# Patient Record
Sex: Male | Born: 1965 | Race: Black or African American | Hispanic: No | Marital: Married | Smoking: Former smoker
Health system: Southern US, Community
[De-identification: ages and names within clinical notes are randomized; demographics above are authoritative.]

## PROBLEM LIST (undated history)

## (undated) DIAGNOSIS — E785 Hyperlipidemia, unspecified: Secondary | ICD-10-CM

## (undated) DIAGNOSIS — J189 Pneumonia, unspecified organism: Secondary | ICD-10-CM

## (undated) DIAGNOSIS — M1990 Unspecified osteoarthritis, unspecified site: Secondary | ICD-10-CM

## (undated) DIAGNOSIS — I509 Heart failure, unspecified: Secondary | ICD-10-CM

## (undated) DIAGNOSIS — Z9289 Personal history of other medical treatment: Secondary | ICD-10-CM

## (undated) DIAGNOSIS — M109 Gout, unspecified: Secondary | ICD-10-CM

## (undated) DIAGNOSIS — R569 Unspecified convulsions: Secondary | ICD-10-CM

## (undated) DIAGNOSIS — D649 Anemia, unspecified: Secondary | ICD-10-CM

## (undated) DIAGNOSIS — S2239XA Fracture of one rib, unspecified side, initial encounter for closed fracture: Secondary | ICD-10-CM

## (undated) DIAGNOSIS — J45909 Unspecified asthma, uncomplicated: Secondary | ICD-10-CM

## (undated) DIAGNOSIS — N186 End stage renal disease: Secondary | ICD-10-CM

## (undated) DIAGNOSIS — A539 Syphilis, unspecified: Secondary | ICD-10-CM

## (undated) DIAGNOSIS — I10 Essential (primary) hypertension: Secondary | ICD-10-CM

## (undated) DIAGNOSIS — A64 Unspecified sexually transmitted disease: Secondary | ICD-10-CM

## (undated) DIAGNOSIS — F419 Anxiety disorder, unspecified: Secondary | ICD-10-CM

## (undated) DIAGNOSIS — Z992 Dependence on renal dialysis: Secondary | ICD-10-CM

## (undated) DIAGNOSIS — J101 Influenza due to other identified influenza virus with other respiratory manifestations: Secondary | ICD-10-CM

## (undated) DIAGNOSIS — Z720 Tobacco use: Secondary | ICD-10-CM

## (undated) DIAGNOSIS — B20 Human immunodeficiency virus [HIV] disease: Secondary | ICD-10-CM

## (undated) HISTORY — DX: Fracture of one rib, unspecified side, initial encounter for closed fracture: S22.39XA

## (undated) HISTORY — DX: Human immunodeficiency virus (HIV) disease: B20

## (undated) HISTORY — DX: Syphilis, unspecified: A53.9

## (undated) HISTORY — DX: Unspecified sexually transmitted disease: A64

## (undated) HISTORY — DX: Unspecified convulsions: R56.9

## (undated) HISTORY — DX: Hyperlipidemia, unspecified: E78.5

## (undated) HISTORY — DX: Essential (primary) hypertension: I10

## (undated) HISTORY — PX: THROMBECTOMY: PRO61

## (undated) HISTORY — DX: Gout, unspecified: M10.9

---
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
## 1995-07-22 DIAGNOSIS — A539 Syphilis, unspecified: Secondary | ICD-10-CM

## 1995-07-22 HISTORY — DX: Syphilis, unspecified: A53.9

## 1996-09-29 ENCOUNTER — Encounter (INDEPENDENT_AMBULATORY_CARE_PROVIDER_SITE_OTHER): Payer: Self-pay | Admitting: *Deleted

## 1996-09-29 LAB — CONVERTED CEMR LAB
CD4 Count: 40 microliters
CD4 T Cell Abs: 40

## 1997-10-27 ENCOUNTER — Encounter: Admission: RE | Admit: 1997-10-27 | Discharge: 1997-10-27 | Payer: Self-pay | Admitting: Internal Medicine

## 1997-12-22 ENCOUNTER — Encounter: Admission: RE | Admit: 1997-12-22 | Discharge: 1997-12-22 | Payer: Self-pay | Admitting: Internal Medicine

## 1997-12-28 ENCOUNTER — Encounter: Admission: RE | Admit: 1997-12-28 | Discharge: 1997-12-28 | Payer: Self-pay | Admitting: Internal Medicine

## 1998-01-04 ENCOUNTER — Encounter: Admission: RE | Admit: 1998-01-04 | Discharge: 1998-01-04 | Payer: Self-pay | Admitting: Internal Medicine

## 1999-02-07 ENCOUNTER — Encounter: Admission: RE | Admit: 1999-02-07 | Discharge: 1999-02-07 | Payer: Self-pay | Admitting: Hematology and Oncology

## 1999-02-07 ENCOUNTER — Ambulatory Visit (HOSPITAL_COMMUNITY): Admission: RE | Admit: 1999-02-07 | Discharge: 1999-02-07 | Payer: Self-pay | Admitting: Hematology and Oncology

## 1999-03-15 ENCOUNTER — Encounter: Admission: RE | Admit: 1999-03-15 | Discharge: 1999-03-15 | Payer: Self-pay | Admitting: Internal Medicine

## 1999-04-10 ENCOUNTER — Ambulatory Visit (HOSPITAL_COMMUNITY): Admission: RE | Admit: 1999-04-10 | Discharge: 1999-04-10 | Payer: Self-pay | Admitting: Hematology and Oncology

## 1999-04-10 ENCOUNTER — Encounter: Admission: RE | Admit: 1999-04-10 | Discharge: 1999-04-10 | Payer: Self-pay | Admitting: *Deleted

## 1999-04-25 ENCOUNTER — Encounter: Admission: RE | Admit: 1999-04-25 | Discharge: 1999-04-25 | Payer: Self-pay | Admitting: Internal Medicine

## 1999-07-05 ENCOUNTER — Encounter: Admission: RE | Admit: 1999-07-05 | Discharge: 1999-07-05 | Payer: Self-pay | Admitting: Internal Medicine

## 1999-07-11 ENCOUNTER — Encounter: Admission: RE | Admit: 1999-07-11 | Discharge: 1999-07-11 | Payer: Self-pay | Admitting: Internal Medicine

## 1999-08-01 ENCOUNTER — Encounter: Admission: RE | Admit: 1999-08-01 | Discharge: 1999-08-01 | Payer: Self-pay | Admitting: Internal Medicine

## 1999-08-01 ENCOUNTER — Ambulatory Visit (HOSPITAL_COMMUNITY): Admission: RE | Admit: 1999-08-01 | Discharge: 1999-08-01 | Payer: Self-pay | Admitting: Internal Medicine

## 2000-01-10 ENCOUNTER — Encounter: Admission: RE | Admit: 2000-01-10 | Discharge: 2000-01-10 | Payer: Self-pay | Admitting: Internal Medicine

## 2000-01-13 ENCOUNTER — Encounter: Admission: RE | Admit: 2000-01-13 | Discharge: 2000-01-13 | Payer: Self-pay | Admitting: Internal Medicine

## 2000-01-15 ENCOUNTER — Ambulatory Visit (HOSPITAL_COMMUNITY): Admission: RE | Admit: 2000-01-15 | Discharge: 2000-01-15 | Payer: Self-pay | Admitting: Internal Medicine

## 2000-01-15 ENCOUNTER — Encounter: Admission: RE | Admit: 2000-01-15 | Discharge: 2000-01-15 | Payer: Self-pay | Admitting: Internal Medicine

## 2000-04-21 ENCOUNTER — Encounter: Admission: RE | Admit: 2000-04-21 | Discharge: 2000-04-21 | Payer: Self-pay | Admitting: Hematology and Oncology

## 2000-04-21 ENCOUNTER — Ambulatory Visit (HOSPITAL_COMMUNITY): Admission: RE | Admit: 2000-04-21 | Discharge: 2000-04-21 | Payer: Self-pay | Admitting: Hematology and Oncology

## 2000-06-29 ENCOUNTER — Encounter: Admission: RE | Admit: 2000-06-29 | Discharge: 2000-06-29 | Payer: Self-pay | Admitting: Internal Medicine

## 2000-09-07 ENCOUNTER — Encounter: Admission: RE | Admit: 2000-09-07 | Discharge: 2000-09-07 | Payer: Self-pay | Admitting: Internal Medicine

## 2000-09-29 ENCOUNTER — Emergency Department (HOSPITAL_COMMUNITY): Admission: EM | Admit: 2000-09-29 | Discharge: 2000-09-29 | Payer: Self-pay | Admitting: Emergency Medicine

## 2000-10-01 ENCOUNTER — Encounter: Admission: RE | Admit: 2000-10-01 | Discharge: 2000-10-01 | Payer: Self-pay | Admitting: Internal Medicine

## 2000-10-01 ENCOUNTER — Inpatient Hospital Stay (HOSPITAL_COMMUNITY): Admission: AD | Admit: 2000-10-01 | Discharge: 2000-10-03 | Payer: Self-pay | Admitting: Internal Medicine

## 2000-10-01 ENCOUNTER — Encounter: Payer: Self-pay | Admitting: Internal Medicine

## 2000-10-26 ENCOUNTER — Encounter: Admission: RE | Admit: 2000-10-26 | Discharge: 2000-10-26 | Payer: Self-pay | Admitting: Internal Medicine

## 2000-12-30 ENCOUNTER — Ambulatory Visit (HOSPITAL_COMMUNITY): Admission: RE | Admit: 2000-12-30 | Discharge: 2000-12-30 | Payer: Self-pay | Admitting: Internal Medicine

## 2000-12-30 ENCOUNTER — Encounter: Admission: RE | Admit: 2000-12-30 | Discharge: 2000-12-30 | Payer: Self-pay | Admitting: Internal Medicine

## 2001-01-29 ENCOUNTER — Encounter: Admission: RE | Admit: 2001-01-29 | Discharge: 2001-01-29 | Payer: Self-pay | Admitting: Internal Medicine

## 2001-04-07 ENCOUNTER — Encounter: Admission: RE | Admit: 2001-04-07 | Discharge: 2001-04-07 | Payer: Self-pay | Admitting: Internal Medicine

## 2001-06-10 ENCOUNTER — Encounter: Admission: RE | Admit: 2001-06-10 | Discharge: 2001-06-10 | Payer: Self-pay | Admitting: Internal Medicine

## 2001-07-02 ENCOUNTER — Encounter: Payer: Self-pay | Admitting: Emergency Medicine

## 2001-07-02 ENCOUNTER — Emergency Department (HOSPITAL_COMMUNITY): Admission: EM | Admit: 2001-07-02 | Discharge: 2001-07-02 | Payer: Self-pay | Admitting: Emergency Medicine

## 2001-09-30 ENCOUNTER — Encounter: Admission: RE | Admit: 2001-09-30 | Discharge: 2001-09-30 | Payer: Self-pay

## 2001-10-21 ENCOUNTER — Encounter: Admission: RE | Admit: 2001-10-21 | Discharge: 2001-10-21 | Payer: Self-pay | Admitting: Internal Medicine

## 2001-10-21 ENCOUNTER — Ambulatory Visit (HOSPITAL_COMMUNITY): Admission: RE | Admit: 2001-10-21 | Discharge: 2001-10-21 | Payer: Self-pay | Admitting: Internal Medicine

## 2001-12-02 ENCOUNTER — Encounter: Admission: RE | Admit: 2001-12-02 | Discharge: 2001-12-02 | Payer: Self-pay | Admitting: Internal Medicine

## 2002-04-15 ENCOUNTER — Encounter: Admission: RE | Admit: 2002-04-15 | Discharge: 2002-04-15 | Payer: Self-pay | Admitting: Internal Medicine

## 2002-04-20 ENCOUNTER — Encounter: Admission: RE | Admit: 2002-04-20 | Discharge: 2002-04-20 | Payer: Self-pay | Admitting: Internal Medicine

## 2002-04-20 ENCOUNTER — Ambulatory Visit (HOSPITAL_COMMUNITY): Admission: RE | Admit: 2002-04-20 | Discharge: 2002-04-20 | Payer: Self-pay | Admitting: Internal Medicine

## 2002-04-20 ENCOUNTER — Encounter: Payer: Self-pay | Admitting: Internal Medicine

## 2002-04-21 ENCOUNTER — Emergency Department (HOSPITAL_COMMUNITY): Admission: EM | Admit: 2002-04-21 | Discharge: 2002-04-21 | Payer: Self-pay | Admitting: Emergency Medicine

## 2002-04-21 ENCOUNTER — Encounter: Payer: Self-pay | Admitting: Emergency Medicine

## 2002-04-27 ENCOUNTER — Encounter: Admission: RE | Admit: 2002-04-27 | Discharge: 2002-04-27 | Payer: Self-pay | Admitting: Internal Medicine

## 2002-06-14 ENCOUNTER — Encounter: Admission: RE | Admit: 2002-06-14 | Discharge: 2002-06-14 | Payer: Self-pay | Admitting: Internal Medicine

## 2002-06-24 ENCOUNTER — Encounter: Admission: RE | Admit: 2002-06-24 | Discharge: 2002-06-24 | Payer: Self-pay | Admitting: Internal Medicine

## 2002-07-06 ENCOUNTER — Emergency Department (HOSPITAL_COMMUNITY): Admission: EM | Admit: 2002-07-06 | Discharge: 2002-07-06 | Payer: Self-pay | Admitting: Plastic Surgery

## 2002-07-28 ENCOUNTER — Encounter: Admission: RE | Admit: 2002-07-28 | Discharge: 2002-07-28 | Payer: Self-pay | Admitting: Internal Medicine

## 2002-08-05 ENCOUNTER — Inpatient Hospital Stay (HOSPITAL_COMMUNITY): Admission: EM | Admit: 2002-08-05 | Discharge: 2002-08-18 | Payer: Self-pay | Admitting: Emergency Medicine

## 2002-08-05 ENCOUNTER — Encounter: Payer: Self-pay | Admitting: Emergency Medicine

## 2002-08-09 ENCOUNTER — Encounter: Payer: Self-pay | Admitting: Infectious Diseases

## 2002-08-10 ENCOUNTER — Encounter: Payer: Self-pay | Admitting: Infectious Diseases

## 2002-08-17 DIAGNOSIS — B20 Human immunodeficiency virus [HIV] disease: Secondary | ICD-10-CM | POA: Insufficient documentation

## 2002-09-15 ENCOUNTER — Encounter: Admission: RE | Admit: 2002-09-15 | Discharge: 2002-09-15 | Payer: Self-pay | Admitting: Internal Medicine

## 2002-12-02 ENCOUNTER — Encounter (INDEPENDENT_AMBULATORY_CARE_PROVIDER_SITE_OTHER): Payer: Self-pay | Admitting: Internal Medicine

## 2002-12-02 ENCOUNTER — Encounter: Admission: RE | Admit: 2002-12-02 | Discharge: 2002-12-02 | Payer: Self-pay | Admitting: Internal Medicine

## 2002-12-22 ENCOUNTER — Encounter: Payer: Self-pay | Admitting: Internal Medicine

## 2002-12-22 ENCOUNTER — Encounter: Admission: RE | Admit: 2002-12-22 | Discharge: 2002-12-22 | Payer: Self-pay | Admitting: Internal Medicine

## 2002-12-22 ENCOUNTER — Ambulatory Visit (HOSPITAL_COMMUNITY): Admission: RE | Admit: 2002-12-22 | Discharge: 2002-12-22 | Payer: Self-pay | Admitting: Internal Medicine

## 2002-12-29 ENCOUNTER — Encounter: Admission: RE | Admit: 2002-12-29 | Discharge: 2002-12-29 | Payer: Self-pay | Admitting: Internal Medicine

## 2003-04-05 ENCOUNTER — Encounter: Admission: RE | Admit: 2003-04-05 | Discharge: 2003-04-05 | Payer: Self-pay | Admitting: Internal Medicine

## 2003-06-29 ENCOUNTER — Ambulatory Visit (HOSPITAL_COMMUNITY): Admission: RE | Admit: 2003-06-29 | Discharge: 2003-06-29 | Payer: Self-pay | Admitting: Internal Medicine

## 2003-06-29 ENCOUNTER — Encounter: Payer: Self-pay | Admitting: Internal Medicine

## 2003-06-29 ENCOUNTER — Encounter: Admission: RE | Admit: 2003-06-29 | Discharge: 2003-06-29 | Payer: Self-pay | Admitting: Internal Medicine

## 2003-11-16 ENCOUNTER — Ambulatory Visit (HOSPITAL_COMMUNITY): Admission: RE | Admit: 2003-11-16 | Discharge: 2003-11-16 | Payer: Self-pay | Admitting: Internal Medicine

## 2003-11-16 ENCOUNTER — Encounter: Admission: RE | Admit: 2003-11-16 | Discharge: 2003-11-16 | Payer: Self-pay | Admitting: Internal Medicine

## 2003-11-23 ENCOUNTER — Encounter: Admission: RE | Admit: 2003-11-23 | Discharge: 2003-11-23 | Payer: Self-pay | Admitting: Internal Medicine

## 2004-02-29 ENCOUNTER — Emergency Department (HOSPITAL_COMMUNITY): Admission: EM | Admit: 2004-02-29 | Discharge: 2004-02-29 | Payer: Self-pay | Admitting: Emergency Medicine

## 2004-06-12 ENCOUNTER — Ambulatory Visit: Payer: Self-pay | Admitting: Internal Medicine

## 2004-11-11 ENCOUNTER — Ambulatory Visit: Payer: Self-pay | Admitting: Internal Medicine

## 2004-11-11 ENCOUNTER — Ambulatory Visit (HOSPITAL_COMMUNITY): Admission: RE | Admit: 2004-11-11 | Discharge: 2004-11-11 | Payer: Self-pay | Admitting: Internal Medicine

## 2004-11-13 ENCOUNTER — Emergency Department (HOSPITAL_COMMUNITY): Admission: EM | Admit: 2004-11-13 | Discharge: 2004-11-13 | Payer: Self-pay | Admitting: Emergency Medicine

## 2005-01-30 ENCOUNTER — Ambulatory Visit: Payer: Self-pay | Admitting: Internal Medicine

## 2005-01-30 ENCOUNTER — Ambulatory Visit (HOSPITAL_COMMUNITY): Admission: RE | Admit: 2005-01-30 | Discharge: 2005-01-30 | Payer: Self-pay | Admitting: Internal Medicine

## 2005-02-13 ENCOUNTER — Ambulatory Visit: Payer: Self-pay | Admitting: Internal Medicine

## 2005-05-07 ENCOUNTER — Ambulatory Visit (HOSPITAL_COMMUNITY): Admission: RE | Admit: 2005-05-07 | Discharge: 2005-05-07 | Payer: Self-pay | Admitting: Internal Medicine

## 2005-05-07 ENCOUNTER — Ambulatory Visit: Payer: Self-pay | Admitting: Internal Medicine

## 2005-05-14 ENCOUNTER — Ambulatory Visit: Payer: Self-pay | Admitting: Internal Medicine

## 2005-08-06 ENCOUNTER — Ambulatory Visit: Payer: Self-pay | Admitting: Internal Medicine

## 2005-08-06 ENCOUNTER — Encounter (INDEPENDENT_AMBULATORY_CARE_PROVIDER_SITE_OTHER): Payer: Self-pay | Admitting: *Deleted

## 2005-08-06 ENCOUNTER — Ambulatory Visit (HOSPITAL_COMMUNITY): Admission: RE | Admit: 2005-08-06 | Discharge: 2005-08-06 | Payer: Self-pay | Admitting: Internal Medicine

## 2005-08-06 LAB — CONVERTED CEMR LAB: CD4 Count: 260 microliters

## 2005-09-03 ENCOUNTER — Ambulatory Visit: Payer: Self-pay | Admitting: Infectious Diseases

## 2005-09-03 ENCOUNTER — Encounter (INDEPENDENT_AMBULATORY_CARE_PROVIDER_SITE_OTHER): Payer: Self-pay | Admitting: *Deleted

## 2005-09-03 LAB — CONVERTED CEMR LAB: CD4 Count: 200 microliters

## 2005-09-09 ENCOUNTER — Emergency Department (HOSPITAL_COMMUNITY): Admission: EM | Admit: 2005-09-09 | Discharge: 2005-09-09 | Payer: Self-pay | Admitting: *Deleted

## 2005-10-13 ENCOUNTER — Emergency Department (HOSPITAL_COMMUNITY): Admission: EM | Admit: 2005-10-13 | Discharge: 2005-10-14 | Payer: Self-pay | Admitting: Emergency Medicine

## 2005-10-20 ENCOUNTER — Ambulatory Visit: Payer: Self-pay | Admitting: Infectious Diseases

## 2005-10-20 ENCOUNTER — Encounter: Admission: RE | Admit: 2005-10-20 | Discharge: 2005-10-20 | Payer: Self-pay | Admitting: Infectious Diseases

## 2005-12-02 ENCOUNTER — Encounter (INDEPENDENT_AMBULATORY_CARE_PROVIDER_SITE_OTHER): Payer: Self-pay | Admitting: *Deleted

## 2005-12-02 ENCOUNTER — Encounter: Admission: RE | Admit: 2005-12-02 | Discharge: 2005-12-02 | Payer: Self-pay | Admitting: Infectious Diseases

## 2005-12-02 ENCOUNTER — Ambulatory Visit: Payer: Self-pay | Admitting: Infectious Diseases

## 2006-01-22 ENCOUNTER — Ambulatory Visit (HOSPITAL_COMMUNITY): Admission: RE | Admit: 2006-01-22 | Discharge: 2006-01-22 | Payer: Self-pay | Admitting: Urology

## 2006-01-22 ENCOUNTER — Encounter (INDEPENDENT_AMBULATORY_CARE_PROVIDER_SITE_OTHER): Payer: Self-pay | Admitting: Specialist

## 2006-04-14 ENCOUNTER — Emergency Department (HOSPITAL_COMMUNITY): Admission: EM | Admit: 2006-04-14 | Discharge: 2006-04-14 | Payer: Self-pay | Admitting: *Deleted

## 2006-05-08 DIAGNOSIS — B20 Human immunodeficiency virus [HIV] disease: Secondary | ICD-10-CM | POA: Insufficient documentation

## 2006-05-08 DIAGNOSIS — I10 Essential (primary) hypertension: Secondary | ICD-10-CM

## 2006-05-08 DIAGNOSIS — G609 Hereditary and idiopathic neuropathy, unspecified: Secondary | ICD-10-CM | POA: Insufficient documentation

## 2006-05-08 DIAGNOSIS — A539 Syphilis, unspecified: Secondary | ICD-10-CM

## 2006-05-08 DIAGNOSIS — A150 Tuberculosis of lung: Secondary | ICD-10-CM | POA: Insufficient documentation

## 2006-05-08 DIAGNOSIS — M19019 Primary osteoarthritis, unspecified shoulder: Secondary | ICD-10-CM | POA: Insufficient documentation

## 2006-05-08 DIAGNOSIS — R569 Unspecified convulsions: Secondary | ICD-10-CM

## 2006-05-08 HISTORY — DX: Essential (primary) hypertension: I10

## 2006-05-26 ENCOUNTER — Encounter (INDEPENDENT_AMBULATORY_CARE_PROVIDER_SITE_OTHER): Payer: Self-pay | Admitting: *Deleted

## 2006-05-26 ENCOUNTER — Encounter: Admission: RE | Admit: 2006-05-26 | Discharge: 2006-05-26 | Payer: Self-pay | Admitting: Internal Medicine

## 2006-05-26 ENCOUNTER — Ambulatory Visit: Payer: Self-pay | Admitting: Internal Medicine

## 2006-05-26 ENCOUNTER — Encounter: Payer: Self-pay | Admitting: Infectious Diseases

## 2006-05-26 LAB — CONVERTED CEMR LAB
AST: 81 units/L — ABNORMAL HIGH (ref 0–37)
Alkaline Phosphatase: 198 units/L — ABNORMAL HIGH (ref 39–117)
CO2: 26 meq/L (ref 19–32)
Chloride: 102 meq/L (ref 96–112)
Creatinine, Ser: 1.4 mg/dL (ref 0.40–1.50)
Glucose, Bld: 100 mg/dL — ABNORMAL HIGH (ref 70–99)
HCT: 43.1 % (ref 41.0–49.0)
HIV-1 RNA Quant, Log: <1.7 (ref <1.70)
MCHC: 34.8 g/dL (ref 33.1–35.4)
Platelets: 281 10*3/uL (ref 152–374)
Potassium: 4.5 meq/L (ref 3.5–5.3)
RDW: 13.4 % (ref 11.5–15.3)
Sodium: 138 meq/L (ref 135–145)
Total Bilirubin: 0.7 mg/dL (ref 0.3–1.2)

## 2006-08-06 DIAGNOSIS — A630 Anogenital (venereal) warts: Secondary | ICD-10-CM | POA: Insufficient documentation

## 2006-08-17 ENCOUNTER — Encounter: Payer: Self-pay | Admitting: Infectious Diseases

## 2006-09-14 ENCOUNTER — Encounter (INDEPENDENT_AMBULATORY_CARE_PROVIDER_SITE_OTHER): Payer: Self-pay | Admitting: *Deleted

## 2006-09-14 LAB — CONVERTED CEMR LAB

## 2006-09-24 ENCOUNTER — Ambulatory Visit: Payer: Self-pay | Admitting: Internal Medicine

## 2006-09-24 ENCOUNTER — Encounter: Payer: Self-pay | Admitting: Infectious Diseases

## 2006-09-24 ENCOUNTER — Encounter: Admission: RE | Admit: 2006-09-24 | Discharge: 2006-09-24 | Payer: Self-pay | Admitting: Hospitalist

## 2006-09-24 ENCOUNTER — Encounter (INDEPENDENT_AMBULATORY_CARE_PROVIDER_SITE_OTHER): Payer: Self-pay | Admitting: Internal Medicine

## 2006-09-24 LAB — CONVERTED CEMR LAB
AST: 26 units/L (ref 0–37)
Albumin: 4.4 g/dL (ref 3.5–5.2)
Alkaline Phosphatase: 149 units/L — ABNORMAL HIGH (ref 39–117)
Basophils Relative: 1 % (ref 0–1)
Chloride: 104 meq/L (ref 96–112)
Glucose, Bld: 95 mg/dL (ref 70–99)
HIV 1 RNA Quant: 143 copies/mL — ABNORMAL HIGH (ref <50)
HIV-1 RNA Quant, Log: 2.16 — ABNORMAL HIGH (ref <1.70)
Hemoglobin: 14.3 g/dL (ref 13.0–17.0)
Lymphocytes Relative: 32 % (ref 12–46)
Monocytes Absolute: 0.6 10*3/uL (ref 0.2–0.7)
Monocytes Relative: 9 % (ref 3–11)
Neutro Abs: 3.5 10*3/uL (ref 1.7–7.7)
Neutrophils Relative %: 56 % (ref 43–77)
Platelets: 200 10*3/uL (ref 150–400)
Potassium: 4.4 meq/L (ref 3.5–5.3)
RBC: 4.06 M/uL — ABNORMAL LOW (ref 4.22–5.81)
RDW: 13.4 % (ref 11.5–14.0)
Sodium: 137 meq/L (ref 135–145)

## 2006-09-27 ENCOUNTER — Encounter (INDEPENDENT_AMBULATORY_CARE_PROVIDER_SITE_OTHER): Payer: Self-pay | Admitting: *Deleted

## 2006-09-27 DIAGNOSIS — N182 Chronic kidney disease, stage 2 (mild): Secondary | ICD-10-CM | POA: Insufficient documentation

## 2006-10-01 ENCOUNTER — Encounter (INDEPENDENT_AMBULATORY_CARE_PROVIDER_SITE_OTHER): Payer: Self-pay | Admitting: Internal Medicine

## 2006-10-01 ENCOUNTER — Ambulatory Visit: Payer: Self-pay | Admitting: *Deleted

## 2006-10-01 LAB — CONVERTED CEMR LAB
BUN: 25 mg/dL — ABNORMAL HIGH (ref 6–23)
Creatinine, Ser: 1.49 mg/dL (ref 0.40–1.50)
Glucose, Bld: 102 mg/dL — ABNORMAL HIGH (ref 70–99)
Potassium: 4.5 meq/L (ref 3.5–5.3)
Sodium: 137 meq/L (ref 135–145)

## 2006-10-28 ENCOUNTER — Ambulatory Visit: Payer: Self-pay | Admitting: Infectious Diseases

## 2007-01-27 ENCOUNTER — Telehealth: Payer: Self-pay | Admitting: *Deleted

## 2007-03-23 ENCOUNTER — Telehealth: Payer: Self-pay | Admitting: *Deleted

## 2007-03-23 ENCOUNTER — Ambulatory Visit (HOSPITAL_COMMUNITY): Admission: RE | Admit: 2007-03-23 | Discharge: 2007-03-23 | Payer: Self-pay | Admitting: Internal Medicine

## 2007-03-23 ENCOUNTER — Ambulatory Visit: Payer: Self-pay | Admitting: Internal Medicine

## 2007-03-23 ENCOUNTER — Encounter (INDEPENDENT_AMBULATORY_CARE_PROVIDER_SITE_OTHER): Payer: Self-pay | Admitting: Internal Medicine

## 2007-03-24 ENCOUNTER — Encounter (INDEPENDENT_AMBULATORY_CARE_PROVIDER_SITE_OTHER): Payer: Self-pay | Admitting: Internal Medicine

## 2007-03-24 LAB — CONVERTED CEMR LAB
ALT: 25 units/L (ref 0–53)
AST: 20 units/L (ref 0–37)
Alkaline Phosphatase: 140 units/L — ABNORMAL HIGH (ref 39–117)
Chloride: 102 meq/L (ref 96–112)
Creatinine, Ser: 1.49 mg/dL (ref 0.40–1.50)
Glucose, Bld: 86 mg/dL (ref 70–99)
MCHC: 34 g/dL (ref 30.0–36.0)
Platelets: 181 10*3/uL (ref 150–400)
Sodium: 138 meq/L (ref 135–145)
Total Bilirubin: 0.4 mg/dL (ref 0.3–1.2)
Total Protein: 7.1 g/dL (ref 6.0–8.3)
WBC: 6.8 10*3/uL (ref 4.0–10.5)

## 2007-04-20 ENCOUNTER — Telehealth: Payer: Self-pay | Admitting: *Deleted

## 2007-06-01 ENCOUNTER — Telehealth (INDEPENDENT_AMBULATORY_CARE_PROVIDER_SITE_OTHER): Payer: Self-pay | Admitting: *Deleted

## 2007-06-03 ENCOUNTER — Encounter (INDEPENDENT_AMBULATORY_CARE_PROVIDER_SITE_OTHER): Payer: Self-pay | Admitting: Internal Medicine

## 2007-06-10 ENCOUNTER — Ambulatory Visit: Payer: Self-pay | Admitting: Infectious Diseases

## 2007-06-10 ENCOUNTER — Encounter: Admission: RE | Admit: 2007-06-10 | Discharge: 2007-06-10 | Payer: Self-pay | Admitting: Infectious Diseases

## 2007-06-10 LAB — CONVERTED CEMR LAB
HIV 1 RNA Quant: 144 copies/mL — ABNORMAL HIGH (ref <50)
HIV-1 RNA Quant, Log: 2.16 — ABNORMAL HIGH (ref <1.70)

## 2007-06-11 ENCOUNTER — Encounter: Payer: Self-pay | Admitting: Infectious Diseases

## 2007-06-11 LAB — CONVERTED CEMR LAB: Hep A Total Ab: NEGATIVE

## 2007-06-14 LAB — CONVERTED CEMR LAB
ALT: 28 units/L (ref 0–53)
Albumin: 3.9 g/dL (ref 3.5–5.2)
Basophils Absolute: 0 10*3/uL (ref 0.0–0.1)
Calcium: 9.2 mg/dL (ref 8.4–10.5)
Chloride: 102 meq/L (ref 96–112)
Creatinine, Ser: 1.34 mg/dL (ref 0.40–1.50)
Eosinophils Relative: 3 % (ref 0–5)
Glucose, Bld: 94 mg/dL (ref 70–99)
Hemoglobin, Urine: NEGATIVE
Leukocytes, UA: NEGATIVE
MCV: 102.1 fL — ABNORMAL HIGH (ref 78.0–100.0)
Monocytes Relative: 9 % (ref 3–12)
Potassium: 4.1 meq/L (ref 3.5–5.3)
Protein, ur: >300 mg/dL — AB
RDW: 13.3 % (ref 11.5–15.5)
Specific Gravity, Urine: 1.015 (ref 1.005–1.03)
Triglycerides: 270 mg/dL — ABNORMAL HIGH (ref <150)
VLDL: 54 mg/dL — ABNORMAL HIGH (ref 0–40)
pH: 6.5 (ref 5.0–8.0)

## 2007-06-29 ENCOUNTER — Encounter (INDEPENDENT_AMBULATORY_CARE_PROVIDER_SITE_OTHER): Payer: Self-pay | Admitting: *Deleted

## 2007-06-30 ENCOUNTER — Ambulatory Visit: Payer: Self-pay | Admitting: Infectious Diseases

## 2007-07-21 ENCOUNTER — Ambulatory Visit: Payer: Self-pay | Admitting: Hospitalist

## 2007-07-21 ENCOUNTER — Encounter (INDEPENDENT_AMBULATORY_CARE_PROVIDER_SITE_OTHER): Payer: Self-pay | Admitting: *Deleted

## 2007-07-23 LAB — CONVERTED CEMR LAB: Phenytoin Lvl: 2.4 ug/mL — ABNORMAL LOW (ref 10.0–20.0)

## 2007-10-06 ENCOUNTER — Telehealth (INDEPENDENT_AMBULATORY_CARE_PROVIDER_SITE_OTHER): Payer: Self-pay | Admitting: *Deleted

## 2008-05-17 ENCOUNTER — Ambulatory Visit: Payer: Self-pay | Admitting: Internal Medicine

## 2008-06-08 ENCOUNTER — Ambulatory Visit: Payer: Self-pay | Admitting: Internal Medicine

## 2008-06-08 ENCOUNTER — Encounter: Payer: Self-pay | Admitting: Infectious Diseases

## 2008-06-08 ENCOUNTER — Encounter (INDEPENDENT_AMBULATORY_CARE_PROVIDER_SITE_OTHER): Payer: Self-pay | Admitting: *Deleted

## 2008-06-08 DIAGNOSIS — B351 Tinea unguium: Secondary | ICD-10-CM

## 2008-06-08 DIAGNOSIS — F528 Other sexual dysfunction not due to a substance or known physiological condition: Secondary | ICD-10-CM | POA: Insufficient documentation

## 2008-06-08 LAB — CONVERTED CEMR LAB
Alkaline Phosphatase: 126 units/L — ABNORMAL HIGH (ref 39–117)
BUN: 21 mg/dL (ref 6–23)
CO2: 22 meq/L (ref 19–32)
Calcium: 9.7 mg/dL (ref 8.4–10.5)
Cholesterol: 172 mg/dL (ref 0–200)
Eosinophils Absolute: 0.2 10*3/uL (ref 0.0–0.7)
Glucose, Bld: 88 mg/dL (ref 70–99)
HCT: 45.4 % (ref 39.0–52.0)
HIV 1 RNA Quant: 141 copies/mL — ABNORMAL HIGH (ref <48)
HIV-1 RNA Quant, Log: 2.15 — ABNORMAL HIGH (ref <1.68)
Hemoglobin: 15.9 g/dL (ref 13.0–17.0)
LDL Cholesterol: 81 mg/dL (ref 0–99)
Lymphocytes Relative: 30 % (ref 12–46)
Lymphs Abs: 2.3 10*3/uL (ref 0.7–4.0)
MCV: 101.1 fL — ABNORMAL HIGH (ref 78.0–100.0)
Monocytes Absolute: 0.7 10*3/uL (ref 0.1–1.0)
RBC: 4.49 M/uL (ref 4.22–5.81)
RDW: 14.5 % (ref 11.5–15.5)
Total Protein: 7.6 g/dL (ref 6.0–8.3)

## 2008-08-23 ENCOUNTER — Encounter (INDEPENDENT_AMBULATORY_CARE_PROVIDER_SITE_OTHER): Payer: Self-pay | Admitting: *Deleted

## 2008-08-23 ENCOUNTER — Ambulatory Visit: Payer: Self-pay | Admitting: Infectious Diseases

## 2008-08-23 LAB — CONVERTED CEMR LAB
Albumin: 4.5 g/dL (ref 3.5–5.2)
Basophils Absolute: 0 10*3/uL (ref 0.0–0.1)
Basophils Relative: 0 % (ref 0–1)
CO2: 22 meq/L (ref 19–32)
Creatinine, Ser: 1.61 mg/dL — ABNORMAL HIGH (ref 0.40–1.50)
Eosinophils Relative: 3 % (ref 0–5)
Glucose, Bld: 88 mg/dL (ref 70–99)
HCT: 45.4 % (ref 39.0–52.0)
HDL: 41 mg/dL (ref >39)
Ketones, ur: NEGATIVE mg/dL
LDL Cholesterol: 50 mg/dL (ref 0–99)
Leukocytes, UA: NEGATIVE
Lymphs Abs: 2.4 10*3/uL (ref 0.7–4.0)
MCHC: 33.9 g/dL (ref 30.0–36.0)
MCV: 103.2 fL — ABNORMAL HIGH (ref 78.0–100.0)
Monocytes Relative: 9 % (ref 3–12)
Neutro Abs: 5.2 10*3/uL (ref 1.7–7.7)
Neutrophils Relative %: 60 % (ref 43–77)
Nitrite: NEGATIVE
Potassium: 4.4 meq/L (ref 3.5–5.3)
RBC: 4.4 M/uL (ref 4.22–5.81)
RDW: 13.5 % (ref 11.5–15.5)
Total CHOL/HDL Ratio: 4
Total Protein: 7.7 g/dL (ref 6.0–8.3)
Triglycerides: 355 mg/dL — ABNORMAL HIGH (ref <150)
VLDL: 71 mg/dL — ABNORMAL HIGH (ref 0–40)
WBC: 8.7 10*3/uL (ref 4.0–10.5)

## 2008-10-06 ENCOUNTER — Ambulatory Visit: Payer: Self-pay | Admitting: Internal Medicine

## 2008-10-06 ENCOUNTER — Ambulatory Visit (HOSPITAL_COMMUNITY): Admission: RE | Admit: 2008-10-06 | Discharge: 2008-10-06 | Payer: Self-pay | Admitting: *Deleted

## 2008-10-06 DIAGNOSIS — R300 Dysuria: Secondary | ICD-10-CM

## 2008-10-06 DIAGNOSIS — M25569 Pain in unspecified knee: Secondary | ICD-10-CM

## 2008-10-07 ENCOUNTER — Encounter (INDEPENDENT_AMBULATORY_CARE_PROVIDER_SITE_OTHER): Payer: Self-pay | Admitting: *Deleted

## 2008-10-10 ENCOUNTER — Encounter (INDEPENDENT_AMBULATORY_CARE_PROVIDER_SITE_OTHER): Payer: Self-pay | Admitting: *Deleted

## 2008-10-10 LAB — CONVERTED CEMR LAB: Chlamydia, Swab/Urine, PCR: NEGATIVE

## 2008-10-24 ENCOUNTER — Telehealth (INDEPENDENT_AMBULATORY_CARE_PROVIDER_SITE_OTHER): Payer: Self-pay | Admitting: *Deleted

## 2008-11-02 ENCOUNTER — Ambulatory Visit: Payer: Self-pay | Admitting: Internal Medicine

## 2009-01-18 DIAGNOSIS — S2249XA Multiple fractures of ribs, unspecified side, initial encounter for closed fracture: Secondary | ICD-10-CM

## 2009-01-18 HISTORY — DX: Multiple fractures of ribs, unspecified side, initial encounter for closed fracture: S22.49XA

## 2009-01-25 ENCOUNTER — Ambulatory Visit (HOSPITAL_COMMUNITY): Admission: RE | Admit: 2009-01-25 | Discharge: 2009-01-25 | Payer: Self-pay | Admitting: Internal Medicine

## 2009-01-25 ENCOUNTER — Ambulatory Visit: Payer: Self-pay | Admitting: Internal Medicine

## 2009-01-25 DIAGNOSIS — S2249XA Multiple fractures of ribs, unspecified side, initial encounter for closed fracture: Secondary | ICD-10-CM | POA: Insufficient documentation

## 2009-02-14 ENCOUNTER — Ambulatory Visit: Payer: Self-pay | Admitting: Internal Medicine

## 2009-03-14 ENCOUNTER — Ambulatory Visit: Payer: Self-pay | Admitting: Internal Medicine

## 2009-03-18 LAB — CONVERTED CEMR LAB
Chloride: 102 meq/L (ref 96–112)
Creatinine, Ser: 1.66 mg/dL — ABNORMAL HIGH (ref 0.40–1.50)
Phenytoin Lvl: 4.4 ug/mL — ABNORMAL LOW (ref 10.0–20.0)
Potassium: 4.3 meq/L (ref 3.5–5.3)
Sodium: 137 meq/L (ref 135–145)

## 2009-04-02 ENCOUNTER — Emergency Department (HOSPITAL_COMMUNITY): Admission: EM | Admit: 2009-04-02 | Discharge: 2009-04-02 | Payer: Self-pay | Admitting: Emergency Medicine

## 2009-04-05 ENCOUNTER — Encounter (INDEPENDENT_AMBULATORY_CARE_PROVIDER_SITE_OTHER): Payer: Self-pay | Admitting: Internal Medicine

## 2009-05-04 ENCOUNTER — Ambulatory Visit (HOSPITAL_COMMUNITY): Admission: RE | Admit: 2009-05-04 | Discharge: 2009-05-04 | Payer: Self-pay | Admitting: Internal Medicine

## 2009-05-04 ENCOUNTER — Ambulatory Visit: Payer: Self-pay | Admitting: Internal Medicine

## 2009-05-16 ENCOUNTER — Ambulatory Visit: Payer: Self-pay | Admitting: Internal Medicine

## 2009-05-17 ENCOUNTER — Encounter (INDEPENDENT_AMBULATORY_CARE_PROVIDER_SITE_OTHER): Payer: Self-pay | Admitting: Internal Medicine

## 2009-05-29 ENCOUNTER — Encounter (INDEPENDENT_AMBULATORY_CARE_PROVIDER_SITE_OTHER): Payer: Self-pay | Admitting: Internal Medicine

## 2009-07-03 ENCOUNTER — Encounter: Payer: Self-pay | Admitting: Infectious Diseases

## 2009-07-03 ENCOUNTER — Ambulatory Visit: Payer: Self-pay | Admitting: Internal Medicine

## 2009-07-03 LAB — CONVERTED CEMR LAB
Albumin: 4.6 g/dL (ref 3.5–5.2)
Alkaline Phosphatase: 189 units/L — ABNORMAL HIGH (ref 39–117)
BUN: 23 mg/dL (ref 6–23)
Basophils Absolute: 0 10*3/uL (ref 0.0–0.1)
Basophils Relative: 0 % (ref 0–1)
CO2: 23 meq/L (ref 19–32)
Creatinine, Ser: 1.5 mg/dL (ref 0.40–1.50)
Eosinophils Relative: 3 % (ref 0–5)
Glucose, Bld: 100 mg/dL — ABNORMAL HIGH (ref 70–99)
Hemoglobin: 16.3 g/dL (ref 13.0–17.0)
Lymphocytes Relative: 18 % (ref 12–46)
Lymphs Abs: 1.6 10*3/uL (ref 0.7–4.0)
MCHC: 35.7 g/dL (ref 30.0–36.0)
Monocytes Relative: 10 % (ref 3–12)
Neutro Abs: 6 10*3/uL (ref 1.7–7.7)
Neutrophils Relative %: 69 % (ref 43–77)
Platelets: 223 10*3/uL (ref 150–400)
RBC: 4.5 M/uL (ref 4.22–5.81)
RDW: 14.6 % (ref 11.5–15.5)
Total Bilirubin: 0.4 mg/dL (ref 0.3–1.2)

## 2009-07-10 ENCOUNTER — Encounter (INDEPENDENT_AMBULATORY_CARE_PROVIDER_SITE_OTHER): Payer: Self-pay | Admitting: Internal Medicine

## 2009-07-25 ENCOUNTER — Ambulatory Visit: Payer: Self-pay | Admitting: Infectious Diseases

## 2009-07-31 ENCOUNTER — Encounter (INDEPENDENT_AMBULATORY_CARE_PROVIDER_SITE_OTHER): Payer: Self-pay | Admitting: Internal Medicine

## 2009-08-13 DIAGNOSIS — M109 Gout, unspecified: Secondary | ICD-10-CM

## 2009-08-13 HISTORY — DX: Gout, unspecified: M10.9

## 2009-09-15 ENCOUNTER — Emergency Department (HOSPITAL_COMMUNITY): Admission: EM | Admit: 2009-09-15 | Discharge: 2009-09-15 | Payer: Self-pay | Admitting: Emergency Medicine

## 2009-10-23 ENCOUNTER — Ambulatory Visit: Payer: Self-pay | Admitting: Internal Medicine

## 2009-10-23 ENCOUNTER — Encounter (INDEPENDENT_AMBULATORY_CARE_PROVIDER_SITE_OTHER): Payer: Self-pay | Admitting: Internal Medicine

## 2009-10-23 LAB — CONVERTED CEMR LAB: HIV 1 RNA Quant: <48 copies/mL (ref <48)

## 2009-10-30 LAB — CONVERTED CEMR LAB
ALT: 31 units/L (ref 0–53)
Basophils Absolute: 0 10*3/uL (ref 0.0–0.1)
CO2: 26 meq/L (ref 19–32)
Creatinine, Ser: 1.68 mg/dL — ABNORMAL HIGH (ref 0.40–1.50)
HCT: 44 % (ref 39.0–52.0)
HDL: 32 mg/dL — ABNORMAL LOW (ref >39)
Hemoglobin: 15.1 g/dL (ref 13.0–17.0)
LDL Cholesterol: 16 mg/dL (ref 0–99)
MCHC: 34.3 g/dL (ref 30.0–36.0)
Neutrophils Relative %: 53 % (ref 43–77)
RDW: 13.1 % (ref 11.5–15.5)
Total Bilirubin: 0.6 mg/dL (ref 0.3–1.2)
Total CHOL/HDL Ratio: 4
Total Protein: 6.7 g/dL (ref 6.0–8.3)
VLDL: 79 mg/dL — ABNORMAL HIGH (ref 0–40)
WBC: 6.6 10*3/uL (ref 4.0–10.5)

## 2009-11-01 ENCOUNTER — Ambulatory Visit: Payer: Self-pay | Admitting: Internal Medicine

## 2009-11-01 DIAGNOSIS — R748 Abnormal levels of other serum enzymes: Secondary | ICD-10-CM | POA: Insufficient documentation

## 2009-11-01 DIAGNOSIS — E781 Pure hyperglyceridemia: Secondary | ICD-10-CM

## 2009-11-06 LAB — CONVERTED CEMR LAB
Bilirubin Urine: NEGATIVE
Casts: NONE SEEN /lpf
Creatinine, Urine: 136.8 mg/dL
Crystals: NONE SEEN
GGT: 655 units/L — ABNORMAL HIGH (ref 7–51)
Hemoglobin, Urine: NEGATIVE
Nitrite: NEGATIVE
Phenytoin Lvl: 5.5 ug/mL — ABNORMAL LOW (ref 10.0–20.0)
RBC / HPF: NONE SEEN (ref <3)
Specific Gravity, Urine: 1.018 (ref 1.005–1.0)
Urine Glucose: NEGATIVE mg/dL

## 2009-11-07 ENCOUNTER — Ambulatory Visit: Payer: Self-pay | Admitting: Infectious Diseases

## 2009-11-08 ENCOUNTER — Emergency Department (HOSPITAL_COMMUNITY): Admission: EM | Admit: 2009-11-08 | Discharge: 2009-11-08 | Payer: Self-pay | Admitting: Emergency Medicine

## 2009-11-13 ENCOUNTER — Encounter (INDEPENDENT_AMBULATORY_CARE_PROVIDER_SITE_OTHER): Payer: Self-pay | Admitting: Internal Medicine

## 2009-11-22 ENCOUNTER — Telehealth (INDEPENDENT_AMBULATORY_CARE_PROVIDER_SITE_OTHER): Payer: Self-pay | Admitting: Internal Medicine

## 2009-11-27 ENCOUNTER — Encounter (INDEPENDENT_AMBULATORY_CARE_PROVIDER_SITE_OTHER): Payer: Self-pay | Admitting: Internal Medicine

## 2009-12-05 ENCOUNTER — Encounter (INDEPENDENT_AMBULATORY_CARE_PROVIDER_SITE_OTHER): Payer: Self-pay | Admitting: Internal Medicine

## 2010-01-29 ENCOUNTER — Ambulatory Visit: Payer: Self-pay | Admitting: Internal Medicine

## 2010-02-20 ENCOUNTER — Telehealth: Payer: Self-pay | Admitting: Internal Medicine

## 2010-03-19 ENCOUNTER — Emergency Department (HOSPITAL_COMMUNITY): Admission: EM | Admit: 2010-03-19 | Discharge: 2010-03-19 | Payer: Self-pay | Admitting: Family Medicine

## 2010-06-10 ENCOUNTER — Encounter (INDEPENDENT_AMBULATORY_CARE_PROVIDER_SITE_OTHER): Payer: Self-pay | Admitting: *Deleted

## 2010-06-24 ENCOUNTER — Ambulatory Visit: Payer: Self-pay

## 2010-08-02 ENCOUNTER — Telehealth: Payer: Self-pay | Admitting: Internal Medicine

## 2010-08-08 ENCOUNTER — Telehealth: Payer: Self-pay | Admitting: Internal Medicine

## 2010-08-12 ENCOUNTER — Encounter (INDEPENDENT_AMBULATORY_CARE_PROVIDER_SITE_OTHER): Payer: Self-pay | Admitting: *Deleted

## 2010-08-20 NOTE — Letter (Signed)
Summary: [facility]  [facility]   Imported By: Garlan Fillers 11/27/2009 14:33:04  _____________________________________________________________________  External Attachment:    Type:   Image     Comment:   External Document

## 2010-08-20 NOTE — Letter (Signed)
Summary: [facility]   Imported By: Garlan Fillers 12/18/2009 12:03:19  _____________________________________________________________________  External Attachment:    Type:   Image     Comment:   External Document  Appended Document: [facility]    Clinical Lists Changes  Medications: Added new medication of DICLOFENAC SODIUM 75 MG TBEC (DICLOFENAC SODIUM) Take 1 tablet by mouth two times a day

## 2010-08-20 NOTE — Consult Note (Signed)
Summary: [facility]   Imported By: Bonner Puna 08/10/2009 16:45:47  _____________________________________________________________________  External Attachment:    Type:   Image     Comment:   External Document  Appended Document: [facility]    Clinical Lists Changes  Problems: Added new problem of GOUT, UNSPECIFIED (ICD-274.9) Medications: Added new medication of ALLOPURINOL 100 MG TABS (ALLOPURINOL) Take 1 tablet by mouth once a day

## 2010-08-20 NOTE — Letter (Signed)
Summary: [facility]  [facility]   Imported By: Garlan Fillers 12/05/2009 11:07:33  _____________________________________________________________________  External Attachment:    Type:   Image     Comment:   External Document

## 2010-08-20 NOTE — Miscellaneous (Signed)
  Clinical Lists Changes  Observations: Added new observation of YEARAIDSPOS: 1998  (06/10/2010 13:27) Added new observation of HIV STATUS: CDC-defined AIDS  (06/10/2010 13:27)

## 2010-08-20 NOTE — Assessment & Plan Note (Signed)
Summary: ACUTE-TO HAVE REPEAT LABS AND DISCUSS ABNORMAL LABS PER DR [place].Marland KitchenMarland Kitchen   Vital Signs:  Patient profile:   45 year old male Height:      71 inches (180.34 cm) Weight:      228.0 pounds (103.64 kg) BMI:     31.91 Temp:     98.3 degrees F (36.83 degrees C) oral Pulse rate:   86 / minute BP sitting:   130 / 84  (left arm)  Vitals Entered By: Hilda Blades Ditzler RN (November 01, 2009 8:46 AM) Is Patient Diabetic? No Pain Assessment Patient in pain? no      Nutritional Status BMI of > 30 = obese Nutritional Status Detail appetite good  Have you ever been in a relationship where you felt threatened, hurt or afraid?denies   Does patient need assistance? Functional Status Self care Ambulation Normal Comments Discuss abnormal labs.   Primary Care Provider:  Junius Finner  MD   History of Present Illness: Darrell Johnson is pleasant 45 yo male with HIV, HTN, seizure disorder who presents today to [facility] for regular follow up, was told to come in to discuss his lab tests results. At this time he reports no concerns and tells me he is doing well. He is compliant with his meds and has no known side effects. He denies and abdominal or urinary concerns (no blood in urine or stool), no changes in weight or appetite, no episodes of chest pain, SOB, palpitations, no recent sicknesses or hospitalizations. Also senies any respiratory problems, congestions, no headaches, weakness, visual changes, no sick contacts or exposures. He does report drinking occasionaly different types of alcoholic beverages (several times per week), he did cut down.   Depression History:      The patient denies a depressed mood most of the day and a diminished interest in his usual daily activities.  The patient denies significant weight loss, significant weight gain, insomnia, hypersomnia, psychomotor agitation, psychomotor retardation, fatigue (loss of energy), feelings of worthlessness (guilt), impaired concentration  (indecisiveness), and recurrent thoughts of death or suicide.        The patient denies that he feels like life is not worth living, denies that he wishes that he were dead, and denies that he has thought about ending his life.         Preventive Screening-Counseling & Management  Alcohol-Tobacco     Alcohol drinks/day: <1     Alcohol type: liquor-occassionally on weekends     Smoking Status: current     Smoking Cessation Counseling: yes     Packs/Day: 6 cigs per day     Year Started: 1980's     Passive Smoke Exposure: no  Caffeine-Diet-Exercise     Caffeine use/day: 1     Does Patient Exercise: no     Type of exercise: walking     Times/week: <3  Problems Prior to Update: 1)  Gout, Unspecified  (ICD-274.9) 2)  Fracture, Ribs, Multiple  (ICD-807.09) 3)  Knee Pain, Bilateral  (ICD-719.46) 4)  Dysuria  (ICD-788.1) 5)  Need Prophylactic Vaccination&inoculation Flu  (ICD-V04.81) 6)  Onychomycosis, Toenails  (ICD-110.1) 7)  Erectile Dysfunction  (ICD-302.72) 8)  Macrocytic Anemia  (ICD-281.9) 9)  Kidney Disease, Chronic, Stage II  (ICD-585.2) 10)  Aftercare, Long-term Use, Medications Nec  (ICD-V58.69) 11)  Syphilis Nos  (ICD-097.9) 12)  Condyloma Acuminata, Penis  (ICD-078.11) 13)  Pulmonary Tuberculosis  (ICD-011.90) 14)  Seizure Disorder  (ICD-780.39) 15)  Tobacco Use  (ICD-305.1) 16)  Peripheral Neuropathy  (  ICD-356.9) 17)  Hypertension  (ICD-401.9) 18)  HIV Disease  (ICD-042) 19)  Allergic Rhinitis  (ICD-477.9) 20)  Arthritis, Right Shoulder  (ICD-716.91)  Medications Prior to Update: 1)  Dilantin 100 Mg Caps (Phenytoin Sodium Extended) .... Take Four Capsules By Mouth Daily 2)  Allegra 180 Mg Tabs (Fexofenadine Hcl) .... Take 1 Tablet By Mouth Once A Day As Needed 3)  Diovan 320 Mg Tabs (Valsartan) .... Take 1 Tablet By Mouth Once A Day 4)  Flonase 50 Mcg/act Susp (Fluticasone Propionate) .... Inhale 2 Puffs Two Times A Day 5)  Kaletra 200-50 Mg Tabs  (Lopinavir-Ritonavir) .... Take Two Tablets Twice Daily 6)  Combivir 150-300 Mg Tabs (Lamivudine-Zidovudine) .... Take One Tablet Twice Daily 7)  Viread 300 Mg Tabs (Tenofovir Disoproxil Fumarate) .... Take One Tablet Daily 8)  Hydrochlorothiazide 25 Mg  Tabs (Hydrochlorothiazide) .... Take 1 Tablet By Mouth Once A Day 9)  Percocet 7.5-325 Mg Tabs (Oxycodone-Acetaminophen) .... Take One Tablet Every Six Hours As Needed For Pain. 10)  Norvasc 5 Mg Tabs (Amlodipine Besylate) .... Take 1 Tablet By Mouth Once A Day 11)  Diclofenac Sodium 75 Mg Tbec (Diclofenac Sodium) .... Take 1 Tablet By Mouth Once A Day or Prn For Foot Pain,  With Food. 12)  Lortab 5 5-500 Mg Tabs (Hydrocodone-Acetaminophen) .... Take 1 Tablet By Mouth Three Times A Day As Needed Pain 13)  Allopurinol 100 Mg Tabs (Allopurinol) .... Take 1 Tablet By Mouth Once A Day  Current Medications (verified): 1)  Dilantin 100 Mg Caps (Phenytoin Sodium Extended) .... Take Four Capsules By Mouth Daily 2)  Allegra 180 Mg Tabs (Fexofenadine Hcl) .... Take 1 Tablet By Mouth Once A Day As Needed 3)  Diovan 320 Mg Tabs (Valsartan) .... Take 1 Tablet By Mouth Once A Day 4)  Flonase 50 Mcg/act Susp (Fluticasone Propionate) .... Inhale 2 Puffs Two Times A Day 5)  Kaletra 200-50 Mg Tabs (Lopinavir-Ritonavir) .... Take Two Tablets Twice Daily 6)  Combivir 150-300 Mg Tabs (Lamivudine-Zidovudine) .... Take One Tablet Twice Daily 7)  Viread 300 Mg Tabs (Tenofovir Disoproxil Fumarate) .... Take One Tablet Daily 8)  Hydrochlorothiazide 25 Mg  Tabs (Hydrochlorothiazide) .... Take 1 Tablet By Mouth Once A Day 9)  Percocet 7.5-325 Mg Tabs (Oxycodone-Acetaminophen) .... Take One Tablet Every Six Hours As Needed For Pain. 10)  Norvasc 5 Mg Tabs (Amlodipine Besylate) .... Take 1 Tablet By Mouth Once A Day 11)  Diclofenac Sodium 75 Mg Tbec (Diclofenac Sodium) .... Take 1 Tablet By Mouth Once A Day or Prn For Foot Pain,  With Food. 12)  Lortab 5 5-500 Mg Tabs  (Hydrocodone-Acetaminophen) .... Take 1 Tablet By Mouth Three Times A Day As Needed Pain 13)  Allopurinol 100 Mg Tabs (Allopurinol) .... Take 1 Tablet By Mouth Once A Day  Allergies (verified): No Known Drug Allergies  Past History:  Past Medical History: Last updated: 05/16/2009 HIV DISEASE    - dx'd in the 80's    - on tx ever since    a) complicated by PERIPHERAL NEUROPATHY HYPERTENSION SEIZURE DISORDER    - last sz more than 5 yrs ago    - started in his 26s - has family hx (father and brother) Hx of syphilis 11 years ago (1997) STAGE II CHRONIC KIDNEY DISEASE - ff Dr Moshe Cipro [facility]A Hx of ringworm Allergic rhinitis Hx of STD ( gonorrhea and trichomonas) PENILE CONDYLOMATA    - s/p circumcision & cauterization 01/22/06 Hx of epididymitis and phimosis RIB FRX 07/10  Past Surgical History: Last updated: 06/08/2008 s/p circumcision 11/2005  Family History: Last updated: 07/31/2007 Mother deceased from cancer, in 64s (ENT CA 2/2 tobacco dipping). Father deceased from lung disease, alcoholism. 2 sisters, 1 with diabetes. 2 brothers, 1 with seizures, HTN. 1 daughter, 6 sons all healthy, except 1 with asthma.  No hx of prostate or colon cancer.  Social History: Last updated: 06/08/2008 Married. works with lawn care. Sexually active with wife only. Current Smoker 1/2 pk per day x 22 years. Alcohol use-yes - stopped 12/08. No IVDU.  Risk Factors: Alcohol Use: <1 (11/01/2009) Caffeine Use: 1 (11/01/2009) Exercise: no (11/01/2009)  Risk Factors: Smoking Status: current (11/01/2009) Packs/Day: 6 cigs per day (11/01/2009) Passive Smoke Exposure: no (11/01/2009)  Family History: Reviewed history from 07/31/2007 and no changes required. Mother deceased from cancer, in 21s (ENT CA 2/2 tobacco dipping). Father deceased from lung disease, alcoholism. 2 sisters, 1 with diabetes. 2 brothers, 1 with seizures, HTN. 1 daughter, 6 sons all healthy, except 1 with  asthma.  No hx of prostate or colon cancer.  Social History: Reviewed history from 06/08/2008 and no changes required. Married. works with lawn care. Sexually active with wife only. Current Smoker 1/2 pk per day x 22 years. Alcohol use-yes - stopped 12/08. No IVDU. Packs/Day:  6 cigs per day  Review of Systems       per HPI  Physical Exam  General:  Well-developed,well-nourished,in no acute distress; alert,appropriate and cooperative throughout examination Mouth:  no dental plaque, pharynx pink and moist, no erythema, no exudates, no posterior lymphoid hypertrophy, no postnasal drip, no pharyngeal crowing, no lesions, no aphthous ulcers, no erosions, no tongue abnormalities, no leukoplakia, no petechiae, and fair dentition.   Neck:  No deformities, masses, or tenderness noted. Lungs:  Normal respiratory effort, chest expands symmetrically. Lungs are clear to auscultation, no crackles or wheezes. Heart:  Normal rate and regular rhythm. S1 and S2 normal without gallop, murmur, click, rub or other extra sounds. Abdomen:  Bowel sounds positive,abdomen soft and non-tender without masses, organomegaly or hernias noted. Extremities:  No clubbing, cyanosis, edema, or deformity noted with normal full range of motion of all joints.   Neurologic:  No cranial nerve deficits noted. Station and gait are normal. Plantar reflexes are down-going bilaterally. DTRs are symmetrical throughout. Sensory, motor and coordinative functions appear intact. Skin:  Intact without suspicious lesions or rashes Cervical Nodes:  No lymphadenopathy noted Psych:  Cognition and judgment appear intact. Alert and cooperative with normal attention span and concentration. No apparent delusions, illusions, hallucinations   Impression & Recommendations:  Problem # 1:  KIDNEY DISEASE, CHRONIC, STAGE II (ICD-585.2)  Baseline creatinine is 1.5-1.84 so this indicates that he is at his baseline and this might as well be  secondary to HTn but it could be side effect of his ARV therapy. Viread and Combivir are known to affect kidneys in such a way. I think it would be safe to continue the meds however but have regular bmets done to follow up on Cr levels. Will discuss with Dr. Johnnye Sima. I would like to check UA today and urine micro/creatinine just to make sure he is not spilling protein in urine.  Labs Reviewed: BUN: 32 (10/23/2009)   Cr: 1.68 (10/23/2009)    Hgb: 15.1 (10/23/2009)   Hct: 44.0 (10/23/2009)   Ca++: 9.1 (10/23/2009)    TP: 6.7 (10/23/2009)   Alb: 4.3 (10/23/2009) HBSAg: NO (09/14/2006)   HBSAb: NO (09/14/2006)  Orders: T-Urinalysis [id number]) T-Urine Microalbumin w/creat.  ratio [id number])  Problem # 2:  SEIZURE DISORDER (ICD-780.39)  Patient has been on Dilantin for many years and this could also explain some of the abnormalities on his labs including elevated alk phos so I would like to check dilantin levels today and readjust teh regimen as needed.  His updated medication list for this problem includes:    Dilantin 100 Mg Caps (Phenytoin sodium extended) .Marland Kitchen... Take four capsules by mouth daily  Orders: T-Dilantin (Phenytoin) [id number])  Problem # 3:  TOBACCO USE (ICD-305.1)  Encouraged smoking cessation and discussed different methods for smoking cessation.   Problem # 4:  HYPERTENSION (ICD-401.9) At goal, will continue the same regimen.  His updated medication list for this problem includes:    Diovan 320 Mg Tabs (Valsartan) .Marland Kitchen... Take 1 tablet by mouth once a day    Hydrochlorothiazide 25 Mg Tabs (Hydrochlorothiazide) .Marland Kitchen... Take 1 tablet by mouth once a day    Norvasc 5 Mg Tabs (Amlodipine besylate) .Marland Kitchen... Take 1 tablet by mouth once a day  BP today: 130/84 Prior BP: 157/98 (07/25/2009)  Prior 10 Yr Risk Heart Disease: 7 % (07/21/2007)  Labs Reviewed: K+: 4.3 (10/23/2009) Creat: : 1.68 (10/23/2009)   Chol: 127 (10/23/2009)   HDL: 32 (10/23/2009)   LDL: 16  (10/23/2009)   TG: 394 (10/23/2009)  Problem # 5:  HIV DISEASE (ICD-042) Patient is on Kaletra, Combivir, and Viread and again his lab abnormalities could be explained by the regimen but I think it would be safe to continue the same treatment and to have regular lab follow ups. Will discuss with Dr. Johnnye Sima.   Problem # 6:  ALKALINE PHOSPHATASE, ELEVATED (ICD-790.5)  Similar in value to the one 06/2009 and again I think this could be explained by Viread or dilantin's side effect. Since patient is asymptomatic it would be reasonable to have regular follow up and to continue the same regimen. Will check GGt today and will follow up on results.  Orders: T- * Misc. Laboratory test [phone])  Problem # 7:  HYPERTRIGLYCERIDEMIA (ICD-272.1) Kaletra is known to cause elevated TG's, I would not treat this with statin, maybe Gemfibrozil but I am not sure that would help given the fact it is med side effect. Recommend follow up on TG's.  Labs Reviewed: SGOT: 28 (10/23/2009)   SGPT: 31 (10/23/2009)  Prior 10 Yr Risk Heart Disease: 7 % (07/21/2007)   HDL:32 (10/23/2009), 41 (08/23/2008)  LDL:16 (10/23/2009), 50 (08/23/2008)  Chol:127 (10/23/2009), 162 (08/23/2008)  Trig:394 (10/23/2009), 355 (08/23/2008)  Problem # 8:  GOUT, UNSPECIFIED (ICD-274.9) Would continue the regimen, pt is asymptomatic. Kaletra has known side effect of hyperuricemia.  His updated medication list for this problem includes:    Allopurinol 100 Mg Tabs (Allopurinol) .Marland Kitchen... Take 1 tablet by mouth once a day  Problem # 9:  MACROCYTIC ANEMIA (ICD-281.9) Could be side effect of Combivir or his alcohol use. Will continue to monitor.  Complete Medication List: 1)  Dilantin 100 Mg Caps (Phenytoin sodium extended) .... Take four capsules by mouth daily 2)  Allegra 180 Mg Tabs (Fexofenadine hcl) .... Take 1 tablet by mouth once a day as needed 3)  Diovan 320 Mg Tabs (Valsartan) .... Take 1 tablet by mouth once a day 4)  Flonase 50  Mcg/act Susp (Fluticasone propionate) .... Inhale 2 puffs two times a day 5)  Kaletra 200-50 Mg Tabs (Lopinavir-ritonavir) .... Take two tablets twice daily 6)  Combivir 150-300 Mg Tabs (Lamivudine-zidovudine) .... Take one tablet twice daily 7)  Viread 300 Mg Tabs (Tenofovir disoproxil fumarate) .... Take one tablet daily 8)  Hydrochlorothiazide 25 Mg Tabs (Hydrochlorothiazide) .... Take 1 tablet by mouth once a day 9)  Percocet 7.5-325 Mg Tabs (Oxycodone-acetaminophen) .... Take one tablet every six hours as needed for pain. 10)  Norvasc 5 Mg Tabs (Amlodipine besylate) .... Take 1 tablet by mouth once a day 11)  Diclofenac Sodium 75 Mg Tbec (Diclofenac sodium) .... Take 1 tablet by mouth once a day or prn for foot pain,  with food. 12)  Lortab 5 5-500 Mg Tabs (Hydrocodone-acetaminophen) .... Take 1 tablet by mouth three times a day as needed pain 13)  Allopurinol 100 Mg Tabs (Allopurinol) .... Take 1 tablet by mouth once a day  Patient Instructions: 1)  Please schedule a follow-up appointment in 1 month. 2)  Check your Blood Pressure regularly. If it is above 170: you should make an appointment. Process Orders Check Orders Results:     [facility]: Order checked:     [id number] -- T- * Misc. Laboratory test -- No CPT codes found (CPT: ) Order queued for requisitioning for [facility]: November 01, 2009 9:20 AM  Tests Sent for requisitioning (November 01, 2009 9:20 AM):     11/01/2009: [facility] -- T-Urinalysis [id number] (signed)     11/01/2009: [facility] -- T-Urine Microalbumin w/creat. ratio [[id number]] (signed)     11/01/2009: [facility] -- T-Dilantin (Phenytoin) [id number] (signed)     11/01/2009: [facility] Laboratory test [id number] (signed)    Prevention & Chronic Care Immunizations   Influenza vaccine: Fluvax 3+  (05/04/2009)   Influenza vaccine deferral: Deferred  (03/14/2009)    Influenza vaccine due: 03/21/2010    Tetanus booster: Not documented   Td booster deferral: Not indicated  (11/01/2009)    Pneumococcal vaccine: Pneumovax  (10/28/2006)   Pneumococcal vaccine deferral: Deferred  (03/14/2009)  Other Screening   Smoking status: current  (11/01/2009)   Smoking cessation counseling: yes  (11/01/2009)  Lipids   Total Cholesterol: 127  (10/23/2009)   LDL: 16  (10/23/2009)   LDL Direct: Not documented   HDL: 32  (10/23/2009)   Triglycerides: 394  (10/23/2009)    SGOT (AST): 28  (10/23/2009)   SGPT (ALT): 31  (10/23/2009)   Alkaline phosphatase: 191  (10/23/2009)   Total bilirubin: 0.6  (10/23/2009)    Lipid flowsheet reviewed?: Yes   Progress toward LDL goal: At goal  Hypertension   Last Blood Pressure: 130 / 84  (11/01/2009)   Serum creatinine: 1.68  (10/23/2009)   Serum potassium 4.3  (10/23/2009)    Hypertension flowsheet reviewed?: Yes   Progress toward BP goal: At goal  Self-Management Support :   Personal Goals (by the next clinic visit) :      Personal blood pressure goal: 140/90  (03/14/2009)   Patient will work on the following items until the next clinic visit to reach self-care goals:     Medications and monitoring: take my medicines every day, bring all of my medications to every visit  (11/01/2009)     Eating: eat more vegetables, use fresh or frozen vegetables, eat fruit for snacks and desserts  (11/01/2009)     Activity: take a 30 minute walk every day  (11/01/2009)     Other: Drink water and try to avoid Sodas as much as possible.  (03/14/2009)    Hypertension self-management support: Written self-care plan, Education handout, Resources for patients handout  (11/01/2009)  Hypertension self-care plan printed.   Hypertension education handout printed    Lipid self-management support: Resources for patients handout  (11/01/2009)        Resource handout printed.

## 2010-08-20 NOTE — Progress Notes (Signed)
Summary: Refill/gh  Phone Note Refill Request Message from:  Fax from Pharmacy on Nov 22, 2009 3:31 PM  Refills Requested: Medication #1:  HYDROCHLOROTHIAZIDE 25 MG  TABS Take 1 tablet by mouth once a day   Last Refilled: 10/18/2009  Medication #2:  COMBIVIR 150-300 MG TABS take one tablet twice daily   Last Refilled: 10/18/2009  Medication #3:  KALETRA 200-50 MG TABS take two tablets twice daily   Last Refilled: 10/18/2009  Medication #4:  DILANTIN 100 MG CAPS take four capsules by mouth daily   Last Refilled: 10/18/2009  Method Requested: Electronic Initial call taken by: Sander Nephew RN,  Nov 22, 2009 3:33 PM    Prescriptions: DILANTIN 100 MG CAPS (PHENYTOIN SODIUM EXTENDED) take four capsules by mouth daily  #120 x 3   Entered and Authorized by:   Junius Finner  MD   Signed by:   Junius Finner  MD on 11/26/2009   Method used:   Faxed to ...       [facility] (retail)       [address]       [place], [place]  [address]       Ph: [phone]       Fax: [phone]   [id number] HYDROCHLOROTHIAZIDE 25 MG  TABS (HYDROCHLOROTHIAZIDE) Take 1 tablet by mouth once a day  #30 x 6   Entered and Authorized by:   Junius Finner  MD   Signed by:   Junius Finner  MD on 11/26/2009   Method used:   Faxed to ...       [facility] (retail)       [address]       [place], [place]  [address]       Ph: [phone]       Fax: [phone]   [id number] COMBIVIR 150-300 MG TABS (LAMIVUDINE-ZIDOVUDINE) take one tablet twice daily  #62 x 6   Entered and Authorized by:   Junius Finner  MD   Signed by:   Junius Finner  MD on 11/26/2009   Method used:   Faxed to ...       [facility] (retail)       [address]       [place], [place]  [address]       Ph: [phone]       Fax: [phone]   RxID:   [id number] Vevelyn Francois 200-50 MG TABS (LOPINAVIR-RITONAVIR)  take two tablets twice daily  #124 x 6   Entered and Authorized by:   Junius Finner  MD   Signed by:   Junius Finner  MD on 11/26/2009   Method used:   Faxed to ...       [facility] (retail)       [address]       [place], [place]  [address]       Ph: [phone]       Fax: [phone]   RxID:   [id number]

## 2010-08-20 NOTE — Progress Notes (Signed)
Summary: Refill/gh  Phone Note Refill Request Message from:  Pharmacy on Nov 22, 2009 3:34 PM  Refills Requested: Medication #1:  DIOVAN 320 MG TABS Take 1 tablet by mouth once a day   Last Refilled: 10/18/2009  Method Requested: Electronic Initial call taken by: Sander Nephew RN,  Nov 22, 2009 3:34 PM    Prescriptions: DIOVAN 320 MG TABS (VALSARTAN) Take 1 tablet by mouth once a day  #30 x 6   Entered and Authorized by:   Junius Finner  MD   Signed by:   Junius Finner  MD on 11/23/2009   Method used:   Faxed to ...       [facility] (retail)       [address]       [place], [place]  [address]       Ph: [phone]       Fax: [phone]   RxID:   [id number]

## 2010-08-20 NOTE — Assessment & Plan Note (Signed)
Summary: 3 MONTH F/U VS   Primary Provider:  Junius Finner  MD  CC:  3 month follow up.  History of Present Illness: 45 yo male with HIV, HTN, seizure disorder CD4 610 and VL <48. Had Cr 1.68 at recent f/u (10-23-09). 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 has prev recieved steroid injections into joints.    Preventive Screening-Counseling & Management  Alcohol-Tobacco     Alcohol drinks/day: <1     Alcohol type: liquor-occassionally on weekends     Smoking Status: current     Smoking Cessation Counseling: yes     Packs/Day: 6 cigs per day     Year Started: 1980's     Passive Smoke Exposure: no  Caffeine-Diet-Exercise     Caffeine use/day: tea and sodas     Does Patient Exercise: no     Type of exercise: walking     Times/week: <3  Safety-Violence-Falls     Seat Belt Use: yes   Updated Prior Medication List: DILANTIN 100 MG CAPS (PHENYTOIN SODIUM EXTENDED) take four capsules by mouth daily DIOVAN 320 MG TABS (VALSARTAN) Take 1 tablet by mouth once a day KALETRA 200-50 MG TABS (LOPINAVIR-RITONAVIR) take two tablets twice daily COMBIVIR 150-300 MG TABS (LAMIVUDINE-ZIDOVUDINE) take one tablet twice daily VIREAD 300 MG TABS (TENOFOVIR DISOPROXIL FUMARATE) take one tablet daily HYDROCHLOROTHIAZIDE 25 MG  TABS (HYDROCHLOROTHIAZIDE) Take 1 tablet by mouth once a day ALLOPURINOL 100 MG TABS (ALLOPURINOL) Take 1 tablet by mouth once a day  Current Allergies (reviewed today): No known allergies  Current Medications (verified): 1)  Dilantin 100 Mg Caps (Phenytoin Sodium Extended) .... Take Four Capsules By Mouth Daily 2)  Diovan 320 Mg Tabs (Valsartan) .... Take 1 Tablet By Mouth Once A Day 3)  Kaletra 200-50 Mg Tabs (Lopinavir-Ritonavir) .... Take Two Tablets Twice Daily 4)  Combivir 150-300 Mg Tabs (Lamivudine-Zidovudine) .... Take One Tablet Twice Daily 5)  Viread 300 Mg Tabs (Tenofovir Disoproxil Fumarate) .... Take One Tablet Daily 6)  Hydrochlorothiazide 25  Mg  Tabs (Hydrochlorothiazide) .... Take 1 Tablet By Mouth Once A Day 7)  Allopurinol 100 Mg Tabs (Allopurinol) .... Take 1 Tablet By Mouth Once A Day  Allergies (verified): No Known Drug Allergies   Family History: Mother deceased from cancer, in 49s (ENT CA 2/2 tobacco dipping). Father deceased from lung disease, alcoholism. 2 sisters, 1 with diabetes. 2 brothers, 1 with seizures, HTN. 1 daughter, 7 sons all healthy, except 1 with asthma. 11 grandkids.    No hx of prostate or colon cancer.  Review of Systems       wt down 6#,   Vital Signs:  Patient profile:   45 year old male Height:      71 inches (180.34 cm) Weight:      222.0 pounds (100.91 kg) BMI:     31.07 Temp:     98.8 degrees F (37.11 degrees C) oral Pulse rate:   91 / minute BP sitting:   151 / 91  (left arm)  Vitals Entered By: Rocky Morel) (November 07, 2009 10:05 AM) CC: 3 month follow up Pain Assessment Patient in pain? yes     Location: rt. knee Intensity: 4 Type: aching Onset of pain  pain comes and goes Nutritional Status BMI of > 30 = obese Nutritional Status Detail appetite is good per patient  Have you ever been in a relationship where you felt threatened, hurt or afraid?No   Does patient need assistance? Functional Status  Self care Ambulation Normal        Medication Adherence: 11/07/2009   Adherence to medications reviewed with patient. Counseling to provide adequate adherence provided   Prevention For Positives: 11/07/2009   Safe sex practices discussed with patient. Condoms offered.                             Physical Exam  General:  well-developed, well-nourished, and well-hydrated.   Eyes:  pupils equal, pupils round, and pupils reactive to light.   Mouth:  pharynx pink and moist, no exudates, and poor dentition.   Neck:  no masses.   Lungs:  normal respiratory effort and normal breath sounds.   Heart:  normal rate, regular rhythm, and no murmur.   Abdomen:   soft, non-tender, and normal bowel sounds.     Impression & Recommendations:  Problem # 1:  HIV DISEASE (ICD-042) will f/u with [place] with his daughters help. offered condoms. taking ART well but will change his TFV to ISN to try and halt worsening renal dysfunction. return to clinic 4 months  Problem # 2:  TOBACCO USE (ICD-305.1) encouraged to quit  Problem # 3:  HYPERTENSION (ICD-401.9) will change his TFV to ISN to try and hasten his renal dysfunciton. not clear if HIVAN or HTN or both. appreciate IM f/u The following medications were removed from the medication list:    Norvasc 5 Mg Tabs (Amlodipine besylate) .Marland Kitchen... Take 1 tablet by mouth once a day His updated medication list for this problem includes:    Diovan 320 Mg Tabs (Valsartan) .Marland Kitchen... Take 1 tablet by mouth once a day    Hydrochlorothiazide 25 Mg Tabs (Hydrochlorothiazide) .Marland Kitchen... Take 1 tablet by mouth once a day  Medications Added to Medication List This Visit: 1)  Isentress 400 Mg Tabs (Raltegravir potassium) .... Take 1 tablet by mouth two times a day  Other Orders: Est. Patient Level IV [id number]) Future Orders: T-CD4SP ([facility]) (CD4SP) ... 02/05/2010 T-HIV Viral Load [phone]) ... 02/05/2010 T-Comprehensive Metabolic Panel ([id number]) ... 02/05/2010 T-CBC w/Diff [id number]) ... 02/05/2010  Prescriptions: ISENTRESS 400 MG TABS (RALTEGRAVIR POTASSIUM) Take 1 tablet by mouth two times a day  #60 x 3   Entered and Authorized by:   Bobby Rumpf MD   Signed by:   Bobby Rumpf MD on 11/07/2009   Method used:   Faxed to ...       [facility] (retail)       [address]       [place], [place]  [address]       Ph: [phone]       Fax: [phone]   RxID:   [id number]  Process Orders Check Orders Results:     [facility]: Check successful Tests Sent for requisitioning (November 07, 2009 10:33 AM):     02/05/2010: [facility] --  T-HIV Viral Load [id number] (signed)     02/05/2010: [facility] -- T-Comprehensive Metabolic Panel [id number] (signed)     02/05/2010: [facility] -- [facility] w/Diff [id number] (signed)

## 2010-08-20 NOTE — Assessment & Plan Note (Signed)
Summary: resfrom 10:45   Primary Provider:  Junius Finner  MD  CC:  follow-up visit, took a vicodin before he came today, and right rib cage still hurting.  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 cont to have some rib pain from previous falll. CD4 400 and VL 118 (07-12-09). CD  Preventive Screening-Counseling & Management  Alcohol-Tobacco     Alcohol drinks/day: <1     Alcohol type: liquor-occassionally on weekends     Smoking Status: current     Smoking Cessation Counseling: yes     Packs/Day: 0.5     Year Started: 1980's     Passive Smoke Exposure: no  Caffeine-Diet-Exercise     Caffeine use/day: 1     Does Patient Exercise: no     Type of exercise: walking     Times/week: <3  Hep-HIV-STD-Contraception     HIV Risk: risk noted     HIV Risk Counseling: to avoid increased HIV risk  Safety-Violence-Falls     Seat Belt Use: 100  Comments: condoms declined      Sexual History:  currently monogamous, married, and but not having sex.        Drug Use:  former.     Updated Prior Medication List: DILANTIN 100 MG CAPS (PHENYTOIN SODIUM EXTENDED) take four capsules by mouth daily ALLEGRA 180 MG TABS (FEXOFENADINE HCL) Take 1 tablet by mouth once a day as needed DIOVAN 320 MG TABS (VALSARTAN) Take 1 tablet by mouth once a day FLONASE 50 MCG/ACT SUSP (FLUTICASONE PROPIONATE) Inhale 2 puffs two times a day KALETRA 200-50 MG TABS (LOPINAVIR-RITONAVIR) take two tablets twice daily COMBIVIR 150-300 MG TABS (LAMIVUDINE-ZIDOVUDINE) take one tablet twice daily VIREAD 300 MG TABS (TENOFOVIR DISOPROXIL FUMARATE) take one tablet daily HYDROCHLOROTHIAZIDE 25 MG  TABS (HYDROCHLOROTHIAZIDE) Take 1 tablet by mouth once a day PERCOCET 7.5-325 MG TABS (OXYCODONE-ACETAMINOPHEN) Take one tablet every six hours as needed for pain. NORVASC 5 MG  TABS (AMLODIPINE BESYLATE) Take 1 tablet by mouth once a day DICLOFENAC SODIUM 75 MG TBEC (DICLOFENAC SODIUM) Take 1 tablet by mouth once a day or prn for foot pain,  with food.  Current Allergies (reviewed today): No known allergies  Past History:  Past medical, surgical, family and social histories (including risk factors) reviewed, and no changes noted (except as noted below).  Past Medical History: Reviewed history from 05/16/2009 and no changes required. HIV DISEASE    - dx'd in the 80's    - on tx ever since    a) complicated by [place]    - last sz more than 5 yrs ago    - started in his 88s - has family hx (father and brother) Hx of syphilis 11 years ago (1997) STAGE II CHRONIC KIDNEY DISEASE - ff Dr Moshe Cipro [facility] Hx of ringworm Allergic rhinitis Hx of STD ( gonorrhea and trichomonas) PENILE CONDYLOMATA    - s/p circumcision & cauterization 01/22/06 Hx of epididymitis and phimosis RIB FRX 07/10  Past Surgical History: Reviewed history from 06/08/2008 and no changes required. s/p circumcision 11/2005  Current Medications (verified): 1)  Dilantin 100 Mg Caps (Phenytoin Sodium Extended) .... Take Four Capsules By Mouth Daily 2)  Allegra 180 Mg Tabs (Fexofenadine Hcl) .... Take 1 Tablet By Mouth Once A Day As Needed 3)  Diovan  320 Mg Tabs (Valsartan) .... Take 1 Tablet By Mouth Once A Day 4)  Flonase 50 Mcg/act Susp (Fluticasone Propionate) .... Inhale 2 Puffs Two Times A Day 5)  Kaletra 200-50 Mg Tabs (Lopinavir-Ritonavir) .... Take Two Tablets Twice Daily 6)  Combivir 150-300 Mg Tabs (Lamivudine-Zidovudine) .... Take One Tablet Twice Daily 7)  Viread 300 Mg Tabs (Tenofovir Disoproxil Fumarate) .... Take One Tablet Daily 8)  Hydrochlorothiazide 25 Mg  Tabs (Hydrochlorothiazide) .... Take 1 Tablet By Mouth Once A Day 9)  Percocet 7.5-325 Mg Tabs (Oxycodone-Acetaminophen) .... Take One Tablet Every Six Hours As Needed For  Pain. 10)  Norvasc 5 Mg Tabs (Amlodipine Besylate) .... Take 1 Tablet By Mouth Once A Day 11)  Diclofenac Sodium 75 Mg Tbec (Diclofenac Sodium) .... Take 1 Tablet By Mouth Once A Day or Prn For Foot Pain,  With Food.  Allergies: No Known Drug Allergies   Family History: Reviewed history from 07/21/2007 and no changes required. Mother deceased from cancer, in 21s (ENT CA 2/2 tobacco dipping). Father deceased from lung disease, alcoholism. 2 sisters, 1 with diabetes. 2 brothers, 1 with seizures, HTN. 1 daughter, 6 sons all healthy, except 1 with asthma.  No hx of prostate or colon cancer.  Social History: Reviewed history from 06/08/2008 and no changes required. Married. works with lawn care. Sexually active with wife only. Current Smoker 1/2 pk per day x 22 years. Alcohol use-yes - stopped 12/08. No IVDU. Sexual History:  currently monogamous, married, but not having sex Drug Use:  former  Vital Signs:  Patient profile:   45 year old male Height:      71 inches (180.34 cm) Weight:      229.7 pounds (104.41 kg) BMI:     32.15 Temp:     97.7 degrees F (36.50 degrees C) oral Pulse rate:   99 / minute BP sitting:   157 / 98  (left arm)  Vitals Entered By: Lorne Skeens RN (July 25, 2009 9:49 AM) CC: follow-up visit, took a vicodin before he came today, right rib cage still hurting Is Patient Diabetic? No Pain Assessment Patient in pain? yes     Location: right side of rib cage Intensity: 7 Type: sharp Onset of pain  chronic, fractured ribs Nutritional Status BMI of > 30 = obese Nutritional Status Detail appetite "fine"  Have you ever been in a relationship where you felt threatened, hurt or afraid?No   Does patient need assistance? Functional Status Self care Ambulation Normal Comments may have missed 2-3 doses of his HIV rxes        Medication Adherence: 07/25/2009   Adherence to medications reviewed with patient. Counseling to provide adequate adherence  provided   Prevention For Positives: 07/25/2009   Safe sex practices discussed with patient. Condoms offered.                             Physical Exam  General:  well-developed, well-nourished, well-hydrated, and overweight-appearing.   Eyes:  pupils equal, pupils round, and pupils reactive to light.   Mouth:  pharynx pink and moist, no exudates, and fair dentition.   Neck:  no masses.   Lungs:  normal respiratory effort and normal breath sounds.   Heart:  normal rate, regular rhythm, and no murmur.   Abdomen:  soft, non-tender, and normal bowel sounds.     Impression & Recommendations:  Problem # 1:  HIV DISEASE (ICD-042) he is doing  very well. gets next hep B, final hep A. offered condoms. return to clinic 4 months with labs prior. offered condoms but he states he has not had sex in years.   Problem # 2:  SEIZURE DISORDER (ICD-780.39) states he has not had a seizure in years. was rec to stop dilantin, but he was too scared to stop it.  His updated medication list for this problem includes:    Dilantin 100 Mg Caps (Phenytoin sodium extended) .Marland Kitchen... Take four capsules by mouth daily  Problem # 3:  FRACTURE, RIBS, MULTIPLE (ICD-807.09) would like refill of his pain med. will give him rx for lortab.  Problem # 4:  HYPERTENSION (ICD-401.9) will have him f/u with Dr Redmond Pulling in [place]. his BP is moderately elavated today.  His updated medication list for this problem includes:    Diovan 320 Mg Tabs (Valsartan) .Marland Kitchen... Take 1 tablet by mouth once a day    Hydrochlorothiazide 25 Mg Tabs (Hydrochlorothiazide) .Marland Kitchen... Take 1 tablet by mouth once a day    Norvasc 5 Mg Tabs (Amlodipine besylate) .Marland Kitchen... Take 1 tablet by mouth once a day  Medications Added to Medication List This Visit: 1)  Diclofenac Sodium 75 Mg Tbec (Diclofenac sodium) .... Take 1 tablet by mouth once a day or prn for foot pain,  with food.  Other Orders: Hepatitis B Vaccine >53yrs [phone]) Admin 1st Vaccine  [phone]) Admin 1st Vaccine (State) [phone]) Est. Patient Level IV [id number]) Future Orders: T-CD4SP ([facility]) (CD4SP) ... 10/23/2009 T-HIV Viral Load [phone]) ... 10/23/2009 T-Comprehensive Metabolic Panel ([id number]) ... 10/23/2009 T-CBC w/Diff [id number]) ... 10/23/2009 T-RPR (Syphilis) [phone]) ... 10/23/2009 T-Lipid Profile [id number]) ... 10/23/2009  Process Orders Check Orders Results:     [facility]: Order checked:     [id number] -- T-Lipid Profile -- ABN required due to diagnosis (CPT: [id number]) Tests Sent for requisitioning (July 25, 2009 10:25 AM):     10/23/2009: [facility] -- T-HIV Viral Load [phone] (signed)     10/23/2009: [facility] -- T-Comprehensive Metabolic Panel [id number] (signed)     10/23/2009: [facility] -- T-CBC w/Diff [id number] (signed)     10/23/2009: [facility] -- T-RPR (Syphilis) [phone] (signed)     10/23/2009: [facility] -- T-Lipid Profile [phone] (signed)    Hepatitis B Vaccine # 2    Vaccine Type: HepB Adult    Site: right deltoid    Mfr: Merck    Dose: 0.5 ml    Route: IM    Given by: Lorne Skeens RN    Exp. Date: 06/23/2011    Lot #: [id number]  Appended Document: resfrom 10:45    Prescriptions: LORTAB 5 5-500 MG TABS (HYDROCODONE-ACETAMINOPHEN) Take 1 tablet by mouth three times a day as needed pain  #20 x 0   Entered and Authorized by:   Bobby Rumpf MD   Signed by:   Bobby Rumpf MD on 07/25/2009   Method used:   Print then Give to Patient   RxID:   [id number]     Appended Document: resfrom 10:45   Hepatitis A Vaccine # 2    Vaccine Type: HepA    Site: left deltoid    Mfr: GlaxoSmithKline    Dose: 1.0 ml    Route: IM    Given by: Lorne Skeens RN    Exp. Date: 11/07/2011    Lot #: [id number]   Appended Document: resfrom 10:45 RN called refill  for Lortab 5-500 to [facility]  Drug

## 2010-08-20 NOTE — Assessment & Plan Note (Signed)
Summary: CHECKUP/SB.   Vital Signs:  Patient profile:   45 year old male Height:      71 inches (180.34 cm) Weight:      228.0 pounds (103.64 kg) BMI:     31.91 Temp:     98.7 degrees F (37.06 degrees C) oral Pulse rate:   84 / minute BP sitting:   132 / 84  (right arm)  Vitals Entered By: Hilda Blades Ditzler RN (January 29, 2010 4:09 PM) Is Patient Diabetic? No Pain Assessment Patient in pain? yes     Location: knees and ribs Intensity: 6-7 Type: aching Onset of pain  long time sees Dr Eddie Dibbles Nutritional Status BMI of > 30 = obese Nutritional Status Detail appetite good  Have you ever been in a relationship where you felt threatened, hurt or afraid?denies   Does patient need assistance? Functional Status Self care Ambulation Normal Comments Discuss gout med.   Primary Care Provider:  Junius Finner  MD   History of Present Illness: 45 yo male with PMH outlined below presents to [facility] for regular follow up appointment. He has no concerns at the time. No recent sicknesses or hospitalizaitons. No episodes of chest pain, SOB, palpitations, no fever or chills. No specific abdominal or urinary concerns. No recent changes in appetite, weight, sleep patterns, mood. He needs refill on his meds.    Depression History:      The patient denies a depressed mood most of the day and a diminished interest in his usual daily activities.  The patient denies significant weight loss, significant weight gain, insomnia, hypersomnia, psychomotor agitation, psychomotor retardation, fatigue (loss of energy), feelings of worthlessness (guilt), impaired concentration (indecisiveness), and recurrent thoughts of death or suicide.        The patient denies that he feels like life is not worth living, denies that he wishes that he were dead, and denies that he has thought about ending his life.         Preventive Screening-Counseling & Management  Alcohol-Tobacco     Alcohol drinks/day: <1     Alcohol type:  liquor-occassionally on weekends     Smoking Status: current     Smoking Cessation Counseling: yes     Packs/Day: 0.5     Year Started: 1980's     Passive Smoke Exposure: no  Caffeine-Diet-Exercise     Caffeine use/day: tea and sodas     Does Patient Exercise: no     Type of exercise: walking     Times/week: <3  Problems Prior to Update: 1)  Hypertriglyceridemia  (ICD-272.1) 2)  Alkaline Phosphatase, Elevated  (ICD-790.5) 3)  Gout, Unspecified  (ICD-274.9) 4)  Fracture, Ribs, Multiple  (ICD-807.09) 5)  Knee Pain, Bilateral  (ICD-719.46) 6)  Dysuria  (ICD-788.1) 7)  Need Prophylactic Vaccination&inoculation Flu  (ICD-V04.81) 8)  Onychomycosis, Toenails  (ICD-110.1) 9)  Erectile Dysfunction  (ICD-302.72) 10)  Macrocytic Anemia  (ICD-281.9) 11)  Kidney Disease, Chronic, Stage II  (ICD-585.2) 12)  Aftercare, Long-term Use, Medications Nec  (ICD-V58.69) 13)  Syphilis Nos  (ICD-097.9) 14)  Condyloma Acuminata, Penis  (ICD-078.11) 15)  Pulmonary Tuberculosis  (ICD-011.90) 16)  Seizure Disorder  (ICD-780.39) 17)  Tobacco Use  (ICD-305.1) 18)  Peripheral Neuropathy  (ICD-356.9) 19)  Hypertension  (ICD-401.9) 20)  HIV Disease  (ICD-042) 21)  Allergic Rhinitis  (ICD-477.9) 22)  Arthritis, Right Shoulder  (ICD-716.91)  Medications Prior to Update: 1)  Dilantin 100 Mg Caps (Phenytoin Sodium Extended) .... Take Four Capsules By Mouth  Daily 2)  Diovan 320 Mg Tabs (Valsartan) .... Take 1 Tablet By Mouth Once A Day 3)  Kaletra 200-50 Mg Tabs (Lopinavir-Ritonavir) .... Take Two Tablets Twice Daily 4)  Combivir 150-300 Mg Tabs (Lamivudine-Zidovudine) .... Take One Tablet Twice Daily 5)  Hydrochlorothiazide 25 Mg  Tabs (Hydrochlorothiazide) .... Take 1 Tablet By Mouth Once A Day 6)  Allopurinol 100 Mg Tabs (Allopurinol) .... Take 1 Tablet By Mouth Once A Day 7)  Isentress 400 Mg Tabs (Raltegravir Potassium) .... Take 1 Tablet By Mouth Two Times A Day 8)  Lortab 5-500 Mg Tabs  (Hydrocodone-Acetaminophen) .... Four Times A Day As Needed Pain 9)  Diclofenac Sodium 75 Mg Tbec (Diclofenac Sodium) .... Take 1 Tablet By Mouth Two Times A Day  Current Medications (verified): 1)  Dilantin 100 Mg Caps (Phenytoin Sodium Extended) .... Take Four Capsules By Mouth Daily 2)  Diovan 320 Mg Tabs (Valsartan) .... Take 1 Tablet By Mouth Once A Day 3)  Kaletra 200-50 Mg Tabs (Lopinavir-Ritonavir) .... Take Two Tablets Twice Daily 4)  Combivir 150-300 Mg Tabs (Lamivudine-Zidovudine) .... Take One Tablet Twice Daily 5)  Hydrochlorothiazide 25 Mg  Tabs (Hydrochlorothiazide) .... Take 1 Tablet By Mouth Once A Day 6)  Allopurinol 100 Mg Tabs (Allopurinol) .... Take 1 Tablet By Mouth Once A Day 7)  Isentress 400 Mg Tabs (Raltegravir Potassium) .... Take 1 Tablet By Mouth Two Times A Day 8)  Lortab 5-500 Mg Tabs (Hydrocodone-Acetaminophen) .... Four Times A Day As Needed Pain 9)  Diclofenac Sodium 75 Mg Tbec (Diclofenac Sodium) .... Take 1 Tablet By Mouth Two Times A Day  Allergies (verified): No Known Drug Allergies  Past History:  Past Medical History: Last updated: 05/16/2009 HIV DISEASE    - dx'd in the 80's    - on tx ever since    a) complicated by PERIPHERAL NEUROPATHY HYPERTENSION SEIZURE DISORDER    - last sz more than 5 yrs ago    - started in his 47s - has family hx (father and brother) Hx of syphilis 11 years ago (1997) STAGE II CHRONIC KIDNEY DISEASE - ff Dr Moshe Cipro [facility] Hx of ringworm Allergic rhinitis Hx of STD ( gonorrhea and trichomonas) PENILE CONDYLOMATA    - s/p circumcision & cauterization 01/22/06 Hx of epididymitis and phimosis RIB FRX 07/10  Past Surgical History: Last updated: 06/08/2008 s/p circumcision 11/2005  Family History: Last updated: 21-Nov-2009 Mother deceased from cancer, in 28s (ENT CA 2/2 tobacco dipping). Father deceased from lung disease, alcoholism. 2 sisters, 1 with diabetes. 2 brothers, 1 with seizures, HTN. 1 daughter, 7  sons all healthy, except 1 with asthma. 11 grandkids.    No hx of prostate or colon cancer.  Social History: Last updated: 06/08/2008 Married. works with lawn care. Sexually active with wife only. Current Smoker 1/2 pk per day x 22 years. Alcohol use-yes - stopped 12/08. No IVDU.  Risk Factors: Alcohol Use: <1 (01/29/2010) Caffeine Use: tea and sodas (01/29/2010) Exercise: no (01/29/2010)  Risk Factors: Smoking Status: current (01/29/2010) Packs/Day: 0.5 (01/29/2010) Passive Smoke Exposure: no (01/29/2010)  Family History: Reviewed history from Nov 21, 2009 and no changes required. Mother deceased from cancer, in 73s (ENT CA 2/2 tobacco dipping). Father deceased from lung disease, alcoholism. 2 sisters, 1 with diabetes. 2 brothers, 1 with seizures, HTN. 1 daughter, 7 sons all healthy, except 1 with asthma. 11 grandkids.    No hx of prostate or colon cancer.  Social History: Reviewed history from 06/08/2008 and no changes required. Married. works  with lawn care. Sexually active with wife only. Current Smoker 1/2 pk per day x 22 years. Alcohol use-yes - stopped 12/08. No IVDU. Packs/Day:  0.5  Review of Systems       per HPI  Physical Exam  General:  Well-developed,well-nourished,in no acute distress; alert,appropriate and cooperative throughout examination Lungs:  Normal respiratory effort, chest expands symmetrically. Lungs are clear to auscultation, no crackles or wheezes. Heart:  Normal rate and regular rhythm. S1 and S2 normal without gallop, murmur, click, rub or other extra sounds. Abdomen:  Bowel sounds positive,abdomen soft and non-tender without masses, organomegaly or hernias noted. Neurologic:  No cranial nerve deficits noted. Station and gait are normal. Plantar reflexes are down-going bilaterally. DTRs are symmetrical throughout. Sensory, motor and coordinative functions appear intact. Psych:  Cognition and judgment appear intact. Alert and cooperative with  normal attention span and concentration. No apparent delusions, illusions, hallucinations   Impression & Recommendations:  Problem # 1:  GOUT, UNSPECIFIED (ICD-274.9) Cont allopurinol, well controlled on current regimen.  His updated medication list for this problem includes:    Allopurinol 100 Mg Tabs (Allopurinol) .Marland Kitchen... Take 1 tablet by mouth once a day  Problem # 2:  TOBACCO USE (ICD-305.1)  Encouraged smoking cessation and discussed different methods for smoking cessation.   Problem # 3:  HYPERTENSION (ICD-401.9) At goal, will cont the same regimen.  His updated medication list for this problem includes:    Diovan 320 Mg Tabs (Valsartan) .Marland Kitchen... Take 1 tablet by mouth once a day    Hydrochlorothiazide 25 Mg Tabs (Hydrochlorothiazide) .Marland Kitchen... Take 1 tablet by mouth once a day  BP today: 132/84 Prior BP: 151/91 (11/07/2009)  Prior 10 Yr Risk Heart Disease: 7 % (07/21/2007)  Labs Reviewed: K+: 4.3 (10/23/2009) Creat: : 1.68 (10/23/2009)   Chol: 127 (10/23/2009)   HDL: 32 (10/23/2009)   LDL: 16 (10/23/2009)   TG: 394 (10/23/2009)  Problem # 4:  HIV DISEASE (ICD-042) Cont HAART, refill provided.   Complete Medication List: 1)  Dilantin 100 Mg Caps (Phenytoin sodium extended) .... Take four capsules by mouth daily 2)  Diovan 320 Mg Tabs (Valsartan) .... Take 1 tablet by mouth once a day 3)  Kaletra 200-50 Mg Tabs (Lopinavir-ritonavir) .... Take two tablets twice daily 4)  Combivir 150-300 Mg Tabs (Lamivudine-zidovudine) .... Take one tablet twice daily 5)  Hydrochlorothiazide 25 Mg Tabs (Hydrochlorothiazide) .... Take 1 tablet by mouth once a day 6)  Allopurinol 100 Mg Tabs (Allopurinol) .... Take 1 tablet by mouth once a day 7)  Isentress 400 Mg Tabs (Raltegravir potassium) .... Take 1 tablet by mouth two times a day 8)  Lortab 5-500 Mg Tabs (Hydrocodone-acetaminophen) .... Four times a day as needed pain 9)  Diclofenac Sodium 75 Mg Tbec (Diclofenac sodium) .... Take 1 tablet by  mouth two times a day  Patient Instructions: 1)  Please schedule a follow-up appointment in 3 months. 2)  Please check your blood pressure regularly, if it is >170 please call clinic at [phone] Prescriptions: DICLOFENAC SODIUM 75 MG TBEC (DICLOFENAC SODIUM) Take 1 tablet by mouth two times a day  #30 x 0   Entered and Authorized by:   Trinidad Curet MD   Signed by:   Trinidad Curet MD on 01/29/2010   Method used:   Print then Give to Patient   RxID:   [id number] LORTAB 5-500 MG TABS (HYDROCODONE-ACETAMINOPHEN) four times a day as needed pain  #60 x 5   Entered and Authorized by:  Trinidad Curet MD   Signed by:   Trinidad Curet MD on 01/29/2010   Method used:   Print then Give to Patient   RxID:   [id number] ISENTRESS 400 MG TABS (RALTEGRAVIR POTASSIUM) Take 1 tablet by mouth two times a day  #60 x 11   Entered and Authorized by:   Trinidad Curet MD   Signed by:   Trinidad Curet MD on 01/29/2010   Method used:   Print then Give to Patient   RxID:   [id number] ALLOPURINOL 100 MG TABS (ALLOPURINOL) Take 1 tablet by mouth once a day  #30 x 5   Entered and Authorized by:   Trinidad Curet MD   Signed by:   Trinidad Curet MD on 01/29/2010   Method used:   Print then Give to Patient   [id number] HYDROCHLOROTHIAZIDE 25 MG  TABS (HYDROCHLOROTHIAZIDE) Take 1 tablet by mouth once a day  #30 x 11   Entered and Authorized by:   Trinidad Curet MD   Signed by:   Trinidad Curet MD on 01/29/2010   Method used:   Print then Give to Patient   RxID:   [id number] COMBIVIR 150-300 MG TABS (LAMIVUDINE-ZIDOVUDINE) take one tablet twice daily  #62 x 11   Entered and Authorized by:   Trinidad Curet MD   Signed by:   Trinidad Curet MD on 01/29/2010   Method used:   Print then Give to Patient   RxID:   [id number] KALETRA 200-50 MG TABS (LOPINAVIR-RITONAVIR) take two tablets twice daily  #124 x 11   Entered and Authorized by:   Trinidad Curet MD   Signed by:   Trinidad Curet MD on  01/29/2010   Method used:   Print then Give to Patient   RxID:   [id number] DIOVAN 320 MG TABS (VALSARTAN) Take 1 tablet by mouth once a day  #30 x 11   Entered and Authorized by:   Trinidad Curet MD   Signed by:   Trinidad Curet MD on 01/29/2010   Method used:   Print then Give to Patient   [id number] DILANTIN 100 MG CAPS (PHENYTOIN SODIUM EXTENDED) take four capsules by mouth daily  #120 x 3   Entered and Authorized by:   Trinidad Curet MD   Signed by:   Trinidad Curet MD on 01/29/2010   Method used:   Print then Give to Patient   RxID:   [id number]    Prevention & Chronic Care Immunizations   Influenza vaccine: Fluvax 3+  (05/04/2009)   Influenza vaccine deferral: Not indicated  (01/29/2010)   Influenza vaccine due: 03/21/2010    Tetanus booster: Not documented   Td booster deferral: Not indicated  (11/01/2009)    Pneumococcal vaccine: Pneumovax  (10/28/2006)   Pneumococcal vaccine deferral: Deferred  (03/14/2009)  Other Screening   Smoking status: current  (01/29/2010)   Smoking cessation counseling: yes  (01/29/2010)  Lipids   Total Cholesterol: 127  (10/23/2009)   LDL: 16  (10/23/2009)   LDL Direct: Not documented   HDL: 32  (10/23/2009)   Triglycerides: 394  (10/23/2009)    SGOT (AST): 28  (10/23/2009)   SGPT (ALT): 31  (10/23/2009)   Alkaline phosphatase: 191  (10/23/2009)   Total bilirubin: 0.6  (10/23/2009)    Lipid flowsheet reviewed?: Yes   Progress toward LDL goal: At goal  Hypertension   Last Blood Pressure: 132 / 84  (01/29/2010)   Serum creatinine: 1.68  (10/23/2009)  Serum potassium 4.3  (10/23/2009)    Hypertension flowsheet reviewed?: Yes   Progress toward BP goal: At goal  Self-Management Support :   Personal Goals (by the next clinic visit) :      Personal blood pressure goal: 140/90  (03/14/2009)     Personal LDL goal: 100  (01/29/2010)    Patient will work on the following items until the next clinic visit to  reach self-care goals:     Medications and monitoring: take my medicines every day, bring all of my medications to every visit  (01/29/2010)     Eating: eat more vegetables, use fresh or frozen vegetables, eat fruit for snacks and desserts, limit or avoid alcohol  (01/29/2010)     Activity: take a 30 minute walk every day  (01/29/2010)     Other: Drink water and try to avoid Sodas as much as possible.  (03/14/2009)    Hypertension self-management support: Written self-care plan, Education handout, Resources for patients handout  (01/29/2010)   Hypertension self-care plan printed.   Hypertension education handout printed    Lipid self-management support: Written self-care plan, Education handout, Resources for patients handout  (01/29/2010)   Lipid self-care plan printed.   Lipid education handout printed      Resource handout printed.

## 2010-08-20 NOTE — Progress Notes (Signed)
Summary: med refill/gp  Phone Note Refill Request Message from:  Fax from Pharmacy on February 20, 2010 2:38 PM  Refills Requested: Medication #1:  DICLOFENAC SODIUM 75 MG TBEC Take 1 tablet by mouth two times a day.   Last Refilled: 01/29/2010  Method Requested: Telephone to Pharmacy Initial call taken by: Morrison Old RN,  February 20, 2010 2:38 PM  Follow-up for Phone Call        completed refill, thank you Iskra  Follow-up by: Trinidad Curet MD,  February 20, 2010 3:46 PM    Prescriptions: DICLOFENAC SODIUM 75 MG TBEC (DICLOFENAC SODIUM) Take 1 tablet by mouth two times a day  #30 x 5   Entered by:   Trinidad Curet MD   Authorized by:   Marland Kitchen [facility] ATTENDING DESKTOP   Signed by:   Trinidad Curet MD on 02/20/2010   Method used:   Faxed to ...       [facility] (retail)       [address]       [place], [place]  [address]       Ph: [phone]       Fax: [phone]   RxID:   [id number]

## 2010-08-22 NOTE — Progress Notes (Signed)
Summary: refill/ hla  Phone Note Refill Request Message from:  Fax from Pharmacy on August 02, 2010 11:36 AM  Refills Requested: Medication #1:  DIOVAN 320 MG TABS Take 1 tablet by mouth once a day   Dosage confirmed as above?Dosage Confirmed   Last Refilled: 12/13  Medication #2:  COMBIVIR 150-300 MG TABS take one tablet twice daily   Dosage confirmed as above?Dosage Confirmed   Last Refilled: 12/13  Medication #3:  HYDROCHLOROTHIAZIDE 25 MG  TABS Take 1 tablet by mouth once a day   Dosage confirmed as above?Dosage Confirmed   Last Refilled: 12/13  Medication #4:  KALETRA 200-50 MG TABS take two tablets twice daily   Dosage confirmed as above?Dosage Confirmed   Last Refilled: 12/13 Initial call taken by: Freddy Finner RN,  August 02, 2010 11:37 AM  Follow-up for Phone Call        Refill approved-nurse to complete Follow-up by: Rhea Pink  DO,  August 02, 2010 12:57 PM    Prescriptions: COMBIVIR 150-300 MG TABS (LAMIVUDINE-ZIDOVUDINE) take one tablet twice daily  #62 x 11   Entered and Authorized by:   Rhea Pink  DO   Signed by:   Rhea Pink  DO on 08/02/2010   Method used:   Electronically to        [facility] (retail)       [address]       [place], [place]  [address]       Ph: [phone]       Fax: [phone]   RxID:   [id number] HYDROCHLOROTHIAZIDE 25 MG  TABS (HYDROCHLOROTHIAZIDE) Take 1 tablet by mouth once a day  #30 x 11   Entered and Authorized by:   Rhea Pink  DO   Signed by:   Rhea Pink  DO on 08/02/2010   Method used:   Electronically to        [facility] (retail)       [address]       [place], [place]  [address]       Ph: [phone]       Fax: [phone]   RxID:   [id number] Vevelyn Francois 200-50 MG TABS (LOPINAVIR-RITONAVIR) take two tablets twice daily  #124 x 11   Entered and Authorized by:   Rhea Pink  DO   Signed by:   Rhea Pink  DO on  08/02/2010   Method used:   Electronically to        [facility] (retail)       [address]       [place], [place]  [address]       Ph: [phone]       Fax: [phone]   RxID:   [id number] DIOVAN 320 MG TABS (VALSARTAN) Take 1 tablet by mouth once a day  #30 x 11   Entered and Authorized by:   Rhea Pink  DO   Signed by:   Rhea Pink  DO on 08/02/2010   Method used:   Electronically to        [facility] (retail)       [address]       [place], [place]  [address]       Ph: [phone]  Fax: [phone]   RxIDST:[id number]

## 2010-08-22 NOTE — Miscellaneous (Signed)
Summary: RW Update  Clinical Lists Changes  Observations: Added new observation of INCOMESOURCE: unknown (08/12/2010 12:08) Added new observation of HOUSEINCOME: 0  (08/12/2010 12:08) Added new observation of HOUSING: Unknown  (08/12/2010 12:08) Added new observation of LATINO/HISP: No  (08/12/2010 12:08)

## 2010-09-05 NOTE — Progress Notes (Signed)
Summary: refill/ hla  Phone Note Refill Request Message from:  Fax from Pharmacy on August 08, 2010 5:36 PM  Refills Requested: Medication #1:  LORTAB 5-500 MG TABS four times a day as needed pain   Dosage confirmed as above?Dosage Confirmed   Last Refilled: 12/13 Initial call taken by: Freddy Finner RN,  August 08, 2010 5:37 PM  Follow-up for Phone Call        Needs evaluation and a UDS. will give one month.  Follow-up by: Rhea Pink  DO,  August 20, 2010 12:25 PM    Prescriptions: LORTAB 5-500 MG TABS (HYDROCODONE-ACETAMINOPHEN) four times a day as needed pain  #60 x 0   Entered and Authorized by:   Rhea Pink  DO   Signed by:   Rhea Pink  DO on 08/20/2010   Method used:   Telephoned to ...       [facility] (retail)       [address]       [place], [place]  [address]       Ph: [phone]       Fax: [phone]   RxID:   [id number]

## 2010-09-09 ENCOUNTER — Encounter: Payer: Self-pay | Admitting: Internal Medicine

## 2010-09-09 ENCOUNTER — Ambulatory Visit (INDEPENDENT_AMBULATORY_CARE_PROVIDER_SITE_OTHER): Payer: Medicaid Other | Admitting: Internal Medicine

## 2010-09-09 VITALS — BP 167/97 | HR 105 | Temp 97.1°F | Ht 71.0 in | Wt 227.2 lb

## 2010-09-09 DIAGNOSIS — R748 Abnormal levels of other serum enzymes: Secondary | ICD-10-CM

## 2010-09-09 DIAGNOSIS — I10 Essential (primary) hypertension: Secondary | ICD-10-CM

## 2010-09-09 DIAGNOSIS — E781 Pure hyperglyceridemia: Secondary | ICD-10-CM

## 2010-09-09 DIAGNOSIS — D539 Nutritional anemia, unspecified: Secondary | ICD-10-CM

## 2010-09-09 DIAGNOSIS — N182 Chronic kidney disease, stage 2 (mild): Secondary | ICD-10-CM

## 2010-09-09 DIAGNOSIS — B20 Human immunodeficiency virus [HIV] disease: Secondary | ICD-10-CM

## 2010-09-09 DIAGNOSIS — R569 Unspecified convulsions: Secondary | ICD-10-CM

## 2010-09-09 DIAGNOSIS — M109 Gout, unspecified: Secondary | ICD-10-CM

## 2010-09-09 DIAGNOSIS — Z23 Encounter for immunization: Secondary | ICD-10-CM

## 2010-09-09 LAB — LIPID PANEL
Cholesterol: 197 mg/dL (ref 0–200)
HDL: 40 mg/dL (ref >39)
Total CHOL/HDL Ratio: 4.9 Ratio
VLDL: 69 mg/dL — ABNORMAL HIGH (ref 0–40)

## 2010-09-09 LAB — COMPREHENSIVE METABOLIC PANEL
ALT: 50 U/L (ref 0–53)
Alkaline Phosphatase: 136 U/L — ABNORMAL HIGH (ref 39–117)
CO2: 23 mEq/L (ref 19–32)
Creat: 1.48 mg/dL (ref 0.40–1.50)
Sodium: 136 mEq/L (ref 135–145)
Total Bilirubin: 0.3 mg/dL (ref 0.3–1.2)
Total Protein: 6.7 g/dL (ref 6.0–8.3)

## 2010-09-09 LAB — CBC
MCH: 34.5 pg — ABNORMAL HIGH (ref 26.0–34.0)
MCV: 99.2 fL (ref 78.0–100.0)
Platelets: 207 10*3/uL (ref 150–400)
RDW: 13.6 % (ref 11.5–15.5)
WBC: 9.7 10*3/uL (ref 4.0–10.5)

## 2010-09-09 LAB — URIC ACID: Uric Acid, Serum: 8.7 mg/dL — ABNORMAL HIGH (ref 4.0–7.8)

## 2010-09-09 MED ORDER — HYDROCODONE-ACETAMINOPHEN 5-500 MG PO TABS: 1.0000 | ORAL_TABLET | Freq: Three times a day (TID) | ORAL | Status: DC | PRN

## 2010-09-09 NOTE — Patient Instructions (Signed)
Please schedule follow up appointment in 3 months or sooner if her symptoms do not improve or get worse. We will call you if any lab test results comes back abnormal and you do not hear from [place] you can assume that the test results are normal.

## 2010-09-09 NOTE — Assessment & Plan Note (Signed)
Patient has chronic kidney disease stage II with baseline creatinine 1.5-1.8. Last creatinine checked October 23, 2009 and was 1.68. Will reassess today to insure it is within patient's baseline.

## 2010-09-09 NOTE — Assessment & Plan Note (Signed)
Increase in alkaline phosphatase was determined to be secondary to HIV medications the patient is on. We will check liver function panel today to ensure that numbers remain within patien't baseline.

## 2010-09-09 NOTE — Assessment & Plan Note (Addendum)
Patient's symptoms off right arm pain lasting one week, certainly worrisome for gout flare especially given the fact that patient has not been taking allopurinol.  It is difficult to examine strength and range of motion due to pain plan certainly also for possibility of clot.  Today we'll check electrolyte panel, uric acid level, and we'll send patient to have a Doppler scan done of the right upper extremity. I will also start patient on prednisone taper for 10 days starting with 50 mg tablet once daily for 2 days, continuing to 40 mg tablet once daily and continuing to decrease the dose by 10 mg until taper completed

## 2010-09-09 NOTE — Assessment & Plan Note (Signed)
Well-controlled on current medication regimen we'll continue same medications.

## 2010-09-09 NOTE — Assessment & Plan Note (Addendum)
This was also determined to be secondary to combination of ART therapy and alcohol use, we will check CBC panel today.

## 2010-09-09 NOTE — Assessment & Plan Note (Signed)
Patient is following with Dr. Bobby Rumpf and infectious disease clinic. His last CD4 count was checked October 23, 2009 and the number was 610 with viral load of 48. Will recheck a CD4 count and viral load today and while forward the lab test results to patient's infectious disease doctor.

## 2010-09-09 NOTE — Progress Notes (Signed)
  Subjective:    Patient ID: Darrell Johnson, male    DOB: 02-Nov-1965, 45 y.o.   MRN: [id number]  HPI Patient is 45 year old male past medical history outlined below presents to clinic with concern of right hand swelling that started approximately one week ago and has not been getting better. He has had episodes like this one that he experienced in the past. His hand feels tight, warm to touch, and the range of motion is limited due to pain. Previous episodes of similar flares occured at the knees level bilaterally. To patient this episode seems similar to his previous gout flare. For his gout flare he used to get a steroid shot and the pain would resolve within about a week. He denies fever or chills, no recent illnesses or hospitalizations, no abdominal or urinary concerns, no blood in urine or stool. Patient also denies any systemic symptoms such as weight loss, night sweat. She says that he has neck taken blood pressure medication and got the medication in the past 3 days. He went to a trip and forgot the medications.  Review of Systems  Constitutional: Negative.   HENT: Negative.   Eyes: Negative.   Respiratory: Negative.   Cardiovascular: Negative.   Genitourinary: Negative.   Musculoskeletal: Positive for joint swelling. Negative for myalgias, back pain, arthralgias and gait problem.  Hematological: Negative.   Psychiatric/Behavioral: Negative.        Objective:   Physical Exam  Constitutional: He appears well-developed and well-nourished. No distress.  HENT:  Head: Normocephalic and atraumatic.  Right Ear: External ear normal.  Left Ear: External ear normal.  Nose: Nose normal.  Mouth/Throat: Oropharynx is clear and moist. No oropharyngeal exudate.  Eyes: Conjunctivae and EOM are normal. Pupils are equal, round, and reactive to light. Right eye exhibits no discharge. Left eye exhibits no discharge. No scleral icterus.  Neck: Normal range of motion. Neck supple. No JVD present.  No tracheal deviation present. No thyromegaly present.  Cardiovascular: Normal rate, regular rhythm and intact distal pulses.  Exam reveals no gallop and no friction rub.   No murmur heard. Pulmonary/Chest: Effort normal and breath sounds normal. No stridor. No respiratory distress. He has no wheezes. He has no rales. He exhibits no tenderness.  Abdominal: Soft. Bowel sounds are normal. He exhibits no distension and no mass. There is no tenderness. There is no rebound and no guarding.  Musculoskeletal:       Right elbow: He exhibits swelling. He exhibits no effusion, no deformity and no laceration. tenderness found. Radial head, medial epicondyle, lateral epicondyle and olecranon process tenderness noted.       Right forearm: He exhibits tenderness and swelling. He exhibits no bony tenderness, no edema, no deformity and no laceration.       Left forearm: He exhibits no tenderness, no bony tenderness, no swelling, no edema, no deformity and no laceration.       Arms:      Swelling in th above area, L >.R  Lymphadenopathy:    He has no cervical adenopathy.  Skin: He is not diaphoretic.  Psychiatric: He has a normal mood and affect. His behavior is normal. Judgment and thought content normal.

## 2010-09-09 NOTE — Assessment & Plan Note (Signed)
This was determined to be secondary to ALT therapy. We will check fasting lipid panel today.

## 2010-09-09 NOTE — Assessment & Plan Note (Signed)
Blood pressure today is slightly above the goal, we will check electrolyte panel today to ensure that potassium and sodium are within normal limits. We'll make no changes to medication regimen at the time. Continue HCTZ and Diovan.

## 2010-09-10 ENCOUNTER — Other Ambulatory Visit (HOSPITAL_COMMUNITY): Payer: Medicaid Other

## 2010-09-10 ENCOUNTER — Other Ambulatory Visit: Payer: Self-pay | Admitting: Internal Medicine

## 2010-09-10 ENCOUNTER — Encounter: Payer: Self-pay | Admitting: Internal Medicine

## 2010-09-10 ENCOUNTER — Ambulatory Visit (HOSPITAL_COMMUNITY)
Admission: RE | Admit: 2010-09-10 | Discharge: 2010-09-10 | Disposition: A | Discharge status: Home / Self Care | Payer: Medicare Other | Source: Ambulatory Visit | Attending: Internal Medicine | Admitting: Internal Medicine

## 2010-09-10 DIAGNOSIS — R609 Edema, unspecified: Secondary | ICD-10-CM

## 2010-09-10 DIAGNOSIS — M79609 Pain in unspecified limb: Secondary | ICD-10-CM

## 2010-09-10 DIAGNOSIS — M109 Gout, unspecified: Secondary | ICD-10-CM

## 2010-09-10 DIAGNOSIS — R52 Pain, unspecified: Secondary | ICD-10-CM

## 2010-09-10 LAB — CD4/CD8 (T-HELPER/T-SUPPRESSOR CELL)
CD4%: 24 % — ABNORMAL LOW (ref 30.0–60.0)
CD8 T Cell Abs: 880 /uL (ref 230–1000)
Total lymphocyte count: 1580 /uL (ref 1000–4000)

## 2010-09-11 ENCOUNTER — Encounter: Payer: Self-pay | Admitting: Infectious Diseases

## 2010-09-17 NOTE — Miscellaneous (Signed)
  Clinical Lists Changes  Observations: Added new observation of FLU VAX: Historical (09/04/2010 15:37)      Immunization History:  Influenza Immunization History:    Influenza:  historical (09/04/2010)

## 2010-09-19 ENCOUNTER — Other Ambulatory Visit: Payer: Self-pay | Admitting: *Deleted

## 2010-09-20 MED ORDER — DICLOFENAC SODIUM 75 MG PO TBEC: 75.0000 mg | DELAYED_RELEASE_TABLET | Freq: Two times a day (BID) | ORAL | Status: DC

## 2010-09-20 MED ORDER — PHENYTOIN SODIUM EXTENDED 100 MG PO CAPS: 400.0000 mg | ORAL_CAPSULE | Freq: Every day | ORAL | Status: DC

## 2010-09-20 NOTE — Telephone Encounter (Signed)
Last refill on dilantin 1/12 # 120

## 2010-10-09 LAB — T-HELPER CELL (CD4) - (RCID CLINIC ONLY)
CD4 % Helper T Cell: 28 % — ABNORMAL LOW (ref 33–55)
CD4 T Cell Abs: 610 uL (ref 400–2700)

## 2010-10-22 LAB — T-HELPER CELL (CD4) - (RCID CLINIC ONLY): CD4 % Helper T Cell: 26 % — ABNORMAL LOW (ref 33–55)

## 2010-11-05 LAB — T-HELPER CELL (CD4) - (RCID CLINIC ONLY)
CD4 % Helper T Cell: 25 % — ABNORMAL LOW (ref 33–55)
CD4 T Cell Abs: 530 uL (ref 400–2700)

## 2010-12-06 NOTE — Op Note (Signed)
Darrell Johnson, Darrell Johnson              ACCOUNT NO.:  [id number]   MEDICAL RECORD NO.:  [id number]          PATIENT TYPE:  AMB   LOCATION:  DAY                          FACILITY:  [facility]   PHYSICIAN:  Lillette Boxer. Dahlstedt, M.D.DATE OF BIRTH:  Sep 30, 1965   DATE OF PROCEDURE:  01/22/2006  DATE OF DISCHARGE:                                 OPERATIVE REPORT   PREOPERATIVE DIAGNOSIS:  1.  Phimosis.  2.  Penile condylomata.   PROCEDURE:  Circumcision, cauterization of penile condylomata.   SURGEON:  Lillette Boxer. Dahlstedt, M.D.   ANESTHESIA:  General, local infiltration of the penis.   COMPLICATIONS:  None.   SPECIMEN:  Foreskin.   ESTIMATED BLOOD LOSS:  Minimal.   BRIEF HISTORY:  A 45 year old male with HIV.  He has significant problems  with tearing of his foreskin during intercourse.  Additionally, he has some  small condylomata on his foreskin and actually distal to his foreskin.  He  has requested circumcision.  The circumcision will alleviate most of the  warts but some of these need to be cauterized.  We will through these at the  same time.  He is aware of risks and complications of the procedure and  desires to proceed.   DESCRIPTION OF PROCEDURE:  The patient was administered preoperative IV  antibiotics and taken to the operating room where general anesthetic was  administered.  Genitalia and perineum were prepped and draped.  Ten mL of 4%  plain Marcaine was used for dorsal penile block.  Circumcising incisions  were made in the proximal and distal foreskin.  The foreskin was excised.  Several small condyloma were left at the frenulum and also in the dorsum of  the penis distally just proximal to the glanular ridge.  These were  cauterized.  Quadrant sutures of 4-0  chromic were placed in simple fashion.  In between these, the foreskin edges  were reapproximated using a running 4-0 chromic in a simple fashion.  The  usual dressing of Vaseline gauze, Kling and Coban were  applied.   The patient tolerated the procedure well.  He was awakened and taken to the  PACU in stable condition.      Lillette Boxer. Dahlstedt, M.D.  Electronically Signed     SMD/MEDQ  D:  01/22/2006  T:  01/22/2006  Job:  [id number]   cc:   Doroteo Bradford. Johnnye Sima, M.D.  Fax: [phone]

## 2010-12-06 NOTE — Discharge Summary (Signed)
[place]  Patient:    Darrell Johnson, BOTSCH                     MRN: [id number] Adm. Date:  [id number] Disc. Date: [id number] Attending:  Axel Filler Dictator:   Basilio Cairo, M.D. CC:         Odis Hollingshead, M.D.   Discharge Summary  DISCHARGE DIAGNOSES: 1. Peroneal abscess. 2. Human immunodeficiency virus. 3. History of seizure disorder.  DISCHARGE MEDICATIONS: 1. Tylox for pain, take one p.o. q.6h. p.r.n. pain. 2. Dilantin, resume the plan worked out with primary care physician.    (Darrell Johnson informed me that he and Darrell Johnson had worked out a plan to     taper his Dilantin and to see how he did off of his Dilantin).  Therefore     he reported he was only taking it sporadically). 3. Septra DS one p.o. q.d. 4. Augmentin 875 mg p.o. b.i.d. for 12 additional days.  FOLLOW-UP APPOINTMENT:  Mr. Zibell is to follow-up with Dr. Zella Richer in oneo week and with Darrell Johnson on October 26, 2000, at 9:50 a.m.  PROCEDURES:  CT of the pelvis and abdomen on October 01, 2000, which revealed a negative CT of the abdomen and an ill-defined abscess in the peroneal region with no connection to the rectum.  There was no evidence for intrapelvic abscess.  CONSULTATIONS:  Dr. Odis Hollingshead, general surgery was consulted for I&D of the peroneal abscess.  CHIEF COMPLAINT:  One week history of swelling behind the testicles and pain while defecating.  HISTORY OF PRESENT ILLNESS:  This is a 45 year old male with HIV, last CD4 count 150 and viral load 27,000 in February 2001 with a one week history of swelling behind his testicles and pain on defecation.  He reported no pain while lifting the scrotum or testicles and no difficulty with urinating.  No melena or hematochezia.  A low grade fever was noted on presentation but no rigors.  PAST MEDICAL HISTORY: 1. Human immunodeficiency virus, currently off medications x 3 weeks because    of resistance  to medications.    a. History of esophageal candidiasis. 2. Seizure disorder.  MEDICATIONS: 1. Dilantin. 2. Septra.  SOCIAL HISTORY:  Darrell Johnson lives with his wife in [place].  FAMILY HISTORY:  Noncontributory.  REVIEW OF SYSTEMS:  Negative for double or blurry vision, hearing changes, cough, chest pain, shortness of breath, nausea, vomiting, diarrhea, constipation, bright red blood per rectum, melena, and dysuria.  PHYSICAL EXAMINATION:  VITAL SIGNS:  Temperature 100, pulse 111, blood pressure 157/93.  GENERAL:  African-American male, alert and oriented x 4 in no acute distress.  HEENT:  Pupils are equal, round and reactive to light.  Extraocular muscles intact.  ENT unremarkable.  RESPIRATORY:  Clear to auscultation bilaterally.  CARDIOVASCULAR:  Tachycardiac with no murmur, rub, or gallop.  ABDOMEN:  Soft, nontender, bowel sounds present.  EXTREMITIES:  No pedal edema.  SKIN:  No rash.  GENITOURINARY:  Per resident evaluated in clinic (the patient refused for admitting resident to repeat), no color changes, no swelling in scrotum.  The area behind the scrotum revealed a firm area that was tender, no fluctuations.  RECTAL:  Exam without prostate tenderness.  LABORATORY DATA:  CBC with differential on admission, white count 10.9, hemoglobin 13.0, MCV 94.6, platelet count 294,000.  Chemistry profile: Sodium 132, potassium 3.6, chloride 103, bicarbonate 22, glucose 91, BUN 13, creatinine 0.9, total bilirubin  0.4, alkaline phosphatase 47, SGOT 21, SGPT 21, total protein 7.2, albumin 2.8 and calcium 8.5.  PT 12.3, INR 0.9.  PTT 31.  Urinalysis greater than 300 protein, 6-10 white blood cells, rare bacteria and mucous with a few squamous epithelials.  Nitrate and leukocyte esterase negative.  CT as described above in the procedure section.  HOSPITAL COURSE: #1 - PERONEAL ABSCESS:  Darrell Johnson was noted to have an abscess in his peroneal area by CT.  Surgery  was consulted the same day of admission on October 01, 2000, and he was taken to the OR for an incision and drainage of that abscess.  He did well following the procedure and was discharged within two days of admission with a course of Augmentin 875 mg b.i.d. for 12 days.  He was also instructed to follow with Dr. Zella Richer in one week for examination of the surgical site.  Dr. Zella Richer instructed him to use warm water soaks twice a day.  #2 - HUMAN IMMUNODEFICIENCY VIRUS:  Darrell Johnson was discharged home on his previous regimen of Septra DS one tablet per day for PCP prophylaxis.  Per discussion with his primary care physician, Mr. Deblasio is not currently on any HIV therapy as he had poor compliance in the past and therefore has developed resistance noted on _____ typing.  He has one protocol left that he can use, and this is being held off so that he can be used in the event Mr. Butner develops complications.  #3 - HISTORY OF SEIZURE DISORDER:  Darrell Johnson reports that he and his primary care physician who worked out a plan to taper his Dilantin and to see how he did without using the Dilantin at all.  Therefore, he was instructed to resume that plan of taking his Dilantin on discharge.  He reported that he had only been taking it sporadically and that ultimately he was going to go off of it completely to see how he would do off of it.  DISCHARGE LABORATORY DATA: CBC 6.7, hemoglobin 11.7, MCV 94.1, platelet count 233,000.  Basic metabolic profile: Sodium Q000111Q, chloride 99, bicarbonate 25, glucose 96, BUN 11, creatinine 1.1., calcium 8.4.  Culture noted with moderate Escherichia coli and aerobic culture with moderate Prevotella biviua.   Beta lactonase positive.  Blood cultures negative x 3. days.  DISCHARGE CONDITION:  Darrell Johnson was discharged to home in good condition. DD:  10/08/00 TD:  10/09/00 Job: [id number]

## 2010-12-06 NOTE — Consult Note (Signed)
[place]. [facility]  Patient:    Darrell Johnson, Darrell Johnson                     MRN: [id number] Proc. Date: 10/01/00 Adm. Date:  [id number] Disc. Date: [id number] Attending:  Tyrone Apple CC:         Axel Filler, M.D.   Consultation Report  REFERRING PHYSICIAN:  Axel Filler, M.D.  REASON FOR CONSULTATION:  Perineal fullness/abscess.  HISTORY OF PRESENT ILLNESS:  This is a 45 year old male with HIV.  For approximately one week, he had noted some pain after defection and fullness in the perineal area.   He was seen in the emergency department on March 12, given oral antibiotic and pain medication, but has not improved.  He was seen in the internal medicine clinic this evening and was examined and workup CT scan, which demonstrated the perineal abscess.  I was asked to see him after that.  He does have a fever of 100 degrees.  PAST MEDICAL HISTORY: 1. HIV infection. 2. Candidiasis. 3. Seizure disorder.  PAST SURGICAL HISTORY:  ______ none reported.  MEDICATIONS: 1. Dilantin 200 mg b.i.d. 2. Septra.  SOCIAL HISTORY:  He smokes 1/2 pack of cigarettes per day.  He has an occasional alcoholic beverage.  He lives with his wife.  REVIEW OF SYSTEMS:  CARDIOVASCULAR: He denies any hypertension or heart disease.  PULMONARY: He denies any pulmonary sequelae of HIV including pneumonia.  GASTROINTESTINAL: He denies diarrhea, constipation or anorectal trauma.  PHYSICAL EXAMINATION:  GENERAL:  Uncomfortable male.  VITAL SIGNS:  Temperature 101.2 degrees, pulse 102, blood pressure 158/98.  RECTAL:  In the anorectal region in the perineal body, there is a firm, indurated, very tender area.  It does not appear to go into the endoderm or extend up to the scrotum.  IMPRESSION:  Perineal abscess.  He does have a low CD-4 count and a high viral load.  He has been started on IV antibiotics.  PLAN:  To the operating room for incision and drainage.  The  procedure and risks including but not limited to bleeding and the risk of anesthesia were explained to him.  I also discussed the risk of prolonged wound healing, given his low CD-4 count and his HIV status with immunosuppression.  Both he and his wife seem to understand and agree to proceed. DD:  10/01/00 TD:  10/02/00 Job: [id number]

## 2010-12-06 NOTE — Op Note (Signed)
[place]  Patient:    Darrell Johnson, Darrell Johnson                     MRN: [id number] Proc. Date: 10/01/00 Adm. Date:  [id number] Disc. Date: [id number] Attending:  Tyrone Apple                           Operative Report  PREOPERATIVE DIAGNOSIS:  Perineal abscess.  POSTOPERATIVE DIAGNOSIS:  Perineal abscess.  OPERATION:  Complex incision and drainage of perineal abscess.  SURGEON:  Odis Hollingshead, M.D.  ANESTHESIA:  General.  INDICATIONS:  The patient is a 44 year old male, who has been having increasing pain and swelling in the perineum.  He has an obvious abscess by examination and is confirmed by CT.  He now presents for incision and drainage.  TECHNIQUE:  The patient was placed supine on the operating table, and general anesthetic was administered.  He was then placed in a lithotomy position and the perianal area was sterilely prepped and draped.  I palpated the indurated area and using a 19-gauge needle found the pocket of purulent material.  I subsequently made a triangular incision, with full-thickness and went all the way into the cavity around the needle.  I drained a fair amount of purulent material from this cavity and sent some for culture.  I subsequently drained all the material and then stuck a 0.25 inch Penrose drain into the cavity and anchored it to some of the muscle.  I then covered this with a bulky dressing. I did have to stick my finger into the cavity and breakup loculations.  He tolerated this procedure well without any apparent complications and was taken to the recovery room in satisfactory condition.  Postoperatively, he will need to do warm water soaks and wear a pad in underwear. DD:  10/01/00 TD:  10/02/00 Job: [id number]

## 2010-12-06 NOTE — Discharge Summary (Signed)
NAME:  Darrell Johnson, Darrell Johnson                        ACCOUNT NO.:  [id number]   MEDICAL RECORD NO.:  [id number]                   PATIENT TYPE:  INP   LOCATION:  5702                                 FACILITY:  [facility]   PHYSICIAN:  Doroteo Bradford. Johnnye Sima, M.D.            DATE OF BIRTH:  04/11/1966   DATE OF ADMISSION:  08/05/2002  DATE OF DISCHARGE:  08/18/2002                                 DISCHARGE SUMMARY   DISCHARGE DIAGNOSES:  1. Tuberculosis.  2. Human immunodeficiency virus/acquired immunodeficiency syndrome.  3. Hypertension.  4. Tobacco abuse.  5. Distant history of seizure disorder secondary to substance abuse.   DISCHARGE MEDICATIONS:  1. Septra DS Monday,Wednesday, and Friday.  2. Isoniazid 300 mcg p.o. q.d.  3. Pyridoxine 50 mcg p.o. q.d.  4. Pyrazinamide 40 1500 mg p.o. q.d.  5. Rifabutin 150 mg Monday, Wednesday, and Friday.  6. Ethambutol 1500 mg p.o. q.d.  7. Tenofovir 300 mg p.o. q.d.  8. Zidovudine 300 mg p.o. q.d.  9. Kaletra 3 tablets p.o. q.d.  10.      Loratadine 1 tablet p.o. q.d. p.r.n.  11.      Oxycodone 1 to 2 tablets q.4h. p.r.n. pain.   FOLLOW UP:  The patient is followed by the [facility], Attn:  [name], [phone], with home visits for direct observe therapy and  medication.  The patient is also to follow up Dr. Barbette Merino, September 15, 2002 at 10 a.m. in [facility].   BRIEF ADMISSION NOTE:  The patient is a 45 year old male with AIDS,  hypertension and seizure disorder who presented to the ER complaining of  left chest pain that had worsened over the past week.  He reported that he  had a cold in November 2003 and had been treated with antibiotics but never  cleared.  He was given Augmentin and Allegra the week prior to admission in  [facility] for a sinus infection but that did not resolve either.  The patient  reports cough productive of white phlegm.  Left sided chest pain.  No  shortness of breath.   No sore throat.  No headaches.  No dizziness.  No  nausea, vomiting or diarrhea.  No night sweats.  The patient had recently  been restarted on HAART therapy in October 2003 after previously being off  the therapy since May 2003.  He had also recently been started on PCP  prophylaxis.   On admission pulse 131, blood pressure 153/96, temperature 99.3,  respirations 24, saturation 98% on room air.  A thin male in no acute  distress.  Physical exam was significant for egophony right greater than  left.  Tenderness to palpation on the left chest in the mid axillary region  to the back.   Chest x-ray showed bilateral pleural effusions, right greater than left.   The Dilantin level was less than 2.5.   The  patient was admitted and placed in respiratory isolation to rule out  tuberculosis.   HOSPITAL COURSE:  #1 - TUBERCULOSIS:  On August 07, 2002 the patient's  sputum came positive for AFB.  Past history, he had been exposed to TB in  the past while in jail.  The patient was started on four drug therapy.  The  diagnosis was confirmed when the second culture came back positive.  On  August 10, 2002 the patient underwent a left thoracentesis which he  tolerated well.  1.7 liters were taken off and the patient from that point  on experienced much less chest pain.  The patient was sent home after  coordination with the [facility] for the patient to receive his  direct observe therapy.   #2 - HUMAN IMMUNODEFICIENCY VIRUS:  The patient's CD4 count was 140 on  August 09, 2002.  After evaluation of the patient, including HIV genotyping  and discussion with Dr. Johnnye Sima, the patient was restarted on Kaletra,  Tenofovir, and AZT on August 15, 2002.  The patient tolerated these  medicines well and was discharged on them.   #3 - HYPERTENSION:  The patient was admitted on HCTZ; however, he  experienced some orthostatic hypotension on day two of admission and the  HCTZ was discontinued  throughout the rest of the hospitalization.  He  actually had no problems with his blood pressure and he was sent home  without the medication at this time.   #4 - HISTORY OF SEIZURE DISORDER:  The patient came in with a subtherapeutic  Dilantin level.  Discussion with the patient and evaluation of his old  records, lead to the determination that the patient did not experience any  seizure since he had seized his substance abuse.  The Dilantin was  discontinued and he was sent home without it.   DISPOSITION:  The patient was discharged home on August 18, 2002 in stable  condition.       Osie Cheeks, M.D.                      Doroteo Bradford. Johnnye Sima, M.D.    BT/MEDQ  D:  09/19/2002  T:  09/20/2002  Job:  [id number]

## 2011-01-14 ENCOUNTER — Encounter: Payer: Medicare Other | Admitting: Internal Medicine

## 2011-01-21 ENCOUNTER — Encounter: Payer: Medicare Other | Admitting: Internal Medicine

## 2011-02-17 ENCOUNTER — Other Ambulatory Visit: Payer: Self-pay | Admitting: *Deleted

## 2011-02-18 MED ORDER — ALLOPURINOL 100 MG PO TABS: 100.0000 mg | ORAL_TABLET | Freq: Every day | ORAL | Status: DC

## 2011-02-18 MED ORDER — RALTEGRAVIR POTASSIUM 400 MG PO TABS: 400.0000 mg | ORAL_TABLET | Freq: Two times a day (BID) | ORAL | Status: DC

## 2011-03-04 ENCOUNTER — Ambulatory Visit (INDEPENDENT_AMBULATORY_CARE_PROVIDER_SITE_OTHER): Payer: Medicare Other | Admitting: Internal Medicine

## 2011-03-04 ENCOUNTER — Encounter: Payer: Self-pay | Admitting: Internal Medicine

## 2011-03-04 VITALS — BP 153/88 | HR 94 | Temp 98.8°F | Resp 20 | Ht 70.0 in | Wt 235.7 lb

## 2011-03-04 DIAGNOSIS — J309 Allergic rhinitis, unspecified: Secondary | ICD-10-CM | POA: Insufficient documentation

## 2011-03-04 DIAGNOSIS — M109 Gout, unspecified: Secondary | ICD-10-CM

## 2011-03-04 DIAGNOSIS — I10 Essential (primary) hypertension: Secondary | ICD-10-CM

## 2011-03-04 DIAGNOSIS — B350 Tinea barbae and tinea capitis: Secondary | ICD-10-CM | POA: Insufficient documentation

## 2011-03-04 DIAGNOSIS — B20 Human immunodeficiency virus [HIV] disease: Secondary | ICD-10-CM

## 2011-03-04 DIAGNOSIS — J329 Chronic sinusitis, unspecified: Secondary | ICD-10-CM

## 2011-03-04 MED ORDER — AMOXICILLIN-POT CLAVULANATE 875-125 MG PO TABS: 1.0000 | ORAL_TABLET | Freq: Two times a day (BID) | ORAL | Status: AC

## 2011-03-04 MED ORDER — AZELASTINE HCL 0.15 % NA SOLN: NASAL | Status: DC

## 2011-03-04 MED ORDER — HYDROCODONE-ACETAMINOPHEN 5-500 MG PO TABS: 1.0000 | ORAL_TABLET | Freq: Three times a day (TID) | ORAL | Status: DC | PRN

## 2011-03-04 MED ORDER — TERBINAFINE HCL 250 MG PO TABS: 250.0000 mg | ORAL_TABLET | Freq: Every day | ORAL | Status: DC

## 2011-03-04 MED ORDER — LORATADINE 10 MG PO TABS: 10.0000 mg | ORAL_TABLET | Freq: Every day | ORAL | Status: DC

## 2011-03-04 NOTE — Assessment & Plan Note (Signed)
Again history is consistent with allergic rhinitis for his upper respiratory symptoms. Patient has tried Flonase in the past however his insurance would not cover for this medication therefore I will try Astepro 2 spray in each nostril twice a day. Patient was also instructed to rinse his nose with the [facility] prior to using Astepro sprays for better penetration.  In addition will prescribe Claritin 10 mg by mouth daily.

## 2011-03-04 NOTE — Assessment & Plan Note (Signed)
Patient has 2 lesions on left and right side of his head that is scaly/patchy to measure her 3 x 3 cm. Patient also admits to having pruritus which is slightly consistent with tenia capitis. Patient was treated in the past (about 1 year ago) with resolution of the lesion however after he stopped the medication the lesion came back. Before, he only had one lesion on his right side of the head; however, now there is another lesion appear on the left side of his head in the past several months. -Will try a trial of Lamisil 250 mg by mouth daily x 4 weeks given that his LFTs are within normal limits. -Will followup with patient in 2-3 weeks

## 2011-03-04 NOTE — Progress Notes (Signed)
History of present illness: [name] is a 45 year old man with past medical history HIV, hyper triglyceridemia, gout, hypertension, elevated alkaline phosphatase presents today for a head cold x3 weeks in duration.  Patient complains of sore throat, productive cough that was yellow in color but now is white, postnasal drip, and some tenderness to his maxillary sinus.  He states that he used Flonase in the past which helped however Medicare stopped paying for it.  He has also been using Zyrtec over-the-counter with some relief. He states that he normally get allergic symptoms including nasal drainage, itchy eyes, sneezing when spring comes.  However he denies any fever, chills, nausea, vomiting or any other systemic symptoms.   -He also has gout flare in his knees bilaterally however left greater than right in the past 2 weeks as well. He ran out of his allopurinol medication therefore has not been taking it consistently. He took half a tablet op OxyContin of his brother this morning however he states that this medication is too strong for him. He has not been taking any other NSAIDs secondary to his chronic renal failure. -He also complained of too silvery scale patchy areas on his head x one year in duration. He was treated in the past with resolution however the " ringworm" came back. He admits to pruritus and states that he has used some over-the-counter medicated shampoo without much relief. -As for his hypertension, patient also ran out of his medication recently; therefore, he has missed a few doses of his blood pressure medication. -For his HIV, patient has been taking his ART medication however he has not been seen by Dr. Johnnye Sima in over a year.    Review of system: As per history of present illness  Physical examination: General: alert, well-developed, and cooperative to examination.  Head: normocephalic and atraumatic. 2 silvery/patchy scale lesion 3x3 cm without any  erythema/drainage/tenderness.  Face: mild tenderness to maxillary & frontal sinuses   Lungs: normal respiratory effort, no accessory muscle use, normal breath sounds, no crackles, and no wheezes. Heart: normal rate, regular rhythm, no murmur, no gallop, and no rub.  Abdomen: soft, non-tender, normal bowel sounds, no distention, no guarding, no rebound tenderness, no hepatomegaly, and no splenomegaly.  Msk: no joint swelling, no joint warmth, and no redness over joints.  Pulses: 2+ DP/PT pulses bilaterally Extremities: No cyanosis, clubbing, edema.  Limited ROM 2/2 pain, no erythema/effusion noted on knees bilaterally.  No audible clicks appreciated.   Neurologic: alert & oriented X3, cranial nerves II-XII intact, strength normal in all extremities, sensation intact to light touch, and gait normal.   Psych: Oriented X3, memory intact for recent and remote, normally interactive, good eye contact, not anxious appearing, and not depressed appearing.

## 2011-03-04 NOTE — Assessment & Plan Note (Signed)
Patient complains of bilateral knee pain in which he thinks it is a gout flare. During his last office visit in February of 2012, his U. acid was slightly elevated at 8.6. He has been out of his allopurinol and recently had a refill. He states that he took one half tablet of OxyContin from his brother which relieved his pain however he thinks that the medication is too strong for him.  Given his chronic renal failure with a baseline creatinine of 1.5-1.8 therefore we will avoid NSAIDs -Will give patient Lortab 5/500 mg every 4-6 hours when necessary pain #90. -Will continue allopurinol -Will not check a uric acid level today

## 2011-03-04 NOTE — Assessment & Plan Note (Signed)
Physical examination as well as clinical history is consistent with sinusitis. I will treat him with Augmentin 875 mg by mouth twice a day x14 days. Will followup in 2-3 weeks

## 2011-03-04 NOTE — Patient Instructions (Signed)
-  Please buy netty pot and rinse your nose before using the Astepro spray so that the medication penetrates better, spray 2 sprays in each nostril two times daily -Take Claritin 10mg  one tablet daily -You can take Hydrocodone one tablet every 4-6 hours as needed for your joint pain -Take Augmentin 875mg  one tablet twice daily x 14 days -Follow up in 2-3 weeks if no improvement

## 2011-03-04 NOTE — Assessment & Plan Note (Signed)
Patient has not been seen by Dr. Johnnye Sima in over a year. His last CD4 count in February of 2012 was 380 with a viral load of 304. I instructed patient to make an appointment to see Dr. Johnnye Sima as soon as possible. -Will continue his current medication regimen

## 2011-03-04 NOTE — Assessment & Plan Note (Signed)
Not well controlled however patient has not been compliant with his medication. I would not change his regimen today and will continue hydrochlorothiazide 25 mg by mouth daily and Diovan 320 mg daily. -I will continue to monitor his blood pressure in 2 weeks

## 2011-03-11 ENCOUNTER — Other Ambulatory Visit: Payer: Self-pay | Admitting: Internal Medicine

## 2011-03-18 NOTE — Telephone Encounter (Signed)
Will avoid NSAIDs given baseline creatinine 1.5-1.8

## 2011-04-23 ENCOUNTER — Other Ambulatory Visit: Payer: Medicare Other

## 2011-04-23 LAB — T-HELPER CELL (CD4) - (RCID CLINIC ONLY)
CD4 % Helper T Cell: 25 — ABNORMAL LOW
CD4 T Cell Abs: 600

## 2011-04-29 LAB — T-HELPER CELL (CD4) - (RCID CLINIC ONLY): CD4 % Helper T Cell: 19 — ABNORMAL LOW

## 2011-05-07 ENCOUNTER — Ambulatory Visit (INDEPENDENT_AMBULATORY_CARE_PROVIDER_SITE_OTHER): Payer: Medicare Other | Admitting: Infectious Diseases

## 2011-05-07 ENCOUNTER — Encounter: Payer: Self-pay | Admitting: Infectious Diseases

## 2011-05-07 VITALS — BP 191/111 | HR 87 | Temp 98.1°F | Ht 71.0 in | Wt 239.0 lb

## 2011-05-07 DIAGNOSIS — E781 Pure hyperglyceridemia: Secondary | ICD-10-CM

## 2011-05-07 DIAGNOSIS — M109 Gout, unspecified: Secondary | ICD-10-CM

## 2011-05-07 DIAGNOSIS — Z113 Encounter for screening for infections with a predominantly sexual mode of transmission: Secondary | ICD-10-CM

## 2011-05-07 DIAGNOSIS — I10 Essential (primary) hypertension: Secondary | ICD-10-CM

## 2011-05-07 DIAGNOSIS — Z23 Encounter for immunization: Secondary | ICD-10-CM

## 2011-05-07 DIAGNOSIS — B20 Human immunodeficiency virus [HIV] disease: Secondary | ICD-10-CM

## 2011-05-07 LAB — COMPREHENSIVE METABOLIC PANEL
ALT: 21 U/L (ref 0–53)
AST: 24 U/L (ref 0–37)
Albumin: 3.8 g/dL (ref 3.5–5.2)
Alkaline Phosphatase: 122 U/L — ABNORMAL HIGH (ref 39–117)
Glucose, Bld: 97 mg/dL (ref 70–99)
Potassium: 3.8 mEq/L (ref 3.5–5.3)
Sodium: 138 mEq/L (ref 135–145)
Total Bilirubin: 0.4 mg/dL (ref 0.3–1.2)
Total Protein: 6.4 g/dL (ref 6.0–8.3)

## 2011-05-07 LAB — CBC WITH DIFFERENTIAL/PLATELET
Basophils Absolute: 0 10*3/uL (ref 0.0–0.1)
Basophils Relative: 0 % (ref 0–1)
HCT: 43.3 % (ref 39.0–52.0)
Hemoglobin: 15.5 g/dL (ref 13.0–17.0)
Lymphocytes Relative: 22 % (ref 12–46)
MCHC: 35.8 g/dL (ref 30.0–36.0)
Monocytes Relative: 9 % (ref 3–12)
Neutro Abs: 5.3 10*3/uL (ref 1.7–7.7)
Neutrophils Relative %: 66 % (ref 43–77)
WBC: 8 10*3/uL (ref 4.0–10.5)

## 2011-05-07 LAB — LIPID PANEL
LDL Cholesterol: 129 mg/dL — ABNORMAL HIGH (ref 0–99)
Triglycerides: 242 mg/dL — ABNORMAL HIGH (ref <150)
VLDL: 48 mg/dL — ABNORMAL HIGH (ref 0–40)

## 2011-05-07 LAB — RPR

## 2011-05-07 MED ORDER — HYDROCODONE-ACETAMINOPHEN 5-500 MG PO TABS: 1.0000 | ORAL_TABLET | Freq: Three times a day (TID) | ORAL | Status: DC | PRN

## 2011-05-07 NOTE — Progress Notes (Signed)
  Subjective:    Patient ID: Darrell Johnson, male    DOB: 05/11/1966, 45 y.o.   MRN: [id number]  HPI 45 year old man with past medical history HIV+, hyper triglyceridemia, gout, hypertension, elevated alkaline phosphatase -He also has gout flare in his knees bilaterally however left greater than right in the past 2 weeks as well. He ran out of his allopurinol medication therefore has not been taking it consistently. He took half a tablet op OxyContin of his brother this morning however he states that this medication is too strong for him. He has not been taking any other NSAIDs secondary to his chronic renal failure.  -He also complained of too silvery scale patchy areas on his head x one year in duration. He was treated in the past with resolution however the " ringworm" came back. He admits to pruritus and states that he has used some over-the-counter medicated shampoo without much relief.  -hypertension: has been taking intermittently. Has had sinus headaches. Has mild relief with claritin, nasal spray.  -HIV: last CD4 380, VL 304 (09-09-10). CBV/KLT/ISN. Has some dosing irregularity but has since corrected after reading the labels on his pills.      Review of Systems  Constitutional: Negative for fever, chills and unexpected weight change.  Gastrointestinal: Negative for diarrhea and constipation.  Genitourinary: Negative for dysuria.  Neurological: Positive for headaches.       Objective:   Physical Exam  Constitutional: He appears well-developed and well-nourished.  Eyes: EOM are normal. Pupils are equal, round, and reactive to light.  Neck: Neck supple.  Cardiovascular: Normal rate, regular rhythm and normal heart sounds.   Pulmonary/Chest: Effort normal and breath sounds normal.  Abdominal: Soft. Bowel sounds are normal. He exhibits no distension.  Lymphadenopathy:    He has no cervical adenopathy.          Assessment & Plan:

## 2011-05-07 NOTE — Assessment & Plan Note (Signed)
He has been taking his meds more regularly now. Will recheck his labs today. He is given condoms, flu shot. See him back in 4 months.

## 2011-05-07 NOTE — Assessment & Plan Note (Signed)
Will recheck his labs today.

## 2011-05-07 NOTE — Assessment & Plan Note (Signed)
His BP is elevated today but he is asx. He will take his medication when he gets home and resume taking it as scheduled.

## 2011-05-07 NOTE — Assessment & Plan Note (Signed)
Will refill his vicodin today. He asks for stronger rx but i suggested he f/u with dr Silverio Decamp so as not to violate his pain contract.

## 2011-05-08 LAB — T-HELPER CELL (CD4) - (RCID CLINIC ONLY)
CD4 % Helper T Cell: 29 % — ABNORMAL LOW (ref 33–55)
CD4 T Cell Abs: 580 uL (ref 400–2700)

## 2011-06-30 ENCOUNTER — Emergency Department (HOSPITAL_COMMUNITY)
Admission: EM | Admit: 2011-06-30 | Discharge: 2011-06-30 | Disposition: A | Discharge status: Home / Self Care | Payer: Medicare Other | Attending: Emergency Medicine | Admitting: Emergency Medicine

## 2011-06-30 ENCOUNTER — Encounter (HOSPITAL_COMMUNITY): Payer: Self-pay

## 2011-06-30 DIAGNOSIS — M25562 Pain in left knee: Secondary | ICD-10-CM

## 2011-06-30 DIAGNOSIS — I129 Hypertensive chronic kidney disease with stage 1 through stage 4 chronic kidney disease, or unspecified chronic kidney disease: Secondary | ICD-10-CM | POA: Insufficient documentation

## 2011-06-30 DIAGNOSIS — Z21 Asymptomatic human immunodeficiency virus [HIV] infection status: Secondary | ICD-10-CM | POA: Insufficient documentation

## 2011-06-30 DIAGNOSIS — Z79899 Other long term (current) drug therapy: Secondary | ICD-10-CM | POA: Insufficient documentation

## 2011-06-30 DIAGNOSIS — E785 Hyperlipidemia, unspecified: Secondary | ICD-10-CM | POA: Insufficient documentation

## 2011-06-30 DIAGNOSIS — R609 Edema, unspecified: Secondary | ICD-10-CM | POA: Insufficient documentation

## 2011-06-30 DIAGNOSIS — M109 Gout, unspecified: Secondary | ICD-10-CM

## 2011-06-30 DIAGNOSIS — G40909 Epilepsy, unspecified, not intractable, without status epilepticus: Secondary | ICD-10-CM | POA: Insufficient documentation

## 2011-06-30 DIAGNOSIS — M25469 Effusion, unspecified knee: Secondary | ICD-10-CM | POA: Insufficient documentation

## 2011-06-30 DIAGNOSIS — N182 Chronic kidney disease, stage 2 (mild): Secondary | ICD-10-CM | POA: Insufficient documentation

## 2011-06-30 DIAGNOSIS — M25569 Pain in unspecified knee: Secondary | ICD-10-CM | POA: Insufficient documentation

## 2011-06-30 MED ORDER — KETOROLAC TROMETHAMINE 60 MG/2ML IM SOLN
60.0000 mg | Freq: Once | INTRAMUSCULAR | Status: AC
  Administered 2011-06-30: 60 mg via INTRAMUSCULAR
  Filled 2011-06-30: qty 2

## 2011-06-30 MED ORDER — COLCHICINE 0.6 MG PO TABS: 0.6000 mg | ORAL_TABLET | Freq: Every day | ORAL | Status: DC

## 2011-06-30 MED ORDER — PREDNISONE 10 MG PO TABS: ORAL_TABLET | ORAL | Status: DC

## 2011-06-30 MED ORDER — OXYCODONE-ACETAMINOPHEN 5-325 MG PO TABS
1.0000 | ORAL_TABLET | Freq: Once | ORAL | Status: AC
  Administered 2011-06-30: 1 via ORAL
  Filled 2011-06-30: qty 1

## 2011-06-30 MED ORDER — OXYCODONE-ACETAMINOPHEN 5-325 MG PO TABS: 1.0000 | ORAL_TABLET | ORAL | Status: AC | PRN

## 2011-06-30 NOTE — ED Provider Notes (Signed)
History     CSN: [id number] Arrival date & time: 06/30/2011 12:24 PM   First MD Initiated Contact with Patient 06/30/11 1501      Chief Complaint  Patient presents with  . Leg Pain    (Consider location/radiation/quality/duration/timing/severity/associated sxs/prior treatment) HPI  Patient relates he's had gout for a long time. He's had pain in his left knee for a long time also. He states he's had Dr. Eddie Dibbles inject steroids in it at least twice before. He states last night he started getting more pain and swelling in the left knee and the pain is more centered medially. He denies any recent injury he denies any fever. He states normally of his gout he gets more swelling and indicates the suprapatellar region.  Primary care Dr. Silverio Decamp [facility]. Eddie Dibbles  Past Medical History  Diagnosis Date  . 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 Hypertension   . Seizures     last seizure was >5 years ago, pt has family history of seizures  . Syphilis 1997    history of syphilis 1997  . Chronic kidney disease     stage !! CKD, followed by Fr. Clover Mealy  . 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 Rib fractures 01/2009  . HIV infection 1980's    on ART therapy since, followed by ID clinic, complicated  by neuropathy  . 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 No pertinent past surgical history.  Family History  Problem Relation Age of Onset  . Cancer Mother   . COPD Father   . Diabetes Sister   . Hypertension Sister   . Diabetes Brother   . Hypertension Brother   . Stroke Neg Hx     History  Substance Use Topics  . Smoking status: Current Everyday Smoker -- 0.3 packs/day for 20 years    Types: Cigarettes  . Smokeless tobacco: Never Used  . Alcohol Use: No   drug  use denies    Review of Systems  All other systems reviewed and are negative.    Allergies  Review of patient's allergies indicates no known allergies.  Home Medications   Current Outpatient Rx  Name Route Sig Dispense Refill  . ALLOPURINOL 100 MG PO TABS Oral Take 1 tablet (100 mg total) by mouth daily. 30 tablet 6  . AZELASTINE HCL 0.15 % NA SOLN  2 sprays in each nostril twice daily 30 mL 6  . DICLOFENAC SODIUM 75 MG PO TBEC Oral Take 75 mg by mouth 2 (two) times daily.      Marland Kitchen HYDROCHLOROTHIAZIDE 25 MG PO TABS Oral Take 25 mg by mouth daily.      Marland Kitchen LAMIVUDINE-ZIDOVUDINE 150-300 MG PO TABS Oral Take 1 tablet by mouth 2 (two) times daily.      Marland Kitchen LOPINAVIR-RITONAVIR 200-50 MG PO TABS Oral Take 2 tablets by mouth 2 (two) times daily.      Marland Kitchen LORATADINE 10 MG PO TABS Oral Take 1 tablet (10 mg total) by mouth daily. 30 tablet 6  . PHENYTOIN SODIUM EXTENDED 100 MG PO CAPS  TAKE FOUR (4) CAPSULES ONCE A DAY 120 capsule 3  . RALTEGRAVIR POTASSIUM 400 MG PO TABS Oral Take 1 tablet (400 mg total) by mouth 2 (two) times daily. 60 tablet 3  .  VALSARTAN 320 MG PO TABS Oral Take 320 mg by mouth daily.      Patient states he has not taken the diflofenac for a month (his insurance quit paying for it)  BP 182/119  Pulse 85  Temp(Src) 98.1 F (36.7 C) (Oral)  Resp 20  SpO2 98% Vital signs show hypertension  Physical Exam  Nursing note and vitals reviewed. Constitutional: He is oriented to person, place, and time. He appears well-developed and well-nourished.  Non-toxic appearance. He does not appear ill. No distress.  HENT:  Head: Normocephalic and atraumatic.  Right Ear: External ear normal.  Left Ear: External ear normal.  Nose: Nose normal. No mucosal edema or rhinorrhea.  Mouth/Throat: Oropharynx is clear and moist and mucous membranes are normal. No dental abscesses or uvula swelling.  Eyes: Conjunctivae and EOM are normal. Pupils are equal, round, and reactive to light.  Neck:  Normal range of motion and full passive range of motion without pain. Neck supple.  Pulmonary/Chest: Effort normal and breath sounds normal. No respiratory distress. He has no rhonchi. He exhibits no crepitus.  Abdominal: Normal appearance.  Musculoskeletal: Normal range of motion. He exhibits edema and tenderness.       Patient's noted to have diffuse swelling of his left knee with small to moderate effusion. The skin is not red or warm to touch. He has mild swelling in the suprapatellar region. He indicates his medial joint space is to the source of most of his pain.  Neurological: He is alert and oriented to person, place, and time. He has normal strength. No cranial nerve deficit.  Skin: Skin is warm, dry and intact. No rash noted. No erythema. No pallor.  Psychiatric: He has a normal mood and affect. His speech is normal and behavior is normal. His mood appears not anxious.    ED Course  Procedures (including critical care time)   Medications  ketorolac (TORADOL) injection 60 mg (not administered)  oxyCODONE-acetaminophen (PERCOCET) 5-325 MG per tablet 1 tablet (not administered)    1. Gout flare   2. Left knee pain     New Prescriptions   COLCHICINE 0.6 MG TABLET    Take 1 tablet (0.6 mg total) by mouth daily.   OXYCODONE-ACETAMINOPHEN (PERCOCET) 5-325 MG PER TABLET    Take 1 tablet by mouth every 4 (four) hours as needed for pain.   PREDNISONE (DELTASONE) 10 MG TABLET    Take 3 po QD x 3d , then 2 po QD x 3d then 1 po QD x 3d   Plan discharge   [place], MD 06/30/11 1625

## 2011-06-30 NOTE — ED Notes (Signed)
Patient presents with left knee pain and swelling since today. Patient has a history of gout, denies recent injury, ambulatory in department.

## 2011-07-25 DIAGNOSIS — M23329 Other meniscus derangements, posterior horn of medial meniscus, unspecified knee: Secondary | ICD-10-CM | POA: Diagnosis not present

## 2011-07-25 DIAGNOSIS — M1710 Unilateral primary osteoarthritis, unspecified knee: Secondary | ICD-10-CM | POA: Diagnosis not present

## 2011-07-30 DIAGNOSIS — M1710 Unilateral primary osteoarthritis, unspecified knee: Secondary | ICD-10-CM | POA: Diagnosis not present

## 2011-08-04 ENCOUNTER — Other Ambulatory Visit: Payer: Self-pay | Admitting: Internal Medicine

## 2011-08-26 ENCOUNTER — Telehealth: Payer: Self-pay | Admitting: *Deleted

## 2011-08-26 ENCOUNTER — Other Ambulatory Visit: Payer: Self-pay | Admitting: Licensed Clinical Social Worker

## 2011-08-26 ENCOUNTER — Encounter: Payer: Medicare Other | Admitting: Internal Medicine

## 2011-08-26 DIAGNOSIS — M109 Gout, unspecified: Secondary | ICD-10-CM

## 2011-08-26 DIAGNOSIS — B20 Human immunodeficiency virus [HIV] disease: Secondary | ICD-10-CM

## 2011-08-26 MED ORDER — LAMIVUDINE-ZIDOVUDINE 150-300 MG PO TABS: 1.0000 | ORAL_TABLET | Freq: Two times a day (BID) | ORAL | Status: DC

## 2011-08-26 MED ORDER — LOPINAVIR-RITONAVIR 200-50 MG PO TABS: 2.0000 | ORAL_TABLET | Freq: Two times a day (BID) | ORAL | Status: DC

## 2011-08-26 MED ORDER — HYDROCODONE-ACETAMINOPHEN 5-500 MG PO TABS: 1.0000 | ORAL_TABLET | Freq: Three times a day (TID) | ORAL | Status: DC | PRN

## 2011-08-26 NOTE — Telephone Encounter (Signed)
I added an FYI bc I have a few concerns. Ran [place] database. Not on high quantities nor Q month but regularly. He never got Rx filled that Dr Silverio Decamp prescribed but did get from Dr Johnnye Sima and ER. Will fill but not #90. Has F/U Dr Silverio Decamp so the med necssity can be discussed then

## 2011-08-26 NOTE — Telephone Encounter (Signed)
Hydrocodone 5/500mg  rx called to [facility].

## 2011-08-26 NOTE — Telephone Encounter (Signed)
Requesting refill on : Hydrocodone-ACET 5/500mg   -  Take 1 tablet by mouth every 8 hrs as needed. Appt has been re-scheduled for 09/22/11 w/Dr. Silverio Decamp. Pt was instructed to call ID for refill on Combivir and Kaletra;he agreed.

## 2011-09-01 ENCOUNTER — Emergency Department (HOSPITAL_COMMUNITY)
Admission: EM | Admit: 2011-09-01 | Discharge: 2011-09-01 | Disposition: A | Discharge status: Home / Self Care | Payer: Medicare Other | Attending: Emergency Medicine | Admitting: Emergency Medicine

## 2011-09-01 ENCOUNTER — Encounter (HOSPITAL_COMMUNITY): Payer: Self-pay | Admitting: Emergency Medicine

## 2011-09-01 DIAGNOSIS — I129 Hypertensive chronic kidney disease with stage 1 through stage 4 chronic kidney disease, or unspecified chronic kidney disease: Secondary | ICD-10-CM | POA: Diagnosis not present

## 2011-09-01 DIAGNOSIS — R229 Localized swelling, mass and lump, unspecified: Secondary | ICD-10-CM | POA: Insufficient documentation

## 2011-09-01 DIAGNOSIS — B20 Human immunodeficiency virus [HIV] disease: Secondary | ICD-10-CM

## 2011-09-01 DIAGNOSIS — L02219 Cutaneous abscess of trunk, unspecified: Secondary | ICD-10-CM | POA: Diagnosis not present

## 2011-09-01 DIAGNOSIS — N182 Chronic kidney disease, stage 2 (mild): Secondary | ICD-10-CM | POA: Diagnosis not present

## 2011-09-01 DIAGNOSIS — A638 Other specified predominantly sexually transmitted diseases: Secondary | ICD-10-CM | POA: Diagnosis not present

## 2011-09-01 DIAGNOSIS — E785 Hyperlipidemia, unspecified: Secondary | ICD-10-CM | POA: Insufficient documentation

## 2011-09-01 DIAGNOSIS — R569 Unspecified convulsions: Secondary | ICD-10-CM | POA: Diagnosis not present

## 2011-09-01 DIAGNOSIS — L0291 Cutaneous abscess, unspecified: Secondary | ICD-10-CM

## 2011-09-01 DIAGNOSIS — Z21 Asymptomatic human immunodeficiency virus [HIV] infection status: Secondary | ICD-10-CM | POA: Diagnosis not present

## 2011-09-01 MED ORDER — CLINDAMYCIN PHOSPHATE 900 MG/50ML IV SOLN
900.0000 mg | INTRAVENOUS | Status: AC
  Administered 2011-09-01: 900 mg via INTRAVENOUS
  Filled 2011-09-01: qty 50

## 2011-09-01 MED ORDER — HYDROCODONE-ACETAMINOPHEN 5-325 MG PO TABS: 2.0000 | ORAL_TABLET | ORAL | Status: DC | PRN

## 2011-09-01 MED ORDER — IBUPROFEN 600 MG PO TABS: 600.0000 mg | ORAL_TABLET | Freq: Four times a day (QID) | ORAL | Status: DC | PRN

## 2011-09-01 MED ORDER — LIDOCAINE HCL 2 % IJ SOLN
10.0000 mL | Freq: Once | INTRAMUSCULAR | Status: AC
  Administered 2011-09-01: 200 mg

## 2011-09-01 MED ORDER — DOXYCYCLINE HYCLATE 100 MG PO CAPS: 100.0000 mg | ORAL_CAPSULE | Freq: Two times a day (BID) | ORAL | Status: DC

## 2011-09-01 MED ORDER — HYDROMORPHONE HCL PF 1 MG/ML IJ SOLN
1.0000 mg | Freq: Once | INTRAMUSCULAR | Status: AC
  Administered 2011-09-01: 1 mg via INTRAVENOUS
  Filled 2011-09-01 (×2): qty 1

## 2011-09-01 MED ORDER — HYDROMORPHONE HCL PF 1 MG/ML IJ SOLN
1.0000 mg | Freq: Once | INTRAMUSCULAR | Status: AC
  Administered 2011-09-01: 1 mg via INTRAVENOUS
  Filled 2011-09-01: qty 1

## 2011-09-01 NOTE — ED Provider Notes (Signed)
History     CSN: [id number]  Arrival date & time 09/01/11  1204   First MD Initiated Contact with Patient 09/01/11 1247      Chief Complaint  Patient presents with  . Wound Infection    (Consider location/radiation/quality/duration/timing/severity/associated sxs/prior treatment) The history is provided by the patient.   the patient is a 46 year old HIV-positive male, who complains of pain and swelling in his) for several days.  He denies nausea, vomiting, fevers, or chills.  He does not have diabetes.  Past Medical History  Diagnosis Date  . 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 Hypertension   . Seizures     last seizure was >5 years ago, pt has family history of seizures  . Syphilis 1997    history of syphilis 1997  . Chronic kidney disease     stage !! CKD, followed by Fr. Clover Mealy  . 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 Rib fractures 01/2009  . HIV infection 1980's    on ART therapy since, followed by ID clinic, complicated  by neuropathy  . 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 No pertinent past surgical history.  Family History  Problem Relation Age of Onset  . Cancer Mother   . COPD Father   . Diabetes Sister   . Hypertension Sister   . Diabetes Brother   . Hypertension Brother   . Stroke Neg Hx     History  Substance Use Topics  . Smoking status: Current Everyday Smoker -- 0.3 packs/day for 20 years    Types: Cigarettes  . Smokeless tobacco: Never Used  . Alcohol Use: No      Review of Systems  Constitutional: Negative for fever and chills.  Gastrointestinal: Negative for nausea, vomiting and abdominal pain.  Genitourinary: Negative for scrotal swelling, penile pain and testicular pain.  Skin: Positive for color change. Negative for rash.    Neurological: Negative for headaches.  All other systems reviewed and are negative.    Allergies  Review of patient's allergies indicates no known allergies.  Home Medications   Current Outpatient Rx  Name Route Sig Dispense Refill  . ALLOPURINOL 100 MG PO TABS Oral Take 1 tablet (100 mg total) by mouth daily. 30 tablet 6  . AZELASTINE HCL 0.15 % NA SOLN  2 sprays in each nostril twice daily 30 mL 6  . COLCHICINE 0.6 MG PO TABS Oral Take 1 tablet (0.6 mg total) by mouth daily. 30 tablet 0  . DICLOFENAC SODIUM 1 % TD GEL Topical Apply 1 application topically 2 (two) times daily. To knees    . HYDROCHLOROTHIAZIDE 25 MG PO TABS Oral Take 25 mg by mouth daily.    Marland Kitchen HYDROCODONE-ACETAMINOPHEN 5-500 MG PO TABS Oral Take 1 tablet by mouth every 8 (eight) hours as needed. For pain    . LAMIVUDINE-ZIDOVUDINE 150-300 MG PO TABS Oral Take 1 tablet by mouth 2 (two) times daily. 60 tablet 6  . LOPINAVIR-RITONAVIR 200-50 MG PO TABS Oral Take 2 tablets by mouth 2 (two) times daily. 120 tablet 6  . LORATADINE 10 MG PO TABS Oral Take 1 tablet (10 mg total) by mouth daily. 30 tablet 6  . PHENYTOIN SODIUM EXTENDED 100 MG PO CAPS Oral Take 400 mg by mouth daily.    Marland Kitchen  RALTEGRAVIR POTASSIUM 400 MG PO TABS Oral Take 1 tablet (400 mg total) by mouth 2 (two) times daily. 60 tablet 3  . VALSARTAN 320 MG PO TABS Oral Take 320 mg by mouth daily.      BP 176/100  Pulse 89  Temp(Src) 98.4 F (36.9 C) (Oral)  Resp 22  SpO2 97%  Physical Exam  Constitutional: He is oriented to person, place, and time. He appears well-developed and well-nourished.  HENT:  Head: Normocephalic and atraumatic.  Eyes: Pupils are equal, round, and reactive to light.  Neck: Normal range of motion.  Pulmonary/Chest: Effort normal.  Abdominal: He exhibits no distension.  Musculoskeletal: Normal range of motion.       Right inguinal area positive fluctuant abscess with approximately 6 cm diameter erythematous and tender area  surrounding the abscess  Neurological: He is alert and oriented to person, place, and time.  Skin: Skin is warm. No rash noted. There is erythema.  Psychiatric: He has a normal mood and affect. His behavior is normal. Thought content normal.    ED Course  Procedures (including critical care time) Right inguinal abscess in an HIV.  Patient.  We'll move to CDU for the PA to perform her 90.  Labs Reviewed - No data to display No results found.   No diagnosis found.    MDM   Abscess in the right inguinal area with surrounding cellulitis.  No signs of systemic toxicity to        Elmer Picker, MD 09/01/11 1325

## 2011-09-01 NOTE — ED Notes (Signed)
Pt rates pain at 6/10 at rest.  Dressing to the right groin is intact, clean and dry.  Supplies given to the patient per the PA for home care.

## 2011-09-01 NOTE — ED Provider Notes (Signed)
History     CSN: [id number]  Arrival date & time 09/01/11  1204   First MD Initiated Contact with Patient 09/01/11 1247      Chief Complaint  Patient presents with  . Wound Infection    (Consider location/radiation/quality/duration/timing/severity/associated sxs/prior treatment) Patient is a 46 y.o. male presenting with abscess. The history is provided by the patient. No language interpreter was used.  Abscess  This is a new problem. The current episode started less than one week ago. The onset was gradual. The problem occurs rarely. The problem has been gradually worsening. The abscess is present on the groin. The problem is moderate. The abscess is characterized by redness, painfulness and draining. Pertinent negatives include no fever and no vomiting. There were no sick contacts.   HIV-positive patient coming in today with a abscess in his right groin area x3 days. Abscess is draining clear. States that he has had abscesses before but never in this area and they have never had to be drained. Denies fever nausea vomiting. The area of cellulitis is 6 cm in size around the right groin. Will drain in the ER today give her IV clindamycin and send him home on doxycycline so he can afford the antibiotic.  Past Medical History  Diagnosis Date  . 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 Hypertension   . Seizures     last seizure was >5 years ago, pt has family history of seizures  . Syphilis 1997    history of syphilis 1997  . Chronic kidney disease     stage !! CKD, followed by Fr. Clover Mealy  . 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 Rib fractures 01/2009  . HIV infection 1980's    on ART therapy since, followed by ID clinic, complicated  by neuropathy  . Male circumcision 11/2005    History  reviewed. No pertinent past surgical history.  Family History  Problem Relation Age of Onset  . Cancer Mother   . COPD Father   . Diabetes Sister   . Hypertension Sister   . Diabetes Brother   . Hypertension Brother   . Stroke Neg Hx     History  Substance Use Topics  . Smoking status: Current Everyday Smoker -- 0.3 packs/day for 20 years    Types: Cigarettes  . Smokeless tobacco: Never Used  . Alcohol Use: No      Review of Systems  Constitutional: Negative for fever.  Gastrointestinal: Negative for vomiting.  All other systems reviewed and are negative.    Allergies  Review of patient's allergies indicates no known allergies.  Home Medications   Current Outpatient Rx  Name Route Sig Dispense Refill  . ALLOPURINOL 100 MG PO TABS Oral Take 1 tablet (100 mg total) by mouth daily. 30 tablet 6  . AZELASTINE HCL 0.15 % NA SOLN  2 sprays in each nostril twice daily 30 mL 6  . COLCHICINE 0.6 MG PO TABS Oral Take 1 tablet (0.6 mg total) by mouth daily. 30 tablet 0  . DICLOFENAC SODIUM 1 % TD GEL Topical Apply 1 application topically 2 (two) times daily. To knees    . HYDROCHLOROTHIAZIDE 25 MG PO TABS Oral Take 25 mg by mouth daily.    Marland Kitchen HYDROCODONE-ACETAMINOPHEN 5-500 MG PO TABS Oral Take 1  tablet by mouth every 8 (eight) hours as needed. For pain    . LAMIVUDINE-ZIDOVUDINE 150-300 MG PO TABS Oral Take 1 tablet by mouth 2 (two) times daily. 60 tablet 6  . LOPINAVIR-RITONAVIR 200-50 MG PO TABS Oral Take 2 tablets by mouth 2 (two) times daily. 120 tablet 6  . LORATADINE 10 MG PO TABS Oral Take 1 tablet (10 mg total) by mouth daily. 30 tablet 6  . PHENYTOIN SODIUM EXTENDED 100 MG PO CAPS Oral Take 400 mg by mouth daily.    Marland Kitchen RALTEGRAVIR POTASSIUM 400 MG PO TABS Oral Take 1 tablet (400 mg total) by mouth 2 (two) times daily. 60 tablet 3  . VALSARTAN 320 MG PO TABS Oral Take 320 mg by mouth daily.      BP 176/100  Pulse 89  Temp(Src) 98.4 F (36.9 C) (Oral)  Resp 22  SpO2  97%  Physical Exam  Nursing note and vitals reviewed. Constitutional: He is oriented to person, place, and time. He appears well-developed and well-nourished.  HENT:  Head: Normocephalic and atraumatic.  Eyes: Pupils are equal, round, and reactive to light.  Neck: Neck supple.  Cardiovascular: Normal rate and regular rhythm.  Exam reveals no gallop and no friction rub.   No murmur heard. Pulmonary/Chest: Breath sounds normal. No respiratory distress.  Abdominal: Soft. He exhibits no distension.  Musculoskeletal: Normal range of motion. He exhibits tenderness.       R groin  Neurological: He is alert and oriented to person, place, and time. No cranial nerve deficit.  Skin: Skin is warm and dry. Rash noted. Rash is pustular. There is erythema.     Psychiatric: He has a normal mood and affect.    ED Course  INCISION AND DRAINAGE Date/Time: 09/01/2011 12:45 PM Performed by: Sheryle Hail Authorized by: Sheryle Hail Consent: Verbal consent obtained. Written consent not obtained. Risks and benefits: risks, benefits and alternatives were discussed Consent given by: patient Patient understanding: patient states understanding of the procedure being performed Patient identity confirmed: verbally with patient, arm band, provided demographic data and hospital-assigned identification number Time out: Immediately prior to procedure a "time out" was called to verify the correct patient, procedure, equipment, support staff and site/side marked as required. Type: abscess Location: R groin. Anesthesia: local infiltration Local anesthetic: lidocaine 2% without epinephrine Anesthetic total: 8 ml Patient sedated: no Risk factor: underlying major vessel Scalpel size: 10 Needle gauge: 25. Complexity: simple Drainage: purulent and serosanguinous Drainage amount: moderate Wound treatment: wound left open Packing material: 1/4 in iodoform gauze Patient tolerance: Patient tolerated the  procedure well with no immediate complications.   (including critical care time)  Labs Reviewed - No data to display No results found.   No diagnosis found.    MDM  HIV-positive patient with the right groin abscess. Abscess was drained with purulent foul-smelling drainage copious amounts. There are 2 areas of packing the inferior greater than an inch of packing and the superior less than an inch of packing. Received 900 mg of clindamycin IV and 2 mg of Dilaudid IV while in the ER. Sent home on doxycycline and will followup in 2 days to have the packing removed here in the ER. His next appointment at the outpatient clinic is in one month. Patient was advised to return for fever nausea vomiting or increased bleeding to the site.        Sheryle Hail, NP 09/02/11 1250

## 2011-09-01 NOTE — ED Notes (Signed)
Boil on groin since Friday getting bigger and redder

## 2011-09-04 ENCOUNTER — Encounter (HOSPITAL_COMMUNITY): Payer: Self-pay | Admitting: Emergency Medicine

## 2011-09-04 ENCOUNTER — Emergency Department (HOSPITAL_COMMUNITY)
Admission: EM | Admit: 2011-09-04 | Discharge: 2011-09-04 | Discharge status: Against Medical Advice | Payer: Medicare Other | Attending: Emergency Medicine | Admitting: Emergency Medicine

## 2011-09-04 DIAGNOSIS — Z0389 Encounter for observation for other suspected diseases and conditions ruled out: Secondary | ICD-10-CM | POA: Insufficient documentation

## 2011-09-04 NOTE — ED Provider Notes (Signed)
Medical screening examination/treatment/procedure(s) were performed by non-physician practitioner and as supervising physician I was immediately available for consultation/collaboration.  Elmer Picker, MD 09/04/11 [phone]

## 2011-09-04 NOTE — ED Notes (Signed)
Pt here for wound recheck to abscess to right groin; pt sts packing is out

## 2011-09-22 ENCOUNTER — Ambulatory Visit (INDEPENDENT_AMBULATORY_CARE_PROVIDER_SITE_OTHER): Payer: Medicare Other | Admitting: Internal Medicine

## 2011-09-22 ENCOUNTER — Encounter: Payer: Self-pay | Admitting: Internal Medicine

## 2011-09-22 ENCOUNTER — Other Ambulatory Visit: Payer: Self-pay | Admitting: *Deleted

## 2011-09-22 VITALS — BP 180/101 | HR 103 | Temp 98.1°F | Ht 71.0 in | Wt 258.1 lb

## 2011-09-22 DIAGNOSIS — I10 Essential (primary) hypertension: Secondary | ICD-10-CM

## 2011-09-22 DIAGNOSIS — M109 Gout, unspecified: Secondary | ICD-10-CM

## 2011-09-22 DIAGNOSIS — N182 Chronic kidney disease, stage 2 (mild): Secondary | ICD-10-CM

## 2011-09-22 DIAGNOSIS — R000 Tachycardia, unspecified: Secondary | ICD-10-CM | POA: Diagnosis not present

## 2011-09-22 DIAGNOSIS — G40909 Epilepsy, unspecified, not intractable, without status epilepticus: Secondary | ICD-10-CM

## 2011-09-22 DIAGNOSIS — R569 Unspecified convulsions: Secondary | ICD-10-CM

## 2011-09-22 DIAGNOSIS — B20 Human immunodeficiency virus [HIV] disease: Secondary | ICD-10-CM | POA: Diagnosis not present

## 2011-09-22 DIAGNOSIS — I129 Hypertensive chronic kidney disease with stage 1 through stage 4 chronic kidney disease, or unspecified chronic kidney disease: Secondary | ICD-10-CM | POA: Diagnosis not present

## 2011-09-22 MED ORDER — ALLOPURINOL 100 MG PO TABS: 150.0000 mg | ORAL_TABLET | Freq: Every day | ORAL | Status: DC

## 2011-09-22 MED ORDER — HYDROCODONE-ACETAMINOPHEN 7.5-325 MG PO TABS: 1.0000 | ORAL_TABLET | Freq: Four times a day (QID) | ORAL | Status: AC | PRN

## 2011-09-22 MED ORDER — ATENOLOL-CHLORTHALIDONE 100-25 MG PO TABS: 1.0000 | ORAL_TABLET | Freq: Every day | ORAL | Status: DC

## 2011-09-22 NOTE — Assessment & Plan Note (Signed)
Not well-controlled. Urine acid on February 2013 showed level of 8.7. Ideally we would like his uric acid level to be below 6 for optimal control.  He is currently taking allopurinol 100 mg by mouth daily -Will titrate his allopurinol to 150 mg by mouth daily x 1 month, then recheck his uric acid level as well as his creatinine since he does have chronic kidney disease with baseline of creatinine 1.3-1.8. -Pain control with Vicodin 7.5/325 mg by mouth every 6 hour when necessary pain #90, patient also signed a pain contract today.

## 2011-09-22 NOTE — Patient Instructions (Signed)
Increase allopurinol to 150 mg one tablet daily Stop taking hydrochlorothiazide Start taking atenolol/chlorthalidone 50 mg/12.5 mg 1 tab by mouth daily As increase your pain medication: Hydrocodone 7.5/325 mg one tablet every 6 hour as needed for pain You will sign a pain contract today Get lab work today for Dilantin level Followup with Dr. Silverio Decamp in 3-4 weeks for blood pressure check as well as blood

## 2011-09-22 NOTE — Assessment & Plan Note (Addendum)
Poorly controlled in the past one year likely secondary to medication noncompliance. I will try to improve his blood pressure slowly since this has been a chronic problem. -Will add a beta blocker in addition to the diuretics to also control his heart rate: Atenolol/chlorthalidone 100/25 mg by mouth daily.  Eventually we may need to taper him off of the diuretic because it can also worsen his gout problem; but we will continue diuretic at this time given his elevated blood pressure -Continue Diovan 320 mg by mouth daily -Recheck his blood pressure in 3-4 weeks -If he is still not controlled, may need to add a calcium channel blocker

## 2011-09-22 NOTE — Assessment & Plan Note (Signed)
Stable. We'll continue current regimen

## 2011-09-22 NOTE — Assessment & Plan Note (Signed)
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 Patient has not had a seizure in years per patient report. He has been taking Dilantin 3 times weekly. We do not have a Dilantin level on file. -Will check Dilantin level today

## 2011-09-22 NOTE — Assessment & Plan Note (Signed)
Slightly tachycardic. Likely from his anxiety. He denies any chest pain, shortness of breath. -Will check a TSH

## 2011-09-22 NOTE — Assessment & Plan Note (Signed)
Last creatinine was 1.3 in October 2012 (baseline creatinine 1.3-1.8). I will repeat a BMP next office visit since we increased his allopurinol to 150 mg by mouth daily for better control of his gout

## 2011-09-22 NOTE — Progress Notes (Signed)
History of present illness:   Mr. 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   Left Knee pain: He has been having left knee pain in the past several years an x-ray of the left knee demonstrated osteoarthritis. Patient also has been followed by Dr. Eddie Dibbles with orthopedics and recently had an MRI of his left knee. However the result are not available in our electronic medical record. He states that he has been taking Vicodin 5 mg in the past 2 years and that now he has developed tolerance to that dosage, he would like to have a high dose of Vicodin. He states that without the pain medication, patient is unable to perform his daily activity and that the pain usually wake him up during the night. As for his gout, patient has been taking allopurinol 100 mg and his last uric acid level was 8.7 in 09/10/2011.  HTN: Patient has been out of his blood pressure medication in the past several days because he was out of town. His medication regimen include hydrochlorothiazide 25 mg, valsartan to 320 mg  HIV: He reports compliant with his medications without any side effects.  He is being followed by Dr. Johnnye Sima.  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 However he has not had any seizure activity in the past several years because he has cut down his alcohol. He was told to taper off his Dilantin but he continues to take it 3 times per week because he is afraid that if he stops, the seizure would come back.  Review of system: As per history of present illness  Physical examination: General: Slightly anxious, alert, well-developed, and cooperative to examination.  Lungs: normal respiratory effort, no accessory muscle use, normal breath sounds, no crackles, and no wheezes. Heart: normal  rate, regular rhythm, no murmur, no gallop, and no rub.  Abdomen: soft, non-tender, normal bowel sounds, no distention, no guarding, no rebound tenderness Extremities:  Left lower extremity:  knee joint tender to palpation along lateral aspect, no effusion or erythema or edema noted, mildly limited range of motion secondary to pain.  Right lower extremity: Within normal limit  Neurologic: Nonfocal, moving all 4 extremities  Skin: turgor normal and no rashes.  Psych: Oriented X3, memory intact for recent and remote, normally interactive, good eye contact, ++ anxious appearing, and not depressed appearing.

## 2011-10-28 ENCOUNTER — Other Ambulatory Visit: Payer: Self-pay | Admitting: Internal Medicine

## 2011-10-28 NOTE — Telephone Encounter (Signed)
Please send refill request of his HIV meds to Dr. Johnnye Sima

## 2011-10-29 ENCOUNTER — Other Ambulatory Visit: Payer: Self-pay | Admitting: *Deleted

## 2011-10-29 DIAGNOSIS — B20 Human immunodeficiency virus [HIV] disease: Secondary | ICD-10-CM

## 2011-10-29 MED ORDER — RALTEGRAVIR POTASSIUM 400 MG PO TABS: 400.0000 mg | ORAL_TABLET | Freq: Two times a day (BID) | ORAL | Status: DC

## 2012-02-03 ENCOUNTER — Ambulatory Visit (INDEPENDENT_AMBULATORY_CARE_PROVIDER_SITE_OTHER): Payer: Medicare Other | Admitting: Internal Medicine

## 2012-02-03 VITALS — BP 138/84 | HR 70 | Temp 98.0°F | Wt 247.6 lb

## 2012-02-03 DIAGNOSIS — M109 Gout, unspecified: Secondary | ICD-10-CM | POA: Diagnosis not present

## 2012-02-03 DIAGNOSIS — Z139 Encounter for screening, unspecified: Secondary | ICD-10-CM | POA: Diagnosis not present

## 2012-02-03 DIAGNOSIS — M1990 Unspecified osteoarthritis, unspecified site: Secondary | ICD-10-CM | POA: Diagnosis not present

## 2012-02-03 DIAGNOSIS — I10 Essential (primary) hypertension: Secondary | ICD-10-CM | POA: Diagnosis not present

## 2012-02-03 DIAGNOSIS — L02229 Furuncle of trunk, unspecified: Secondary | ICD-10-CM | POA: Diagnosis not present

## 2012-02-03 DIAGNOSIS — B20 Human immunodeficiency virus [HIV] disease: Secondary | ICD-10-CM | POA: Diagnosis not present

## 2012-02-03 DIAGNOSIS — L02224 Furuncle of groin: Secondary | ICD-10-CM | POA: Insufficient documentation

## 2012-02-03 DIAGNOSIS — N182 Chronic kidney disease, stage 2 (mild): Secondary | ICD-10-CM | POA: Diagnosis not present

## 2012-02-03 LAB — URIC ACID: Uric Acid, Serum: 11.5 mg/dL — ABNORMAL HIGH (ref 4.0–7.8)

## 2012-02-03 MED ORDER — DOXYCYCLINE HYCLATE 100 MG PO TABS: ORAL_TABLET | ORAL | Status: DC

## 2012-02-03 MED ORDER — HYDROCODONE-ACETAMINOPHEN 10-300 MG PO TABS: 1.0000 | ORAL_TABLET | Freq: Three times a day (TID) | ORAL | Status: DC | PRN

## 2012-02-03 MED ORDER — ATENOLOL-CHLORTHALIDONE 100-25 MG PO TABS: 1.0000 | ORAL_TABLET | Freq: Every day | ORAL | Status: DC

## 2012-02-03 NOTE — Assessment & Plan Note (Signed)
Well controlled, will continue current regimen.

## 2012-02-03 NOTE — Assessment & Plan Note (Signed)
Managed per Dr. Johnnye Sima. He reports compliance with his medications

## 2012-02-03 NOTE — Progress Notes (Signed)
HPI:  Mr. Oakley is a 46 yo man with PMH of HIV, gout, HTN, OA, seizure disorder presents today for boil and increase dosage of his pain medication. He reports taking  Vicodin 7.5mg  1.5 tabs q6hrs but not pain is not well controlled. He takes about 4 tablets daily.  He wants to increase his dosage to 10mg . He cannot take NSAIDs due to kidney failure.    Boil: right inguinal area, started since last Thursday or Friday, with purulent drainage.  Painful around the area.  He had similar boil in the past.  He denies any fever, chills, n/v.   ROS: as per HPI  PE: General: alert, well-developed, and cooperative to examination.  Lungs: normal respiratory effort, no accessory muscle use, normal breath sounds, no crackles, and no wheezes. Heart: normal rate, regular rhythm, no murmur, no gallop, and no rub.  Abdomen: soft, non-tender, normal bowel sounds, no distention, no guarding, no rebound tendernessMsk: no joint swelling, no joint warmth, and no redness over joints.  Pulses: 2+ DP/PT pulses bilaterally Extremities: No cyanosis, clubbing, edema Neurologic: nonfocal Skin: Right inguinal: 10x5cm indurated area with two 45mm lesions with pink granulation tissue.  I could not express any drainage from the site.  No fluctuant noted.  Tender to palpation.  No surrounding erythema or increased in warmth. No inguinal lymphadenopathy. Psych: flat affect

## 2012-02-03 NOTE — Assessment & Plan Note (Signed)
Right inguinal boil with induration.  NO purulent discharge noted and no fluctuant noted; therefore, I did not perform I&D nor sent culture. -Will treat with 2 wks of Doxycycline 100mg  po bid and then reassess in 2 weeks -Advise to use warm compress to affected area

## 2012-02-03 NOTE — Patient Instructions (Addendum)
Take doxycycline 100 mg 1 tablet twice daily with food for 2 weeks and then return to the office in 2 weeks for recheck You may also use warm compresses on the affected area 15-20 minutes 3 times daily Will get labs today and no call you with any abnormal results Followup in 2 weeks

## 2012-02-03 NOTE — Assessment & Plan Note (Addendum)
We increased his Allopurinol last office visit to 150mg  qd.  -Increase Vicodin to 10/300mg  q8hr prn pain #90, signed new pain contract today -Will recheck BMP to make sure his Cr is stable -Repeat Uric acid level, goal is less than 6 -Continue current regimen

## 2012-02-04 LAB — PRESCRIPTION ABUSE MONITORING 15P, URINE
Amphetamine/Meth: NEGATIVE ng/mL
Benzodiazepine Screen, Urine: NEGATIVE ng/mL
Buprenorphine, Urine: NEGATIVE ng/mL
Cannabinoid Scrn, Ur: NEGATIVE ng/mL
Carisoprodol, Urine: NEGATIVE ng/mL
Creatinine, Urine: 101.4 mg/dL (ref >20.0)
Fentanyl, Ur: NEGATIVE ng/mL
Meperidine, Ur: NEGATIVE ng/mL
Tramadol Scrn, Ur: NEGATIVE ng/mL
Zolpidem, Urine: NEGATIVE ng/mL

## 2012-02-04 LAB — BASIC METABOLIC PANEL WITH GFR
CO2: 29 mEq/L (ref 19–32)
Chloride: 100 mEq/L (ref 96–112)
Sodium: 137 mEq/L (ref 135–145)

## 2012-02-06 LAB — OPIATES/OPIOIDS (LC/MS-MS)
Hydrocodone: 824 NG/ML — ABNORMAL HIGH
Morphine Urine: NEGATIVE NG/ML
Oxycodone, ur: 128 NG/ML — ABNORMAL HIGH
Oxymorphone: 147 NG/ML — ABNORMAL HIGH

## 2012-02-09 ENCOUNTER — Telehealth: Payer: Self-pay | Admitting: *Deleted

## 2012-02-09 NOTE — Telephone Encounter (Signed)
Pt left message - unable to get pain med. ID phone recording was on - to talk with pharmacy - can they get Rx or try another pharmacy. Left instructions for pt to call clinic back - Dr Silverio Decamp is working 02/10/12 PM. Hilda Blades Bernice Mcauliffe RN 02/09/12 3PM

## 2012-02-25 ENCOUNTER — Encounter: Payer: Self-pay | Admitting: *Deleted

## 2012-03-23 ENCOUNTER — Other Ambulatory Visit: Payer: Self-pay | Admitting: Internal Medicine

## 2012-03-23 ENCOUNTER — Other Ambulatory Visit: Payer: Medicare Other

## 2012-04-05 ENCOUNTER — Ambulatory Visit (INDEPENDENT_AMBULATORY_CARE_PROVIDER_SITE_OTHER): Payer: Medicare Other | Admitting: Internal Medicine

## 2012-04-05 ENCOUNTER — Encounter: Payer: Self-pay | Admitting: Internal Medicine

## 2012-04-05 VITALS — BP 179/97 | HR 67 | Temp 98.3°F | Ht 71.0 in | Wt 267.0 lb

## 2012-04-05 DIAGNOSIS — I10 Essential (primary) hypertension: Secondary | ICD-10-CM

## 2012-04-05 DIAGNOSIS — E781 Pure hyperglyceridemia: Secondary | ICD-10-CM

## 2012-04-05 DIAGNOSIS — Z113 Encounter for screening for infections with a predominantly sexual mode of transmission: Secondary | ICD-10-CM

## 2012-04-05 DIAGNOSIS — N182 Chronic kidney disease, stage 2 (mild): Secondary | ICD-10-CM

## 2012-04-05 DIAGNOSIS — Z23 Encounter for immunization: Secondary | ICD-10-CM | POA: Diagnosis not present

## 2012-04-05 DIAGNOSIS — Z21 Asymptomatic human immunodeficiency virus [HIV] infection status: Secondary | ICD-10-CM | POA: Diagnosis not present

## 2012-04-05 DIAGNOSIS — B20 Human immunodeficiency virus [HIV] disease: Secondary | ICD-10-CM | POA: Diagnosis not present

## 2012-04-05 LAB — CBC WITH DIFFERENTIAL/PLATELET
Basophils Absolute: 0 10*3/uL (ref 0.0–0.1)
Eosinophils Absolute: 0.2 10*3/uL (ref 0.0–0.7)
Eosinophils Relative: 3 % (ref 0–5)
HCT: 41.9 % (ref 39.0–52.0)
Lymphocytes Relative: 37 % (ref 12–46)
MCH: 35 pg — ABNORMAL HIGH (ref 26.0–34.0)
MCV: 95.9 fL (ref 78.0–100.0)
Monocytes Absolute: 0.6 10*3/uL (ref 0.1–1.0)
RDW: 13.7 % (ref 11.5–15.5)
WBC: 5.9 10*3/uL (ref 4.0–10.5)

## 2012-04-05 NOTE — Progress Notes (Signed)
HIV CLINIC NOTE  RFV: re-establish care Subjective:    Patient ID: Darrell Johnson, male    DOB: 08/10/1965, 46 y.o.   MRN: [id number]  HPI 46 yo Male with HIV, currently raltegravir/kaletra/combivir. missing roughly 3 doses x per month. Has not been in care in greater than a year. He was sought out back bridge counselor to come back to clinic. He has not had labs drawn in quite some time. He denies chills, fever, nightsweats. No illness since not last being seen. He takes medications for hypertension but has not taken them this morning. He also suffers from gout. He not had any recent flares. He is not currently taking any lipid lowering agents.   Current Outpatient Prescriptions on File Prior to Visit  Medication Sig Dispense Refill  . allopurinol (ZYLOPRIM) 100 MG tablet Take 1.5 tablets (150 mg total) by mouth daily.  135 tablet  3  . atenolol-chlorthalidone (TENORETIC) 100-25 MG per tablet Take 1 tablet by mouth daily.  90 tablet  3  . Azelastine HCl 0.15 % SOLN Place 1 spray into the nose daily. 2 sprays in each nostril twice daily      . doxycycline (VIBRA-TABS) 100 MG tablet Take one tablet twice daily with food  60 tablet  0  . hydrochlorothiazide (HYDRODIURIL) 25 MG tablet Take 25 mg by mouth daily.      . Hydrocodone-Acetaminophen 10-300 MG TABS Take 1 tablet by mouth every 8 (eight) hours as needed.  90 each  0  . ibuprofen (ADVIL,MOTRIN) 600 MG tablet Take 600 mg by mouth every 6 (six) hours as needed.      . lamiVUDine-zidovudine (COMBIVIR) 150-300 MG per tablet Take 1 tablet by mouth 2 (two) times daily.      Marland Kitchen lopinavir-ritonavir (KALETRA) 200-50 MG per tablet Take 2 tablets by mouth 2 (two) times daily.      Marland Kitchen loratadine (CLARITIN) 10 MG tablet TAKE ONE (1) TABLET EACH DAY  30 tablet  6  . phenytoin (DILANTIN) 100 MG ER capsule Take 400 mg by mouth daily.      . raltegravir (ISENTRESS) 400 MG tablet Take 1 tablet (400 mg total) by mouth 2 (two) times daily.  60 tablet  6  .  valsartan (DIOVAN) 320 MG tablet Take 320 mg by mouth daily.       Active Ambulatory Problems    Diagnosis Date Noted  . HIV DISEASE 05/08/2006  . HYPERTRIGLYCERIDEMIA 11/01/2009  . Gout, unspecified 08/13/2009  . 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 ERECTILE DYSFUNCTION 06/08/2008  . PERIPHERAL NEUROPATHY 05/08/2006  . HYPERTENSION 05/08/2006  . KIDNEY DISEASE, CHRONIC, STAGE II 09/27/2006  . SEIZURE DISORDER 05/08/2006  . ALKALINE PHOSPHATASE, ELEVATED 11/01/2009  . Sinusitis 03/04/2011  . Allergic rhinitis 03/04/2011  . Tinea capitis 03/04/2011  . Tachycardia 09/22/2011  . Boil, groin 02/03/2012   Resolved Ambulatory Problems    Diagnosis Date Noted  . PULMONARY TUBERCULOSIS 05/08/2006  . CONDYLOMA ACUMINATA, PENIS 08/06/2006  . SYPHILIS NOS 05/08/2006  . ONYCHOMYCOSIS, TOENAILS 06/08/2008  . ARTHRITIS, RIGHT SHOULDER 05/08/2006  . KNEE PAIN, BILATERAL 10/06/2008  . Dysuria 10/06/2008  . FRACTURE, RIBS, MULTIPLE 01/25/2009   Past Medical History  Diagnosis Date  . Hyperlipidemia   . Hypertension   . Seizures   . Syphilis 1997  . Chronic kidney disease   . Sexually transmitted disease   . Rib fractures 01/2009  . HIV infection 1980's  . 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 Smoke only 1/2 cig at a time. A pack last 2.5 days. Drinks on weekend. Married. Not having sex with his wife. He does however, still have sex with one of his male sexual partners who he was with at the time of his HIV diagnosis. She is hiv + ; they have unprotected sex.   Review of Systems  Constitutional: Negative for fever, chills, diaphoresis, activity change, appetite change, fatigue and unexpected weight change.  HENT: Negative for congestion, sore throat, rhinorrhea, sneezing, trouble swallowing and sinus pressure.  Eyes: Negative for photophobia and visual disturbance.  Respiratory: Negative for cough, chest tightness, shortness of  breath, wheezing and stridor.  Cardiovascular: Negative for chest pain, palpitations and leg swelling.  Gastrointestinal: Negative for nausea, vomiting, abdominal pain, diarrhea, constipation, blood in stool, abdominal distention and anal bleeding.  Genitourinary: Negative for dysuria, hematuria, flank pain and difficulty urinating.  Musculoskeletal: Negative for myalgias, back pain, joint swelling, arthralgias and gait problem.  Skin: Negative for color change, pallor, rash and wound.  Neurological: Negative for dizziness, tremors, weakness and light-headedness.  Hematological: Negative for adenopathy. Does not bruise/bleed easily.  Psychiatric/Behavioral: Negative for behavioral problems, confusion, sleep disturbance, dysphoric mood, decreased concentration and agitation.       Objective:   Physical Exam BP 179/97  Pulse 67  Temp 98.3 F (36.8 C) (Oral)  Ht 5\' 11"  (1.803 m)  Wt 267 lb (121.11 kg)  BMI 37.24 kg/m2 Physical Exam  Constitutional: He is oriented to person, place, and time. He appears well-developed and well-nourished. No distress.  HENT:  Mouth/Throat: Oropharynx is clear and moist. No oropharyngeal exudate.  Cardiovascular: Normal rate, regular rhythm and normal heart sounds. Exam reveals no gallop and no friction rub.  No murmur heard.  Pulmonary/Chest: Effort normal and breath sounds normal. No respiratory distress. He has no wheezes.  Abdominal: Soft. Bowel sounds are normal. He exhibits no distension. There is no tenderness.  Lymphadenopathy:  He has no cervical adenopathy.  Neurological: He is alert and oriented to person, place, and time.  Skin: Skin is warm and dry. No rash noted. No erythema.  Psychiatric: He has a normal mood and affect. His behavior is normal.      Assessment & Plan:   HIV= will continue on his current regimen, for now Check labs first and see if need to get genotype.would like to aim to change to a different regimen other than  kaletra/combivir  BP = didn't take meds this am. Difficult to tell how poorly controlled his BP is if he is not taking his medications regularly. Will monitor at next visit to decide if need to change his regimen.  Hyperlipidemia = will check lipids in order to decide if needs addn medications.  Health maintenacne = gave flu shot  rtc in 6 wks.

## 2012-04-06 ENCOUNTER — Encounter: Payer: Self-pay | Admitting: Internal Medicine

## 2012-04-06 ENCOUNTER — Ambulatory Visit (INDEPENDENT_AMBULATORY_CARE_PROVIDER_SITE_OTHER): Payer: Medicare Other | Admitting: Internal Medicine

## 2012-04-06 VITALS — BP 148/95 | HR 69 | Temp 97.6°F | Ht 70.5 in | Wt 241.2 lb

## 2012-04-06 DIAGNOSIS — Z23 Encounter for immunization: Secondary | ICD-10-CM | POA: Diagnosis not present

## 2012-04-06 DIAGNOSIS — M1990 Unspecified osteoarthritis, unspecified site: Secondary | ICD-10-CM | POA: Diagnosis not present

## 2012-04-06 DIAGNOSIS — I10 Essential (primary) hypertension: Secondary | ICD-10-CM | POA: Diagnosis not present

## 2012-04-06 LAB — COMPREHENSIVE METABOLIC PANEL
AST: 22 U/L (ref 0–37)
BUN: 44 mg/dL — ABNORMAL HIGH (ref 6–23)
CO2: 30 mEq/L (ref 19–32)
Calcium: 9.7 mg/dL (ref 8.4–10.5)
Chloride: 100 mEq/L (ref 96–112)
Creat: 2.17 mg/dL — ABNORMAL HIGH (ref 0.50–1.35)

## 2012-04-06 LAB — LIPID PANEL
Cholesterol: 164 mg/dL (ref 0–200)
HDL: 34 mg/dL — ABNORMAL LOW (ref >39)
Total CHOL/HDL Ratio: 4.8 Ratio

## 2012-04-06 LAB — URINALYSIS, MICROSCOPIC ONLY
Crystals: NONE SEEN
Squamous Epithelial / LPF: NONE SEEN

## 2012-04-06 LAB — URINALYSIS, ROUTINE W REFLEX MICROSCOPIC
Bilirubin Urine: NEGATIVE
Glucose, UA: NEGATIVE mg/dL
Hgb urine dipstick: NEGATIVE
Ketones, ur: NEGATIVE mg/dL
pH: 7.5 (ref 5.0–8.0)

## 2012-04-06 MED ORDER — CARVEDILOL 12.5 MG PO TABS: 12.5000 mg | ORAL_TABLET | Freq: Two times a day (BID) | ORAL | Status: DC

## 2012-04-06 MED ORDER — AMLODIPINE BESYLATE 10 MG PO TABS: 10.0000 mg | ORAL_TABLET | Freq: Every day | ORAL | Status: DC

## 2012-04-06 MED ORDER — HYDROCODONE-ACETAMINOPHEN 10-300 MG PO TABS: 1.0000 | ORAL_TABLET | Freq: Three times a day (TID) | ORAL | Status: DC | PRN

## 2012-04-06 NOTE — Patient Instructions (Addendum)
Stop taking hydrochlorothiazide Stop Diovan and atenolol/chlorthalidone Start taking Norvasc 10mg  one tablet daily Start taking Coreg 12.5mg  one tablet twice daily Follow up in 2 weeks for BP check and blood work

## 2012-04-06 NOTE — Progress Notes (Signed)
HPI: Darrell Johnson is a 46 yo M with PMH of HIV, HTN, gout presents today for BP check. He was seen in ID clinic yesterday and his SBP was in 170's.  However, he wasn't taking his medication at that time and also he has been irritated because people are getting on his nerve.  He reports taking hctz and atenolol/chlorthalidone and diovan.  No other complaints today except for bumping into something that scratched his lower left leg.  ROS; as per HPI  PE: General: alert, well-developed, and cooperative to examination.   Lungs: normal respiratory effort, no accessory muscle use, normal breath sounds, no crackles, and no wheezes. Heart: normal rate, regular rhythm, no murmur, no gallop, and no rub.  Abdomen: soft, non-tender, normal bowel sounds, no distention, no guarding, no rebound tenderness Msk: no joint swelling, no joint warmth, and no redness over joints.  Pulses: 2+ DP/PT pulses bilaterally Extremities: No cyanosis, clubbing, edema Neurologic: alert & oriented X3, cranial nerves II-XII intact, strength normal in all extremities, sensation intact to light touch, and gait normal.  Skin: turgor normal and +1cm laceration with old blood on LLE on epidermis  Psych: Oriented X3, memory intact for recent and remote, normally interactive, good eye contact, + anxious appearing, and not depressed appearing.

## 2012-04-06 NOTE — Assessment & Plan Note (Addendum)
Still not at goal. Repeat BP was 148/95.  I reviewed his labs from yesterday and his Cr has been trending up since 2012 from 1.38 to 2.17 (with baseline Cr~1.4-1.5).  BUN/Cr ratio is 20.2 however, he has been on diuretics.  He is actually telling me that he takes hydrochlorothiazide as well as atenolol/chlorthalidone 100/25mg  and Diovan.  He maybe volume contracted in addition to ATN 2/2 to ARB. 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

## 2012-04-07 NOTE — Addendum Note (Signed)
Addended by: Sander Nephew F on: 04/07/2012 08:32 AM   Modules accepted: Orders

## 2012-05-13 ENCOUNTER — Encounter: Payer: Self-pay | Admitting: Internal Medicine

## 2012-05-13 ENCOUNTER — Ambulatory Visit (INDEPENDENT_AMBULATORY_CARE_PROVIDER_SITE_OTHER): Payer: Medicare Other | Admitting: Internal Medicine

## 2012-05-13 VITALS — BP 142/88 | HR 76 | Temp 98.0°F | Wt 240.8 lb

## 2012-05-13 DIAGNOSIS — M1990 Unspecified osteoarthritis, unspecified site: Secondary | ICD-10-CM | POA: Diagnosis not present

## 2012-05-13 DIAGNOSIS — J329 Chronic sinusitis, unspecified: Secondary | ICD-10-CM | POA: Diagnosis not present

## 2012-05-13 DIAGNOSIS — N182 Chronic kidney disease, stage 2 (mild): Secondary | ICD-10-CM

## 2012-05-13 DIAGNOSIS — I10 Essential (primary) hypertension: Secondary | ICD-10-CM | POA: Diagnosis not present

## 2012-05-13 MED ORDER — AMOXICILLIN-POT CLAVULANATE 875-125 MG PO TABS: 1.0000 | ORAL_TABLET | Freq: Two times a day (BID) | ORAL | Status: AC

## 2012-05-13 MED ORDER — FLUTICASONE PROPIONATE 50 MCG/ACT NA SUSP: 1.0000 | Freq: Every day | NASAL | Status: DC

## 2012-05-13 MED ORDER — CETIRIZINE HCL 10 MG PO TABS: 10.0000 mg | ORAL_TABLET | Freq: Every day | ORAL | Status: DC

## 2012-05-13 MED ORDER — PSEUDOEPHEDRINE HCL 30 MG PO TABS: 30.0000 mg | ORAL_TABLET | Freq: Four times a day (QID) | ORAL | Status: DC | PRN

## 2012-05-13 MED ORDER — HYDROCODONE-ACETAMINOPHEN 10-300 MG PO TABS: 1.0000 | ORAL_TABLET | Freq: Three times a day (TID) | ORAL | Status: DC | PRN

## 2012-05-13 NOTE — Assessment & Plan Note (Signed)
Acute sinusitis- likely viral but with Bacterial superimposed infection now with purulent nasal discharge x 3 weeks in duration. He also has retro-orbital pain.   -Will treat with  augmentin 875mg  po bid x 2 weeks -Flonase nasal spray -Zyrtec 10mg  qd -Sudafed PRN for congestion

## 2012-05-13 NOTE — Assessment & Plan Note (Signed)
Baseline Cr ~1.6, recent Cr in 03/2012 was 2.17, will repeat BMP today.

## 2012-05-13 NOTE — Progress Notes (Signed)
HPI: Mr. Mcclees is a 46 yo M with HIV, CKD stage II (baseline Cr 1.6 but recently Cr 2.17 in Sept 2013), HLP, HTN, gout presents today for follow up.   Nasal drainage x 3 weeks but does not know the color, no fever or chills, +headache, no cough or ear pain.  +sneezing, eye redness but no itching.  There is some pain behind his eyes along with congestion. Reports compliance with his meds. He has appt with [name] next week because she is going to change some of his meds.     ROS: as per HPI  PE: General: alert, well-developed, and cooperative to examination.  HEENT: purulent nasal discharge, edematous nasal mucosa and erythematous. TM clear bilaterally.  Oropharynx pink and moist. Tenderness to palpation near orbital area/nose, no frontal/maxillary tenderness.   Lungs: normal respiratory effort, no accessory muscle use, normal breath sounds, no crackles, and no wheezes. Heart: normal rate, regular rhythm, no murmur, no gallop, and no rub.  Abdomen: soft, non-tender, normal bowel sounds, no distention, no guarding, no rebound tenderness Neurologic: nonfocal

## 2012-05-13 NOTE — Assessment & Plan Note (Signed)
Slightly better controlled than last office visit. Repeat BP was 142/88.  He is currently on norvasc 10mg  qd and coreg 12.5mg  bid.  We have been holding his Diovan because of elevated in Cr. -Will repeat BMP today and may resume his ARB for kidney protection -Continue current regimen at this time

## 2012-05-13 NOTE — Patient Instructions (Addendum)
Get labs today and will call you with abnormal results Start taking Zyrtec 10mg  one tablet daily Start taking Flonase 2 sprays in each nostril at bedtime Start taking Augmentin one tablet twice daily x 2 weeks Follow up with [name] next week on 10/31 Follow up with Dr. Silverio Decamp in 3 months

## 2012-05-13 NOTE — Addendum Note (Signed)
Addended by: Julius Bowels T on: 05/13/2012 05:04 PM   Modules accepted: Orders, Medications

## 2012-05-14 LAB — BASIC METABOLIC PANEL WITH GFR
BUN: 26 mg/dL — ABNORMAL HIGH (ref 6–23)
Calcium: 9 mg/dL (ref 8.4–10.5)
Chloride: 104 mEq/L (ref 96–112)
Creat: 2 mg/dL — ABNORMAL HIGH (ref 0.50–1.35)
GFR, Est African American: 45 mL/min — ABNORMAL LOW
Glucose, Bld: 80 mg/dL (ref 70–99)

## 2012-05-20 ENCOUNTER — Encounter: Payer: Self-pay | Admitting: Internal Medicine

## 2012-05-20 ENCOUNTER — Ambulatory Visit (INDEPENDENT_AMBULATORY_CARE_PROVIDER_SITE_OTHER): Payer: Medicare Other | Admitting: Internal Medicine

## 2012-05-20 VITALS — BP 131/75 | HR 78 | Temp 98.1°F | Wt 247.0 lb

## 2012-05-20 DIAGNOSIS — E785 Hyperlipidemia, unspecified: Secondary | ICD-10-CM

## 2012-05-20 MED ORDER — PRAVASTATIN SODIUM 40 MG PO TABS: 40.0000 mg | ORAL_TABLET | Freq: Every day | ORAL | Status: DC

## 2012-05-20 NOTE — Progress Notes (Signed)
HIV CLINIC VISIT  RFV: routine follow up Subjective:    Patient ID: Darrell Johnson, male    DOB: October 03, 1965, 46 y.o.   MRN: [id number]  HPI  46yo Male with HIV, CD 4 count of 670(31%)/VL < 20, currently onRaltegravir/kaletra/combivir, once in a while misses evening dose of raltegravir. Otherwise, he is doing well. Denies any problems, other than occ. Tooth ache.  Current Outpatient Prescriptions on File Prior to Visit  Medication Sig Dispense Refill  . allopurinol (ZYLOPRIM) 100 MG tablet Take 1.5 tablets (150 mg total) by mouth daily.  135 tablet  3  . amLODipine (NORVASC) 10 MG tablet Take 1 tablet (10 mg total) by mouth daily.  30 tablet  11  . amoxicillin-clavulanate (AUGMENTIN) 875-125 MG per tablet Take 1 tablet by mouth every 12 (twelve) hours.  28 tablet  0  . carvedilol (COREG) 12.5 MG tablet Take 1 tablet (12.5 mg total) by mouth 2 (two) times daily.  60 tablet  11  . cetirizine (ZYRTEC) 10 MG tablet Take 1 tablet (10 mg total) by mouth daily.  30 tablet  3  . fluticasone (FLONASE) 50 MCG/ACT nasal spray Place 1 spray into the nose daily.  16 g  2  . Hydrocodone-Acetaminophen 10-300 MG TABS Take 1 tablet by mouth every 8 (eight) hours as needed.  90 each  5  . lamiVUDine-zidovudine (COMBIVIR) 150-300 MG per tablet Take 1 tablet by mouth 2 (two) times daily.      Marland Kitchen lopinavir-ritonavir (KALETRA) 200-50 MG per tablet Take 2 tablets by mouth 2 (two) times daily.      . pseudoephedrine (SUDAFED) 30 MG tablet Take 1 tablet (30 mg total) by mouth every 6 (six) hours as needed for congestion.  24 tablet  0  . raltegravir (ISENTRESS) 400 MG tablet Take 1 tablet (400 mg total) by mouth 2 (two) times daily.  60 tablet  6     Review of Systems 10 point ROS is negative    Objective:   Physical Exam BP 131/75  Pulse 78  Temp 98.1 F (36.7 C) (Oral)  Wt 247 lb (112.038 kg) Physical Exam  Constitutional: He is oriented to person, place, and time. He appears well-developed and  well-nourished. No distress.  HENT:  Mouth/Throat: Oropharynx is clear and moist. No oropharyngeal exudate. Poor dentition, fracture right upper molar Cardiovascular: Normal rate, regular rhythm and normal heart sounds. Exam reveals no gallop and no friction rub.  No murmur heard.  Pulmonary/Chest: Effort normal and breath sounds normal. No respiratory distress. He has no wheezes.  Abdominal: Soft. Bowel sounds are normal. He exhibits no distension. There is no tenderness.  Lymphadenopathy:  He has no cervical adenopathy.  Neurological: He is alert and oriented to person, place, and time.  Skin: Skin is warm and dry. No rash noted. No erythema.  Psychiatric: He has a normal mood and affect. His behavior is normal.          Assessment & Plan:  HIV = continue with his current regimen, will repeat his labs in 3 monts  hypercholesterol/hypertriglyceridemia = will start with pravachol; repeat lipids in 3 months and likely will need to add fenofibrates.  Tooth ache = dental clinic referral  Health maintenance= to get flu vaccine today  rtc in 3 months

## 2012-05-28 ENCOUNTER — Other Ambulatory Visit: Payer: Self-pay | Admitting: Internal Medicine

## 2012-05-28 DIAGNOSIS — M1990 Unspecified osteoarthritis, unspecified site: Secondary | ICD-10-CM

## 2012-05-28 MED ORDER — HYDROCODONE-ACETAMINOPHEN 10-325 MG PO TABS: 1.0000 | ORAL_TABLET | Freq: Four times a day (QID) | ORAL | Status: DC | PRN

## 2012-05-28 NOTE — Progress Notes (Signed)
Rx called in 

## 2012-08-11 ENCOUNTER — Encounter: Payer: Self-pay | Admitting: Internal Medicine

## 2012-08-18 ENCOUNTER — Other Ambulatory Visit: Payer: Medicare Other

## 2012-08-18 ENCOUNTER — Other Ambulatory Visit: Payer: Self-pay | Admitting: Orthopedic Surgery

## 2012-08-18 DIAGNOSIS — R609 Edema, unspecified: Secondary | ICD-10-CM

## 2012-08-18 DIAGNOSIS — M25569 Pain in unspecified knee: Secondary | ICD-10-CM | POA: Diagnosis not present

## 2012-08-19 ENCOUNTER — Ambulatory Visit
Admission: RE | Admit: 2012-08-19 | Discharge: 2012-08-19 | Disposition: A | Discharge status: Home / Self Care | Payer: Medicare Other | Source: Ambulatory Visit | Attending: Orthopedic Surgery | Admitting: Orthopedic Surgery

## 2012-08-19 DIAGNOSIS — M79609 Pain in unspecified limb: Secondary | ICD-10-CM | POA: Diagnosis not present

## 2012-08-19 DIAGNOSIS — R609 Edema, unspecified: Secondary | ICD-10-CM

## 2012-08-19 DIAGNOSIS — M7989 Other specified soft tissue disorders: Secondary | ICD-10-CM | POA: Diagnosis not present

## 2012-08-27 ENCOUNTER — Encounter: Payer: Self-pay | Admitting: Internal Medicine

## 2012-08-27 ENCOUNTER — Ambulatory Visit (INDEPENDENT_AMBULATORY_CARE_PROVIDER_SITE_OTHER): Payer: Medicare Other | Admitting: Internal Medicine

## 2012-08-27 VITALS — BP 138/81 | HR 80 | Temp 97.8°F | Ht 71.0 in | Wt 258.0 lb

## 2012-08-27 DIAGNOSIS — M109 Gout, unspecified: Secondary | ICD-10-CM

## 2012-08-27 DIAGNOSIS — M1990 Unspecified osteoarthritis, unspecified site: Secondary | ICD-10-CM | POA: Diagnosis not present

## 2012-08-27 DIAGNOSIS — I10 Essential (primary) hypertension: Secondary | ICD-10-CM

## 2012-08-27 DIAGNOSIS — E785 Hyperlipidemia, unspecified: Secondary | ICD-10-CM | POA: Diagnosis not present

## 2012-08-27 DIAGNOSIS — M7989 Other specified soft tissue disorders: Secondary | ICD-10-CM

## 2012-08-27 DIAGNOSIS — E781 Pure hyperglyceridemia: Secondary | ICD-10-CM | POA: Diagnosis not present

## 2012-08-27 DIAGNOSIS — N182 Chronic kidney disease, stage 2 (mild): Secondary | ICD-10-CM | POA: Diagnosis not present

## 2012-08-27 LAB — COMPLETE METABOLIC PANEL WITH GFR
Alkaline Phosphatase: 82 U/L (ref 39–117)
BUN: 36 mg/dL — ABNORMAL HIGH (ref 6–23)
CO2: 24 mEq/L (ref 19–32)
Creat: 2.3 mg/dL — ABNORMAL HIGH (ref 0.50–1.35)
GFR, Est African American: 38 mL/min — ABNORMAL LOW
GFR, Est Non African American: 33 mL/min — ABNORMAL LOW
Glucose, Bld: 157 mg/dL — ABNORMAL HIGH (ref 70–99)
Total Bilirubin: 0.4 mg/dL (ref 0.3–1.2)
Total Protein: 6.7 g/dL (ref 6.0–8.3)

## 2012-08-27 LAB — LIPID PANEL
Cholesterol: 229 mg/dL — ABNORMAL HIGH (ref 0–200)
LDL Cholesterol: 132 mg/dL — ABNORMAL HIGH (ref 0–99)
Triglycerides: 289 mg/dL — ABNORMAL HIGH (ref <150)
VLDL: 58 mg/dL — ABNORMAL HIGH (ref 0–40)

## 2012-08-27 LAB — URIC ACID: Uric Acid, Serum: 12.6 mg/dL — ABNORMAL HIGH (ref 4.0–7.8)

## 2012-08-27 MED ORDER — FLUTICASONE PROPIONATE 50 MCG/ACT NA SUSP: 1.0000 | Freq: Every day | NASAL | Status: DC

## 2012-08-27 MED ORDER — LISINOPRIL 10 MG PO TABS: 10.0000 mg | ORAL_TABLET | Freq: Every day | ORAL | Status: DC

## 2012-08-27 MED ORDER — PRAVASTATIN SODIUM 40 MG PO TABS: 40.0000 mg | ORAL_TABLET | Freq: Every day | ORAL | Status: DC

## 2012-08-27 MED ORDER — CETIRIZINE HCL 10 MG PO TABS: 10.0000 mg | ORAL_TABLET | Freq: Every day | ORAL | Status: DC

## 2012-08-27 MED ORDER — LISINOPRIL 5 MG PO TABS: 5.0000 mg | ORAL_TABLET | Freq: Every day | ORAL | Status: DC

## 2012-08-27 MED ORDER — HYDROCODONE-ACETAMINOPHEN 10-300 MG PO TABS: 1.0000 | ORAL_TABLET | Freq: Three times a day (TID) | ORAL | Status: DC | PRN

## 2012-08-27 NOTE — Assessment & Plan Note (Signed)
Cr is 2 in 04/2012. His baseline ~ Cr 1.6.   -Will repeat CMP today

## 2012-08-27 NOTE — Assessment & Plan Note (Signed)
Uric acid level is not less than 6 which is our goal. He is currently taking allopurinol 150 daily. -Will continue Vicodin for pain -Repeat urine acid level

## 2012-08-27 NOTE — Assessment & Plan Note (Addendum)
Legs edema is bilateral with +2 pitting edema up to knees.  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 He states that he had leg Dopplers performed which were negative for DVT. He denies any shortness of breath or chest pain. -Will get 2-D echocardiogram to evaluate heart function -Leg elevation above heart level -Stop Norvasc -Followup with me in 2-3 weeks -Will also check albumin level, CMP, proBNP -Consider Lasix if he does have CHF

## 2012-08-27 NOTE — Patient Instructions (Addendum)
Stop taking Norvasc Start taking Lisinopril 10mg  one tablet daily Will schedule for 2D echocardiogram (heart ultrasound) Elevate your legs above heart level 45 minutes 2-3 times daily Get labs today and I will call you with abnormal results Follow up with Dr. Silverio Decamp in 2-3 weeks

## 2012-08-27 NOTE — Progress Notes (Signed)
Patient ID: Darrell Johnson, male   DOB: 1966/04/09, 47 y.o.   MRN: [id number] HPI: [name] is a 47 yo M with HIV with CD4 670 and VL <20 in 03/2012 follows by [name], CKD stage II (baseline Cr 1.6 but Cr 2.0 in Oct 2013), HLP, HTN, gout presents today for follow up.  Saw Dr. Eddie Dibbles last week and had left knee injection for his swelling and was given some pills to help wit the swelling as well.  Doppler was done did not show blood clot per patient's report. Bilateral leg swelling in past 1 week as well.  He elevates his legs at night with some relief.  He denies any shortness of breath or chest pain. Never had this leg swelling in the past. He stopped taking Pravastatin about 1 month ago because he was told by pharmacist that it might counteract the effect of his HIV med. He also stopped taking Hydrocodone as well because the pharmacist told him that it might cause heart attack. He is compliant with HIV meds most of the time and missed about 1 dose.   ROS: as per HPI  PE: General: alert, well-developed, and cooperative to examination.  Lungs: normal respiratory effort, no accessory muscle use, normal breath sounds, no crackles, and no wheezes. Heart: normal rate, regular rhythm, no murmur, no gallop, and no rub.  Abdomen: soft, non-tender, normal bowel sounds, no distention, no guarding, no rebound tenderness Neurologic: nonfocal Skin: turgor normal . EXT: +2 pitting edema lower extremity bilaterally up to knee level. No erythema or increased in warmth noted. Left knee unremarkable, no effusion noted  Psych: anxious

## 2012-08-27 NOTE — Assessment & Plan Note (Signed)
Need to resume statin given his triglyceride >600's. -Repeat lipid panel today -Follow up in 2 weeks

## 2012-08-27 NOTE — Assessment & Plan Note (Addendum)
  BP Readings from Last 3 Encounters:  08/27/12 138/81  05/20/12 131/75  05/13/12 142/88    Lab Results  Component Value Date   NA 138 05/13/2012   K 3.9 05/13/2012   CREATININE 2.00* 05/13/2012    Assessment:   Blood pressure control:  well-controlled  Progress toward BP goal:   yes  Plan:  Medications:  Will stop Norvasc 10 mg daily given his leg edema which could be a side effect of norvasc.  -Continue Coreg 12.5 mg twice a day.   -Need to resume ACEi for renal protection: Lisinopril 10mg   -Repeat CMP    Educational resources provided:  yes  Self management tools provided:  Yes

## 2012-08-31 ENCOUNTER — Other Ambulatory Visit: Payer: Self-pay | Admitting: Internal Medicine

## 2012-08-31 DIAGNOSIS — N184 Chronic kidney disease, stage 4 (severe): Secondary | ICD-10-CM

## 2012-09-01 ENCOUNTER — Other Ambulatory Visit: Payer: Self-pay | Admitting: Infectious Diseases

## 2012-09-01 DIAGNOSIS — B20 Human immunodeficiency virus [HIV] disease: Secondary | ICD-10-CM

## 2012-09-06 ENCOUNTER — Other Ambulatory Visit: Payer: Medicare Other

## 2012-09-07 ENCOUNTER — Ambulatory Visit (HOSPITAL_COMMUNITY)
Admission: RE | Admit: 2012-09-07 | Discharge: 2012-09-07 | Disposition: A | Discharge status: Home / Self Care | Payer: Medicare Other | Source: Ambulatory Visit | Attending: Internal Medicine | Admitting: Internal Medicine

## 2012-09-07 DIAGNOSIS — M7989 Other specified soft tissue disorders: Secondary | ICD-10-CM | POA: Diagnosis not present

## 2012-09-07 DIAGNOSIS — I059 Rheumatic mitral valve disease, unspecified: Secondary | ICD-10-CM | POA: Insufficient documentation

## 2012-09-07 DIAGNOSIS — Z21 Asymptomatic human immunodeficiency virus [HIV] infection status: Secondary | ICD-10-CM | POA: Insufficient documentation

## 2012-09-07 DIAGNOSIS — I10 Essential (primary) hypertension: Secondary | ICD-10-CM | POA: Diagnosis not present

## 2012-09-07 NOTE — Progress Notes (Signed)
  Echocardiogram 2D Echocardiogram has been performed.  Ardelle Balls A 09/07/2012, 3:50 PM

## 2012-09-17 ENCOUNTER — Ambulatory Visit (INDEPENDENT_AMBULATORY_CARE_PROVIDER_SITE_OTHER): Payer: Medicare Other | Admitting: Internal Medicine

## 2012-09-17 VITALS — BP 147/86 | HR 70 | Temp 98.2°F | Ht 71.0 in | Wt 255.8 lb

## 2012-09-17 DIAGNOSIS — M7989 Other specified soft tissue disorders: Secondary | ICD-10-CM | POA: Diagnosis not present

## 2012-09-17 DIAGNOSIS — M109 Gout, unspecified: Secondary | ICD-10-CM

## 2012-09-17 DIAGNOSIS — N183 Chronic kidney disease, stage 3 unspecified: Secondary | ICD-10-CM | POA: Diagnosis not present

## 2012-09-17 DIAGNOSIS — I10 Essential (primary) hypertension: Secondary | ICD-10-CM

## 2012-09-17 LAB — BASIC METABOLIC PANEL WITH GFR
Calcium: 9 mg/dL (ref 8.4–10.5)
Creat: 2.08 mg/dL — ABNORMAL HIGH (ref 0.50–1.35)
GFR, Est African American: 43 mL/min — ABNORMAL LOW
Potassium: 4.3 mEq/L (ref 3.5–5.3)
Sodium: 140 mEq/L (ref 135–145)

## 2012-09-17 MED ORDER — FUROSEMIDE 20 MG PO TABS: 20.0000 mg | ORAL_TABLET | Freq: Every day | ORAL | Status: DC

## 2012-09-17 MED ORDER — PREDNISONE 10 MG PO TABS: ORAL_TABLET | ORAL | Status: DC

## 2012-09-17 NOTE — Patient Instructions (Addendum)
Start Lasix 20 mg daily Take prednisone as prescribed We'll get labs today and I will call you with any abnormal results You need to return in 2 weeks for lab work [facility] refer you to rheumatology to help  With your gout Followup with Dr. Silverio Decamp in 3 weeks

## 2012-09-17 NOTE — Assessment & Plan Note (Signed)
BP Readings from Last 3 Encounters:  09/17/12 147/86  08/27/12 138/81  05/20/12 131/75    Lab Results  Component Value Date   NA 141 08/27/2012   K 4.5 08/27/2012   CREATININE 2.30* 08/27/2012    Assessment:  Blood pressure control:   not well-controlled  Progress toward BP goal:    no    Plan:  Medications:  Continue lisinopril 10 mg daily, Coreg 12.5 mg twice a day. Will start Lasix 20 mg daily  Educational resources provided:   yes  Self management tools provided:   yes  Other plans: Check BMP today and repeat BMP in 2 weeks

## 2012-09-17 NOTE — Progress Notes (Addendum)
Patient ID: KASTEN UEHLING, male   DOB: Jan 20, 1966, 47 y.o.   MRN: [id number] HPI: Mr. Kammer is a 47 yo man with HIV CD 670 in 03/2012, HTN, hypertriglyceridemia, seizure disorder, gout, CKD stage III (GFR 38) presents today for follow up. He states that since we stopped the Norvasc, his swelling in both legs has decreased for about one week but then it started back again. He states that Dr. Eddie Dibbles gave him a steroid injection in his right knee about a month ago but now he's starting to feel redness along his medial knee but the pain is more posterior knee. This left knee pain is similar to that when he first saw Dr. Eddie Dibbles. He states that he is limping and that it hurts when he bear weight on that right leg. He denies any fevers or chills nausea vomiting, shortness of breath or chest pain. His weight has been steadily increased. As for his CKD, patient states he saw Dr. Moshe Cipro in the past but has not seen her recently. He tells me that he does not want dialysis.  ROS: as per HPI  PE: General: alert, well-developed, and cooperative to examination.  Lungs: normal respiratory effort, no accessory muscle use, normal breath sounds, no crackles, and no wheezes. Heart: normal rate, regular rhythm, no murmur, no gallop, and no rub.  Abdomen: soft, non-tender, normal bowel sounds, no distention, no guarding, no rebound tenderness Neurologic: nonfocal EXT: Left knee: erythema on the medial aspect measuring about 5-6 cm, tender to palpation, limited range of motion of the right knee due to pain. No effusion noted. He does have +2 pitting edema bilateral lower extremity  Psych: appropriate

## 2012-09-17 NOTE — Assessment & Plan Note (Signed)
Patient does have bilateral leg swelling. Differential diagnosis includes venous insufficiency versus CHF versus medication induced. His 2-D echocardiogram showed 50-60% EF but did not mention any diastolic heart failure. His albumin was low normal. He denies any shortness of breath. He states that after he stopped Norvasc, his leg swelling did improve however came back after one week. Given that he also has increase in weight will try diuretics -Will try Lasix 20 mg daily since he has CKD -Continue leg elevation -Recommend compression stockings -Repeat BMP today and also another BMP in 2 weeks

## 2012-09-17 NOTE — Assessment & Plan Note (Signed)
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 Patient has seen Dr. Moshe Cipro with nephrology in the past but has not seen her recently. -Highly recommend patient return and see Dr. Moshe Cipro to discuss future plans. Although he states that he does not want dialysis. It is unclear if he is a candidate for kidney transplant given his HIV status -Will continue to monitor his BMP. -Continue ACE inhibitor

## 2012-09-17 NOTE — Assessment & Plan Note (Signed)
Clinical finding is most likely consistent with gout flare. His uric acid level on 08/27/2012 was 12 which is elevated. He states he normally have gout flare in the lower extremity including his knees as well as ankle. He has received steroid injection in his right knee about a month ago. I did not think that this is a septic joint give pain is not in his joints and that it is more posterior and that he does not have fever or chills or any other systemic symptoms to indicate an acute infection. Was not able to appreciate any effusion therefore we chose not to perform arthrocentesis. Dr. Software engineer examined patient with me. -Will prescribe prednisone tapering course x10 days -Currently he is on allopurinol 150 mg daily given his CKD but the urinalysis is not well-controlled, should be less than 6. Will refer patient to rheumatology for help with management

## 2012-09-20 ENCOUNTER — Ambulatory Visit: Payer: Medicare Other | Admitting: Internal Medicine

## 2012-09-30 ENCOUNTER — Ambulatory Visit (INDEPENDENT_AMBULATORY_CARE_PROVIDER_SITE_OTHER): Payer: Medicare Other | Admitting: Internal Medicine

## 2012-09-30 ENCOUNTER — Encounter: Payer: Self-pay | Admitting: Internal Medicine

## 2012-09-30 VITALS — BP 166/98 | HR 73 | Temp 98.2°F | Wt 246.8 lb

## 2012-09-30 DIAGNOSIS — B20 Human immunodeficiency virus [HIV] disease: Secondary | ICD-10-CM

## 2012-09-30 LAB — CBC
Hemoglobin: 15.5 g/dL (ref 13.0–17.0)
MCHC: 35.8 g/dL (ref 30.0–36.0)
Platelets: 225 10*3/uL (ref 150–400)
RBC: 4.52 MIL/uL (ref 4.22–5.81)

## 2012-09-30 LAB — BASIC METABOLIC PANEL WITH GFR
BUN: 29 mg/dL — ABNORMAL HIGH (ref 6–23)
Calcium: 9.2 mg/dL (ref 8.4–10.5)
GFR, Est African American: 41 mL/min — ABNORMAL LOW
GFR, Est Non African American: 36 mL/min — ABNORMAL LOW
Potassium: 4.3 mEq/L (ref 3.5–5.3)
Sodium: 139 mEq/L (ref 135–145)

## 2012-09-30 NOTE — Progress Notes (Signed)
6 month follow up Subjective:    Patient ID: Darrell Johnson, male    DOB: [birth year], 47 y.o.   MRN: [id number]  HPI 47yo Male with HI, Cd 4 count of 670(31%)/VL < 20, on raltegravir,kaletra/combivir.   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 Treated with steroids. Doing better. Was referred to [place] [facility] for eval of HTN and kidney function  Had an episode of sinus congestion in early feb given rx for flonase!    Current Outpatient Prescriptions on File Prior to Visit  Medication Sig Dispense Refill  . allopurinol (ZYLOPRIM) 100 MG tablet Take 1.5 tablets (150 mg total) by mouth daily.  135 tablet  3  . carvedilol (COREG) 12.5 MG tablet Take 1 tablet (12.5 mg total) by mouth 2 (two) times daily.  60 tablet  11  . fluticasone (FLONASE) 50 MCG/ACT nasal spray Place 1 spray into the nose daily.  16 g  2  . furosemide (LASIX) 20 MG tablet Take 1 tablet (20 mg total) by mouth daily.  30 tablet  6  . Hydrocodone-Acetaminophen 10-300 MG TABS Take 1 tablet by mouth every 8 (eight) hours as needed.  90 each  5  . KALETRA 200-50 MG per tablet TAKE TWO TABLETS TWICE DAILY  120 tablet  3  . lamiVUDine-zidovudine (COMBIVIR) 150-300 MG per tablet Take 1 tablet by mouth 2 (two) times daily.      Marland Kitchen lisinopril (PRINIVIL,ZESTRIL) 10 MG tablet Take 1 tablet (10 mg total) by mouth daily.  30 tablet  5  . pravastatin (PRAVACHOL) 40 MG tablet Take 1 tablet (40 mg total) by mouth daily.  30 tablet  5  . raltegravir (ISENTRESS) 400 MG tablet Take 1 tablet (400 mg total) by mouth 2 (two) times daily.  60 tablet  6  . cetirizine (ZYRTEC) 10 MG tablet Take 1 tablet (10 mg total) by mouth daily.  30 tablet  6  . lamiVUDine-zidovudine (COMBIVIR) 150-300 MG per tablet TAKE ONE TABLET TWICE DAILY  60 tablet  3  . lopinavir-ritonavir (KALETRA) 200-50 MG per tablet Take 2 tablets by mouth 2 (two) times daily.      . predniSONE (DELTASONE) 10 MG tablet Take 5 tablets daily x2 days, then 4 tablets daily x2  days, then 3 tablets daily x2 days, then 2 tablet daily x2 days, then 1 tablet daily x2 days  30 tablet  0   No current facility-administered medications on file prior to visit.   Active Ambulatory Problems    Diagnosis Date Noted  . HIV DISEASE 05/08/2006  . HYPERTRIGLYCERIDEMIA 11/01/2009  . Gout, unspecified 08/13/2009  . 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 ERECTILE DYSFUNCTION 06/08/2008  . PERIPHERAL NEUROPATHY 05/08/2006  . HYPERTENSION 05/08/2006  . SEIZURE DISORDER 05/08/2006  . ALKALINE PHOSPHATASE, ELEVATED 11/01/2009  . Leg swelling 08/27/2012  . CKD (chronic kidney disease) stage 3, GFR 30-59 ml/min 09/17/2012   Resolved Ambulatory Problems    Diagnosis Date Noted  . PULMONARY TUBERCULOSIS 05/08/2006  . CONDYLOMA ACUMINATA, PENIS 08/06/2006  . SYPHILIS NOS 05/08/2006  . ONYCHOMYCOSIS, TOENAILS 06/08/2008  . KIDNEY DISEASE, CHRONIC, STAGE II 09/27/2006  . ARTHRITIS, RIGHT SHOULDER 05/08/2006  . KNEE PAIN, BILATERAL 10/06/2008  . Dysuria 10/06/2008  . FRACTURE, RIBS, MULTIPLE 01/25/2009  . Sinusitis 03/04/2011  . Allergic rhinitis 03/04/2011  . Tinea capitis 03/04/2011  . Tachycardia 09/22/2011  . Boil, groin 02/03/2012  . Sinusitis 05/13/2012   Past Medical History  Diagnosis Date  . Hyperlipidemia   . Hypertension   .  Seizures   . Syphilis 1997  . Chronic kidney disease   . Sexually transmitted disease   . Rib fractures 01/2009  . HIV infection 1980's  . Male circumcision 11/2005       Review of Systems     Objective:   Physical Exam BP 166/98  Pulse 73  Temp(Src) 98.2 F (36.8 C) (Oral)  Wt 246 lb 12.8 oz (111.948 kg)  BMI 34.44 kg/m2 Physical Exam  Constitutional: He is oriented to person, place, and time. He appears well-developed and well-nourished. No distress.  HENT:  Mouth/Throat: Oropharynx is clear and moist. No oropharyngeal exudate.  Cardiovascular: Normal rate, regular rhythm and normal heart sounds. Exam reveals no gallop and no  friction rub.  No murmur heard.  Pulmonary/Chest: Effort normal and breath sounds normal. No respiratory distress. He has no wheezes.  Abdominal: Soft. Bowel sounds are normal. He exhibits no distension. There is no tenderness.  Lymphadenopathy:  He has no cervical adenopathy.  Neurological: He is alert and oriented to person, place, and time.  Skin: Skin is warm and dry. No rash noted. No erythema.  Psychiatric: He has a normal mood and affect. His behavior is normal.          Assessment & Plan:   hiv = will get labs today, continue his HIV regimen. Will discuss if we need to adjust given Cr 2.0; will get  pill box to help with med adherence  Ckd= for the past year ckd at 2.0; he is referred to [place] [facility] for evaluation and possibly adjustment of BP meds  Hypertension = he reports only taking carvedilol daily instead of bid. Asked him to use pill box and review meds with kidney specialist  Health maintenance = will check hep a and hep B serology  Drug interaction = Need to discontinue flonase since it can interact with protease inhibitors  rtc in 3 wks

## 2012-10-01 LAB — T-HELPER CELL (CD4) - (RCID CLINIC ONLY): CD4 % Helper T Cell: 31 % — ABNORMAL LOW (ref 33–55)

## 2012-10-03 LAB — HIV-1 RNA QUANT-NO REFLEX-BLD
HIV 1 RNA Quant: 23 copies/mL — ABNORMAL HIGH (ref <20)
HIV-1 RNA Quant, Log: 1.36 {Log} — ABNORMAL HIGH (ref <1.30)

## 2012-10-11 ENCOUNTER — Other Ambulatory Visit: Payer: Self-pay | Admitting: *Deleted

## 2012-10-11 DIAGNOSIS — M1990 Unspecified osteoarthritis, unspecified site: Secondary | ICD-10-CM

## 2012-10-12 ENCOUNTER — Ambulatory Visit (INDEPENDENT_AMBULATORY_CARE_PROVIDER_SITE_OTHER): Payer: Medicare Other | Admitting: Internal Medicine

## 2012-10-12 ENCOUNTER — Encounter: Payer: Self-pay | Admitting: Internal Medicine

## 2012-10-12 VITALS — BP 150/86 | HR 72 | Temp 97.6°F | Ht 71.0 in | Wt 253.3 lb

## 2012-10-12 DIAGNOSIS — G894 Chronic pain syndrome: Secondary | ICD-10-CM | POA: Diagnosis not present

## 2012-10-12 DIAGNOSIS — M7989 Other specified soft tissue disorders: Secondary | ICD-10-CM | POA: Diagnosis not present

## 2012-10-12 NOTE — Patient Instructions (Signed)
Will get UDS today Follow up in 1 month

## 2012-10-12 NOTE — Progress Notes (Signed)
Patient ID: Darrell Johnson, male   DOB: 1966-06-14, 47 y.o.   MRN: [id number] HPI: Darrell Johnson is well-known to me who is here for follow up.  A few weeks ago, he had left leg and knee swelling which we prescribed a course of prednisone which significantly improve his pain and swelling. It was thought that he had a gouty flare which is now resolved. However he states that he ate some red meat therefore he starts to feel the pain and swelling in his right knee. He is here today for a medication refill/Vicodin even though it was given to him in February with 5 refills. Narcotic database showed that he has been getting his Vicodin from different providers as well as different pharmacies. He states that Dr. Eddie Dibbles sent in the Vicodin-prescription electronically and that patient did not know. He states that his wife pick up his medication for him. He also states that [facility] does not carry Vicodin therefore he has to fill it up from [facility]. The 3 providers that he received from include internal medicine clinic, Dr. Eddie Dibbles, Dr. Jiles Crocker. He also tells me that he ran out of his pain medication about 2 days ago and his sister-in-law has been given him some pain medication which he did take the night before and last night; however, he does not know the name of this medication. Patient is upset because he states that he is doing what we asked him to do but does not understand why he has to get a urine drug screen.  He believes that he did not do anything wrong.  I explained to him that UDS as part of his contract and that he is only supposed to get his narcotics from one provider only. Also informed him that if he violated pain contract again, he will no longer get narcotics from our clinic. As for his joint pain, patient has been referred to rheumatology but has not gone to that appointment yet to help with his gout as we cannot titrate up his allopurinol given his CKD.  Review of system: As per history of present  illness  Physical examination:  General: alert, well-developed, and cooperative to examination.  Lungs: normal respiratory effort, no accessory muscle use, normal breath sounds, no crackles, and no wheezes. Heart: normal rate, regular rhythm, no murmur, no gallop, and no rub.  Abdomen: soft, non-tender, normal bowel sounds, no distention, no guarding, no rebound tenderness Neurologic: nonfocal EXT: Left knee: erythema and edema now resolved. Right knee: no effusion or erythema noted. FROM.   No effusion noted.  Psych: anxious

## 2012-10-12 NOTE — Telephone Encounter (Signed)
I wrote vicodin 10/300mg   With 5RF on 08/27/12. He does have RF

## 2012-10-12 NOTE — Assessment & Plan Note (Addendum)
Resolved on left leg. He finished prednisone tapering course.  He now reports mild swelling of his right knee after eating red meat.  He wants refill of his pain medication; however, narcotic database showed he has been getting narcotics from 3 different providers as well as different pharmacies: Dr. Jiles Crocker, Dr. Eddie Dibbles and Dr. Silverio Decamp although he has pain contract with our clinic.  I had a long discussion with him and he states that Dr. Eddie Dibbles "sent the Rx electronically" and patient was not aware and that his wife usually picks up his meds for him.  He also states that [facility] drug store does not carry hydrocodone therefore, he had to get it from [facility]. He also states that he ran out of his medications a few days ago so his sister in law gave him a few "pain pill" but he does not know the name of it.  Patient is somewhat upset and said that "I don't want to fight about it because I do what I'm supposed to do".  His last urine drug screen in 01/2012 showed both oxycodone and hydrocodone when he is only supposed to be on hydrocodone.  I discussed with Dr. Lynnae January about this case.  I will give him a second chance because I truly believe he has pain given his uncontrolled gout and patient was informed that he can only get narcotic from one provider and if he violates our contract again, he will no longer be able to receive any narcotic from [facility].  Please see scanned narcotic database sheets. -Obtained UDS -He has RF of his vicodin from last office visit -Will need close follow up -Will need frequent urine drug

## 2012-10-13 LAB — PRESCRIPTION ABUSE MONITORING 15P, URINE
Amphetamine/Meth: NEGATIVE ng/mL
Benzodiazepine Screen, Urine: NEGATIVE ng/mL
Cannabinoid Scrn, Ur: NEGATIVE ng/mL
Carisoprodol, Urine: NEGATIVE ng/mL
Cocaine Metabolites: NEGATIVE ng/mL
Creatinine, Urine: 69.64 mg/dL (ref >20.0)
Methadone Screen, Urine: NEGATIVE ng/mL
Zolpidem, Urine: NEGATIVE ng/mL

## 2012-10-14 LAB — OPIATES/OPIOIDS (LC/MS-MS)
Codeine Urine: NEGATIVE ng/mL
Heroin (6-AM), UR: NEGATIVE ng/mL
Hydrocodone: 86 ng/mL
Norhydrocodone, Ur: NEGATIVE ng/mL
Oxymorphone: 273 ng/mL

## 2012-10-14 LAB — OXYCODONE, URINE (LC/MS-MS)
Noroxycodone, Ur: NEGATIVE ng/mL
Oxycodone, ur: 66 ng/mL
Oxymorphone: 273 ng/mL

## 2012-10-14 LAB — TRAMADOL, URINE
N-DESMETHYL-CIS-TRAMADOL: 186 ng/mL — ABNORMAL HIGH
Tramadol, Urine: 910 ng/mL — ABNORMAL HIGH

## 2012-10-26 ENCOUNTER — Encounter: Payer: Self-pay | Admitting: *Deleted

## 2012-10-26 ENCOUNTER — Ambulatory Visit
Admission: RE | Admit: 2012-10-26 | Discharge: 2012-10-26 | Disposition: A | Discharge status: Home / Self Care | Payer: Medicare Other | Source: Ambulatory Visit | Attending: Nephrology | Admitting: Nephrology

## 2012-10-26 ENCOUNTER — Other Ambulatory Visit: Payer: Self-pay | Admitting: Nephrology

## 2012-10-26 DIAGNOSIS — N189 Chronic kidney disease, unspecified: Secondary | ICD-10-CM | POA: Diagnosis not present

## 2012-10-26 DIAGNOSIS — N390 Urinary tract infection, site not specified: Secondary | ICD-10-CM | POA: Diagnosis not present

## 2012-10-26 DIAGNOSIS — B20 Human immunodeficiency virus [HIV] disease: Secondary | ICD-10-CM | POA: Diagnosis not present

## 2012-10-26 DIAGNOSIS — I129 Hypertensive chronic kidney disease with stage 1 through stage 4 chronic kidney disease, or unspecified chronic kidney disease: Secondary | ICD-10-CM | POA: Diagnosis not present

## 2012-11-02 ENCOUNTER — Other Ambulatory Visit: Payer: Self-pay | Admitting: *Deleted

## 2012-11-02 DIAGNOSIS — M109 Gout, unspecified: Secondary | ICD-10-CM

## 2012-11-03 MED ORDER — ALLOPURINOL 100 MG PO TABS: 150.0000 mg | ORAL_TABLET | Freq: Every day | ORAL | Status: DC

## 2012-11-04 ENCOUNTER — Telehealth: Payer: Self-pay | Admitting: *Deleted

## 2012-11-04 NOTE — Telephone Encounter (Signed)
Allopurinol 100mg  rx faxed to [place].

## 2012-11-12 ENCOUNTER — Encounter: Payer: Self-pay | Admitting: Radiation Oncology

## 2012-11-12 ENCOUNTER — Ambulatory Visit (INDEPENDENT_AMBULATORY_CARE_PROVIDER_SITE_OTHER): Payer: Medicare Other | Admitting: Radiation Oncology

## 2012-11-12 VITALS — BP 158/95 | HR 86 | Temp 98.2°F | Ht 71.0 in | Wt 250.8 lb

## 2012-11-12 DIAGNOSIS — B20 Human immunodeficiency virus [HIV] disease: Secondary | ICD-10-CM | POA: Diagnosis not present

## 2012-11-12 DIAGNOSIS — G894 Chronic pain syndrome: Secondary | ICD-10-CM

## 2012-11-12 DIAGNOSIS — M1990 Unspecified osteoarthritis, unspecified site: Secondary | ICD-10-CM | POA: Diagnosis not present

## 2012-11-12 DIAGNOSIS — I10 Essential (primary) hypertension: Secondary | ICD-10-CM

## 2012-11-12 DIAGNOSIS — N183 Chronic kidney disease, stage 3 unspecified: Secondary | ICD-10-CM | POA: Diagnosis not present

## 2012-11-12 DIAGNOSIS — M7989 Other specified soft tissue disorders: Secondary | ICD-10-CM

## 2012-11-12 DIAGNOSIS — E785 Hyperlipidemia, unspecified: Secondary | ICD-10-CM

## 2012-11-12 MED ORDER — LAMIVUDINE-ZIDOVUDINE 150-300 MG PO TABS: 1.0000 | ORAL_TABLET | Freq: Two times a day (BID) | ORAL | Status: DC

## 2012-11-12 MED ORDER — LOPINAVIR-RITONAVIR 200-50 MG PO TABS: 2.0000 | ORAL_TABLET | Freq: Two times a day (BID) | ORAL | Status: DC

## 2012-11-12 NOTE — Patient Instructions (Addendum)
Please continue taking your medications as prescribed.   Contact us if you have any new or worsening symptoms. It was nice to meet you. Have a great day.

## 2012-11-12 NOTE — Assessment & Plan Note (Signed)
BP Readings from Last 3 Encounters:  11/12/12 158/95  10/12/12 150/86  09/30/12 166/98    Lab Results  Component Value Date   NA 139 09/30/2012   K 4.3 09/30/2012   CREATININE 2.13* 09/30/2012    Assessment: Blood pressure control: mildly elevated Progress toward BP goal:  unchanged Comments: patient's BP is elevated in clinic today, however this may reflect his stated frustration regarding obtaining his pain medications. He states he checks his blood pressure regularly at home, with systolics in the [id number] range.  Plan: Medications:  continue current medications Educational resources provided: brochure Self management tools provided:   Other plans: as patient maintains BP is normal at home, no changes in medications will be made today.

## 2012-11-12 NOTE — Progress Notes (Signed)
Subjective:    Patient ID: Darrell Johnson, male    DOB: 11-Sep-1965, 47 y.o.   MRN: [id number]  HPI Patient is a 47 year old man who presents to clinic today for followup. He was last seen for issues involving pain management of gout. He denies any recent gout flares since this previous visit on 10/12/2012. At that visit, he had a long discussion with Dr. Silverio Decamp regarding his controlled medication contract and concerning behavior in the past that appeared to be violations of this. UDS results from that visit were positive for oxycodone and tramadol. Patient states he had been given Percocet from his sister-in-law, and he had taken tramadol that was given to him by someone else as well, although he thought that this was a sleep medication per his report today.  He states he has not been able to fill his Vicodin prescription since his last visit as his previous pharmacy at which he had refills has closed down. He is frustrated with the process involved with the controlled medication contract, but is willing to submit a urine sample for UDS today. He denies filling any opioid scripts from other providers since last seen.  Review of Systems  All other systems reviewed and are negative.   Current Outpatient Medications: Current Outpatient Prescriptions  Medication Sig Dispense Refill  . allopurinol (ZYLOPRIM) 100 MG tablet Take 1.5 tablets (150 mg total) by mouth daily.  135 tablet  3  . carvedilol (COREG) 12.5 MG tablet Take 1 tablet (12.5 mg total) by mouth 2 (two) times daily.  60 tablet  11  . cetirizine (ZYRTEC) 10 MG tablet Take 1 tablet (10 mg total) by mouth daily.  30 tablet  6  . furosemide (LASIX) 20 MG tablet Take 1 tablet (20 mg total) by mouth daily.  30 tablet  6  . Hydrocodone-Acetaminophen 10-300 MG TABS Take 1 tablet by mouth every 8 (eight) hours as needed.  90 each  5  . lamiVUDine-zidovudine (COMBIVIR) 150-300 MG per tablet Take 1 tablet by mouth 2 (two) times daily.  60 tablet  3   . lisinopril (PRINIVIL,ZESTRIL) 10 MG tablet Take 1 tablet (10 mg total) by mouth daily.  30 tablet  5  . lopinavir-ritonavir (KALETRA) 200-50 MG per tablet Take 2 tablets by mouth 2 (two) times daily.  120 tablet  3  . pravastatin (PRAVACHOL) 40 MG tablet Take 1 tablet (40 mg total) by mouth daily.  30 tablet  5  . raltegravir (ISENTRESS) 400 MG tablet Take 1 tablet (400 mg total) by mouth 2 (two) times daily.  60 tablet  6   No current facility-administered medications for this visit.    Allergies: No Known Allergies   Past Medical History: Past Medical History  Diagnosis Date  . 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 Hypertension   . Seizures     last seizure was >5 years ago, pt has family history of seizures  . Syphilis 1997    history of syphilis 1997  . Chronic kidney disease     stage !! CKD, followed by Fr. Clover Mealy  . 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 Rib fractures 01/2009  . HIV infection 1980's    on ART therapy since, followed by ID clinic, complicated  by neuropathy  . Male circumcision  11/2005    Past Surgical History: No past surgical history on file.  Family History: Family History  Problem Relation Age of Onset  . Cancer Mother   . COPD Father   . Diabetes Sister   . Hypertension Sister   . Diabetes Brother   . Hypertension Brother   . Stroke Neg Hx     Social History: History   Social History  . Marital Status: Married    Spouse Name: N/A    Number of Children: N/A  . Years of Education: N/A   Occupational History  . Not on file.   Social History Main Topics  . Smoking status: Current Every Day Smoker -- 0.30 packs/day for 20 years    Types: Cigarettes  . Smokeless tobacco: Never Used  . Alcohol Use: Yes     Comment: occasional  . Drug  Use: No  . Sexually Active: Yes     Comment: pt. given condoms   Other Topics Concern  . Not on file   Social History Narrative  . No narrative on file     Vital Signs: Blood pressure 158/95, pulse 86, temperature 98.2 F (36.8 C), temperature source Oral, height 5\' 11"  (1.803 m), weight 250 lb 12.8 oz (113.762 kg), SpO2 99.00%.       Objective:   Physical Exam  Constitutional: He is oriented to person, place, and time. He appears well-developed and well-nourished. No distress.  HENT:  Head: Normocephalic and atraumatic.  Eyes: Conjunctivae are normal. Pupils are equal, round, and reactive to light. No scleral icterus.  Neck: Normal range of motion. Neck supple. No tracheal deviation present.  Cardiovascular: Normal rate and regular rhythm.   No murmur heard. Pulmonary/Chest: Effort normal and breath sounds normal. He has no wheezes.  Abdominal: Soft. Bowel sounds are normal. There is no tenderness.  Musculoskeletal: Normal range of motion. He exhibits no edema.  Small (<1cm) non-tender L popliteal cyst.   Neurological: He is alert and oriented to person, place, and time. No cranial nerve deficit.  Skin: Skin is warm and dry. No erythema.  Psychiatric: He has a normal mood and affect. His behavior is normal.          Assessment & Plan:

## 2012-11-12 NOTE — Progress Notes (Signed)
Case discussed with Dr. Para Skeans  at the time of the visit, immediately after the resident saw the patient.  I reviewed the resident's history and exam and pertinent patient test results.  I agree with the assessment, diagnosis and plan of care documented in the resident's note.

## 2012-11-12 NOTE — Assessment & Plan Note (Addendum)
Pt follows with Dr. Moshe Cipro who he states he saw in the interim since his previous [place] visit on 10/12/2012. Records from visit not yet available in EMR.

## 2012-11-12 NOTE — Assessment & Plan Note (Signed)
Patient was willing to submit UDS sample, however he stated that it might be positive as he thinks he might have again taken a pain medication given to him by his sister-in-law within the last 1-2 weeks. Given he has not been able to fill his Vicodin prescription per his report, if his UDS is again positive today for opioids or other illicit/controlled substances this would likely represent a second violation of the contract and make him ineligible for future opioid treatment the [facility] (although ultimately this will be a decision for his PCP). Patient understands this would be the likely outcome, and states today that he would be all right with stopping the medications since it would avoid him the frustration of being kept accountable through regularly drug screens.  - Check UDS => will forward results to PCP for further management

## 2012-11-13 LAB — PRESCRIPTION ABUSE MONITORING 15P, URINE
Benzodiazepine Screen, Urine: NEGATIVE ng/mL
Buprenorphine, Urine: NEGATIVE ng/mL
Carisoprodol, Urine: NEGATIVE ng/mL
Methadone Screen, Urine: NEGATIVE ng/mL
Oxycodone Screen, Ur: NEGATIVE ng/mL
Tramadol Scrn, Ur: NEGATIVE ng/mL

## 2012-11-17 LAB — OPIATES/OPIOIDS (LC/MS-MS)
Codeine Urine: NEGATIVE ng/mL
Heroin (6-AM), UR: NEGATIVE ng/mL
Hydrocodone: 99 ng/mL
Morphine Urine: NEGATIVE ng/mL
Norhydrocodone, Ur: NEGATIVE ng/mL
Oxymorphone: NEGATIVE ng/mL

## 2012-11-18 DIAGNOSIS — Z79899 Other long term (current) drug therapy: Secondary | ICD-10-CM | POA: Diagnosis not present

## 2012-11-18 DIAGNOSIS — M2550 Pain in unspecified joint: Secondary | ICD-10-CM | POA: Diagnosis not present

## 2012-11-18 DIAGNOSIS — M109 Gout, unspecified: Secondary | ICD-10-CM | POA: Diagnosis not present

## 2012-11-20 ENCOUNTER — Encounter (HOSPITAL_COMMUNITY): Payer: Self-pay | Admitting: Physical Medicine and Rehabilitation

## 2012-11-20 ENCOUNTER — Emergency Department (HOSPITAL_COMMUNITY)
Admission: EM | Admit: 2012-11-20 | Discharge: 2012-11-20 | Disposition: A | Discharge status: Home / Self Care | Payer: Medicare Other | Attending: Emergency Medicine | Admitting: Emergency Medicine

## 2012-11-20 ENCOUNTER — Other Ambulatory Visit: Payer: Self-pay | Admitting: Infectious Diseases

## 2012-11-20 DIAGNOSIS — M546 Pain in thoracic spine: Secondary | ICD-10-CM | POA: Diagnosis not present

## 2012-11-20 DIAGNOSIS — M549 Dorsalgia, unspecified: Secondary | ICD-10-CM | POA: Diagnosis not present

## 2012-11-20 DIAGNOSIS — F17200 Nicotine dependence, unspecified, uncomplicated: Secondary | ICD-10-CM | POA: Diagnosis not present

## 2012-11-20 DIAGNOSIS — G40909 Epilepsy, unspecified, not intractable, without status epilepticus: Secondary | ICD-10-CM | POA: Insufficient documentation

## 2012-11-20 DIAGNOSIS — M25519 Pain in unspecified shoulder: Secondary | ICD-10-CM | POA: Insufficient documentation

## 2012-11-20 DIAGNOSIS — N189 Chronic kidney disease, unspecified: Secondary | ICD-10-CM | POA: Diagnosis not present

## 2012-11-20 DIAGNOSIS — E785 Hyperlipidemia, unspecified: Secondary | ICD-10-CM | POA: Insufficient documentation

## 2012-11-20 DIAGNOSIS — G8929 Other chronic pain: Secondary | ICD-10-CM | POA: Diagnosis not present

## 2012-11-20 DIAGNOSIS — I129 Hypertensive chronic kidney disease with stage 1 through stage 4 chronic kidney disease, or unspecified chronic kidney disease: Secondary | ICD-10-CM | POA: Insufficient documentation

## 2012-11-20 DIAGNOSIS — Z21 Asymptomatic human immunodeficiency virus [HIV] infection status: Secondary | ICD-10-CM | POA: Diagnosis not present

## 2012-11-20 DIAGNOSIS — Z79899 Other long term (current) drug therapy: Secondary | ICD-10-CM | POA: Insufficient documentation

## 2012-11-20 DIAGNOSIS — Z8781 Personal history of (healed) traumatic fracture: Secondary | ICD-10-CM | POA: Diagnosis not present

## 2012-11-20 MED ORDER — OXYCODONE-ACETAMINOPHEN 5-325 MG PO TABS: 1.0000 | ORAL_TABLET | Freq: Once | ORAL | Status: DC

## 2012-11-20 MED ORDER — OXYCODONE-ACETAMINOPHEN 5-325 MG PO TABS
2.0000 | ORAL_TABLET | Freq: Once | ORAL | Status: AC
  Administered 2012-11-20: 2 via ORAL
  Filled 2012-11-20: qty 2

## 2012-11-20 NOTE — ED Notes (Signed)
Pt reports his LT shoulder blade huts 10/10 . Pt reports he woke up with the pain and denies any Hx,  Pt"s PCP d/ced his B/P meds because his HR was to fast. Pt B/P 182/100 with a HR of 103 .  Pt does report his Father has a HX of MI.

## 2012-11-20 NOTE — ED Provider Notes (Signed)
History     CSN: [id number]  Arrival date & time 11/20/12  1611   First MD Initiated Contact with Patient 11/20/12 1616      Chief Complaint  Patient presents with  . Back Pain  . Shoulder Pain    The history is provided by the patient.   patient reports worsening pain in his left scapular region over the past 24-48 hours.  No recent fall or trauma.  The patient has a history of chronic pain for which she is normally on 10 mg hydrocodone's.  He is having issues with his primary care physician in his pain contract and he has had some dilations of his pain contract.  He states he is currently without any pain medications.  States he's hurting severely.  He denies weakness of his upper lower extremities.  No chest pain shortness of breath.  He has no other complaints.  His pain in his left scapular region is worse with range of motion of his left arm.  Past Medical History  Diagnosis Date  . 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 Hypertension   . Seizures     last seizure was >5 years ago, pt has family history of seizures  . Syphilis 1997    history of syphilis 1997  . Chronic kidney disease     stage !! CKD, followed by Fr. Clover Mealy  . 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 Rib fractures 01/2009  . HIV infection 1980's    on ART therapy since, followed by ID clinic, complicated  by neuropathy  . 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 No pertinent past surgical history.  Family History  Problem Relation Age of Onset  . Cancer Mother   . COPD Father   . Diabetes Sister   . Hypertension Sister   . Diabetes Brother   . Hypertension Brother   . Stroke Neg Hx     History  Substance Use Topics  . Smoking status: Current Every Day Smoker -- 0.30 packs/day for 20 years   Types: Cigarettes  . Smokeless tobacco: Never Used  . Alcohol Use: Yes     Comment: occasional      Review of Systems  Musculoskeletal: Positive for back pain.  All other systems reviewed and are negative.    Allergies  Review of patient's allergies indicates no known allergies.  Home Medications   Current Outpatient Rx  Name  Route  Sig  Dispense  Refill  . colchicine 0.6 MG tablet   Oral   Take 0.6 mg by mouth daily.         Marland Kitchen HYDROcodone-acetaminophen (NORCO/VICODIN) 5-325 MG per tablet   Oral   Take 1 tablet by mouth once.         . lamiVUDine-zidovudine (COMBIVIR) 150-300 MG per tablet   Oral   Take 1 tablet by mouth 2 (two) times daily.   60 tablet   3   . lisinopril (PRINIVIL,ZESTRIL) 10 MG tablet   Oral   Take 1 tablet (10 mg total) by mouth daily.   30 tablet   5     Please disregard the 5mg  Rx.   . lopinavir-ritonavir (KALETRA) 200-50 MG per tablet   Oral   Take 2 tablets by mouth 2 (two) times daily.  120 tablet   3   . pravastatin (PRAVACHOL) 40 MG tablet   Oral   Take 1 tablet (40 mg total) by mouth daily.   30 tablet   5   . raltegravir (ISENTRESS) 400 MG tablet   Oral   Take 1 tablet (400 mg total) by mouth 2 (two) times daily.   60 tablet   6   . EXPIRED: colchicine 0.6 MG tablet   Oral   Take 0.6 mg by mouth daily.           BP 174/97  Pulse 108  Temp(Src) 98.7 F (37.1 C) (Oral)  Resp 18  SpO2 95%  Physical Exam  Nursing note and vitals reviewed. Constitutional: He is oriented to person, place, and time. He appears well-developed and well-nourished.  HENT:  Head: Normocephalic and atraumatic.  Eyes: EOM are normal.  Neck: Normal range of motion.  Cardiovascular: Normal rate, regular rhythm, normal heart sounds and intact distal pulses.   Pulmonary/Chest: Effort normal and breath sounds normal. No respiratory distress.  Abdominal: Soft. He exhibits no distension. There is no tenderness.  Genitourinary: Rectum  normal.  Musculoskeletal: Normal range of motion.  Tenderness of his left thoracic paraspinal muscles.  No spasm noted.  No frank tenderness of the left scapula.  No herpetic lesions noted  Neurological: He is alert and oriented to person, place, and time.  Skin: Skin is warm and dry.  Psychiatric: He has a normal mood and affect. Judgment normal.    ED Course  Procedures (including critical care time)  Labs Reviewed - No data to display No results found.   1. Back pain   2. Chronic pain       MDM  Musculoskeletal left scapular pain.  Percocet given.  The patient be discharged home to followup with his primary care physician.  He has a pain contract with his pain physician.  I would not be a right patient a prescription for pain medicines.  He understands this        Hoy Morn, MD 11/20/12 1753

## 2012-11-20 NOTE — ED Notes (Signed)
Pt presents to department for evaluation of L shoulder and L sided back pain. 9/10 pain at the time, increases with movement. Pt is conscious alert and oriented x4. Denies recent injury.

## 2012-11-29 NOTE — Addendum Note (Signed)
Addended by: Truddie Crumble on: 11/29/2012 03:11 PM   Modules accepted: Orders

## 2012-12-03 DIAGNOSIS — M109 Gout, unspecified: Secondary | ICD-10-CM | POA: Diagnosis not present

## 2012-12-23 ENCOUNTER — Other Ambulatory Visit: Payer: Medicare Other

## 2012-12-23 DIAGNOSIS — B20 Human immunodeficiency virus [HIV] disease: Secondary | ICD-10-CM

## 2012-12-23 LAB — CBC WITH DIFFERENTIAL/PLATELET
Eosinophils Relative: 3 % (ref 0–5)
HCT: 36.8 % — ABNORMAL LOW (ref 39.0–52.0)
Hemoglobin: 13 g/dL (ref 13.0–17.0)
Lymphocytes Relative: 31 % (ref 12–46)
MCHC: 35.3 g/dL (ref 30.0–36.0)
MCV: 93.2 fL (ref 78.0–100.0)
Monocytes Absolute: 0.9 10*3/uL (ref 0.1–1.0)
Monocytes Relative: 10 % (ref 3–12)
Neutro Abs: 4.7 10*3/uL (ref 1.7–7.7)
WBC: 8.5 10*3/uL (ref 4.0–10.5)

## 2012-12-23 LAB — COMPREHENSIVE METABOLIC PANEL
ALT: 11 U/L (ref 0–53)
Albumin: 3.1 g/dL — ABNORMAL LOW (ref 3.5–5.2)
CO2: 29 mEq/L (ref 19–32)
Calcium: 8.4 mg/dL (ref 8.4–10.5)
Chloride: 104 mEq/L (ref 96–112)
Glucose, Bld: 68 mg/dL — ABNORMAL LOW (ref 70–99)
Sodium: 141 mEq/L (ref 135–145)
Total Protein: 5.8 g/dL — ABNORMAL LOW (ref 6.0–8.3)

## 2012-12-24 LAB — T-HELPER CELL (CD4) - (RCID CLINIC ONLY)
CD4 % Helper T Cell: 30 % — ABNORMAL LOW (ref 33–55)
CD4 T Cell Abs: 810 uL (ref 400–2700)

## 2012-12-24 LAB — HIV-1 RNA QUANT-NO REFLEX-BLD: HIV-1 RNA Quant, Log: <1.3 {Log} (ref <1.30)

## 2012-12-27 ENCOUNTER — Emergency Department (HOSPITAL_COMMUNITY)
Admission: EM | Admit: 2012-12-27 | Discharge: 2012-12-27 | Disposition: A | Discharge status: Home / Self Care | Payer: Medicare Other | Attending: Emergency Medicine | Admitting: Emergency Medicine

## 2012-12-27 ENCOUNTER — Encounter (HOSPITAL_COMMUNITY): Payer: Self-pay | Admitting: Emergency Medicine

## 2012-12-27 DIAGNOSIS — Z8619 Personal history of other infectious and parasitic diseases: Secondary | ICD-10-CM | POA: Diagnosis not present

## 2012-12-27 DIAGNOSIS — I129 Hypertensive chronic kidney disease with stage 1 through stage 4 chronic kidney disease, or unspecified chronic kidney disease: Secondary | ICD-10-CM | POA: Insufficient documentation

## 2012-12-27 DIAGNOSIS — Z21 Asymptomatic human immunodeficiency virus [HIV] infection status: Secondary | ICD-10-CM | POA: Diagnosis not present

## 2012-12-27 DIAGNOSIS — R269 Unspecified abnormalities of gait and mobility: Secondary | ICD-10-CM | POA: Diagnosis not present

## 2012-12-27 DIAGNOSIS — Z412 Encounter for routine and ritual male circumcision: Secondary | ICD-10-CM | POA: Diagnosis not present

## 2012-12-27 DIAGNOSIS — M109 Gout, unspecified: Secondary | ICD-10-CM | POA: Diagnosis not present

## 2012-12-27 DIAGNOSIS — E785 Hyperlipidemia, unspecified: Secondary | ICD-10-CM | POA: Insufficient documentation

## 2012-12-27 DIAGNOSIS — F17200 Nicotine dependence, unspecified, uncomplicated: Secondary | ICD-10-CM | POA: Diagnosis not present

## 2012-12-27 DIAGNOSIS — N189 Chronic kidney disease, unspecified: Secondary | ICD-10-CM | POA: Insufficient documentation

## 2012-12-27 DIAGNOSIS — Z79899 Other long term (current) drug therapy: Secondary | ICD-10-CM | POA: Diagnosis not present

## 2012-12-27 MED ORDER — OXYCODONE-ACETAMINOPHEN 5-325 MG PO TABS
2.0000 | ORAL_TABLET | Freq: Once | ORAL | Status: AC
  Administered 2012-12-27: 2 via ORAL
  Filled 2012-12-27: qty 2

## 2012-12-27 MED ORDER — PREDNISONE 10 MG PO TABS: ORAL_TABLET | ORAL | Status: DC

## 2012-12-27 MED ORDER — KETOROLAC TROMETHAMINE 30 MG/ML IJ SOLN
60.0000 mg | Freq: Once | INTRAMUSCULAR | Status: AC
  Administered 2012-12-27: 60 mg via INTRAMUSCULAR
  Filled 2012-12-27: qty 2

## 2012-12-27 NOTE — ED Notes (Signed)
Pt c/o right knee pain from gout x 3 days; pt sts hx of same

## 2012-12-27 NOTE — ED Provider Notes (Signed)
History     CSN: [id number]  Arrival date & time 12/27/12  0840   First MD Initiated Contact with Patient 12/27/12 0915      Chief Complaint  Patient presents with  . Knee Pain  . Gout    (Consider location/radiation/quality/duration/timing/severity/associated sxs/prior treatment) HPI Comments: 47 y.o. Male with PMHx of gout (bilateral knees), HIV, CKD (stage 4), HTN, syphilis presents today complaining of a flair up to chronic gout in his right knee that started three days ago and has not gotten better.  Described as sharp and throbbing. Localized. States pain is 10/10, worse with movement, better with rest. Review of records shows pain management contract which pt states has been called in to question by providing doctor and he is having a hard time getting pain medication. Pt states this pain is similar to his previous gout flare ups and is seeking relief from pain. Denies fevers, chills, recent injury.   Patient is a 47 y.o. male presenting with knee pain.  Knee Pain Associated symptoms: no fever and no neck pain     Past Medical History  Diagnosis Date  . 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 Hypertension   . Seizures     last seizure was >5 years ago, pt has family history of seizures  . Syphilis 1997    history of syphilis 1997  . Chronic kidney disease     stage !! CKD, followed by Fr. Clover Mealy  . 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 Rib fractures 01/2009  . HIV infection 1980's    on ART therapy since, followed by ID clinic, complicated  by neuropathy  . 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 No pertinent past surgical history.  Family History  Problem Relation Age of Onset  . Cancer Mother   . COPD Father   . Diabetes Sister   . Hypertension Sister   .  Diabetes Brother   . Hypertension Brother   . Stroke Neg Hx     History  Substance Use Topics  . Smoking status: Current Every Day Smoker -- 0.30 packs/day for 20 years    Types: Cigarettes  . Smokeless tobacco: Never Used  . Alcohol Use: Yes     Comment: occasional      Review of Systems  Constitutional: Negative for fever and diaphoresis.  HENT: Negative for neck pain and neck stiffness.   Eyes: Negative for visual disturbance.  Respiratory: Negative for apnea, chest tightness and shortness of breath.   Cardiovascular: Negative for chest pain and palpitations.  Gastrointestinal: Negative for nausea, vomiting, diarrhea and constipation.  Genitourinary: Negative for dysuria.  Musculoskeletal: Positive for arthralgias and gait problem.       Pain to right knee, worse with weight bearing  Skin: Negative for rash.  Neurological: Negative for dizziness, weakness, light-headedness, numbness and headaches.    Allergies  Review of patient's allergies indicates no known allergies.  Home Medications   Current Outpatient Rx  Name  Route  Sig  Dispense  Refill  . allopurinol (ZYLOPRIM) 100 MG tablet   Oral   Take 100 mg by mouth daily.          Marland Kitchen amLODipine (NORVASC) 10 MG tablet   Oral   Take 10 mg by mouth daily.          Marland Kitchen  colchicine 0.6 MG tablet   Oral   Take 0.6 mg by mouth daily.         . ISENTRESS 400 MG tablet      TAKE ONE TABLET TWICE DAILY   60 tablet   6   . lamiVUDine-zidovudine (COMBIVIR) 150-300 MG per tablet   Oral   Take 1 tablet by mouth 2 (two) times daily.   60 tablet   3   . lisinopril (PRINIVIL,ZESTRIL) 10 MG tablet   Oral   Take 1 tablet (10 mg total) by mouth daily.   30 tablet   5     Please disregard the 5mg  Rx.   . lopinavir-ritonavir (KALETRA) 200-50 MG per tablet   Oral   Take 2 tablets by mouth 2 (two) times daily.   120 tablet   3   . pravastatin (PRAVACHOL) 40 MG tablet   Oral   Take 1 tablet (40 mg total) by  mouth daily.   30 tablet   5   . EXPIRED: colchicine 0.6 MG tablet   Oral   Take 0.6 mg by mouth daily.         Marland Kitchen HYDROcodone-acetaminophen (NORCO/VICODIN) 5-325 MG per tablet   Oral   Take 1 tablet by mouth once.           BP 180/107  Pulse 103  Temp(Src) 98.3 F (36.8 C) (Oral)  Resp 20  SpO2 99%  Physical Exam  Nursing note and vitals reviewed. Constitutional: He is oriented to person, place, and time. He appears well-developed and well-nourished. No distress.  HENT:  Head: Normocephalic and atraumatic.  Eyes: EOM are normal. Pupils are equal, round, and reactive to light.  Neck: Normal range of motion. Neck supple.  No meningeal signs  Cardiovascular: Normal rate, regular rhythm and normal heart sounds.  Exam reveals no gallop and no friction rub.   No murmur heard. Pulmonary/Chest: Effort normal and breath sounds normal. No respiratory distress. He has no wheezes. He has no rales. He exhibits no tenderness.  Abdominal: Soft. Bowel sounds are normal. He exhibits no distension. There is no tenderness. There is no rebound and no guarding.  Musculoskeletal: Normal range of motion. He exhibits no edema and no tenderness.  5/5 strength throughout. Mild swelling. No erythema. No warmth. No effusion. Good quadricep strength on straight leg raise. No joint laxity. Tenderness to suprapatellar area on exam.   Neurological: He is alert and oriented to person, place, and time. No cranial nerve deficit.  Skin: Skin is warm and dry. He is not diaphoretic. No erythema.  Psychiatric: He has a normal mood and affect.    ED Course  Procedures (including critical care time) Medications  ketorolac (TORADOL) 30 MG/ML injection 60 mg (60 mg Intramuscular Given 12/27/12 0950)  oxyCODONE-acetaminophen (PERCOCET/ROXICET) 5-325 MG per tablet 2 tablet (2 tablets Oral Given 12/27/12 1033)    Labs Reviewed - No data to display No results found.  Discharge Medication List as of 12/27/2012 10:44  AM    START taking these medications   Details  predniSONE (DELTASONE) 10 MG tablet Take 3 tablets by mouth daily for 3 days; then 2 tablets daily by mouth for 3 days; then 1 tablet daily by mouth for 3 days., Print        1. Gout, unspecified       MDM  Considering HPI and PE, including lack of trauma, hx of chronic conditions, interventions that have worked in the past, no swelling, no erythema,  no warmth, no effusion, no deformity, no bony tenderness, no fever or impaired ROM, not concerned for acute bony injury or septic arthritis. Pt himself states his pain management contract is in question and asked for pain relief for 2 1/2 weeks until his next appointment with his doctor. Pt states he has colchicine at home and did take it today. Pt states he did not take his BP medicine today. Denies experiencing headaches, visual disturbances. Discussed the hospital policy on treatment for chronic pain management. Pt says he understands. Discussed reasons to seek immediate care. Patient expresses understanding and agrees with plan.      Coralee North, PA-C 12/27/12 2146

## 2012-12-27 NOTE — Progress Notes (Signed)
Orthopedic Tech Progress Note Patient Details:  Darrell Johnson 25-May-1966 [id number] Applied knee immob. to RLE.  Fit crutches, instructed pt. In use of crutches. Ortho Devices Type of Ortho Device: Knee Immobilizer Ortho Device/Splint Location: RLE Ortho Device/Splint Interventions: Application   [name] 12/27/2012, 1:14 PM

## 2012-12-29 ENCOUNTER — Telehealth: Payer: Self-pay | Admitting: *Deleted

## 2012-12-29 NOTE — Telephone Encounter (Signed)
Pt calls c/o about not getting pain med, rambles on and really cannot understand all he is saying. appt is made for 6/17 dr brown @ 1445.  narcotic database printed, in dr Roosvelt Harps box

## 2012-12-30 NOTE — ED Provider Notes (Signed)
History/physical exam/procedure(s) were performed by non-physician practitioner and as supervising physician I was immediately available for consultation/collaboration. I have reviewed all notes and am in agreement with care and plan.   Shaune Pollack, MD 12/30/12 [phone]

## 2012-12-30 NOTE — Telephone Encounter (Signed)
Please see FYI about pt getting narcotics from different providers

## 2013-01-04 ENCOUNTER — Ambulatory Visit: Payer: Medicare Other | Admitting: Internal Medicine

## 2013-01-04 ENCOUNTER — Encounter: Payer: Self-pay | Admitting: Internal Medicine

## 2013-01-04 ENCOUNTER — Ambulatory Visit (INDEPENDENT_AMBULATORY_CARE_PROVIDER_SITE_OTHER): Payer: Medicare Other | Admitting: Internal Medicine

## 2013-01-04 VITALS — BP 180/101 | HR 96 | Temp 98.3°F | Ht 70.5 in | Wt 253.2 lb

## 2013-01-04 DIAGNOSIS — M109 Gout, unspecified: Secondary | ICD-10-CM | POA: Diagnosis not present

## 2013-01-04 MED ORDER — HYDROCODONE-ACETAMINOPHEN 5-325 MG PO TABS: 1.0000 | ORAL_TABLET | Freq: Four times a day (QID) | ORAL | Status: DC | PRN

## 2013-01-04 NOTE — Patient Instructions (Addendum)
1. Will call you tomorrow for the fluid results 2. Please go to the urgent care for steroids injections if you can not come to the clinic during the office hour.  3. We will contact Dr. Lenna Gilford office. 4. Please schedule an appt to discuss your pain contract once your acute problem is resolved.

## 2013-01-04 NOTE — Progress Notes (Addendum)
Subjective:   Patient ID: Darrell Johnson male   DOB: 06/14/1966 47 y.o.   MRN: [id number]  HPI: [name] is a 47 y.o. man with PMH of Gout ( bilateral knees), HTN, HLD,  CKD followed by Dr. Clover Mealy, history of syphilis in 1997, STD with gonorrhea, trichomona, and penile condylomata, HIV on ART tx ( last CD 4 850 and VL < 20 on 12/22/12), who presents to the clinic for bilateral knee pain.   Patient reports that he started to have gout flareup 10 days ago after he ate some steak. It started with his right knee. He described as sharp, burning and throbbing pain, 10/10, worse with activity and better with rest. Pt states this pain is similar to his previous gout flare ups.  He went to ED on 12/27/12 and was given Ketorolac 60 mg IM x 1 dose, Percocet 5/325 x 2 tablets and prednisone tapering dose.  He reports that his right knee pain and swelling gradually becomes better, however, he noticed left knee gradual onset similar pain x 1 day while still on prednisone tapering. Admits drinking 2 cups of liquor 2 days ago.  Denies fever, chill, headache, chest pain/chest pressure, SOB, N/V, abdominal pain or weakness. He is here for evaluation.   Of note, he reports that he has confirmed gout from arthrocentesis by orthopedic surgeon Dr. Maia Breslow at [facility] in 2010 and 2012.  He received steroids injections for his gout in the past. Last flare up was in Feb. 2014 when his symptoms were resolved with prednisone tapering dose.  Subsequently, he was referred to Rheumatology Dr. Trudie Reed for gout management.  He reports that Dr. Trudie Reed removed his allopurinol and started an new medication. He is unsure the name and dosage.   Additionally, per chart review, he is documented to get narcotics from various providers.   I called his pharmacy and clarified his medication. 1. Colchicine 0.6 mg po QOD--new by Dr. Trudie Reed 2. Uloric 40 mg po daily --new by Dr. Trudie Reed 3. Coreg 12.5 mg po bid--new by Dr.  Clover Mealy 4. Lisinopril 20 mg QD --increased by Dr. Clover Mealy 5. Pravastatin--same 6. Amlodipine--D/C'd by Dr. Clover Mealy 7. Allopurinol---D/C'd by Dr. Trudie Reed.   Past Medical History  Diagnosis Date  . 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 Hypertension   . Seizures     last seizure was >5 years ago, pt has family history of seizures  . Syphilis 1997    history of syphilis 1997  . Chronic kidney disease     stage !! CKD, followed by Fr. Clover Mealy  . 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 Rib fractures 01/2009  . HIV infection 1980's    on ART therapy since, followed by ID clinic, complicated  by neuropathy  . 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 carvedilol (COREG) 12.5 MG tablet Take 12.5 mg by mouth 2 (two) times daily with a meal.      . colchicine 0.6 MG tablet Take 0.6 mg by mouth every other day.       . febuxostat (ULORIC) 40 MG tablet Take 40 mg by mouth daily.      . ISENTRESS 400 MG tablet TAKE ONE TABLET TWICE DAILY  60 tablet  6  .  lamiVUDine-zidovudine (COMBIVIR) 150-300 MG per tablet Take 1 tablet by mouth 2 (two) times daily.  60 tablet  3  . lisinopril (PRINIVIL,ZESTRIL) 10 MG tablet Take 20 mg by mouth daily.      Marland Kitchen lopinavir-ritonavir (KALETRA) 200-50 MG per tablet Take 2 tablets by mouth 2 (two) times daily.  120 tablet  3  . pravastatin (PRAVACHOL) 40 MG tablet Take 1 tablet (40 mg total) by mouth daily.  30 tablet  5  . HYDROcodone-acetaminophen (NORCO/VICODIN) 5-325 MG per tablet Take 1 tablet by mouth every 6 (six) hours as needed for pain.  10 tablet  0   No current facility-administered medications for this visit.   Family History  Problem Relation Age of Onset  . Cancer Mother   . COPD Father   .  Diabetes Sister   . Hypertension Sister   . Diabetes Brother   . Hypertension Brother   . Stroke Neg Hx    History   Social History  . Marital Status: Married    Spouse Name: N/A    Number of Children: N/A  . Years of Education: N/A   Social History Main Topics  . Smoking status: Current Every Day Smoker -- 0.30 packs/day for 20 years    Types: Cigarettes  . Smokeless tobacco: Never Used  . Alcohol Use: Yes     Comment: occasional  . Drug Use: No  . Sexually Active: Yes     Comment: pt. given condoms   Other Topics Concern  . None   Social History Narrative  . None   Review of Systems: See HPI  Objective:  Physical Exam: Filed Vitals:   01/04/13 1458 01/04/13 1625  BP: 181/99 180/101  Pulse: 103 96  Temp: 98.3 F (36.8 C)   TempSrc: Oral   Height: 5' 10.5" (1.791 m)   Weight: 253 lb 3.2 oz (114.851 kg)   SpO2: 98%        Assessment & Plan:   General: mild distress due to pain  Head: normocephalic and atraumatic.  Eyes: vision grossly intact, pupils equal, pupils round, pupils reactive to light, no injection and anicteric.  Mouth: pharynx pink and moist, no erythema, and no exudates.  Neck: supple, full ROM, no thyromegaly, no JVD, and no carotid bruits.  Lungs: normal respiratory effort, no accessory muscle use, normal breath sounds, no crackles, and no wheezes. Heart: normal rate, regular rhythm, no murmur, no gallop, and no rub.  Abdomen: soft, non-tender, normal bowel sounds, no distention, no guarding, no rebound tenderness, no hepatomegaly, and no splenomegaly.  Msk: B/L joint swelling left worse than right, mild tenderness with palpation. No redness over joints noted likely due to his dark skin color. No warmth to touch.  Pulses: 2+ DP/PT pulses bilaterally Extremities: No cyanosis, clubbing, edema Neurologic: alert & oriented X3, cranial nerves II-XII intact, strength normal in all extremities, sensation intact to light touch, and gait normal.   Skin: turgor normal and no rashes.  Psych: Oriented X3, memory intact for recent and remote, normally interactive, good eye contact, not anxious appearing, and not depressed appearing.

## 2013-01-04 NOTE — Assessment & Plan Note (Addendum)
Patient has a chronic history of Gout and was just started on Prophylaxis treatment of Uloric and Colchicine by Dr. Trudie Reed a few weeks ago.  He presents with right knee pain and swelling x 10 days, which became better with prednisone tapering tx; and left knee pain/swellin x 1 day(while he is on prednisone treatment).  Denies trauma or injury.  Denies fever, chills or other systemic symptoms.  Physical examination reveals biteral knees swelling and tenderness to palpation with left side worse than right side.  The differential diagnosis includes gout, pseudogout, osteoarthritis and septic arthritis.  Osteoarthritis is unlikely since it doesn't usually present with acute onset pain and swelling without trauma or injury.  And he is at fairly young age for severe osteoarthritis causing this much pain and swelling. Septic arthritis is less likely since he doesn't have fever, chills or any other systemic symptoms in the setting of bilateral knees swelling and severe pain.  The clinical manifestations is more consistent with gout.  His symptoms that started after consumption of high purine food including steak and alcohol certainly increase the pretest probability of the diagnosis.  However, the fact that he still developed left knee pain while on prednisone therapy warrants a necessary arthrocentesis for clarification of diagnosis.  Of note, He is on fairly good medical therapy for gout prophylaxis and acute gout treatment.   - Left knee arthrocentesis  PROCEDURE OPERATOR: Dr. Charlann Lange and Dr. Gilles Chiquito  CONSENT: Consent was obtained from the patient prior to the procedure. Indications, risks,  and benefits were explained at length.   PROCEDURE SUMMARY:  A time out was performed. The patient was prepped in a sterile manner using Betadine  after the appropriate site was palpated and confirmed. 1% lidocaine was used to numb the region.  A 22-gauge, 2-inch needle was required to actually locate fluid.  Fluid was aspirated on the first attempt A total of 10 mL clear yellow fluid removed. No immediate complications were noted during the procedure.  Dr. Gilles Chiquito was present during the entire procedure. The fluid will be sent for several studies.  ESTIMATED BLOOD LOSS: 0 ml.   - Synovial fluid sent for cell count, crystal and culture - If he indeed has gout flareup,  His treatment options are limited.  No NSAIDs recommended due to hx of CKD stage III; no colchicine recommended since he is on colchicine prophylaxis treatment; no oral prednisone recommended since he had symptoms while on it;  - The treatment options will be bilateral knee steroids injections.  - Patient states that he is unable to come back to the clinic during office hours for steroids injections for the rest of week due to transportation problems ( his wife works and can only drive him in the evening time).  - Patient is recommended to go to urgent care or ED for steroids injections if Gout is confirmed. - I will call patient with results tomorrow. - will also update Dr. Trudie Reed' office. Patient has an appt with her in 2 weeks.     With regards to pain control, he was not consistent with his pain contract per his PCP. I will only give him hydrocodone 5/325 1 Q6 prn  #10 for now. No refills. Patient is encouraged to se tup another appt for pain contract discussion once his acute problem is resolved.

## 2013-01-05 ENCOUNTER — Telehealth: Payer: Self-pay | Admitting: Internal Medicine

## 2013-01-05 LAB — BODY FLUID CELL COUNT WITH DIFFERENTIAL
Eos, Fluid: 0 %
Lymphs, Fluid: 43 %
Neutrophil Count, Fluid: 11 % (ref 0–25)
Total Nucleated Cell Count, Fluid: 590 cu mm (ref 0–1000)

## 2013-01-05 LAB — SYNOVIAL FLUID, CRYSTAL

## 2013-01-05 NOTE — Progress Notes (Signed)
Office notes and labs from 01/04/13 faxed to Dr Trudie Reed per Dr Nicoletta Dress. Hilda Blades Judieth Mckown RN 01/05/13 4PM

## 2013-01-05 NOTE — Telephone Encounter (Signed)
  INTERNAL MEDICINE RESIDENCY PROGRAM After-Hours Telephone Call    Reason for call:   I placed an outgoing call to [name] at 1500 pm. Patient was not available. I left a phone message for him to call back to the clinic at [phone] before 5 pm today. I also instructed him to go to the urgent care for gout treatment if he could not call me back before 5 pm today. The urgent care physician can access the medical records and lab results.      Last encounter / Pertinent Data:  His synovial fluid crystal study showed Monosodium Urate crystals on 01/04/13.     Charlann Lange, MD   01/05/2013, 3:09 PM

## 2013-01-05 NOTE — Progress Notes (Signed)
Case discussed with Dr. Nicoletta Dress immediately after the resident saw the patient. We reviewed the resident's history and exam and pertinent patient test results. I agree with the assessment, diagnosis and plan of care documented in the resident's note. Arthrocentesis was observed by Dr Daryll Drown.

## 2013-01-06 ENCOUNTER — Encounter: Payer: Self-pay | Admitting: Internal Medicine

## 2013-01-06 ENCOUNTER — Ambulatory Visit (INDEPENDENT_AMBULATORY_CARE_PROVIDER_SITE_OTHER): Payer: Medicare Other | Admitting: Internal Medicine

## 2013-01-06 VITALS — BP 159/87 | HR 99 | Temp 98.6°F | Wt 257.0 lb

## 2013-01-06 DIAGNOSIS — N179 Acute kidney failure, unspecified: Secondary | ICD-10-CM | POA: Diagnosis not present

## 2013-01-06 DIAGNOSIS — M109 Gout, unspecified: Secondary | ICD-10-CM

## 2013-01-06 DIAGNOSIS — B20 Human immunodeficiency virus [HIV] disease: Secondary | ICD-10-CM | POA: Diagnosis not present

## 2013-01-06 LAB — BASIC METABOLIC PANEL WITH GFR
BUN: 26 mg/dL — ABNORMAL HIGH (ref 6–23)
Chloride: 102 mEq/L (ref 96–112)
Creat: 2.96 mg/dL — ABNORMAL HIGH (ref 0.50–1.35)
GFR, Est Non African American: 24 mL/min — ABNORMAL LOW

## 2013-01-06 MED ORDER — HYDROCODONE-ACETAMINOPHEN 10-325 MG PO TABS: 1.0000 | ORAL_TABLET | Freq: Four times a day (QID) | ORAL | Status: DC | PRN

## 2013-01-06 MED ORDER — RITONAVIR 100 MG PO TABS: 100.0000 mg | ORAL_TABLET | Freq: Every day | ORAL | Status: DC

## 2013-01-06 MED ORDER — ZIDOVUDINE 300 MG PO TABS: 300.0000 mg | ORAL_TABLET | Freq: Two times a day (BID) | ORAL | Status: DC

## 2013-01-06 MED ORDER — LAMIVUDINE 100 MG PO TABS: 100.0000 mg | ORAL_TABLET | Freq: Every day | ORAL | Status: DC

## 2013-01-06 MED ORDER — DARUNAVIR ETHANOLATE 800 MG PO TABS: 800.0000 mg | ORAL_TABLET | Freq: Every day | ORAL | Status: DC

## 2013-01-06 NOTE — Progress Notes (Signed)
[facility] NOTE  RFV: routine Subjective:    Patient ID: Darrell Johnson, male    DOB: 11-05-1965, 47 y.o.   MRN: [id number]  HPI 47yo Male with HIV, CD 4 count of 640/VL<20,on ral/kaletra/combivir. He is noted to have  Worsening aki, receiving colchicine for gouty flare. He is recently recovering from gouty flare. Not established with nephrology for kidney disease.  No fever, chills, nightsweats.  Current Outpatient Prescriptions on File Prior to Visit  Medication Sig Dispense Refill  . colchicine 0.6 MG tablet Take 0.6 mg by mouth every other day.       . febuxostat (ULORIC) 40 MG tablet Take 40 mg by mouth daily.      Marland Kitchen HYDROcodone-acetaminophen (NORCO/VICODIN) 5-325 MG per tablet Take 1 tablet by mouth every 6 (six) hours as needed for pain.  10 tablet  0  . ISENTRESS 400 MG tablet TAKE ONE TABLET TWICE DAILY  60 tablet  6  . lisinopril (PRINIVIL,ZESTRIL) 10 MG tablet Take 20 mg by mouth daily.      . pravastatin (PRAVACHOL) 40 MG tablet Take 1 tablet (40 mg total) by mouth daily.  30 tablet  5  . carvedilol (COREG) 12.5 MG tablet Take 12.5 mg by mouth 2 (two) times daily with a meal.       No current facility-administered medications on file prior to visit.   Active Ambulatory Problems    Diagnosis Date Noted  . HIV DISEASE 05/08/2006  . HYPERTRIGLYCERIDEMIA 11/01/2009  . Gout, unspecified 08/13/2009  . 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 ERECTILE DYSFUNCTION 06/08/2008  . PERIPHERAL NEUROPATHY 05/08/2006  . HYPERTENSION 05/08/2006  . SEIZURE DISORDER 05/08/2006  . ALKALINE PHOSPHATASE, ELEVATED 11/01/2009  . Leg swelling 08/27/2012  . CKD (chronic kidney disease) stage 3, GFR 30-59 ml/min 09/17/2012   Resolved Ambulatory Problems    Diagnosis Date Noted  . PULMONARY TUBERCULOSIS 05/08/2006  . CONDYLOMA ACUMINATA, PENIS 08/06/2006  . SYPHILIS NOS 05/08/2006  . ONYCHOMYCOSIS, TOENAILS 06/08/2008  . KIDNEY DISEASE, CHRONIC, STAGE II 09/27/2006  . ARTHRITIS, RIGHT  SHOULDER 05/08/2006  . KNEE PAIN, BILATERAL 10/06/2008  . Dysuria 10/06/2008  . FRACTURE, RIBS, MULTIPLE 01/25/2009  . Sinusitis 03/04/2011  . Allergic rhinitis 03/04/2011  . Tinea capitis 03/04/2011  . Tachycardia 09/22/2011  . Boil, groin 02/03/2012  . Sinusitis 05/13/2012   Past Medical History  Diagnosis Date  . Hyperlipidemia   . Hypertension   . Seizures   . Syphilis 1997  . Chronic kidney disease   . Sexually transmitted disease   . Rib fractures 01/2009  . HIV infection 1980's  . Male circumcision 11/2005      Review of Systems 12 point ROS is negative, other than what is mentioned in HPI    Objective:   Physical Exam BP 159/87  Pulse 99  Temp(Src) 98.6 F (37 C) (Oral)  Wt 257 lb (116.574 kg)  BMI 36.34 kg/m2 Physical Exam  Constitutional: He is oriented to person, place, and time. He appears well-developed and well-nourished. No distress.  HENT:  Mouth/Throat: Oropharynx is clear and moist. No oropharyngeal exudate.  Cardiovascular: Normal rate, regular rhythm and normal heart sounds. Exam reveals no gallop and no friction rub.  No murmur heard.  Pulmonary/Chest: Effort normal and breath sounds normal. No respiratory distress. He has no wheezes.  Abdominal: Soft. Bowel sounds are normal. He exhibits no distension. There is no tenderness.  Lymphadenopathy:  He has no cervical adenopathy.  Neurological: He is alert and oriented to person, place,  and time.  Skin: Skin is warm and dry. No rash noted. No erythema.  Psychiatric: He has a normal mood and affect. His behavior is normal.   Labs BMET    Component Value Date/Time   NA 138 01/06/2013 1129   K 3.6 01/06/2013 1129   CL 102 01/06/2013 1129   CO2 29 01/06/2013 1129   GLUCOSE 84 01/06/2013 1129   BUN 26* 01/06/2013 1129   CREATININE 2.96* 01/06/2013 1129   CREATININE 1.68* 10/23/2009 2039   CALCIUM 8.6 01/06/2013 1129         Assessment & Plan:   hiv =will change his hiv meds to adjust for CrCl to  ral BID, DRVr daily, zidovudine 300 BID, lamivudine 100mg  daily  Gout = gave rx pain med  Acute on chronic kidney injury = will refer to nephrology

## 2013-01-07 ENCOUNTER — Other Ambulatory Visit: Payer: Self-pay | Admitting: *Deleted

## 2013-01-07 DIAGNOSIS — M25569 Pain in unspecified knee: Secondary | ICD-10-CM | POA: Diagnosis not present

## 2013-01-07 DIAGNOSIS — M109 Gout, unspecified: Secondary | ICD-10-CM | POA: Diagnosis not present

## 2013-01-07 DIAGNOSIS — B20 Human immunodeficiency virus [HIV] disease: Secondary | ICD-10-CM

## 2013-01-07 MED ORDER — LAMIVUDINE 100 MG PO TABS: 100.0000 mg | ORAL_TABLET | Freq: Every day | ORAL | Status: DC

## 2013-01-07 MED ORDER — DARUNAVIR ETHANOLATE 800 MG PO TABS: 800.0000 mg | ORAL_TABLET | Freq: Every day | ORAL | Status: DC

## 2013-01-07 MED ORDER — ZIDOVUDINE 300 MG PO TABS: 300.0000 mg | ORAL_TABLET | Freq: Two times a day (BID) | ORAL | Status: DC

## 2013-01-08 LAB — BODY FLUID CULTURE
Gram Stain: NONE SEEN
Organism ID, Bacteria: NO GROWTH

## 2013-02-17 DIAGNOSIS — I10 Essential (primary) hypertension: Secondary | ICD-10-CM | POA: Diagnosis not present

## 2013-02-17 DIAGNOSIS — N2581 Secondary hyperparathyroidism of renal origin: Secondary | ICD-10-CM | POA: Diagnosis not present

## 2013-02-17 DIAGNOSIS — B20 Human immunodeficiency virus [HIV] disease: Secondary | ICD-10-CM | POA: Diagnosis not present

## 2013-02-17 DIAGNOSIS — D649 Anemia, unspecified: Secondary | ICD-10-CM | POA: Diagnosis not present

## 2013-02-17 DIAGNOSIS — I129 Hypertensive chronic kidney disease with stage 1 through stage 4 chronic kidney disease, or unspecified chronic kidney disease: Secondary | ICD-10-CM | POA: Diagnosis not present

## 2013-03-16 DIAGNOSIS — Z79899 Other long term (current) drug therapy: Secondary | ICD-10-CM | POA: Diagnosis not present

## 2013-03-16 DIAGNOSIS — M2550 Pain in unspecified joint: Secondary | ICD-10-CM | POA: Diagnosis not present

## 2013-03-16 DIAGNOSIS — M109 Gout, unspecified: Secondary | ICD-10-CM | POA: Diagnosis not present

## 2013-04-04 ENCOUNTER — Other Ambulatory Visit: Payer: Medicare Other

## 2013-04-04 DIAGNOSIS — B20 Human immunodeficiency virus [HIV] disease: Secondary | ICD-10-CM

## 2013-04-04 LAB — CBC WITH DIFFERENTIAL/PLATELET
Eosinophils Absolute: 0.2 10*3/uL (ref 0.0–0.7)
Eosinophils Relative: 3 % (ref 0–5)
Hemoglobin: 13.3 g/dL (ref 13.0–17.0)
Lymphs Abs: 2 10*3/uL (ref 0.7–4.0)
MCH: 34.4 pg — ABNORMAL HIGH (ref 26.0–34.0)
MCV: 97.2 fL (ref 78.0–100.0)
Monocytes Absolute: 0.5 10*3/uL (ref 0.1–1.0)
Monocytes Relative: 7 % (ref 3–12)
Platelets: 228 10*3/uL (ref 150–400)
RBC: 3.87 MIL/uL — ABNORMAL LOW (ref 4.22–5.81)

## 2013-04-05 LAB — COMPLETE METABOLIC PANEL WITH GFR
CO2: 26 mEq/L (ref 19–32)
Creat: 3.13 mg/dL — ABNORMAL HIGH (ref 0.50–1.35)
GFR, Est African American: 26 mL/min — ABNORMAL LOW
GFR, Est Non African American: 22 mL/min — ABNORMAL LOW
Glucose, Bld: 104 mg/dL — ABNORMAL HIGH (ref 70–99)
Total Bilirubin: 0.3 mg/dL (ref 0.3–1.2)

## 2013-04-05 LAB — T-HELPER CELL (CD4) - (RCID CLINIC ONLY): CD4 % Helper T Cell: 32 % — ABNORMAL LOW (ref 33–55)

## 2013-04-13 ENCOUNTER — Other Ambulatory Visit: Payer: Self-pay | Admitting: *Deleted

## 2013-04-13 MED ORDER — CARVEDILOL 12.5 MG PO TABS: 12.5000 mg | ORAL_TABLET | Freq: Two times a day (BID) | ORAL | Status: DC

## 2013-04-18 ENCOUNTER — Encounter: Payer: Self-pay | Admitting: Internal Medicine

## 2013-04-18 ENCOUNTER — Ambulatory Visit (INDEPENDENT_AMBULATORY_CARE_PROVIDER_SITE_OTHER): Payer: Medicaid Other | Admitting: Internal Medicine

## 2013-04-18 VITALS — BP 149/90 | HR 84 | Temp 98.8°F | Wt 250.0 lb

## 2013-04-18 DIAGNOSIS — M109 Gout, unspecified: Secondary | ICD-10-CM

## 2013-04-18 DIAGNOSIS — I10 Essential (primary) hypertension: Secondary | ICD-10-CM | POA: Diagnosis not present

## 2013-04-18 DIAGNOSIS — Z23 Encounter for immunization: Secondary | ICD-10-CM

## 2013-04-18 MED ORDER — HYDROCODONE-ACETAMINOPHEN 10-325 MG PO TABS: 1.0000 | ORAL_TABLET | Freq: Three times a day (TID) | ORAL | Status: DC | PRN

## 2013-04-18 MED ORDER — HYDROCODONE-ACETAMINOPHEN 10-325 MG PO TABS: 1.0000 | ORAL_TABLET | Freq: Four times a day (QID) | ORAL | Status: DC | PRN

## 2013-04-18 NOTE — Progress Notes (Signed)
[facility] NOTE  RFV: routine  Subjective:    Patient ID: Darrell Johnson, male    DOB: 07-Oct-1965, 47 y.o.   MRN: [id number]  HPI 47yo Male with HIV and CKD, CD 4 count 600/VL<20 (sep 2014)ral BID, DRVr daily, zidovudine 300 BID, lamivudine 100mg  daily. Ran out of norvir 2 days ago. Complaining that pharmacy is not synchronizing all of his hiv meds. And getting 2 isentress a month, and still getting [place]  Increasing stressor today. Headache. No other health complaints  Current Outpatient Prescriptions on File Prior to Visit  Medication Sig Dispense Refill  . carvedilol (COREG) 12.5 MG tablet Take 1 tablet (12.5 mg total) by mouth 2 (two) times daily with a meal.  60 tablet  11  . colchicine 0.6 MG tablet Take 0.6 mg by mouth every other day.       . Darunavir Ethanolate (PREZISTA) 800 MG tablet Take 1 tablet (800 mg total) by mouth daily with breakfast.  30 tablet  11  . febuxostat (ULORIC) 40 MG tablet Take 40 mg by mouth daily.      Marland Kitchen HYDROcodone-acetaminophen (NORCO) 10-325 MG per tablet Take 1 tablet by mouth every 6 (six) hours as needed for pain.  30 tablet  0  . HYDROcodone-acetaminophen (NORCO/VICODIN) 5-325 MG per tablet Take 1 tablet by mouth every 6 (six) hours as needed for pain.  10 tablet  0  . ISENTRESS 400 MG tablet TAKE ONE TABLET TWICE DAILY  60 tablet  6  . lamivudine (EPIVIR) 100 MG tablet Take 1 tablet (100 mg total) by mouth daily.  30 tablet  11  . lisinopril (PRINIVIL,ZESTRIL) 10 MG tablet Take 20 mg by mouth daily.      . pravastatin (PRAVACHOL) 40 MG tablet Take 1 tablet (40 mg total) by mouth daily.  30 tablet  5  . ritonavir (NORVIR) 100 MG TABS Take 1 tablet (100 mg total) by mouth daily with breakfast.  30 tablet  11  . zidovudine (RETROVIR) 300 MG tablet Take 1 tablet (300 mg total) by mouth 2 (two) times daily.  60 tablet  11   No current facility-administered medications on file prior to visit.   Active Ambulatory Problems    Diagnosis Date Noted   . HIV DISEASE 05/08/2006  . HYPERTRIGLYCERIDEMIA 11/01/2009  . Gout, unspecified 08/13/2009  . 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 ERECTILE DYSFUNCTION 06/08/2008  . PERIPHERAL NEUROPATHY 05/08/2006  . HYPERTENSION 05/08/2006  . SEIZURE DISORDER 05/08/2006  . ALKALINE PHOSPHATASE, ELEVATED 11/01/2009  . Leg swelling 08/27/2012  . CKD (chronic kidney disease) stage 3, GFR 30-59 ml/min 09/17/2012   Resolved Ambulatory Problems    Diagnosis Date Noted  . PULMONARY TUBERCULOSIS 05/08/2006  . CONDYLOMA ACUMINATA, PENIS 08/06/2006  . SYPHILIS NOS 05/08/2006  . ONYCHOMYCOSIS, TOENAILS 06/08/2008  . KIDNEY DISEASE, CHRONIC, STAGE II 09/27/2006  . ARTHRITIS, RIGHT SHOULDER 05/08/2006  . KNEE PAIN, BILATERAL 10/06/2008  . Dysuria 10/06/2008  . FRACTURE, RIBS, MULTIPLE 01/25/2009  . Sinusitis 03/04/2011  . Allergic rhinitis 03/04/2011  . Tinea capitis 03/04/2011  . Tachycardia 09/22/2011  . Boil, groin 02/03/2012  . Sinusitis 05/13/2012   Past Medical History  Diagnosis Date  . Hyperlipidemia   . Hypertension   . Seizures   . Syphilis 1997  . Chronic kidney disease   . Sexually transmitted disease   . Rib fractures 01/2009  . HIV infection 1980's  . Male circumcision 11/2005        Review of Systems  Objective:   Physical Exam BP 201/114  Pulse 84  Temp(Src) 98.8 F (37.1 C) (Oral)  Wt 250 lb (113.399 kg)  BMI 35.35 kg/m2 Physical Exam  Constitutional: He is oriented to person, place, and time. He appears well-developed and well-nourished. No distress.  HENT:  Mouth/Throat: Oropharynx is clear and moist. No oropharyngeal exudate.  Cardiovascular: Normal rate, regular rhythm and normal heart sounds. Exam reveals no gallop and no friction rub.  No murmur heard.  Pulmonary/Chest: Effort normal and breath sounds normal. No respiratory distress. He has no wheezes.  Abdominal: Soft. Bowel sounds are normal. He exhibits no distension. There is no tenderness.   Lymphadenopathy:  He has no cervical adenopathy.  Neurological: He is alert and oriented to person, place, and time.  Skin: Skin is warm and dry. No rash noted. No erythema.  Psychiatric: He has a normal mood and affect. His behavior is normal.          Assessment & Plan:  HIV = doing well with his medications; will need to call pharmacy to ensure he is no longer receiving kaletra  Uncontrolled high blood pressure  = concern that he has hypertensive urgency since he is complaining of headache. Will give clonidine 0.2mg  at this visit. Asked him to take his BP meds at home as well as  need to PCP next week. If still has headache, will need to go to [facility] or ED for evaluation  ckd = sees dr. Moshe Cipro in November  oa = seeing his PCP for arthritis in a few weeks, give refill on pain meds  Health maintenance = will give flu shot today

## 2013-04-19 MED ORDER — CLONIDINE HCL 0.1 MG PO TABS
0.2000 mg | ORAL_TABLET | Freq: Once | ORAL | Status: AC
  Administered 2013-04-18: 0.2 mg via ORAL

## 2013-04-27 ENCOUNTER — Encounter: Payer: Self-pay | Admitting: Internal Medicine

## 2013-04-27 ENCOUNTER — Other Ambulatory Visit: Payer: Self-pay | Admitting: Internal Medicine

## 2013-04-27 ENCOUNTER — Ambulatory Visit (INDEPENDENT_AMBULATORY_CARE_PROVIDER_SITE_OTHER): Payer: Medicare Other | Admitting: Internal Medicine

## 2013-04-27 VITALS — BP 160/100 | HR 84 | Temp 97.2°F | Ht 70.5 in | Wt 253.8 lb

## 2013-04-27 DIAGNOSIS — I10 Essential (primary) hypertension: Secondary | ICD-10-CM

## 2013-04-27 DIAGNOSIS — B20 Human immunodeficiency virus [HIV] disease: Secondary | ICD-10-CM

## 2013-04-27 DIAGNOSIS — G894 Chronic pain syndrome: Secondary | ICD-10-CM

## 2013-04-27 DIAGNOSIS — N183 Chronic kidney disease, stage 3 unspecified: Secondary | ICD-10-CM

## 2013-04-27 DIAGNOSIS — M109 Gout, unspecified: Secondary | ICD-10-CM

## 2013-04-27 LAB — URIC ACID: Uric Acid, Serum: 5.5 mg/dL (ref 4.0–7.8)

## 2013-04-27 MED ORDER — LISINOPRIL 40 MG PO TABS: 40.0000 mg | ORAL_TABLET | Freq: Every day | ORAL | Status: DC

## 2013-04-27 MED ORDER — HYDROCODONE-ACETAMINOPHEN 10-325 MG PO TABS: 1.0000 | ORAL_TABLET | Freq: Three times a day (TID) | ORAL | Status: DC | PRN

## 2013-04-27 NOTE — Progress Notes (Signed)
Subjective:    Patient ID: Darrell Johnson, male    DOB: [birth year], 47 y.o.   MRN: [id number]  HPI  [name] is a 47 y.o. man with PMH of gout (bilateral knees), HTN, HLD, CKD, history of syphilis in 1997, HIV on HAART (CD4 600, VL<20 in 03/2013), who presents to the clinic for routine follow up.   Chronic pain - Patient is on a pain contract with [place] for Norco 10-325 q8hr prn osteoarthritis and gout pain in bilateral knees #90. He violated our contract in June when he filled prescriptions for narcotics from other providers and multiple pharmacies. I will have him resign the contract with me today since this is the first time I've met him.   Gout - On uloric and colchicine. Most recent flare was in June, knee was tapped in our clinic and synovial fluid study showed monosodium urate crystals. 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 He was seen in August by Dr. Trudie Reed (rheumatology) who recommended checking a uric acid level, and if >6, increasing Uloric to 80mg  daily. We will check this today.  HIV - Follows with [name], who saw him on 9/29. He is doing well with his HAART medications. He got a flu shot in her office.   HTN - [name] was concerned about possible hypertensive encephalopthy at his last ID clinic visit, since he was complaining of headache and SBP was elevated. He was given clonidine 0.2mg  at that time. In terms of home medications, he takes Coreg 12.5 bid, Lisinopril 20mg  daily, amlodipine 10mg  daily, Lasix 40 mg daily. He reports good compliance with these. He said he normally gets a headache after work. 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 He denies a headache currently, however. Denies visual changes, dizziness, lightheadedness.  CKD - He will seed Dr. Moshe Cipro in November.     Home  Medication List prior to encounter              amLODipine 10 MG tablet  Commonly known as:  NORVASC  Take 10 mg by mouth daily.     carvedilol 12.5 MG tablet  Commonly known as:  COREG  Take 1 tablet (12.5 mg total) by mouth 2 (two) times daily with a meal.     colchicine 0.6 MG tablet  Take 0.6 mg by mouth every other day.     Darunavir Ethanolate 800 MG tablet  Commonly known as:  PREZISTA  Take 1 tablet (800 mg total) by mouth daily with breakfast.     febuxostat 40 MG tablet  Commonly known as:  ULORIC  Take 40 mg by mouth daily.     furosemide 40 MG tablet  Commonly known as:  LASIX  Take 40 mg by mouth daily.     HYDROcodone-acetaminophen 10-325 MG per tablet  Commonly known as:  NORCO  Take 1 tablet by mouth every 8 (eight) hours as needed for pain.     ISENTRESS 400 MG tablet  Generic drug:  raltegravir  TAKE ONE TABLET TWICE DAILY     lamivudine 100 MG tablet  Commonly known as:  EPIVIR  Take 1 tablet (100 mg total) by mouth daily.     lisinopril 20 MG tablet  Commonly known as:  PRINIVIL,ZESTRIL  Take 1 tablet (20 mg total) by mouth  daily.     pravastatin 40 MG tablet  Commonly known as:  PRAVACHOL  Take 1 tablet (40 mg total) by mouth daily.     ritonavir 100 MG Tabs tablet  Commonly known as:  NORVIR  Take 1 tablet (100 mg total) by mouth daily with breakfast.     zidovudine 300 MG tablet  Commonly known as:  RETROVIR  Take 1 tablet (300 mg total) by mouth 2 (two) times daily.        Review of Systems  Constitutional: Negative for fever and chills.  HENT: Negative for postnasal drip and rhinorrhea.   Eyes: Negative for visual disturbance.  Respiratory: Negative for shortness of breath.   Cardiovascular: Negative for chest pain.  Gastrointestinal: Negative for nausea, vomiting, abdominal pain, diarrhea and constipation.  Genitourinary: Negative for dysuria and difficulty urinating.  Musculoskeletal: Negative for joint swelling.  Skin:  Negative for rash.  Neurological: Negative for dizziness, weakness, numbness and headaches.       Objective:   Physical Exam  Constitutional: He is oriented to person, place, and time. He appears well-developed and well-nourished.  HENT:  Head: Normocephalic and atraumatic.  Eyes: Conjunctivae and EOM are normal. Pupils are equal, round, and reactive to light.  Neck: Normal range of motion. Neck supple.  Cardiovascular: Normal rate and regular rhythm.  Exam reveals no gallop and no friction rub.   No murmur heard. Pulmonary/Chest: Effort normal and breath sounds normal.  Abdominal: Soft. There is no tenderness.  Musculoskeletal: Normal range of motion. He exhibits no edema and no tenderness.  Bilateral knees without warmth, swelling, erythema, or pain.  Neurological: He is alert and oriented to person, place, and time. No cranial nerve deficit.  Skin: Skin is warm and dry.  Psychiatric: He has a normal mood and affect.          Assessment & Plan:

## 2013-04-27 NOTE — Patient Instructions (Signed)
Thank you for your visit. - Your blood pressure was elevated today. I have increased the dose of your lisinopril to 40 mg daily. A prescription has been sent to your pharmacy, [facility] on [address]. - I will call them to see if we can coordinate your prescriptions to be refilled on the same day each month. - Regarding your Norco prescriptions, I have given you one month's supply today. You will have to stop by the clinic to pick your refill prescriptions on November 8, and December 8. - I have given you a copy of your pain contract for you to review. Please read over these terms again, and call the clinic if you have any questions. - We are checking your uric acid level today to see if we need to increase the dose of your gout medication. I will call you if we need to increase the dose. - Please return to see me in 3 months.

## 2013-04-28 ENCOUNTER — Telehealth: Payer: Self-pay | Admitting: Internal Medicine

## 2013-04-28 DIAGNOSIS — G894 Chronic pain syndrome: Secondary | ICD-10-CM | POA: Insufficient documentation

## 2013-04-28 NOTE — Assessment & Plan Note (Signed)
Pain contract resigned today. I reviewed all of the terms with him in detail, including getting narcotic prescriptions only from our clinic and promising to use only one pharmacy. Three month's worth of prescriptions were printed, he was given October and I gave Nov/Dec to the refill nurse for him to pick up later. Patient was given a copy of his contract. FYI updated.

## 2013-04-28 NOTE — Assessment & Plan Note (Addendum)
Stable. NO acute exacerbation. Knees are not warm, swollen, painful, or erythematous today, - Uric acid level <6 so we will continue Uloric at current dose (40mg  daily).  Uric Acid, Serum  Date Value Range Status  04/27/2013 5.5  4.0 - 7.8 mg/dL Final

## 2013-04-28 NOTE — Telephone Encounter (Signed)
  Reason for call:   I placed an outgoing call to [name] at 10:14  AM regarding coordinating the refill times for his multiple medications.   Assessment/ Plan:   Unfortunately insurance will not allow him to fill a prescription weeks in advance in order to sync his refill dates.  He can request refills a few days early, and over time move his refill dates a little closer together.  He will need to talk to his pharmacist about doing this.  As always, pt is advised that if symptoms worsen or new symptoms arise, they should go to an urgent care facility or to to ER for further evaluation.   Lesly Dukes, MD   04/28/2013, 10:14 AM

## 2013-04-28 NOTE — Assessment & Plan Note (Addendum)
BP Readings from Last 3 Encounters:  04/27/13 160/100  04/18/13 149/90  01/06/13 159/87    Lab Results  Component Value Date   NA 140 04/04/2013   K 3.7 04/04/2013   CREATININE 3.13* 04/04/2013    Assessment: Blood pressure control: mildly elevated Progress toward BP goal:  unchanged  Plan: Medications:  Increasing Lisinopril to 40mg  daily Educational resources provided: brochure Other plans: At home he takes Coreg 12.5 bid, Lisinopril 20mg  daily, amlodipine 10mg  daily, Lasix 40 mg daily. We have room to move on the ACE-I and he has CKD, so I will increase his Lisinopril to 40 daily. K was wnl several weeks ago. If not well controlled at his next follow up visit, my next move will be to increase his Coreg.

## 2013-04-28 NOTE — Assessment & Plan Note (Addendum)
Increasing ACE-I as above. He will see Dr. Moshe Cipro in November.

## 2013-04-28 NOTE — Assessment & Plan Note (Signed)
Sees [name]. - Continue HAART medications - Patient expressed some frustration over his medications not being "sync'ed" at the pharmacy, necessitating him to have to go there almost every week. I will call over and see if we can't change the refill dates to the same time of the month.

## 2013-05-02 NOTE — Progress Notes (Signed)
I saw and evaluated the patient.  I personally confirmed the key portions of Dr. Leroy Kennedy history and exam and reviewed pertinent patient test results.  The assessment, diagnosis, and plan were formulated together and I agree with the documentation in the resident's note.

## 2013-05-16 DIAGNOSIS — M2550 Pain in unspecified joint: Secondary | ICD-10-CM | POA: Diagnosis not present

## 2013-05-16 DIAGNOSIS — M109 Gout, unspecified: Secondary | ICD-10-CM | POA: Diagnosis not present

## 2013-05-16 DIAGNOSIS — Z79899 Other long term (current) drug therapy: Secondary | ICD-10-CM | POA: Diagnosis not present

## 2013-05-23 DIAGNOSIS — M109 Gout, unspecified: Secondary | ICD-10-CM | POA: Diagnosis not present

## 2013-06-10 ENCOUNTER — Inpatient Hospital Stay (HOSPITAL_COMMUNITY)
Admission: EM | Admit: 2013-06-10 | Discharge: 2013-06-12 | DRG: 682 | Disposition: A | Discharge status: Home / Self Care | Payer: Medicare Other | Attending: Internal Medicine | Admitting: Internal Medicine

## 2013-06-10 ENCOUNTER — Emergency Department (HOSPITAL_COMMUNITY): Payer: Medicare Other

## 2013-06-10 DIAGNOSIS — E872 Acidosis, unspecified: Secondary | ICD-10-CM | POA: Diagnosis present

## 2013-06-10 DIAGNOSIS — I169 Hypertensive crisis, unspecified: Secondary | ICD-10-CM

## 2013-06-10 DIAGNOSIS — N179 Acute kidney failure, unspecified: Secondary | ICD-10-CM | POA: Diagnosis present

## 2013-06-10 DIAGNOSIS — D539 Nutritional anemia, unspecified: Secondary | ICD-10-CM | POA: Diagnosis present

## 2013-06-10 DIAGNOSIS — I498 Other specified cardiac arrhythmias: Secondary | ICD-10-CM | POA: Diagnosis present

## 2013-06-10 DIAGNOSIS — E785 Hyperlipidemia, unspecified: Secondary | ICD-10-CM | POA: Diagnosis present

## 2013-06-10 DIAGNOSIS — I10 Essential (primary) hypertension: Secondary | ICD-10-CM | POA: Diagnosis not present

## 2013-06-10 DIAGNOSIS — B20 Human immunodeficiency virus [HIV] disease: Secondary | ICD-10-CM | POA: Diagnosis not present

## 2013-06-10 DIAGNOSIS — J810 Acute pulmonary edema: Secondary | ICD-10-CM | POA: Diagnosis not present

## 2013-06-10 DIAGNOSIS — N183 Chronic kidney disease, stage 3 unspecified: Secondary | ICD-10-CM | POA: Diagnosis not present

## 2013-06-10 DIAGNOSIS — I161 Hypertensive emergency: Secondary | ICD-10-CM | POA: Diagnosis present

## 2013-06-10 DIAGNOSIS — F17200 Nicotine dependence, unspecified, uncomplicated: Secondary | ICD-10-CM | POA: Diagnosis present

## 2013-06-10 DIAGNOSIS — I059 Rheumatic mitral valve disease, unspecified: Secondary | ICD-10-CM | POA: Diagnosis present

## 2013-06-10 DIAGNOSIS — Z79899 Other long term (current) drug therapy: Secondary | ICD-10-CM | POA: Diagnosis not present

## 2013-06-10 DIAGNOSIS — I129 Hypertensive chronic kidney disease with stage 1 through stage 4 chronic kidney disease, or unspecified chronic kidney disease: Secondary | ICD-10-CM | POA: Diagnosis not present

## 2013-06-10 DIAGNOSIS — J811 Chronic pulmonary edema: Secondary | ICD-10-CM | POA: Diagnosis not present

## 2013-06-10 DIAGNOSIS — E876 Hypokalemia: Secondary | ICD-10-CM | POA: Diagnosis not present

## 2013-06-10 DIAGNOSIS — G40909 Epilepsy, unspecified, not intractable, without status epilepticus: Secondary | ICD-10-CM | POA: Diagnosis present

## 2013-06-10 DIAGNOSIS — J9600 Acute respiratory failure, unspecified whether with hypoxia or hypercapnia: Secondary | ICD-10-CM | POA: Diagnosis not present

## 2013-06-10 DIAGNOSIS — R0602 Shortness of breath: Secondary | ICD-10-CM | POA: Diagnosis not present

## 2013-06-10 LAB — COMPREHENSIVE METABOLIC PANEL
ALT: 10 U/L (ref 0–53)
AST: 20 U/L (ref 0–37)
Alkaline Phosphatase: 125 U/L — ABNORMAL HIGH (ref 39–117)
CO2: 26 mEq/L (ref 19–32)
Calcium: 8 mg/dL — ABNORMAL LOW (ref 8.4–10.5)
Chloride: 99 mEq/L (ref 96–112)
GFR calc Af Amer: 16 mL/min — ABNORMAL LOW (ref >90)
GFR calc non Af Amer: 14 mL/min — ABNORMAL LOW (ref >90)
Glucose, Bld: 112 mg/dL — ABNORMAL HIGH (ref 70–99)
Potassium: 2.9 mEq/L — ABNORMAL LOW (ref 3.5–5.1)
Sodium: 141 mEq/L (ref 135–145)
Total Bilirubin: 0.3 mg/dL (ref 0.3–1.2)

## 2013-06-10 LAB — CBC WITH DIFFERENTIAL/PLATELET
Basophils Relative: 0 % (ref 0–1)
Eosinophils Absolute: 0.3 10*3/uL (ref 0.0–0.7)
Hemoglobin: 10.5 g/dL — ABNORMAL LOW (ref 13.0–17.0)
Lymphocytes Relative: 26 % (ref 12–46)
Lymphs Abs: 3 10*3/uL (ref 0.7–4.0)
MCH: 33.3 pg (ref 26.0–34.0)
MCV: 97.5 fL (ref 78.0–100.0)
Monocytes Relative: 7 % (ref 3–12)
Neutro Abs: 7.3 10*3/uL (ref 1.7–7.7)
Neutrophils Relative %: 64 % (ref 43–77)
RBC: 3.15 MIL/uL — ABNORMAL LOW (ref 4.22–5.81)
RDW: 14.6 % (ref 11.5–15.5)
WBC: 11.3 10*3/uL — ABNORMAL HIGH (ref 4.0–10.5)

## 2013-06-10 LAB — PRO B NATRIURETIC PEPTIDE: Pro B Natriuretic peptide (BNP): 5895 pg/mL — ABNORMAL HIGH (ref 0–125)

## 2013-06-10 MED ORDER — ZIDOVUDINE 100 MG PO CAPS
300.0000 mg | ORAL_CAPSULE | Freq: Two times a day (BID) | ORAL | Status: DC
  Administered 2013-06-11 – 2013-06-12 (×4): 300 mg via ORAL
  Filled 2013-06-10 (×5): qty 3

## 2013-06-10 MED ORDER — IPRATROPIUM BROMIDE 0.02 % IN SOLN: 0.5000 mg | Freq: Four times a day (QID) | RESPIRATORY_TRACT | Status: DC | PRN

## 2013-06-10 MED ORDER — SODIUM CHLORIDE 0.9 % IJ SOLN
3.0000 mL | Freq: Two times a day (BID) | INTRAMUSCULAR | Status: DC
  Administered 2013-06-11 – 2013-06-12 (×2): 3 mL via INTRAVENOUS

## 2013-06-10 MED ORDER — NITROGLYCERIN IN D5W 200-5 MCG/ML-% IV SOLN
5.0000 ug/min | INTRAVENOUS | Status: DC
  Administered 2013-06-10: 5 ug/min via INTRAVENOUS
  Administered 2013-06-10: 15 ug/min via INTRAVENOUS
  Administered 2013-06-12: 5 ug/min via INTRAVENOUS
  Filled 2013-06-10: qty 250

## 2013-06-10 MED ORDER — POTASSIUM CHLORIDE CRYS ER 20 MEQ PO TBCR
40.0000 meq | EXTENDED_RELEASE_TABLET | Freq: Once | ORAL | Status: AC
  Administered 2013-06-10: 40 meq via ORAL
  Filled 2013-06-10: qty 2

## 2013-06-10 MED ORDER — ACETAMINOPHEN 325 MG PO TABS
650.0000 mg | ORAL_TABLET | Freq: Four times a day (QID) | ORAL | Status: DC | PRN
  Administered 2013-06-10 – 2013-06-11 (×2): 650 mg via ORAL
  Filled 2013-06-10 (×3): qty 2

## 2013-06-10 MED ORDER — NITROGLYCERIN 0.4 MG SL SUBL
0.4000 mg | SUBLINGUAL_TABLET | Freq: Once | SUBLINGUAL | Status: AC
  Administered 2013-06-10: 0.4 mg via SUBLINGUAL

## 2013-06-10 MED ORDER — HEPARIN SODIUM (PORCINE) 5000 UNIT/ML IJ SOLN
5000.0000 [IU] | Freq: Three times a day (TID) | INTRAMUSCULAR | Status: DC
  Administered 2013-06-10 – 2013-06-12 (×5): 5000 [IU] via SUBCUTANEOUS
  Filled 2013-06-10 (×8): qty 1

## 2013-06-10 MED ORDER — POTASSIUM CHLORIDE 10 MEQ/100ML IV SOLN
10.0000 meq | INTRAVENOUS | Status: AC
  Administered 2013-06-10 – 2013-06-11 (×3): 10 meq via INTRAVENOUS
  Filled 2013-06-10: qty 100

## 2013-06-10 MED ORDER — FUROSEMIDE 10 MG/ML IJ SOLN
80.0000 mg | Freq: Once | INTRAMUSCULAR | Status: AC
  Administered 2013-06-10: 80 mg via INTRAVENOUS

## 2013-06-10 MED ORDER — SODIUM CHLORIDE 0.9 % IJ SOLN
3.0000 mL | Freq: Two times a day (BID) | INTRAMUSCULAR | Status: DC
  Administered 2013-06-11: 3 mL via INTRAVENOUS

## 2013-06-10 MED ORDER — NITROGLYCERIN 0.4 MG SL SUBL
SUBLINGUAL_TABLET | SUBLINGUAL | Status: AC
  Administered 2013-06-10: 20:00:00
  Filled 2013-06-10: qty 25

## 2013-06-10 MED ORDER — FUROSEMIDE 10 MG/ML IJ SOLN
INTRAMUSCULAR | Status: AC
  Administered 2013-06-10
  Filled 2013-06-10: qty 4

## 2013-06-10 MED ORDER — ONDANSETRON HCL 4 MG/2ML IJ SOLN: 4.0000 mg | Freq: Four times a day (QID) | INTRAMUSCULAR | Status: DC | PRN

## 2013-06-10 MED ORDER — LAMIVUDINE 100 MG PO TABS
100.0000 mg | ORAL_TABLET | Freq: Every day | ORAL | Status: DC
  Filled 2013-06-10: qty 1

## 2013-06-10 MED ORDER — SIMVASTATIN 20 MG PO TABS
20.0000 mg | ORAL_TABLET | Freq: Every day | ORAL | Status: DC
  Filled 2013-06-10: qty 1

## 2013-06-10 MED ORDER — ASPIRIN EC 325 MG PO TBEC
325.0000 mg | DELAYED_RELEASE_TABLET | Freq: Once | ORAL | Status: AC
  Administered 2013-06-11: 325 mg via ORAL
  Filled 2013-06-10: qty 1

## 2013-06-10 MED ORDER — DARUNAVIR ETHANOLATE 800 MG PO TABS
800.0000 mg | ORAL_TABLET | Freq: Every day | ORAL | Status: DC
  Administered 2013-06-11 – 2013-06-12 (×2): 800 mg via ORAL
  Filled 2013-06-10 (×3): qty 1

## 2013-06-10 MED ORDER — LEVALBUTEROL HCL 0.63 MG/3ML IN NEBU: 0.6300 mg | INHALATION_SOLUTION | Freq: Four times a day (QID) | RESPIRATORY_TRACT | Status: DC | PRN

## 2013-06-10 MED ORDER — ACETAMINOPHEN 650 MG RE SUPP: 650.0000 mg | Freq: Four times a day (QID) | RECTAL | Status: DC | PRN

## 2013-06-10 MED ORDER — HYDROCODONE-ACETAMINOPHEN 10-325 MG PO TABS
1.0000 | ORAL_TABLET | Freq: Three times a day (TID) | ORAL | Status: DC | PRN
  Administered 2013-06-12: 1 via ORAL
  Filled 2013-06-10: qty 1

## 2013-06-10 MED ORDER — ZIDOVUDINE 300 MG PO TABS
300.0000 mg | ORAL_TABLET | Freq: Two times a day (BID) | ORAL | Status: DC
  Filled 2013-06-10: qty 1

## 2013-06-10 MED ORDER — FUROSEMIDE 10 MG/ML IJ SOLN
120.0000 mg | INTRAVENOUS | Status: DC
  Filled 2013-06-10: qty 12

## 2013-06-10 MED ORDER — RITONAVIR 100 MG PO TABS
100.0000 mg | ORAL_TABLET | Freq: Every day | ORAL | Status: DC
Start: 2013-06-11 — End: 2013-06-12
  Administered 2013-06-11 – 2013-06-12 (×2): 100 mg via ORAL
  Filled 2013-06-10 (×3): qty 1

## 2013-06-10 MED ORDER — RALTEGRAVIR POTASSIUM 400 MG PO TABS
400.0000 mg | ORAL_TABLET | Freq: Two times a day (BID) | ORAL | Status: DC
  Administered 2013-06-10 – 2013-06-12 (×4): 400 mg via ORAL
  Filled 2013-06-10 (×6): qty 1

## 2013-06-10 MED ORDER — ONDANSETRON HCL 4 MG PO TABS: 4.0000 mg | ORAL_TABLET | Freq: Four times a day (QID) | ORAL | Status: DC | PRN

## 2013-06-10 NOTE — ED Provider Notes (Signed)
CSN: [id number]     Arrival date & time 06/10/13  1916 History   First MD Initiated Contact with Patient 06/10/13 1930     Chief Complaint  Patient presents with  . Shortness of Breath   (Consider location/radiation/quality/duration/timing/severity/associated sxs/prior Treatment) Patient is a 47 y.o. male presenting with shortness of breath.  Shortness of Breath Level 5 caveat due to respiratory distress Pt with history as below reports 2-3 days of worsening SOB, occasional cough significant orthopnea, but no CP or fever. Denies any worsening with walking.  Past Medical History  Diagnosis Date  . 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 Hypertension   . Seizures     last seizure was >5 years ago, pt has family history of seizures  . Syphilis 1997    history of syphilis 1997  . Chronic kidney disease     stage !! CKD, followed by Fr. Clover Mealy  . 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 Rib fractures 01/2009  . HIV infection 1980's    on ART therapy since, followed by ID clinic, complicated  by neuropathy  . 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 Family History  Problem Relation Age of Onset  . Cancer Mother   . COPD Father   . Diabetes Sister   . Hypertension Sister   . Diabetes Brother   . Hypertension Brother   . Stroke Neg Hx    History  Substance Use Topics  . Smoking status: Current Every Day Smoker -- 0.30 packs/day for 20 years    Types: Cigarettes  . Smokeless tobacco: Never Used     Comment: Trying to cut back.  . Alcohol Use: Yes     Comment: occasional    Review of Systems  Respiratory: Positive for shortness of breath.    Unable to fully assess due to medical condition    Allergies  Review of patient's allergies indicates no known  allergies.  Home Medications   Current Outpatient Rx  Name  Route  Sig  Dispense  Refill  . amLODipine (NORVASC) 10 MG tablet      TAKE ONE (1) TABLET EACH DAY   30 tablet   7   . carvedilol (COREG) 12.5 MG tablet      TAKE ONE TABLET TWICE DAILY   60 tablet   6   . colchicine 0.6 MG tablet   Oral   Take 0.6 mg by mouth every other day.          . Darunavir Ethanolate (PREZISTA) 800 MG tablet   Oral   Take 1 tablet (800 mg total) by mouth daily with breakfast.   30 tablet   11   . febuxostat (ULORIC) 40 MG tablet   Oral   Take 40 mg by mouth daily.         . furosemide (LASIX) 40 MG tablet   Oral   Take 40 mg by mouth daily.          Marland Kitchen HYDROcodone-acetaminophen (NORCO) 10-325 MG per tablet   Oral   Take 1 tablet by mouth every 8 (eight) hours as needed for pain.   90 tablet   0     DO NOT FILL UNTIL June 27, 2013   . ISENTRESS 400 MG tablet  TAKE ONE TABLET TWICE DAILY   60 tablet   6   . lamivudine (EPIVIR) 100 MG tablet   Oral   Take 1 tablet (100 mg total) by mouth daily.   30 tablet   11   . lisinopril (PRINIVIL,ZESTRIL) 40 MG tablet   Oral   Take 1 tablet (40 mg total) by mouth daily.   30 tablet   11   . pravastatin (PRAVACHOL) 40 MG tablet   Oral   Take 1 tablet (40 mg total) by mouth daily.   30 tablet   5   . ritonavir (NORVIR) 100 MG TABS   Oral   Take 1 tablet (100 mg total) by mouth daily with breakfast.   30 tablet   11   . zidovudine (RETROVIR) 300 MG tablet   Oral   Take 1 tablet (300 mg total) by mouth 2 (two) times daily.   60 tablet   11    Initial vitals not yet documented at the time of this note, but severely hypoxic in 70s on arrival. HTN with BP 217/109 and HR 115 Physical Exam  Nursing note and vitals reviewed. Constitutional: He is oriented to person, place, and time. He appears well-developed and well-nourished.  HENT:  Head: Normocephalic and atraumatic.  Eyes: EOM are normal. Pupils are  equal, round, and reactive to light.  Neck: Normal range of motion. Neck supple.  Cardiovascular: Normal rate, normal heart sounds and intact distal pulses.   Pulmonary/Chest: He is in respiratory distress. He has no wheezes. He has rales.  Abdominal: Bowel sounds are normal. He exhibits no distension. There is no tenderness.  Musculoskeletal: Normal range of motion. He exhibits edema (3+ edema to knees). He exhibits no tenderness.  Neurological: He is alert and oriented to person, place, and time. He has normal strength. No cranial nerve deficit or sensory deficit.  Skin: Skin is warm and dry. No rash noted.  Psychiatric: He has a normal mood and affect.    ED Course  Procedures (including critical care time)  CRITICAL CARE Performed by: Truddie Hidden. Total critical care time: 30 Critical care time was exclusive of separately billable procedures and treating other patients. Critical care was necessary to treat or prevent imminent or life-threatening deterioration. Critical care was time spent personally by me on the following activities: development of treatment plan with patient and/or surrogate as well as nursing, discussions with consultants, evaluation of patient's response to treatment, examination of patient, obtaining history from patient or surrogate, ordering and performing treatments and interventions, ordering and review of laboratory studies, ordering and review of radiographic studies, pulse oximetry and re-evaluation of patient's condition.   Labs Review Labs Reviewed  CBC WITH DIFFERENTIAL - Abnormal; Notable for the following:    WBC 11.3 (*)    RBC 3.15 (*)    Hemoglobin 10.5 (*)    HCT 30.7 (*)    All other components within normal limits  PRO B NATRIURETIC PEPTIDE - Abnormal; Notable for the following:    Pro B Natriuretic peptide (BNP) 5895.0 (*)    All other components within normal limits  COMPREHENSIVE METABOLIC PANEL - Abnormal; Notable for the  following:    Potassium 2.9 (*)    Glucose, Bld 112 (*)    BUN 44 (*)    Creatinine, Ser 4.61 (*)    Calcium 8.0 (*)    Albumin 2.9 (*)    Alkaline Phosphatase 125 (*)    GFR calc non Af Amer 14 (*)  GFR calc Af Amer 16 (*)    All other components within normal limits  LACTATE DEHYDROGENASE - Abnormal; Notable for the following:    LDH 367 (*)    All other components within normal limits  TROPONIN I   Imaging Review Dg Chest Portable 1 View  06/10/2013   CLINICAL DATA:  Shortness of breath  EXAM: PORTABLE CHEST - 1 VIEW  COMPARISON:  05/04/2009  FINDINGS: Heart size upper normal to mildly enlarged. Perihilar vascular congestion. Bilateral perihilar hazy consolidation with air bronchograms on the left. Small right pleural effusion.  IMPRESSION: Findings most suggestive of acute pulmonary edema.   Electronically Signed   By: Skipper Cliche M.D.   On: 06/10/2013 20:07    EKG Interpretation    Date/Time:  Friday June 10 2013 19:29:26 EST Ventricular Rate:  121 PR Interval:  141 QRS Duration: 102 QT Interval:  345 QTC Calculation: 489 R Axis:   91 Text Interpretation:  Sinus tachycardia Probable left atrial enlargement Consider right ventricular hypertrophy Borderline prolonged QT interval No significant change since last tracing Confirmed by Emersynn Deatley  MD, Darneshia Demary ([id number]) on 06/10/2013 7:44:29 PM            MDM   1. Pulmonary edema   2. Hypertensive crisis   3. Acute renal failure   4. Hypokalemia     Pt with respiratory distress, likely pulm edema from hypertensive crisis. Placed on NRB with improvement in SpO2, transitioned to BiPAP, NTG sublingual given initially. Pt has history of CKD with chronically elevated creatinine, will check labs before giving Lasix.   9:04 PM Pt improving BP and SpO2. NTG drip increased to effect. Spoke briefly with Dr. Haroldine Laws who recommends lasix 120mg  and admit to medicine service. Spoke with [facility] resident, pt to be admitted to  step down.     Camiya Vinal B. Karle Starch, MD 06/10/13 2105

## 2013-06-10 NOTE — Progress Notes (Signed)
Patient was transferred to 2C06 without any complications.

## 2013-06-10 NOTE — ED Notes (Signed)
80 of PO potassium given and 80mg  of Lasix given IVP.

## 2013-06-10 NOTE — ED Notes (Signed)
Internal med at bedside.

## 2013-06-10 NOTE — H&P (Signed)
Date: 06/10/2013               Patient Name:  Darrell Johnson MRN: [id number]  DOB: [birth year] Age / Sex: 47 y.o., male   PCP: Lesly Dukes, MD         Medical Service: Internal Medicine Teaching Service         Attending Physician: Dr. Karren Cobble, MD    First Contact: Dr. Naaman Plummer Pager: [phone]  Second Contact: Dr. Algis Liming Pager: [phone]       After Hours (After 5p/  First Contact Pager: [phone]  weekends / holidays): Second Contact Pager: [phone]   Chief Complaint: SOB  History of Present Illness: Darrell Johnson is a 47 year old African American male with a PMH of poorly controlled hypertension, HLD, CKD (baseline ~2.5), HIV, HLD, Seizures.  He was brought to the [facility] today by his nephew after his nephew visited him today and found him acutely SOB.  Patient reports that his has had worsening SOB over the past 2-3 days.  In addition he reports worsening orthopnea, a mild nonproductive cough, and increased swelling of his B/L lower extremities.  He believes he may have had a recent sinus infection but reports that has mostly resolved. He endorsed some sternal chest pain which occurred earlier today that lasted for 30 minutes, this pain was non-radiating and not exertional or relieved by rest.  He reports that he has difficultly with his mail order pharmacy in getting his medications on time. He was out of many of his medications (although he is not exactly sure which medications).  He reports that he did receive his medications 2 days ago and resumed taking his medications.  He attributes his lower extremity swelling to his resumption of amlodipine but does admit that this time the swelling is worse than previous.  He does admit some mild nausea and a HA after nitroglycerine drip was started in the ED, he has not vomited.  He denies any palpitations, fever, chills, abdominal pain.  He denies any recent sick contacts.   Meds: Current Facility-Administered Medications  Medication  Dose Route Frequency Provider Last Rate Last Dose  . furosemide (LASIX) 10 MG/ML injection           . furosemide (LASIX) 10 MG/ML injection           . nitroGLYCERIN 0.2 mg/mL in dextrose 5 % infusion  5 mcg/min Intravenous Titrated Charles B. Karle Starch, MD 4.5 mL/hr at 06/10/13 2124 15 mcg/min at 06/10/13 2124  . potassium chloride 10 mEq in 100 mL IVPB  10 mEq Intravenous Q1 Hr x 3 Charles B. Karle Starch, MD       Current Outpatient Prescriptions  Medication Sig Dispense Refill  . amLODipine (NORVASC) 10 MG tablet TAKE ONE (1) TABLET EACH DAY  30 tablet  7  . carvedilol (COREG) 12.5 MG tablet TAKE ONE TABLET TWICE DAILY  60 tablet  6  . colchicine 0.6 MG tablet Take 0.6 mg by mouth every other day.       . Darunavir Ethanolate (PREZISTA) 800 MG tablet Take 1 tablet (800 mg total) by mouth daily with breakfast.  30 tablet  11  . febuxostat (ULORIC) 40 MG tablet Take 40 mg by mouth daily.      . furosemide (LASIX) 40 MG tablet Take 40 mg by mouth daily.       Marland Kitchen HYDROcodone-acetaminophen (NORCO) 10-325 MG per tablet Take 1 tablet by mouth every 8 (eight) hours as needed  for pain.  90 tablet  0  . ISENTRESS 400 MG tablet TAKE ONE TABLET TWICE DAILY  60 tablet  6  . lamivudine (EPIVIR) 100 MG tablet Take 1 tablet (100 mg total) by mouth daily.  30 tablet  11  . lisinopril (PRINIVIL,ZESTRIL) 40 MG tablet Take 1 tablet (40 mg total) by mouth daily.  30 tablet  11  . pravastatin (PRAVACHOL) 40 MG tablet Take 1 tablet (40 mg total) by mouth daily.  30 tablet  5  . ritonavir (NORVIR) 100 MG TABS Take 1 tablet (100 mg total) by mouth daily with breakfast.  30 tablet  11  . zidovudine (RETROVIR) 300 MG tablet Take 1 tablet (300 mg total) by mouth 2 (two) times daily.  60 tablet  11    Allergies: Allergies as of 06/10/2013  . (No Known Allergies)   Past Medical History  Diagnosis Date  . 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 Hypertension   . Seizures      last seizure was >5 years ago, pt has family history of seizures  . Syphilis 1997    history of syphilis 1997  . Chronic kidney disease     stage !! CKD, followed by Fr. Clover Mealy  . 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 Rib fractures 01/2009  . HIV infection 1980's    on ART therapy since, followed by ID clinic, complicated  by neuropathy  . 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 Family History  Problem Relation Age of Onset  . Cancer Mother   . COPD Father   . Diabetes Sister   . Hypertension Sister   . Diabetes Brother   . Hypertension Brother   . Stroke Neg Hx    History   Social History  . Marital Status: Married    Spouse Name: N/A    Number of Children: N/A  . Years of Education: N/A   Occupational History  . Not on file.   Social History Main Topics  . Smoking status: Current Every Day Smoker -- 0.30 packs/day for 20 years    Types: Cigarettes  . Smokeless tobacco: Never Used     Comment: Trying to cut back.  . Alcohol Use: Yes     Comment: occasional  . Drug Use: No  . Sexual Activity: Not on file   Other Topics Concern  . Not on file   Social History Narrative  . No narrative on file    Review of Systems: Review of Systems  Constitutional: Negative for fever, chills and malaise/fatigue.  HENT: Negative for congestion and sore throat.   Eyes: Negative for blurred vision, double vision and photophobia.  Respiratory: Positive for cough and shortness of breath. Negative for sputum production.   Cardiovascular: Positive for chest pain.  Gastrointestinal: Positive for nausea. Negative for heartburn, vomiting, abdominal pain, diarrhea, constipation, blood in stool and melena.  Genitourinary: Negative for dysuria and urgency.  Musculoskeletal: Negative for back pain.  Neurological:  Positive for headaches (after nitro drip started). Negative for dizziness, sensory change, speech change, focal weakness and weakness.  Psychiatric/Behavioral: The patient is not nervous/anxious.      Physical Exam: Blood pressure 174/95, pulse 102, temperature 98.7 F (37.1 C), temperature source Oral, resp. rate 13, SpO2 100.00%.on  BiPAP Physical Exam  Nursing note and vitals reviewed. Constitutional: He is oriented to person, place, and time.  On BiPAP  HENT:  Head: Normocephalic and atraumatic.  Eyes: EOM are normal. Pupils are equal, round, and reactive to light.  Fundoscopic exam:      The right eye shows no AV nicking, no hemorrhage and no papilledema. The right eye shows red reflex.       The left eye shows no AV nicking, no hemorrhage and no papilledema. The left eye shows red reflex.  Cardiovascular: Normal rate, regular rhythm and intact distal pulses.   No murmur heard. Tachycardic  Pulmonary/Chest: Effort normal. He has wheezes. He exhibits no tenderness.  Crackles over lower and middle posterior lung fields b/l.  Abdominal: Soft. Bowel sounds are normal. He exhibits distension. There is no tenderness. There is no rebound and no guarding.  Musculoskeletal: He exhibits edema (3+ edema to knee b/l).  Neurological: He is alert and oriented to person, place, and time. No cranial nerve deficit.  Skin: Skin is warm and dry.  Psychiatric: Affect normal.     Lab results: Basic Metabolic Panel:  Recent Labs  06/10/13 1933  NA 141  K 2.9*  CL 99  CO2 26  GLUCOSE 112*  BUN 44*  CREATININE 4.61*  CALCIUM 8.0*  AG: 16 Liver Function Tests:  Recent Labs  06/10/13 1933  AST 20  ALT 10  ALKPHOS 125*  BILITOT 0.3  PROT 7.1  ALBUMIN 2.9*   CBC:  Recent Labs  06/10/13 1924  WBC 11.3*  NEUTROABS 7.3  HGB 10.5*  HCT 30.7*  MCV 97.5  PLT 230   Cardiac Enzymes:  Recent Labs  06/10/13 1924  TROPONINI <0.30   BNP:  Recent Labs  06/10/13 1924   PROBNP 5895.0*   Urine Drug Screen: Drugs of Abuse     Component Value Date/Time   LABOPIA POSITIVE* 06/10/2013 2357   LABOPIA PPS 11/12/2012 1155   COCAINSCRNUR NONE DETECTED 06/10/2013 2357   COCAINSCRNUR NEG 11/12/2012 1155   LABBENZ NONE DETECTED 06/10/2013 2357   LABBENZ NEG 11/12/2012 1155   AMPHETMU NONE DETECTED 06/10/2013 2357   THCU NONE DETECTED 06/10/2013 2357   LABBARB NONE DETECTED 06/10/2013 2357   LABBARB NEG 11/12/2012 1155     Recent Labs Lab 06/11/13 06/11/13 0221  PHART 7.348* 7.374  PCO2ART 53.5* 49.2*  PO2ART 175.0* 80.9  HCO3 28.6* 28.0*  TCO2 30.3 29.5  O2SAT 99.0 95.8    Imaging results:  Dg Chest Portable 1 View  06/10/2013   CLINICAL DATA:  Shortness of breath  EXAM: PORTABLE CHEST - 1 VIEW  COMPARISON:  05/04/2009  FINDINGS: Heart size upper normal to mildly enlarged. Perihilar vascular congestion. Bilateral perihilar hazy consolidation with air bronchograms on the left. Small right pleural effusion.  IMPRESSION: Findings most suggestive of acute pulmonary edema.   Electronically Signed   By: Skipper Cliche M.D.   On: 06/10/2013 20:07    Other results: EKG: Sinus tachycardia, Normal Axis, no ST or T wave changes.  T wave inversions seen on previous EKG are no longer present.  Assessment & Plan by Problem: Mr. Schlaud is a 47 year old male with PMH of uncontrolled HTN, CKD, HIV who is admitted for acute respiratory failure secondary to pulmonary edema in the setting of hypertensive emergency.  Acute respiratory failure secondary to Acute Heart Failure in the setting of Hypertensive emergency - Patient presented with acute SOB, leg swelling, found to have acute pulmonary edema  on CXR without history of CHF.  His pro BNP was elevated at 5895. On presentation patient was found to have BP of 216/113, hypertensive emergency is thought to be likely cause of patient's acute heart failure and pulmonary edema.  However other causes of acute heart failure will  need to be ruled out. Patient did have an echo in Feb 2014 that showed normal EF mild to moderate MR. Drug induced less likely as UDS today was negative for cocaine, patient reports only occasional alcohol use.  Arrhythmia less likely as EKG shows sinus tachycardia without other arrhythmia.  Patient has no history of thyroid dysfunction.  PE felt to be less likely as Wells Score is 1.5 (due to tachycardia).  Anemia is a potential cause but less likely given patient has only a mild anemia.  Infectious causes considered however patient has no fever or leukocytosis.  Patient does have history of Acute on CKD which could be a contributing factor for his acute respiratory failure. - Admit to step-down (inpatient) - Nitro drip started in ED to reduce BP by 10-20%.  Will continue drip to continue reduction in BP of 5-15% over next 23 hours.  Will eventually  -  Patient placed on BIPAP in ED, with FiO2 of 50% this was initially continued.  ABG completed while on this setting showed a likely chronic respiratory acidosis with PaO2 175, BiPAP was then discontinued and patient placed on 2L [place], repeat ABG showed PaO2 of 80.9 and likely chronic respiratory acidosis. Will continue with supplemental O2 via nasal canula. - Patient received 80mg  lasix IV in ED with good urine output.  Will continue to monitor I&O, daily weights.  Will likely need additional 80mg  IV lasix in AM. - Troponins x3 to r/o ACS as cause of HF - Check TSH - Will not check D- Dimer as will likely be elevated in the setting of Acute on CKD. CTA contraindicated due to Acute on CKD.  Will consider V/Q Scan if respiratory status fails to improve with diuresis. -Patient does have a HA after Nitroglycerine drip started.  Patient had no altered mental status or confusion.  On my fundoscopic exam without dilation I did not appreciate any retinal hemorrhages, papilledema, or other signs that would be concerning for hypertensive encephalopathy   - Patient may  benefit from PFTs as an outpatient. - Likely will need repeat Echo this admission (last echo Feb 2014)  Acute on Chronic Kidney Disease Stage 3 - Est GFR for the last year has been 38-45. Creatine has steadily trended up over the past 2 years.   On admission creatinine was found to be 4.61.  Patient is currently volume overloaded and his acute renal failure may be contributory vs cause of his acute respiratory failure. - Will monitor creatine given diuresis and acute heart failure. - Patient was supposed to see Dr. Moshe Cipro in November unclear if he kept this appointment at this time.  Hypokalemia -K+ 2.9 on admission, given 40Kdur x2. With 3 runs of 10MEq IV potassium. Will monitor potassium closely given diuresis. - Check Mg++- 1.3 - Will replace   HIV disease - Well controlled CD 4 count 600/VL<20 (sep 2014) sees [name] at [place], Raltegravir, lamivudine, ritonavir, zidovudine  Anemia - Patient has history of macrocytic anemia, however recent blood work shows Hgb wnl. Patient Hgb on admission today was 10.5 with MCV of 97.5. -No signs of active bleeding. - Continue to monitor Hgb, consider outpatient workup.  Dispo: Disposition is  deferred at this time, awaiting improvement of current medical problems. Anticipated discharge in approximately 2-3 day(s).   The patient does have a current PCP (Lesly Dukes, MD) and does need an [facility] hospital follow-up appointment after discharge.  The patient does not have transportation limitations that hinder transportation to clinic appointments.  Signed: Joni Reining, DO 06/10/2013, 10:05 PM

## 2013-06-10 NOTE — ED Notes (Signed)
X-ray at bedside

## 2013-06-10 NOTE — ED Notes (Signed)
Respiratory at bedside.

## 2013-06-10 NOTE — ED Notes (Signed)
Pt using belly and accessory muscles; 76% RA sats. MD at bedside.

## 2013-06-11 ENCOUNTER — Encounter (HOSPITAL_COMMUNITY): Payer: Self-pay | Admitting: General Practice

## 2013-06-11 DIAGNOSIS — I161 Hypertensive emergency: Secondary | ICD-10-CM | POA: Diagnosis present

## 2013-06-11 DIAGNOSIS — J9600 Acute respiratory failure, unspecified whether with hypoxia or hypercapnia: Secondary | ICD-10-CM | POA: Diagnosis present

## 2013-06-11 DIAGNOSIS — J810 Acute pulmonary edema: Secondary | ICD-10-CM | POA: Diagnosis present

## 2013-06-11 DIAGNOSIS — N179 Acute kidney failure, unspecified: Secondary | ICD-10-CM | POA: Diagnosis not present

## 2013-06-11 DIAGNOSIS — B20 Human immunodeficiency virus [HIV] disease: Secondary | ICD-10-CM | POA: Diagnosis not present

## 2013-06-11 DIAGNOSIS — I129 Hypertensive chronic kidney disease with stage 1 through stage 4 chronic kidney disease, or unspecified chronic kidney disease: Secondary | ICD-10-CM | POA: Diagnosis not present

## 2013-06-11 LAB — URINALYSIS, ROUTINE W REFLEX MICROSCOPIC
Bilirubin Urine: NEGATIVE
Ketones, ur: NEGATIVE mg/dL
Leukocytes, UA: NEGATIVE
Nitrite: NEGATIVE
Protein, ur: 100 mg/dL — AB
Specific Gravity, Urine: 1.007 (ref 1.005–1.030)
Urobilinogen, UA: 0.2 mg/dL (ref 0.0–1.0)
pH: 7.5 (ref 5.0–8.0)

## 2013-06-11 LAB — BASIC METABOLIC PANEL
BUN: 44 mg/dL — ABNORMAL HIGH (ref 6–23)
BUN: 44 mg/dL — ABNORMAL HIGH (ref 6–23)
Calcium: 8.1 mg/dL — ABNORMAL LOW (ref 8.4–10.5)
Calcium: 8.1 mg/dL — ABNORMAL LOW (ref 8.4–10.5)
Creatinine, Ser: 4.76 mg/dL — ABNORMAL HIGH (ref 0.50–1.35)
GFR calc Af Amer: 15 mL/min — ABNORMAL LOW (ref >90)
GFR calc Af Amer: 15 mL/min — ABNORMAL LOW (ref >90)
GFR calc non Af Amer: 13 mL/min — ABNORMAL LOW (ref >90)
GFR calc non Af Amer: 13 mL/min — ABNORMAL LOW (ref >90)
Glucose, Bld: 84 mg/dL (ref 70–99)
Glucose, Bld: 70 mg/dL (ref 70–99)
Potassium: 3.8 mEq/L (ref 3.5–5.1)
Sodium: 141 mEq/L (ref 135–145)

## 2013-06-11 LAB — CBC
MCH: 33.3 pg (ref 26.0–34.0)
MCHC: 33.9 g/dL (ref 30.0–36.0)
Platelets: 226 10*3/uL (ref 150–400)
RBC: 3 MIL/uL — ABNORMAL LOW (ref 4.22–5.81)
RDW: 14.8 % (ref 11.5–15.5)

## 2013-06-11 LAB — BLOOD GAS, ARTERIAL
Acid-Base Excess: 3.2 mmol/L — ABNORMAL HIGH (ref 0.0–2.0)
Bicarbonate: 28 mEq/L — ABNORMAL HIGH (ref 20.0–24.0)
Bicarbonate: 28.6 mEq/L — ABNORMAL HIGH (ref 20.0–24.0)
Drawn by: 13898
Expiratory PAP: 6
Inspiratory PAP: 12
O2 Saturation: 95.8 %
O2 Saturation: 99 %
Patient temperature: 98.6
TCO2: 29.5 mmol/L (ref 0–100)
pCO2 arterial: 53.5 mmHg — ABNORMAL HIGH (ref 35.0–45.0)
pH, Arterial: 7.348 — ABNORMAL LOW (ref 7.350–7.450)
pO2, Arterial: 175 mmHg — ABNORMAL HIGH (ref 80.0–100.0)

## 2013-06-11 LAB — RAPID URINE DRUG SCREEN, HOSP PERFORMED
Barbiturates: NOT DETECTED
Benzodiazepines: NOT DETECTED
Opiates: POSITIVE — AB
Tetrahydrocannabinol: NOT DETECTED

## 2013-06-11 LAB — TSH: TSH: 1.241 u[IU]/mL (ref 0.350–4.500)

## 2013-06-11 LAB — URINE MICROSCOPIC-ADD ON

## 2013-06-11 LAB — TROPONIN I
Troponin I: <0.3 ng/mL (ref <0.30)
Troponin I: <0.3 ng/mL (ref <0.30)

## 2013-06-11 LAB — MRSA PCR SCREENING: MRSA by PCR: NEGATIVE

## 2013-06-11 MED ORDER — PRAVASTATIN SODIUM 40 MG PO TABS
40.0000 mg | ORAL_TABLET | Freq: Every day | ORAL | Status: DC
  Administered 2013-06-11: 40 mg via ORAL
  Filled 2013-06-11 (×2): qty 1

## 2013-06-11 MED ORDER — CARVEDILOL 12.5 MG PO TABS
12.5000 mg | ORAL_TABLET | Freq: Two times a day (BID) | ORAL | Status: DC
  Administered 2013-06-11 – 2013-06-12 (×3): 12.5 mg via ORAL
  Filled 2013-06-11 (×5): qty 1

## 2013-06-11 MED ORDER — LISINOPRIL 40 MG PO TABS
40.0000 mg | ORAL_TABLET | Freq: Every day | ORAL | Status: DC
  Administered 2013-06-11 – 2013-06-12 (×2): 40 mg via ORAL
  Filled 2013-06-11 (×2): qty 1

## 2013-06-11 MED ORDER — FUROSEMIDE 40 MG PO TABS
40.0000 mg | ORAL_TABLET | Freq: Every day | ORAL | Status: DC
  Administered 2013-06-11 – 2013-06-12 (×2): 40 mg via ORAL
  Filled 2013-06-11 (×3): qty 1

## 2013-06-11 MED ORDER — AMLODIPINE BESYLATE 10 MG PO TABS
10.0000 mg | ORAL_TABLET | Freq: Every day | ORAL | Status: DC
  Administered 2013-06-11 – 2013-06-12 (×2): 10 mg via ORAL
  Filled 2013-06-11 (×2): qty 1

## 2013-06-11 MED ORDER — LAMIVUDINE 10 MG/ML PO SOLN
100.0000 mg | Freq: Every day | ORAL | Status: DC
  Administered 2013-06-11 – 2013-06-12 (×2): 100 mg via ORAL
  Filled 2013-06-11 (×2): qty 10

## 2013-06-11 MED ORDER — MAGNESIUM SULFATE 40 MG/ML IJ SOLN
2.0000 g | Freq: Once | INTRAMUSCULAR | Status: AC
  Administered 2013-06-11: 2 g via INTRAVENOUS
  Filled 2013-06-11: qty 50

## 2013-06-11 NOTE — H&P (Signed)
Internal Medicine Attending Admission Note Date: 06/11/2013  Patient name: Darrell Johnson Medical record number: [id number] Date of birth: [birth year] Age: 47 y.o. Gender: male  I saw and evaluated the patient. I reviewed the resident's note and I agree with the resident's findings and plan as documented in the resident's note.  Briefly, Darrell Johnson is a 47 y.o. man with a history of hypertension, chronic kidney disease, and well controlled HIV disease who presents with 2-3 days of increasing SOB w/o fevers or sick contacts.  He recently ran out of his medications and when he presented to the ED was found to have a blood pressure of 216/113, O2 sat of about 70%, a CXR consistent with bilateral pulmonary edema with a right pleural effusion, and ECG with pseudonormalization of inferolateral T waves compared to a tracing done in May 2014.  He was initially treated with IV lasix resulting in a brisk diuresis and IV nitroglycerin.  He was also rested on BiPap until the diuresis took place and has since been weaned to room air.  He was admitted to the Internal Medicine Teaching Service with a hypertensive emergency complicated by acute pulmonary edema and acute on chronic renal failure.  His blood pressure has been treated with an IV nitroglycerin drip but he is now ready for conversion to oral medications.  BP this AM is 195/110, RA O2 sat 95%, examination is remarkable for end inspiratory squeaks and scattered crackles. Cardiac examination is w/o murmurs or rubs.  I agree with the housestaff's plan to start his usual oral antihypertensive therapy, wean the nitroglycerin drip to keep the BP in the 170/100 range, and monitor renal function with control of the afterload..  If he remains stable on his oral regimen he will be discharged home with quick follow-up in the [place] where further escalation of his antihypertensive regimen will occur to a goal of at least < 140/90.  Prior to discharge,  we will assess his cardiac function with an Echocardiogram in case he suffered a silent event since February when he had a normal Echo but had T wave inversions on a May ECG which have since normalized.

## 2013-06-11 NOTE — Progress Notes (Signed)
Subjective:   Pt seen and examined in AM. No acute events overnight. Pt reports his dyspnea and swelling has improved.  No fever, chills, cough, nausea, vomiting,  abdominal pain, or change in BM.     Objective: Vital signs in last 24 hours: Filed Vitals:   06/11/13 0443 06/11/13 0500 06/11/13 0800 06/11/13 1400  BP: 170/107  175/114 167/90  Pulse: 91  101 103  Temp: 97.9 F (36.6 C)  97.9 F (36.6 C) 97.7 F (36.5 C)  TempSrc: Oral   Oral  Resp: 15  17 25   Height:      Weight:  254 lb 13.6 oz (115.6 kg)    SpO2: 97%  97% 93%   Weight change:   Intake/Output Summary (Last 24 hours) at 06/11/13 1457 Last data filed at 06/11/13 1300  Gross per 24 hour  Intake 102.18 ml  Output   3075 ml  Net -2972.82 ml   Constitutional: He is oriented to person, place, and time.  Head: Normocephalic and atraumatic.  Eyes: EOM are normal. Pupils are equal, round, and reactive to light.  Cardiovascular: Normal rate, regular rhythm and intact distal pulses.  No murmur heard. Tachycardic  Pulmonary/Chest: Effort normal. He exhibits no tenderness. Crackles at bases and scattered end-expiratory wheezing.  Abdominal: Soft. Bowel sounds are normal. He exhibits . There is no tenderness. There is no rebound and no guarding.  Musculoskeletal: He exhibits  +2/3 pitting edema to knees.  Neurological: He is alert and oriented to person, place, and time. No cranial nerve deficit.  Skin: Skin is warm and dry.  Psychiatric: Affect normal.    Lab Results: Basic Metabolic Panel:  Recent Labs Lab 06/10/13 1933 06/11/13 0140 06/11/13 0500  NA 141 141 142  K 2.9* 3.8 4.0  CL 99 101 103  CO2 26 28 25   GLUCOSE 112* 84 70  BUN 44* 44* 44*  CREATININE 4.61* 4.85* 4.76*  CALCIUM 8.0* 8.1* 8.1*  MG 1.3*  --  1.4*   Liver Function Tests:  Recent Labs Lab 06/10/13 1933  AST 20  ALT 10  ALKPHOS 125*  BILITOT 0.3  PROT 7.1  ALBUMIN 2.9*   No results found for this basename: LIPASE,  AMYLASE,  in the last 168 hours No results found for this basename: AMMONIA,  in the last 168 hours CBC:  Recent Labs Lab 06/10/13 1924 06/11/13 0500  WBC 11.3* 10.1  NEUTROABS 7.3  --   HGB 10.5* 10.0*  HCT 30.7* 29.5*  MCV 97.5 98.3  PLT 230 226   Cardiac Enzymes:  Recent Labs Lab 06/10/13 1924 06/11/13 0140 06/11/13 0955  TROPONINI <0.30 <0.30 <0.30   BNP:  Recent Labs Lab 06/10/13 1924  PROBNP 5895.0*   D-Dimer: No results found for this basename: DDIMER,  in the last 168 hours CBG:  Recent Labs Lab 06/11/13 0804  GLUCAP 76   Hemoglobin A1C: No results found for this basename: HGBA1C,  in the last 168 hours Fasting Lipid Panel: No results found for this basename: CHOL, HDL, LDLCALC, TRIG, CHOLHDL, LDLDIRECT,  in the last 168 hours Thyroid Function Tests: No results found for this basename: TSH, T4TOTAL, FREET4, T3FREE, THYROIDAB,  in the last 168 hours Coagulation: No results found for this basename: LABPROT, INR,  in the last 168 hours Anemia Panel: No results found for this basename: VITAMINB12, FOLATE, FERRITIN, TIBC, IRON, RETICCTPCT,  in the last 168 hours Urine Drug Screen: Drugs of Abuse     Component Value Date/Time  LABOPIA POSITIVE* 06/10/2013 2357   LABOPIA PPS 11/12/2012 1155   COCAINSCRNUR NONE DETECTED 06/10/2013 2357   COCAINSCRNUR NEG 11/12/2012 1155   LABBENZ NONE DETECTED 06/10/2013 2357   LABBENZ NEG 11/12/2012 1155   AMPHETMU NONE DETECTED 06/10/2013 2357   THCU NONE DETECTED 06/10/2013 2357   LABBARB NONE DETECTED 06/10/2013 2357   LABBARB NEG 11/12/2012 1155    Alcohol Level: No results found for this basename: ETH,  in the last 168 hours Urinalysis:  Recent Labs Lab 06/10/13 2357  COLORURINE YELLOW  LABSPEC 1.007  PHURINE 7.5  GLUCOSEU NEGATIVE  HGBUR SMALL*  BILIRUBINUR NEGATIVE  KETONESUR NEGATIVE  PROTEINUR 100*  UROBILINOGEN 0.2  NITRITE NEGATIVE  LEUKOCYTESUR NEGATIVE    Micro Results: Recent Results  (from the past 240 hour(s))  MRSA PCR SCREENING     Status: None   Collection Time    06/10/13 11:00 PM      Result Value Range Status   MRSA by PCR NEGATIVE  NEGATIVE Final   Comment:            The GeneXpert MRSA Assay (FDA     approved for NASAL specimens     only), is one component of a     comprehensive MRSA colonization     surveillance program. It is not     intended to diagnose MRSA     infection nor to guide or     monitor treatment for     MRSA infections.   Studies/Results: Dg Chest Portable 1 View  06/10/2013   CLINICAL DATA:  Shortness of breath  EXAM: PORTABLE CHEST - 1 VIEW  COMPARISON:  05/04/2009  FINDINGS: Heart size upper normal to mildly enlarged. Perihilar vascular congestion. Bilateral perihilar hazy consolidation with air bronchograms on the left. Small right pleural effusion.  IMPRESSION: Findings most suggestive of acute pulmonary edema.   Electronically Signed   By: Skipper Cliche M.D.   On: 06/10/2013 20:07   Medications: I have reviewed the patient's current medications. Scheduled Meds: . amLODipine  10 mg Oral Daily  . carvedilol  12.5 mg Oral BID WC  . Darunavir Ethanolate  800 mg Oral Q breakfast  . furosemide  40 mg Oral Q breakfast  . heparin  5,000 Units Subcutaneous Q8H  . lamiVUDine  100 mg Oral Daily  . lisinopril  40 mg Oral Daily  . pravastatin  40 mg Oral q1800  . raltegravir  400 mg Oral BID  . ritonavir  100 mg Oral Q breakfast  . sodium chloride  3 mL Intravenous Q12H  . sodium chloride  3 mL Intravenous Q12H  . zidovudine  300 mg Oral BID   Continuous Infusions: . nitroGLYCERIN 30 mcg/min (06/11/13 0346)   PRN Meds:.acetaminophen, acetaminophen, HYDROcodone-acetaminophen, ipratropium, levalbuterol, ondansetron (ZOFRAN) IV, ondansetron Assessment/Plan: Principal Problem:   Hypertensive emergency Active Problems:   HIV DISEASE   MACROCYTIC ANEMIA   HYPERTENSION   CKD (chronic kidney disease) stage 3, GFR 30-59 ml/min   Acute  respiratory failure   Acute pulmonary edema  Assessment Darrell Johnson is a 47 year old male with PMH of uncontrolled HTN, CKD, HIV who is admitted for acute respiratory failure secondary to pulmonary edema in the setting of hypertensive emergency.    Plan:  Hypertensive emergency complicated by acute pulmonary edema with acute respiratory failure-  Patient presented with acute SOB, leg swelling, found to have acute pulmonary edema on CXR without history of CHF. His  BNP was elevated at 5895. On presentation  patient was found to have BP of 216/113, hypertensive emergency is thought to be likely cause of patient's acute heart failure and pulmonary edema. However other causes of acute heart failure will need to be ruled out. Patient did have an echo in Feb 2014 that showed normal EF mild to moderate MR. Drug induced less likely as UDS was negative for cocaine, patient reports only occasional alcohol use. Arrhythmia less likely as EKG shows sinus tachycardia without other arrhythmia. Patient has no history of thyroid dysfunction. PE felt to be less likely as Wells Score is 1.5 (due to tachycardia). Anemia is a potential cause but less likely given patient has only a mild anemia. Infectious causes considered however patient has no fever or leukocytosis. Patient does have history of Acute on CKD which could be a contributing factor for his acute respiratory failure. Pt was started on Nitro drip started in ED to reduce BP. Patient placed on BIPAP in ED, with FiO2 of 50% this was initially continued. ABG completed while on this setting showed a likely chronic respiratory acidosis with PaO2 175, BiPAP was then discontinued and patient placed on 2L [place], repeat ABG showed PaO2 of 80.9 and likely chronic respiratory acidosis. Patient received 80mg  lasix IV in ED with good urine output. -Monitor BP closely -Monitor I&O --> 3.075L since admission -Monitor weight-->  No change -Start home  amlodipine 10 mg , carvedilol  12.5 mg BID, lisinopril 40 mg--> wean nitro drop to keep BP in 170>100  -IV Lasix to home PO 40 mg Lasix -Troponins x3 --> negative   -TSH pending  - Obtain 2D-Echo (Last Echo Feb 2014)  - Patient may benefit from PFTs as an outpatient - Continue home ASA daily  Acute on Chronic Kidney Disease Stage 3 - Possibly due to cardiorenal syndrome  vs uncontrolled BP. Pt with est GFR for the last year 38-45. Creatine has steadily trended up over the past 2 years. On admission creatinine was found to be 4.61.  Patient was supposed to see Dr. Moshe Cipro in November unclear if he kept this appointment at this time.  -Continue diuretic therapy  Headache -  Most likely due to nitroglycerin drip.   -Wean nitro drip -Tylenol, norco PRN pain   Hypokalemia -resolved.  Pt with K of 2.9 on admission. Pt received 40Kdur x2 and 3 runs of 10MEq IV potassium. Magnesium also low and supplemented -Continue to monitor BMP  HIV disease - stable. Last CD 4 count 600/VL<20 (Sept 2014), follows with [name] at [place] [facility] Darunavir, Raltegravir, lamivudine, ritonavir, zidovudine   Normocytic Anemia - stable, asymptomatic. Pt with history of macrocytic anemia. Pt with Hg of 10.5 on admission with MCV of 97.5. No reports of bleeding. -Continue to monitor CBC -Monitor for bleeding -Obtain anemia panel   Diet: Heart  DVT Ppx: LMW heparin  Code: Full   Dispo: Disposition is deferred at this time, awaiting improvement of current medical problems.  Anticipated discharge in approximately 1 day.   The patient does have a current PCP (Lesly Dukes, MD) and does need an [facility] hospital follow-up appointment after discharge.  The patient does not have transportation limitations that hinder transportation to clinic appointments.  .Services Needed at time of discharge: Y = Yes, Blank = No PT:   OT:   RN:   Equipment:   Other:     LOS: 1 day   Juluis Mire, MD 06/11/2013, 2:57 PM

## 2013-06-12 DIAGNOSIS — I059 Rheumatic mitral valve disease, unspecified: Secondary | ICD-10-CM | POA: Diagnosis not present

## 2013-06-12 LAB — CBC
HCT: 29.9 % — ABNORMAL LOW (ref 39.0–52.0)
Hemoglobin: 10.3 g/dL — ABNORMAL LOW (ref 13.0–17.0)
MCH: 33.3 pg (ref 26.0–34.0)
MCV: 96.8 fL (ref 78.0–100.0)
RBC: 3.09 MIL/uL — ABNORMAL LOW (ref 4.22–5.81)
RDW: 14.4 % (ref 11.5–15.5)

## 2013-06-12 LAB — BASIC METABOLIC PANEL
BUN: 45 mg/dL — ABNORMAL HIGH (ref 6–23)
CO2: 25 mEq/L (ref 19–32)
Calcium: 8.8 mg/dL (ref 8.4–10.5)
Chloride: 101 mEq/L (ref 96–112)
Creatinine, Ser: 4.43 mg/dL — ABNORMAL HIGH (ref 0.50–1.35)
GFR calc Af Amer: 17 mL/min — ABNORMAL LOW (ref >90)
GFR calc non Af Amer: 14 mL/min — ABNORMAL LOW (ref >90)

## 2013-06-12 LAB — RETICULOCYTES: Retic Count, Absolute: 58.7 10*3/uL (ref 19.0–186.0)

## 2013-06-12 LAB — FOLATE: Folate: 6.3 ng/mL

## 2013-06-12 LAB — IRON AND TIBC
Saturation Ratios: 15 % — ABNORMAL LOW (ref 20–55)
TIBC: 231 ug/dL (ref 215–435)

## 2013-06-12 LAB — VITAMIN B12: Vitamin B-12: 503 pg/mL (ref 211–911)

## 2013-06-12 MED ORDER — CARVEDILOL 25 MG PO TABS
25.0000 mg | ORAL_TABLET | Freq: Two times a day (BID) | ORAL | Status: DC
  Filled 2013-06-12 (×2): qty 1

## 2013-06-12 MED ORDER — POTASSIUM CHLORIDE CRYS ER 20 MEQ PO TBCR
40.0000 meq | EXTENDED_RELEASE_TABLET | Freq: Once | ORAL | Status: AC
  Administered 2013-06-12: 40 meq via ORAL
  Filled 2013-06-12: qty 2

## 2013-06-12 MED ORDER — PRAVASTATIN SODIUM 40 MG PO TABS: 40.0000 mg | ORAL_TABLET | Freq: Every day | ORAL | Status: DC

## 2013-06-12 MED ORDER — FUROSEMIDE 40 MG PO TABS: 40.0000 mg | ORAL_TABLET | Freq: Every day | ORAL | Status: DC

## 2013-06-12 MED ORDER — FLUTICASONE PROPIONATE 50 MCG/ACT NA SUSP
2.0000 | Freq: Every day | NASAL | Status: DC
  Administered 2013-06-12: 2 via NASAL
  Filled 2013-06-12: qty 16

## 2013-06-12 MED ORDER — LORATADINE 10 MG PO TABS
10.0000 mg | ORAL_TABLET | Freq: Every day | ORAL | Status: DC
  Filled 2013-06-12: qty 1

## 2013-06-12 MED ORDER — LISINOPRIL 40 MG PO TABS: 40.0000 mg | ORAL_TABLET | Freq: Every day | ORAL | Status: DC

## 2013-06-12 MED ORDER — HYDRALAZINE HCL 20 MG/ML IJ SOLN
10.0000 mg | INTRAMUSCULAR | Status: DC | PRN
  Administered 2013-06-12: 10 mg via INTRAVENOUS
  Filled 2013-06-12: qty 1

## 2013-06-12 MED ORDER — FUROSEMIDE 10 MG/ML IJ SOLN: 40.0000 mg | Freq: Once | INTRAMUSCULAR | Status: DC

## 2013-06-12 MED ORDER — CARVEDILOL 25 MG PO TABS: 25.0000 mg | ORAL_TABLET | Freq: Two times a day (BID) | ORAL | Status: DC

## 2013-06-12 MED ORDER — AMLODIPINE BESYLATE 10 MG PO TABS: ORAL_TABLET | ORAL | Status: DC

## 2013-06-12 NOTE — Progress Notes (Signed)
Patient discharge teaching given, including activity, diet, follow-up appoints, and medications. Patient verbalized understanding of all discharge instructions. IV access was d/c'd. Vitals are stable. Skin is intact except as charted in most recent assessments. Pt to be escorted out by family, to be driven home by family.  Jillyn Ledger, MBA, BS, RN

## 2013-06-12 NOTE — Progress Notes (Signed)
Subjective: Pt had no acute events overnight. Able to wean off NTG gtt and on RA with good O2 sats. Wanting to go home.   Objective: Vital signs in last 24 hours: Filed Vitals:   06/12/13 0013 06/12/13 0100 06/12/13 0119 06/12/13 0530  BP: 211/104 180/102 178/96 195/105  Pulse: 102 93 95 93  Temp: 98.3 F (36.8 C)   98.8 F (37.1 C)  TempSrc: Oral   Oral  Resp: 23 22 21 23   Height:      Weight:      SpO2: 90% 93% 95% 94%   Weight change:   Intake/Output Summary (Last 24 hours) at 06/12/13 0746 Last data filed at 06/12/13 0700  Gross per 24 hour  Intake     42 ml  Output   4400 ml  Net  -4358 ml   Constitutional: He is oriented to person, place, and time.  Head: Normocephalic and atraumatic.  Eyes: EOM are normal. Pupils are equal, round, and reactive to light.  Cardiovascular: Normal rate, regular rhythm and intact distal pulses.  No murmur heard. Tachycardic  Pulmonary/Chest: Effort normal. He exhibits no tenderness. Crackles at bases and scattered end-expiratory wheezing. No rales or rhonchi Abdominal: Soft. Bowel sounds are normal. He exhibits . There is no tenderness. There is no rebound and no guarding.  Musculoskeletal: He exhibits  +2/3 pitting edema to knees.  Neurological: He is alert and oriented to person, place, and time. No cranial nerve deficit.  Skin: Skin is warm and dry.  Psychiatric: Affect normal.    Lab Results: Basic Metabolic Panel:  Recent Labs Lab 06/11/13 0500 06/12/13 0430  NA 142 138  K 4.0 3.3*  CL 103 101  CO2 25 25  GLUCOSE 70 101*  BUN 44* 45*  CREATININE 4.76* 4.43*  CALCIUM 8.1* 8.8  MG 1.4* 1.7   CBC:  Recent Labs Lab 06/10/13 1924 06/11/13 0500 06/12/13 0430  WBC 11.3* 10.1 9.2  NEUTROABS 7.3  --   --   HGB 10.5* 10.0* 10.3*  HCT 30.7* 29.5* 29.9*  MCV 97.5 98.3 96.8  PLT 230 226 247   Cardiac Enzymes:  Recent Labs Lab 06/10/13 1924 06/11/13 0140 06/11/13 0955  TROPONINI <0.30 <0.30 <0.30    Micro  Results: Recent Results (from the past 240 hour(s))  MRSA PCR SCREENING     Status: None   Collection Time    06/10/13 11:00 PM      Result Value Range Status   MRSA by PCR NEGATIVE  NEGATIVE Final   Comment:            The GeneXpert MRSA Assay (FDA     approved for NASAL specimens     only), is one component of a     comprehensive MRSA colonization     surveillance program. It is not     intended to diagnose MRSA     infection nor to guide or     monitor treatment for     MRSA infections.   Studies/Results: Dg Chest Portable 1 View  06/10/2013   CLINICAL DATA:  Shortness of breath  EXAM: PORTABLE CHEST - 1 VIEW  COMPARISON:  05/04/2009  FINDINGS: Heart size upper normal to mildly enlarged. Perihilar vascular congestion. Bilateral perihilar hazy consolidation with air bronchograms on the left. Small right pleural effusion.  IMPRESSION: Findings most suggestive of acute pulmonary edema.   Electronically Signed   By: Skipper Cliche M.D.   On: 06/10/2013 20:07   Medications: I have  reviewed the patient's current medications. Scheduled Meds: . amLODipine  10 mg Oral Daily  . carvedilol  12.5 mg Oral BID WC  . Darunavir Ethanolate  800 mg Oral Q breakfast  . fluticasone  2 spray Each Nare Daily  . furosemide  40 mg Oral Q breakfast  . heparin  5,000 Units Subcutaneous Q8H  . lamiVUDine  100 mg Oral Daily  . lisinopril  40 mg Oral Daily  . pravastatin  40 mg Oral q1800  . raltegravir  400 mg Oral BID  . ritonavir  100 mg Oral Q breakfast  . sodium chloride  3 mL Intravenous Q12H  . sodium chloride  3 mL Intravenous Q12H  . zidovudine  300 mg Oral BID   Continuous Infusions: . nitroGLYCERIN 5 mcg/min (06/12/13 0119)   PRN Meds:.acetaminophen, acetaminophen, HYDROcodone-acetaminophen, ipratropium, levalbuterol, ondansetron (ZOFRAN) IV, ondansetron Assessment/Plan: Principal Problem:   Hypertensive emergency Active Problems:   HIV DISEASE   MACROCYTIC ANEMIA   HYPERTENSION    CKD (chronic kidney disease) stage 3, GFR 30-59 ml/min   Acute respiratory failure   Acute pulmonary edema  Assessment Mr. Stille is a 47 year old male with PMH of uncontrolled HTN, CKD, HIV who is admitted for acute respiratory failure secondary to pulmonary edema in the setting of hypertensive emergency.    Plan:  Hypertensive emergency complicated by acute pulmonary edema and AKI: Pt initial pressures 200s/110s and in respiratory distress. Required bipap and NTG gtt. Both successfully weaned this AM and pt O2 in 96s and BP in 180s/90s. ACS ruled out with negative trops and normal ECHO.  -Monitor BP closely -Start home  amlodipine 10 mg , carvedilol 12.5 mg BID, lisinopril 40 mg - Continue home ASA daily  Acute on Chronic Kidney Disease Stage 3 Pt with est GFR for the last year 38-45. Creatine has steadily trended up over the past 2 years. On admission creatinine was found to be baseline 3.0>> 4.61.  Patient was supposed to see Dr. Moshe Cipro in November unclear if he kept this appointment at this time.  -Continue diuretic therapy  HIV disease - stable. Last CD 4 count 600/VL<20 (Sept 2014), follows with [name] at [place] [facility] Darunavir, Raltegravir, lamivudine, ritonavir, zidovudine   Diet: Heart  DVT Ppx: LMW heparin  Code: Full   Dispo: Disposition is deferred at this time, awaiting improvement of current medical problems.  Anticipated discharge in approximately 1 day.   The patient does have a current PCP (Lesly Dukes, MD) and does need an [facility] hospital follow-up appointment after discharge.  The patient does not have transportation limitations that hinder transportation to clinic appointments.  .Services Needed at time of discharge: Y = Yes, Blank = No PT:   OT:   RN:   Equipment:   Other:     LOS: 2 days   Clinton Gallant, MD 06/12/2013, 7:46 AM

## 2013-06-12 NOTE — Progress Notes (Signed)
Internal Medicine Attending  Date: 06/12/2013  Patient name: Darrell Johnson Medical record number: [id number] Date of birth: 1965-09-29 Age: 47 y.o. Gender: male  I saw and evaluated the patient. I reviewed the resident's note by Dr. Algis Liming and I agree with the resident's findings and plans as documented in her progress note.  Mr. Ponting is resting comfortably in bed on room air.  His BP is approximately 99991111 systolic which is near our goal of 99991111 systolic.  Examination is remarkable for continued end-inspiratory squeaks, but this is improved from yesterday.  His renal failure is slightly improved today with better control of his hypertension and cardiac function.  Echo was obtained this AM and is pending at the time of this note.  I agree with increasing the carvedilol dose while keeping his other medications unchanged.  He can be discharged home today with follow-up in 2 days in the [facility] to re-evaluate his BP and his renal function.  Further titration of his antihypertensives can be done as an outpatient with the goal of eventually lowering the BP < 140/90.  The importance of compliance with his antihypertensives was stressed to Mr. Mcclaran and he assured me he would pick up all of his antihypertensives today upon discharge.

## 2013-06-12 NOTE — Discharge Summary (Signed)
Name: Darrell Johnson MRN: [id number] DOB: Jan 23, 1966 47 y.o. PCP: Lesly Dukes, MD  Date of Admission: 06/10/2013  7:20 PM Date of Discharge: 06/12/2013 Attending Physician: Karren Cobble, MD  Discharge Diagnosis: Principal Problem:   Hypertensive emergency Active Problems:   HIV DISEASE   MACROCYTIC ANEMIA   HYPERTENSION   CKD (chronic kidney disease) stage 3, GFR 30-59 ml/min   Acute respiratory failure   Acute pulmonary edema  Discharge Medications:   Medication List    STOP taking these medications       ibuprofen 200 MG tablet  Commonly known as:  ADVIL,MOTRIN     THERAFLU FLU/COLD PO      TAKE these medications       amLODipine 10 MG tablet  Commonly known as:  NORVASC  TAKE ONE (1) TABLET EACH DAY     amLODipine 10 MG tablet  Commonly known as:  NORVASC  Take 10 mg by mouth daily.     carvedilol 25 MG tablet  Commonly known as:  COREG  Take 1 tablet (25 mg total) by mouth 2 (two) times daily with a meal.     cetirizine 10 MG tablet  Commonly known as:  ZYRTEC  Take 10 mg by mouth daily as needed for allergies or rhinitis.     colchicine 0.6 MG tablet  Take 0.6 mg by mouth every other day.     Darunavir Ethanolate 800 MG tablet  Commonly known as:  PREZISTA  Take 1 tablet (800 mg total) by mouth daily with breakfast.     febuxostat 40 MG tablet  Commonly known as:  ULORIC  Take 40 mg by mouth daily.     furosemide 40 MG tablet  Commonly known as:  LASIX  Take 40 mg by mouth daily.     furosemide 40 MG tablet  Commonly known as:  LASIX  Take 1 tablet (40 mg total) by mouth daily with breakfast.     HYDROcodone-acetaminophen 10-325 MG per tablet  Commonly known as:  NORCO  Take 1 tablet by mouth every 8 (eight) hours as needed for pain.     ISENTRESS 400 MG tablet  Generic drug:  raltegravir  TAKE ONE TABLET TWICE DAILY     lamivudine 100 MG tablet  Commonly known as:  EPIVIR  Take 1 tablet (100 mg total) by mouth daily.     lisinopril 40 MG tablet  Commonly known as:  PRINIVIL,ZESTRIL  Take 1 tablet (40 mg total) by mouth daily.     lisinopril 40 MG tablet  Commonly known as:  PRINIVIL,ZESTRIL  Take 1 tablet (40 mg total) by mouth daily.     MUCINEX DM PO  Take 1 tablet by mouth 2 (two) times daily as needed (chest congestion).     pravastatin 40 MG tablet  Commonly known as:  PRAVACHOL  Take 1 tablet (40 mg total) by mouth daily at 6 PM.     ritonavir 100 MG Tabs tablet  Commonly known as:  NORVIR  Take 1 tablet (100 mg total) by mouth daily with breakfast.     zidovudine 300 MG tablet  Commonly known as:  RETROVIR  Take 1 tablet (300 mg total) by mouth 2 (two) times daily.        Disposition and follow-up:   Darrell Johnson was discharged from [facility] in Stable condition.  At the hospital follow up visit please address:  1.  HTN management and titration along with medication  compliance  2.  Labs / imaging needed at time of follow-up: BMet for Cr   3.  Pending labs/ test needing follow-up: anemia panel  Follow-up Appointments:   Discharge Instructions:     Discharge Orders   Future Appointments Provider Department Dept Phone   07/18/2013 2:00 PM [facility] [phone]   08/03/2013 2:15 PM Lesly Dukes, MD [place] [phone]   08/04/2013 11:15 AM Carlyle Basques, MD [facility] [phone]   Future Orders Complete By Expires   Diet - low sodium heart healthy  As directed    Increase activity slowly  As directed       Consultations:    Procedures Performed:  Dg Chest Portable 1 View  06/10/2013   CLINICAL DATA:  Shortness of breath  EXAM: PORTABLE CHEST - 1 VIEW  COMPARISON:  05/04/2009  FINDINGS: Heart size upper normal to mildly enlarged. Perihilar vascular congestion. Bilateral perihilar hazy consolidation with air bronchograms on the  left. Small right pleural effusion.  IMPRESSION: Findings most suggestive of acute pulmonary edema.   Electronically Signed   By: Skipper Cliche M.D.   On: 06/10/2013 20:07    2D Echo: Study Conclusions  - Left ventricle: The cavity size was normal. Systolic function was normal. The estimated ejection fraction was in the range of 55% to 60%. Wall motion was normal; there were no regional wall motion abnormalities. Left ventricular diastolic function parameters were normal. - Mitral valve: Mild regurgitation. - Pulmonary arteries: Systolic pressure was moderately increased. PA peak pressure: 53mm Hg (S).   Admission HPI: Darrell Johnson is a 47 year old African American male with a PMH of poorly controlled hypertension, HLD, CKD (baseline ~2.5), HIV, HLD, Seizures. He was brought to the [facility] today by his nephew after his nephew visited him today and found him acutely SOB. Patient reports that his has had worsening SOB over the past 2-3 days. In addition he reports worsening orthopnea, a mild nonproductive cough, and increased swelling of his B/L lower extremities. He believes he may have had a recent sinus infection but reports that has mostly resolved. He endorsed some sternal chest pain which occurred earlier today that lasted for 30 minutes, this pain was non-radiating and not exertional or relieved by rest. He reports that he has difficultly with his mail order pharmacy in getting his medications on time. He was out of many of his medications (although he is not exactly sure which medications). He reports that he did receive his medications 2 days ago and resumed taking his medications. He attributes his lower extremity swelling to his resumption of amlodipine but does admit that this time the swelling is worse than previous. He does admit some mild nausea and a HA after nitroglycerine drip was started in the ED, he has not vomited. He denies any palpitations, fever, chills, abdominal pain. He  denies any recent sick contacts.   Hospital Course by problem list:  Hypertensive emergency complicated by acute respiratory failure 2/2 acute pulmonary edema and AKI- Patient presented with acute SOB, leg swelling, found to have acute pulmonary edema on CXR without history of CHF. His BNP was elevated at 5895. On presentation patient was found to have BP of 216/113, hypertensive emergency is thought to be likely cause of patient's acute heart failure and pulmonary edema. Patient did have an echo in Feb 2014 that showed normal EF mild to moderate MR and repeat  ECHO with similar findings this admission.  UDS was negative for cocaine, patient reports only occasional alcohol use. Arrhythmia/ACS less likely as serial EKGs shows sinus tachycardia without new changes when compared to previous and troponin were negative. Patient has no history of thyroid dysfunction. PE felt to be less likely as Wells Score is 1.5 (due to tachycardia). Anemia is a potential cause but less likely given patient has only a mild anemia. Infectious causes considered however patient has no fever or leukocytosis.Pt was started on Nitro drip started in ED to reduce BP. Patient placed on BIPAP in ED, with FiO2 of 50% pt was able to successfully wean to RA with good O2 sats in 96s. He was restarted on all his home meds with only increase in carvedilol to 25mg  BID.   Acute on Chronic Kidney Disease Stage 3 - Pt with est GFR for the last year 38-45. Creatine has steadily trended up over the past 2 years. On admission creatinine was found to be 4.61 baseline of 3.0. Patient was supposed to see Dr. Moshe Cipro in November unclear if he kept this appointment at this time.   HIV disease - stable. Last CD 4 count 600/VL<20 (Sept 2014), follows with [name] [facility] He was continued on home Darunavir, Raltegravir, lamivudine, ritonavir, zidovudine   Normocytic Anemia - stable, asymptomatic. Pt with history of macrocytic anemia. Pt with Hg of  10.5 on admission with MCV of 97.5. No reports of bleeding. Anemia panel was pending and to be followed up outpt.   Discharge Vitals:   BP 199/116  Pulse 97  Temp(Src) 99.2 F (37.3 C) (Oral)  Resp 22  Ht 5\' 10"  (1.778 m)  Wt 254 lb 13.6 oz (115.6 kg)  BMI 36.57 kg/m2  SpO2 96% Constitutional: He is oriented to person, place, and time.  Head: Normocephalic and atraumatic.  Eyes: EOM are normal. Pupils are equal, round, and reactive to light.  Cardiovascular: Normal rate, regular rhythm and intact distal pulses.  No murmur heard. Tachycardic  Pulmonary/Chest: Effort normal. He exhibits no tenderness. Crackles at bases and scattered end-expiratory wheezing. No rales or rhonchi  Abdominal: Soft. Bowel sounds are normal. He exhibits . There is no tenderness. There is no rebound and no guarding.  Musculoskeletal: He exhibits +2/3 pitting edema to knees.  Neurological: He is alert and oriented to person, place, and time. No cranial nerve deficit.  Skin: Skin is warm and dry.  Psychiatric: Affect normal.   Discharge Labs:  Results for orders placed during the hospital encounter of 06/10/13 (from the past 24 hour(s))  RETICULOCYTES     Status: Abnormal   Collection Time    06/12/13  4:30 AM      Result Value Range   Retic Ct Pct 1.9  0.4 - 3.1 %   RBC. 3.09 (*) 4.22 - 5.81 MIL/uL   Retic Count, Manual 58.7  19.0 - 186.0 K/uL  CBC     Status: Abnormal   Collection Time    06/12/13  4:30 AM      Result Value Range   WBC 9.2  4.0 - 10.5 K/uL   RBC 3.09 (*) 4.22 - 5.81 MIL/uL   Hemoglobin 10.3 (*) 13.0 - 17.0 g/dL   HCT 29.9 (*) 39.0 - 52.0 %   MCV 96.8  78.0 - 100.0 fL   MCH 33.3  26.0 - 34.0 pg   MCHC 34.4  30.0 - 36.0 g/dL   RDW 14.4  11.5 - 15.5 %  Platelets 247  150 - 400 K/uL  MAGNESIUM     Status: None   Collection Time    06/12/13  4:30 AM      Result Value Range   Magnesium 1.7  1.5 - 2.5 mg/dL  BASIC METABOLIC PANEL     Status: Abnormal   Collection Time     06/12/13  4:30 AM      Result Value Range   Sodium 138  135 - 145 mEq/L   Potassium 3.3 (*) 3.5 - 5.1 mEq/L   Chloride 101  96 - 112 mEq/L   CO2 25  19 - 32 mEq/L   Glucose, Bld 101 (*) 70 - 99 mg/dL   BUN 45 (*) 6 - 23 mg/dL   Creatinine, Ser 4.43 (*) 0.50 - 1.35 mg/dL   Calcium 8.8  8.4 - 10.5 mg/dL   GFR calc non Af Amer 14 (*) >90 mL/min   GFR calc Af Amer 17 (*) >90 mL/min    Signed: Clinton Gallant, MD 06/12/2013, 12:12 PM   Time Spent on Discharge: 35 minutes Services Ordered on Discharge: none Equipment Ordered on Discharge: none

## 2013-06-12 NOTE — Progress Notes (Signed)
  Echocardiogram 2D Echocardiogram has been performed.  Darrell Johnson 06/12/2013, 8:54 AM

## 2013-06-12 NOTE — Discharge Summary (Signed)
Please note that the discharge medication list is meant include only one of each drug.  Also note that the patient will [facility] Tuesday November 25th for a BP and BMP check.  Further titration of his antihypertensive regimen can be done at that time.

## 2013-06-13 NOTE — Progress Notes (Signed)
UR completed. Ruchama Kubicek RN CCM Case Mgmt 

## 2013-06-14 ENCOUNTER — Other Ambulatory Visit: Payer: Self-pay | Admitting: Internal Medicine

## 2013-06-15 ENCOUNTER — Ambulatory Visit (INDEPENDENT_AMBULATORY_CARE_PROVIDER_SITE_OTHER): Payer: Medicare Other | Admitting: Internal Medicine

## 2013-06-15 ENCOUNTER — Encounter: Payer: Self-pay | Admitting: Internal Medicine

## 2013-06-15 VITALS — BP 179/108 | HR 93 | Temp 98.3°F | Resp 20 | Ht 69.0 in | Wt 253.2 lb

## 2013-06-15 DIAGNOSIS — N183 Chronic kidney disease, stage 3 unspecified: Secondary | ICD-10-CM

## 2013-06-15 DIAGNOSIS — M109 Gout, unspecified: Secondary | ICD-10-CM | POA: Diagnosis not present

## 2013-06-15 DIAGNOSIS — I10 Essential (primary) hypertension: Secondary | ICD-10-CM

## 2013-06-15 DIAGNOSIS — I129 Hypertensive chronic kidney disease with stage 1 through stage 4 chronic kidney disease, or unspecified chronic kidney disease: Secondary | ICD-10-CM

## 2013-06-15 DIAGNOSIS — B20 Human immunodeficiency virus [HIV] disease: Secondary | ICD-10-CM | POA: Diagnosis not present

## 2013-06-15 DIAGNOSIS — D631 Anemia in chronic kidney disease: Secondary | ICD-10-CM | POA: Diagnosis not present

## 2013-06-15 DIAGNOSIS — N184 Chronic kidney disease, stage 4 (severe): Secondary | ICD-10-CM | POA: Diagnosis not present

## 2013-06-15 DIAGNOSIS — Z21 Asymptomatic human immunodeficiency virus [HIV] infection status: Secondary | ICD-10-CM | POA: Diagnosis not present

## 2013-06-15 NOTE — Assessment & Plan Note (Signed)
BP Readings from Last 3 Encounters:  06/15/13 179/108  06/12/13 191/113  04/27/13 160/100    Lab Results  Component Value Date   NA 138 06/12/2013   K 3.3* 06/12/2013   CREATININE 4.43* 06/12/2013    Assessment: Blood pressure control: moderately elevated Progress toward BP goal:  unchanged Comments: down from when he was in the hospital but far from goal  Plan: Medications:  continue current medications, coreg 25 mg bid, lisinopril 40 mg daily, lasix 40 mg daily, amlodipine 10 mg daily Educational resources provided: brochure Self management tools provided: home blood pressure logbook Other plans: suspect he is not taking coreg as his HR is still 93 at today's visit, if he is taking then it has room for titration upwards

## 2013-06-15 NOTE — Progress Notes (Signed)
Case discussed with Dr. Kollar at the time of the visit.  We reviewed the resident's history and exam and pertinent patient test results.  I agree with the assessment, diagnosis, and plan of care documented in the resident's note.     

## 2013-06-15 NOTE — Progress Notes (Signed)
Subjective:     Patient ID: Darrell Johnson, male   DOB: [birth year], 47 y.o.   MRN: [id number]  HPI The patient is a 47 YO man who is coming in for a hospital follow up today. He was hospitalized for hypertension and non-compliance with medications. He also has PMH of HIV, CKD stage III. He states that since leaving the hospital he has discontinued the theraflu and other things that he was taking that were thought to have raised his BP. He was unable to get his medicines since leaving the hospital though admits to taking 2 blood pressure medicines. He states that his insurance will pay for them on the 1st although his pharmacy has called and stated that they are ready to pick up and he may try to get them today. He is not having headaches, fevers, chills, abdominal pain, chest pain. No SOB.   Review of Systems  Constitutional: Negative for fever, chills, diaphoresis, activity change, appetite change, fatigue and unexpected weight change.  HENT: Positive for congestion.   Respiratory: Negative for cough, chest tightness, shortness of breath and wheezing.   Cardiovascular: Negative for chest pain, palpitations and leg swelling.  Gastrointestinal: Negative for nausea, vomiting, abdominal pain, diarrhea and constipation.  Neurological: Negative for dizziness, tremors, seizures, syncope, facial asymmetry, speech difficulty, weakness, light-headedness, numbness and headaches.       Objective:   Physical Exam  Vitals reviewed. Constitutional: He is oriented to person, place, and time. He appears well-developed and well-nourished. No distress.  Obese  HENT:  Head: Normocephalic and atraumatic.  Eyes: EOM are normal. Pupils are equal, round, and reactive to light.  Neck: Normal range of motion. Neck supple. No JVD present. No tracheal deviation present. No thyromegaly present.  Cardiovascular: Normal rate and regular rhythm.   No murmur heard. Pulmonary/Chest: Effort normal and breath sounds  normal. No respiratory distress. He has no wheezes. He has no rales.  Abdominal: Soft. Bowel sounds are normal. He exhibits no distension. There is no tenderness. There is no rebound.  Musculoskeletal: Normal range of motion. He exhibits no edema and no tenderness.  Neurological: He is alert and oriented to person, place, and time.  Skin: Skin is warm and dry. He is not diaphoretic.       Assessment/Plan:   1. Please see problem oriented charting.  2. Disposition - The patient will be seen back in 2-4 weeks for BP recheck and titration. Doubt he is taking coreg as his HR is still 93. If he is taking it then it can be increased at next visit. BMP checked today per discharge recommendations. Patient advised he can safely take zyrtec and recommended that he restart that for the allergen of perfume at his workplace.

## 2013-06-15 NOTE — Assessment & Plan Note (Signed)
Patient was unsure what medications he was taking. He knows he is taking prezista and isentress. He is unclear whether he is taking the combination combivir or he is taking the separate epivir and retrovir.

## 2013-06-15 NOTE — Patient Instructions (Addendum)
We would like you to keep taking your blood pressure medicines and pick them up from the pharmacy today.   We are going to see you back in 2-4 weeks to check on your pressures. Work on decreasing the amount of salt you are eating.   Call us if you have any problems getting your medicines at [phone].  2 Gram Low Sodium Diet A 2 gram sodium diet restricts the amount of sodium in the diet to no more than 2 g or 2000 mg daily. Limiting the amount of sodium is often used to help lower blood pressure. It is important if you have heart, liver, or kidney problems. Many foods contain sodium for flavor and sometimes as a preservative. When the amount of sodium in a diet needs to be low, it is important to know what to look for when choosing foods and drinks. The following includes some information and guidelines to help make it easier for you to adapt to a low sodium diet. QUICK TIPS  Do not add salt to food.  Avoid convenience items and fast food.  Choose unsalted snack foods.  Buy lower sodium products, often labeled as "lower sodium" or "no salt added."  Check food labels to learn how much sodium is in 1 serving.  When eating at a restaurant, ask that your food be prepared with less salt or none, if possible. READING FOOD LABELS FOR SODIUM INFORMATION The nutrition facts label is a good place to find how much sodium is in foods. Look for products with no more than 500 to 600 mg of sodium per meal and no more than 150 mg per serving. Remember that 2 g = 2000 mg. The food label may also list foods as:  Sodium-free: Less than 5 mg in a serving.  Very low sodium: 35 mg or less in a serving.  Low-sodium: 140 mg or less in a serving.  Light in sodium: 50% less sodium in a serving. For example, if a food that usually has 300 mg of sodium is changed to become light in sodium, it will have 150 mg of sodium.  Reduced sodium: 25% less sodium in a serving. For example, if a food that usually has  400 mg of sodium is changed to reduced sodium, it will have 300 mg of sodium. CHOOSING FOODS Grains  Avoid: Salted crackers and snack items. Some cereals, including instant hot cereals. Bread stuffing and biscuit mixes. Seasoned rice or pasta mixes.  Choose: Unsalted snack items. Low-sodium cereals, oats, puffed wheat and rice, shredded wheat. English muffins and bread. Pasta. Meats  Avoid: Salted, canned, smoked, spiced, pickled meats, including fish and poultry. Bacon, ham, sausage, cold cuts, hot dogs, anchovies.  Choose: Low-sodium canned tuna and salmon. Fresh or frozen meat, poultry, and fish. Dairy  Avoid: Processed cheese and spreads. Cottage cheese. Buttermilk and condensed milk. Regular cheese.  Choose: Milk. Low-sodium cottage cheese. Yogurt. Sour cream. Low-sodium cheese. Fruits and Vegetables  Avoid: Regular canned vegetables. Regular canned tomato sauce and paste. Frozen vegetables in sauces. Olives. Angie Fava. Relishes. Sauerkraut.  Choose: Low-sodium canned vegetables. Low-sodium tomato sauce and paste. Frozen or fresh vegetables. Fresh and frozen fruit. Condiments  Avoid: Canned and packaged gravies. Worcestershire sauce. Tartar sauce. Barbecue sauce. Soy sauce. Steak sauce. Ketchup. Onion, garlic, and table salt. Meat flavorings and tenderizers.  Choose: Fresh and dried herbs and spices. Low-sodium varieties of mustard and ketchup. Lemon juice. Tabasco sauce. Horseradish. SAMPLE 2 GRAM SODIUM MEAL PLAN Breakfast / Sodium (mg)  1 cup low-fat milk / [id number] mg  2 slices whole-wheat toast / 270 mg  1 tbs heart-healthy margarine / 153 mg  1 hard-boiled egg / 139 mg  1 small orange / 0 mg Lunch / Sodium (mg)  1 cup raw carrots / 76 mg   cup hummus / 298 mg  1 cup low-fat milk / 143 mg   cup red grapes / 2 mg  1 whole-wheat pita bread / 356 mg Dinner / Sodium (mg)  1 cup whole-wheat pasta / 2 mg  1 cup low-sodium tomato sauce / 73 mg  3 oz lean ground  beef / 57 mg  1 small side salad (1 cup raw spinach leaves,  cup cucumber,  cup yellow bell pepper) with 1 tsp olive oil and 1 tsp red wine vinegar / 25 mg Snack / Sodium (mg)  1 container low-fat vanilla yogurt / 107 mg  3 graham cracker squares / 127 mg Nutrient Analysis  Calories: 2033  Protein: 77 g  Carbohydrate: 282 g  Fat: 72 g  Sodium: 1971 mg Document Released: 07/07/2005 Document Revised: 09/29/2011 Document Reviewed: 10/08/2009 [employer] Patient Information 2014 [place], [place].

## 2013-06-15 NOTE — Assessment & Plan Note (Signed)
Checking BMP today but informed patient that if his blood pressure is not controlled he will be hurting his kidneys.

## 2013-06-16 LAB — BASIC METABOLIC PANEL WITH GFR
BUN: 49 mg/dL — ABNORMAL HIGH (ref 6–23)
Chloride: 105 mEq/L (ref 96–112)
GFR, Est African American: 17 mL/min — ABNORMAL LOW
GFR, Est Non African American: 14 mL/min — ABNORMAL LOW
Potassium: 4.4 mEq/L (ref 3.5–5.3)

## 2013-06-22 ENCOUNTER — Other Ambulatory Visit: Payer: Self-pay | Admitting: Infectious Diseases

## 2013-07-06 ENCOUNTER — Ambulatory Visit (INDEPENDENT_AMBULATORY_CARE_PROVIDER_SITE_OTHER): Payer: Medicare Other | Admitting: Internal Medicine

## 2013-07-06 ENCOUNTER — Inpatient Hospital Stay (HOSPITAL_COMMUNITY): Payer: Medicare Other

## 2013-07-06 ENCOUNTER — Encounter (HOSPITAL_COMMUNITY): Payer: Self-pay | Admitting: General Practice

## 2013-07-06 ENCOUNTER — Encounter: Payer: Self-pay | Admitting: Internal Medicine

## 2013-07-06 ENCOUNTER — Inpatient Hospital Stay (HOSPITAL_COMMUNITY)
Admission: AD | Admit: 2013-07-06 | Discharge: 2013-07-10 | DRG: 682 | Disposition: A | Discharge status: Home / Self Care | Payer: Medicare Other | Source: Ambulatory Visit | Attending: Internal Medicine | Admitting: Internal Medicine

## 2013-07-06 VITALS — BP 197/114 | HR 108 | Temp 98.5°F | Ht 69.0 in | Wt 263.1 lb

## 2013-07-06 DIAGNOSIS — E785 Hyperlipidemia, unspecified: Secondary | ICD-10-CM | POA: Diagnosis present

## 2013-07-06 DIAGNOSIS — N184 Chronic kidney disease, stage 4 (severe): Secondary | ICD-10-CM | POA: Diagnosis not present

## 2013-07-06 DIAGNOSIS — J90 Pleural effusion, not elsewhere classified: Secondary | ICD-10-CM | POA: Diagnosis not present

## 2013-07-06 DIAGNOSIS — E46 Unspecified protein-calorie malnutrition: Secondary | ICD-10-CM | POA: Diagnosis present

## 2013-07-06 DIAGNOSIS — D638 Anemia in other chronic diseases classified elsewhere: Secondary | ICD-10-CM | POA: Diagnosis present

## 2013-07-06 DIAGNOSIS — I161 Hypertensive emergency: Secondary | ICD-10-CM

## 2013-07-06 DIAGNOSIS — R569 Unspecified convulsions: Secondary | ICD-10-CM | POA: Diagnosis present

## 2013-07-06 DIAGNOSIS — I129 Hypertensive chronic kidney disease with stage 1 through stage 4 chronic kidney disease, or unspecified chronic kidney disease: Secondary | ICD-10-CM | POA: Diagnosis present

## 2013-07-06 DIAGNOSIS — J069 Acute upper respiratory infection, unspecified: Secondary | ICD-10-CM | POA: Diagnosis present

## 2013-07-06 DIAGNOSIS — N179 Acute kidney failure, unspecified: Principal | ICD-10-CM | POA: Diagnosis present

## 2013-07-06 DIAGNOSIS — J811 Chronic pulmonary edema: Secondary | ICD-10-CM | POA: Diagnosis not present

## 2013-07-06 DIAGNOSIS — N183 Chronic kidney disease, stage 3 unspecified: Secondary | ICD-10-CM | POA: Diagnosis present

## 2013-07-06 DIAGNOSIS — I509 Heart failure, unspecified: Secondary | ICD-10-CM | POA: Diagnosis present

## 2013-07-06 DIAGNOSIS — I10 Essential (primary) hypertension: Secondary | ICD-10-CM | POA: Diagnosis present

## 2013-07-06 DIAGNOSIS — Z01810 Encounter for preprocedural cardiovascular examination: Secondary | ICD-10-CM | POA: Diagnosis not present

## 2013-07-06 DIAGNOSIS — M109 Gout, unspecified: Secondary | ICD-10-CM | POA: Diagnosis present

## 2013-07-06 DIAGNOSIS — R05 Cough: Secondary | ICD-10-CM | POA: Diagnosis not present

## 2013-07-06 DIAGNOSIS — B20 Human immunodeficiency virus [HIV] disease: Secondary | ICD-10-CM | POA: Diagnosis not present

## 2013-07-06 DIAGNOSIS — Z9119 Patient's noncompliance with other medical treatment and regimen: Secondary | ICD-10-CM

## 2013-07-06 DIAGNOSIS — F17200 Nicotine dependence, unspecified, uncomplicated: Secondary | ICD-10-CM | POA: Diagnosis present

## 2013-07-06 DIAGNOSIS — Z91199 Patient's noncompliance with other medical treatment and regimen due to unspecified reason: Secondary | ICD-10-CM

## 2013-07-06 DIAGNOSIS — D631 Anemia in chronic kidney disease: Secondary | ICD-10-CM | POA: Diagnosis not present

## 2013-07-06 LAB — CBC
HCT: 27 % — ABNORMAL LOW (ref 39.0–52.0)
MCH: 33.2 pg (ref 26.0–34.0)
MCV: 97.5 fL (ref 78.0–100.0)
RBC: 2.77 MIL/uL — ABNORMAL LOW (ref 4.22–5.81)
WBC: 10.2 10*3/uL (ref 4.0–10.5)

## 2013-07-06 LAB — COMPREHENSIVE METABOLIC PANEL
AST: 21 U/L (ref 0–37)
Alkaline Phosphatase: 118 U/L — ABNORMAL HIGH (ref 39–117)
BUN: 65 mg/dL — ABNORMAL HIGH (ref 6–23)
CO2: 30 mEq/L (ref 19–32)
Calcium: 7.9 mg/dL — ABNORMAL LOW (ref 8.4–10.5)
Chloride: 98 mEq/L (ref 96–112)
Creatinine, Ser: 5.28 mg/dL — ABNORMAL HIGH (ref 0.50–1.35)
GFR calc Af Amer: 14 mL/min — ABNORMAL LOW (ref >90)
GFR calc non Af Amer: 12 mL/min — ABNORMAL LOW (ref >90)
Total Bilirubin: 0.1 mg/dL — ABNORMAL LOW (ref 0.3–1.2)

## 2013-07-06 LAB — BASIC METABOLIC PANEL
BUN: 64 mg/dL — ABNORMAL HIGH (ref 6–23)
Calcium: 8.2 mg/dL — ABNORMAL LOW (ref 8.4–10.5)
Creatinine, Ser: 5.28 mg/dL — ABNORMAL HIGH (ref 0.50–1.35)
GFR calc non Af Amer: 12 mL/min — ABNORMAL LOW (ref >90)
Glucose, Bld: 78 mg/dL (ref 70–99)
Potassium: 3.3 mEq/L — ABNORMAL LOW (ref 3.5–5.1)

## 2013-07-06 LAB — RAPID URINE DRUG SCREEN, HOSP PERFORMED
Barbiturates: NOT DETECTED
Benzodiazepines: NOT DETECTED
Cocaine: NOT DETECTED

## 2013-07-06 LAB — TROPONIN I: Troponin I: <0.3 ng/mL (ref <0.30)

## 2013-07-06 LAB — URINALYSIS, ROUTINE W REFLEX MICROSCOPIC
Bilirubin Urine: NEGATIVE
Leukocytes, UA: NEGATIVE
Nitrite: NEGATIVE
Protein, ur: >300 mg/dL — AB
Specific Gravity, Urine: 1.013 (ref 1.005–1.030)
Urobilinogen, UA: 0.2 mg/dL (ref 0.0–1.0)
pH: 7.5 (ref 5.0–8.0)

## 2013-07-06 LAB — URINE MICROSCOPIC-ADD ON

## 2013-07-06 LAB — MRSA PCR SCREENING: MRSA by PCR: NEGATIVE

## 2013-07-06 LAB — PRO B NATRIURETIC PEPTIDE: Pro B Natriuretic peptide (BNP): 5924 pg/mL — ABNORMAL HIGH (ref 0–125)

## 2013-07-06 LAB — TSH: TSH: 1.214 u[IU]/mL (ref 0.350–4.500)

## 2013-07-06 MED ORDER — FUROSEMIDE 10 MG/ML IJ SOLN
80.0000 mg | Freq: Once | INTRAMUSCULAR | Status: AC
  Administered 2013-07-06: 80 mg via INTRAVENOUS

## 2013-07-06 MED ORDER — AMLODIPINE BESYLATE 10 MG PO TABS
10.0000 mg | ORAL_TABLET | Freq: Every day | ORAL | Status: DC
  Filled 2013-07-06: qty 1

## 2013-07-06 MED ORDER — RALTEGRAVIR POTASSIUM 400 MG PO TABS
400.0000 mg | ORAL_TABLET | Freq: Two times a day (BID) | ORAL | Status: DC
  Administered 2013-07-06 – 2013-07-10 (×8): 400 mg via ORAL
  Filled 2013-07-06 (×9): qty 1

## 2013-07-06 MED ORDER — ZIDOVUDINE 300 MG PO TABS
300.0000 mg | ORAL_TABLET | Freq: Two times a day (BID) | ORAL | Status: DC
  Administered 2013-07-06 – 2013-07-07 (×3): 300 mg via ORAL
  Filled 2013-07-06 (×7): qty 1

## 2013-07-06 MED ORDER — ACETAMINOPHEN 325 MG PO TABS
650.0000 mg | ORAL_TABLET | Freq: Four times a day (QID) | ORAL | Status: DC | PRN
  Administered 2013-07-06 – 2013-07-09 (×4): 650 mg via ORAL
  Filled 2013-07-06 (×4): qty 2

## 2013-07-06 MED ORDER — LISINOPRIL 40 MG PO TABS
40.0000 mg | ORAL_TABLET | Freq: Every day | ORAL | Status: DC
  Filled 2013-07-06: qty 1

## 2013-07-06 MED ORDER — RITONAVIR 100 MG PO TABS
100.0000 mg | ORAL_TABLET | Freq: Every day | ORAL | Status: DC
  Administered 2013-07-07 – 2013-07-10 (×4): 100 mg via ORAL
  Filled 2013-07-06 (×5): qty 1

## 2013-07-06 MED ORDER — SIMVASTATIN 20 MG PO TABS
20.0000 mg | ORAL_TABLET | Freq: Every day | ORAL | Status: DC
  Administered 2013-07-06: 20 mg via ORAL
  Filled 2013-07-06: qty 1

## 2013-07-06 MED ORDER — DARUNAVIR ETHANOLATE 800 MG PO TABS
800.0000 mg | ORAL_TABLET | Freq: Every day | ORAL | Status: DC
  Filled 2013-07-06: qty 1

## 2013-07-06 MED ORDER — FEBUXOSTAT 40 MG PO TABS
40.0000 mg | ORAL_TABLET | Freq: Every day | ORAL | Status: DC
  Administered 2013-07-06 – 2013-07-10 (×5): 40 mg via ORAL
  Filled 2013-07-06 (×5): qty 1

## 2013-07-06 MED ORDER — PRAVASTATIN SODIUM 40 MG PO TABS
40.0000 mg | ORAL_TABLET | Freq: Every day | ORAL | Status: DC
  Administered 2013-07-07 – 2013-07-09 (×3): 40 mg via ORAL
  Filled 2013-07-06 (×4): qty 1

## 2013-07-06 MED ORDER — ASPIRIN EC 81 MG PO TBEC
81.0000 mg | DELAYED_RELEASE_TABLET | Freq: Every day | ORAL | Status: DC
  Administered 2013-07-06 – 2013-07-10 (×5): 81 mg via ORAL
  Filled 2013-07-06 (×5): qty 1

## 2013-07-06 MED ORDER — SODIUM CHLORIDE 0.9 % IJ SOLN
3.0000 mL | Freq: Two times a day (BID) | INTRAMUSCULAR | Status: DC
  Administered 2013-07-06 – 2013-07-10 (×8): 3 mL via INTRAVENOUS

## 2013-07-06 MED ORDER — DARUNAVIR ETHANOLATE 800 MG PO TABS
800.0000 mg | ORAL_TABLET | Freq: Every day | ORAL | Status: DC
  Administered 2013-07-07 – 2013-07-10 (×4): 800 mg via ORAL
  Filled 2013-07-06 (×5): qty 1

## 2013-07-06 MED ORDER — LAMIVUDINE 10 MG/ML PO SOLN
100.0000 mg | Freq: Every day | ORAL | Status: DC
  Administered 2013-07-06 – 2013-07-10 (×5): 100 mg via ORAL
  Filled 2013-07-06 (×5): qty 10

## 2013-07-06 MED ORDER — SODIUM CHLORIDE 0.9 % IJ SOLN: 3.0000 mL | INTRAMUSCULAR | Status: DC | PRN

## 2013-07-06 MED ORDER — POTASSIUM CHLORIDE CRYS ER 20 MEQ PO TBCR: 20.0000 meq | EXTENDED_RELEASE_TABLET | Freq: Once | ORAL | Status: DC

## 2013-07-06 MED ORDER — LAMIVUDINE 100 MG PO TABS
100.0000 mg | ORAL_TABLET | Freq: Every day | ORAL | Status: DC
  Filled 2013-07-06: qty 1

## 2013-07-06 MED ORDER — NITROGLYCERIN IN D5W 200-5 MCG/ML-% IV SOLN
2.0000 ug/min | INTRAVENOUS | Status: DC
  Administered 2013-07-06: 10 ug/min via INTRAVENOUS

## 2013-07-06 MED ORDER — ONDANSETRON HCL 4 MG/2ML IJ SOLN
4.0000 mg | Freq: Four times a day (QID) | INTRAMUSCULAR | Status: DC | PRN
  Filled 2013-07-06: qty 2

## 2013-07-06 MED ORDER — LAMIVUDINE-ZIDOVUDINE 150-300 MG PO TABS
1.0000 | ORAL_TABLET | Freq: Two times a day (BID) | ORAL | Status: DC
  Filled 2013-07-06: qty 1

## 2013-07-06 MED ORDER — SODIUM CHLORIDE 0.9 % IV SOLN: 250.0000 mL | INTRAVENOUS | Status: DC | PRN

## 2013-07-06 MED ORDER — FUROSEMIDE 10 MG/ML IJ SOLN
40.0000 mg | Freq: Once | INTRAMUSCULAR | Status: AC
  Administered 2013-07-06: 40 mg via INTRAVENOUS
  Filled 2013-07-06: qty 4

## 2013-07-06 MED ORDER — POTASSIUM CHLORIDE CRYS ER 20 MEQ PO TBCR
40.0000 meq | EXTENDED_RELEASE_TABLET | Freq: Once | ORAL | Status: AC
  Administered 2013-07-06: 40 meq via ORAL

## 2013-07-06 MED ORDER — LEVALBUTEROL HCL 0.63 MG/3ML IN NEBU: 0.6300 mg | INHALATION_SOLUTION | Freq: Four times a day (QID) | RESPIRATORY_TRACT | Status: DC | PRN

## 2013-07-06 MED ORDER — HEPARIN SODIUM (PORCINE) 5000 UNIT/ML IJ SOLN
5000.0000 [IU] | Freq: Three times a day (TID) | INTRAMUSCULAR | Status: DC
  Administered 2013-07-06 – 2013-07-10 (×12): 5000 [IU] via SUBCUTANEOUS
  Filled 2013-07-06 (×15): qty 1

## 2013-07-06 MED ORDER — NITROGLYCERIN IN D5W 200-5 MCG/ML-% IV SOLN
INTRAVENOUS | Status: AC
  Filled 2013-07-06: qty 250

## 2013-07-06 MED ORDER — CARVEDILOL 25 MG PO TABS
25.0000 mg | ORAL_TABLET | Freq: Two times a day (BID) | ORAL | Status: DC
  Filled 2013-07-06 (×2): qty 1

## 2013-07-06 MED ORDER — ONDANSETRON HCL 4 MG PO TABS: 4.0000 mg | ORAL_TABLET | Freq: Four times a day (QID) | ORAL | Status: DC | PRN

## 2013-07-06 MED ORDER — NICARDIPINE HCL IN NACL 20-0.86 MG/200ML-% IV SOLN
5.0000 mg/h | INTRAVENOUS | Status: DC
  Administered 2013-07-06 – 2013-07-07 (×2): 5 mg/h via INTRAVENOUS
  Filled 2013-07-06 (×3): qty 200

## 2013-07-06 NOTE — Assessment & Plan Note (Addendum)
Filed Vitals:   07/06/13 1349  BP: 197/114  Pulse: 108  Temp: 98.5 F (36.9 C)   Patient presents today with shortness of breath and hypoxia with oxygen saturation 89% in the setting of hypertension to 197/114. His physical exam is notable for wheezing and crackles at the lung bases, as well as accessory muscle use. He also has some lower extremity edema, and reports symptoms similar to orthopnea at home. This likely represent hypertension emergency with acute pulmonary edema. He will need to be admitted to the hospital for blood pressure control, diuresis, heart failure rule out, and lab work to rule out acute kidney injury which has been an issue for him in the past. - Admit to IMTS, step down - Senior resident has been paged and will see the patient in clinic - We'll give 2 L oxygen via nasal cannula while he is waiting

## 2013-07-06 NOTE — Addendum Note (Signed)
Addended by: Carlisia Geno, Wynelle Bourgeois on: 07/06/2013 04:50 PM   Modules accepted: Level of Service

## 2013-07-06 NOTE — H&P (Signed)
Date: 07/06/2013               Patient Name:  Darrell Johnson MRN: [id number]  DOB: 1966/02/09 Age / Sex: 47 y.o., male   PCP: Lesly Dukes, MD         Medical Service: Internal Medicine Teaching Service         Attending Physician: Dr. Sid Falcon, MD    First Contact: Dr. Lum Babe, MD Pager: [phone]  Second Contact: Dr. Randell Loop, mD Pager: [phone]       After Hours (After 5p/  First Contact Pager: [phone]  weekends / holidays): Second Contact Pager: [phone]   Chief Complaint: Cough  History of Present Illness:  [name] is a 47 y.o. man with PMH of gout (bilateral knees), HTN, HLD, CKD, history of syphilis in 1997, HIV on HAART (CD4 600, VL<20 in 03/2013), who presents to the clinic for routine follow up.  Patient reports cough and shortness of breath for about one month, which he attributes to a cold. He tried taking Robitussin, NyQuil, Mucinex without relief but has not seen a doctor until today. His "cold" gets worse when he is lying flat on his back, so he has been sleeping propped up in bed for the past month. He's gained about 5 kg since his last visit. Denies worsening swelling in his legs. Denies chest pain, headache, weakness, numbness, abdominal pain, nausea, vomiting.   In 11/14 he was admitted to [facility] for hypertensive emergency complicated by acute respiratory failure 2/2 acute pulmonary edema and AKI. He says he feels worse today than he did at that time.  In terms of medications he should be taking Coreg 25 mg bid, lisinopril 40 mg daily, lasix 40 mg daily, amlodipine 10 mg daily. Compliance has been in question in the past as his heart rate is rarely beta blocked when he comes in for clinic visits. Today he says he is taking all his medications except for Lasix.   For his HIV he follows with [name], who will see him again in January. Viral load was undetectable at his last visit in September. He got a flu shot in her office.    Patient denies drug use and alcohol use.  Meds: No current facility-administered medications for this encounter.    Allergies: Allergies as of 07/06/2013  . (No Known Allergies)   Past Medical History  Diagnosis Date  . 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 Hypertension   . Seizures     last seizure was >5 years ago, pt has family history of seizures  . Syphilis 1997    history of syphilis 1997  . Chronic kidney disease     stage !! CKD, followed by Fr. Clover Mealy  . 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 Rib fractures 01/2009  . HIV infection 1980's    on ART therapy since, followed by ID clinic, complicated  by neuropathy  . 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 Family History  Problem Relation Age of Onset  . Cancer Mother   . COPD Father   . Diabetes Sister   . Hypertension Sister   . Diabetes Brother   . Hypertension Brother   . Stroke Neg Hx  History   Social History  . Marital Status: Married    Spouse Name: N/A    Number of Children: N/A  . Years of Education: N/A   Occupational History  . Not on file.   Social History Main Topics  . Smoking status: Current Every Day Smoker -- 0.30 packs/day for 20 years    Types: Cigarettes  . Smokeless tobacco: Never Used     Comment: Trying to cut back.  . Alcohol Use: No  . Drug Use: No  . Sexual Activity: Not on file   Other Topics Concern  . Not on file   Social History Narrative  . No narrative on file    Review of Systems: Constitutional: Negative for fever and chills.  HENT: Positive for postnasal drip.  Eyes: Negative for visual disturbance.  Respiratory: Positive for cough, shortness of breath and wheezing. Negative for chest tightness.  Cardiovascular: Negative for chest  pain, palpitations and leg swelling.  Gastrointestinal: Negative for nausea, abdominal pain and diarrhea.  Genitourinary: Negative for dysuria.  Skin: Negative for rash.  Neurological: Negative for dizziness, weakness, numbness and headaches.    Physical Exam: Temperature 98.5 F (36.9 C), temperature source Oral, height 5\' 10"  (1.778 m), weight 252 lb 3.3 oz (114.4 kg).  Physical Exam  Constitutional: He is oriented to person, place, and time. He appears well-developed and well-nourished. No distress.  HENT:  Head: Normocephalic and atraumatic.  Eyes: Conjunctivae and EOM are normal. Pupils are equal, round, and reactive to light.  Neck: Normal range of motion. Neck supple.  Cardiovascular: Regular rhythm. Tachycardia present.  No murmur heard.  Pulmonary/Chest: Accessory muscle usage present. He has wheezes (Worst in the anterior lung fields). He has rales (At the bases, right greater than left).  Abdominal: Soft. He exhibits distension. There is no tenderness.  Musculoskeletal: He exhibits edema (1+ pitting edema at the ankles).  Neurological: He is alert and oriented to person, place, and time.  Skin: Skin is warm and dry.   Lab results: Basic Metabolic Panel: No results found for this basename: NA, K, CL, CO2, GLUCOSE, BUN, CREATININE, CALCIUM, MG, PHOS,  in the last 72 hours Liver Function Tests: No results found for this basename: AST, ALT, ALKPHOS, BILITOT, PROT, ALBUMIN,  in the last 72 hours No results found for this basename: LIPASE, AMYLASE,  in the last 72 hours No results found for this basename: AMMONIA,  in the last 72 hours CBC: No results found for this basename: WBC, NEUTROABS, HGB, HCT, MCV, PLT,  in the last 72 hours Cardiac Enzymes: No results found for this basename: CKTOTAL, CKMB, CKMBINDEX, TROPONINI,  in the last 72 hours BNP: No results found for this basename: PROBNP,  in the last 72 hours D-Dimer: No results found for this basename: DDIMER,  in the  last 72 hours CBG: No results found for this basename: GLUCAP,  in the last 72 hours Hemoglobin A1C: No results found for this basename: HGBA1C,  in the last 72 hours Fasting Lipid Panel: No results found for this basename: CHOL, HDL, LDLCALC, TRIG, CHOLHDL, LDLDIRECT,  in the last 72 hours Thyroid Function Tests: No results found for this basename: TSH, T4TOTAL, FREET4, T3FREE, THYROIDAB,  in the last 72 hours Anemia Panel: No results found for this basename: VITAMINB12, FOLATE, FERRITIN, TIBC, IRON, RETICCTPCT,  in the last 72 hours Coagulation: No results found for this basename: LABPROT, INR,  in the last 72 hours Urine Drug Screen: Drugs of Abuse  Component Value Date/Time   LABOPIA POSITIVE* 06/10/2013 2357   LABOPIA PPS 11/12/2012 1155   COCAINSCRNUR NONE DETECTED 06/10/2013 2357   COCAINSCRNUR NEG 11/12/2012 1155   LABBENZ NONE DETECTED 06/10/2013 2357   LABBENZ NEG 11/12/2012 1155   AMPHETMU NONE DETECTED 06/10/2013 2357   THCU NONE DETECTED 06/10/2013 2357   LABBARB NONE DETECTED 06/10/2013 2357   LABBARB NEG 11/12/2012 1155    Alcohol Level: No results found for this basename: ETH,  in the last 72 hours Urinalysis: No results found for this basename: COLORURINE, APPERANCEUR, LABSPEC, PHURINE, GLUCOSEU, HGBUR, BILIRUBINUR, KETONESUR, PROTEINUR, UROBILINOGEN, NITRITE, LEUKOCYTESUR,  in the last 72 hours    Imaging results:  No results found.  Other results: EKG: N/A  Assessment & Plan by Problem: Active Problems:   * No active hospital problems. *  Accelerated Hypertensive  Patient presents with chest tightness in the setting of BP 220/130 with edema in bil legs and abdomen. This likely represents hypertensive emergency. This is likely due to medical noncompliance. However, it could represent worsening renal dysfunction that may necessitate emergent dialysis if unresponsive. -  NTG gtt - Restart home meds (amlodipine 10 mg, coreg 25 mg) in AM  - Hold  lisinopril 40 mg due to cough and unknown GFR at moment - Lasix 80 mg IV TID - Troponin x 3 - EKG - CBC, CMP - ASA - CXR - Consult Renal if unresponsive to NTG drip - UDS  Cough Patient likely has an URI vs PNA vs COPD vs CHF vs multifactorial vs CKD as he is having cough and has wheezing and crackles. Patient is afebrile and symptoms have been progressive for one week. He reports compliance with HIV meds. CD4 600 on 9/15 and viral load was undetectable on 9/15. PCP PNA less likely. - CXR - Resp Viral Panel - Pro BNP - Duonebs with xopenex q 6 hr - May require emergent HD  CKD The patients volume overload is likely due to worsening CKD. He may be approaching the need for HD. (Dr. Clover Mealy) - Consult Renal if worsens or HTN is unresponsive to NTG gtt.  HIV Appears stable, will continue home meds as follows. Will call ID for med reconciliation in AM. raltagravir 400 mg BID darunavir 800 mg daily with breakfast  zidovudine 300 BID lamivudine 100 mg daily.  ritonovir 100 mg daily with breakfast  Gout Continue home meds Colchicine and fuboxostat  HLD Continue home statin.  Dispo: Disposition is deferred at this time, awaiting improvement of current medical problems. Anticipated discharge in approximately 2-3 day(s).   The patient does have a current PCP (Lesly Dukes, MD) and does need an [facility] hospital follow-up appointment after discharge.  The patient does not have transportation limitations that hinder transportation to clinic appointments.  Signed: Marrion Coy, MD 07/06/2013, 4:09 PM

## 2013-07-06 NOTE — Progress Notes (Signed)
Subjective:    Patient ID: Darrell Johnson, male    DOB: [birth year], 47 y.o.   MRN: [id number]  HPI [name] is a 47 y.o. man with PMH of gout (bilateral knees), HTN, HLD, CKD, history of syphilis in 1997, HIV on HAART (CD4 600, VL<20 in 03/2013), who presents to the clinic for routine follow up.   Patient reports cough and shortness of breath for about one month, which he attributes to a cold. He tried taking Robitussin, NyQuil, Mucinex without relief but has not seen a doctor until today. His "cold" gets worse when he is lying flat on his back, so he has been sleeping propped up in bed for the past month. He's gained about 5 kg since his last visit. Denies worsening swelling in his legs. Denies chest pain, headache, weakness, numbness, abdominal pain, nausea, vomiting.  In 11/14 he was admitted to [facility] for hypertensive emergency complicated by acute respiratory failure 2/2 acute pulmonary edema and AKI. He says he feels worse today than he did at that time.  In terms of medications he should be taking Coreg 25 mg bid, lisinopril 40 mg daily, lasix 40 mg daily, amlodipine 10 mg daily. Compliance has been in question in the past as his heart rate is rarely beta blocked when he comes in for clinic visits. Today he says he is taking all his medications except for Lasix.  For his HIV he follows with [name], who will see him again in January. Viral load was undetectable at his last visit in September. He got a flu shot in her office.    Current Outpatient Prescriptions on File Prior to Visit  Medication Sig Dispense Refill  . amLODipine (NORVASC) 10 MG tablet TAKE ONE (1) TABLET EACH DAY  30 tablet  7  . carvedilol (COREG) 25 MG tablet Take 1 tablet (25 mg total) by mouth 2 (two) times daily with a meal.  60 tablet  3  . cetirizine (ZYRTEC) 10 MG tablet Take 10 mg by mouth daily as needed for allergies or rhinitis.      Marland Kitchen colchicine 0.6 MG tablet Take 0.6 mg by mouth every  other day.       . Darunavir Ethanolate (PREZISTA) 800 MG tablet Take 1 tablet (800 mg total) by mouth daily with breakfast.  30 tablet  11  . febuxostat (ULORIC) 40 MG tablet Take 40 mg by mouth daily.      . furosemide (LASIX) 40 MG tablet Take 1 tablet (40 mg total) by mouth daily with breakfast.  30 tablet  3  . HYDROcodone-acetaminophen (NORCO) 10-325 MG per tablet Take 1 tablet by mouth every 8 (eight) hours as needed for pain.  90 tablet  0  . ISENTRESS 400 MG tablet TAKE 1 TABLET BY MOUTH TWICE A DAY  60 tablet  6  . lamivudine (EPIVIR) 100 MG tablet Take 1 tablet (100 mg total) by mouth daily.  30 tablet  11  . lamiVUDine-zidovudine (COMBIVIR) 150-300 MG per tablet TAKE 1 TABLET BY MOUTH 2 (TWO) TIMES DAILY.  60 tablet  3  . lisinopril (PRINIVIL,ZESTRIL) 40 MG tablet Take 1 tablet (40 mg total) by mouth daily.  30 tablet  11  . pravastatin (PRAVACHOL) 40 MG tablet Take 1 tablet (40 mg total) by mouth daily at 6 PM.  30 tablet  3  . ritonavir (NORVIR) 100 MG TABS Take 1 tablet (100 mg total) by mouth daily with breakfast.  30 tablet  11  . zidovudine (RETROVIR) 300 MG tablet Take 1 tablet (300 mg total) by mouth 2 (two) times daily.  60 tablet  11     Review of Systems  Constitutional: Negative for fever and chills.  HENT: Positive for postnasal drip.   Eyes: Negative for visual disturbance.  Respiratory: Positive for cough, shortness of breath and wheezing. Negative for chest tightness.   Cardiovascular: Negative for chest pain, palpitations and leg swelling.  Gastrointestinal: Negative for nausea, abdominal pain and diarrhea.  Genitourinary: Negative for dysuria.  Skin: Negative for rash.  Neurological: Negative for dizziness, weakness, numbness and headaches.       Objective:   Physical Exam  Constitutional: He is oriented to person, place, and time. He appears well-developed and well-nourished. No distress.  HENT:  Head: Normocephalic and atraumatic.  Eyes: Conjunctivae  and EOM are normal. Pupils are equal, round, and reactive to light.  Neck: Normal range of motion. Neck supple.  Cardiovascular: Regular rhythm.  Tachycardia present.   No murmur heard. Pulmonary/Chest: Accessory muscle usage present. He has wheezes (Worst in the anterior lung fields). He has rales (At the bases, right greater than left).  Abdominal: Soft. He exhibits no distension. There is no tenderness.  Musculoskeletal: He exhibits edema (1+ pitting edema at the ankles).  Neurological: He is alert and oriented to person, place, and time.  Skin: Skin is warm and dry.          Assessment & Plan:

## 2013-07-07 ENCOUNTER — Encounter (HOSPITAL_COMMUNITY): Payer: Self-pay | Admitting: Nephrology

## 2013-07-07 DIAGNOSIS — I129 Hypertensive chronic kidney disease with stage 1 through stage 4 chronic kidney disease, or unspecified chronic kidney disease: Secondary | ICD-10-CM

## 2013-07-07 DIAGNOSIS — N179 Acute kidney failure, unspecified: Principal | ICD-10-CM

## 2013-07-07 LAB — RESPIRATORY VIRUS PANEL
Adenovirus: NOT DETECTED
Influenza A H1: NOT DETECTED
Influenza A H3: NOT DETECTED
Influenza A: NOT DETECTED
Influenza B: NOT DETECTED
Metapneumovirus: NOT DETECTED
Parainfluenza 1: NOT DETECTED
Respiratory Syncytial Virus A: NOT DETECTED
Respiratory Syncytial Virus B: NOT DETECTED
Rhinovirus: NOT DETECTED

## 2013-07-07 LAB — BASIC METABOLIC PANEL
CO2: 28 mEq/L (ref 19–32)
Calcium: 8.3 mg/dL — ABNORMAL LOW (ref 8.4–10.5)
Calcium: 8.4 mg/dL (ref 8.4–10.5)
Chloride: 99 mEq/L (ref 96–112)
Creatinine, Ser: 5.22 mg/dL — ABNORMAL HIGH (ref 0.50–1.35)
Creatinine, Ser: 5.3 mg/dL — ABNORMAL HIGH (ref 0.50–1.35)
GFR calc Af Amer: 13 mL/min — ABNORMAL LOW (ref >90)
GFR calc non Af Amer: 12 mL/min — ABNORMAL LOW (ref >90)
Glucose, Bld: 79 mg/dL (ref 70–99)
Potassium: 3.8 mEq/L (ref 3.5–5.1)
Potassium: 3.3 mEq/L — ABNORMAL LOW (ref 3.5–5.1)
Sodium: 141 mEq/L (ref 135–145)
Sodium: 137 mEq/L (ref 135–145)

## 2013-07-07 LAB — RETICULOCYTES
RBC.: 2.73 MIL/uL — ABNORMAL LOW (ref 4.22–5.81)
Retic Count, Absolute: 46.4 10*3/uL (ref 19.0–186.0)
Retic Ct Pct: 1.7 % (ref 0.4–3.1)

## 2013-07-07 LAB — TROPONIN I
Troponin I: <0.3 ng/mL (ref <0.30)
Troponin I: <0.3 ng/mL (ref <0.30)

## 2013-07-07 MED ORDER — NICOTINE 21 MG/24HR TD PT24
21.0000 mg | MEDICATED_PATCH | Freq: Every day | TRANSDERMAL | Status: DC
  Administered 2013-07-08: 21 mg via TRANSDERMAL
  Filled 2013-07-07: qty 1

## 2013-07-07 MED ORDER — FUROSEMIDE 10 MG/ML IJ SOLN
80.0000 mg | Freq: Two times a day (BID) | INTRAMUSCULAR | Status: DC
  Administered 2013-07-07 – 2013-07-08 (×3): 80 mg via INTRAVENOUS
  Filled 2013-07-07 (×7): qty 8

## 2013-07-07 MED ORDER — AMLODIPINE BESYLATE 10 MG PO TABS
10.0000 mg | ORAL_TABLET | Freq: Every day | ORAL | Status: DC
  Administered 2013-07-07 – 2013-07-10 (×4): 10 mg via ORAL
  Filled 2013-07-07 (×4): qty 1

## 2013-07-07 MED ORDER — CARVEDILOL 25 MG PO TABS
25.0000 mg | ORAL_TABLET | Freq: Two times a day (BID) | ORAL | Status: DC
  Administered 2013-07-07: 25 mg via ORAL
  Filled 2013-07-07 (×4): qty 1

## 2013-07-07 MED ORDER — FUROSEMIDE 10 MG/ML IJ SOLN
80.0000 mg | Freq: Three times a day (TID) | INTRAMUSCULAR | Status: DC
  Administered 2013-07-07: 80 mg via INTRAVENOUS
  Filled 2013-07-07: qty 8

## 2013-07-07 MED ORDER — MAGNESIUM SULFATE 40 MG/ML IJ SOLN
2.0000 g | Freq: Once | INTRAMUSCULAR | Status: AC
  Administered 2013-07-07: 2 g via INTRAVENOUS
  Filled 2013-07-07: qty 50

## 2013-07-07 MED ORDER — LEVALBUTEROL HCL 1.25 MG/0.5ML IN NEBU
1.2500 mg | INHALATION_SOLUTION | Freq: Four times a day (QID) | RESPIRATORY_TRACT | Status: DC
  Administered 2013-07-07 – 2013-07-09 (×9): 1.25 mg via RESPIRATORY_TRACT
  Filled 2013-07-07 (×13): qty 0.5

## 2013-07-07 MED ORDER — IPRATROPIUM BROMIDE 0.02 % IN SOLN: 0.5000 mg | Freq: Four times a day (QID) | RESPIRATORY_TRACT | Status: DC | PRN

## 2013-07-07 MED ORDER — IPRATROPIUM BROMIDE 0.02 % IN SOLN
0.5000 mg | Freq: Four times a day (QID) | RESPIRATORY_TRACT | Status: DC
  Administered 2013-07-07 – 2013-07-09 (×9): 0.5 mg via RESPIRATORY_TRACT
  Filled 2013-07-07 (×8): qty 2.5

## 2013-07-07 MED ORDER — NICARDIPINE HCL IN NACL 20-0.86 MG/200ML-% IV SOLN
5.0000 mg/h | INTRAVENOUS | Status: DC
  Administered 2013-07-07: 2.5 mg/h via INTRAVENOUS
  Administered 2013-07-07 – 2013-07-08 (×5): 5 mg/h via INTRAVENOUS
  Filled 2013-07-07 (×9): qty 200

## 2013-07-07 MED ORDER — DM-GUAIFENESIN ER 30-600 MG PO TB12
1.0000 | ORAL_TABLET | Freq: Two times a day (BID) | ORAL | Status: DC
  Administered 2013-07-07 – 2013-07-09 (×4): 1 via ORAL
  Filled 2013-07-07 (×5): qty 1

## 2013-07-07 MED ORDER — POTASSIUM CHLORIDE CRYS ER 20 MEQ PO TBCR
40.0000 meq | EXTENDED_RELEASE_TABLET | Freq: Once | ORAL | Status: AC
  Administered 2013-07-07: 40 meq via ORAL
  Filled 2013-07-07: qty 2

## 2013-07-07 MED ORDER — LEVALBUTEROL HCL 1.25 MG/0.5ML IN NEBU
1.2500 mg | INHALATION_SOLUTION | Freq: Four times a day (QID) | RESPIRATORY_TRACT | Status: DC | PRN
  Filled 2013-07-07: qty 0.5

## 2013-07-07 MED ORDER — MAGNESIUM SULFATE 40 MG/ML IJ SOLN: 2.0000 g | Freq: Once | INTRAMUSCULAR | Status: DC

## 2013-07-07 NOTE — Progress Notes (Signed)
I saw and evaluated the patient.  I personally confirmed the key portions of the history and exam documented by Dr. Cater and I reviewed pertinent patient test results.  The assessment, diagnosis, and plan were formulated together and I agree with the documentation in the resident's note. 

## 2013-07-07 NOTE — Care Management Note (Signed)
    Page 1 of 1   07/07/2013     7:47:16 AM   CARE MANAGEMENT NOTE 07/07/2013  Patient:  DEMARCO, LODUCA   Account Number:  [id number]  Date Initiated:  07/07/2013  Documentation initiated by:  Elissa Hefty  Subjective/Objective Assessment:   adm w htn     Action/Plan:   lives w wife, pcp dr Judson Roch cater   Anticipated DC Date:     Anticipated DC Plan:  HOME/SELF CARE         Choice offered to / List presented to:             Status of service:   Medicare Important Message given?   (If response is "NO", the following Medicare IM given date fields will be blank) Date Medicare IM given:   Date Additional Medicare IM given:    Discharge Disposition:    Per UR Regulation:  Reviewed for med. necessity/level of care/duration of stay  If discussed at [place] of Stay Meetings, dates discussed:    Comments:

## 2013-07-07 NOTE — Consult Note (Signed)
Reason for Consult:AKI/CKD, hypertensive emergency with CHF Referring Physician: Daryll Drown, MD  Darrell Johnson is an 47 y.o. male.  HPI: Pt is a 47yo AAM with PMH sig for HIV (since 1998 on HAART), h/o syphillis, HTN, seizure d/o, medical noncompliance, as well as progressive CKD who presented to [facility] c/o worsening cough.  He was noted to have markedly elevated BP of 220/112 as well as evidence of pulm edema.  He was subsequently admitted for hypertensive emergency.  He had a similar presentation on 05/31/13 due to nonadherence with outpt BP meds.  He was seen in clinic 2 weeks ago and had not picked up all of his BP meds following d/c.  Initial labs revealed an increase in Scr to 5.28 and we were asked to help evaluate and manage his AKI/CKD and volume overload related to hypertensive urgency/emergency.    He was seen in consultation at our practice on 02/03/03 to evaluate proteinuria which was non-nephrotic but has had progressive CKD with Scr climbing over the last 2 years.  The trend in Scr is seen below.  Of note, he had been prescribed lisinopril but has been held since admission.   Pt denies any N/V/D/CP or use of NSAIDs/COX-II I's.  Trend in Creatinine:  Creatinine, Ser  Date/Time Value Range Status  07/07/2013  5:00 AM 5.22* 0.50 - 1.35 mg/dL Final  07/06/2013 11:20 PM 5.28* 0.50 - 1.35 mg/dL Final  07/06/2013  5:45 PM 5.28* 0.50 - 1.35 mg/dL Final  06/15/2013 11:30 AM 4.47* 0.50 - 1.35 mg/dL Final  06/12/2013  4:30 AM 4.43* 0.50 - 1.35 mg/dL Final  06/11/2013  5:00 AM 4.76* 0.50 - 1.35 mg/dL Final  06/11/2013  1:40 AM 4.85* 0.50 - 1.35 mg/dL Final  06/10/2013  7:33 PM 4.61* 0.50 - 1.35 mg/dL Final  04/04/2013  2:22 PM 3.13* 0.50 - 1.35 mg/dL Final  01/06/2013 11:29 AM 2.96* 0.50 - 1.35 mg/dL Final  12/23/2012  3:51 PM 2.76* 0.50 - 1.35 mg/dL Final  09/30/2012 12:09 PM 2.13* 0.50 - 1.35 mg/dL Final  09/17/2012  4:39 PM 2.08* 0.50 - 1.35 mg/dL Final  08/27/2012  3:30 PM 2.30* 0.50 -  1.35 mg/dL Final  05/13/2012  4:41 PM 2.00* 0.50 - 1.35 mg/dL Final  04/05/2012  4:22 PM 2.17* 0.50 - 1.35 mg/dL Final  02/03/2012  4:58 PM 2.30* 0.50 - 1.35 mg/dL Final  05/07/2011 10:40 AM 1.38* 0.50 - 1.35 mg/dL Final  10/23/2009  8:39 PM 1.68* 0.40-1.50 mg/dL Final  07/03/2009  6:31 PM 1.50  (0.40-1.50 mg/dL Final  03/14/2009  8:25 PM 1.66* (0.40-1.50 mg/dL Final  08/23/2008  8:55 PM 1.61* 0.40-1.50 mg/dL Final  06/08/2008  8:52 PM 1.84* 0.40-1.50 mg/dL Final  06/10/2007  8:36 PM 1.34  0.40-1.50 mg/dL Final  03/23/2007 10:34 PM 1.49  0.40-1.50 mg/dL Final  10/01/2006  7:38 PM 1.49  0.40-1.50 mg/dL Final  09/24/2006  8:37 PM 1.72* 0.40-1.50 mg/dL Final  05/26/2006 12:29 PM 1.40  0.40-1.50 mg/dL Final    PMH:   Past Medical History  Diagnosis Date  . 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 Hypertension   . Seizures     last seizure was >5 years ago, pt has family history of seizures  . Syphilis 1997    history of syphilis 1997  . Chronic kidney disease     stage 3-4 CKD, followed by Dr. Moshe Cipro  . 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 Rib fractures 01/2009  . HIV infection 1980's    on ART therapy since, followed by ID clinic, complicated  by neuropathy  . 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 No pertinent past surgical history.  Allergies: No Known Allergies  Medications:   Prior to Admission medications   Medication Sig Start Date End Date Taking? Authorizing Provider  amLODipine (NORVASC) 10 MG tablet Take 10 mg by mouth daily.   Yes Historical Provider, MD  colchicine 0.6 MG tablet Take 0.6 mg by mouth every other day.    Yes Historical Provider, MD  Darunavir Ethanolate (PREZISTA) 800 MG tablet Take 1 tablet (800 mg total) by mouth daily with breakfast. 01/07/13  Yes  Carlyle Basques, MD  febuxostat (ULORIC) 40 MG tablet Take 40 mg by mouth daily.   Yes Historical Provider, MD  furosemide (LASIX) 40 MG tablet Take 1 tablet (40 mg total) by mouth daily with breakfast. 06/12/13  Yes Clinton Gallant, MD  HYDROcodone-acetaminophen (NORCO) 10-325 MG per tablet Take 1 tablet by mouth every 8 (eight) hours as needed for pain. 04/27/13  Yes Lesly Dukes, MD  lamivudine (EPIVIR) 100 MG tablet Take 1 tablet (100 mg total) by mouth daily. 01/07/13  Yes Carlyle Basques, MD  lamiVUDine-zidovudine (COMBIVIR) 150-300 MG per tablet Take 1 tablet by mouth 2 (two) times daily.   Yes Historical Provider, MD  lisinopril (PRINIVIL,ZESTRIL) 40 MG tablet Take 1 tablet (40 mg total) by mouth daily. 04/27/13  Yes Lesly Dukes, MD  lopinavir-ritonavir Vevelyn Francois) 200-50 MG per tablet  06/07/13  Yes Historical Provider, MD  pravastatin (PRAVACHOL) 40 MG tablet Take 1 tablet (40 mg total) by mouth daily at 6 PM. 06/12/13  Yes Clinton Gallant, MD  raltegravir (ISENTRESS) 400 MG tablet Take 400 mg by mouth 2 (two) times daily.   Yes Historical Provider, MD  ritonavir (NORVIR) 100 MG TABS Take 1 tablet (100 mg total) by mouth daily with breakfast. 01/06/13  Yes Carlyle Basques, MD  zidovudine (RETROVIR) 300 MG tablet Take 1 tablet (300 mg total) by mouth 2 (two) times daily. 01/07/13  Yes Carlyle Basques, MD    Inpatient medications: . amLODipine  10 mg Oral Daily  . aspirin EC  81 mg Oral Daily  . carvedilol  25 mg Oral BID WC  . darunavir  800 mg Oral Q breakfast  . febuxostat  40 mg Oral Daily  . furosemide  80 mg Intravenous TID  . heparin  5,000 Units Subcutaneous Q8H  . ipratropium  0.5 mg Nebulization Q6H  . lamiVUDine  100 mg Oral Daily  . levalbuterol  1.25 mg Nebulization Q6H  . pravastatin  40 mg Oral q1800  . raltegravir  400 mg Oral BID  . ritonavir  100 mg Oral Q breakfast  . sodium chloride  3 mL Intravenous Q12H  . zidovudine  300 mg Oral BID    Discontinued Meds:   Medications  Discontinued During This Encounter  Medication Reason  . lisinopril (PRINIVIL,ZESTRIL) tablet 40 mg   . amLODipine (NORVASC) tablet 10 mg   . carvedilol (COREG) tablet 25 mg   . Darunavir Ethanolate (PREZISTA) tablet 800 mg   . lamiVUDine-zidovudine (COMBIVIR) 150-300 MG per tablet 1 tablet   . lamivudine (EPIVIR) tablet 100 mg Inpatient Standard  . carvedilol (COREG) 25 MG tablet Patient has not taken in last 30  days  . cetirizine (ZYRTEC) 10 MG tablet Patient has not taken in last 30 days  . lamiVUDine-zidovudine (COMBIVIR) 150-300 MG per tablet Inpatient Standard  . amLODipine (NORVASC) 10 MG tablet Inpatient Standard  . ISENTRESS 400 MG tablet Inpatient Standard  . KALETRA 200-50 MG per tablet   . KALETRA 200-50 MG per tablet   . potassium chloride SA (K-DUR,KLOR-CON) CR tablet 20 mEq   . nitroGLYCERIN 0.2 mg/mL in dextrose 5 % infusion   . simvastatin (ZOCOR) tablet 20 mg   . niCARdipine (CARDENE-IV) infusion (0.1 mg/ml)   . magnesium sulfate IVPB 2 g 50 mL   . levalbuterol (XOPENEX) nebulizer solution 0.63 mg   . ipratropium (ATROVENT) nebulizer solution 0.5 mg   . levalbuterol (XOPENEX) nebulizer solution 1.25 mg     Social History:  reports that he has been smoking Cigarettes.  He has a 6 pack-year smoking history. He has never used smokeless tobacco. He reports that he does not drink alcohol or use illicit drugs.  Family History:   Family History  Problem Relation Age of Onset  . Cancer Mother   . COPD Father   . Diabetes Sister   . Hypertension Sister   . Diabetes Brother   . Hypertension Brother   . Stroke Neg Hx     A comprehensive review of systems was negative except for: Constitutional: positive for fatigue and malaise Respiratory: positive for cough and productive of grey/brown sputum Cardiovascular: positive for lower extremity edema Weight change:   Intake/Output Summary (Last 24 hours) at 07/07/13 1229 Last data filed at 07/07/13 1100  Gross per 24  hour  Intake 1062.33 ml  Output   4250 ml  Net -3187.67 ml   BP 169/74  Pulse 104  Temp(Src) 98.1 F (36.7 C) (Oral)  Resp 22  Ht 5\' 10"  (1.778 m)  Wt 111.7 kg (246 lb 4.1 oz)  BMI 35.33 kg/m2  SpO2 100% Filed Vitals:   07/07/13 1059 07/07/13 1100 07/07/13 1130 07/07/13 1200  BP: 171/82 180/76 173/75 169/74  Pulse:  107 107 104  Temp:  98.1 F (36.7 C)    TempSrc:  Oral    Resp:      Height:      Weight:      SpO2:  100% 99% 100%     General appearance: alert, cooperative, no distress and mildly obese Head: Normocephalic, without obvious abnormality, atraumatic Neck: no adenopathy, no carotid bruit, no JVD, supple, symmetrical, trachea midline and thyroid not enlarged, symmetric, no tenderness/mass/nodules Resp: decreased BS at right base, no crackles Cardio: tachycardic without rub GI: soft, non-tender; bowel sounds normal; no masses,  no organomegaly Extremities: edema 1+ pretib   Labs: Basic Metabolic Panel:  Recent Labs Lab 07/06/13 1745 07/06/13 2320 07/07/13 0500  NA 142 138 141  K 3.4* 3.3* 3.8  CL 98 95* 99  CO2 30 27 28   GLUCOSE 80 78 79  BUN 65* 64* 63*  CREATININE 5.28* 5.28* 5.22*  ALBUMIN 2.8*  --   --   CALCIUM 7.9* 8.2* 8.3*   Liver Function Tests:  Recent Labs Lab 07/06/13 1745  AST 21  ALT 13  ALKPHOS 118*  BILITOT 0.1*  PROT 7.0  ALBUMIN 2.8*   No results found for this basename: LIPASE, AMYLASE,  in the last 168 hours No results found for this basename: AMMONIA,  in the last 168 hours CBC:  Recent Labs Lab 07/06/13 1745  WBC 10.2  HGB 9.2*  HCT 27.0*  MCV  97.5  PLT 263   PT/INR: @LABRCNTIP (inr:5) Cardiac Enzymes: ) Recent Labs Lab 07/06/13 1745 07/06/13 2320 07/07/13 0500  TROPONINI <0.30 <0.30 <0.30   CBG: No results found for this basename: GLUCAP,  in the last 168 hours  Iron Studies: No results found for this basename: IRON, TIBC, TRANSFERRIN, FERRITIN,  in the last 168 hours  Xrays/Other  Studies: Portable Chest 1 View  07/06/2013   CLINICAL DATA:  Difficulty breathing.  EXAM: PORTABLE CHEST - 1 VIEW  COMPARISON:  06/10/2013.  FINDINGS: Interim partial clearing of bilateral dense pulmonary alveolar infiltrates noted. There is mild to moderate residua. Persistent right-sided pleural effusion. Persistent cardiomegaly. These findings consistent with clearing congestive heart failure and pulmonary edema with residual pulmonary edema present. No pneumothorax. No acute bony abnormality.  IMPRESSION: Interim partial clearing of congestive heart failure and pulmonary edema. Persistent pulmonary edema remains. Small right pleural effusion remains.   Electronically Signed   By: Marcello Moores  Register   On: 07/06/2013 18:20     Assessment/Plan: 1. 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  Agree with holding ACE given AKI.  No indication for HD at this time. 1. Discussed the ways to delay the progression of CKD with  1. Tight BP control with goal <130/80 2. Use of an ACE/ARB 3. Avoidance of nephrotoxic agents such as NSAIDs/COX-II I's/IV contrast 4. Continued use of HAART 2. Will need to educate about RRT and its options 3. Vein mapping and placement of vascular access (which can be done as an outpt) 4. Stress the importance of compliance with meds and medical follow up 2. Hypertensive emergency- c/b AKI/CKD, pulm edema/CHF.  Improving with meds.  Would try to use long acting agents that are generic to help improve compliance and avoid meds such as clonidine which can worsen BP with nonadherence 3. Pulm edema/volume excess- diuresing well with IV Lasix but would decrease to BID given large diuresis over the last 24 hours to help prevent ischemic ATN and further worsening of AKI 4. HIV- cont with HAART 5. Anemia of chronic disease- might benefit from Aranesp one BP and pulm edema have improved.  Would also check iron studies 6. Protein malnutrition- dietician  eval   Tamra Koos A 07/07/2013, 12:29 PM

## 2013-07-07 NOTE — H&P (Signed)
  Date: 07/07/2013  Patient name: MURICE FINIGAN  Medical record number: [id number]  Date of birth: 27-Nov-1965   I have seen and evaluated Randalyn Rhea and discussed their care with the Residency Team.  Mr. Pokorski is a 47yo man who presents with SOB for about a month with associated orthopnea and weight gain.  He also has LE swelling, however, he does not think this is worse.  He has CKD stage 4, was supposed to be on lasix but was not taking it.  He further has a history of HIV, well controlled.   Assessment and Plan: I have seen and evaluated the patient as outlined above. I agree with the formulated Assessment and Plan as detailed in the residents' admission note, with the following changes:   1. Accelerated HTN/HTN emergency: Plan to start him on a drip to control his blood pressure with transition to PO medications once symptoms improve.  Hold lisinopril given renal dysfunction.  Agree with lasix dosing.    2. Acute on chronic renal failure with volume overload: Will likely need to consult renal for further involvement given his worsening kidney function.  He may be nearing dialysis.  If he does not diuresis well, we will get them involved.    3. Cough: Likely due to pulmonary edema, will check resp viral panel and give nebulizer treatments   Other issues are stable and addressed in resident note.     Sid Falcon, MD 12/18/20142:26 PM

## 2013-07-07 NOTE — Progress Notes (Signed)
Subjective:  Did well ON. SOB and cough improving. Diuresed well with 40 mg IV lasix followed by 80 mg IV lasix.  Objective: Vital signs in last 24 hours: Filed Vitals:   07/07/13 0830 07/07/13 0845 07/07/13 0900 07/07/13 0930  BP: 188/90 181/92 181/88 170/83  Pulse: 105 104 103 104  Temp:      TempSrc:      Resp:      Height:      Weight:      SpO2: 96% 96% 96% 98%   Weight change:   Intake/Output Summary (Last 24 hours) at 07/07/13 1024 Last data filed at 07/07/13 0900  Gross per 24 hour  Intake 722.33 ml  Output   4250 ml  Net -3527.67 ml    Physical Exam  Constitutional: He is oriented to person, place, and time. He appears well-developed and well-nourished. No distress.  HENT:  Head: Normocephalic and atraumatic.  Eyes: Conjunctivae and EOM are normal. Pupils are equal, round, and reactive to light.  Neck: Normal range of motion. Neck supple.  Cardiovascular: Regular rhythm. Tachycardia present.  No murmur heard.  Pulmonary/Chest: He has wheezes (Worst in the anterior lung fields). He has rales (At the bases bilatrerally that are much imrpoved from baseline).  Abdominal: Soft. He exhibits distension. There is no tenderness.  Musculoskeletal: He exhibits edema (2+ pitting edema on bil legs).  Neurological: He is alert and oriented to person, place, and time.  Skin: Skin is warm and dry.    Lab Results: Basic Metabolic Panel:  Recent Labs Lab 07/06/13 2320 07/07/13 0500  NA 138 141  K 3.3* 3.8  CL 95* 99  CO2 27 28  GLUCOSE 78 79  BUN 64* 63*  CREATININE 5.28* 5.22*  CALCIUM 8.2* 8.3*  MG 1.5  --    Liver Function Tests:  Recent Labs Lab 07/06/13 1745  AST 21  ALT 13  ALKPHOS 118*  BILITOT 0.1*  PROT 7.0  ALBUMIN 2.8*   CBC:  Recent Labs Lab 07/06/13 1745  WBC 10.2  HGB 9.2*  HCT 27.0*  MCV 97.5  PLT 263   Cardiac Enzymes:  Recent Labs Lab 07/06/13 1745 07/06/13 2320 07/07/13 0500  TROPONINI <0.30 <0.30 <0.30    BNP:  Recent Labs Lab 07/06/13 1745  PROBNP 5924.0*   Thyroid Function Tests:  Recent Labs Lab 07/06/13 1745  TSH 1.214   Urine Drug Screen: Drugs of Abuse     Component Value Date/Time   LABOPIA POSITIVE* 07/06/2013 1808   LABOPIA PPS 11/12/2012 1155   COCAINSCRNUR NONE DETECTED 07/06/2013 1808   COCAINSCRNUR NEG 11/12/2012 1155   LABBENZ NONE DETECTED 07/06/2013 1808   LABBENZ NEG 11/12/2012 1155   AMPHETMU NONE DETECTED 07/06/2013 1808   THCU NONE DETECTED 07/06/2013 1808   LABBARB NONE DETECTED 07/06/2013 1808   LABBARB NEG 11/12/2012 1155    Alcohol Level: No results found for this basename: ETH,  in the last 168 hours Urinalysis:  Recent Labs Lab 07/06/13 1808  COLORURINE YELLOW  LABSPEC 1.013  PHURINE 7.5  [place] >300*  UROBILINOGEN 0.2  NITRITE NEGATIVE  LEUKOCYTESUR NEGATIVE    Micro Results: Recent Results (from the past 240 hour(s))  MRSA PCR SCREENING     Status: None   Collection Time    07/06/13  3:23 PM      Result Value Range Status   MRSA by PCR NEGATIVE  NEGATIVE Final  Comment:            The GeneXpert MRSA Assay (FDA     approved for NASAL specimens     only), is one component of a     comprehensive MRSA colonization     surveillance program. It is not     intended to diagnose MRSA     infection nor to guide or     monitor treatment for     MRSA infections.   Studies/Results: Portable Chest 1 View  07/06/2013   CLINICAL DATA:  Difficulty breathing.  EXAM: PORTABLE CHEST - 1 VIEW  COMPARISON:  06/10/2013.  FINDINGS: Interim partial clearing of bilateral dense pulmonary alveolar infiltrates noted. There is mild to moderate residua. Persistent right-sided pleural effusion. Persistent cardiomegaly. These findings consistent with clearing congestive heart failure and pulmonary edema with residual pulmonary edema present. No pneumothorax. No acute bony  abnormality.  IMPRESSION: Interim partial clearing of congestive heart failure and pulmonary edema. Persistent pulmonary edema remains. Small right pleural effusion remains.   Electronically Signed   By: Marcello Moores  Register   On: 07/06/2013 18:20   Medications: I have reviewed the patient's current medications. Scheduled Meds: . amLODipine  10 mg Oral Daily  . aspirin EC  81 mg Oral Daily  . carvedilol  25 mg Oral BID WC  . darunavir  800 mg Oral Q breakfast  . febuxostat  40 mg Oral Daily  . furosemide  80 mg Intravenous TID  . heparin  5,000 Units Subcutaneous Q8H  . ipratropium  0.5 mg Nebulization Q6H  . lamiVUDine  100 mg Oral Daily  . levalbuterol  1.25 mg Nebulization Q6H  . pravastatin  40 mg Oral q1800  . raltegravir  400 mg Oral BID  . ritonavir  100 mg Oral Q breakfast  . sodium chloride  3 mL Intravenous Q12H  . zidovudine  300 mg Oral BID   Continuous Infusions: . niCARDipine 5 mg/hr (07/07/13 0900)   PRN Meds:.sodium chloride, acetaminophen, ondansetron (ZOFRAN) IV, ondansetron, sodium chloride Assessment/Plan: Active Problems:   Accelerated hypertension  Accelerated Hypertensive  Patient presents with chest tightness in the setting of BP 220/130 with edema in bil legs and abdomen. This likely represents hypertensive emergency. This is likely due to medical noncompliance. However, it could represent worsening renal dysfunction that may necessitate emergent dialysis if unresponsive.  - NTG gtt failed to control, switched to nicardipine gtt with good control of BP (25% reduction) - Restart home meds (amlodipine 10 mg, coreg 25 mg BID) in AM and wean gtt - Hold lisinopril 40 mg due to AKI - Lasix 80 mg IV TID  - Troponin x 3 negative - EKG = sinus tachycardia - CXR = Pulmonary edema - Consult Renal will appreciate rec's - UDS = opiates  Cough  Likely due to volume overload, but may be multifactorial as he is having cough and has wheezing and crackles. Patient is  afebrile and symptoms have been progressive for one week. He reports compliance with HIV meds. CD4 600 on 9/15 and viral load was undetectable on 9/15. PCP PNA unlikely. This has improved with good diuresis overnight Net neg 3.3 L. - CXR = Pulm Edema - Resp Viral Panel Pending - Pro BNP at baseline of 5000's - Duonebs with xopenex q 6 hr   CKD  The patients volume overload is likely due to worsening CKD. He may be approaching the need for HD. (Dr. Moshe Cipro)  - Consult Renal and appreciate rec's  HIV  Appears stable, will continue home meds as follows. Will call ID for med reconciliation. Currently on: raltagravir 400 mg BID  darunavir 800 mg daily with breakfast  zidovudine 300 BID  lamivudine 100 mg daily.  ritonovir 100 mg daily with breakfast   Gout  Continue home meds  Colchicine and fuboxostat   HLD  Continue home statin.   Dispo: Disposition is deferred at this time, awaiting improvement of current medical problems.  Anticipated discharge in approximately 2-3 day(s).   The patient does have a current PCP (Lesly Dukes, MD) and does need an [facility] hospital follow-up appointment after discharge.  The patient does not have transportation limitations that hinder transportation to clinic appointments.  .Services Needed at time of discharge: Y = Yes, Blank = No PT:   OT:   RN:   Equipment:   Other:     LOS: 1 day   Marrion Coy, MD 07/07/2013, 10:24 AM

## 2013-07-08 LAB — IRON AND TIBC: UIBC: 187 ug/dL (ref 125–400)

## 2013-07-08 LAB — BASIC METABOLIC PANEL
CO2: 25 mEq/L (ref 19–32)
Calcium: 8.6 mg/dL (ref 8.4–10.5)
Calcium: 8.5 mg/dL (ref 8.4–10.5)
Chloride: 104 mEq/L (ref 96–112)
Creatinine, Ser: 5.46 mg/dL — ABNORMAL HIGH (ref 0.50–1.35)
GFR calc Af Amer: 13 mL/min — ABNORMAL LOW (ref >90)
GFR calc non Af Amer: 11 mL/min — ABNORMAL LOW (ref >90)
Potassium: 3.8 mEq/L (ref 3.5–5.1)
Potassium: 3.5 mEq/L (ref 3.5–5.1)
Sodium: 142 mEq/L (ref 135–145)
Sodium: 137 mEq/L (ref 135–145)

## 2013-07-08 LAB — VITAMIN B12: Vitamin B-12: 614 pg/mL (ref 211–911)

## 2013-07-08 LAB — FOLATE: Folate: 8.4 ng/mL

## 2013-07-08 MED ORDER — CARVEDILOL 25 MG PO TABS
50.0000 mg | ORAL_TABLET | Freq: Two times a day (BID) | ORAL | Status: DC
  Administered 2013-07-08 – 2013-07-10 (×4): 50 mg via ORAL
  Filled 2013-07-08 (×6): qty 2

## 2013-07-08 MED ORDER — DOXAZOSIN MESYLATE 4 MG PO TABS
4.0000 mg | ORAL_TABLET | Freq: Every day | ORAL | Status: DC
  Administered 2013-07-08 – 2013-07-09 (×2): 4 mg via ORAL
  Filled 2013-07-08 (×4): qty 1

## 2013-07-08 MED ORDER — GUAIFENESIN ER 600 MG PO TB12
600.0000 mg | ORAL_TABLET | Freq: Two times a day (BID) | ORAL | Status: DC | PRN
  Administered 2013-07-08: 600 mg via ORAL
  Filled 2013-07-08: qty 1

## 2013-07-08 MED ORDER — ZIDOVUDINE 100 MG PO CAPS
300.0000 mg | ORAL_CAPSULE | Freq: Two times a day (BID) | ORAL | Status: DC
  Administered 2013-07-08 – 2013-07-10 (×5): 300 mg via ORAL
  Filled 2013-07-08 (×6): qty 3

## 2013-07-08 MED ORDER — DOXAZOSIN MESYLATE 4 MG PO TABS: 4.0000 mg | ORAL_TABLET | Freq: Every day | ORAL | Status: DC

## 2013-07-08 MED ORDER — NICOTINE 21 MG/24HR TD PT24
21.0000 mg | MEDICATED_PATCH | Freq: Every day | TRANSDERMAL | Status: DC
  Administered 2013-07-09 – 2013-07-10 (×2): 21 mg via TRANSDERMAL
  Filled 2013-07-08 (×2): qty 1

## 2013-07-08 MED ORDER — FLUTICASONE PROPIONATE 50 MCG/ACT NA SUSP
1.0000 | Freq: Every day | NASAL | Status: DC
  Administered 2013-07-08: 2 via NASAL
  Administered 2013-07-09 – 2013-07-10 (×2): 1 via NASAL
  Filled 2013-07-08: qty 16

## 2013-07-08 MED ORDER — LORATADINE 10 MG PO TABS
10.0000 mg | ORAL_TABLET | Freq: Every day | ORAL | Status: DC
  Administered 2013-07-08 – 2013-07-09 (×2): 10 mg via ORAL
  Filled 2013-07-08 (×2): qty 1

## 2013-07-08 NOTE — Progress Notes (Signed)
Subjective:  NAE ON. Continues to diurese well. Complaining of productive cough and sinus pressure. Ptn got mucinex last night.  Objective: Vital signs in last 24 hours: Filed Vitals:   07/08/13 0300 07/08/13 0400 07/08/13 0410 07/08/13 0500  BP: 166/85 172/82    Pulse: 98 110    Temp:   99.4 F (37.4 C)   TempSrc:   Oral   Resp:      Height:      Weight:    244 lb 4.3 oz (110.8 kg)  SpO2: 94% 96%     Weight change: -7 lb 15 oz (-3.6 kg)  Intake/Output Summary (Last 24 hours) at 07/08/13 0747 Last data filed at 07/08/13 0411  Gross per 24 hour  Intake 1993.75 ml  Output   3200 ml  Net -1206.25 ml    Physical Exam  Constitutional: He is oriented to person, place, and time. He appears well-developed and well-nourished. No distress.  HENT:  Head: Normocephalic and atraumatic.  Eyes: Conjunctivae and EOM are normal. Pupils are equal, round, and reactive to light.  Neck: Normal range of motion. Neck supple.  Cardiovascular: Regular rhythm. Tachycardia present.  No murmur heard.  Pulmonary/Chest: He has mild wheezes (Worst in the anterior lung fields). He has rales (At the bases bilatrerally that are much imrpoved from admission).  Abdominal: Soft. He exhibits mild distension. There is no tenderness.  Musculoskeletal: He exhibits edema (2+ pitting edema on bil legs).  Neurological: He is alert and oriented to person, place, and time.  Skin: Skin is warm and dry.    Lab Results: Basic Metabolic Panel:  Recent Labs Lab 07/06/13 2320  07/07/13 1900 07/08/13 0540  NA 138  < > 137 142  K 3.3*  < > 3.3* 3.8  CL 95*  < > 99 104  CO2 27  < > 26 24  GLUCOSE 78  < > 165* 86  BUN 64*  < > 58* 59*  CREATININE 5.28*  < > 5.30* 5.46*  CALCIUM 8.2*  < > 8.4 8.6  MG 1.5  --   --   --   < > = values in this interval not displayed. Liver Function Tests:  Recent Labs Lab 07/06/13 1745  AST 21  ALT 13  ALKPHOS 118*  BILITOT 0.1*  PROT 7.0  ALBUMIN 2.8*    CBC:  Recent Labs Lab 07/06/13 1745  WBC 10.2  HGB 9.2*  HCT 27.0*  MCV 97.5  PLT 263   Cardiac Enzymes:  Recent Labs Lab 07/06/13 1745 07/06/13 2320 07/07/13 0500  TROPONINI <0.30 <0.30 <0.30   BNP:  Recent Labs Lab 07/06/13 1745  PROBNP 5924.0*   Thyroid Function Tests:  Recent Labs Lab 07/06/13 1745  TSH 1.214   Urine Drug Screen: Drugs of Abuse     Component Value Date/Time   LABOPIA POSITIVE* 07/06/2013 1808   LABOPIA PPS 11/12/2012 1155   COCAINSCRNUR NONE DETECTED 07/06/2013 1808   COCAINSCRNUR NEG 11/12/2012 1155   LABBENZ NONE DETECTED 07/06/2013 1808   LABBENZ NEG 11/12/2012 1155   AMPHETMU NONE DETECTED 07/06/2013 1808   THCU NONE DETECTED 07/06/2013 1808   LABBARB NONE DETECTED 07/06/2013 1808   LABBARB NEG 11/12/2012 1155    Alcohol Level: No results found for this basename: ETH,  in the last 168 hours Urinalysis:  Recent Labs Lab 07/06/13 1808  COLORURINE YELLOW  LABSPEC 1.013  PHURINE 7.5  [place]uart >300*  UROBILINOGEN 0.2  NITRITE NEGATIVE  LEUKOCYTESUR NEGATIVE    Micro Results: Recent Results (from the past 240 hour(s))  MRSA PCR SCREENING     Status: None   Collection Time    07/06/13  3:23 PM      Result Value Range Status   MRSA by PCR NEGATIVE  NEGATIVE Final   Comment:            The GeneXpert MRSA Assay (FDA     approved for NASAL specimens     only), is one component of a     comprehensive MRSA colonization     surveillance program. It is not     intended to diagnose MRSA     infection nor to guide or     monitor treatment for     MRSA infections.  RESPIRATORY VIRUS PANEL     Status: None   Collection Time    07/06/13  6:38 PM      Result Value Range Status   Source - RVPAN NASAL SWAB   Corrected   Comment: CORRECTED ON 12/18 AT 1836: PREVIOUSLY REPORTED AS NASAL SWAB   Respiratory Syncytial Virus A NOT DETECTED   Final    Respiratory Syncytial Virus B NOT DETECTED   Final   Influenza A NOT DETECTED   Final   Influenza B NOT DETECTED   Final   Parainfluenza 1 NOT DETECTED   Final   Parainfluenza 2 NOT DETECTED   Final   Parainfluenza 3 NOT DETECTED   Final   Metapneumovirus NOT DETECTED   Final   Rhinovirus NOT DETECTED   Final   Adenovirus NOT DETECTED   Final   Influenza A H1 NOT DETECTED   Final   Influenza A H3 NOT DETECTED   Final   Comment: (NOTE)           Normal Reference Range for each Analyte: NOT DETECTED     Testing performed using the Luminex xTAG Respiratory Viral Panel test     kit.     This test was developed and its performance characteristics determined     by [facility]. It has not been cleared or approved by the [place]     Food and Drug Administration. This test is used for clinical purposes.     It should not be regarded as investigational or for research. This     laboratory is certified under the [place] (CLIA) as qualified to perform high complexity     clinical laboratory testing.     Performed at [facility]   Studies/Results: Portable Chest 1 View  07/06/2013   CLINICAL DATA:  Difficulty breathing.  EXAM: PORTABLE CHEST - 1 VIEW  COMPARISON:  06/10/2013.  FINDINGS: Interim partial clearing of bilateral dense pulmonary alveolar infiltrates noted. There is mild to moderate residua. Persistent right-sided pleural effusion. Persistent cardiomegaly. These findings consistent with clearing congestive heart failure and pulmonary edema with residual pulmonary edema present. No pneumothorax. No acute bony abnormality.  IMPRESSION: Interim partial clearing of congestive heart failure and pulmonary edema. Persistent pulmonary edema remains. Small right pleural effusion remains.   Electronically Signed   By: Marcello Moores  Register   On: 07/06/2013 18:20   Medications: I have reviewed the patient's current medications. Scheduled  Meds: . amLODipine  10 mg Oral Daily  . aspirin EC  81 mg Oral Daily  . carvedilol  25 mg Oral BID WC  .  darunavir  800 mg Oral Q breakfast  . dextromethorphan-guaiFENesin  1 tablet Oral BID  . febuxostat  40 mg Oral Daily  . furosemide  80 mg Intravenous BID  . heparin  5,000 Units Subcutaneous Q8H  . ipratropium  0.5 mg Nebulization Q6H  . lamiVUDine  100 mg Oral Daily  . levalbuterol  1.25 mg Nebulization Q6H  . nicotine  21 mg Transdermal Daily  . pravastatin  40 mg Oral q1800  . raltegravir  400 mg Oral BID  . ritonavir  100 mg Oral Q breakfast  . sodium chloride  3 mL Intravenous Q12H  . zidovudine  300 mg Oral BID   Continuous Infusions: . niCARDipine 5 mg/hr (07/08/13 0554)   PRN Meds:.sodium chloride, acetaminophen, ondansetron (ZOFRAN) IV, ondansetron, sodium chloride Assessment/Plan: Active Problems:   Accelerated hypertension  Accelerated Hypertensive  Patient presented with chest tightness in the setting of BP 220/130 with edema in bil legs and abdomen. This likely represents hypertensive emergency. This is likely due to medical noncompliance. However, it could represent worsening renal dysfunction that may necessitate emergent dialysis if unresponsive.  - NTG gtt failed to control, switched to nicardipine gtt with good control of BP (25% reduction) - Restart home meds (amlodipine 10 mg, coreg 25 mg BID) in AM and wean gtt. Unable to wean drip yesterday will increased coreg to 50 mg BID - Hold lisinopril 40 mg due to AKI - Lasix 80 mg IV BID per renal rec's, plan to switch to PO this PM - Troponin x 3 negative - EKG = sinus tachycardia - CXR = Pulmonary edema - Consult Renal will appreciate rec's - UDS = opiates  Cough  Likely due to volume overload, but may be multifactorial as he is having cough and has wheezing and crackles. Patient is afebrile and symptoms have been progressive for one week. He reports compliance with HIV meds. CD4 600 on 9/15 and viral load  was undetectable on 9/15. PCP PNA unlikely. This has improved with good diuresis overnight Net neg 3.3 L. - CXR = Pulm Edema - Resp Viral Panel Pending - Pro BNP at baseline of 5000's - Duonebs with xopenex q 6 hr  - Mucinex  CKD  The patients volume overload is likely due to worsening CKD. He may be approaching the need for HD. (Dr. Moshe Cipro)  - Consult Renal and appreciate rec's  Congestion - Will give claritin   HIV  Appears stable, will continue home meds as follows. Will call pharm for med reconciliation. Currently on: raltagravir 400 mg BID  darunavir 800 mg daily with breakfast  zidovudine 300 BID  lamivudine 100 mg daily.  ritonovir 100 mg daily with breakfast   Gout  Continue home meds  Colchicine and fuboxostat   HLD  Continue home statin.   Dispo: Disposition is deferred at this time, awaiting improvement of current medical problems.  Anticipated discharge in approximately 2-3 day(s).   The patient does have a current PCP (Lesly Dukes, MD) and does need an [facility] hospital follow-up appointment after discharge.  The patient does not have transportation limitations that hinder transportation to clinic appointments.  .Services Needed at time of discharge: Y = Yes, Blank = No PT:   OT:   RN:   Equipment:   Other:     LOS: 2 days   Marrion Coy, MD 07/08/2013, 7:46 AM

## 2013-07-08 NOTE — Progress Notes (Signed)
Patient ID: Darrell Johnson, male   DOB: 22-Oct-1965, 47 y.o.   MRN: [id number] S:no new complaints O:BP 162/74  Pulse 110  Temp(Src) 99.4 F (37.4 C) (Oral)  Resp 20  Ht 5\' 10"  (1.778 m)  Wt 110.8 kg (244 lb 4.3 oz)  BMI 35.05 kg/m2  SpO2 100%  Intake/Output Summary (Last 24 hours) at 07/08/13 1103 Last data filed at 07/08/13 0939  Gross per 24 hour  Intake 1416.75 ml  Output   2175 ml  Net -758.25 ml   Intake/Output: I/O last 3 completed shifts: In: 2481.6 [P.O.:1020; I.V.:1411.6; IV Piggyback:50] Out: [id number] [Urine:6425]  Intake/Output this shift:  Total I/O In: 3 [I.V.:3] Out: -  Weight change: -3.6 kg (-7 lb 15 oz) Gen:WD WN AAM in NAD CVS:no rub Resp:bibasilar crackles [id number] Ext:1+ edema bilaterally (improved)   Recent Labs Lab 07/06/13 1745 07/06/13 2320 07/07/13 0500 07/07/13 1900 07/08/13 0540  NA 142 138 141 137 142  K 3.4* 3.3* 3.8 3.3* 3.8  CL 98 95* 99 99 104  CO2 30 27 28 26 24   GLUCOSE 80 78 79 165* 86  BUN 65* 64* 63* 58* 59*  CREATININE 5.28* 5.28* 5.22* 5.30* 5.46*  ALBUMIN 2.8*  --   --   --   --   CALCIUM 7.9* 8.2* 8.3* 8.4 8.6  AST 21  --   --   --   --   ALT 13  --   --   --   --    Liver Function Tests:  Recent Labs Lab 07/06/13 1745  AST 21  ALT 13  ALKPHOS 118*  BILITOT 0.1*  PROT 7.0  ALBUMIN 2.8*   No results found for this basename: LIPASE, AMYLASE,  in the last 168 hours No results found for this basename: AMMONIA,  in the last 168 hours CBC:  Recent Labs Lab 07/06/13 1745  WBC 10.2  HGB 9.2*  HCT 27.0*  MCV 97.5  PLT 263   Cardiac Enzymes:  Recent Labs Lab 07/06/13 1745 07/06/13 2320 07/07/13 0500  TROPONINI <0.30 <0.30 <0.30   CBG: No results found for this basename: GLUCAP,  in the last 168 hours  Iron Studies:  Recent Labs  07/07/13 1900  IRON 47  TIBC 234  FERRITIN 275   Studies/Results: Portable Chest 1 View  07/06/2013   CLINICAL DATA:  Difficulty breathing.  EXAM: PORTABLE  CHEST - 1 VIEW  COMPARISON:  06/10/2013.  FINDINGS: Interim partial clearing of bilateral dense pulmonary alveolar infiltrates noted. There is mild to moderate residua. Persistent right-sided pleural effusion. Persistent cardiomegaly. These findings consistent with clearing congestive heart failure and pulmonary edema with residual pulmonary edema present. No pneumothorax. No acute bony abnormality.  IMPRESSION: Interim partial clearing of congestive heart failure and pulmonary edema. Persistent pulmonary edema remains. Small right pleural effusion remains.   Electronically Signed   By: Marcello Moores  Register   On: 07/06/2013 18:20   . amLODipine  10 mg Oral Daily  . aspirin EC  81 mg Oral Daily  . carvedilol  50 mg Oral BID WC  . darunavir  800 mg Oral Q breakfast  . dextromethorphan-guaiFENesin  1 tablet Oral BID  . febuxostat  40 mg Oral Daily  . furosemide  80 mg Intravenous BID  . heparin  5,000 Units Subcutaneous Q8H  . ipratropium  0.5 mg Nebulization Q6H  . lamiVUDine  100 mg Oral Daily  . levalbuterol  1.25 mg Nebulization Q6H  . loratadine  10 mg Oral Daily  .  nicotine  21 mg Transdermal Daily  . pravastatin  40 mg Oral q1800  . raltegravir  400 mg Oral BID  . ritonavir  100 mg Oral Q breakfast  . sodium chloride  3 mL Intravenous Q12H  . zidovudine  300 mg Oral Q12H    BMET    Component Value Date/Time   NA 142 07/08/2013 0540   K 3.8 07/08/2013 0540   CL 104 07/08/2013 0540   CO2 24 07/08/2013 0540   GLUCOSE 86 07/08/2013 0540   BUN 59* 07/08/2013 0540   CREATININE 5.46* 07/08/2013 0540   CREATININE 4.47* 06/15/2013 1130   CALCIUM 8.6 07/08/2013 0540   GFRNONAA 11* 07/08/2013 0540   GFRAA 13* 07/08/2013 0540   CBC    Component Value Date/Time   WBC 10.2 07/06/2013 1745   RBC 2.73* 07/07/2013 1900   RBC 2.77* 07/06/2013 1745   HGB 9.2* 07/06/2013 1745   HCT 27.0* 07/06/2013 1745   PLT 263 07/06/2013 1745   MCV 97.5 07/06/2013 1745   MCH 33.2 07/06/2013 1745   MCHC  34.1 07/06/2013 1745   RDW 15.5 07/06/2013 1745   LYMPHSABS 3.0 06/10/2013 1924   MONOABS 0.8 06/10/2013 1924   EOSABS 0.3 06/10/2013 1924   EOSABS 0.2 K/UL 05/26/2006 1229   BASOSABS 0.0 06/10/2013 1924     Assessment/Plan:  1. 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 Agree with holding ACE given AKI. No indication for HD at this time. Sl increase in Scr following sig diuresis 1. Discussed the ways to delay the progression of CKD with  1. Tight BP control with goal <130/80 2. Use of an ACE/ARB 3. Avoidance of nephrotoxic agents such as NSAIDs/COX-II I's/IV contrast 4. Continued use of HAART 2. Will need to educate about RRT and its options 3. Vein mapping and placement of vascular access.  Would ask [facility] to evaluate while he is an inpt (Scr has risen significantly over the last 6 months) 4. Stress the importance of compliance with meds and medical follow up 2. Hypertensive emergency- c/b AKI/CKD, pulm edema/CHF. Improving with meds but not at goal. Would consider alpha blocker such as doxazosyn 4mg  qhs and see if this will help get to goal.  Long-acting agents that are generic to help improve compliance and avoid meds such as clonidine which can worsen BP with nonadherence 3. Pulm edema/volume excess- diuresing well even with IV Lasix BID.  Continue with this dose for now and follow I's/O's and daily Scr.   4. HIV- cont with HAART 5. Anemia of chronic disease- might benefit from Aranesp one BP and pulm edema have improved.  1. Low iron and TSAT.  Consider: Feraheme 1020mg   IV x 1 dose and cont to follow. 6. Protein malnutrition- dietician eval 7. Vascular access:  Would recommend vein mapping and consultation with [facility] for access placement.  (his brother has h/o hypercoagulability and multiple clotted accesses).  [place] A

## 2013-07-08 NOTE — Progress Notes (Signed)
  Date: 07/08/2013  Patient name: Darrell Johnson  Medical record number: [id number]  Date of birth: 1966-06-03   This patient has been seen and the plan of care was discussed with the house staff. Please see their note for complete details. I concur with their findings with the following additions/corrections:  Nephrology is following along with [place].  Patient continues to diurese well with lasix, can consider transition to PO today or tomorrow.  He was off nicard drip when I saw him and his BP has improved with oral therapy, his coreg was increased.  Per Renal note, would recommend that we go ahead and contact vascular for vein mapping, which we will do.   Sid Falcon, MD 07/08/2013, 2:50 PM

## 2013-07-09 DIAGNOSIS — N184 Chronic kidney disease, stage 4 (severe): Secondary | ICD-10-CM

## 2013-07-09 DIAGNOSIS — I10 Essential (primary) hypertension: Secondary | ICD-10-CM

## 2013-07-09 DIAGNOSIS — Z01810 Encounter for preprocedural cardiovascular examination: Secondary | ICD-10-CM

## 2013-07-09 LAB — BASIC METABOLIC PANEL
BUN: 55 mg/dL — ABNORMAL HIGH (ref 6–23)
CO2: 24 mEq/L (ref 19–32)
CO2: 25 mEq/L (ref 19–32)
Chloride: 102 mEq/L (ref 96–112)
Chloride: 101 mEq/L (ref 96–112)
Creatinine, Ser: 5.36 mg/dL — ABNORMAL HIGH (ref 0.50–1.35)
GFR calc Af Amer: 13 mL/min — ABNORMAL LOW (ref >90)
GFR calc Af Amer: 13 mL/min — ABNORMAL LOW (ref >90)
GFR calc non Af Amer: 11 mL/min — ABNORMAL LOW (ref >90)
Glucose, Bld: 87 mg/dL (ref 70–99)
Glucose, Bld: 92 mg/dL (ref 70–99)
Potassium: 4 mEq/L (ref 3.5–5.1)
Sodium: 138 mEq/L (ref 135–145)

## 2013-07-09 MED ORDER — LORATADINE 10 MG PO TABS
10.0000 mg | ORAL_TABLET | ORAL | Status: DC
  Filled 2013-07-09: qty 1

## 2013-07-09 MED ORDER — GUAIFENESIN 100 MG/5ML PO SOLN
5.0000 mL | ORAL | Status: DC | PRN
  Administered 2013-07-09: 100 mg via ORAL
  Filled 2013-07-09 (×2): qty 5

## 2013-07-09 MED ORDER — FUROSEMIDE 80 MG PO TABS
80.0000 mg | ORAL_TABLET | Freq: Two times a day (BID) | ORAL | Status: DC
  Administered 2013-07-09 – 2013-07-10 (×3): 80 mg via ORAL
  Filled 2013-07-09 (×2): qty 1
  Filled 2013-07-09: qty 2
  Filled 2013-07-09 (×3): qty 1

## 2013-07-09 MED ORDER — LEVALBUTEROL HCL 1.25 MG/0.5ML IN NEBU
1.2500 mg | INHALATION_SOLUTION | Freq: Four times a day (QID) | RESPIRATORY_TRACT | Status: DC | PRN
  Filled 2013-07-09: qty 0.5

## 2013-07-09 MED ORDER — COLCHICINE 0.6 MG PO TABS: 0.3000 mg | ORAL_TABLET | Freq: Every day | ORAL | Status: DC

## 2013-07-09 MED ORDER — GUAIFENESIN-CODEINE 100-10 MG/5ML PO SOLN
5.0000 mL | Freq: Every evening | ORAL | Status: DC | PRN
  Administered 2013-07-10: 5 mL via ORAL
  Filled 2013-07-09: qty 5

## 2013-07-09 NOTE — Progress Notes (Signed)
  Date: 07/09/2013  Patient name: Darrell Johnson  Medical record number: [id number]  Date of birth: Jan 18, 1966   This patient has been seen and the plan of care was discussed with the house staff. Please see their note for complete details. I concur with their findings with the following additions/corrections:  Will transition to oral medications today, particularly lasix.  He is improved with oral antihypertensives and has had good UOP.  Will plan for transition out of the SDU.  Vascular has been consulted for vein mapping.  Possibly d/c tomorrow if continues to do well.   Sid Falcon, MD 07/09/2013, 10:43 AM

## 2013-07-09 NOTE — Progress Notes (Addendum)
Subjective: He has persistent cough that is scantly productive of clear sputum. Mucinex has not worked well for him. His nasal congestion is improving with Flonase which had been used at home (dispensed per hospital pharmacy). The swelling in his legs has significantly improved. Denies shortness of breath, chest pain, headache, or abdominal pain.   Objective: Vital signs in last 24 hours: Filed Vitals:   07/09/13 0000 07/09/13 0121 07/09/13 0500 07/09/13 0800  BP: 159/85   182/77  Pulse: 94 99  100  Temp:    98.4 F (36.9 C)  TempSrc:    Oral  Resp:  16    Height:      Weight:   242 lb 1 oz (109.8 kg)   SpO2: 98% 99%  96%   Weight change: -2 lb 3.3 oz (-1 kg)  Intake/Output Summary (Last 24 hours) at 07/09/13 0937 Last data filed at 07/09/13 0800  Gross per 24 hour  Intake 1820.25 ml  Output   3275 ml  Net -1454.75 ml   Vitals reviewed. General: Sitting up in chair, in NAD HEENT: no scleral icterus Cardiac: RRR, no rubs, murmurs or gallops Pulm: clear to auscultation bilaterally, no wheezes, rales, or rhonchi Abd: soft, nontender, non distended (improved since admission), BS present Ext: warm and well perfused, 1+ pedal edema bilaterally up to his knees Neuro: alert and oriented X3, moves all extremities voluntairly   Lab Results: Basic Metabolic Panel:  Recent Labs Lab 07/06/13 2320  07/08/13 1845 07/09/13 0515  NA 138  < > 137 139  K 3.3*  < > 3.5 4.0  CL 95*  < > 100 102  CO2 27  < > 25 24  GLUCOSE 78  < > 150* 87  BUN 64*  < > 56* 55*  CREATININE 5.28*  < > 5.43* 5.55*  CALCIUM 8.2*  < > 8.5 8.9  MG 1.5  --   --   --   < > = values in this interval not displayed. Liver Function Tests:  Recent Labs Lab 07/06/13 1745  AST 21  ALT 13  ALKPHOS 118*  BILITOT 0.1*  PROT 7.0  ALBUMIN 2.8*   CBC:  Recent Labs Lab 07/06/13 1745  WBC 10.2  HGB 9.2*  HCT 27.0*  MCV 97.5  PLT 263   Cardiac Enzymes:  Recent Labs Lab 07/06/13 1745  07/06/13 2320 07/07/13 0500  TROPONINI <0.30 <0.30 <0.30   BNP:  Recent Labs Lab 07/06/13 1745  PROBNP 5924.0*   Thyroid Function Tests:  Recent Labs Lab 07/06/13 1745  TSH 1.214   Anemia Panel:  Recent Labs Lab 07/07/13 1900  VITAMINB12 614  FOLATE 8.4  FERRITIN 275  TIBC 234  IRON 47  RETICCTPCT 1.7   Urine Drug Screen: Drugs of Abuse     Component Value Date/Time   LABOPIA POSITIVE* 07/06/2013 1808   LABOPIA PPS 11/12/2012 1155   COCAINSCRNUR NONE DETECTED 07/06/2013 1808   COCAINSCRNUR NEG 11/12/2012 1155   LABBENZ NONE DETECTED 07/06/2013 1808   LABBENZ NEG 11/12/2012 1155   AMPHETMU NONE DETECTED 07/06/2013 1808   THCU NONE DETECTED 07/06/2013 1808   LABBARB NONE DETECTED 07/06/2013 1808   LABBARB NEG 11/12/2012 1155    Urinalysis:  Recent Labs Lab 07/06/13 1808  COLORURINE YELLOW  LABSPEC 1.013  PHURINE 7.5  GLUCOSEU NEGATIVE  HGBUR SMALL*  BILIRUBINUR NEGATIVE  KETONESUR NEGATIVE  PROTEINUR >300*  UROBILINOGEN 0.2  NITRITE NEGATIVE  LEUKOCYTESUR NEGATIVE    Micro Results: Recent Results (from  the past 240 hour(s))  MRSA PCR SCREENING     Status: None   Collection Time    07/06/13  3:23 PM      Result Value Range Status   MRSA by PCR NEGATIVE  NEGATIVE Final   Comment:            The GeneXpert MRSA Assay (FDA     approved for NASAL specimens     only), is one component of a     comprehensive MRSA colonization     surveillance program. It is not     intended to diagnose MRSA     infection nor to guide or     monitor treatment for     MRSA infections.  RESPIRATORY VIRUS PANEL     Status: None   Collection Time    07/06/13  6:38 PM      Result Value Range Status   Source - RVPAN NASAL SWAB   Corrected   Comment: CORRECTED ON 12/18 AT 1836: PREVIOUSLY REPORTED AS NASAL SWAB   Respiratory Syncytial Virus A NOT DETECTED   Final   Respiratory Syncytial Virus B NOT DETECTED   Final   Influenza A NOT DETECTED   Final   Influenza B  NOT DETECTED   Final   Parainfluenza 1 NOT DETECTED   Final   Parainfluenza 2 NOT DETECTED   Final   Parainfluenza 3 NOT DETECTED   Final   Metapneumovirus NOT DETECTED   Final   Rhinovirus NOT DETECTED   Final   Adenovirus NOT DETECTED   Final   Influenza A H1 NOT DETECTED   Final   Influenza A H3 NOT DETECTED   Final   Comment: (NOTE)           Normal Reference Range for each Analyte: NOT DETECTED     Testing performed using the Luminex xTAG Respiratory Viral Panel test     kit.     This test was developed and its performance characteristics determined     by [facility]. It has not been cleared or approved by the [place]     Food and Drug Administration. This test is used for clinical purposes.     It should not be regarded as investigational or for research. This     laboratory is certified under the [place] (CLIA) as qualified to perform high complexity     clinical laboratory testing.     Performed at [facility]    Medications: I have reviewed the patient's current medications. Scheduled Meds: . amLODipine  10 mg Oral Daily  . aspirin EC  81 mg Oral Daily  . carvedilol  50 mg Oral BID WC  . darunavir  800 mg Oral Q breakfast  . doxazosin  4 mg Oral QHS  . febuxostat  40 mg Oral Daily  . fluticasone  1-2 spray Each Nare Daily  . furosemide  80 mg Oral BID  . heparin  5,000 Units Subcutaneous Q8H  . ipratropium  0.5 mg Nebulization Q6H  . lamiVUDine  100 mg Oral Daily  . levalbuterol  1.25 mg Nebulization Q6H  . loratadine  10 mg Oral Daily  . nicotine  21 mg Transdermal Daily  . pravastatin  40 mg Oral q1800  . raltegravir  400 mg Oral BID  . ritonavir  100 mg Oral Q breakfast  . sodium chloride  3 mL Intravenous Q12H  . zidovudine  300 mg Oral Q12H   Continuous Infusions:  PRN Meds:.sodium chloride, acetaminophen, guaiFENesin, guaiFENesin-codeine, ondansetron (ZOFRAN) IV, ondansetron, sodium  chloride Assessment/Plan: 47 year old man with PMH of CKD stage 4, HTN, presenting with accelerated hypertension with BP of 220/130.   Accelerated Hypertension - BP 220/130 with LE and abdominal distention and pulmonary dema with chest tightness, essentially hypertensive emergency. This was in the setting of medication noncompliance with 2-3 days of not diuretic or antihypertensive use in the context of CKD4 with subsequent volume overload. He has been diuresing with Lasix 80mg  BID IV, ~5.8L net negative since admission with weight down to 242lbs from 252lbs on admission. His dry weight is unknown but is significantly less volume overloaded today, per physical exam. His BP was initially controlled with nicardipine drip and oral antihypertensives but this drip was discontinued yesterday afternoon. He started doxazosin 4mg  qHS last night with good response. This morning his BP is elevated but he has not received his oral anti-hypertensive as of yet. Nonetheless, renal artery stenosis could explain his resistant hypertension.  - Continue home amlodipine 10 mg.  - Continue Coreg 50mg  BID.  - Continue Doxazosin 4mg  qHS - Continue Lasix 80mg  BID - Hold lisinopril 40 mg due to AKI  - Renal artery duplex  - Nephrology following, appreciate recommendations.    Cough/Nasal congestion -Multifactorial with URI symptoms and volume overload. He remains afebrile. Respiratory virus panel negative. His cough is scantly productive of white sputum with PNA unlikely though he could have atypical PNA. He reports compliance with HIV meds. CD4 600 on 9/15 and viral load was undetectable on 9/15. PCP PNA unlikely. This has improved with good diuresis and Flonase nasal spray.   - Continue Flonase nasal spray - Will start Robitussin today with Robitussin AC qHS PRN cough this evening (may try tessalon perls, or Tussionex if no improvement) - xopenex neb q 6 hr PRN for wheezing/shortness of breath - Continue Claritin  (renally dosed)  Chronic renal failure, CKD stage 4-  He presented volume overloaded which was likely due to CKD--CHF exacerbation unlikely given 2D echo on 06/12/13 with EF 0000000 and no diastolic dysfunction. His Creatinine has trended up gradually in the past months and he may be approaching the need for HD. Nephrology has been following him with ongoing counseling and education in regards to possible need for dialysis in the near future. Vascular Surgery has been consulted for vein mapping and outpatient follow up.  -Appreciate Nephrology's recommendations -Appreciated Vascular Surgery's recommendation  -f/u renal artery duplex   HIV-Appears stable, will continue home meds as follows. Currently on:  raltagravir 400 mg BID  darunavir 800 mg daily with breakfast  zidovudine 300 BID  lamivudine 100 mg daily.  ritonovir 100 mg daily with breakfast   Gout - No recent flare up. Discontinue colchicine home dose in the setting of worsening CKD and concurrent use of ART. Will continue Uloric but monitor closely as there are no FDA/US dose adjustments for decreased renal function for this medication at this time.    HLD -Continue statin but may need dose adjustment based on his renal function.    Diet: NPO until renal artery duplex then resume renal diet.   DVT prophylaxis: heparin TID.   Dispo: Transferred from [place] to telemetry. Disposition is deferred at this time, awaiting improvement of current medical problems.  Anticipated discharge in approximately tomorrow.   The patient does have a current PCP (Lesly Dukes, MD) and does need an [facility] hospital follow-up appointment  after discharge.  The patient does not have transportation limitations that hinder transportation to clinic appointments.  .Services Needed at time of discharge: Y = Yes, Blank = No PT:   OT:   RN:   Equipment:   Other:     LOS: 3 days   Blain Pais, MD 07/09/2013, 9:37 AM

## 2013-07-09 NOTE — Progress Notes (Addendum)
Bilateral upper extremity vein mapping and bilateral renal artery duplex completed.    Right  Upper Extremity Vein Map    Cephalic  Segment Diameter Depth Comment  1. Axilla 3.57mm mm   2. Mid upper arm 3.22mm mm   3. Above AC 3.49mm mm   4. In AC 3.39mm mm   5. Below AC 3.31mm mm   6. Mid forearm 3.7mm mm   7. Wrist 3.60mm mm Branch   mm mm    mm mm    mm mm       Left Upper Extremity Vein Map    Cephalic  Segment Diameter Depth Comment  1. Axilla 1.20mm mm   2. Mid upper arm 2.41mm mm   3. Above AC 2.58mm mm   4. In AC 2.91mm mm   5. Below AC 2.52mm mm   6. Mid forearm 3.39mm mm   7. Wrist 3.62mm mm    mm mm    mm mm    mm mm    Basilic  Segment Diameter Depth Comment  1. Axilla 5.81mm 24mm   2. Mid upper arm 6.75mm 19.31mm   3. Above AC mm mm   4. In [facility] 5.71mm 17.39mm   5. Below AC 3.78mm 2.43mm Branch  6. Mid forearm 1.4mm 2.76mm   7. Wrist 1.58mm 2.26mm    mm mm    mm mm    mm mm     *PRELIMINARY RESULTS* Vascular Ultrasound Renal Artery Duplex has been completed.   There is no obvious evidence of hemodynamically significant renal artery stenosis bilaterally.   07/09/2013 4:59 PM Maudry Mayhew, RVT, RDCS, RDMS

## 2013-07-09 NOTE — Progress Notes (Signed)
Patient ID: Darrell Johnson, male   DOB: 25-Nov-1965, 47 y.o.   MRN: [id number] S:feels better O:BP 159/85  Pulse 99  Temp(Src) 98.4 F (36.9 C) (Oral)  Resp 16  Ht 5\' 10"  (1.778 m)  Wt 109.8 kg (242 lb 1 oz)  BMI 34.73 kg/m2  SpO2 99%  Intake/Output Summary (Last 24 hours) at 07/09/13 0855 Last data filed at 07/09/13 0500  Gross per 24 hour  Intake 1635.25 ml  Output   2475 ml  Net -839.75 ml   Intake/Output: I/O last 3 completed shifts: In: 2499 [P.O.:1375; I.V.:1124] Out: 4200 [Urine:4200]  Intake/Output this shift:    Weight change: -1 kg (-2 lb 3.3 oz) Gen:WD WN AAM in NAD CVS:no rub Resp:crackles at bases R>L [id number] Ext:+1 pretib edema   Recent Labs Lab 07/06/13 1745 07/06/13 2320 07/07/13 0500 07/07/13 1900 07/08/13 0540 07/08/13 1845 07/09/13 0515  NA 142 138 141 137 142 137 139  K 3.4* 3.3* 3.8 3.3* 3.8 3.5 4.0  CL 98 95* 99 99 104 100 102  CO2 30 27 28 26 24 25 24   GLUCOSE 80 78 79 165* 86 150* 87  BUN 65* 64* 63* 58* 59* 56* 55*  CREATININE 5.28* 5.28* 5.22* 5.30* 5.46* 5.43* 5.55*  ALBUMIN 2.8*  --   --   --   --   --   --   CALCIUM 7.9* 8.2* 8.3* 8.4 8.6 8.5 8.9  AST 21  --   --   --   --   --   --   ALT 13  --   --   --   --   --   --    Liver Function Tests:  Recent Labs Lab 07/06/13 1745  AST 21  ALT 13  ALKPHOS 118*  BILITOT 0.1*  PROT 7.0  ALBUMIN 2.8*   No results found for this basename: LIPASE, AMYLASE,  in the last 168 hours No results found for this basename: AMMONIA,  in the last 168 hours CBC:  Recent Labs Lab 07/06/13 1745  WBC 10.2  HGB 9.2*  HCT 27.0*  MCV 97.5  PLT 263   Cardiac Enzymes:  Recent Labs Lab 07/06/13 1745 07/06/13 2320 07/07/13 0500  TROPONINI <0.30 <0.30 <0.30   CBG: No results found for this basename: GLUCAP,  in the last 168 hours  Iron Studies:  Recent Labs  07/07/13 1900  IRON 47  TIBC 234  FERRITIN 275   Studies/Results: No results found. Marland Kitchen amLODipine  10 mg Oral  Daily  . aspirin EC  81 mg Oral Daily  . carvedilol  50 mg Oral BID WC  . darunavir  800 mg Oral Q breakfast  . dextromethorphan-guaiFENesin  1 tablet Oral BID  . doxazosin  4 mg Oral QHS  . febuxostat  40 mg Oral Daily  . fluticasone  1-2 spray Each Nare Daily  . furosemide  80 mg Oral BID  . heparin  5,000 Units Subcutaneous Q8H  . ipratropium  0.5 mg Nebulization Q6H  . lamiVUDine  100 mg Oral Daily  . levalbuterol  1.25 mg Nebulization Q6H  . loratadine  10 mg Oral Daily  . nicotine  21 mg Transdermal Daily  . pravastatin  40 mg Oral q1800  . raltegravir  400 mg Oral BID  . ritonavir  100 mg Oral Q breakfast  . sodium chloride  3 mL Intravenous Q12H  . zidovudine  300 mg Oral Q12H    BMET    Component  Value Date/Time   NA 139 07/09/2013 0515   K 4.0 07/09/2013 0515   CL 102 07/09/2013 0515   CO2 24 07/09/2013 0515   GLUCOSE 87 07/09/2013 0515   BUN 55* 07/09/2013 0515   CREATININE 5.55* 07/09/2013 0515   CREATININE 4.47* 06/15/2013 1130   CALCIUM 8.9 07/09/2013 0515   GFRNONAA 11* 07/09/2013 0515   GFRAA 13* 07/09/2013 0515   CBC    Component Value Date/Time   WBC 10.2 07/06/2013 1745   RBC 2.73* 07/07/2013 1900   RBC 2.77* 07/06/2013 1745   HGB 9.2* 07/06/2013 1745   HCT 27.0* 07/06/2013 1745   PLT 263 07/06/2013 1745   MCV 97.5 07/06/2013 1745   MCH 33.2 07/06/2013 1745   MCHC 34.1 07/06/2013 1745   RDW 15.5 07/06/2013 1745   LYMPHSABS 3.0 06/10/2013 1924   MONOABS 0.8 06/10/2013 1924   EOSABS 0.3 06/10/2013 1924   EOSABS 0.2 K/UL 05/26/2006 1229   BASOSABS 0.0 06/10/2013 1924     Assessment/Plan:  1. 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 Agree with holding ACE given AKI. No indication for HD at this time. Sl increase in Scr following sig diuresis  1. Discussed the ways to delay the progression of CKD with  1. Tight BP control with goal <130/80 2. Use of an ACE/ARB 3. Avoidance of nephrotoxic agents such as  NSAIDs/COX-II I's/IV contrast 4. Continued use of HAART 2. Will need to educate about RRT and its options 3. Vein mapping and placement of vascular access. Would ask [facility] to evaluate while he is an inpt (Scr has risen significantly over the last 6 months) 4. Stress the importance of compliance with meds and medical follow up 5. To follow up with Dr. Moshe Cipro within the next 2 weeks and encouraged him to attend the kidney options class to learn more about his options for RRT. 2. Hypertensive emergency- c/b AKI/CKD, pulm edema/CHF. Improving with meds but not at goal. Would still consider alpha blocker such as doxazosyn 4mg  qhs and see if this will help get to goal. Long-acting agents that are generic to help improve compliance and avoid meds such as clonidine which can worsen BP with nonadherence 3. Pulm edema/volume excess- diuresing well and agree with switching to po lasix. Follow I's/O's and daily Scr.  4. HIV- cont with HAART 5. Anemia of chronic disease- might benefit from Aranesp one BP and pulm edema have improved.  1. Low iron and TSAT. Consider: Feraheme 1020mg  IV x 1 dose and cont to follow. 6. Protein malnutrition- dietician eval 7. Vascular access: Would recommend vein mapping and consultation with [facility] for access placement. (his brother has h/o hypercoagulability and multiple clotted accesses). 8. Dispo- per primary svc  Tal Neer A

## 2013-07-09 NOTE — Consult Note (Signed)
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 Donato Heinz A   History of Present Illness: [name] is a 47yo man who presents with SOB for about a month with associated orthopnea and weight gain. He also has LE swelling, however, he does not think this is worse. He has CKD stage 4, was supposed to be on lasix but was not taking it. He further has a history of HIV, well controlled.   He is being treated for Hypertensive emergency.  We have been ask to see him for future permanent dialysis access.  Current medical treatment includes Asprin 81 mg daily, pravastatin, and Heparin SQ.  For his BP he is on Coreg and lasix.         Current Facility-Administered Medications  Medication Dose Route Frequency Provider Last Rate Last Dose  . 0.9 %  sodium chloride infusion  250 mL Intravenous PRN Blain Pais, MD   250 mL at 07/07/13 1900  . acetaminophen (TYLENOL) tablet 650 mg  650 mg Oral Q6H PRN Ivor Costa, MD   650 mg at 07/09/13 0308  . amLODipine (NORVASC) tablet 10 mg  10 mg Oral Daily Marrion Coy, MD   10 mg at 07/08/13 0930  . aspirin EC tablet 81 mg  81 mg Oral Daily Blain Pais, MD   81 mg at 07/08/13 0931  . carvedilol (COREG) tablet 50 mg  50 mg Oral BID WC Marrion Coy, MD   50 mg at 07/09/13 0826  . Darunavir Ethanolate (PREZISTA) tablet 800 mg  800 mg Oral Q breakfast Blain Pais, MD   800 mg at 07/09/13 0825  . dextromethorphan-guaiFENesin (MUCINEX DM) 30-600 MG per 12 hr tablet 1 tablet  1 tablet Oral BID Rebecca Eaton, MD   1 tablet at 07/08/13 2209  . doxazosin (CARDURA) tablet 4 mg  4 mg Oral QHS Rebecca Eaton, MD   4 mg at 07/08/13 2311  . febuxostat (ULORIC) tablet 40 mg  40 mg Oral Daily Blain Pais, MD   40 mg at 07/08/13 0930  . fluticasone (FLONASE) 50 MCG/ACT nasal spray 1-2 spray  1-2 spray  Each Nare Daily Rebecca Eaton, MD   2 spray at 07/08/13 2209  . furosemide (LASIX) tablet 80 mg  80 mg Oral BID Blain Pais, MD   80 mg at 07/09/13 0830  . guaiFENesin (MUCINEX) 12 hr tablet 600 mg  600 mg Oral BID PRN Marrion Coy, MD   600 mg at 07/08/13 1608  . heparin injection 5,000 Units  5,000 Units Subcutaneous Q8H Blain Pais, MD   5,000 Units at 07/09/13 [id number]  . ipratropium (ATROVENT) nebulizer solution 0.5 mg  0.5 mg Nebulization Q6H Marrion Coy, MD   0.5 mg at 07/09/13 0121  . lamiVUDine (EPIVIR) 10 MG/ML solution 100 mg  100 mg Oral Daily Sid Falcon, MD   100 mg at 07/08/13 0931  . levalbuterol (XOPENEX) nebulizer solution 1.25 mg  1.25 mg Nebulization Q6H Marrion Coy, MD   1.25 mg at 07/09/13 0121  . loratadine (CLARITIN) tablet 10 mg  10 mg Oral Daily Marrion Coy, MD   10 mg at 07/08/13 1502  . nicotine (NICODERM CQ - dosed in mg/24 hours) patch 21 mg  21 mg Transdermal Daily Blain Pais, MD      . ondansetron [facility]) tablet 4 mg  4 mg Oral  Q6H PRN Blain Pais, MD       Or  . ondansetron [facility]) injection 4 mg  4 mg Intravenous Q6H PRN Blain Pais, MD      . pravastatin (PRAVACHOL) tablet 40 mg  40 mg Oral q1800 Sid Falcon, MD   40 mg at 07/08/13 1751  . raltegravir (ISENTRESS) tablet 400 mg  400 mg Oral BID Blain Pais, MD   400 mg at 07/08/13 2209  . ritonavir (NORVIR) tablet 100 mg  100 mg Oral Q breakfast Blain Pais, MD   100 mg at 07/09/13 0825  . sodium chloride 0.9 % injection 3 mL  3 mL Intravenous Q12H Blain Pais, MD   3 mL at 07/08/13 2210  . sodium chloride 0.9 % injection 3 mL  3 mL Intravenous PRN Blain Pais, MD      . zidovudine (RETROVIR) capsule 300 mg  300 mg Oral Q12H Sid Falcon, MD   300 mg at 07/08/13 2209    Pt meds include: Statin :Yes Betablocker: No ASA: Yes Other anticoagulants/antiplatelets: Heparin  Past Medical History   Diagnosis Date  . 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 Hypertension   . Seizures     last seizure was >5 years ago, pt has family history of seizures  . Syphilis 1997    history of syphilis 1997  . Chronic kidney disease     stage 3-4 CKD, followed by Dr. Moshe Cipro  . 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 Rib fractures 01/2009  . HIV infection 1980's    on ART therapy since, followed by ID clinic, complicated  by neuropathy  . 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 No pertinent past surgical history.  Social History History  Substance Use Topics  . Smoking status: Current Every Day Smoker -- 0.30 packs/day for 20 years    Types: Cigarettes  . Smokeless tobacco: Never Used     Comment: Trying to cut back.  . Alcohol Use: No    Family History Family History  Problem Relation Age of Onset  . Cancer Mother   . COPD Father   . Diabetes Sister   . Hypertension Sister   . Diabetes Brother   . Hypertension Brother   . Stroke Neg Hx     No Known Allergies   REVIEW OF SYSTEMS  General: [ ]  Weight loss, [ ]  Fever, [ ]  chills [x]  weight gain Neurologic: [ ]  Dizziness, [ ]  Blackouts, [ ]  Seizure [ ]  Stroke, [ ]  "Mini stroke", [ ]  Slurred speech, [ ]  Temporary blindness; [ ]  weakness in arms or legs, [ ]  Hoarseness [ ]  Dysphagia Cardiac: [ ]  Chest pain/pressure, [x ] Shortness of breath at rest [x ] Shortness of breath with exertion, [ ]  Atrial fibrillation or irregular heartbeat  Vascular: [ ]  Pain in legs with walking, [ ]  Pain in legs at rest, [ ]  Pain in legs at night,  [ ]  Non-healing ulcer, [ ]  Blood clot in vein/DVT,   Pulmonary: [ ]  Home oxygen, [x ] Productive cough, [ ]  Coughing up blood, [ ]  Asthma,  [ ]  Wheezing [ ]  COPD Musculoskeletal:  [ ]  Arthritis, [ ]   Low back pain, [ ]  Joint pain Hematologic: [ ]   Easy Bruising, [ ]  Anemia; [ ]  Hepatitis Gastrointestinal: [ ]  Blood in stool, [ ]  Gastroesophageal Reflux/heartburn, Urinary: [x ] chronic Kidney disease, [ ]  on HD - [ ]  MWF or [ ]  TTHS, [ ]  Burning with urination, [ ]  Difficulty urinating Skin: [ ]  Rashes, [ ]  Wounds Psychological: [ ]  Anxiety, [ ]  Depression  Physical Examination Filed Vitals:   07/09/13 0000 07/09/13 0121 07/09/13 0500 07/09/13 0800  BP: 159/85     Pulse: 94 99    Temp:    98.4 F (36.9 C)  TempSrc:    Oral  Resp:  16    Height:      Weight:   242 lb 1 oz (109.8 kg)   SpO2: 98% 99%     Body mass index is 34.73 kg/(m^2).  General:  WDWN in NAD Gait: Normal HENT: WNL Eyes: Pupils equal Pulmonary: normal non-labored breathing crackles on inspiration Cardiac: RRR Abdomen: soft, NT Skin: no rashes, ulcers noted;  no Gangrene , no cellulitis; no open wounds;   Vascular Exam/Pulses:Palpable pulses radial, brachial, femoral DP/PT   Musculoskeletal: no muscle wasting or atrophy; no edema  Neurologic: A&O X 3; Appropriate Affect ;  SENSATION: normal; MOTOR FUNCTION: 5/5 Symmetric Speech is fluent/normal   Significant Diagnostic Studies: CBC Lab Results  Component Value Date   WBC 10.2 07/06/2013   HGB 9.2* 07/06/2013   HCT 27.0* 07/06/2013   MCV 97.5 07/06/2013   PLT 263 07/06/2013    BMET    Component Value Date/Time   NA 139 07/09/2013 0515   K 4.0 07/09/2013 0515   CL 102 07/09/2013 0515   CO2 24 07/09/2013 0515   GLUCOSE 87 07/09/2013 0515   BUN 55* 07/09/2013 0515   CREATININE 5.55* 07/09/2013 0515   CREATININE 4.47* 06/15/2013 1130   CALCIUM 8.9 07/09/2013 0515   GFRNONAA 11* 07/09/2013 0515   GFRAA 13* 07/09/2013 0515   Estimated Creatinine Clearance: 20.2 ml/min (by C-G formula based on Cr of 5.55).  COAG No results found for this basename: INR, PROTIME     Non-Invasive Vascular Imaging: Vein mapping ordered  ASSESSMENT/PLAN:   AKD on CKD not on HD currently due to Hypertension. We will order vein mapping and he can follow up as an out patient.  He is right hand dominant. He will be discharged tomorrow.  I will send message for follow up to the office.    Laurence Slate [facility] 07/09/2013 8:59 AM  I have examined the patient, reviewed and agree with above. Discussed options of a dialysis catheter versus long-term dialysis via AV graft or AV fistula. The patient has normal pulses in both wrists. Does not have very large surface veins by physical exam. Will check vein mapping to make recommendations for access.  EARLY, TODD, MD 07/09/2013 10:21 AM

## 2013-07-09 NOTE — Significant Event (Signed)
Taken by transport to Vascular lab via wheelchair on Telemetry. Report given to receiving RN at [phone] and informed them that he will be transported there after the procedure. Belongings taken by NT to his new room.

## 2013-07-10 LAB — BASIC METABOLIC PANEL
BUN: 52 mg/dL — ABNORMAL HIGH (ref 6–23)
CO2: 22 mEq/L (ref 19–32)
Calcium: 9 mg/dL (ref 8.4–10.5)
Chloride: 101 mEq/L (ref 96–112)
Glucose, Bld: 88 mg/dL (ref 70–99)
Potassium: 3.9 mEq/L (ref 3.5–5.1)
Sodium: 139 mEq/L (ref 135–145)

## 2013-07-10 MED ORDER — FLUTICASONE PROPIONATE 50 MCG/ACT NA SUSP: 1.0000 | Freq: Every day | NASAL | Status: DC

## 2013-07-10 MED ORDER — DOXAZOSIN MESYLATE 4 MG PO TABS: 4.0000 mg | ORAL_TABLET | Freq: Every day | ORAL | Status: DC

## 2013-07-10 MED ORDER — CARVEDILOL 25 MG PO TABS: 50.0000 mg | ORAL_TABLET | Freq: Two times a day (BID) | ORAL | Status: DC

## 2013-07-10 MED ORDER — FUROSEMIDE 40 MG PO TABS: 40.0000 mg | ORAL_TABLET | Freq: Two times a day (BID) | ORAL | Status: DC

## 2013-07-10 MED ORDER — GUAIFENESIN-CODEINE 100-10 MG/5ML PO SOLN: 5.0000 mL | Freq: Every evening | ORAL | Status: DC | PRN

## 2013-07-10 NOTE — Progress Notes (Signed)
Patient ID: Darrell Johnson, male   DOB: 01-Dec-1965, 47 y.o.   MRN: [id number] S:feels better O:BP 155/82  Pulse 101  Temp(Src) 98.7 F (37.1 C) (Oral)  Resp 18  Ht 5\' 10"  (1.778 m)  Wt 109.8 kg (242 lb 1 oz)  BMI 34.73 kg/m2  SpO2 99%  Intake/Output Summary (Last 24 hours) at 07/10/13 1144 Last data filed at 07/10/13 1002  Gross per 24 hour  Intake    720 ml  Output   1700 ml  Net   -980 ml   Intake/Output: I/O last 3 completed shifts: In: [id number] [P.O.:1110; I.V.:6] Out: 3350 [Urine:3350]  Intake/Output this shift:  Total I/O In: 240 [P.O.:240] Out: -  Weight change:  Gen:WD WN AAM in NAD CVS:no  rub Resp:scattered rhonchi [id number], +BS, soft Ext:tr pedal edema   Recent Labs Lab 07/06/13 1745  07/07/13 0500 07/07/13 1900 07/08/13 0540 07/08/13 1845 07/09/13 0515 07/09/13 1800 07/10/13 0520  NA 142  < > 141 137 142 137 139 138 139  K 3.4*  < > 3.8 3.3* 3.8 3.5 4.0 4.1 3.9  CL 98  < > 99 99 104 100 102 101 101  CO2 30  < > 28 26 24 25 24 25 22   GLUCOSE 80  < > 79 165* 86 150* 87 92 88  BUN 65*  < > 63* 58* 59* 56* 55* 52* 52*  CREATININE 5.28*  < > 5.22* 5.30* 5.46* 5.43* 5.55* 5.36* 5.55*  ALBUMIN 2.8*  --   --   --   --   --   --   --   --   CALCIUM 7.9*  < > 8.3* 8.4 8.6 8.5 8.9 9.2 9.0  AST 21  --   --   --   --   --   --   --   --   ALT 13  --   --   --   --   --   --   --   --   < > = values in this interval not displayed. Liver Function Tests:  Recent Labs Lab 07/06/13 1745  AST 21  ALT 13  ALKPHOS 118*  BILITOT 0.1*  PROT 7.0  ALBUMIN 2.8*   No results found for this basename: LIPASE, AMYLASE,  in the last 168 hours No results found for this basename: AMMONIA,  in the last 168 hours CBC:  Recent Labs Lab 07/06/13 1745  WBC 10.2  HGB 9.2*  HCT 27.0*  MCV 97.5  PLT 263   Cardiac Enzymes:  Recent Labs Lab 07/06/13 1745 07/06/13 2320 07/07/13 0500  TROPONINI <0.30 <0.30 <0.30   CBG: No results found for this basename:  GLUCAP,  in the last 168 hours  Iron Studies:  Recent Labs  07/07/13 1900  IRON 47  TIBC 234  FERRITIN 275   Studies/Results: No results found. Marland Kitchen amLODipine  10 mg Oral Daily  . aspirin EC  81 mg Oral Daily  . carvedilol  50 mg Oral BID WC  . darunavir  800 mg Oral Q breakfast  . doxazosin  4 mg Oral QHS  . febuxostat  40 mg Oral Daily  . fluticasone  1-2 spray Each Nare Daily  . furosemide  80 mg Oral BID  . heparin  5,000 Units Subcutaneous Q8H  . lamiVUDine  100 mg Oral Daily  . [START ON 07/11/2013] loratadine  10 mg Oral Q48H  . nicotine  21 mg Transdermal Daily  . pravastatin  40 mg Oral q1800  . raltegravir  400 mg Oral BID  . ritonavir  100 mg Oral Q breakfast  . sodium chloride  3 mL Intravenous Q12H  . zidovudine  300 mg Oral Q12H    BMET    Component Value Date/Time   NA 139 07/10/2013 0520   K 3.9 07/10/2013 0520   CL 101 07/10/2013 0520   CO2 22 07/10/2013 0520   GLUCOSE 88 07/10/2013 0520   BUN 52* 07/10/2013 0520   CREATININE 5.55* 07/10/2013 0520   CREATININE 4.47* 06/15/2013 1130   CALCIUM 9.0 07/10/2013 0520   GFRNONAA 11* 07/10/2013 0520   GFRAA 13* 07/10/2013 0520   CBC    Component Value Date/Time   WBC 10.2 07/06/2013 1745   RBC 2.73* 07/07/2013 1900   RBC 2.77* 07/06/2013 1745   HGB 9.2* 07/06/2013 1745   HCT 27.0* 07/06/2013 1745   PLT 263 07/06/2013 1745   MCV 97.5 07/06/2013 1745   MCH 33.2 07/06/2013 1745   MCHC 34.1 07/06/2013 1745   RDW 15.5 07/06/2013 1745   LYMPHSABS 3.0 06/10/2013 1924   MONOABS 0.8 06/10/2013 1924   EOSABS 0.3 06/10/2013 1924   EOSABS 0.2 K/UL 05/26/2006 1229   BASOSABS 0.0 06/10/2013 1924     Assessment/Plan:  1. 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 Agree with holding ACE given AKI. No indication for HD at this time. Scr stabilized following sig diuresis and BP control.  1. Discussed the ways to delay the progression of CKD with  1. Tight BP control with goal  <130/80 2. Use of an ACE/ARB (on hold given AKI) 3. Avoidance of nephrotoxic agents such as NSAIDs/COX-II I's/IV contrast 4. Continued use of HAART 2. Will need to educate about RRT and its options 3. Vein mapping and placement of vascular access. Would ask [facility] to evaluate while he is an inpt (Scr has risen significantly over the last 6 months) 4. Stress the importance of compliance with meds and medical follow up 5. To follow up with Dr. Moshe Cipro December 29th (per his recollection) and encouraged him to attend the kidney options class to learn more about his options for RRT. 2. Hypertensive emergency- c/b AKI/CKD, pulm edema/CHF. Improving with meds but not at goal. .  STarted on doxazosyn 4mg  qhs.  Long-acting agents that are generic to help improve compliance and avoid meds such as clonidine which can worsen BP with nonadherence 3. Pulm edema/volume excess- diuresing well and agree with switching to po lasix. Follow I's/O's and daily Scr.  4. HIV- cont with HAART 5. Anemia of chronic disease- might benefit from Aranesp one BP and pulm edema have improved.  1. Low iron and TSAT. Consider: Feraheme 1020mg  IV x 1 dose and cont to follow. 6. Protein malnutrition- dietician eval 7. Vascular access: Would recommend vein mapping and consultation with [facility] for access placement. (his brother has h/o hypercoagulability and multiple clotted accesses). 8. Dispo- per primary svc hopeful discharge today.   9.   Darrell Johnson A

## 2013-07-10 NOTE — Progress Notes (Signed)
Subjective:  Darrell Johnson. Patient doing well. BP controlled Johnson current regimen. Pt excited to get home hopefully today.  Objective: Vital signs in last 24 hours: Filed Vitals:   07/09/13 1242 07/09/13 1500 07/09/13 2208 07/10/13 0538  BP: 152/86 161/94 140/79 155/82  Pulse: 86 90 96 101  Temp: 98.8 F (37.1 C) 98.6 F (37 C) 98.8 F (37.1 C) 98.7 F (37.1 C)  TempSrc: Oral Oral Oral Oral  Resp: 16 18 18 18   Height:      Weight:      SpO2: 99% 100% 99% 99%   Weight change:   Intake/Output Summary (Last 24 hours) at 07/10/13 0710 Last data filed at 07/10/13 0539  Gross per 24 hour  Intake    840 ml  Output   2500 ml  Net  -1660 ml   Vitals reviewed. General: Sitting up in chair, in NAD HEENT: no scleral icterus Cardiac: RRR, no rubs, murmurs or gallops Pulm: clear to auscultation bilaterally, no wheezes, rales, or rhonchi Abd: soft, nontender, non distended (improved since admission), BS present Ext: warm and well perfused, 1+ pedal edema bilaterally up to his knees Neuro: alert and oriented X3, moves all extremities voluntairly   Lab Results: Basic Metabolic Panel:  Recent Labs Lab 07/06/13 2320  07/09/13 0515 07/09/13 1800  NA 138  < > 139 138  K 3.3*  < > 4.0 4.1  CL 95*  < > 102 101  CO2 27  < > 24 25  GLUCOSE 78  < > 87 92  BUN 64*  < > 55* 52*  CREATININE 5.28*  < > 5.55* 5.36*  CALCIUM 8.2*  < > 8.9 9.2  MG 1.5  --   --   --   < > = values in this interval not displayed. Liver Function Tests:  Recent Labs Lab 07/06/13 1745  AST 21  ALT 13  ALKPHOS 118*  BILITOT 0.1*  PROT 7.0  ALBUMIN 2.8*   CBC:  Recent Labs Lab 07/06/13 1745  WBC 10.2  HGB 9.2*  HCT 27.0*  MCV 97.5  PLT 263   Cardiac Enzymes:  Recent Labs Lab 07/06/13 1745 07/06/13 2320 07/07/13 0500  TROPONINI <0.30 <0.30 <0.30   BNP:  Recent Labs Lab 07/06/13 1745  PROBNP 5924.0*   Thyroid Function Tests:  Recent Labs Lab 07/06/13 1745  TSH 1.214   Anemia  Panel:  Recent Labs Lab 07/07/13 1900  VITAMINB12 614  FOLATE 8.4  FERRITIN 275  TIBC 234  IRON 47  RETICCTPCT 1.7   Urine Drug Screen: Drugs of Abuse     Component Value Date/Time   LABOPIA POSITIVE* 07/06/2013 1808   LABOPIA PPS 11/12/2012 1155   COCAINSCRNUR NONE DETECTED 07/06/2013 1808   COCAINSCRNUR NEG 11/12/2012 1155   LABBENZ NONE DETECTED 07/06/2013 1808   LABBENZ NEG 11/12/2012 1155   AMPHETMU NONE DETECTED 07/06/2013 1808   THCU NONE DETECTED 07/06/2013 1808   LABBARB NONE DETECTED 07/06/2013 1808   LABBARB NEG 11/12/2012 1155    Urinalysis:  Recent Labs Lab 07/06/13 1808  COLORURINE YELLOW  LABSPEC 1.013  PHURINE 7.5  GLUCOSEU NEGATIVE  HGBUR SMALL*  BILIRUBINUR NEGATIVE  KETONESUR NEGATIVE  PROTEINUR >300*  UROBILINOGEN 0.2  NITRITE NEGATIVE  LEUKOCYTESUR NEGATIVE    Micro Results: Recent Results (from the past 240 hour(s))  MRSA PCR SCREENING     Status: None   Collection Time    07/06/13  3:23 PM      Result Value Range  Status   MRSA by PCR NEGATIVE  NEGATIVE Final   Comment:            The GeneXpert MRSA Assay (FDA     approved for NASAL specimens     only), is one component of a     comprehensive MRSA colonization     surveillance program. It is not     intended to diagnose MRSA     infection nor to guide or     monitor treatment for     MRSA infections.  RESPIRATORY VIRUS PANEL     Status: None   Collection Time    07/06/13  6:38 PM      Result Value Range Status   Source - RVPAN NASAL SWAB   Corrected   Comment: CORRECTED Johnson 12/18 AT 1836: PREVIOUSLY REPORTED AS NASAL SWAB   Respiratory Syncytial Virus A NOT DETECTED   Final   Respiratory Syncytial Virus B NOT DETECTED   Final   Influenza A NOT DETECTED   Final   Influenza B NOT DETECTED   Final   Parainfluenza 1 NOT DETECTED   Final   Parainfluenza 2 NOT DETECTED   Final   Parainfluenza 3 NOT DETECTED   Final   Metapneumovirus NOT DETECTED   Final   Rhinovirus NOT DETECTED    Final   Adenovirus NOT DETECTED   Final   Influenza A H1 NOT DETECTED   Final   Influenza A H3 NOT DETECTED   Final   Comment: (NOTE)           Normal Reference Range for each Analyte: NOT DETECTED     Testing performed using the Luminex xTAG Respiratory Viral Panel test     kit.     This test was developed and its performance characteristics determined     by [facility]. It has not been cleared or approved by the [place]     Food and Drug Administration. This test is used for clinical purposes.     It should not be regarded as investigational or for research. This     laboratory is certified under the [place] (CLIA) as qualified to perform high complexity     clinical laboratory testing.     Performed at [facility]    Medications: I have reviewed the patient's current medications. Scheduled Meds: . amLODipine  10 mg Oral Daily  . aspirin EC  81 mg Oral Daily  . carvedilol  50 mg Oral BID WC  . darunavir  800 mg Oral Q breakfast  . doxazosin  4 mg Oral QHS  . febuxostat  40 mg Oral Daily  . fluticasone  1-2 spray Each Nare Daily  . furosemide  80 mg Oral BID  . heparin  5,000 Units Subcutaneous Q8H  . lamiVUDine  100 mg Oral Daily  . [START Johnson 07/11/2013] loratadine  10 mg Oral Q48H  . nicotine  21 mg Transdermal Daily  . pravastatin  40 mg Oral q1800  . raltegravir  400 mg Oral BID  . ritonavir  100 mg Oral Q breakfast  . sodium chloride  3 mL Intravenous Q12H  . zidovudine  300 mg Oral Q12H   Continuous Infusions:  PRN Meds:.sodium chloride, acetaminophen, guaiFENesin, guaiFENesin-codeine, levalbuterol, ondansetron (ZOFRAN) IV, ondansetron, sodium chloride Assessment/Plan: 47 year old man with PMH of CKD stage 4, HTN, presenting with accelerated hypertension with BP of 220/130.   Accelerated  Hypertension - BP 220/130 with LE and abdominal distention and pulmonary dema with chest tightness, hypertensive  emergency. This was in the setting of medication noncompliance with 2-3 days of no diuretic or antihypertensive use in the context of CKD4 with subsequent volume overload. He diuresed well with Lasix 80mg  BID IV, ~7L net negative since admission with weight down to 242lbs from 252lbs Johnson admission. His dry weight is unknown but is significantly less volume overloaded today, per physical exam. His BP was initially controlled with nicardipine drip and oral antihypertensives but this drip was discontinued yesterday afternoon. He started doxazosin 4mg  qHS last night with good response. This morning his BP is elevated but he has not received his oral anti-hypertensive as of yet. As renal artery stenosis could explain his resistant hypertension, a renal artery [place] which did not show evidence of RAS.  - Continue home amlodipine 10 mg.  - Continue Coreg 50mg  BID.  - Continue Doxazosin 4mg  qHS - Continue Lasix 80mg  BID - Hold lisinopril 40 mg due to AKI  - Renal artery duplex  - Nephrology following, appreciate recommendations.    Cough/Nasal congestion -Multifactorial with URI symptoms and volume overload. He remains afebrile. Respiratory virus panel negative. His cough is scantly productive of white sputum with PNA unlikely though he could have atypical PNA. He reports compliance with HIV meds. CD4 600 Johnson 9/15 and viral load was undetectable Johnson 9/15. PCP PNA unlikely. This has improved with good diuresis and Flonase nasal spray.   - Continue Flonase nasal spray - Will start Robitussin today with Robitussin AC qHS PRN cough this evening (may try tessalon perls, or Tussionex if no improvement) - xopenex neb q 6 hr PRN for wheezing/shortness of breath - Continue Claritin (renally dosed)  Chronic renal failure, CKD stage 4-  He presented volume overloaded which was likely due to CKD. CHF exacerbation unlikely given 2D echo Johnson 06/12/13 with EF 0000000 and no diastolic dysfunction. His Creatinine has trended up  gradually in the past months and he may be approaching the need for HD. Nephrology has been following him with ongoing counseling and education in regards to possible need for dialysis in the near future. Vascular Surgery has been consulted for vein mapping and outpatient follow up.  -Appreciate Nephrology's recommendations -Appreciated Vascular Surgery's recommendation  -f/u renal artery duplex   HIV-Appears stable, will continue home meds as follows. Currently Johnson:  raltagravir 400 mg BID  darunavir 800 mg daily with breakfast  zidovudine 300 BID  lamivudine 100 mg daily.  ritonovir 100 mg daily with breakfast   Gout - No recent flare up. Discontinue colchicine home dose in the setting of worsening CKD and concurrent use of ART. Will continue Uloric but monitor closely as there are no FDA/US dose adjustments for decreased renal function for this medication at this time.    HLD -Continue statin but may need dose adjustment based Johnson his renal function.    Diet: NPO until renal artery duplex then resume renal diet.   DVT prophylaxis: heparin TID.   Dispo: Likely discharge today. Disposition is deferred at this time, awaiting improvement of current medical problems.  Anticipated discharge in approximately tomorrow.   The patient does have a current PCP (Lesly Dukes, MD) and does need an [facility] hospital follow-up appointment after discharge.  The patient does not have transportation limitations that hinder transportation to clinic appointments.  .Services Needed at time of discharge: Y = Yes, Blank = No PT:   OT:   RN:  Equipment:   Other:     LOS: 4 days   Marrion Coy, MD 07/10/2013, 7:10 AM

## 2013-07-10 NOTE — Discharge Summary (Signed)
Name: Darrell Johnson MRN: [id number] DOB: [birth year] 47 y.o. PCP: Lesly Dukes, MD  Date of Admission: 07/06/2013  3:16 PM Date of Discharge: 07/10/2013 Attending Physician: Sid Falcon, MD  Discharge Diagnosis:  Active Problems:   Accelerated hypertension CKD  Discharge Medications:   Medication List    STOP taking these medications       lisinopril 40 MG tablet  Commonly known as:  PRINIVIL,ZESTRIL      TAKE these medications       amLODipine 10 MG tablet  Commonly known as:  NORVASC  Take 10 mg by mouth daily.     carvedilol 25 MG tablet  Commonly known as:  COREG  Take 2 tablets (50 mg total) by mouth 2 (two) times daily with a meal.     colchicine 0.6 MG tablet  Take 0.6 mg by mouth every other day.     Darunavir Ethanolate 800 MG tablet  Commonly known as:  PREZISTA  Take 1 tablet (800 mg total) by mouth daily with breakfast.     doxazosin 4 MG tablet  Commonly known as:  CARDURA  Take 1 tablet (4 mg total) by mouth at bedtime.     febuxostat 40 MG tablet  Commonly known as:  ULORIC  Take 40 mg by mouth daily.     fluticasone 50 MCG/ACT nasal spray  Commonly known as:  FLONASE  Place 1-2 sprays into both nostrils daily.     furosemide 40 MG tablet  Commonly known as:  LASIX  Take 1 tablet (40 mg total) by mouth 2 (two) times daily. Please take an additional 40 mg if you notice increase lower extremity swelling.     guaiFENesin-codeine 100-10 MG/5ML syrup  Take 5 mLs by mouth at bedtime as needed for cough.     HYDROcodone-acetaminophen 10-325 MG per tablet  Commonly known as:  NORCO  Take 1 tablet by mouth every 8 (eight) hours as needed for pain.     lamivudine 100 MG tablet  Commonly known as:  EPIVIR  Take 1 tablet (100 mg total) by mouth daily.     pravastatin 40 MG tablet  Commonly known as:  PRAVACHOL  Take 1 tablet (40 mg total) by mouth daily at 6 PM.     raltegravir 400 MG tablet  Commonly known as:  ISENTRESS  Take 400  mg by mouth 2 (two) times daily.     ritonavir 100 MG Tabs tablet  Commonly known as:  NORVIR  Take 1 tablet (100 mg total) by mouth daily with breakfast.     zidovudine 300 MG tablet  Commonly known as:  RETROVIR  Take 1 tablet (300 mg total) by mouth 2 (two) times daily.        Disposition and follow-up:   Darrell Johnson was discharged from [facility] in Stable condition.  At the hospital follow up visit please address:  1.  CKD, HTN , Volume Status, Medication Compliance.  2.  Labs / imaging needed at time of follow-up: Needs follow up appointments scheduled with renal (Dr. Moshe Cipro) and vascular surgery (Dr. Donnetta Hutching).  3.  Pending labs/ test needing follow-up: None  Follow-up Appointments:     Follow-up Information   Follow up with Lesly Dukes, MD On 08/03/2013. (2:15 pmp)    Specialty:  Internal Medicine   Contact information:   [place] [address] [phone]       Discharge Instructions:  Future Appointments Provider Department  Dept Phone   07/18/2013 11:30 AM [facility] for Infectious Disease [phone]   08/03/2013 2:15 PM Lesly Dukes, MD [place] [phone]   08/04/2013 11:15 AM Carlyle Basques, MD [facility] for Infectious Disease [phone]      Consultations: Treatment Team:  Donetta Potts, MD Rosetta Posner, MD  Procedures Performed:  Portable Chest 1 View  07/06/2013   CLINICAL DATA:  Difficulty breathing.  EXAM: PORTABLE CHEST - 1 VIEW  COMPARISON:  06/10/2013.  FINDINGS: Interim partial clearing of bilateral dense pulmonary alveolar infiltrates noted. There is mild to moderate residua. Persistent right-sided pleural effusion. Persistent cardiomegaly. These findings consistent with clearing congestive heart failure and pulmonary edema with residual pulmonary edema present. No pneumothorax. No acute bony abnormality.   IMPRESSION: Interim partial clearing of congestive heart failure and pulmonary edema. Persistent pulmonary edema remains. Small right pleural effusion remains.   Electronically Signed   By: Marcello Moores  Register   On: 07/06/2013 18:20   Dg Chest Portable 1 View  06/10/2013   CLINICAL DATA:  Shortness of breath  EXAM: PORTABLE CHEST - 1 VIEW  COMPARISON:  05/04/2009  FINDINGS: Heart size upper normal to mildly enlarged. Perihilar vascular congestion. Bilateral perihilar hazy consolidation with air bronchograms on the left. Small right pleural effusion.  IMPRESSION: Findings most suggestive of acute pulmonary edema.   Electronically Signed   By: Skipper Cliche M.D.   On: 06/10/2013 20:07   Admission HPI:  Darrell Johnson is a 47 y.o. man with PMH of gout (bilateral knees), HTN, HLD, CKD, history of syphilis in 1997, HIV on HAART (CD4 600, VL<20 in 03/2013), who presents to the clinic for routine follow up.  Patient reports cough and shortness of breath for about one month, which he attributes to a cold. He tried taking Robitussin, NyQuil, Mucinex without relief but has not seen a doctor until today. His "cold" gets worse when he is lying flat on his back, so he has been sleeping propped up in bed for the past month. He's gained about 5 kg since his last visit. Denies worsening swelling in his legs. Denies chest pain, headache, weakness, numbness, abdominal pain, nausea, vomiting.  In 11/14 he was admitted to [facility] for hypertensive emergency complicated by acute respiratory failure 2/2 acute pulmonary edema and AKI. He says he feels worse today than he did at that time.  In terms of medications he should be taking Coreg 25 mg bid, lisinopril 40 mg daily, lasix 40 mg daily, amlodipine 10 mg daily. Compliance has been in question in the past as his heart rate is rarely beta blocked when he comes in for clinic visits. Today he says he is taking all his medications except for Lasix.  For his HIV he follows  with [name], who will see him again in January. Viral load was undetectable at his last visit in September. He got a flu shot in her office.  Patient denies drug use and alcohol use.   Hospital Course by problem list: Active Problems:   Accelerated hypertension   Accelerated Hypertension - The patient was admitted with HTN emergency given BP 220/130 with LE and abdominal distention and pulmonary dema with chest tightness. This was in the setting of medication noncompliance with 2-3 days of no diuretic or antihypertensive use. Further he has CKD4 complicated by volume overload. He diuresed well with Lasix 80mg  BID IV, ~7L net negative since admission (weight down  to 242lbs from 252lbs on admission). His dry weight is unknown but is significantly less volume overloaded today by physical exam. His BP was initially controlled with nicardipine drip and oral antihypertensives but this drip was discontinued two days before discharge. He started doxazosin 4mg  qHS with good response. As renal artery stenosis could explain his resistant hypertension, a renal artery [place] which did not show evidence of RAS. The patient was discharged on home amlodipine 10 mg, Coreg was increased to 50mg  BID, and Doxazosin 4mg  qHS was started. We decided to increase the patients home lasix dose to Lasix 40mg  BID with an extra 40 mg in the morning if we gains weight or his legs appear swollen.    Cough/Nasal congestion Multifactorial with URI symptoms and volume overload. He remains afebrile. Respiratory virus panel negative. His cough is scantly productive of white sputum with PNA unlikely. He reports compliance with HIV meds. CD4 600 on 9/15 and viral load was undetectable on 9/15. PCP PNA unlikely. This has improved with good diuresis and Flonase nasal spray.   Chronic renal failure, CKD stage 4- He presented volume overloaded which was likely due to CKD. CHF exacerbation unlikely given 2D echo on 06/12/13 with EF 0000000 and no  diastolic dysfunction. His Creatinine has trended up gradually in the past months and he may be approaching the need for HD. Nephrology has been following him with ongoing counseling and education in regards to possible need for dialysis in the near future. Vascular Surgery has been consulted for vein mapping and outpatient follow up. As patient was discharged on Sunday, he needs appointments with Dr. Donnetta Hutching and Dr. Moshe Cipro.  HIV-Appears stable, will continue home meds as follows. Currently on:  raltagravir 400 mg BID  darunavir 800 mg daily with breakfast  zidovudine 300 BID  lamivudine 100 mg daily.  ritonovir 100 mg daily with breakfast   Gout - No recent flare up. Discontinue colchicine home dose in the setting of worsening CKD and concurrent use of ART. Will continue Uloric but monitor closely as there are no FDA/US dose adjustments for decreased renal function for this medication at this time.   HLD -Continue statin but may need dose adjustment based on his renal function.    Discharge Vitals:   BP 155/82  Pulse 101  Temp(Src) 98.7 F (37.1 C) (Oral)  Resp 18  Ht 5\' 10"  (1.778 m)  Wt 242 lb 1 oz (109.8 kg)  BMI 34.73 kg/m2  SpO2 99%  Discharge Labs:  Results for orders placed during the hospital encounter of 07/06/13 (from the past 24 hour(s))  BASIC METABOLIC PANEL     Status: Abnormal   Collection Time    07/09/13  6:00 PM      Result Value Range   Sodium 138  135 - 145 mEq/L   Potassium 4.1  3.5 - 5.1 mEq/L   Chloride 101  96 - 112 mEq/L   CO2 25  19 - 32 mEq/L   Glucose, Bld 92  70 - 99 mg/dL   BUN 52 (*) 6 - 23 mg/dL   Creatinine, Ser 5.36 (*) 0.50 - 1.35 mg/dL   Calcium 9.2  8.4 - 10.5 mg/dL   GFR calc non Af Amer 11 (*) >90 mL/min   GFR calc Af Amer 13 (*) >90 mL/min  BASIC METABOLIC PANEL     Status: Abnormal   Collection Time    07/10/13  5:20 AM      Result Value Range   Sodium 139  135 - 145 mEq/L   Potassium 3.9  3.5 - 5.1 mEq/L   Chloride 101  96  - 112 mEq/L   CO2 22  19 - 32 mEq/L   Glucose, Bld 88  70 - 99 mg/dL   BUN 52 (*) 6 - 23 mg/dL   Creatinine, Ser 5.55 (*) 0.50 - 1.35 mg/dL   Calcium 9.0  8.4 - 10.5 mg/dL   GFR calc non Af Amer 11 (*) >90 mL/min   GFR calc Af Amer 13 (*) >90 mL/min    Signed: Marrion Coy, MD 07/10/2013, 11:29 AM   Time Spent on Discharge: 35 minutes Services Ordered on Discharge: None Equipment Ordered on Discharge: None

## 2013-07-11 ENCOUNTER — Telehealth: Payer: Self-pay | Admitting: Vascular Surgery

## 2013-07-11 NOTE — Telephone Encounter (Addendum)
Message copied by Gena Fray on Mon Jul 11, 2013  2:53 PM ------      Message from: Ulyses Amor      Created: Sat Jul 09, 2013  9:28 AM       New consult for dialysis access vein mapping ordered in hospital needs follow up visit in 2-3 weeks for access plan.  Dr. Sherren Mocha Early. ------  07/11/13: spoke with pt, dpm

## 2013-07-12 NOTE — Discharge Summary (Signed)
I saw Mr. Carrara on day of discharge and agree with discharge plan.

## 2013-07-15 DIAGNOSIS — D631 Anemia in chronic kidney disease: Secondary | ICD-10-CM | POA: Diagnosis not present

## 2013-07-15 DIAGNOSIS — N184 Chronic kidney disease, stage 4 (severe): Secondary | ICD-10-CM | POA: Diagnosis not present

## 2013-07-15 DIAGNOSIS — Z21 Asymptomatic human immunodeficiency virus [HIV] infection status: Secondary | ICD-10-CM | POA: Diagnosis not present

## 2013-07-15 DIAGNOSIS — I129 Hypertensive chronic kidney disease with stage 1 through stage 4 chronic kidney disease, or unspecified chronic kidney disease: Secondary | ICD-10-CM | POA: Diagnosis not present

## 2013-07-18 ENCOUNTER — Other Ambulatory Visit: Payer: Medicare Other

## 2013-07-18 ENCOUNTER — Other Ambulatory Visit (HOSPITAL_COMMUNITY)
Admission: RE | Admit: 2013-07-18 | Discharge: 2013-07-18 | Disposition: A | Discharge status: Home / Self Care | Payer: Medicare Other | Source: Ambulatory Visit | Attending: Internal Medicine | Admitting: Internal Medicine

## 2013-07-18 DIAGNOSIS — Z113 Encounter for screening for infections with a predominantly sexual mode of transmission: Secondary | ICD-10-CM | POA: Insufficient documentation

## 2013-07-18 DIAGNOSIS — B20 Human immunodeficiency virus [HIV] disease: Secondary | ICD-10-CM | POA: Diagnosis not present

## 2013-07-18 LAB — CBC WITH DIFFERENTIAL/PLATELET
Eosinophils Absolute: 0.2 10*3/uL (ref 0.0–0.7)
HCT: 30.9 % — ABNORMAL LOW (ref 39.0–52.0)
Hemoglobin: 10.8 g/dL — ABNORMAL LOW (ref 13.0–17.0)
Lymphocytes Relative: 27 % (ref 12–46)
Monocytes Absolute: 0.3 10*3/uL (ref 0.1–1.0)
Monocytes Relative: 5 % (ref 3–12)
Neutro Abs: 4 10*3/uL (ref 1.7–7.7)
WBC: 6.2 10*3/uL (ref 4.0–10.5)

## 2013-07-18 LAB — COMPLETE METABOLIC PANEL WITH GFR
ALT: 11 U/L (ref 0–53)
Alkaline Phosphatase: 102 U/L (ref 39–117)
Creat: 5.07 mg/dL — ABNORMAL HIGH (ref 0.50–1.35)
GFR, Est African American: 14 mL/min — ABNORMAL LOW
GFR, Est Non African American: 12 mL/min — ABNORMAL LOW
Potassium: 4.4 mEq/L (ref 3.5–5.3)
Sodium: 136 mEq/L (ref 135–145)
Total Bilirubin: 0.2 mg/dL — ABNORMAL LOW (ref 0.3–1.2)
Total Protein: 7.1 g/dL (ref 6.0–8.3)

## 2013-07-18 LAB — RPR

## 2013-07-19 LAB — HIV-1 RNA QUANT-NO REFLEX-BLD: HIV-1 RNA Quant, Log: <1.3 {Log} (ref <1.30)

## 2013-07-19 LAB — T-HELPER CELL (CD4) - (RCID CLINIC ONLY)
CD4 % Helper T Cell: 35 % (ref 33–55)
CD4 T Cell Abs: 560 /uL (ref 400–2700)

## 2013-07-30 ENCOUNTER — Emergency Department (HOSPITAL_COMMUNITY): Payer: Medicare Other

## 2013-07-30 ENCOUNTER — Emergency Department (HOSPITAL_COMMUNITY)
Admission: EM | Admit: 2013-07-30 | Discharge: 2013-07-30 | Disposition: A | Discharge status: Home / Self Care | Payer: Medicare Other | Attending: Emergency Medicine | Admitting: Emergency Medicine

## 2013-07-30 ENCOUNTER — Encounter (HOSPITAL_COMMUNITY): Payer: Self-pay | Admitting: Emergency Medicine

## 2013-07-30 DIAGNOSIS — I129 Hypertensive chronic kidney disease with stage 1 through stage 4 chronic kidney disease, or unspecified chronic kidney disease: Secondary | ICD-10-CM | POA: Insufficient documentation

## 2013-07-30 DIAGNOSIS — G8929 Other chronic pain: Secondary | ICD-10-CM

## 2013-07-30 DIAGNOSIS — IMO0002 Reserved for concepts with insufficient information to code with codable children: Secondary | ICD-10-CM | POA: Diagnosis not present

## 2013-07-30 DIAGNOSIS — Z412 Encounter for routine and ritual male circumcision: Secondary | ICD-10-CM | POA: Diagnosis not present

## 2013-07-30 DIAGNOSIS — Z79899 Other long term (current) drug therapy: Secondary | ICD-10-CM | POA: Diagnosis not present

## 2013-07-30 DIAGNOSIS — Z21 Asymptomatic human immunodeficiency virus [HIV] infection status: Secondary | ICD-10-CM | POA: Diagnosis not present

## 2013-07-30 DIAGNOSIS — M1710 Unilateral primary osteoarthritis, unspecified knee: Secondary | ICD-10-CM | POA: Diagnosis not present

## 2013-07-30 DIAGNOSIS — M25562 Pain in left knee: Secondary | ICD-10-CM

## 2013-07-30 DIAGNOSIS — Z862 Personal history of diseases of the blood and blood-forming organs and certain disorders involving the immune mechanism: Secondary | ICD-10-CM | POA: Diagnosis not present

## 2013-07-30 DIAGNOSIS — Z8781 Personal history of (healed) traumatic fracture: Secondary | ICD-10-CM | POA: Diagnosis not present

## 2013-07-30 DIAGNOSIS — M25569 Pain in unspecified knee: Secondary | ICD-10-CM | POA: Insufficient documentation

## 2013-07-30 DIAGNOSIS — Z8619 Personal history of other infectious and parasitic diseases: Secondary | ICD-10-CM | POA: Diagnosis not present

## 2013-07-30 DIAGNOSIS — F17200 Nicotine dependence, unspecified, uncomplicated: Secondary | ICD-10-CM | POA: Diagnosis not present

## 2013-07-30 DIAGNOSIS — N184 Chronic kidney disease, stage 4 (severe): Secondary | ICD-10-CM | POA: Diagnosis not present

## 2013-07-30 DIAGNOSIS — Z8639 Personal history of other endocrine, nutritional and metabolic disease: Secondary | ICD-10-CM | POA: Insufficient documentation

## 2013-07-30 DIAGNOSIS — M109 Gout, unspecified: Secondary | ICD-10-CM | POA: Insufficient documentation

## 2013-07-30 MED ORDER — GUAIFENESIN-CODEINE 100-10 MG/5ML PO SOLN: 5.0000 mL | Freq: Three times a day (TID) | ORAL | Status: DC | PRN

## 2013-07-30 MED ORDER — ALLOPURINOL 100 MG PO TABS: 50.0000 mg | ORAL_TABLET | Freq: Every day | ORAL | Status: DC

## 2013-07-30 MED ORDER — HYDROCODONE-ACETAMINOPHEN 5-325 MG PO TABS
2.0000 | ORAL_TABLET | Freq: Once | ORAL | Status: AC
  Administered 2013-07-30: 2 via ORAL
  Filled 2013-07-30: qty 2

## 2013-07-30 MED ORDER — HYDROCODONE-ACETAMINOPHEN 5-325 MG PO TABS: 1.0000 | ORAL_TABLET | Freq: Four times a day (QID) | ORAL | Status: DC | PRN

## 2013-07-30 NOTE — ED Notes (Signed)
Patient transported to X-ray 

## 2013-07-30 NOTE — Discharge Instructions (Signed)
Arthralgia °Your caregiver has diagnosed you as suffering from an arthralgia. Arthralgia means there is pain in a joint. This can come from many reasons including: °· Bruising the joint which causes soreness (inflammation) in the joint. °· Wear and tear on the joints which occur as we grow older (osteoarthritis). °· Overusing the joint. °· Various forms of arthritis. °· Infections of the joint. °Regardless of the cause of pain in your joint, most of these different pains respond to anti-inflammatory drugs and rest. The exception to this is when a joint is infected, and these cases are treated with antibiotics, if it is a bacterial infection. °HOME CARE INSTRUCTIONS  °· Rest the injured area for as long as directed by your caregiver. Then slowly start using the joint as directed by your caregiver and as the pain allows. Crutches as directed may be useful if the ankles, knees or hips are involved. If the knee was splinted or casted, continue use and care as directed. If an stretchy or elastic wrapping bandage has been applied today, it should be removed and re-applied every 3 to 4 hours. It should not be applied tightly, but firmly enough to keep swelling down. Watch toes and feet for swelling, bluish discoloration, coldness, numbness or excessive pain. If any of these problems (symptoms) occur, remove the ace bandage and re-apply more loosely. If these symptoms persist, contact your caregiver or return to this location. °· For the first 24 hours, keep the injured extremity elevated on pillows while lying down. °· Apply ice for 15-20 minutes to the sore joint every couple hours while awake for the first half day. Then 03-04 times per day for the first 48 hours. Put the ice in a plastic bag and place a towel between the bag of ice and your skin. °· Wear any splinting, casting, elastic bandage applications, or slings as instructed. °· Only take over-the-counter or prescription medicines for pain, discomfort, or fever as  directed by your caregiver. Do not use aspirin immediately after the injury unless instructed by your physician. Aspirin can cause increased bleeding and bruising of the tissues. °· If you were given crutches, continue to use them as instructed and do not resume weight bearing on the sore joint until instructed. °Persistent pain and inability to use the sore joint as directed for more than 2 to 3 days are warning signs indicating that you should see a caregiver for a follow-up visit as soon as possible. Initially, a hairline fracture (break in bone) may not be evident on X-rays. Persistent pain and swelling indicate that further evaluation, non-weight bearing or use of the joint (use of crutches or slings as instructed), or further X-rays are indicated. X-rays may sometimes not show a small fracture until a week or 10 days later. Make a follow-up appointment with your own caregiver or one to whom we have referred you. A radiologist (specialist in reading X-rays) may read your X-rays. Make sure you know how you are to obtain your X-ray results. Do not assume everything is normal if you do not hear from us. °SEEK MEDICAL CARE IF: °Bruising, swelling, or pain increases. °SEEK IMMEDIATE MEDICAL CARE IF:  °· Your fingers or toes are numb or blue. °· The pain is not responding to medications and continues to stay the same or get worse. °· The pain in your joint becomes severe. °· You develop a fever over 102° F (38.9° C). °· It becomes impossible to move or use the joint. °MAKE SURE YOU:  °·   Understand these instructions.  Will watch your condition.  Will get help right away if you are not doing well or get worse. Document Released: 07/07/2005 Document Revised: 09/29/2011 Document Reviewed: 02/23/2008 [facility] Patient Information 2014 [place]. Gout Gout is an inflammatory arthritis caused by a buildup of uric acid crystals in the joints. Uric acid is a chemical that is normally present in the blood. When  the level of uric acid in the blood is too high it can form crystals that deposit in your joints and tissues. This causes joint redness, soreness, and swelling (inflammation). Repeat attacks are common. Over time, uric acid crystals can form into masses (tophi) near a joint, destroying bone and causing disfigurement. Gout is treatable and often preventable. CAUSES  The disease begins with elevated levels of uric acid in the blood. Uric acid is produced by your body when it breaks down a naturally found substance called purines. Certain foods you eat, such as meats and fish, contain high amounts of purines. Causes of an elevated uric acid level include:  Being passed down from parent to child (heredity).  Diseases that cause increased uric acid production (such as obesity, psoriasis, and certain cancers).  Excessive alcohol use.  Diet, especially diets rich in meat and seafood.  Medicines, including certain cancer-fighting medicines (chemotherapy), water pills (diuretics), and aspirin.  Chronic kidney disease. The kidneys are no longer able to remove uric acid well.  Problems with metabolism. Conditions strongly associated with gout include:  Obesity.  High blood pressure.  High cholesterol.  Diabetes. Not everyone with elevated uric acid levels gets gout. It is not understood why some people get gout and others do not. Surgery, joint injury, and eating too much of certain foods are some of the factors that can lead to gout attacks. SYMPTOMS   An attack of gout comes on quickly. It causes intense pain with redness, swelling, and warmth in a joint.  Fever can occur.  Often, only one joint is involved. Certain joints are more commonly involved:  Base of the big toe.  Knee.  Ankle.  Wrist.  Finger. Without treatment, an attack usually goes away in a few days to weeks. Between attacks, you usually will not have symptoms, which is different from many other forms of  arthritis. DIAGNOSIS  Your caregiver will suspect gout based on your symptoms and exam. In some cases, tests may be recommended. The tests may include:  Blood tests.  Urine tests.  X-rays.  Joint fluid exam. This exam requires a needle to remove fluid from the joint (arthrocentesis). Using a microscope, gout is confirmed when uric acid crystals are seen in the joint fluid. TREATMENT  There are two phases to gout treatment: treating the sudden onset (acute) attack and preventing attacks (prophylaxis).  Treatment of an Acute Attack.  Medicines are used. These include anti-inflammatory medicines or steroid medicines.  An injection of steroid medicine into the affected joint is sometimes necessary.  The painful joint is rested. Movement can worsen the arthritis.  You may use warm or cold treatments on painful joints, depending which works best for you.  Treatment to Prevent Attacks.  If you suffer from frequent gout attacks, your caregiver may advise preventive medicine. These medicines are started after the acute attack subsides. These medicines either help your kidneys eliminate uric acid from your body or decrease your uric acid production. You may need to stay on these medicines for a very long time.  The early phase of treatment with preventive  medicine can be associated with an increase in acute gout attacks. For this reason, during the first few months of treatment, your caregiver may also advise you to take medicines usually used for acute gout treatment. Be sure you understand your caregiver's directions. Your caregiver may make several adjustments to your medicine dose before these medicines are effective.  Discuss dietary treatment with your caregiver or dietitian. Alcohol and drinks high in sugar and fructose and foods such as meat, poultry, and seafood can increase uric acid levels. Your caregiver or dietician can advise you on drinks and foods that should be limited. HOME  CARE INSTRUCTIONS   Do not take aspirin to relieve pain. This raises uric acid levels.  Only take over-the-counter or prescription medicines for pain, discomfort, or fever as directed by your caregiver.  Rest the joint as much as possible. When in bed, keep sheets and blankets off painful areas.  Keep the affected joint raised (elevated).  Apply warm or cold treatments to painful joints. Use of warm or cold treatments depends on which works best for you.  Use crutches if the painful joint is in your leg.  Drink enough fluids to keep your urine clear or pale yellow. This helps your body get rid of uric acid. Limit alcohol, sugary drinks, and fructose drinks.  Follow your dietary instructions. Pay careful attention to the amount of protein you eat. Your daily diet should emphasize fruits, vegetables, whole grains, and fat-free or low-fat milk products. Discuss the use of coffee, vitamin C, and cherries with your caregiver or dietician. These may be helpful in lowering uric acid levels.  Maintain a healthy body weight. SEEK MEDICAL CARE IF:   You develop diarrhea, vomiting, or any side effects from medicines.  You do not feel better in 24 hours, or you are getting worse. SEEK IMMEDIATE MEDICAL CARE IF:   Your joint becomes suddenly more tender, and you have chills or a fever. MAKE SURE YOU:   Understand these instructions.  Will watch your condition.  Will get help right away if you are not doing well or get worse. Document Released: 07/04/2000 Document Revised: 11/01/2012 Document Reviewed: 02/18/2012 [facility] Patient Information 2014 [place].

## 2013-07-30 NOTE — ED Notes (Signed)
Pt reports having left knee pain, has hx of gout and states this is similar pain. Denies injrury to knee.

## 2013-07-30 NOTE — ED Provider Notes (Signed)
CSN: [id number]     Arrival date & time 07/30/13  1518 History   First MD Initiated Contact with Patient 07/30/13 1621     Chief Complaint  Patient presents with  . Knee Pain   (Consider location/radiation/quality/duration/timing/severity/associated sxs/prior Treatment) HPI Comments: Patient presents emergency department with chief complaint of left knee pain. He states that the pain started several days ago. He states that he has a history of gout, and states this feels similar. States that he has recently run out of his colchicine. Additionally, he states that he helped his son pushed a car the other day. He is uncertain whether this exacerbated his symptoms. His pain is worsened with movement, and better with rest. He denies fevers or chills.  The history is provided by the patient. No language interpreter was used.    Past Medical History  Diagnosis Date  . 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 Hypertension   . Seizures     last seizure was >5 years ago, pt has family history of seizures  . Syphilis 1997    history of syphilis 1997  . Chronic kidney disease     stage 3-4 CKD, followed by Dr. Moshe Cipro  . 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 Rib fractures 01/2009  . HIV infection 1980's    on ART therapy since, followed by ID clinic, complicated  by neuropathy  . Male circumcision 11/2005   History reviewed. No pertinent past surgical history. Family History  Problem Relation Age of Onset  . Cancer Mother   . COPD Father   . Diabetes Sister   . Hypertension Sister   . Diabetes Brother   . Hypertension Brother   . Stroke Neg Hx    History  Substance Use Topics  . Smoking status: Current Every Day Smoker -- 0.30 packs/day for 20 years    Types: Cigarettes  . Smokeless  tobacco: Never Used     Comment: Trying to cut back.  . Alcohol Use: No    Review of Systems  All other systems reviewed and are negative.    Allergies  Review of patient's allergies indicates no known allergies.  Home Medications   Current Outpatient Rx  Name  Route  Sig  Dispense  Refill  . acetaminophen (TYLENOL) 500 MG tablet   Oral   Take 1,000 mg by mouth daily as needed for mild pain.         . carvedilol (COREG) 25 MG tablet   Oral   Take 50 mg by mouth 2 (two) times daily with a meal.         . colchicine 0.6 MG tablet   Oral   Take 0.6 mg by mouth every other day.          . Darunavir Ethanolate (PREZISTA) 800 MG tablet   Oral   Take 800 mg by mouth daily with breakfast.         . doxazosin (CARDURA) 4 MG tablet   Oral   Take 4 mg by mouth at bedtime.         . febuxostat (ULORIC) 40 MG tablet   Oral   Take 40 mg by mouth daily.         . fluticasone (FLONASE) 50 MCG/ACT nasal spray  Each Nare   Place 1-2 sprays into both nostrils daily.         . furosemide (LASIX) 40 MG tablet   Oral   Take 40 mg by mouth 2 (two) times daily.         Marland Kitchen lamiVUDine-zidovudine (COMBIVIR) 150-300 MG per tablet   Oral   Take 1 tablet by mouth 2 (two) times daily.         Marland Kitchen lisinopril (PRINIVIL,ZESTRIL) 40 MG tablet   Oral   Take 40 mg by mouth daily.         . raltegravir (ISENTRESS) 400 MG tablet   Oral   Take 400 mg by mouth 2 (two) times daily.         . ritonavir (NORVIR) 100 MG capsule   Oral   Take 100 mg by mouth daily with breakfast.          BP 159/92  Pulse 92  Temp(Src) 98.7 F (37.1 C) (Oral)  Resp 18  SpO2 98% Physical Exam  Nursing note and vitals reviewed. Constitutional: He is oriented to person, place, and time. He appears well-developed and well-nourished.  HENT:  Head: Normocephalic and atraumatic.  Eyes: Conjunctivae and EOM are normal. Pupils are equal, round, and reactive to light. Right eye exhibits no  discharge. Left eye exhibits no discharge. No scleral icterus.  Neck: Normal range of motion. Neck supple. No JVD present.  Cardiovascular: Normal rate.   Pulmonary/Chest: Effort normal and breath sounds normal. No respiratory distress.  Abdominal: Soft. He exhibits no distension.  Musculoskeletal: Normal range of motion. He exhibits no edema and no tenderness.  Left knee range of motion and strength 5/5, however is painful with range of motion, no bony abnormality or deformity, no unilateral leg swelling, no sign of DVT or septic joint  Neurological: He is alert and oriented to person, place, and time.  CN 3-12 intact  Skin: Skin is warm and dry.  Left knee nonerythematous  Psychiatric: He has a normal mood and affect. His behavior is normal. Judgment and thought content normal.    ED Course  Procedures (including critical care time) Results for orders placed in visit on 07/18/13  HIV 1 RNA QUANT-NO REFLEX-BLD      Result Value Range   HIV 1 RNA Quant <20  <20 copies/mL   HIV1 RNA Quant, Log <1.30  <1.30 log 10  CBC WITH DIFFERENTIAL      Result Value Range   WBC 6.2  4.0 - 10.5 K/uL   RBC 3.28 (*) 4.22 - 5.81 MIL/uL   Hemoglobin 10.8 (*) 13.0 - 17.0 g/dL   HCT 30.9 (*) 39.0 - 52.0 %   MCV 94.2  78.0 - 100.0 fL   MCH 32.9  26.0 - 34.0 pg   MCHC 35.0  30.0 - 36.0 g/dL   RDW 16.3 (*) 11.5 - 15.5 %   Platelets 377  150 - 400 K/uL   Neutrophils Relative % 65  43 - 77 %   Neutro Abs 4.0  1.7 - 7.7 K/uL   Lymphocytes Relative 27  12 - 46 %   Lymphs Abs 1.7  0.7 - 4.0 K/uL   Monocytes Relative 5  3 - 12 %   Monocytes Absolute 0.3  0.1 - 1.0 K/uL   Eosinophils Relative 3  0 - 5 %   Eosinophils Absolute 0.2  0.0 - 0.7 K/uL   Basophils Relative 0  0 - 1 %   Basophils Absolute 0.0  0.0 - 0.1 K/uL   Smear Review Criteria for review not met    RPR      Result Value Range   RPR NON REAC  NON REAC  COMPLETE METABOLIC PANEL WITH GFR      Result Value Range   Sodium 136  135 - 145  mEq/L   Potassium 4.4  3.5 - 5.3 mEq/L   Chloride 101  96 - 112 mEq/L   CO2 24  19 - 32 mEq/L   Glucose, Bld 99  70 - 99 mg/dL   BUN 51 (*) 6 - 23 mg/dL   Creat 5.07 (*) 0.50 - 1.35 mg/dL   Total Bilirubin 0.2 (*) 0.3 - 1.2 mg/dL   Alkaline Phosphatase 102  39 - 117 U/L   AST 13  0 - 37 U/L   ALT 11  0 - 53 U/L   Total Protein 7.1  6.0 - 8.3 g/dL   Albumin 3.6  3.5 - 5.2 g/dL   Calcium 8.5  8.4 - 10.5 mg/dL   GFR, Est African American 14 (*)    GFR, Est Non African American 12 (*)   T-HELPER CELL (CD4)      Result Value Range   CD4 T Cell Abs 560  400 - 2700 /uL   CD4 % Helper T Cell 35  33 - 55 %   Portable Chest 1 View  07/06/2013   CLINICAL DATA:  Difficulty breathing.  EXAM: PORTABLE CHEST - 1 VIEW  COMPARISON:  06/10/2013.  FINDINGS: Interim partial clearing of bilateral dense pulmonary alveolar infiltrates noted. There is mild to moderate residua. Persistent right-sided pleural effusion. Persistent cardiomegaly. These findings consistent with clearing congestive heart failure and pulmonary edema with residual pulmonary edema present. No pneumothorax. No acute bony abnormality.  IMPRESSION: Interim partial clearing of congestive heart failure and pulmonary edema. Persistent pulmonary edema remains. Small right pleural effusion remains.   Electronically Signed   By: Marcello Moores  Register   On: 07/06/2013 18:20   Dg Knee Complete 4 Views Left  07/30/2013   CLINICAL DATA:  Left knee pain.  EXAM: LEFT KNEE - COMPLETE 4+ VIEW  COMPARISON:  April 02, 2009.  FINDINGS: There is no evidence of fracture, dislocation, or joint effusion. No significant joint space narrowing is noted. Minimal spurring of superior aspect of the patella as well as tibial spines is noted. Soft tissues are unremarkable.  IMPRESSION: Minimal degenerative changes are noted. No acute abnormality seen in the left knee.   Electronically Signed   By: Sabino Dick M.D.   On: 07/30/2013 17:41      EKG Interpretation    None       MDM   1. Knee pain, chronic, left     Patient with left knee pain. History of gout. No bony abnormality or deformity. Will check plain film, and will reevaluate. He stay normally takes colchicine and allopurinol, but has been out of his allopurinol.  Knee film is negative. Will discharge with some pain medicine, and refill of allopurinol. Additionally, patient brought in a prescription for cough medicine that was never signed by his PCP. He asks for another prescription. He states that his been coughing at night. No fevers, no chest pain, shortness of breath. I will replace the cough medicine prescription.  Montine Circle, PA-C 07/30/13 1755

## 2013-07-30 NOTE — ED Notes (Signed)
Ortho paged. 

## 2013-07-31 NOTE — ED Provider Notes (Signed)
Medical screening examination/treatment/procedure(s) were performed by non-physician practitioner and as supervising physician I was immediately available for consultation/collaboration.  EKG Interpretation   None         Houston Siren, MD 07/31/13 [phone]

## 2013-08-01 ENCOUNTER — Encounter: Payer: Self-pay | Admitting: Vascular Surgery

## 2013-08-02 ENCOUNTER — Ambulatory Visit: Payer: Medicare Other | Admitting: Vascular Surgery

## 2013-08-03 ENCOUNTER — Ambulatory Visit (INDEPENDENT_AMBULATORY_CARE_PROVIDER_SITE_OTHER): Payer: Medicare Other | Admitting: Internal Medicine

## 2013-08-03 ENCOUNTER — Encounter: Payer: Self-pay | Admitting: Internal Medicine

## 2013-08-03 VITALS — BP 144/80 | HR 95 | Temp 97.1°F | Ht 69.0 in | Wt 244.1 lb

## 2013-08-03 DIAGNOSIS — M109 Gout, unspecified: Secondary | ICD-10-CM | POA: Diagnosis not present

## 2013-08-03 DIAGNOSIS — N184 Chronic kidney disease, stage 4 (severe): Secondary | ICD-10-CM

## 2013-08-03 DIAGNOSIS — B20 Human immunodeficiency virus [HIV] disease: Secondary | ICD-10-CM | POA: Diagnosis not present

## 2013-08-03 DIAGNOSIS — I10 Essential (primary) hypertension: Secondary | ICD-10-CM

## 2013-08-03 DIAGNOSIS — G894 Chronic pain syndrome: Secondary | ICD-10-CM

## 2013-08-03 DIAGNOSIS — I129 Hypertensive chronic kidney disease with stage 1 through stage 4 chronic kidney disease, or unspecified chronic kidney disease: Secondary | ICD-10-CM | POA: Diagnosis not present

## 2013-08-03 LAB — BASIC METABOLIC PANEL WITH GFR
BUN: 67 mg/dL — AB (ref 6–23)
CO2: 24 mEq/L (ref 19–32)
Calcium: 8.4 mg/dL (ref 8.4–10.5)
Chloride: 102 mEq/L (ref 96–112)
Creat: 4.98 mg/dL — ABNORMAL HIGH (ref 0.50–1.35)
GFR, Est African American: 15 mL/min — ABNORMAL LOW
GFR, Est Non African American: 13 mL/min — ABNORMAL LOW
GLUCOSE: 74 mg/dL (ref 70–99)
Potassium: 5.1 mEq/L (ref 3.5–5.3)
Sodium: 139 mEq/L (ref 135–145)

## 2013-08-03 MED ORDER — HYDROCODONE-ACETAMINOPHEN 10-325 MG PO TABS: 1.0000 | ORAL_TABLET | Freq: Three times a day (TID) | ORAL | Status: DC | PRN

## 2013-08-03 MED ORDER — FEBUXOSTAT 40 MG PO TABS: 40.0000 mg | ORAL_TABLET | Freq: Every day | ORAL | Status: DC

## 2013-08-03 MED ORDER — PREDNISONE (PAK) 10 MG PO TABS: ORAL_TABLET | ORAL | Status: DC

## 2013-08-03 NOTE — Patient Instructions (Addendum)
Thank you for your visit. - I have prescribed you a taper of prednisone. This is a steroid medication to help treat your acute gout flare. Please take 50 mg, or 5 tabs, for 5 days, followed by 40 mg, 4 tabs, for 3 days, followed by 30 mg, 3 tabs, for 3 days, followed by 20 mg, 2 tabs, for 3 days, followed by 10 mg, 1 tab, for 3 days. - On day 7 of this steroid course (around 08/12/13), please restart your Uloric 40 mg daily. If you take this medicine now, it could worsen your gout flare. - I have refilled your Norco, 10-325 mg, #90. - If your left knee pain has not improved after 5 days of steroid treatment, please contact the office and we can schedule a visit next week to tap the joint and relieve the pressure. - Please continue taking your other medications, essentially your blood pressure medications, as instructed. Her blood pressure is under good control today. Keep up the good work. - Please followup with [name] tomorrow. This is your infectious disease doctor. - Please be sure to reschedule your appointment with Dr. Donnetta Hutching. It is very, very important that you followup up with your vascular Dr. about placing a fistula for dialysis in the future. Please call [phone]. - If you develop fever, chills, shortness of breath, chest pain, numbness, weakness, please call the clinic or go to the emergency department for further care. - Please return to see me in one month. We will check your blood pressure and uric acid levels at that time.

## 2013-08-03 NOTE — Assessment & Plan Note (Signed)
He is scheduled to see [name] on 08/04/13 for follow up. - Continue current medications.

## 2013-08-03 NOTE — Assessment & Plan Note (Addendum)
Refilled Norco 10-325 #90 x1 today, fill date 08/03/13. He was prescribed Norco 5-325 #13 and codeine cough syrup in the emergency department a few days ago, which is technically a violation of our contract, but we did not have time to address this today given his acute gout pain. At a future visit I will order a UDS and reiterate the terms of his contract. Narcotic database shows last Norco refill 12/8 #90.

## 2013-08-03 NOTE — Assessment & Plan Note (Addendum)
BP Readings from Last 3 Encounters:  08/03/13 144/80  07/30/13 159/92  07/10/13 155/82    Lab Results  Component Value Date   NA 136 07/18/2013   K 4.4 07/18/2013   CREATININE 5.07* 07/18/2013    Assessment: Blood pressure control: controlled Progress toward BP goal:  at goal  Plan: Medications:  continue current medications Other plans: Continue to monitor, followup in one month.

## 2013-08-03 NOTE — Assessment & Plan Note (Addendum)
Last Cr 5.07 on 12/29. He is approaching the need for dialysis. Follows with Dr. Moshe Cipro [facility]) and Dr. Donnetta Hutching ([facility]). He is optimistic about a kidney transplant, but I encouraged him to followup with Dr. Donnetta Hutching for AV fistula placement as soon as possible, as we cannot know if he will get a kidney or how long it will be until he does, so he cannot put all his eggs in that basket. - Patient was encouraged to reschedule his missed appointment with Dr. Donnetta Hutching, and provided the contact information for [facility] [place] - Check a BMP today  ADDENDUM: Cr/GFR stable. K 5.1. CO2 24.  BMET    Component Value Date/Time   NA 139 08/03/2013 1555   K 5.1 08/03/2013 1555   CL 102 08/03/2013 1555   CO2 24 08/03/2013 1555   GLUCOSE 74 08/03/2013 1555   BUN 67* 08/03/2013 1555   CREATININE 4.98* 08/03/2013 1555   CREATININE 5.55* 07/10/2013 0520   CALCIUM 8.4 08/03/2013 1555   GFRNONAA 11* 07/10/2013 0520   GFRAA 13* 07/10/2013 0520

## 2013-08-03 NOTE — Progress Notes (Signed)
Subjective:    Patient ID: Darrell Johnson, male    DOB: March 01, 1966, 48 y.o.   MRN: [id number]  HPI Darrell Johnson is a 48 y.o. man with PMH of gout (bilateral knees), HTN, HLD, CKD, history of syphilis in 1997, HIV on HAART (CD4 600, VL<20 in 03/2013), who presents to the clinic for routine follow up.   CKD stage 4 - Last Cr 5.07 on 12/29. He is approaching the need for dialysis. A renal artery [place] was performed during his hospital admission in December, which did not show evidence of renal artery stenosis. He was supposed to see Dr. Donnetta Hutching with [facility] on 1/13, but he tells me he missed the appointment. He saw Dr. Moshe Cipro (his nephrologist) on 12/26 for hospital follow up. She thinks he may be a candidate for renal transplant if he can demonstrate excellent medication compliance and his HIV stays under good control. She [facility]. She of course also recommends [facility] follow up for AVF placement.  HTN - In 12/14 he was admitted to [facility] for hypertensive emergency, BP 220/130, with lower extremity and abdominal distention and pulmonary edema with chest tightness. This was in the setting of medication noncompliance with 2-3 days of no diuretic or antihypertensive use. The patient was discharged on home amlodipine 10 mg, Coreg was increased to 50mg  BID, Doxazosin 4mg  qHS was started, and they decided to increase the patients home lasix dose to Lasix 40mg  BID with an extra 40 mg in the morning if we gains weight or his legs appear swollen. He tells me he has been compliant with these medicines. BP 144/80 today. Weight is stable from 12/20. Denies lower extremity or abdominal swelling.  Gout flare - Patient was taken off his colchicine upon hospital discharge 2/2 worsening CKD. He should be taking Uloric 40mg  daily for his gout. Uric acid level <6 in 04/27/13. However, he says he ran out of this medicine, and is currently experiencing left knee pain, swelling, and tenderness.  He was seen in the ED on 1/10 for the knee pain. XR was within normal limits. He was given a prescription for Norco #13 and the ED note says his allopurinol was refilled at that time, though he says he was told not too take Allopurinol. The ED also gave him a prescription for codeine cough syrup, though he has a medication contract with [place].  Chronic pain - Patient is on a pain contract with [place] for Norco 10-325 q8hr prn osteoarthritis and gout pain in bilateral knees #90. He violated our contract in June when he filled prescriptions for narcotics from other providers and multiple pharmacies. I resigned the contract with him on 04/27/13 and re-explained the terms. He was provided with a copy of the form.  HIV - Follows with [name], who will see him again on 1/15. He is doing well with his HAART medications.   Review of Systems  Constitutional: Negative for fever and chills.  Eyes: Negative for visual disturbance.  Respiratory: Negative for shortness of breath.   Cardiovascular: Negative for chest pain.  Gastrointestinal: Negative for nausea, vomiting and abdominal pain.  Musculoskeletal: Positive for joint swelling and myalgias.  Skin: Negative for rash.  Neurological: Negative for weakness, numbness and headaches.      Objective:   Physical Exam  Constitutional: He is oriented to person, place, and time. He appears well-developed and well-nourished.  Using crutches.  HENT:  Head: Normocephalic and atraumatic.  Eyes: Conjunctivae and EOM  are normal. Pupils are equal, round, and reactive to light.  Cardiovascular: Normal rate, regular rhythm and normal heart sounds.  Exam reveals no gallop and no friction rub.   No murmur heard. Pulmonary/Chest: Effort normal and breath sounds normal. No respiratory distress. He has no wheezes. He has no rales. He exhibits no tenderness.  Abdominal: Soft. He exhibits no distension.  Musculoskeletal: He exhibits no edema.       Left knee: He exhibits  decreased range of motion, swelling and erythema. Tenderness found.  Neurological: He is alert and oriented to person, place, and time.      Assessment & Plan:   Please see problem based charting.

## 2013-08-03 NOTE — Assessment & Plan Note (Addendum)
History and physical exam is consistent with an acute gout flare of left knee. This was likely triggered by discontinuation of his colchicine along with noncompliance with his Uloric. Treatment of gout in this patient is tricky, given his worsening CKD. - Start prednisone taper: 50 mg, or 5 tabs, for 5 days, followed by 40 mg, 4 tabs, for 3 days, followed by 30 mg, 3 tabs, for 3 days, followed by 20 mg, 2 tabs, for 3 days, followed by 10 mg, 1 tab, for 3 days - Patient was told to restart Uloric on day 7 of this taper, to avoid worsening of his acute gout flare - Patient was told to call the office if his pain is not improving within 5 days, for therapeutic joint aspiration - Check uric acid level at one-month followup appointment

## 2013-08-04 ENCOUNTER — Encounter: Payer: Self-pay | Admitting: Internal Medicine

## 2013-08-04 ENCOUNTER — Ambulatory Visit (INDEPENDENT_AMBULATORY_CARE_PROVIDER_SITE_OTHER): Payer: Medicare Other | Admitting: Internal Medicine

## 2013-08-04 VITALS — BP 165/94 | HR 92 | Temp 98.0°F | Ht 70.0 in | Wt 245.5 lb

## 2013-08-04 DIAGNOSIS — B20 Human immunodeficiency virus [HIV] disease: Secondary | ICD-10-CM

## 2013-08-04 MED ORDER — LAMIVUDINE 100 MG PO TABS: 100.0000 mg | ORAL_TABLET | Freq: Every day | ORAL | Status: DC

## 2013-08-04 MED ORDER — RITONAVIR 100 MG PO TABS: 100.0000 mg | ORAL_TABLET | Freq: Every day | ORAL | Status: DC

## 2013-08-04 MED ORDER — ZIDOVUDINE 300 MG PO TABS: 300.0000 mg | ORAL_TABLET | Freq: Two times a day (BID) | ORAL | Status: DC

## 2013-08-04 NOTE — Progress Notes (Signed)
Subjective:    Patient ID: Darrell Johnson, male    DOB: 1966/04/29, 48 y.o.   MRN: [id number]  HPI 48yo M with HIV, CKD nearly need for HD. RLG bid/DRV 800mg  daily/Rit 100mg  daily/lam 100mg  daily/zidovudine 300mg  daily. Had gouty flare this week started on steroid taper then will be switching to uloric for uric acid suppression. He notices sensitivity to left upper tooth  Current Outpatient Prescriptions on File Prior to Visit  Medication Sig Dispense Refill  . acetaminophen (TYLENOL) 500 MG tablet Take 1,000 mg by mouth daily as needed for mild pain.      . carvedilol (COREG) 25 MG tablet Take 50 mg by mouth 2 (two) times daily with a meal.      . Darunavir Ethanolate (PREZISTA) 800 MG tablet Take 800 mg by mouth daily with breakfast.      . doxazosin (CARDURA) 4 MG tablet Take 4 mg by mouth at bedtime.      . febuxostat (ULORIC) 40 MG tablet Take 1 tablet (40 mg total) by mouth daily.  30 tablet  6  . fluticasone (FLONASE) 50 MCG/ACT nasal spray Place 1-2 sprays into both nostrils daily.      . furosemide (LASIX) 40 MG tablet Take 40 mg by mouth 2 (two) times daily.      Marland Kitchen guaiFENesin-codeine 100-10 MG/5ML syrup Take 5 mLs by mouth 3 (three) times daily as needed for cough.  120 mL  0  . HYDROcodone-acetaminophen (NORCO) 10-325 MG per tablet Take 1 tablet by mouth every 8 (eight) hours as needed.  90 tablet  0  . predniSONE (STERAPRED UNI-PAK) 10 MG tablet Please take 50mg  (5 tabs) for 5 days, 40mg  (4 tabs) for 3 days, 30mg  (3 tabs) for 3 days, 20mg  (2 tabs) for 3 days, 10mg  (1 tab) for 3 days  55 tablet  0  . raltegravir (ISENTRESS) 400 MG tablet Take 400 mg by mouth 2 (two) times daily.       No current facility-administered medications on file prior to visit.   Active Ambulatory Problems    Diagnosis Date Noted  . HIV DISEASE 05/08/2006  . HYPERTRIGLYCERIDEMIA 11/01/2009  . Gout, unspecified 08/13/2009  . 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 ERECTILE DYSFUNCTION 06/08/2008  .  PERIPHERAL NEUROPATHY 05/08/2006  . HYPERTENSION 05/08/2006  . SEIZURE DISORDER 05/08/2006  . ALKALINE PHOSPHATASE, ELEVATED 11/01/2009  . CKD (chronic kidney disease) stage 4, GFR 15-29 ml/min 09/17/2012  . Chronic pain disorder 04/28/2013   Resolved Ambulatory Problems    Diagnosis Date Noted  . PULMONARY TUBERCULOSIS 05/08/2006  . CONDYLOMA ACUMINATA, PENIS 08/06/2006  . SYPHILIS NOS 05/08/2006  . ONYCHOMYCOSIS, TOENAILS 06/08/2008  . KIDNEY DISEASE, CHRONIC, STAGE II 09/27/2006  . ARTHRITIS, RIGHT SHOULDER 05/08/2006  . KNEE PAIN, BILATERAL 10/06/2008  . Dysuria 10/06/2008  . FRACTURE, RIBS, MULTIPLE 01/25/2009  . Sinusitis 03/04/2011  . Allergic rhinitis 03/04/2011  . Tinea capitis 03/04/2011  . Tachycardia 09/22/2011  . Boil, groin 02/03/2012  . Sinusitis 05/13/2012  . Leg swelling 08/27/2012  . Acute respiratory failure 06/11/2013  . Hypertensive emergency 06/11/2013  . Acute pulmonary edema 06/11/2013  . Accelerated hypertension 07/06/2013   Past Medical History  Diagnosis Date  . Hyperlipidemia   . Hypertension   . Seizures   . Syphilis 1997  . Chronic kidney disease   . Sexually transmitted disease   . Rib fractures 01/2009  . HIV infection 1980's  . Male circumcision 11/2005    Review of Systems Left knee pain  from gouty flare. Otherwise negative    Objective:   Physical Exam BP 165/94  Pulse 92  Temp(Src) 98 F (36.7 C) (Oral)  Ht 5\' 10"  (1.778 m)  Wt 245 lb 8 oz (111.358 kg)  BMI 35.23 kg/m2 Physical Exam  Constitutional: He is oriented to person, place, and time. He appears well-developed and well-nourished. No distress.  HENT:  Mouth/Throat: Oropharynx is clear and moist. No oropharyngeal exudate.  Cardiovascular: Normal rate, regular rhythm and normal heart sounds. Exam reveals no gallop and no friction rub.  No murmur heard.  Pulmonary/Chest: Effort normal and breath sounds normal. No respiratory distress. He has no wheezes.  Abdominal:  Soft. Bowel sounds are normal. He exhibits no distension. There is no tenderness.  Lymphadenopathy:  He has no cervical adenopathy.  Neurological: He is alert and oriented to person, place, and time.  Skin: Skin is warm and dry. No rash noted. No erythema.  Psychiatric: He has a normal mood and affect. His behavior is normal.        Assessment & Plan:  hiv = continue with current regimen. Doing well. Reviewed adherence.  Tooth pain = would like referral for dentistry  CKD = HIV meds are renally dosed. He is followed by Dr. Moshe Cipro but also being referrred to [facility] for kidney transplant evaluation  HTN = ok for now on current regimen. Nephrologist/PCP managing  Gout = improving on steroid taper  rtc in 3 months

## 2013-08-14 NOTE — Progress Notes (Signed)
Case discussed with Dr. Cater at time of visit.  We reviewed the resident's history and exam and pertinent patient test results.  I agree with the assessment, diagnosis, and plan of care documented in the resident's note. 

## 2013-08-16 ENCOUNTER — Other Ambulatory Visit (HOSPITAL_COMMUNITY): Payer: Self-pay | Admitting: Internal Medicine

## 2013-09-03 ENCOUNTER — Other Ambulatory Visit (HOSPITAL_COMMUNITY): Payer: Self-pay | Admitting: Internal Medicine

## 2013-09-07 ENCOUNTER — Other Ambulatory Visit (HOSPITAL_COMMUNITY): Payer: Self-pay | Admitting: Internal Medicine

## 2013-09-07 ENCOUNTER — Ambulatory Visit (INDEPENDENT_AMBULATORY_CARE_PROVIDER_SITE_OTHER): Payer: Medicare Other | Admitting: Internal Medicine

## 2013-09-07 ENCOUNTER — Encounter: Payer: Self-pay | Admitting: Internal Medicine

## 2013-09-07 VITALS — BP 165/96 | HR 91 | Temp 98.5°F | Ht 69.0 in | Wt 244.8 lb

## 2013-09-07 DIAGNOSIS — N184 Chronic kidney disease, stage 4 (severe): Secondary | ICD-10-CM

## 2013-09-07 DIAGNOSIS — F17200 Nicotine dependence, unspecified, uncomplicated: Secondary | ICD-10-CM | POA: Diagnosis not present

## 2013-09-07 DIAGNOSIS — I10 Essential (primary) hypertension: Secondary | ICD-10-CM | POA: Diagnosis not present

## 2013-09-07 DIAGNOSIS — K089 Disorder of teeth and supporting structures, unspecified: Secondary | ICD-10-CM | POA: Diagnosis not present

## 2013-09-07 DIAGNOSIS — B20 Human immunodeficiency virus [HIV] disease: Secondary | ICD-10-CM | POA: Diagnosis not present

## 2013-09-07 DIAGNOSIS — G894 Chronic pain syndrome: Secondary | ICD-10-CM | POA: Diagnosis not present

## 2013-09-07 DIAGNOSIS — Z720 Tobacco use: Secondary | ICD-10-CM | POA: Insufficient documentation

## 2013-09-07 DIAGNOSIS — M109 Gout, unspecified: Secondary | ICD-10-CM

## 2013-09-07 DIAGNOSIS — K0889 Other specified disorders of teeth and supporting structures: Secondary | ICD-10-CM

## 2013-09-07 DIAGNOSIS — G8929 Other chronic pain: Secondary | ICD-10-CM

## 2013-09-07 LAB — URIC ACID: Uric Acid, Serum: 9.5 mg/dL — ABNORMAL HIGH (ref 4.0–7.8)

## 2013-09-07 MED ORDER — NICOTINE 14 MG/24HR TD PT24: 14.0000 mg | MEDICATED_PATCH | TRANSDERMAL | Status: DC

## 2013-09-07 MED ORDER — DOXAZOSIN MESYLATE 4 MG PO TABS: 4.0000 mg | ORAL_TABLET | Freq: Every day | ORAL | Status: DC

## 2013-09-07 MED ORDER — HYDROCODONE-ACETAMINOPHEN 10-325 MG PO TABS: 1.0000 | ORAL_TABLET | Freq: Three times a day (TID) | ORAL | Status: DC | PRN

## 2013-09-07 MED ORDER — DOXAZOSIN MESYLATE 4 MG PO TABS: 6.0000 mg | ORAL_TABLET | Freq: Every day | ORAL | Status: DC

## 2013-09-07 NOTE — Assessment & Plan Note (Signed)
  Assessment: Progress toward smoking cessation:  smoking less Comments: Patient smokes 2 cigarettes per day  Plan: Instruction/counseling given:  I counseled patient on the dangers of tobacco use, advised patient to stop smoking, and reviewed strategies to maximize success. Medications to assist with smoking cessation:  Nicotine Patch Patient agreed to the following self-care plans for smoking cessation: set a quit date and stop smoking

## 2013-09-07 NOTE — Progress Notes (Signed)
Patient ID: Darrell Johnson, male   DOB: [birth year], 48 y.o.   MRN: [id number]    Subjective:   Patient ID: Darrell Johnson male   DOB: 1966/02/05 48 y.o.   MRN: [id number]  HPI: [name] is a 48 y.o. man with PMH of gout (bilateral knees), HTN, HLD, CKD, history of syphilis in 1997, HIV on HAART (CD4 560, VL<20 in 06/2013), who presents to the clinic for routine follow up.  Gout - Last visit he was experiencing an acute gout flare of his left knee. Patient was started on a prednsone taper and told to restart his Uloric on day 7 of this taper. Patient said these therapies helped, and he is no longer having pain in his left knee. We need to check a uric acid level today.  CKD - Last Cr 4.98 on 08/03/13. He still has not followed up with Dr. Donnetta Hutching of [facility]. Dr. Moshe Cipro (his nephrologist) thinks he may be a candidate for renal transplant if he can demonstrate excellent medication compliance and his HIV stays under good control. He tells me he is going to see a kidney doctor at [facility] on Friday about transplantation.  HTN - BP today 165/96. Home meds include amlodipine 10 mg, Coreg 50mg  BID, Doxazosin 4mg  qHS, Lasix 40mg  BID with an extra 40 mg in the morning if we gains weight or his legs appear swollen. Weight is stable from 1/14. Denies lower extremity or abdominal swelling. He says he has been out of his meds "for a day or so", but he thinks all the refill requests should have been placed by his pharmacy.  Chronic pain - Patient is on a pain contract with [place] for Norco 10-325 q8hr prn osteoarthritis and gout pain in bilateral knees #90. He was prescribed Norco 5-325 #13 and codeine cough syrup in the emergency department in January, which is technically a violation of our contract, but this was in the setting of an acute gout flare and he did follow up with [place] in the clinic a few days later for our evaluation. He is also due for refills.   Toothache - He has some pain in his left upper  tooth. He saw a dentist last month and had x-rays. He is waiting to hear about whether the tooth needs to be extracted.  Tobacco abuse - Down to 2 cigarettes per day. Needs a refill of Nico-Derm patches.   HIV - Follows with [name], saw her 1/15, good compliance on HAART. HIV meds are renally dosed.   Past Medical History  Diagnosis Date  . 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 Hypertension   . Seizures     last seizure was >5 years ago, pt has family history of seizures  . Syphilis 1997    history of syphilis 1997  . Chronic kidney disease     stage 3-4 CKD, followed by Dr. Moshe Cipro  . 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 Rib fractures 01/2009  . HIV infection 1980's    on ART therapy since, followed by ID clinic, complicated  by neuropathy  . 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 acetaminophen (TYLENOL) 500 MG tablet Take 1,000 mg by mouth daily as needed for mild pain.      Marland Kitchen  carvedilol (COREG) 25 MG tablet TAKE 2 TABLETS (50 MG TOTAL) BY MOUTH 2 (TWO) TIMES DAILY WITH A MEAL.  120 tablet  6  . Darunavir Ethanolate (PREZISTA) 800 MG tablet Take 800 mg by mouth daily with breakfast.      . doxazosin (CARDURA) 4 MG tablet Take 1 tablet (4 mg total) by mouth at bedtime.  30 tablet  11  . febuxostat (ULORIC) 40 MG tablet Take 1 tablet (40 mg total) by mouth daily.  30 tablet  6  . fluticasone (FLONASE) 50 MCG/ACT nasal spray Place 1-2 sprays into both nostrils daily.      . furosemide (LASIX) 40 MG tablet Take 40 mg by mouth 2 (two) times daily.      Marland Kitchen HYDROcodone-acetaminophen (NORCO) 10-325 MG per tablet Take 1 tablet by mouth every 8 (eight) hours as needed.  90 tablet  0  . HYDROcodone-acetaminophen (NORCO) 10-325 MG per  tablet Take 1 tablet by mouth every 8 (eight) hours as needed.  90 tablet  0  . lamivudine (EPIVIR) 100 MG tablet Take 1 tablet (100 mg total) by mouth daily.  30 tablet  5  . raltegravir (ISENTRESS) 400 MG tablet Take 400 mg by mouth 2 (two) times daily.      . ritonavir (NORVIR) 100 MG TABS tablet Take 1 tablet (100 mg total) by mouth daily.  30 tablet  11  . zidovudine (RETROVIR) 300 MG tablet Take 1 tablet (300 mg total) by mouth 2 (two) times daily.  60 tablet  5  . guaiFENesin-codeine 100-10 MG/5ML syrup Take 5 mLs by mouth 3 (three) times daily as needed for cough.  120 mL  0  . nicotine (NICODERM CQ - DOSED IN MG/24 HOURS) 14 mg/24hr patch Place 1 patch (14 mg total) onto the skin daily.  30 patch  11   No current facility-administered medications for this visit.   Family History  Problem Relation Age of Onset  . Cancer Mother   . COPD Father   . Diabetes Sister   . Hypertension Sister   . Diabetes Brother   . Hypertension Brother   . Stroke Neg Hx    History   Social History  . Marital Status: Married    Spouse Name: N/A    Number of Children: N/A  . Years of Education: N/A   Social History Main Topics  . Smoking status: Light Tobacco Smoker -- 0.20 packs/day for 20 years    Types: Cigarettes  . Smokeless tobacco: Never Used     Comment: Trying to cut back.  . Alcohol Use: No  . Drug Use: No  . Sexual Activity: None     Comment: given condoms   Other Topics Concern  . None   Social History Narrative  . None     Review of Systems  Constitutional: Negative for fever and chills.  Respiratory: Negative for cough and shortness of breath.   Cardiovascular: Negative for chest pain, orthopnea and leg swelling.  Gastrointestinal: Negative for nausea, vomiting and abdominal pain.  Genitourinary: Negative for dysuria.  Musculoskeletal: Negative for myalgias.  Skin: Negative for rash.  Neurological: Negative for dizziness and headaches.  Psychiatric/Behavioral:  Negative for depression.  Positive for toothache as above.   Objective:   Physical Exam: Filed Vitals:   09/07/13 1422  BP: 165/96  Pulse: 91  Temp: 98.5 F (36.9 C)  TempSrc: Oral  Height: 5\' 9"  (1.753 m)  Weight: 244 lb 12.8 oz (111.041 kg)  SpO2: 100%  Physical Exam  Constitutional: He is oriented to person, place, and time and well-developed, well-nourished, and in no distress.  HENT:  Head: Normocephalic and atraumatic.  Eyes: Conjunctivae and EOM are normal. Pupils are equal, round, and reactive to light.  Neck: Normal range of motion. Neck supple.  Cardiovascular: Normal rate, regular rhythm and normal heart sounds.  Exam reveals no gallop and no friction rub.   No murmur heard. Pulmonary/Chest: Effort normal and breath sounds normal. No respiratory distress. He has no wheezes. He has no rales. He exhibits no tenderness.  Abdominal: Soft. Bowel sounds are normal.  Musculoskeletal: Normal range of motion. He exhibits no edema and no tenderness.  Neurological: He is alert and oriented to person, place, and time. GCS score is 15.  Skin: Skin is warm and dry. He is not diaphoretic.  Psychiatric: Affect normal.    Assessment & Plan:   Please see problem based charting.

## 2013-09-07 NOTE — Patient Instructions (Addendum)
Thanks for your visit. - We are checking a uric acid level today to see if your Uloric is working at the current dose. I will call you if I need to change the dose to better control your gout. - Continue to follow up with your dentist about your tooth pain. - Please follow up with Dr. Donnetta Hutching, your vascular surgeon, Address: [address] [place], [place] [address], [phone] - I have given you Norco prescriptions for February and March. Please do not lose these prescriptions, we cannot replace them. - Please follow up with me again next month for blood pressure recheck.

## 2013-09-07 NOTE — Assessment & Plan Note (Addendum)
Again I counseled the patient that he needs to followup with Dr. Donnetta Hutching for AV fistula placement. I understand that he is hopeful about a kidney transplant, but I told him he needs to have a plan B, as we cannot know if he will get a kidney, or how long it will take. - Patient was again provided the contact information for Dr. Donnetta Hutching and encouraged to reschedule his appointment - Will obtain records from [facility] at his next appointment (he is seeing them on 2/20)

## 2013-09-07 NOTE — Assessment & Plan Note (Addendum)
Flare has resolved. - Continue Uloric 40 mg daily, last refill was 2/2 per [employer], seems to be compliant - Checking uric acid level  ADDENDUM: Uric acid level is 9.5. Will increase Uloric dose to 80mg  daily and repeat study at his next visit, goal being to achieve a serum uric acid level <6 mg/dL. No dosage adjustment in renal impairment per [employer]. Patient has been notified.

## 2013-09-07 NOTE — Assessment & Plan Note (Signed)
Refilled Norco 10-325 #90 x2 months today, fill dates 09/07/13 and 10/05/13. Patient was informed if he loses these prescriptions, we cannot replace them. Today I reviewed again the terms of his contract. Will consider a UDS in the future if I have additional concerns about misuse.

## 2013-09-07 NOTE — Assessment & Plan Note (Addendum)
He has been compliant with his HAART and is virally suppressed. - Continue followup with [name] - Continue current medications

## 2013-09-07 NOTE — Assessment & Plan Note (Signed)
I encouraged the patient to continue following up with his dentist about this issue.

## 2013-09-07 NOTE — Assessment & Plan Note (Addendum)
BP Readings from Last 3 Encounters:  09/07/13 165/96  08/04/13 165/94  08/03/13 144/80    Lab Results  Component Value Date   NA 139 08/03/2013   K 5.1 08/03/2013   CREATININE 4.98* 08/03/2013    Assessment: Blood pressure control: moderately elevated Progress toward BP goal:  deteriorated Comments: I called his [employer]. Patient has been intermittently nonadherent with his medications.  His last doxazosin refill was 12/20, this means he has not been taking this medication for a couple of weeks (it was a 30-day supply). Last Coreg refill was 1/27, and last Lasix refill was 1/27 (appropriate). The pharmacist did note he missed his December refill of the Lasix (and this was when he was hospitalized for HTN emergency).   Plan: Medications:  continue current medications Other plans: Compliance was strongly encouraged. I will consider increasing the dose of doxazosin to 6mg  if his blood pressure is still elevated next visit WITH compliance. Follow up in one month for blood pressure check.

## 2013-09-08 NOTE — Progress Notes (Signed)
Case discussed with Dr. Cater at the time of the visit.  We reviewed the resident's history and exam and pertinent patient test results.  I agree with the assessment, diagnosis, and plan of care documented in the resident's note.      

## 2013-09-09 DIAGNOSIS — I10 Essential (primary) hypertension: Secondary | ICD-10-CM | POA: Diagnosis not present

## 2013-09-09 DIAGNOSIS — Z21 Asymptomatic human immunodeficiency virus [HIV] infection status: Secondary | ICD-10-CM | POA: Diagnosis not present

## 2013-09-09 DIAGNOSIS — M1990 Unspecified osteoarthritis, unspecified site: Secondary | ICD-10-CM | POA: Insufficient documentation

## 2013-09-09 DIAGNOSIS — Z7682 Awaiting organ transplant status: Secondary | ICD-10-CM | POA: Diagnosis not present

## 2013-09-09 DIAGNOSIS — N185 Chronic kidney disease, stage 5: Secondary | ICD-10-CM | POA: Diagnosis not present

## 2013-09-09 MED ORDER — FEBUXOSTAT 40 MG PO TABS: 80.0000 mg | ORAL_TABLET | Freq: Every day | ORAL | Status: DC

## 2013-09-09 NOTE — Addendum Note (Signed)
Addended by: Paulina Muchmore, Wynelle Bourgeois on: 09/09/2013 09:44 AM   Modules accepted: Orders

## 2013-10-03 ENCOUNTER — Other Ambulatory Visit (HOSPITAL_COMMUNITY): Payer: Self-pay | Admitting: Internal Medicine

## 2013-10-05 ENCOUNTER — Encounter: Payer: Self-pay | Admitting: Internal Medicine

## 2013-10-05 ENCOUNTER — Ambulatory Visit (INDEPENDENT_AMBULATORY_CARE_PROVIDER_SITE_OTHER): Payer: Medicare Other | Admitting: Internal Medicine

## 2013-10-05 VITALS — BP 174/104 | HR 83 | Temp 99.4°F | Ht 69.0 in | Wt 245.1 lb

## 2013-10-05 DIAGNOSIS — I129 Hypertensive chronic kidney disease with stage 1 through stage 4 chronic kidney disease, or unspecified chronic kidney disease: Secondary | ICD-10-CM

## 2013-10-05 DIAGNOSIS — Z720 Tobacco use: Secondary | ICD-10-CM

## 2013-10-05 DIAGNOSIS — M109 Gout, unspecified: Secondary | ICD-10-CM | POA: Diagnosis not present

## 2013-10-05 DIAGNOSIS — B20 Human immunodeficiency virus [HIV] disease: Secondary | ICD-10-CM | POA: Diagnosis not present

## 2013-10-05 DIAGNOSIS — N184 Chronic kidney disease, stage 4 (severe): Secondary | ICD-10-CM

## 2013-10-05 DIAGNOSIS — F17200 Nicotine dependence, unspecified, uncomplicated: Secondary | ICD-10-CM

## 2013-10-05 DIAGNOSIS — I10 Essential (primary) hypertension: Secondary | ICD-10-CM

## 2013-10-05 DIAGNOSIS — G8929 Other chronic pain: Secondary | ICD-10-CM

## 2013-10-05 DIAGNOSIS — G894 Chronic pain syndrome: Secondary | ICD-10-CM

## 2013-10-05 LAB — URIC ACID: Uric Acid, Serum: 6.1 mg/dL (ref 4.0–7.8)

## 2013-10-05 MED ORDER — DOXAZOSIN MESYLATE 8 MG PO TABS: 8.0000 mg | ORAL_TABLET | Freq: Every day | ORAL | Status: DC

## 2013-10-05 MED ORDER — DOXAZOSIN MESYLATE 4 MG PO TABS: 4.0000 mg | ORAL_TABLET | Freq: Every day | ORAL | Status: DC

## 2013-10-05 NOTE — Assessment & Plan Note (Signed)
It sounds like [name] thinks he will be a good candidate for renal transplant. The patient tells me he already had family members volunteering to donate a kidney. There have been issues with medication compliance in the past, but he appears motivated to rectify this. He has some reservations about dialysis, apparently his brother is on HD and has had some complications. Nonetheless, I counseled him that he should at least meet with Dr. Donnetta Hutching about AV fistula placement as nothing is guaranteed regarding his renal transplant. - Patient was again, for the third time, provided the contact information for Dr. Donnetta Hutching and encouraged to reschedule his appointment - Will continue to follow his progress through the transplant process at [facility] to followup with nephrology, Dr. Moshe Cipro

## 2013-10-05 NOTE — Assessment & Plan Note (Signed)
He has been compliant with his HAART. - Followup with [name] in April

## 2013-10-05 NOTE — Assessment & Plan Note (Addendum)
No Norco today as he has refills through April, and I would like to see him again in one month for blood pressure check.

## 2013-10-05 NOTE — Progress Notes (Signed)
Subjective:    Patient ID: Darrell Johnson, male    DOB: 10/13/1965, 48 y.o.   MRN: [id number]  HPI  Darrell Johnson is a 48 y.o. man with PMH of gout (bilateral knees), HTN, HLD, CKD, history of syphilis in 1997, HIV on HAART (CD4 560, VL<20 in 06/2013), who presents to the clinic for routine follow up.   Gout - No acute flare. Uric acid level on 2/20 was 9.5, so we increased his Uloric dose to 80mg  daily. Will repeat today.  CKD - Last Cr 4.98 on 08/03/13. He follows with Dr. Moshe Cipro (Nephrology). He had an initial consult at [facility] about transplantation last month. I reviewed some of the notes in care everywhere. It looks like they mostly discussed the impact of his HIV on post transplant course. They said he appears to be a good candidate, I do not see mention of his issues with medication compliance. He says he is going back to get a stress test/cardiac work up next month, and will be meeting with other doctors. He still has not followed up with Dr. Donnetta Hutching of [facility].   HTN - BP today 174/104. Recheck manually in the room is the same. Home meds should be amlodipine 10 mg, Coreg 50mg  BID, Doxazosin 4mg  qHS, Lasix 40mg  BID with an extra 40 mg in the morning if we gains weight or his legs appear swollen. History of noncompliance and initially tells me he has no been taking the Doxazosin, but later (when reviewing the AVS) he tells me he is taking a medicine at bedtime and he thinks it is this one. Weight is stable from 2/18. Denies shortness of breath, headache, lower extremity or abdominal swelling.   Chronic pain - Patient is on a pain contract with [place] for Norco 10-325 q8hr prn osteoarthritis and gout pain in bilateral knees #90. He already has refill for fill date 10/05/13.   Tobacco abuse - He is back up to 5 cigarettes per day. He says the Nico-Derm patches were too expensive.   HIV - Follows with [name], saw her 1/15, good compliance on HAART. HIV meds are renally  dosed.   Current Outpatient Prescriptions on File Prior to Visit  Medication Sig Dispense Refill  . acetaminophen (TYLENOL) 500 MG tablet Take 1,000 mg by mouth daily as needed for mild pain.      . carvedilol (COREG) 25 MG tablet TAKE 2 TABLETS (50 MG TOTAL) BY MOUTH 2 (TWO) TIMES DAILY WITH A MEAL.  120 tablet  6  . Darunavir Ethanolate (PREZISTA) 800 MG tablet Take 800 mg by mouth daily with breakfast.      . doxazosin (CARDURA) 4 MG tablet Take 1 tablet (4 mg total) by mouth at bedtime.  30 tablet  11  . febuxostat (ULORIC) 40 MG tablet Take 2 tablets (80 mg total) by mouth daily.  60 tablet  6  . fluticasone (FLONASE) 50 MCG/ACT nasal spray Place 1-2 sprays into both nostrils daily.      . fluticasone (FLONASE) 50 MCG/ACT nasal spray PLACE 1-2 SPRAYS INTO BOTH NOSTRILS DAILY.  16 g  6  . furosemide (LASIX) 40 MG tablet Take 40 mg by mouth 2 (two) times daily.      Marland Kitchen guaiFENesin-codeine 100-10 MG/5ML syrup Take 5 mLs by mouth 3 (three) times daily as needed for cough.  120 mL  0  . HYDROcodone-acetaminophen (NORCO) 10-325 MG per tablet Take 1 tablet by mouth every 8 (eight) hours as needed.  [place]  tablet  0  . HYDROcodone-acetaminophen (NORCO) 10-325 MG per tablet Take 1 tablet by mouth every 8 (eight) hours as needed.  90 tablet  0  . lamivudine (EPIVIR) 100 MG tablet Take 1 tablet (100 mg total) by mouth daily.  30 tablet  5  . nicotine (NICODERM CQ - DOSED IN MG/24 HOURS) 14 mg/24hr patch Place 1 patch (14 mg total) onto the skin daily.  30 patch  11  . raltegravir (ISENTRESS) 400 MG tablet Take 400 mg by mouth 2 (two) times daily.      . ritonavir (NORVIR) 100 MG TABS tablet Take 1 tablet (100 mg total) by mouth daily.  30 tablet  11  . zidovudine (RETROVIR) 300 MG tablet Take 1 tablet (300 mg total) by mouth 2 (two) times daily.  60 tablet  5    Review of Systems Constitutional: Negative for fever and chills.  Respiratory: Negative for cough and shortness of breath.  Cardiovascular:  Negative for chest pain, orthopnea and leg swelling.  Gastrointestinal: Negative for nausea, vomiting and abdominal pain.  Genitourinary: Negative for dysuria.  Musculoskeletal: Negative for myalgias.  Skin: Negative for rash.  Neurological: Negative for dizziness and headaches.  Psychiatric/Behavioral: Negative for depression.      Objective:   Physical Exam Filed Vitals:   10/05/13 1512  BP: 174/104  Pulse: 83  Temp: 99.4 F (37.4 C)  Constitutional: He is oriented to person, place, and time and well-developed, well-nourished, and in no distress.  HENT:  Head: Normocephalic and atraumatic.  Eyes: Conjunctivae and EOM are normal. Pupils are equal, round, and reactive to light.  Neck: Normal range of motion. Neck supple.  Cardiovascular: Normal rate, regular rhythm and normal heart sounds. Exam reveals no gallop and no friction rub.  No murmur heard.  Pulmonary/Chest: Effort normal and breath sounds normal. No respiratory distress. He has no wheezes. He has no rales. He exhibits no tenderness.  Abdominal: Soft. Bowel sounds are normal.  Musculoskeletal: Normal range of motion. He exhibits no edema and no tenderness.  Neurological: He is alert and oriented to person, place, and time. GCS score is 15.  Skin: Skin is warm and dry. He is not diaphoretic.  Psychiatric: Affect normal.      Assessment & Plan:   Please see problem-based charting.

## 2013-10-05 NOTE — Assessment & Plan Note (Addendum)
  Assessment: Progress toward smoking cessation:  smoking more Barriers to progress toward smoking cessation:  adverse effects of medications;cost of medications Comments: Patient cannot afford nicotine patches per his report, now smoking 5 cigarettes per day (up from 2)  Plan: Instruction/counseling given:  I counseled patient on the dangers of tobacco use, advised patient to stop smoking, and reviewed strategies to maximize success. Educational resources provided:    Patient provided contact information for 1 800 QUITLINE Medications to assist with smoking cessation:  None Patient agreed to the following self-care plans for smoking cessation: cut down the number of cigarettes smoked;set a quit date and stop smoking  Other plans: Patient counseled on the importance of quitting tobacco prior to renal transplantation if this were to occur

## 2013-10-05 NOTE — Assessment & Plan Note (Signed)
BP Readings from Last 3 Encounters:  10/05/13 174/104  09/07/13 165/96  08/04/13 165/94    Lab Results  Component Value Date   NA 139 08/03/2013   K 5.1 08/03/2013   CREATININE 4.98* 08/03/2013    Assessment: Blood pressure control: severely elevated Progress toward BP goal:  deteriorated Comments: Manual recheck in the room was the same BP  Plan: Medications:  Increase doxazosin to 8 mg nightly Other plans: Patient initially did not remember the doxazosin when I was performing medication reconciliation. When I called his pharmacy last visit, he had not picked this up since December and I counseled him to get it and take it. Jeris Penta reviewing the AVS at the end of the current visit, he remembered the medication after all, and reported compliance. Therefore I will increase the dose to 8 mg nightly and have him followup in one month for blood pressure recheck. I also encouraged the patient to use his home blood pressure cuff every day and write down the values, he will bring this to his next appointment. Patient was counseled on the importance of controlling his blood pressure prior to successful renal transplant.

## 2013-10-05 NOTE — Patient Instructions (Addendum)
Thank you for your visit. - Please take doxazosin 8 mg (2 tabs) at bedtime. I have reordered this medicine for you. This will help to lower your blood pressure. - If you're having trouble getting this medication, please call the clinic and let us know. - It is important that we get control of your blood pressure before you undergo a kidney transplant. - I recommend you check your blood pressure with your cuff every day and write down the number. Please bring this log with you to clinic next month. - I am checking your uric acid level today. I will call you if I need to adjust your gout medicine. - I really recommend you followup with Dr. Donnetta Hutching with vascular surgery, [phone]) [phone]. - Please continue to try to quit smoking. 1-800-QUITLINE is a good resource, and they may be able to get you some free nicotine patches. - Please return in one month for blood pressure check. I will refill your pain medication at that time.

## 2013-10-05 NOTE — Assessment & Plan Note (Addendum)
Not in an acute flare. Taking Uloric 80 mg daily. - Checking uric acid level and will adjust dose accordingly, goal is to achieve serum uric acid level less than 6 mg/dL  ADDENDUM: Uric acid 6.1. Continue current Uloric dose.

## 2013-10-06 ENCOUNTER — Other Ambulatory Visit: Payer: Self-pay | Admitting: *Deleted

## 2013-10-06 DIAGNOSIS — M109 Gout, unspecified: Secondary | ICD-10-CM

## 2013-10-06 MED ORDER — HYDROCODONE-ACETAMINOPHEN 10-325 MG PO TABS: 1.0000 | ORAL_TABLET | Freq: Three times a day (TID) | ORAL | Status: DC | PRN

## 2013-10-06 NOTE — Progress Notes (Signed)
Case discussed with Dr. Cater at the time of the visit.  We reviewed the resident's history and exam and pertinent patient test results.  I agree with the assessment, diagnosis and plan of care documented in the resident's note. 

## 2013-10-20 ENCOUNTER — Other Ambulatory Visit: Payer: Self-pay | Admitting: *Deleted

## 2013-10-20 ENCOUNTER — Other Ambulatory Visit: Payer: Medicare Other

## 2013-10-20 DIAGNOSIS — B20 Human immunodeficiency virus [HIV] disease: Secondary | ICD-10-CM

## 2013-10-20 LAB — CBC WITH DIFFERENTIAL/PLATELET
BASOS PCT: 0 % (ref 0–1)
Basophils Absolute: 0 10*3/uL (ref 0.0–0.1)
Eosinophils Absolute: 0.2 10*3/uL (ref 0.0–0.7)
Eosinophils Relative: 4 % (ref 0–5)
HCT: 28.7 % — ABNORMAL LOW (ref 39.0–52.0)
HEMOGLOBIN: 10 g/dL — AB (ref 13.0–17.0)
Lymphocytes Relative: 27 % (ref 12–46)
Lymphs Abs: 1.5 10*3/uL (ref 0.7–4.0)
MCH: 33.8 pg (ref 26.0–34.0)
MCHC: 34.8 g/dL (ref 30.0–36.0)
MCV: 97 fL (ref 78.0–100.0)
MONOS PCT: 5 % (ref 3–12)
Monocytes Absolute: 0.3 10*3/uL (ref 0.1–1.0)
NEUTROS ABS: 3.6 10*3/uL (ref 1.7–7.7)
Neutrophils Relative %: 64 % (ref 43–77)
Platelets: 188 10*3/uL (ref 150–400)
RBC: 2.96 MIL/uL — ABNORMAL LOW (ref 4.22–5.81)
RDW: 15.6 % — ABNORMAL HIGH (ref 11.5–15.5)
WBC: 5.6 10*3/uL (ref 4.0–10.5)

## 2013-10-20 LAB — COMPLETE METABOLIC PANEL WITH GFR
ALT: 8 U/L (ref 0–53)
AST: 12 U/L (ref 0–37)
Albumin: 3.6 g/dL (ref 3.5–5.2)
Alkaline Phosphatase: 93 U/L (ref 39–117)
BILIRUBIN TOTAL: 0.3 mg/dL (ref 0.2–1.2)
BUN: 59 mg/dL — AB (ref 6–23)
CO2: 23 meq/L (ref 19–32)
CREATININE: 5.31 mg/dL — AB (ref 0.50–1.35)
Calcium: 7.5 mg/dL — ABNORMAL LOW (ref 8.4–10.5)
Chloride: 106 mEq/L (ref 96–112)
GFR, Est African American: 14 mL/min — ABNORMAL LOW
GFR, Est Non African American: 12 mL/min — ABNORMAL LOW
Glucose, Bld: 73 mg/dL (ref 70–99)
Potassium: 5.3 mEq/L (ref 3.5–5.3)
Sodium: 139 mEq/L (ref 135–145)
Total Protein: 6.2 g/dL (ref 6.0–8.3)

## 2013-10-21 LAB — T-HELPER CELL (CD4) - (RCID CLINIC ONLY)
CD4 % Helper T Cell: 32 % — ABNORMAL LOW (ref 33–55)
CD4 T Cell Abs: 490 /uL (ref 400–2700)

## 2013-10-23 LAB — HIV-1 RNA QUANT-NO REFLEX-BLD: HIV-1 RNA Quant, Log: <1.3 {Log} (ref <1.30)

## 2013-11-03 ENCOUNTER — Encounter: Payer: Self-pay | Admitting: Internal Medicine

## 2013-11-03 ENCOUNTER — Ambulatory Visit (INDEPENDENT_AMBULATORY_CARE_PROVIDER_SITE_OTHER): Payer: Medicare Other | Admitting: Internal Medicine

## 2013-11-03 VITALS — BP 173/99 | HR 80 | Temp 98.1°F | Wt 241.0 lb

## 2013-11-03 DIAGNOSIS — B20 Human immunodeficiency virus [HIV] disease: Secondary | ICD-10-CM | POA: Diagnosis not present

## 2013-11-03 DIAGNOSIS — I10 Essential (primary) hypertension: Secondary | ICD-10-CM | POA: Diagnosis not present

## 2013-11-03 DIAGNOSIS — J302 Other seasonal allergic rhinitis: Secondary | ICD-10-CM

## 2013-11-03 DIAGNOSIS — N184 Chronic kidney disease, stage 4 (severe): Secondary | ICD-10-CM

## 2013-11-03 DIAGNOSIS — J309 Allergic rhinitis, unspecified: Secondary | ICD-10-CM | POA: Diagnosis not present

## 2013-11-03 MED ORDER — BECLOMETHASONE DIPROP MONOHYD 42 MCG/SPRAY NA SUSP: 1.0000 | Freq: Two times a day (BID) | NASAL | Status: DC

## 2013-11-03 MED ORDER — MONTELUKAST SODIUM 10 MG PO TABS: 10.0000 mg | ORAL_TABLET | Freq: Every day | ORAL | Status: DC

## 2013-11-03 NOTE — Progress Notes (Signed)
Subjective:    Patient ID: Darrell Johnson, male    DOB: 1966-03-22, 48 y.o.   MRN: [id number]  HPI 48yo M with HIV, NTH, CDK stage 4, CD 4 count of 490/VL<20 has been doing well with hiv meds except that the pharmacy did not fill his prescription for 1 wk. He continues on RLG bid/DRVr daily/lamivudine/zidovudine renally dosed. He states that his seasonal allergies has worsened of late. He states that he is being seen at [facility] for transplant work up. He had to convert stress test to chemical one since decreased exercise tolerance  Current Outpatient Prescriptions on File Prior to Visit  Medication Sig Dispense Refill  . acetaminophen (TYLENOL) 500 MG tablet Take 1,000 mg by mouth daily as needed for mild pain.      . carvedilol (COREG) 25 MG tablet TAKE 2 TABLETS (50 MG TOTAL) BY MOUTH 2 (TWO) TIMES DAILY WITH A MEAL.  120 tablet  6  . Darunavir Ethanolate (PREZISTA) 800 MG tablet Take 800 mg by mouth daily with breakfast.      . doxazosin (CARDURA) 8 MG tablet Take 1 tablet (8 mg total) by mouth at bedtime.  30 tablet  3  . febuxostat (ULORIC) 40 MG tablet Take 2 tablets (80 mg total) by mouth daily.  60 tablet  6  . furosemide (LASIX) 40 MG tablet Take 40 mg by mouth 2 (two) times daily.      Marland Kitchen guaiFENesin-codeine 100-10 MG/5ML syrup Take 5 mLs by mouth 3 (three) times daily as needed for cough.  120 mL  0  . HYDROcodone-acetaminophen (NORCO) 10-325 MG per tablet Take 1 tablet by mouth every 8 (eight) hours as needed for moderate pain.  90 tablet  0  . lamivudine (EPIVIR) 100 MG tablet Take 1 tablet (100 mg total) by mouth daily.  30 tablet  5  . nicotine (NICODERM CQ - DOSED IN MG/24 HOURS) 14 mg/24hr patch Place 1 patch (14 mg total) onto the skin daily.  30 patch  11  . raltegravir (ISENTRESS) 400 MG tablet Take 400 mg by mouth 2 (two) times daily.      . ritonavir (NORVIR) 100 MG TABS tablet Take 1 tablet (100 mg total) by mouth daily.  30 tablet  11  . zidovudine (RETROVIR) 300 MG  tablet Take 1 tablet (300 mg total) by mouth 2 (two) times daily.  60 tablet  5   No current facility-administered medications on file prior to visit.   Active Ambulatory Problems    Diagnosis Date Noted  . HIV DISEASE 05/08/2006  . HYPERTRIGLYCERIDEMIA 11/01/2009  . Gout, unspecified 08/13/2009  . 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 ERECTILE DYSFUNCTION 06/08/2008  . PERIPHERAL NEUROPATHY 05/08/2006  . HYPERTENSION 05/08/2006  . SEIZURE DISORDER 05/08/2006  . ALKALINE PHOSPHATASE, ELEVATED 11/01/2009  . CKD (chronic kidney disease) stage 4, GFR 15-29 ml/min 09/17/2012  . Chronic pain disorder 04/28/2013  . Tobacco abuse 09/07/2013  . Toothache 09/07/2013   Resolved Ambulatory Problems    Diagnosis Date Noted  . PULMONARY TUBERCULOSIS 05/08/2006  . CONDYLOMA ACUMINATA, PENIS 08/06/2006  . SYPHILIS NOS 05/08/2006  . ONYCHOMYCOSIS, TOENAILS 06/08/2008  . KIDNEY DISEASE, CHRONIC, STAGE II 09/27/2006  . ARTHRITIS, RIGHT SHOULDER 05/08/2006  . KNEE PAIN, BILATERAL 10/06/2008  . Dysuria 10/06/2008  . FRACTURE, RIBS, MULTIPLE 01/25/2009  . Sinusitis 03/04/2011  . Allergic rhinitis 03/04/2011  . Tinea capitis 03/04/2011  . Tachycardia 09/22/2011  . Boil, groin 02/03/2012  . Sinusitis 05/13/2012  . Leg swelling 08/27/2012  .  Acute respiratory failure 06/11/2013  . Hypertensive emergency 06/11/2013  . Acute pulmonary edema 06/11/2013  . Accelerated hypertension 07/06/2013   Past Medical History  Diagnosis Date  . Hyperlipidemia   . Hypertension   . Seizures   . Syphilis 1997  . Chronic kidney disease   . Sexually transmitted disease   . Rib fractures 01/2009  . HIV infection 1980's  . Male circumcision 11/2005     Review of Systems     Objective:   Physical Exam BP 173/99  Pulse 80  Temp(Src) 98.1 F (36.7 C) (Oral)  Wt 241 lb (109.317 kg) Physical Exam  Constitutional: He is oriented to person, place, and time. He appears well-developed and well-nourished. No  distress.  HENT:  Mouth/Throat: Oropharynx is clear and moist. No oropharyngeal exudate.  Cardiovascular: Normal rate, regular rhythm and normal heart sounds. Exam reveals no gallop and no friction rub.  No murmur heard.  Pulmonary/Chest: Effort normal and breath sounds normal. No respiratory distress. He has no wheezes.  Abdominal: Soft. Bowel sounds are normal. He exhibits no distension. There is no tenderness.  Lymphadenopathy:  He has no cervical adenopathy.  Neurological: He is alert and oriented to person, place, and time.  Skin: Skin is warm and dry. No rash noted. No erythema.  Psychiatric: He has a normal mood and affect. His behavior is normal.        Assessment & Plan:  hiv = will controlled. Continue with current regimen. Remind him to tell us if having any breaks in meds that are due to pharmacy delay. May need to switch places. Will renally dose his medications and reviewed with him by pharmacy  CKD= currently followed by Dr. Moshe Cipro and [facility]. Recommended that he sees [place] once again just to ensure better BP mgmgt  HTN = still moderately elevated today as in last visit. He mentioned that he had taken meds just prior to visit  Seasonal allergies = asked him to stop taking fluticasone due to interaction with ritonavir and switch to beconase but unsure what his OOP cost will be.

## 2013-11-07 ENCOUNTER — Encounter: Payer: Self-pay | Admitting: Internal Medicine

## 2013-11-07 DIAGNOSIS — Z8619 Personal history of other infectious and parasitic diseases: Secondary | ICD-10-CM | POA: Insufficient documentation

## 2013-11-07 DIAGNOSIS — Z91148 Patient's other noncompliance with medication regimen for other reason: Secondary | ICD-10-CM | POA: Insufficient documentation

## 2013-11-07 DIAGNOSIS — Z9114 Patient's other noncompliance with medication regimen: Secondary | ICD-10-CM | POA: Insufficient documentation

## 2013-11-09 ENCOUNTER — Ambulatory Visit: Payer: Medicare Other | Admitting: Internal Medicine

## 2013-11-09 NOTE — Progress Notes (Signed)
Error .    HPI    Review of Systems          Physical Exam

## 2013-11-10 ENCOUNTER — Ambulatory Visit (INDEPENDENT_AMBULATORY_CARE_PROVIDER_SITE_OTHER): Payer: Medicare Other | Admitting: Internal Medicine

## 2013-11-10 ENCOUNTER — Encounter: Payer: Self-pay | Admitting: Internal Medicine

## 2013-11-10 VITALS — BP 157/98 | HR 86 | Temp 99.7°F | Ht 69.0 in | Wt 249.6 lb

## 2013-11-10 DIAGNOSIS — G894 Chronic pain syndrome: Secondary | ICD-10-CM

## 2013-11-10 DIAGNOSIS — G8929 Other chronic pain: Secondary | ICD-10-CM | POA: Diagnosis not present

## 2013-11-10 DIAGNOSIS — N184 Chronic kidney disease, stage 4 (severe): Secondary | ICD-10-CM | POA: Diagnosis not present

## 2013-11-10 DIAGNOSIS — B20 Human immunodeficiency virus [HIV] disease: Secondary | ICD-10-CM | POA: Diagnosis not present

## 2013-11-10 DIAGNOSIS — M109 Gout, unspecified: Secondary | ICD-10-CM | POA: Diagnosis not present

## 2013-11-10 DIAGNOSIS — I129 Hypertensive chronic kidney disease with stage 1 through stage 4 chronic kidney disease, or unspecified chronic kidney disease: Secondary | ICD-10-CM | POA: Diagnosis not present

## 2013-11-10 DIAGNOSIS — I10 Essential (primary) hypertension: Secondary | ICD-10-CM | POA: Diagnosis not present

## 2013-11-10 DIAGNOSIS — Z7682 Awaiting organ transplant status: Secondary | ICD-10-CM

## 2013-11-10 DIAGNOSIS — F17200 Nicotine dependence, unspecified, uncomplicated: Secondary | ICD-10-CM

## 2013-11-10 DIAGNOSIS — Z720 Tobacco use: Secondary | ICD-10-CM

## 2013-11-10 DIAGNOSIS — K089 Disorder of teeth and supporting structures, unspecified: Secondary | ICD-10-CM | POA: Diagnosis not present

## 2013-11-10 MED ORDER — FUROSEMIDE 40 MG PO TABS: ORAL_TABLET | ORAL | Status: DC

## 2013-11-10 MED ORDER — HYDROCODONE-ACETAMINOPHEN 10-325 MG PO TABS: 1.0000 | ORAL_TABLET | Freq: Three times a day (TID) | ORAL | Status: DC | PRN

## 2013-11-10 NOTE — Assessment & Plan Note (Addendum)
Office visit with Dr. Rogers Blocker rescheduled to 11/11/2013, and he will obtain a CT abdomen/pelvis on this day as well.  - Will continue to follow notes through [facility]

## 2013-11-10 NOTE — Assessment & Plan Note (Signed)
BP Readings from Last 3 Encounters:  11/10/13 157/98  11/03/13 173/99  10/05/13 174/104    Lab Results  Component Value Date   NA 139 10/20/2013   K 5.3 10/20/2013   CREATININE 5.31* 10/20/2013    Assessment: Blood pressure control: moderately elevated Progress toward BP goal:  unchanged Comments: Blood pressure still above goal. He endorses medication compliance.  Plan: Medications:  Increase morning Lasix dose to 80 mg, continue other medications as prescribed Other plans: Patient's CKD is making blood pressure control difficult. Will increase his diuretic and have him followup in 2 months for blood pressure check. I have also asked him to bring his pill bottles to his next visit so we can do a thorough medication reconciliation given his history of noncompliance.

## 2013-11-10 NOTE — Assessment & Plan Note (Signed)
Norco 10-325 #90 refilled with fill dates of 11/10/2013 and 12/10/2013. Patient reminded that if he were to lose these prescriptions, we cannot replace them.

## 2013-11-10 NOTE — Assessment & Plan Note (Signed)
  Assessment: Progress toward smoking cessation:  smoking the same amount Barriers to progress toward smoking cessation:   Not motivated to quit Comments: Patient reminded that he needs to stop smoking as he is less likely to get, and maintain a kidney transplant if he is smoking.  Plan: Instruction/counseling given:  I counseled patient on the dangers of tobacco use, advised patient to stop smoking, and reviewed strategies to maximize success. Medications to assist with smoking cessation:  Nicotine Patch - he has not filled the prescription for nicotine patches that I ordered last visit Patient agreed to the following self-care plans for smoking cessation: cut down the number of cigarettes smoked

## 2013-11-10 NOTE — Assessment & Plan Note (Addendum)
Creatinine is stable. He is nonoliguric. I again told him, for the fourth time, to followup with Dr. Donnetta Hutching about AV fistula placement. I advised him not to put all his eggs in one basket. He is very hesitant about dialysis because his brother did not tolerate it very well, but understands the alternative is morbidity or even possibly death if he were to go into acute renal failure. I will continue to encourage him to take the necessary steps towards dialysis for the time being.

## 2013-11-10 NOTE — Patient Instructions (Addendum)
Thank you for your visit. - Your blood pressure is high today. Please increase your morning dose of Lasix to 80 mg. Continue to take 40 mg at night. - Continue all other medicines as prescribed, they're listed below. - Continue to followup with [name] about your renal transplant. - Again, I really encourage you to meet with Dr. Donnetta Hutching about AV fistula placement sooner rather than later. Contact info is below:  Address: [address] [place], [place] [address] Telephone: Tel [phone] Fax [phone]  - I have refilled your Norco for 2 months. Please remember that we cannot replace these prescriptions if they're lost. - Please bring all of your medication bottles to your next followup visit, including over-the-counter remedies and supplements. - Please continue to try to quit smoking. Your chances of successfully getting and keeping a transplant will be higher if you quit smoking. - Please return in 2 months for blood pressure check.

## 2013-11-10 NOTE — Progress Notes (Signed)
Subjective:    Patient ID: Darrell Johnson, male    DOB: [birth year], 48 y.o.   MRN: [id number]  HPI  Darrell Johnson is a 48 y.o. man with PMH of gout (bilateral knees), HTN, HLD, CKD stage 4, history of syphilis in 1997, HIV on HAART (CD4 490, VL <20 in 04/15), who presents to the clinic for routine follow up.  CKD - Last Cr 5.31 in 4/15. He follows with Dr. Moshe Cipro (Nephrology) and has been going to [name] to be worked up for transplant. I reviewed the notes in Care Everywhere. He underwent an exercise stress test on 10/21/2013 that was normal, EF greater than 55%. He was meant to meet with the doctors before this study but did not know to show up to the appointment, because he didn't read the paperwork. Office visit with Dr. Rogers Blocker was rescheduled to 11/11/2013, and he will obtain a CT abdomen/pelvis on this day as well. He still has not followed up with Dr. Donnetta Hutching of [facility]. Denies oliguria.  Creat  Date Value Ref Range Status  10/20/2013 5.31* 0.50 - 1.35 mg/dL Final  08/03/2013 4.98* 0.50 - 1.35 mg/dL Final     Result repeated and verified.  07/18/2013 5.07* 0.50 - 1.35 mg/dL Final     Creatinine, Ser  Date Value Ref Range Status  07/10/2013 5.55* 0.50 - 1.35 mg/dL Final  07/09/2013 5.36* 0.50 - 1.35 mg/dL Final    HTN - Home meds are amlodipine 10 mg, Coreg 50mg  BID, Doxazosin 8mg  qHS, Lasix 40mg  BID with an extra 40 mg in the morning if we gains weight or his legs appear swollen. History of noncompliance but says he is taking everything. Denies shortness of breath, headache, lower extremity or abdominal swelling.   Chronic pain - Patient is on a pain contract with [place] for Norco 10-325 q8hr #90 prn osteoarthritis and gout pain in bilateral knees. He is due for refills.  Tobacco abuse - He is back up to 8 cigarettes per day. He says the Nico-Derm patches were too expensive.   HIV - Follows with [name], saw her this month, good compliance on HAART. HIV meds are renally  dosed.   Current Outpatient Prescriptions on File Prior to Visit  Medication Sig Dispense Refill  . acetaminophen (TYLENOL) 500 MG tablet Take 1,000 mg by mouth daily as needed for mild pain.      . beclomethasone (BECONASE AQ) 42 MCG/SPRAY nasal spray Place 1 spray into both nostrils 2 (two) times daily. Dose is for each nostril.  25 g  12  . carvedilol (COREG) 25 MG tablet TAKE 2 TABLETS (50 MG TOTAL) BY MOUTH 2 (TWO) TIMES DAILY WITH A MEAL.  120 tablet  6  . Darunavir Ethanolate (PREZISTA) 800 MG tablet Take 800 mg by mouth daily with breakfast.      . doxazosin (CARDURA) 8 MG tablet Take 1 tablet (8 mg total) by mouth at bedtime.  30 tablet  3  . febuxostat (ULORIC) 40 MG tablet Take 2 tablets (80 mg total) by mouth daily.  60 tablet  6  . furosemide (LASIX) 40 MG tablet Take 40 mg by mouth 2 (two) times daily.      Marland Kitchen guaiFENesin-codeine 100-10 MG/5ML syrup Take 5 mLs by mouth 3 (three) times daily as needed for cough.  120 mL  0  . HYDROcodone-acetaminophen (NORCO) 10-325 MG per tablet Take 1 tablet by mouth every 8 (eight) hours as needed for moderate pain.  90 tablet  0  . lamivudine (EPIVIR) 100 MG tablet Take 1 tablet (100 mg total) by mouth daily.  30 tablet  5  . montelukast (SINGULAIR) 10 MG tablet Take 1 tablet (10 mg total) by mouth at bedtime.  30 tablet  6  . nicotine (NICODERM CQ - DOSED IN MG/24 HOURS) 14 mg/24hr patch Place 1 patch (14 mg total) onto the skin daily.  30 patch  11  . raltegravir (ISENTRESS) 400 MG tablet Take 400 mg by mouth 2 (two) times daily.      . ritonavir (NORVIR) 100 MG TABS tablet Take 1 tablet (100 mg total) by mouth daily.  30 tablet  11  . zidovudine (RETROVIR) 300 MG tablet Take 1 tablet (300 mg total) by mouth 2 (two) times daily.  60 tablet  5    Review of Systems Constitutional: Negative for fever and chills.  Respiratory: Negative for cough and shortness of breath.  Cardiovascular: Negative for chest pain, orthopnea and leg swelling.   Gastrointestinal: Negative for nausea, vomiting and abdominal pain.  Genitourinary: Negative for dysuria.  Musculoskeletal: Negative for myalgias.  Skin: Negative for rash.  Neurological: Negative for dizziness and headaches.  Psychiatric/Behavioral: Negative for depression.      Objective:   Physical Exam Constitutional: He is oriented to person, place, and time and well-developed, well-nourished, and in no distress.  HENT:  Head: Normocephalic and atraumatic.  Eyes: Conjunctivae and EOM are normal. Pupils are equal, round, and reactive to light.  Neck: Normal range of motion. Neck supple.  Cardiovascular: Normal rate, regular rhythm and normal heart sounds. Exam reveals no gallop and no friction rub.  No murmur heard.  Pulmonary/Chest: Effort normal and breath sounds normal. No respiratory distress. He has no wheezes. He has no rales. He exhibits no tenderness.  Abdominal: Soft. Bowel sounds are normal.  Musculoskeletal: Normal range of motion. He exhibits no edema and no tenderness.  Neurological: He is alert and oriented to person, place, and time. GCS score is 15.  Skin: Skin is warm and dry. He is not diaphoretic.  Psychiatric: Affect normal.   Filed Vitals:   11/10/13 1323  BP: 157/98  Pulse: 86  Temp: 99.7 F (37.6 C)   My manual BP check was 160/100.    Assessment & Plan:   Please see problem based charting.

## 2013-11-11 DIAGNOSIS — N289 Disorder of kidney and ureter, unspecified: Secondary | ICD-10-CM | POA: Diagnosis not present

## 2013-11-11 DIAGNOSIS — Z7682 Awaiting organ transplant status: Secondary | ICD-10-CM | POA: Diagnosis not present

## 2013-11-11 DIAGNOSIS — Z1159 Encounter for screening for other viral diseases: Secondary | ICD-10-CM | POA: Diagnosis not present

## 2013-11-13 NOTE — Progress Notes (Signed)
Case discussed with Dr. Cater at time of visit.  We reviewed the resident's history and exam and pertinent patient test results.  I agree with the assessment, diagnosis, and plan of care documented in the resident's note. 

## 2013-11-22 ENCOUNTER — Other Ambulatory Visit: Payer: Self-pay | Admitting: *Deleted

## 2013-11-30 ENCOUNTER — Encounter (HOSPITAL_COMMUNITY): Payer: Self-pay | Admitting: Pharmacy Technician

## 2013-12-01 ENCOUNTER — Encounter (HOSPITAL_COMMUNITY): Payer: Self-pay | Admitting: *Deleted

## 2013-12-01 MED ORDER — CHLORHEXIDINE GLUCONATE CLOTH 2 % EX PADS: 6.0000 | MEDICATED_PAD | Freq: Once | CUTANEOUS | Status: DC

## 2013-12-01 MED ORDER — SODIUM CHLORIDE 0.9 % IV SOLN
INTRAVENOUS | Status: DC
  Administered 2013-12-02: 10 mL/h via INTRAVENOUS

## 2013-12-01 MED ORDER — DEXTROSE 5 % IV SOLN
1.5000 g | INTRAVENOUS | Status: AC
  Administered 2013-12-02: 1.5 g via INTRAVENOUS
  Filled 2013-12-01: qty 1.5

## 2013-12-01 NOTE — Progress Notes (Signed)
Verified with Zigmund Daniel at Dr. Luther Parody office that pt is having surgery on his right arm. Pt had told me that he thought it was his left arm, but then said he wasn't sure.

## 2013-12-01 NOTE — Progress Notes (Signed)
Pt's PCP is Dr. Lucila Maine, [facility]'s [facility].

## 2013-12-02 ENCOUNTER — Encounter (HOSPITAL_COMMUNITY): Payer: Self-pay | Admitting: *Deleted

## 2013-12-02 ENCOUNTER — Ambulatory Visit (HOSPITAL_COMMUNITY): Payer: Medicare Other | Admitting: Certified Registered Nurse Anesthetist

## 2013-12-02 ENCOUNTER — Encounter (HOSPITAL_COMMUNITY)
Admission: RE | Disposition: A | Discharge status: Home / Self Care | Payer: Self-pay | Source: Ambulatory Visit | Attending: Vascular Surgery

## 2013-12-02 ENCOUNTER — Other Ambulatory Visit: Payer: Self-pay | Admitting: *Deleted

## 2013-12-02 ENCOUNTER — Ambulatory Visit (HOSPITAL_COMMUNITY)
Admission: RE | Admit: 2013-12-02 | Discharge: 2013-12-02 | Disposition: A | Discharge status: Home / Self Care | Payer: Medicare Other | Source: Ambulatory Visit | Attending: Vascular Surgery | Admitting: Vascular Surgery

## 2013-12-02 ENCOUNTER — Encounter (HOSPITAL_COMMUNITY): Payer: Medicare Other | Admitting: Certified Registered Nurse Anesthetist

## 2013-12-02 ENCOUNTER — Ambulatory Visit (HOSPITAL_COMMUNITY): Payer: Medicare Other

## 2013-12-02 DIAGNOSIS — Z21 Asymptomatic human immunodeficiency virus [HIV] infection status: Secondary | ICD-10-CM | POA: Insufficient documentation

## 2013-12-02 DIAGNOSIS — N184 Chronic kidney disease, stage 4 (severe): Secondary | ICD-10-CM | POA: Insufficient documentation

## 2013-12-02 DIAGNOSIS — Z8619 Personal history of other infectious and parasitic diseases: Secondary | ICD-10-CM | POA: Diagnosis not present

## 2013-12-02 DIAGNOSIS — Z7982 Long term (current) use of aspirin: Secondary | ICD-10-CM | POA: Insufficient documentation

## 2013-12-02 DIAGNOSIS — I10 Essential (primary) hypertension: Secondary | ICD-10-CM | POA: Diagnosis not present

## 2013-12-02 DIAGNOSIS — Z79899 Other long term (current) drug therapy: Secondary | ICD-10-CM | POA: Diagnosis not present

## 2013-12-02 DIAGNOSIS — F17200 Nicotine dependence, unspecified, uncomplicated: Secondary | ICD-10-CM | POA: Diagnosis not present

## 2013-12-02 DIAGNOSIS — I129 Hypertensive chronic kidney disease with stage 1 through stage 4 chronic kidney disease, or unspecified chronic kidney disease: Secondary | ICD-10-CM | POA: Diagnosis not present

## 2013-12-02 DIAGNOSIS — N185 Chronic kidney disease, stage 5: Secondary | ICD-10-CM | POA: Diagnosis not present

## 2013-12-02 DIAGNOSIS — G894 Chronic pain syndrome: Secondary | ICD-10-CM

## 2013-12-02 DIAGNOSIS — N186 End stage renal disease: Secondary | ICD-10-CM

## 2013-12-02 HISTORY — DX: Pneumonia, unspecified organism: J18.9

## 2013-12-02 HISTORY — DX: Anemia, unspecified: D64.9

## 2013-12-02 HISTORY — PX: AV FISTULA PLACEMENT: SHX1204

## 2013-12-02 HISTORY — DX: Unspecified osteoarthritis, unspecified site: M19.90

## 2013-12-02 LAB — POCT I-STAT 4, (NA,K, GLUC, HGB,HCT)
Glucose, Bld: 96 mg/dL (ref 70–99)
HCT: 33 % — ABNORMAL LOW (ref 39.0–52.0)
Hemoglobin: 11.2 g/dL — ABNORMAL LOW (ref 13.0–17.0)
POTASSIUM: 5.3 meq/L (ref 3.7–5.3)
Sodium: 141 mEq/L (ref 137–147)

## 2013-12-02 SURGERY — ARTERIOVENOUS (AV) FISTULA CREATION
Anesthesia: Monitor Anesthesia Care | Site: Arm Lower | Laterality: Right

## 2013-12-02 MED ORDER — ONDANSETRON HCL 4 MG/2ML IJ SOLN
INTRAMUSCULAR | Status: AC
  Filled 2013-12-02: qty 2

## 2013-12-02 MED ORDER — HYDROMORPHONE HCL PF 1 MG/ML IJ SOLN: 0.2500 mg | INTRAMUSCULAR | Status: DC | PRN

## 2013-12-02 MED ORDER — MIDAZOLAM HCL 5 MG/5ML IJ SOLN
INTRAMUSCULAR | Status: DC | PRN
  Administered 2013-12-02: 2 mg via INTRAVENOUS

## 2013-12-02 MED ORDER — 0.9 % SODIUM CHLORIDE (POUR BTL) OPTIME
TOPICAL | Status: DC | PRN
  Administered 2013-12-02: 1000 mL

## 2013-12-02 MED ORDER — LIDOCAINE-EPINEPHRINE 0.5 %-1:200000 IJ SOLN
INTRAMUSCULAR | Status: DC | PRN
  Administered 2013-12-02: 50 mL

## 2013-12-02 MED ORDER — PROPOFOL 10 MG/ML IV BOLUS
INTRAVENOUS | Status: AC
  Filled 2013-12-02: qty 20

## 2013-12-02 MED ORDER — LIDOCAINE HCL (CARDIAC) 20 MG/ML IV SOLN
INTRAVENOUS | Status: AC
  Filled 2013-12-02: qty 5

## 2013-12-02 MED ORDER — MIDAZOLAM HCL 2 MG/2ML IJ SOLN
INTRAMUSCULAR | Status: AC
  Filled 2013-12-02: qty 2

## 2013-12-02 MED ORDER — FENTANYL CITRATE 0.05 MG/ML IJ SOLN
INTRAMUSCULAR | Status: AC
  Filled 2013-12-02: qty 5

## 2013-12-02 MED ORDER — SODIUM CHLORIDE 0.9 % IV SOLN
INTRAVENOUS | Status: DC | PRN
  Administered 2013-12-02 (×2): via INTRAVENOUS

## 2013-12-02 MED ORDER — ROCURONIUM BROMIDE 50 MG/5ML IV SOLN
INTRAVENOUS | Status: AC
  Filled 2013-12-02: qty 1

## 2013-12-02 MED ORDER — PROPOFOL INFUSION 10 MG/ML OPTIME
INTRAVENOUS | Status: DC | PRN
  Administered 2013-12-02: 75 ug/kg/min via INTRAVENOUS

## 2013-12-02 MED ORDER — SODIUM CHLORIDE 0.9 % IR SOLN
Status: DC | PRN
  Administered 2013-12-02: 14:00:00

## 2013-12-02 MED ORDER — LIDOCAINE-EPINEPHRINE 0.5 %-1:200000 IJ SOLN
INTRAMUSCULAR | Status: AC
  Filled 2013-12-02: qty 1

## 2013-12-02 MED ORDER — HYDROCODONE-ACETAMINOPHEN 10-325 MG PO TABS: 1.0000 | ORAL_TABLET | Freq: Three times a day (TID) | ORAL | Status: DC | PRN

## 2013-12-02 MED ORDER — FENTANYL CITRATE 0.05 MG/ML IJ SOLN
INTRAMUSCULAR | Status: DC | PRN
  Administered 2013-12-02 (×2): 25 ug via INTRAVENOUS
  Administered 2013-12-02: 100 ug via INTRAVENOUS

## 2013-12-02 SURGICAL SUPPLY — 40 items
ARMBAND PINK RESTRICT EXTREMIT (MISCELLANEOUS) ×3 IMPLANT
BENZOIN TINCTURE PRP APPL 2/3 (GAUZE/BANDAGES/DRESSINGS) ×3 IMPLANT
BLADE 10 SAFETY STRL DISP (BLADE) ×3 IMPLANT
CANISTER SUCTION 2500CC (MISCELLANEOUS) ×3 IMPLANT
CLIP LIGATING EXTRA MED SLVR (CLIP) ×3 IMPLANT
CLIP LIGATING EXTRA SM BLUE (MISCELLANEOUS) ×3 IMPLANT
CLOSURE WOUND 1/2 X4 (GAUZE/BANDAGES/DRESSINGS) ×1
COVER PROBE W GEL 5X96 (DRAPES) ×3 IMPLANT
COVER SURGICAL LIGHT HANDLE (MISCELLANEOUS) ×3 IMPLANT
DECANTER SPIKE VIAL GLASS SM (MISCELLANEOUS) ×3 IMPLANT
ELECT REM PT RETURN 9FT ADLT (ELECTROSURGICAL) ×3
ELECTRODE REM PT RTRN 9FT ADLT (ELECTROSURGICAL) ×1 IMPLANT
GEL ULTRASOUND 20GR AQUASONIC (MISCELLANEOUS) IMPLANT
GLOVE BIO SURGEON STRL SZ 6.5 (GLOVE) ×4 IMPLANT
GLOVE BIO SURGEONS STRL SZ 6.5 (GLOVE) ×2
GLOVE BIOGEL PI IND STRL 6.5 (GLOVE) ×1 IMPLANT
GLOVE BIOGEL PI IND STRL 7.0 (GLOVE) ×2 IMPLANT
GLOVE BIOGEL PI INDICATOR 6.5 (GLOVE) ×2
GLOVE BIOGEL PI INDICATOR 7.0 (GLOVE) ×4
GLOVE ECLIPSE 6.5 STRL STRAW (GLOVE) ×3 IMPLANT
GLOVE SS BIOGEL STRL SZ 7.5 (GLOVE) ×1 IMPLANT
GLOVE SUPERSENSE BIOGEL SZ 7.5 (GLOVE) ×2
GOWN STRL REUS W/ TWL LRG LVL3 (GOWN DISPOSABLE) ×3 IMPLANT
GOWN STRL REUS W/TWL LRG LVL3 (GOWN DISPOSABLE) ×6
KIT BASIN OR (CUSTOM PROCEDURE TRAY) ×3 IMPLANT
KIT ROOM TURNOVER OR (KITS) ×3 IMPLANT
NS IRRIG 1000ML POUR BTL (IV SOLUTION) ×3 IMPLANT
PACK CV ACCESS (CUSTOM PROCEDURE TRAY) ×3 IMPLANT
PAD ARMBOARD 7.5X6 YLW CONV (MISCELLANEOUS) ×6 IMPLANT
SPONGE GAUZE 4X4 12PLY (GAUZE/BANDAGES/DRESSINGS) ×3 IMPLANT
SPONGE GAUZE 4X4 12PLY STER LF (GAUZE/BANDAGES/DRESSINGS) ×3 IMPLANT
STRIP CLOSURE SKIN 1/2X4 (GAUZE/BANDAGES/DRESSINGS) ×2 IMPLANT
SUT PROLENE 6 0 CC (SUTURE) ×3 IMPLANT
SUT VIC AB 3-0 SH 27 (SUTURE) ×2
SUT VIC AB 3-0 SH 27X BRD (SUTURE) ×1 IMPLANT
TAPE CLOTH SURG 4X10 WHT LF (GAUZE/BANDAGES/DRESSINGS) ×3 IMPLANT
TOWEL OR 17X24 6PK STRL BLUE (TOWEL DISPOSABLE) ×3 IMPLANT
TOWEL OR 17X26 10 PK STRL BLUE (TOWEL DISPOSABLE) ×3 IMPLANT
UNDERPAD 30X30 INCONTINENT (UNDERPADS AND DIAPERS) ×3 IMPLANT
WATER STERILE IRR 1000ML POUR (IV SOLUTION) ×3 IMPLANT

## 2013-12-02 NOTE — Progress Notes (Signed)
12/02/13 0915  OBSTRUCTIVE SLEEP APNEA  Have you ever been diagnosed with sleep apnea through a sleep study? No  Do you snore loudly (loud enough to be heard through closed doors)?  1  Do you often feel tired, fatigued, or sleepy during the daytime? 0  Has anyone observed you stop breathing during your sleep? 0  Do you have, or are you being treated for high blood pressure? 1  BMI more than 35 kg/m2? 1  Age over 49 years old? 0  Neck circumference greater than 40 cm/16 inches? 0  Gender: 1  Obstructive Sleep Apnea Score 4  Score 4 or greater  Results sent to PCP

## 2013-12-02 NOTE — Anesthesia Postprocedure Evaluation (Signed)
  Anesthesia Post-op Note  Patient: Darrell Johnson  Procedure(s) Performed: Procedure(s): ARTERIOVENOUS (AV) FISTULA CREATION (Right)  Patient Location: PACU  Anesthesia Type:MAC  Level of Consciousness: awake  Airway and Oxygen Therapy: Patient Spontanous Breathing  Post-op Pain: mild  Post-op Assessment: Post-op Vital signs reviewed  Post-op Vital Signs: Reviewed  Last Vitals:  Filed Vitals:   12/02/13 1600  BP:   Pulse: 71  Temp:   Resp: 14    Complications: No apparent anesthesia complications

## 2013-12-02 NOTE — Op Note (Signed)
    OPERATIVE REPORT  DATE OF SURGERY: 12/02/2013  PATIENT: Darrell Johnson, 48 y.o. male MRN: [id number]  DOB: May 21, 1966  PRE-OPERATIVE DIAGNOSIS: Chronic renal insufficiency  POST-OPERATIVE DIAGNOSIS:  Same  PROCEDURE: Right wrist Cimino radiocephalic fistula  SURGEON:  Curt Jews, M.D.  PHYSICIAN ASSISTANT: Samantha Rhyne PA-C  ANESTHESIA:  Local with sedation  EBL: MO ml  Total I/O In: 500 [I.V.:500] Out: -   BLOOD ADMINISTERED: None  DRAINS: None  SPECIMEN: None  COUNTS CORRECT:  YES  PLAN OF CARE: PACU   PATIENT DISPOSITION:  PACU - hemodynamically stable  PROCEDURE DETAILS: The patient was taken to the operating placed supine position where the area the right arm from a sterile fashion. Using local anesthesia an incision made from the level of the cephalic vein and the radial artery at the wrist. The cephalic vein had been imaged with SonoSite ultrasound and did appear to be adequate for wrist fistula.  The vein was identified and was of very good caliber. Tributary branches were ligated with 304 0 silk ties and divided. The vein was ligated distally with a 2-0 silk suture and was divided. The vein was mobilized to the level of the radial artery. The radial artery was exposed through the same incision was also of excellent caliber. The artery was occluded proximally and distally with Serafin clamp and was opened with an 11 blade and extended longitudinally with Potts scissors. The vein was brought into approximation with the radial artery was cut to appropriate length and was spatulated. The vein was sewn end-to-side to the artery with a running 6-0 Prolene suture. Prior to completion of the closure the usual flushing maneuvers were undertaken. Anastomosis completed and excellent flow was noted through the fistula. The wound irrigated with saline. Incision artery. Wounds were closed with 3-0 Vicryl in the subcutaneous and subcuticular tissue. Benzoin and Steri-Strips  were applied   Curt Jews, M.D. 12/02/2013 2:53 PM

## 2013-12-02 NOTE — Interval H&P Note (Signed)
History and Physical Interval Note:  12/02/2013 1:07 PM  Darrell Johnson  has presented today for surgery, with the diagnosis of Chronic kidney disease, stage V  The various methods of treatment have been discussed with the patient and family. After consideration of risks, benefits and other options for treatment, the patient has consented to  Procedure(s): ARTERIOVENOUS (AV) FISTULA CREATION (Right) as a surgical intervention .  The patient's history has been reviewed, patient examined, no change in status, stable for surgery.  I have reviewed the patient's chart and labs.  Questions were answered to the patient's satisfaction.     Arvilla Meres Endi Lagman

## 2013-12-02 NOTE — Discharge Instructions (Signed)
° ° °  12/02/2013 BAYLIE STRUMPF [id number] 1966/03/25  Surgeon(s): Rosetta Posner, MD  Procedure(s): ARTERIOVENOUS (AV) FISTULA CREATION  x Do not stick graft for 12 weeks

## 2013-12-02 NOTE — H&P (Signed)
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 Donato Heinz A  History of Present Illness: Darrell Johnson is a 48yo man who presents with SOB for about a month with associated orthopnea and weight gain. He also has LE swelling, however, he does not think this is worse. He has CKD stage 4, was supposed to be on lasix but was not taking it. He further has a history of HIV, well controlled. He is being treated for Hypertensive emergency. We have been ask to see him for future permanent dialysis access. Current medical treatment includes Asprin 81 mg daily, pravastatin, and Heparin SQ. For his BP he is on Coreg and lasix.     Current Facility-Administered Medications     Medication  Dose  Route  Frequency  Provider  Last Rate  Last Dose     .  0.9 % sodium chloride infusion  250 mL  Intravenous  PRN  Blain Pais, MD   250 mL at 07/07/13 1900     .  acetaminophen (TYLENOL) tablet 650 mg  650 mg  Oral  Q6H PRN  Ivor Costa, MD   650 mg at 07/09/13 0308     .  amLODipine (NORVASC) tablet 10 mg  10 mg  Oral  Daily  Marrion Coy, MD   10 mg at 07/08/13 0930     .  aspirin EC tablet 81 mg  81 mg  Oral  Daily  Blain Pais, MD   81 mg at 07/08/13 0931     .  carvedilol (COREG) tablet 50 mg  50 mg  Oral  BID WC  Marrion Coy, MD   50 mg at 07/09/13 0826     .  Darunavir  Ethanolate (PREZISTA) tablet 800 mg  800 mg  Oral  Q breakfast  Blain Pais, MD   800 mg at 07/09/13 0825     .  dextromethorphan-guaiFENesin (MUCINEX DM) 30-600 MG per 12 hr tablet 1 tablet  1 tablet  Oral  BID  Rebecca Eaton, MD   1 tablet at 07/08/13 2209     .  doxazosin (CARDURA) tablet 4 mg  4 mg  Oral  QHS  Rebecca Eaton, MD   4 mg at 07/08/13 2311     .  febuxostat (ULORIC) tablet 40 mg  40 mg  Oral  Daily  Blain Pais, MD   40 mg at 07/08/13 0930     .  fluticasone (FLONASE) 50 MCG/ACT nasal spray 1-2 spray  1-2 spray  Each Nare  Daily  Rebecca Eaton, MD   2 spray at 07/08/13 2209     .  furosemide (LASIX) tablet 80 mg  80 mg  Oral  BID  Blain Pais, MD  80 mg at 07/09/13 0830     .  guaiFENesin (MUCINEX) 12 hr tablet 600 mg  600 mg  Oral  BID PRN  Marrion Coy, MD   600 mg at 07/08/13 1608     .  heparin injection 5,000 Units  5,000 Units  Subcutaneous  Q8H  Blain Pais, MD   5,000 Units at 07/09/13 [id number]     .  ipratropium (ATROVENT) nebulizer solution 0.5 mg  0.5 mg  Nebulization  Q6H  Marrion Coy, MD   0.5 mg at 07/09/13 0121     .  lamiVUDine (EPIVIR) 10 MG/ML solution 100 mg  100 mg  Oral  Daily  Sid Falcon, MD   100 mg at 07/08/13 0931     .  levalbuterol (XOPENEX) nebulizer solution 1.25 mg  1.25 mg  Nebulization  Q6H  Marrion Coy, MD   1.25 mg at 07/09/13 0121     .  loratadine (CLARITIN) tablet 10 mg  10 mg  Oral  Daily  Marrion Coy, MD   10 mg at 07/08/13 1502     .  nicotine (NICODERM CQ - dosed in mg/24 hours) patch 21 mg  21 mg  Transdermal  Daily  Blain Pais, MD       .  ondansetron [facility]) tablet 4 mg  4 mg  Oral  Q6H PRN  Blain Pais, MD        Or     .  ondansetron (ZOFRAN) injection 4 mg  4 mg  Intravenous  Q6H PRN  Blain Pais, MD       .  pravastatin (PRAVACHOL) tablet 40 mg  40 mg  Oral  q1800  Sid Falcon, MD   40 mg at 07/08/13 1751     .  raltegravir  (ISENTRESS) tablet 400 mg  400 mg  Oral  BID  Blain Pais, MD   400 mg at 07/08/13 2209     .  ritonavir (NORVIR) tablet 100 mg  100 mg  Oral  Q breakfast  Blain Pais, MD   100 mg at 07/09/13 0825     .  sodium chloride 0.9 % injection 3 mL  3 mL  Intravenous  Q12H  Blain Pais, MD   3 mL at 07/08/13 2210     .  sodium chloride 0.9 % injection 3 mL  3 mL  Intravenous  PRN  Blain Pais, MD       .  zidovudine (RETROVIR) capsule 300 mg  300 mg  Oral  Q12H  Sid Falcon, MD   300 mg at 07/08/13 2209     Pt meds include:  Statin :Yes  Betablocker: No  ASA: Yes  Other anticoagulants/antiplatelets: Heparin     Past Medical History     Diagnosis  Date     .  Hyperlipidemia        hypertrygliceridemia determined ti be secondary to ART therpay     .  Hypertension      .  Seizures        last seizure was >5 years ago, pt has family history of seizures     .  Syphilis  1997       history of syphilis 1997     .  Chronic kidney disease        stage 3-4 CKD, followed by Dr. Moshe Cipro     .  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  Rib fractures  01/2009     .  HIV infection  1980's       on ART therapy since, followed by ID clinic, complicated by neuropathy     .  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 No pertinent past surgical history.  Social History     History     Substance Use Topics     .  Smoking status:  Current Every Day Smoker -- 0.30 packs/day for 20 years       Types:  Cigarettes     .  Smokeless tobacco:  Never Used        Comment: Trying to cut back.     .  Alcohol Use:  No     Family History     Family History     Problem  Relation  Age of Onset     .  Cancer  Mother      .  COPD  Father      .  Diabetes  Sister      .  Hypertension  Sister      .  Diabetes  Brother      .   Hypertension  Brother      .  Stroke  Neg Hx      No Known Allergies  REVIEW OF SYSTEMS  General: [ ]  Weight loss, [ ]  Fever, [ ]  chills [x]  weight gain  Neurologic: [ ]  Dizziness, [ ]  Blackouts, [ ]  Seizure  [ ]  Stroke, [ ]  "Mini stroke", [ ]  Slurred speech, [ ]  Temporary blindness; [ ]  weakness in arms or legs, [ ]  Hoarseness [ ]  Dysphagia  Cardiac: [ ]  Chest pain/pressure, [x ] Shortness of breath at rest [x ] Shortness of breath with exertion, [ ]  Atrial fibrillation or irregular heartbeat  Vascular: [ ]  Pain in legs with walking, [ ]  Pain in legs at rest, [ ]  Pain in legs at night,  [ ]  Non-healing ulcer, [ ]  Blood clot in vein/DVT,  Pulmonary: [ ]  Home oxygen, [x ] Productive cough, [ ]  Coughing up blood, [ ]  Asthma,  [ ]  Wheezing [ ]  COPD  Musculoskeletal: [ ]  Arthritis, [ ]  Low back pain, [ ]  Joint pain  Hematologic: [ ]  Easy Bruising, [ ]  Anemia; [ ]  Hepatitis  Gastrointestinal: [ ]  Blood in stool, [ ]  Gastroesophageal Reflux/heartburn,  Urinary: [x ] chronic Kidney disease, [ ]  on HD - [ ]  MWF or [ ]  TTHS, [ ]  Burning with urination, [ ]  Difficulty urinating  Skin: [ ]  Rashes, [ ]  Wounds  Psychological: [ ]  Anxiety, [ ]  Depression  Physical Examination     Filed Vitals:      07/09/13 0000  07/09/13 0121  07/09/13 0500  07/09/13 0800     BP:  159/85        Pulse:  94  99       Temp:     98.4 F (36.9 C)     TempSrc:     Oral     Resp:   16       Height:         Weight:    242 lb 1 oz (109.8 kg)      SpO2:  98%  99%  Body mass index is 34.73 kg/(m^2).  General: WDWN in NAD  Gait: Normal  HENT: WNL  Eyes: Pupils equal  Pulmonary: normal non-labored breathing crackles on inspiration  Cardiac: RRR  Abdomen: soft, NT  Skin: no rashes, ulcers noted; no Gangrene , no cellulitis; no open wounds;  Vascular Exam/Pulses:Palpable pulses radial, brachial, femoral DP/PT  Musculoskeletal: no muscle wasting or atrophy; no edema  Neurologic: A&O X 3; Appropriate Affect ;   SENSATION: normal;  MOTOR FUNCTION: 5/5 Symmetric  Speech is fluent/normal  Significant Diagnostic Studies:  CBC     Lab Results     Component  Value  Date      WBC  10.2  07/06/2013      HGB  9.2*  07/06/2013      HCT  27.0*  07/06/2013      MCV  97.5  07/06/2013      PLT  263  07/06/2013     BMET         Component  Value  Date/Time      NA  139  07/09/2013 0515      K  4.0  07/09/2013 0515      CL  102  07/09/2013 0515      CO2  24  07/09/2013 0515      GLUCOSE  87  07/09/2013 0515      BUN  55*  07/09/2013 0515      CREATININE  5.55*  07/09/2013 0515      CREATININE  4.47*  06/15/2013 1130      CALCIUM  8.9  07/09/2013 0515      GFRNONAA  11*  07/09/2013 0515      GFRAA  13*  07/09/2013 0515     Estimated Creatinine Clearance: 20.2 ml/min (by C-G formula based on Cr of 5.55).  COAG      No results found for this basename: INR, PROTIME      Non-Invasive Vascular Imaging: Vein mapping ordered  ASSESSMENT/PLAN:  AKD on CKD not on HD currently due to Hypertension.  We will order vein mapping and he can follow up as an out patient. He is right hand dominant.  He will be discharged tomorrow. I will send message for follow up to the office.  [facility]  07/09/2013  8:59 AM  I have examined the patient, reviewed and agree with above. Discussed options of a dialysis catheter versus long-term dialysis via AV graft or AV fistula. The patient has normal pulses in both wrists. Does not have very large surface veins by physical exam. Will check vein mapping to make recommendations for access.  EARLY, TODD, MD  07/09/2013  10:21 AM

## 2013-12-02 NOTE — Transfer of Care (Signed)
Immediate Anesthesia Transfer of Care Note  Patient: Darrell Johnson  Procedure(s) Performed: Procedure(s): ARTERIOVENOUS (AV) FISTULA CREATION (Right)  Patient Location: PACU  Anesthesia Type:MAC  Level of Consciousness: awake and alert   Airway & Oxygen Therapy: Patient Spontanous Breathing and Patient connected to face mask oxygen  Post-op Assessment: Report given to PACU RN and Post -op Vital signs reviewed and stable  Post vital signs: Reviewed and stable  Complications: No apparent anesthesia complications

## 2013-12-02 NOTE — Anesthesia Preprocedure Evaluation (Signed)
Anesthesia Evaluation  Patient identified by MRN, date of birth, ID band Patient awake    Reviewed: Allergy & Precautions, H&P , NPO status , Patient's Chart, lab work & pertinent test results  Airway Mallampati: II      Dental   Pulmonary Current Smoker,          Cardiovascular hypertension, Rhythm:Regular Rate:Normal     Neuro/Psych Seizures -,     GI/Hepatic Neg liver ROS,   Endo/Other    Renal/GU Renal disease     Musculoskeletal   Abdominal   Peds  Hematology  (+) anemia ,   Anesthesia Other Findings   Reproductive/Obstetrics                           Anesthesia Physical Anesthesia Plan  ASA: III  Anesthesia Plan: MAC   Post-op Pain Management:    Induction: Intravenous  Airway Management Planned: Simple Face Mask  Additional Equipment:   Intra-op Plan:   Post-operative Plan:   Informed Consent: I have reviewed the patients History and Physical, chart, labs and discussed the procedure including the risks, benefits and alternatives for the proposed anesthesia with the patient or authorized representative who has indicated his/her understanding and acceptance.   Dental advisory given  Plan Discussed with: Anesthesiologist and CRNA  Anesthesia Plan Comments:         Anesthesia Quick Evaluation

## 2013-12-05 ENCOUNTER — Telehealth: Payer: Self-pay | Admitting: Vascular Surgery

## 2013-12-05 NOTE — Telephone Encounter (Addendum)
Message copied by Gena Fray on Mon Dec 05, 2013  2:45 PM ------      Message from: Mena Goes      Created: Fri Dec 02, 2013  3:30 PM      Regarding: Schedule                   ----- Message -----         From: Gabriel Earing, PA-C         Sent: 12/02/2013   2:48 PM           To: Vvs Charge Pool            S/p right radio-cephalic AVF [id number].   F/u with Dr. Donnetta Hutching in 4 weeks with duplex.            Thanks,      Samantha ------  12/05/13: spoke with pt, dpm

## 2013-12-06 ENCOUNTER — Encounter (HOSPITAL_COMMUNITY): Payer: Self-pay | Admitting: Vascular Surgery

## 2013-12-18 ENCOUNTER — Emergency Department (HOSPITAL_COMMUNITY): Payer: Medicare Other

## 2013-12-18 ENCOUNTER — Encounter (HOSPITAL_COMMUNITY): Payer: Self-pay | Admitting: Emergency Medicine

## 2013-12-18 ENCOUNTER — Emergency Department (HOSPITAL_COMMUNITY)
Admission: EM | Admit: 2013-12-18 | Discharge: 2013-12-18 | Disposition: A | Discharge status: Home / Self Care | Payer: Medicare Other | Attending: Internal Medicine | Admitting: Internal Medicine

## 2013-12-18 DIAGNOSIS — R569 Unspecified convulsions: Secondary | ICD-10-CM | POA: Diagnosis not present

## 2013-12-18 DIAGNOSIS — M7150 Other bursitis, not elsewhere classified, unspecified site: Secondary | ICD-10-CM | POA: Insufficient documentation

## 2013-12-18 DIAGNOSIS — Z21 Asymptomatic human immunodeficiency virus [HIV] infection status: Secondary | ICD-10-CM | POA: Diagnosis not present

## 2013-12-18 DIAGNOSIS — I129 Hypertensive chronic kidney disease with stage 1 through stage 4 chronic kidney disease, or unspecified chronic kidney disease: Secondary | ICD-10-CM | POA: Insufficient documentation

## 2013-12-18 DIAGNOSIS — I10 Essential (primary) hypertension: Secondary | ICD-10-CM | POA: Diagnosis present

## 2013-12-18 DIAGNOSIS — Z862 Personal history of diseases of the blood and blood-forming organs and certain disorders involving the immune mechanism: Secondary | ICD-10-CM | POA: Insufficient documentation

## 2013-12-18 DIAGNOSIS — M7989 Other specified soft tissue disorders: Secondary | ICD-10-CM | POA: Diagnosis not present

## 2013-12-18 DIAGNOSIS — M719 Bursopathy, unspecified: Secondary | ICD-10-CM

## 2013-12-18 DIAGNOSIS — IMO0002 Reserved for concepts with insufficient information to code with codable children: Secondary | ICD-10-CM | POA: Insufficient documentation

## 2013-12-18 DIAGNOSIS — Z8781 Personal history of (healed) traumatic fracture: Secondary | ICD-10-CM | POA: Insufficient documentation

## 2013-12-18 DIAGNOSIS — M7020 Olecranon bursitis, unspecified elbow: Secondary | ICD-10-CM | POA: Diagnosis not present

## 2013-12-18 DIAGNOSIS — E785 Hyperlipidemia, unspecified: Secondary | ICD-10-CM | POA: Diagnosis not present

## 2013-12-18 DIAGNOSIS — N184 Chronic kidney disease, stage 4 (severe): Secondary | ICD-10-CM | POA: Diagnosis not present

## 2013-12-18 DIAGNOSIS — Z8739 Personal history of other diseases of the musculoskeletal system and connective tissue: Secondary | ICD-10-CM | POA: Insufficient documentation

## 2013-12-18 DIAGNOSIS — Z8701 Personal history of pneumonia (recurrent): Secondary | ICD-10-CM | POA: Insufficient documentation

## 2013-12-18 DIAGNOSIS — B20 Human immunodeficiency virus [HIV] disease: Secondary | ICD-10-CM | POA: Diagnosis present

## 2013-12-18 DIAGNOSIS — E781 Pure hyperglyceridemia: Secondary | ICD-10-CM | POA: Diagnosis present

## 2013-12-18 DIAGNOSIS — F17200 Nicotine dependence, unspecified, uncomplicated: Secondary | ICD-10-CM | POA: Diagnosis not present

## 2013-12-18 DIAGNOSIS — L03113 Cellulitis of right upper limb: Secondary | ICD-10-CM | POA: Diagnosis present

## 2013-12-18 DIAGNOSIS — M19029 Primary osteoarthritis, unspecified elbow: Secondary | ICD-10-CM | POA: Diagnosis not present

## 2013-12-18 LAB — I-STAT CHEM 8, ED
BUN: 72 mg/dL — ABNORMAL HIGH (ref 6–23)
Calcium, Ion: 1.1 mmol/L — ABNORMAL LOW (ref 1.12–1.23)
Chloride: 106 mEq/L (ref 96–112)
Creatinine, Ser: 7.6 mg/dL — ABNORMAL HIGH (ref 0.50–1.35)
Glucose, Bld: 79 mg/dL (ref 70–99)
HEMATOCRIT: 31 % — AB (ref 39.0–52.0)
HEMOGLOBIN: 10.5 g/dL — AB (ref 13.0–17.0)
Potassium: 4.7 mEq/L (ref 3.7–5.3)
SODIUM: 143 meq/L (ref 137–147)
TCO2: 20 mmol/L (ref 0–100)

## 2013-12-18 LAB — COMPREHENSIVE METABOLIC PANEL
ALBUMIN: 3.7 g/dL (ref 3.5–5.2)
ALK PHOS: 128 U/L — AB (ref 39–117)
ALT: 12 U/L (ref 0–53)
AST: 15 U/L (ref 0–37)
BUN: 68 mg/dL — ABNORMAL HIGH (ref 6–23)
CHLORIDE: 103 meq/L (ref 96–112)
CO2: 19 mEq/L (ref 19–32)
Calcium: 8.7 mg/dL (ref 8.4–10.5)
Creatinine, Ser: 7.06 mg/dL — ABNORMAL HIGH (ref 0.50–1.35)
GFR calc Af Amer: 10 mL/min — ABNORMAL LOW (ref >90)
GFR calc non Af Amer: 8 mL/min — ABNORMAL LOW (ref >90)
Glucose, Bld: 67 mg/dL — ABNORMAL LOW (ref 70–99)
POTASSIUM: 4.7 meq/L (ref 3.7–5.3)
SODIUM: 139 meq/L (ref 137–147)
TOTAL PROTEIN: 7.5 g/dL (ref 6.0–8.3)

## 2013-12-18 LAB — CBC WITH DIFFERENTIAL/PLATELET
BASOS ABS: 0 10*3/uL (ref 0.0–0.1)
Basophils Relative: 0 % (ref 0–1)
EOS ABS: 0.1 10*3/uL (ref 0.0–0.7)
EOS PCT: 2 % (ref 0–5)
HCT: 28.1 % — ABNORMAL LOW (ref 39.0–52.0)
Hemoglobin: 9.6 g/dL — ABNORMAL LOW (ref 13.0–17.0)
LYMPHS ABS: 1.2 10*3/uL (ref 0.7–4.0)
Lymphocytes Relative: 19 % (ref 12–46)
MCH: 35 pg — AB (ref 26.0–34.0)
MCHC: 34.2 g/dL (ref 30.0–36.0)
MCV: 102.6 fL — ABNORMAL HIGH (ref 78.0–100.0)
Monocytes Absolute: 0.6 10*3/uL (ref 0.1–1.0)
Monocytes Relative: 10 % (ref 3–12)
Neutro Abs: 4.3 10*3/uL (ref 1.7–7.7)
Neutrophils Relative %: 69 % (ref 43–77)
PLATELETS: 151 10*3/uL (ref 150–400)
RBC: 2.74 MIL/uL — ABNORMAL LOW (ref 4.22–5.81)
RDW: 13.8 % (ref 11.5–15.5)
WBC: 6.3 10*3/uL (ref 4.0–10.5)

## 2013-12-18 LAB — URIC ACID: Uric Acid, Serum: 6.8 mg/dL (ref 4.0–7.8)

## 2013-12-18 MED ORDER — OXYCODONE HCL 5 MG PO TABS
5.0000 mg | ORAL_TABLET | Freq: Once | ORAL | Status: AC
  Administered 2013-12-18: 5 mg via ORAL
  Filled 2013-12-18: qty 1

## 2013-12-18 MED ORDER — DEXTROSE 5 % IV SOLN
1.0000 g | Freq: Once | INTRAVENOUS | Status: AC
  Administered 2013-12-18: 1 g via INTRAVENOUS
  Filled 2013-12-18: qty 10

## 2013-12-18 MED ORDER — OXYCODONE HCL 5 MG PO TABS
5.0000 mg | ORAL_TABLET | Freq: Four times a day (QID) | ORAL | Status: DC | PRN
Start: 2013-12-18 — End: 2014-01-13

## 2013-12-18 NOTE — ED Notes (Signed)
Pt c/o pain and swelling to right elbow x's 1 week.  Denies injury.  Hx of gout in knee

## 2013-12-18 NOTE — ED Notes (Signed)
PT took a tylenol and vicodin one HR ago with out relief.

## 2013-12-18 NOTE — ED Notes (Signed)
Patient transported to X-ray 

## 2013-12-18 NOTE — Progress Notes (Signed)
Mr. SEATON OAKLEY is a 48 y.o. male w/ PMHx of HTN, HLD, CKD w/ recent RUE AVF placement, Gout, HIV/AIDS, arthritis, and anemia, presented to the ED w/ complaints of RUE pain and swelling, mostly involving the right elbow joint. The patient claims this has been present for about 1 week, denies any associated fever, chills, nausea, or vomiting. Patient has a h/o gout, takes Uloric and Colchicine, but stopped taking Colchicine a little over a week ago d/t cost. The patient claims he has had previous gout flares involving his feet and knees bilaterally, but never involving the elbow. Patient also w/ recent RUE AVF placement by Dr. Donnetta Hutching on 12/02/13. Seen by Dr. Bridgett Larsson in the ED, felt that the AVF site was not an issue, but may have some mild central stenosis.  Physical exam: Awake, alert oriented. No acute distress. Pulm exam clear to auscultation bilaterally. No wheezes or crackles. Cardio exam RRR. No murmur Abdominal exam benign RUE w/ swelling and tenderness over the elbow. Pain w/ active/passive ROM of the elbow joint, especially w/ pronation/supination. Joint area w/ mild erythema and warmth, very tender to palpation over the elbow joint. Right hand also w/ some mild swelling. Pulses present bilaterally. RUE AVF site appears clean, dry and well healing. Good thrill and bruit.  Trace pitting edema in LE's. Pulses present. No erythema or tenderness in feet, ankles, or knees.   Given recent h/o AVF placement and multiple co-morbidities, it was advised that patient be admitted. Seen by Dr. Eulas Post and myself, felt that elbow joint was most likely gout flare, however, infection also a possibility. Patient w/out leukocytosis. AVF appeared clean, dry, and intact, did not appear inflamed.  -Discussed w/ patient, PATIENT REFUSED [facility]. -Mr. Barga did agree to stay for XR of the elbow, results pending -ED gave Rocephin IV prior to patient leaving.  -Advised Mr. Shaheen to return to the [facility] clinic  in the AM for follow up.  -Given new increase in Cr (discussed below), will withhold NSAID therapy at this time.   Patient also w/ CKD, planning to start HD in the near future, follows w/ Dr. Moshe Cipro. Today, iSTAT Cr of 7.60, increased from 5.31 on 10/20/13.  -Repeat BMP showed Cr of 7.06  Discussed importance of admission w/ patient at length, however he continued to refuse admission. As discussed above, patient received XR of the right elbow, one dose of Rocephin IV, and was advised to return to the [facility] clinic in the AM. He understood. Have messaged [facility] to call patient in AM to schedule appointment.   Signed: Corky Sox, MD 12/18/2013 10:19 PM

## 2013-12-18 NOTE — Progress Notes (Signed)
   Daily Progress Note  Assessment/Planning: POD #16 s/p R RC AVF, R arm swelling c/w possible central venous stenosis, R elbow pain   No evidence of R RC AVF infection  Entire R arm is mildly swollen c/w possible some degree of central venous stenosis.  Keep arm elevated.  If it worsens, fistulogram to evaluate the central venous stenosis might be needed.  Pt has scheduled follow up with Dr. Donnetta Hutching in the office.  R elbow sx are completely unrelated to his access  Subjective   C/o R elbow pain, denies steal sx   Objective Filed Vitals:   12/18/13 1718  BP: 165/95  Pulse: 92  Temp: 98.1 F (36.7 C)  TempSrc: Oral  Resp: 18  SpO2: 99%   No intake or output data in the 24 hours ending 12/18/13 2010  VASC  R RC AVF with bruit and thrill, no TTP over the fistula, entire R arm mildly swollen M/S R elbow appears inflamed, severe TTP elbow with pt unwilling to allow ROM testing of R elbow  Laboratory CBC    Component Value Date/Time   WBC 6.3 12/18/2013 1809   HGB 10.5* 12/18/2013 1818   HCT 31.0* 12/18/2013 1818   PLT 151 12/18/2013 1809    BMET    Component Value Date/Time   NA 143 12/18/2013 1818   K 4.7 12/18/2013 1818   CL 106 12/18/2013 1818   CO2 23 10/20/2013 1149   GLUCOSE 79 12/18/2013 1818   BUN 72* 12/18/2013 1818   CREATININE 7.60* 12/18/2013 1818   CREATININE 5.31* 10/20/2013 1149   CALCIUM 7.5* 10/20/2013 1149   GFRNONAA 12* 10/20/2013 1149   GFRNONAA 11* 07/10/2013 0520   GFRAA 14* 10/20/2013 [place] 13* 07/10/2013 0520    Adele Barthel, MD [facility] [place]: [phone] Pager: [phone]  12/18/2013, 8:10 PM

## 2013-12-18 NOTE — Discharge Instructions (Signed)
Bursitis Bursitis is when the fluid-filled sac (bursa) that covers and protects a joint gets puffy and irritated. The elbow, shoulder, hip, and knee joints are most often affected. HOME CARE  Put ice on the area.  Put ice in a plastic bag.  Place a towel between your skin and the bag.  Leave the ice on for 15-20 minutes, 03-04 times a day.  Put the joint through a full range of motion 4 times a day. Rest the injured joint at other times. When you have less pain, begin slow movements and usual activities.  Only take medicine as told by your doctor.  Follow up with your doctor. Any delay in care could stop the bursitis from healing. This could cause long-term pain. GET HELP RIGHT AWAY IF:   You have more pain with treatment.  You have a temperature by mouth above 102 F (38.9 C), not controlled by medicine.  You have heat and irritation over the fluid-filled sac. MAKE SURE YOU:   Understand these instructions.  Will watch your condition.  Will get help right away if you are not doing well or get worse. Document Released: 12/25/2009 Document Revised: 09/29/2011 Document Reviewed: 12/25/2009 [facility] Patient Information 2014 [place]. He was seen by outpatient clinic physicians, as well as the vascular surgeon.  It is unclear to your definitive diagnosis.  Your x-ray shows a probable bursitis of your elbow, but do to the redness and warmth in your, extremity.  You've been given a dose of antibiotics, and the doctors would like to see, you in the office tomorrow for recheck

## 2013-12-18 NOTE — ED Provider Notes (Signed)
CSN: [id number]     Arrival date & time 12/18/13  1708 History   None    This chart was scribed for non-physician practitioner, Junius Creamer, [place] working with Osvaldo Shipper, MD by Forrestine Him, ED Scribe. This patient was seen in room TR06C/TR06C and the patient's care was started at 5:54 PM.   Chief Complaint  Patient presents with  . Joint Swelling   The history is provided by the patient. No language interpreter was used.    HPI Comments: Darrell Johnson is a 48 y.o. Male with a PMHx of hyperlipidemia, HTN, seizures, syphilis, HIV infection, and chronic kidney disease who presents to the Emergency Department complaining of constant, moderate R elbow joint swelling that is progressively worsening. Pt also reports constant, moderate pain to the joint along with warm and mild redness. He denies any recent injury or trauma. However, pt recently had a graft placed by Dr. Donnetta Hutching in the R arm about 2 week ago. He denies contacting his surgeon in regards to this concern. He has not tried anything OTC or any home remedies for his symptoms. At this time he denies any fever or chills. He has no other concerns this visit.  Past Medical History  Diagnosis Date  . 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 Hypertension   . Seizures     last seizure was >5 years ago, pt has family history of seizures  . Syphilis 1997    history of syphilis 1997  . Chronic kidney disease     stage 3-4 CKD, followed by Dr. Moshe Cipro  . 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 Rib fractures 01/2009  . HIV infection 1980's    on ART therapy since, followed by ID clinic, complicated  by neuropathy  . 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 Pneumonia   . Arthritis   . Anemia    Past Surgical History  Procedure Laterality Date  .  Abscess drainage    . Av fistula placement Right 12/02/2013    Procedure: ARTERIOVENOUS (AV) FISTULA CREATION;  Surgeon: Rosetta Posner, MD;  Location: [facility] OR;  Service: Vascular;  Laterality: Right;   Family History  Problem Relation Age of Onset  . Cancer Mother   . COPD Father   . Diabetes Sister   . Hypertension Sister   . Diabetes Brother   . Hypertension Brother   . Stroke Neg Hx    History  Substance Use Topics  . Smoking status: Current Every Day Smoker -- 0.30 packs/day for 20 years    Types: Cigarettes  . Smokeless tobacco: Never Used     Comment: decreasing.  . Alcohol Use: Yes     Comment: Vodka sometimes.    Review of Systems  Constitutional: Negative for fever and chills.  HENT: Negative for congestion.   Eyes: Negative for redness.  Respiratory: Negative for cough.   Musculoskeletal: Positive for arthralgias (R elbow) and joint swelling (R elbow).  Skin: Negative for rash.  Neurological: Negative for weakness and numbness.  Psychiatric/Behavioral: Negative for confusion.      Allergies  Review of patient's allergies indicates no known allergies.  Home Medications   Prior to Admission medications   Medication Sig Start Date End Date Taking? Authorizing Provider  acetaminophen (TYLENOL) 500 MG tablet Take 1,000  mg by mouth daily as needed for mild pain.    Historical Provider, MD  carvedilol (COREG) 25 MG tablet Take 50 mg by mouth 2 (two) times daily with a meal.    Historical Provider, MD  colchicine 0.6 MG tablet Take 0.6 mg by mouth daily.    Historical Provider, MD  Darunavir Ethanolate (PREZISTA) 800 MG tablet Take 800 mg by mouth daily with breakfast.    Historical Provider, MD  doxazosin (CARDURA) 4 MG tablet Take 4 mg by mouth 2 (two) times daily.     Historical Provider, MD  febuxostat (ULORIC) 40 MG tablet Take 40 mg by mouth daily.    Historical Provider, MD  fluticasone (FLONASE) 50 MCG/ACT nasal spray Place 1 spray into both nostrils daily as  needed for allergies or rhinitis.    Historical Provider, MD  furosemide (LASIX) 40 MG tablet Take 40-80 mg by mouth 2 (two) times daily. Take 80 mg every morning and 40 mg every evening.    Historical Provider, MD  HYDROcodone-acetaminophen (NORCO) 10-325 MG per tablet Take 1 tablet by mouth every 8 (eight) hours as needed for moderate pain. 12/02/13   Samantha J Rhyne, PA-C  lamiVUDine-zidovudine (COMBIVIR) 150-300 MG per tablet Take 1 tablet by mouth 2 (two) times daily.    Historical Provider, MD  montelukast (SINGULAIR) 10 MG tablet Take 10 mg by mouth 2 (two) times daily. 11/03/13   Carlyle Basques, MD  oxyCODONE (OXY IR/ROXICODONE) 5 MG immediate release tablet Take 1 tablet (5 mg total) by mouth every 6 (six) hours as needed for severe pain. 12/18/13   Garald Balding, NP  raltegravir (ISENTRESS) 400 MG tablet Take 400 mg by mouth 2 (two) times daily.    Historical Provider, MD  ritonavir (NORVIR) 100 MG TABS tablet Take 1 tablet (100 mg total) by mouth daily. 08/04/13   Carlyle Basques, MD   Triage Vitals: BP 165/95  Pulse 92  Temp(Src) 98.1 F (36.7 C) (Oral)  Resp 18  SpO2 99%   Physical Exam  Nursing note and vitals reviewed. Constitutional: He is oriented to person, place, and time. He appears well-developed and well-nourished.  HENT:  Head: Normocephalic.  Eyes: EOM are normal.  Neck: Normal range of motion.  Pulmonary/Chest: Effort normal.  Abdominal: He exhibits no distension.  Musculoskeletal: Normal range of motion.  Neurological: He is alert and oriented to person, place, and time.  Psychiatric: He has a normal mood and affect.    ED Course  Procedures (including critical care time)  DIAGNOSTIC STUDIES: Oxygen Saturation is 99% on RA, Normal by my interpretation.    COORDINATION OF CARE: 5:56 PM- Will order CBC with differential and I-stat chem 8. Discussed treatment plan with pt at bedside and pt agreed to plan.     Labs Review Labs Reviewed  CBC WITH DIFFERENTIAL  - Abnormal; Notable for the following:    RBC 2.74 (*)    Hemoglobin 9.6 (*)    HCT 28.1 (*)    MCV 102.6 (*)    MCH 35.0 (*)    All other components within normal limits  I-STAT CHEM 8, ED - Abnormal; Notable for the following:    BUN 72 (*)    Creatinine, Ser 7.60 (*)    Calcium, Ion 1.10 (*)    Hemoglobin 10.5 (*)    HCT 31.0 (*)    All other components within normal limits    Imaging Review Dg Elbow Complete Right  12/18/2013   CLINICAL DATA:  acute pain with edema and erythema at elbow  EXAM: RIGHT ELBOW - COMPLETE 3+ VIEW  COMPARISON:  None.  FINDINGS: There is no evidence of fracture, dislocation, or joint effusion. There is no evidence of arthropathy or other focal bone abnormality. Degenerative spurring present at the olecranon.  Mild soft tissue swelling present at the posterior aspect of the elbow, which may reflect sequelae of trauma, cellulitis, or possibly bursitis. No soft tissue emphysema or radiopaque foreign body.  IMPRESSION: 1. Mild soft tissue swelling at the posterior aspect of the elbow, which may reflect sequelae of trauma, cellulitis, or possibly bursitis. 2. No acute fracture or dislocation. 3. Mild degenerative spurring at the olecranon.   Electronically Signed   By: Jeannine Boga M.D.   On: 12/18/2013 22:40     EKG Interpretation None      MDM  Dr. Geryl Councilman, vascular surgeon examine the patient.  He does not feel like the edema and erythema is as a result of the fistula placement. He was seen by outpatient clinics.  Physicians, who feel is more a site is/cellulitis.  We agreed on an IV dose of Rocephin in the emergency department.  Pain medication, and followup in the office tomorrow. CBC is normal.  Creatinine, is concerning with a drum from 5.6-7.6 in a month's period of time.  They will discuss with nephrology, starting dialysis,in the office Final diagnoses:  Cellulitis of right upper extremity  Bursitis       I personally performed the  services described in this documentation, which was scribed in my presence. The recorded information has been reviewed and is accurate.    Garald Balding, NP 12/18/13 2256

## 2013-12-19 ENCOUNTER — Encounter: Payer: Self-pay | Admitting: Internal Medicine

## 2013-12-19 ENCOUNTER — Ambulatory Visit (INDEPENDENT_AMBULATORY_CARE_PROVIDER_SITE_OTHER): Payer: Medicare Other | Admitting: Internal Medicine

## 2013-12-19 VITALS — BP 144/81 | HR 82 | Temp 98.0°F | Wt 252.2 lb

## 2013-12-19 DIAGNOSIS — M109 Gout, unspecified: Secondary | ICD-10-CM | POA: Diagnosis not present

## 2013-12-19 DIAGNOSIS — F17200 Nicotine dependence, unspecified, uncomplicated: Secondary | ICD-10-CM | POA: Diagnosis not present

## 2013-12-19 DIAGNOSIS — I129 Hypertensive chronic kidney disease with stage 1 through stage 4 chronic kidney disease, or unspecified chronic kidney disease: Secondary | ICD-10-CM | POA: Diagnosis not present

## 2013-12-19 DIAGNOSIS — B20 Human immunodeficiency virus [HIV] disease: Secondary | ICD-10-CM | POA: Diagnosis not present

## 2013-12-19 DIAGNOSIS — K089 Disorder of teeth and supporting structures, unspecified: Secondary | ICD-10-CM | POA: Diagnosis not present

## 2013-12-19 DIAGNOSIS — G8929 Other chronic pain: Secondary | ICD-10-CM | POA: Diagnosis not present

## 2013-12-19 DIAGNOSIS — I10 Essential (primary) hypertension: Secondary | ICD-10-CM | POA: Diagnosis not present

## 2013-12-19 DIAGNOSIS — N184 Chronic kidney disease, stage 4 (severe): Secondary | ICD-10-CM | POA: Diagnosis not present

## 2013-12-19 DIAGNOSIS — G894 Chronic pain syndrome: Secondary | ICD-10-CM | POA: Diagnosis not present

## 2013-12-19 NOTE — Assessment & Plan Note (Signed)
Patient with a known diagnosis of Gout with recurrent Gout attacks in the past, currently on Uloric  In the setting of a recent surgical procedure (Right Radiocephalic AVF on [id number]) Patient symptoms and clinical exam are suggestive of Acute Gout Flare up involving the right elbow. Pain and swelling appears to be resolving per patient. Discussed with the attending regarding further management.  Plans: As the pain and swelling are improving gradually, we will defer the prednisone therapy and continue the pain management with Opiates.  Educated patient that if the pain/swelling persist or worsen, to call the clinic for a consideration to start on Prednisone therapy. Otherwise follow up as needed. Recommended to be compliant with the Uloric and starting the tablet after prolonged non-compliance could also trigger the gout attacks. Patient is agreeable to the plan and expressed willingness to call the clinic for persisting or worsening symptoms.

## 2013-12-19 NOTE — Progress Notes (Signed)
Subjective:   Patient ID: Darrell Johnson male   DOB: [birth year] 48 y.o.   MRN: [id number]  HPI: Darrell Johnson is a 48 y.o. gentleman with PMH significant for HTN, HLD, HIV disease, CKD stage 4 with recent Right AVF placement on 12/02/13 comes to the office with Right elbow pain and swelling of one week duration.  Patient reports that the pain and swelling started about a week ago, progressively worsened for a couple of days and gradually is getting better over the last 2-3 days. Patient states that he has gout for many years and has had several gout attacks of his knee and that his right elbow pain and swelling is similar to his gout attacks. He denies any fever, chills, body pains, nausea, vomiting, abdominal pain. He denies any trauma to the right elbow. He reports that he takes Uloric every day but he misses a dose or two every now and then.   Patient was seen in the ED yesterday for the same complaints and was consulted by vascular surgery who stated that his symptoms are not related to his AVF placement surgery. Patient received a dose of Rocephin for the possibility of cellulitis along with Oxycontin (#12 per patient) and was recommended to follow up with [facility] today for further management.  Patient denies any other complaints.   Past Medical History  Diagnosis Date  . 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 Hypertension   . Seizures     last seizure was >5 years ago, pt has family history of seizures  . Syphilis 1997    history of syphilis 1997  . Chronic kidney disease     stage 3-4 CKD, followed by Dr. Moshe Cipro  . 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 Rib fractures 01/2009  . HIV infection 1980's    on ART therapy since, followed by ID clinic, complicated  by neuropathy  .  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 Pneumonia   . Arthritis   . Anemia    Current Outpatient Prescriptions  Medication Sig Dispense Refill  . acetaminophen (TYLENOL) 500 MG tablet Take 1,000 mg by mouth daily as needed for mild pain.      . carvedilol (COREG) 25 MG tablet Take 50 mg by mouth 2 (two) times daily with a meal.      . colchicine 0.6 MG tablet Take 0.6 mg by mouth daily.      . Darunavir Ethanolate (PREZISTA) 800 MG tablet Take 800 mg by mouth daily with breakfast.      . doxazosin (CARDURA) 4 MG tablet Take 4 mg by mouth 2 (two) times daily.       . febuxostat (ULORIC) 40 MG tablet Take 40 mg by mouth daily.      . fluticasone (FLONASE) 50 MCG/ACT nasal spray Place 1 spray into both nostrils daily as needed for allergies or rhinitis.      . furosemide (LASIX) 40 MG tablet Take 40-80 mg by mouth 2 (two) times daily. Take 80 mg every morning and 40 mg every evening.      Marland Kitchen HYDROcodone-acetaminophen (NORCO) 10-325 MG per tablet Take 1 tablet by mouth every 8 (eight) hours as needed for moderate pain.  30 tablet  0  . lamiVUDine-zidovudine (COMBIVIR) 150-300 MG per tablet Take 1  tablet by mouth 2 (two) times daily.      . montelukast (SINGULAIR) 10 MG tablet Take 10 mg by mouth 2 (two) times daily.      Marland Kitchen oxyCODONE (OXY IR/ROXICODONE) 5 MG immediate release tablet Take 1 tablet (5 mg total) by mouth every 6 (six) hours as needed for severe pain.  12 tablet  0  . raltegravir (ISENTRESS) 400 MG tablet Take 400 mg by mouth 2 (two) times daily.      . ritonavir (NORVIR) 100 MG TABS tablet Take 1 tablet (100 mg total) by mouth daily.  30 tablet  11   No current facility-administered medications for this visit.   Family History  Problem Relation Age of Onset  . Cancer Mother   . COPD Father   . Diabetes Sister   . Hypertension Sister   . Diabetes Brother   . Hypertension Brother   . Stroke Neg Hx    History   Social History  . Marital Status: Married    Spouse Name: N/A    Number  of Children: N/A  . Years of Education: N/A   Social History Main Topics  . Smoking status: Current Every Day Smoker -- 0.30 packs/day for 20 years    Types: Cigarettes  . Smokeless tobacco: Never Used     Comment: decreasing.  . Alcohol Use: Yes     Comment: Vodka sometimes.  . Drug Use: No  . Sexual Activity: None     Comment: given condoms   Other Topics Concern  . None   Social History Narrative  . None   Review of Systems: Pertinent items are noted in HPI. Objective:  Physical Exam: Filed Vitals:   12/19/13 1048 12/19/13 1150  BP: 141/81 144/81  Pulse: 86 82  Temp: 98 F (36.7 C)   TempSrc: Oral   Weight: 252 lb 3.2 oz (114.397 kg)   SpO2: 98%    Constitutional: Vital signs reviewed.  Patient is a well-developed and well-nourished and is in no acute distress and cooperative with exam. Alert and oriented x3.  Head: Normocephalic and atraumatic Cardiovascular: RRR, S1 normal, S2 normal, no MRG, pulses symmetric and intact bilaterally Pulmonary/Chest: normal respiratory effort, CTAB, no wheezes, rales, or rhonchi Right arm: Right radiocephalic AVF scar looks very healthy, healing well without any signs of induration or purulent drainage. Right elbow is moderately swollen posteriorly. The skin over the posterior aspect of the right elbow is warm and tender to touch. There is no sign of fluctuance noted and there are no signs of cellulitis over the posterior aspect of the elbow. The anterior aspect of the right elbow appears normal without any signs of pain, increased warmth or tenderness to palpation. Active flexion and extension are possible but restricted by pain and swelling.  Hematology: no cervical, inginal, or axillary adenopathy.  Neurological: A&O x3 Skin: Warm, dry and intact.  Psychiatric: Normal mood and affect.  Assessment & Plan:

## 2013-12-19 NOTE — Patient Instructions (Signed)
Take the Pain medications as instructed. If the pain and swelling are getting worse, please give [place] a call.

## 2013-12-20 NOTE — ED Provider Notes (Signed)
Medical screening examination/treatment/procedure(s) were conducted as a shared visit with non-physician practitioner(s) and myself.  I personally evaluated the patient during the encounter.   EKG Interpretation None      Patient here with R arm pain. Recent R wrist fistula placed as a "backup plan for dialysis if needed." R arm erythema and edema surrounding elbow joint, mainly superficial though with extension distal on dorsal proximal forearm, distal humerus. No elbow effusion appreciated. Fistula appears well. Vascular surgeon evaluated the patient, feels more c/w cellulitis, we agree. Given antibiotics, will f/u in office tmw. Normal white count.  Osvaldo Shipper, MD 12/20/13 [phone]

## 2013-12-21 NOTE — Progress Notes (Signed)
Naturally improving right elbow pain and swelling history - patient examined personally. Patient reports h/o gout.   I saw and evaluated the patient.  I personally confirmed the key portions of the history and exam documented by Dr. Eyvonne Mechanic and I reviewed pertinent patient test results.  The assessment, diagnosis, and plan were formulated together and I agree with the documentation in the resident's note.

## 2013-12-23 ENCOUNTER — Ambulatory Visit (HOSPITAL_COMMUNITY)
Admission: RE | Admit: 2013-12-23 | Discharge: 2013-12-23 | Disposition: A | Discharge status: Home / Self Care | Payer: Medicare Other | Source: Ambulatory Visit | Attending: Family Medicine | Admitting: Family Medicine

## 2013-12-23 ENCOUNTER — Other Ambulatory Visit (HOSPITAL_COMMUNITY): Payer: Self-pay | Admitting: Family Medicine

## 2013-12-23 DIAGNOSIS — M7989 Other specified soft tissue disorders: Secondary | ICD-10-CM

## 2013-12-23 DIAGNOSIS — M25539 Pain in unspecified wrist: Secondary | ICD-10-CM | POA: Diagnosis not present

## 2013-12-23 DIAGNOSIS — M25569 Pain in unspecified knee: Secondary | ICD-10-CM

## 2013-12-23 DIAGNOSIS — L03113 Cellulitis of right upper limb: Secondary | ICD-10-CM

## 2013-12-23 DIAGNOSIS — M79609 Pain in unspecified limb: Secondary | ICD-10-CM | POA: Diagnosis not present

## 2013-12-23 NOTE — Progress Notes (Signed)
VASCULAR LAB PRELIMINARY  PRELIMINARY  PRELIMINARY  PRELIMINARY  Right upper extremity venous duplex completed.    Preliminary report:  Right:  No evidence of DVT or superficial thrombosis.    Nani Ravens, RVT 12/23/2013, 7:49 PM

## 2013-12-26 ENCOUNTER — Encounter: Payer: Self-pay | Admitting: Vascular Surgery

## 2013-12-26 DIAGNOSIS — M25569 Pain in unspecified knee: Secondary | ICD-10-CM | POA: Diagnosis not present

## 2013-12-27 ENCOUNTER — Ambulatory Visit (INDEPENDENT_AMBULATORY_CARE_PROVIDER_SITE_OTHER): Payer: Self-pay | Admitting: Vascular Surgery

## 2013-12-27 ENCOUNTER — Encounter: Payer: Self-pay | Admitting: Vascular Surgery

## 2013-12-27 VITALS — BP 176/99 | HR 79 | Resp 16 | Ht 71.0 in | Wt 246.0 lb

## 2013-12-27 DIAGNOSIS — Z992 Dependence on renal dialysis: Secondary | ICD-10-CM

## 2013-12-27 DIAGNOSIS — IMO0002 Reserved for concepts with insufficient information to code with codable children: Secondary | ICD-10-CM

## 2013-12-27 DIAGNOSIS — L03113 Cellulitis of right upper limb: Secondary | ICD-10-CM

## 2013-12-27 DIAGNOSIS — N186 End stage renal disease: Secondary | ICD-10-CM

## 2013-12-27 NOTE — Progress Notes (Signed)
   [facility]  HPI:  This is a 48 y/o male who under went right Radiocephalic AV fistula creation on 12/02/2013 by Dr. Donnetta Hutching.  He developed right elbow pain 1-2 weeks post op.  The pain has now developed in the right wrist as well.  He does have a history of gout.  He was seen by a doctor at [facility] and at the [facility].  He had lab work done at [facility].  It was negative for gout, no elevated WBC, and no reports of fevers or chills.  He is here today for a check on the fistula in the right arm.   Objective 176/99 79   16   Moderate edema in the right upper extremity. Tenderness to touch at the olecranon and the carpals of the right extremity. Sensation to light touch is intact and equal Bilaterally, no pain with finger joint movements.  The fistula has a very good palpable thrill, the incision is well healed with out erythema or warmth touch.     Assessment/Planning: 1 month S/P right radiocephalic AV Fistula creation  The painful areas of the right arm are joint areas.  The fistula could potentially cause steal syndrome, but this would cause hand symptoms only including pallor, pain, decreased motion, and non palpable pulses.  We advised him to keep his follow up appointment with His Orthopedic physician. We recommended Ice and elevation.  The patient was seen by Dr. Donnetta Hutching today and examined.  Ulyses Amor 12/27/2013 2:29 PM --    I have examined the patient, reviewed and agree with above. Un usual symptoms. Physical exam standpoint looks like well-healing right Cimino fistula with excellent thrill and excellent maturation. Has had ongoing pain in his right elbow into his hand with tenderness in his hand as well. No evidence of ischemia. We'll keep followup with orthopedics. Do not feel this is related to access issues. Should be available for access in 2 months  Rosetta Posner, MD 12/27/2013 3:00 PM

## 2014-01-03 ENCOUNTER — Encounter: Payer: Medicare Other | Admitting: Vascular Surgery

## 2014-01-03 ENCOUNTER — Other Ambulatory Visit (HOSPITAL_COMMUNITY): Payer: Medicare Other

## 2014-01-04 ENCOUNTER — Encounter: Payer: Medicare Other | Admitting: Internal Medicine

## 2014-01-06 DIAGNOSIS — D631 Anemia in chronic kidney disease: Secondary | ICD-10-CM | POA: Diagnosis not present

## 2014-01-06 DIAGNOSIS — Z21 Asymptomatic human immunodeficiency virus [HIV] infection status: Secondary | ICD-10-CM | POA: Diagnosis not present

## 2014-01-06 DIAGNOSIS — I129 Hypertensive chronic kidney disease with stage 1 through stage 4 chronic kidney disease, or unspecified chronic kidney disease: Secondary | ICD-10-CM | POA: Diagnosis not present

## 2014-01-06 DIAGNOSIS — N184 Chronic kidney disease, stage 4 (severe): Secondary | ICD-10-CM | POA: Diagnosis not present

## 2014-01-09 ENCOUNTER — Encounter: Payer: Self-pay | Admitting: Vascular Surgery

## 2014-01-10 ENCOUNTER — Ambulatory Visit (INDEPENDENT_AMBULATORY_CARE_PROVIDER_SITE_OTHER): Payer: Self-pay | Admitting: Vascular Surgery

## 2014-01-10 ENCOUNTER — Ambulatory Visit (HOSPITAL_COMMUNITY)
Admission: RE | Admit: 2014-01-10 | Discharge: 2014-01-10 | Disposition: A | Discharge status: Home / Self Care | Payer: Medicare Other | Source: Ambulatory Visit | Attending: Vascular Surgery | Admitting: Vascular Surgery

## 2014-01-10 ENCOUNTER — Encounter: Payer: Self-pay | Admitting: Vascular Surgery

## 2014-01-10 ENCOUNTER — Other Ambulatory Visit: Payer: Self-pay | Admitting: Vascular Surgery

## 2014-01-10 VITALS — BP 154/87 | HR 86 | Resp 18 | Ht 70.0 in | Wt 242.0 lb

## 2014-01-10 DIAGNOSIS — Z4931 Encounter for adequacy testing for hemodialysis: Secondary | ICD-10-CM | POA: Insufficient documentation

## 2014-01-10 DIAGNOSIS — N186 End stage renal disease: Secondary | ICD-10-CM | POA: Insufficient documentation

## 2014-01-10 DIAGNOSIS — Z48812 Encounter for surgical aftercare following surgery on the circulatory system: Secondary | ICD-10-CM

## 2014-01-10 NOTE — Progress Notes (Signed)
Here today for followup of his radiocephalic fistula placed on 12/02/2013 redundancy and 1 12/27/2013 with pain in his hand and elbow. This does not appear to be related to the fistula but is on clears to the cause of this. He did see orthopedics was placed on antibiotics. This did resolve.  Physical exam today his incision is well-healed he has an excellent thrill in his fistula. He underwent duplex today in our office and this shows good early maturation with a 6 mm fistula which runs quite straight with no evidence of stenosis.  Impression and plan: Healed following AV fistula creation on 12/02/2013. I suspect is very high likelihood that this will be acceptable for dialysis if he comes to. He is being evaluated for possible transplant. I would wait to 3 months he uses access. He will see [place] again on an as-needed basis

## 2014-01-12 ENCOUNTER — Other Ambulatory Visit: Payer: Self-pay | Admitting: Infectious Diseases

## 2014-01-13 ENCOUNTER — Encounter: Payer: Self-pay | Admitting: Internal Medicine

## 2014-01-13 ENCOUNTER — Ambulatory Visit (INDEPENDENT_AMBULATORY_CARE_PROVIDER_SITE_OTHER): Payer: Medicare Other | Admitting: Internal Medicine

## 2014-01-13 VITALS — BP 149/88 | HR 86 | Temp 98.5°F | Ht 70.0 in | Wt 240.2 lb

## 2014-01-13 DIAGNOSIS — I10 Essential (primary) hypertension: Secondary | ICD-10-CM | POA: Diagnosis not present

## 2014-01-13 DIAGNOSIS — M109 Gout, unspecified: Secondary | ICD-10-CM

## 2014-01-13 DIAGNOSIS — G894 Chronic pain syndrome: Secondary | ICD-10-CM

## 2014-01-13 DIAGNOSIS — N183 Chronic kidney disease, stage 3 unspecified: Secondary | ICD-10-CM | POA: Diagnosis not present

## 2014-01-13 DIAGNOSIS — B20 Human immunodeficiency virus [HIV] disease: Secondary | ICD-10-CM | POA: Diagnosis not present

## 2014-01-13 MED ORDER — HYDROCODONE-ACETAMINOPHEN 10-325 MG PO TABS: 1.0000 | ORAL_TABLET | Freq: Three times a day (TID) | ORAL | Status: DC | PRN

## 2014-01-13 NOTE — Patient Instructions (Signed)
General Instructions:   Please bring your medicines with you each time you come to clinic.  Medicines may include prescription medications, over-the-counter medications, herbal remedies, eye drops, vitamins, or other pills.   Progress Toward Treatment Goals:  Treatment Goal 11/10/2013  Blood pressure unchanged  Stop smoking smoking the same amount    Self Care Goals & Plans:  Self Care Goal 01/13/2014  Manage my medications take my medicines as prescribed; bring my medications to every visit; refill my medications on time  Monitor my health -  Eat healthy foods -  Be physically active -  Stop smoking -    No flowsheet data found.   Care Management & Community Referrals:  Referral 06/15/2013  Referrals made for care management support none needed

## 2014-01-13 NOTE — Progress Notes (Signed)
Case discussed with Dr. Sadek soon after the resident saw the patient.  We reviewed the resident's history and exam and pertinent patient test results.  I agree with the assessment, diagnosis, and plan of care documented in the resident's note. 

## 2014-01-13 NOTE — Progress Notes (Signed)
Subjective:    Patient ID: JERIMI WRITE, male    DOB: 1965/08/01, 48 y.o.   MRN: [id number]  HPI Mr. Millican is a 47 yo man pmh as listed below here for f/u on gout and need for refill of pain medication.   The patient was recently seen on 01/08/14 for another gout flare but the patient's symptoms seem to be resolving at that time and therefore steroids were not used but management with opiates. The patient has dramatically improved since that time he continues to improve with symptomatically management and he is taking all his medications now as prescribed.he is otherwise doing well and not having any pain above his baseline.   The patient is anticipating transplant from his daughter and has received the "package" from [facility] where he plans to have the surgery. Therefore he is getting all of his labs and meeting with Dr. Moshe Cipro for coordination of his anticipated renal transplant.   Past Medical History  Diagnosis Date  . 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 Hypertension   . Seizures     last seizure was >5 years ago, pt has family history of seizures  . Syphilis 1997    history of syphilis 1997  . Chronic kidney disease     stage 3-4 CKD, followed by Dr. Moshe Cipro  . 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 Rib fractures 01/2009  . HIV infection 1980's    on ART therapy since, followed by ID clinic, complicated  by neuropathy  . 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 Pneumonia   . Arthritis   . Anemia    Current Outpatient Prescriptions on File Prior to Visit  Medication Sig Dispense Refill  . acetaminophen (TYLENOL) 500 MG tablet Take 1,000 mg by mouth daily as needed for mild pain.      . carvedilol (COREG) 25 MG tablet Take 50 mg by mouth 2 (two) times daily with a meal.       . colchicine 0.6 MG tablet Take 0.6 mg by mouth daily.      . Darunavir Ethanolate (PREZISTA) 800 MG tablet Take 800 mg by mouth daily with breakfast.      . doxazosin (CARDURA) 4 MG tablet Take 4 mg by mouth 2 (two) times daily.       . febuxostat (ULORIC) 40 MG tablet Take 40 mg by mouth daily.      . fluticasone (FLONASE) 50 MCG/ACT nasal spray Place 1 spray into both nostrils daily as needed for allergies or rhinitis.      . furosemide (LASIX) 40 MG tablet Take 40-80 mg by mouth 2 (two) times daily. Take 80 mg every morning and 40 mg every evening.      Marland Kitchen HYDROcodone-acetaminophen (NORCO) 10-325 MG per tablet Take 1 tablet by mouth every 8 (eight) hours as needed for moderate pain.  30 tablet  0  . ISENTRESS 400 MG tablet TAKE 1 TABLET BY MOUTH TWICE A DAY  60 tablet  6  . lamiVUDine-zidovudine (COMBIVIR) 150-300 MG per tablet Take 1 tablet by mouth 2 (two) times daily.      . montelukast (SINGULAIR) 10 MG tablet Take 10 mg by mouth 2 (two) times daily.      Marland Kitchen oxyCODONE (OXY IR/ROXICODONE) 5 MG immediate release tablet Take  1 tablet (5 mg total) by mouth every 6 (six) hours as needed for severe pain.  12 tablet  0  . raltegravir (ISENTRESS) 400 MG tablet Take 400 mg by mouth 2 (two) times daily.      . ritonavir (NORVIR) 100 MG TABS tablet Take 1 tablet (100 mg total) by mouth daily.  30 tablet  11   No current facility-administered medications on file prior to visit.   Social, surgical, family history reviewed with patient and updated in appropriate chart locations.   Review of Systems  Negative except As listed in history of present illness     Objective:   Physical Exam Filed Vitals:   01/13/14 1424  BP: 149/88  Pulse: 86  Temp: 98.5 F (36.9 C)   General: Sitting in chair HEENT: PERRL, EOMI, no scleral icterus Cardiac: RRR, no rubs, murmurs or gallops Pulm: clear to auscultation bilaterally, moving normal volumes of air Abd: soft, nontender, nondistended, BS present Ext:  warm and well perfused, no pedal edema, elbow nontender to palpation, no erythema, no tophi, right arm AV fistula with good palpable thrill and audible bruit Neuro: alert and oriented X3, cranial nerves II-XII grossly intact    Assessment & Plan:  Please see problem oriented charting  Pt discussed with Dr. Marinda Elk

## 2014-01-13 NOTE — Assessment & Plan Note (Signed)
Patient has had several recurrent gout attacks and this most recent events seems to be coordinated with his recent right radiocephalic AV fistula placement. The patient continues to improve clinically. The patient has a pain contract with his PCP for his gout pain. -Will not start prednisone at this time -Continue to recommend compliance with [facility]  -refill of Norco 10-325 q8h prn for OA/gout pain #90 as per contract

## 2014-01-25 DIAGNOSIS — N183 Chronic kidney disease, stage 3 unspecified: Secondary | ICD-10-CM | POA: Diagnosis not present

## 2014-01-31 ENCOUNTER — Telehealth: Payer: Self-pay | Admitting: *Deleted

## 2014-01-31 NOTE — Telephone Encounter (Signed)
RN reviewed Dr. Storm Frisk last office visit note.  Per her note these are the correct medications and doses.  The current medication profile is not consistent with Dr. Storm Frisk last office note.  [name] please advise about pt's HIV regimen.

## 2014-02-01 ENCOUNTER — Telehealth: Payer: Self-pay | Admitting: *Deleted

## 2014-02-01 DIAGNOSIS — B20 Human immunodeficiency virus [HIV] disease: Secondary | ICD-10-CM

## 2014-02-01 MED ORDER — DARUNAVIR ETHANOLATE 800 MG PO TABS: 800.0000 mg | ORAL_TABLET | Freq: Every day | ORAL | Status: DC

## 2014-02-01 MED ORDER — RITONAVIR 100 MG PO TABS: 100.0000 mg | ORAL_TABLET | Freq: Every day | ORAL | Status: DC

## 2014-02-01 MED ORDER — LAMIVUDINE 100 MG PO TABS: 100.0000 mg | ORAL_TABLET | Freq: Every day | ORAL | Status: DC

## 2014-02-01 MED ORDER — ZIDOVUDINE 300 MG PO TABS: 300.0000 mg | ORAL_TABLET | Freq: Two times a day (BID) | ORAL | Status: DC

## 2014-02-01 MED ORDER — RALTEGRAVIR POTASSIUM 400 MG PO TABS: 400.0000 mg | ORAL_TABLET | Freq: Two times a day (BID) | ORAL | Status: DC

## 2014-02-01 NOTE — Telephone Encounter (Signed)
All medication changes per verbal order of Dr. Carlyle Basques.

## 2014-02-02 ENCOUNTER — Telehealth: Payer: Self-pay | Admitting: *Deleted

## 2014-02-02 ENCOUNTER — Ambulatory Visit (INDEPENDENT_AMBULATORY_CARE_PROVIDER_SITE_OTHER): Payer: Medicare Other | Admitting: Internal Medicine

## 2014-02-02 ENCOUNTER — Encounter: Payer: Self-pay | Admitting: Internal Medicine

## 2014-02-02 VITALS — BP 170/93 | HR 90 | Temp 98.9°F | Ht 71.0 in | Wt 247.0 lb

## 2014-02-02 DIAGNOSIS — M10071 Idiopathic gout, right ankle and foot: Secondary | ICD-10-CM

## 2014-02-02 DIAGNOSIS — M109 Gout, unspecified: Secondary | ICD-10-CM

## 2014-02-02 MED ORDER — PREDNISONE 10 MG PO TABS: ORAL_TABLET | ORAL | Status: DC

## 2014-02-02 NOTE — Progress Notes (Signed)
Subjective:    Patient ID: Darrell SCHRAG, male    DOB: 04-Feb-1966, 48 y.o.   MRN: [id number]  HPI 48 yo M with HIV disease,  Currently on RLG/DRVr, retrovir. He was seen at the [facility] transplant clinic to be evaluated as a candidate. He states he is doing well with exception to having a gouty flare involving right ankle. Only on allopurinol presently.  Current Outpatient Prescriptions on File Prior to Visit  Medication Sig Dispense Refill  . acetaminophen (TYLENOL) 500 MG tablet Take 1,000 mg by mouth daily as needed for mild pain.      . carvedilol (COREG) 25 MG tablet Take 50 mg by mouth 2 (two) times daily with a meal.      . Darunavir Ethanolate (PREZISTA) 800 MG tablet Take 1 tablet (800 mg total) by mouth daily with breakfast.  30 tablet  5  . furosemide (LASIX) 40 MG tablet Take 40-80 mg by mouth 2 (two) times daily. Take 80 mg every morning and 40 mg every evening.      . lamivudine (EPIVIR) 100 MG tablet Take 1 tablet (100 mg total) by mouth daily.  30 tablet  5  . montelukast (SINGULAIR) 10 MG tablet Take 10 mg by mouth 2 (two) times daily.      . raltegravir (ISENTRESS) 400 MG tablet Take 1 tablet (400 mg total) by mouth 2 (two) times daily.  60 tablet  5  . ritonavir (NORVIR) 100 MG TABS tablet Take 1 tablet (100 mg total) by mouth daily.  30 tablet  5  . zidovudine (RETROVIR) 300 MG tablet Take 1 tablet (300 mg total) by mouth 2 (two) times daily.  60 tablet  5  . HYDROcodone-acetaminophen (NORCO) 10-325 MG per tablet Take 1 tablet by mouth every 8 (eight) hours as needed for moderate pain.  90 tablet  0   No current facility-administered medications on file prior to visit.     Review of Systems     Objective:   Physical Exam BP 170/93  Pulse 90  Temp(Src) 98.9 F (37.2 C) (Oral)  Ht 5\' 11"  (1.803 m)  Wt 247 lb (112.038 kg)  BMI 34.46 kg/m2 Physical Exam  Constitutional: He is oriented to person, place, and time. He appears well-developed and well-nourished. No  distress.  HENT:  Mouth/Throat: Oropharynx is clear and moist. No oropharyngeal exudate.  Cardiovascular: Normal rate, regular rhythm and normal heart sounds. Exam reveals no gallop and no friction rub.  No murmur heard.  Pulmonary/Chest: Effort normal and breath sounds normal. No respiratory distress. He has no wheezes.  Abdominal: Soft. Bowel sounds are normal. He exhibits no distension. There is no tenderness.  Lymphadenopathy:  He has no cervical adenopathy Ext: decrease range of motion to right ankle due to pain, no swelling Neurological: He is alert and oriented to person, place, and time.  Skin: Skin is warm and dry. No rash noted. No erythema.  Psychiatric: He has a normal mood and affect. His behavior is normal.     Labs: Lab Results  Component Value Date   CD4TCELL 32* 10/20/2013   CD4TABS 490 10/20/2013   Lab Results  Component Value Date   HIV1RNAQUANT <20 10/20/2013        Assessment & Plan:  Gout - will give him prednisone taper to help with symptoms since should not give colchicine given his CKD - hiv = well controlled on current regimen. Will review his regimen to see if can streamline it.  -  uptodate on std and vaccine schedule

## 2014-02-02 NOTE — Telephone Encounter (Signed)
Per patient, he is not taking uloric due to cost.  He states he takes allopurinol.  RN contacted the pharmacy to discontinue other medications as per [name] (fluticasone, colchicine).  Pharmacy states that he does not have a prescription for allopurinol.  Will forward to his PCP for advice/rx. Landis Gandy, RN

## 2014-02-02 NOTE — Telephone Encounter (Signed)
This message will be forwarded to PCP.

## 2014-02-05 ENCOUNTER — Other Ambulatory Visit: Payer: Self-pay | Admitting: Internal Medicine

## 2014-02-06 NOTE — Telephone Encounter (Signed)
Pls sch routine appt with PCP next 90 days. Pt was just seen June but only pain addressed

## 2014-02-08 MED ORDER — ALLOPURINOL 100 MG PO TABS: 100.0000 mg | ORAL_TABLET | Freq: Every day | ORAL | Status: DC

## 2014-02-08 NOTE — Addendum Note (Signed)
Addended by: Kandace Parkins on: 02/08/2014 11:14 AM   Modules accepted: Orders

## 2014-02-15 ENCOUNTER — Other Ambulatory Visit: Payer: Self-pay | Admitting: Internal Medicine

## 2014-02-21 ENCOUNTER — Other Ambulatory Visit: Payer: Self-pay | Admitting: Infectious Diseases

## 2014-02-24 DIAGNOSIS — D631 Anemia in chronic kidney disease: Secondary | ICD-10-CM | POA: Diagnosis not present

## 2014-02-24 DIAGNOSIS — N2581 Secondary hyperparathyroidism of renal origin: Secondary | ICD-10-CM | POA: Diagnosis not present

## 2014-03-03 ENCOUNTER — Other Ambulatory Visit (HOSPITAL_COMMUNITY): Payer: Self-pay | Admitting: *Deleted

## 2014-03-06 ENCOUNTER — Ambulatory Visit (HOSPITAL_COMMUNITY)
Admission: RE | Admit: 2014-03-06 | Discharge: 2014-03-06 | Disposition: A | Discharge status: Home / Self Care | Payer: Medicare Other | Source: Ambulatory Visit | Attending: Nephrology | Admitting: Nephrology

## 2014-03-06 DIAGNOSIS — N186 End stage renal disease: Secondary | ICD-10-CM | POA: Diagnosis not present

## 2014-03-06 DIAGNOSIS — B20 Human immunodeficiency virus [HIV] disease: Secondary | ICD-10-CM | POA: Insufficient documentation

## 2014-03-06 DIAGNOSIS — I120 Hypertensive chronic kidney disease with stage 5 chronic kidney disease or end stage renal disease: Secondary | ICD-10-CM | POA: Diagnosis not present

## 2014-03-06 DIAGNOSIS — D539 Nutritional anemia, unspecified: Secondary | ICD-10-CM | POA: Insufficient documentation

## 2014-03-06 MED ORDER — SODIUM CHLORIDE 0.9 % IV SOLN
1020.0000 mg | Freq: Once | INTRAVENOUS | Status: AC
  Administered 2014-03-06: 1020 mg via INTRAVENOUS
  Filled 2014-03-06: qty 34

## 2014-03-24 ENCOUNTER — Emergency Department (HOSPITAL_COMMUNITY): Payer: Medicare Other

## 2014-03-24 ENCOUNTER — Encounter (HOSPITAL_COMMUNITY): Payer: Self-pay | Admitting: Emergency Medicine

## 2014-03-24 ENCOUNTER — Emergency Department (HOSPITAL_COMMUNITY)
Admission: EM | Admit: 2014-03-24 | Discharge: 2014-03-24 | Disposition: A | Discharge status: Home / Self Care | Payer: Medicare Other | Attending: Emergency Medicine | Admitting: Emergency Medicine

## 2014-03-24 ENCOUNTER — Ambulatory Visit (HOSPITAL_COMMUNITY): Payer: Medicare Other

## 2014-03-24 DIAGNOSIS — Z8639 Personal history of other endocrine, nutritional and metabolic disease: Secondary | ICD-10-CM | POA: Diagnosis not present

## 2014-03-24 DIAGNOSIS — M19079 Primary osteoarthritis, unspecified ankle and foot: Secondary | ICD-10-CM | POA: Diagnosis not present

## 2014-03-24 DIAGNOSIS — I129 Hypertensive chronic kidney disease with stage 1 through stage 4 chronic kidney disease, or unspecified chronic kidney disease: Secondary | ICD-10-CM | POA: Insufficient documentation

## 2014-03-24 DIAGNOSIS — M25469 Effusion, unspecified knee: Secondary | ICD-10-CM | POA: Diagnosis not present

## 2014-03-24 DIAGNOSIS — N184 Chronic kidney disease, stage 4 (severe): Secondary | ICD-10-CM | POA: Diagnosis not present

## 2014-03-24 DIAGNOSIS — Z8781 Personal history of (healed) traumatic fracture: Secondary | ICD-10-CM | POA: Insufficient documentation

## 2014-03-24 DIAGNOSIS — Z862 Personal history of diseases of the blood and blood-forming organs and certain disorders involving the immune mechanism: Secondary | ICD-10-CM | POA: Insufficient documentation

## 2014-03-24 DIAGNOSIS — Z79899 Other long term (current) drug therapy: Secondary | ICD-10-CM | POA: Insufficient documentation

## 2014-03-24 DIAGNOSIS — Z8701 Personal history of pneumonia (recurrent): Secondary | ICD-10-CM | POA: Diagnosis not present

## 2014-03-24 DIAGNOSIS — M7989 Other specified soft tissue disorders: Secondary | ICD-10-CM | POA: Diagnosis not present

## 2014-03-24 DIAGNOSIS — M109 Gout, unspecified: Secondary | ICD-10-CM

## 2014-03-24 DIAGNOSIS — M79609 Pain in unspecified limb: Secondary | ICD-10-CM | POA: Diagnosis not present

## 2014-03-24 DIAGNOSIS — I10 Essential (primary) hypertension: Secondary | ICD-10-CM | POA: Diagnosis not present

## 2014-03-24 DIAGNOSIS — Z21 Asymptomatic human immunodeficiency virus [HIV] infection status: Secondary | ICD-10-CM | POA: Insufficient documentation

## 2014-03-24 DIAGNOSIS — F17200 Nicotine dependence, unspecified, uncomplicated: Secondary | ICD-10-CM | POA: Diagnosis not present

## 2014-03-24 DIAGNOSIS — Z8619 Personal history of other infectious and parasitic diseases: Secondary | ICD-10-CM | POA: Diagnosis not present

## 2014-03-24 MED ORDER — OXYCODONE HCL 5 MG PO TABS: 5.0000 mg | ORAL_TABLET | Freq: Four times a day (QID) | ORAL | Status: DC | PRN

## 2014-03-24 MED ORDER — PREDNISONE 20 MG PO TABS: ORAL_TABLET | ORAL | Status: DC

## 2014-03-24 MED ORDER — PREDNISONE 20 MG PO TABS
60.0000 mg | ORAL_TABLET | Freq: Once | ORAL | Status: AC
  Administered 2014-03-24: 60 mg via ORAL
  Filled 2014-03-24: qty 3

## 2014-03-24 NOTE — Progress Notes (Signed)
*  Preliminary Results* Right lower extremity venous duplex completed. Right lower extremity is negative for deep vein thrombosis. There is no evidence of right Baker's cyst.  03/24/2014 7:16 PM  Maudry Mayhew, RVT, RDCS, RDMS

## 2014-03-24 NOTE — ED Notes (Signed)
Pt requesting to be sent home and to follow up for additional x-ray. PA [place] notified.

## 2014-03-24 NOTE — ED Notes (Signed)
Pt reports gout flair to right knee and foot; taking allipurinol and not helping. Graft in place in right arm for future dialysis; denies chest pain or SOB. Right foot is swollen and painful as well.

## 2014-03-24 NOTE — ED Notes (Signed)
NAD at this time. Pt ambulated independently.

## 2014-03-24 NOTE — ED Provider Notes (Signed)
CSN: [id number]     Arrival date & time 03/24/14  1519 History  This chart was scribed for Jamse Mead, PA-C working with Threasa Beards, MD by Randa Evens, ED Scribe. This patient was seen in room TR10C/TR10C and the patient's care was started at 5:38 PM.     Chief Complaint  Patient presents with  . Gout   The history is provided by the patient. No language interpreter was used.   HPI Comments: Darrell Johnson is a 48 y.o. male with PMHx of hyperlipemia, hypertension, seizures, syphilis, chronic kidney disease, sexually transmitted disease, and HIV who presents to the Emergency Department complaining of gout flair to his right knee and right foot onset 1 week prior. He states that his pain is aching and constant. He states that the pain is causing him to have difficulty ambulating. He states that his ankle is warm to touch.  He states that he is taking some dollar tree pain medication with no relief. He denies any injury or trauma to the foot. He states that he has had previous episode of gout outbreak with similar symptoms in his right arm in July and was seen by Dr. Will Bonnet.  Denies chest pain, fever, chills or numbness, red streaks, fall, injury, loss of sensation.   He states that he CD4 count is under control and has been taking all of his medications as prescribed.  PCP Dr. Will Bonnet Nephrologist Dr. Moshe Cipro   Past Medical History  Diagnosis Date  . 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 Hypertension   . Seizures     last seizure was >5 years ago, pt has family history of seizures  . Syphilis 1997    history of syphilis 1997  . Chronic kidney disease     stage 3-4 CKD, followed by Dr. Moshe Cipro  . 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 Rib fractures 01/2009  . HIV  infection 1980's    on ART therapy since, followed by ID clinic, complicated  by neuropathy  . 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 Pneumonia   . Arthritis   . Anemia    Past Surgical History  Procedure Laterality Date  . Abscess drainage    . Av fistula placement Right 12/02/2013    Procedure: ARTERIOVENOUS (AV) FISTULA CREATION;  Surgeon: Rosetta Posner, MD;  Location: [facility] OR;  Service: Vascular;  Laterality: Right;   Family History  Problem Relation Age of Onset  . Cancer Mother   . COPD Father   . Diabetes Sister   . Hypertension Sister   . Diabetes Brother   . Hypertension Brother   . Stroke Neg Hx    History  Substance Use Topics  . Smoking status: Current Every Day Smoker -- 0.30 packs/day for 20 years    Types: Cigarettes  . Smokeless tobacco: Never Used     Comment: decreasing.  . Alcohol Use: Yes     Comment: Vodka sometimes.    Review of Systems  Constitutional: Negative for fever and chills.  Cardiovascular: Negative for chest pain.  Musculoskeletal: Positive for arthralgias, gait problem and joint swelling.  Neurological: Negative for numbness.    Allergies  Review of patient's allergies indicates no known allergies.  Home Medications   Prior to Admission medications   Medication Sig  Start Date End Date Taking? Authorizing Provider  allopurinol (ZYLOPRIM) 100 MG tablet Take 1 tablet (100 mg total) by mouth daily. 02/08/14  Yes Tasrif Ahmed, MD  carvedilol (COREG) 25 MG tablet Take 50 mg by mouth 2 (two) times daily with a meal.   Yes Historical Provider, MD  Darunavir Ethanolate (PREZISTA) 800 MG tablet Take 1 tablet (800 mg total) by mouth daily with breakfast. 02/01/14  Yes Carlyle Basques, MD  doxazosin (CARDURA) 8 MG tablet Take 8 mg by mouth at bedtime.   Yes Historical Provider, MD  furosemide (LASIX) 40 MG tablet Take 40-80 mg by mouth 2 (two) times daily. Take 80 mg every morning and 40 mg every evening.   Yes Historical Provider, MD  lamivudine (EPIVIR) 100  MG tablet Take 1 tablet (100 mg total) by mouth daily. 02/01/14  Yes Carlyle Basques, MD  montelukast (SINGULAIR) 10 MG tablet Take 10 mg by mouth 2 (two) times daily. 11/03/13  Yes Carlyle Basques, MD  raltegravir (ISENTRESS) 400 MG tablet Take 1 tablet (400 mg total) by mouth 2 (two) times daily. 02/01/14  Yes Carlyle Basques, MD  ritonavir (NORVIR) 100 MG TABS tablet Take 1 tablet (100 mg total) by mouth daily. 02/01/14  Yes Carlyle Basques, MD  zidovudine (RETROVIR) 300 MG tablet Take 1 tablet (300 mg total) by mouth 2 (two) times daily. 02/01/14  Yes Carlyle Basques, MD  oxyCODONE (ROXICODONE) 5 MG immediate release tablet Take 1 tablet (5 mg total) by mouth every 6 (six) hours as needed for severe pain. 03/24/14   Daleyza Gadomski, PA-C  predniSONE (DELTASONE) 20 MG tablet 3 tabs po day one, then 2 tabs daily x 4 days 03/24/14   Havah Ammon, PA-C   Triage Vitals: BP 185/96  Pulse 77  Temp(Src) 98.1 F (36.7 C) (Oral)  SpO2 99%  Physical Exam  Nursing note and vitals reviewed. Constitutional: He is oriented to person, place, and time. He appears well-developed and well-nourished. No distress.  HENT:  Head: Normocephalic and atraumatic.  Mouth/Throat: Oropharynx is clear and moist. No oropharyngeal exudate.  Eyes: Conjunctivae and EOM are normal. Pupils are equal, round, and reactive to light. Right eye exhibits no discharge. Left eye exhibits no discharge.  Neck: Normal range of motion. Neck supple. No tracheal deviation present.  Negative neck stiffness Negative nuchal rigidity Negative cervical lymphadenopathy Negative meningeal signs  Cardiovascular: Normal rate, regular rhythm and normal heart sounds.  Exam reveals no friction rub.   No murmur heard. Pulses:      Radial pulses are 2+ on the right side, and 2+ on the left side.       Dorsalis pedis pulses are 2+ on the right side, and 2+ on the left side.  Cap refill less than 3 seconds   AV fistula palpated to the right upper  extremity, proximal to the wrist - good thrill   Pulmonary/Chest: Effort normal and breath sounds normal. No respiratory distress. He has no wheezes. He has no rales.  Patient is able to speak in full sentences without difficulty Negative use of accessory muscles Negative stridor  Musculoskeletal: Normal range of motion. He exhibits edema and tenderness.       Right knee: He exhibits swelling. He exhibits normal range of motion, no effusion, no ecchymosis, no deformity, no laceration, no erythema and normal alignment. Tenderness found. Medial joint line, lateral joint line, MCL, LCL and patellar tendon tenderness noted.       Right ankle: He exhibits swelling. He exhibits normal  range of motion, no ecchymosis, no deformity and no laceration. Tenderness.       Legs:      Feet:  Swelling identified to the right knee and right foot, dorsal aspect, with discomfort upon palpation. Discomfort upon palpation circumferentially knee. Discomfort upon palpation to the dorsal aspect of the right foot. Mild warmth upon palpation. Negative erythema, inflammation, lesions, sores, deformities, malalignment, which does. If patient is able to flex and extend the right knee without difficulty or ataxia. Patient stable to wiggle the toes of the right foot without difficulty. Full range of motion to the right ankle.  Lymphadenopathy:    He has no cervical adenopathy.  Neurological: He is alert and oriented to person, place, and time. No cranial nerve deficit. He exhibits normal muscle tone. Coordination normal.  Cranial nerves III-XII grossly intact Strength 5+/5+ to upper and lower extremities bilaterally with resistance applied, equal distribution noted Strength intact to digits of the feet bilaterally Sensation intact with differentiation to sharp and dull touch Negative arm drift Fine motor skills intact Gait proper, proper balance - negative sway, negative drift, negative step-offs  Skin: Skin is warm and dry.  No rash noted. No erythema.  Psychiatric: He has a normal mood and affect. His behavior is normal.    ED Course  Procedures (including critical care time) DIAGNOSTIC STUDIES: Oxygen Saturation is 99% on RA, normal by my interpretation.    COORDINATION OF CARE: 6:09 PM-Discussed treatment plan which includes x-rays of right knee and ankle with pt at bedside and pt agreed to plan.   6:16 PM This provider spoke with attending physician, Dr. Alfonzo Beers who recommended Doppler to be performed. Agreed to plain films. Does not believe to be septic joint.   Labs Review Labs Reviewed - No data to display  Imaging Review Dg Ankle Complete Right  03/24/2014   CLINICAL DATA:  Right ankle pain.  No known injury.  EXAM: RIGHT ANKLE - COMPLETE 3+ VIEW  COMPARISON:  None.  FINDINGS: There is no evidence of fracture, dislocation, or joint effusion. Mild diffuse soft tissue swelling noted. Mild degenerative spurring of the ankle joint also seen, without significant joint space narrowing.  IMPRESSION: Mild soft tissue swelling.  No evidence of fracture or dislocation.  Mild ankle DJD.   Electronically Signed   By: Earle Gell M.D.   On: 03/24/2014 19:01   Dg Knee Complete 4 Views Right  03/24/2014   CLINICAL DATA:  Right knee pain  EXAM: RIGHT KNEE - COMPLETE 4+ VIEW  COMPARISON:  None.  FINDINGS: No fracture or dislocation is seen.  Very mild tricompartmental degenerative changes.  Moderate suprapatellar knee joint effusion.  IMPRESSION: Very mild degenerative changes with moderate suprapatellar knee joint effusion.   Electronically Signed   By: Julian Hy M.D.   On: 03/24/2014 18:57     EKG Interpretation None      MDM   Final diagnoses:  Gouty arthritis   Medications  predniSONE (DELTASONE) tablet 60 mg (60 mg Oral Given 03/24/14 1920)   Filed Vitals:   03/24/14 1636 03/24/14 1919 03/24/14 2020  BP: 185/96 189/96 182/93  Pulse: 77 18 77  Temp: 98.1 F (36.7 C) 98.5 F (36.9 C)    TempSrc: Oral Oral   Resp:  18 22  SpO2: 99% 100% 99%   This provider reviewed patient's chart. Patient seen and assessed by Dr. Will Bonnet, infectious disease, on 02/02/2014 regarding gout symptoms. Patient was given prednisone with relief. Patient cannot take colchicine  secondary to chronic kidney disease-AV fistula placed in right upper extremity. Plain film of right knee identified very mild degenerative changes with moderate suprapatellar knee joint effusion. Right ankle noted soft tissue swelling with no evidence of fracture dislocation. Doppler of right lower extremity negative for acute findings - negative DVTs or acute thrombosis noted. Plain film of right foot ordered, but patient did not want to stay any longer. Reported that he can get an xray as an outpatient by his PCP. Patient reported that this is his gout.  Doubt cellulitis. Doubt septic joint. Negative signs of ischemia. Pulses palpable and strong. Sensation intact. Negative focal neurological deficits. Gait proper with-negative step-offs or sway. Suspicion to be gout. Patient reports he has gotten this in the past and symptoms feel similar. Patient stable, afebrile. Patient not septic appearing. Discussed case with attending physician, Dr. Canary Brim, who agreed to plan of care-agree to plan of discharge-attending does not believe this to be septic joint. Discharged patient. Discharged patient with prednisone. Discussed with patient to rest and stay hydrated. Referred to PCP and ID. Discussed with patient to closely monitor symptoms and if symptoms are to worsen or change to report back to the ED - strict return instructions given.  Patient agreed to plan of care, understood, all questions answered.   [employer], PA-C 03/25/14 [phone]

## 2014-03-24 NOTE — ED Notes (Signed)
PA at bedside.

## 2014-03-24 NOTE — Discharge Instructions (Signed)
Please call your doctor for a followup appointment within 24-48 hours. When you talk to your doctor please let them know that you were seen in the emergency department and have them acquire all of your records so that they can discuss the findings with you and formulate a treatment plan to fully care for your new and ongoing problems. Please call and set up an appointment with your primary care provider and orthopedics Please take medications as prescribed and on a full stomach Please take medications as prescribed - while on pain medications there is to be no drinking alcohol, driving, operating any heavy machinery. If extra please dispose in a proper manner. Please do not take any extra Tylenol with this medication for this can lead to Tylenol overdose and liver issues.  Please avoid any physical or strenuous activity Please continue to monitor symptoms closely and if symptoms are to worsen or change (fever greater than 101, chills, sweating, nausea, vomiting, chest pain, shortness of breathe, difficulty breathing, weakness, numbness, tingling, worsening or changes to pain pattern, red streaks, hot to the touch, decreased range of motion, fall, injury) please report back to the Emergency Department immediately.    Gout Gout is when your joints become red, sore, and swell (inflamed). This is caused by the buildup of uric acid crystals in the joints. Uric acid is a chemical that is normally in the blood. If the level of uric acid gets too high in the blood, these crystals form in your joints and tissues. Over time, these crystals can form into masses near the joints and tissues. These masses can destroy bone and cause the bone to look misshapen (deformed). HOME CARE   Do not take aspirin for pain.  Only take medicine as told by your doctor.  Rest the joint as much as you can. When in bed, keep sheets and blankets off painful areas.  Keep the sore joints raised (elevated).  Put warm or cold packs  on painful joints. Use of warm or cold packs depends on which works best for you.  Use crutches if the painful joint is in your leg.  Drink enough fluids to keep your pee (urine) clear or pale yellow. Limit alcohol, sugary drinks, and drinks with fructose in them.  Follow your diet instructions. Pay careful attention to how much protein you eat. Include fruits, vegetables, whole grains, and fat-free or low-fat milk products in your daily diet. Talk to your doctor or dietitian about the use of coffee, vitamin C, and cherries. These may help lower uric acid levels.  Keep a healthy body weight. GET HELP RIGHT AWAY IF:   You have watery poop (diarrhea), throw up (vomit), or have any side effects from medicines.  You do not feel better in 24 hours, or you are getting worse.  Your joint becomes suddenly more tender, and you have chills or a fever. MAKE SURE YOU:   Understand these instructions.  Will watch your condition.  Will get help right away if you are not doing well or get worse. Document Released: 04/15/2008 Document Revised: 11/21/2013 Document Reviewed: 02/18/2012 [facility] Patient Information 2015 [place], [place]. This information is not intended to replace advice given to you by your health care provider. Make sure you discuss any questions you have with your health care provider.   Emergency Department Resource Guide 1) Find a Doctor and Pay Out of Pocket Although you won't have to find out who is covered by your insurance plan, it is a good idea  to ask around and get recommendations. You will then need to call the office and see if the doctor you have chosen will accept you as a new patient and what types of options they offer for patients who are self-pay. Some doctors offer discounts or will set up payment plans for their patients who do not have insurance, but you will need to ask so you aren't surprised when you get to your appointment.  2) Contact Your Local Health  Department Not all health departments have doctors that can see patients for sick visits, but many do, so it is worth a call to see if yours does. If you don't know where your local health department is, you can check in your phone book. The CDC also has a tool to help you locate your state's health department, and many state websites also have listings of all of their local health departments.  3) Find a [facility] If your illness is not likely to be very severe or complicated, you may want to try a walk in clinic. These are popping up all over the country in pharmacies, drugstores, and shopping centers. They're usually staffed by nurse practitioners or physician assistants that have been trained to treat common illnesses and complaints. They're usually fairly quick and inexpensive. However, if you have serious medical issues or chronic medical problems, these are probably not your best option.  No Primary Care Doctor: - Call Health Connect at  [phone] - they can help you locate a primary care doctor that  accepts your insurance, provides certain services, etc. - Physician Referral Service- [phone]  Chronic Pain Problems: Organization         Address  Phone   Notes  [facility]  [phone] Patients need to be referred by their primary care doctor.   Medication Assistance: Organization         Address  Phone   Notes  [facility] Medication [facility] [place] [place], [place] [address] [phone] --Must be a resident of [facility] -- Must have NO insurance coverage whatsoever (no Medicaid/ Medicare, etc.) -- The pt. MUST have a primary care doctor that directs their care regularly and follows them in the community   MedAssist  [phone]   [employer]  [phone]    Agencies that provide inexpensive medical care: Organization         Address  Phone   Notes  [place]  [phone]   [facility]    [phone]   [facility] [place], [employer] [address] [phone]   [place] [address] [place] [phone]   Planned Parenthood    [phone]   [facility]    [phone]   [address], [place] Phone:  [phone], Fax:  [phone] Hours of Operation:  9 am - 6 pm, M-F.  Also accepts Medicaid/Medicare and self-pay.  [facility] for [place], [address], [place] Phone: [phone], Fax: [phone]. Hours of Operation:  8:30 am - 5:30 pm, M-F.  Also accepts Medicaid and self-pay.  [facility] [place] [address], [place] Phone: [phone]   [place], [place], [place] [phone], Ext. [phone] Mondays & Thursdays: 7-9 AM.  First 15 patients  are seen on a first come, first serve basis.    [place] Providers:  Organization         Address  Phone   Notes  [facility] [address], [place] [phone] Also accepts self-pay patients.  [facility] [id number] [place], [place]  [phone]   [place], [address], [place] [phone]   [facility] [address], [place] [phone]   Lucianne Lei [address], [place]   [phone] Only accepts [place] Access [place] patients after they have their name applied to their card.   Self-Pay (no insurance) in [facility]:  Organization         Address  Phone   Notes  Sickle Cell Patients, [facility] Internal Medicine [place] [phone]   [facility] [place] [phone]   [facility] [place]  [place], [place],  [place] [phone]   [facility]/Dr. Osei-Bonsu  [address], [place] or [address], [place] [phone] Phone number for both [place] and [place] locations is the same.  [facility] [address], [place] [phone]   [facility] [address] [place] or [address] [phone]   [facility] [address] [place] [phone], phone; [phone], fax Sees patients 1st and 3rd Saturday of every month.  Must not qualify for public or private insurance (i.e. Medicaid, Medicare, [place] Health Choice, Veterans' Benefits)  Household income should be no more than 200% of the poverty level The clinic cannot treat you if you are pregnant or think you are pregnant  Sexually transmitted diseases are not treated at the clinic.    Dental Care: Organization         Address  Phone  Notes  [facility] Department of [facility] [place] [phone] Accepts children up to age 67 who are enrolled in [place] or [place]; pregnant women with a Medicaid card; and children who have applied for Medicaid or [place] Health Choice, but were declined, whose parents can pay a reduced fee at time of service.  [facility] Department of [facility]  [address], [place] [phone] Accepts children up to age 74 who are enrolled in [place] or [place]; pregnant women with a Medicaid card; and children who have applied for Medicaid or [place] Health Choice, but were declined, whose parents can pay a reduced fee at time of service.  [place] Adult Dental Access PROGRAM  [place] [phone] Patients are seen by appointment only. Walk-ins are not accepted. [place] will see patients 54 years of age and older. Monday - Tuesday (8am-5pm) Most Wednesdays  (8:30-5pm) $30 per visit, cash only  [facility] Adult Dental Access PROGRAM  [address], [facility] [phone] Patients are seen by appointment only. Walk-ins are not accepted. [place] will see patients 34 years of age and older. One Wednesday Evening (Monthly: Volunteer Based).  $30 per visit, cash only  [place]  [phone] for adults; Children under age 23, call Graduate Pediatric Dentistry at (  [phone]. Children aged 75-14, please call [phone] to request a pediatric application.  Dental services are provided in all areas of dental care including fillings, crowns and bridges, complete and partial dentures, implants, gum treatment, root canals, and extractions. Preventive care is also provided. Treatment is provided to both adults and children. Patients are selected via a lottery and there is often a waiting list.   [facility] [address] [place]  [phone] [email or web address]   [facility] [address] [place], [place] [phone], Ext. [phone] Second and Fourth Thursday of each month, opens at 6:30 AM; Clinic ends at 9 AM.  Patients are seen on a first-come first-served basis, and a limited number are seen during each clinic.   [facility]  [address] Hillard Danker [place], [place] [phone]   Eligibility Requirements You must have lived in [place], [place], or [place] counties for at least the last three months.   You cannot be eligible for state or federal sponsored [employer], including [employer], [place], or [employer].   You generally cannot be eligible for healthcare insurance through your employer.    How to apply: Eligibility screenings are held every Tuesday and Wednesday afternoon from 1:00 pm until 4:00 pm. You do not need an appointment for the interview!  [facility] [address] [place], [place]   [place]  [place] Department  [place]  [phone]    Behavioral Health Resources in the Community: Intensive Outpatient Programs Organization         Address  Phone  Notes  [place]. [address] [place], [place] [phone]   [facility] Outpatient [address] [place], [place]   ADS: Alcohol & Drug Svcs [address], [place], [place]   [place] [address]  [place], [place] or [phone]   Substance Abuse Resources Organization         Address  Phone  Notes  Alcohol and Drug Services  [phone]   [place]  [phone]   [facility] [place]   Chinita Pester  [phone]   Residential & Outpatient Substance Abuse Program  [phone]   Psychological Services Organization         Address  Phone  Notes  [facility] [place]  [place]  [phone]   [place] [address], [place] or [phone]    Mobile Crisis Teams Organization         Address  Phone  Notes  [facility], Mobile Crisis Care Unit  [phone]   [facility] Psychotherapeutic Services  [address] [place], [place]   [place] [address] [place] [phone]    Self-Help/Support Groups Organization         Address  Phone             Notes  [place]. of [place] - variety of support groups  [place] Call for more information  Narcotics Anonymous (NA), Caring Services [address], [place]  2 meetings at this location   Brewing technologist  Notes  ASAP Residential Treatment [place],    [place]  [place]  [address] [place] [id number], [place], [place]   [place], [place] [phone] Admissions: 8am-3pm M-F  Incentives Substance [place] [address],    [place], [place] [phone]   [facility] [address] [place], [place], [place]   [facility] [address],  [place], [place]   [facility] - Intensive Outpatient [address] Kristeen [address], [place], [place]   [facility] ([place].) [place].,  [place], [place] [phone] or [phone]   [facility] Services (RTS) [address], [place], [place] Accepts Medicaid  [place] [address]  [place] [phone] Substance Abuse/Addiction Treatment   [facility] Organization         Address  Phone  Notes  [facility]  [phone]   Domenic Schwab, PhD [address] Arlis Porta [place], [place]   [phone] or [phone]   Seth Ward [place], [place] [phone]   [facility] [address], [place], [place] [phone] Insurance/Medicaid/sponsorship through [facility] and Families [address], [address] [place]                                    [place], [place] [phone] [place] [address], [place] [phone]    Dr. Adele Schilder  [phone]   [facility] Dept. 1) [address] [place] 2) [place] 3)  [place] [address], [place] [phone]  [phone]   [place] [phone] or [phone] (After Hours)

## 2014-03-25 NOTE — ED Provider Notes (Signed)
Medical screening examination/treatment/procedure(s) were performed by non-physician practitioner and as supervising physician I was immediately available for consultation/collaboration.   EKG Interpretation None       Threasa Beards, MD 03/25/14 1505

## 2014-03-31 DIAGNOSIS — N039 Chronic nephritic syndrome with unspecified morphologic changes: Secondary | ICD-10-CM | POA: Diagnosis not present

## 2014-03-31 DIAGNOSIS — N183 Chronic kidney disease, stage 3 unspecified: Secondary | ICD-10-CM | POA: Diagnosis not present

## 2014-03-31 DIAGNOSIS — D631 Anemia in chronic kidney disease: Secondary | ICD-10-CM | POA: Diagnosis not present

## 2014-03-31 DIAGNOSIS — N2581 Secondary hyperparathyroidism of renal origin: Secondary | ICD-10-CM | POA: Diagnosis not present

## 2014-04-01 ENCOUNTER — Encounter (HOSPITAL_COMMUNITY): Payer: Self-pay | Admitting: Emergency Medicine

## 2014-04-01 ENCOUNTER — Emergency Department (HOSPITAL_COMMUNITY)
Admission: EM | Admit: 2014-04-01 | Discharge: 2014-04-01 | Disposition: A | Discharge status: Home / Self Care | Payer: Medicare Other | Attending: Emergency Medicine | Admitting: Emergency Medicine

## 2014-04-01 DIAGNOSIS — N184 Chronic kidney disease, stage 4 (severe): Secondary | ICD-10-CM | POA: Insufficient documentation

## 2014-04-01 DIAGNOSIS — M109 Gout, unspecified: Secondary | ICD-10-CM | POA: Insufficient documentation

## 2014-04-01 DIAGNOSIS — Z8619 Personal history of other infectious and parasitic diseases: Secondary | ICD-10-CM | POA: Diagnosis not present

## 2014-04-01 DIAGNOSIS — Z79899 Other long term (current) drug therapy: Secondary | ICD-10-CM | POA: Diagnosis not present

## 2014-04-01 DIAGNOSIS — M129 Arthropathy, unspecified: Secondary | ICD-10-CM | POA: Diagnosis not present

## 2014-04-01 DIAGNOSIS — Z412 Encounter for routine and ritual male circumcision: Secondary | ICD-10-CM | POA: Diagnosis not present

## 2014-04-01 DIAGNOSIS — Z862 Personal history of diseases of the blood and blood-forming organs and certain disorders involving the immune mechanism: Secondary | ICD-10-CM | POA: Diagnosis not present

## 2014-04-01 DIAGNOSIS — I10 Essential (primary) hypertension: Secondary | ICD-10-CM | POA: Diagnosis not present

## 2014-04-01 DIAGNOSIS — Z8669 Personal history of other diseases of the nervous system and sense organs: Secondary | ICD-10-CM | POA: Diagnosis not present

## 2014-04-01 DIAGNOSIS — I129 Hypertensive chronic kidney disease with stage 1 through stage 4 chronic kidney disease, or unspecified chronic kidney disease: Secondary | ICD-10-CM | POA: Insufficient documentation

## 2014-04-01 DIAGNOSIS — F17200 Nicotine dependence, unspecified, uncomplicated: Secondary | ICD-10-CM | POA: Insufficient documentation

## 2014-04-01 DIAGNOSIS — Z8781 Personal history of (healed) traumatic fracture: Secondary | ICD-10-CM | POA: Diagnosis not present

## 2014-04-01 DIAGNOSIS — IMO0002 Reserved for concepts with insufficient information to code with codable children: Secondary | ICD-10-CM | POA: Diagnosis not present

## 2014-04-01 DIAGNOSIS — Z21 Asymptomatic human immunodeficiency virus [HIV] infection status: Secondary | ICD-10-CM | POA: Insufficient documentation

## 2014-04-01 DIAGNOSIS — Z8701 Personal history of pneumonia (recurrent): Secondary | ICD-10-CM | POA: Diagnosis not present

## 2014-04-01 MED ORDER — DEXAMETHASONE SODIUM PHOSPHATE 10 MG/ML IJ SOLN
10.0000 mg | Freq: Once | INTRAMUSCULAR | Status: AC
  Administered 2014-04-01: 10 mg via INTRAMUSCULAR
  Filled 2014-04-01: qty 1

## 2014-04-01 MED ORDER — NAPROXEN 500 MG PO TABS: 500.0000 mg | ORAL_TABLET | Freq: Two times a day (BID) | ORAL | Status: DC

## 2014-04-01 MED ORDER — KETOROLAC TROMETHAMINE 60 MG/2ML IM SOLN
60.0000 mg | Freq: Once | INTRAMUSCULAR | Status: AC
Start: 2014-04-01 — End: 2014-04-01
  Administered 2014-04-01: 60 mg via INTRAMUSCULAR
  Filled 2014-04-01: qty 2

## 2014-04-01 NOTE — ED Provider Notes (Signed)
Medical screening examination/treatment/procedure(s) were performed by non-physician practitioner and as supervising physician I was immediately available for consultation/collaboration.   EKG Interpretation None        Ezequiel Essex, MD 04/01/14 [id number]

## 2014-04-01 NOTE — Discharge Instructions (Signed)

## 2014-04-01 NOTE — ED Provider Notes (Signed)
CSN: [id number]     Arrival date & time 04/01/14  1233 History   First MD Initiated Contact with Patient 04/01/14 1258     Chief Complaint  Patient presents with  . Gout     (Consider location/radiation/quality/duration/timing/severity/associated sxs/prior Treatment) HPI Comments: Darrell Johnson is a(n) 48 y.o. male who presents to the ED with cc gout flare of R knee. He has a pmh of ckd, HIV, HTN and recurrent gout flares. He c/o heat, redness and pain the the R knee for the past two days (contradictory to nursing intake note). Patient c/o heat, redness, swelling, pain in r knee worse wit mvmt and ambulation. He states it is the same as his regular gout flares. He is compliant with his medications last 3 cd4 counts as listed below. Last viral load undetectable. Denies fevers, chills, myalgias, arthralgias. Denies DOE, SOB, chest tightness or pressure, radiation to left arm, jaw or back, or diaphoresis. Denies dysuria, flank pain, suprapubic pain, frequency, urgency, or hematuria. Denies headaches, light headedness, weakness, visual disturbances. Denies abdominal pain, nausea, vomiting, diarrhea or constipation.   CD4TABS      490   10/20/2013 CD4TABS      560   07/18/2013 CD4TABS      600   04/04/2013   The history is provided by the patient. No language interpreter was used.    Past Medical History  Diagnosis Date  . 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 Hypertension   . Seizures     last seizure was >5 years ago, pt has family history of seizures  . Syphilis 1997    history of syphilis 1997  . Chronic kidney disease     stage 3-4 CKD, followed by Dr. Moshe Cipro  . 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 Rib fractures 01/2009  . HIV infection 1980's    on ART therapy since, followed by  ID clinic, complicated  by neuropathy  . 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 Pneumonia   . Arthritis   . Anemia    Past Surgical History  Procedure Laterality Date  . Abscess drainage    . Av fistula placement Right 12/02/2013    Procedure: ARTERIOVENOUS (AV) FISTULA CREATION;  Surgeon: Rosetta Posner, MD;  [facility];  Service: Vascular;  Laterality: Right;   Family History  Problem Relation Age of Onset  . Cancer Mother   . COPD Father   . Diabetes Sister   . Hypertension Sister   . Diabetes Brother   . Hypertension Brother   . Stroke Neg Hx    History  Substance Use Topics  . Smoking status: Current Every Day Smoker -- 0.30 packs/day for 20 years    Types: Cigarettes  . Smokeless tobacco: Never Used     Comment: decreasing.  . Alcohol Use: Yes     Comment: Vodka sometimes.    Review of Systems  Musculoskeletal: Positive for arthralgias, gait problem and joint swelling.  All other systems reviewed and are negative.     Allergies  Review of patient's allergies indicates no known allergies.  Home Medications   Prior to Admission medications   Medication Sig Start Date End Date Taking? Authorizing Provider  allopurinol (ZYLOPRIM) 100 MG tablet Take 1 tablet (100 mg total) by mouth daily. 02/08/14  Dellia Nims, MD  carvedilol (COREG) 25 MG tablet Take 50 mg by mouth 2 (two) times daily with a meal.    Historical Provider, MD  Darunavir Ethanolate (PREZISTA) 800 MG tablet Take 1 tablet (800 mg total) by mouth daily with breakfast. 02/01/14   Carlyle Basques, MD  doxazosin (CARDURA) 8 MG tablet Take 8 mg by mouth at bedtime.    Historical Provider, MD  furosemide (LASIX) 40 MG tablet Take 40-80 mg by mouth 2 (two) times daily. Take 80 mg every morning and 40 mg every evening.    Historical Provider, MD  lamivudine (EPIVIR) 100 MG tablet Take 1 tablet (100 mg total) by mouth daily. 02/01/14   Carlyle Basques, MD  montelukast (SINGULAIR) 10 MG tablet Take 10 mg by mouth 2 (two)  times daily. 11/03/13   Carlyle Basques, MD  oxyCODONE (ROXICODONE) 5 MG immediate release tablet Take 1 tablet (5 mg total) by mouth every 6 (six) hours as needed for severe pain. 03/24/14   Marissa Sciacca, PA-C  predniSONE (DELTASONE) 20 MG tablet 3 tabs po day one, then 2 tabs daily x 4 days 03/24/14   Marissa Sciacca, PA-C  raltegravir (ISENTRESS) 400 MG tablet Take 1 tablet (400 mg total) by mouth 2 (two) times daily. 02/01/14   Carlyle Basques, MD  ritonavir (NORVIR) 100 MG TABS tablet Take 1 tablet (100 mg total) by mouth daily. 02/01/14   Carlyle Basques, MD  zidovudine (RETROVIR) 300 MG tablet Take 1 tablet (300 mg total) by mouth 2 (two) times daily. 02/01/14   Carlyle Basques, MD   BP 184/85  Pulse 104  Temp(Src) 98.6 F (37 C) (Oral)  Resp 18  SpO2 98% Physical Exam  Nursing note and vitals reviewed. Constitutional: He is oriented to person, place, and time. He appears well-developed and well-nourished. No distress.  HENT:  Head: Normocephalic and atraumatic.  Eyes: Conjunctivae are normal. No scleral icterus.  Neck: Normal range of motion. Neck supple.  Cardiovascular: Normal rate, regular rhythm and normal heart sounds.   Pulmonary/Chest: Effort normal and breath sounds normal. No respiratory distress.  Abdominal: Soft. There is no tenderness.  Musculoskeletal: He exhibits edema and tenderness.  R knee with warmth, mild erythema, ttp. ROM limited due to pain however he is able to move the knee.  Neurological: He is alert and oriented to person, place, and time.  Skin: Skin is warm and dry. He is not diaphoretic.  Psychiatric: His behavior is normal.    ED Course  Procedures (including critical care time) Labs Review Labs Reviewed - No data to display  Imaging Review No results found.   EKG Interpretation None      MDM   Final diagnoses:  Acute gout of right knee, unspecified cause   Decadron and toradol IM here.  He states that this is no different form any other  flare. Pt presents with monoarticular pain, swelling and erythema.  Pt is afebrile and stable Pt without known peptic ulcer disease and not receiving concurrent treatment on warfarin. Pt dc with naproxen 500 bid Discussed that pt should respond to treatment with in 24 hour of begining treatment & likely resolve in 2-3 days.   The nature of gout is fully explained, including dietary relationship, acute and interval phase and treatment of both. Long term complications such as kidney stones, tophi and arthritis are discussed. Avoidance of alcohol recommended, and written literature is given along with a low purine diet. Indications for the use of allopurinol for prophylaxis and  the use of NSAIDS to treat flare-ups is also discussed.Margarita Mail, PA-C 04/01/14 1345

## 2014-04-01 NOTE — ED Notes (Signed)
Patient presents to the ED with C/O pain right knee for 1 week he advised that the swelling got worse yesterday and the pain increased.

## 2014-04-01 NOTE — ED Notes (Signed)
Patient discharged using the teach back method he verbalizes an understanding D/C via W/C

## 2014-04-01 NOTE — ED Notes (Signed)
Edema noted to the right knee and ankle area. Denies injury, history of gout

## 2014-04-04 DIAGNOSIS — D631 Anemia in chronic kidney disease: Secondary | ICD-10-CM | POA: Diagnosis not present

## 2014-04-04 DIAGNOSIS — N184 Chronic kidney disease, stage 4 (severe): Secondary | ICD-10-CM | POA: Diagnosis not present

## 2014-04-04 DIAGNOSIS — Z23 Encounter for immunization: Secondary | ICD-10-CM | POA: Diagnosis not present

## 2014-04-04 DIAGNOSIS — I129 Hypertensive chronic kidney disease with stage 1 through stage 4 chronic kidney disease, or unspecified chronic kidney disease: Secondary | ICD-10-CM | POA: Diagnosis not present

## 2014-04-04 DIAGNOSIS — N2581 Secondary hyperparathyroidism of renal origin: Secondary | ICD-10-CM | POA: Diagnosis not present

## 2014-04-04 DIAGNOSIS — N039 Chronic nephritic syndrome with unspecified morphologic changes: Secondary | ICD-10-CM | POA: Diagnosis not present

## 2014-04-06 ENCOUNTER — Encounter: Payer: Self-pay | Admitting: Internal Medicine

## 2014-04-06 ENCOUNTER — Ambulatory Visit (INDEPENDENT_AMBULATORY_CARE_PROVIDER_SITE_OTHER): Payer: Medicare Other | Admitting: Internal Medicine

## 2014-04-06 VITALS — BP 159/72 | HR 86 | Temp 98.1°F | Wt 262.8 lb

## 2014-04-06 DIAGNOSIS — M109 Gout, unspecified: Secondary | ICD-10-CM | POA: Diagnosis not present

## 2014-04-06 DIAGNOSIS — I10 Essential (primary) hypertension: Secondary | ICD-10-CM | POA: Diagnosis not present

## 2014-04-06 MED ORDER — HYDROCODONE-ACETAMINOPHEN 10-325 MG PO TABS: 1.0000 | ORAL_TABLET | Freq: Three times a day (TID) | ORAL | Status: DC | PRN

## 2014-04-06 NOTE — Assessment & Plan Note (Signed)
BP remains elevated. Filed Vitals:   04/06/14 1404  BP: 159/72  Pulse: 86  Temp:   missed coreg last few days and patient is in pain. Continue curreng regimen coreg 50mg  BID, lasix 80mg  BID.

## 2014-04-06 NOTE — Progress Notes (Signed)
   Subjective:    Patient ID: Darrell Johnson, male    DOB: 1966-04-03, 48 y.o.   MRN: [id number]  Knee Pain  Pertinent negatives include no numbness.   48 yo male with HIV CD-4 490. , HTN, peripheral neuropathy, chronic recurrent gout, chronic pain here for medication refill.   Recently seen in ED for gout flare. Had pain and swelling on right knee and pain radiating down. Feeling somewhat better now. Leg swelling improving, lasix helping. Dc/ed with naproxen along with allopurinol. Wants to see rheum Dr. Luan Pulling who saw him in the past and controlled his gout well.  Has legs swelling but improving per patient with lasix. 176/89 BP today, he is in pain. BP checks at home ~ 140s sBP. Didn't take coreg for last few day as he ran out.   Review of Systems  Constitutional: Negative for fever, chills, activity change, appetite change and fatigue.  HENT: Negative for congestion, drooling, ear discharge, ear pain, nosebleeds, postnasal drip, rhinorrhea, sinus pressure and sore throat.   Eyes: Negative for pain, discharge and visual disturbance.  Respiratory: Negative for cough, chest tightness, shortness of breath and wheezing.   Cardiovascular: Positive for leg swelling. Negative for chest pain and palpitations.  Gastrointestinal: Negative for nausea, diarrhea, constipation, abdominal distention and anal bleeding.  Genitourinary: Negative for dysuria, urgency, hematuria, decreased urine volume and difficulty urinating.  Musculoskeletal: Positive for arthralgias and joint swelling. Negative for back pain, gait problem, myalgias, neck pain and neck stiffness.  Skin: Negative.   Allergic/Immunologic: Negative.   Neurological: Negative for dizziness, seizures, syncope, speech difficulty, weakness, light-headedness, numbness and headaches.  Hematological: Negative.   Psychiatric/Behavioral: Negative.        Objective:   Physical Exam  Constitutional: He is oriented to person, place, and time.  He appears well-developed and well-nourished. No distress.  HENT:  Head: Normocephalic and atraumatic.  Right Ear: External ear normal.  Left Ear: External ear normal.  Nose: Nose normal.  Mouth/Throat: Oropharynx is clear and moist.  Eyes: Conjunctivae and EOM are normal. Pupils are equal, round, and reactive to light. Right eye exhibits no discharge. Left eye exhibits no discharge. No scleral icterus.  Neck: Normal range of motion. Neck supple. No JVD present. No thyromegaly present.  Cardiovascular: Normal rate, regular rhythm, S1 normal, S2 normal, normal heart sounds and intact distal pulses.  Exam reveals no gallop and no friction rub.   No murmur heard. Pulmonary/Chest: Effort normal and breath sounds normal. No respiratory distress. He has no wheezes. He has no rales. He exhibits no tenderness.  Abdominal: Soft. Bowel sounds are normal. He exhibits no distension and no mass. There is no tenderness. There is no rebound and no guarding.  Musculoskeletal: Normal range of motion. He exhibits no edema and no tenderness.  1+ pitting edema BLE's upto shin. No TTP to palpation, no erythema, no swelling.   Lymphadenopathy:    He has no cervical adenopathy.  Neurological: He is alert and oriented to person, place, and time. He has normal strength and normal reflexes. No cranial nerve deficit or sensory deficit.  Skin: No rash noted. He is not diaphoretic. No erythema. No pallor.  Psychiatric: He has a normal mood and affect. His behavior is normal.          Assessment & Plan:  See problem based a&p.

## 2014-04-06 NOTE — Assessment & Plan Note (Signed)
Has chronic recurrence. Went to ED recently. On allopurinol and naproxen currently. Cannot afford uroloic.  Will refer to previous rheumatologist Dr. Standley Dakins per patient's request.   Filled norco 10-325 q8hr PRN #90 for 1 month.

## 2014-04-06 NOTE — Patient Instructions (Signed)
Continue to take naproxen and allopurinol for your gout. Follow up with rheumatologist for gout. Filled your norco for 1 month. Follow up in 1 month to see if you still need norco as we expect your gout pain to improve.  Continue to take coreg. Your BP was high today. We will evaluate again next visit.  General Instructions:   Please bring your medicines with you each time you come to clinic.  Medicines may include prescription medications, over-the-counter medications, herbal remedies, eye drops, vitamins, or other pills.   Progress Toward Treatment Goals:  Treatment Goal 11/10/2013  Blood pressure unchanged  Stop smoking smoking the same amount    Self Care Goals & Plans:  Self Care Goal 01/13/2014  Manage my medications take my medicines as prescribed; bring my medications to every visit; refill my medications on time  Monitor my health -  Eat healthy foods -  Be physically active -  Stop smoking -    No flowsheet data found.   Care Management & Community Referrals:  Referral 06/15/2013  Referrals made for care management support none needed

## 2014-04-07 NOTE — Progress Notes (Signed)
INTERNAL MEDICINE TEACHING ATTENDING ADDENDUM - Aldine Contes, MD: I personally saw and evaluated Mr. Degroot in this clinic visit in conjunction with the resident, Dr. Genene Churn. I have discussed patient's plan of care with medical resident during this visit. I have confirmed the physical exam findings and have read and agree with the clinic note including the plan with the following addition: - Pt referred to rheumatology for recurrent gout - I am uncertain if pt requires long term pain medications from this clinic. We will reassess next month and determine if needs to be refilled - BP milldy elevated but pt has run out of Coreg. Would maintain current regimen for now

## 2014-04-10 ENCOUNTER — Encounter (HOSPITAL_COMMUNITY)
Admission: RE | Admit: 2014-04-10 | Discharge: 2014-04-10 | Disposition: A | Discharge status: Home / Self Care | Payer: Medicare Other | Source: Ambulatory Visit | Attending: Nephrology | Admitting: Nephrology

## 2014-04-10 DIAGNOSIS — N184 Chronic kidney disease, stage 4 (severe): Secondary | ICD-10-CM | POA: Insufficient documentation

## 2014-04-10 DIAGNOSIS — D631 Anemia in chronic kidney disease: Secondary | ICD-10-CM | POA: Diagnosis not present

## 2014-04-10 DIAGNOSIS — N039 Chronic nephritic syndrome with unspecified morphologic changes: Principal | ICD-10-CM

## 2014-04-10 LAB — POCT HEMOGLOBIN-HEMACUE: Hemoglobin: 7.5 g/dL — ABNORMAL LOW (ref 13.0–17.0)

## 2014-04-10 MED ORDER — EPOETIN ALFA 20000 UNIT/ML IJ SOLN
INTRAMUSCULAR | Status: AC
  Filled 2014-04-10: qty 1

## 2014-04-10 MED ORDER — EPOETIN ALFA 20000 UNIT/ML IJ SOLN
20000.0000 [IU] | INTRAMUSCULAR | Status: DC
  Administered 2014-04-10: 20000 [IU] via SUBCUTANEOUS

## 2014-04-10 NOTE — Discharge Instructions (Signed)

## 2014-04-10 NOTE — Progress Notes (Signed)
HGB via hemocue 7.5. Called [facility] office and reported to [facility]. No new orders.

## 2014-04-12 ENCOUNTER — Other Ambulatory Visit: Payer: Self-pay | Admitting: *Deleted

## 2014-04-12 DIAGNOSIS — I10 Essential (primary) hypertension: Secondary | ICD-10-CM

## 2014-04-12 MED ORDER — CARVEDILOL 25 MG PO TABS: 50.0000 mg | ORAL_TABLET | Freq: Two times a day (BID) | ORAL | Status: DC

## 2014-04-13 ENCOUNTER — Encounter: Payer: Self-pay | Admitting: Internal Medicine

## 2014-04-13 ENCOUNTER — Ambulatory Visit (INDEPENDENT_AMBULATORY_CARE_PROVIDER_SITE_OTHER): Payer: Medicare Other | Admitting: Internal Medicine

## 2014-04-13 VITALS — BP 149/70 | HR 88 | Temp 98.2°F | Ht 70.0 in | Wt 270.3 lb

## 2014-04-13 DIAGNOSIS — N186 End stage renal disease: Secondary | ICD-10-CM | POA: Diagnosis not present

## 2014-04-13 NOTE — Progress Notes (Signed)
Medicine attending: Medical history, presenting problems, physical findings, and medications, reviewed with resident physician Dr. Denton Brick and I concur with her evaluation and management plan. This patient has marked scrotal edema which is related to anasarca from fluid retention associated with progressive renal dysfunction. We will defer a decision on aggressive diuretics to his nephrologist. He was seen recently by nephrology and a vascular access graft is planned in anticipation of dialysis soon. Murriel Hopper, M.D., [place]

## 2014-04-13 NOTE — Progress Notes (Signed)
Patient ID: Darrell Johnson, male   DOB: 10/18/1965, 48 y.o.   MRN: [id number]   Subjective:   Patient ID: Darrell Johnson male   DOB: 06/09/1966 48 y.o.   MRN: [id number]  HPI: [name] is a 48 y.o. with PMH listed below. Presented today with c/o swelling in his testicles- both are swollen, no ulcers or wounds. Has happened previously- ~ 6 yrs ago, and then he had a circumcison. Pts thinks he might be having pain if he wasn't taken his pain pills, but so far no pain. No SOB, has leg swelling that he says is improving. No fever. No change in size of abdomen. No cough, no congestion, no body aches or sore throat. No change in colour of scrotum and no discharge.   Past Medical History  Diagnosis Date  . 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 Hypertension   . Seizures     last seizure was >5 years ago, pt has family history of seizures  . Syphilis 1997    history of syphilis 1997  . Chronic kidney disease     stage 3-4 CKD, followed by Dr. Moshe Cipro  . 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 Rib fractures 01/2009  . HIV infection 1980's    on ART therapy since, followed by ID clinic, complicated  by neuropathy  . 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 Pneumonia   . Arthritis   . Anemia    Current Outpatient Prescriptions  Medication Sig Dispense Refill  . allopurinol (ZYLOPRIM) 100 MG tablet Take 1 tablet (100 mg total) by mouth daily.  30 tablet  3  . carvedilol (COREG) 25 MG tablet Take 2 tablets (50 mg total) by mouth 2 (two) times daily with a meal.  120 tablet  11  . Darunavir Ethanolate (PREZISTA) 800 MG tablet Take 1 tablet (800 mg total) by mouth daily with breakfast.  30 tablet  5  . doxazosin (CARDURA) 8 MG tablet Take 8 mg by mouth at bedtime.      . furosemide (LASIX) 40 MG  tablet Take 40-80 mg by mouth 2 (two) times daily. Take 80 mg every morning and 40 mg every evening.      Marland Kitchen HYDROcodone-acetaminophen (NORCO) 10-325 MG per tablet Take 1 tablet by mouth every 8 (eight) hours as needed.  90 tablet  0  . lamivudine (EPIVIR) 100 MG tablet Take 1 tablet (100 mg total) by mouth daily.  30 tablet  5  . montelukast (SINGULAIR) 10 MG tablet Take 10 mg by mouth 2 (two) times daily.      . naproxen (NAPROSYN) 500 MG tablet Take 1 tablet (500 mg total) by mouth 2 (two) times daily.  30 tablet  0  . oxyCODONE (ROXICODONE) 5 MG immediate release tablet Take 1 tablet (5 mg total) by mouth every 6 (six) hours as needed for severe pain.  5 tablet  0  . predniSONE (DELTASONE) 20 MG tablet 3 tabs po day one, then 2 tabs daily x 4 days  11 tablet  0  . raltegravir (ISENTRESS) 400 MG tablet Take 1 tablet (400 mg total) by mouth 2 (two) times daily.  60 tablet  5  . ritonavir (NORVIR) 100 MG TABS tablet Take 1 tablet (100 mg total) by mouth daily.  30 tablet  5  . zidovudine (RETROVIR) 300 MG tablet Take 1 tablet (300 mg total) by mouth 2 (two) times daily.  60 tablet  5   No current facility-administered medications for this visit.   Family History  Problem Relation Age of Onset  . Cancer Mother   . COPD Father   . Diabetes Sister   . Hypertension Sister   . Diabetes Brother   . Hypertension Brother   . Stroke Neg Hx    History   Social History  . Marital Status: Married    Spouse Name: N/A    Number of Children: N/A  . Years of Education: N/A   Social History Main Topics  . Smoking status: Current Every Day Smoker -- 0.25 packs/day for 20 years    Types: Cigarettes  . Smokeless tobacco: Never Used     Comment: decreasing.  . Alcohol Use: Yes     Comment: Vodka sometimes.  . Drug Use: No  . Sexual Activity: None     Comment: given condoms   Other Topics Concern  . None   Social History Narrative  . None   Review of Systems: CONSTITUTIONAL- No Fever,  weightloss, night sweat or change in appetite. SKIN- No Rash, colour changes or itching. HEAD- No Headache or dizziness. EYES- No Vision loss, pain, redness, double or blurred vision. RESPIRATORY- No Cough or SOB. CARDIAC- No Palpitations, DOE, PND or chest pain. GI- No nausea, vomiting, diarrhoea, constipation, abd pain. URINARY- No Frequency, urgency, straining or dysuria. NEUROLOGIC- No Numbness, syncope, seizures or burning. [facility]- Denies depression or anxiety.  Objective:  Physical Exam: Filed Vitals:   04/13/14 1423  BP: 149/70  Pulse: 88  Temp: 98.2 F (36.8 C)  TempSrc: Oral  Height: 5\' 10"  (1.778 m)  Weight: 270 lb 4.8 oz (122.607 kg)  SpO2: 99%   GENERAL- alert, co-operative, appears as stated age, not in any distress. HEENT- Atraumatic, normocephalic, PERRL, neck supple. CARDIAC- RRR, no murmurs, rubs or gallops. RESP- Moving equal volumes of air, and clear to auscultation bilaterally, no wheezes or crackles. ABDOMEN- Soft, nontender, tense and full, no guarding or rebound, no palpable masses or organomegaly, bowel sounds present. BACK- Normal curvature of the spine, No tenderness along the vertebrae, no CVA tenderness. NEURO- No obvious Cr N abnormality, strenght upper and lower extremities- intact, Gait- Normal. Genital- Massive swelling of scrotum bilaterally, not tender to palpation, normal skin colour. EXTREMITIES- Significant +3 pitting pedal edema, to the knees. SKIN- Warm, dry, No rash or lesion. PSYCH- Normal mood and affect, appropriate thought content and speech.  Assessment & Plan:  The patient's case and plan of care was discussed with attending physician, Dr. Beryle Beams.  Please see problem based charting for assessment and plan.

## 2014-04-13 NOTE — Patient Instructions (Signed)
General Instructions: We want you to see your kidney doctor, to avoid repeating lab tests and since we are not sure of what medication you are taking. When we have scheduled your appointment, you will be contacted.  Please take all your pill bottles along, to prevent mistakes.   Please bring your medicines with you each time you come to clinic.  Medicines may include prescription medications, over-the-counter medications, herbal remedies, eye drops, vitamins, or other pills.

## 2014-04-13 NOTE — Assessment & Plan Note (Signed)
Patient appears volume overloaded. Pt has gained significant weight over the past 2 months- today - 27 pounds. Pt is not short of breath but has significant +3 pitting pedal edema to the knee, and massive scrotal swelling. Last Bmet - May 2015- GFR- 10, Cr- 7.06.  Plan- Patient is not sure of what medication he is taking, brought two different pills which he put in a zip lock- checked on the internet, these pills were- Coreg- 25mg  and lasix 40mg . Per chart he is supposed to be on 80mg  lasix at night and 40mg  in the evening, but patient keeps saying his dose of lasix is 25mg . Says that his medications were just adjusted. Last saw his nephrologist- Dr Clover Mealy- 04/04/2014. Called office and tried to set up appointment for patient- likely early next week, left 2 voice [facility].

## 2014-04-17 DIAGNOSIS — N184 Chronic kidney disease, stage 4 (severe): Secondary | ICD-10-CM | POA: Diagnosis not present

## 2014-04-17 DIAGNOSIS — N2581 Secondary hyperparathyroidism of renal origin: Secondary | ICD-10-CM | POA: Diagnosis not present

## 2014-04-17 DIAGNOSIS — D631 Anemia in chronic kidney disease: Secondary | ICD-10-CM | POA: Diagnosis not present

## 2014-04-17 DIAGNOSIS — I129 Hypertensive chronic kidney disease with stage 1 through stage 4 chronic kidney disease, or unspecified chronic kidney disease: Secondary | ICD-10-CM | POA: Diagnosis not present

## 2014-04-17 DIAGNOSIS — N039 Chronic nephritic syndrome with unspecified morphologic changes: Secondary | ICD-10-CM | POA: Diagnosis not present

## 2014-04-24 ENCOUNTER — Encounter (HOSPITAL_COMMUNITY): Payer: Medicare Other

## 2014-04-25 DIAGNOSIS — M1000 Idiopathic gout, unspecified site: Secondary | ICD-10-CM | POA: Diagnosis not present

## 2014-04-25 DIAGNOSIS — Z79899 Other long term (current) drug therapy: Secondary | ICD-10-CM | POA: Diagnosis not present

## 2014-04-25 DIAGNOSIS — M25561 Pain in right knee: Secondary | ICD-10-CM | POA: Diagnosis not present

## 2014-04-25 DIAGNOSIS — M2550 Pain in unspecified joint: Secondary | ICD-10-CM | POA: Diagnosis not present

## 2014-05-01 ENCOUNTER — Encounter: Payer: Self-pay | Admitting: Internal Medicine

## 2014-05-01 ENCOUNTER — Ambulatory Visit (INDEPENDENT_AMBULATORY_CARE_PROVIDER_SITE_OTHER): Payer: Medicare Other | Admitting: Internal Medicine

## 2014-05-01 VITALS — BP 139/113 | HR 80 | Temp 98.2°F | Ht 70.0 in | Wt 232.9 lb

## 2014-05-01 DIAGNOSIS — N186 End stage renal disease: Secondary | ICD-10-CM

## 2014-05-01 DIAGNOSIS — M1A00X0 Idiopathic chronic gout, unspecified site, without tophus (tophi): Secondary | ICD-10-CM | POA: Diagnosis not present

## 2014-05-01 DIAGNOSIS — I10 Essential (primary) hypertension: Secondary | ICD-10-CM | POA: Diagnosis not present

## 2014-05-01 DIAGNOSIS — Z720 Tobacco use: Secondary | ICD-10-CM

## 2014-05-01 LAB — BASIC METABOLIC PANEL WITH GFR
BUN: 184 mg/dL — ABNORMAL HIGH (ref 6–23)
CALCIUM: 8.7 mg/dL (ref 8.4–10.5)
CO2: 18 mEq/L — ABNORMAL LOW (ref 19–32)
Chloride: 95 mEq/L — ABNORMAL LOW (ref 96–112)
Creat: 8.2 mg/dL — ABNORMAL HIGH (ref 0.50–1.35)
GFR, EST AFRICAN AMERICAN: 8 mL/min — AB
GFR, Est Non African American: 7 mL/min — ABNORMAL LOW
GLUCOSE: 116 mg/dL — AB (ref 70–99)
Potassium: 4.8 mEq/L (ref 3.5–5.3)
Sodium: 133 mEq/L — ABNORMAL LOW (ref 135–145)

## 2014-05-01 MED ORDER — NICOTINE 14 MG/24HR TD PT24: 14.0000 mg | MEDICATED_PATCH | TRANSDERMAL | Status: DC

## 2014-05-01 MED ORDER — NICOTINE 7 MG/24HR TD PT24: 7.0000 mg | MEDICATED_PATCH | TRANSDERMAL | Status: DC

## 2014-05-01 NOTE — Assessment & Plan Note (Signed)
Currently smoking ~5-6 cigarettes a day. Wants to quit but cannot afford OTC nicotine patches. I have wrote the prescription for it (14mg  patches for 6 weeks and then 7 mg for 2 weeks). Gave instructions to the patient about how to use it.

## 2014-05-01 NOTE — Assessment & Plan Note (Signed)
He was volume overloaded last visit with scrotal edema and BLE edema +3. Now he appears to be euvolemic with the increased lasix dose of 120mg  BID. He is having good urine output per patient.  Saw dr. Clover Mealy recently who increased the lasix to 120mg  BID and will follow up again with her on November 3rd, 2015.   Will check BMP today to monitor electrolytes and kidney function with high dose lasix. Might need to change it based on the lab findings.

## 2014-05-01 NOTE — Patient Instructions (Addendum)
We will check some labs. Your blood pressure was high today. You should watch your diet.   Smokers with ?10 cigarettes per day are advised to begin with the 14 mg/day strength for six weeks, followed by 7 mg/day for two weeks.  To use the nicotine patch, the smoker applies one patch each morning to any non-hairy skin site. It is removed and replaced with a new patch the next morning. The patch site should be rotated daily to avoid skin irritation, which is the most common side effect.  Insomnia and vivid dreams are frequently reported when the patch is left on overnight. These can be minimized by removing the patch at bedtime.  Don't smoke while you are using the patch!  General Instructions:   Please bring your medicines with you each time you come to clinic.  Medicines may include prescription medications, over-the-counter medications, herbal remedies, eye drops, vitamins, or other pills.   Progress Toward Treatment Goals:  Treatment Goal 11/10/2013  Blood pressure unchanged  Stop smoking smoking the same amount    Self Care Goals & Plans:  Self Care Goal 04/13/2014  Manage my medications bring my medications to every visit; refill my medications on time; take my medicines as prescribed  Monitor my health -  Eat healthy foods eat foods that are low in salt; eat more vegetables; eat baked foods instead of fried foods  Be physically active find an activity I enjoy  Stop smoking set a quit date and stop smoking; go to the [employer] (https://scott-book[employer].info/)    No flowsheet data found.   Care Management & Community Referrals:  Referral 06/15/2013  Referrals made for care management support none needed

## 2014-05-01 NOTE — Progress Notes (Signed)
Subjective:    Patient ID: Darrell Johnson, male    DOB: [birth year], 48 y.o.   MRN: [id number]  Hypertension Pertinent negatives include no chest pain, headaches, neck pain, palpitations or shortness of breath.    48 yo male with EDRD not on HD (waiting for tx from daughter), HTN, HIV well controlled, chronic gout here for follow up for HTN.  He was recently seen by Dr. Denton Brick on 04/13/14 with some b/l scrotal swelling and 3+ BLE edema, thought to be volume overloaded. He wasn't sure what lasix dose he was supposed to be on. He was sent to see his nephrologist. He saw the nephrologist and was put on 120mg  lasix BID. His scrotal swelling and BLE swelling both resolved. He has f/up appt with nephrologist on November 3rd, 15.   He also saw rheumatologist Dr. Luan Pulling recently who took out some fluid from his right knee and also put him on a medication for his gout. He is not sure which one. Patient will give [place] the name so we can have it in the system next time.   Review of Systems  Constitutional: Negative for fever, chills, activity change, appetite change and fatigue.  HENT: Negative for congestion, drooling, ear discharge, ear pain, nosebleeds, postnasal drip, rhinorrhea, sinus pressure and sore throat.   Eyes: Negative for pain, discharge and visual disturbance.  Respiratory: Negative for cough, chest tightness, shortness of breath and wheezing.   Cardiovascular: Negative for chest pain, palpitations and leg swelling.  Gastrointestinal: Negative for nausea, diarrhea, constipation, abdominal distention and anal bleeding.  Genitourinary: Negative for dysuria, urgency, hematuria, decreased urine volume and difficulty urinating.  Musculoskeletal: Negative for arthralgias, back pain, joint swelling, neck pain and neck stiffness.  Skin: Negative.   Allergic/Immunologic: Negative.   Neurological: Negative for dizziness, seizures, syncope, speech difficulty, weakness, light-headedness, numbness  and headaches.  Hematological: Negative.   Psychiatric/Behavioral: Negative.        Objective:   Physical Exam  Constitutional: He is oriented to person, place, and time. He appears well-developed and well-nourished. No distress.  HENT:  Head: Normocephalic and atraumatic.  Right Ear: External ear normal.  Left Ear: External ear normal.  Nose: Nose normal.  Mouth/Throat: Oropharynx is clear and moist.  Eyes: Conjunctivae and EOM are normal. Pupils are equal, round, and reactive to light. Right eye exhibits no discharge. Left eye exhibits no discharge. No scleral icterus.  Neck: Normal range of motion. Neck supple. No JVD present. No thyromegaly present.  Cardiovascular: Normal rate, regular rhythm, S1 normal, S2 normal, normal heart sounds and intact distal pulses.  Exam reveals no gallop and no friction rub.   No murmur heard. Pulmonary/Chest: Effort normal and breath sounds normal. No respiratory distress. He has no wheezes. He has no rales. He exhibits no tenderness.  Abdominal: Soft. Bowel sounds are normal. He exhibits no distension and no mass. There is no tenderness. There is no rebound and no guarding.  Genitourinary:  Didn't examine for scrotal edema per patient's preference but he states it has resolved.   Musculoskeletal: Normal range of motion. He exhibits no edema and no tenderness.  No lower extremity edema currently visible.    Lymphadenopathy:    He has no cervical adenopathy.  Neurological: He is alert and oriented to person, place, and time. He has normal strength and normal reflexes. No cranial nerve deficit or sensory deficit.  Skin: No rash noted. He is not diaphoretic. No erythema. No pallor.  Psychiatric: He has a normal  mood and affect. His behavior is normal.        Assessment & Plan:  See problem based a&p.

## 2014-05-01 NOTE — Assessment & Plan Note (Signed)
Filed Vitals:   05/01/14 1356  BP: 139/113  Pulse: 80  Temp: 98.2 F (36.8 C)   SBP under control but DBP elevated. Had some dietary indiscretion recenlty and has been stressed out with moving. Will not change meds for now.

## 2014-05-01 NOTE — Assessment & Plan Note (Signed)
Saw Dr. Trudie Reed on 04/25/2014. Allopurinol was discontinued because of the renal failure. His right knee was aspirated as it was warm with erythema and effusion. He was injected 40mg  kenalog for pain relief. He was given short supply of hydrocodone for temporary relief until steroid takes effect. He was told to re-start urolic 80mg  daily as swell as prednisone 10mg  daily for 1 week then 5 mg daily for 2 weeks. He was also asked to take colchicine every 3 days instead of daily to avoid renal damage.

## 2014-05-02 NOTE — Progress Notes (Signed)
Internal Medicine Clinic Attending  I saw and evaluated the patient.  I personally confirmed the key portions of the history and exam documented by Dr. Ahmed and I reviewed pertinent patient test results.  The assessment, diagnosis, and plan were formulated together and I agree with the documentation in the resident's note.   

## 2014-05-08 ENCOUNTER — Ambulatory Visit: Payer: Medicare Other | Admitting: Internal Medicine

## 2014-05-09 ENCOUNTER — Other Ambulatory Visit: Payer: Self-pay | Admitting: *Deleted

## 2014-05-09 ENCOUNTER — Other Ambulatory Visit: Payer: Self-pay | Admitting: Internal Medicine

## 2014-05-09 DIAGNOSIS — B20 Human immunodeficiency virus [HIV] disease: Secondary | ICD-10-CM

## 2014-05-09 MED ORDER — RALTEGRAVIR POTASSIUM 400 MG PO TABS: 400.0000 mg | ORAL_TABLET | Freq: Two times a day (BID) | ORAL | Status: DC

## 2014-05-09 MED ORDER — RITONAVIR 100 MG PO TABS: 100.0000 mg | ORAL_TABLET | Freq: Every day | ORAL | Status: DC

## 2014-05-09 MED ORDER — ZIDOVUDINE 300 MG PO TABS: 300.0000 mg | ORAL_TABLET | Freq: Two times a day (BID) | ORAL | Status: DC

## 2014-05-09 MED ORDER — DARUNAVIR ETHANOLATE 800 MG PO TABS: 800.0000 mg | ORAL_TABLET | Freq: Every day | ORAL | Status: DC

## 2014-05-09 MED ORDER — LAMIVUDINE 100 MG PO TABS: 100.0000 mg | ORAL_TABLET | Freq: Every day | ORAL | Status: DC

## 2014-05-10 DIAGNOSIS — N184 Chronic kidney disease, stage 4 (severe): Secondary | ICD-10-CM | POA: Diagnosis not present

## 2014-05-10 DIAGNOSIS — I129 Hypertensive chronic kidney disease with stage 1 through stage 4 chronic kidney disease, or unspecified chronic kidney disease: Secondary | ICD-10-CM | POA: Diagnosis not present

## 2014-05-10 DIAGNOSIS — D631 Anemia in chronic kidney disease: Secondary | ICD-10-CM | POA: Diagnosis not present

## 2014-05-10 DIAGNOSIS — N2581 Secondary hyperparathyroidism of renal origin: Secondary | ICD-10-CM | POA: Diagnosis not present

## 2014-05-11 ENCOUNTER — Encounter: Payer: Self-pay | Admitting: Internal Medicine

## 2014-05-11 ENCOUNTER — Ambulatory Visit (INDEPENDENT_AMBULATORY_CARE_PROVIDER_SITE_OTHER): Payer: Medicare Other | Admitting: Internal Medicine

## 2014-05-11 VITALS — BP 153/77 | HR 88 | Temp 97.2°F | Wt 227.0 lb

## 2014-05-11 DIAGNOSIS — M1000 Idiopathic gout, unspecified site: Secondary | ICD-10-CM

## 2014-05-11 DIAGNOSIS — N184 Chronic kidney disease, stage 4 (severe): Secondary | ICD-10-CM

## 2014-05-11 DIAGNOSIS — B20 Human immunodeficiency virus [HIV] disease: Secondary | ICD-10-CM | POA: Diagnosis not present

## 2014-05-11 MED ORDER — HYDROCODONE-ACETAMINOPHEN 10-325 MG PO TABS: 1.0000 | ORAL_TABLET | Freq: Three times a day (TID) | ORAL | Status: DC | PRN

## 2014-05-11 NOTE — Progress Notes (Signed)
Patient ID: Darrell Johnson, male   DOB: 02/24/1966, 48 y.o.   MRN: [id number]       Patient ID: Darrell Johnson, male   DOB: 1965/10/13, 48 y.o.   MRN: [id number]  HPI Darrell Johnson is a 48yo M is on salvage therapyRLG/DRVr/ZDV/lamivudine. CKD being evaluated for kidney transplant at [facility] with [facility]. Recently treated with gout with pred taper and kenalog injection roughly 2-3 wks ago. He noticed in the last week starting to have pain to 2nd finger, painful like gout. Warm and erythema to the area  Outpatient Encounter Prescriptions as of 05/11/2014  Medication Sig  . carvedilol (COREG) 25 MG tablet Take 2 tablets (50 mg total) by mouth 2 (two) times daily with a meal.  . Darunavir Ethanolate (PREZISTA) 800 MG tablet Take 1 tablet (800 mg total) by mouth daily with breakfast.  . doxazosin (CARDURA) 8 MG tablet Take 8 mg by mouth at bedtime.  . Febuxostat (ULORIC) 80 MG TABS Take 80 mg by mouth daily.  . furosemide (LASIX) 40 MG tablet Take 120 mg by mouth 2 (two) times daily.   Marland Kitchen lamivudine (EPIVIR) 100 MG tablet Take 1 tablet (100 mg total) by mouth daily.  . raltegravir (ISENTRESS) 400 MG tablet Take 1 tablet (400 mg total) by mouth 2 (two) times daily.  . ritonavir (NORVIR) 100 MG TABS tablet Take 1 tablet (100 mg total) by mouth daily.  . zidovudine (RETROVIR) 300 MG tablet Take 1 tablet (300 mg total) by mouth 2 (two) times daily.  Marland Kitchen HYDROcodone-acetaminophen (NORCO) 10-325 MG per tablet Take 1 tablet by mouth every 8 (eight) hours as needed.  . montelukast (SINGULAIR) 10 MG tablet Take 10 mg by mouth 2 (two) times daily.  . naproxen (NAPROSYN) 500 MG tablet Take 1 tablet (500 mg total) by mouth 2 (two) times daily.  . nicotine (NICODERM CQ - DOSED IN MG/24 HOURS) 14 mg/24hr patch Place 1 patch (14 mg total) onto the skin daily.  . nicotine (NICODERM CQ - DOSED IN MG/24 HR) 7 mg/24hr patch Place 1 patch (7 mg total) onto the skin daily.  Marland Kitchen oxyCODONE (ROXICODONE) 5 MG immediate  release tablet Take 1 tablet (5 mg total) by mouth every 6 (six) hours as needed for severe pain.  . predniSONE (DELTASONE) 20 MG tablet 3 tabs po day one, then 2 tabs daily x 4 days     Patient Active Problem List   Diagnosis Date Noted  . End stage renal disease 12/27/2013  . Drug noncompliance 11/07/2013  . History of syphilis 11/07/2013  . Arthritis 09/09/2013  . Tobacco abuse 09/07/2013  . Chronic pain disorder 04/28/2013  . Acute on chronic renal failure 09/17/2012  . HYPERTRIGLYCERIDEMIA 11/01/2009  . Gout 08/13/2009  . ERECTILE DYSFUNCTION 06/08/2008  . 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 HIV DISEASE 05/08/2006  . PERIPHERAL NEUROPATHY 05/08/2006  . Essential hypertension 05/08/2006  . SEIZURE DISORDER 05/08/2006     There are no preventive care reminders to display for this patient.   Review of Systems +gout flare to left hand Physical Exam  Temp(Src) 97.2 F (36.2 C) (Oral)  Wt 227 lb (102.967 kg)  Physical Exam  Constitutional: He is oriented to person, place, and time. He appears well-developed and well-nourished. No distress.  HENT:  Mouth/Throat: Oropharynx is clear and moist. No oropharyngeal exudate.  Cardiovascular: Normal rate, regular rhythm and normal heart sounds. Exam reveals no gallop and no friction rub.  No murmur heard.  Pulmonary/Chest: Effort normal and  breath sounds normal. No respiratory distress. He has no wheezes.  Abdominal: Soft. Bowel sounds are normal. He exhibits no distension. There is no tenderness.  Lymphadenopathy:  He has no cervical adenopathy.  Neurological: He is alert and oriented to person, place, and time.  Skin: left hand 2nd MP joint is warm swollen slight erythamatous, tenderness with range of motin Psychiatric: He has a normal mood and affect. His behavior is normal.     Lab Results  Component Value Date   CD4TCELL 32* 10/20/2013   Lab Results  Component Value Date   CD4TABS 490 10/20/2013   CD4TABS 560 07/18/2013    CD4TABS 600 04/04/2013   Lab Results  Component Value Date   HIV1RNAQUANT <20 10/20/2013   Lab Results  Component Value Date   HEPBSAB NO 09/14/2006   No results found for this basename: RPR    CBC Lab Results  Component Value Date   WBC 6.3 12/18/2013   RBC 2.74* 12/18/2013   HGB 7.5* 04/10/2014   HCT 31.0* 12/18/2013   PLT 151 12/18/2013   MCV 102.6* 12/18/2013   MCH 35.0* 12/18/2013   MCHC 34.2 12/18/2013   RDW 13.8 12/18/2013   LYMPHSABS 1.2 12/18/2013   MONOABS 0.6 12/18/2013   EOSABS 0.1 12/18/2013   BASOSABS 0.0 12/18/2013   BMET Lab Results  Component Value Date   NA 133* 05/01/2014   K 4.8 05/01/2014   CL 95* 05/01/2014   CO2 18* 05/01/2014   GLUCOSE 116* 05/01/2014   BUN 184* 05/01/2014   CREATININE 8.20* 05/01/2014   CALCIUM 8.7 05/01/2014   GFRNONAA 7* 05/01/2014   GFRAA 8* 05/01/2014     Assessment and Plan  hiv = well controlled with current, will review old geno to see if can streamline his regimen. Will get repeat labs.  Gout flare = to left hand 2nd mp. Will give pain meds and have him call rheumatology office or IM office to clarify urolic rx  Health maintenance = received flu vaccine 2 wk ago  Ckd/htn = under-control with current regimen, also followed by Darrell Johnson

## 2014-05-12 ENCOUNTER — Encounter (HOSPITAL_COMMUNITY)
Admission: RE | Admit: 2014-05-12 | Discharge: 2014-05-12 | Disposition: A | Discharge status: Home / Self Care | Payer: Medicare Other | Source: Ambulatory Visit | Attending: Nephrology | Admitting: Nephrology

## 2014-05-12 DIAGNOSIS — N184 Chronic kidney disease, stage 4 (severe): Secondary | ICD-10-CM | POA: Diagnosis not present

## 2014-05-12 DIAGNOSIS — D631 Anemia in chronic kidney disease: Secondary | ICD-10-CM | POA: Insufficient documentation

## 2014-05-12 LAB — HIV-1 RNA QUANT-NO REFLEX-BLD: HIV 1 RNA Quant: <20 copies/mL (ref <20)

## 2014-05-12 LAB — T-HELPER CELL (CD4) - (RCID CLINIC ONLY)
CD4 % Helper T Cell: 32 % — ABNORMAL LOW (ref 33–55)
CD4 T Cell Abs: 440 /uL (ref 400–2700)

## 2014-05-12 LAB — POCT HEMOGLOBIN-HEMACUE: Hemoglobin: 10.8 g/dL — ABNORMAL LOW (ref 13.0–17.0)

## 2014-05-12 MED ORDER — EPOETIN ALFA 20000 UNIT/ML IJ SOLN
INTRAMUSCULAR | Status: AC
  Administered 2014-05-12: 20000 [IU] via SUBCUTANEOUS
  Filled 2014-05-12: qty 1

## 2014-05-12 MED ORDER — EPOETIN ALFA 20000 UNIT/ML IJ SOLN: 20000.0000 [IU] | INTRAMUSCULAR | Status: DC

## 2014-05-12 NOTE — Progress Notes (Signed)
Informed patient he is to call office today to schedule his transplant blood draw. Verbalizes undertanding via [employer]

## 2014-05-24 DIAGNOSIS — M1000 Idiopathic gout, unspecified site: Secondary | ICD-10-CM | POA: Diagnosis not present

## 2014-05-24 DIAGNOSIS — B20 Human immunodeficiency virus [HIV] disease: Secondary | ICD-10-CM | POA: Diagnosis not present

## 2014-05-24 DIAGNOSIS — Z79899 Other long term (current) drug therapy: Secondary | ICD-10-CM | POA: Diagnosis not present

## 2014-05-24 DIAGNOSIS — M25562 Pain in left knee: Secondary | ICD-10-CM | POA: Diagnosis not present

## 2014-05-24 DIAGNOSIS — M2550 Pain in unspecified joint: Secondary | ICD-10-CM | POA: Diagnosis not present

## 2014-05-25 ENCOUNTER — Encounter (HOSPITAL_COMMUNITY): Payer: Self-pay | Admitting: Emergency Medicine

## 2014-05-25 DIAGNOSIS — Z8639 Personal history of other endocrine, nutritional and metabolic disease: Secondary | ICD-10-CM | POA: Insufficient documentation

## 2014-05-25 DIAGNOSIS — Z791 Long term (current) use of non-steroidal anti-inflammatories (NSAID): Secondary | ICD-10-CM | POA: Diagnosis not present

## 2014-05-25 DIAGNOSIS — Z8701 Personal history of pneumonia (recurrent): Secondary | ICD-10-CM | POA: Diagnosis not present

## 2014-05-25 DIAGNOSIS — M79662 Pain in left lower leg: Secondary | ICD-10-CM | POA: Insufficient documentation

## 2014-05-25 DIAGNOSIS — N184 Chronic kidney disease, stage 4 (severe): Secondary | ICD-10-CM | POA: Diagnosis not present

## 2014-05-25 DIAGNOSIS — Z862 Personal history of diseases of the blood and blood-forming organs and certain disorders involving the immune mechanism: Secondary | ICD-10-CM | POA: Diagnosis not present

## 2014-05-25 DIAGNOSIS — H5712 Ocular pain, left eye: Secondary | ICD-10-CM | POA: Insufficient documentation

## 2014-05-25 DIAGNOSIS — Z8781 Personal history of (healed) traumatic fracture: Secondary | ICD-10-CM | POA: Insufficient documentation

## 2014-05-25 DIAGNOSIS — I129 Hypertensive chronic kidney disease with stage 1 through stage 4 chronic kidney disease, or unspecified chronic kidney disease: Secondary | ICD-10-CM | POA: Diagnosis not present

## 2014-05-25 DIAGNOSIS — I10 Essential (primary) hypertension: Secondary | ICD-10-CM | POA: Diagnosis not present

## 2014-05-25 DIAGNOSIS — Z720 Tobacco use: Secondary | ICD-10-CM | POA: Insufficient documentation

## 2014-05-25 DIAGNOSIS — Z21 Asymptomatic human immunodeficiency virus [HIV] infection status: Secondary | ICD-10-CM | POA: Insufficient documentation

## 2014-05-25 DIAGNOSIS — Z7951 Long term (current) use of inhaled steroids: Secondary | ICD-10-CM | POA: Diagnosis not present

## 2014-05-25 DIAGNOSIS — M1990 Unspecified osteoarthritis, unspecified site: Secondary | ICD-10-CM | POA: Diagnosis not present

## 2014-05-25 DIAGNOSIS — M791 Myalgia: Secondary | ICD-10-CM | POA: Diagnosis not present

## 2014-05-25 DIAGNOSIS — M25462 Effusion, left knee: Secondary | ICD-10-CM | POA: Diagnosis not present

## 2014-05-25 DIAGNOSIS — Z8619 Personal history of other infectious and parasitic diseases: Secondary | ICD-10-CM | POA: Diagnosis not present

## 2014-05-25 DIAGNOSIS — Z79899 Other long term (current) drug therapy: Secondary | ICD-10-CM | POA: Insufficient documentation

## 2014-05-25 DIAGNOSIS — M79605 Pain in left leg: Secondary | ICD-10-CM | POA: Diagnosis not present

## 2014-05-25 NOTE — ED Notes (Signed)
Pt. reports left posterior thigh muscle pain /cramps onset last week , denies injury , ambulatory using his cane , respirations unlabored .

## 2014-05-26 ENCOUNTER — Emergency Department (HOSPITAL_COMMUNITY)
Admission: EM | Admit: 2014-05-26 | Discharge: 2014-05-26 | Disposition: A | Discharge status: Home / Self Care | Payer: Medicare Other | Attending: Emergency Medicine | Admitting: Emergency Medicine

## 2014-05-26 ENCOUNTER — Encounter (HOSPITAL_COMMUNITY)
Admission: RE | Admit: 2014-05-26 | Discharge: 2014-05-26 | Disposition: A | Discharge status: Home / Self Care | Payer: Medicare Other | Source: Ambulatory Visit | Attending: Nephrology | Admitting: Nephrology

## 2014-05-26 DIAGNOSIS — D631 Anemia in chronic kidney disease: Secondary | ICD-10-CM | POA: Insufficient documentation

## 2014-05-26 DIAGNOSIS — M79662 Pain in left lower leg: Secondary | ICD-10-CM | POA: Diagnosis not present

## 2014-05-26 DIAGNOSIS — N184 Chronic kidney disease, stage 4 (severe): Secondary | ICD-10-CM | POA: Insufficient documentation

## 2014-05-26 DIAGNOSIS — M791 Myalgia, unspecified site: Secondary | ICD-10-CM

## 2014-05-26 DIAGNOSIS — M79605 Pain in left leg: Secondary | ICD-10-CM

## 2014-05-26 MED ORDER — HYDROCODONE-ACETAMINOPHEN 5-325 MG PO TABS
1.0000 | ORAL_TABLET | Freq: Once | ORAL | Status: AC
  Administered 2014-05-26: 1 via ORAL
  Filled 2014-05-26: qty 1

## 2014-05-26 MED ORDER — HYDROCODONE-ACETAMINOPHEN 5-325 MG PO TABS: 1.0000 | ORAL_TABLET | Freq: Four times a day (QID) | ORAL | Status: DC | PRN

## 2014-05-26 NOTE — Discharge Instructions (Signed)
Make sure you schedule your transplant labs at [facility] every month to avoid being dismissed from receiving kidney.  Schedule your appointment for your injection at [place] on the same day as your appointment at [facility]. Bring your hemoglobin results from [facility] to [place].  Two weeks after transplant labs are drawn and you have received injection at [facility], schedule another appointment at [place] only for hemoglobin draw and injection. Repeat all of  this every month.

## 2014-05-26 NOTE — ED Provider Notes (Signed)
CSN: [id number]     Arrival date & time 05/25/14  2311 History   First MD Initiated Contact with Patient 05/26/14 0034     Chief Complaint  Patient presents with  . Leg Pain     (Consider location/radiation/quality/duration/timing/severity/associated sxs/prior Treatment) Patient is a 48 y.o. male presenting with leg pain. The history is provided by the patient. No language interpreter was used.  Leg Pain Associated symptoms: no back pain, no fever and no neck pain   Darrell Johnson is a 48 year old male with past medical history of hyperlipidemia, hypertension, seizures, syphilis, STDs, chronic kidney disease, HIV presenting to the emergency department with left eye pain. Patient reports that the discomfort is localized to left thigh described as an aching, twitching sensation localized the posterior aspect - pain increases with motion. Stated that on Saturday patient had a cortisone injection performed. Patient reported that he has been using Tylenol with minimal relief. Patient reported the other day he received a cortisone injection to the left knee. Denied numbness, tingling, fall, injury, changes to skin color, but I paid couple fever, chills, swelling. PCP Dr. Genene Churn  Past Medical History  Diagnosis Date  . 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 Hypertension   . Seizures     last seizure was >5 years ago, pt has family history of seizures  . Syphilis 1997    history of syphilis 1997  . Chronic kidney disease     stage 3-4 CKD, followed by Dr. Moshe Cipro  . 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 Rib fractures 01/2009  . HIV infection 1980's    on ART therapy since, followed by ID clinic, complicated  by neuropathy  . 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 Pneumonia   . Arthritis   . Anemia   .  HYPERTENSION 05/08/2006  . Gout, unspecified 08/13/2009    Qualifier: Diagnosis of  By: Redmond Pulling  MD, Mateo Flow     Past Surgical History  Procedure Laterality Date  . Abscess drainage    . Av fistula placement Right 12/02/2013    Procedure: ARTERIOVENOUS (AV) FISTULA CREATION;  Surgeon: Rosetta Posner, MD;  Location: [facility] OR;  Service: Vascular;  Laterality: Right;   Family History  Problem Relation Age of Onset  . Cancer Mother   . COPD Father   . Diabetes Sister   . Hypertension Sister   . Diabetes Brother   . Hypertension Brother   . Stroke Neg Hx    History  Substance Use Topics  . Smoking status: Current Every Day Smoker -- 0.25 packs/day for 20 years    Types: Cigarettes  . Smokeless tobacco: Never Used     Comment: decreasing.  . Alcohol Use: Yes     Comment: Vodka sometimes.    Review of Systems  Constitutional: Negative for fever and chills.  Cardiovascular: Negative for leg swelling.  Musculoskeletal: Positive for myalgias. Negative for back pain and neck pain.  Skin: Negative for color change and wound.  Neurological: Negative for weakness and numbness.      Allergies  Review of patient's allergies indicates no known allergies.  Home Medications   Prior to Admission medications   Medication Sig Start Date End Date Taking? Authorizing Provider  carvedilol (COREG) 25 MG tablet Take 2 tablets (50 mg total)  by mouth 2 (two) times daily with a meal. 04/12/14   Karren Cobble, MD  Darunavir Ethanolate (PREZISTA) 800 MG tablet Take 1 tablet (800 mg total) by mouth daily with breakfast. 05/09/14   Carlyle Basques, MD  doxazosin (CARDURA) 8 MG tablet Take 8 mg by mouth at bedtime.    Historical Provider, MD  Febuxostat (ULORIC) 80 MG TABS Take 80 mg by mouth daily.    Historical Provider, MD  furosemide (LASIX) 40 MG tablet Take 120 mg by mouth 2 (two) times daily.     Historical Provider, MD  HYDROcodone-acetaminophen (NORCO) 10-325 MG per tablet Take 1 tablet by mouth  every 8 (eight) hours as needed. 05/11/14   Carlyle Basques, MD  HYDROcodone-acetaminophen (NORCO/VICODIN) 5-325 MG per tablet Take 1 tablet by mouth every 6 (six) hours as needed. 05/26/14   [name], PA-C  lamivudine (EPIVIR) 100 MG tablet Take 1 tablet (100 mg total) by mouth daily. 05/09/14   Carlyle Basques, MD  montelukast (SINGULAIR) 10 MG tablet Take 10 mg by mouth 2 (two) times daily. 11/03/13   Carlyle Basques, MD  naproxen (NAPROSYN) 500 MG tablet Take 1 tablet (500 mg total) by mouth 2 (two) times daily. 04/01/14   Margarita Mail, PA-C  nicotine (NICODERM CQ - DOSED IN MG/24 HOURS) 14 mg/24hr patch Place 1 patch (14 mg total) onto the skin daily. 05/01/14   Tasrif Ahmed, MD  nicotine (NICODERM CQ - DOSED IN MG/24 HR) 7 mg/24hr patch Place 1 patch (7 mg total) onto the skin daily. 05/01/14   Tasrif Ahmed, MD  oxyCODONE (ROXICODONE) 5 MG immediate release tablet Take 1 tablet (5 mg total) by mouth every 6 (six) hours as needed for severe pain. 03/24/14   Nneka Blanda, PA-C  predniSONE (DELTASONE) 20 MG tablet 3 tabs po day one, then 2 tabs daily x 4 days 03/24/14   Philisha Weinel, PA-C  raltegravir (ISENTRESS) 400 MG tablet Take 1 tablet (400 mg total) by mouth 2 (two) times daily. 05/09/14   Carlyle Basques, MD  ritonavir (NORVIR) 100 MG TABS tablet Take 1 tablet (100 mg total) by mouth daily. 05/09/14   Carlyle Basques, MD  zidovudine (RETROVIR) 300 MG tablet Take 1 tablet (300 mg total) by mouth 2 (two) times daily. 05/09/14   Carlyle Basques, MD   BP 147/99 mmHg  Pulse 99  Temp(Src) 98.4 F (36.9 C) (Oral)  Resp 22  Ht 5\' 11"  (1.803 m)  Wt 225 lb (102.059 kg)  BMI 31.39 kg/m2  SpO2 95% Physical Exam  Constitutional: He is oriented to person, place, and time. He appears well-developed and well-nourished. No distress.  HENT:  Head: Normocephalic and atraumatic.  Eyes: Conjunctivae and EOM are normal. Right eye exhibits no discharge. Left eye exhibits no discharge.  Neck: Normal  range of motion. Neck supple.  Cardiovascular: Normal rate, regular rhythm and normal heart sounds.  Exam reveals no friction rub.   No murmur heard. Pulses:      Radial pulses are 2+ on the right side, and 2+ on the left side.  Pulmonary/Chest: Effort normal and breath sounds normal. No respiratory distress. He has no wheezes. He has no rales.  Musculoskeletal: Normal range of motion. He exhibits tenderness.  Mild swelling noted to the left knee. Negative pain upon palpation to the left knee-circumferentially. Patient keeps the knee in a flexed position. Negative discomfort upon palpation to the left thigh. Negative deformities, ecchymosis, swelling, lesions, sores, open wounds noted to the left hip. Negative palpation  of induration or fluctuance noted-negative findings of drainable abscess. Extension noted to the left knee with discomfort noted-pain with extension.  Neurological: He is alert and oriented to person, place, and time. No cranial nerve deficit. He exhibits normal muscle tone. Coordination normal.  Cranial nerves III-XII grossly intact Strength 5+/5+ to lower extremities bilaterally with resistance applied, equal distribution noted Sensation intact with differentiation to sharp and dull touch  Skin: Skin is warm and dry. No rash noted. He is not diaphoretic. No erythema.  Psychiatric: He has a normal mood and affect. His behavior is normal. Thought content normal.  Nursing note and vitals reviewed.   ED Course  Procedures (including critical care time) Labs Review Labs Reviewed - No data to display  Imaging Review No results found.   EKG Interpretation None      2:06 AM Patient seen and assessed by Dr. Jackquline Berlin per physician reported that this is most likely muscle pain. Septic joint highly unlikely.  MDM   Final diagnoses:  Left leg pain  Myalgia    Medications  HYDROcodone-acetaminophen (NORCO/VICODIN) 5-325 MG per tablet 1 tablet (not administered)     Filed Vitals:   05/25/14 2321  BP: 147/99  Pulse: 99  Temp: 98.4 F (36.9 C)  TempSrc: Oral  Resp: 22  Height: 5\' 11"  (1.803 m)  Weight: 225 lb (102.059 kg)  SpO2: 95%   This provider reviewed the patient's chart. Patient's been seen and assessed in the setting callus times regarding chronic pain-patient has been diagnosed with chronic pain.  Doubt septic joint. Negative fluctuance or induration palpated-negative findings of abscess or drainable lesion. Patient seen and assessed by attending physician, Dr. Jackquline Berlin per physician does not recommend imaging or further workup-reports that this is most likely muscular in nature. Patient stable, afebrile. Patient not septic appearing. Pulse palpable strong. Negative focal neurological deficits noted. Discharge patient. Discharge based pain medications - discussed course, precautions, disposal technique. Referred patient to PCP and orthopedics. Discussed with patient to closely monitor symptoms and if symptoms are to worsen or change to report back to the ED - strict return instructions given.  Patient agreed to plan of care, understood, all questions answered.   Jamse Mead, PA-C 05/26/14 0246  Julianne Rice, MD 05/26/14 [phone]

## 2014-05-26 NOTE — Discharge Instructions (Signed)
Please call your doctor for a followup appointment within 24-48 hours. When you talk to your doctor please let them know that you were seen in the emergency department and have them acquire all of your records so that they can discuss the findings with you and formulate a treatment plan to fully care for your new and ongoing problems. Please call and set-up an appointment with your primary care provider Please call and set-up an appointment with Orthopedics Please rest and stay hydrated Please take medications as prescribed - while on pain medications there is to be no drinking alcohol, driving, operating any heavy machinery. If extra please dispose in a proper manner. Please do not take any extra Tylenol with this medication for this can lead to Tylenol overdose and liver issues.  Please continue to monitor symptoms closely and if symptoms are to worsen or change (fever greater than 101, chills, sweating, nausea, vomiting, chest pain, shortness of breathe, difficulty breathing, weakness, numbness, tingling, worsening or changes to pain pattern, fall, injury, swelling, tingling, changes to skin color, red streaks) please report back to the Emergency Department immediately.   Muscle Pain Muscle pain (myalgia) may be caused by many things, including:  Overuse or muscle strain, especially if you are not in shape. This is the most common cause of muscle pain.  Injury.  Bruises.  Viruses, such as the flu.  Infectious diseases.  Fibromyalgia, which is a chronic condition that causes muscle tenderness, fatigue, and headache.  Autoimmune diseases, including lupus.  Certain drugs, including ACE inhibitors and statins. Muscle pain may be mild or severe. In most cases, the pain lasts only a short time and goes away without treatment. To diagnose the cause of your muscle pain, your health care provider will take your medical history. This means he or she will ask you when your muscle pain began and what  has been happening. If you have not had muscle pain for very long, your health care provider may want to wait before doing much testing. If your muscle pain has lasted a long time, your health care provider may want to run tests right away. If your health care provider thinks your muscle pain may be caused by illness, you may need to have additional tests to rule out certain conditions.  Treatment for muscle pain depends on the cause. Home care is often enough to relieve muscle pain. Your health care provider may also prescribe anti-inflammatory medicine. HOME CARE INSTRUCTIONS Watch your condition for any changes. The following actions may help to lessen any discomfort you are feeling:  Only take over-the-counter or prescription medicines as directed by your health care provider.  Apply ice to the sore muscle:  Put ice in a plastic bag.  Place a towel between your skin and the bag.  Leave the ice on for 15-20 minutes, 3-4 times a day.  You may alternate applying hot and cold packs to the muscle as directed by your health care provider.  If overuse is causing your muscle pain, slow down your activities until the pain goes away.  Remember that it is normal to feel some muscle pain after starting a workout program. Muscles that have not been used often will be sore at first.  Do regular, gentle exercises if you are not usually active.  Warm up before exercising to lower your risk of muscle pain.  Do not continue working out if the pain is very bad. Bad pain could mean you have injured a muscle. [place]  CARE IF:  Your muscle pain gets worse, and medicines do not help.  You have muscle pain that lasts longer than 3 days.  You have a rash or fever along with muscle pain.  You have muscle pain after a tick bite.  You have muscle pain while working out, even though you are in good physical condition.  You have redness, soreness, or swelling along with muscle pain.  You have  muscle pain after starting a new medicine or changing the dose of a medicine. SEEK IMMEDIATE MEDICAL CARE IF:  You have trouble breathing.  You have trouble swallowing.  You have muscle pain along with a stiff neck, fever, and vomiting.  You have severe muscle weakness or cannot move part of your body. MAKE SURE YOU:   Understand these instructions.  Will watch your condition.  Will get help right away if you are not doing well or get worse. Document Released: 05/29/2006 Document Revised: 07/12/2013 Document Reviewed: 05/03/2013 [facility] Patient Information 2015 [place], [place]. This information is not intended to replace advice given to you by your health care provider. Make sure you discuss any questions you have with your health care provider.  Chronic Pain Discharge Instructions  Emergency care providers appreciate that many patients coming to [place] are in severe pain and we wish to address their pain in the safest, most responsible manner.  It is important to recognize however, that the proper treatment of chronic pain differs from that of the pain of injuries and acute illnesses.  Our goal is to provide quality, safe, personalized care and we thank you for giving [place] the opportunity to serve you. The use of narcotics and related agents for chronic pain syndromes may lead to additional physical and psychological problems.  Nearly as many people die from prescription narcotics each year as die from car crashes.  Additionally, this risk is increased if such prescriptions are obtained from a variety of sources.  Therefore, only your primary care physician or a pain management specialist is able to safely treat such syndromes with narcotic medications long-term.    Documentation revealing such prescriptions have been sought from multiple sources may prohibit [place] from providing a refill or different narcotic medication.  Your name may be checked first through the [place].  This database is a record of controlled substance medication prescriptions that the patient has received.  This has been established by [facility] in an effort to eliminate the dangerous, and often life threatening, practice of obtaining multiple prescriptions from different medical providers.   If you have a chronic pain syndrome (i.e. chronic headaches, recurrent back or neck pain, dental pain, abdominal or pelvis pain without a specific diagnosis, or neuropathic pain such as fibromyalgia) or recurrent visits for the same condition without an acute diagnosis, you may be treated with non-narcotics and other non-addictive medicines.  Allergic reactions or negative side effects that may be reported by a patient to such medications will not typically lead to the use of a narcotic analgesic or other controlled substance as an alternative.   Patients managing chronic pain with a personal physician should have provisions in place for breakthrough pain.  If you are in crisis, you should call your physician.  If your physician directs you to the emergency department, please have the doctor call and speak to our attending physician concerning your care.   When patients come to the Emergency Department (ED) with acute medical conditions in which the Emergency  Department physician feels appropriate to prescribe narcotic or sedating pain medication, the physician will prescribe these in very limited quantities.  The amount of these medications will last only until you can see your primary care physician in his/her office.  Any patient who returns to the ED seeking refills should expect only non-narcotic pain medications.   In the event of an acute medical condition exists and the emergency physician feels it is necessary that the patient be given a narcotic or sedating medication -  a responsible adult driver should be present in the room prior to the medication being given by the  nurse.   Prescriptions for narcotic or sedating medications that have been lost, stolen or expired will not be refilled in the Emergency Department.    Patients who have chronic pain may receive non-narcotic prescriptions until seen by their primary care physician.  It is every patients personal responsibility to maintain active prescriptions with his or her primary care physician or specialist.

## 2014-05-26 NOTE — Progress Notes (Signed)
Spoke with Crystal at [employer] regarding [facility] lab and injection schedule. Explained schedule to patient as per Crystal. Printed instructions for patient and wrote on appointment card. Patient verbalizes understanding. Patient rescheduled for Monday after his appt at [facility].

## 2014-05-29 ENCOUNTER — Encounter (HOSPITAL_COMMUNITY)
Admission: RE | Admit: 2014-05-29 | Discharge: 2014-05-29 | Disposition: A | Discharge status: Home / Self Care | Payer: Medicare Other | Source: Ambulatory Visit | Attending: Nephrology | Admitting: Nephrology

## 2014-05-29 DIAGNOSIS — N184 Chronic kidney disease, stage 4 (severe): Secondary | ICD-10-CM | POA: Diagnosis not present

## 2014-05-29 DIAGNOSIS — N189 Chronic kidney disease, unspecified: Secondary | ICD-10-CM | POA: Diagnosis not present

## 2014-05-29 DIAGNOSIS — N2581 Secondary hyperparathyroidism of renal origin: Secondary | ICD-10-CM | POA: Diagnosis not present

## 2014-05-29 DIAGNOSIS — N183 Chronic kidney disease, stage 3 (moderate): Secondary | ICD-10-CM | POA: Diagnosis not present

## 2014-05-29 DIAGNOSIS — D631 Anemia in chronic kidney disease: Secondary | ICD-10-CM | POA: Diagnosis not present

## 2014-05-29 MED ORDER — EPOETIN ALFA 20000 UNIT/ML IJ SOLN
INTRAMUSCULAR | Status: AC
  Filled 2014-05-29: qty 1

## 2014-05-29 MED ORDER — EPOETIN ALFA 20000 UNIT/ML IJ SOLN
20000.0000 [IU] | INTRAMUSCULAR | Status: DC
  Administered 2014-05-29: 20000 [IU] via SUBCUTANEOUS

## 2014-05-29 MED ORDER — CLONIDINE HCL 0.1 MG PO TABS: 0.1000 mg | ORAL_TABLET | Freq: Once | ORAL | Status: AC | PRN

## 2014-06-06 ENCOUNTER — Other Ambulatory Visit: Payer: Self-pay | Admitting: *Deleted

## 2014-06-06 DIAGNOSIS — I10 Essential (primary) hypertension: Secondary | ICD-10-CM

## 2014-06-07 MED ORDER — CARVEDILOL 25 MG PO TABS: 50.0000 mg | ORAL_TABLET | Freq: Two times a day (BID) | ORAL | Status: DC

## 2014-06-12 ENCOUNTER — Encounter (HOSPITAL_COMMUNITY): Payer: Medicare Other

## 2014-06-20 DIAGNOSIS — N2581 Secondary hyperparathyroidism of renal origin: Secondary | ICD-10-CM | POA: Diagnosis not present

## 2014-06-20 DIAGNOSIS — I129 Hypertensive chronic kidney disease with stage 1 through stage 4 chronic kidney disease, or unspecified chronic kidney disease: Secondary | ICD-10-CM | POA: Diagnosis not present

## 2014-06-20 DIAGNOSIS — D631 Anemia in chronic kidney disease: Secondary | ICD-10-CM | POA: Diagnosis not present

## 2014-06-20 DIAGNOSIS — N184 Chronic kidney disease, stage 4 (severe): Secondary | ICD-10-CM | POA: Diagnosis not present

## 2014-06-23 ENCOUNTER — Encounter (HOSPITAL_COMMUNITY)
Admission: RE | Admit: 2014-06-23 | Discharge: 2014-06-23 | Disposition: A | Discharge status: Home / Self Care | Payer: Medicare Other | Source: Ambulatory Visit | Attending: Nephrology | Admitting: Nephrology

## 2014-06-23 DIAGNOSIS — N184 Chronic kidney disease, stage 4 (severe): Secondary | ICD-10-CM | POA: Insufficient documentation

## 2014-06-23 DIAGNOSIS — D631 Anemia in chronic kidney disease: Secondary | ICD-10-CM | POA: Diagnosis not present

## 2014-06-23 LAB — POCT HEMOGLOBIN-HEMACUE: HEMOGLOBIN: 10 g/dL — AB (ref 13.0–17.0)

## 2014-06-23 MED ORDER — EPOETIN ALFA 20000 UNIT/ML IJ SOLN
20000.0000 [IU] | INTRAMUSCULAR | Status: DC
  Administered 2014-06-23: 20000 [IU] via SUBCUTANEOUS

## 2014-07-04 ENCOUNTER — Ambulatory Visit (INDEPENDENT_AMBULATORY_CARE_PROVIDER_SITE_OTHER): Payer: Medicare Other | Admitting: Internal Medicine

## 2014-07-04 ENCOUNTER — Encounter: Payer: Self-pay | Admitting: Internal Medicine

## 2014-07-04 VITALS — BP 176/90 | HR 88 | Temp 98.3°F | Resp 20 | Ht 69.5 in | Wt 224.4 lb

## 2014-07-04 DIAGNOSIS — K0889 Other specified disorders of teeth and supporting structures: Secondary | ICD-10-CM

## 2014-07-04 DIAGNOSIS — K088 Other specified disorders of teeth and supporting structures: Secondary | ICD-10-CM

## 2014-07-04 MED ORDER — HYDROCODONE-ACETAMINOPHEN 10-325 MG PO TABS: 1.0000 | ORAL_TABLET | Freq: Three times a day (TID) | ORAL | Status: DC | PRN

## 2014-07-04 NOTE — Assessment & Plan Note (Addendum)
Pt in severe pain, he has not been able to eat a solid diet and has been drinking soups for at least a week due to pain in his tooth. Pt today says oxycodone 10 has helped. He was given 60 pills when he was went for his ID visit in October. Pt has a pain contract for oxycodone-acetaminophen 10-325mg .   Per Chart 2013 - he violated his contract, but was given a  Second chance. He also had a new contract in 2014. As pt does have a reason to have pain, will prescibe meds- 60 tabs, 10-325mg  oxycodone Q8H, patient cautioned to use judiciously till he gets his tooth extraction, no refills, pending if his PCP wants to refill it for him or give him a new contract.   Plan- Oxycodone- acetaminophen 10-325mg  TID, #60. - Encouraged to keep dental appointment.

## 2014-07-04 NOTE — Assessment & Plan Note (Signed)
ELevated today- 176/90. Pt has not taken his medication today, and he is in pain.

## 2014-07-04 NOTE — Progress Notes (Signed)
Patient ID: Darrell Johnson, male   DOB: January 11, 1966, 48 y.o.   MRN: [id number]   Subjective:   Patient ID: Darrell Johnson male   DOB: 01-02-1966 48 y.o.   MRN: [id number]  HPI: Mr.[name] is a 47 y.o. with PMh listed below. Presented today with c/o tooth pain. Patients pain is in several tooth. Pt has been seen by the dentist 6 months ago, and has been on the schedule to have tooth extraction 08/17/2013, so he has been waiting all this time. Pain involves 3 of his tooth- 2 on the left side, upper and lower jaw, and the right side- upper jaw. Pt says pain has prevented him from sleeping, and that he has not been able to eat anything, he has ben drinking only soups.   Past Medical History  Diagnosis Date  . 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 Hypertension   . Seizures     last seizure was >5 years ago, pt has family history of seizures  . Syphilis 1997    history of syphilis 1997  . Chronic kidney disease     stage 3-4 CKD, followed by Dr. Moshe Cipro  . 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 Rib fractures 01/2009  . HIV infection 1980's    on ART therapy since, followed by ID clinic, complicated  by neuropathy  . 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 Pneumonia   . Arthritis   . Anemia   . HYPERTENSION 05/08/2006  . Gout, unspecified 08/13/2009    Qualifier: Diagnosis of  By: Redmond Pulling  MD, Mateo Flow     Current Outpatient Prescriptions  Medication Sig Dispense Refill  . carvedilol (COREG) 25 MG tablet Take 2 tablets (50 mg total) by mouth 2 (two) times daily with a meal. 120 tablet 11  . Darunavir Ethanolate (PREZISTA) 800 MG tablet Take 1 tablet (800 mg total) by mouth daily with breakfast. 30 tablet 5  . doxazosin (CARDURA) 8 MG tablet Take 8 mg by mouth at bedtime.    . Febuxostat  (ULORIC) 80 MG TABS Take 80 mg by mouth daily.    . furosemide (LASIX) 40 MG tablet Take 120 mg by mouth 2 (two) times daily.     Marland Kitchen HYDROcodone-acetaminophen (NORCO) 10-325 MG per tablet Take 1 tablet by mouth every 8 (eight) hours as needed. 60 tablet 0  . lamivudine (EPIVIR) 100 MG tablet Take 1 tablet (100 mg total) by mouth daily. 30 tablet 5  . montelukast (SINGULAIR) 10 MG tablet Take 10 mg by mouth 2 (two) times daily.    . naproxen (NAPROSYN) 500 MG tablet Take 1 tablet (500 mg total) by mouth 2 (two) times daily. 30 tablet 0  . nicotine (NICODERM CQ - DOSED IN MG/24 HOURS) 14 mg/24hr patch Place 1 patch (14 mg total) onto the skin daily. 42 patch 0  . nicotine (NICODERM CQ - DOSED IN MG/24 HR) 7 mg/24hr patch Place 1 patch (7 mg total) onto the skin daily. 14 patch 0  . oxyCODONE (ROXICODONE) 5 MG immediate release tablet Take 1 tablet (5 mg total) by mouth every 6 (six) hours as needed for severe pain. 5 tablet 0  . predniSONE (DELTASONE) 20 MG tablet 3 tabs po day one, then 2 tabs daily x 4 days 11 tablet 0  .  raltegravir (ISENTRESS) 400 MG tablet Take 1 tablet (400 mg total) by mouth 2 (two) times daily. 60 tablet 5  . ritonavir (NORVIR) 100 MG TABS tablet Take 1 tablet (100 mg total) by mouth daily. 30 tablet 5  . zidovudine (RETROVIR) 300 MG tablet Take 1 tablet (300 mg total) by mouth 2 (two) times daily. 60 tablet 5   No current facility-administered medications for this visit.   Family History  Problem Relation Age of Onset  . Cancer Mother   . COPD Father   . Diabetes Sister   . Hypertension Sister   . Diabetes Brother   . Hypertension Brother   . Stroke Neg Hx    History   Social History  . Marital Status: Married    Spouse Name: N/A    Number of Children: N/A  . Years of Education: N/A   Social History Main Topics  . Smoking status: Current Every Day Smoker -- 0.25 packs/day for 20 years    Types: Cigarettes  . Smokeless tobacco: Never Used     Comment:  decreasing.  . Alcohol Use: 0.0 oz/week    0 Not specified per week     Comment: Vodka sometimes.  . Drug Use: No  . Sexual Activity: Not on file     Comment: given condoms   Other Topics Concern  . Not on file   Social History Narrative   Review of Systems: CONSTITUTIONAL- No Fever, weightloss, night sweat or change in appetite. SKIN- No Rash, colour changes or itching. HEAD- No Headache or dizziness. EYES- No Vision loss, pain, redness, double or blurred vision. RESPIRATORY- No Cough or SOB. CARDIAC- No Palpitations, DOE, PND or chest pain. GI- No nausea, vomiting, diarrhoea, constipation, abd pain. URINARY- No Frequency, urgency, straining or dysuria. NEUROLOGIC- No Numbness, syncope, seizures PYSCH- Denies depression or anxiety.  Objective:  Physical Exam: Filed Vitals:   07/04/14 0941  BP: 176/90  Pulse: 88  Temp: 98.3 F (36.8 C)  TempSrc: Oral  Resp: 20  Height: 5' 9.5" (1.765 m)  Weight: 224 lb 6.4 oz (101.787 kg)  SpO2: 100%   GENERAL- alert, co-operative, appears as stated age, not in any distress. HEENT- Atraumatic, normocephalic, PERRL, EOMI, oral mucosa appears moist, neck supple, oropharnyx exam- poor dentition overall, all three tooth with complaints are chipped severely, with exposure of gum between fragments of tooth, also discoloured, surrounding area with some swelling, but not inflammed in appearanace.  CARDIAC- RRR, no murmurs, rubs or gallops. RESP- Moving equal volumes of air, and clear to auscultation bilaterally, no wheezes or crackles. ABDOMEN- Soft, nontender, no palpable masses or organomegaly, bowel sounds present. NEURO- No obvious Cr N abnormality, strenght upper and lower extremities-  intact, Gait- Normal. EXTREMITIES- pulse 2+, symmetric, no pedal edema. SKIN- Warm, dry, No rash or lesion. PSYCH- Normal mood and affect, appropriate thought content and speech.  Assessment & Plan:  The patient's case and plan of care was discussed with  attending physician, Dr. Daryll Drown.  Please see problem based charting for assessment and plan.

## 2014-07-04 NOTE — Patient Instructions (Addendum)
General Instructions:  Please keep your appointment with the dentist.  Also be sure to use this medication judiciously as this is not a permament medication that would be refilled.   Please bring your medicines with you each time you come to clinic.  Medicines may include prescription medications, over-the-counter medications, herbal remedies, eye drops, vitamins, or other pills.

## 2014-07-06 ENCOUNTER — Other Ambulatory Visit (HOSPITAL_COMMUNITY): Payer: Self-pay | Admitting: *Deleted

## 2014-07-06 NOTE — Progress Notes (Signed)
Internal Medicine Clinic Attending  Case discussed with Dr. Emokpae soon after the resident saw the patient.  We reviewed the resident's history and exam and pertinent patient test results.  I agree with the assessment, diagnosis, and plan of care documented in the resident's note. 

## 2014-07-07 ENCOUNTER — Encounter (INDEPENDENT_AMBULATORY_CARE_PROVIDER_SITE_OTHER): Payer: Self-pay

## 2014-07-07 ENCOUNTER — Encounter (HOSPITAL_COMMUNITY)
Admission: RE | Admit: 2014-07-07 | Discharge: 2014-07-07 | Disposition: A | Discharge status: Home / Self Care | Payer: Medicare Other | Source: Ambulatory Visit | Attending: Nephrology | Admitting: Nephrology

## 2014-07-07 DIAGNOSIS — D631 Anemia in chronic kidney disease: Secondary | ICD-10-CM | POA: Diagnosis not present

## 2014-07-07 LAB — POCT HEMOGLOBIN-HEMACUE: Hemoglobin: 11.4 g/dL — ABNORMAL LOW (ref 13.0–17.0)

## 2014-07-07 MED ORDER — EPOETIN ALFA 20000 UNIT/ML IJ SOLN
INTRAMUSCULAR | Status: DC
Start: 2014-07-07 — End: 2014-07-08
  Filled 2014-07-07: qty 1

## 2014-07-07 MED ORDER — EPOETIN ALFA 20000 UNIT/ML IJ SOLN
20000.0000 [IU] | INTRAMUSCULAR | Status: DC
  Administered 2014-07-07: 20000 [IU] via SUBCUTANEOUS

## 2014-07-10 DIAGNOSIS — B20 Human immunodeficiency virus [HIV] disease: Secondary | ICD-10-CM | POA: Diagnosis not present

## 2014-07-10 DIAGNOSIS — M1000 Idiopathic gout, unspecified site: Secondary | ICD-10-CM | POA: Diagnosis not present

## 2014-07-10 DIAGNOSIS — M2550 Pain in unspecified joint: Secondary | ICD-10-CM | POA: Diagnosis not present

## 2014-07-10 DIAGNOSIS — Z79899 Other long term (current) drug therapy: Secondary | ICD-10-CM | POA: Diagnosis not present

## 2014-07-25 ENCOUNTER — Inpatient Hospital Stay (HOSPITAL_COMMUNITY): Admission: RE | Admit: 2014-07-25 | Payer: Medicare Other | Source: Ambulatory Visit

## 2014-08-01 DIAGNOSIS — N184 Chronic kidney disease, stage 4 (severe): Secondary | ICD-10-CM | POA: Diagnosis not present

## 2014-08-01 DIAGNOSIS — N2581 Secondary hyperparathyroidism of renal origin: Secondary | ICD-10-CM | POA: Diagnosis not present

## 2014-08-01 DIAGNOSIS — N189 Chronic kidney disease, unspecified: Secondary | ICD-10-CM | POA: Diagnosis not present

## 2014-08-03 ENCOUNTER — Encounter (HOSPITAL_COMMUNITY)
Admission: RE | Admit: 2014-08-03 | Discharge: 2014-08-03 | Disposition: A | Discharge status: Home / Self Care | Payer: Medicare Other | Source: Ambulatory Visit | Attending: Nephrology | Admitting: Nephrology

## 2014-08-03 DIAGNOSIS — D631 Anemia in chronic kidney disease: Secondary | ICD-10-CM | POA: Diagnosis not present

## 2014-08-03 DIAGNOSIS — N184 Chronic kidney disease, stage 4 (severe): Secondary | ICD-10-CM | POA: Diagnosis not present

## 2014-08-03 MED ORDER — EPOETIN ALFA 20000 UNIT/ML IJ SOLN
INTRAMUSCULAR | Status: AC
  Filled 2014-08-03: qty 1

## 2014-08-03 MED ORDER — EPOETIN ALFA 20000 UNIT/ML IJ SOLN
20000.0000 [IU] | INTRAMUSCULAR | Status: DC
  Administered 2014-08-03: 20000 [IU] via SUBCUTANEOUS

## 2014-08-17 ENCOUNTER — Encounter: Payer: Self-pay | Admitting: Internal Medicine

## 2014-08-17 ENCOUNTER — Ambulatory Visit (INDEPENDENT_AMBULATORY_CARE_PROVIDER_SITE_OTHER): Payer: Medicare Other | Admitting: Internal Medicine

## 2014-08-17 ENCOUNTER — Other Ambulatory Visit: Payer: Self-pay | Admitting: *Deleted

## 2014-08-17 ENCOUNTER — Other Ambulatory Visit (HOSPITAL_COMMUNITY)
Admission: RE | Admit: 2014-08-17 | Discharge: 2014-08-17 | Disposition: A | Discharge status: Home / Self Care | Payer: Medicare Other | Source: Ambulatory Visit | Attending: Internal Medicine | Admitting: Internal Medicine

## 2014-08-17 ENCOUNTER — Encounter (HOSPITAL_COMMUNITY)
Admission: RE | Admit: 2014-08-17 | Discharge: 2014-08-17 | Disposition: A | Discharge status: Home / Self Care | Payer: Medicare Other | Source: Ambulatory Visit | Attending: Nephrology | Admitting: Nephrology

## 2014-08-17 VITALS — BP 182/106 | HR 103 | Temp 98.4°F | Wt 222.0 lb

## 2014-08-17 DIAGNOSIS — N184 Chronic kidney disease, stage 4 (severe): Secondary | ICD-10-CM | POA: Diagnosis not present

## 2014-08-17 DIAGNOSIS — K0889 Other specified disorders of teeth and supporting structures: Secondary | ICD-10-CM

## 2014-08-17 DIAGNOSIS — Z113 Encounter for screening for infections with a predominantly sexual mode of transmission: Secondary | ICD-10-CM

## 2014-08-17 DIAGNOSIS — K088 Other specified disorders of teeth and supporting structures: Secondary | ICD-10-CM | POA: Diagnosis not present

## 2014-08-17 DIAGNOSIS — N185 Chronic kidney disease, stage 5: Secondary | ICD-10-CM

## 2014-08-17 DIAGNOSIS — D631 Anemia in chronic kidney disease: Secondary | ICD-10-CM | POA: Diagnosis not present

## 2014-08-17 DIAGNOSIS — B20 Human immunodeficiency virus [HIV] disease: Secondary | ICD-10-CM | POA: Diagnosis not present

## 2014-08-17 DIAGNOSIS — Z79899 Other long term (current) drug therapy: Secondary | ICD-10-CM | POA: Diagnosis not present

## 2014-08-17 DIAGNOSIS — Z8611 Personal history of tuberculosis: Secondary | ICD-10-CM | POA: Diagnosis not present

## 2014-08-17 LAB — COMPLETE METABOLIC PANEL WITHOUT GFR
ALT: 23 U/L (ref 0–53)
AST: 15 U/L (ref 0–37)
Albumin: 3.5 g/dL (ref 3.5–5.2)
Alkaline Phosphatase: 70 U/L (ref 39–117)
BUN: 85 mg/dL — ABNORMAL HIGH (ref 6–23)
CO2: 21 meq/L (ref 19–32)
Calcium: 7.3 mg/dL — ABNORMAL LOW (ref 8.4–10.5)
Chloride: 107 meq/L (ref 96–112)
Creat: 6.67 mg/dL — ABNORMAL HIGH (ref 0.50–1.35)
GFR, Est African American: 10 mL/min — ABNORMAL LOW
GFR, Est Non African American: 9 mL/min — ABNORMAL LOW
Glucose, Bld: 83 mg/dL (ref 70–99)
Potassium: 4.3 meq/L (ref 3.5–5.3)
Sodium: 142 meq/L (ref 135–145)
Total Bilirubin: 0.3 mg/dL (ref 0.2–1.2)
Total Protein: 5.9 g/dL — ABNORMAL LOW (ref 6.0–8.3)

## 2014-08-17 LAB — CBC WITH DIFFERENTIAL/PLATELET
Basophils Absolute: 0 K/uL (ref 0.0–0.1)
Basophils Relative: 0 % (ref 0–1)
Eosinophils Absolute: 0.1 K/uL (ref 0.0–0.7)
Eosinophils Relative: 2 % (ref 0–5)
HCT: 27.8 % — ABNORMAL LOW (ref 39.0–52.0)
Hemoglobin: 9.3 g/dL — ABNORMAL LOW (ref 13.0–17.0)
Lymphocytes Relative: 10 % — ABNORMAL LOW (ref 12–46)
Lymphs Abs: 0.7 K/uL (ref 0.7–4.0)
MCH: 33.9 pg (ref 26.0–34.0)
MCHC: 33.5 g/dL (ref 30.0–36.0)
MCV: 101.5 fL — ABNORMAL HIGH (ref 78.0–100.0)
MPV: 10.8 fL (ref 8.6–12.4)
Monocytes Absolute: 0.6 K/uL (ref 0.1–1.0)
Monocytes Relative: 8 % (ref 3–12)
Neutro Abs: 5.8 K/uL (ref 1.7–7.7)
Neutrophils Relative %: 80 % — ABNORMAL HIGH (ref 43–77)
Platelets: 140 K/uL — ABNORMAL LOW (ref 150–400)
RBC: 2.74 MIL/uL — ABNORMAL LOW (ref 4.22–5.81)
RDW: 16.7 % — ABNORMAL HIGH (ref 11.5–15.5)
WBC: 7.3 K/uL (ref 4.0–10.5)

## 2014-08-17 MED ORDER — HYDROCODONE-ACETAMINOPHEN 10-325 MG PO TABS: 1.0000 | ORAL_TABLET | Freq: Three times a day (TID) | ORAL | Status: DC | PRN

## 2014-08-17 MED ORDER — LAMIVUDINE 10 MG/ML PO SOLN: 50.0000 mg | Freq: Every day | ORAL | Status: DC

## 2014-08-17 MED ORDER — ZIDOVUDINE 300 MG PO TABS: 300.0000 mg | ORAL_TABLET | Freq: Every day | ORAL | Status: DC

## 2014-08-17 MED ORDER — EPOETIN ALFA 20000 UNIT/ML IJ SOLN
INTRAMUSCULAR | Status: AC
  Administered 2014-08-17: 20000 [IU] via SUBCUTANEOUS
  Filled 2014-08-17: qty 1

## 2014-08-17 MED ORDER — AMOXICILLIN 500 MG PO TABS: 500.0000 mg | ORAL_TABLET | Freq: Every day | ORAL | Status: DC

## 2014-08-17 MED ORDER — EPOETIN ALFA 20000 UNIT/ML IJ SOLN
20000.0000 [IU] | INTRAMUSCULAR | Status: DC
  Administered 2014-08-17: 20000 [IU] via SUBCUTANEOUS

## 2014-08-17 NOTE — Progress Notes (Signed)
Patient ID: Darrell Johnson, male   DOB: [birth year], 49 y.o.   MRN: [id number]  HPI: Darrell Johnson is a 49 y.o. male who is here for his HIV f/u.   Allergies: No Known Allergies  Vitals: Temp: 98.4 F (36.9 C) (01/28 1055) Temp Source: Oral (01/28 1055) BP: 182/106 mmHg (01/28 1118) Pulse Rate: 103 (01/28 1055)  Past Medical History: Past Medical History  Diagnosis Date  . 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 Hypertension   . Seizures     last seizure was >5 years ago, pt has family history of seizures  . Syphilis 1997    history of syphilis 1997  . Chronic kidney disease     stage 3-4 CKD, followed by Dr. Moshe Cipro  . 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 Rib fractures 01/2009  . HIV infection 1980's    on ART therapy since, followed by ID clinic, complicated  by neuropathy  . 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 Pneumonia   . Arthritis   . Anemia   . HYPERTENSION 05/08/2006  . Gout, unspecified 08/13/2009    Qualifier: Diagnosis of  By: Redmond Pulling  MD, Mateo Flow      Social History: History   Social History  . Marital Status: Married    Spouse Name: N/A    Number of Children: N/A  . Years of Education: N/A   Social History Main Topics  . Smoking status: Current Every Day Smoker -- 0.25 packs/day for 20 years    Types: Cigarettes  . Smokeless tobacco: Never Used     Comment: decreasing.  . Alcohol Use: No  . Drug Use: No  . Sexual Activity: None     Comment: given condoms   Other Topics Concern  . None   Social History Narrative    Previous Regimen: KLT/CBV/TDF  Current Regimen: DRV/r + 3TC + RAL + AZT  Labs: HIV 1 RNA QUANT (copies/mL)  Date Value  05/11/2014 <20  10/20/2013 <20  07/18/2013 <20   CD4 T CELL ABS (/uL)  Date Value  05/11/2014 440   10/20/2013 490  07/18/2013 560   HEP B S AB (no units)  Date Value  09/14/2006 NO   HEPATITIS B SURFACE AG (no units)  Date Value  09/14/2006 NO   HCV AB (no units)  Date Value  09/14/2006 NO    CrCl: CrCl cannot be calculated (Patient has no serum creatinine result on file.).  Lipids:    Component Value Date/Time   CHOL 229* 08/27/2012 1530   TRIG 289* 08/27/2012 1530   HDL 39* 08/27/2012 1530   CHOLHDL 5.9 08/27/2012 1530   VLDL 58* 08/27/2012 1530   LDLCALC 132* 08/27/2012 1530    Assessment: 49 yo with a hx of HIV for a while. He is currently well controlled on his regimen. It's complicated because of his renal issue. He is at ESRD stage now and is being eval for renal transplant. We are trying to simplify his regimen a little bit. We can't find his paper chart to eval his genotype. From the limited info, I think he was started on KLT/CBV then TDF was added prob due to m184v. Then the regimen was split up due to his worsening renal function. I believe at that point, TDF was discontinue and  RAL was used in its place. Since we are doing labs today, I'm going to add HLA testing to it to see if we can use ABC in the future. Scr is elevated so we are going to adjust his AZT and 3TC   Recommendations:  Cont DRV/r 800/11m PO qday Cont RAL 4065mPO BID Change AZT to 30022mO qday Change 3TC to 51m78m qday F/u in the future to see if we can simplify  PhamWilfred LacyarmD Clinical Infectious DiseB[facility] 08/17/2014, 1:48 PM

## 2014-08-17 NOTE — Progress Notes (Signed)
Patient ID: Darrell Johnson, male   DOB: [birth year], 49 y.o.   MRN: [id number]       Patient ID: Darrell Johnson, male   DOB: 01/12/1966, 49 y.o.   MRN: [id number]  HPI 49yo M with HIV disease, hx of mTB in 2004, hx of syphilis, seizure d/o, CKD 5. Also being evaluated renal transplant. He Has sinus pain, headache, from his recent dental pain. He denies any fever, chills, nightsweats. He has been taking his HIV medications regularly without missing a dose.   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 RLG added, and TDF removed RLG/DRVr/combivir. Fall 2013,  kaletra -> DRVr RLG/DRVr/lamivudine/zidovudine: 2014- present, with renal dose adjustments with CKD 5.   Outpatient Encounter Prescriptions as of 08/17/2014  Medication Sig  . Darunavir Ethanolate (PREZISTA) 800 MG tablet Take 1 tablet (800 mg total) by mouth daily with breakfast.  . doxazosin (CARDURA) 8 MG tablet Take 8 mg by mouth at bedtime.  . Febuxostat (ULORIC) 80 MG TABS Take 80 mg by mouth daily.  . furosemide (LASIX) 40 MG tablet Take 120 mg by mouth 2 (two) times daily.   Marland Kitchen lamivudine (EPIVIR) 100 MG tablet Take 1 tablet (100 mg total) by mouth daily.  . montelukast (SINGULAIR) 10 MG tablet Take 10 mg by mouth 2 (two) times daily.  . naproxen (NAPROSYN) 500 MG tablet Take 1 tablet (500 mg total) by mouth 2 (two) times daily.  . raltegravir (ISENTRESS) 400 MG tablet Take 1 tablet (400 mg total) by mouth 2 (two) times daily.  . ritonavir (NORVIR) 100 MG TABS tablet Take 1 tablet (100 mg total) by mouth daily.  . zidovudine (RETROVIR) 300 MG tablet Take 1 tablet (300 mg total) by mouth 2 (two) times daily.  . carvedilol (COREG) 25 MG tablet Take 2 tablets (50 mg total) by mouth 2 (two) times daily with a meal. (Patient not taking: Reported on 08/17/2014)  .  HYDROcodone-acetaminophen (NORCO) 10-325 MG per tablet Take 1 tablet by mouth every 8 (eight) hours as needed. (Patient not taking: Reported on 08/17/2014)  . [DISCONTINUED] nicotine (NICODERM CQ - DOSED IN MG/24 HOURS) 14 mg/24hr patch Place 1 patch (14 mg total) onto the skin daily.  . [DISCONTINUED] nicotine (NICODERM CQ - DOSED IN MG/24 HR) 7 mg/24hr patch Place 1 patch (7 mg total) onto the skin daily.  . [DISCONTINUED] oxyCODONE (ROXICODONE) 5 MG immediate release tablet Take 1 tablet (5 mg total) by mouth every 6 (six) hours as needed for severe pain.  . [DISCONTINUED] predniSONE (DELTASONE) 20 MG tablet 3 tabs po day one, then 2 tabs daily x 4 days     Patient Active Problem List   Diagnosis Date Noted  . Tooth pain 07/04/2014  . End stage renal disease 12/27/2013  . Drug noncompliance 11/07/2013  . History of syphilis 11/07/2013  . Arthritis 09/09/2013  . Tobacco abuse 09/07/2013  . Chronic pain disorder 04/28/2013  . Acute on chronic renal failure 09/17/2012  . HYPERTRIGLYCERIDEMIA 11/01/2009  . Gout 08/13/2009  . ERECTILE DYSFUNCTION 06/08/2008  . 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 HIV DISEASE 05/08/2006  . PERIPHERAL NEUROPATHY 05/08/2006  . Essential hypertension 05/08/2006  . SEIZURE DISORDER 05/08/2006     There are no preventive care reminders to display for this patient.   Review of Systems  Physical Exam   BP 188/99 mmHg  Pulse 103  Temp(Src) 98.4 F (36.9 C) (Oral)  Wt 222 lb (100.699 kg)  Lab Results  Component Value Date   CD4TCELL 32* 05/11/2014   Lab Results  Component Value Date   CD4TABS 440 05/11/2014   CD4TABS 490 10/20/2013   CD4TABS 560 07/18/2013   Lab Results  Component Value Date   HIV1RNAQUANT <20 05/11/2014   Lab Results  Component Value Date   HEPBSAB NO 09/14/2006   No results found for: RPR  CBC Lab Results  Component Value Date   WBC 6.3 12/18/2013   RBC 2.74* 12/18/2013   HGB 11.4* 07/07/2014   HCT 31.0* 12/18/2013     PLT 151 12/18/2013   MCV 102.6* 12/18/2013   MCH 35.0* 12/18/2013   MCHC 34.2 12/18/2013   RDW 13.8 12/18/2013   LYMPHSABS 1.2 12/18/2013   MONOABS 0.6 12/18/2013   EOSABS 0.1 12/18/2013   BASOSABS 0.0 12/18/2013   BMET Lab Results  Component Value Date   NA 133* 05/01/2014   K 4.8 05/01/2014   CL 95* 05/01/2014   CO2 18* 05/01/2014   GLUCOSE 116* 05/01/2014   BUN 184* 05/01/2014   CREATININE 8.20* 05/01/2014   CALCIUM 8.7 05/01/2014   GFRNONAA 7* 05/01/2014   GFRAA 8* 05/01/2014     Assessment and Plan  Headache = could be concominant from dental abscess vs. Hypertension. Spoke with dr. Moshe Cipro. We will make sure he restart cardura 8mg  tonight. Will defer to dr. Moshe Cipro if he needs prn clonidine 0.1mg   Dental abscess = will give course of amoxicillin and norco so that we can treat symptomatically. Attempt to get him dental appt on feb 10th, currently scheduled at feb 26th clinic  hiv = on salvage regimen, no geno to determine why he is on current regimen. It appears that RLG added when he had low level viremia, then has been supressed there after. Will check labs today. Anticipate to be able to simplify his regimen  Hypertension urgency = records suggest he has been at sbp in [place]. On repeat sbp 182/106. Anticipate that restarting cardura will be sufficient plus seeing dr. Moshe Cipro to discuss initiation of hd.  ckd 5 = meds adjusted for renal clearance. Also seeing nephro. Anticipate HD soon. Need to decrease lamivudine 50mg daily and zidovudine 300mg  daily  to renally adjust his meds

## 2014-08-18 LAB — HIV-1 RNA QUANT-NO REFLEX-BLD
HIV 1 RNA Quant: <20 copies/mL (ref <20)
HIV-1 RNA Quant, Log: <1.3 {Log} (ref <1.30)

## 2014-08-18 LAB — POCT HEMOGLOBIN-HEMACUE: Hemoglobin: 9.2 g/dL — ABNORMAL LOW (ref 13.0–17.0)

## 2014-08-18 LAB — URINE CYTOLOGY ANCILLARY ONLY
Chlamydia: NEGATIVE
Neisseria Gonorrhea: NEGATIVE

## 2014-08-18 LAB — RPR

## 2014-08-18 LAB — T-HELPER CELL (CD4) - (RCID CLINIC ONLY)
CD4 T CELL HELPER: 28 % — AB (ref 33–55)
CD4 T Cell Abs: 230 /uL — ABNORMAL LOW (ref 400–2700)

## 2014-08-18 LAB — HEPATITIS B SURFACE ANTIBODY,QUALITATIVE: HEP B S AB: POSITIVE — AB

## 2014-08-21 DIAGNOSIS — N184 Chronic kidney disease, stage 4 (severe): Secondary | ICD-10-CM | POA: Diagnosis not present

## 2014-08-21 DIAGNOSIS — D631 Anemia in chronic kidney disease: Secondary | ICD-10-CM | POA: Diagnosis not present

## 2014-08-21 DIAGNOSIS — N2581 Secondary hyperparathyroidism of renal origin: Secondary | ICD-10-CM | POA: Diagnosis not present

## 2014-08-21 DIAGNOSIS — I129 Hypertensive chronic kidney disease with stage 1 through stage 4 chronic kidney disease, or unspecified chronic kidney disease: Secondary | ICD-10-CM | POA: Diagnosis not present

## 2014-08-29 DIAGNOSIS — D631 Anemia in chronic kidney disease: Secondary | ICD-10-CM | POA: Diagnosis not present

## 2014-08-29 DIAGNOSIS — N2581 Secondary hyperparathyroidism of renal origin: Secondary | ICD-10-CM | POA: Diagnosis not present

## 2014-08-29 DIAGNOSIS — N186 End stage renal disease: Secondary | ICD-10-CM | POA: Diagnosis not present

## 2014-08-30 ENCOUNTER — Encounter: Payer: Self-pay | Admitting: Internal Medicine

## 2014-08-30 DIAGNOSIS — N189 Chronic kidney disease, unspecified: Secondary | ICD-10-CM

## 2014-08-30 DIAGNOSIS — D631 Anemia in chronic kidney disease: Secondary | ICD-10-CM | POA: Insufficient documentation

## 2014-08-31 ENCOUNTER — Encounter (HOSPITAL_COMMUNITY): Payer: Medicare Other

## 2014-08-31 DIAGNOSIS — N2581 Secondary hyperparathyroidism of renal origin: Secondary | ICD-10-CM | POA: Diagnosis not present

## 2014-08-31 DIAGNOSIS — D631 Anemia in chronic kidney disease: Secondary | ICD-10-CM | POA: Diagnosis not present

## 2014-08-31 DIAGNOSIS — N186 End stage renal disease: Secondary | ICD-10-CM | POA: Diagnosis not present

## 2014-09-02 DIAGNOSIS — N186 End stage renal disease: Secondary | ICD-10-CM | POA: Diagnosis not present

## 2014-09-02 DIAGNOSIS — N2581 Secondary hyperparathyroidism of renal origin: Secondary | ICD-10-CM | POA: Diagnosis not present

## 2014-09-02 DIAGNOSIS — D631 Anemia in chronic kidney disease: Secondary | ICD-10-CM | POA: Diagnosis not present

## 2014-09-05 DIAGNOSIS — N186 End stage renal disease: Secondary | ICD-10-CM | POA: Diagnosis not present

## 2014-09-05 DIAGNOSIS — D631 Anemia in chronic kidney disease: Secondary | ICD-10-CM | POA: Diagnosis not present

## 2014-09-05 DIAGNOSIS — N2581 Secondary hyperparathyroidism of renal origin: Secondary | ICD-10-CM | POA: Diagnosis not present

## 2014-09-07 DIAGNOSIS — N186 End stage renal disease: Secondary | ICD-10-CM | POA: Diagnosis not present

## 2014-09-07 DIAGNOSIS — N2581 Secondary hyperparathyroidism of renal origin: Secondary | ICD-10-CM | POA: Diagnosis not present

## 2014-09-07 DIAGNOSIS — D631 Anemia in chronic kidney disease: Secondary | ICD-10-CM | POA: Diagnosis not present

## 2014-09-09 DIAGNOSIS — N186 End stage renal disease: Secondary | ICD-10-CM | POA: Diagnosis not present

## 2014-09-09 DIAGNOSIS — D631 Anemia in chronic kidney disease: Secondary | ICD-10-CM | POA: Diagnosis not present

## 2014-09-09 DIAGNOSIS — N2581 Secondary hyperparathyroidism of renal origin: Secondary | ICD-10-CM | POA: Diagnosis not present

## 2014-09-12 DIAGNOSIS — D631 Anemia in chronic kidney disease: Secondary | ICD-10-CM | POA: Diagnosis not present

## 2014-09-12 DIAGNOSIS — N186 End stage renal disease: Secondary | ICD-10-CM | POA: Diagnosis not present

## 2014-09-12 DIAGNOSIS — N2581 Secondary hyperparathyroidism of renal origin: Secondary | ICD-10-CM | POA: Diagnosis not present

## 2014-09-13 ENCOUNTER — Telehealth: Payer: Self-pay | Admitting: *Deleted

## 2014-09-13 NOTE — Telephone Encounter (Signed)
Notified by Darrell Johnson that he is unable to refill his Epivir not sure why and wanted me to check on this for him. Called his pharmacy and was advised it is not on the formulary any longer (branded or generic). It is still available but the Darrell Johnson will have to pay $100 a month out of pocket for the medication. Because the Darrell Johnson has Medicare he can not use the Copay card and he can not afford the $100 advised the Darrell Johnson will ask the doctor if there is anything else he can take and give him a call back. Darrell Johnson advised will wait for the call. [name] is not an option either.

## 2014-09-14 DIAGNOSIS — N2581 Secondary hyperparathyroidism of renal origin: Secondary | ICD-10-CM | POA: Diagnosis not present

## 2014-09-14 DIAGNOSIS — D631 Anemia in chronic kidney disease: Secondary | ICD-10-CM | POA: Diagnosis not present

## 2014-09-14 DIAGNOSIS — N186 End stage renal disease: Secondary | ICD-10-CM | POA: Diagnosis not present

## 2014-09-15 NOTE — Telephone Encounter (Signed)
Can he meet with pam to figure out if he qualifiers for PAN [employer] or something on those lines

## 2014-09-16 DIAGNOSIS — D631 Anemia in chronic kidney disease: Secondary | ICD-10-CM | POA: Diagnosis not present

## 2014-09-16 DIAGNOSIS — N186 End stage renal disease: Secondary | ICD-10-CM | POA: Diagnosis not present

## 2014-09-16 DIAGNOSIS — N2581 Secondary hyperparathyroidism of renal origin: Secondary | ICD-10-CM | POA: Diagnosis not present

## 2014-09-18 ENCOUNTER — Other Ambulatory Visit: Payer: Self-pay | Admitting: *Deleted

## 2014-09-18 DIAGNOSIS — B20 Human immunodeficiency virus [HIV] disease: Secondary | ICD-10-CM

## 2014-09-18 DIAGNOSIS — N186 End stage renal disease: Secondary | ICD-10-CM | POA: Diagnosis not present

## 2014-09-18 DIAGNOSIS — Z992 Dependence on renal dialysis: Secondary | ICD-10-CM | POA: Diagnosis not present

## 2014-09-18 MED ORDER — LAMIVUDINE 100 MG PO TABS: 50.0000 mg | ORAL_TABLET | Freq: Every day | ORAL | Status: DC

## 2014-09-18 NOTE — Telephone Encounter (Signed)
Per Dr Baxter Flattery called the patient and left a message for him to call back and get an appt with Pam to see if PAN is an option prior to trying to switch as the medication is working for him.

## 2014-09-19 DIAGNOSIS — N186 End stage renal disease: Secondary | ICD-10-CM | POA: Diagnosis not present

## 2014-09-19 DIAGNOSIS — N2581 Secondary hyperparathyroidism of renal origin: Secondary | ICD-10-CM | POA: Diagnosis not present

## 2014-09-19 DIAGNOSIS — D631 Anemia in chronic kidney disease: Secondary | ICD-10-CM | POA: Diagnosis not present

## 2014-09-21 DIAGNOSIS — N2581 Secondary hyperparathyroidism of renal origin: Secondary | ICD-10-CM | POA: Diagnosis not present

## 2014-09-21 DIAGNOSIS — N186 End stage renal disease: Secondary | ICD-10-CM | POA: Diagnosis not present

## 2014-09-21 DIAGNOSIS — D631 Anemia in chronic kidney disease: Secondary | ICD-10-CM | POA: Diagnosis not present

## 2014-09-23 DIAGNOSIS — N2581 Secondary hyperparathyroidism of renal origin: Secondary | ICD-10-CM | POA: Diagnosis not present

## 2014-09-23 DIAGNOSIS — D631 Anemia in chronic kidney disease: Secondary | ICD-10-CM | POA: Diagnosis not present

## 2014-09-23 DIAGNOSIS — N186 End stage renal disease: Secondary | ICD-10-CM | POA: Diagnosis not present

## 2014-09-26 ENCOUNTER — Encounter (HOSPITAL_COMMUNITY): Payer: Self-pay | Admitting: Emergency Medicine

## 2014-09-26 ENCOUNTER — Inpatient Hospital Stay (HOSPITAL_COMMUNITY)
Admission: EM | Admit: 2014-09-26 | Discharge: 2014-09-30 | DRG: 917 | Disposition: A | Discharge status: Home / Self Care | Payer: Medicare Other | Attending: Infectious Disease | Admitting: Infectious Disease

## 2014-09-26 ENCOUNTER — Other Ambulatory Visit (HOSPITAL_COMMUNITY): Payer: Self-pay

## 2014-09-26 ENCOUNTER — Emergency Department (HOSPITAL_COMMUNITY): Payer: Medicare Other

## 2014-09-26 DIAGNOSIS — G629 Polyneuropathy, unspecified: Secondary | ICD-10-CM | POA: Diagnosis present

## 2014-09-26 DIAGNOSIS — T402X1A Poisoning by other opioids, accidental (unintentional), initial encounter: Principal | ICD-10-CM | POA: Diagnosis present

## 2014-09-26 DIAGNOSIS — Z9119 Patient's noncompliance with other medical treatment and regimen: Secondary | ICD-10-CM | POA: Diagnosis present

## 2014-09-26 DIAGNOSIS — I120 Hypertensive chronic kidney disease with stage 5 chronic kidney disease or end stage renal disease: Secondary | ICD-10-CM | POA: Diagnosis not present

## 2014-09-26 DIAGNOSIS — R509 Fever, unspecified: Secondary | ICD-10-CM

## 2014-09-26 DIAGNOSIS — I10 Essential (primary) hypertension: Secondary | ICD-10-CM | POA: Diagnosis not present

## 2014-09-26 DIAGNOSIS — M1A9XX0 Chronic gout, unspecified, without tophus (tophi): Secondary | ICD-10-CM

## 2014-09-26 DIAGNOSIS — J811 Chronic pulmonary edema: Secondary | ICD-10-CM

## 2014-09-26 DIAGNOSIS — R6510 Systemic inflammatory response syndrome (SIRS) of non-infectious origin without acute organ dysfunction: Secondary | ICD-10-CM | POA: Diagnosis not present

## 2014-09-26 DIAGNOSIS — G8929 Other chronic pain: Secondary | ICD-10-CM | POA: Diagnosis present

## 2014-09-26 DIAGNOSIS — J80 Acute respiratory distress syndrome: Secondary | ICD-10-CM | POA: Diagnosis not present

## 2014-09-26 DIAGNOSIS — J1008 Influenza due to other identified influenza virus with other specified pneumonia: Secondary | ICD-10-CM | POA: Diagnosis not present

## 2014-09-26 DIAGNOSIS — B59 Pneumocystosis: Secondary | ICD-10-CM | POA: Diagnosis not present

## 2014-09-26 DIAGNOSIS — N189 Chronic kidney disease, unspecified: Secondary | ICD-10-CM

## 2014-09-26 DIAGNOSIS — M109 Gout, unspecified: Secondary | ICD-10-CM | POA: Diagnosis present

## 2014-09-26 DIAGNOSIS — N186 End stage renal disease: Secondary | ICD-10-CM | POA: Diagnosis present

## 2014-09-26 DIAGNOSIS — J810 Acute pulmonary edema: Secondary | ICD-10-CM | POA: Diagnosis present

## 2014-09-26 DIAGNOSIS — E785 Hyperlipidemia, unspecified: Secondary | ICD-10-CM | POA: Diagnosis present

## 2014-09-26 DIAGNOSIS — Z7952 Long term (current) use of systemic steroids: Secondary | ICD-10-CM | POA: Diagnosis not present

## 2014-09-26 DIAGNOSIS — J101 Influenza due to other identified influenza virus with other respiratory manifestations: Secondary | ICD-10-CM | POA: Diagnosis not present

## 2014-09-26 DIAGNOSIS — E8770 Fluid overload, unspecified: Secondary | ICD-10-CM | POA: Diagnosis present

## 2014-09-26 DIAGNOSIS — F17210 Nicotine dependence, cigarettes, uncomplicated: Secondary | ICD-10-CM | POA: Diagnosis present

## 2014-09-26 DIAGNOSIS — Z992 Dependence on renal dialysis: Secondary | ICD-10-CM

## 2014-09-26 DIAGNOSIS — J9602 Acute respiratory failure with hypercapnia: Secondary | ICD-10-CM | POA: Diagnosis not present

## 2014-09-26 DIAGNOSIS — D72819 Decreased white blood cell count, unspecified: Secondary | ICD-10-CM | POA: Diagnosis not present

## 2014-09-26 DIAGNOSIS — Z79899 Other long term (current) drug therapy: Secondary | ICD-10-CM | POA: Diagnosis not present

## 2014-09-26 DIAGNOSIS — E872 Acidosis: Secondary | ICD-10-CM | POA: Diagnosis present

## 2014-09-26 DIAGNOSIS — N2581 Secondary hyperparathyroidism of renal origin: Secondary | ICD-10-CM | POA: Diagnosis present

## 2014-09-26 DIAGNOSIS — D649 Anemia, unspecified: Secondary | ICD-10-CM | POA: Diagnosis not present

## 2014-09-26 DIAGNOSIS — J9692 Respiratory failure, unspecified with hypercapnia: Secondary | ICD-10-CM | POA: Diagnosis not present

## 2014-09-26 DIAGNOSIS — R0602 Shortness of breath: Secondary | ICD-10-CM | POA: Diagnosis not present

## 2014-09-26 DIAGNOSIS — B20 Human immunodeficiency virus [HIV] disease: Secondary | ICD-10-CM | POA: Diagnosis present

## 2014-09-26 DIAGNOSIS — R0600 Dyspnea, unspecified: Secondary | ICD-10-CM | POA: Diagnosis present

## 2014-09-26 DIAGNOSIS — D631 Anemia in chronic kidney disease: Secondary | ICD-10-CM | POA: Diagnosis present

## 2014-09-26 DIAGNOSIS — J90 Pleural effusion, not elsewhere classified: Secondary | ICD-10-CM | POA: Diagnosis not present

## 2014-09-26 DIAGNOSIS — R0603 Acute respiratory distress: Secondary | ICD-10-CM | POA: Diagnosis present

## 2014-09-26 DIAGNOSIS — G894 Chronic pain syndrome: Secondary | ICD-10-CM | POA: Diagnosis not present

## 2014-09-26 DIAGNOSIS — E8779 Other fluid overload: Secondary | ICD-10-CM | POA: Diagnosis not present

## 2014-09-26 DIAGNOSIS — R918 Other nonspecific abnormal finding of lung field: Secondary | ICD-10-CM | POA: Diagnosis not present

## 2014-09-26 DIAGNOSIS — D61818 Other pancytopenia: Secondary | ICD-10-CM | POA: Diagnosis not present

## 2014-09-26 DIAGNOSIS — R0609 Other forms of dyspnea: Secondary | ICD-10-CM | POA: Diagnosis not present

## 2014-09-26 HISTORY — DX: Heart failure, unspecified: I50.9

## 2014-09-26 LAB — BLOOD GAS, ARTERIAL

## 2014-09-26 LAB — I-STAT CHEM 8, ED
BUN: 69 mg/dL — AB (ref 6–23)
Calcium, Ion: 1.16 mmol/L (ref 1.12–1.23)
Chloride: 105 mmol/L (ref 96–112)
Creatinine, Ser: 7.2 mg/dL — ABNORMAL HIGH (ref 0.50–1.35)
Glucose, Bld: 168 mg/dL — ABNORMAL HIGH (ref 70–99)
HCT: 36 % — ABNORMAL LOW (ref 39.0–52.0)
Hemoglobin: 12.2 g/dL — ABNORMAL LOW (ref 13.0–17.0)
Potassium: 5.5 mmol/L — ABNORMAL HIGH (ref 3.5–5.1)
Sodium: 139 mmol/L (ref 135–145)
TCO2: 23 mmol/L (ref 0–100)

## 2014-09-26 LAB — I-STAT ARTERIAL BLOOD GAS, ED
ACID-BASE DEFICIT: 3 mmol/L — AB (ref 0.0–2.0)
Acid-base deficit: 2 mmol/L (ref 0.0–2.0)
BICARBONATE: 27.6 meq/L — AB (ref 20.0–24.0)
Bicarbonate: 24.7 mEq/L — ABNORMAL HIGH (ref 20.0–24.0)
O2 SAT: 93 %
O2 SAT: 99 %
PH ART: 7.302 — AB (ref 7.350–7.450)
Patient temperature: 98.6
TCO2: 30 mmol/L (ref 0–100)
TCO2: 26 mmol/L (ref 0–100)
pCO2 arterial: 83 mmHg (ref 35.0–45.0)
pCO2 arterial: 50 mmHg — ABNORMAL HIGH (ref 35.0–45.0)
pH, Arterial: 7.131 — CL (ref 7.350–7.450)
pO2, Arterial: 90 mmHg (ref 80.0–100.0)
pO2, Arterial: 140 mmHg — ABNORMAL HIGH (ref 80.0–100.0)

## 2014-09-26 LAB — RENAL FUNCTION PANEL
Albumin: 3.8 g/dL (ref 3.5–5.2)
Anion gap: 23 — ABNORMAL HIGH (ref 5–15)
BUN: 68 mg/dL — AB (ref 6–23)
CHLORIDE: 105 mmol/L (ref 96–112)
CO2: 15 mmol/L — ABNORMAL LOW (ref 19–32)
CREATININE: 7.97 mg/dL — AB (ref 0.50–1.35)
Calcium: 9 mg/dL (ref 8.4–10.5)
GFR calc Af Amer: 8 mL/min — ABNORMAL LOW (ref >90)
GFR, EST NON AFRICAN AMERICAN: 7 mL/min — AB (ref >90)
Glucose, Bld: 135 mg/dL — ABNORMAL HIGH (ref 70–99)
Phosphorus: 5.9 mg/dL — ABNORMAL HIGH (ref 2.3–4.6)
Potassium: 6 mmol/L — ABNORMAL HIGH (ref 3.5–5.1)
Sodium: 143 mmol/L (ref 135–145)

## 2014-09-26 LAB — CBC WITH DIFFERENTIAL/PLATELET
BASOS PCT: 0 % (ref 0–1)
Basophils Absolute: 0 10*3/uL (ref 0.0–0.1)
Eosinophils Absolute: 0 10*3/uL (ref 0.0–0.7)
Eosinophils Relative: 0 % (ref 0–5)
HCT: 33.1 % — ABNORMAL LOW (ref 39.0–52.0)
HEMOGLOBIN: 10.8 g/dL — AB (ref 13.0–17.0)
LYMPHS ABS: 2.2 10*3/uL (ref 0.7–4.0)
Lymphocytes Relative: 18 % (ref 12–46)
MCH: 37.8 pg — AB (ref 26.0–34.0)
MCHC: 32.6 g/dL (ref 30.0–36.0)
MCV: 115.7 fL — AB (ref 78.0–100.0)
MONO ABS: 0.6 10*3/uL (ref 0.1–1.0)
Monocytes Relative: 5 % (ref 3–12)
Neutro Abs: 9.3 10*3/uL — ABNORMAL HIGH (ref 1.7–7.7)
Neutrophils Relative %: 77 % (ref 43–77)
Platelets: 163 10*3/uL (ref 150–400)
RBC: 2.86 MIL/uL — ABNORMAL LOW (ref 4.22–5.81)
RDW: 17.5 % — ABNORMAL HIGH (ref 11.5–15.5)
WBC: 12.1 10*3/uL — AB (ref 4.0–10.5)

## 2014-09-26 LAB — MRSA PCR SCREENING: MRSA by PCR: NEGATIVE

## 2014-09-26 LAB — CBG MONITORING, ED: Glucose-Capillary: 144 mg/dL — ABNORMAL HIGH (ref 70–99)

## 2014-09-26 LAB — I-STAT TROPONIN, ED: TROPONIN I, POC: 0.02 ng/mL (ref 0.00–0.08)

## 2014-09-26 MED ORDER — NEPRO/CARBSTEADY PO LIQD: 237.0000 mL | ORAL | Status: DC | PRN

## 2014-09-26 MED ORDER — ETOMIDATE 2 MG/ML IV SOLN
INTRAVENOUS | Status: AC
Start: 2014-09-26 — End: 2014-09-26
  Filled 2014-09-26: qty 20

## 2014-09-26 MED ORDER — ACETAMINOPHEN 650 MG RE SUPP: 650.0000 mg | Freq: Four times a day (QID) | RECTAL | Status: DC | PRN

## 2014-09-26 MED ORDER — SODIUM CHLORIDE 0.9 % IV SOLN
125.0000 mg | INTRAVENOUS | Status: DC
  Administered 2014-09-28: 125 mg via INTRAVENOUS
  Filled 2014-09-26 (×2): qty 10

## 2014-09-26 MED ORDER — DOXERCALCIFEROL 4 MCG/2ML IV SOLN
INTRAVENOUS | Status: AC
  Filled 2014-09-26: qty 2

## 2014-09-26 MED ORDER — HEPARIN SODIUM (PORCINE) 1000 UNIT/ML DIALYSIS
1000.0000 [IU] | INTRAMUSCULAR | Status: DC | PRN
  Filled 2014-09-26: qty 1

## 2014-09-26 MED ORDER — NITROGLYCERIN IN D5W 200-5 MCG/ML-% IV SOLN
INTRAVENOUS | Status: AC
  Administered 2014-09-26: 20 ug/min via INTRAVENOUS
  Filled 2014-09-26: qty 250

## 2014-09-26 MED ORDER — RITONAVIR 100 MG PO TABS: 100.0000 mg | ORAL_TABLET | Freq: Every day | ORAL | Status: DC

## 2014-09-26 MED ORDER — MONTELUKAST SODIUM 10 MG PO TABS
10.0000 mg | ORAL_TABLET | Freq: Every day | ORAL | Status: DC
  Administered 2014-09-26 – 2014-09-29 (×4): 10 mg via ORAL
  Filled 2014-09-26 (×5): qty 1

## 2014-09-26 MED ORDER — SODIUM CHLORIDE 0.9 % IJ SOLN
3.0000 mL | Freq: Two times a day (BID) | INTRAMUSCULAR | Status: DC
  Administered 2014-09-26 – 2014-09-29 (×6): 3 mL via INTRAVENOUS

## 2014-09-26 MED ORDER — RITONAVIR 100 MG PO TABS
100.0000 mg | ORAL_TABLET | Freq: Every day | ORAL | Status: DC
Start: 2014-09-26 — End: 2014-09-30
  Administered 2014-09-26 – 2014-09-30 (×4): 100 mg via ORAL
  Filled 2014-09-26 (×5): qty 1

## 2014-09-26 MED ORDER — LAMIVUDINE 150 MG PO TABS
150.0000 mg | ORAL_TABLET | Freq: Once | ORAL | Status: AC
  Administered 2014-09-26: 150 mg via ORAL
  Filled 2014-09-26: qty 1

## 2014-09-26 MED ORDER — SUCCINYLCHOLINE CHLORIDE 20 MG/ML IJ SOLN
INTRAMUSCULAR | Status: AC
  Filled 2014-09-26: qty 1

## 2014-09-26 MED ORDER — PREDNISONE 5 MG PO TABS
5.0000 mg | ORAL_TABLET | Freq: Every day | ORAL | Status: DC
  Administered 2014-09-27 – 2014-09-28 (×2): 5 mg via ORAL
  Filled 2014-09-26 (×4): qty 1

## 2014-09-26 MED ORDER — NITROGLYCERIN IN D5W 200-5 MCG/ML-% IV SOLN
5.0000 ug/min | Freq: Once | INTRAVENOUS | Status: AC
  Administered 2014-09-26: 20 ug/min via INTRAVENOUS

## 2014-09-26 MED ORDER — HEPARIN SODIUM (PORCINE) 5000 UNIT/ML IJ SOLN
5000.0000 [IU] | Freq: Three times a day (TID) | INTRAMUSCULAR | Status: DC
  Administered 2014-09-26 – 2014-09-28 (×5): 5000 [IU] via SUBCUTANEOUS
  Filled 2014-09-26 (×8): qty 1

## 2014-09-26 MED ORDER — DARUNAVIR ETHANOLATE 800 MG PO TABS
800.0000 mg | ORAL_TABLET | Freq: Every day | ORAL | Status: DC
  Filled 2014-09-26: qty 1

## 2014-09-26 MED ORDER — LIDOCAINE-PRILOCAINE 2.5-2.5 % EX CREA: 1.0000 "application " | TOPICAL_CREAM | CUTANEOUS | Status: DC | PRN

## 2014-09-26 MED ORDER — DARUNAVIR ETHANOLATE 800 MG PO TABS
800.0000 mg | ORAL_TABLET | Freq: Every day | ORAL | Status: DC
  Administered 2014-09-27 – 2014-09-29 (×3): 800 mg via ORAL
  Filled 2014-09-26 (×5): qty 1

## 2014-09-26 MED ORDER — LIDOCAINE HCL (PF) 1 % IJ SOLN: 5.0000 mL | INTRAMUSCULAR | Status: DC | PRN

## 2014-09-26 MED ORDER — SODIUM CHLORIDE 0.9 % IV SOLN: 100.0000 mL | INTRAVENOUS | Status: DC | PRN

## 2014-09-26 MED ORDER — DARBEPOETIN ALFA 100 MCG/0.5ML IJ SOSY
100.0000 ug | PREFILLED_SYRINGE | INTRAMUSCULAR | Status: DC
  Administered 2014-09-28: 100 ug via INTRAVENOUS
  Filled 2014-09-26: qty 0.5

## 2014-09-26 MED ORDER — PENTAFLUOROPROP-TETRAFLUOROETH EX AERO: 1.0000 "application " | INHALATION_SPRAY | CUTANEOUS | Status: DC | PRN

## 2014-09-26 MED ORDER — ACETAMINOPHEN 325 MG PO TABS
650.0000 mg | ORAL_TABLET | Freq: Four times a day (QID) | ORAL | Status: DC | PRN
  Administered 2014-09-26 – 2014-09-28 (×5): 650 mg via ORAL
  Filled 2014-09-26 (×5): qty 2

## 2014-09-26 MED ORDER — DOXAZOSIN MESYLATE 8 MG PO TABS
8.0000 mg | ORAL_TABLET | Freq: Every day | ORAL | Status: DC
  Administered 2014-09-26 – 2014-09-29 (×4): 8 mg via ORAL
  Filled 2014-09-26 (×5): qty 1

## 2014-09-26 MED ORDER — CALCITRIOL 0.5 MCG PO CAPS
0.5000 ug | ORAL_CAPSULE | Freq: Every day | ORAL | Status: DC
  Administered 2014-09-26 – 2014-09-28 (×3): 0.5 ug via ORAL
  Filled 2014-09-26 (×4): qty 1

## 2014-09-26 MED ORDER — RALTEGRAVIR POTASSIUM 400 MG PO TABS: 400.0000 mg | ORAL_TABLET | Freq: Two times a day (BID) | ORAL | Status: DC

## 2014-09-26 MED ORDER — HEPARIN SODIUM (PORCINE) 1000 UNIT/ML DIALYSIS: 6000.0000 [IU] | Freq: Once | INTRAMUSCULAR | Status: DC

## 2014-09-26 MED ORDER — RENA-VITE PO TABS
1.0000 | ORAL_TABLET | Freq: Every day | ORAL | Status: DC
  Administered 2014-09-26 – 2014-09-29 (×4): 1 via ORAL
  Filled 2014-09-26 (×5): qty 1

## 2014-09-26 MED ORDER — LAMIVUDINE 10 MG/ML PO SOLN
50.0000 mg | Freq: Every day | ORAL | Status: DC
  Administered 2014-09-27 – 2014-09-30 (×4): 50 mg via ORAL
  Filled 2014-09-26 (×4): qty 5

## 2014-09-26 MED ORDER — LAMIVUDINE 100 MG PO TABS: 50.0000 mg | ORAL_TABLET | Freq: Every day | ORAL | Status: DC

## 2014-09-26 MED ORDER — ALTEPLASE 2 MG IJ SOLR: 2.0000 mg | Freq: Once | INTRAMUSCULAR | Status: AC | PRN

## 2014-09-26 MED ORDER — NALOXONE HCL 0.4 MG/ML IJ SOLN: 0.4000 mg | Freq: Once | INTRAMUSCULAR | Status: DC

## 2014-09-26 MED ORDER — ZIDOVUDINE 300 MG PO TABS: 300.0000 mg | ORAL_TABLET | Freq: Every day | ORAL | Status: DC

## 2014-09-26 MED ORDER — FEBUXOSTAT 80 MG PO TABS: 80.0000 mg | ORAL_TABLET | Freq: Every day | ORAL | Status: DC

## 2014-09-26 MED ORDER — LIDOCAINE HCL (CARDIAC) 20 MG/ML IV SOLN
INTRAVENOUS | Status: AC
  Filled 2014-09-26: qty 5

## 2014-09-26 MED ORDER — FEBUXOSTAT 40 MG PO TABS
80.0000 mg | ORAL_TABLET | Freq: Every day | ORAL | Status: DC
  Administered 2014-09-26 – 2014-09-30 (×5): 80 mg via ORAL
  Filled 2014-09-26 (×5): qty 2

## 2014-09-26 MED ORDER — CARVEDILOL 25 MG PO TABS
50.0000 mg | ORAL_TABLET | Freq: Two times a day (BID) | ORAL | Status: DC
  Filled 2014-09-26 (×3): qty 2

## 2014-09-26 MED ORDER — RALTEGRAVIR POTASSIUM 400 MG PO TABS
400.0000 mg | ORAL_TABLET | Freq: Two times a day (BID) | ORAL | Status: DC
  Administered 2014-09-26 – 2014-09-29 (×7): 400 mg via ORAL
  Filled 2014-09-26 (×9): qty 1

## 2014-09-26 MED ORDER — ROCURONIUM BROMIDE 50 MG/5ML IV SOLN
INTRAVENOUS | Status: AC
Start: 2014-09-26 — End: 2014-09-26
  Filled 2014-09-26: qty 2

## 2014-09-26 MED ORDER — DOXERCALCIFEROL 4 MCG/2ML IV SOLN
3.0000 ug | INTRAVENOUS | Status: DC
  Administered 2014-09-26 – 2014-09-30 (×4): 3 ug via INTRAVENOUS
  Filled 2014-09-26 (×2): qty 2

## 2014-09-26 MED ORDER — ONDANSETRON HCL 4 MG/2ML IJ SOLN: 4.0000 mg | Freq: Three times a day (TID) | INTRAMUSCULAR | Status: DC | PRN

## 2014-09-26 MED ORDER — ZIDOVUDINE 100 MG PO CAPS
300.0000 mg | ORAL_CAPSULE | Freq: Every day | ORAL | Status: DC
  Administered 2014-09-26 – 2014-09-30 (×5): 300 mg via ORAL
  Filled 2014-09-26 (×5): qty 3

## 2014-09-26 NOTE — Consult Note (Signed)
Indication for Consultation:  Management of ESRD/hemodialysis; anemia, hypertension/volume and secondary hyperparathyroidism  HPI: Darrell Johnson is a 49 y.o. male who presented to the ED this am with complaints of sob which began this AM. 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 He reports he 'stopped his fluid pill last week' and has not been compliant with fluid restriction. He reports feeling well last night but when he woke up was sob so he came to the ED. He recently started HD about a month ago, followed with Dr Moshe Cipro previously. Will arranage for HD now.   Past Medical History  Diagnosis Date  . 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 Hypertension   . Seizures     last seizure was >5 years ago, pt has family history of seizures  . Syphilis 1997    history of syphilis 1997  . Chronic kidney disease     stage 3-4 CKD, followed by Dr. Moshe Cipro  . 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 Rib fractures 01/2009  . HIV infection 1980's    on ART therapy since, followed by ID clinic, complicated  by neuropathy  . 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 Pneumonia   . Arthritis   . Anemia   . HYPERTENSION 05/08/2006  . Gout, unspecified 08/13/2009    Qualifier: Diagnosis of  By: Redmond Pulling  MD, Mateo Flow     Past Surgical History  Procedure Laterality Date  . Abscess drainage    . Av fistula placement Right 12/02/2013    Procedure: ARTERIOVENOUS (AV) FISTULA CREATION;  Surgeon: Rosetta Posner, MD;  Location: [facility] OR;  Service: Vascular;  Laterality: Right;   Family History  Problem Relation Age of Onset  . Cancer Mother   . COPD Father   . Diabetes Sister   . Hypertension Sister   . Diabetes Brother   . Hypertension Brother   . Stroke Neg Hx     Social History:  Currently smoking a half ppd- trying to quit  reports that he has been smoking Cigarettes.  He has a 5 pack-year smoking history. He has never used smokeless tobacco. He reports that he does not drink alcohol or use illicit drugs. No Known Allergies Prior to Admission medications   Medication Sig Start Date End Date Taking? Authorizing Provider  calcitRIOL (ROCALTROL) 0.5 MCG capsule Take 0.5 mcg by mouth daily.   Yes Historical Provider, MD  carvedilol (COREG) 25 MG tablet Take 2 tablets (50 mg total) by mouth 2 (two) times daily with a meal. 06/07/14  Yes Tasrif Ahmed, MD  Darunavir Ethanolate (PREZISTA) 800 MG tablet Take 1 tablet (800 mg total) by mouth daily with breakfast. 05/09/14  Yes Carlyle Basques, MD  doxazosin (CARDURA) 8 MG tablet Take 8 mg by mouth at bedtime.   Yes Historical Provider, MD  Febuxostat (ULORIC) 80 MG TABS Take 80 mg by mouth daily.   Yes Historical Provider, MD  furosemide (LASIX) 80 MG tablet Take 160 mg by mouth 2 (two) times daily.   Yes Historical Provider, MD  HYDROcodone-acetaminophen (NORCO) 10-325 MG per tablet Take 1 tablet by mouth every 8 (eight) hours as needed. 08/17/14  Yes Carlyle Basques, MD  lamivudine (EPIVIR) 100 MG tablet Take 0.5 tablets (50  mg total) by mouth daily. Due to renal function. 09/18/14  Yes Carlyle Basques, MD  montelukast (SINGULAIR) 10 MG tablet Take 10 mg by mouth 2 (two) times daily. 11/03/13  Yes Carlyle Basques, MD  predniSONE (DELTASONE) 5 MG tablet Take 5 mg by mouth daily with breakfast.   Yes Historical Provider, MD  raltegravir (ISENTRESS) 400 MG tablet Take 1 tablet (400 mg total) by mouth 2 (two) times daily. 05/09/14  Yes Carlyle Basques, MD  ritonavir (NORVIR) 100 MG TABS tablet Take 1 tablet (100 mg total) by mouth daily. 05/09/14  Yes Carlyle Basques, MD  zidovudine (RETROVIR) 300 MG tablet Take 1 tablet (300 mg total) by mouth daily. 08/17/14  Yes Carlyle Basques, MD  amoxicillin (AMOXIL) 500 MG tablet  Take 1 tablet (500 mg total) by mouth daily. Patient not taking: Reported on 09/26/2014 08/17/14   Carlyle Basques, MD  furosemide (LASIX) 40 MG tablet Take 120 mg by mouth 2 (two) times daily.     Historical Provider, MD  naproxen (NAPROSYN) 500 MG tablet Take 1 tablet (500 mg total) by mouth 2 (two) times daily. Patient not taking: Reported on 09/26/2014 04/01/14   Margarita Mail, PA-C   Current Facility-Administered Medications  Medication Dose Route Frequency Provider Last Rate Last Dose  . etomidate (AMIDATE) 2 MG/ML injection           . lidocaine (cardiac) 100 mg/68ml (XYLOCAINE) 20 MG/ML injection 2%           . rocuronium (ZEMURON) 50 MG/5ML injection           . succinylcholine (ANECTINE) 20 MG/ML injection            Current Outpatient Prescriptions  Medication Sig Dispense Refill  . calcitRIOL (ROCALTROL) 0.5 MCG capsule Take 0.5 mcg by mouth daily.    . carvedilol (COREG) 25 MG tablet Take 2 tablets (50 mg total) by mouth 2 (two) times daily with a meal. 120 tablet 11  . Darunavir Ethanolate (PREZISTA) 800 MG tablet Take 1 tablet (800 mg total) by mouth daily with breakfast. 30 tablet 5  . doxazosin (CARDURA) 8 MG tablet Take 8 mg by mouth at bedtime.    . Febuxostat (ULORIC) 80 MG TABS Take 80 mg by mouth daily.    . furosemide (LASIX) 80 MG tablet Take 160 mg by mouth 2 (two) times daily.    Marland Kitchen HYDROcodone-acetaminophen (NORCO) 10-325 MG per tablet Take 1 tablet by mouth every 8 (eight) hours as needed. 30 tablet 0  . lamivudine (EPIVIR) 100 MG tablet Take 0.5 tablets (50 mg total) by mouth daily. Due to renal function. 15 tablet 5  . montelukast (SINGULAIR) 10 MG tablet Take 10 mg by mouth 2 (two) times daily.    . predniSONE (DELTASONE) 5 MG tablet Take 5 mg by mouth daily with breakfast.    . raltegravir (ISENTRESS) 400 MG tablet Take 1 tablet (400 mg total) by mouth 2 (two) times daily. 60 tablet 5  . ritonavir (NORVIR) 100 MG TABS tablet Take 1 tablet (100 mg total) by mouth  daily. 30 tablet 5  . zidovudine (RETROVIR) 300 MG tablet Take 1 tablet (300 mg total) by mouth daily. 30 tablet 11  . amoxicillin (AMOXIL) 500 MG tablet Take 1 tablet (500 mg total) by mouth daily. (Patient not taking: Reported on 09/26/2014) 7 tablet 0  . furosemide (LASIX) 40 MG tablet Take 120 mg by mouth 2 (two) times daily.     . naproxen (NAPROSYN) 500 MG tablet  Take 1 tablet (500 mg total) by mouth 2 (two) times daily. (Patient not taking: Reported on 09/26/2014) 30 tablet 0   Labs: Basic Metabolic Panel:  Recent Labs Lab 09/26/14 1116  NA 139  K 5.5*  CL 105  GLUCOSE 168*  BUN 69*  CREATININE 7.20*   Liver Function Tests: No results for input(s): AST, ALT, ALKPHOS, BILITOT, PROT, ALBUMIN in the last 168 hours. No results for input(s): LIPASE, AMYLASE in the last 168 hours. No results for input(s): AMMONIA in the last 168 hours. CBC:  Recent Labs Lab 09/26/14 1116  HGB 12.2*  HCT 36.0*   Cardiac Enzymes: No results for input(s): CKTOTAL, CKMB, CKMBINDEX, TROPONINI in the last 168 hours. CBG:  Recent Labs Lab 09/26/14 1149  GLUCAP 144*   Iron Studies: No results for input(s): IRON, TIBC, TRANSFERRIN, FERRITIN in the last 72 hours. Studies/Results: Dg Chest Port 1 View  09/26/2014   CLINICAL DATA:  49 year old male cyst with shortness of breath and hypoxia.  EXAM: PORTABLE CHEST - 1 VIEW  COMPARISON:  Prior chest x-ray 12/02/2013  FINDINGS: Increased bilateral interstitial and airspace opacities throughout both lungs most consistent with pulmonary edema. Borderline cardiomegaly. Chronic blunting of the right costophrenic angle new similar compared to prior. No acute osseous abnormality. No pneumothorax.  IMPRESSION: 1. Borderline cardiomegaly and pulmonary edema. Differential considerations include congestive heart failure and volume overload given the history of end-stage renal disease on hemodialysis. 2. Stable chronic blunting of the right costophrenic angle. A small  pleural effusion is difficult to exclude radiographically.   Electronically Signed   By: Jacqulynn Cadet M.D.   On: 09/26/2014 12:00    Review of Systems: Negative except for SOB which began this AM.  Denies chest pain  Physical Exam: Filed Vitals:   09/26/14 1345 09/26/14 1400 09/26/14 1415 09/26/14 1433  BP: 118/71 137/62 123/71   Pulse: 92 91 91 88  Resp: 27 26 30 25   SpO2: 96% 95% 97%      General: Well developed, well nourished, mild distress Head: Normocephalic, atraumatic, sclera non-icteric, mucus membranes are moist Neck: Supple. JVD not elevated. Lungs: Tachypneic . bilat rales. Labored  Heart: RRR with S1 S2. No murmurs, rubs, or gallops appreciated. Abdomen: Soft, obese. non-tender, non-distended with normoactive bowel sounds. No rebound/guarding. No obvious abdominal masses. M-S:  Strength and tone appear normal for age. Lower extremities: +1 LE edema Neuro: Alert and oriented X 3. Moves all extremities spontaneously. Psych:  Responds to questions appropriately with a normal affect. Dialysis Access:  R AVF +b.t  Dialysis Orders:  TTS south 4 hrs    104.5kgs   2k/2.5Ca+  6000u heparin/3000 mid Aranesp 100 q week   venofer 100mg  q week  hectorol 3   Assessment/Plan: 1.  Resp distress-  Pulm edema/Vol overload- HD now. Drug screen pending. Noncompliance w fluid restriction 2.  ESRD -  TTS south K+ 5.5 3.  Hypertension/volume  - BP 130/74 SBP >200 in ED. Nitro gtt. Home meds cardura and carvedilol. edw needs to be slowly lowered- HD again tomorrow for volume 4.  Anemia  - hgb 12.2- cont weekly ESA and Fe 5.  Metabolic bone disease -  Cont hectorol. Last PTH 940/phos 5.6 6.  Nutrition - NPO- will need renal diet. supplements  7. HIV- cont home meds 8. Current smoker  Shelle Iron, NP [employer] [phone] 09/26/2014, 2:36 PM   Pt seen, examined and agree w A/P as above.  Kelly Splinter MD pager [phone]  cell [phone] 09/26/2014, 4:32  PM

## 2014-09-26 NOTE — ED Notes (Signed)
Pt. arrived to D-36 in Moderate to Severe distress, is new Renal Dialysis pt., placed on BiPAP@1105  with Medium FFM, BUR-14/38-pt.,I-18/E-8/60% achieving volumes of 570 cc, tolerating well, planning to obtain ABG after at least X 20 minutes to better determine status, RT to monitor.

## 2014-09-26 NOTE — H&P (Signed)
Date: 09/26/2014               Patient Name:  Darrell Johnson MRN: [id number]  DOB: [birth year] Age / Sex: 49 y.o., male   PCP: Dellia Nims, MD         Medical Service: Internal Medicine Teaching Service         Attending Physician: Dr. Truman Hayward, MD    First Contact: Fritzi Mandes MS4 Pager: [phone]  Second Contact: Dr. Joni Reining Pager: [phone]       After Hours (After 5p/  First Contact Pager: [phone]  weekends / holidays): Second Contact Pager: [phone]   Chief Complaint: SOB  History of Present Illness: Darrell Johnson is a 49 yo F with PMH of ESRD recently started on HD (TTS) within the past month, HIV (last vL undect, CD4 230), HTN, and chronic pain who presents with a 1 day history of SOB.  In the ED initial blood work was significant for an ABG which revealed an acute respiratory acidosis with hypercapnia.  He reports that last night he started coughing and not feeling well.  He and his wife note that he has been more tired lately which he attributes to moving furniture around his house.  His wife ultimately decided to bring him in to the ED this morning as he continued to "not feel well" and he was very somnolent.  Of note he denies any recent medication changes.  He is presribed hydrocodone for chronic pain but initially denied any increase in pain medication (he later told my attending Dr. Tommy Medal that he had borrowed some oxycodone for dental pain recently).  He does have a history of seizures but is not currently on seizure medications, he denies any recent seizure activity. He also admits that he has not been taking his lasix.  In the ED he was placed on BiPAP with improvement of his respiratory status.  He was also placed on a nitro drip for his hypertension. Meds: Current Facility-Administered Medications  Medication Dose Route Frequency Provider Last Rate Last Dose  . 0.9 %  sodium chloride infusion  100 mL Intravenous PRN Shelle Iron, NP      . 0.9 %   sodium chloride infusion  100 mL Intravenous PRN Shelle Iron, NP      . alteplase (CATHFLO ACTIVASE) injection 2 mg  2 mg Intracatheter Once PRN Shelle Iron, NP      . Derrill Memo ON 09/28/2014] Darbepoetin Alfa (ARANESP) injection 100 mcg  100 mcg Intravenous Q Thu-HD Shelle Iron, NP      . doxercalciferol (HECTOROL) injection 3 mcg  3 mcg Intravenous Q T,Th,Sa-HD Shelle Iron, NP      . etomidate (AMIDATE) 2 MG/ML injection           . feeding supplement (NEPRO CARB STEADY) liquid 237 mL  237 mL Oral PRN Shelle Iron, NP      . Derrill Memo ON 09/28/2014] ferric gluconate (NULECIT) 125 mg in sodium chloride 0.9 % 100 mL IVPB  125 mg Intravenous Q Thu-HD Shelle Iron, NP      . heparin injection 1,000 Units  1,000 Units Dialysis PRN Shelle Iron, NP      . heparin injection 6,000 Units  6,000 Units Dialysis Once in dialysis Shelle Iron, NP      . lidocaine (cardiac) 100 mg/41ml (XYLOCAINE) 20 MG/ML injection 2%           . lidocaine (PF) (XYLOCAINE) 1 % injection 5  mL  5 mL Intradermal PRN Shelle Iron, NP      . lidocaine-prilocaine (EMLA) cream 1 application  1 application Topical PRN Shelle Iron, NP      . multivitamin (RENA-VIT) tablet 1 tablet  1 tablet Oral QHS Shelle Iron, NP      . pentafluoroprop-tetrafluoroeth (GEBAUERS) aerosol 1 application  1 application Topical PRN Shelle Iron, NP      . rocuronium (ZEMURON) 50 MG/5ML injection           . succinylcholine (ANECTINE) 20 MG/ML injection             Allergies: Allergies as of 09/26/2014  . (No Known Allergies)   Past Medical History  Diagnosis Date  . 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 Hypertension   . Seizures     last seizure was >5 years ago, pt has family history of seizures  . Syphilis 1997    history of syphilis 1997  . Chronic kidney disease     stage 3-4 CKD, followed by Dr. Moshe Cipro  . Sexually transmitted disease     gonorrhea and  trichomonas, penile condylomata - s/p circu,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 that was the reason for her at all as if she is a  . Rib fractures 01/2009  . HIV infection 1980's    on ART therapy since, followed by ID clinic, complicated  by neuropathy  . 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 Pneumonia   . Arthritis   . Anemia   . HYPERTENSION 05/08/2006  . Gout, unspecified 08/13/2009    Qualifier: Diagnosis of  By: Redmond Pulling  MD, Mateo Flow     Past Surgical History  Procedure Laterality Date  . Abscess drainage    . Av fistula placement Right 12/02/2013    Procedure: ARTERIOVENOUS (AV) FISTULA CREATION;  Surgeon: Rosetta Posner, MD;  Location: [facility];  Service: Vascular;  Laterality: Right;   Family History  Problem Relation Age of Onset  . Cancer Mother   . COPD Father   . Diabetes Sister   . Hypertension Sister   . Diabetes Brother   . Hypertension Brother   . Stroke Neg Hx    History   Social History  . Marital Status: Married    Spouse Name: N/A  . Number of Children: N/A  . Years of Education: N/A   Occupational History  . Not on file.   Social History Main Topics  . Smoking status: Current Every Day Smoker -- 0.25 packs/day for 20 years    Types: Cigarettes  . Smokeless tobacco: Never Used     Comment: decreasing.  . Alcohol Use: No  . Drug Use: No  . Sexual Activity: Not on file     Comment: given condoms   Other Topics Concern  . Not on file   Social History Narrative    Review of Systems: Review of Systems  Constitutional: Positive for malaise/fatigue. Negative for fever and chills.  HENT: Negative for hearing loss.   Eyes: Negative for blurred vision and photophobia.  Respiratory: Positive for cough and shortness of breath.   Cardiovascular: Positive for leg swelling. Negative for chest pain.  Gastrointestinal: Negative for abdominal pain and constipation.  Genitourinary: Negative for dysuria.  Neurological: Positive for headaches. Negative for  dizziness.  Psychiatric/Behavioral: Negative for depression and substance abuse.     Physical Exam: Blood pressure 125/64, pulse 89, resp. rate 28, SpO2 91 %. Physical Exam  Constitutional:  Very lethargic on bipap  HENT:  Head: Normocephalic and atraumatic.  Neck:  No JVD noted  Cardiovascular: Normal rate, regular rhythm, normal heart sounds and intact distal pulses.   Right avf with +bruit and trill  Pulmonary/Chest: Effort normal. He has rales (bibasilar).  Abdominal: Soft. Bowel sounds are normal. He exhibits no distension. There is no tenderness.  obese  Musculoskeletal: He exhibits edema (1+ bilateral).  Skin: Skin is warm.  Nursing note and vitals reviewed.    Lab results: Basic Metabolic Panel:  Recent Labs  09/26/14 1116  NA 139  K 5.5*  CL 105  GLUCOSE 168*  BUN 69*  CREATININE 7.20*   Liver Function Tests: No results for input(s): AST, ALT, ALKPHOS, BILITOT, PROT, ALBUMIN in the last 72 hours. No results for input(s): LIPASE, AMYLASE in the last 72 hours. No results for input(s): AMMONIA in the last 72 hours. CBC:  Recent Labs  09/26/14 1116  HGB 12.2*  HCT 36.0*   Cardiac Enzymes: No results for input(s): CKTOTAL, CKMB, CKMBINDEX, TROPONINI in the last 72 hours. BNP: No results for input(s): PROBNP in the last 72 hours. D-Dimer: No results for input(s): DDIMER in the last 72 hours. CBG:  Recent Labs  09/26/14 1149  GLUCAP 144*   Hemoglobin A1C: No results for input(s): HGBA1C in the last 72 hours. Fasting Lipid Panel: No results for input(s): CHOL, HDL, LDLCALC, TRIG, CHOLHDL, LDLDIRECT in the last 72 hours. Thyroid Function Tests: No results for input(s): TSH, T4TOTAL, FREET4, T3FREE, THYROIDAB in the last 72 hours. Anemia Panel: No results for input(s): VITAMINB12, FOLATE, FERRITIN, TIBC, IRON, RETICCTPCT in the last 72 hours. Coagulation: No results for input(s): LABPROT, INR in the last 72 hours. Urine Drug Screen: Drugs of  Abuse     Component Value Date/Time   LABOPIA POSITIVE* 07/06/2013 1808   LABOPIA PPS 11/12/2012 1155   COCAINSCRNUR NONE DETECTED 07/06/2013 1808   COCAINSCRNUR NEG 11/12/2012 1155   LABBENZ NONE DETECTED 07/06/2013 1808   LABBENZ NEG 11/12/2012 1155   AMPHETMU NONE DETECTED 07/06/2013 1808   AMPHETMU NEG 11/12/2012 1155   THCU NONE DETECTED 07/06/2013 1808   THCU NEG 11/12/2012 1155   LABBARB NONE DETECTED 07/06/2013 1808   LABBARB NEG 11/12/2012 1155    Alcohol Level: No results for input(s): ETH in the last 72 hours. Urinalysis: No results for input(s): COLORURINE, LABSPEC, PHURINE, GLUCOSEU, HGBUR, BILIRUBINUR, KETONESUR, PROTEINUR, UROBILINOGEN, NITRITE, LEUKOCYTESUR in the last 72 hours.  Invalid input(s): APPERANCEUR   Imaging results:  Dg Chest Port 1 View  09/26/2014   CLINICAL DATA:  49 year old male cyst with shortness of breath and hypoxia.  EXAM: PORTABLE CHEST - 1 VIEW  COMPARISON:  Prior chest x-ray 12/02/2013  FINDINGS: Increased bilateral interstitial and airspace opacities throughout both lungs most consistent with pulmonary edema. Borderline cardiomegaly. Chronic blunting of the right costophrenic angle new similar compared to prior. No acute osseous abnormality. No pneumothorax.  IMPRESSION: 1. Borderline cardiomegaly and pulmonary edema. Differential considerations include congestive heart failure and volume overload given the history of end-stage renal disease on hemodialysis. 2. Stable chronic blunting of the right costophrenic angle. A small pleural effusion is difficult to exclude radiographically.   Electronically Signed   By: Jacqulynn Cadet M.D.   On: 09/26/2014 12:00    Other results:  Assessment & Plan by Problem:   Acute respiratory failure with hypercapnia - Patient presented with acute respiratory failure and found to have an acute respiratory acidosis.  He is ESRD on dialysis  and has evidence of pulmonary edema on CXR.  However this should not  cause his significant hypercapnia.  He has no history of COPD and no wheezing on exam, no history of OSA, he is obese making OHS a possibility however it does not appear he has been hypercapnic in the past from previous bicarb readings on BMPs. - Continue BiPAP - HD today to pull off fluid - To go now for HD, if delay will give trial of narcan as I suspect he may have take too much pain medication causing a decrease in respiratory drive.    Human immunodeficiency virus (HIV) disease - vL undect, will continue his outpatient regimen    Essential hypertension - BP currently well controlled, will try to wean off NTG drip - Resume home medications    Chronic pain disorder -Hold pain medications at this time.  May need to restart but will do so with care.  ESRD - Renal function panel - Nephrology on board for dialysis today - Continue out patient phos binders  Hx Gout: -Continue prednisone 5mg  - Continue Urloric  Diet: renal DVT: [place] Hep Code: Full Dispo: Disposition is deferred at this time, awaiting improvement of current medical problems. Anticipated discharge in approximately 2 day(s).   The patient does have a current PCP (Tasrif Ahmed, MD) and does not need an [facility] hospital follow-up appointment after discharge.  The patient does not have transportation limitations that hinder transportation to clinic appointments.  Signed: Lucious Groves, DO 09/26/2014, 3:31 PM

## 2014-09-26 NOTE — H&P (Signed)
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 Last night, wife states that patient began coughing and seemed tired. He spent the day moving furniture so they attributed it to his increased workload and went to bed. This morning, pt was somnolent and stated he still "didn't feel well" so wife brought him to the ED. Denies fever, chills. No chest pain. Denies changes in medications. Patient is on opiods for chronic pain but has not changed doses recently.  Pt is on T, R, Sat dialysis. Denies missing any sessions, though he is due for dialysis today. Endorses compliance with medications.  He is a 1/2 ppd smoker x 35 years though no signs of COPD. No OSA. Does not use oxygen at home.   On arrival to the ED, ABG showed respiratory acidosis with pH 7.1 and HCO2 27. Pt was satting 88% on room air, 92% on 3L [place]. Pt continued to be tachypneic and required increasing O2 so was converted to bipap. Pt was also hypertensive to 222/109 on arrival.  Endorsed headache. He was started on a nitro drip with appropriate decrease in BP.   Objective: Vital signs in last 24 hours: Filed Vitals:   09/26/14 1300 09/26/14 1345 09/26/14 1400 09/26/14 1415  BP: 119/62 118/71 137/62 123/71  Pulse: 99 92 91 91  Resp: 27 27 26 30   SpO2: 100% 96% 95% 97%   Weight change:  No intake or output data in the 24 hours ending 09/26/14 1420 BP 130/74 mmHg  Pulse 88  Resp 25  SpO2 91% General appearance: lethargic Darrell Johnson, on bipap, arousable  Neck: no JVD Lungs: ronchi to bases bilaterally Heart: regular rate and rhythm, S1, S2 normal, no murmur, click, rub or gallop Abdomen: soft, non-tender; bowel sounds normal; no masses,  no organomegaly, obese, protruberant  Extremities: extremities normal, atraumatic, no cyanosis or edema   Lab Results: Basic Metabolic Panel:  Recent  Labs  09/26/14 1116  NA 139  K 5.5*  CL 105  GLUCOSE 168*  BUN 69*  CREATININE 7.20*   Liver Function Tests: No results for input(s): AST, ALT, ALKPHOS, BILITOT, PROT, ALBUMIN in the last 72 hours. No results for input(s): LIPASE, AMYLASE in the last 72 hours. No results for input(s): AMMONIA in the last 72 hours. CBC:  Recent Labs  09/26/14 1116  HGB 12.2*  HCT 36.0*   Cardiac Enzymes: No results for input(s): CKTOTAL, CKMB, CKMBINDEX, TROPONINI in the last 72 hours. BNP: No results for input(s): PROBNP in the last 72 hours. D-Dimer: No results for input(s): DDIMER in the last 72 hours. CBG:  Recent Labs  09/26/14 1149  GLUCAP 144*   Hemoglobin A1C: No results for input(s): HGBA1C in the last 72 hours. Fasting Lipid Panel: No results for input(s): CHOL, HDL, LDLCALC, TRIG, CHOLHDL, LDLDIRECT in the last 72 hours. Thyroid Function Tests: No results for input(s): TSH, T4TOTAL, FREET4, T3FREE, THYROIDAB in the last 72 hours. Anemia Panel: No results for input(s): VITAMINB12, FOLATE, FERRITIN, TIBC, IRON, RETICCTPCT in the last 72 hours. Coagulation: No results for input(s): LABPROT, INR in the last 72 hours. Urine Drug Screen: Drugs of Abuse     Component Value Date/Time   LABOPIA POSITIVE* 07/06/2013 1808   LABOPIA PPS 11/12/2012 1155   COCAINSCRNUR NONE DETECTED 07/06/2013 1808   COCAINSCRNUR NEG 11/12/2012 1155  LABBENZ NONE DETECTED 07/06/2013 1808   LABBENZ NEG 11/12/2012 1155   AMPHETMU NONE DETECTED 07/06/2013 1808   AMPHETMU NEG 11/12/2012 1155   THCU NONE DETECTED 07/06/2013 1808   THCU NEG 11/12/2012 1155   LABBARB NONE DETECTED 07/06/2013 1808   LABBARB NEG 11/12/2012 1155    Alcohol Level: No results for input(s): ETH in the last 72 hours. Urinalysis: No results for input(s): COLORURINE, LABSPEC, PHURINE, GLUCOSEU, HGBUR, BILIRUBINUR, KETONESUR, PROTEINUR, UROBILINOGEN, NITRITE, LEUKOCYTESUR in the last 72 hours.  Invalid input(s):  APPERANCEUR Misc. Labs:   Micro Results: No results found for this or any previous visit (from the past 240 hour(s)). Studies/Results: Dg Chest Port 1 View  09/26/2014   CLINICAL DATA:  49 year old male cyst with shortness of breath and hypoxia.  EXAM: PORTABLE CHEST - 1 VIEW  COMPARISON:  Prior chest x-ray 12/02/2013  FINDINGS: Increased bilateral interstitial and airspace opacities throughout both lungs most consistent with pulmonary edema. Borderline cardiomegaly. Chronic blunting of the right costophrenic angle new similar compared to prior. No acute osseous abnormality. No pneumothorax.  IMPRESSION: 1. Borderline cardiomegaly and pulmonary edema. Differential considerations include congestive heart failure and volume overload given the history of end-stage renal disease on hemodialysis. 2. Stable chronic blunting of the right costophrenic angle. A small pleural effusion is difficult to exclude radiographically.   Electronically Signed   By: Jacqulynn Cadet M.D.   On: 09/26/2014 12:00   Medications:  Calcitriol 0.5 mcg Coreg 25 BID Doxazosin 8 mg uloric 80 mg  Lasix 120 mg BID norco 10-325 TID Lamivudine 0.5 mg Montelukast 10 mg Prednisone 5 mg  raltegravir 400 mg BID Ritonavir 100 mg zidovuudine 300 mg prezista 800 mg  Assessment/Plan: Active Problems:   Acute pulmonary edema  Darrell Johnson is a 49 year old Darrell Johnson with ESRD on dialysis (T, R, Sat) , HTN, HIV, and chronic pain on opiods who presents with a one day history of SOB.   Acute pulmonary edema, respiratory acidosis and hypercapnea: On arrival to the ED, Pt was tachypneic, tachycardic and lethargic. ABG showed respiratory acidosis with pH 7.1 and HCO2 27. Pt was satting 88% on room air, 92% on 3L [place]. Now satting 100% on bipap with improvement in ABG to 7.3/ 50. Persistently tachypneic. CXR shows pulmonary edema.  Consider PE due to sudden onset SOB, tachypnea, tachycardial; however patient does not have any risk factors for  DVT. Consider fluid overload as patient is due for dialysis. However, he has not missed a session and is compliant with medications, also no peripheral edema. Consider overdose on opiod pain medications or buildup of metabolites. Pt takes norco 10-325 TID for chronic pain.  Infection is a consideration as the patient is HIV positive. However, pt is afebrile, no chills.  - Narcan for possible opioid overdose - Emergent dialysis today  - Continue bipap -  UTox - CBC - EKG - Will hold on PE workup for now as it is lower on the differential    Hypertension: Pt was hypertensive to 222/109 on arrival to ED. Endorsed headache. He was started on a nitro drip in ED with appropriate decrease in BP.  - d/c nitro drip - continue home meds  - labetalol or hydral prn  - continue to monitor   ESRD Patient on dialysis Tuesday, Thursday, Saturday. Endorses compliance with regimen and medications. Has not missed a session. Due for session today.  - Renal function panel  - Hemodialysis today  - Consider nephrology consult  HIV, chronic infection:  Regimvir 400 mg BID, Ritonavir 100 mg, zidovudine 300 mg, and prezista 800 mg. Last CD4 count in 07/2014 was 230.  Viral load undetectable in 04/2014.  - Continue home meds  Chronic pain: Pt is on norco 10-325 mg TID for chronic pain. - continue home medications  Tobacco Abuse: Pt smokes 1/2 ppd x 35 years.  - Nicotine patch 10g   FENGI - clear liquid diet  DVT PPx - lovenox   This is a Careers information officer Note.  The care of the patient was discussed with Dr. Heber Vinings and the assessment and plan formulated with their assistance.  Please see their attached note for official documentation of the daily encounter.     Fritzi Mandes, Med Student 09/26/2014, 2:20 PM

## 2014-09-26 NOTE — ED Notes (Signed)
Pt here with c/o sob , pt is on dialysis and is due today , pt  sats 88 % on room air  Pt 92 % on 3 liters

## 2014-09-26 NOTE — ED Provider Notes (Signed)
The patient is a 49 year old male, recently started dialysis within the last month, goes Tuesday Thursdays and Saturdays, has not missed dialysis until today when he was feeling poorly and decided to come here instead of dialysis. He reports severe, constant and worsening shortness of breath, started yesterday, he is barely able to speak in one or 2 word sentences, level V caveat apply secondary to severe respiratory distress. On exam the patient is tachypneic, diaphoretic, has diffuse wheezing and rales, has peripheral edema in his lower extremities, is somnolent but arousable to voice, able to move all 4 extremities but very very weak.  The patient has an EKG showing no acute ischemia, chest x-ray will be ordered to rule out pulmonary edema, BiPAP to support his respiratory status, labs, obesity the patient will need to be admitted to the hospital, at this time his respiratory status is key, we'll try BiPAP, may need intubation if fails, oxygen 86% on high flow nonrebreather.  ED ECG REPORT  I personally interpreted this EKG   Date: 09/26/2014   Rate: 95  Rhythm: normal sinus rhythm  QRS Axis: normal  Intervals: normal  ST/T Wave abnormalities: peaked T waves   Conduction Disutrbances:none  Narrative Interpretation:   Old EKG Reviewed: none available  The patient has had ongoing respiratory distress, he is requiring BiPAP and a nitroglycerin drip for severe hypertension, discussed with Dr. Posey Pronto with nephrology who will arrange dialysis, discussed with the hospitalist who will admit.  CRITICAL CARE Performed by: Johnna Acosta Total critical care time: 35 Critical care time was exclusive of separately billable procedures and treating other patients. Critical care was necessary to treat or prevent imminent or life-threatening deterioration. Critical care was time spent personally by me on the following activities: development of treatment plan with patient and/or surrogate as well as nursing,  discussions with consultants, evaluation of patient's response to treatment, examination of patient, obtaining history from patient or surrogate, ordering and performing treatments and interventions, ordering and review of laboratory studies, ordering and review of radiographic studies, pulse oximetry and re-evaluation of patient's condition.   Medical screening examination/treatment/procedure(s) were conducted as a shared visit with non-physician practitioner(s) and myself.  I personally evaluated the patient during the encounter.  Clinical Impression:   Final diagnoses:  Acute pulmonary edema  Severe hypertension  Respiratory distress         Noemi Chapel, MD 09/26/14 [id number]

## 2014-09-26 NOTE — Procedures (Signed)
I was present at this dialysis session, have reviewed the session itself and made  appropriate changes  Kelly Splinter MD (pgr) [phone]    ([phone] 09/26/2014, 4:30 PM

## 2014-09-26 NOTE — H&P (Signed)
  Date: 09/26/2014  Patient name: Darrell Johnson  Medical record number: [id number]  Date of birth: 01-23-1966   I have seen and evaluated Darrell Johnson and discussed their care with the Residency Team.    49 year old AA male with HIV (well controlled) CKD on HD (being evaluated for transplant), chronic pain admitted with sudden onset of SOB. He was coughing, fatigued which he attributed to over exerting himself. He had become sleepy and wife had brought him to ED. He was found to be with hypercapnic respiratory failure also with hypoxemia and pulmonary edema on CXR. He was placed on BIPAP with improvement in ABG and then on HD with some improvement in his oxygenation. He told me that he was out of narcotics but endorsed taking vicodin from his wifes rx for tooth pain he had been suffering from.   Per our ID clinic notes there has been problem for him obtaining paying for his Epivir due to [employer] no longer having funds for ARV if I understand correctly. I did not address this with him today because I was not initially aware of this when pt was admitted.   Exam:  AOX 3 HEENT: [place], op clear CV: tachycardic no mgr Pulm: tachypneic, anteriorly fairly clear GI: moving abdomen at times with breath but denied being SOB EXT: 2+ edema Neuro Nonfocal  Assessment and Plan: I have seen and evaluated the patient as outlined above. I agree with the formulated Assessment and Plan as detailed in the residents' admission note, with the following changes:    #1 Hypercapnic + hypoxemic respiratory failure: our thoughts were initially that patients hypecpaneic respiratory failure was likely due to opioid ingestion in setting of CKD. He apparently did not miss HD so not clear why sudden volume overload pulmonary edema. Perhaps component of CHF, I thought we had urine tox screen but this was from past year  --would watch closely and low threshold for repeat ABG --consider narcan --I dont think PE  is likely as does not fit well with this clinical presentation with his Pulmonary edema etc --uncontrolled HTN could also be playing a role here  --would check Echo  HIV:  He is supposed to be on   Isentress 400mg  BID Prezista 800mg  boosted with Norvir 100mg  Along with  AZT 300mg  daily   And Renally dosed Epivir  (the latter he was having trouble obtaining due to cost)  I would consider erring on the side of high dose Epivir as outpatient in case this would solve his issue of copay> It would NOT be proper renal dose but toxicity from excess Epivir is not going to be an issue in my opinion  Would recheck HIV RNA and CD4 keeping in mind inpatient hospital tendency to mishandle the HIV RNA testing and could get falsely elevated VL  Would consider change from RAL BID (isentress) To Dolutegravir (TIVICAY) 50mg  daily for dosing simplicity       Truman Hayward, MD 3/8/20167:14 PM

## 2014-09-26 NOTE — ED Notes (Signed)
Pt. taken off BiPAP upon arrival to Renal Dialysis for trial/possible drink of H20, placed on 2 lpm n/c by RN, Bipap left @ bedside with X phone numbers if/when needed to go back on, RN aware @ bedside, RT to monitor

## 2014-09-26 NOTE — ED Provider Notes (Signed)
CSN: [id number]     Arrival date & time 09/26/14  1045 History   First MD Initiated Contact with Patient 09/26/14 1056     Chief Complaint  Patient presents with  . Shortness of Breath     (Consider location/radiation/quality/duration/timing/severity/associated sxs/prior Treatment) Patient is a 49 y.o. male presenting with shortness of breath. The history is provided by the patient. No language interpreter was used.  Shortness of Breath Mr. Troup is a 49 y.o. male who presents for shortness of breath that began yesterday.  He is new dialysis patient. He has dialysis on Tuesday, Thursday, and Saturday but did not go today because of his sob. He is in severe respiratory distress.  He has had no treatment prior to arrival.    Past Medical History  Diagnosis Date  . 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 Hypertension   . Seizures     last seizure was >5 years ago, pt has family history of seizures  . Syphilis 1997    history of syphilis 1997  . Chronic kidney disease     stage 3-4 CKD, followed by Dr. Moshe Cipro  . 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 Rib fractures 01/2009  . HIV infection 1980's    on ART therapy since, followed by ID clinic, complicated  by neuropathy  . 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 Pneumonia   . Arthritis   . Anemia   . HYPERTENSION 05/08/2006  . Gout, unspecified 08/13/2009    Qualifier: Diagnosis of  By: Redmond Pulling  MD, Mateo Flow     Past Surgical History  Procedure Laterality Date  . Abscess drainage    . Av fistula placement Right 12/02/2013    Procedure: ARTERIOVENOUS (AV) FISTULA CREATION;  Surgeon: Rosetta Posner, MD;  Location: [facility] OR;  Service: Vascular;  Laterality: Right;   Family History  Problem Relation Age of Onset  . Cancer Mother   . COPD Father   .  Diabetes Sister   . Hypertension Sister   . Diabetes Brother   . Hypertension Brother   . Stroke Neg Hx    History  Substance Use Topics  . Smoking status: Current Every Day Smoker -- 0.25 packs/day for 20 years    Types: Cigarettes  . Smokeless tobacco: Never Used     Comment: decreasing.  . Alcohol Use: No    Review of Systems  Respiratory: Positive for shortness of breath.       Allergies  Review of patient's allergies indicates no known allergies.  Home Medications   Prior to Admission medications   Medication Sig Start Date End Date Taking? Authorizing Provider  calcitRIOL (ROCALTROL) 0.5 MCG capsule Take 0.5 mcg by mouth daily.   Yes Historical Provider, MD  carvedilol (COREG) 25 MG tablet Take 2 tablets (50 mg total) by mouth 2 (two) times daily with a meal. 06/07/14  Yes Tasrif Ahmed, MD  Darunavir Ethanolate (PREZISTA) 800 MG tablet Take 1 tablet (800 mg total) by mouth daily with breakfast. 05/09/14  Yes Carlyle Basques, MD  doxazosin (CARDURA) 8 MG tablet Take 8 mg by mouth at bedtime.   Yes Historical Provider, MD  Febuxostat (ULORIC) 80 MG TABS Take 80 mg by mouth daily.   Yes Historical Provider, MD  furosemide (LASIX) 80 MG tablet Take 160  mg by mouth 2 (two) times daily.   Yes Historical Provider, MD  HYDROcodone-acetaminophen (NORCO) 10-325 MG per tablet Take 1 tablet by mouth every 8 (eight) hours as needed. 08/17/14  Yes Carlyle Basques, MD  lamivudine (EPIVIR) 100 MG tablet Take 0.5 tablets (50 mg total) by mouth daily. Due to renal function. 09/18/14  Yes Carlyle Basques, MD  montelukast (SINGULAIR) 10 MG tablet Take 10 mg by mouth 2 (two) times daily. 11/03/13  Yes Carlyle Basques, MD  predniSONE (DELTASONE) 5 MG tablet Take 5 mg by mouth daily with breakfast.   Yes Historical Provider, MD  raltegravir (ISENTRESS) 400 MG tablet Take 1 tablet (400 mg total) by mouth 2 (two) times daily. 05/09/14  Yes Carlyle Basques, MD  ritonavir (NORVIR) 100 MG TABS tablet Take 1  tablet (100 mg total) by mouth daily. 05/09/14  Yes Carlyle Basques, MD  zidovudine (RETROVIR) 300 MG tablet Take 1 tablet (300 mg total) by mouth daily. 08/17/14  Yes Carlyle Basques, MD  amoxicillin (AMOXIL) 500 MG tablet Take 1 tablet (500 mg total) by mouth daily. Patient not taking: Reported on 09/26/2014 08/17/14   Carlyle Basques, MD  furosemide (LASIX) 40 MG tablet Take 120 mg by mouth 2 (two) times daily.     Historical Provider, MD  naproxen (NAPROSYN) 500 MG tablet Take 1 tablet (500 mg total) by mouth 2 (two) times daily. Patient not taking: Reported on 09/26/2014 04/01/14   Margarita Mail, PA-C   BP 144/81 mmHg  Pulse 96  Resp 28  SpO2 91% Physical Exam  Constitutional: He appears well-developed and well-nourished. He appears distressed.  HENT:  Head: Normocephalic and atraumatic.  Neck: Normal range of motion. Neck supple.  Cardiovascular: Regular rhythm and normal heart sounds.  Tachycardia present.   Pulmonary/Chest: He is in respiratory distress. He has wheezes. He has rales.  Abdominal: Soft. There is no tenderness.  Musculoskeletal: Normal range of motion.  Bilateral lower extremity edema.   Neurological:  He is sleepy but arousable.  He follows commands and is able to open his eyes.    Skin: Skin is warm. He is diaphoretic.  Nursing note and vitals reviewed.   ED Course  Procedures (including critical care time) Labs Review Labs Reviewed  BLOOD GAS, ARTERIAL - Abnormal; Notable for the following:    Allens test (pass/fail) TEST WILL BE CREDITED (*)    All other components within normal limits  I-STAT CHEM 8, ED - Abnormal; Notable for the following:    Potassium 5.5 (*)    BUN 69 (*)    Creatinine, Ser 7.20 (*)    Glucose, Bld 168 (*)    Hemoglobin 12.2 (*)    HCT 36.0 (*)    All other components within normal limits  CBG MONITORING, ED - Abnormal; Notable for the following:    Glucose-Capillary 144 (*)    All other components within normal limits  I-STAT  ARTERIAL BLOOD GAS, ED - Abnormal; Notable for the following:    pH, Arterial 7.131 (*)    pCO2 arterial 83.0 (*)    Bicarbonate 27.6 (*)    Acid-base deficit 3.0 (*)    All other components within normal limits  I-STAT ARTERIAL BLOOD GAS, ED - Abnormal; Notable for the following:    pH, Arterial 7.302 (*)    pCO2 arterial 50.0 (*)    pO2, Arterial 140.0 (*)    Bicarbonate 24.7 (*)    All other components within normal limits  URINE RAPID DRUG SCREEN (HOSP  PERFORMED)  RENAL FUNCTION PANEL  CBC WITH DIFFERENTIAL/PLATELET  Randolm Idol, ED    Imaging Review Dg Chest Port 1 View  09/26/2014   CLINICAL DATA:  49 year old male cyst with shortness of breath and hypoxia.  EXAM: PORTABLE CHEST - 1 VIEW  COMPARISON:  Prior chest x-ray 12/02/2013  FINDINGS: Increased bilateral interstitial and airspace opacities throughout both lungs most consistent with pulmonary edema. Borderline cardiomegaly. Chronic blunting of the right costophrenic angle new similar compared to prior. No acute osseous abnormality. No pneumothorax.  IMPRESSION: 1. Borderline cardiomegaly and pulmonary edema. Differential considerations include congestive heart failure and volume overload given the history of end-stage renal disease on hemodialysis. 2. Stable chronic blunting of the right costophrenic angle. A small pleural effusion is difficult to exclude radiographically.   Electronically Signed   By: Jacqulynn Cadet M.D.   On: 09/26/2014 12:00     MDM   Final diagnoses:  Acute pulmonary edema  Severe hypertension  Respiratory distress   The patient is in respiratory distress and has been placed on bipap. He has severe hypertension and chest xray shows pulmonary edema and volume overload. We are watching is respirations and oxygen stats.  He may need intubation. His i-stat potassium is 5.5. I-stat troponin is .02, creatinine is 7.2. ABG's show that he is acidotic.  He will need dialysis.   Nephrology, Dr. Posey Pronto was  consulted regarding the patient.   12:45 I spoke to the resident, Dr. Heber [place], who agrees to admit the patient to step down.    Ottie Glazier, PA-C 09/26/14 1759  Ottie Glazier, PA-C 09/26/14 1800  Noemi Chapel, MD 09/26/14 [phone]

## 2014-09-27 ENCOUNTER — Inpatient Hospital Stay (HOSPITAL_COMMUNITY): Payer: Medicare Other

## 2014-09-27 DIAGNOSIS — R0603 Acute respiratory distress: Secondary | ICD-10-CM | POA: Diagnosis present

## 2014-09-27 DIAGNOSIS — R0600 Dyspnea, unspecified: Secondary | ICD-10-CM | POA: Diagnosis present

## 2014-09-27 MED ORDER — SODIUM CHLORIDE 0.9 % IV SOLN: 100.0000 mL | INTRAVENOUS | Status: DC | PRN

## 2014-09-27 MED ORDER — PENTAFLUOROPROP-TETRAFLUOROETH EX AERO
1.0000 | INHALATION_SPRAY | CUTANEOUS | Status: DC | PRN
Start: 2014-09-27 — End: 2014-09-27

## 2014-09-27 MED ORDER — HEPARIN SODIUM (PORCINE) 1000 UNIT/ML DIALYSIS
6000.0000 [IU] | Freq: Once | INTRAMUSCULAR | Status: DC
  Filled 2014-09-27: qty 6

## 2014-09-27 MED ORDER — LIDOCAINE HCL (PF) 1 % IJ SOLN: 5.0000 mL | INTRAMUSCULAR | Status: DC | PRN

## 2014-09-27 MED ORDER — CARVEDILOL 25 MG PO TABS
25.0000 mg | ORAL_TABLET | Freq: Two times a day (BID) | ORAL | Status: DC
  Administered 2014-09-27 – 2014-09-30 (×4): 25 mg via ORAL
  Filled 2014-09-27 (×8): qty 1

## 2014-09-27 MED ORDER — NEPRO/CARBSTEADY PO LIQD: 237.0000 mL | ORAL | Status: DC | PRN

## 2014-09-27 MED ORDER — HEPARIN SODIUM (PORCINE) 1000 UNIT/ML DIALYSIS: 1000.0000 [IU] | INTRAMUSCULAR | Status: DC | PRN

## 2014-09-27 MED ORDER — LIDOCAINE-PRILOCAINE 2.5-2.5 % EX CREA
1.0000 "application " | TOPICAL_CREAM | CUTANEOUS | Status: DC | PRN
  Filled 2014-09-27: qty 5

## 2014-09-27 MED ORDER — PENTAFLUOROPROP-TETRAFLUOROETH EX AERO: 1.0000 "application " | INHALATION_SPRAY | CUTANEOUS | Status: DC | PRN

## 2014-09-27 MED ORDER — ALTEPLASE 2 MG IJ SOLR: 2.0000 mg | Freq: Once | INTRAMUSCULAR | Status: DC | PRN

## 2014-09-27 MED ORDER — ALTEPLASE 2 MG IJ SOLR
2.0000 mg | Freq: Once | INTRAMUSCULAR | Status: AC | PRN
  Filled 2014-09-27: qty 2

## 2014-09-27 MED ORDER — HEPARIN SODIUM (PORCINE) 1000 UNIT/ML DIALYSIS
1000.0000 [IU] | INTRAMUSCULAR | Status: DC | PRN
  Filled 2014-09-27: qty 1

## 2014-09-27 MED ORDER — LIDOCAINE-PRILOCAINE 2.5-2.5 % EX CREA: 1.0000 "application " | TOPICAL_CREAM | CUTANEOUS | Status: DC | PRN

## 2014-09-27 MED ORDER — HEPARIN SODIUM (PORCINE) 1000 UNIT/ML DIALYSIS
4500.0000 [IU] | INTRAMUSCULAR | Status: DC | PRN
  Filled 2014-09-27: qty 5

## 2014-09-27 NOTE — Progress Notes (Signed)
Pt in room resting comfortably in bed. Report given to Tai RN.

## 2014-09-27 NOTE — Progress Notes (Signed)
  [facility] Progress Note   Subjective: feels much better, breathing good. 2.5kg off w HD yest, due to cramping  Filed Vitals:   09/27/14 0300 09/27/14 0600 09/27/14 0905 09/27/14 1202  BP:  157/70 166/83 166/81  Pulse:  97 101 99  Temp:   99.7 F (37.6 C)   TempSrc:   Oral   Resp:  20 13 22   Height:      Weight: 105.4 kg (232 lb 5.8 oz)     SpO2:  98% 98% 95%   Exam: General: Well developed, well nourished, mild distress Neck: Supple. JVD not elevated. Lungs: mostly clear bilat, occ rales L base Heart: RRR with S1 S2 Abdomen: Soft, obese. non-tender, non-distended Ext: +1 LE edema Neuro: Alert and oriented X 3. Moves all extremities spontaneously. Dialysis Access: R AVF +b.t  Dialysis Orders: [facility] 4 hrs 104.5kgs 2k/2.5Ca+ 6000u heparin/3000 mid Aranesp 100 q week venofer 100mg  q week hectorol 3   Assessment/Plan: 1. Resp distress / pulm edema - better clinically.  Repeat cxr today. HD in am 2. ESRD - TTS south 3. Hypertension/volume - BP better , on home meds 4. Anemia - hgb 12.2- cont weekly ESA and Fe 5. Metabolic bone disease - Cont hectorol. Last PTH 940/phos 5.6 6. Nutrition - NPO- will need renal diet. supplements  7. HIV- cont home meds 8. Current smoker   Plan- HD first shift in am, repeat xray today    Kelly Splinter MD  pager [phone]    cell [phone]  09/27/2014, 1:20 PM     Recent Labs Lab 09/26/14 1110 09/26/14 1116  NA 143 139  K 6.0* 5.5*  CL 105 105  CO2 15*  --   GLUCOSE 135* 168*  BUN 68* 69*  CREATININE 7.97* 7.20*  CALCIUM 9.0  --   PHOS 5.9*  --     Recent Labs Lab 09/26/14 1110  ALBUMIN 3.8    Recent Labs Lab 09/26/14 1110 09/26/14 1116  WBC 12.1*  --   NEUTROABS 9.3*  --   HGB 10.8* 12.2*  HCT 33.1* 36.0*  MCV 115.7*  --   PLT 163  --    . calcitRIOL  0.5 mcg Oral Daily  . carvedilol  50 mg Oral BID WC  . [START ON 09/28/2014] darbepoetin (ARANESP) injection - DIALYSIS  100  mcg Intravenous Q Thu-HD  . Darunavir Ethanolate  800 mg Oral Q breakfast  . doxazosin  8 mg Oral QHS  . doxercalciferol  3 mcg Intravenous Q T,Th,Sa-HD  . febuxostat  80 mg Oral Daily  . [START ON 09/28/2014] ferric gluconate (FERRLECIT/NULECIT) IV  125 mg Intravenous Q Thu-HD  . heparin  5,000 Units Subcutaneous 3 times per day  . lamiVUDine  50 mg Oral Daily  . montelukast  10 mg Oral QHS  . multivitamin  1 tablet Oral QHS  . predniSONE  5 mg Oral Q breakfast  . raltegravir  400 mg Oral BID  . ritonavir  100 mg Oral Q supper  . sodium chloride  3 mL Intravenous Q12H  . zidovudine  300 mg Oral Daily     acetaminophen **OR** acetaminophen, feeding supplement (NEPRO CARB STEADY)

## 2014-09-27 NOTE — Progress Notes (Signed)
Subjective: [name]. Patient is significantly improved from last night.  He is much more alert and oriented today. Sitting up and talking.  Had one session of dialysis with 2L fluid removed. Nephrology wants to do another session today. Has been weaned off bipap to 4L [place]. Today, patient admits to taking 3 vicodin and robitussin on Monday night to "ward off a cold". This information fits with his presentation on Tuesday morning with somnolence and respiratory depression likely secondary to opioid overdose. Pt also states that nephrologist discontinued his lasix last week because he has started on dialysis. Pt is concerned about fluid overload since he is no longer taking the lasix.   Objective: Vital signs in last 24 hours: Filed Vitals:   09/27/14 0200 09/27/14 0300 09/27/14 0600 09/27/14 0905  BP: 146/64  157/70 166/83  Pulse: 95  97 101  Temp:    99.7 F (37.6 C)  TempSrc:    Oral  Resp: 18  20 13   Height:      Weight:  105.4 kg (232 lb 5.8 oz)    SpO2: 98%  98% 98%   Weight change:   Intake/Output Summary (Last 24 hours) at 09/27/14 1005 Last data filed at 09/27/14 0906  Gross per 24 hour  Intake    120 ml  Output   2705 ml  Net  -2585 ml   BP 166/83 mmHg  Pulse 101  Temp(Src) 99.7 F (37.6 C) (Oral)  Resp 13  Ht 5\' 10"  (1.778 m)  Wt 105.4 kg (232 lb 5.8 oz)  BMI 33.34 kg/m2  SpO2 98% General appearance: alert, cooperative and no distress Neck: no JVD Lungs: CTAB, no wheezes  Heart: RRR, no murmurs, gallops, rubs appreciated Abdomen: obese, nontender, non distended, NABS Extremities: extremities normal, atraumatic, no cyanosis or edema ; RUE with AV fistula   Lab Results: Basic Metabolic Panel:  Recent Labs  09/26/14 1110 09/26/14 1116  NA 143 139  K 6.0* 5.5*  CL 105 105  CO2 15*  --   GLUCOSE 135* 168*  BUN 68* 69*  CREATININE 7.97* 7.20*  CALCIUM 9.0  --   PHOS 5.9*  --    Liver Function Tests:  Recent Labs  09/26/14 1110  ALBUMIN 3.8   No  results for input(s): LIPASE, AMYLASE in the last 72 hours. No results for input(s): AMMONIA in the last 72 hours. CBC:  Recent Labs  09/26/14 1110 09/26/14 1116  WBC 12.1*  --   NEUTROABS 9.3*  --   HGB 10.8* 12.2*  HCT 33.1* 36.0*  MCV 115.7*  --   PLT 163  --    CBG:  Recent Labs  09/26/14 1149  GLUCAP 144*   Urine Drug Screen: Drugs of Abuse     Component Value Date/Time   LABOPIA POSITIVE* 07/06/2013 1808   LABOPIA PPS 11/12/2012 1155   COCAINSCRNUR NONE DETECTED 07/06/2013 1808   COCAINSCRNUR NEG 11/12/2012 1155   LABBENZ NONE DETECTED 07/06/2013 1808   LABBENZ NEG 11/12/2012 1155   AMPHETMU NONE DETECTED 07/06/2013 1808   AMPHETMU NEG 11/12/2012 1155   THCU NONE DETECTED 07/06/2013 1808   THCU NEG 11/12/2012 1155   LABBARB NONE DETECTED 07/06/2013 1808   LABBARB NEG 11/12/2012 1155     Micro Results: Recent Results (from the past 240 hour(s))  MRSA PCR Screening     Status: None   Collection Time: 09/26/14  9:30 PM  Result Value Ref Range Status   MRSA by PCR NEGATIVE NEGATIVE Final  Comment:        The GeneXpert MRSA Assay (FDA approved for NASAL specimens only), is one component of a comprehensive MRSA colonization surveillance program. It is not intended to diagnose MRSA infection nor to guide or monitor treatment for MRSA infections.    Studies/Results: Dg Chest Port 1 View  09/26/2014   CLINICAL DATA:  49 year old male cyst with shortness of breath and hypoxia.  EXAM: PORTABLE CHEST - 1 VIEW  COMPARISON:  Prior chest x-ray 12/02/2013  FINDINGS: Increased bilateral interstitial and airspace opacities throughout both lungs most consistent with pulmonary edema. Borderline cardiomegaly. Chronic blunting of the right costophrenic angle new similar compared to prior. No acute osseous abnormality. No pneumothorax.  IMPRESSION: 1. Borderline cardiomegaly and pulmonary edema. Differential considerations include congestive heart failure and volume  overload given the history of end-stage renal disease on hemodialysis. 2. Stable chronic blunting of the right costophrenic angle. A small pleural effusion is difficult to exclude radiographically.   Electronically Signed   By: Jacqulynn Cadet M.D.   On: 09/26/2014 12:00   Medications: I have reviewed the patient's current medications. Scheduled Meds: . calcitRIOL  0.5 mcg Oral Daily  . carvedilol  50 mg Oral BID WC  . [START ON 09/28/2014] darbepoetin (ARANESP) injection - DIALYSIS  100 mcg Intravenous Q Thu-HD  . Darunavir Ethanolate  800 mg Oral Q breakfast  . doxazosin  8 mg Oral QHS  . doxercalciferol  3 mcg Intravenous Q T,Th,Sa-HD  . febuxostat  80 mg Oral Daily  . [START ON 09/28/2014] ferric gluconate (FERRLECIT/NULECIT) IV  125 mg Intravenous Q Thu-HD  . heparin  5,000 Units Subcutaneous 3 times per day  . heparin  6,000 Units Dialysis Once in dialysis  . lamiVUDine  50 mg Oral Daily  . montelukast  10 mg Oral QHS  . multivitamin  1 tablet Oral QHS  . predniSONE  5 mg Oral Q breakfast  . raltegravir  400 mg Oral BID  . ritonavir  100 mg Oral Q supper  . sodium chloride  3 mL Intravenous Q12H  . zidovudine  300 mg Oral Daily   Continuous Infusions:  PRN Meds:.sodium chloride, sodium chloride, acetaminophen **OR** acetaminophen, feeding supplement (NEPRO CARB STEADY), heparin, lidocaine (PF), lidocaine-prilocaine, pentafluoroprop-tetrafluoroeth   Assessment/Plan: Principal Problem:   Acute respiratory failure with hypercapnia Active Problems:   Human immunodeficiency virus (HIV) disease   Essential hypertension   Chronic pain disorder   End stage renal disease   Anemia in chronic kidney disease   Acute pulmonary edema   Severe hypertension  Darrell Johnson is a 49 yo AAM with PMH of ESRD started on HD last month, HIV, HTN, and chronic pain who presented with acute respiratory failure with hypercapnia likely secondary to opioid induced respiratory depression.   Acute  respiratory failure with hypercapnia: Improving. Patient presented with acute respiratory failure and found to have an acute respiratory acidosis. Pt admits to taking robitussin and 3 vicodin on Monday night, prior to presentation to the ED on Tuesday morning. His respiratory failure with hypercapnia, therefore, is likely secondary opioid induced respiratory depression. He is ESRD on dialysis and has evidence of pulmonary edema on CXR. However this should not cause his significant hypercapnia.He has no history of COPD and no wheezing on exam, no history of OSA, he is obese making OHS a possibility however it does not appear he has been hypercapnic in the past from previous bicarb readings on BMPs. Does not use home O2.  - Continue to  wean O2 - s/p HD session yesterday with 2L removed - scheduled for second HD session today - Narcan considered; held due to significant patient improvement following dialysis (which likely cleaned opioid metabolites from blood)  - Consider Utox ( h/o  + cocaine, + non-prescribed opioids)    Human immunodeficiency virus (HIV) disease: Home meds include prezista 800 mg, ritonavir 100 mg, raltegravir 400 mg, zidovudine 300 mg.  - vL undect, will continue his outpatient regimen - check viral load and CD4 count  Essential hypertension: Hypertensive on arrival to ED, started on NTG drip. Home meds include carvedilol 25 mg, doxazosin 8 mg. Pt was previously on lasix 120 mg  BID but states his nephrologist took him off the medication one week ago as he started dialysis.  Pressures here now 160-170s/90s.  - Attempt to wean off NTG drip - Resume home medications - Echo scheduled today  - Consider discussion of lasix with nephrology; patient would like to continue   Chronic pain disorder: Per notes, pt was last prescribed pain medications in January. States he ran out of these medications but has not been back to see the physician as they "give him a hard time." He admit to  taking wife's vicodin recently for dental pain and overall body pain. H/o violating pain contract. Has h/o UDS + opioids (with metabolites from narcotics that were not prescribed to him) and + cocaine.  -Hold pain medications at this time  ESRD: Pt recently started on dialysis last month. Schedule T, R, Sat.  - Nephrology on board for second session of dialysis today - Renal function panel - Continue out patient phos binders  Hx Gout: -Continue prednisone 5mg  - Continue Urloric  Diet: renal DVT: [place] Hep Code: Full Dispo: Disposition is deferred at this time, awaiting improvement of current medical problems. Anticipated discharge in approximately 1- 2 day(s).   This is a Careers information officer Note.  The care of the patient was discussed with Dr. Heber [place] and the assessment and plan formulated with their assistance.  Please see their attached note for official documentation of the daily encounter.   LOS: 1 day   Fritzi Mandes, Med Student 09/27/2014, 10:05 AM

## 2014-09-27 NOTE — Plan of Care (Signed)
Problem: Consults Goal: Skin Care Protocol Initiated - if Braden Score 18 or less If consults are not indicated, leave blank or document N/A Outcome: Completed/Met Date Met:  09/27/14 Braden 21  Problem: Phase I Progression Outcomes Goal: Dyspnea controlled at rest Outcome: Progressing Pt still has labored breathing while at rest but reports he does not feel dyspneic

## 2014-09-27 NOTE — Progress Notes (Signed)
  Date: 09/27/2014  Patient name: Darrell Johnson  Medical record number: [id number]  Date of birth: September 14, 1965   This patient's plan of care was discussed with the house staff. Please see their note for complete details. I concur with their findings.   [name] and feels much better than yesterday.  Exam:  Comfortably sitting in bed next to his wife. HNT normocephalic atraumatic shock and movement intact  Cardiovascular exam regular rate and rhythm lungs withwheezes throughout the lungs GI protuberant abdomen extremities 2+ edema neurologic exam nonfocal  Assessment and plan  # acute hypoxemic and hypercapnic respiratory failure: Likely a combination of suppression of his CO2 exchange by taking his wife's prescription narcotic plus needing more volume removed with hemodialysis.  Clearly needs more hemodialysis today which is forthcoming.  Previously been on Lasix in addition to the hemodialysis but my understanding now is the plan for just hemodialysis alone with more aggressive fluid removal.  We also check an echocardiogram see if he has had a new development of heart failure.  #2 HIV: Patient states that he is able to obtain all of his medications and they're being paid for by Medicare prescription drug plan with Medicaid. There was some issue with his pharmacy obtaining the Epivir but now he should build to have all of his medicines.  I do not see a genotype in our system by Sammy has some significant mutations to nucleoside reverse transportation inhibitors given the fact that he previously was on Kaletra Combivir and Viread.  He was then changed to his current regimen of Prezista boosted with NORVIR daily AZT and Epivir along with ISENTRESS twice daily  Again I proposed the idea of changing her from ISENTRESS twice daily to daily TIVICAY for dosing simple location there is a study of liver pole showing that dolutegravir levels are not significantly effected by hemodialysis  and in patients who have no evidence of prior integrase resistance daily dolutegravir is fine in a patient with chronic kidney disease  #3  ESRD: trying to get transplant at [facility], MD 09/27/2014, 11:25 AM

## 2014-09-27 NOTE — Progress Notes (Signed)
Attempted to call report. Nurse unable to take report.

## 2014-09-28 ENCOUNTER — Telehealth: Payer: Self-pay | Admitting: Internal Medicine

## 2014-09-28 ENCOUNTER — Inpatient Hospital Stay (HOSPITAL_COMMUNITY): Payer: Medicare Other

## 2014-09-28 DIAGNOSIS — D72819 Decreased white blood cell count, unspecified: Secondary | ICD-10-CM

## 2014-09-28 DIAGNOSIS — J9692 Respiratory failure, unspecified with hypercapnia: Secondary | ICD-10-CM

## 2014-09-28 DIAGNOSIS — R509 Fever, unspecified: Secondary | ICD-10-CM

## 2014-09-28 DIAGNOSIS — E8779 Other fluid overload: Secondary | ICD-10-CM

## 2014-09-28 DIAGNOSIS — R6510 Systemic inflammatory response syndrome (SIRS) of non-infectious origin without acute organ dysfunction: Secondary | ICD-10-CM | POA: Diagnosis not present

## 2014-09-28 DIAGNOSIS — J811 Chronic pulmonary edema: Secondary | ICD-10-CM | POA: Diagnosis present

## 2014-09-28 LAB — CBC
HEMATOCRIT: 25.5 % — AB (ref 39.0–52.0)
Hemoglobin: 8.4 g/dL — ABNORMAL LOW (ref 13.0–17.0)
MCH: 37 pg — AB (ref 26.0–34.0)
MCHC: 32.9 g/dL (ref 30.0–36.0)
MCV: 112.3 fL — ABNORMAL HIGH (ref 78.0–100.0)
PLATELETS: 100 10*3/uL — AB (ref 150–400)
RBC: 2.27 MIL/uL — ABNORMAL LOW (ref 4.22–5.81)
RDW: 16.2 % — AB (ref 11.5–15.5)
WBC: 2.9 10*3/uL — ABNORMAL LOW (ref 4.0–10.5)

## 2014-09-28 LAB — BASIC METABOLIC PANEL
Anion gap: 10 (ref 5–15)
BUN: 71 mg/dL — AB (ref 6–23)
CALCIUM: 8.2 mg/dL — AB (ref 8.4–10.5)
CO2: 25 mmol/L (ref 19–32)
CREATININE: 7.61 mg/dL — AB (ref 0.50–1.35)
Chloride: 102 mmol/L (ref 96–112)
GFR, EST AFRICAN AMERICAN: 9 mL/min — AB (ref >90)
GFR, EST NON AFRICAN AMERICAN: 7 mL/min — AB (ref >90)
Glucose, Bld: 138 mg/dL — ABNORMAL HIGH (ref 70–99)
POTASSIUM: 4.9 mmol/L (ref 3.5–5.1)
Sodium: 137 mmol/L (ref 135–145)

## 2014-09-28 LAB — CD4/CD8 (T-HELPER/T-SUPPRESSOR CELL)
CD4 absolute: 70 /uL — ABNORMAL LOW (ref 500–1900)
CD4%: 28 % — ABNORMAL LOW (ref 30.0–60.0)
CD8 T CELL ABS: 110 /uL — AB (ref 230–1000)
CD8tox: 43 % — ABNORMAL HIGH (ref 15.0–40.0)
Ratio: 0.65 — ABNORMAL LOW (ref 1.0–3.0)
Total lymphocyte count: 260 /uL — ABNORMAL LOW (ref 1000–4000)

## 2014-09-28 LAB — RENAL FUNCTION PANEL
ALBUMIN: 2.8 g/dL — AB (ref 3.5–5.2)
Anion gap: 11 (ref 5–15)
BUN: 71 mg/dL — AB (ref 6–23)
CALCIUM: 8.4 mg/dL (ref 8.4–10.5)
CO2: 23 mmol/L (ref 19–32)
Chloride: 104 mmol/L (ref 96–112)
Creatinine, Ser: 7.78 mg/dL — ABNORMAL HIGH (ref 0.50–1.35)
GFR calc Af Amer: 8 mL/min — ABNORMAL LOW (ref >90)
GFR calc non Af Amer: 7 mL/min — ABNORMAL LOW (ref >90)
Glucose, Bld: 95 mg/dL (ref 70–99)
POTASSIUM: 5.5 mmol/L — AB (ref 3.5–5.1)
Phosphorus: 6.1 mg/dL — ABNORMAL HIGH (ref 2.3–4.6)
Sodium: 138 mmol/L (ref 135–145)

## 2014-09-28 LAB — CBC WITH DIFFERENTIAL/PLATELET
Basophils Absolute: 0 10*3/uL (ref 0.0–0.1)
Basophils Relative: 0 % (ref 0–1)
Eosinophils Absolute: 0 10*3/uL (ref 0.0–0.7)
Eosinophils Relative: 0 % (ref 0–5)
HCT: 25.1 % — ABNORMAL LOW (ref 39.0–52.0)
Hemoglobin: 8.1 g/dL — ABNORMAL LOW (ref 13.0–17.0)
LYMPHS ABS: 0.3 10*3/uL — AB (ref 0.7–4.0)
Lymphocytes Relative: 11 % — ABNORMAL LOW (ref 12–46)
MCH: 35.8 pg — AB (ref 26.0–34.0)
MCHC: 32.3 g/dL (ref 30.0–36.0)
MCV: 111.1 fL — ABNORMAL HIGH (ref 78.0–100.0)
MONO ABS: 0.2 10*3/uL (ref 0.1–1.0)
Monocytes Relative: 9 % (ref 3–12)
NEUTROS ABS: 2.2 10*3/uL (ref 1.7–7.7)
Neutrophils Relative %: 80 % — ABNORMAL HIGH (ref 43–77)
Platelets: 94 10*3/uL — ABNORMAL LOW (ref 150–400)
RBC: 2.26 MIL/uL — ABNORMAL LOW (ref 4.22–5.81)
RDW: 16.2 % — ABNORMAL HIGH (ref 11.5–15.5)
WBC: 2.7 10*3/uL — ABNORMAL LOW (ref 4.0–10.5)

## 2014-09-28 LAB — RAPID URINE DRUG SCREEN, HOSP PERFORMED
Amphetamines: NOT DETECTED
BARBITURATES: NOT DETECTED
Benzodiazepines: NOT DETECTED
Cocaine: NOT DETECTED
OPIATES: NOT DETECTED
Tetrahydrocannabinol: NOT DETECTED

## 2014-09-28 LAB — LACTATE DEHYDROGENASE: LDH: 318 U/L — ABNORMAL HIGH (ref 94–250)

## 2014-09-28 LAB — PROCALCITONIN: Procalcitonin: 22.54 ng/mL

## 2014-09-28 MED ORDER — PENTAFLUOROPROP-TETRAFLUOROETH EX AERO
INHALATION_SPRAY | CUTANEOUS | Status: AC
  Filled 2014-09-28: qty 103.5

## 2014-09-28 MED ORDER — PIPERACILLIN-TAZOBACTAM IN DEX 2-0.25 GM/50ML IV SOLN
2.2500 g | Freq: Three times a day (TID) | INTRAVENOUS | Status: DC
  Administered 2014-09-28: 2.25 g via INTRAVENOUS
  Filled 2014-09-28 (×2): qty 50

## 2014-09-28 MED ORDER — VANCOMYCIN HCL IN DEXTROSE 1-5 GM/200ML-% IV SOLN
1000.0000 mg | INTRAVENOUS | Status: DC
  Filled 2014-09-28: qty 200

## 2014-09-28 MED ORDER — DOXERCALCIFEROL 4 MCG/2ML IV SOLN
INTRAVENOUS | Status: AC
  Administered 2014-09-28: 3 ug via INTRAVENOUS
  Filled 2014-09-28: qty 2

## 2014-09-28 MED ORDER — DARBEPOETIN ALFA 100 MCG/0.5ML IJ SOSY
PREFILLED_SYRINGE | INTRAMUSCULAR | Status: AC
  Administered 2014-09-28: 100 ug via INTRAVENOUS
  Filled 2014-09-28: qty 0.5

## 2014-09-28 MED ORDER — VANCOMYCIN HCL 10 G IV SOLR
2000.0000 mg | Freq: Once | INTRAVENOUS | Status: DC
  Filled 2014-09-28: qty 2000

## 2014-09-28 MED ORDER — BENZONATATE 100 MG PO CAPS
100.0000 mg | ORAL_CAPSULE | Freq: Three times a day (TID) | ORAL | Status: DC | PRN
  Filled 2014-09-28: qty 1

## 2014-09-28 NOTE — Progress Notes (Signed)
Pt's temp 102.2.  Tylenol 650mg  po given.  {aged on call MD x2 .  No return call at this time.  On coming nurse made aware.  Karie Kirks, Therapist, sports.

## 2014-09-28 NOTE — Progress Notes (Signed)
  Date: 09/28/2014  Patient name: Darrell Johnson  Medical record number: [id number]  Date of birth: 1966-03-02   This patient's plan of care was discussed with the house staff. Please see their note for complete details. I concur with their findings.  Exam:   Pt AOX 3 CV: RRR no mgr Pulm: rhonchi throughout anteriorly GI: soft nd, nt +bs Ext 2+ edema  A/P  #1 Fever, leukopenia:  Concern with this is for worsening infection. NOt a clear source to me. His lungs are possibility but they look better on CXR.   If he fevers more tonight or more evidence of systemic disease not improving will CT chest, and likely  abdomen and pelvis to evaluate his pleural effusion and for occult infection in abdomen/pelvis  Blood cultures are cooking  #2 Hypercapnic respiratory failure with volume overload: REsponding to HD, witholding narcotics  I would like TTE as well  #3 HIV: only tweak I would make would be exchange RAL for DTG daily. Could also consider GENOSURE ARCHIVE in clinic to check for R mutations. I will also see if clinic staff can pull paper records (not urgent though)   Truman Hayward, MD 09/28/2014, 5:08 PM

## 2014-09-28 NOTE — Progress Notes (Signed)
Pt temperature 100.7 tonight. MD with IMTS paged and ordered to continue to monitor. Temperature no 102.9 oral. VSS. IMTS paged. Dr. Posey Pronto notified. Pt with no complaints of pain, states he does have a cough occasionally with clear sputum with some blood in it. Pt educated on plan of care update and questions answered. Will continue to monitor. Ronnette Hila, RN

## 2014-09-28 NOTE — Progress Notes (Signed)
Subjective: Pt with fevers to Tmax 102.9 overnight. Asymptomatic, no complains of fever or chills. No increased SOB, abd pain, or urinary sx. Does complain of cough, which is chronic, no increased sputum production. Overnight team started vanc/zosyn but vanc held as patient lost IV access. CXR was normal, improved from admission. Urine and blood cultures pending.    Objective: Vital signs in last 24 hours: Filed Vitals:   09/28/14 1001 09/28/14 1031 09/28/14 1100 09/28/14 1111  BP: 156/78 166/91 169/83 144/74  Pulse: 76 78 79 80  Temp:  98.2 F (36.8 C)    TempSrc:  Oral    Resp: 17 16 19    Height:      Weight:      SpO2: 100% 100% 100%    Weight change: -1.118 kg (-2 lb 7.4 oz)  Intake/Output Summary (Last 24 hours) at 09/28/14 1135 Last data filed at 09/28/14 0909  Gross per 24 hour  Intake   1090 ml  Output   1125 ml  Net    -35 ml   BP 144/74 mmHg  Pulse 80  Temp(Src) 98.2 F (36.8 C) (Oral)  Resp 19  Ht 5\' 10"  (1.778 m)  Wt 105.7 kg (233 lb 0.4 oz)  BMI 33.44 kg/m2  SpO2 100%  General appearance: alert, cooperative and no distress, on 4 L [place] Throat: lips, mucosa, and tongue normal; teeth and gums normal, teeth missing from upper L side (pulled last week,) well healed Lungs: rhonchorous throughout, stable since admission Heart: regular rate and rhythm, S1, S2 normal, no murmur, click, rub or gallop Abdomen: soft, non-tender; bowel sounds normal; no masses,  no organomegaly Extremities: extremities normal, atraumatic, no cyanosis or edema Lab Results: Basic Metabolic Panel:  Recent Labs  09/26/14 1110  09/28/14 0530 09/28/14 0900  NA 143  < > 138 137  K 6.0*  < > 5.5* 4.9  CL 105  < > 104 102  CO2 15*  --  23 25  GLUCOSE 135*  < > 95 138*  BUN 68*  < > 71* 71*  CREATININE 7.97*  < > 7.78* 7.61*  CALCIUM 9.0  --  8.4 8.2*  PHOS 5.9*  --  6.1*  --   < > = values in this interval not displayed. Liver Function Tests:  Recent Labs  09/26/14 1110  09/28/14 0530  ALBUMIN 3.8 2.8*   No results for input(s): LIPASE, AMYLASE in the last 72 hours. No results for input(s): AMMONIA in the last 72 hours. CBC:  Recent Labs  09/26/14 1110  09/28/14 0205 09/28/14 0530  WBC 12.1*  --  2.9* 2.7*  NEUTROABS 9.3*  --   --  2.2  HGB 10.8*  < > 8.4* 8.1*  HCT 33.1*  < > 25.5* 25.1*  MCV 115.7*  --  112.3* 111.1*  PLT 163  --  100* 94*  < > = values in this interval not displayed. Cardiac Enzymes: No results for input(s): CKTOTAL, CKMB, CKMBINDEX, TROPONINI in the last 72 hours. BNP: No results for input(s): PROBNP in the last 72 hours. D-Dimer: No results for input(s): DDIMER in the last 72 hours. CBG:  Recent Labs  09/26/14 1149  GLUCAP 144*   Hemoglobin A1C: No results for input(s): HGBA1C in the last 72 hours. Fasting Lipid Panel: No results for input(s): CHOL, HDL, LDLCALC, TRIG, CHOLHDL, LDLDIRECT in the last 72 hours. Thyroid Function Tests: No results for input(s): TSH, T4TOTAL, FREET4, T3FREE, THYROIDAB in the last 72 hours. Anemia Panel: No  results for input(s): VITAMINB12, FOLATE, FERRITIN, TIBC, IRON, RETICCTPCT in the last 72 hours. Coagulation: No results for input(s): LABPROT, INR in the last 72 hours. Urine Drug Screen: Drugs of Abuse     Component Value Date/Time   LABOPIA NONE DETECTED 09/28/2014 0155   LABOPIA PPS 11/12/2012 1155   COCAINSCRNUR NONE DETECTED 09/28/2014 0155   COCAINSCRNUR NEG 11/12/2012 1155   LABBENZ NONE DETECTED 09/28/2014 0155   LABBENZ NEG 11/12/2012 1155   AMPHETMU NONE DETECTED 09/28/2014 0155   AMPHETMU NEG 11/12/2012 1155   THCU NONE DETECTED 09/28/2014 0155   THCU NEG 11/12/2012 1155   LABBARB NONE DETECTED 09/28/2014 0155   LABBARB NEG 11/12/2012 1155     Micro Results: Recent Results (from the past 240 hour(s))  MRSA PCR Screening     Status: None   Collection Time: 09/26/14  9:30 PM  Result Value Ref Range Status   MRSA by PCR NEGATIVE NEGATIVE Final    Comment:         The GeneXpert MRSA Assay (FDA approved for NASAL specimens only), is one component of a comprehensive MRSA colonization surveillance program. It is not intended to diagnose MRSA infection nor to guide or monitor treatment for MRSA infections.    Studies/Results: Dg Chest 2 View  09/28/2014   CLINICAL DATA:  Fever tonight.  EXAM: CHEST  2 VIEW  COMPARISON:  09/27/2014  FINDINGS: Cardiac enlargement with mild perihilar infiltration probably indicating edema. Small right pleural effusion with basilar atelectasis. Old right rib fractures. Degenerative changes in the spine. No pneumothorax.  IMPRESSION: Cardiac enlargement with mild perihilar edema. Small right pleural effusion.   Electronically Signed   By: Lucienne Capers M.D.   On: 09/28/2014 02:34   Dg Chest 2 View  09/27/2014   CLINICAL DATA:  Subsequent encounter for pulmonary edema  EXAM: CHEST  2 VIEW  COMPARISON:  09/26/2014.  FINDINGS: Two view exam shows vascular congestion with probable interstitial pulmonary edema. Imaging features have improved in the interval. Small right pleural effusion persist. The cardio pericardial silhouette is enlarged. Telemetry leads overlie the chest.  IMPRESSION: Interval improvement in pulmonary edema pattern.  Persistent small right pleural effusion.   Electronically Signed   By: Misty Stanley M.D.   On: 09/27/2014 17:22   Dg Chest Port 1 View  09/26/2014   CLINICAL DATA:  49 year old male cyst with shortness of breath and hypoxia.  EXAM: PORTABLE CHEST - 1 VIEW  COMPARISON:  Prior chest x-ray 12/02/2013  FINDINGS: Increased bilateral interstitial and airspace opacities throughout both lungs most consistent with pulmonary edema. Borderline cardiomegaly. Chronic blunting of the right costophrenic angle new similar compared to prior. No acute osseous abnormality. No pneumothorax.  IMPRESSION: 1. Borderline cardiomegaly and pulmonary edema. Differential considerations include congestive heart failure and  volume overload given the history of end-stage renal disease on hemodialysis. 2. Stable chronic blunting of the right costophrenic angle. A small pleural effusion is difficult to exclude radiographically.   Electronically Signed   By: Jacqulynn Cadet M.D.   On: 09/26/2014 12:00   Medications: I have reviewed the patient's current medications. Scheduled Meds: . calcitRIOL  0.5 mcg Oral Daily  . carvedilol  25 mg Oral BID WC  . Darbepoetin Alfa      . darbepoetin (ARANESP) injection - DIALYSIS  100 mcg Intravenous Q Thu-HD  . Darunavir Ethanolate  800 mg Oral Q breakfast  . doxazosin  8 mg Oral QHS  . doxercalciferol      . doxercalciferol  3 mcg Intravenous Q T,Th,Sa-HD  . febuxostat  80 mg Oral Daily  . ferric gluconate (FERRLECIT/NULECIT) IV  125 mg Intravenous Q Thu-HD  . heparin  6,000 Units Dialysis Once in dialysis  . lamiVUDine  50 mg Oral Daily  . montelukast  10 mg Oral QHS  . multivitamin  1 tablet Oral QHS  . pentafluoroprop-tetrafluoroeth      . predniSONE  5 mg Oral Q breakfast  . raltegravir  400 mg Oral BID  . ritonavir  100 mg Oral Q supper  . sodium chloride  3 mL Intravenous Q12H  . zidovudine  300 mg Oral Daily   Continuous Infusions:  PRN Meds:.sodium chloride, sodium chloride, acetaminophen **OR** acetaminophen, benzonatate, feeding supplement (NEPRO CARB STEADY), heparin, heparin, lidocaine (PF), lidocaine-prilocaine, pentafluoroprop-tetrafluoroeth Assessment/Plan: Principal Problem:   Acute respiratory failure with hypercapnia Active Problems:   Human immunodeficiency virus (HIV) disease   Essential hypertension   Chronic pain disorder   End stage renal disease   Anemia in chronic kidney disease   Acute pulmonary edema   Severe hypertension   Respiratory distress   Shortness of breath  Acute respiratory failure with hypercapnia: Improving. Patient presented with acute respiratory failure and found to have an acute respiratory acidosis. Pt admits to  taking robitussin and 3 vicodin on Monday night, prior to presentation to the ED on Tuesday morning. His respiratory failure with hypercapnia, therefore, is likely secondary opioid induced respiratory depression. He is ESRD on dialysis and has evidence of pulmonary edema on CXR. However this should not cause his significant hypercapnia.He has no history of COPD and no wheezing on exam, no history of OSA, he is obese making OHS a possibility however it does not appear he has been hypercapnic in the past from previous bicarb readings on BMPs. Does not use home O2. Utox negative.  - Continue to wean O2 (currenlyt on 4L) - s/p 3 sessions HD    Fever of Unknown Origin: Pt with Tmax 102.9 at 0230. Pt is asymptomatic, no pain. Did present with "cold" prior to admission and complains of chronic cough. CXR negative.   - f/u BCx - f/u UCx - RVP pending - d/c'd vanc and zoysn (Pt received 1 dose of zosyn, no vanc) - If fevers persist, consider CT chest, abd, pelvis to search for infection site.   Human immunodeficiency virus (HIV) disease: Home meds include prezista 800 mg, ritonavir 100 mg, raltegravir 400 mg, zidovudine 300 mg.  - vL undect, will continue his outpatient regimen   Essential hypertension: Hypertensive on arrival to ED, started on NTG drip. Home meds include carvedilol 25 mg, doxazosin 8 mg. Pt was previously on lasix 120 mg BID but states his nephrologist took him off the medication one week ago as he started dialysis. Pressures here now 160-170s/90s. Weaned from nitro drip.  - Resume home medications  Chronic pain disorder: Per notes, pt was last prescribed pain medications in January. States he ran out of these medications but has not been back to see the physician as they "give him a hard time." He admit to taking wife's vicodin recently for dental pain and overall body pain. H/o violating pain contract. Has h/o UDS + opioids (with metabolites from narcotics that were not  prescribed to him) and + cocaine.  -Hold pain medications at this time  ESRD: Pt recently started on dialysis last month. Schedule T, R, Sat.  - Nephrology on board for second session of dialysis today - Renal function panel - Continue  out patient phos binders  Hx Gout: -Continue prednisone 5mg  - Continue Urloric  Diet: renal DVT: [place] Hep Code: Full Dispo: Disposition is deferred at this time, awaiting improvement of current medical problems. Anticipated discharge in approximately 1- 2 day(s).   This is a Careers information officer Note.  The care of the patient was discussed with Dr. Heber [place] and the assessment and plan formulated with their assistance.  Please see their attached note for official documentation of the daily encounter.   LOS: 2 days   Fritzi Mandes, Med Student 09/28/2014, 11:35 AM

## 2014-09-28 NOTE — Telephone Encounter (Addendum)
Call to patient to confirm appointment for 10/02/14 at 2:15 mailbox full

## 2014-09-28 NOTE — Progress Notes (Signed)
  Echocardiogram 2D Echocardiogram has been performed.  Izekiel Flegel FRANCES 09/28/2014, 2:54 PM

## 2014-09-28 NOTE — Care Management Note (Unsigned)
    Page 1 of 1   09/29/2014     5:04:04 PM CARE MANAGEMENT NOTE 09/29/2014  Patient:  Darrell Johnson, Darrell Johnson   Account Number:  [id number]  Date Initiated:  09/28/2014  Documentation initiated by:  Lavena Loretto  Subjective/Objective Assessment:   Pt adm on 09/26/14 with acute respiratory failure.  PTA, pt resides at home with spouse.     Action/Plan:   Will follow for dc needs as pt progresses.   Anticipated DC Date:  09/30/2014   Anticipated DC Plan:  [place]  CM consult      Choice offered to / List presented to:             Status of service:  In process, will continue to follow Medicare Important Message given?  YES (If response is "NO", the following Medicare IM given date fields will be blank) Date Medicare IM given:  09/29/2014 Medicare IM given by:  Keyia Moretto Date Additional Medicare IM given:   Additional Medicare IM given by:    Discharge Disposition:    Per UR Regulation:  Reviewed for med. necessity/level of care/duration of stay  If discussed at [place] of Stay Meetings, dates discussed:    Comments:

## 2014-09-28 NOTE — Progress Notes (Signed)
ANTIBIOTIC CONSULT NOTE - INITIAL  Pharmacy Consult for Vancomycin and zosyn Indication: Sepsis  No Known Allergies  Patient Measurements: Height: 5\' 10"  (177.8 cm) Weight: 229 lb 14.4 oz (104.282 kg) IBW/kg (Calculated) : 73   Vital Signs: Temp: 100.7 F (38.2 C) (03/10 0259) Temp Source: Oral (03/10 0100) BP: 152/76 mmHg (03/10 0100) Pulse Rate: 95 (03/10 0124) Intake/Output from previous day: 03/09 0701 - 03/10 0700 In: 1040 [P.O.:1040] Out: 1125 [Urine:1125] Intake/Output from this shift: Total I/O In: 200 [P.O.:200] Out: 475 [Urine:475]  Labs:  Recent Labs  09/26/14 1110 09/26/14 1116 09/28/14 0205  WBC 12.1*  --  2.9*  HGB 10.8* 12.2* 8.4*  PLT 163  --  100*  CREATININE 7.97* 7.20*  --    Estimated Creatinine Clearance: 15 mL/min (by C-G formula based on Cr of 7.2). No results for input(s): VANCOTROUGH, VANCOPEAK, VANCORANDOM, GENTTROUGH, GENTPEAK, GENTRANDOM, TOBRATROUGH, TOBRAPEAK, TOBRARND, AMIKACINPEAK, AMIKACINTROU, AMIKACIN in the last 72 hours.   Microbiology: Recent Results (from the past 720 hour(s))  MRSA PCR Screening     Status: None   Collection Time: 09/26/14  9:30 PM  Result Value Ref Range Status   MRSA by PCR NEGATIVE NEGATIVE Final    Comment:        The GeneXpert MRSA Assay (FDA approved for NASAL specimens only), is one component of a comprehensive MRSA colonization surveillance program. It is not intended to diagnose MRSA infection nor to guide or monitor treatment for MRSA infections.     Medical History: Past Medical History  Diagnosis Date  . 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 Hypertension   . Seizures     last seizure was >5 years ago, pt has family history of seizures  . Syphilis 1997    history of syphilis 1997  . Chronic kidney disease     stage 3-4 CKD, followed by Dr. Moshe Cipro  . 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 Rib fractures 01/2009  . HIV infection 1980's    on ART therapy since, followed by ID clinic, complicated  by neuropathy  . 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 Pneumonia   . Arthritis   . Anemia   . HYPERTENSION 05/08/2006  . Gout, unspecified 08/13/2009    Qualifier: Diagnosis of  By: Redmond Pulling  MD, Mateo Flow      Medications:  Scheduled:  . calcitRIOL  0.5 mcg Oral Daily  . carvedilol  25 mg Oral BID WC  . darbepoetin (ARANESP) injection - DIALYSIS  100 mcg Intravenous Q Thu-HD  . Darunavir Ethanolate  800 mg Oral Q breakfast  . doxazosin  8 mg Oral QHS  . doxercalciferol  3 mcg Intravenous Q T,Th,Sa-HD  . febuxostat  80 mg Oral Daily  . ferric gluconate (FERRLECIT/NULECIT) IV  125 mg Intravenous Q Thu-HD  . heparin  5,000 Units Subcutaneous 3 times per day  . heparin  6,000 Units Dialysis Once in dialysis  . lamiVUDine  50 mg Oral Daily  . montelukast  10 mg Oral QHS  . multivitamin  1 tablet Oral QHS  . predniSONE  5 mg Oral Q breakfast  . raltegravir  400 mg Oral BID  . ritonavir  100 mg Oral Q supper  . sodium chloride  3 mL Intravenous Q12H  . zidovudine  300 mg  Oral Daily   Assessment: 49 y.o male with ESRD now with fever to 102.9.   HD qTTSat.  Goal of Therapy:  Vancomycin trough level 15-20 mcg/ml  Plan:  Vancomycin 2gm IV now then then 1000 mg qHD (TTS) Zosyn 2.25 gm IV q8h  Thank you for allowing pharmacy to be part of this patients care team. Nicole Cella, RPh Clinical Pharmacist Pager: [phone] 09/28/2014,4:21 AM

## 2014-09-28 NOTE — Progress Notes (Signed)
  [facility] Progress Note   Subjective: sleepy, spiked temp yest  Filed Vitals:   09/28/14 0124 09/28/14 0259 09/28/14 0603 09/28/14 0851  BP:   142/66   Pulse: 95  86 82  Temp:  100.7 F (38.2 C) 99.5 F (37.5 C) 98.1 F (36.7 C)  TempSrc:   Oral Oral  Resp:   20 20  Height:      Weight:   104.509 kg (230 lb 6.4 oz)   SpO2:   100% 100%   Exam: General: no distress Neck: Supple. JVD not elevated. Lungs: bilat basilar coarse scattered rales and wheezing Heart: RRR with S1 S2 Abdomen: Soft, obese. non-tender, non-distended Ext: trace LE edema Neuro: Alert and oriented X 3. Moves all extremities spontaneously. Dialysis Access: R AVF +b.t  Dialysis Orders: TTS [place] 4 hrs 104.5kgs 2k/2.5Ca+ 6000u heparin/3000 mid Aranesp 100 q week venofer 100mg  q week hectorol 3   Assessment: 1. Resp distress - pulm edema better, possible PNA/ pneumonitis/ flu as well 2. Fever - prob resp infection, per primary team 3. ESRD - TTS south 4. Hypertension/volume - BP better , on home meds 5. Anemia - hgb 12.2- cont weekly ESA and Fe 6. Metabolic bone disease - Cont hectorol. Last PTH 940/phos 5.6 7. Nutrition - NPO- will need renal diet. supplements  8. HIV- cont home meds 9. Current smoker   Plan- HD today , UF max as tolerated, will need dry wt lowered    Kelly Splinter MD  pager [phone]    cell [phone]  09/28/2014, 9:27 AM     Recent Labs Lab 09/26/14 1110 09/26/14 1116 09/28/14 0530  NA 143 139 138  K 6.0* 5.5* 5.5*  CL 105 105 104  CO2 15*  --  23  GLUCOSE 135* 168* 95  BUN 68* 69* 71*  CREATININE 7.97* 7.20* 7.78*  CALCIUM 9.0  --  8.4  PHOS 5.9*  --  6.1*    Recent Labs Lab 09/26/14 1110 09/28/14 0530  ALBUMIN 3.8 2.8*    Recent Labs Lab 09/26/14 1110 09/26/14 1116 09/28/14 0205 09/28/14 0530  WBC 12.1*  --  2.9* 2.7*  NEUTROABS 9.3*  --   --  2.2  HGB 10.8* 12.2* 8.4* 8.1*  HCT 33.1* 36.0* 25.5* 25.1*  MCV  115.7*  --  112.3* 111.1*  PLT 163  --  100* 94*   . calcitRIOL  0.5 mcg Oral Daily  . carvedilol  25 mg Oral BID WC  . darbepoetin (ARANESP) injection - DIALYSIS  100 mcg Intravenous Q Thu-HD  . Darunavir Ethanolate  800 mg Oral Q breakfast  . doxazosin  8 mg Oral QHS  . doxercalciferol  3 mcg Intravenous Q T,Th,Sa-HD  . febuxostat  80 mg Oral Daily  . ferric gluconate (FERRLECIT/NULECIT) IV  125 mg Intravenous Q Thu-HD  . heparin  6,000 Units Dialysis Once in dialysis  . lamiVUDine  50 mg Oral Daily  . montelukast  10 mg Oral QHS  . multivitamin  1 tablet Oral QHS  . pentafluoroprop-tetrafluoroeth      . predniSONE  5 mg Oral Q breakfast  . raltegravir  400 mg Oral BID  . ritonavir  100 mg Oral Q supper  . sodium chloride  3 mL Intravenous Q12H  . zidovudine  300 mg Oral Daily     sodium chloride, sodium chloride, acetaminophen **OR** acetaminophen, benzonatate, feeding supplement (NEPRO CARB STEADY), heparin, heparin, lidocaine (PF), lidocaine-prilocaine, pentafluoroprop-tetrafluoroeth

## 2014-09-28 NOTE — Progress Notes (Signed)
Hemodialysis= system clotted with 45 minutes remaining. 2.2 Liter UF,  NP notified ok to discontinue treatment due to patient cramping last 1/2 hour.

## 2014-09-28 NOTE — Progress Notes (Signed)
Pt unable to receive vanc prior to dialysis d/t no IV access. HD RN notified and stated she will give vanc in dialysis.

## 2014-09-28 NOTE — Progress Notes (Signed)
Pt to start on IVPB antibiotics this AM. IV blown when flushed this AM. Unable to find access site and IV team paged.

## 2014-09-29 ENCOUNTER — Inpatient Hospital Stay (HOSPITAL_COMMUNITY): Payer: Medicare Other

## 2014-09-29 ENCOUNTER — Encounter (HOSPITAL_COMMUNITY): Payer: Self-pay | Admitting: Radiology

## 2014-09-29 DIAGNOSIS — D649 Anemia, unspecified: Secondary | ICD-10-CM

## 2014-09-29 DIAGNOSIS — D72819 Decreased white blood cell count, unspecified: Secondary | ICD-10-CM | POA: Diagnosis not present

## 2014-09-29 DIAGNOSIS — J101 Influenza due to other identified influenza virus with other respiratory manifestations: Secondary | ICD-10-CM | POA: Diagnosis present

## 2014-09-29 DIAGNOSIS — B20 Human immunodeficiency virus [HIV] disease: Secondary | ICD-10-CM | POA: Insufficient documentation

## 2014-09-29 DIAGNOSIS — D61818 Other pancytopenia: Secondary | ICD-10-CM

## 2014-09-29 DIAGNOSIS — R509 Fever, unspecified: Secondary | ICD-10-CM

## 2014-09-29 DIAGNOSIS — B59 Pneumocystosis: Secondary | ICD-10-CM | POA: Diagnosis present

## 2014-09-29 LAB — BLOOD GAS, ARTERIAL
Acid-Base Excess: 0.7 mmol/L (ref 0.0–2.0)
Bicarbonate: 25.8 mEq/L — ABNORMAL HIGH (ref 20.0–24.0)
Drawn by: 36989
FIO2: 0.21 %
O2 SAT: 88.5 %
PCO2 ART: 49.5 mmHg — AB (ref 35.0–45.0)
PO2 ART: 60.4 mmHg — AB (ref 80.0–100.0)
Patient temperature: 98.6
TCO2: 27.4 mmol/L (ref 0–100)
pH, Arterial: 7.338 — ABNORMAL LOW (ref 7.350–7.450)

## 2014-09-29 LAB — INFLUENZA PANEL BY PCR (TYPE A & B)
H1N1FLUPCR: DETECTED — AB
INFLBPCR: NEGATIVE
Influenza A By PCR: POSITIVE — AB

## 2014-09-29 LAB — IRON AND TIBC
Iron: 20 ug/dL — ABNORMAL LOW (ref 42–165)
SATURATION RATIOS: 8 % — AB (ref 20–55)
TIBC: 243 ug/dL (ref 215–435)
UIBC: 223 ug/dL (ref 125–400)

## 2014-09-29 LAB — URINE CULTURE: Colony Count: 25000

## 2014-09-29 LAB — FERRITIN: Ferritin: 590 ng/mL — ABNORMAL HIGH (ref 22–322)

## 2014-09-29 LAB — STREP PNEUMONIAE URINARY ANTIGEN: Strep Pneumo Urinary Antigen: NEGATIVE

## 2014-09-29 MED ORDER — SULFAMETHOXAZOLE-TRIMETHOPRIM 400-80 MG/5ML IV SOLN
500.0000 mg | INTRAVENOUS | Status: DC
  Administered 2014-09-29: 500 mg via INTRAVENOUS
  Filled 2014-09-29 (×3): qty 31.3

## 2014-09-29 MED ORDER — PENTAFLUOROPROP-TETRAFLUOROETH EX AERO
1.0000 | INHALATION_SPRAY | CUTANEOUS | Status: DC | PRN
Start: 2014-09-29 — End: 2014-09-29

## 2014-09-29 MED ORDER — NEPRO/CARBSTEADY PO LIQD
237.0000 mL | ORAL | Status: DC | PRN
  Filled 2014-09-29: qty 237

## 2014-09-29 MED ORDER — ALTEPLASE 2 MG IJ SOLR
2.0000 mg | Freq: Once | INTRAMUSCULAR | Status: DC | PRN
  Filled 2014-09-29: qty 2

## 2014-09-29 MED ORDER — HEPARIN SODIUM (PORCINE) 1000 UNIT/ML DIALYSIS: 1000.0000 [IU] | INTRAMUSCULAR | Status: DC | PRN

## 2014-09-29 MED ORDER — SODIUM CHLORIDE 0.9 % IV SOLN: 100.0000 mL | INTRAVENOUS | Status: DC | PRN

## 2014-09-29 MED ORDER — LIDOCAINE HCL (PF) 1 % IJ SOLN: 5.0000 mL | INTRAMUSCULAR | Status: DC | PRN

## 2014-09-29 MED ORDER — PREDNISONE 20 MG PO TABS
40.0000 mg | ORAL_TABLET | Freq: Two times a day (BID) | ORAL | Status: DC
  Administered 2014-09-29 – 2014-09-30 (×3): 40 mg via ORAL
  Filled 2014-09-29 (×6): qty 2

## 2014-09-29 MED ORDER — DOXERCALCIFEROL 4 MCG/2ML IV SOLN
INTRAVENOUS | Status: AC
  Administered 2014-09-29: 3 ug via INTRAVENOUS
  Filled 2014-09-29: qty 2

## 2014-09-29 MED ORDER — HEPARIN SODIUM (PORCINE) 1000 UNIT/ML DIALYSIS: 3500.0000 [IU] | INTRAMUSCULAR | Status: DC | PRN

## 2014-09-29 MED ORDER — PENTAFLUOROPROP-TETRAFLUOROETH EX AERO: 1.0000 "application " | INHALATION_SPRAY | CUTANEOUS | Status: DC | PRN

## 2014-09-29 MED ORDER — NEPRO/CARBSTEADY PO LIQD: 237.0000 mL | ORAL | Status: DC | PRN

## 2014-09-29 MED ORDER — OSELTAMIVIR PHOSPHATE 30 MG PO CAPS
30.0000 mg | ORAL_CAPSULE | Freq: Every day | ORAL | Status: DC
Start: 2014-09-29 — End: 2014-09-30
  Administered 2014-09-29: 30 mg via ORAL
  Filled 2014-09-29 (×2): qty 1

## 2014-09-29 MED ORDER — HEPARIN SODIUM (PORCINE) 1000 UNIT/ML DIALYSIS
1000.0000 [IU] | INTRAMUSCULAR | Status: DC | PRN
Start: 2014-09-29 — End: 2014-09-29

## 2014-09-29 MED ORDER — IOHEXOL 300 MG/ML  SOLN
25.0000 mL | INTRAMUSCULAR | Status: AC
  Administered 2014-09-29: 25 mL via ORAL

## 2014-09-29 MED ORDER — SODIUM CHLORIDE 0.9 % IV SOLN
100.0000 mL | INTRAVENOUS | Status: DC | PRN
Start: 2014-09-29 — End: 2014-09-29

## 2014-09-29 MED ORDER — LIDOCAINE-PRILOCAINE 2.5-2.5 % EX CREA: 1.0000 "application " | TOPICAL_CREAM | CUTANEOUS | Status: DC | PRN

## 2014-09-29 MED ORDER — LIDOCAINE-PRILOCAINE 2.5-2.5 % EX CREA
1.0000 | TOPICAL_CREAM | CUTANEOUS | Status: DC | PRN
Start: 2014-09-29 — End: 2014-09-29

## 2014-09-29 MED ORDER — ALTEPLASE 2 MG IJ SOLR: 2.0000 mg | Freq: Once | INTRAMUSCULAR | Status: DC | PRN

## 2014-09-29 NOTE — Progress Notes (Signed)
Darrell Johnson [id number] DOB: 08/10/1965 DOA: 09/26/2014 PCP: Dellia Nims, MD Assessment/ Plan:    49 year old gentleman with past medical history of ESRD on HD, HIV/AIDS, severe hypertension among other health problems presented with cough and increased fatigue.  Acute respiratory failure:  Improving. Positive for H1 N1. Chest CT scan from 09/26/2014 revealed patchy nodular perihilar infiltrates bilaterally consistent with multifocal pneumonia. There is a question of PCP due to his elevated LDL and pro-calcitonin. Plan -Started on Bactrim IV. -Started on Prednisone -Start Tamiflu 30 mg daily for 5 days. Discussed with pharmacy about dosing in renal patient. -Supplemental oxygen via nasal cannula to keep O2 sats above 92%.   Fevers: Likely related to H1N1 flu. Blood cultures have been negative. Will obtain a CT of abdomen and pelvis and cover for intra-abdominal infection if revealed.   HIV: cont with HIV medications.  ESRD: On HD TTS. No acute electrolyte abnormalities. Check BMP tomorrow.  HTN: Better controlled currently. Continue with Cardura and Coreg.  Anemia/pancytopenie: probably related to H1N1. Stable. Related to ESRD. Will monitor as needed.   F/E/N: renal diet   VTE Ppx: subq heparin  CODE STATUS: Full code. Family Communication: Discussed with patient about plan of care  (indicate person spoken with, relationship, and if by phone, the number)  Disposition: Pending improvement in his respiratory status and fevers.     The patient does have current PCP (Ahmed, Tasrif, MD), therefore is require OPC follow-up after discharge.   The patient does not have transportation limitations that hinder transportation to clinic appointments.  .Services Needed at time of discharge: Y = Yes, Blank = No PT:   OT:   RN:   Equipment:   Other:    Length of Stay: 3 days   Subjective/Interval Events:    Subjective:  Feeling better today.  Spike temp last night of  102.5  Interval Events: H1N1 positive.    Objective:     Last BM Date: 09/26/14   Weights: 24-hour Weight change: 3 lb 2 oz (1.418 kg)  Filed Weights   09/28/14 0851 09/28/14 1212 09/29/14 0503  Weight: 233 lb 0.4 oz (105.7 kg) 225 lb 8.5 oz (102.3 kg) 227 lb (102.967 kg)     Intake/Output:   Intake/Output Summary (Last 24 hours) at 09/29/14 1341 Last data filed at 09/29/14 0830  Gross per 24 hour  Intake    560 ml  Output    200 ml  Net    360 ml       Physical Exam: Vital Signs:   Temp:  [98.1 F (36.7 C)-102.2 F (39 C)] 98.1 F (36.7 C) (03/11 0503) Pulse Rate:  [85-91] 85 (03/11 0503) Resp:  [18] 18 (03/11 0503) BP: (134-146)/(67-79) 145/75 mmHg (03/11 0503) SpO2:  [98 %-100 %] 98 % (03/11 0503) Weight:  [227 lb (102.967 kg)] 227 lb (102.967 kg) (03/11 0503) General: Vital signs reviewed. In mild distress, appears restless.  Lungs: Clear to auscultation bilaterally  Heart: RRR; no extra sounds or murmurs  Abdomen: Bowel sounds present, soft, nontender; no hepatosplenomegaly  Extremities: No bilateral ankle edema  Neurologic: Alert and oriented x3. Moves all extremities  Labs: Basic Metabolic Panel:  Recent Labs Lab 09/26/14 1110 09/26/14 1116 09/28/14 0530 09/28/14 0900  NA 143 139 138 137  K 6.0* 5.5* 5.5* 4.9  CL 105 105 104 102  CO2 15*  --  23 25  GLUCOSE 135* 168* 95 138*  BUN 68* 69* 71* 71*  CREATININE 7.97* 7.20* 7.78*  7.61*  CALCIUM 9.0  --  8.4 8.2*  PHOS 5.9*  --  6.1*  --     Liver Function Tests:  Recent Labs Lab 09/26/14 1110 09/28/14 0530  ALBUMIN 3.8 2.8*     CBC:  Recent Labs Lab 09/26/14 1110 09/26/14 1116 09/28/14 0205 09/28/14 0530  WBC 12.1*  --  2.9* 2.7*  NEUTROABS 9.3*  --   --  2.2  HGB 10.8* 12.2* 8.4* 8.1*  HCT 33.1* 36.0* 25.5* 25.1*  MCV 115.7*  --  112.3* 111.1*  PLT 163  --  100* 94*    Cardiac Enzymes: No results for input(s): CKTOTAL, CKMB, CKMBINDEX, TROPONINI in the last 168  hours.   CBG:  Recent Labs Lab 09/26/14 1149  GLUCAP 144*    Coagulation Studies: No results for input(s): LABPROT, INR in the last 72 hours.  Microbiology: Results for orders placed or performed during the hospital encounter of 09/26/14  MRSA PCR Screening     Status: None   Collection Time: 09/26/14  9:30 PM  Result Value Ref Range Status   MRSA by PCR NEGATIVE NEGATIVE Final    Comment:        The GeneXpert MRSA Assay (FDA approved for NASAL specimens only), is one component of a comprehensive MRSA colonization surveillance program. It is not intended to diagnose MRSA infection nor to guide or monitor treatment for MRSA infections.   Culture, blood (routine x 2)     Status: None (Preliminary result)   Collection Time: 09/28/14  1:55 AM  Result Value Ref Range Status   Specimen Description BLOOD LEFT ARM  Final   Special Requests   Final    BOTTLES DRAWN AEROBIC AND ANAEROBIC 10CC AERO 5CC ANA   Culture   Final           BLOOD CULTURE RECEIVED NO GROWTH TO DATE CULTURE WILL BE HELD FOR 5 DAYS BEFORE ISSUING A FINAL NEGATIVE REPORT Performed at [facility]    Report Status PENDING  Incomplete  Culture, Urine     Status: None   Collection Time: 09/28/14  1:55 AM  Result Value Ref Range Status   Specimen Description URINE, CLEAN CATCH  Final   Special Requests NONE  Final   Colony Count   Final    25,000 COLONIES/ML Performed at [facility]    Culture   Final    Multiple bacterial morphotypes present, none predominant. Suggest appropriate recollection if clinically indicated. Performed at [facility]    Report Status 09/29/2014 FINAL  Final  Culture, blood (routine x 2)     Status: None (Preliminary result)   Collection Time: 09/28/14  2:05 AM  Result Value Ref Range Status   Specimen Description BLOOD LEFT HAND  Final   Special Requests BOTTLES DRAWN AEROBIC ONLY 10CC  Final   Culture   Final           BLOOD CULTURE RECEIVED NO  GROWTH TO DATE CULTURE WILL BE HELD FOR 5 DAYS BEFORE ISSUING A FINAL NEGATIVE REPORT Note: Culture results may be compromised due to an excessive volume of blood received in culture bottles. Performed at [facility]    Report Status PENDING  Incomplete     Imaging: Dg Chest 2 View  09/28/2014   CLINICAL DATA:  Fever tonight.  EXAM: CHEST  2 VIEW  COMPARISON:  09/27/2014  FINDINGS: Cardiac enlargement with mild perihilar infiltration probably indicating edema. Small right pleural effusion with basilar atelectasis.  Old right rib fractures. Degenerative changes in the spine. No pneumothorax.  IMPRESSION: Cardiac enlargement with mild perihilar edema. Small right pleural effusion.   Electronically Signed   By: Lucienne Capers M.D.   On: 09/28/2014 02:34   Dg Chest 2 View  09/27/2014   CLINICAL DATA:  Subsequent encounter for pulmonary edema  EXAM: CHEST  2 VIEW  COMPARISON:  09/26/2014.  FINDINGS: Two view exam shows vascular congestion with probable interstitial pulmonary edema. Imaging features have improved in the interval. Small right pleural effusion persist. The cardio pericardial silhouette is enlarged. Telemetry leads overlie the chest.  IMPRESSION: Interval improvement in pulmonary edema pattern.  Persistent small right pleural effusion.   Electronically Signed   By: Misty Stanley M.D.   On: 09/27/2014 17:22   Ct Chest Wo Contrast  09/28/2014   CLINICAL DATA:  Fever and shortness of breath.  EXAM: CT CHEST WITHOUT CONTRAST  TECHNIQUE: Multidetector CT imaging of the chest was performed following the standard protocol without IV contrast.  COMPARISON:  Chest 09/28/2014  FINDINGS: Mild cardiac enlargement. Normal caliber thoracic aorta. Scattered mediastinal lymph nodes are not pathologically enlarged. Esophagus is decompressed.  There is diffuse nodular patchy perihilar infiltration in both lungs. This could represent multifocal pneumonia or airspace edema. Minimal left pleural  effusion. No pneumothorax. Airways appear patent.  Included portions of the upper abdominal organs are grossly unremarkable. Old healed right rib fractures. Calcifications in the soft tissues adjacent to the right shoulder possibly representing chondrocalcinosis.  IMPRESSION: Patchy nodular perihilar infiltration bilaterally consistent with multifocal pneumonia versus airspace edema.   Electronically Signed   By: Lucienne Capers M.D.   On: 09/28/2014 21:20      Medications:    Infusions:     Scheduled Medications: . carvedilol  25 mg Oral BID WC  . darbepoetin (ARANESP) injection - DIALYSIS  100 mcg Intravenous Q Thu-HD  . Darunavir Ethanolate  800 mg Oral Q breakfast  . doxazosin  8 mg Oral QHS  . doxercalciferol  3 mcg Intravenous Q T,Th,Sa-HD  . febuxostat  80 mg Oral Daily  . ferric gluconate (FERRLECIT/NULECIT) IV  125 mg Intravenous Q Thu-HD  . lamiVUDine  50 mg Oral Daily  . montelukast  10 mg Oral QHS  . multivitamin  1 tablet Oral QHS  . predniSONE  40 mg Oral BID WC  . raltegravir  400 mg Oral BID  . ritonavir  100 mg Oral Q supper  . sodium chloride  3 mL Intravenous Q12H  . sulfamethoxazole-trimethoprim  500 mg Intravenous Q24H  . zidovudine  300 mg Oral Daily     PRN Medications: acetaminophen **OR** acetaminophen, benzonatate, feeding supplement (NEPRO CARB STEADY)  Principal Problem:   Acute respiratory failure with hypercapnia Active Problems:   Human immunodeficiency virus (HIV) disease   Essential hypertension   Chronic pain disorder   End stage renal disease   Anemia in chronic kidney disease   Acute pulmonary edema   Severe hypertension   Respiratory distress   Shortness of breath   SIRS (systemic inflammatory response syndrome)   Pulmonary edema   PCP (pneumocystis carinii pneumonia)   Fever of unknown origin (FUO)   AIDS   Leukopenia    Signed by:  Jessee Avers, MD PGY-3, Internal Medicine  Pager [phone] 09/29/2014, 1:41  PM

## 2014-09-29 NOTE — Progress Notes (Signed)
Subjective: Pt was febrile to 102.2 again at @1615 . Treated with Tylenol. CT scan chest showed b/l patchy infiltrates concerning for multifocal PNA vs edema. Urine strep Ag was negative. LDH sent, was high at 318.    Objective: Vital signs in last 24 hours: Filed Vitals:   09/28/14 2003 09/28/14 2144 09/29/14 0010 09/29/14 0503  BP: 140/67 136/67  145/75  Pulse: 88   85  Temp: 100.4 F (38 C)  99.1 F (37.3 C) 98.1 F (36.7 C)  TempSrc: Oral   Oral  Resp: 18   18  Height:      Weight:    102.967 kg (227 lb)  SpO2: 100%   98%   Weight change: 1.418 kg (3 lb 2 oz)  Intake/Output Summary (Last 24 hours) at 09/29/14 0658 Last data filed at 09/29/14 0538  Gross per 24 hour  Intake    700 ml  Output   2456 ml  Net  -1756 ml   BP 145/75 mmHg  Pulse 85  Temp(Src) 98.1 F (36.7 C) (Oral)  Resp 18  Ht 5\' 10"  (1.778 m)  Wt 102.967 kg (227 lb)  BMI 32.57 kg/m2  SpO2 98% General appearance: tired- appearing AAM, no acute distress Lungs: rhoncorous lung sounds throughout, unchanged from admission, no distress Heart: regular rate and rhythm, S1, S2 normal, no murmur, click, rub or gallop Abdomen: soft, non-tender; bowel sounds normal; no masses,  no organomegaly, obese, protruberant Extremities: extremities normal, atraumatic, no cyanosis or edema   Lab Results: Basic Metabolic Panel:  Recent Labs  09/26/14 1110  09/28/14 0530 09/28/14 0900  NA 143  < > 138 137  K 6.0*  < > 5.5* 4.9  CL 105  < > 104 102  CO2 15*  --  23 25  GLUCOSE 135*  < > 95 138*  BUN 68*  < > 71* 71*  CREATININE 7.97*  < > 7.78* 7.61*  CALCIUM 9.0  --  8.4 8.2*  PHOS 5.9*  --  6.1*  --   < > = values in this interval not displayed. Liver Function Tests:  Recent Labs  09/26/14 1110 09/28/14 0530  ALBUMIN 3.8 2.8*   No results for input(s): LIPASE, AMYLASE in the last 72 hours. No results for input(s): AMMONIA in the last 72 hours. CBC:  Recent Labs  09/26/14 1110  09/28/14 0205  09/28/14 0530  WBC 12.1*  --  2.9* 2.7*  NEUTROABS 9.3*  --   --  2.2  HGB 10.8*  < > 8.4* 8.1*  HCT 33.1*  < > 25.5* 25.1*  MCV 115.7*  --  112.3* 111.1*  PLT 163  --  100* 94*  < > = values in this interval not displayed.   Recent Labs  09/26/14 1149  GLUCAP 144*   Recent Labs  09/28/14 1015  TIBC 243  IRON 20*   Coagulation: No results for input(s): LABPROT, INR in the last 72 hours. Urine Drug Screen: Drugs of Abuse     Component Value Date/Time   LABOPIA NONE DETECTED 09/28/2014 0155   LABOPIA PPS 11/12/2012 1155   COCAINSCRNUR NONE DETECTED 09/28/2014 0155   COCAINSCRNUR NEG 11/12/2012 1155   LABBENZ NONE DETECTED 09/28/2014 0155   LABBENZ NEG 11/12/2012 1155   AMPHETMU NONE DETECTED 09/28/2014 0155   AMPHETMU NEG 11/12/2012 1155   THCU NONE DETECTED 09/28/2014 0155   THCU NEG 11/12/2012 1155   LABBARB NONE DETECTED 09/28/2014 0155   LABBARB NEG 11/12/2012 1155  Blood Culture    Component Value Date/Time   SDES BLOOD LEFT HAND 09/28/2014 0205   SPECREQUEST BOTTLES DRAWN AEROBIC ONLY 10CC 09/28/2014 0205   CULT  09/28/2014 0205           BLOOD CULTURE RECEIVED NO GROWTH TO DATE CULTURE WILL BE HELD FOR 5 DAYS BEFORE ISSUING A FINAL NEGATIVE REPORT Note: Culture results may be compromised due to an excessive volume of blood received in culture bottles. Performed at [facility] PENDING 09/28/2014 0205      Micro Results: Recent Results (from the past 240 hour(s))  MRSA PCR Screening     Status: None   Collection Time: 09/26/14  9:30 PM  Result Value Ref Range Status   MRSA by PCR NEGATIVE NEGATIVE Final    Comment:        The GeneXpert MRSA Assay (FDA approved for NASAL specimens only), is one component of a comprehensive MRSA colonization surveillance program. It is not intended to diagnose MRSA infection nor to guide or monitor treatment for MRSA infections.    Studies/Results: Dg Chest 2 View  09/28/2014    CLINICAL DATA:  Fever tonight.  EXAM: CHEST  2 VIEW  COMPARISON:  09/27/2014  FINDINGS: Cardiac enlargement with mild perihilar infiltration probably indicating edema. Small right pleural effusion with basilar atelectasis. Old right rib fractures. Degenerative changes in the spine. No pneumothorax.  IMPRESSION: Cardiac enlargement with mild perihilar edema. Small right pleural effusion.   Electronically Signed   By: Lucienne Capers M.D.   On: 09/28/2014 02:34   Dg Chest 2 View  09/27/2014   CLINICAL DATA:  Subsequent encounter for pulmonary edema  EXAM: CHEST  2 VIEW  COMPARISON:  09/26/2014.  FINDINGS: Two view exam shows vascular congestion with probable interstitial pulmonary edema. Imaging features have improved in the interval. Small right pleural effusion persist. The cardio pericardial silhouette is enlarged. Telemetry leads overlie the chest.  IMPRESSION: Interval improvement in pulmonary edema pattern.  Persistent small right pleural effusion.   Electronically Signed   By: Misty Stanley M.D.   On: 09/27/2014 17:22   Ct Chest Wo Contrast  09/28/2014   CLINICAL DATA:  Fever and shortness of breath.  EXAM: CT CHEST WITHOUT CONTRAST  TECHNIQUE: Multidetector CT imaging of the chest was performed following the standard protocol without IV contrast.  COMPARISON:  Chest 09/28/2014  FINDINGS: Mild cardiac enlargement. Normal caliber thoracic aorta. Scattered mediastinal lymph nodes are not pathologically enlarged. Esophagus is decompressed.  There is diffuse nodular patchy perihilar infiltration in both lungs. This could represent multifocal pneumonia or airspace edema. Minimal left pleural effusion. No pneumothorax. Airways appear patent.  Included portions of the upper abdominal organs are grossly unremarkable. Old healed right rib fractures. Calcifications in the soft tissues adjacent to the right shoulder possibly representing chondrocalcinosis.  IMPRESSION: Patchy nodular perihilar infiltration  bilaterally consistent with multifocal pneumonia versus airspace edema.   Electronically Signed   By: Lucienne Capers M.D.   On: 09/28/2014 21:20   Medications: I have reviewed the patient's current medications. Scheduled Meds: . calcitRIOL  0.5 mcg Oral Daily  . carvedilol  25 mg Oral BID WC  . darbepoetin (ARANESP) injection - DIALYSIS  100 mcg Intravenous Q Thu-HD  . Darunavir Ethanolate  800 mg Oral Q breakfast  . doxazosin  8 mg Oral QHS  . doxercalciferol  3 mcg Intravenous Q T,Th,Sa-HD  . febuxostat  80 mg Oral Daily  . ferric gluconate (FERRLECIT/NULECIT) IV  125 mg Intravenous Q Thu-HD  . lamiVUDine  50 mg Oral Daily  . montelukast  10 mg Oral QHS  . multivitamin  1 tablet Oral QHS  . predniSONE  5 mg Oral Q breakfast  . raltegravir  400 mg Oral BID  . ritonavir  100 mg Oral Q supper  . sodium chloride  3 mL Intravenous Q12H  . zidovudine  300 mg Oral Daily   Continuous Infusions:  PRN Meds:.acetaminophen **OR** acetaminophen, benzonatate, feeding supplement (NEPRO CARB STEADY)   Assessment/Plan: Principal Problem:   Acute respiratory failure with hypercapnia Active Problems:   Human immunodeficiency virus (HIV) disease   Essential hypertension   Chronic pain disorder   End stage renal disease   Anemia in chronic kidney disease   Acute pulmonary edema   Severe hypertension   Respiratory distress   Shortness of breath   SIRS (systemic inflammatory response syndrome)   Pulmonary edema   Acute respiratory failure with hypercapnia, improving - 2/2 volume overload in ESRD and likely accidental narcotic overdose. - Serial CXR show improvement although still has mild right pleural effusion -Nephrology to determine new dry weight  - Echo 09/27/13 shows EF 55-60%; mild LVH and LA dilation, no significant dysfunction  - Continue to wean off O2, on 4L now  Fever/ Possible SIRS: - Pt was febrile to 102.2 @1615  yesterday, treated with Tylenol. CT scan chest showed b/l  patchy infiltrates concerning for multifocal PNA vs edema. Pt continues to be asymptomatic.  He does have a new neutropenia that would make the diagnosis of SIRS but not SEPSIS as he DOES NOT have a suspected source. Urine strep Ag was negative. LDH 318. CD4 count 70. In setting of CD4 70, neutropenia, patchy infiltrates and fevers, will initiate treatment for PCP.  - Start Septra 500mg  (5mg /kg) IV q24 hours for possible PCP - give doses after dialysis - Start prednisone 40 mg BID  - Will scan chest abd and pelvis CT to eval for source - Will follow blood cultures, ngtd - PCT elevated at 22; literature review suggests such an elevated PCT is suggestive of bacterial pneumonia (Nyamande et al 2006, Gerard et al, 1997)  - urine legionella antigen pending  - RVP pending  - Influenza PCR pending    Human immunodeficiency virus (HIV) disease - Controlled, considering changes per Dr. Tommy Medal.  Chronic pain disorder - Use narcotics sparingly, none given during this hospitalization so far  End stage renal disease - Dialysis per renal  Anemia in chronic kidney disease - Per renal  Severe hypertension - Mildly elevated now in 140s-160s/80s-90s - continue to monitor  This is a Careers information officer Note.  The care of the patient was discussed with Dr. Tommy Medal and the assessment and plan formulated with their assistance.  Please see their attached note for official documentation of the daily encounter.   LOS: 3 days   Darrell Johnson, Med Student 09/29/2014, 6:58 AM

## 2014-09-29 NOTE — Progress Notes (Signed)
  Date: 09/29/2014  Patient name: Darrell Johnson  Medical record number: [id number]  Date of birth: 07/21/1966   This patient's plan of care was discussed with the house staff. Please see their note for complete details. I concur with their findings.  I was just made aware of the fact that the patient's influenza H1N1 PCR is positive.  Severe influenza could explain his entire picture including his leukopenia and CD4 lymphopenia. I've seen this in NON HIV infected patients as well.   However given how low his CD4 count is in given his hypoxemia , elevated LDH, low CD4 I am concerned that is still a possibility could have PCP and influenza so for now we will go forward with both treatment for influenza and for PCP      Truman Hayward, MD 09/29/2014, 2:16 PM

## 2014-09-29 NOTE — Progress Notes (Signed)
  Date: 09/29/2014  Patient name: Darrell Johnson  Medical record number: [id number]  Date of birth: [birth year]   This patient's plan of care was discussed with the house staff. Please see their note for complete details. I concur with their findings.  Subjective the patient feels a little better today.  Exam  Pt AOX 3 CV: RRR no mgr Pulm: rhonchi throughout anteriorly GI: soft nd, nt +bs Ext 2+ edema  A/P:  #1 Multinodular infiltrates on chest CT with hypoxemia and elevated LDH in patient with HIV and now found to have CD4 count of 70: This has to be treated as PCP pneumonia  TMP/SMX actually DOES ALSO have activity vs Pneumococcus as well as certainly MRSA, MSSA  --would check legionella ag as well though presentation doesn't fit as well to me for Legionella --can check histo ag urine and cryptococcal ag serum  --we are starting IV TMP/SMX and prednisone 40mg  bid  #2 Leukopenia and profound drop in his CD4 counts by the fact that he has been adherent to his antivirals by his account and viral loads through January showed continued virological suppression.  I am worried that he has some other systemic illness including potentially a malignancy that may be driving his leukopenia and that he could have a second process besides what is apparent PCP pneumonia  I would like to get CT of abdomen with IV and oral contrast if this can be coordinated with nephrology team and his HD  #3 elevated PCT: not clear to me what is causing this high of PCT  #4 HIV: continue current meds, again I would surprised if he is still not virologically suppressed given his history and his stated compliance  #5 end-stage on hemodialysis with admission with apparent fluid overload that is responded to hemodialysis continue hemodialysis and as above would coordinate with CT scan.    Truman Hayward, MD 09/29/2014, 11:43 AM

## 2014-09-29 NOTE — Progress Notes (Signed)
ANTIBIOTIC CONSULT NOTE - FOLLOW UP  Pharmacy Consult for septra Indication: pneumocystis pneumonia  No Known Allergies  Patient Measurements: Height: 5\' 10"  (177.8 cm) Weight: 227 lb (102.967 kg) (scale b) IBW/kg (Calculated) : 73   Vital Signs: Temp: 98.1 F (36.7 C) (03/11 0503) Temp Source: Oral (03/11 0503) BP: 145/75 mmHg (03/11 0503) Pulse Rate: 85 (03/11 0503) Intake/Output from previous day: 03/10 0701 - 03/11 0700 In: 700 [P.O.:700] Out: 2456 [Urine:200] Intake/Output from this shift:    Labs:  Recent Labs  09/26/14 1110 09/26/14 1116 09/28/14 0205 09/28/14 0530 09/28/14 0900  WBC 12.1*  --  2.9* 2.7*  --   HGB 10.8* 12.2* 8.4* 8.1*  --   PLT 163  --  100* 94*  --   CREATININE 7.97* 7.20*  --  7.78* 7.61*   Estimated Creatinine Clearance: 14.1 mL/min (by C-G formula based on Cr of 7.61). No results for input(s): VANCOTROUGH, VANCOPEAK, VANCORANDOM, GENTTROUGH, GENTPEAK, GENTRANDOM, TOBRATROUGH, TOBRAPEAK, TOBRARND, AMIKACINPEAK, AMIKACINTROU, AMIKACIN in the last 72 hours.   Microbiology: Recent Results (from the past 720 hour(s))  MRSA PCR Screening     Status: None   Collection Time: 09/26/14  9:30 PM  Result Value Ref Range Status   MRSA by PCR NEGATIVE NEGATIVE Final    Comment:        The GeneXpert MRSA Assay (FDA approved for NASAL specimens only), is one component of a comprehensive MRSA colonization surveillance program. It is not intended to diagnose MRSA infection nor to guide or monitor treatment for MRSA infections.     Anti-infectives    Start     Dose/Rate Route Frequency Ordered Stop   09/29/14 1200  sulfamethoxazole-trimethoprim (BACTRIM) 500 mg in dextrose 5 % 500 mL IVPB     500 mg 354.2 mL/hr over 90 Minutes Intravenous Every 24 hours 09/29/14 0806     09/28/14 1200  vancomycin (VANCOCIN) IVPB 1000 mg/200 mL premix  Status:  Discontinued     1,000 mg 200 mL/hr over 60 Minutes Intravenous Every T-Th-Sa (Hemodialysis)  09/28/14 0430 09/28/14 0752   09/28/14 0530  vancomycin (VANCOCIN) 2,000 mg in sodium chloride 0.9 % 500 mL IVPB  Status:  Discontinued     2,000 mg 250 mL/hr over 120 Minutes Intravenous  Once 09/28/14 0430 09/28/14 0752   09/28/14 0500  piperacillin-tazobactam (ZOSYN) IVPB 2.25 g  Status:  Discontinued     2.25 g 100 mL/hr over 30 Minutes Intravenous 3 times per day 09/28/14 0430 09/28/14 0752   09/27/14 1000  lamiVUDine (EPIVIR) 10 MG/ML solution 50 mg     50 mg Oral Daily 09/26/14 1934     09/27/14 0700  Darunavir Ethanolate (PREZISTA) tablet 800 mg     800 mg Oral Daily with breakfast 09/26/14 1943     09/26/14 2200  raltegravir (ISENTRESS) tablet 400 mg  Status:  Discontinued     400 mg Oral 2 times daily 09/26/14 1943 09/26/14 1950   09/26/14 2200  raltegravir (ISENTRESS) tablet 400 mg     400 mg Oral 2 times daily 09/26/14 1934     09/26/14 2030  Darunavir Ethanolate (PREZISTA) tablet 800 mg  Status:  Discontinued     800 mg Oral Daily with breakfast 09/26/14 1934 09/26/14 1950   09/26/14 2030  ritonavir (NORVIR) tablet 100 mg     100 mg Oral Daily with supper 09/26/14 1934     09/26/14 2030  zidovudine (RETROVIR) capsule 300 mg     300 mg Oral Daily  09/26/14 1934     09/26/14 2030  lamiVUDine (EPIVIR) tablet 150 mg     150 mg Oral  Once 09/26/14 1934 09/26/14 2115   09/26/14 1945  lamivudine (EPIVIR) tablet 50 mg  Status:  Discontinued     50 mg Oral Daily 09/26/14 1943 09/26/14 1945   09/26/14 1945  zidovudine (RETROVIR) tablet 300 mg  Status:  Discontinued     300 mg Oral Daily 09/26/14 1943 09/26/14 1945   09/26/14 1945  ritonavir (NORVIR) tablet 100 mg  Status:  Discontinued     100 mg Oral Daily 09/26/14 1943 09/26/14 1951      Assessment: 49 year old AA male with HIV (well controlled) CKD on HD (being evaluated for transplant), chronic pain admitted with sudden onset of SOB. No clear signs of infection have been found but pneumonia is possible considering CXR. Patient  continues to have fevers 102.2 overnight with leukopenia (2.7). Patient originally started on broad abx with vancomycin and zosyn but later d/c'ed. New orders received to start septra for possible pneumocytis pneumonia. Will dose daily given patient is ESRD. Per renal note he will get dialysis today.  3/10 bld x2 - ngtd 3/10 urine -sent  Goal of Therapy:  Eradication of infection  Plan:  Septra 500mg  (5mg /kg) IV q24 hours - give doses after dialysis Follow up for improvement and ability to change to po  Erin Hearing PharmD., BCPS Clinical Pharmacist Pager [phone] 09/29/2014 8:16 AM

## 2014-09-29 NOTE — Discharge Summary (Signed)
Name: Darrell Johnson MRN: [id number] DOB: [birth year] 49 y.o. PCP: Dellia Nims, MD  Date of Admission: 09/26/2014 10:54 AM Date of Discharge: 10/02/2014 Attending Physician: No att. providers found  Discharge Diagnosis: Principal Problem:   Acute respiratory failure with hypercapnia Active Problems:   Human immunodeficiency virus (HIV) disease   Essential hypertension   Chronic pain disorder   End stage renal disease   Anemia in chronic kidney disease   Pulmonary edema   PCP (pneumocystis carinii pneumonia)   AIDS   Leukopenia   Influenza A (H1N1)  Discharge Medications:   Medication List    STOP taking these medications        amoxicillin 500 MG tablet  Commonly known as:  AMOXIL     furosemide 40 MG tablet  Commonly known as:  LASIX     furosemide 80 MG tablet  Commonly known as:  LASIX     naproxen 500 MG tablet  Commonly known as:  NAPROSYN      TAKE these medications        calcitRIOL 0.5 MCG capsule  Commonly known as:  ROCALTROL  Take 0.5 mcg by mouth daily.     carvedilol 25 MG tablet  Commonly known as:  COREG  Take 2 tablets (50 mg total) by mouth 2 (two) times daily with a meal.     Darunavir Ethanolate 800 MG tablet  Commonly known as:  PREZISTA  Take 1 tablet (800 mg total) by mouth daily with breakfast.     doxazosin 8 MG tablet  Commonly known as:  CARDURA  Take 8 mg by mouth at bedtime.     HYDROcodone-acetaminophen 10-325 MG per tablet  Commonly known as:  NORCO  Take 1 tablet by mouth every 8 (eight) hours as needed.     lamivudine 100 MG tablet  Commonly known as:  EPIVIR  Take 0.5 tablets (50 mg total) by mouth daily. Due to renal function.     montelukast 10 MG tablet  Commonly known as:  SINGULAIR  Take 10 mg by mouth 2 (two) times daily.     oseltamivir 30 MG capsule  Commonly known as:  TAMIFLU  Take 1 capsule (30 mg total) by mouth every other day. Take after dialysis on HD days     predniSONE 20 MG tablet   Commonly known as:  DELTASONE  Take 2 tablets (40 mg total) by mouth as directed.     raltegravir 400 MG tablet  Commonly known as:  ISENTRESS  Take 1 tablet (400 mg total) by mouth 2 (two) times daily.     ritonavir 100 MG Tabs tablet  Commonly known as:  NORVIR  Take 1 tablet (100 mg total) by mouth daily.     sulfamethoxazole-trimethoprim 800-160 MG per tablet  Commonly known as:  BACTRIM DS,SEPTRA DS  Take 3 tablets by mouth daily. 3 pills daily for 21 days, then 3 times weekly     ULORIC 80 MG Tabs  Generic drug:  Febuxostat  Take 80 mg by mouth daily.     zidovudine 300 MG tablet  Commonly known as:  RETROVIR  Take 1 tablet (300 mg total) by mouth daily.        Disposition and follow-up:   Mr.Tyvon D Lacross was discharged from [facility] in Stable condition.  At the hospital follow up visit please address:  1.  H1N1 vs Possible PCP: Please ensure that patient is symptomatically improving with no worsening cough or  fevers. Make sure to continue antibiotics and prednisone for full course. Then start on Prophylaxis Bactrim Dosing, may consider checking CD4.  .  Labs / imaging needed at time of follow-up: BMP  .  Pending labs/ test needing follow-up: Legionella urine antigen, Respiratory Viral Panel   Follow-up Appointments: Follow-up Information    Follow up with Dellia Nims, MD On 10/02/2014.   Specialty:  Internal Medicine   Why:  at 2:15pm   Contact information:   [address] [place] [id number] [phone]       Discharge Instructions: Discharge Instructions    Call MD for:  difficulty breathing, headache or visual disturbances    Complete by:  As directed      Call MD for:  extreme fatigue    Complete by:  As directed      Call MD for:  severe uncontrolled pain    Complete by:  As directed      Call MD for:  temperature >100.4    Complete by:  As directed      Diet - low sodium heart healthy    Complete by:  As directed       Discharge instructions    Complete by:  As directed   Please pick up and take your medications as directed.     Increase activity slowly    Complete by:  As directed            Consultations:  Nephrology   Procedures Performed:  Ct Abdomen Pelvis Wo Contrast  09/29/2014   CLINICAL DATA:  Fever of unknown origin 1 week. No abdominal complaints.  EXAM: CT ABDOMEN AND PELVIS WITHOUT CONTRAST  TECHNIQUE: Multidetector CT imaging of the abdomen and pelvis was performed following the standard protocol without IV contrast.  COMPARISON:  Chest CT today.  FINDINGS: The lung bases demonstrate no change in the bilateral nodular airspace process as described on the recent chest CT likely due to infection. Tiny amount of left pleural fluid. Old posterior right rib fractures.  Abdominal images demonstrate a normal liver, pancreas, adrenal glands and stomach. Gallbladder is contracted. Borderline splenomegaly unchanged. Kidneys are within normal without hydronephrosis or nephrolithiasis. Ureters are within normal. Appendix is normal. Minimal calcified plaque over the abdominal aorta. Small bowel and colon are within normal. There is a tiny amount of perihepatic fluid and minimal patchy free fluid over the lower abdomen.  Pelvic images demonstrate the bladder, prostate and rectosigmoid colon to the within normal.  IMPRESSION: Very minimal patchy free fluid within the abdomen. Otherwise, no acute findings.  Evidence of patient's known nodular airspace process over the lung bases compatible infection. Tiny amount of left pleural fluid.  Borderline splenomegaly.   Electronically Signed   By: Marin Olp M.D.   On: 09/29/2014 19:46   Dg Chest 2 View  09/28/2014   CLINICAL DATA:  Fever tonight.  EXAM: CHEST  2 VIEW  COMPARISON:  09/27/2014  FINDINGS: Cardiac enlargement with mild perihilar infiltration probably indicating edema. Small right pleural effusion with basilar atelectasis. Old right rib fractures.  Degenerative changes in the spine. No pneumothorax.  IMPRESSION: Cardiac enlargement with mild perihilar edema. Small right pleural effusion.   Electronically Signed   By: Lucienne Capers M.D.   On: 09/28/2014 02:34   Dg Chest 2 View  09/27/2014   CLINICAL DATA:  Subsequent encounter for pulmonary edema  EXAM: CHEST  2 VIEW  COMPARISON:  09/26/2014.  FINDINGS: Two view exam shows vascular congestion with  probable interstitial pulmonary edema. Imaging features have improved in the interval. Small right pleural effusion persist. The cardio pericardial silhouette is enlarged. Telemetry leads overlie the chest.  IMPRESSION: Interval improvement in pulmonary edema pattern.  Persistent small right pleural effusion.   Electronically Signed   By: Misty Stanley M.D.   On: 09/27/2014 17:22   Ct Chest Wo Contrast  09/28/2014   CLINICAL DATA:  Fever and shortness of breath.  EXAM: CT CHEST WITHOUT CONTRAST  TECHNIQUE: Multidetector CT imaging of the chest was performed following the standard protocol without IV contrast.  COMPARISON:  Chest 09/28/2014  FINDINGS: Mild cardiac enlargement. Normal caliber thoracic aorta. Scattered mediastinal lymph nodes are not pathologically enlarged. Esophagus is decompressed.  There is diffuse nodular patchy perihilar infiltration in both lungs. This could represent multifocal pneumonia or airspace edema. Minimal left pleural effusion. No pneumothorax. Airways appear patent.  Included portions of the upper abdominal organs are grossly unremarkable. Old healed right rib fractures. Calcifications in the soft tissues adjacent to the right shoulder possibly representing chondrocalcinosis.  IMPRESSION: Patchy nodular perihilar infiltration bilaterally consistent with multifocal pneumonia versus airspace edema.   Electronically Signed   By: Lucienne Capers M.D.   On: 09/28/2014 21:20   Dg Chest Port 1 View  09/26/2014   CLINICAL DATA:  49 year old male cyst with shortness of breath and  hypoxia.  EXAM: PORTABLE CHEST - 1 VIEW  COMPARISON:  Prior chest x-ray 12/02/2013  FINDINGS: Increased bilateral interstitial and airspace opacities throughout both lungs most consistent with pulmonary edema. Borderline cardiomegaly. Chronic blunting of the right costophrenic angle new similar compared to prior. No acute osseous abnormality. No pneumothorax.  IMPRESSION: 1. Borderline cardiomegaly and pulmonary edema. Differential considerations include congestive heart failure and volume overload given the history of end-stage renal disease on hemodialysis. 2. Stable chronic blunting of the right costophrenic angle. A small pleural effusion is difficult to exclude radiographically.   Electronically Signed   By: Jacqulynn Cadet M.D.   On: 09/26/2014 12:00    2D Echo: Study Conclusions  - Left ventricle: The cavity size was normal. There was mild concentric hypertrophy. Systolic function was normal. The estimated ejection fraction was in the range of 55% to 60%. Wall motion was normal; there were no regional wall motion abnormalities. - Aortic valve: Valve area (Vmax): 2.07 cm^2. - Mitral valve: There was mild regurgitation. - Left atrium: The atrium was moderately dilated. - Pulmonary arteries: Systolic pressure was moderately increased. PA peak pressure: 50 mm Hg (S).  Cardiac Cath: Not performed  Admission HPI: AIMAN HECKSEL is a 49 yo M with PMH of ESRD recently started on HD (TTS) within the past month, HIV (last vL undect, CD4 230), HTN, and chronic pain who presents with a 1 day history of SOB. In the ED initial blood work was significant for an ABG which revealed an acute respiratory acidosis with hypercapnia. He reports that last night he started coughing and not feeling well. He and his wife note that he has been more tired lately which he attributes to moving furniture around his house. His wife ultimately decided to bring him in to the ED this morning as he continued  to "not feel well" and he was very somnolent. Of note he denies any recent medication changes. He is presribed hydrocodone for chronic pain but initially denied any increase in pain medication (he later told my attending Dr. Tommy Medal that he had borrowed some oxycodone for dental pain recently). He does have  a history of seizures but is not currently on seizure medications, he denies any recent seizure activity. He also admits that he has not been taking his lasix.  In the ED he was placed on BiPAP with improvement of his respiratory status. He was also placed on a nitro drip for his hypertension.  Hospital Course by problem list: Principal Problem:   Acute respiratory failure with hypercapnia Active Problems:   Human immunodeficiency virus (HIV) disease   Essential hypertension   Chronic pain disorder   End stage renal disease   Anemia in chronic kidney disease   Pulmonary edema   PCP (pneumocystis carinii pneumonia)   AIDS   Leukopenia   Influenza A (H1N1)   HOSPITAL COURSE  Mr. Lipson is a 49 year old AAM with ESRD on dialysis (T, R, Sat) , HTN, HIV, and chronic pain who presented with acute respiratory failure and was found to have H1N1 influenza and possible PCP pneumonia.   Acute Hypoxic and Hypercapnic Respiratory Failure likely multifactorial due to H1N1 Influenza, ESRD with volume overload, Possible Accidental Narcotic Overdose, and possible PCP pneumonia: On presentation, the patient was found to be lethargic with hypercapneic respiratory acidosis and chest xray demonstrated pulmonary edema consistent with fluid overload. Etiology of this acute respiratory failure was likely secondary to volume overload in ESRD with accidental narcotic overdose (had cough and took 3 Norco 10-325 with robotussin). He was dialyzed daily during hospitalization with improvement in respiratory status.  With diaysis sessions, the patient was successfully weaned from bipap to room air. Echo 09/27/13  showed EF 55-60%; mild LVH and LA dilation with no significant dysfunction. Serial CXRs show improvement although still has mild right pleural effusion that was evaluated by CT chest and found to be stable and non-infectious. Nephrology determined new dry weight of 99.8 kg.  Later due to investigation of his recurrent fevers he was found to have H1N1 and leukopenia, a CD4 count was checked and was found to be low as well as a CT scan of his chest showed a possible multifocal pneumonia so he was also started on treatment for possible PCP pneumonia. On discharge, the patient had returned to baseline respiratory status.   H1N1 influenza with possible PCP pneumonia : Pt was intermittently febrile on HOD 2 and 3 with PCT of 22 and LDH 318 despite being asymptomatic and improving from a respiratory standpoint. CT chest/abd/pelvis was obtained to evaluate for a source of infection. CT chest 09/28/14 showed bilateral patchy infiltrates concerning for multifocal PNA vs edema. In the setting of CD4 count of 70, neutropenia, patchy infiltrates and fevers in an HIV positive patient, we initiated treatment for PCP. The patient was started Septra 500mg  (5mg /kg) IV q24 hours. He was transitioned to oral bactrim at a dose of 3 DS tablets of bactrim daily, per pharmacy. We also treated him with prednisone 40 mg BID.  Influenza viral PCR then found the patient to be H1N1 positive.  He was started on oseltamavir 30 mg on 09/29/14. We did continued his treatment for PCP as well given the clinical picture. CT abd/pelvis was negative for acute infectious intrabdominal process. Urine strep Ag was negative. Blood cultures remained negative. Respiratory virus panel was pending at time of discharge. He will be discharged with 20 days of Bactrim (3 double strength tablets daily) to complete a  21 day course for treatment of possible PCP. He will be discharged with a prednisone taper to complete a 21 day course. He will also be discharged  oseltamavir 30 mg to complete a 5 day course, with every other day dosing. Next dose will be Monday and last dose Tuesday, after dialysis.   Hypertension: Pt was hypertensive with headache on arrival. He was started on a nitro drip in the ED. He was ultimately weaned from the nitro drip as pressures improved and was restarted on home medications. He will continue home meds of carvedilol 25 mg BID and doxazosin 8 mg on discharge.   ESRD Patient on started Tuesday, Thursday, Saturday hemodialysis last month. He was dialyzed daily while hospitalized for symptomatic fluid overload with clinical improvement.. Nephrology followed during hospitalization and set a new dry weight at 99.8 kg.   HIV, chronic infection: Pt is HIV + with CD4 count drop to 70 during this admission from 230 on 07/2014. Viral load remained undetectable. Pt was continued on home regimen of Regimvir 400 mg BID, Ritonavir 100 mg, zidovudine 300 mg, and prezista 800 mg. Medication adjustments were discussed with Dr. Tommy Medal.   Chronic pain: Pt has history of norco 10-325 mg TID for chronic pain. However, he states he ran out of these medications and has not gotten them refilled. On admssion, UDS negative. He was not discharged with any pain medications.   Tobacco Abuse: Pt smokes 1/2 ppd x 35 years.  He was given a nicotine patch 10g during hospitalization and was counseled on benefits of cessation.   Discharge Vitals:   BP 142/80 mmHg  Pulse 78  Temp(Src) 99 F (37.2 C) (Oral)  Resp 18  Ht 5\' 10"  (1.778 m)  Wt 220 lb 0.3 oz (99.8 kg)  BMI 31.57 kg/m2  SpO2 96%  Discharge Labs:  No results found for this or any previous visit (from the past 24 hour(s)).  Signed: Lucious Groves, DO 10/02/2014, 8:31 AM    Services Ordered on Discharge:  None Equipment Ordered on Discharge: None

## 2014-09-29 NOTE — Progress Notes (Signed)
  [facility] Progress Note   Subjective: cough is improving, CT and CXR yest show diffuse edema vs infection pattern. Still spiking temps  Filed Vitals:   09/28/14 2003 09/28/14 2144 09/29/14 0010 09/29/14 0503  BP: 140/67 136/67  145/75  Pulse: 88   85  Temp: 100.4 F (38 C)  99.1 F (37.3 C) 98.1 F (36.7 C)  TempSrc: Oral   Oral  Resp: 18   18  Height:      Weight:    102.967 kg (227 lb)  SpO2: 100%   98%   Exam: General: no distress Neck: Supple. JVD not elevated. Lungs: bilat basilar coarse scattered rales and wheezing Heart: RRR with S1 S2 Abdomen: Soft, obese. non-tender, non-distended Ext: trace LE edema Neuro: Alert and oriented X 3. Moves all extremities spontaneously. Dialysis Access: R AVF +b.t  Dialysis Orders: TTS [place] 4 hrs 104.5kgs 2k/2.5Ca+ 6000u heparin/3000 mid Aranesp 100 q week venofer 100mg  q week hectorol 3   Assessment: 1. Resp distress - pulm edema + infection/ fever. Still has vol overload but improving 2. Fever - prob resp infection, per primary team 3. ESRD - TTS south 4. Hypertension/volume - BP better , on home meds 5. Anemia - hgb 12.2- cont weekly ESA and Fe 6. Metabolic bone disease - Cont hectorol. Last PTH 940/phos 5.6 7. Nutrition - NPO- will need renal diet. supplements  8. HIV- cont home meds 9. Current smoker   Plan- extra HD today and HD tomorrow, get volume down further    Kelly Splinter MD  pager [phone]    cell [phone]  09/29/2014, 7:57 AM     Recent Labs Lab 09/26/14 1110 09/26/14 1116 09/28/14 0530 09/28/14 0900  NA 143 139 138 137  K 6.0* 5.5* 5.5* 4.9  CL 105 105 104 102  CO2 15*  --  23 25  GLUCOSE 135* 168* 95 138*  BUN 68* 69* 71* 71*  CREATININE 7.97* 7.20* 7.78* 7.61*  CALCIUM 9.0  --  8.4 8.2*  PHOS 5.9*  --  6.1*  --     Recent Labs Lab 09/26/14 1110 09/28/14 0530  ALBUMIN 3.8 2.8*    Recent Labs Lab 09/26/14 1110 09/26/14 1116 09/28/14 0205  09/28/14 0530  WBC 12.1*  --  2.9* 2.7*  NEUTROABS 9.3*  --   --  2.2  HGB 10.8* 12.2* 8.4* 8.1*  HCT 33.1* 36.0* 25.5* 25.1*  MCV 115.7*  --  112.3* 111.1*  PLT 163  --  100* 94*   . calcitRIOL  0.5 mcg Oral Daily  . carvedilol  25 mg Oral BID WC  . darbepoetin (ARANESP) injection - DIALYSIS  100 mcg Intravenous Q Thu-HD  . Darunavir Ethanolate  800 mg Oral Q breakfast  . doxazosin  8 mg Oral QHS  . doxercalciferol  3 mcg Intravenous Q T,Th,Sa-HD  . febuxostat  80 mg Oral Daily  . ferric gluconate (FERRLECIT/NULECIT) IV  125 mg Intravenous Q Thu-HD  . lamiVUDine  50 mg Oral Daily  . montelukast  10 mg Oral QHS  . multivitamin  1 tablet Oral QHS  . predniSONE  40 mg Oral BID WC  . raltegravir  400 mg Oral BID  . ritonavir  100 mg Oral Q supper  . sodium chloride  3 mL Intravenous Q12H  . zidovudine  300 mg Oral Daily     acetaminophen **OR** acetaminophen, benzonatate, feeding supplement (NEPRO CARB STEADY)

## 2014-09-30 DIAGNOSIS — D631 Anemia in chronic kidney disease: Secondary | ICD-10-CM

## 2014-09-30 DIAGNOSIS — G8929 Other chronic pain: Secondary | ICD-10-CM

## 2014-09-30 DIAGNOSIS — J811 Chronic pulmonary edema: Secondary | ICD-10-CM

## 2014-09-30 DIAGNOSIS — J101 Influenza due to other identified influenza virus with other respiratory manifestations: Secondary | ICD-10-CM

## 2014-09-30 LAB — CBC
HCT: 26.1 % — ABNORMAL LOW (ref 39.0–52.0)
HEMOGLOBIN: 8.5 g/dL — AB (ref 13.0–17.0)
MCH: 35.7 pg — ABNORMAL HIGH (ref 26.0–34.0)
MCHC: 32.6 g/dL (ref 30.0–36.0)
MCV: 109.7 fL — AB (ref 78.0–100.0)
Platelets: 105 10*3/uL — ABNORMAL LOW (ref 150–400)
RBC: 2.38 MIL/uL — AB (ref 4.22–5.81)
RDW: 15.6 % — ABNORMAL HIGH (ref 11.5–15.5)
WBC: 2.6 10*3/uL — ABNORMAL LOW (ref 4.0–10.5)

## 2014-09-30 LAB — PROCALCITONIN: Procalcitonin: 11.64 ng/mL

## 2014-09-30 MED ORDER — OSELTAMIVIR PHOSPHATE 30 MG PO CAPS
30.0000 mg | ORAL_CAPSULE | ORAL | Status: DC
  Administered 2014-09-30: 30 mg via ORAL
  Filled 2014-09-30: qty 1

## 2014-09-30 MED ORDER — OSELTAMIVIR PHOSPHATE 30 MG PO CAPS: 30.0000 mg | ORAL_CAPSULE | ORAL | Status: DC

## 2014-09-30 MED ORDER — PREDNISONE 20 MG PO TABS: 40.0000 mg | ORAL_TABLET | ORAL | Status: DC

## 2014-09-30 MED ORDER — SULFAMETHOXAZOLE-TRIMETHOPRIM 800-160 MG PO TABS: 3.0000 | ORAL_TABLET | Freq: Every day | ORAL | Status: DC

## 2014-09-30 MED ORDER — DOXERCALCIFEROL 4 MCG/2ML IV SOLN
INTRAVENOUS | Status: AC
  Filled 2014-09-30: qty 2

## 2014-09-30 MED ORDER — SULFAMETHOXAZOLE-TRIMETHOPRIM 800-160 MG PO TABS
3.0000 | ORAL_TABLET | Freq: Every day | ORAL | Status: DC
Start: 2014-09-30 — End: 2014-09-30

## 2014-09-30 NOTE — Progress Notes (Signed)
  [facility] Progress Note   Subjective: cough , temps improving  Filed Vitals:   09/30/14 0830 09/30/14 0900 09/30/14 0930 09/30/14 1000  BP: 166/77 167/76 155/76 131/73  Pulse: 73 73 76 82  Temp:      TempSrc:      Resp: 19 21 21 19   Height:      Weight:      SpO2:       Exam: General: no distress Neck: Supple. JVD not elevated. Lungs: bilat basilar coarse scattered rales and wheezing Heart: RRR with S1 S2 Abdomen: Soft, obese. non-tender, non-distended Ext: trace LE edema Neuro: Alert and oriented X 3. Moves all extremities spontaneously. Dialysis Access: R AVF +b.t  Dialysis Orders: [facility] 4 hrs 104.5kgs 2k/2.5Ca+ 6000u heparin/3000 mid Aranesp 100 q week venofer 100mg  q week hectorol 3   Assessment: 1. Fever / pulm infiltrates/ pulm edema / resp distress / H1N1 - improving 2. ESRD - TTS south 3. Hypertension/volume - BP stable. Will need lower dry wt.  4. Anemia - hgb 12.2- cont weekly ESA and Fe 5. Metabolic bone disease - Cont hectorol. Last PTH 940/phos 5.6 6. Nutrition - NPO- will need renal diet. supplements  7. HIV- cont home meds   Plan- HD today, lower dry wt    Kelly Splinter MD  pager [phone]    cell [phone]  09/30/2014, 11:18 AM     Recent Labs Lab 09/26/14 1110 09/26/14 1116 09/28/14 0530 09/28/14 0900  NA 143 139 138 137  K 6.0* 5.5* 5.5* 4.9  CL 105 105 104 102  CO2 15*  --  23 25  GLUCOSE 135* 168* 95 138*  BUN 68* 69* 71* 71*  CREATININE 7.97* 7.20* 7.78* 7.61*  CALCIUM 9.0  --  8.4 8.2*  PHOS 5.9*  --  6.1*  --     Recent Labs Lab 09/26/14 1110 09/28/14 0530  ALBUMIN 3.8 2.8*    Recent Labs Lab 09/26/14 1110  09/28/14 0205 09/28/14 0530 09/30/14 0403  WBC 12.1*  --  2.9* 2.7* 2.6*  NEUTROABS 9.3*  --   --  2.2  --   HGB 10.8*  < > 8.4* 8.1* 8.5*  HCT 33.1*  < > 25.5* 25.1* 26.1*  MCV 115.7*  --  112.3* 111.1* 109.7*  PLT 163  --  100* 94* 105*  < > = values in this interval  not displayed. . carvedilol  25 mg Oral BID WC  . darbepoetin (ARANESP) injection - DIALYSIS  100 mcg Intravenous Q Thu-HD  . Darunavir Ethanolate  800 mg Oral Q breakfast  . doxazosin  8 mg Oral QHS  . doxercalciferol      . doxercalciferol  3 mcg Intravenous Q T,Th,Sa-HD  . febuxostat  80 mg Oral Daily  . ferric gluconate (FERRLECIT/NULECIT) IV  125 mg Intravenous Q Thu-HD  . lamiVUDine  50 mg Oral Daily  . montelukast  10 mg Oral QHS  . multivitamin  1 tablet Oral QHS  . oseltamivir  30 mg Oral Daily  . predniSONE  40 mg Oral BID WC  . raltegravir  400 mg Oral BID  . ritonavir  100 mg Oral Q supper  . sodium chloride  3 mL Intravenous Q12H  . sulfamethoxazole-trimethoprim  500 mg Intravenous Q24H  . zidovudine  300 mg Oral Daily     acetaminophen **OR** acetaminophen, benzonatate, feeding supplement (NEPRO CARB STEADY)

## 2014-09-30 NOTE — Progress Notes (Signed)
PHARMACY NOTE  Pharmacy Consult for :  Bactrim Indication:  Empiric Treatment of PCP in HD patient and H1N1 +  Hospital Problems Principal Problem:   Acute respiratory failure with hypercapnia Active Problems:   Human immunodeficiency virus (HIV) disease   Essential hypertension   Chronic pain disorder   End stage renal disease   Anemia in chronic kidney disease   Pulmonary edema   PCP (pneumocystis carinii pneumonia)   AIDS   Leukopenia   Influenza A (H1N1)   Dosing Weight: 100 kg  Currently:  98.6 F (37 C) (Oral) ,   Lab Results  Component Value Date   WBC 2.6* 09/30/2014    Recent Labs  09/28/14 0530 09/28/14 0900 09/30/14 0403  WBC 2.7*  --  2.6*  HGB 8.1*  --  8.5*  PLT 94*  --  105*  CREATININE 7.78* 7.61*  --     Estimated Creatinine Clearance: 13.9 mL/min (by C-G formula based on Cr of 7.61).   Microbiology: Recent Results (from the past 720 hour(s))  MRSA PCR Screening     Status: None   Collection Time: 09/26/14  9:30 PM  Result Value Ref Range Status   MRSA by PCR NEGATIVE NEGATIVE Final  Culture, blood (routine x 2)     Status: None (Preliminary result)   Collection Time: 09/28/14  1:55 AM  Result Value Ref Range Status   Specimen Description BLOOD LEFT ARM  Final   Special Requests   Final    BOTTLES DRAWN AEROBIC AND ANAEROBIC 10CC AERO 5CC ANA   Culture   Final           BLOOD CULTURE RECEIVED NO GROWTH TO DATE CULTURE WILL BE HELD FOR 5 DAYS BEFORE ISSUING A FINAL NEGATIVE REPORT Performed at [facility]    Report Status PENDING  Incomplete  Culture, Urine     Status: None   Collection Time: 09/28/14  1:55 AM  Result Value Ref Range Status   Specimen Description URINE, CLEAN CATCH  Final   Special Requests NONE  Final   Colony Count   Final    25,000 COLONIES/ML Performed at [facility]    Culture   Final    Multiple bacterial morphotypes present, none predominant. Suggest appropriate recollection  if clinically indicated. Performed at [facility]    Report Status 09/29/2014 FINAL  Final  Culture, blood (routine x 2)     Status: None (Preliminary result)   Collection Time: 09/28/14  2:05 AM  Result Value Ref Range Status   Specimen Description BLOOD LEFT HAND  Final   Special Requests BOTTLES DRAWN AEROBIC ONLY 10CC  Final   Culture   Final           BLOOD CULTURE RECEIVED NO GROWTH TO DATE CULTURE WILL BE HELD FOR 5 DAYS BEFORE ISSUING A FINAL NEGATIVE REPORT Note: Culture results may be compromised due to an excessive volume of blood received in culture bottles. Performed at [facility]    Report Status PENDING  Incomplete   Lab Results  Component Value Date   CULT  09/28/2014           BLOOD CULTURE RECEIVED NO GROWTH TO DATE CULTURE WILL BE HELD FOR 5 DAYS BEFORE ISSUING A FINAL NEGATIVE REPORT Note: Culture results may be compromised due to an excessive volume of blood received in culture bottles. Performed at [facility]  09/28/2014  BLOOD CULTURE RECEIVED NO GROWTH TO DATE CULTURE WILL BE HELD FOR 5 DAYS BEFORE ISSUING A FINAL NEGATIVE REPORT Performed at [place]  09/28/2014    Multiple bacterial morphotypes present, none predominant. Suggest appropriate recollection if clinically indicated. Performed at [facility]     Current Medication[s] Include: Prior to Admission: Prescriptions prior to admission  Medication Sig Dispense Refill Last Dose  . calcitRIOL (ROCALTROL) 0.5 MCG capsule Take 0.5 mcg by mouth daily.   09/25/2014 at Unknown time  . carvedilol (COREG) 25 MG tablet Take 2 tablets (50 mg total) by mouth 2 (two) times daily with a meal. 120 tablet 11 09/26/2014 at 0800  . Darunavir Ethanolate (PREZISTA) 800 MG tablet Take 1 tablet (800 mg total) by mouth daily with breakfast. 30 tablet 5 09/25/2014 at Unknown time  . doxazosin (CARDURA) 8 MG tablet Take 8 mg by mouth at bedtime.    09/25/2014 at Unknown time  . Febuxostat (ULORIC) 80 MG TABS Take 80 mg by mouth daily.   09/25/2014 at Unknown time  . furosemide (LASIX) 80 MG tablet Take 160 mg by mouth 2 (two) times daily.   09/25/2014 at Unknown time  . HYDROcodone-acetaminophen (NORCO) 10-325 MG per tablet Take 1 tablet by mouth every 8 (eight) hours as needed. 30 tablet 0 Past Month at Unknown time  . lamivudine (EPIVIR) 100 MG tablet Take 0.5 tablets (50 mg total) by mouth daily. Due to renal function. 15 tablet 5 09/25/2014 at Unknown time  . montelukast (SINGULAIR) 10 MG tablet Take 10 mg by mouth 2 (two) times daily.   Past Month at Unknown time  . predniSONE (DELTASONE) 5 MG tablet Take 5 mg by mouth daily with breakfast.   09/25/2014 at Unknown time  . raltegravir (ISENTRESS) 400 MG tablet Take 1 tablet (400 mg total) by mouth 2 (two) times daily. 60 tablet 5 09/25/2014 at Unknown time  . ritonavir (NORVIR) 100 MG TABS tablet Take 1 tablet (100 mg total) by mouth daily. 30 tablet 5 09/25/2014 at Unknown time  . zidovudine (RETROVIR) 300 MG tablet Take 1 tablet (300 mg total) by mouth daily. 30 tablet 11 09/25/2014 at Unknown time  . amoxicillin (AMOXIL) 500 MG tablet Take 1 tablet (500 mg total) by mouth daily. (Patient not taking: Reported on 09/26/2014) 7 tablet 0   . furosemide (LASIX) 40 MG tablet Take 120 mg by mouth 2 (two) times daily.      . naproxen (NAPROSYN) 500 MG tablet Take 1 tablet (500 mg total) by mouth 2 (two) times daily. (Patient not taking: Reported on 09/26/2014) 30 tablet 0 Taking    Scheduled:  Scheduled:  . carvedilol  25 mg Oral BID WC  . darbepoetin (ARANESP) injection - DIALYSIS  100 mcg Intravenous Q Thu-HD  . Darunavir Ethanolate  800 mg Oral Q breakfast  . doxazosin  8 mg Oral QHS  . doxercalciferol      . doxercalciferol  3 mcg Intravenous Q T,Th,Sa-HD  . febuxostat  80 mg Oral Daily  . ferric gluconate (FERRLECIT/NULECIT) IV  125 mg Intravenous Q Thu-HD  . lamiVUDine  50 mg Oral Daily  .  montelukast  10 mg Oral QHS  . multivitamin  1 tablet Oral QHS  . oseltamivir  30 mg Oral Once per day on Mon Wed Sat  . predniSONE  40 mg Oral BID WC  . raltegravir  400 mg Oral BID  . ritonavir  100 mg Oral Q supper  .  sodium chloride  3 mL Intravenous Q12H  . sulfamethoxazole-trimethoprim  500 mg Intravenous Q24H  . zidovudine  300 mg Oral Daily    Infusion[s]: Infusions:    Antibiotic[s]: Anti-infectives    Start     Dose/Rate Route Frequency Ordered Stop   09/30/14 1500  oseltamivir (TAMIFLU) capsule 30 mg     30 mg Oral Once per day on Mon Wed Sat 09/30/14 1414 10/07/14 1459   09/29/14 1500  oseltamivir (TAMIFLU) capsule 30 mg  Status:  Discontinued     30 mg Oral Daily 09/29/14 1354 09/30/14 1414   09/29/14 1200  sulfamethoxazole-trimethoprim (BACTRIM) 500 mg in dextrose 5 % 500 mL IVPB     500 mg 354.2 mL/hr over 90 Minutes Intravenous Every 24 hours 09/29/14 0806     09/28/14 1200  vancomycin (VANCOCIN) IVPB 1000 mg/200 mL premix  Status:  Discontinued     1,000 mg 200 mL/hr over 60 Minutes Intravenous Every T-Th-Sa (Hemodialysis) 09/28/14 0430 09/28/14 0752   09/28/14 0530  vancomycin (VANCOCIN) 2,000 mg in sodium chloride 0.9 % 500 mL IVPB  Status:  Discontinued     2,000 mg 250 mL/hr over 120 Minutes Intravenous  Once 09/28/14 0430 09/28/14 0752   09/28/14 0500  piperacillin-tazobactam (ZOSYN) IVPB 2.25 g  Status:  Discontinued     2.25 g 100 mL/hr over 30 Minutes Intravenous 3 times per day 09/28/14 0430 09/28/14 0752   09/27/14 1000  lamiVUDine (EPIVIR) 10 MG/ML solution 50 mg     50 mg Oral Daily 09/26/14 1934     09/27/14 0700  Darunavir Ethanolate (PREZISTA) tablet 800 mg     800 mg Oral Daily with breakfast 09/26/14 1943     09/26/14 2200  raltegravir (ISENTRESS) tablet 400 mg  Status:  Discontinued     400 mg Oral 2 times daily 09/26/14 1943 09/26/14 1950   09/26/14 2200  raltegravir (ISENTRESS) tablet 400 mg     400 mg Oral 2 times daily 09/26/14 1934      09/26/14 2030  Darunavir Ethanolate (PREZISTA) tablet 800 mg  Status:  Discontinued     800 mg Oral Daily with breakfast 09/26/14 1934 09/26/14 1950   09/26/14 2030  ritonavir (NORVIR) tablet 100 mg     100 mg Oral Daily with supper 09/26/14 1934     09/26/14 2030  zidovudine (RETROVIR) capsule 300 mg     300 mg Oral Daily 09/26/14 1934     09/26/14 2030  lamiVUDine (EPIVIR) tablet 150 mg     150 mg Oral  Once 09/26/14 1934 09/26/14 2115   09/26/14 1945  lamivudine (EPIVIR) tablet 50 mg  Status:  Discontinued     50 mg Oral Daily 09/26/14 1943 09/26/14 1945   09/26/14 1945  zidovudine (RETROVIR) tablet 300 mg  Status:  Discontinued     300 mg Oral Daily 09/26/14 1943 09/26/14 1945   09/26/14 1945  ritonavir (NORVIR) tablet 100 mg  Status:  Discontinued     100 mg Oral Daily 09/26/14 1943 09/26/14 1951     Assessment:  49 y/o HIV patient with severe leukopenia who is being treated with Tamiflu for H1N1 influenza.    Patient has severe leukopenia, CD4 lymphopenia and acute respiratory illness.  Tamiflu to be continued for a 5 day [total] course.  Patient also on Bactrim per Pharmacy consult.  Patient is being dosed for empiric coverage of PCP.  Patient will receive a 21 day course of Bactrim as well as Steroids.  When ready to convert from IV to PO Bactrim for outpatient therapy, the dose of PO Bactrim is the same as the IV doses.  Dose of Bactrim in HD patient is 5 mg/kg q 24 hours.  Patient currently 100 kg = 500 mg Bactrim q 24 hours.  Goal of Therapy:  Bactrim dosed for clinical indication and adjusted for renal function.  Tamiflu adjusted for renal function, HD schedule..  Plan:  1. Tamiflu changed to 30 mg every other day after HD on HD days, next dose today. [done] 2. Continue IV Bactrim 500 mg q 24 hours.  Estelle June, Pharm.D.  09/30/2014 2:21 PM

## 2014-09-30 NOTE — Discharge Instructions (Signed)
Please keep you follow up appointment with the clinic on Monday at 2:15pm  I want you to take a dose Tamiflu on Monday and Tuesday.  Please take 3 pills of Bactrim DS daily for 21 days for PCP pneumonia  I also want you to take the following regimen of prednisone.  40mg  twice a day for 4 more days (including today)  Then 40mg  once a day for 5 days.  Then take 20mg  once a day for 11 days  Then you can drop down to 5mg  a day of prednisone.

## 2014-09-30 NOTE — Significant Event (Signed)
SATURATION QUALIFICATIONS: (This note is used to comply with regulatory documentation for home oxygen)  Patient Saturations on Room Air at Rest 98%  Patient Saturations on Room Air while Ambulating = 97%  Patient Saturations on N/A Liters of oxygen while Ambulating =N/A%  Please briefly explain why patient needs home oxygen: N/A

## 2014-09-30 NOTE — Progress Notes (Signed)
  Date: 09/30/2014  Patient name: Darrell Johnson  Medical record number: [id number]  Date of birth: 05/10/1966   This patient's plan of care was discussed with the house staff. Please see their note for complete details. I concur with their findings.  Pt feels better  Exam: Patient wearing mask while undergoing hemodialysis.  HEENT no cephalic atraumatic  Pulmonary improved aeration and less wheezes today.  Cardiovascular: Regular rate and rhythm no murmurs gallop or rubs heard  Extremities 2 + edema   A/P:  #1 Severe influenza: The flu could explain much of his presentation including his severe leukopenia CD4 lymphopenia and acute respiratory illness.   continue oseltamivir for 5 day course   #2 Multinodular leuk trase on chest CT with hypoxemia elevated LDH and a CD4 count of 70. While as I said it is possible to infer influenza for everything, I do not want to take chance of not empirically treating for PCP as well  Continue Bactrim and ask pharmacy for recommended oral dose that is renally adjusted for PCP treatment in HD pt  He will need 21 day course along with prednisone taper  #3 HIV: continue home regimen  #4 ESRD on HD: getting HD today but then should be ready until the week  #5 Disp: IF does not need home O2 can likely DC on oral abx today.    Truman Hayward, MD 09/30/2014, 12:07 PM

## 2014-09-30 NOTE — Progress Notes (Signed)
Subjective: [name]. Feeling well.   Objective: Vital signs in last 24 hours: Filed Vitals:   09/29/14 2208 09/30/14 0446 09/30/14 0700 09/30/14 0800  BP: 171/79 152/70 162/86 150/80  Pulse: 77 82 81 80  Temp: 98.9 F (37.2 C) 98.6 F (37 C) 97.6 F (36.4 C)   TempSrc: Oral Oral Oral   Resp: 17 18 22 19   Height:      Weight: 101.5 kg (223 lb 12.3 oz) 102.059 kg (225 lb) 103 kg (227 lb 1.2 oz)   SpO2: 100% 100% 94%    Weight change: -1.2 kg (-2 lb 10.3 oz)  Intake/Output Summary (Last 24 hours) at 09/30/14 0901 Last data filed at 09/30/14 0600  Gross per 24 hour  Intake 1441.25 ml  Output   3000 ml  Net -1558.75 ml   BP 150/80 mmHg  Pulse 80  Temp(Src) 97.6 F (36.4 C) (Oral)  Resp 19  Ht 5\' 10"  (1.778 m)  Wt 103 kg (227 lb 1.2 oz)  BMI 32.58 kg/m2  SpO2 94% General appearance: morbidly obese and obese AAM, nasal cannula  Lungs: rhonchorous lung sounds throughout, unchanged Heart: regular rate and rhythm, S1, S2 normal, no murmur, click, rub or gallop Abdomen: soft, non-tender; bowel sounds normal; no masses,  no organomegaly, obese Extremities: extremities normal, atraumatic, no cyanosis or edema    Lab Results: Basic Metabolic Panel:  Recent Labs  09/28/14 0530 09/28/14 0900  NA 138 137  K 5.5* 4.9  CL 104 102  CO2 23 25  GLUCOSE 95 138*  BUN 71* 71*  CREATININE 7.78* 7.61*  CALCIUM 8.4 8.2*  PHOS 6.1*  --    Liver Function Tests:  Recent Labs  09/28/14 0530  ALBUMIN 2.8*   No results for input(s): LIPASE, AMYLASE in the last 72 hours. No results for input(s): AMMONIA in the last 72 hours. CBC:  Recent Labs  09/28/14 0530 09/30/14 0403  WBC 2.7* 2.6*  NEUTROABS 2.2  --   HGB 8.1* 8.5*  HCT 25.1* 26.1*  MCV 111.1* 109.7*  PLT 94* 105*    Recent Labs  09/28/14 1015  FERRITIN 590*  TIBC 243  IRON 20*   Coagulation: No results for input(s): LABPROT, INR in the last 72 hours. Urine Drug Screen: Drugs of Abuse     Component  Value Date/Time   LABOPIA NONE DETECTED 09/28/2014 0155   LABOPIA PPS 11/12/2012 1155   COCAINSCRNUR NONE DETECTED 09/28/2014 0155   COCAINSCRNUR NEG 11/12/2012 1155   LABBENZ NONE DETECTED 09/28/2014 0155   LABBENZ NEG 11/12/2012 1155   AMPHETMU NONE DETECTED 09/28/2014 0155   AMPHETMU NEG 11/12/2012 1155   THCU NONE DETECTED 09/28/2014 0155   THCU NEG 11/12/2012 1155   LABBARB NONE DETECTED 09/28/2014 0155   LABBARB NEG 11/12/2012 1155     Micro Results: Recent Results (from the past 240 hour(s))  MRSA PCR Screening     Status: None   Collection Time: 09/26/14  9:30 PM  Result Value Ref Range Status   MRSA by PCR NEGATIVE NEGATIVE Final    Comment:        The GeneXpert MRSA Assay (FDA approved for NASAL specimens only), is one component of a comprehensive MRSA colonization surveillance program. It is not intended to diagnose MRSA infection nor to guide or monitor treatment for MRSA infections.   Culture, blood (routine x 2)     Status: None (Preliminary result)   Collection Time: 09/28/14  1:55 AM  Result Value Ref Range Status  Specimen Description BLOOD LEFT ARM  Final   Special Requests   Final    BOTTLES DRAWN AEROBIC AND ANAEROBIC 10CC AERO 5CC ANA   Culture   Final           BLOOD CULTURE RECEIVED NO GROWTH TO DATE CULTURE WILL BE HELD FOR 5 DAYS BEFORE ISSUING A FINAL NEGATIVE REPORT Performed at [facility]    Report Status PENDING  Incomplete  Culture, Urine     Status: None   Collection Time: 09/28/14  1:55 AM  Result Value Ref Range Status   Specimen Description URINE, CLEAN CATCH  Final   Special Requests NONE  Final   Colony Count   Final    25,000 COLONIES/ML Performed at [facility]    Culture   Final    Multiple bacterial morphotypes present, none predominant. Suggest appropriate recollection if clinically indicated. Performed at [facility]    Report Status 09/29/2014 FINAL  Final  Culture, blood (routine x 2)      Status: None (Preliminary result)   Collection Time: 09/28/14  2:05 AM  Result Value Ref Range Status   Specimen Description BLOOD LEFT HAND  Final   Special Requests BOTTLES DRAWN AEROBIC ONLY 10CC  Final   Culture   Final           BLOOD CULTURE RECEIVED NO GROWTH TO DATE CULTURE WILL BE HELD FOR 5 DAYS BEFORE ISSUING A FINAL NEGATIVE REPORT Note: Culture results may be compromised due to an excessive volume of blood received in culture bottles. Performed at [facility]    Report Status PENDING  Incomplete   Studies/Results: Ct Abdomen Pelvis Wo Contrast  09/29/2014   CLINICAL DATA:  Fever of unknown origin 1 week. No abdominal complaints.  EXAM: CT ABDOMEN AND PELVIS WITHOUT CONTRAST  TECHNIQUE: Multidetector CT imaging of the abdomen and pelvis was performed following the standard protocol without IV contrast.  COMPARISON:  Chest CT today.  FINDINGS: The lung bases demonstrate no change in the bilateral nodular airspace process as described on the recent chest CT likely due to infection. Tiny amount of left pleural fluid. Old posterior right rib fractures.  Abdominal images demonstrate a normal liver, pancreas, adrenal glands and stomach. Gallbladder is contracted. Borderline splenomegaly unchanged. Kidneys are within normal without hydronephrosis or nephrolithiasis. Ureters are within normal. Appendix is normal. Minimal calcified plaque over the abdominal aorta. Small bowel and colon are within normal. There is a tiny amount of perihepatic fluid and minimal patchy free fluid over the lower abdomen.  Pelvic images demonstrate the bladder, prostate and rectosigmoid colon to the within normal.  IMPRESSION: Very minimal patchy free fluid within the abdomen. Otherwise, no acute findings.  Evidence of patient's known nodular airspace process over the lung bases compatible infection. Tiny amount of left pleural fluid.  Borderline splenomegaly.   Electronically Signed   By: Marin Olp M.D.    On: 09/29/2014 19:46   Ct Chest Wo Contrast  09/28/2014   CLINICAL DATA:  Fever and shortness of breath.  EXAM: CT CHEST WITHOUT CONTRAST  TECHNIQUE: Multidetector CT imaging of the chest was performed following the standard protocol without IV contrast.  COMPARISON:  Chest 09/28/2014  FINDINGS: Mild cardiac enlargement. Normal caliber thoracic aorta. Scattered mediastinal lymph nodes are not pathologically enlarged. Esophagus is decompressed.  There is diffuse nodular patchy perihilar infiltration in both lungs. This could represent multifocal pneumonia or airspace edema. Minimal left pleural effusion. No pneumothorax. Airways appear  patent.  Included portions of the upper abdominal organs are grossly unremarkable. Old healed right rib fractures. Calcifications in the soft tissues adjacent to the right shoulder possibly representing chondrocalcinosis.  IMPRESSION: Patchy nodular perihilar infiltration bilaterally consistent with multifocal pneumonia versus airspace edema.   Electronically Signed   By: Lucienne Capers M.D.   On: 09/28/2014 21:20   Medications: I have reviewed the patient's current medications. Scheduled Meds: . carvedilol  25 mg Oral BID WC  . darbepoetin (ARANESP) injection - DIALYSIS  100 mcg Intravenous Q Thu-HD  . Darunavir Ethanolate  800 mg Oral Q breakfast  . doxazosin  8 mg Oral QHS  . doxercalciferol      . doxercalciferol  3 mcg Intravenous Q T,Th,Sa-HD  . febuxostat  80 mg Oral Daily  . ferric gluconate (FERRLECIT/NULECIT) IV  125 mg Intravenous Q Thu-HD  . lamiVUDine  50 mg Oral Daily  . montelukast  10 mg Oral QHS  . multivitamin  1 tablet Oral QHS  . oseltamivir  30 mg Oral Daily  . predniSONE  40 mg Oral BID WC  . raltegravir  400 mg Oral BID  . ritonavir  100 mg Oral Q supper  . sodium chloride  3 mL Intravenous Q12H  . sulfamethoxazole-trimethoprim  500 mg Intravenous Q24H  . zidovudine  300 mg Oral Daily   Continuous Infusions:  PRN Meds:.acetaminophen  **OR** acetaminophen, benzonatate, feeding supplement (NEPRO CARB STEADY) Assessment/Plan: Principal Problem:   Acute respiratory failure with hypercapnia Active Problems:   Human immunodeficiency virus (HIV) disease   Essential hypertension   Chronic pain disorder   End stage renal disease   Anemia in chronic kidney disease   Acute pulmonary edema   Severe hypertension   Respiratory distress   Shortness of breath   SIRS (systemic inflammatory response syndrome)   Pulmonary edema   PCP (pneumocystis carinii pneumonia)   Fever of unknown origin (FUO)   AIDS   Leukopenia   Influenza A (H1N1)  Mr. Samaniego is a 49 year old gentleman with past medical history of ESRD on HD, HIV/AIDS, severe hypertension among other health problems presented with respiratory depression and increased cough.   Acute respiratory failure: Improving. Positive for H1N1. Chest CT scan from 09/26/2014 revealed patchy nodular perihilar infiltrates bilaterally consistent with multifocal pneumonia. There is a question of PCP due to his elevated LDH and pro-calcitonin. PCT now decreasing.  -Started on Bactrim IV. -Started on Prednisone -Start Tamiflu 30 mg daily for 5 days. Discussed with pharmacy about dosing in renal patient. -Supplemental oxygen via nasal cannula to keep O2 sats above 92%.  - Legionella urine antigen pending (needs to be collected)  Fevers: Influenza A, H1N1 positive. RVP pending. Fevers likely related to H1N1 flu. Blood cultures have been negative. CT abd/pelvis negative for acute infectious process. Afebrile x 24 hours this morning.  - Initiate tamiflu 30 mg  Daily - continue to monitor   HIV: cont with HIV medications.   ESRD: On HD TTS. No acute electrolyte abnormalities.  - continue dialysis (has been daily during hospitalization) - Nephro following   HTN: Better controlled to 150s/80s here. Continue with Cardura and Coreg.   Anemia/pancytopenie: Likely related to ESRD, with possible  increased suppression due to H1N1. Anemia panel shows high ferritin with low iron stores. Will monitor as needed.   F/E/N: renal diet   VTE Ppx: subq heparin  CODE STATUS: Full code.  Family Communication: Discussed with patient about plan of care (indicate person spoken with, relationship, and  if by phone, the number)  Disposition: Pending improvement in his respiratory status and fevers.   The patient does have current PCP (Ahmed, Tasrif, MD), therefore is require OPC follow-up after discharge.   The patient does not have transportation limitations that hinder transportation to clinic appointments.  This is a Careers information officer Note.  The care of the patient was discussed with Dr. Heber [place] and the assessment and plan formulated with their assistance.  Please see their attached note for official documentation of the daily encounter.   LOS: 4 days   Fritzi Mandes, Med Student 09/30/2014, 9:01 AM

## 2014-10-02 ENCOUNTER — Encounter: Payer: Self-pay | Admitting: Internal Medicine

## 2014-10-02 ENCOUNTER — Ambulatory Visit (INDEPENDENT_AMBULATORY_CARE_PROVIDER_SITE_OTHER): Payer: Medicare Other | Admitting: Internal Medicine

## 2014-10-02 VITALS — BP 155/83 | HR 69 | Temp 98.0°F | Wt 228.6 lb

## 2014-10-02 DIAGNOSIS — I10 Essential (primary) hypertension: Secondary | ICD-10-CM

## 2014-10-02 DIAGNOSIS — I120 Hypertensive chronic kidney disease with stage 5 chronic kidney disease or end stage renal disease: Secondary | ICD-10-CM

## 2014-10-02 DIAGNOSIS — J9602 Acute respiratory failure with hypercapnia: Secondary | ICD-10-CM | POA: Diagnosis not present

## 2014-10-02 DIAGNOSIS — N186 End stage renal disease: Secondary | ICD-10-CM | POA: Diagnosis not present

## 2014-10-02 DIAGNOSIS — B20 Human immunodeficiency virus [HIV] disease: Secondary | ICD-10-CM | POA: Diagnosis not present

## 2014-10-02 LAB — BASIC METABOLIC PANEL
Anion gap: 15 (ref 5–15)
BUN: 80 mg/dL — AB (ref 6–23)
CO2: 23 mmol/L (ref 19–32)
CREATININE: 8.98 mg/dL — AB (ref 0.50–1.35)
Calcium: 9 mg/dL (ref 8.4–10.5)
Chloride: 98 mmol/L (ref 96–112)
GFR calc non Af Amer: 6 mL/min — ABNORMAL LOW (ref >90)
GFR, EST AFRICAN AMERICAN: 7 mL/min — AB (ref >90)
Glucose, Bld: 114 mg/dL — ABNORMAL HIGH (ref 70–99)
POTASSIUM: 5 mmol/L (ref 3.5–5.1)
Sodium: 136 mmol/L (ref 135–145)

## 2014-10-03 DIAGNOSIS — N2581 Secondary hyperparathyroidism of renal origin: Secondary | ICD-10-CM | POA: Diagnosis not present

## 2014-10-03 DIAGNOSIS — D631 Anemia in chronic kidney disease: Secondary | ICD-10-CM | POA: Diagnosis not present

## 2014-10-03 DIAGNOSIS — N186 End stage renal disease: Secondary | ICD-10-CM | POA: Diagnosis not present

## 2014-10-04 LAB — CULTURE, BLOOD (ROUTINE X 2)
CULTURE: NO GROWTH
Culture: NO GROWTH

## 2014-10-04 NOTE — Assessment & Plan Note (Signed)
Checked BMET today. K remains borderline high, will likely correct is HD. Is doing HD T, Th, Sat. Compliant with HD. Continue.

## 2014-10-04 NOTE — Assessment & Plan Note (Signed)
Was hospitalized for acute resp failure with hypercapnia thought to be 2/2 to H1N1 and PCP pneumonia. Patient finished his tamiflu. Has picked up bactrim and prednisone and is taking them still. Symptoms have improved significantly. No SOB currently. Has some dry cough but significantly better than before. Denies any fever, hemoptysis.

## 2014-10-04 NOTE — Progress Notes (Signed)
   Subjective:    Patient ID: Darrell Johnson, male    DOB: [birth year], 49 y.o.   MRN: [id number]  HPI  49 yo male with hx of HIV, Hep C, ESRD newly on HD t, th, sat, HTN, recent hospitalization for acute resp failure with hypercapnia thought to be 2/2 to H1N1 and PCP pneumonia, comes here for hospital follow up.  Patient is doing well. Has some dry cough but no SOB or fever/chills. Is taking bactrim 3 pills daily for 21 days then 3 times weekly for ppx, and prednisone taper as prescribed upon discharge. Left AMA last admission.  Review of Systems  Constitutional: Negative for fever, chills and fatigue.  HENT: Negative for rhinorrhea, sore throat and tinnitus.   Eyes: Negative.   Respiratory: Positive for cough. Negative for chest tightness and shortness of breath.   Cardiovascular: Negative for chest pain and palpitations.  Gastrointestinal: Negative for nausea, vomiting and diarrhea.  Endocrine: Negative.   Genitourinary: Negative for dysuria and urgency.  Musculoskeletal: Negative for back pain and neck pain.  Allergic/Immunologic: Negative.   Neurological: Negative for dizziness, weakness and numbness.  Hematological: Negative.   Psychiatric/Behavioral: Negative.        Objective:   Physical Exam  Constitutional: He is oriented to person, place, and time. He appears well-developed and well-nourished. No distress.  HENT:  Mouth/Throat: Oropharynx is clear and moist.  Eyes: Conjunctivae and EOM are normal. Pupils are equal, round, and reactive to light. Right eye exhibits no discharge. Left eye exhibits no discharge.  Neck: Normal range of motion. No JVD present.  Cardiovascular: Normal rate, regular rhythm and normal heart sounds.  Exam reveals no friction rub.   No murmur heard. Pulmonary/Chest: Effort normal and breath sounds normal. No respiratory distress. He has no wheezes. He has no rales. He exhibits no tenderness.  Abdominal: Soft. Bowel sounds are normal. He exhibits  no distension and no mass.  Musculoskeletal: Normal range of motion.  Mild edema on BLE's.  Neurological: He is alert and oriented to person, place, and time. No cranial nerve deficit. Coordination normal.       Assessment & Plan:  See problem based a&p.

## 2014-10-04 NOTE — Assessment & Plan Note (Signed)
BP remains above goal 155/83. Likely 2/2 to prednisone use. Will recheck next visit. Continue coreg 50mg  BID.

## 2014-10-04 NOTE — Assessment & Plan Note (Signed)
CD4 28, HIV viral RNA <20 1 week ago. On Atripla, is compliant per patient. Per ID inpatient note, wants to change therapy. Has ID follow up in few weeks.   He needs refill on his med and pharmacy was giving him some problem about this. He said he will stop by at the ID clinic to figure out what's wrong and make sure he has his meds.

## 2014-10-05 DIAGNOSIS — D631 Anemia in chronic kidney disease: Secondary | ICD-10-CM | POA: Diagnosis not present

## 2014-10-05 DIAGNOSIS — N2581 Secondary hyperparathyroidism of renal origin: Secondary | ICD-10-CM | POA: Diagnosis not present

## 2014-10-05 DIAGNOSIS — N186 End stage renal disease: Secondary | ICD-10-CM | POA: Diagnosis not present

## 2014-10-05 LAB — HIV-1 INTEGRASE GENOTYPE
Date Viral Load Collected: NO GROWTH
Value last viral load: NO GROWTH

## 2014-10-05 LAB — HIV-1 RNA QUANT-NO REFLEX-BLD: HIV-1 RNA Quant, Log: <1.3 {Log} (ref <1.30)

## 2014-10-05 NOTE — Progress Notes (Signed)
Internal Medicine Clinic Attending Date of Visit: 10/02/2014  Case discussed with Dr. Genene Churn at the time of the visit.  We reviewed the resident's history and exam and pertinent patient test results.  I agree with the assessment, diagnosis, and plan of care documented in the resident's note.

## 2014-10-07 DIAGNOSIS — N2581 Secondary hyperparathyroidism of renal origin: Secondary | ICD-10-CM | POA: Diagnosis not present

## 2014-10-07 DIAGNOSIS — N186 End stage renal disease: Secondary | ICD-10-CM | POA: Diagnosis not present

## 2014-10-07 DIAGNOSIS — D631 Anemia in chronic kidney disease: Secondary | ICD-10-CM | POA: Diagnosis not present

## 2014-10-08 ENCOUNTER — Other Ambulatory Visit: Payer: Self-pay | Admitting: Internal Medicine

## 2014-10-10 DIAGNOSIS — N2581 Secondary hyperparathyroidism of renal origin: Secondary | ICD-10-CM | POA: Diagnosis not present

## 2014-10-10 DIAGNOSIS — N186 End stage renal disease: Secondary | ICD-10-CM | POA: Diagnosis not present

## 2014-10-10 DIAGNOSIS — D631 Anemia in chronic kidney disease: Secondary | ICD-10-CM | POA: Diagnosis not present

## 2014-10-11 DIAGNOSIS — N186 End stage renal disease: Secondary | ICD-10-CM | POA: Diagnosis not present

## 2014-10-11 DIAGNOSIS — N2581 Secondary hyperparathyroidism of renal origin: Secondary | ICD-10-CM | POA: Diagnosis not present

## 2014-10-11 DIAGNOSIS — D631 Anemia in chronic kidney disease: Secondary | ICD-10-CM | POA: Diagnosis not present

## 2014-10-12 DIAGNOSIS — D631 Anemia in chronic kidney disease: Secondary | ICD-10-CM | POA: Diagnosis not present

## 2014-10-12 DIAGNOSIS — N2581 Secondary hyperparathyroidism of renal origin: Secondary | ICD-10-CM | POA: Diagnosis not present

## 2014-10-12 DIAGNOSIS — N186 End stage renal disease: Secondary | ICD-10-CM | POA: Diagnosis not present

## 2014-10-14 DIAGNOSIS — N2581 Secondary hyperparathyroidism of renal origin: Secondary | ICD-10-CM | POA: Diagnosis not present

## 2014-10-14 DIAGNOSIS — N186 End stage renal disease: Secondary | ICD-10-CM | POA: Diagnosis not present

## 2014-10-14 DIAGNOSIS — D631 Anemia in chronic kidney disease: Secondary | ICD-10-CM | POA: Diagnosis not present

## 2014-10-17 DIAGNOSIS — N2581 Secondary hyperparathyroidism of renal origin: Secondary | ICD-10-CM | POA: Diagnosis not present

## 2014-10-17 DIAGNOSIS — D631 Anemia in chronic kidney disease: Secondary | ICD-10-CM | POA: Diagnosis not present

## 2014-10-17 DIAGNOSIS — N186 End stage renal disease: Secondary | ICD-10-CM | POA: Diagnosis not present

## 2014-10-19 DIAGNOSIS — N2581 Secondary hyperparathyroidism of renal origin: Secondary | ICD-10-CM | POA: Diagnosis not present

## 2014-10-19 DIAGNOSIS — N186 End stage renal disease: Secondary | ICD-10-CM | POA: Diagnosis not present

## 2014-10-19 DIAGNOSIS — Z992 Dependence on renal dialysis: Secondary | ICD-10-CM | POA: Diagnosis not present

## 2014-10-19 DIAGNOSIS — D631 Anemia in chronic kidney disease: Secondary | ICD-10-CM | POA: Diagnosis not present

## 2014-10-19 DIAGNOSIS — I120 Hypertensive chronic kidney disease with stage 5 chronic kidney disease or end stage renal disease: Secondary | ICD-10-CM | POA: Diagnosis not present

## 2014-10-21 DIAGNOSIS — N186 End stage renal disease: Secondary | ICD-10-CM | POA: Diagnosis not present

## 2014-10-21 DIAGNOSIS — D631 Anemia in chronic kidney disease: Secondary | ICD-10-CM | POA: Diagnosis not present

## 2014-10-21 DIAGNOSIS — N2581 Secondary hyperparathyroidism of renal origin: Secondary | ICD-10-CM | POA: Diagnosis not present

## 2014-10-21 DIAGNOSIS — E8779 Other fluid overload: Secondary | ICD-10-CM | POA: Diagnosis not present

## 2014-10-24 DIAGNOSIS — N186 End stage renal disease: Secondary | ICD-10-CM | POA: Diagnosis not present

## 2014-10-24 DIAGNOSIS — D631 Anemia in chronic kidney disease: Secondary | ICD-10-CM | POA: Diagnosis not present

## 2014-10-24 DIAGNOSIS — E8779 Other fluid overload: Secondary | ICD-10-CM | POA: Diagnosis not present

## 2014-10-24 DIAGNOSIS — N2581 Secondary hyperparathyroidism of renal origin: Secondary | ICD-10-CM | POA: Diagnosis not present

## 2014-10-26 ENCOUNTER — Other Ambulatory Visit: Payer: Self-pay | Admitting: Internal Medicine

## 2014-10-26 DIAGNOSIS — N186 End stage renal disease: Secondary | ICD-10-CM | POA: Diagnosis not present

## 2014-10-26 DIAGNOSIS — E8779 Other fluid overload: Secondary | ICD-10-CM | POA: Diagnosis not present

## 2014-10-26 DIAGNOSIS — D631 Anemia in chronic kidney disease: Secondary | ICD-10-CM | POA: Diagnosis not present

## 2014-10-26 DIAGNOSIS — N2581 Secondary hyperparathyroidism of renal origin: Secondary | ICD-10-CM | POA: Diagnosis not present

## 2014-10-26 NOTE — Telephone Encounter (Signed)
Doesn't need prednisone. Was supposed to have 21 days taper.

## 2014-10-28 DIAGNOSIS — N186 End stage renal disease: Secondary | ICD-10-CM | POA: Diagnosis not present

## 2014-10-28 DIAGNOSIS — D631 Anemia in chronic kidney disease: Secondary | ICD-10-CM | POA: Diagnosis not present

## 2014-10-28 DIAGNOSIS — E8779 Other fluid overload: Secondary | ICD-10-CM | POA: Diagnosis not present

## 2014-10-28 DIAGNOSIS — N2581 Secondary hyperparathyroidism of renal origin: Secondary | ICD-10-CM | POA: Diagnosis not present

## 2014-10-30 DIAGNOSIS — M1000 Idiopathic gout, unspecified site: Secondary | ICD-10-CM | POA: Diagnosis not present

## 2014-10-30 DIAGNOSIS — M25562 Pain in left knee: Secondary | ICD-10-CM | POA: Diagnosis not present

## 2014-10-30 DIAGNOSIS — M2550 Pain in unspecified joint: Secondary | ICD-10-CM | POA: Diagnosis not present

## 2014-10-30 DIAGNOSIS — B20 Human immunodeficiency virus [HIV] disease: Secondary | ICD-10-CM | POA: Diagnosis not present

## 2014-10-30 DIAGNOSIS — Z79899 Other long term (current) drug therapy: Secondary | ICD-10-CM | POA: Diagnosis not present

## 2014-10-31 DIAGNOSIS — E8779 Other fluid overload: Secondary | ICD-10-CM | POA: Diagnosis not present

## 2014-10-31 DIAGNOSIS — N2581 Secondary hyperparathyroidism of renal origin: Secondary | ICD-10-CM | POA: Diagnosis not present

## 2014-10-31 DIAGNOSIS — D631 Anemia in chronic kidney disease: Secondary | ICD-10-CM | POA: Diagnosis not present

## 2014-10-31 DIAGNOSIS — N186 End stage renal disease: Secondary | ICD-10-CM | POA: Diagnosis not present

## 2014-11-01 DIAGNOSIS — N186 End stage renal disease: Secondary | ICD-10-CM | POA: Diagnosis not present

## 2014-11-01 DIAGNOSIS — D631 Anemia in chronic kidney disease: Secondary | ICD-10-CM | POA: Diagnosis not present

## 2014-11-01 DIAGNOSIS — E8779 Other fluid overload: Secondary | ICD-10-CM | POA: Diagnosis not present

## 2014-11-01 DIAGNOSIS — N2581 Secondary hyperparathyroidism of renal origin: Secondary | ICD-10-CM | POA: Diagnosis not present

## 2014-11-02 DIAGNOSIS — E8779 Other fluid overload: Secondary | ICD-10-CM | POA: Diagnosis not present

## 2014-11-02 DIAGNOSIS — N2581 Secondary hyperparathyroidism of renal origin: Secondary | ICD-10-CM | POA: Diagnosis not present

## 2014-11-02 DIAGNOSIS — N186 End stage renal disease: Secondary | ICD-10-CM | POA: Diagnosis not present

## 2014-11-02 DIAGNOSIS — D631 Anemia in chronic kidney disease: Secondary | ICD-10-CM | POA: Diagnosis not present

## 2014-11-04 DIAGNOSIS — D631 Anemia in chronic kidney disease: Secondary | ICD-10-CM | POA: Diagnosis not present

## 2014-11-04 DIAGNOSIS — E8779 Other fluid overload: Secondary | ICD-10-CM | POA: Diagnosis not present

## 2014-11-04 DIAGNOSIS — N2581 Secondary hyperparathyroidism of renal origin: Secondary | ICD-10-CM | POA: Diagnosis not present

## 2014-11-04 DIAGNOSIS — N186 End stage renal disease: Secondary | ICD-10-CM | POA: Diagnosis not present

## 2014-11-06 ENCOUNTER — Other Ambulatory Visit: Payer: Medicare Other

## 2014-11-06 DIAGNOSIS — B20 Human immunodeficiency virus [HIV] disease: Secondary | ICD-10-CM

## 2014-11-07 DIAGNOSIS — N186 End stage renal disease: Secondary | ICD-10-CM | POA: Diagnosis not present

## 2014-11-07 DIAGNOSIS — E8779 Other fluid overload: Secondary | ICD-10-CM | POA: Diagnosis not present

## 2014-11-07 DIAGNOSIS — D631 Anemia in chronic kidney disease: Secondary | ICD-10-CM | POA: Diagnosis not present

## 2014-11-07 DIAGNOSIS — N2581 Secondary hyperparathyroidism of renal origin: Secondary | ICD-10-CM | POA: Diagnosis not present

## 2014-11-09 DIAGNOSIS — N2581 Secondary hyperparathyroidism of renal origin: Secondary | ICD-10-CM | POA: Diagnosis not present

## 2014-11-09 DIAGNOSIS — E8779 Other fluid overload: Secondary | ICD-10-CM | POA: Diagnosis not present

## 2014-11-09 DIAGNOSIS — N186 End stage renal disease: Secondary | ICD-10-CM | POA: Diagnosis not present

## 2014-11-09 DIAGNOSIS — D631 Anemia in chronic kidney disease: Secondary | ICD-10-CM | POA: Diagnosis not present

## 2014-11-09 LAB — HIV-1 RNA QUANT-NO REFLEX-BLD: HIV-1 RNA Quant, Log: <1.3 {Log} (ref <1.30)

## 2014-11-11 DIAGNOSIS — E8779 Other fluid overload: Secondary | ICD-10-CM | POA: Diagnosis not present

## 2014-11-11 DIAGNOSIS — N2581 Secondary hyperparathyroidism of renal origin: Secondary | ICD-10-CM | POA: Diagnosis not present

## 2014-11-11 DIAGNOSIS — N186 End stage renal disease: Secondary | ICD-10-CM | POA: Diagnosis not present

## 2014-11-11 DIAGNOSIS — D631 Anemia in chronic kidney disease: Secondary | ICD-10-CM | POA: Diagnosis not present

## 2014-11-13 DIAGNOSIS — N186 End stage renal disease: Secondary | ICD-10-CM | POA: Diagnosis not present

## 2014-11-13 DIAGNOSIS — E8779 Other fluid overload: Secondary | ICD-10-CM | POA: Diagnosis not present

## 2014-11-13 DIAGNOSIS — N2581 Secondary hyperparathyroidism of renal origin: Secondary | ICD-10-CM | POA: Diagnosis not present

## 2014-11-13 DIAGNOSIS — D631 Anemia in chronic kidney disease: Secondary | ICD-10-CM | POA: Diagnosis not present

## 2014-11-14 DIAGNOSIS — N186 End stage renal disease: Secondary | ICD-10-CM | POA: Diagnosis not present

## 2014-11-14 DIAGNOSIS — D631 Anemia in chronic kidney disease: Secondary | ICD-10-CM | POA: Diagnosis not present

## 2014-11-14 DIAGNOSIS — N2581 Secondary hyperparathyroidism of renal origin: Secondary | ICD-10-CM | POA: Diagnosis not present

## 2014-11-14 DIAGNOSIS — E8779 Other fluid overload: Secondary | ICD-10-CM | POA: Diagnosis not present

## 2014-11-16 ENCOUNTER — Encounter: Payer: Self-pay | Admitting: Internal Medicine

## 2014-11-16 ENCOUNTER — Ambulatory Visit (INDEPENDENT_AMBULATORY_CARE_PROVIDER_SITE_OTHER): Payer: Medicare Other | Admitting: Internal Medicine

## 2014-11-16 VITALS — BP 157/79 | HR 97 | Temp 98.7°F | Ht 70.5 in | Wt 229.4 lb

## 2014-11-16 DIAGNOSIS — N186 End stage renal disease: Secondary | ICD-10-CM | POA: Diagnosis not present

## 2014-11-16 DIAGNOSIS — E8779 Other fluid overload: Secondary | ICD-10-CM | POA: Diagnosis not present

## 2014-11-16 DIAGNOSIS — M1A00X0 Idiopathic chronic gout, unspecified site, without tophus (tophi): Secondary | ICD-10-CM

## 2014-11-16 DIAGNOSIS — F17200 Nicotine dependence, unspecified, uncomplicated: Secondary | ICD-10-CM | POA: Diagnosis not present

## 2014-11-16 DIAGNOSIS — G894 Chronic pain syndrome: Secondary | ICD-10-CM | POA: Diagnosis not present

## 2014-11-16 DIAGNOSIS — N2581 Secondary hyperparathyroidism of renal origin: Secondary | ICD-10-CM | POA: Diagnosis not present

## 2014-11-16 DIAGNOSIS — D631 Anemia in chronic kidney disease: Secondary | ICD-10-CM | POA: Diagnosis not present

## 2014-11-16 LAB — COMPLETE METABOLIC PANEL WITH GFR
ALK PHOS: 69 U/L (ref 39–117)
ALT: 23 U/L (ref 0–53)
AST: 18 U/L (ref 0–37)
Albumin: 4.3 g/dL (ref 3.5–5.2)
BUN: 24 mg/dL — ABNORMAL HIGH (ref 6–23)
CALCIUM: 9.1 mg/dL (ref 8.4–10.5)
CO2: 27 mEq/L (ref 19–32)
Chloride: 99 mEq/L (ref 96–112)
Creat: 5.07 mg/dL — ABNORMAL HIGH (ref 0.50–1.35)
GFR, Est African American: 14 mL/min — ABNORMAL LOW
GFR, Est Non African American: 12 mL/min — ABNORMAL LOW
Glucose, Bld: 92 mg/dL (ref 70–99)
POTASSIUM: 3.8 meq/L (ref 3.5–5.3)
Sodium: 137 mEq/L (ref 135–145)
TOTAL PROTEIN: 6.7 g/dL (ref 6.0–8.3)
Total Bilirubin: 0.4 mg/dL (ref 0.2–1.2)

## 2014-11-16 LAB — CK: Total CK: 88 U/L (ref 7–232)

## 2014-11-16 LAB — MAGNESIUM: Magnesium: 2.1 mg/dL (ref 1.5–2.5)

## 2014-11-16 LAB — HLA B*5701: HLA-B*5701 w/rflx HLA-B High: NEGATIVE

## 2014-11-16 LAB — LACTATE DEHYDROGENASE: LDH: 329 U/L — ABNORMAL HIGH (ref 94–250)

## 2014-11-16 MED ORDER — HYDROCODONE-ACETAMINOPHEN 10-325 MG PO TABS: 1.0000 | ORAL_TABLET | Freq: Three times a day (TID) | ORAL | Status: DC | PRN

## 2014-11-16 NOTE — Progress Notes (Signed)
Patient ID: Darrell Johnson, male   DOB: 1965-09-07, 49 y.o.   MRN: [id number]   Subjective:   HPI: Darrell Johnson is a 49 y.o. gentleman with past medical history of chronic pain syndrome, gout, among other medical problems as listed below presents for an acute visit due to left lower extremity pain.  Reason(s) for visit:  Left lower extremity pain: Darrell Johnson complains of left lower extremity pain which has been ongoing for the past 5 days. Patient describes his pain as severe, achy and sharp, 10/10, non-radiating, occurring both at rest and with ambulation. He took 1 pill of Norco 10 mg today with some relief. Sometimes the pain wakes him up in the middle the night and he feels like he can't straighten up the leg. Patient says that previous workup including Dopplers of his legs have been negative for blood clot. He gets recurrence of this similar pain in the same leg 2-3 times a month and it tends to improve after a few days. He has intermittently been on opiates as treatment for chronic pain related to gout. He mentions that some of the pain in the leg has previously responded well to steroid injections. Patient follows up with Dr. Trudie Reed, of rheumatology who has the patient on Prednisone 5 mg daily and Urolic. He had a left knee x-ray more than a year ago which revealed mild degenerative changes no acute abnormalities.   He denies fevers, trauma, chills increased fatigue.    He just had his dialysis session today before he came to the clinic.    Past Medical History  Diagnosis Date  . 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 Hypertension   . Seizures     last seizure was >5 years ago, pt has family history of seizures  . Syphilis 1997    history of syphilis 1997  . Chronic kidney disease     stage 3-4 CKD, followed by Dr. Moshe Cipro  . 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 Rib fractures 01/2009  . HIV infection 1980's    on ART therapy since, followed by ID clinic, complicated  by neuropathy  . 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 Pneumonia   . Arthritis   . Anemia   . HYPERTENSION 05/08/2006  . Gout, unspecified 08/13/2009    Qualifier: Diagnosis of  By: Redmond Pulling  MD, Mateo Flow    . CHF (congestive heart failure)     ROS: Constitutional:  Denies fevers, chills, diaphoresis, appetite change and fatigue.  Respiratory: Denies SOB, DOE, cough, chest tightness, and wheezing.  CVS: No chest pain, palpitations and leg swelling.  GI: No abdominal pain, nausea, vomiting, bloody stools GU: No dysuria, frequency, hematuria, or flank pain.  Psych: No depression symptoms. No SI or SA.    Objective:  Physical Exam: Filed Vitals:   11/16/14 1458  BP: 157/79  Pulse: 97  Temp: 98.7 F (37.1 C)  TempSrc: Oral  Height: 5' 10.5" (1.791 m)  Weight: 229 lb 6.4 oz (104.055 kg)  SpO2: 98%   General: Well nourished. Mild-moderate distress. Has a cane for walking HEENT: Normal oral mucosa. MMM.  Lungs: CTA bilaterally. No wheezing. Heart: RRR; no extra sounds or murmurs  Abdomen: Non-distended, normal bowel sounds, soft, nontender; no hepatosplenomegaly  Extremities: No tenderness on palpation of  his calf and thigh muscles. No palpable mass. The knee exam is normal without signs of inflammation. The distal pulses are +2 and normal bilaterally. Skin on his legs looks normal without hair loss.  No pedal edema. No joint swelling or tenderness. Neurologic: Normal EOM,  Alert and oriented x3. No obvious neurologic/cranial nerve deficits.  Assessment & Plan:  Discussed case with Dr Lynnae January. See problem based charting for assessment and plan.

## 2014-11-16 NOTE — Patient Instructions (Signed)
General Instructions: Please take Norco 10 -325 mg every 8 hours as needed for pain  Please call Dr Trudie Reed' office on Monday if the pain does improved  We will check some labs today  Please follow up 1-2 months  Please bring your medicines with you each time you come to clinic.  Medicines may include prescription medications, over-the-counter medications, herbal remedies, eye drops, vitamins, or other pills.   Progress Toward Treatment Goals:  Treatment Goal 11/10/2013  Blood pressure unchanged  Stop smoking smoking the same amount    Self Care Goals & Plans:  Self Care Goal 11/16/2014  Manage my medications take my medicines as prescribed; bring my medications to every visit; refill my medications on time; follow the sick day instructions if I am sick  Monitor my health keep track of my blood pressure  Eat healthy foods eat more vegetables; eat fruit for snacks and desserts; eat foods that are low in salt; eat baked foods instead of fried foods; eat smaller portions; drink diet soda or water instead of juice or soda  Be physically active find an activity I enjoy  Stop smoking -    No flowsheet data found.   Care Management & Community Referrals:  Referral 06/15/2013  Referrals made for care management support none needed

## 2014-11-17 NOTE — Assessment & Plan Note (Addendum)
Assessment: The etiology of his lower extremity pain is unclear at this point. Differentials include myopathy, electrolyte abnormalities like hypokalemia or hypomagnesemia but these have come back normal. CK is not elevated. Ischemic claudication is also considered. However, the patient has pain both at rest and on ambulation. Given his chronic renal disease, atherosclerosis, with resultant claudication remains a possibility. it appears he has never had ABIs. Abscess formation his low  on the differential at this point without fevers or chills and without any tenderness on palpation but this too will require further investigation if suspicion remains after all other workup is negative. I do not think that his pain is related to gout given that his joint exam is normal. However, patient states that similar pains in the past have been relieved by treatment he has received for gout, which does not make a lot of sense to me. I do not suspect DVT. In 03/2014, patient had swelling of the same leg and lower extremity Doppler did not reveal deep venous thrombosis.   Plan: 1. Labs/imaging: CMP>> normal, Mg is normal at 2.1, CK not elevated and normal LDH. I will proceed and order ABI for him to rule out PVD. 2. Therapy: Provided him with 30 pills of Norco 10 -325 to take tid prn. Encouraged him to call Dr Trudie Reed office on Monday if his pain does not improve.  3. Follow up: 1 month or as needed

## 2014-11-17 NOTE — Progress Notes (Signed)
Internal Medicine Clinic Attending  Case discussed with Dr. Kazibwe soon after the resident saw the patient.  We reviewed the resident's history and exam and pertinent patient test results.  I agree with the assessment, diagnosis, and plan of care documented in the resident's note. 

## 2014-11-17 NOTE — Assessment & Plan Note (Signed)
No evidence of gout flare in this patient. However, I have encouraged him to call Dr Trudie Reed' office to be evaluated since previous treatment for gout have helped his pain in the leg.

## 2014-11-18 DIAGNOSIS — I120 Hypertensive chronic kidney disease with stage 5 chronic kidney disease or end stage renal disease: Secondary | ICD-10-CM | POA: Diagnosis not present

## 2014-11-18 DIAGNOSIS — Z992 Dependence on renal dialysis: Secondary | ICD-10-CM | POA: Diagnosis not present

## 2014-11-18 DIAGNOSIS — N2581 Secondary hyperparathyroidism of renal origin: Secondary | ICD-10-CM | POA: Diagnosis not present

## 2014-11-18 DIAGNOSIS — N186 End stage renal disease: Secondary | ICD-10-CM | POA: Diagnosis not present

## 2014-11-18 DIAGNOSIS — D631 Anemia in chronic kidney disease: Secondary | ICD-10-CM | POA: Diagnosis not present

## 2014-11-18 DIAGNOSIS — E8779 Other fluid overload: Secondary | ICD-10-CM | POA: Diagnosis not present

## 2014-11-20 ENCOUNTER — Ambulatory Visit: Payer: Medicare Other | Admitting: Internal Medicine

## 2014-11-20 NOTE — Progress Notes (Signed)
Pt aware of ABI sch at [facility] 11/21/14 1PM. Nobel Brar RN 11/20/14 9:15AM

## 2014-11-21 ENCOUNTER — Ambulatory Visit (HOSPITAL_COMMUNITY): Admission: RE | Admit: 2014-11-21 | Payer: Medicare Other | Source: Ambulatory Visit

## 2014-11-21 ENCOUNTER — Other Ambulatory Visit: Payer: Self-pay | Admitting: Internal Medicine

## 2014-11-21 ENCOUNTER — Ambulatory Visit (HOSPITAL_BASED_OUTPATIENT_CLINIC_OR_DEPARTMENT_OTHER)
Admission: RE | Admit: 2014-11-21 | Discharge: 2014-11-21 | Disposition: A | Discharge status: Home / Self Care | Payer: Medicare Other | Source: Ambulatory Visit | Attending: Internal Medicine | Admitting: Internal Medicine

## 2014-11-21 ENCOUNTER — Ambulatory Visit (HOSPITAL_COMMUNITY)
Admission: RE | Admit: 2014-11-21 | Discharge: 2014-11-21 | Disposition: A | Discharge status: Home / Self Care | Payer: Medicare Other | Source: Ambulatory Visit | Attending: Internal Medicine | Admitting: Internal Medicine

## 2014-11-21 DIAGNOSIS — G894 Chronic pain syndrome: Secondary | ICD-10-CM

## 2014-11-21 DIAGNOSIS — M79609 Pain in unspecified limb: Secondary | ICD-10-CM | POA: Diagnosis not present

## 2014-11-21 DIAGNOSIS — M79604 Pain in right leg: Secondary | ICD-10-CM | POA: Diagnosis not present

## 2014-11-21 DIAGNOSIS — N186 End stage renal disease: Secondary | ICD-10-CM | POA: Diagnosis not present

## 2014-11-21 DIAGNOSIS — D631 Anemia in chronic kidney disease: Secondary | ICD-10-CM | POA: Diagnosis not present

## 2014-11-21 DIAGNOSIS — R52 Pain, unspecified: Secondary | ICD-10-CM

## 2014-11-21 DIAGNOSIS — N2581 Secondary hyperparathyroidism of renal origin: Secondary | ICD-10-CM | POA: Diagnosis not present

## 2014-11-21 DIAGNOSIS — M79605 Pain in left leg: Secondary | ICD-10-CM | POA: Diagnosis not present

## 2014-11-21 DIAGNOSIS — Z21 Asymptomatic human immunodeficiency virus [HIV] infection status: Secondary | ICD-10-CM | POA: Insufficient documentation

## 2014-11-22 ENCOUNTER — Ambulatory Visit (INDEPENDENT_AMBULATORY_CARE_PROVIDER_SITE_OTHER): Payer: Medicare Other | Admitting: Internal Medicine

## 2014-11-22 ENCOUNTER — Encounter: Payer: Self-pay | Admitting: Internal Medicine

## 2014-11-22 VITALS — BP 184/92 | HR 83 | Temp 98.5°F | Ht 71.0 in | Wt 234.0 lb

## 2014-11-22 DIAGNOSIS — N186 End stage renal disease: Secondary | ICD-10-CM | POA: Diagnosis not present

## 2014-11-22 DIAGNOSIS — Z992 Dependence on renal dialysis: Secondary | ICD-10-CM

## 2014-11-22 DIAGNOSIS — D72819 Decreased white blood cell count, unspecified: Secondary | ICD-10-CM | POA: Diagnosis not present

## 2014-11-22 DIAGNOSIS — B20 Human immunodeficiency virus [HIV] disease: Secondary | ICD-10-CM | POA: Diagnosis present

## 2014-11-22 DIAGNOSIS — I10 Essential (primary) hypertension: Secondary | ICD-10-CM

## 2014-11-22 NOTE — Progress Notes (Signed)
Patient ID: Darrell Johnson, male   DOB: 1966-06-03, 49 y.o.   MRN: [id number]       Patient ID: Darrell Johnson, male   DOB: 04-05-1966, 49 y.o.   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 CD 4 count of 70/VL<20 in March 2016. We last saw him in clinic in January, then transition to HD, which he tolerates roughly 3 hrs, until onset of cramping in hands towards end of HD sessions. He was hospitalized in early March for Influenza, and emperic treatment of PCP PNA due ot elevated LDH and low CD 4 count (ffrom 230 down to 70). He has finished steroid and bactrim course of therapy. He recently reports having being treated for another gouty flare of arthritis. He is doing better now. He has upcoming transplant ID visit at [facility] next week.  Outpatient Encounter Prescriptions as of 11/22/2014  Medication Sig  . calcitRIOL (ROCALTROL) 0.5 MCG capsule Take 0.5 mcg by mouth daily. Given at dialysis  . carvedilol (COREG) 25 MG tablet Take 2 tablets (50 mg total) by mouth 2 (two) times daily with a meal.  . Darunavir Ethanolate (PREZISTA) 800 MG tablet Take 1 tablet (800 mg total) by mouth daily with breakfast.  . doxazosin (CARDURA) 8 MG tablet Take 8 mg by mouth at bedtime.  . Febuxostat (ULORIC) 80 MG TABS Take 80 mg by mouth daily.  . furosemide (LASIX) 40 MG tablet   . HYDROcodone-acetaminophen (NORCO) 10-325 MG per tablet Take 1 tablet by mouth every 8 (eight) hours as needed.  . lamivudine (EPIVIR) 100 MG tablet Take 0.5 tablets (50 mg total) by mouth daily. Due to renal function.  . montelukast (SINGULAIR) 10 MG tablet TAKE 1 TABLET (10 MG TOTAL) BY MOUTH AT BEDTIME.  . predniSONE (STERAPRED UNI-PAK 48 TAB) 10 MG (48) TBPK tablet   . raltegravir (ISENTRESS) 400 MG tablet Take 1 tablet (400 mg total) by mouth 2 (two) times daily.  Marland Kitchen RENVELA 800 MG tablet   . ritonavir (NORVIR) 100 MG TABS tablet Take 1 tablet (100 mg total) by mouth daily.  . sodium bicarbonate  650 MG tablet Take 650 mg by mouth 2 (two) times daily.  Marland Kitchen sulfamethoxazole-trimethoprim (BACTRIM DS,SEPTRA DS) 800-160 MG per tablet Take 3 tablets by mouth daily. 3 pills daily for 21 days, then 3 times weekly (Patient not taking: Reported on 11/22/2014)  . zidovudine (RETROVIR) 300 MG tablet Take 1 tablet (300 mg total) by mouth daily.  . [DISCONTINUED] montelukast (SINGULAIR) 10 MG tablet Take 10 mg by mouth 2 (two) times daily.  . [DISCONTINUED] oseltamivir (TAMIFLU) 30 MG capsule Take 1 capsule (30 mg total) by mouth every other day. Take after dialysis on HD days  . [DISCONTINUED] predniSONE (STERAPRED UNI-PAK 48 TAB) 5 MG (48) TBPK tablet Take 5 mg by mouth daily.   No facility-administered encounter medications on file as of 11/22/2014.     Patient Active Problem List   Diagnosis Date Noted  . Influenza A (H1N1) 09/29/2014  . PCP (pneumocystis carinii pneumonia)   . AIDS   . Leukopenia   . Pulmonary edema   . Acute respiratory failure with hypercapnia 09/26/2014  . Anemia in chronic kidney disease 08/30/2014  . Tooth pain 07/04/2014  . End stage renal disease 12/27/2013  . Drug noncompliance 11/07/2013  . History of syphilis 11/07/2013  . Arthritis 09/09/2013  . Tobacco abuse 09/07/2013  . Chronic pain disorder 04/28/2013  . HYPERTRIGLYCERIDEMIA  11/01/2009  . Gout 08/13/2009  . ERECTILE DYSFUNCTION 06/08/2008  . Human immunodeficiency virus (HIV) disease 05/08/2006  . PERIPHERAL NEUROPATHY 05/08/2006  . Essential hypertension 05/08/2006  . SEIZURE DISORDER 05/08/2006     There are no preventive care reminders to display for this patient.   Review of Systems Review of Systems  Constitutional: Negative for fever, chills, diaphoresis, activity change, appetite change, fatigue and unexpected weight change.  HENT: Negative for congestion, sore throat, rhinorrhea, sneezing, trouble swallowing and sinus pressure.  Eyes: Negative for photophobia and visual disturbance.    Respiratory: Negative for cough, chest tightness, shortness of breath, wheezing and stridor.  Cardiovascular: Negative for chest pain, palpitations and leg swelling.  Gastrointestinal: Negative for nausea, vomiting, abdominal pain, diarrhea, constipation, blood in stool, abdominal distention and anal bleeding.  Genitourinary: Negative for dysuria, hematuria, flank pain and difficulty urinating.  Musculoskeletal: Negative for myalgias, back pain, joint swelling, arthralgias and gait problem.  Skin: Negative for color change, pallor, rash and wound.  Neurological: Negative for dizziness, tremors, weakness and light-headedness.  Hematological: Negative for adenopathy. Does not bruise/bleed easily.  Psychiatric/Behavioral: Negative for behavioral problems, confusion, sleep disturbance, dysphoric mood, decreased concentration and agitation.    Physical Exam   BP 184/92 mmHg  Pulse 83  Temp(Src) 98.5 F (36.9 C) (Oral)  Ht 5\' 11"  (1.803 m)  Wt 234 lb (106.142 kg)  BMI 32.65 kg/m2 Physical Exam  Constitutional: He is oriented to person, place, and time. He appears well-developed and well-nourished. No distress.  HENT:  Mouth/Throat: Oropharynx is clear and moist. No oropharyngeal exudate.  Cardiovascular: Normal rate, regular rhythm and normal heart sounds. Exam reveals no gallop and no friction rub.  No murmur heard.  Pulmonary/Chest: Effort normal and breath sounds normal. No respiratory distress. He has no wheezes.  Abdominal: Soft. Bowel sounds are normal. He exhibits no distension. There is no tenderness.  Lymphadenopathy:  He has no cervical adenopathy.  Neurological: He is alert and oriented to person, place, and time.  Ext: +thrill right fore arm Skin: Skin is warm and dry. No rash noted. Psychiatric: He has a normal mood and affect. His behavior is normal.    Lab Results  Component Value Date   CD4TCELL 28* 08/17/2014   Lab Results  Component Value Date   CD4TABS 230*  08/17/2014   CD4TABS 440 05/11/2014   CD4TABS 490 10/20/2013   Lab Results  Component Value Date   HIV1RNAQUANT <20 11/06/2014   Lab Results  Component Value Date   HEPBSAB POS* 08/17/2014   No results found for: RPR  CBC Lab Results  Component Value Date   WBC 2.6* 09/30/2014   RBC 2.38* 09/30/2014   HGB 8.5* 09/30/2014   HCT 26.1* 09/30/2014   PLT 105* 09/30/2014   MCV 109.7* 09/30/2014   MCH 35.7* 09/30/2014   MCHC 32.6 09/30/2014   RDW 15.6* 09/30/2014   LYMPHSABS 0.3* 09/28/2014   MONOABS 0.2 09/28/2014   EOSABS 0.0 09/28/2014   BASOSABS 0.0 09/28/2014   BMET Lab Results  Component Value Date   NA 137 11/16/2014   K 3.8 11/16/2014   CL 99 11/16/2014   CO2 27 11/16/2014   GLUCOSE 92 11/16/2014   BUN 24* 11/16/2014   CREATININE 5.07* 11/16/2014   CALCIUM 9.1 11/16/2014   GFRNONAA 12* 11/16/2014   GFRAA 14* 11/16/2014     Assessment and Plan   hiv disease = recent labs revealed leukopenia and low cd 4 count in setting of acute illness. We  will recheck CD 4 count. He remains undetectable. Unable to find old geno records to help simplify his regimen. Will have him continue with RLG bid, boosted darunavir plus 3TC and AZT  Gouty flare = appears resolved. No need for steroid taper  Hx of influenza = he did have flu shot but had flu episode in March 2016. Appears completely recovered  ESRD on HD = continue on T-Th-Saturday as well as followup with [name] team  HTN = will take meds when he gets home, asymptomatic

## 2014-11-23 DIAGNOSIS — N186 End stage renal disease: Secondary | ICD-10-CM | POA: Diagnosis not present

## 2014-11-23 DIAGNOSIS — N2581 Secondary hyperparathyroidism of renal origin: Secondary | ICD-10-CM | POA: Diagnosis not present

## 2014-11-23 DIAGNOSIS — D631 Anemia in chronic kidney disease: Secondary | ICD-10-CM | POA: Diagnosis not present

## 2014-11-23 LAB — T-HELPER CELL (CD4) - (RCID CLINIC ONLY)
CD4 % Helper T Cell: 23 % — ABNORMAL LOW (ref 33–55)
CD4 T CELL ABS: 270 /uL — AB (ref 400–2700)

## 2014-11-25 ENCOUNTER — Encounter (HOSPITAL_COMMUNITY): Payer: Self-pay | Admitting: Emergency Medicine

## 2014-11-25 ENCOUNTER — Emergency Department (HOSPITAL_COMMUNITY)
Admission: EM | Admit: 2014-11-25 | Discharge: 2014-11-25 | Disposition: A | Discharge status: Home / Self Care | Payer: Medicare Other | Attending: Emergency Medicine | Admitting: Emergency Medicine

## 2014-11-25 DIAGNOSIS — Z862 Personal history of diseases of the blood and blood-forming organs and certain disorders involving the immune mechanism: Secondary | ICD-10-CM | POA: Insufficient documentation

## 2014-11-25 DIAGNOSIS — Z720 Tobacco use: Secondary | ICD-10-CM | POA: Insufficient documentation

## 2014-11-25 DIAGNOSIS — K644 Residual hemorrhoidal skin tags: Secondary | ICD-10-CM | POA: Diagnosis not present

## 2014-11-25 DIAGNOSIS — K6289 Other specified diseases of anus and rectum: Secondary | ICD-10-CM | POA: Diagnosis present

## 2014-11-25 DIAGNOSIS — Z8781 Personal history of (healed) traumatic fracture: Secondary | ICD-10-CM | POA: Insufficient documentation

## 2014-11-25 DIAGNOSIS — Z8701 Personal history of pneumonia (recurrent): Secondary | ICD-10-CM | POA: Insufficient documentation

## 2014-11-25 DIAGNOSIS — M1990 Unspecified osteoarthritis, unspecified site: Secondary | ICD-10-CM | POA: Insufficient documentation

## 2014-11-25 DIAGNOSIS — Z79899 Other long term (current) drug therapy: Secondary | ICD-10-CM | POA: Insufficient documentation

## 2014-11-25 DIAGNOSIS — R030 Elevated blood-pressure reading, without diagnosis of hypertension: Secondary | ICD-10-CM | POA: Diagnosis not present

## 2014-11-25 DIAGNOSIS — I129 Hypertensive chronic kidney disease with stage 1 through stage 4 chronic kidney disease, or unspecified chronic kidney disease: Secondary | ICD-10-CM | POA: Diagnosis not present

## 2014-11-25 DIAGNOSIS — Z8639 Personal history of other endocrine, nutritional and metabolic disease: Secondary | ICD-10-CM | POA: Insufficient documentation

## 2014-11-25 DIAGNOSIS — I509 Heart failure, unspecified: Secondary | ICD-10-CM | POA: Insufficient documentation

## 2014-11-25 DIAGNOSIS — Z8619 Personal history of other infectious and parasitic diseases: Secondary | ICD-10-CM | POA: Diagnosis not present

## 2014-11-25 DIAGNOSIS — N183 Chronic kidney disease, stage 3 (moderate): Secondary | ICD-10-CM | POA: Diagnosis not present

## 2014-11-25 DIAGNOSIS — I10 Essential (primary) hypertension: Secondary | ICD-10-CM | POA: Diagnosis not present

## 2014-11-25 DIAGNOSIS — M109 Gout, unspecified: Secondary | ICD-10-CM | POA: Insufficient documentation

## 2014-11-25 DIAGNOSIS — Z21 Asymptomatic human immunodeficiency virus [HIV] infection status: Secondary | ICD-10-CM | POA: Insufficient documentation

## 2014-11-25 DIAGNOSIS — IMO0001 Reserved for inherently not codable concepts without codable children: Secondary | ICD-10-CM

## 2014-11-25 MED ORDER — HYDROCORTISONE 2.5 % RE CREA: TOPICAL_CREAM | RECTAL | Status: DC

## 2014-11-25 NOTE — ED Notes (Signed)
Pt. reports abscess at right inner buttocks onset this week with no drainage . Denies fever or chills.

## 2014-11-25 NOTE — ED Notes (Signed)
PA updated on elevated blood pressure , advised RN that pt. can be discharge.

## 2014-11-25 NOTE — ED Provider Notes (Signed)
CSN: [id number]     Arrival date & time 11/25/14  0340 History   First MD Initiated Contact with Patient 11/25/14 [phone]     Chief Complaint  Patient presents with  . Abscess     (Consider location/radiation/quality/duration/timing/severity/associated sxs/prior Treatment) HPI Comments: Patient presents today with rectal pain.  He reports that he noticed a mass of his rectal area 2 days ago and began having some pain.  He reports that the mass has become larger.  He has not noticed any drainage from the area.  He states that he has been able to have bowel movements, but has had some pain with BM.  His last BM was yesterday.  He denies fever, chills, nausea, or vomiting.  He has a history of HIV.    Patient is a 49 y.o. male presenting with abscess. The history is provided by the patient.  Abscess   Past Medical History  Diagnosis Date  . 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 Hypertension   . Seizures     last seizure was >5 years ago, pt has family history of seizures  . Syphilis 1997    history of syphilis 1997  . Chronic kidney disease     stage 3-4 CKD, followed by Dr. Moshe Cipro  . 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 Rib fractures 01/2009  . HIV infection 1980's    on ART therapy since, followed by ID clinic, complicated  by neuropathy  . 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 Pneumonia   . Arthritis   . Anemia   . HYPERTENSION 05/08/2006  . Gout, unspecified 08/13/2009    Qualifier: Diagnosis of  By: Redmond Pulling  MD, Mateo Flow    . CHF (congestive heart failure)    Past Surgical History  Procedure Laterality Date  . Abscess drainage    . Av fistula placement Right 12/02/2013    Procedure: ARTERIOVENOUS (AV) FISTULA CREATION;  Surgeon: Rosetta Posner, MD;  Location: [facility] OR;  Service: Vascular;   Laterality: Right;   Family History  Problem Relation Age of Onset  . Cancer Mother   . COPD Father   . Diabetes Sister   . Hypertension Sister   . Diabetes Brother   . Hypertension Brother   . Stroke Neg Hx    History  Substance Use Topics  . Smoking status: Current Every Day Smoker -- 0.25 packs/day for 20 years    Types: Cigarettes    Last Attempt to Quit: 09/26/2014  . Smokeless tobacco: Never Used     Comment: decreasing.  . Alcohol Use: No    Review of Systems  All other systems reviewed and are negative.     Allergies  Review of patient's allergies indicates no known allergies.  Home Medications   Prior to Admission medications   Medication Sig Start Date End Date Taking? Authorizing Provider  calcitRIOL (ROCALTROL) 0.5 MCG capsule Take 0.5 mcg by mouth daily. Given at dialysis   Yes Historical Provider, MD  carvedilol (COREG) 25 MG tablet Take 2 tablets (50 mg total) by mouth 2 (two) times daily with a meal. 06/07/14  Yes Tasrif Ahmed, MD  Darunavir Ethanolate (PREZISTA) 800 MG tablet Take 1 tablet (800 mg total) by mouth daily with breakfast. 05/09/14  Yes Carlyle Basques, MD  doxazosin (CARDURA) 8  MG tablet Take 8 mg by mouth at bedtime.   Yes Historical Provider, MD  Febuxostat (ULORIC) 80 MG TABS Take 80 mg by mouth daily.   Yes Historical Provider, MD  furosemide (LASIX) 40 MG tablet Take 40 mg by mouth daily.  11/13/14  Yes Historical Provider, MD  HYDROcodone-acetaminophen (NORCO) 10-325 MG per tablet Take 1 tablet by mouth every 8 (eight) hours as needed. 11/16/14  Yes Jessee Avers, MD  lamivudine (EPIVIR) 100 MG tablet Take 0.5 tablets (50 mg total) by mouth daily. Due to renal function. 09/18/14  Yes Carlyle Basques, MD  raltegravir (ISENTRESS) 400 MG tablet Take 1 tablet (400 mg total) by mouth 2 (two) times daily. 05/09/14  Yes Carlyle Basques, MD  RENVELA 800 MG tablet Take 800 mg by mouth 3 (three) times daily with meals.  11/21/14  Yes Historical  Provider, MD  ritonavir (NORVIR) 100 MG TABS tablet Take 1 tablet (100 mg total) by mouth daily. 05/09/14  Yes Carlyle Basques, MD  sodium bicarbonate 650 MG tablet Take 650 mg by mouth 2 (two) times daily. 10/16/14  Yes Historical Provider, MD  zidovudine (RETROVIR) 300 MG tablet Take 1 tablet (300 mg total) by mouth daily. 08/17/14  Yes Carlyle Basques, MD  montelukast (SINGULAIR) 10 MG tablet TAKE 1 TABLET (10 MG TOTAL) BY MOUTH AT BEDTIME. Patient not taking: Reported on 11/25/2014 10/09/14   Carlyle Basques, MD   BP 189/104 mmHg  Temp(Src) 98.4 F (36.9 C) (Oral)  Resp 14  SpO2 99% Physical Exam  Constitutional: He appears well-developed and well-nourished.  HENT:  Head: Normocephalic and atraumatic.  Neck: Normal range of motion. Neck supple.  Cardiovascular: Normal rate, regular rhythm and normal heart sounds.   Pulmonary/Chest: Effort normal and breath sounds normal.  Abdominal: Soft. Bowel sounds are normal. He exhibits no distension and no mass. There is no tenderness. There is no rebound and no guarding.  Genitourinary: Rectal exam shows external hemorrhoid.  3 large external hemorrhoids without thrombosis  Neurological: He is alert.  Skin: Skin is warm and dry.  Psychiatric: He has a normal mood and affect.  Nursing note and vitals reviewed.   ED Course  Procedures (including critical care time) Labs Review Labs Reviewed - No data to display  Imaging Review No results found.   EKG Interpretation None      MDM   Final diagnoses:  None   Patient presents today with rectal pain.  On exam he is found to have three large external hemorrhoids that are non thrombosed.   Patient given Rx for Hydrocortisone rectal cream and also given referral to [facility].  Patient stable for discharge.  Return precautions given.    Hyman Bible, PA-C [id number]  Delora Fuel, MD [id number]  Delora Fuel, MD [id number]

## 2014-11-27 DIAGNOSIS — N186 End stage renal disease: Secondary | ICD-10-CM | POA: Diagnosis not present

## 2014-11-27 DIAGNOSIS — D631 Anemia in chronic kidney disease: Secondary | ICD-10-CM | POA: Diagnosis not present

## 2014-11-27 DIAGNOSIS — N2581 Secondary hyperparathyroidism of renal origin: Secondary | ICD-10-CM | POA: Diagnosis not present

## 2014-11-28 DIAGNOSIS — N2581 Secondary hyperparathyroidism of renal origin: Secondary | ICD-10-CM | POA: Diagnosis not present

## 2014-11-28 DIAGNOSIS — N186 End stage renal disease: Secondary | ICD-10-CM | POA: Diagnosis not present

## 2014-11-28 DIAGNOSIS — D631 Anemia in chronic kidney disease: Secondary | ICD-10-CM | POA: Diagnosis not present

## 2014-11-30 ENCOUNTER — Ambulatory Visit (INDEPENDENT_AMBULATORY_CARE_PROVIDER_SITE_OTHER): Payer: Medicare Other | Admitting: Internal Medicine

## 2014-11-30 ENCOUNTER — Encounter: Payer: Self-pay | Admitting: Internal Medicine

## 2014-11-30 VITALS — BP 191/107 | HR 93 | Temp 98.2°F | Wt 236.2 lb

## 2014-11-30 DIAGNOSIS — I10 Essential (primary) hypertension: Secondary | ICD-10-CM | POA: Diagnosis present

## 2014-11-30 DIAGNOSIS — Z21 Asymptomatic human immunodeficiency virus [HIV] infection status: Secondary | ICD-10-CM

## 2014-11-30 DIAGNOSIS — K644 Residual hemorrhoidal skin tags: Secondary | ICD-10-CM

## 2014-11-30 DIAGNOSIS — G894 Chronic pain syndrome: Secondary | ICD-10-CM

## 2014-11-30 DIAGNOSIS — D631 Anemia in chronic kidney disease: Secondary | ICD-10-CM | POA: Diagnosis not present

## 2014-11-30 DIAGNOSIS — N2581 Secondary hyperparathyroidism of renal origin: Secondary | ICD-10-CM | POA: Diagnosis not present

## 2014-11-30 DIAGNOSIS — K649 Unspecified hemorrhoids: Secondary | ICD-10-CM

## 2014-11-30 DIAGNOSIS — N186 End stage renal disease: Secondary | ICD-10-CM | POA: Diagnosis not present

## 2014-11-30 MED ORDER — CARVEDILOL 25 MG PO TABS: 25.0000 mg | ORAL_TABLET | Freq: Two times a day (BID) | ORAL | Status: DC

## 2014-11-30 MED ORDER — HYDROCODONE-ACETAMINOPHEN 10-325 MG PO TABS: 1.0000 | ORAL_TABLET | Freq: Three times a day (TID) | ORAL | Status: DC | PRN

## 2014-11-30 MED ORDER — HYDROCORTISONE 2.5 % RE CREA: TOPICAL_CREAM | RECTAL | Status: DC

## 2014-11-30 NOTE — Assessment & Plan Note (Signed)
Has 3 non thrombosed ~2 cm each external hemorrhoids that are significantly painful. I did not visualized any fissures or condyloma or any other lesions.  He is HIV patient.   - Given the size of the hemorrhoid with significant pain, I referred him to general surgery.  - gave him script for anusol cream - gave him Norco 10-325mg  q8hr prn #30 tabs - asked to apply ice to the area and also [employer].

## 2014-11-30 NOTE — Progress Notes (Signed)
Case discussed with Dr. Ahmed at the time of the visit.  We reviewed the resident's history and exam and pertinent patient test results.  I agree with the assessment, diagnosis and plan of care documented in the resident's note. 

## 2014-11-30 NOTE — Patient Instructions (Signed)
You should keep using the annusol cream. If you run out, you can also get it over the counter.  You can buy Sitz bath from pharmacy. You dont need a bathtub.  Referred you to surgery for hemorrhoids.  Please take your coreg 25mg  twice a day, every day.  Come back and see me in 2 weeks.

## 2014-11-30 NOTE — Assessment & Plan Note (Addendum)
Filed Vitals:   11/30/14 1425  BP: 191/107  Pulse: 93  Temp: 98.2 F (36.8 C)     BP remains uncontrolled. Systolic elevation may be due to pain but diastolic is also elevated. He stated not being clear about his meds. He takes coreg 50mg  only once a day (he was on 50mg  BID before I inherited him, not sure why). He hasn't taken the med yesterday or this morning.   I explained the danger of uncontrolled BP and told him to take his coreg twice a day. I changed it to coreg 25mg  BID. Also continued his Cardura 8mg  daily.  Asked him to f/up in 2 weeks for his BP.

## 2014-11-30 NOTE — Progress Notes (Signed)
   Subjective:    Patient ID: Darrell Johnson, male    DOB: [birth year], 49 y.o.   MRN: [id number]  HPI  49 yo male with uncontrolled HTN, ESRD on HD T, Th, Sat here with the complain of painful hemorrhoids.  Patient went to the ED recently on 11/25/2014 with the complaint of rectal pain. On their exam they saw 3 external nonthrombosed hemorrhoids. Was given annusol cream and referral to gen surgery. Patient was not sure about the referral but did use the cream. He used the annusol cream and also took hydrocodone 10-325mg  tablet which he had from last clinic visit (was given for leg pain). He stated he got mild relief from these but the hemorrhoids are still very painful. He had rectal bleeding with bowel movements. Has not tried sitz bath as he stated he only has a shower. Has not tried anything else.   His BP was also high in the ED, was thought to be 2/2 to pain. However, his diastolic BP is also high. He stated he was only taking his coreg once a day, and did not take it last night or this morning.     Review of Systems  Constitutional: Negative for fever, chills and fatigue.  HENT: Negative for sore throat and trouble swallowing.   Eyes: Negative for visual disturbance.  Respiratory: Negative for cough, chest tightness, shortness of breath and wheezing.   Cardiovascular: Negative for chest pain, palpitations and leg swelling.  Gastrointestinal: Positive for blood in stool and rectal pain. Negative for nausea, vomiting, abdominal pain, diarrhea and abdominal distention.  Endocrine: Negative.   Genitourinary: Negative.   Musculoskeletal: Negative.        Objective:   Physical Exam  Constitutional: He is oriented to person, place, and time. He appears well-developed and well-nourished.  Appears to be in pain.  HENT:  Head: Normocephalic and atraumatic.  Right Ear: External ear normal.  Mouth/Throat: Oropharynx is clear and moist.  Eyes: Conjunctivae are normal. Pupils are equal,  round, and reactive to light.  Neck: Normal range of motion. Neck supple.  Pulmonary/Chest: Effort normal and breath sounds normal. No respiratory distress. He has no wheezes. He has no rales. He exhibits no tenderness.  Abdominal: Soft. Bowel sounds are normal.  Genitourinary:  Has 3 large non thrombosed external hemorrhoids, each about 2 cm in size. I did not see any condyloma, fissures, or any other lesions. Trace amount of blood in my gloves.  He has significant pain during the exam.     Musculoskeletal: Normal range of motion. He exhibits no edema or tenderness.  Neurological: He is alert and oriented to person, place, and time. No cranial nerve deficit. Coordination normal.    Filed Vitals:   11/30/14 1425  BP: 191/107  Pulse: 93  Temp: 98.2 F (36.8 C)        Assessment & Plan:  See problem based a&p.

## 2014-12-02 DIAGNOSIS — N2581 Secondary hyperparathyroidism of renal origin: Secondary | ICD-10-CM | POA: Diagnosis not present

## 2014-12-02 DIAGNOSIS — N186 End stage renal disease: Secondary | ICD-10-CM | POA: Diagnosis not present

## 2014-12-02 DIAGNOSIS — D631 Anemia in chronic kidney disease: Secondary | ICD-10-CM | POA: Diagnosis not present

## 2014-12-05 DIAGNOSIS — N186 End stage renal disease: Secondary | ICD-10-CM | POA: Diagnosis not present

## 2014-12-05 DIAGNOSIS — N2581 Secondary hyperparathyroidism of renal origin: Secondary | ICD-10-CM | POA: Diagnosis not present

## 2014-12-05 DIAGNOSIS — D631 Anemia in chronic kidney disease: Secondary | ICD-10-CM | POA: Diagnosis not present

## 2014-12-07 DIAGNOSIS — D631 Anemia in chronic kidney disease: Secondary | ICD-10-CM | POA: Diagnosis not present

## 2014-12-07 DIAGNOSIS — N186 End stage renal disease: Secondary | ICD-10-CM | POA: Diagnosis not present

## 2014-12-07 DIAGNOSIS — N2581 Secondary hyperparathyroidism of renal origin: Secondary | ICD-10-CM | POA: Diagnosis not present

## 2014-12-09 DIAGNOSIS — N186 End stage renal disease: Secondary | ICD-10-CM | POA: Diagnosis not present

## 2014-12-09 DIAGNOSIS — D631 Anemia in chronic kidney disease: Secondary | ICD-10-CM | POA: Diagnosis not present

## 2014-12-09 DIAGNOSIS — N2581 Secondary hyperparathyroidism of renal origin: Secondary | ICD-10-CM | POA: Diagnosis not present

## 2014-12-12 ENCOUNTER — Telehealth: Payer: Self-pay | Admitting: Internal Medicine

## 2014-12-12 ENCOUNTER — Other Ambulatory Visit: Payer: Self-pay | Admitting: Internal Medicine

## 2014-12-12 DIAGNOSIS — N2581 Secondary hyperparathyroidism of renal origin: Secondary | ICD-10-CM | POA: Diagnosis not present

## 2014-12-12 DIAGNOSIS — N186 End stage renal disease: Secondary | ICD-10-CM | POA: Diagnosis not present

## 2014-12-12 DIAGNOSIS — D631 Anemia in chronic kidney disease: Secondary | ICD-10-CM | POA: Diagnosis not present

## 2014-12-12 DIAGNOSIS — B20 Human immunodeficiency virus [HIV] disease: Secondary | ICD-10-CM

## 2014-12-12 MED ORDER — TENOFOVIR DISOPROXIL FUMARATE 300 MG PO TABS: 300.0000 mg | ORAL_TABLET | ORAL | Status: DC

## 2014-12-12 NOTE — Telephone Encounter (Signed)
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   Stop lamivudine and  Zidovudine.  Prescriptions have been called into pharmacy ( renally adjusted since he is now on HD). Called patient regarding these changes

## 2014-12-13 DIAGNOSIS — Z01810 Encounter for preprocedural cardiovascular examination: Secondary | ICD-10-CM | POA: Diagnosis not present

## 2014-12-13 DIAGNOSIS — Z7682 Awaiting organ transplant status: Secondary | ICD-10-CM | POA: Diagnosis not present

## 2014-12-13 DIAGNOSIS — Z21 Asymptomatic human immunodeficiency virus [HIV] infection status: Secondary | ICD-10-CM | POA: Diagnosis not present

## 2014-12-13 DIAGNOSIS — I517 Cardiomegaly: Secondary | ICD-10-CM | POA: Diagnosis not present

## 2014-12-14 DIAGNOSIS — N186 End stage renal disease: Secondary | ICD-10-CM | POA: Diagnosis not present

## 2014-12-14 DIAGNOSIS — D631 Anemia in chronic kidney disease: Secondary | ICD-10-CM | POA: Diagnosis not present

## 2014-12-14 DIAGNOSIS — N2581 Secondary hyperparathyroidism of renal origin: Secondary | ICD-10-CM | POA: Diagnosis not present

## 2014-12-15 ENCOUNTER — Encounter: Payer: Self-pay | Admitting: Internal Medicine

## 2014-12-15 ENCOUNTER — Ambulatory Visit (INDEPENDENT_AMBULATORY_CARE_PROVIDER_SITE_OTHER): Payer: Medicare Other | Admitting: Internal Medicine

## 2014-12-15 VITALS — BP 161/70 | HR 88 | Temp 98.5°F | Ht 70.0 in | Wt 235.5 lb

## 2014-12-15 DIAGNOSIS — I10 Essential (primary) hypertension: Secondary | ICD-10-CM

## 2014-12-15 DIAGNOSIS — K648 Other hemorrhoids: Secondary | ICD-10-CM | POA: Diagnosis not present

## 2014-12-15 MED ORDER — AMLODIPINE BESYLATE 5 MG PO TABS
5.0000 mg | ORAL_TABLET | Freq: Every day | ORAL | Status: DC
Start: 2014-12-15 — End: 2015-03-19

## 2014-12-15 NOTE — Patient Instructions (Addendum)
Take Amlodipine 5 mg at night in addition to your Coreg 25mg  twice a day and Cardura for your blood pressure.  Follow up in about 2 months for your blood pressure.  See the surgeon about your hemorrhoid.

## 2014-12-15 NOTE — Assessment & Plan Note (Signed)
His BP remains uncontrolled today on coreg 25mg  BID + cardura 8mg  qhs + lasix 40mg  daily on non dialysis days per nephrologist.   I am not sure why cardura was added as he does not have any cardiomyopathy. I will leave it since he has been on it for many years.  Filed Vitals:   12/15/14 1054  BP: 161/70  Pulse: 88  Temp: 98.5 F (36.9 C)    He does not have any leg swelling. He only takes lasix to keep volume down per patient.   Will add amlodipine 5mg  daily today (to take at night to make sure his BP does not drop with dialysis during the day).   Will reassess in 2 months.

## 2014-12-15 NOTE — Progress Notes (Signed)
   Subjective:    Patient ID: Darrell Johnson, male    DOB: 12/07/1965, 49 y.o.   MRN: [id number]  HPI  49 yo male with uncontrolled HTN, ESRD on HD T, Th, Sat here for follow up of HTN.  He is doing well other wise. He was seen last time with hemorrhoid which he states is better. He did not want me to examine the rectum today as he has appt with surgery this afternoon for their recs. He stated the hemorrhoid has become smaller in size.   No other complaints.  Review of Systems  Constitutional: Negative for fever and chills.  HENT: Negative for congestion and sore throat.   Eyes: Negative for visual disturbance.  Respiratory: Negative for cough, shortness of breath and wheezing.   Cardiovascular: Negative for chest pain, palpitations and leg swelling.  Gastrointestinal: Negative for nausea, vomiting, diarrhea and abdominal distention.       Hemorrhoids  Endocrine: Negative for polyuria.  Genitourinary: Negative for dysuria.  Musculoskeletal: Negative for back pain, joint swelling and neck pain.  Skin: Negative.   Allergic/Immunologic: Negative.   Neurological: Negative for dizziness, weakness and numbness.       Objective:   Physical Exam  Constitutional: He is oriented to person, place, and time. He appears well-developed and well-nourished. No distress.  HENT:  Head: Normocephalic and atraumatic.  Mouth/Throat: Oropharynx is clear and moist.  Eyes: Conjunctivae are normal. Pupils are equal, round, and reactive to light.  Neck: Normal range of motion.  Cardiovascular: Normal rate, regular rhythm and normal heart sounds.  Exam reveals no gallop and no friction rub.   No murmur heard. Pulmonary/Chest: Effort normal and breath sounds normal. No respiratory distress. He has no wheezes. He has no rales. He exhibits no tenderness.  Abdominal: Soft. Bowel sounds are normal. He exhibits no distension. There is no tenderness.  Musculoskeletal: Normal range of motion. He exhibits no  edema or tenderness.  Neurological: He is alert and oriented to person, place, and time. No cranial nerve deficit.  Skin: He is not diaphoretic.   Filed Vitals:   12/15/14 1054  BP: 161/70  Pulse: 88  Temp: 98.5 F (36.9 C)       Assessment & Plan:  See problem based a&p.

## 2014-12-16 DIAGNOSIS — N2581 Secondary hyperparathyroidism of renal origin: Secondary | ICD-10-CM | POA: Diagnosis not present

## 2014-12-16 DIAGNOSIS — N186 End stage renal disease: Secondary | ICD-10-CM | POA: Diagnosis not present

## 2014-12-16 DIAGNOSIS — D631 Anemia in chronic kidney disease: Secondary | ICD-10-CM | POA: Diagnosis not present

## 2014-12-19 DIAGNOSIS — I120 Hypertensive chronic kidney disease with stage 5 chronic kidney disease or end stage renal disease: Secondary | ICD-10-CM | POA: Diagnosis not present

## 2014-12-19 DIAGNOSIS — D631 Anemia in chronic kidney disease: Secondary | ICD-10-CM | POA: Diagnosis not present

## 2014-12-19 DIAGNOSIS — Z992 Dependence on renal dialysis: Secondary | ICD-10-CM | POA: Diagnosis not present

## 2014-12-19 DIAGNOSIS — N2581 Secondary hyperparathyroidism of renal origin: Secondary | ICD-10-CM | POA: Diagnosis not present

## 2014-12-19 DIAGNOSIS — N186 End stage renal disease: Secondary | ICD-10-CM | POA: Diagnosis not present

## 2014-12-20 ENCOUNTER — Encounter: Payer: Medicare Other | Admitting: Internal Medicine

## 2014-12-20 ENCOUNTER — Telehealth: Payer: Self-pay | Admitting: Pharmacist

## 2014-12-20 NOTE — Telephone Encounter (Signed)
Contacted patient for medication assistance. Patient still has not picked up BP medications from May 2016, states that he prefers to [facility] at [facility] [address] rather than [address]. [facility] notified [facility] to [facility] to [facility] location. Patient advised to contact clinic if further medication concerns.

## 2014-12-20 NOTE — Progress Notes (Signed)
Internal Medicine Clinic Attending  Case discussed with Dr. Ahmed at the time of the visit.  We reviewed the resident's history and exam and pertinent patient test results.  I agree with the assessment, diagnosis, and plan of care documented in the resident's note. 

## 2014-12-21 DIAGNOSIS — N186 End stage renal disease: Secondary | ICD-10-CM | POA: Diagnosis not present

## 2014-12-21 DIAGNOSIS — N2581 Secondary hyperparathyroidism of renal origin: Secondary | ICD-10-CM | POA: Diagnosis not present

## 2014-12-23 DIAGNOSIS — N2581 Secondary hyperparathyroidism of renal origin: Secondary | ICD-10-CM | POA: Diagnosis not present

## 2014-12-23 DIAGNOSIS — N186 End stage renal disease: Secondary | ICD-10-CM | POA: Diagnosis not present

## 2014-12-26 DIAGNOSIS — N186 End stage renal disease: Secondary | ICD-10-CM | POA: Diagnosis not present

## 2014-12-26 DIAGNOSIS — N2581 Secondary hyperparathyroidism of renal origin: Secondary | ICD-10-CM | POA: Diagnosis not present

## 2014-12-28 DIAGNOSIS — N186 End stage renal disease: Secondary | ICD-10-CM | POA: Diagnosis not present

## 2014-12-28 DIAGNOSIS — N2581 Secondary hyperparathyroidism of renal origin: Secondary | ICD-10-CM | POA: Diagnosis not present

## 2014-12-30 DIAGNOSIS — N186 End stage renal disease: Secondary | ICD-10-CM | POA: Diagnosis not present

## 2014-12-30 DIAGNOSIS — N2581 Secondary hyperparathyroidism of renal origin: Secondary | ICD-10-CM | POA: Diagnosis not present

## 2015-01-02 DIAGNOSIS — N186 End stage renal disease: Secondary | ICD-10-CM | POA: Diagnosis not present

## 2015-01-02 DIAGNOSIS — N2581 Secondary hyperparathyroidism of renal origin: Secondary | ICD-10-CM | POA: Diagnosis not present

## 2015-01-03 ENCOUNTER — Other Ambulatory Visit: Payer: Self-pay | Admitting: Internal Medicine

## 2015-01-04 DIAGNOSIS — N186 End stage renal disease: Secondary | ICD-10-CM | POA: Diagnosis not present

## 2015-01-04 DIAGNOSIS — N2581 Secondary hyperparathyroidism of renal origin: Secondary | ICD-10-CM | POA: Diagnosis not present

## 2015-01-06 DIAGNOSIS — N186 End stage renal disease: Secondary | ICD-10-CM | POA: Diagnosis not present

## 2015-01-06 DIAGNOSIS — N2581 Secondary hyperparathyroidism of renal origin: Secondary | ICD-10-CM | POA: Diagnosis not present

## 2015-01-09 DIAGNOSIS — N186 End stage renal disease: Secondary | ICD-10-CM | POA: Diagnosis not present

## 2015-01-09 DIAGNOSIS — N2581 Secondary hyperparathyroidism of renal origin: Secondary | ICD-10-CM | POA: Diagnosis not present

## 2015-01-11 DIAGNOSIS — N2581 Secondary hyperparathyroidism of renal origin: Secondary | ICD-10-CM | POA: Diagnosis not present

## 2015-01-11 DIAGNOSIS — N186 End stage renal disease: Secondary | ICD-10-CM | POA: Diagnosis not present

## 2015-01-13 DIAGNOSIS — N186 End stage renal disease: Secondary | ICD-10-CM | POA: Diagnosis not present

## 2015-01-13 DIAGNOSIS — N2581 Secondary hyperparathyroidism of renal origin: Secondary | ICD-10-CM | POA: Diagnosis not present

## 2015-01-16 DIAGNOSIS — M25562 Pain in left knee: Secondary | ICD-10-CM | POA: Diagnosis not present

## 2015-01-16 DIAGNOSIS — Z79899 Other long term (current) drug therapy: Secondary | ICD-10-CM | POA: Diagnosis not present

## 2015-01-16 DIAGNOSIS — M2550 Pain in unspecified joint: Secondary | ICD-10-CM | POA: Diagnosis not present

## 2015-01-16 DIAGNOSIS — M1000 Idiopathic gout, unspecified site: Secondary | ICD-10-CM | POA: Diagnosis not present

## 2015-01-16 DIAGNOSIS — M25461 Effusion, right knee: Secondary | ICD-10-CM | POA: Diagnosis not present

## 2015-01-17 DIAGNOSIS — N186 End stage renal disease: Secondary | ICD-10-CM | POA: Diagnosis not present

## 2015-01-17 DIAGNOSIS — N2581 Secondary hyperparathyroidism of renal origin: Secondary | ICD-10-CM | POA: Diagnosis not present

## 2015-01-18 DIAGNOSIS — N2581 Secondary hyperparathyroidism of renal origin: Secondary | ICD-10-CM | POA: Diagnosis not present

## 2015-01-18 DIAGNOSIS — N186 End stage renal disease: Secondary | ICD-10-CM | POA: Diagnosis not present

## 2015-01-18 DIAGNOSIS — Z992 Dependence on renal dialysis: Secondary | ICD-10-CM | POA: Diagnosis not present

## 2015-01-18 DIAGNOSIS — I120 Hypertensive chronic kidney disease with stage 5 chronic kidney disease or end stage renal disease: Secondary | ICD-10-CM | POA: Diagnosis not present

## 2015-01-20 DIAGNOSIS — N186 End stage renal disease: Secondary | ICD-10-CM | POA: Diagnosis not present

## 2015-01-20 DIAGNOSIS — N2581 Secondary hyperparathyroidism of renal origin: Secondary | ICD-10-CM | POA: Diagnosis not present

## 2015-01-23 DIAGNOSIS — N186 End stage renal disease: Secondary | ICD-10-CM | POA: Diagnosis not present

## 2015-01-23 DIAGNOSIS — N2581 Secondary hyperparathyroidism of renal origin: Secondary | ICD-10-CM | POA: Diagnosis not present

## 2015-01-25 DIAGNOSIS — N2581 Secondary hyperparathyroidism of renal origin: Secondary | ICD-10-CM | POA: Diagnosis not present

## 2015-01-25 DIAGNOSIS — N186 End stage renal disease: Secondary | ICD-10-CM | POA: Diagnosis not present

## 2015-01-27 DIAGNOSIS — N186 End stage renal disease: Secondary | ICD-10-CM | POA: Diagnosis not present

## 2015-01-27 DIAGNOSIS — N2581 Secondary hyperparathyroidism of renal origin: Secondary | ICD-10-CM | POA: Diagnosis not present

## 2015-01-30 DIAGNOSIS — N186 End stage renal disease: Secondary | ICD-10-CM | POA: Diagnosis not present

## 2015-01-30 DIAGNOSIS — N2581 Secondary hyperparathyroidism of renal origin: Secondary | ICD-10-CM | POA: Diagnosis not present

## 2015-02-01 DIAGNOSIS — N186 End stage renal disease: Secondary | ICD-10-CM | POA: Diagnosis not present

## 2015-02-01 DIAGNOSIS — N2581 Secondary hyperparathyroidism of renal origin: Secondary | ICD-10-CM | POA: Diagnosis not present

## 2015-02-03 DIAGNOSIS — N186 End stage renal disease: Secondary | ICD-10-CM | POA: Diagnosis not present

## 2015-02-03 DIAGNOSIS — N2581 Secondary hyperparathyroidism of renal origin: Secondary | ICD-10-CM | POA: Diagnosis not present

## 2015-02-05 ENCOUNTER — Other Ambulatory Visit: Payer: Self-pay | Admitting: Infectious Diseases

## 2015-02-05 ENCOUNTER — Other Ambulatory Visit: Payer: Self-pay | Admitting: Internal Medicine

## 2015-02-05 ENCOUNTER — Telehealth: Payer: Self-pay | Admitting: *Deleted

## 2015-02-05 DIAGNOSIS — B20 Human immunodeficiency virus [HIV] disease: Secondary | ICD-10-CM

## 2015-02-05 MED ORDER — TENOFOVIR DISOPROXIL FUMARATE 300 MG PO TABS: 300.0000 mg | ORAL_TABLET | ORAL | Status: DC

## 2015-02-05 MED ORDER — RITONAVIR 100 MG PO TABS: ORAL_TABLET | ORAL | Status: DC

## 2015-02-05 NOTE — Telephone Encounter (Signed)
Pharmacy requested refill of combivir. This was discontinued by provider 11/2014.  RN contacted pharmacy, confirmed correct regimen of Isentress 400mg  twice daily, Prezista 800mg  daily, Norvir 100mg  daily, Viread 300 mg daily.  RN sent medications that needed refill to [facility] [address]. Landis Gandy, RN

## 2015-02-06 DIAGNOSIS — N186 End stage renal disease: Secondary | ICD-10-CM | POA: Diagnosis not present

## 2015-02-06 DIAGNOSIS — N2581 Secondary hyperparathyroidism of renal origin: Secondary | ICD-10-CM | POA: Diagnosis not present

## 2015-02-07 DIAGNOSIS — E8779 Other fluid overload: Secondary | ICD-10-CM | POA: Diagnosis not present

## 2015-02-07 DIAGNOSIS — N186 End stage renal disease: Secondary | ICD-10-CM | POA: Diagnosis not present

## 2015-02-08 DIAGNOSIS — N186 End stage renal disease: Secondary | ICD-10-CM | POA: Diagnosis not present

## 2015-02-08 DIAGNOSIS — N2581 Secondary hyperparathyroidism of renal origin: Secondary | ICD-10-CM | POA: Diagnosis not present

## 2015-02-09 DIAGNOSIS — N186 End stage renal disease: Secondary | ICD-10-CM | POA: Diagnosis not present

## 2015-02-09 DIAGNOSIS — N2581 Secondary hyperparathyroidism of renal origin: Secondary | ICD-10-CM | POA: Diagnosis not present

## 2015-02-13 DIAGNOSIS — N2581 Secondary hyperparathyroidism of renal origin: Secondary | ICD-10-CM | POA: Diagnosis not present

## 2015-02-13 DIAGNOSIS — N186 End stage renal disease: Secondary | ICD-10-CM | POA: Diagnosis not present

## 2015-02-14 ENCOUNTER — Ambulatory Visit: Payer: Medicare Other | Admitting: Internal Medicine

## 2015-02-15 DIAGNOSIS — N2581 Secondary hyperparathyroidism of renal origin: Secondary | ICD-10-CM | POA: Diagnosis not present

## 2015-02-15 DIAGNOSIS — N186 End stage renal disease: Secondary | ICD-10-CM | POA: Diagnosis not present

## 2015-02-17 DIAGNOSIS — N2581 Secondary hyperparathyroidism of renal origin: Secondary | ICD-10-CM | POA: Diagnosis not present

## 2015-02-17 DIAGNOSIS — N186 End stage renal disease: Secondary | ICD-10-CM | POA: Diagnosis not present

## 2015-02-18 DIAGNOSIS — I120 Hypertensive chronic kidney disease with stage 5 chronic kidney disease or end stage renal disease: Secondary | ICD-10-CM | POA: Diagnosis not present

## 2015-02-18 DIAGNOSIS — N186 End stage renal disease: Secondary | ICD-10-CM | POA: Diagnosis not present

## 2015-02-18 DIAGNOSIS — Z992 Dependence on renal dialysis: Secondary | ICD-10-CM | POA: Diagnosis not present

## 2015-02-20 DIAGNOSIS — N186 End stage renal disease: Secondary | ICD-10-CM | POA: Diagnosis not present

## 2015-02-20 DIAGNOSIS — D631 Anemia in chronic kidney disease: Secondary | ICD-10-CM | POA: Diagnosis not present

## 2015-02-20 DIAGNOSIS — N2581 Secondary hyperparathyroidism of renal origin: Secondary | ICD-10-CM | POA: Diagnosis not present

## 2015-02-20 DIAGNOSIS — D509 Iron deficiency anemia, unspecified: Secondary | ICD-10-CM | POA: Diagnosis not present

## 2015-02-22 DIAGNOSIS — N186 End stage renal disease: Secondary | ICD-10-CM | POA: Diagnosis not present

## 2015-02-22 DIAGNOSIS — N2581 Secondary hyperparathyroidism of renal origin: Secondary | ICD-10-CM | POA: Diagnosis not present

## 2015-02-22 DIAGNOSIS — D631 Anemia in chronic kidney disease: Secondary | ICD-10-CM | POA: Diagnosis not present

## 2015-02-22 DIAGNOSIS — D509 Iron deficiency anemia, unspecified: Secondary | ICD-10-CM | POA: Diagnosis not present

## 2015-02-24 DIAGNOSIS — D631 Anemia in chronic kidney disease: Secondary | ICD-10-CM | POA: Diagnosis not present

## 2015-02-24 DIAGNOSIS — N2581 Secondary hyperparathyroidism of renal origin: Secondary | ICD-10-CM | POA: Diagnosis not present

## 2015-02-24 DIAGNOSIS — N186 End stage renal disease: Secondary | ICD-10-CM | POA: Diagnosis not present

## 2015-02-24 DIAGNOSIS — D509 Iron deficiency anemia, unspecified: Secondary | ICD-10-CM | POA: Diagnosis not present

## 2015-02-27 DIAGNOSIS — D509 Iron deficiency anemia, unspecified: Secondary | ICD-10-CM | POA: Diagnosis not present

## 2015-02-27 DIAGNOSIS — D631 Anemia in chronic kidney disease: Secondary | ICD-10-CM | POA: Diagnosis not present

## 2015-02-27 DIAGNOSIS — N186 End stage renal disease: Secondary | ICD-10-CM | POA: Diagnosis not present

## 2015-02-27 DIAGNOSIS — N2581 Secondary hyperparathyroidism of renal origin: Secondary | ICD-10-CM | POA: Diagnosis not present

## 2015-03-01 DIAGNOSIS — N2581 Secondary hyperparathyroidism of renal origin: Secondary | ICD-10-CM | POA: Diagnosis not present

## 2015-03-01 DIAGNOSIS — N186 End stage renal disease: Secondary | ICD-10-CM | POA: Diagnosis not present

## 2015-03-01 DIAGNOSIS — D631 Anemia in chronic kidney disease: Secondary | ICD-10-CM | POA: Diagnosis not present

## 2015-03-01 DIAGNOSIS — D509 Iron deficiency anemia, unspecified: Secondary | ICD-10-CM | POA: Diagnosis not present

## 2015-03-03 DIAGNOSIS — D631 Anemia in chronic kidney disease: Secondary | ICD-10-CM | POA: Diagnosis not present

## 2015-03-03 DIAGNOSIS — N2581 Secondary hyperparathyroidism of renal origin: Secondary | ICD-10-CM | POA: Diagnosis not present

## 2015-03-03 DIAGNOSIS — N186 End stage renal disease: Secondary | ICD-10-CM | POA: Diagnosis not present

## 2015-03-03 DIAGNOSIS — D509 Iron deficiency anemia, unspecified: Secondary | ICD-10-CM | POA: Diagnosis not present

## 2015-03-06 DIAGNOSIS — N2581 Secondary hyperparathyroidism of renal origin: Secondary | ICD-10-CM | POA: Diagnosis not present

## 2015-03-06 DIAGNOSIS — D631 Anemia in chronic kidney disease: Secondary | ICD-10-CM | POA: Diagnosis not present

## 2015-03-06 DIAGNOSIS — N186 End stage renal disease: Secondary | ICD-10-CM | POA: Diagnosis not present

## 2015-03-06 DIAGNOSIS — D509 Iron deficiency anemia, unspecified: Secondary | ICD-10-CM | POA: Diagnosis not present

## 2015-03-08 DIAGNOSIS — D509 Iron deficiency anemia, unspecified: Secondary | ICD-10-CM | POA: Diagnosis not present

## 2015-03-08 DIAGNOSIS — N2581 Secondary hyperparathyroidism of renal origin: Secondary | ICD-10-CM | POA: Diagnosis not present

## 2015-03-08 DIAGNOSIS — N186 End stage renal disease: Secondary | ICD-10-CM | POA: Diagnosis not present

## 2015-03-08 DIAGNOSIS — D631 Anemia in chronic kidney disease: Secondary | ICD-10-CM | POA: Diagnosis not present

## 2015-03-12 DIAGNOSIS — N186 End stage renal disease: Secondary | ICD-10-CM | POA: Diagnosis not present

## 2015-03-12 DIAGNOSIS — D509 Iron deficiency anemia, unspecified: Secondary | ICD-10-CM | POA: Diagnosis not present

## 2015-03-12 DIAGNOSIS — D631 Anemia in chronic kidney disease: Secondary | ICD-10-CM | POA: Diagnosis not present

## 2015-03-12 DIAGNOSIS — N2581 Secondary hyperparathyroidism of renal origin: Secondary | ICD-10-CM | POA: Diagnosis not present

## 2015-03-13 DIAGNOSIS — D509 Iron deficiency anemia, unspecified: Secondary | ICD-10-CM | POA: Diagnosis not present

## 2015-03-13 DIAGNOSIS — N2581 Secondary hyperparathyroidism of renal origin: Secondary | ICD-10-CM | POA: Diagnosis not present

## 2015-03-13 DIAGNOSIS — N186 End stage renal disease: Secondary | ICD-10-CM | POA: Diagnosis not present

## 2015-03-13 DIAGNOSIS — D631 Anemia in chronic kidney disease: Secondary | ICD-10-CM | POA: Diagnosis not present

## 2015-03-15 DIAGNOSIS — D509 Iron deficiency anemia, unspecified: Secondary | ICD-10-CM | POA: Diagnosis not present

## 2015-03-15 DIAGNOSIS — D631 Anemia in chronic kidney disease: Secondary | ICD-10-CM | POA: Diagnosis not present

## 2015-03-15 DIAGNOSIS — N186 End stage renal disease: Secondary | ICD-10-CM | POA: Diagnosis not present

## 2015-03-15 DIAGNOSIS — N2581 Secondary hyperparathyroidism of renal origin: Secondary | ICD-10-CM | POA: Diagnosis not present

## 2015-03-17 DIAGNOSIS — D631 Anemia in chronic kidney disease: Secondary | ICD-10-CM | POA: Diagnosis not present

## 2015-03-17 DIAGNOSIS — N2581 Secondary hyperparathyroidism of renal origin: Secondary | ICD-10-CM | POA: Diagnosis not present

## 2015-03-17 DIAGNOSIS — N186 End stage renal disease: Secondary | ICD-10-CM | POA: Diagnosis not present

## 2015-03-17 DIAGNOSIS — D509 Iron deficiency anemia, unspecified: Secondary | ICD-10-CM | POA: Diagnosis not present

## 2015-03-19 ENCOUNTER — Ambulatory Visit (INDEPENDENT_AMBULATORY_CARE_PROVIDER_SITE_OTHER): Payer: Medicare Other | Admitting: Internal Medicine

## 2015-03-19 VITALS — BP 162/81 | HR 84 | Temp 99.2°F | Ht 70.0 in | Wt 247.4 lb

## 2015-03-19 DIAGNOSIS — R519 Headache, unspecified: Secondary | ICD-10-CM | POA: Insufficient documentation

## 2015-03-19 DIAGNOSIS — R51 Headache: Secondary | ICD-10-CM

## 2015-03-19 DIAGNOSIS — I120 Hypertensive chronic kidney disease with stage 5 chronic kidney disease or end stage renal disease: Secondary | ICD-10-CM | POA: Diagnosis not present

## 2015-03-19 DIAGNOSIS — I2510 Atherosclerotic heart disease of native coronary artery without angina pectoris: Secondary | ICD-10-CM

## 2015-03-19 DIAGNOSIS — Z992 Dependence on renal dialysis: Secondary | ICD-10-CM | POA: Diagnosis not present

## 2015-03-19 DIAGNOSIS — G4452 New daily persistent headache (NDPH): Secondary | ICD-10-CM

## 2015-03-19 DIAGNOSIS — N186 End stage renal disease: Secondary | ICD-10-CM | POA: Diagnosis not present

## 2015-03-19 DIAGNOSIS — I10 Essential (primary) hypertension: Secondary | ICD-10-CM

## 2015-03-19 DIAGNOSIS — G894 Chronic pain syndrome: Secondary | ICD-10-CM

## 2015-03-19 DIAGNOSIS — M25569 Pain in unspecified knee: Secondary | ICD-10-CM | POA: Diagnosis not present

## 2015-03-19 MED ORDER — VERAPAMIL HCL ER 120 MG PO TBCR: 120.0000 mg | EXTENDED_RELEASE_TABLET | Freq: Every day | ORAL | Status: DC

## 2015-03-19 NOTE — Assessment & Plan Note (Signed)
Has chronic knee pain. Xray showed OA. Used to get steroid injection by Dr. Trudie Reed but his insurance is not covering the visits anymore.   Will refer to sports medicine of knee injections.

## 2015-03-19 NOTE — Assessment & Plan Note (Signed)
Has been having headache after starting dialysis. He had it in the past but it's much worse now with HD. It's bilateral, with radiation to the face/neck. Has some light sensitivity, wearing a hat. Tylenol did not help. Asking for Norco.  This is likely post dialysis disequilibrium related headache or tension headache. Options limited with his CAD and ESRD.   Will try switching his amlodipine to Verapamil as some case studies showed it to be helpful with post HD headaches. Have to be careful with Verapamil as he is already on Coreg.

## 2015-03-19 NOTE — Assessment & Plan Note (Addendum)
Filed Vitals:   03/19/15 1504  BP: 162/81  Pulse: 84  Temp: 99.2 F (37.3 C)   Currently on coreg 25mg  bid, cardura 8mg , lasix 40mg  daily on non hd days per nephro and also amlodipine 5mg  daily at night.  BP remains elevated. Will change amlodpine to Verapamil 120mg  daily and see if this helps his headache too. Will see if he tolerates this. His HR is 84 today. If HR decreases, may decrease coreg if Headache is better on verapamil.

## 2015-03-19 NOTE — Progress Notes (Signed)
   Subjective:    Patient ID: Darrell Johnson, male    DOB: [birth year], 49 y.o.   MRN: [id number]  HPI  49 yo male with ESRD on HD T, Th, SAT, uncontrolled HTN, HIV last CD4 270, follows with ID, here for follow up with HTN.  Also complaining about headache and knee pain.  Used to go see Dr. Trudie Reed for steroid injection to the knee. His insurance is not covering her visit anymore.  Has chronic headache, worse after dialysis and gets better on non dialysis days. It's throbbing, tylenol has not helped. Asking to get hydrocodone again for his headache as it did help in the past.    Review of Systems  Constitutional: Negative for fever and chills.  HENT: Negative for congestion, sore throat and voice change.   Eyes: Positive for photophobia. Negative for discharge, itching and visual disturbance.  Respiratory: Negative for chest tightness, shortness of breath and wheezing.   Cardiovascular: Negative for chest pain, palpitations and leg swelling.  Gastrointestinal: Negative for nausea, diarrhea and abdominal distention.  Endocrine: Negative.   Genitourinary: Negative for dysuria and hematuria.  Musculoskeletal: Negative for myalgias and back pain.  Neurological: Positive for headaches. Negative for dizziness, seizures, syncope and numbness.       Objective:   Physical Exam  Constitutional: He is oriented to person, place, and time.  Sitting in chair, eyes covered with his hat.   HENT:  Head: Normocephalic and atraumatic.  Mouth/Throat: No oropharyngeal exudate.  Eyes: EOM are normal. Pupils are equal, round, and reactive to light.  Neck: Normal range of motion.  Cardiovascular: Normal rate and regular rhythm.  Exam reveals no gallop and no friction rub.   No murmur heard. Pulmonary/Chest: Effort normal and breath sounds normal. No respiratory distress. He has no wheezes. He has no rales.  Abdominal: Soft. Bowel sounds are normal. He exhibits no distension and no mass. There is no  rebound.  Musculoskeletal: Normal range of motion. He exhibits no edema or tenderness.  Neurological: He is alert and oriented to person, place, and time. No cranial nerve deficit.     Filed Vitals:   03/19/15 1504  BP: 162/81  Pulse: 84  Temp: 99.2 F (37.3 C)        Assessment & Plan:  See problem based a&p.

## 2015-03-19 NOTE — Patient Instructions (Signed)
Please stop taking amlodopine and start taking Verapamil. This is supposed to help with your blood pressure and also your headache.   Follow up in 6 weeks-8 weeks with Dr. Genene Churn.  Referred you to sports medicine

## 2015-03-20 ENCOUNTER — Encounter: Payer: Self-pay | Admitting: Internal Medicine

## 2015-03-20 DIAGNOSIS — D631 Anemia in chronic kidney disease: Secondary | ICD-10-CM | POA: Diagnosis not present

## 2015-03-20 DIAGNOSIS — N2581 Secondary hyperparathyroidism of renal origin: Secondary | ICD-10-CM | POA: Diagnosis not present

## 2015-03-20 DIAGNOSIS — N186 End stage renal disease: Secondary | ICD-10-CM | POA: Diagnosis not present

## 2015-03-20 DIAGNOSIS — D509 Iron deficiency anemia, unspecified: Secondary | ICD-10-CM | POA: Diagnosis not present

## 2015-03-21 DIAGNOSIS — N186 End stage renal disease: Secondary | ICD-10-CM | POA: Diagnosis not present

## 2015-03-21 DIAGNOSIS — I120 Hypertensive chronic kidney disease with stage 5 chronic kidney disease or end stage renal disease: Secondary | ICD-10-CM | POA: Diagnosis not present

## 2015-03-21 DIAGNOSIS — Z992 Dependence on renal dialysis: Secondary | ICD-10-CM | POA: Diagnosis not present

## 2015-03-21 NOTE — Progress Notes (Signed)
Internal Medicine Clinic Attending  Case discussed with Dr. Genene Churn at the time of the visit.  We reviewed the resident's history and exam and pertinent patient test results.  I agree with the assessment, diagnosis, and plan of care documented in the resident's note.  Close monitoring due to starting both beta blocker and CCB.  Lowest dose of CCB should be used to try to help with the headaches.

## 2015-03-22 DIAGNOSIS — N186 End stage renal disease: Secondary | ICD-10-CM | POA: Diagnosis not present

## 2015-03-22 DIAGNOSIS — D631 Anemia in chronic kidney disease: Secondary | ICD-10-CM | POA: Diagnosis not present

## 2015-03-22 DIAGNOSIS — Z23 Encounter for immunization: Secondary | ICD-10-CM | POA: Diagnosis not present

## 2015-03-22 DIAGNOSIS — N2581 Secondary hyperparathyroidism of renal origin: Secondary | ICD-10-CM | POA: Diagnosis not present

## 2015-03-22 DIAGNOSIS — D509 Iron deficiency anemia, unspecified: Secondary | ICD-10-CM | POA: Diagnosis not present

## 2015-03-24 DIAGNOSIS — D509 Iron deficiency anemia, unspecified: Secondary | ICD-10-CM | POA: Diagnosis not present

## 2015-03-24 DIAGNOSIS — N2581 Secondary hyperparathyroidism of renal origin: Secondary | ICD-10-CM | POA: Diagnosis not present

## 2015-03-24 DIAGNOSIS — D631 Anemia in chronic kidney disease: Secondary | ICD-10-CM | POA: Diagnosis not present

## 2015-03-24 DIAGNOSIS — N186 End stage renal disease: Secondary | ICD-10-CM | POA: Diagnosis not present

## 2015-03-24 DIAGNOSIS — Z23 Encounter for immunization: Secondary | ICD-10-CM | POA: Diagnosis not present

## 2015-03-27 DIAGNOSIS — D631 Anemia in chronic kidney disease: Secondary | ICD-10-CM | POA: Diagnosis not present

## 2015-03-27 DIAGNOSIS — Z23 Encounter for immunization: Secondary | ICD-10-CM | POA: Diagnosis not present

## 2015-03-27 DIAGNOSIS — N2581 Secondary hyperparathyroidism of renal origin: Secondary | ICD-10-CM | POA: Diagnosis not present

## 2015-03-27 DIAGNOSIS — D509 Iron deficiency anemia, unspecified: Secondary | ICD-10-CM | POA: Diagnosis not present

## 2015-03-27 DIAGNOSIS — N186 End stage renal disease: Secondary | ICD-10-CM | POA: Diagnosis not present

## 2015-03-28 ENCOUNTER — Ambulatory Visit: Payer: Medicare Other | Admitting: Family Medicine

## 2015-03-28 DIAGNOSIS — K648 Other hemorrhoids: Secondary | ICD-10-CM | POA: Diagnosis not present

## 2015-03-28 NOTE — Addendum Note (Signed)
Addended by: Hulan Fray on: 03/28/2015 09:25 PM   Modules accepted: Orders

## 2015-03-29 DIAGNOSIS — D631 Anemia in chronic kidney disease: Secondary | ICD-10-CM | POA: Diagnosis not present

## 2015-03-29 DIAGNOSIS — D509 Iron deficiency anemia, unspecified: Secondary | ICD-10-CM | POA: Diagnosis not present

## 2015-03-29 DIAGNOSIS — N2581 Secondary hyperparathyroidism of renal origin: Secondary | ICD-10-CM | POA: Diagnosis not present

## 2015-03-29 DIAGNOSIS — N186 End stage renal disease: Secondary | ICD-10-CM | POA: Diagnosis not present

## 2015-03-29 DIAGNOSIS — Z23 Encounter for immunization: Secondary | ICD-10-CM | POA: Diagnosis not present

## 2015-03-31 DIAGNOSIS — Z23 Encounter for immunization: Secondary | ICD-10-CM | POA: Diagnosis not present

## 2015-03-31 DIAGNOSIS — D509 Iron deficiency anemia, unspecified: Secondary | ICD-10-CM | POA: Diagnosis not present

## 2015-03-31 DIAGNOSIS — N2581 Secondary hyperparathyroidism of renal origin: Secondary | ICD-10-CM | POA: Diagnosis not present

## 2015-03-31 DIAGNOSIS — D631 Anemia in chronic kidney disease: Secondary | ICD-10-CM | POA: Diagnosis not present

## 2015-03-31 DIAGNOSIS — N186 End stage renal disease: Secondary | ICD-10-CM | POA: Diagnosis not present

## 2015-04-03 DIAGNOSIS — D631 Anemia in chronic kidney disease: Secondary | ICD-10-CM | POA: Diagnosis not present

## 2015-04-03 DIAGNOSIS — N186 End stage renal disease: Secondary | ICD-10-CM | POA: Diagnosis not present

## 2015-04-03 DIAGNOSIS — Z23 Encounter for immunization: Secondary | ICD-10-CM | POA: Diagnosis not present

## 2015-04-03 DIAGNOSIS — N2581 Secondary hyperparathyroidism of renal origin: Secondary | ICD-10-CM | POA: Diagnosis not present

## 2015-04-03 DIAGNOSIS — D509 Iron deficiency anemia, unspecified: Secondary | ICD-10-CM | POA: Diagnosis not present

## 2015-04-05 DIAGNOSIS — D509 Iron deficiency anemia, unspecified: Secondary | ICD-10-CM | POA: Diagnosis not present

## 2015-04-05 DIAGNOSIS — N186 End stage renal disease: Secondary | ICD-10-CM | POA: Diagnosis not present

## 2015-04-05 DIAGNOSIS — Z23 Encounter for immunization: Secondary | ICD-10-CM | POA: Diagnosis not present

## 2015-04-05 DIAGNOSIS — N2581 Secondary hyperparathyroidism of renal origin: Secondary | ICD-10-CM | POA: Diagnosis not present

## 2015-04-05 DIAGNOSIS — D631 Anemia in chronic kidney disease: Secondary | ICD-10-CM | POA: Diagnosis not present

## 2015-04-07 DIAGNOSIS — D631 Anemia in chronic kidney disease: Secondary | ICD-10-CM | POA: Diagnosis not present

## 2015-04-07 DIAGNOSIS — Z23 Encounter for immunization: Secondary | ICD-10-CM | POA: Diagnosis not present

## 2015-04-07 DIAGNOSIS — N2581 Secondary hyperparathyroidism of renal origin: Secondary | ICD-10-CM | POA: Diagnosis not present

## 2015-04-07 DIAGNOSIS — D509 Iron deficiency anemia, unspecified: Secondary | ICD-10-CM | POA: Diagnosis not present

## 2015-04-07 DIAGNOSIS — N186 End stage renal disease: Secondary | ICD-10-CM | POA: Diagnosis not present

## 2015-04-10 DIAGNOSIS — Z23 Encounter for immunization: Secondary | ICD-10-CM | POA: Diagnosis not present

## 2015-04-10 DIAGNOSIS — N2581 Secondary hyperparathyroidism of renal origin: Secondary | ICD-10-CM | POA: Diagnosis not present

## 2015-04-10 DIAGNOSIS — D631 Anemia in chronic kidney disease: Secondary | ICD-10-CM | POA: Diagnosis not present

## 2015-04-10 DIAGNOSIS — D509 Iron deficiency anemia, unspecified: Secondary | ICD-10-CM | POA: Diagnosis not present

## 2015-04-10 DIAGNOSIS — N186 End stage renal disease: Secondary | ICD-10-CM | POA: Diagnosis not present

## 2015-04-12 DIAGNOSIS — N186 End stage renal disease: Secondary | ICD-10-CM | POA: Diagnosis not present

## 2015-04-12 DIAGNOSIS — N2581 Secondary hyperparathyroidism of renal origin: Secondary | ICD-10-CM | POA: Diagnosis not present

## 2015-04-12 DIAGNOSIS — D631 Anemia in chronic kidney disease: Secondary | ICD-10-CM | POA: Diagnosis not present

## 2015-04-12 DIAGNOSIS — D509 Iron deficiency anemia, unspecified: Secondary | ICD-10-CM | POA: Diagnosis not present

## 2015-04-12 DIAGNOSIS — Z23 Encounter for immunization: Secondary | ICD-10-CM | POA: Diagnosis not present

## 2015-04-13 DIAGNOSIS — N186 End stage renal disease: Secondary | ICD-10-CM | POA: Diagnosis not present

## 2015-04-13 DIAGNOSIS — T82858A Stenosis of vascular prosthetic devices, implants and grafts, initial encounter: Secondary | ICD-10-CM | POA: Diagnosis not present

## 2015-04-13 DIAGNOSIS — Z992 Dependence on renal dialysis: Secondary | ICD-10-CM | POA: Diagnosis not present

## 2015-04-13 DIAGNOSIS — I771 Stricture of artery: Secondary | ICD-10-CM | POA: Diagnosis not present

## 2015-04-14 DIAGNOSIS — D631 Anemia in chronic kidney disease: Secondary | ICD-10-CM | POA: Diagnosis not present

## 2015-04-14 DIAGNOSIS — N2581 Secondary hyperparathyroidism of renal origin: Secondary | ICD-10-CM | POA: Diagnosis not present

## 2015-04-14 DIAGNOSIS — N186 End stage renal disease: Secondary | ICD-10-CM | POA: Diagnosis not present

## 2015-04-14 DIAGNOSIS — D509 Iron deficiency anemia, unspecified: Secondary | ICD-10-CM | POA: Diagnosis not present

## 2015-04-14 DIAGNOSIS — Z23 Encounter for immunization: Secondary | ICD-10-CM | POA: Diagnosis not present

## 2015-04-17 DIAGNOSIS — N2581 Secondary hyperparathyroidism of renal origin: Secondary | ICD-10-CM | POA: Diagnosis not present

## 2015-04-17 DIAGNOSIS — Z23 Encounter for immunization: Secondary | ICD-10-CM | POA: Diagnosis not present

## 2015-04-17 DIAGNOSIS — D509 Iron deficiency anemia, unspecified: Secondary | ICD-10-CM | POA: Diagnosis not present

## 2015-04-17 DIAGNOSIS — D631 Anemia in chronic kidney disease: Secondary | ICD-10-CM | POA: Diagnosis not present

## 2015-04-17 DIAGNOSIS — N186 End stage renal disease: Secondary | ICD-10-CM | POA: Diagnosis not present

## 2015-04-19 DIAGNOSIS — D631 Anemia in chronic kidney disease: Secondary | ICD-10-CM | POA: Diagnosis not present

## 2015-04-19 DIAGNOSIS — N186 End stage renal disease: Secondary | ICD-10-CM | POA: Diagnosis not present

## 2015-04-19 DIAGNOSIS — N2581 Secondary hyperparathyroidism of renal origin: Secondary | ICD-10-CM | POA: Diagnosis not present

## 2015-04-19 DIAGNOSIS — D509 Iron deficiency anemia, unspecified: Secondary | ICD-10-CM | POA: Diagnosis not present

## 2015-04-19 DIAGNOSIS — Z23 Encounter for immunization: Secondary | ICD-10-CM | POA: Diagnosis not present

## 2015-04-20 DIAGNOSIS — Z992 Dependence on renal dialysis: Secondary | ICD-10-CM | POA: Diagnosis not present

## 2015-04-20 DIAGNOSIS — I120 Hypertensive chronic kidney disease with stage 5 chronic kidney disease or end stage renal disease: Secondary | ICD-10-CM | POA: Diagnosis not present

## 2015-04-20 DIAGNOSIS — N186 End stage renal disease: Secondary | ICD-10-CM | POA: Diagnosis not present

## 2015-04-21 DIAGNOSIS — N186 End stage renal disease: Secondary | ICD-10-CM | POA: Diagnosis not present

## 2015-04-21 DIAGNOSIS — D631 Anemia in chronic kidney disease: Secondary | ICD-10-CM | POA: Diagnosis not present

## 2015-04-21 DIAGNOSIS — N2581 Secondary hyperparathyroidism of renal origin: Secondary | ICD-10-CM | POA: Diagnosis not present

## 2015-04-21 DIAGNOSIS — D509 Iron deficiency anemia, unspecified: Secondary | ICD-10-CM | POA: Diagnosis not present

## 2015-04-24 DIAGNOSIS — N2581 Secondary hyperparathyroidism of renal origin: Secondary | ICD-10-CM | POA: Diagnosis not present

## 2015-04-24 DIAGNOSIS — D631 Anemia in chronic kidney disease: Secondary | ICD-10-CM | POA: Diagnosis not present

## 2015-04-24 DIAGNOSIS — D509 Iron deficiency anemia, unspecified: Secondary | ICD-10-CM | POA: Diagnosis not present

## 2015-04-24 DIAGNOSIS — N186 End stage renal disease: Secondary | ICD-10-CM | POA: Diagnosis not present

## 2015-04-26 DIAGNOSIS — D509 Iron deficiency anemia, unspecified: Secondary | ICD-10-CM | POA: Diagnosis not present

## 2015-04-26 DIAGNOSIS — N2581 Secondary hyperparathyroidism of renal origin: Secondary | ICD-10-CM | POA: Diagnosis not present

## 2015-04-26 DIAGNOSIS — N186 End stage renal disease: Secondary | ICD-10-CM | POA: Diagnosis not present

## 2015-04-26 DIAGNOSIS — D631 Anemia in chronic kidney disease: Secondary | ICD-10-CM | POA: Diagnosis not present

## 2015-04-28 DIAGNOSIS — D631 Anemia in chronic kidney disease: Secondary | ICD-10-CM | POA: Diagnosis not present

## 2015-04-28 DIAGNOSIS — D509 Iron deficiency anemia, unspecified: Secondary | ICD-10-CM | POA: Diagnosis not present

## 2015-04-28 DIAGNOSIS — N186 End stage renal disease: Secondary | ICD-10-CM | POA: Diagnosis not present

## 2015-04-28 DIAGNOSIS — N2581 Secondary hyperparathyroidism of renal origin: Secondary | ICD-10-CM | POA: Diagnosis not present

## 2015-05-02 DIAGNOSIS — D509 Iron deficiency anemia, unspecified: Secondary | ICD-10-CM | POA: Diagnosis not present

## 2015-05-02 DIAGNOSIS — N2581 Secondary hyperparathyroidism of renal origin: Secondary | ICD-10-CM | POA: Diagnosis not present

## 2015-05-02 DIAGNOSIS — N186 End stage renal disease: Secondary | ICD-10-CM | POA: Diagnosis not present

## 2015-05-02 DIAGNOSIS — D631 Anemia in chronic kidney disease: Secondary | ICD-10-CM | POA: Diagnosis not present

## 2015-05-03 ENCOUNTER — Encounter: Payer: Self-pay | Admitting: Internal Medicine

## 2015-05-03 ENCOUNTER — Ambulatory Visit (INDEPENDENT_AMBULATORY_CARE_PROVIDER_SITE_OTHER): Payer: Medicare Other | Admitting: Internal Medicine

## 2015-05-03 VITALS — BP 161/88 | HR 104 | Temp 98.5°F | Ht 70.0 in | Wt 238.2 lb

## 2015-05-03 DIAGNOSIS — D631 Anemia in chronic kidney disease: Secondary | ICD-10-CM | POA: Diagnosis not present

## 2015-05-03 DIAGNOSIS — F17210 Nicotine dependence, cigarettes, uncomplicated: Secondary | ICD-10-CM

## 2015-05-03 DIAGNOSIS — R51 Headache: Secondary | ICD-10-CM

## 2015-05-03 DIAGNOSIS — D509 Iron deficiency anemia, unspecified: Secondary | ICD-10-CM | POA: Diagnosis not present

## 2015-05-03 DIAGNOSIS — R519 Headache, unspecified: Secondary | ICD-10-CM

## 2015-05-03 DIAGNOSIS — I10 Essential (primary) hypertension: Secondary | ICD-10-CM | POA: Diagnosis not present

## 2015-05-03 DIAGNOSIS — Z0000 Encounter for general adult medical examination without abnormal findings: Secondary | ICD-10-CM

## 2015-05-03 DIAGNOSIS — N2581 Secondary hyperparathyroidism of renal origin: Secondary | ICD-10-CM | POA: Diagnosis not present

## 2015-05-03 DIAGNOSIS — N186 End stage renal disease: Secondary | ICD-10-CM | POA: Diagnosis not present

## 2015-05-03 MED ORDER — FUROSEMIDE 40 MG PO TABS: 40.0000 mg | ORAL_TABLET | Freq: Every day | ORAL | Status: DC

## 2015-05-03 MED ORDER — AMLODIPINE BESYLATE 5 MG PO TABS: 5.0000 mg | ORAL_TABLET | Freq: Every day | ORAL | Status: DC

## 2015-05-03 MED ORDER — CARVEDILOL 25 MG PO TABS: 25.0000 mg | ORAL_TABLET | Freq: Two times a day (BID) | ORAL | Status: DC

## 2015-05-03 NOTE — Progress Notes (Signed)
Patient ID: Darrell Johnson, male   DOB: 11-17-1965, 49 y.o.   MRN: [id number]   Subjective:   Patient ID: Darrell Johnson male   DOB: [birth year] 49 y.o.   MRN: [id number]  HPI: [name] is a 49 y.o. male with ESRD on HD (TThS), uncontrolled HTN, HIV (CD4 of 270 in April) who presents to [facility] today for HTN follow up.  Today, BP is elevated at 161/88 whereas at last [facility] visit was 162/81.  At that visit patient also complained of post-HD headaches that began when he initiated HD treatment a few months ago.  States the headaches begin after HD and usually last for a day and then subside right about the time he is to go to his next HD session.  At last visit, patients amlodipine was stopped and verapimil was added to help with possible disequilibrium headache associated with HD.  Patient still complaining of similar type headache that have not responded to Verapamil.  Still describes as bilateral, radiating to neck and face area.  Patient did not take his BP medicine today but otherwise reports compliance.    Past Medical History  Diagnosis Date  . 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 Hypertension   . Seizures ([place])     last seizure was >5 years ago, pt has family history of seizures  . Syphilis 1997    history of syphilis 1997  . Chronic kidney disease     stage 3-4 CKD, followed by Dr. Moshe Cipro  . 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 Rib fractures 01/2009  . HIV infection ([place]) 1980's    on ART therapy since, followed by ID clinic, complicated  by neuropathy  . 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 Pneumonia   . Arthritis   . Anemia   . HYPERTENSION 05/08/2006  . Gout, unspecified 08/13/2009    Qualifier: Diagnosis of  By: Redmond Pulling  MD, Mateo Flow    . CHF (congestive heart failure) ([place])     Current Outpatient Prescriptions  Medication Sig Dispense Refill  . amLODipine (NORVASC) 5 MG tablet Take 1 tablet (5 mg total) by mouth daily. 30 tablet 2  . calcitRIOL (ROCALTROL) 0.5 MCG capsule Take 0.5 mcg by mouth daily. Given at dialysis    . carvedilol (COREG) 25 MG tablet Take 1 tablet (25 mg total) by mouth 2 (two) times daily with a meal. 60 tablet 2  . doxazosin (CARDURA) 8 MG tablet TAKE 1 TABLET (8 MG TOTAL) BY MOUTH AT BEDTIME. 30 tablet 5  . Febuxostat (ULORIC) 80 MG TABS Take 80 mg by mouth daily.    . furosemide (LASIX) 40 MG tablet Take 1 tablet (40 mg total) by mouth daily. 30 tablet 2  . HYDROcodone-acetaminophen (NORCO) 10-325 MG per tablet Take 1 tablet by mouth every 8 (eight) hours as needed. 30 tablet 0  . hydrocortisone (ANUSOL-HC) 2.5 % rectal cream Apply rectally 2 times daily 28.35 g 0  . ISENTRESS 400 MG tablet TAKE 1 TABLET BY MOUTH TWICE A DAY 60 tablet 8  . PREZISTA 800 MG tablet TAKE 1 TABLET (800 MG TOTAL) BY MOUTH DAILY WITH BREAKFAST. 30 tablet 5  . RENVELA 800 MG tablet Take 800 mg by mouth 3 (three) times daily with meals.     . ritonavir (  NORVIR) 100 MG TABS tablet TAKE 1 TABLET (100MG  TOTAL) BY MOUTH DAILY 30 tablet 5  . sodium bicarbonate 650 MG tablet Take 650 mg by mouth 2 (two) times daily.  6  . tenofovir (VIREAD) 300 MG tablet Take 1 tablet (300 mg total) by mouth once a week. 12 tablet 5   No current facility-administered medications for this visit.   Family History  Problem Relation Age of Onset  . Cancer Mother   . COPD Father   . Diabetes Sister   . Hypertension Sister   . Diabetes Brother   . Hypertension Brother   . Stroke Neg Hx    Social History   Social History  . Marital Status: Married    Spouse Name: N/A  . Number of Children: N/A  . Years of Education: N/A   Social History Main Topics  . Smoking status: Current Every Day Smoker -- 0.25 packs/day for 20 years    Types: Cigarettes    Last Attempt to Quit:  09/26/2014  . Smokeless tobacco: Never Used     Comment: Trying to quit.  . Alcohol Use: No  . Drug Use: No  . Sexual Activity: Not Asked     Comment: given condoms   Other Topics Concern  . None   Social History Narrative   Review of Systems: Review of Systems  Constitutional: Negative for fever and chills.  HENT:       Reports headache  Eyes: Negative for pain and visual disturbance.  Respiratory: Negative for cough and shortness of breath.   Cardiovascular: Negative for chest pain.  Gastrointestinal: Negative for nausea, vomiting and abdominal pain.  Genitourinary: Negative for dysuria and frequency.  Musculoskeletal: Positive for arthralgias (chronic knee OA).  Skin: Negative for color change.  Neurological: Positive for headaches. Negative for dizziness.  Psychiatric/Behavioral: Negative for confusion.    Objective:  Physical Exam: Filed Vitals:   05/03/15 1535  BP: 161/88  Pulse: 104  Temp: 98.5 F (36.9 C)  TempSrc: Oral  Height: 5\' 10"  (1.778 m)  Weight: 238 lb 3.2 oz (108.047 kg)  SpO2: 99%   Physical Exam  Constitutional: He is oriented to person, place, and time. He appears well-developed and well-nourished.  Tired appearing, wearing hat, had HD today  HENT:  Head: Normocephalic and atraumatic.  Eyes: EOM are normal.  Neck: Normal range of motion.  Cardiovascular: Regular rhythm and normal heart sounds.   Mildly tachycardic  Respiratory: Effort normal and breath sounds normal.  GI: Soft. Bowel sounds are normal.  Musculoskeletal: Normal range of motion.  Neurological: He is alert and oriented to person, place, and time.  Skin: Skin is warm and dry.    Assessment & Plan:   Please see Problem List for Assessment and Plan.

## 2015-05-03 NOTE — Assessment & Plan Note (Signed)
Referral for colonoscopy as patient needs this prior to proceeding with kidney transplant.

## 2015-05-03 NOTE — Assessment & Plan Note (Signed)
Assessment: Continues to have post-HD headaches described as bilateral, lasting for approximately 1 day after HD sessions.  Radiates to face and neck.  Verapamil did not help he states and BP remains elevated.  Plan: -stop verapamil, restart amlodipine -continue Tylenol as needed -recommended supportive treatment to help with possible tension-type headache symptoms (stretching, relaxation) -hopefully headaches will subside as patient gets accustomed to HD as he has only been receiving for a few months

## 2015-05-03 NOTE — Assessment & Plan Note (Signed)
Assessment: BP elevated today although patient did not take his BP meds today.  Last visit, amlodipine was stopped and verapamil added to try helping with his post-HD headaches.  Has not responded to verapamil for headaches and BP remains uncontrolled.  Plan: -discontinue Verapamil, restart Amlodipine 5mg  daily. -continue other medications: carvedilol 25mg  BID, Cardura 8mg  daily, Lasix 40mg  daily on non-HD days per nephrology. -reminded to consistently take BP meds daily as prescribed -RTC in 1-2 months for BP check

## 2015-05-03 NOTE — Patient Instructions (Signed)
High Blood Pressure Your BP today is 161/88.  It is not well-controlled. Your goal is to have a BP average < 140/90. Medicine Changes: STOP taking Verapamil at bedtime.  START taking Amlodipine (Norvasc) 5mg  at bedtime. Homework: Continue to exercise regularly and eat a well-balanced diet, especially monitoring your salt intake.  Monitor your blood pressure at home 3 times per week or occasionally at the drug store. Call us if you need anything   Come back to see [place] in: 12 weeks  Please try to bring all your medicines next time. This helps [place] take good care of you and stops mistakes from medicines that could hurt you.

## 2015-05-04 NOTE — Addendum Note (Signed)
Addended by: Lalla Brothers T on: 05/04/2015 10:33 AM   Modules accepted: Level of Service

## 2015-05-04 NOTE — Progress Notes (Signed)
Internal Medicine Clinic Attending  I saw and evaluated the patient.  I personally confirmed the key portions of the history and exam documented by Dr. [name] and I reviewed pertinent patient test results.  The assessment, diagnosis, and plan were formulated together and I agree with the documentation in the resident's note.  BP is elevated today, but he recently initiated HD only four months ago. I anticipate his BP will improve as he approaches his dry weight. Switching back to amlodipine today. Headache is still associated with HD days, no features of migraine, no red flag symptoms. Likely also related to adjustment to HD. It sounds like he is moving quickly towards renal transplant at [facility] which will likely resolve this problem.

## 2015-05-05 DIAGNOSIS — N186 End stage renal disease: Secondary | ICD-10-CM | POA: Diagnosis not present

## 2015-05-05 DIAGNOSIS — D631 Anemia in chronic kidney disease: Secondary | ICD-10-CM | POA: Diagnosis not present

## 2015-05-05 DIAGNOSIS — N2581 Secondary hyperparathyroidism of renal origin: Secondary | ICD-10-CM | POA: Diagnosis not present

## 2015-05-05 DIAGNOSIS — D509 Iron deficiency anemia, unspecified: Secondary | ICD-10-CM | POA: Diagnosis not present

## 2015-05-08 DIAGNOSIS — N186 End stage renal disease: Secondary | ICD-10-CM | POA: Diagnosis not present

## 2015-05-08 DIAGNOSIS — N2581 Secondary hyperparathyroidism of renal origin: Secondary | ICD-10-CM | POA: Diagnosis not present

## 2015-05-08 DIAGNOSIS — D509 Iron deficiency anemia, unspecified: Secondary | ICD-10-CM | POA: Diagnosis not present

## 2015-05-08 DIAGNOSIS — D631 Anemia in chronic kidney disease: Secondary | ICD-10-CM | POA: Diagnosis not present

## 2015-05-10 DIAGNOSIS — N186 End stage renal disease: Secondary | ICD-10-CM | POA: Diagnosis not present

## 2015-05-10 DIAGNOSIS — D631 Anemia in chronic kidney disease: Secondary | ICD-10-CM | POA: Diagnosis not present

## 2015-05-10 DIAGNOSIS — N2581 Secondary hyperparathyroidism of renal origin: Secondary | ICD-10-CM | POA: Diagnosis not present

## 2015-05-10 DIAGNOSIS — D509 Iron deficiency anemia, unspecified: Secondary | ICD-10-CM | POA: Diagnosis not present

## 2015-05-12 DIAGNOSIS — N2581 Secondary hyperparathyroidism of renal origin: Secondary | ICD-10-CM | POA: Diagnosis not present

## 2015-05-12 DIAGNOSIS — N186 End stage renal disease: Secondary | ICD-10-CM | POA: Diagnosis not present

## 2015-05-12 DIAGNOSIS — D509 Iron deficiency anemia, unspecified: Secondary | ICD-10-CM | POA: Diagnosis not present

## 2015-05-12 DIAGNOSIS — D631 Anemia in chronic kidney disease: Secondary | ICD-10-CM | POA: Diagnosis not present

## 2015-05-15 DIAGNOSIS — N2581 Secondary hyperparathyroidism of renal origin: Secondary | ICD-10-CM | POA: Diagnosis not present

## 2015-05-15 DIAGNOSIS — N186 End stage renal disease: Secondary | ICD-10-CM | POA: Diagnosis not present

## 2015-05-15 DIAGNOSIS — D509 Iron deficiency anemia, unspecified: Secondary | ICD-10-CM | POA: Diagnosis not present

## 2015-05-15 DIAGNOSIS — D631 Anemia in chronic kidney disease: Secondary | ICD-10-CM | POA: Diagnosis not present

## 2015-05-17 DIAGNOSIS — D631 Anemia in chronic kidney disease: Secondary | ICD-10-CM | POA: Diagnosis not present

## 2015-05-17 DIAGNOSIS — N2581 Secondary hyperparathyroidism of renal origin: Secondary | ICD-10-CM | POA: Diagnosis not present

## 2015-05-17 DIAGNOSIS — N186 End stage renal disease: Secondary | ICD-10-CM | POA: Diagnosis not present

## 2015-05-17 DIAGNOSIS — D509 Iron deficiency anemia, unspecified: Secondary | ICD-10-CM | POA: Diagnosis not present

## 2015-05-19 DIAGNOSIS — N186 End stage renal disease: Secondary | ICD-10-CM | POA: Diagnosis not present

## 2015-05-19 DIAGNOSIS — D509 Iron deficiency anemia, unspecified: Secondary | ICD-10-CM | POA: Diagnosis not present

## 2015-05-19 DIAGNOSIS — N2581 Secondary hyperparathyroidism of renal origin: Secondary | ICD-10-CM | POA: Diagnosis not present

## 2015-05-19 DIAGNOSIS — D631 Anemia in chronic kidney disease: Secondary | ICD-10-CM | POA: Diagnosis not present

## 2015-05-21 DIAGNOSIS — I120 Hypertensive chronic kidney disease with stage 5 chronic kidney disease or end stage renal disease: Secondary | ICD-10-CM | POA: Diagnosis not present

## 2015-05-21 DIAGNOSIS — Z992 Dependence on renal dialysis: Secondary | ICD-10-CM | POA: Diagnosis not present

## 2015-05-21 DIAGNOSIS — N186 End stage renal disease: Secondary | ICD-10-CM | POA: Diagnosis not present

## 2015-05-22 DIAGNOSIS — D631 Anemia in chronic kidney disease: Secondary | ICD-10-CM | POA: Diagnosis not present

## 2015-05-22 DIAGNOSIS — D509 Iron deficiency anemia, unspecified: Secondary | ICD-10-CM | POA: Diagnosis not present

## 2015-05-22 DIAGNOSIS — N186 End stage renal disease: Secondary | ICD-10-CM | POA: Diagnosis not present

## 2015-05-22 DIAGNOSIS — N2581 Secondary hyperparathyroidism of renal origin: Secondary | ICD-10-CM | POA: Diagnosis not present

## 2015-05-24 DIAGNOSIS — D509 Iron deficiency anemia, unspecified: Secondary | ICD-10-CM | POA: Diagnosis not present

## 2015-05-24 DIAGNOSIS — N2581 Secondary hyperparathyroidism of renal origin: Secondary | ICD-10-CM | POA: Diagnosis not present

## 2015-05-24 DIAGNOSIS — D631 Anemia in chronic kidney disease: Secondary | ICD-10-CM | POA: Diagnosis not present

## 2015-05-24 DIAGNOSIS — N186 End stage renal disease: Secondary | ICD-10-CM | POA: Diagnosis not present

## 2015-05-26 DIAGNOSIS — D631 Anemia in chronic kidney disease: Secondary | ICD-10-CM | POA: Diagnosis not present

## 2015-05-26 DIAGNOSIS — N186 End stage renal disease: Secondary | ICD-10-CM | POA: Diagnosis not present

## 2015-05-26 DIAGNOSIS — D509 Iron deficiency anemia, unspecified: Secondary | ICD-10-CM | POA: Diagnosis not present

## 2015-05-26 DIAGNOSIS — N2581 Secondary hyperparathyroidism of renal origin: Secondary | ICD-10-CM | POA: Diagnosis not present

## 2015-05-30 DIAGNOSIS — N186 End stage renal disease: Secondary | ICD-10-CM | POA: Diagnosis not present

## 2015-05-30 DIAGNOSIS — D631 Anemia in chronic kidney disease: Secondary | ICD-10-CM | POA: Diagnosis not present

## 2015-05-30 DIAGNOSIS — D509 Iron deficiency anemia, unspecified: Secondary | ICD-10-CM | POA: Diagnosis not present

## 2015-05-30 DIAGNOSIS — N2581 Secondary hyperparathyroidism of renal origin: Secondary | ICD-10-CM | POA: Diagnosis not present

## 2015-05-31 DIAGNOSIS — N186 End stage renal disease: Secondary | ICD-10-CM | POA: Diagnosis not present

## 2015-05-31 DIAGNOSIS — D509 Iron deficiency anemia, unspecified: Secondary | ICD-10-CM | POA: Diagnosis not present

## 2015-05-31 DIAGNOSIS — N2581 Secondary hyperparathyroidism of renal origin: Secondary | ICD-10-CM | POA: Diagnosis not present

## 2015-05-31 DIAGNOSIS — D631 Anemia in chronic kidney disease: Secondary | ICD-10-CM | POA: Diagnosis not present

## 2015-06-02 DIAGNOSIS — D631 Anemia in chronic kidney disease: Secondary | ICD-10-CM | POA: Diagnosis not present

## 2015-06-02 DIAGNOSIS — N186 End stage renal disease: Secondary | ICD-10-CM | POA: Diagnosis not present

## 2015-06-02 DIAGNOSIS — N2581 Secondary hyperparathyroidism of renal origin: Secondary | ICD-10-CM | POA: Diagnosis not present

## 2015-06-02 DIAGNOSIS — D509 Iron deficiency anemia, unspecified: Secondary | ICD-10-CM | POA: Diagnosis not present

## 2015-06-04 ENCOUNTER — Encounter: Payer: Medicare Other | Admitting: Internal Medicine

## 2015-06-05 DIAGNOSIS — N186 End stage renal disease: Secondary | ICD-10-CM | POA: Diagnosis not present

## 2015-06-05 DIAGNOSIS — D509 Iron deficiency anemia, unspecified: Secondary | ICD-10-CM | POA: Diagnosis not present

## 2015-06-05 DIAGNOSIS — D631 Anemia in chronic kidney disease: Secondary | ICD-10-CM | POA: Diagnosis not present

## 2015-06-05 DIAGNOSIS — N2581 Secondary hyperparathyroidism of renal origin: Secondary | ICD-10-CM | POA: Diagnosis not present

## 2015-06-07 DIAGNOSIS — N2581 Secondary hyperparathyroidism of renal origin: Secondary | ICD-10-CM | POA: Diagnosis not present

## 2015-06-07 DIAGNOSIS — N186 End stage renal disease: Secondary | ICD-10-CM | POA: Diagnosis not present

## 2015-06-07 DIAGNOSIS — D509 Iron deficiency anemia, unspecified: Secondary | ICD-10-CM | POA: Diagnosis not present

## 2015-06-07 DIAGNOSIS — D631 Anemia in chronic kidney disease: Secondary | ICD-10-CM | POA: Diagnosis not present

## 2015-06-09 DIAGNOSIS — D509 Iron deficiency anemia, unspecified: Secondary | ICD-10-CM | POA: Diagnosis not present

## 2015-06-09 DIAGNOSIS — N2581 Secondary hyperparathyroidism of renal origin: Secondary | ICD-10-CM | POA: Diagnosis not present

## 2015-06-09 DIAGNOSIS — N186 End stage renal disease: Secondary | ICD-10-CM | POA: Diagnosis not present

## 2015-06-09 DIAGNOSIS — D631 Anemia in chronic kidney disease: Secondary | ICD-10-CM | POA: Diagnosis not present

## 2015-06-12 DIAGNOSIS — N186 End stage renal disease: Secondary | ICD-10-CM | POA: Diagnosis not present

## 2015-06-12 DIAGNOSIS — D509 Iron deficiency anemia, unspecified: Secondary | ICD-10-CM | POA: Diagnosis not present

## 2015-06-12 DIAGNOSIS — D631 Anemia in chronic kidney disease: Secondary | ICD-10-CM | POA: Diagnosis not present

## 2015-06-12 DIAGNOSIS — N2581 Secondary hyperparathyroidism of renal origin: Secondary | ICD-10-CM | POA: Diagnosis not present

## 2015-06-13 ENCOUNTER — Other Ambulatory Visit: Payer: Self-pay | Admitting: Gastroenterology

## 2015-06-13 DIAGNOSIS — D126 Benign neoplasm of colon, unspecified: Secondary | ICD-10-CM | POA: Diagnosis not present

## 2015-06-13 DIAGNOSIS — Z1211 Encounter for screening for malignant neoplasm of colon: Secondary | ICD-10-CM | POA: Diagnosis not present

## 2015-06-13 DIAGNOSIS — D122 Benign neoplasm of ascending colon: Secondary | ICD-10-CM | POA: Diagnosis not present

## 2015-06-15 DIAGNOSIS — N2581 Secondary hyperparathyroidism of renal origin: Secondary | ICD-10-CM | POA: Diagnosis not present

## 2015-06-15 DIAGNOSIS — D509 Iron deficiency anemia, unspecified: Secondary | ICD-10-CM | POA: Diagnosis not present

## 2015-06-15 DIAGNOSIS — N186 End stage renal disease: Secondary | ICD-10-CM | POA: Diagnosis not present

## 2015-06-15 DIAGNOSIS — D631 Anemia in chronic kidney disease: Secondary | ICD-10-CM | POA: Diagnosis not present

## 2015-06-17 DIAGNOSIS — N2581 Secondary hyperparathyroidism of renal origin: Secondary | ICD-10-CM | POA: Diagnosis not present

## 2015-06-17 DIAGNOSIS — N186 End stage renal disease: Secondary | ICD-10-CM | POA: Diagnosis not present

## 2015-06-17 DIAGNOSIS — D631 Anemia in chronic kidney disease: Secondary | ICD-10-CM | POA: Diagnosis not present

## 2015-06-17 DIAGNOSIS — D509 Iron deficiency anemia, unspecified: Secondary | ICD-10-CM | POA: Diagnosis not present

## 2015-06-19 DIAGNOSIS — D509 Iron deficiency anemia, unspecified: Secondary | ICD-10-CM | POA: Diagnosis not present

## 2015-06-19 DIAGNOSIS — D631 Anemia in chronic kidney disease: Secondary | ICD-10-CM | POA: Diagnosis not present

## 2015-06-19 DIAGNOSIS — N186 End stage renal disease: Secondary | ICD-10-CM | POA: Diagnosis not present

## 2015-06-19 DIAGNOSIS — N2581 Secondary hyperparathyroidism of renal origin: Secondary | ICD-10-CM | POA: Diagnosis not present

## 2015-06-20 DIAGNOSIS — I120 Hypertensive chronic kidney disease with stage 5 chronic kidney disease or end stage renal disease: Secondary | ICD-10-CM | POA: Diagnosis not present

## 2015-06-20 DIAGNOSIS — N186 End stage renal disease: Secondary | ICD-10-CM | POA: Diagnosis not present

## 2015-06-20 DIAGNOSIS — Z992 Dependence on renal dialysis: Secondary | ICD-10-CM | POA: Diagnosis not present

## 2015-06-21 DIAGNOSIS — N186 End stage renal disease: Secondary | ICD-10-CM | POA: Diagnosis not present

## 2015-06-21 DIAGNOSIS — D509 Iron deficiency anemia, unspecified: Secondary | ICD-10-CM | POA: Diagnosis not present

## 2015-06-21 DIAGNOSIS — N2581 Secondary hyperparathyroidism of renal origin: Secondary | ICD-10-CM | POA: Diagnosis not present

## 2015-06-21 DIAGNOSIS — D631 Anemia in chronic kidney disease: Secondary | ICD-10-CM | POA: Diagnosis not present

## 2015-06-22 DIAGNOSIS — D509 Iron deficiency anemia, unspecified: Secondary | ICD-10-CM | POA: Diagnosis not present

## 2015-06-22 DIAGNOSIS — N186 End stage renal disease: Secondary | ICD-10-CM | POA: Diagnosis not present

## 2015-06-22 DIAGNOSIS — N2581 Secondary hyperparathyroidism of renal origin: Secondary | ICD-10-CM | POA: Diagnosis not present

## 2015-06-22 DIAGNOSIS — D631 Anemia in chronic kidney disease: Secondary | ICD-10-CM | POA: Diagnosis not present

## 2015-06-26 DIAGNOSIS — D631 Anemia in chronic kidney disease: Secondary | ICD-10-CM | POA: Diagnosis not present

## 2015-06-26 DIAGNOSIS — N2581 Secondary hyperparathyroidism of renal origin: Secondary | ICD-10-CM | POA: Diagnosis not present

## 2015-06-26 DIAGNOSIS — N186 End stage renal disease: Secondary | ICD-10-CM | POA: Diagnosis not present

## 2015-06-26 DIAGNOSIS — D509 Iron deficiency anemia, unspecified: Secondary | ICD-10-CM | POA: Diagnosis not present

## 2015-06-28 DIAGNOSIS — N186 End stage renal disease: Secondary | ICD-10-CM | POA: Diagnosis not present

## 2015-06-28 DIAGNOSIS — D631 Anemia in chronic kidney disease: Secondary | ICD-10-CM | POA: Diagnosis not present

## 2015-06-28 DIAGNOSIS — D509 Iron deficiency anemia, unspecified: Secondary | ICD-10-CM | POA: Diagnosis not present

## 2015-06-28 DIAGNOSIS — N2581 Secondary hyperparathyroidism of renal origin: Secondary | ICD-10-CM | POA: Diagnosis not present

## 2015-06-30 DIAGNOSIS — D509 Iron deficiency anemia, unspecified: Secondary | ICD-10-CM | POA: Diagnosis not present

## 2015-06-30 DIAGNOSIS — N186 End stage renal disease: Secondary | ICD-10-CM | POA: Diagnosis not present

## 2015-06-30 DIAGNOSIS — N2581 Secondary hyperparathyroidism of renal origin: Secondary | ICD-10-CM | POA: Diagnosis not present

## 2015-06-30 DIAGNOSIS — D631 Anemia in chronic kidney disease: Secondary | ICD-10-CM | POA: Diagnosis not present

## 2015-07-03 DIAGNOSIS — N2581 Secondary hyperparathyroidism of renal origin: Secondary | ICD-10-CM | POA: Diagnosis not present

## 2015-07-03 DIAGNOSIS — D509 Iron deficiency anemia, unspecified: Secondary | ICD-10-CM | POA: Diagnosis not present

## 2015-07-03 DIAGNOSIS — N186 End stage renal disease: Secondary | ICD-10-CM | POA: Diagnosis not present

## 2015-07-03 DIAGNOSIS — D631 Anemia in chronic kidney disease: Secondary | ICD-10-CM | POA: Diagnosis not present

## 2015-07-05 DIAGNOSIS — N2581 Secondary hyperparathyroidism of renal origin: Secondary | ICD-10-CM | POA: Diagnosis not present

## 2015-07-05 DIAGNOSIS — D509 Iron deficiency anemia, unspecified: Secondary | ICD-10-CM | POA: Diagnosis not present

## 2015-07-05 DIAGNOSIS — N186 End stage renal disease: Secondary | ICD-10-CM | POA: Diagnosis not present

## 2015-07-05 DIAGNOSIS — D631 Anemia in chronic kidney disease: Secondary | ICD-10-CM | POA: Diagnosis not present

## 2015-07-06 DIAGNOSIS — D631 Anemia in chronic kidney disease: Secondary | ICD-10-CM | POA: Diagnosis not present

## 2015-07-06 DIAGNOSIS — N186 End stage renal disease: Secondary | ICD-10-CM | POA: Diagnosis not present

## 2015-07-06 DIAGNOSIS — D509 Iron deficiency anemia, unspecified: Secondary | ICD-10-CM | POA: Diagnosis not present

## 2015-07-06 DIAGNOSIS — N2581 Secondary hyperparathyroidism of renal origin: Secondary | ICD-10-CM | POA: Diagnosis not present

## 2015-07-10 DIAGNOSIS — D509 Iron deficiency anemia, unspecified: Secondary | ICD-10-CM | POA: Diagnosis not present

## 2015-07-10 DIAGNOSIS — N2581 Secondary hyperparathyroidism of renal origin: Secondary | ICD-10-CM | POA: Diagnosis not present

## 2015-07-10 DIAGNOSIS — N186 End stage renal disease: Secondary | ICD-10-CM | POA: Diagnosis not present

## 2015-07-10 DIAGNOSIS — D631 Anemia in chronic kidney disease: Secondary | ICD-10-CM | POA: Diagnosis not present

## 2015-07-12 DIAGNOSIS — N2581 Secondary hyperparathyroidism of renal origin: Secondary | ICD-10-CM | POA: Diagnosis not present

## 2015-07-12 DIAGNOSIS — D509 Iron deficiency anemia, unspecified: Secondary | ICD-10-CM | POA: Diagnosis not present

## 2015-07-12 DIAGNOSIS — D631 Anemia in chronic kidney disease: Secondary | ICD-10-CM | POA: Diagnosis not present

## 2015-07-12 DIAGNOSIS — N186 End stage renal disease: Secondary | ICD-10-CM | POA: Diagnosis not present

## 2015-07-14 DIAGNOSIS — D631 Anemia in chronic kidney disease: Secondary | ICD-10-CM | POA: Diagnosis not present

## 2015-07-14 DIAGNOSIS — D509 Iron deficiency anemia, unspecified: Secondary | ICD-10-CM | POA: Diagnosis not present

## 2015-07-14 DIAGNOSIS — N186 End stage renal disease: Secondary | ICD-10-CM | POA: Diagnosis not present

## 2015-07-14 DIAGNOSIS — N2581 Secondary hyperparathyroidism of renal origin: Secondary | ICD-10-CM | POA: Diagnosis not present

## 2015-07-17 DIAGNOSIS — N186 End stage renal disease: Secondary | ICD-10-CM | POA: Diagnosis not present

## 2015-07-17 DIAGNOSIS — D509 Iron deficiency anemia, unspecified: Secondary | ICD-10-CM | POA: Diagnosis not present

## 2015-07-17 DIAGNOSIS — D631 Anemia in chronic kidney disease: Secondary | ICD-10-CM | POA: Diagnosis not present

## 2015-07-17 DIAGNOSIS — N2581 Secondary hyperparathyroidism of renal origin: Secondary | ICD-10-CM | POA: Diagnosis not present

## 2015-07-19 DIAGNOSIS — D509 Iron deficiency anemia, unspecified: Secondary | ICD-10-CM | POA: Diagnosis not present

## 2015-07-19 DIAGNOSIS — N2581 Secondary hyperparathyroidism of renal origin: Secondary | ICD-10-CM | POA: Diagnosis not present

## 2015-07-19 DIAGNOSIS — N186 End stage renal disease: Secondary | ICD-10-CM | POA: Diagnosis not present

## 2015-07-19 DIAGNOSIS — D631 Anemia in chronic kidney disease: Secondary | ICD-10-CM | POA: Diagnosis not present

## 2015-07-21 DIAGNOSIS — I120 Hypertensive chronic kidney disease with stage 5 chronic kidney disease or end stage renal disease: Secondary | ICD-10-CM | POA: Diagnosis not present

## 2015-07-21 DIAGNOSIS — N186 End stage renal disease: Secondary | ICD-10-CM | POA: Diagnosis not present

## 2015-07-21 DIAGNOSIS — D631 Anemia in chronic kidney disease: Secondary | ICD-10-CM | POA: Diagnosis not present

## 2015-07-21 DIAGNOSIS — N2581 Secondary hyperparathyroidism of renal origin: Secondary | ICD-10-CM | POA: Diagnosis not present

## 2015-07-21 DIAGNOSIS — D509 Iron deficiency anemia, unspecified: Secondary | ICD-10-CM | POA: Diagnosis not present

## 2015-07-21 DIAGNOSIS — Z992 Dependence on renal dialysis: Secondary | ICD-10-CM | POA: Diagnosis not present

## 2015-07-24 DIAGNOSIS — N186 End stage renal disease: Secondary | ICD-10-CM | POA: Diagnosis not present

## 2015-07-24 DIAGNOSIS — N2581 Secondary hyperparathyroidism of renal origin: Secondary | ICD-10-CM | POA: Diagnosis not present

## 2015-07-24 DIAGNOSIS — D509 Iron deficiency anemia, unspecified: Secondary | ICD-10-CM | POA: Diagnosis not present

## 2015-07-24 DIAGNOSIS — D631 Anemia in chronic kidney disease: Secondary | ICD-10-CM | POA: Diagnosis not present

## 2015-07-26 DIAGNOSIS — N186 End stage renal disease: Secondary | ICD-10-CM | POA: Diagnosis not present

## 2015-07-26 DIAGNOSIS — D509 Iron deficiency anemia, unspecified: Secondary | ICD-10-CM | POA: Diagnosis not present

## 2015-07-26 DIAGNOSIS — N2581 Secondary hyperparathyroidism of renal origin: Secondary | ICD-10-CM | POA: Diagnosis not present

## 2015-07-26 DIAGNOSIS — D631 Anemia in chronic kidney disease: Secondary | ICD-10-CM | POA: Diagnosis not present

## 2015-07-28 DIAGNOSIS — D509 Iron deficiency anemia, unspecified: Secondary | ICD-10-CM | POA: Diagnosis not present

## 2015-07-28 DIAGNOSIS — D631 Anemia in chronic kidney disease: Secondary | ICD-10-CM | POA: Diagnosis not present

## 2015-07-28 DIAGNOSIS — N186 End stage renal disease: Secondary | ICD-10-CM | POA: Diagnosis not present

## 2015-07-28 DIAGNOSIS — N2581 Secondary hyperparathyroidism of renal origin: Secondary | ICD-10-CM | POA: Diagnosis not present

## 2015-07-31 DIAGNOSIS — N2581 Secondary hyperparathyroidism of renal origin: Secondary | ICD-10-CM | POA: Diagnosis not present

## 2015-07-31 DIAGNOSIS — N186 End stage renal disease: Secondary | ICD-10-CM | POA: Diagnosis not present

## 2015-07-31 DIAGNOSIS — D509 Iron deficiency anemia, unspecified: Secondary | ICD-10-CM | POA: Diagnosis not present

## 2015-07-31 DIAGNOSIS — D631 Anemia in chronic kidney disease: Secondary | ICD-10-CM | POA: Diagnosis not present

## 2015-08-02 DIAGNOSIS — N2581 Secondary hyperparathyroidism of renal origin: Secondary | ICD-10-CM | POA: Diagnosis not present

## 2015-08-02 DIAGNOSIS — D509 Iron deficiency anemia, unspecified: Secondary | ICD-10-CM | POA: Diagnosis not present

## 2015-08-02 DIAGNOSIS — N186 End stage renal disease: Secondary | ICD-10-CM | POA: Diagnosis not present

## 2015-08-02 DIAGNOSIS — D631 Anemia in chronic kidney disease: Secondary | ICD-10-CM | POA: Diagnosis not present

## 2015-08-04 DIAGNOSIS — N186 End stage renal disease: Secondary | ICD-10-CM | POA: Diagnosis not present

## 2015-08-04 DIAGNOSIS — D631 Anemia in chronic kidney disease: Secondary | ICD-10-CM | POA: Diagnosis not present

## 2015-08-04 DIAGNOSIS — D509 Iron deficiency anemia, unspecified: Secondary | ICD-10-CM | POA: Diagnosis not present

## 2015-08-04 DIAGNOSIS — N2581 Secondary hyperparathyroidism of renal origin: Secondary | ICD-10-CM | POA: Diagnosis not present

## 2015-08-07 DIAGNOSIS — D509 Iron deficiency anemia, unspecified: Secondary | ICD-10-CM | POA: Diagnosis not present

## 2015-08-07 DIAGNOSIS — D631 Anemia in chronic kidney disease: Secondary | ICD-10-CM | POA: Diagnosis not present

## 2015-08-07 DIAGNOSIS — N2581 Secondary hyperparathyroidism of renal origin: Secondary | ICD-10-CM | POA: Diagnosis not present

## 2015-08-07 DIAGNOSIS — N186 End stage renal disease: Secondary | ICD-10-CM | POA: Diagnosis not present

## 2015-08-09 ENCOUNTER — Encounter: Payer: Self-pay | Admitting: Internal Medicine

## 2015-08-09 ENCOUNTER — Ambulatory Visit (INDEPENDENT_AMBULATORY_CARE_PROVIDER_SITE_OTHER): Payer: Medicare Other | Admitting: Internal Medicine

## 2015-08-09 VITALS — BP 163/83 | HR 78 | Temp 98.5°F | Wt 240.9 lb

## 2015-08-09 DIAGNOSIS — F17210 Nicotine dependence, cigarettes, uncomplicated: Secondary | ICD-10-CM

## 2015-08-09 DIAGNOSIS — N186 End stage renal disease: Secondary | ICD-10-CM | POA: Diagnosis not present

## 2015-08-09 DIAGNOSIS — Z992 Dependence on renal dialysis: Secondary | ICD-10-CM

## 2015-08-09 DIAGNOSIS — R21 Rash and other nonspecific skin eruption: Secondary | ICD-10-CM | POA: Diagnosis not present

## 2015-08-09 DIAGNOSIS — I10 Essential (primary) hypertension: Secondary | ICD-10-CM | POA: Diagnosis not present

## 2015-08-09 DIAGNOSIS — D631 Anemia in chronic kidney disease: Secondary | ICD-10-CM | POA: Diagnosis not present

## 2015-08-09 DIAGNOSIS — D509 Iron deficiency anemia, unspecified: Secondary | ICD-10-CM | POA: Diagnosis not present

## 2015-08-09 DIAGNOSIS — Z0000 Encounter for general adult medical examination without abnormal findings: Secondary | ICD-10-CM

## 2015-08-09 DIAGNOSIS — N2581 Secondary hyperparathyroidism of renal origin: Secondary | ICD-10-CM | POA: Diagnosis not present

## 2015-08-09 MED ORDER — AMLODIPINE BESYLATE 5 MG PO TABS: 10.0000 mg | ORAL_TABLET | Freq: Every day | ORAL | Status: DC

## 2015-08-09 MED ORDER — TRIAMCINOLONE ACETONIDE 0.1 % EX CREA: 1.0000 "application " | TOPICAL_CREAM | Freq: Two times a day (BID) | CUTANEOUS | Status: DC

## 2015-08-09 NOTE — Assessment & Plan Note (Addendum)
Assessment: BP elevated today still at 163/83.  Patient reports that his nephrologist at dialysis center increased amlodipine to 10mg  daily.  States that his BP is usually more controlled after HD session when it is in the [id number] systolic range.  States his post-HD headaches are improved but still feels fatigued after dialysis.  Plan: - continue current meds: Amlodipine 10mg  daily, carvedilol 25mg  BID, Cardura 8mg  daily, Lasix 40mg  daily on non-HD days per nephrology. - if BP continues to be elevated, will need to consider addition of another agent

## 2015-08-09 NOTE — Assessment & Plan Note (Signed)
Assessment/Plan: Patient had colonoscopy performed in Nov 2016 with Dr. Penelope Coop.  Review of report from their office revealed benign adenoma with repeat colonoscopy in 5 years.  - colonoscopy in 5 years

## 2015-08-09 NOTE — Progress Notes (Signed)
Patient ID: Darrell Johnson, male   DOB: 1966-03-04, 50 y.o.   MRN: [id number]   Subjective:   Patient ID: Darrell Johnson male   DOB: October 23, 1965 50 y.o.   MRN: [id number]  HPI: [name] is a 50 y.o. with past medical history as detailed below who presents to clinic today for follow up of hypertension and a maculopapular rash.  Please see A&P for status of patients medical conditions addressed at today's visit.    Past Medical History  Diagnosis Date  . 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 Hypertension   . Seizures ([place])     last seizure was >5 years ago, pt has family history of seizures  . Syphilis 1997    history of syphilis 1997  . Chronic kidney disease     stage 3-4 CKD, followed by Dr. Moshe Cipro  . 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 Rib fractures 01/2009  . HIV infection ([place]) 1980's    on ART therapy since, followed by ID clinic, complicated  by neuropathy  . 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 Pneumonia   . Arthritis   . Anemia   . HYPERTENSION 05/08/2006  . Gout, unspecified 08/13/2009    Qualifier: Diagnosis of  By: Redmond Pulling  MD, Mateo Flow    . CHF (congestive heart failure) ([place])    Current Outpatient Prescriptions  Medication Sig Dispense Refill  . amLODipine (NORVASC) 5 MG tablet Take 2 tablets (10 mg total) by mouth daily. 30 tablet 2  . calcitRIOL (ROCALTROL) 0.5 MCG capsule Take 0.5 mcg by mouth daily. Given at dialysis    . carvedilol (COREG) 25 MG tablet Take 1 tablet (25 mg total) by mouth 2 (two) times daily with a meal. 60 tablet 2  . doxazosin (CARDURA) 8 MG tablet TAKE 1 TABLET (8 MG TOTAL) BY MOUTH AT BEDTIME. 30 tablet 5  . Febuxostat (ULORIC) 80 MG TABS Take 80 mg by mouth daily.    . furosemide (LASIX) 40 MG tablet Take 1 tablet (40 mg total) by  mouth daily. 30 tablet 2  . HYDROcodone-acetaminophen (NORCO) 10-325 MG per tablet Take 1 tablet by mouth every 8 (eight) hours as needed. 30 tablet 0  . hydrocortisone (ANUSOL-HC) 2.5 % rectal cream Apply rectally 2 times daily 28.35 g 0  . ISENTRESS 400 MG tablet TAKE 1 TABLET BY MOUTH TWICE A DAY 60 tablet 8  . PREZISTA 800 MG tablet TAKE 1 TABLET (800 MG TOTAL) BY MOUTH DAILY WITH BREAKFAST. 30 tablet 5  . RENVELA 800 MG tablet Take 800 mg by mouth 3 (three) times daily with meals.     . ritonavir (NORVIR) 100 MG TABS tablet TAKE 1 TABLET (100MG  TOTAL) BY MOUTH DAILY 30 tablet 5  . sodium bicarbonate 650 MG tablet Take 650 mg by mouth 2 (two) times daily.  6  . tenofovir (VIREAD) 300 MG tablet Take 1 tablet (300 mg total) by mouth once a week. 12 tablet 5  . triamcinolone cream (KENALOG) 0.1 % Apply 1 application topically 2 (two) times daily. 28.4 g 0   No current facility-administered medications for this visit.   Family History  Problem Relation Age of Onset  . Cancer Mother   . COPD Father   . Diabetes Sister   . Hypertension Sister   .  Diabetes Brother   . Hypertension Brother   . Stroke Neg Hx    Social History   Social History  . Marital Status: Married    Spouse Name: N/A  . Number of Children: N/A  . Years of Education: N/A   Social History Main Topics  . Smoking status: Current Every Day Smoker -- 0.25 packs/day for 20 years    Types: Cigarettes    Last Attempt to Quit: 09/26/2014  . Smokeless tobacco: Never Used     Comment: Trying to quit.  . Alcohol Use: No  . Drug Use: No  . Sexual Activity: Not on file     Comment: given condoms   Other Topics Concern  . Not on file   Social History Narrative   Review of Systems: Review of Systems  Constitutional: Positive for malaise/fatigue. Negative for fever and chills.  Respiratory: Negative for cough and shortness of breath.   Cardiovascular: Negative for chest pain and leg swelling.  Gastrointestinal:  Negative for nausea and vomiting.  Musculoskeletal: Negative for falls.  Skin: Positive for rash.  Neurological: Negative for headaches.    Objective:  Physical Exam: Filed Vitals:   08/09/15 1527  BP: 163/83  Pulse: 78  Temp: 98.5 F (36.9 C)  TempSrc: Oral  Weight: 240 lb 14.4 oz (109.272 kg)  SpO2: 99%   Physical Exam  Constitutional: He is oriented to person, place, and time. He appears well-developed and well-nourished.  Tired appearing  HENT:  Head: Normocephalic and atraumatic.  Neck: Normal range of motion.  Cardiovascular: Normal rate and regular rhythm.   Pulmonary/Chest: Effort normal and breath sounds normal.  Neurological: He is alert and oriented to person, place, and time.  Skin:  Diffuse maculopapular rash over much of upper back and chest.  Does not look infectious and appears more inflammatory.  No drainage from papular lesions      Assessment & Plan:   Please see problem list for assessment and plan.  Case discussed with Dr. Evette Doffing.

## 2015-08-09 NOTE — Assessment & Plan Note (Signed)
Assessment: Patient reports a diffuse, maculopapular rash over much of his upper back and chest that began when he started on dialysis.  Was told to try topical benadryl which reportedly has not helped.  He states the rash is not painful except when papules being popped by his wife and otherwise does not drain any fluid or pus.  States they are pruritic.  Plan: - triamcinolone 0.1% cream BID - dermatology referral

## 2015-08-10 NOTE — Progress Notes (Signed)
Internal Medicine Clinic Attending  I saw and evaluated the patient.  I personally confirmed the key portions of the history and exam documented by Dr. Wallace and I reviewed pertinent patient test results.  The assessment, diagnosis, and plan were formulated together and I agree with the documentation in the resident's note. 

## 2015-08-11 DIAGNOSIS — D631 Anemia in chronic kidney disease: Secondary | ICD-10-CM | POA: Diagnosis not present

## 2015-08-11 DIAGNOSIS — D509 Iron deficiency anemia, unspecified: Secondary | ICD-10-CM | POA: Diagnosis not present

## 2015-08-11 DIAGNOSIS — N186 End stage renal disease: Secondary | ICD-10-CM | POA: Diagnosis not present

## 2015-08-11 DIAGNOSIS — N2581 Secondary hyperparathyroidism of renal origin: Secondary | ICD-10-CM | POA: Diagnosis not present

## 2015-08-14 DIAGNOSIS — D631 Anemia in chronic kidney disease: Secondary | ICD-10-CM | POA: Diagnosis not present

## 2015-08-14 DIAGNOSIS — D509 Iron deficiency anemia, unspecified: Secondary | ICD-10-CM | POA: Diagnosis not present

## 2015-08-14 DIAGNOSIS — N2581 Secondary hyperparathyroidism of renal origin: Secondary | ICD-10-CM | POA: Diagnosis not present

## 2015-08-14 DIAGNOSIS — N186 End stage renal disease: Secondary | ICD-10-CM | POA: Diagnosis not present

## 2015-08-16 DIAGNOSIS — N186 End stage renal disease: Secondary | ICD-10-CM | POA: Diagnosis not present

## 2015-08-16 DIAGNOSIS — D631 Anemia in chronic kidney disease: Secondary | ICD-10-CM | POA: Diagnosis not present

## 2015-08-16 DIAGNOSIS — D509 Iron deficiency anemia, unspecified: Secondary | ICD-10-CM | POA: Diagnosis not present

## 2015-08-16 DIAGNOSIS — N2581 Secondary hyperparathyroidism of renal origin: Secondary | ICD-10-CM | POA: Diagnosis not present

## 2015-08-18 DIAGNOSIS — N186 End stage renal disease: Secondary | ICD-10-CM | POA: Diagnosis not present

## 2015-08-18 DIAGNOSIS — D509 Iron deficiency anemia, unspecified: Secondary | ICD-10-CM | POA: Diagnosis not present

## 2015-08-18 DIAGNOSIS — N2581 Secondary hyperparathyroidism of renal origin: Secondary | ICD-10-CM | POA: Diagnosis not present

## 2015-08-18 DIAGNOSIS — D631 Anemia in chronic kidney disease: Secondary | ICD-10-CM | POA: Diagnosis not present

## 2015-08-21 DIAGNOSIS — N186 End stage renal disease: Secondary | ICD-10-CM | POA: Diagnosis not present

## 2015-08-21 DIAGNOSIS — D631 Anemia in chronic kidney disease: Secondary | ICD-10-CM | POA: Diagnosis not present

## 2015-08-21 DIAGNOSIS — N2581 Secondary hyperparathyroidism of renal origin: Secondary | ICD-10-CM | POA: Diagnosis not present

## 2015-08-21 DIAGNOSIS — D509 Iron deficiency anemia, unspecified: Secondary | ICD-10-CM | POA: Diagnosis not present

## 2015-08-21 DIAGNOSIS — Z992 Dependence on renal dialysis: Secondary | ICD-10-CM | POA: Diagnosis not present

## 2015-08-21 DIAGNOSIS — I120 Hypertensive chronic kidney disease with stage 5 chronic kidney disease or end stage renal disease: Secondary | ICD-10-CM | POA: Diagnosis not present

## 2015-08-23 DIAGNOSIS — N2581 Secondary hyperparathyroidism of renal origin: Secondary | ICD-10-CM | POA: Diagnosis not present

## 2015-08-23 DIAGNOSIS — D631 Anemia in chronic kidney disease: Secondary | ICD-10-CM | POA: Diagnosis not present

## 2015-08-23 DIAGNOSIS — D509 Iron deficiency anemia, unspecified: Secondary | ICD-10-CM | POA: Diagnosis not present

## 2015-08-23 DIAGNOSIS — N186 End stage renal disease: Secondary | ICD-10-CM | POA: Diagnosis not present

## 2015-08-25 DIAGNOSIS — N2581 Secondary hyperparathyroidism of renal origin: Secondary | ICD-10-CM | POA: Diagnosis not present

## 2015-08-25 DIAGNOSIS — D631 Anemia in chronic kidney disease: Secondary | ICD-10-CM | POA: Diagnosis not present

## 2015-08-25 DIAGNOSIS — D509 Iron deficiency anemia, unspecified: Secondary | ICD-10-CM | POA: Diagnosis not present

## 2015-08-25 DIAGNOSIS — N186 End stage renal disease: Secondary | ICD-10-CM | POA: Diagnosis not present

## 2015-08-28 DIAGNOSIS — D631 Anemia in chronic kidney disease: Secondary | ICD-10-CM | POA: Diagnosis not present

## 2015-08-28 DIAGNOSIS — N2581 Secondary hyperparathyroidism of renal origin: Secondary | ICD-10-CM | POA: Diagnosis not present

## 2015-08-28 DIAGNOSIS — D509 Iron deficiency anemia, unspecified: Secondary | ICD-10-CM | POA: Diagnosis not present

## 2015-08-28 DIAGNOSIS — N186 End stage renal disease: Secondary | ICD-10-CM | POA: Diagnosis not present

## 2015-08-30 DIAGNOSIS — D631 Anemia in chronic kidney disease: Secondary | ICD-10-CM | POA: Diagnosis not present

## 2015-08-30 DIAGNOSIS — D509 Iron deficiency anemia, unspecified: Secondary | ICD-10-CM | POA: Diagnosis not present

## 2015-08-30 DIAGNOSIS — N186 End stage renal disease: Secondary | ICD-10-CM | POA: Diagnosis not present

## 2015-08-30 DIAGNOSIS — N2581 Secondary hyperparathyroidism of renal origin: Secondary | ICD-10-CM | POA: Diagnosis not present

## 2015-09-01 DIAGNOSIS — D631 Anemia in chronic kidney disease: Secondary | ICD-10-CM | POA: Diagnosis not present

## 2015-09-01 DIAGNOSIS — D509 Iron deficiency anemia, unspecified: Secondary | ICD-10-CM | POA: Diagnosis not present

## 2015-09-01 DIAGNOSIS — N2581 Secondary hyperparathyroidism of renal origin: Secondary | ICD-10-CM | POA: Diagnosis not present

## 2015-09-01 DIAGNOSIS — N186 End stage renal disease: Secondary | ICD-10-CM | POA: Diagnosis not present

## 2015-09-02 ENCOUNTER — Emergency Department (HOSPITAL_COMMUNITY)
Admission: EM | Admit: 2015-09-02 | Discharge: 2015-09-02 | Disposition: A | Discharge status: Home / Self Care | Payer: Medicare Other | Attending: Emergency Medicine | Admitting: Emergency Medicine

## 2015-09-02 ENCOUNTER — Encounter (HOSPITAL_COMMUNITY): Payer: Self-pay | Admitting: *Deleted

## 2015-09-02 DIAGNOSIS — N186 End stage renal disease: Secondary | ICD-10-CM | POA: Diagnosis not present

## 2015-09-02 DIAGNOSIS — Z79899 Other long term (current) drug therapy: Secondary | ICD-10-CM | POA: Insufficient documentation

## 2015-09-02 DIAGNOSIS — Z77098 Contact with and (suspected) exposure to other hazardous, chiefly nonmedicinal, chemicals: Secondary | ICD-10-CM | POA: Insufficient documentation

## 2015-09-02 DIAGNOSIS — B20 Human immunodeficiency virus [HIV] disease: Secondary | ICD-10-CM | POA: Insufficient documentation

## 2015-09-02 DIAGNOSIS — Z8781 Personal history of (healed) traumatic fracture: Secondary | ICD-10-CM | POA: Insufficient documentation

## 2015-09-02 DIAGNOSIS — I509 Heart failure, unspecified: Secondary | ICD-10-CM | POA: Diagnosis not present

## 2015-09-02 DIAGNOSIS — Z992 Dependence on renal dialysis: Secondary | ICD-10-CM | POA: Insufficient documentation

## 2015-09-02 DIAGNOSIS — H578 Other specified disorders of eye and adnexa: Secondary | ICD-10-CM | POA: Diagnosis present

## 2015-09-02 DIAGNOSIS — Z8639 Personal history of other endocrine, nutritional and metabolic disease: Secondary | ICD-10-CM | POA: Diagnosis not present

## 2015-09-02 DIAGNOSIS — Z7952 Long term (current) use of systemic steroids: Secondary | ICD-10-CM | POA: Insufficient documentation

## 2015-09-02 DIAGNOSIS — M109 Gout, unspecified: Secondary | ICD-10-CM | POA: Diagnosis not present

## 2015-09-02 DIAGNOSIS — F17210 Nicotine dependence, cigarettes, uncomplicated: Secondary | ICD-10-CM | POA: Insufficient documentation

## 2015-09-02 DIAGNOSIS — I120 Hypertensive chronic kidney disease with stage 5 chronic kidney disease or end stage renal disease: Secondary | ICD-10-CM | POA: Insufficient documentation

## 2015-09-02 DIAGNOSIS — Z8701 Personal history of pneumonia (recurrent): Secondary | ICD-10-CM | POA: Insufficient documentation

## 2015-09-02 MED ORDER — ERYTHROMYCIN 5 MG/GM OP OINT: TOPICAL_OINTMENT | OPHTHALMIC | Status: DC

## 2015-09-02 NOTE — ED Notes (Signed)
Pt presents tonight after he sprayed eye glass cleanser in  [facility].

## 2015-09-02 NOTE — ED Provider Notes (Signed)
CSN: [id number]     Arrival date & time 09/02/15  1813 History  By signing my name below, I, Randa Evens, attest that this documentation has been prepared under the direction and in the presence of Ralston Lions, PA-C. Electronically Signed: Randa Evens, ED Scribe. 09/02/2015. 6:33 PM.    Chief Complaint  Patient presents with  . Eye Problem   The history is provided by the patient. No language interpreter was used.   HPI Comments: Darrell Johnson is a 50 y.o. male who presents to the Emergency Department complaining of right eye injury onset today 2 hours PTA. Pt states that cleaning spray for his glasses got fell into his eye when he was placing his glasses back on. He states that he is having associated blurred vision out of the right eye. Pt states that he has tried washing the eye out with water with no relief. Pt states that he has also tried clear eyes eye drops with no relief. Pt denies eye pain, eye redness or loss of vision.   Info on bottle: [employer], sport and exercise. [id number]  Past Medical History  Diagnosis Date  . 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 Hypertension   . Seizures ([place])     last seizure was >5 years ago, pt has family history of seizures  . Syphilis 1997    history of syphilis 1997  . Chronic kidney disease     stage 3-4 CKD, followed by Dr. Moshe Cipro  . 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 Rib fractures 01/2009  . HIV infection ([place]) 1980's    on ART therapy since, followed by ID clinic, complicated  by neuropathy  . 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 Pneumonia   . Arthritis   . Anemia   . HYPERTENSION 05/08/2006  . Gout, unspecified 08/13/2009    Qualifier: Diagnosis of  By: Redmond Pulling  MD, Mateo Flow    . CHF (congestive heart failure) [facility])     Past Surgical History  Procedure Laterality Date  . Abscess drainage    . Av fistula placement Right 12/02/2013    Procedure: ARTERIOVENOUS (AV) FISTULA CREATION;  Surgeon: Rosetta Posner, MD;  Location: [facility] OR;  Service: Vascular;  Laterality: Right;   Family History  Problem Relation Age of Onset  . Cancer Mother   . COPD Father   . Diabetes Sister   . Hypertension Sister   . Diabetes Brother   . Hypertension Brother   . Stroke Neg Hx    Social History  Substance Use Topics  . Smoking status: Current Every Day Smoker -- 0.25 packs/day for 20 years    Types: Cigarettes    Last Attempt to Quit: 09/26/2014  . Smokeless tobacco: Never Used     Comment: Trying to quit.  . Alcohol Use: No    Review of Systems  Eyes: Positive for visual disturbance. Negative for pain, discharge, redness and itching.   10 Systems reviewed and all are negative for acute change except as noted in the HPI.  Allergies  Review of patient's allergies indicates no known allergies.  Home Medications   Prior to Admission medications   Medication Sig Start Date End Date Taking? Authorizing Provider  amLODipine (NORVASC) 5 MG tablet Take 2 tablets (10 mg total) by mouth  daily. 08/09/15 08/08/16  Jule Ser, DO  calcitRIOL (ROCALTROL) 0.5 MCG capsule Take 0.5 mcg by mouth daily. Given at dialysis    Historical Provider, MD  carvedilol (COREG) 25 MG tablet Take 1 tablet (25 mg total) by mouth 2 (two) times daily with a meal. 05/03/15   Jule Ser, DO  doxazosin (CARDURA) 8 MG tablet TAKE 1 TABLET (8 MG TOTAL) BY MOUTH AT BEDTIME. 01/03/15   Tasrif Ahmed, MD  Febuxostat (ULORIC) 80 MG TABS Take 80 mg by mouth daily.    Historical Provider, MD  furosemide (LASIX) 40 MG tablet Take 1 tablet (40 mg total) by mouth daily. 05/03/15   Jule Ser, DO  HYDROcodone-acetaminophen (NORCO) 10-325 MG per tablet Take 1 tablet by mouth every 8 (eight) hours as needed. 11/30/14   Tasrif Ahmed, MD  hydrocortisone  (ANUSOL-HC) 2.5 % rectal cream Apply rectally 2 times daily 11/30/14   Tasrif Ahmed, MD  ISENTRESS 400 MG tablet TAKE 1 TABLET BY MOUTH TWICE A DAY 12/12/14   Carlyle Basques, MD  PREZISTA 800 MG tablet TAKE 1 TABLET (800 MG TOTAL) BY MOUTH DAILY WITH BREAKFAST. 02/05/15   Carlyle Basques, MD  RENVELA 800 MG tablet Take 800 mg by mouth 3 (three) times daily with meals.  11/21/14   Historical Provider, MD  ritonavir (NORVIR) 100 MG TABS tablet TAKE 1 TABLET (100MG  TOTAL) BY MOUTH DAILY 02/05/15   Carlyle Basques, MD  sodium bicarbonate 650 MG tablet Take 650 mg by mouth 2 (two) times daily. 10/16/14   Historical Provider, MD  tenofovir (VIREAD) 300 MG tablet Take 1 tablet (300 mg total) by mouth once a week. 02/05/15   Carlyle Basques, MD  triamcinolone cream (KENALOG) 0.1 % Apply 1 application topically 2 (two) times daily. 08/09/15   Jule Ser, DO   BP 161/76 mmHg  Pulse 83  Resp 16  Ht 5\' 10"  (1.778 m)  Wt 110.224 kg  BMI 34.87 kg/m2  SpO2 97%   Physical Exam  Constitutional: He is oriented to person, place, and time. He appears well-developed and well-nourished. No distress.  HENT:  Head: Normocephalic and atraumatic.  Eyes: Conjunctivae and EOM are normal. Pupils are equal, round, and reactive to light. Right eye exhibits no discharge. Left eye exhibits no discharge. No scleral icterus.  No conjunctival injection, surrounding erythema.  Neck: Neck supple. No tracheal deviation present.  Cardiovascular: Normal rate.   Pulmonary/Chest: Effort normal. No respiratory distress.  Musculoskeletal: Normal range of motion.  Neurological: He is alert and oriented to person, place, and time.  Skin: Skin is warm and dry.  Psychiatric: He has a normal mood and affect. His behavior is normal.  Nursing note and vitals reviewed.   ED Course  Procedures  DIAGNOSTIC STUDIES: Oxygen Saturation is 97% on RA, normal by my interpretation.    COORDINATION OF CARE: 6:33 PM-Discussed treatment plan with pt  at bedside and pt agreed to plan.   MDM   Final diagnoses:  Chemical exposure   Patient non-toxic appearing and VSS. Flushed eye with 1 liter NS, followed by "soak" in saline while I contacted poison control. MSDS obtained, and paperwork states eye irritation should be the only side effect, as chemicals appear to be benign. I contacted poison control, and they agreed. Discussed case with Dr. Vanita Panda- Will send home with erythromycin and have patient followup with ophthalmology.  Patient feels improved after  treatment in ED.  Patient may be safely discharged home. Discussed reasons for return. Patient to follow-up with  primary care provider within one week. Patient in understanding and agreement with the plan.      [place] Lions, PA-C 09/05/15 [id number]  Carmin Muskrat, MD 09/05/15 [phone]

## 2015-09-02 NOTE — Discharge Instructions (Signed)
[name],  Nice meeting you! Please follow-up with ophthalmology. Return to the emergency department if your vision worsens, you develop headache, slurred speech, weakness in one area of your body. Feel better soon!  S. Wendie Simmer, PA-C

## 2015-09-03 DIAGNOSIS — T82858D Stenosis of vascular prosthetic devices, implants and grafts, subsequent encounter: Secondary | ICD-10-CM | POA: Diagnosis not present

## 2015-09-03 DIAGNOSIS — Z992 Dependence on renal dialysis: Secondary | ICD-10-CM | POA: Diagnosis not present

## 2015-09-03 DIAGNOSIS — I871 Compression of vein: Secondary | ICD-10-CM | POA: Diagnosis not present

## 2015-09-04 DIAGNOSIS — N186 End stage renal disease: Secondary | ICD-10-CM | POA: Diagnosis not present

## 2015-09-04 DIAGNOSIS — N2581 Secondary hyperparathyroidism of renal origin: Secondary | ICD-10-CM | POA: Diagnosis not present

## 2015-09-04 DIAGNOSIS — D631 Anemia in chronic kidney disease: Secondary | ICD-10-CM | POA: Diagnosis not present

## 2015-09-04 DIAGNOSIS — D509 Iron deficiency anemia, unspecified: Secondary | ICD-10-CM | POA: Diagnosis not present

## 2015-09-05 ENCOUNTER — Ambulatory Visit (INDEPENDENT_AMBULATORY_CARE_PROVIDER_SITE_OTHER): Payer: Medicare Other | Admitting: Internal Medicine

## 2015-09-05 VITALS — BP 147/87 | HR 79 | Temp 98.4°F | Ht 70.0 in | Wt 244.4 lb

## 2015-09-05 DIAGNOSIS — T65891D Toxic effect of other specified substances, accidental (unintentional), subsequent encounter: Secondary | ICD-10-CM

## 2015-09-05 DIAGNOSIS — F17210 Nicotine dependence, cigarettes, uncomplicated: Secondary | ICD-10-CM | POA: Diagnosis not present

## 2015-09-05 DIAGNOSIS — Z21 Asymptomatic human immunodeficiency virus [HIV] infection status: Secondary | ICD-10-CM | POA: Diagnosis not present

## 2015-09-05 DIAGNOSIS — T2690XA Corrosion of unspecified eye and adnexa, part unspecified, initial encounter: Secondary | ICD-10-CM | POA: Insufficient documentation

## 2015-09-05 DIAGNOSIS — T2691XA Corrosion of right eye and adnexa, part unspecified, initial encounter: Secondary | ICD-10-CM

## 2015-09-05 DIAGNOSIS — B20 Human immunodeficiency virus [HIV] disease: Secondary | ICD-10-CM

## 2015-09-05 DIAGNOSIS — H11421 Conjunctival edema, right eye: Secondary | ICD-10-CM

## 2015-09-05 DIAGNOSIS — T2691XD Corrosion of right eye and adnexa, part unspecified, subsequent encounter: Secondary | ICD-10-CM

## 2015-09-05 NOTE — Progress Notes (Signed)
[facility] Subjective:   Patient ID: Darrell Johnson male   DOB: 1965/11/04 50 y.o.   MRN: [id number]  HPI: Darrell Johnson is a 50 y.o. male with a PMH detailed below who presents for ED follow up.  He was seen in the ED 2 days ago after getting eyeglass cleaning solution in his right eye.  This was irrigated in the ED with normal saline and he was prescribed erythromycin ointment which he has been using.  He continues to report eye irritation, blurry vision (streaking of lights) in the right eye.  He reports that he has a "hole" in his left eye that an eye doctor many years ago and was told that it would be fine as long as nothing happened to his right eye.  This has him very anxious about his vision.  Please see problem based charting below for the status of his chronic medical problems.  Past Medical History  Diagnosis Date  . 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 Hypertension   . Seizures ([place])     last seizure was >5 years ago, pt has family history of seizures  . Syphilis 1997    history of syphilis 1997  . Chronic kidney disease     stage 3-4 CKD, followed by Dr. Moshe Cipro  . 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 Rib fractures 01/2009  . HIV infection ([place]) 1980's    on ART therapy since, followed by ID clinic, complicated  by neuropathy  . 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 Pneumonia   . Arthritis   . Anemia   . HYPERTENSION 05/08/2006  . Gout, unspecified 08/13/2009    Qualifier: Diagnosis of  By: Redmond Pulling  MD, Mateo Flow    . CHF (congestive heart failure) ([place])    Current Outpatient Prescriptions  Medication Sig Dispense Refill  . amLODipine (NORVASC) 5 MG tablet Take 2 tablets (10 mg total) by mouth daily. 30 tablet 2  . calcitRIOL (ROCALTROL) 0.5 MCG  capsule Take 0.5 mcg by mouth daily. Given at dialysis    . carvedilol (COREG) 25 MG tablet Take 1 tablet (25 mg total) by mouth 2 (two) times daily with a meal. 60 tablet 2  . doxazosin (CARDURA) 8 MG tablet TAKE 1 TABLET (8 MG TOTAL) BY MOUTH AT BEDTIME. 30 tablet 5  . erythromycin ophthalmic ointment Place a 1/2 inch ribbon of ointment into the lower eyelid. 1 g 0  . Febuxostat (ULORIC) 80 MG TABS Take 80 mg by mouth daily.    . furosemide (LASIX) 40 MG tablet Take 1 tablet (40 mg total) by mouth daily. 30 tablet 2  . HYDROcodone-acetaminophen (NORCO) 10-325 MG per tablet Take 1 tablet by mouth every 8 (eight) hours as needed. 30 tablet 0  . hydrocortisone (ANUSOL-HC) 2.5 % rectal cream Apply rectally 2 times daily 28.35 g 0  . ISENTRESS 400 MG tablet TAKE 1 TABLET BY MOUTH TWICE A DAY 60 tablet 8  . PREZISTA 800 MG tablet TAKE 1 TABLET (800 MG TOTAL) BY MOUTH DAILY WITH BREAKFAST. 30 tablet 5  . RENVELA 800 MG tablet Take 800 mg by mouth 3 (three) times daily with meals.     . ritonavir (NORVIR) 100 MG TABS tablet TAKE 1 TABLET (100MG  TOTAL) BY MOUTH DAILY 30  tablet 5  . sodium bicarbonate 650 MG tablet Take 650 mg by mouth 2 (two) times daily.  6  . tenofovir (VIREAD) 300 MG tablet Take 1 tablet (300 mg total) by mouth once a week. 12 tablet 5  . triamcinolone cream (KENALOG) 0.1 % Apply 1 application topically 2 (two) times daily. 28.4 g 0   No current facility-administered medications for this visit.   Family History  Problem Relation Age of Onset  . Cancer Mother   . COPD Father   . Diabetes Sister   . Hypertension Sister   . Diabetes Brother   . Hypertension Brother   . Stroke Neg Hx    Social History   Social History  . Marital Status: Married    Spouse Name: N/A  . Number of Children: N/A  . Years of Education: N/A   Social History Main Topics  . Smoking status: Current Every Day Smoker -- 0.25 packs/day for 20 years    Types: Cigarettes    Last Attempt to Quit:  09/26/2014  . Smokeless tobacco: Never Used     Comment: Trying to quit.  . Alcohol Use: No  . Drug Use: No  . Sexual Activity: Not on file     Comment: given condoms   Other Topics Concern  . Not on file   Social History Narrative   Review of Systems: Review of Systems  Constitutional: Negative for fever and chills.  Eyes: Positive for blurred vision, photophobia, pain and redness. Negative for double vision and discharge.  Cardiovascular: Negative for chest pain.     Objective:  Physical Exam: Filed Vitals:   09/05/15 1109  BP: 147/87  Pulse: 79  Temp: 98.4 F (36.9 C)  TempSrc: Oral  Height: 5\' 10"  (1.778 m)  Weight: 244 lb 6.4 oz (110.859 kg)  SpO2: 100%  Physical Exam  Constitutional: He is well-developed, well-nourished, and in no distress.  Eyes: EOM are normal. Right eye exhibits chemosis. Right eye exhibits no discharge and no exudate. No foreign body present in the right eye. Left eye exhibits chemosis. Left eye exhibits no discharge and no exudate. No foreign body present in the left eye.  Visual Fields:      Right eye: Detects motion in the upper temporal quadrant. Detects motion in the upper nasal quadrant. Detects motion in the lower temporal quadrant. Detects motion in the lower nasal quadrant.  Nursing note and vitals reviewed.   Assessment & Plan:  Case discussed with Dr. Lynnae January  Chemical injury of eye A: Chemical injury of eye s/p irrigation in the emergency department  P: Referral to opthalmology  Human immunodeficiency virus (HIV) disease HPI: Last seen by ID in May 2016 CD4 at that time 270.  A: HIV disease  P: Asked patient to follow up with ID.    Medications Ordered No orders of the defined types were placed in this encounter.   Other Orders Orders Placed This Encounter  Procedures  . Ambulatory referral to Ophthalmology    Referral Priority:  Routine    Referral Type:  Consultation    Referral Reason:  Specialty Services  Required    Requested Specialty:  Ophthalmology    Number of Visits Requested:  1   Follow Up: Return in about 2 weeks (around 09/19/2015).

## 2015-09-05 NOTE — Patient Instructions (Signed)
General Instructions:  I am going to refer you over to the eye doctor.   Please bring your medicines with you each time you come to clinic.  Medicines may include prescription medications, over-the-counter medications, herbal remedies, eye drops, vitamins, or other pills.   Progress Toward Treatment Goals:  Treatment Goal 11/10/2013  Blood pressure unchanged  Stop smoking smoking the same amount    Self Care Goals & Plans:  Self Care Goal 05/03/2015  Manage my medications refill my medications on time; bring my medications to every visit; take my medicines as prescribed  Monitor my health keep track of my blood pressure  Eat healthy foods eat more vegetables; eat foods that are low in salt; eat baked foods instead of fried foods  Be physically active find an activity I enjoy  Stop smoking cut down the number of cigarettes smoked    No flowsheet data found.   Care Management & Community Referrals:  Referral 06/15/2013  Referrals made for care management support none needed

## 2015-09-06 DIAGNOSIS — D631 Anemia in chronic kidney disease: Secondary | ICD-10-CM | POA: Diagnosis not present

## 2015-09-06 DIAGNOSIS — N186 End stage renal disease: Secondary | ICD-10-CM | POA: Diagnosis not present

## 2015-09-06 DIAGNOSIS — N2581 Secondary hyperparathyroidism of renal origin: Secondary | ICD-10-CM | POA: Diagnosis not present

## 2015-09-06 DIAGNOSIS — D509 Iron deficiency anemia, unspecified: Secondary | ICD-10-CM | POA: Diagnosis not present

## 2015-09-08 ENCOUNTER — Other Ambulatory Visit: Payer: Self-pay | Admitting: Internal Medicine

## 2015-09-08 DIAGNOSIS — D509 Iron deficiency anemia, unspecified: Secondary | ICD-10-CM | POA: Diagnosis not present

## 2015-09-08 DIAGNOSIS — D631 Anemia in chronic kidney disease: Secondary | ICD-10-CM | POA: Diagnosis not present

## 2015-09-08 DIAGNOSIS — N186 End stage renal disease: Secondary | ICD-10-CM | POA: Diagnosis not present

## 2015-09-08 DIAGNOSIS — N2581 Secondary hyperparathyroidism of renal origin: Secondary | ICD-10-CM | POA: Diagnosis not present

## 2015-09-08 NOTE — Assessment & Plan Note (Signed)
A: Chemical injury of eye s/p irrigation in the emergency department  P: Referral to opthalmology

## 2015-09-08 NOTE — Assessment & Plan Note (Signed)
HPI: Last seen by ID in May 2016 CD4 at that time 270.  A: HIV disease  P: Asked patient to follow up with ID.

## 2015-09-10 NOTE — Progress Notes (Signed)
Internal Medicine Clinic Attending  Case discussed with Dr. Hoffman soon after the resident saw the patient.  We reviewed the resident's history and exam and pertinent patient test results.  I agree with the assessment, diagnosis, and plan of care documented in the resident's note. 

## 2015-09-11 DIAGNOSIS — H35342 Macular cyst, hole, or pseudohole, left eye: Secondary | ICD-10-CM | POA: Diagnosis not present

## 2015-09-12 DIAGNOSIS — D509 Iron deficiency anemia, unspecified: Secondary | ICD-10-CM | POA: Diagnosis not present

## 2015-09-12 DIAGNOSIS — D631 Anemia in chronic kidney disease: Secondary | ICD-10-CM | POA: Diagnosis not present

## 2015-09-12 DIAGNOSIS — N2581 Secondary hyperparathyroidism of renal origin: Secondary | ICD-10-CM | POA: Diagnosis not present

## 2015-09-12 DIAGNOSIS — N186 End stage renal disease: Secondary | ICD-10-CM | POA: Diagnosis not present

## 2015-09-13 DIAGNOSIS — N186 End stage renal disease: Secondary | ICD-10-CM | POA: Diagnosis not present

## 2015-09-13 DIAGNOSIS — D631 Anemia in chronic kidney disease: Secondary | ICD-10-CM | POA: Diagnosis not present

## 2015-09-13 DIAGNOSIS — N2581 Secondary hyperparathyroidism of renal origin: Secondary | ICD-10-CM | POA: Diagnosis not present

## 2015-09-13 DIAGNOSIS — D509 Iron deficiency anemia, unspecified: Secondary | ICD-10-CM | POA: Diagnosis not present

## 2015-09-15 DIAGNOSIS — N2581 Secondary hyperparathyroidism of renal origin: Secondary | ICD-10-CM | POA: Diagnosis not present

## 2015-09-15 DIAGNOSIS — N186 End stage renal disease: Secondary | ICD-10-CM | POA: Diagnosis not present

## 2015-09-15 DIAGNOSIS — D631 Anemia in chronic kidney disease: Secondary | ICD-10-CM | POA: Diagnosis not present

## 2015-09-15 DIAGNOSIS — D509 Iron deficiency anemia, unspecified: Secondary | ICD-10-CM | POA: Diagnosis not present

## 2015-09-18 DIAGNOSIS — D631 Anemia in chronic kidney disease: Secondary | ICD-10-CM | POA: Diagnosis not present

## 2015-09-18 DIAGNOSIS — Z992 Dependence on renal dialysis: Secondary | ICD-10-CM | POA: Diagnosis not present

## 2015-09-18 DIAGNOSIS — D509 Iron deficiency anemia, unspecified: Secondary | ICD-10-CM | POA: Diagnosis not present

## 2015-09-18 DIAGNOSIS — N186 End stage renal disease: Secondary | ICD-10-CM | POA: Diagnosis not present

## 2015-09-18 DIAGNOSIS — N2581 Secondary hyperparathyroidism of renal origin: Secondary | ICD-10-CM | POA: Diagnosis not present

## 2015-09-18 DIAGNOSIS — I120 Hypertensive chronic kidney disease with stage 5 chronic kidney disease or end stage renal disease: Secondary | ICD-10-CM | POA: Diagnosis not present

## 2015-09-20 DIAGNOSIS — N2581 Secondary hyperparathyroidism of renal origin: Secondary | ICD-10-CM | POA: Diagnosis not present

## 2015-09-20 DIAGNOSIS — D509 Iron deficiency anemia, unspecified: Secondary | ICD-10-CM | POA: Diagnosis not present

## 2015-09-20 DIAGNOSIS — D631 Anemia in chronic kidney disease: Secondary | ICD-10-CM | POA: Diagnosis not present

## 2015-09-20 DIAGNOSIS — N186 End stage renal disease: Secondary | ICD-10-CM | POA: Diagnosis not present

## 2015-09-22 DIAGNOSIS — D509 Iron deficiency anemia, unspecified: Secondary | ICD-10-CM | POA: Diagnosis not present

## 2015-09-22 DIAGNOSIS — N2581 Secondary hyperparathyroidism of renal origin: Secondary | ICD-10-CM | POA: Diagnosis not present

## 2015-09-22 DIAGNOSIS — N186 End stage renal disease: Secondary | ICD-10-CM | POA: Diagnosis not present

## 2015-09-22 DIAGNOSIS — D631 Anemia in chronic kidney disease: Secondary | ICD-10-CM | POA: Diagnosis not present

## 2015-09-24 ENCOUNTER — Other Ambulatory Visit (HOSPITAL_COMMUNITY)
Admission: RE | Admit: 2015-09-24 | Discharge: 2015-09-24 | Disposition: A | Discharge status: Home / Self Care | Payer: Medicare Other | Source: Ambulatory Visit | Attending: Internal Medicine | Admitting: Internal Medicine

## 2015-09-24 ENCOUNTER — Other Ambulatory Visit: Payer: Medicare Other

## 2015-09-24 DIAGNOSIS — Z79899 Other long term (current) drug therapy: Secondary | ICD-10-CM | POA: Diagnosis not present

## 2015-09-24 DIAGNOSIS — Z113 Encounter for screening for infections with a predominantly sexual mode of transmission: Secondary | ICD-10-CM

## 2015-09-24 DIAGNOSIS — B20 Human immunodeficiency virus [HIV] disease: Secondary | ICD-10-CM | POA: Diagnosis not present

## 2015-09-24 LAB — COMPREHENSIVE METABOLIC PANEL
ALBUMIN: 3.9 g/dL (ref 3.6–5.1)
ALT: 10 U/L (ref 9–46)
AST: 12 U/L (ref 10–35)
Alkaline Phosphatase: 72 U/L (ref 40–115)
BUN: 39 mg/dL — ABNORMAL HIGH (ref 7–25)
CO2: 26 mmol/L (ref 20–31)
CREATININE: 10.32 mg/dL — AB (ref 0.70–1.33)
Calcium: 9.8 mg/dL (ref 8.6–10.3)
Chloride: 94 mmol/L — ABNORMAL LOW (ref 98–110)
GLUCOSE: 164 mg/dL — AB (ref 65–99)
Potassium: 4.3 mmol/L (ref 3.5–5.3)
Sodium: 141 mmol/L (ref 135–146)
Total Bilirubin: 0.2 mg/dL (ref 0.2–1.2)
Total Protein: 6.9 g/dL (ref 6.1–8.1)

## 2015-09-24 LAB — CBC WITH DIFFERENTIAL/PLATELET
Basophils Absolute: 0 10*3/uL (ref 0.0–0.1)
Basophils Relative: 0 % (ref 0–1)
EOS PCT: 4 % (ref 0–5)
Eosinophils Absolute: 0.3 10*3/uL (ref 0.0–0.7)
HEMATOCRIT: 32.2 % — AB (ref 39.0–52.0)
HEMOGLOBIN: 10.7 g/dL — AB (ref 13.0–17.0)
LYMPHS PCT: 20 % (ref 12–46)
Lymphs Abs: 1.4 10*3/uL (ref 0.7–4.0)
MCH: 28.9 pg (ref 26.0–34.0)
MCHC: 33.2 g/dL (ref 30.0–36.0)
MCV: 87 fL (ref 78.0–100.0)
MONO ABS: 0.4 10*3/uL (ref 0.1–1.0)
MPV: 9.5 fL (ref 8.6–12.4)
Monocytes Relative: 6 % (ref 3–12)
NEUTROS ABS: 4.8 10*3/uL (ref 1.7–7.7)
Neutrophils Relative %: 70 % (ref 43–77)
Platelets: 196 10*3/uL (ref 150–400)
RBC: 3.7 MIL/uL — ABNORMAL LOW (ref 4.22–5.81)
RDW: 16.3 % — AB (ref 11.5–15.5)
WBC: 6.9 10*3/uL (ref 4.0–10.5)

## 2015-09-24 LAB — LIPID PANEL
CHOL/HDL RATIO: 3 ratio (ref <=5.0)
CHOLESTEROL: 121 mg/dL — AB (ref 125–200)
HDL: 40 mg/dL (ref >=40)
LDL Cholesterol: 61 mg/dL (ref <130)
TRIGLYCERIDES: 99 mg/dL (ref <150)
VLDL: 20 mg/dL (ref <30)

## 2015-09-25 ENCOUNTER — Telehealth: Payer: Self-pay | Admitting: *Deleted

## 2015-09-25 DIAGNOSIS — D631 Anemia in chronic kidney disease: Secondary | ICD-10-CM | POA: Diagnosis not present

## 2015-09-25 DIAGNOSIS — N2581 Secondary hyperparathyroidism of renal origin: Secondary | ICD-10-CM | POA: Diagnosis not present

## 2015-09-25 DIAGNOSIS — N186 End stage renal disease: Secondary | ICD-10-CM | POA: Diagnosis not present

## 2015-09-25 DIAGNOSIS — D509 Iron deficiency anemia, unspecified: Secondary | ICD-10-CM | POA: Diagnosis not present

## 2015-09-25 LAB — T-HELPER CELL (CD4) - (RCID CLINIC ONLY)
CD4 T CELL ABS: 570 /uL (ref 400–2700)
CD4 T CELL HELPER: 36 % (ref 33–55)

## 2015-09-25 LAB — HIV-1 RNA QUANT-NO REFLEX-BLD
HIV 1 RNA Quant: <20 copies/mL (ref <20)
HIV-1 RNA Quant, Log: <1.3 Log copies/mL (ref <1.30)

## 2015-09-25 LAB — URINE CYTOLOGY ANCILLARY ONLY
CHLAMYDIA, DNA PROBE: NEGATIVE
NEISSERIA GONORRHEA: NEGATIVE

## 2015-09-25 NOTE — Telephone Encounter (Signed)
ALERT high CR = 10.32 in pt with CKD reported.  Known End Stage Renal disease patient.  Forwarding to [name].

## 2015-09-26 DIAGNOSIS — D509 Iron deficiency anemia, unspecified: Secondary | ICD-10-CM | POA: Diagnosis not present

## 2015-09-26 DIAGNOSIS — N2581 Secondary hyperparathyroidism of renal origin: Secondary | ICD-10-CM | POA: Diagnosis not present

## 2015-09-26 DIAGNOSIS — D631 Anemia in chronic kidney disease: Secondary | ICD-10-CM | POA: Diagnosis not present

## 2015-09-26 DIAGNOSIS — N186 End stage renal disease: Secondary | ICD-10-CM | POA: Diagnosis not present

## 2015-09-27 ENCOUNTER — Telehealth: Payer: Self-pay | Admitting: *Deleted

## 2015-09-27 DIAGNOSIS — J302 Other seasonal allergic rhinitis: Secondary | ICD-10-CM

## 2015-09-27 LAB — RPR

## 2015-09-27 NOTE — Telephone Encounter (Signed)
Request for montelukast rx.  Previously discontinued by MD.  MD please advise re:  Refill and specifics.

## 2015-09-29 DIAGNOSIS — N2581 Secondary hyperparathyroidism of renal origin: Secondary | ICD-10-CM | POA: Diagnosis not present

## 2015-09-29 DIAGNOSIS — D509 Iron deficiency anemia, unspecified: Secondary | ICD-10-CM | POA: Diagnosis not present

## 2015-09-29 DIAGNOSIS — D631 Anemia in chronic kidney disease: Secondary | ICD-10-CM | POA: Diagnosis not present

## 2015-09-29 DIAGNOSIS — N186 End stage renal disease: Secondary | ICD-10-CM | POA: Diagnosis not present

## 2015-10-01 LAB — HLA B*5701: HLA-B*5701 w/rflx HLA-B High: NEGATIVE

## 2015-10-02 DIAGNOSIS — D631 Anemia in chronic kidney disease: Secondary | ICD-10-CM | POA: Diagnosis not present

## 2015-10-02 DIAGNOSIS — N2581 Secondary hyperparathyroidism of renal origin: Secondary | ICD-10-CM | POA: Diagnosis not present

## 2015-10-02 DIAGNOSIS — D509 Iron deficiency anemia, unspecified: Secondary | ICD-10-CM | POA: Diagnosis not present

## 2015-10-02 DIAGNOSIS — J302 Other seasonal allergic rhinitis: Secondary | ICD-10-CM | POA: Insufficient documentation

## 2015-10-02 DIAGNOSIS — N186 End stage renal disease: Secondary | ICD-10-CM | POA: Diagnosis not present

## 2015-10-02 MED ORDER — MONTELUKAST SODIUM 10 MG PO TABS: 10.0000 mg | ORAL_TABLET | Freq: Every day | ORAL | Status: DC

## 2015-10-02 NOTE — Addendum Note (Signed)
Addended by: Lorne Skeens D on: 10/02/2015 04:31 PM   Modules accepted: Orders, Medications

## 2015-10-02 NOTE — Telephone Encounter (Signed)
Can refill. 

## 2015-10-04 DIAGNOSIS — D631 Anemia in chronic kidney disease: Secondary | ICD-10-CM | POA: Diagnosis not present

## 2015-10-04 DIAGNOSIS — N2581 Secondary hyperparathyroidism of renal origin: Secondary | ICD-10-CM | POA: Diagnosis not present

## 2015-10-04 DIAGNOSIS — D509 Iron deficiency anemia, unspecified: Secondary | ICD-10-CM | POA: Diagnosis not present

## 2015-10-04 DIAGNOSIS — N186 End stage renal disease: Secondary | ICD-10-CM | POA: Diagnosis not present

## 2015-10-06 ENCOUNTER — Other Ambulatory Visit: Payer: Self-pay | Admitting: Internal Medicine

## 2015-10-06 DIAGNOSIS — D631 Anemia in chronic kidney disease: Secondary | ICD-10-CM | POA: Diagnosis not present

## 2015-10-06 DIAGNOSIS — N2581 Secondary hyperparathyroidism of renal origin: Secondary | ICD-10-CM | POA: Diagnosis not present

## 2015-10-06 DIAGNOSIS — N186 End stage renal disease: Secondary | ICD-10-CM | POA: Diagnosis not present

## 2015-10-06 DIAGNOSIS — D509 Iron deficiency anemia, unspecified: Secondary | ICD-10-CM | POA: Diagnosis not present

## 2015-10-08 ENCOUNTER — Other Ambulatory Visit: Payer: Self-pay | Admitting: Internal Medicine

## 2015-10-08 ENCOUNTER — Ambulatory Visit (INDEPENDENT_AMBULATORY_CARE_PROVIDER_SITE_OTHER): Payer: Medicare Other | Admitting: Internal Medicine

## 2015-10-08 ENCOUNTER — Encounter: Payer: Self-pay | Admitting: Internal Medicine

## 2015-10-08 VITALS — BP 181/97 | HR 85 | Temp 98.2°F | Wt 243.0 lb

## 2015-10-08 DIAGNOSIS — M1000 Idiopathic gout, unspecified site: Secondary | ICD-10-CM | POA: Diagnosis not present

## 2015-10-08 DIAGNOSIS — I10 Essential (primary) hypertension: Secondary | ICD-10-CM | POA: Diagnosis not present

## 2015-10-08 DIAGNOSIS — B350 Tinea barbae and tinea capitis: Secondary | ICD-10-CM

## 2015-10-08 DIAGNOSIS — B20 Human immunodeficiency virus [HIV] disease: Secondary | ICD-10-CM | POA: Diagnosis not present

## 2015-10-08 MED ORDER — DOLUTEGRAVIR SODIUM 50 MG PO TABS
50.0000 mg | ORAL_TABLET | Freq: Every day | ORAL | Status: DC
Start: 2015-10-08 — End: 2016-01-03

## 2015-10-08 MED ORDER — FLUCONAZOLE 100 MG PO TABS: 100.0000 mg | ORAL_TABLET | Freq: Every day | ORAL | Status: DC

## 2015-10-08 MED ORDER — OXYCODONE-ACETAMINOPHEN 5-325 MG PO TABS: 1.0000 | ORAL_TABLET | Freq: Four times a day (QID) | ORAL | Status: DC | PRN

## 2015-10-08 NOTE — Progress Notes (Signed)
Patient ID: Darrell Johnson, male   DOB: 1966-04-13, 50 y.o.   MRN: [id number]       Patient ID: Darrell Johnson, male   DOB: [birth year], 50 y.o.   MRN: [id number]  HPI Darrell Johnson is a 50yo M with HIV disease, well controlled, CD 4 count of570/VL<20 on RLG, DRVr, TDF weekly. With ESRD on HD on tu-th-sat. He recently sustained chemical corneal injury to eyes. Using eyes drops to ease burning sensation, still have mild discomfort L > R eye. He is otherwise doing ok with hemodialysis, still feels fatigue and describes body aches from each HD session. He notices knee pain similar to prior gout flares, but hasnt had recent gout flares  Outpatient Encounter Prescriptions as of 10/08/2015  Medication Sig  . amLODipine (NORVASC) 5 MG tablet Take 2 tablets (10 mg total) by mouth daily.  . calcitRIOL (ROCALTROL) 0.5 MCG capsule Take 0.5 mcg by mouth daily. Given at dialysis  . carvedilol (COREG) 25 MG tablet Take 1 tablet (25 mg total) by mouth 2 (two) times daily with a meal.  . doxazosin (CARDURA) 8 MG tablet TAKE 1 TABLET (8 MG TOTAL) BY MOUTH AT BEDTIME.  . Febuxostat (ULORIC) 80 MG TABS Take 80 mg by mouth daily.  . furosemide (LASIX) 40 MG tablet TAKE 1 TABLET (40 MG TOTAL) BY MOUTH DAILY.  . ISENTRESS 400 MG tablet TAKE 1 TABLET BY MOUTH TWICE A DAY  . PREZISTA 800 MG tablet TAKE 1 TABLET (800 MG TOTAL) BY MOUTH DAILY WITH BREAKFAST.  Marland Kitchen RENVELA 800 MG tablet Take 800 mg by mouth 3 (three) times daily with meals.   . ritonavir (NORVIR) 100 MG TABS tablet TAKE 1 TABLET (100MG  TOTAL) BY MOUTH DAILY  . sodium bicarbonate 650 MG tablet Take 650 mg by mouth 2 (two) times daily.  Marland Kitchen tenofovir (VIREAD) 300 MG tablet Take 1 tablet (300 mg total) by mouth once a week.  . erythromycin ophthalmic ointment Place a 1/2 inch ribbon of ointment into the lower eyelid. (Patient not taking: Reported on 10/08/2015)  . HYDROcodone-acetaminophen (NORCO) 10-325 MG per tablet Take 1 tablet by mouth every 8 (eight) hours as  needed. (Patient not taking: Reported on 10/08/2015)  . hydrocortisone (ANUSOL-HC) 2.5 % rectal cream Apply rectally 2 times daily (Patient not taking: Reported on 10/08/2015)  . montelukast (SINGULAIR) 10 MG tablet Take 1 tablet (10 mg total) by mouth at bedtime. (Patient not taking: Reported on 10/08/2015)  . triamcinolone cream (KENALOG) 0.1 % Apply 1 application topically 2 (two) times daily. (Patient not taking: Reported on 10/08/2015)   No facility-administered encounter medications on file as of 10/08/2015.     Patient Active Problem List   Diagnosis Date Noted  . Seasonal allergies   . Chemical injury of eye 09/05/2015  . Maculopapular rash 08/09/2015  . Healthcare maintenance 05/03/2015  . Headache 03/19/2015  . Hemorrhoid 11/30/2014  . AIDS ([place])   . Leukopenia   . Anemia in chronic kidney disease 08/30/2014  . End stage renal disease ([place]) 12/27/2013  . Drug noncompliance 11/07/2013  . History of syphilis 11/07/2013  . Arthritis 09/09/2013  . Tobacco abuse 09/07/2013  . Chronic pain disorder 04/28/2013  . HYPERTRIGLYCERIDEMIA 11/01/2009  . Gout 08/13/2009  . ERECTILE DYSFUNCTION 06/08/2008  . Human immunodeficiency virus (HIV) disease ([place]) 05/08/2006  . PERIPHERAL NEUROPATHY 05/08/2006  . Essential hypertension 05/08/2006  . SEIZURE DISORDER 05/08/2006     Health Maintenance Due  Topic Date Due  . INFLUENZA VACCINE  02/19/2015  Review of Systems 10 point ros is negative except what is mentioned in hpi Physical Exam   BP 181/97 mmHg  Pulse 85  Temp(Src) 98.2 F (36.8 C) (Oral)  Wt 243 lb (110.224 kg) Physical Exam  Constitutional: He is oriented to person, place, and time. He appears well-developed and well-nourished. No distress.  HENT:  Mouth/Throat: Oropharynx is clear and moist. No oropharyngeal exudate.  Cardiovascular: Normal rate, regular rhythm and normal heart sounds. Exam reveals no gallop and no friction rub.  No murmur heard.    Pulmonary/Chest: Effort normal and breath sounds normal. No respiratory distress. He has no wheezes.  Abdominal: Soft. Bowel sounds are normal. He exhibits no distension. There is no tenderness.  Ext: not swollen knees. Trace edema LE bilaterally Lymphadenopathy:  He has no cervical adenopathy.  Neurological: He is alert and oriented to person, place, and time.  Skin: Skin is warm and dry. No rash noted. No erythema.  Psychiatric: He has a normal mood and affect. His behavior is normal.    Lab Results  Component Value Date   CD4TCELL 36 09/24/2015   Lab Results  Component Value Date   CD4TABS 570 09/24/2015   CD4TABS 270* 11/22/2014   CD4TABS 230* 08/17/2014   Lab Results  Component Value Date   HIV1RNAQUANT <20 09/24/2015   Lab Results  Component Value Date   HEPBSAB POS* 08/17/2014   No results found for: RPR  CBC Lab Results  Component Value Date   WBC 6.9 09/24/2015   RBC 3.70* 09/24/2015   HGB 10.7* 09/24/2015   HCT 32.2* 09/24/2015   PLT 196 09/24/2015   MCV 87.0 09/24/2015   MCH 28.9 09/24/2015   MCHC 33.2 09/24/2015   RDW 16.3* 09/24/2015   LYMPHSABS 1.4 09/24/2015   MONOABS 0.4 09/24/2015   EOSABS 0.3 09/24/2015   BASOSABS 0.0 09/24/2015   BMET Lab Results  Component Value Date   NA 141 09/24/2015   K 4.3 09/24/2015   CL 94* 09/24/2015   CO2 26 09/24/2015   GLUCOSE 164* 09/24/2015   BUN 39* 09/24/2015   CREATININE 10.32* 09/24/2015   CALCIUM 9.8 09/24/2015   GFRNONAA 12* 11/16/2014   GFRAA 14* 11/16/2014     Assessment and Plan  hiv disease = well controlled. Will simplify by changing RLG to [facility] daily  Tinea capitis = fluconazole 100mg  daily  Hypertension = he is on several agents. Asked him to see if he needs further management with his nephrologist. He states that he is late in taking meds. Counseled on importance on taking meds daily

## 2015-10-09 DIAGNOSIS — N2581 Secondary hyperparathyroidism of renal origin: Secondary | ICD-10-CM | POA: Diagnosis not present

## 2015-10-09 DIAGNOSIS — D509 Iron deficiency anemia, unspecified: Secondary | ICD-10-CM | POA: Diagnosis not present

## 2015-10-09 DIAGNOSIS — D631 Anemia in chronic kidney disease: Secondary | ICD-10-CM | POA: Diagnosis not present

## 2015-10-09 DIAGNOSIS — N186 End stage renal disease: Secondary | ICD-10-CM | POA: Diagnosis not present

## 2015-10-11 DIAGNOSIS — D509 Iron deficiency anemia, unspecified: Secondary | ICD-10-CM | POA: Diagnosis not present

## 2015-10-11 DIAGNOSIS — N2581 Secondary hyperparathyroidism of renal origin: Secondary | ICD-10-CM | POA: Diagnosis not present

## 2015-10-11 DIAGNOSIS — N186 End stage renal disease: Secondary | ICD-10-CM | POA: Diagnosis not present

## 2015-10-11 DIAGNOSIS — D631 Anemia in chronic kidney disease: Secondary | ICD-10-CM | POA: Diagnosis not present

## 2015-10-13 DIAGNOSIS — D509 Iron deficiency anemia, unspecified: Secondary | ICD-10-CM | POA: Diagnosis not present

## 2015-10-13 DIAGNOSIS — D631 Anemia in chronic kidney disease: Secondary | ICD-10-CM | POA: Diagnosis not present

## 2015-10-13 DIAGNOSIS — N186 End stage renal disease: Secondary | ICD-10-CM | POA: Diagnosis not present

## 2015-10-13 DIAGNOSIS — N2581 Secondary hyperparathyroidism of renal origin: Secondary | ICD-10-CM | POA: Diagnosis not present

## 2015-10-16 DIAGNOSIS — D631 Anemia in chronic kidney disease: Secondary | ICD-10-CM | POA: Diagnosis not present

## 2015-10-16 DIAGNOSIS — D509 Iron deficiency anemia, unspecified: Secondary | ICD-10-CM | POA: Diagnosis not present

## 2015-10-16 DIAGNOSIS — N186 End stage renal disease: Secondary | ICD-10-CM | POA: Diagnosis not present

## 2015-10-16 DIAGNOSIS — N2581 Secondary hyperparathyroidism of renal origin: Secondary | ICD-10-CM | POA: Diagnosis not present

## 2015-10-18 DIAGNOSIS — N186 End stage renal disease: Secondary | ICD-10-CM | POA: Diagnosis not present

## 2015-10-18 DIAGNOSIS — D631 Anemia in chronic kidney disease: Secondary | ICD-10-CM | POA: Diagnosis not present

## 2015-10-18 DIAGNOSIS — D509 Iron deficiency anemia, unspecified: Secondary | ICD-10-CM | POA: Diagnosis not present

## 2015-10-18 DIAGNOSIS — N2581 Secondary hyperparathyroidism of renal origin: Secondary | ICD-10-CM | POA: Diagnosis not present

## 2015-10-19 DIAGNOSIS — I120 Hypertensive chronic kidney disease with stage 5 chronic kidney disease or end stage renal disease: Secondary | ICD-10-CM | POA: Diagnosis not present

## 2015-10-19 DIAGNOSIS — Z992 Dependence on renal dialysis: Secondary | ICD-10-CM | POA: Diagnosis not present

## 2015-10-19 DIAGNOSIS — N186 End stage renal disease: Secondary | ICD-10-CM | POA: Diagnosis not present

## 2015-10-20 DIAGNOSIS — D509 Iron deficiency anemia, unspecified: Secondary | ICD-10-CM | POA: Diagnosis not present

## 2015-10-20 DIAGNOSIS — N2581 Secondary hyperparathyroidism of renal origin: Secondary | ICD-10-CM | POA: Diagnosis not present

## 2015-10-20 DIAGNOSIS — N186 End stage renal disease: Secondary | ICD-10-CM | POA: Diagnosis not present

## 2015-10-20 DIAGNOSIS — D631 Anemia in chronic kidney disease: Secondary | ICD-10-CM | POA: Diagnosis not present

## 2015-10-23 DIAGNOSIS — D509 Iron deficiency anemia, unspecified: Secondary | ICD-10-CM | POA: Diagnosis not present

## 2015-10-23 DIAGNOSIS — D631 Anemia in chronic kidney disease: Secondary | ICD-10-CM | POA: Diagnosis not present

## 2015-10-23 DIAGNOSIS — N186 End stage renal disease: Secondary | ICD-10-CM | POA: Diagnosis not present

## 2015-10-23 DIAGNOSIS — N2581 Secondary hyperparathyroidism of renal origin: Secondary | ICD-10-CM | POA: Diagnosis not present

## 2015-10-25 DIAGNOSIS — N186 End stage renal disease: Secondary | ICD-10-CM | POA: Diagnosis not present

## 2015-10-25 DIAGNOSIS — N2581 Secondary hyperparathyroidism of renal origin: Secondary | ICD-10-CM | POA: Diagnosis not present

## 2015-10-25 DIAGNOSIS — D509 Iron deficiency anemia, unspecified: Secondary | ICD-10-CM | POA: Diagnosis not present

## 2015-10-25 DIAGNOSIS — D631 Anemia in chronic kidney disease: Secondary | ICD-10-CM | POA: Diagnosis not present

## 2015-10-27 DIAGNOSIS — N186 End stage renal disease: Secondary | ICD-10-CM | POA: Diagnosis not present

## 2015-10-27 DIAGNOSIS — D509 Iron deficiency anemia, unspecified: Secondary | ICD-10-CM | POA: Diagnosis not present

## 2015-10-27 DIAGNOSIS — D631 Anemia in chronic kidney disease: Secondary | ICD-10-CM | POA: Diagnosis not present

## 2015-10-27 DIAGNOSIS — N2581 Secondary hyperparathyroidism of renal origin: Secondary | ICD-10-CM | POA: Diagnosis not present

## 2015-10-30 DIAGNOSIS — N2581 Secondary hyperparathyroidism of renal origin: Secondary | ICD-10-CM | POA: Diagnosis not present

## 2015-10-30 DIAGNOSIS — N186 End stage renal disease: Secondary | ICD-10-CM | POA: Diagnosis not present

## 2015-10-30 DIAGNOSIS — D631 Anemia in chronic kidney disease: Secondary | ICD-10-CM | POA: Diagnosis not present

## 2015-10-30 DIAGNOSIS — D509 Iron deficiency anemia, unspecified: Secondary | ICD-10-CM | POA: Diagnosis not present

## 2015-11-01 DIAGNOSIS — D631 Anemia in chronic kidney disease: Secondary | ICD-10-CM | POA: Diagnosis not present

## 2015-11-01 DIAGNOSIS — N186 End stage renal disease: Secondary | ICD-10-CM | POA: Diagnosis not present

## 2015-11-01 DIAGNOSIS — D509 Iron deficiency anemia, unspecified: Secondary | ICD-10-CM | POA: Diagnosis not present

## 2015-11-01 DIAGNOSIS — N2581 Secondary hyperparathyroidism of renal origin: Secondary | ICD-10-CM | POA: Diagnosis not present

## 2015-11-02 DIAGNOSIS — N186 End stage renal disease: Secondary | ICD-10-CM | POA: Diagnosis not present

## 2015-11-02 DIAGNOSIS — D631 Anemia in chronic kidney disease: Secondary | ICD-10-CM | POA: Diagnosis not present

## 2015-11-02 DIAGNOSIS — N2581 Secondary hyperparathyroidism of renal origin: Secondary | ICD-10-CM | POA: Diagnosis not present

## 2015-11-02 DIAGNOSIS — D509 Iron deficiency anemia, unspecified: Secondary | ICD-10-CM | POA: Diagnosis not present

## 2015-11-06 DIAGNOSIS — D509 Iron deficiency anemia, unspecified: Secondary | ICD-10-CM | POA: Diagnosis not present

## 2015-11-06 DIAGNOSIS — D631 Anemia in chronic kidney disease: Secondary | ICD-10-CM | POA: Diagnosis not present

## 2015-11-06 DIAGNOSIS — N2581 Secondary hyperparathyroidism of renal origin: Secondary | ICD-10-CM | POA: Diagnosis not present

## 2015-11-06 DIAGNOSIS — N186 End stage renal disease: Secondary | ICD-10-CM | POA: Diagnosis not present

## 2015-11-08 DIAGNOSIS — D509 Iron deficiency anemia, unspecified: Secondary | ICD-10-CM | POA: Diagnosis not present

## 2015-11-08 DIAGNOSIS — N2581 Secondary hyperparathyroidism of renal origin: Secondary | ICD-10-CM | POA: Diagnosis not present

## 2015-11-08 DIAGNOSIS — N186 End stage renal disease: Secondary | ICD-10-CM | POA: Diagnosis not present

## 2015-11-08 DIAGNOSIS — D631 Anemia in chronic kidney disease: Secondary | ICD-10-CM | POA: Diagnosis not present

## 2015-11-10 DIAGNOSIS — D509 Iron deficiency anemia, unspecified: Secondary | ICD-10-CM | POA: Diagnosis not present

## 2015-11-10 DIAGNOSIS — D631 Anemia in chronic kidney disease: Secondary | ICD-10-CM | POA: Diagnosis not present

## 2015-11-10 DIAGNOSIS — N2581 Secondary hyperparathyroidism of renal origin: Secondary | ICD-10-CM | POA: Diagnosis not present

## 2015-11-10 DIAGNOSIS — N186 End stage renal disease: Secondary | ICD-10-CM | POA: Diagnosis not present

## 2015-11-13 ENCOUNTER — Other Ambulatory Visit (HOSPITAL_COMMUNITY): Payer: Self-pay | Admitting: Nephrology

## 2015-11-13 DIAGNOSIS — N186 End stage renal disease: Secondary | ICD-10-CM | POA: Diagnosis not present

## 2015-11-13 DIAGNOSIS — D631 Anemia in chronic kidney disease: Secondary | ICD-10-CM | POA: Diagnosis not present

## 2015-11-13 DIAGNOSIS — Z01810 Encounter for preprocedural cardiovascular examination: Secondary | ICD-10-CM

## 2015-11-13 DIAGNOSIS — N2581 Secondary hyperparathyroidism of renal origin: Secondary | ICD-10-CM | POA: Diagnosis not present

## 2015-11-13 DIAGNOSIS — D509 Iron deficiency anemia, unspecified: Secondary | ICD-10-CM | POA: Diagnosis not present

## 2015-11-15 DIAGNOSIS — N2581 Secondary hyperparathyroidism of renal origin: Secondary | ICD-10-CM | POA: Diagnosis not present

## 2015-11-15 DIAGNOSIS — D631 Anemia in chronic kidney disease: Secondary | ICD-10-CM | POA: Diagnosis not present

## 2015-11-15 DIAGNOSIS — D509 Iron deficiency anemia, unspecified: Secondary | ICD-10-CM | POA: Diagnosis not present

## 2015-11-15 DIAGNOSIS — N186 End stage renal disease: Secondary | ICD-10-CM | POA: Diagnosis not present

## 2015-11-17 DIAGNOSIS — N186 End stage renal disease: Secondary | ICD-10-CM | POA: Diagnosis not present

## 2015-11-17 DIAGNOSIS — D631 Anemia in chronic kidney disease: Secondary | ICD-10-CM | POA: Diagnosis not present

## 2015-11-17 DIAGNOSIS — D509 Iron deficiency anemia, unspecified: Secondary | ICD-10-CM | POA: Diagnosis not present

## 2015-11-17 DIAGNOSIS — N2581 Secondary hyperparathyroidism of renal origin: Secondary | ICD-10-CM | POA: Diagnosis not present

## 2015-11-18 DIAGNOSIS — I120 Hypertensive chronic kidney disease with stage 5 chronic kidney disease or end stage renal disease: Secondary | ICD-10-CM | POA: Diagnosis not present

## 2015-11-18 DIAGNOSIS — Z992 Dependence on renal dialysis: Secondary | ICD-10-CM | POA: Diagnosis not present

## 2015-11-18 DIAGNOSIS — N186 End stage renal disease: Secondary | ICD-10-CM | POA: Diagnosis not present

## 2015-11-19 ENCOUNTER — Telehealth (HOSPITAL_COMMUNITY): Payer: Self-pay | Admitting: *Deleted

## 2015-11-19 NOTE — Telephone Encounter (Signed)
Patient given detailed instructions per Myocardial Perfusion Study Information Sheet for the test on 11/21/15. Patient notified to arrive 15 minutes early and that it is imperative to arrive on time for appointment to keep from having the test rescheduled.  If you need to cancel or reschedule your appointment, please call the office within 24 hours of your appointment. Failure to do so may result in a cancellation of your appointment, and a $50 no show fee. Patient verbalized understanding.Hubbard Robinson, RN

## 2015-11-20 DIAGNOSIS — D631 Anemia in chronic kidney disease: Secondary | ICD-10-CM | POA: Diagnosis not present

## 2015-11-20 DIAGNOSIS — N2581 Secondary hyperparathyroidism of renal origin: Secondary | ICD-10-CM | POA: Diagnosis not present

## 2015-11-20 DIAGNOSIS — D509 Iron deficiency anemia, unspecified: Secondary | ICD-10-CM | POA: Diagnosis not present

## 2015-11-20 DIAGNOSIS — N186 End stage renal disease: Secondary | ICD-10-CM | POA: Diagnosis not present

## 2015-11-21 ENCOUNTER — Ambulatory Visit (HOSPITAL_COMMUNITY): Payer: Medicare Other | Attending: Internal Medicine

## 2015-11-21 DIAGNOSIS — Z01818 Encounter for other preprocedural examination: Secondary | ICD-10-CM

## 2015-11-21 DIAGNOSIS — I1311 Hypertensive heart and chronic kidney disease without heart failure, with stage 5 chronic kidney disease, or end stage renal disease: Secondary | ICD-10-CM | POA: Insufficient documentation

## 2015-11-21 DIAGNOSIS — N186 End stage renal disease: Secondary | ICD-10-CM | POA: Diagnosis not present

## 2015-11-21 DIAGNOSIS — Z01810 Encounter for preprocedural cardiovascular examination: Secondary | ICD-10-CM | POA: Diagnosis not present

## 2015-11-21 LAB — MYOCARDIAL PERFUSION IMAGING
CHL CUP NUCLEAR SRS: 0
LV sys vol: 80 mL
LVDIAVOL: 185 mL (ref 62–150)
NUC STRESS TID: 1.06
Peak HR: 87 {beats}/min
RATE: 0.35
Rest HR: 71 {beats}/min
SDS: 1
SSS: 1

## 2015-11-21 MED ORDER — TECHNETIUM TC 99M SESTAMIBI GENERIC - CARDIOLITE
11.0000 | Freq: Once | INTRAVENOUS | Status: AC | PRN
  Administered 2015-11-21: 11 via INTRAVENOUS

## 2015-11-21 MED ORDER — TECHNETIUM TC 99M SESTAMIBI GENERIC - CARDIOLITE
32.6000 | Freq: Once | INTRAVENOUS | Status: AC | PRN
  Administered 2015-11-21: 33 via INTRAVENOUS

## 2015-11-21 MED ORDER — REGADENOSON 0.4 MG/5ML IV SOLN
0.4000 mg | Freq: Once | INTRAVENOUS | Status: AC
Start: 2015-11-21 — End: 2015-11-21
  Administered 2015-11-21: 0.4 mg via INTRAVENOUS

## 2015-11-22 ENCOUNTER — Encounter: Payer: Self-pay | Admitting: Internal Medicine

## 2015-11-22 ENCOUNTER — Ambulatory Visit (INDEPENDENT_AMBULATORY_CARE_PROVIDER_SITE_OTHER): Payer: Medicare Other | Admitting: Internal Medicine

## 2015-11-22 VITALS — BP 145/110 | HR 93 | Temp 98.3°F | Wt 239.4 lb

## 2015-11-22 DIAGNOSIS — F17210 Nicotine dependence, cigarettes, uncomplicated: Secondary | ICD-10-CM

## 2015-11-22 DIAGNOSIS — D509 Iron deficiency anemia, unspecified: Secondary | ICD-10-CM | POA: Diagnosis not present

## 2015-11-22 DIAGNOSIS — D631 Anemia in chronic kidney disease: Secondary | ICD-10-CM | POA: Diagnosis not present

## 2015-11-22 DIAGNOSIS — B350 Tinea barbae and tinea capitis: Secondary | ICD-10-CM | POA: Diagnosis present

## 2015-11-22 DIAGNOSIS — N186 End stage renal disease: Secondary | ICD-10-CM | POA: Diagnosis not present

## 2015-11-22 DIAGNOSIS — R21 Rash and other nonspecific skin eruption: Secondary | ICD-10-CM

## 2015-11-22 DIAGNOSIS — N2581 Secondary hyperparathyroidism of renal origin: Secondary | ICD-10-CM | POA: Diagnosis not present

## 2015-11-22 MED ORDER — FLUCONAZOLE 100 MG PO TABS: 100.0000 mg | ORAL_TABLET | Freq: Every day | ORAL | Status: DC

## 2015-11-22 MED ORDER — TRIAMCINOLONE ACETONIDE 0.1 % EX CREA: 1.0000 "application " | TOPICAL_CREAM | Freq: Two times a day (BID) | CUTANEOUS | Status: DC

## 2015-11-22 NOTE — Patient Instructions (Signed)
Thank you for coming to see me today. It was a pleasure. Today we talked about:   Your rash: prescription sent for steroid cream plus 1 refill.  Also placed another dermatology referrral  Your scalp tinea capitis: prescription sent for another course of fluconazole.  Also please mention this to the dermatologist when you see them  Please follow-up with 1 in year  If you have any questions or concerns, please do not hesitate to call the office at [phone].  Take Care,   Jule Ser, DO

## 2015-11-24 DIAGNOSIS — N186 End stage renal disease: Secondary | ICD-10-CM | POA: Diagnosis not present

## 2015-11-24 DIAGNOSIS — D631 Anemia in chronic kidney disease: Secondary | ICD-10-CM | POA: Diagnosis not present

## 2015-11-24 DIAGNOSIS — N2581 Secondary hyperparathyroidism of renal origin: Secondary | ICD-10-CM | POA: Diagnosis not present

## 2015-11-24 DIAGNOSIS — D509 Iron deficiency anemia, unspecified: Secondary | ICD-10-CM | POA: Diagnosis not present

## 2015-11-24 DIAGNOSIS — B350 Tinea barbae and tinea capitis: Secondary | ICD-10-CM | POA: Insufficient documentation

## 2015-11-24 NOTE — Assessment & Plan Note (Signed)
Assessment: Patient reports a diffuse, maculopapular rash over his upper back and chest that began when he started on dialysis improved with triamcinolone cream.  He is out of this cream and would like a refill.  Also was unable to follow up with dermatology due to scheduling conflict with his other appointments.  Would like to be referred again.  Plan: - triamcinolone 0.1% cream BID - dermatology referral

## 2015-11-24 NOTE — Progress Notes (Signed)
Patient ID: Darrell Johnson, male   DOB: 03-Feb-1966, 50 y.o.   MRN: [id number]   Subjective:   Patient ID: Darrell Johnson male   DOB: 1966-06-18 50 y.o.   MRN: [id number]  HPI: [name] is a 50 y.o. male with PMHx as below presenting for follow up of tinea capitis and rash.  Please see A&P for status of medical conditions addressed at this visit.    Past Medical History  Diagnosis Date  . 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 Hypertension   . Seizures ([place])     last seizure was >5 years ago, pt has family history of seizures  . Syphilis 1997    history of syphilis 1997  . Chronic kidney disease     stage 3-4 CKD, followed by Dr. Moshe Cipro  . 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 Rib fractures 01/2009  . HIV infection ([place]) 1980's    on ART therapy since, followed by ID clinic, complicated  by neuropathy  . 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 Pneumonia   . Arthritis   . Anemia   . HYPERTENSION 05/08/2006  . Gout, unspecified 08/13/2009    Qualifier: Diagnosis of  By: Redmond Pulling  MD, Mateo Flow    . CHF (congestive heart failure) ([place])    Current Outpatient Prescriptions  Medication Sig Dispense Refill  . amLODipine (NORVASC) 5 MG tablet Take 2 tablets (10 mg total) by mouth daily. 30 tablet 2  . calcitRIOL (ROCALTROL) 0.5 MCG capsule Take 0.5 mcg by mouth daily. Given at dialysis    . carvedilol (COREG) 25 MG tablet TAKE 1 TABLET (25 MG TOTAL) BY MOUTH 2 (TWO) TIMES DAILY WITH A MEAL. 180 tablet 0  . dolutegravir (TIVICAY) 50 MG tablet Take 1 tablet (50 mg total) by mouth daily. 30 tablet 11  . doxazosin (CARDURA) 8 MG tablet TAKE 1 TABLET (8 MG TOTAL) BY MOUTH AT BEDTIME. 30 tablet 5  . erythromycin ophthalmic ointment Place a 1/2 inch ribbon of ointment into the lower eyelid.  (Patient not taking: Reported on 10/08/2015) 1 g 0  . Febuxostat (ULORIC) 80 MG TABS Take 80 mg by mouth daily.    . fluconazole (DIFLUCAN) 100 MG tablet Take 1 tablet (100 mg total) by mouth daily. 14 tablet 0  . furosemide (LASIX) 40 MG tablet TAKE 1 TABLET (40 MG TOTAL) BY MOUTH DAILY. 30 tablet 2  . hydrocortisone (ANUSOL-HC) 2.5 % rectal cream Apply rectally 2 times daily (Patient not taking: Reported on 10/08/2015) 28.35 g 0  . montelukast (SINGULAIR) 10 MG tablet Take 1 tablet (10 mg total) by mouth at bedtime. (Patient not taking: Reported on 10/08/2015) 30 tablet 3  . PREZISTA 800 MG tablet TAKE 1 TABLET (800 MG TOTAL) BY MOUTH DAILY WITH BREAKFAST. 30 tablet 5  . RENVELA 800 MG tablet Take 800 mg by mouth 3 (three) times daily with meals.     . ritonavir (NORVIR) 100 MG TABS tablet TAKE 1 TABLET (100MG  TOTAL) BY MOUTH DAILY 30 tablet 5  . sodium bicarbonate 650 MG tablet Take 650 mg by mouth 2 (two) times daily.  6  . tenofovir (VIREAD) 300 MG tablet Take 1 tablet (300 mg total) by mouth once a week. 12 tablet 5  . triamcinolone cream (KENALOG) 0.1 % Apply  1 application topically 2 (two) times daily. 28.4 g 1   No current facility-administered medications for this visit.   Family History  Problem Relation Age of Onset  . Cancer Mother   . COPD Father   . Diabetes Sister   . Hypertension Sister   . Diabetes Brother   . Hypertension Brother   . Stroke Neg Hx    Social History   Social History  . Marital Status: Married    Spouse Name: N/A  . Number of Children: N/A  . Years of Education: N/A   Social History Main Topics  . Smoking status: Current Every Day Smoker -- 0.25 packs/day for 20 years    Types: Cigarettes    Last Attempt to Quit: 09/26/2014  . Smokeless tobacco: Never Used     Comment: Trying to quit.  . Alcohol Use: No  . Drug Use: No  . Sexual Activity: Not Asked     Comment: given condoms   Other Topics Concern  . None   Social History Narrative    Review of Systems: Review of Systems  Constitutional: Negative.   Respiratory: Negative for cough.   Cardiovascular: Negative for chest pain.  Gastrointestinal: Negative.   Skin: Positive for rash.    Objective:  Physical Exam: Filed Vitals:   11/22/15 1539  BP: 145/110  Pulse: 93  Temp: 98.3 F (36.8 C)  TempSrc: Oral  Weight: 239 lb 6.4 oz (108.591 kg)  SpO2: 100%   General: well developed, well nourished.  No distress HEENT: + tinea capitis behind right ear, EOMI, no scleral icterus Pulm: normal effort Ext: warm and well perfused, no pedal edema Neuro: alert and oriented X3, cranial nerves II-XII grossly intact  Assessment & Plan:  Case discussed with Dr. Dareen Piano.  Maculopapular rash Assessment: Patient reports a diffuse, maculopapular rash over his upper back and chest that began when he started on dialysis improved with triamcinolone cream.  He is out of this cream and would like a refill.  Also was unable to follow up with dermatology due to scheduling conflict with his other appointments.  Would like to be referred again.  Plan: - triamcinolone 0.1% cream BID - dermatology referral  Tinea capitis Assessment: Treated a couple months ago with short course of fluconazole with noted improvement in symptoms.  States the lesion is pruritic but otherwise does not drain or bleed.    Plan: - fluconazole 100mg  daily x 14 days - rtc if not improved - dermatology referral as well for maculopapular rash

## 2015-11-24 NOTE — Assessment & Plan Note (Signed)
Assessment: Treated a couple months ago with short course of fluconazole with noted improvement in symptoms.  States the lesion is pruritic but otherwise does not drain or bleed.    Plan: - fluconazole 100mg  daily x 14 days - rtc if not improved - dermatology referral as well for maculopapular rash

## 2015-11-26 NOTE — Progress Notes (Signed)
Internal Medicine Clinic Attending  Case discussed with Dr. Wallace at the time of the visit.  We reviewed the resident's history and exam and pertinent patient test results.  I agree with the assessment, diagnosis, and plan of care documented in the resident's note.  

## 2015-11-27 DIAGNOSIS — N186 End stage renal disease: Secondary | ICD-10-CM | POA: Diagnosis not present

## 2015-11-27 DIAGNOSIS — D631 Anemia in chronic kidney disease: Secondary | ICD-10-CM | POA: Diagnosis not present

## 2015-11-27 DIAGNOSIS — N2581 Secondary hyperparathyroidism of renal origin: Secondary | ICD-10-CM | POA: Diagnosis not present

## 2015-11-27 DIAGNOSIS — D509 Iron deficiency anemia, unspecified: Secondary | ICD-10-CM | POA: Diagnosis not present

## 2015-11-29 DIAGNOSIS — D631 Anemia in chronic kidney disease: Secondary | ICD-10-CM | POA: Diagnosis not present

## 2015-11-29 DIAGNOSIS — N186 End stage renal disease: Secondary | ICD-10-CM | POA: Diagnosis not present

## 2015-11-29 DIAGNOSIS — N2581 Secondary hyperparathyroidism of renal origin: Secondary | ICD-10-CM | POA: Diagnosis not present

## 2015-11-29 DIAGNOSIS — D509 Iron deficiency anemia, unspecified: Secondary | ICD-10-CM | POA: Diagnosis not present

## 2015-12-01 DIAGNOSIS — N2581 Secondary hyperparathyroidism of renal origin: Secondary | ICD-10-CM | POA: Diagnosis not present

## 2015-12-01 DIAGNOSIS — D631 Anemia in chronic kidney disease: Secondary | ICD-10-CM | POA: Diagnosis not present

## 2015-12-01 DIAGNOSIS — D509 Iron deficiency anemia, unspecified: Secondary | ICD-10-CM | POA: Diagnosis not present

## 2015-12-01 DIAGNOSIS — N186 End stage renal disease: Secondary | ICD-10-CM | POA: Diagnosis not present

## 2015-12-04 DIAGNOSIS — N2581 Secondary hyperparathyroidism of renal origin: Secondary | ICD-10-CM | POA: Diagnosis not present

## 2015-12-04 DIAGNOSIS — D631 Anemia in chronic kidney disease: Secondary | ICD-10-CM | POA: Diagnosis not present

## 2015-12-04 DIAGNOSIS — D509 Iron deficiency anemia, unspecified: Secondary | ICD-10-CM | POA: Diagnosis not present

## 2015-12-04 DIAGNOSIS — N186 End stage renal disease: Secondary | ICD-10-CM | POA: Diagnosis not present

## 2015-12-04 NOTE — Addendum Note (Signed)
Addended by: Hulan Fray on: 12/04/2015 06:25 PM   Modules accepted: Orders

## 2015-12-06 DIAGNOSIS — D631 Anemia in chronic kidney disease: Secondary | ICD-10-CM | POA: Diagnosis not present

## 2015-12-06 DIAGNOSIS — D509 Iron deficiency anemia, unspecified: Secondary | ICD-10-CM | POA: Diagnosis not present

## 2015-12-06 DIAGNOSIS — N2581 Secondary hyperparathyroidism of renal origin: Secondary | ICD-10-CM | POA: Diagnosis not present

## 2015-12-06 DIAGNOSIS — N186 End stage renal disease: Secondary | ICD-10-CM | POA: Diagnosis not present

## 2015-12-07 DIAGNOSIS — B20 Human immunodeficiency virus [HIV] disease: Secondary | ICD-10-CM | POA: Diagnosis not present

## 2015-12-07 DIAGNOSIS — N186 End stage renal disease: Secondary | ICD-10-CM | POA: Diagnosis not present

## 2015-12-07 DIAGNOSIS — Z7682 Awaiting organ transplant status: Secondary | ICD-10-CM | POA: Diagnosis not present

## 2015-12-07 DIAGNOSIS — Z9229 Personal history of other drug therapy: Secondary | ICD-10-CM | POA: Diagnosis not present

## 2015-12-08 DIAGNOSIS — D509 Iron deficiency anemia, unspecified: Secondary | ICD-10-CM | POA: Diagnosis not present

## 2015-12-08 DIAGNOSIS — N186 End stage renal disease: Secondary | ICD-10-CM | POA: Diagnosis not present

## 2015-12-08 DIAGNOSIS — D631 Anemia in chronic kidney disease: Secondary | ICD-10-CM | POA: Diagnosis not present

## 2015-12-08 DIAGNOSIS — N2581 Secondary hyperparathyroidism of renal origin: Secondary | ICD-10-CM | POA: Diagnosis not present

## 2015-12-11 DIAGNOSIS — N186 End stage renal disease: Secondary | ICD-10-CM | POA: Diagnosis not present

## 2015-12-11 DIAGNOSIS — N2581 Secondary hyperparathyroidism of renal origin: Secondary | ICD-10-CM | POA: Diagnosis not present

## 2015-12-11 DIAGNOSIS — D509 Iron deficiency anemia, unspecified: Secondary | ICD-10-CM | POA: Diagnosis not present

## 2015-12-11 DIAGNOSIS — D631 Anemia in chronic kidney disease: Secondary | ICD-10-CM | POA: Diagnosis not present

## 2015-12-13 DIAGNOSIS — M2550 Pain in unspecified joint: Secondary | ICD-10-CM | POA: Diagnosis not present

## 2015-12-13 DIAGNOSIS — M150 Primary generalized (osteo)arthritis: Secondary | ICD-10-CM | POA: Diagnosis not present

## 2015-12-13 DIAGNOSIS — N2581 Secondary hyperparathyroidism of renal origin: Secondary | ICD-10-CM | POA: Diagnosis not present

## 2015-12-13 DIAGNOSIS — M1009 Idiopathic gout, multiple sites: Secondary | ICD-10-CM | POA: Diagnosis not present

## 2015-12-13 DIAGNOSIS — D509 Iron deficiency anemia, unspecified: Secondary | ICD-10-CM | POA: Diagnosis not present

## 2015-12-13 DIAGNOSIS — Z21 Asymptomatic human immunodeficiency virus [HIV] infection status: Secondary | ICD-10-CM | POA: Diagnosis not present

## 2015-12-13 DIAGNOSIS — D631 Anemia in chronic kidney disease: Secondary | ICD-10-CM | POA: Diagnosis not present

## 2015-12-13 DIAGNOSIS — N186 End stage renal disease: Secondary | ICD-10-CM | POA: Diagnosis not present

## 2015-12-15 DIAGNOSIS — D509 Iron deficiency anemia, unspecified: Secondary | ICD-10-CM | POA: Diagnosis not present

## 2015-12-15 DIAGNOSIS — N2581 Secondary hyperparathyroidism of renal origin: Secondary | ICD-10-CM | POA: Diagnosis not present

## 2015-12-15 DIAGNOSIS — D631 Anemia in chronic kidney disease: Secondary | ICD-10-CM | POA: Diagnosis not present

## 2015-12-15 DIAGNOSIS — N186 End stage renal disease: Secondary | ICD-10-CM | POA: Diagnosis not present

## 2015-12-17 ENCOUNTER — Other Ambulatory Visit: Payer: Self-pay | Admitting: Internal Medicine

## 2015-12-17 DIAGNOSIS — B20 Human immunodeficiency virus [HIV] disease: Secondary | ICD-10-CM

## 2015-12-18 DIAGNOSIS — N186 End stage renal disease: Secondary | ICD-10-CM | POA: Diagnosis not present

## 2015-12-18 DIAGNOSIS — D631 Anemia in chronic kidney disease: Secondary | ICD-10-CM | POA: Diagnosis not present

## 2015-12-18 DIAGNOSIS — N2581 Secondary hyperparathyroidism of renal origin: Secondary | ICD-10-CM | POA: Diagnosis not present

## 2015-12-18 DIAGNOSIS — D509 Iron deficiency anemia, unspecified: Secondary | ICD-10-CM | POA: Diagnosis not present

## 2015-12-19 ENCOUNTER — Other Ambulatory Visit: Payer: Self-pay | Admitting: Internal Medicine

## 2015-12-19 DIAGNOSIS — N186 End stage renal disease: Secondary | ICD-10-CM | POA: Diagnosis not present

## 2015-12-19 DIAGNOSIS — Z992 Dependence on renal dialysis: Secondary | ICD-10-CM | POA: Diagnosis not present

## 2015-12-19 DIAGNOSIS — I120 Hypertensive chronic kidney disease with stage 5 chronic kidney disease or end stage renal disease: Secondary | ICD-10-CM | POA: Diagnosis not present

## 2015-12-20 DIAGNOSIS — N186 End stage renal disease: Secondary | ICD-10-CM | POA: Diagnosis not present

## 2015-12-20 DIAGNOSIS — N2581 Secondary hyperparathyroidism of renal origin: Secondary | ICD-10-CM | POA: Diagnosis not present

## 2015-12-20 DIAGNOSIS — D509 Iron deficiency anemia, unspecified: Secondary | ICD-10-CM | POA: Diagnosis not present

## 2015-12-20 DIAGNOSIS — D631 Anemia in chronic kidney disease: Secondary | ICD-10-CM | POA: Diagnosis not present

## 2015-12-22 DIAGNOSIS — N2581 Secondary hyperparathyroidism of renal origin: Secondary | ICD-10-CM | POA: Diagnosis not present

## 2015-12-22 DIAGNOSIS — N186 End stage renal disease: Secondary | ICD-10-CM | POA: Diagnosis not present

## 2015-12-22 DIAGNOSIS — D631 Anemia in chronic kidney disease: Secondary | ICD-10-CM | POA: Diagnosis not present

## 2015-12-22 DIAGNOSIS — D509 Iron deficiency anemia, unspecified: Secondary | ICD-10-CM | POA: Diagnosis not present

## 2015-12-25 DIAGNOSIS — N186 End stage renal disease: Secondary | ICD-10-CM | POA: Diagnosis not present

## 2015-12-25 DIAGNOSIS — D631 Anemia in chronic kidney disease: Secondary | ICD-10-CM | POA: Diagnosis not present

## 2015-12-25 DIAGNOSIS — D509 Iron deficiency anemia, unspecified: Secondary | ICD-10-CM | POA: Diagnosis not present

## 2015-12-25 DIAGNOSIS — N2581 Secondary hyperparathyroidism of renal origin: Secondary | ICD-10-CM | POA: Diagnosis not present

## 2015-12-27 DIAGNOSIS — D509 Iron deficiency anemia, unspecified: Secondary | ICD-10-CM | POA: Diagnosis not present

## 2015-12-27 DIAGNOSIS — N186 End stage renal disease: Secondary | ICD-10-CM | POA: Diagnosis not present

## 2015-12-27 DIAGNOSIS — D631 Anemia in chronic kidney disease: Secondary | ICD-10-CM | POA: Diagnosis not present

## 2015-12-27 DIAGNOSIS — N2581 Secondary hyperparathyroidism of renal origin: Secondary | ICD-10-CM | POA: Diagnosis not present

## 2015-12-29 DIAGNOSIS — N2581 Secondary hyperparathyroidism of renal origin: Secondary | ICD-10-CM | POA: Diagnosis not present

## 2015-12-29 DIAGNOSIS — D509 Iron deficiency anemia, unspecified: Secondary | ICD-10-CM | POA: Diagnosis not present

## 2015-12-29 DIAGNOSIS — N186 End stage renal disease: Secondary | ICD-10-CM | POA: Diagnosis not present

## 2015-12-29 DIAGNOSIS — D631 Anemia in chronic kidney disease: Secondary | ICD-10-CM | POA: Diagnosis not present

## 2015-12-31 ENCOUNTER — Other Ambulatory Visit: Payer: Self-pay | Admitting: *Deleted

## 2015-12-31 ENCOUNTER — Encounter: Payer: Self-pay | Admitting: Internal Medicine

## 2015-12-31 ENCOUNTER — Ambulatory Visit (INDEPENDENT_AMBULATORY_CARE_PROVIDER_SITE_OTHER): Payer: Medicare Other | Admitting: Internal Medicine

## 2015-12-31 VITALS — BP 172/87 | HR 81 | Temp 98.4°F | Ht 71.0 in | Wt 238.1 lb

## 2015-12-31 DIAGNOSIS — B20 Human immunodeficiency virus [HIV] disease: Secondary | ICD-10-CM

## 2015-12-31 DIAGNOSIS — B350 Tinea barbae and tinea capitis: Secondary | ICD-10-CM | POA: Diagnosis not present

## 2015-12-31 DIAGNOSIS — Z992 Dependence on renal dialysis: Secondary | ICD-10-CM

## 2015-12-31 DIAGNOSIS — N186 End stage renal disease: Secondary | ICD-10-CM | POA: Diagnosis not present

## 2015-12-31 DIAGNOSIS — I10 Essential (primary) hypertension: Secondary | ICD-10-CM

## 2015-12-31 MED ORDER — ETRAVIRINE 200 MG PO TABS: 200.0000 mg | ORAL_TABLET | Freq: Two times a day (BID) | ORAL | Status: DC

## 2015-12-31 MED ORDER — RILPIVIRINE HCL 25 MG PO TABS: 25.0000 mg | ORAL_TABLET | Freq: Every day | ORAL | Status: DC

## 2015-12-31 NOTE — Progress Notes (Signed)
Patient ID: Darrell Johnson, male   DOB: 07-30-1965, 50 y.o.   MRN: [id number]       Patient ID: Darrell Johnson, male   DOB: [birth year], 50 y.o.   MRN: [id number]  HPI Darrell Johnson is a 50yo M with hiv disease, ESRD on HD, poorly controlled HTN, currently on DLG/DRVr/TDF, CD 4 count of 570/VL 20 (march 2017). Continues to attend appt through [facility] transplant clinic for evaluation for renal allograft. He is currently on 2 lists, including receiving organ from hiv + donors. Discussion at his last appt with Dr. Rogers Blocker included changing his hiv regimen so that less interaction with transplant immunesuppression.  Otherwise he is doing well. He states that his scalp rash is nearly resolved. He recently had topical and short course of fluconazole by his pcp for tinea capitis.  Outpatient Encounter Prescriptions as of 12/31/2015  Medication Sig  . amLODipine (NORVASC) 5 MG tablet Take 2 tablets (10 mg total) by mouth daily.  . calcitRIOL (ROCALTROL) 0.5 MCG capsule Take 0.5 mcg by mouth daily. Given at dialysis  . carvedilol (COREG) 25 MG tablet TAKE 1 TABLET (25 MG TOTAL) BY MOUTH 2 (TWO) TIMES DAILY WITH A MEAL.  Marland Kitchen dolutegravir (TIVICAY) 50 MG tablet Take 1 tablet (50 mg total) by mouth daily.  Marland Kitchen doxazosin (CARDURA) 8 MG tablet TAKE 1 TABLET (8 MG TOTAL) BY MOUTH AT BEDTIME.  . Febuxostat (ULORIC) 80 MG TABS Take 80 mg by mouth daily.  . furosemide (LASIX) 40 MG tablet TAKE 1 TABLET (40 MG TOTAL) BY MOUTH DAILY.  Marland Kitchen NORVIR 100 MG TABS tablet TAKE 1 TABLET (100MG  TOTAL) BY MOUTH DAILY  . PREZISTA 800 MG tablet TAKE 1 TABLET (800 MG TOTAL) BY MOUTH DAILY WITH BREAKFAST.  Marland Kitchen PREZISTA 800 MG tablet TAKE 1 TABLET BY MOUTH EVERY DAY  . RENVELA 800 MG tablet Take 800 mg by mouth 3 (three) times daily with meals.   . ritonavir (NORVIR) 100 MG TABS tablet TAKE 1 TABLET (100MG  TOTAL) BY MOUTH DAILY  . VIREAD 300 MG tablet TAKE 1 TABLETBY MOUTH ONCE A WEEK  . erythromycin ophthalmic ointment Place a 1/2 inch ribbon  of ointment into the lower eyelid. (Patient not taking: Reported on 10/08/2015)  . fluconazole (DIFLUCAN) 100 MG tablet Take 1 tablet (100 mg total) by mouth daily. (Patient not taking: Reported on 12/31/2015)  . hydrocortisone (ANUSOL-HC) 2.5 % rectal cream Apply rectally 2 times daily (Patient not taking: Reported on 10/08/2015)  . montelukast (SINGULAIR) 10 MG tablet Take 1 tablet (10 mg total) by mouth at bedtime. (Patient not taking: Reported on 10/08/2015)  . sodium bicarbonate 650 MG tablet Take 650 mg by mouth 2 (two) times daily. Reported on 12/31/2015  . triamcinolone cream (KENALOG) 0.1 % Apply 1 application topically 2 (two) times daily. (Patient not taking: Reported on 12/31/2015)   No facility-administered encounter medications on file as of 12/31/2015.     Patient Active Problem List   Diagnosis Date Noted  . Tinea capitis 11/24/2015  . Seasonal allergies   . Chemical injury of eye 09/05/2015  . Maculopapular rash 08/09/2015  . Healthcare maintenance 05/03/2015  . Headache 03/19/2015  . Hemorrhoid 11/30/2014  . AIDS ([place])   . Leukopenia   . Anemia in chronic kidney disease 08/30/2014  . End stage renal disease ([place]) 12/27/2013  . Drug noncompliance 11/07/2013  . History of syphilis 11/07/2013  . Arthritis 09/09/2013  . Tobacco abuse 09/07/2013  . Chronic pain disorder 04/28/2013  . HYPERTRIGLYCERIDEMIA 11/01/2009  .  Gout 08/13/2009  . ERECTILE DYSFUNCTION 06/08/2008  . Human immunodeficiency virus (HIV) disease ([place]) 05/08/2006  . PERIPHERAL NEUROPATHY 05/08/2006  . Essential hypertension 05/08/2006  . SEIZURE DISORDER 05/08/2006     There are no preventive care reminders to display for this patient.   Review of Systems See above. 10 point ros is otherwise negative. Physical Exam   BP 172/87 mmHg  Pulse 81  Temp(Src) 98.4 F (36.9 C) (Oral)  Ht 5\' 11"  (1.803 m)  Wt 238 lb 1.9 oz (108.011 kg)  BMI 33.23 kg/m2 Physical Exam  Constitutional: He is oriented to  person, place, and time. He appears well-developed and well-nourished. No distress.  HENT:  Mouth/Throat: Oropharynx is clear and moist. No oropharyngeal exudate.  Cardiovascular: Normal rate, regular rhythm and normal heart sounds. Exam reveals no gallop and no friction rub.  No murmur heard.  Pulmonary/Chest: Effort normal and breath sounds normal. No respiratory distress. He has no wheezes.  Abdominal: Soft. Bowel sounds are normal. He exhibits no distension. There is no tenderness.  Lymphadenopathy:  He has no cervical adenopathy.  Neurological: He is alert and oriented to person, place, and time.  Skin: Skin is warm and dry. No rash noted. No erythema.  Psychiatric: He has a normal mood and affect. His behavior is normal.    Lab Results  Component Value Date   CD4TCELL 36 09/24/2015   Lab Results  Component Value Date   CD4TABS 570 09/24/2015   CD4TABS 270* 11/22/2014   CD4TABS 230* 08/17/2014   Lab Results  Component Value Date   HIV1RNAQUANT <20 09/24/2015   Lab Results  Component Value Date   HEPBSAB POS* 08/17/2014   No results found for: RPR  CBC Lab Results  Component Value Date   WBC 6.9 09/24/2015   RBC 3.70* 09/24/2015   HGB 10.7* 09/24/2015   HCT 32.2* 09/24/2015   PLT 196 09/24/2015   MCV 87.0 09/24/2015   MCH 28.9 09/24/2015   MCHC 33.2 09/24/2015   RDW 16.3* 09/24/2015   LYMPHSABS 1.4 09/24/2015   MONOABS 0.4 09/24/2015   EOSABS 0.3 09/24/2015   BASOSABS 0.0 09/24/2015   BMET Lab Results  Component Value Date   NA 141 09/24/2015   K 4.3 09/24/2015   CL 94* 09/24/2015   CO2 26 09/24/2015   GLUCOSE 164* 09/24/2015   BUN 39* 09/24/2015   CREATININE 10.32* 09/24/2015   CALCIUM 9.8 09/24/2015   GFRNONAA 12* 11/16/2014   GFRAA 14* 11/16/2014     Assessment and Plan  hiv disease = has been well controlled for the past 10 years with undetectable viral load. Unfortunately, no genotype carried through EMR to track if he had mutations that  led to his current regimen. I suspect in part due to his worsening kidney function that PI based regimen was chosen. He is currently on DLG-DRVr-TDF, renal adjusted. Due to being on transplant list, Dr. Rogers Blocker suggested getting off of PI based regimen to simply risk for drug interaction once he has transplant. Initially thought ETR would be good substitute though it has interaction with DLG. We  will change regimen to RPV daily/ DLG daily/TDF weekly,-- eliminating DRV/r. Will have him come back for VL in 4 wk on new regimen to ensure still having virologic control  ESRD on HD = continue with current HD schedule of tues-thur-sat  HTN = poorly controlled at visit today, he reports that he has not taken his BP meds for which he takes usually in the afternoon. Reminded  him to continue taking daily  Tinea capitis = appears resolved.  rtc in 4 wk

## 2016-01-01 DIAGNOSIS — N186 End stage renal disease: Secondary | ICD-10-CM | POA: Diagnosis not present

## 2016-01-01 DIAGNOSIS — D631 Anemia in chronic kidney disease: Secondary | ICD-10-CM | POA: Diagnosis not present

## 2016-01-01 DIAGNOSIS — D509 Iron deficiency anemia, unspecified: Secondary | ICD-10-CM | POA: Diagnosis not present

## 2016-01-01 DIAGNOSIS — N2581 Secondary hyperparathyroidism of renal origin: Secondary | ICD-10-CM | POA: Diagnosis not present

## 2016-01-03 ENCOUNTER — Other Ambulatory Visit: Payer: Self-pay | Admitting: *Deleted

## 2016-01-03 DIAGNOSIS — D631 Anemia in chronic kidney disease: Secondary | ICD-10-CM | POA: Diagnosis not present

## 2016-01-03 DIAGNOSIS — N2581 Secondary hyperparathyroidism of renal origin: Secondary | ICD-10-CM | POA: Diagnosis not present

## 2016-01-03 DIAGNOSIS — D509 Iron deficiency anemia, unspecified: Secondary | ICD-10-CM | POA: Diagnosis not present

## 2016-01-03 DIAGNOSIS — N186 End stage renal disease: Secondary | ICD-10-CM | POA: Diagnosis not present

## 2016-01-03 DIAGNOSIS — B20 Human immunodeficiency virus [HIV] disease: Secondary | ICD-10-CM

## 2016-01-03 MED ORDER — TENOFOVIR DISOPROXIL FUMARATE 300 MG PO TABS
ORAL_TABLET | ORAL | Status: DC
Start: 2016-01-03 — End: 2017-01-06

## 2016-01-03 MED ORDER — DOLUTEGRAVIR SODIUM 50 MG PO TABS: 50.0000 mg | ORAL_TABLET | Freq: Every day | ORAL | Status: DC

## 2016-01-03 MED ORDER — RILPIVIRINE HCL 25 MG PO TABS: 25.0000 mg | ORAL_TABLET | Freq: Every day | ORAL | Status: DC

## 2016-01-05 DIAGNOSIS — D509 Iron deficiency anemia, unspecified: Secondary | ICD-10-CM | POA: Diagnosis not present

## 2016-01-05 DIAGNOSIS — D631 Anemia in chronic kidney disease: Secondary | ICD-10-CM | POA: Diagnosis not present

## 2016-01-05 DIAGNOSIS — N186 End stage renal disease: Secondary | ICD-10-CM | POA: Diagnosis not present

## 2016-01-05 DIAGNOSIS — N2581 Secondary hyperparathyroidism of renal origin: Secondary | ICD-10-CM | POA: Diagnosis not present

## 2016-01-08 DIAGNOSIS — D509 Iron deficiency anemia, unspecified: Secondary | ICD-10-CM | POA: Diagnosis not present

## 2016-01-08 DIAGNOSIS — D631 Anemia in chronic kidney disease: Secondary | ICD-10-CM | POA: Diagnosis not present

## 2016-01-08 DIAGNOSIS — N2581 Secondary hyperparathyroidism of renal origin: Secondary | ICD-10-CM | POA: Diagnosis not present

## 2016-01-08 DIAGNOSIS — N186 End stage renal disease: Secondary | ICD-10-CM | POA: Diagnosis not present

## 2016-01-10 DIAGNOSIS — D509 Iron deficiency anemia, unspecified: Secondary | ICD-10-CM | POA: Diagnosis not present

## 2016-01-10 DIAGNOSIS — N2581 Secondary hyperparathyroidism of renal origin: Secondary | ICD-10-CM | POA: Diagnosis not present

## 2016-01-10 DIAGNOSIS — D631 Anemia in chronic kidney disease: Secondary | ICD-10-CM | POA: Diagnosis not present

## 2016-01-10 DIAGNOSIS — N186 End stage renal disease: Secondary | ICD-10-CM | POA: Diagnosis not present

## 2016-01-12 DIAGNOSIS — N186 End stage renal disease: Secondary | ICD-10-CM | POA: Diagnosis not present

## 2016-01-12 DIAGNOSIS — D509 Iron deficiency anemia, unspecified: Secondary | ICD-10-CM | POA: Diagnosis not present

## 2016-01-12 DIAGNOSIS — D631 Anemia in chronic kidney disease: Secondary | ICD-10-CM | POA: Diagnosis not present

## 2016-01-12 DIAGNOSIS — N2581 Secondary hyperparathyroidism of renal origin: Secondary | ICD-10-CM | POA: Diagnosis not present

## 2016-01-15 ENCOUNTER — Other Ambulatory Visit: Payer: Self-pay | Admitting: Internal Medicine

## 2016-01-15 ENCOUNTER — Emergency Department (HOSPITAL_COMMUNITY)
Admission: EM | Admit: 2016-01-15 | Discharge: 2016-01-15 | Disposition: A | Discharge status: Home / Self Care | Payer: Medicare Other | Attending: Emergency Medicine | Admitting: Emergency Medicine

## 2016-01-15 ENCOUNTER — Encounter (HOSPITAL_COMMUNITY): Payer: Self-pay

## 2016-01-15 DIAGNOSIS — I132 Hypertensive heart and chronic kidney disease with heart failure and with stage 5 chronic kidney disease, or end stage renal disease: Secondary | ICD-10-CM | POA: Diagnosis not present

## 2016-01-15 DIAGNOSIS — N186 End stage renal disease: Secondary | ICD-10-CM | POA: Insufficient documentation

## 2016-01-15 DIAGNOSIS — M25551 Pain in right hip: Secondary | ICD-10-CM

## 2016-01-15 DIAGNOSIS — Y999 Unspecified external cause status: Secondary | ICD-10-CM | POA: Insufficient documentation

## 2016-01-15 DIAGNOSIS — I509 Heart failure, unspecified: Secondary | ICD-10-CM | POA: Insufficient documentation

## 2016-01-15 DIAGNOSIS — Y939 Activity, unspecified: Secondary | ICD-10-CM | POA: Insufficient documentation

## 2016-01-15 DIAGNOSIS — Z992 Dependence on renal dialysis: Secondary | ICD-10-CM | POA: Diagnosis not present

## 2016-01-15 DIAGNOSIS — Y929 Unspecified place or not applicable: Secondary | ICD-10-CM | POA: Diagnosis not present

## 2016-01-15 DIAGNOSIS — X509XXA Other and unspecified overexertion or strenuous movements or postures, initial encounter: Secondary | ICD-10-CM | POA: Insufficient documentation

## 2016-01-15 DIAGNOSIS — F17210 Nicotine dependence, cigarettes, uncomplicated: Secondary | ICD-10-CM | POA: Diagnosis not present

## 2016-01-15 MED ORDER — PREDNISONE 5 MG PO TABS: 5.0000 mg | ORAL_TABLET | Freq: Every day | ORAL | Status: DC

## 2016-01-15 MED ORDER — HYDROCODONE-ACETAMINOPHEN 5-325 MG PO TABS: 1.0000 | ORAL_TABLET | ORAL | Status: DC | PRN

## 2016-01-15 MED ORDER — METHOCARBAMOL 500 MG PO TABS: 500.0000 mg | ORAL_TABLET | Freq: Two times a day (BID) | ORAL | Status: DC

## 2016-01-15 MED ORDER — METHOCARBAMOL 500 MG PO TABS
500.0000 mg | ORAL_TABLET | Freq: Once | ORAL | Status: AC
  Administered 2016-01-15: 500 mg via ORAL
  Filled 2016-01-15: qty 1

## 2016-01-15 MED ORDER — HYDROCODONE-ACETAMINOPHEN 5-325 MG PO TABS
1.0000 | ORAL_TABLET | Freq: Once | ORAL | Status: AC
  Administered 2016-01-15: 1 via ORAL
  Filled 2016-01-15: qty 1

## 2016-01-15 MED ORDER — KETOROLAC TROMETHAMINE 30 MG/ML IJ SOLN: 30.0000 mg | Freq: Once | INTRAMUSCULAR | Status: DC

## 2016-01-15 MED ORDER — ACETAMINOPHEN 325 MG PO TABS: 650.0000 mg | ORAL_TABLET | Freq: Once | ORAL | Status: DC

## 2016-01-15 NOTE — ED Notes (Addendum)
Patient complains of right hip pain x 1 week, denies trauma. Pain worse with any ambulation. Thinks sprained while moving furniture. Scheduled for dialysis today

## 2016-01-15 NOTE — Discharge Instructions (Signed)

## 2016-01-15 NOTE — ED Provider Notes (Signed)
CSN: [id number]     Arrival date & time 01/15/16  1016 History   First MD Initiated Contact with Patient 01/15/16 1022     Chief Complaint  Patient presents with  . Hip Pain    HPI  Darrell Johnson is an 50 y.o. male with ESRD, HTN, HLD, HIV who presents to the ED for evaluation of right hip pain. He states he has had the pain for the past week or two. He thinks it started after moving some heavy furniture. He states he has no new weakness, numbness, or tingling. States he is still able to walk fine though it is painful. The pain he describes as constant sharp pain from his anterolateral right hip through his lateral mid right thigh. He has tried OTC pain patches with no relief. He states he is waiting for a renal transplant and needs to avoid NSAIDs. He has not tried any other medications for relief.  Past Medical History  Diagnosis Date  . 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 Hypertension   . Seizures ([place])     last seizure was >5 years ago, pt has family history of seizures  . Syphilis 1997    history of syphilis 1997  . Chronic kidney disease     stage 3-4 CKD, followed by Dr. Moshe Cipro  . 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 Rib fractures 01/2009  . HIV infection ([place]) 1980's    on ART therapy since, followed by ID clinic, complicated  by neuropathy  . 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 Pneumonia   . Arthritis   . Anemia   . HYPERTENSION 05/08/2006  . Gout, unspecified 08/13/2009    Qualifier: Diagnosis of  By: Redmond Pulling  MD, Mateo Flow    . CHF (congestive heart failure) [facility])    Past Surgical History  Procedure Laterality Date  . Abscess drainage    . Av fistula placement Right 12/02/2013    Procedure: ARTERIOVENOUS (AV) FISTULA CREATION;  Surgeon: Rosetta Posner, MD;  Location: [facility] OR;   Service: Vascular;  Laterality: Right;   Family History  Problem Relation Age of Onset  . Cancer Mother   . COPD Father   . Diabetes Sister   . Hypertension Sister   . Diabetes Brother   . Hypertension Brother   . Stroke Neg Hx    Social History  Substance Use Topics  . Smoking status: Current Every Day Smoker -- 0.25 packs/day for 20 years    Types: Cigarettes    Last Attempt to Quit: 09/26/2014  . Smokeless tobacco: Never Used     Comment: Trying to quit.  . Alcohol Use: No    Review of Systems  All other systems reviewed and are negative.     Allergies  Review of patient's allergies indicates no known allergies.  Home Medications   Prior to Admission medications   Medication Sig Start Date End Date Taking? Authorizing Provider  amLODipine (NORVASC) 5 MG tablet Take 2 tablets (10 mg total) by mouth daily. 08/09/15 08/08/16  Jule Ser, DO  calcitRIOL (ROCALTROL) 0.5 MCG capsule Take 0.5 mcg by mouth daily. Given at dialysis    Historical Provider, MD  carvedilol (COREG) 25 MG tablet TAKE 1 TABLET (25 MG TOTAL) BY MOUTH 2 (TWO) TIMES DAILY WITH A  MEAL. 10/10/15   Jule Ser, DO  dolutegravir (TIVICAY) 50 MG tablet Take 1 tablet (50 mg total) by mouth daily. 01/03/16   Carlyle Basques, MD  doxazosin (CARDURA) 8 MG tablet TAKE 1 TABLET (8 MG TOTAL) BY MOUTH AT BEDTIME. 10/09/15   Jule Ser, DO  erythromycin ophthalmic ointment Place a 1/2 inch ribbon of ointment into the lower eyelid. Patient not taking: Reported on 10/08/2015 09/02/15   [place] Lions, PA-C  Febuxostat (ULORIC) 80 MG TABS Take 80 mg by mouth daily.    Historical Provider, MD  furosemide (LASIX) 40 MG tablet TAKE 1 TABLET (40 MG TOTAL) BY MOUTH DAILY. 09/10/15   Sid Falcon, MD  hydrocortisone (ANUSOL-HC) 2.5 % rectal cream Apply rectally 2 times daily Patient not taking: Reported on 10/08/2015 11/30/14   Dellia Nims, MD  montelukast (SINGULAIR) 10 MG tablet Take 1 tablet (10 mg total) by  mouth at bedtime. Patient not taking: Reported on 10/08/2015 10/02/15   Carlyle Basques, MD  RENVELA 800 MG tablet Take 800 mg by mouth 3 (three) times daily with meals.  11/21/14   Historical Provider, MD  rilpivirine (EDURANT) 25 MG TABS tablet Take 1 tablet (25 mg total) by mouth daily with breakfast. 01/03/16   Carlyle Basques, MD  sodium bicarbonate 650 MG tablet Take 650 mg by mouth 2 (two) times daily. Reported on 12/31/2015 10/16/14   Historical Provider, MD  tenofovir (VIREAD) 300 MG tablet TAKE 1 TABLET BY MOUTH ONCE A WEEK 01/03/16   Carlyle Basques, MD  triamcinolone cream (KENALOG) 0.1 % Apply 1 application topically 2 (two) times daily. Patient not taking: Reported on 12/31/2015 11/22/15   Jule Ser, DO   BP 187/89 mmHg  Pulse 83  Temp(Src) 97.7 F (36.5 C)  Resp 18  SpO2 97% Physical Exam  Constitutional: He is oriented to person, place, and time. No distress.  HENT:  Head: Atraumatic.  Right Ear: External ear normal.  Left Ear: External ear normal.  Nose: Nose normal.  Eyes: Conjunctivae are normal. No scleral icterus.  Neck: Normal range of motion. Neck supple.  Cardiovascular: Normal rate and regular rhythm.   Pulmonary/Chest: Effort normal. No respiratory distress.  Abdominal: Soft. He exhibits no distension. There is no tenderness.  Musculoskeletal:  Diffuse tenderness and spasm to right lateral hip and right anterior hip. Diffuse tenderness through right lateral thigh. Full active and passive ROM of hip and knee though pt states is painful and feels like pulling along his right lateral thigh. 2+ dp bilaterally  Neurological: He is alert and oriented to person, place, and time.  Skin: Skin is warm and dry. He is not diaphoretic.  Psychiatric: He has a normal mood and affect. His behavior is normal.  Nursing note and vitals reviewed.   ED Course  Procedures (including critical care time) Labs Review Labs Reviewed - No data to display  Imaging Review No results  found. I have personally reviewed and evaluated these images and lab results as part of my medical decision-making.   EKG Interpretation None      MDM   Final diagnoses:  Right hip pain    Pt is an 50 y.o. male with right hip and thigh pain for the past week or two. Likely started after moving heavy furniture. Denies fall or trauma. His exam is reassuring with FROM of his hip though he does have some soft tissue tenderness and spasm. Perhaps involvement of IT band. He is neurovascularly intact and able to ambulate with  steady gait. He would like to avoid NSAIDs as he is hoping for a kidney transplant. Pain improved with norco and robaxin in the ED. Rx given for same. Also low dose short burst of steroids per pt request. Instructed f/u with ortho. ER return precautions given.    Anne Ng, PA-C 01/16/16 [place], MD 01/17/16 [phone]

## 2016-01-15 NOTE — ED Notes (Signed)
Patient ambulated to restroom and tolerated well.  

## 2016-01-16 ENCOUNTER — Ambulatory Visit: Payer: Medicare Other | Admitting: Internal Medicine

## 2016-01-16 ENCOUNTER — Encounter: Payer: Self-pay | Admitting: Internal Medicine

## 2016-01-16 DIAGNOSIS — N186 End stage renal disease: Secondary | ICD-10-CM | POA: Diagnosis not present

## 2016-01-16 DIAGNOSIS — D509 Iron deficiency anemia, unspecified: Secondary | ICD-10-CM | POA: Diagnosis not present

## 2016-01-16 DIAGNOSIS — D631 Anemia in chronic kidney disease: Secondary | ICD-10-CM | POA: Diagnosis not present

## 2016-01-16 DIAGNOSIS — N2581 Secondary hyperparathyroidism of renal origin: Secondary | ICD-10-CM | POA: Diagnosis not present

## 2016-01-17 DIAGNOSIS — N186 End stage renal disease: Secondary | ICD-10-CM | POA: Diagnosis not present

## 2016-01-17 DIAGNOSIS — M1009 Idiopathic gout, multiple sites: Secondary | ICD-10-CM | POA: Diagnosis not present

## 2016-01-17 DIAGNOSIS — Z21 Asymptomatic human immunodeficiency virus [HIV] infection status: Secondary | ICD-10-CM | POA: Diagnosis not present

## 2016-01-17 DIAGNOSIS — N2581 Secondary hyperparathyroidism of renal origin: Secondary | ICD-10-CM | POA: Diagnosis not present

## 2016-01-17 DIAGNOSIS — D509 Iron deficiency anemia, unspecified: Secondary | ICD-10-CM | POA: Diagnosis not present

## 2016-01-17 DIAGNOSIS — M2550 Pain in unspecified joint: Secondary | ICD-10-CM | POA: Diagnosis not present

## 2016-01-17 DIAGNOSIS — M150 Primary generalized (osteo)arthritis: Secondary | ICD-10-CM | POA: Diagnosis not present

## 2016-01-17 DIAGNOSIS — D631 Anemia in chronic kidney disease: Secondary | ICD-10-CM | POA: Diagnosis not present

## 2016-01-17 DIAGNOSIS — M5416 Radiculopathy, lumbar region: Secondary | ICD-10-CM | POA: Diagnosis not present

## 2016-01-18 DIAGNOSIS — N186 End stage renal disease: Secondary | ICD-10-CM | POA: Diagnosis not present

## 2016-01-18 DIAGNOSIS — Z992 Dependence on renal dialysis: Secondary | ICD-10-CM | POA: Diagnosis not present

## 2016-01-18 DIAGNOSIS — I120 Hypertensive chronic kidney disease with stage 5 chronic kidney disease or end stage renal disease: Secondary | ICD-10-CM | POA: Diagnosis not present

## 2016-01-19 DIAGNOSIS — N186 End stage renal disease: Secondary | ICD-10-CM | POA: Diagnosis not present

## 2016-01-19 DIAGNOSIS — N2581 Secondary hyperparathyroidism of renal origin: Secondary | ICD-10-CM | POA: Diagnosis not present

## 2016-01-19 DIAGNOSIS — D509 Iron deficiency anemia, unspecified: Secondary | ICD-10-CM | POA: Diagnosis not present

## 2016-01-19 DIAGNOSIS — D631 Anemia in chronic kidney disease: Secondary | ICD-10-CM | POA: Diagnosis not present

## 2016-01-22 DIAGNOSIS — N2581 Secondary hyperparathyroidism of renal origin: Secondary | ICD-10-CM | POA: Diagnosis not present

## 2016-01-22 DIAGNOSIS — D631 Anemia in chronic kidney disease: Secondary | ICD-10-CM | POA: Diagnosis not present

## 2016-01-22 DIAGNOSIS — N186 End stage renal disease: Secondary | ICD-10-CM | POA: Diagnosis not present

## 2016-01-22 DIAGNOSIS — D509 Iron deficiency anemia, unspecified: Secondary | ICD-10-CM | POA: Diagnosis not present

## 2016-01-24 DIAGNOSIS — N186 End stage renal disease: Secondary | ICD-10-CM | POA: Diagnosis not present

## 2016-01-24 DIAGNOSIS — N2581 Secondary hyperparathyroidism of renal origin: Secondary | ICD-10-CM | POA: Diagnosis not present

## 2016-01-24 DIAGNOSIS — D509 Iron deficiency anemia, unspecified: Secondary | ICD-10-CM | POA: Diagnosis not present

## 2016-01-24 DIAGNOSIS — D631 Anemia in chronic kidney disease: Secondary | ICD-10-CM | POA: Diagnosis not present

## 2016-01-26 DIAGNOSIS — D631 Anemia in chronic kidney disease: Secondary | ICD-10-CM | POA: Diagnosis not present

## 2016-01-26 DIAGNOSIS — N186 End stage renal disease: Secondary | ICD-10-CM | POA: Diagnosis not present

## 2016-01-26 DIAGNOSIS — N2581 Secondary hyperparathyroidism of renal origin: Secondary | ICD-10-CM | POA: Diagnosis not present

## 2016-01-26 DIAGNOSIS — D509 Iron deficiency anemia, unspecified: Secondary | ICD-10-CM | POA: Diagnosis not present

## 2016-01-28 ENCOUNTER — Inpatient Hospital Stay (HOSPITAL_COMMUNITY)
Admission: EM | Admit: 2016-01-28 | Discharge: 2016-01-29 | DRG: 189 | Disposition: A | Discharge status: Home / Self Care | Payer: Medicare Other | Attending: Internal Medicine | Admitting: Internal Medicine

## 2016-01-28 ENCOUNTER — Emergency Department (HOSPITAL_COMMUNITY): Payer: Medicare Other

## 2016-01-28 ENCOUNTER — Other Ambulatory Visit: Payer: Self-pay

## 2016-01-28 ENCOUNTER — Encounter (HOSPITAL_COMMUNITY): Payer: Self-pay | Admitting: Emergency Medicine

## 2016-01-28 ENCOUNTER — Other Ambulatory Visit: Payer: Medicare Other

## 2016-01-28 DIAGNOSIS — Z992 Dependence on renal dialysis: Secondary | ICD-10-CM

## 2016-01-28 DIAGNOSIS — B20 Human immunodeficiency virus [HIV] disease: Secondary | ICD-10-CM | POA: Diagnosis present

## 2016-01-28 DIAGNOSIS — I10 Essential (primary) hypertension: Secondary | ICD-10-CM | POA: Diagnosis not present

## 2016-01-28 DIAGNOSIS — I169 Hypertensive crisis, unspecified: Secondary | ICD-10-CM | POA: Diagnosis present

## 2016-01-28 DIAGNOSIS — J9600 Acute respiratory failure, unspecified whether with hypoxia or hypercapnia: Secondary | ICD-10-CM | POA: Diagnosis not present

## 2016-01-28 DIAGNOSIS — M1A9XX0 Chronic gout, unspecified, without tophus (tophi): Secondary | ICD-10-CM | POA: Diagnosis present

## 2016-01-28 DIAGNOSIS — I120 Hypertensive chronic kidney disease with stage 5 chronic kidney disease or end stage renal disease: Secondary | ICD-10-CM | POA: Diagnosis present

## 2016-01-28 DIAGNOSIS — E669 Obesity, unspecified: Secondary | ICD-10-CM | POA: Diagnosis present

## 2016-01-28 DIAGNOSIS — F17200 Nicotine dependence, unspecified, uncomplicated: Secondary | ICD-10-CM | POA: Diagnosis present

## 2016-01-28 DIAGNOSIS — Z6833 Body mass index (BMI) 33.0-33.9, adult: Secondary | ICD-10-CM | POA: Diagnosis not present

## 2016-01-28 DIAGNOSIS — R0600 Dyspnea, unspecified: Secondary | ICD-10-CM | POA: Diagnosis not present

## 2016-01-28 DIAGNOSIS — J810 Acute pulmonary edema: Secondary | ICD-10-CM | POA: Diagnosis not present

## 2016-01-28 DIAGNOSIS — N186 End stage renal disease: Secondary | ICD-10-CM | POA: Diagnosis not present

## 2016-01-28 DIAGNOSIS — G40909 Epilepsy, unspecified, not intractable, without status epilepticus: Secondary | ICD-10-CM | POA: Diagnosis present

## 2016-01-28 DIAGNOSIS — Z79899 Other long term (current) drug therapy: Secondary | ICD-10-CM

## 2016-01-28 DIAGNOSIS — R0603 Acute respiratory distress: Secondary | ICD-10-CM

## 2016-01-28 DIAGNOSIS — J9601 Acute respiratory failure with hypoxia: Secondary | ICD-10-CM | POA: Diagnosis not present

## 2016-01-28 DIAGNOSIS — Z720 Tobacco use: Secondary | ICD-10-CM | POA: Diagnosis present

## 2016-01-28 DIAGNOSIS — R0602 Shortness of breath: Secondary | ICD-10-CM | POA: Diagnosis not present

## 2016-01-28 DIAGNOSIS — I160 Hypertensive urgency: Secondary | ICD-10-CM | POA: Diagnosis present

## 2016-01-28 DIAGNOSIS — M5416 Radiculopathy, lumbar region: Secondary | ICD-10-CM | POA: Diagnosis present

## 2016-01-28 DIAGNOSIS — M109 Gout, unspecified: Secondary | ICD-10-CM | POA: Diagnosis present

## 2016-01-28 HISTORY — DX: Tobacco use: Z72.0

## 2016-01-28 HISTORY — DX: Influenza due to other identified influenza virus with other respiratory manifestations: J10.1

## 2016-01-28 LAB — CBC WITH DIFFERENTIAL/PLATELET
BASOS ABS: 0 10*3/uL (ref 0.0–0.1)
BLASTS: 0 %
Band Neutrophils: 0 %
Basophils Relative: 0 %
Eosinophils Absolute: 0.7 10*3/uL (ref 0.0–0.7)
Eosinophils Relative: 2 %
HCT: 41.5 % (ref 39.0–52.0)
HEMOGLOBIN: 13.3 g/dL (ref 13.0–17.0)
Lymphocytes Relative: 28 %
Lymphs Abs: 10.5 10*3/uL — ABNORMAL HIGH (ref 0.7–4.0)
MCH: 29.6 pg (ref 26.0–34.0)
MCHC: 32 g/dL (ref 30.0–36.0)
MCV: 92.2 fL (ref 78.0–100.0)
METAMYELOCYTES PCT: 0 %
MONOS PCT: 7 %
MYELOCYTES: 0 %
Monocytes Absolute: 2.6 10*3/uL — ABNORMAL HIGH (ref 0.1–1.0)
NEUTROS PCT: 63 %
NRBC: 0 /100{WBCs}
Neutro Abs: 23.6 10*3/uL — ABNORMAL HIGH (ref 1.7–7.7)
Other: 0 %
Platelets: 347 10*3/uL (ref 150–400)
Promyelocytes Absolute: 0 %
RBC: 4.5 MIL/uL (ref 4.22–5.81)
RDW: 17.1 % — ABNORMAL HIGH (ref 11.5–15.5)
WBC: 37.4 10*3/uL — AB (ref 4.0–10.5)

## 2016-01-28 LAB — MRSA PCR SCREENING: MRSA BY PCR: NEGATIVE

## 2016-01-28 LAB — I-STAT TROPONIN, ED: Troponin i, poc: 0.03 ng/mL (ref 0.00–0.08)

## 2016-01-28 LAB — I-STAT VENOUS BLOOD GAS, ED
Acid-base deficit: 5 mmol/L — ABNORMAL HIGH (ref 0.0–2.0)
BICARBONATE: 26 meq/L — AB (ref 20.0–24.0)
O2 Saturation: 84 %
PH VEN: 7.15 — AB (ref 7.250–7.300)
TCO2: 28 mmol/L (ref 0–100)
pCO2, Ven: 74.8 mmHg (ref 45.0–50.0)
pO2, Ven: 64 mmHg — ABNORMAL HIGH (ref 31.0–45.0)

## 2016-01-28 LAB — TROPONIN I
Troponin I: 0.05 ng/mL (ref <0.03)
Troponin I: 0.07 ng/mL (ref <0.03)
Troponin I: 0.08 ng/mL (ref <0.03)

## 2016-01-28 LAB — I-STAT CG4 LACTIC ACID, ED: Lactic Acid, Venous: 0.84 mmol/L (ref 0.5–1.9)

## 2016-01-28 LAB — BASIC METABOLIC PANEL
Anion gap: 15 (ref 5–15)
BUN: 84 mg/dL — AB (ref 6–20)
CHLORIDE: 103 mmol/L (ref 101–111)
CO2: 24 mmol/L (ref 22–32)
Calcium: 8.8 mg/dL — ABNORMAL LOW (ref 8.9–10.3)
Creatinine, Ser: 10.97 mg/dL — ABNORMAL HIGH (ref 0.61–1.24)
GFR calc Af Amer: 6 mL/min — ABNORMAL LOW (ref >60)
GFR calc non Af Amer: 5 mL/min — ABNORMAL LOW (ref >60)
GLUCOSE: 145 mg/dL — AB (ref 65–99)
POTASSIUM: 4.6 mmol/L (ref 3.5–5.1)
Sodium: 142 mmol/L (ref 135–145)

## 2016-01-28 LAB — I-STAT CHEM 8, ED
BUN: 93 mg/dL — AB (ref 6–20)
CALCIUM ION: 1.13 mmol/L (ref 1.13–1.30)
CHLORIDE: 104 mmol/L (ref 101–111)
CREATININE: 10.3 mg/dL — AB (ref 0.61–1.24)
GLUCOSE: 144 mg/dL — AB (ref 65–99)
HCT: 43 % (ref 39.0–52.0)
Hemoglobin: 14.6 g/dL (ref 13.0–17.0)
Potassium: 4.6 mmol/L (ref 3.5–5.1)
SODIUM: 142 mmol/L (ref 135–145)
TCO2: 31 mmol/L (ref 0–100)

## 2016-01-28 LAB — BRAIN NATRIURETIC PEPTIDE: B Natriuretic Peptide: 770.4 pg/mL — ABNORMAL HIGH (ref 0.0–100.0)

## 2016-01-28 LAB — LACTIC ACID, PLASMA: LACTIC ACID, VENOUS: 0.7 mmol/L (ref 0.5–1.9)

## 2016-01-28 MED ORDER — FEBUXOSTAT 40 MG PO TABS
80.0000 mg | ORAL_TABLET | Freq: Every day | ORAL | Status: DC
  Administered 2016-01-28 – 2016-01-29 (×2): 80 mg via ORAL
  Filled 2016-01-28 (×2): qty 2

## 2016-01-28 MED ORDER — CARVEDILOL 25 MG PO TABS
25.0000 mg | ORAL_TABLET | Freq: Two times a day (BID) | ORAL | Status: DC
  Administered 2016-01-28 – 2016-01-29 (×3): 25 mg via ORAL
  Filled 2016-01-28 (×3): qty 1

## 2016-01-28 MED ORDER — ALBUTEROL (5 MG/ML) CONTINUOUS INHALATION SOLN
10.0000 mg/h | INHALATION_SOLUTION | Freq: Once | RESPIRATORY_TRACT | Status: AC
  Administered 2016-01-28: 10 mg/h via RESPIRATORY_TRACT
  Filled 2016-01-28: qty 20

## 2016-01-28 MED ORDER — SODIUM CHLORIDE 0.9% FLUSH
3.0000 mL | Freq: Two times a day (BID) | INTRAVENOUS | Status: DC
  Administered 2016-01-28 – 2016-01-29 (×2): 3 mL via INTRAVENOUS

## 2016-01-28 MED ORDER — AMLODIPINE BESYLATE 10 MG PO TABS
10.0000 mg | ORAL_TABLET | Freq: Every day | ORAL | Status: DC
  Administered 2016-01-28 – 2016-01-29 (×2): 10 mg via ORAL
  Filled 2016-01-28 (×2): qty 1

## 2016-01-28 MED ORDER — LIDOCAINE HCL (PF) 1 % IJ SOLN
5.0000 mL | INTRAMUSCULAR | Status: DC | PRN
  Filled 2016-01-28: qty 5

## 2016-01-28 MED ORDER — ACETAMINOPHEN 325 MG PO TABS
650.0000 mg | ORAL_TABLET | Freq: Four times a day (QID) | ORAL | Status: DC | PRN
  Administered 2016-01-28: 650 mg via ORAL
  Filled 2016-01-28: qty 2

## 2016-01-28 MED ORDER — DOLUTEGRAVIR SODIUM 50 MG PO TABS
50.0000 mg | ORAL_TABLET | Freq: Every day | ORAL | Status: DC
  Administered 2016-01-28 – 2016-01-29 (×2): 50 mg via ORAL
  Filled 2016-01-28 (×2): qty 1

## 2016-01-28 MED ORDER — LIDOCAINE-PRILOCAINE 2.5-2.5 % EX CREA: 1.0000 "application " | TOPICAL_CREAM | CUTANEOUS | Status: DC | PRN

## 2016-01-28 MED ORDER — ALBUTEROL SULFATE (2.5 MG/3ML) 0.083% IN NEBU: 2.5000 mg | INHALATION_SOLUTION | RESPIRATORY_TRACT | Status: DC | PRN

## 2016-01-28 MED ORDER — MONTELUKAST SODIUM 10 MG PO TABS
10.0000 mg | ORAL_TABLET | Freq: Every day | ORAL | Status: DC
  Administered 2016-01-28: 10 mg via ORAL
  Filled 2016-01-28: qty 1

## 2016-01-28 MED ORDER — TENOFOVIR DISOPROXIL FUMARATE 300 MG PO TABS: 300.0000 mg | ORAL_TABLET | ORAL | Status: DC

## 2016-01-28 MED ORDER — NITROGLYCERIN 2 % TD OINT
1.0000 [in_us] | TOPICAL_OINTMENT | Freq: Once | TRANSDERMAL | Status: AC
  Administered 2016-01-28: 1 [in_us] via TOPICAL
  Filled 2016-01-28: qty 1

## 2016-01-28 MED ORDER — HEPARIN SODIUM (PORCINE) 5000 UNIT/ML IJ SOLN
5000.0000 [IU] | Freq: Three times a day (TID) | INTRAMUSCULAR | Status: DC
  Administered 2016-01-28 – 2016-01-29 (×3): 5000 [IU] via SUBCUTANEOUS
  Filled 2016-01-28 (×3): qty 1

## 2016-01-28 MED ORDER — SODIUM CHLORIDE 0.9% FLUSH
3.0000 mL | Freq: Two times a day (BID) | INTRAVENOUS | Status: DC
  Administered 2016-01-29: 3 mL via INTRAVENOUS

## 2016-01-28 MED ORDER — HEPARIN SODIUM (PORCINE) 1000 UNIT/ML DIALYSIS: 1000.0000 [IU] | INTRAMUSCULAR | Status: DC | PRN

## 2016-01-28 MED ORDER — FUROSEMIDE 10 MG/ML IJ SOLN
40.0000 mg | Freq: Once | INTRAMUSCULAR | Status: AC
  Administered 2016-01-28: 40 mg via INTRAVENOUS
  Filled 2016-01-28: qty 4

## 2016-01-28 MED ORDER — ALTEPLASE 2 MG IJ SOLR: 2.0000 mg | Freq: Once | INTRAMUSCULAR | Status: DC | PRN

## 2016-01-28 MED ORDER — PENTAFLUOROPROP-TETRAFLUOROETH EX AERO: 1.0000 "application " | INHALATION_SPRAY | CUTANEOUS | Status: DC | PRN

## 2016-01-28 MED ORDER — HEPARIN SODIUM (PORCINE) 1000 UNIT/ML DIALYSIS: 2000.0000 [IU] | INTRAMUSCULAR | Status: DC | PRN

## 2016-01-28 MED ORDER — HYDRALAZINE HCL 20 MG/ML IJ SOLN
10.0000 mg | Freq: Once | INTRAMUSCULAR | Status: DC
  Filled 2016-01-28: qty 1

## 2016-01-28 MED ORDER — SODIUM CHLORIDE 0.9 % IV SOLN: 250.0000 mL | INTRAVENOUS | Status: DC | PRN

## 2016-01-28 MED ORDER — SODIUM CHLORIDE 0.9 % IV SOLN: 100.0000 mL | INTRAVENOUS | Status: DC | PRN

## 2016-01-28 MED ORDER — SODIUM CHLORIDE 0.9% FLUSH: 3.0000 mL | INTRAVENOUS | Status: DC | PRN

## 2016-01-28 MED ORDER — DOXAZOSIN MESYLATE 8 MG PO TABS
8.0000 mg | ORAL_TABLET | Freq: Every day | ORAL | Status: DC
  Administered 2016-01-28: 8 mg via ORAL
  Filled 2016-01-28 (×2): qty 1

## 2016-01-28 MED ORDER — ACETAMINOPHEN 650 MG RE SUPP
650.0000 mg | Freq: Four times a day (QID) | RECTAL | Status: DC | PRN
  Filled 2016-01-28: qty 1

## 2016-01-28 MED ORDER — SODIUM BICARBONATE 650 MG PO TABS
650.0000 mg | ORAL_TABLET | Freq: Two times a day (BID) | ORAL | Status: DC
  Administered 2016-01-28 – 2016-01-29 (×3): 650 mg via ORAL
  Filled 2016-01-28 (×3): qty 1

## 2016-01-28 MED ORDER — RILPIVIRINE HCL 25 MG PO TABS
25.0000 mg | ORAL_TABLET | Freq: Every day | ORAL | Status: DC
  Administered 2016-01-28 – 2016-01-29 (×2): 25 mg via ORAL
  Filled 2016-01-28 (×3): qty 1

## 2016-01-28 MED ORDER — SEVELAMER CARBONATE 800 MG PO TABS
800.0000 mg | ORAL_TABLET | Freq: Three times a day (TID) | ORAL | Status: DC
  Administered 2016-01-28 – 2016-01-29 (×4): 800 mg via ORAL
  Filled 2016-01-28 (×4): qty 1

## 2016-01-28 MED ORDER — HYDROCODONE-ACETAMINOPHEN 5-325 MG PO TABS
1.0000 | ORAL_TABLET | ORAL | Status: DC | PRN
  Administered 2016-01-28 – 2016-01-29 (×4): 1 via ORAL
  Filled 2016-01-28 (×4): qty 1

## 2016-01-28 MED ORDER — FUROSEMIDE 40 MG PO TABS
40.0000 mg | ORAL_TABLET | Freq: Every day | ORAL | Status: DC
  Administered 2016-01-29: 40 mg via ORAL
  Filled 2016-01-28: qty 1

## 2016-01-28 NOTE — Consult Note (Signed)
Renal Service Consult Note [facility]  Darrell Johnson 01/28/2016 Sol Blazing Requesting Physician: Dr Lynnae January   Reason for Consult:  ESRD pt w resp distress HPI: The patient is a 50 y.o. year-old with hx of HIV, syphilis, gout, seziure d/o, and ESRD on HD since mid 2016.  TTS schedule, has not missed any HD.  Presented to ED with resp distress, CXR showing bilat infiltrates prob edema, can't r/o PNA.  WBC very high as well.  Patient on bipap and breathing much better per ED MD.  Asked to see for HD.    Minimal history given need for bipap.  No abd pain, n/v/d, no prod cough , no f/c/s.  Has a "boil" in R groin, drained recently.  No leg ulcers. +"sinus" problems.     ROS  denies CP  no joint pain   no HA  no blurry vision  no rash  no diarrhea  no nausea/ vomiting     Past Medical History  Past Medical History  Diagnosis Date  . HIV (human immunodeficiency virus infection) ([place])   . Gout   . HTN (hypertension)   . ESRD (end stage renal disease) ([place])   . Tobacco abuse    Past Surgical History History reviewed. No pertinent past surgical history. Family History  Family History  Problem Relation Age of Onset  . Hypertension Mother   . Hypertension Father    Social History  has no tobacco, alcohol, and drug history on file. Allergies No Known Allergies Home medications Prior to Admission medications   Not on File   Liver Function Tests No results for input(s): AST, ALT, ALKPHOS, BILITOT, PROT, ALBUMIN in the last 168 hours. No results for input(s): LIPASE, AMYLASE in the last 168 hours. CBC  Recent Labs Lab 01/28/16 0624 01/28/16 0631  WBC 37.4*  --   NEUTROABS 23.6*  --   HGB 13.3 14.6  HCT 41.5 43.0  MCV 92.2  --   PLT 347  --    Basic Metabolic Panel  Recent Labs Lab 01/28/16 0624 01/28/16 0631  NA 142 142  K 4.6 4.6  CL 103 104  CO2 24  --   GLUCOSE 145* 144*  BUN 84* 93*  CREATININE 10.97* 10.30*  CALCIUM 8.8*  --     Iron/TIBC/Ferritin/ %Sat No results found for: IRON, TIBC, FERRITIN, IRONPCTSAT  Filed Vitals:   01/28/16 0745 01/28/16 0811 01/28/16 0824 01/28/16 0830  BP:  156/88 171/92 166/91  Pulse: 99 89 86 88  Temp:   98.4 F (36.9 C)   TempSrc:   Axillary   Resp:  18 16   SpO2: 100% 100% 100%    Exam Gen large obese AAM , on bipap, breathing comfortably No rash, cyanosis or gangrene Sclera anicteric, throat clear  No jvd or bruits Chest some basilar rales, o/w clear RRR no MRG Abd soft ntnd no mass or ascites +bs GU R groin small area induration 1 x 1cm, nonfluctuant MS no joint effusions or deformity Ext no LE edema / no wounds or ulcers Neuro is alert, Ox 3 , nf R forearm AVF +bruit    Dialysis: [facility]   4h 98min  107.5kg  2/2.5 bath   Hep 6000/ 3000 mid Rx prn   RFA AVF venofer 50/wk, mircera 75 q 4 last 7/6, dialyvite, renvela 3ac Amlodipine 10/d, Cardura 8 hs, Coreg 25 bid  Assessment: 1.  Resp distress/ acute resp failure - pulm edema on CXR.  Is on  HD now, max UF 2.  ESRD usual HD TTS 3.  HIV 4.  HTN on 3 bp meds 5.  ^^WBC - doesn't appear infected clinically, afeb, ?demargination   Plan - acute HD in progress, max UF 4-5 L  Kelly Splinter MD [facility] pager [phone]    cell [phone] 01/28/2016, 8:41 AM

## 2016-01-28 NOTE — ED Notes (Signed)
Patient arrives from home, severe respiratory distress, oxygen sats in the 80's on CPAP.  No pain, last dialysis was Saturday.  Patient does not have history of CHF or COPD.  It was sudden onset of shortness of breath, woke him from sleep.  Patient states that this has happened to him before.

## 2016-01-28 NOTE — H&P (Signed)
Date: 01/28/2016               Patient Name:  Darrell Johnson MRN: [id number]  DOB: [birth year] Age / Sex: 50 y.o., male   PCP: No primary care provider on file.         Medical Service: Internal Medicine Teaching Service         Attending Physician: Dr. Bartholomew Crews, MD    First Contact: Dr. Philipp Ovens Pager: [phone]  Second Contact: Dr. Randell Patient Pager: [phone]       After Hours (After 5p/  First Contact Pager: [phone]  weekends / holidays): Second Contact Pager: [phone]   Chief Complaint: shortness of breath  History of Present Illness: Darrell Johnson is a 50 yo male with PMHx of HIV, HTN, ESRD on HD TTS, chronic gout, and tobacco abuse who presented to the ED in severe respiratory distress. Patient states he was his normal self yesterday and last night until he awoke suddenly this morning with severe shortness of breath. He states he was wheezing and could not catch his breath. He denied any associated chest pain, fever, chills, or cough. He admits to recent dietary indiscretions and has been drinking more fluid than he is supposed to. His dry weight is 240 lbs. He reports compliance with his HD sessions with his last full session on Saturday, 7/8. He reports compliance with all of his medications including his HTN medications. He states his BP is continuously elevated; however, it normalizes after HD. He currently has a headache after placing the nitropaste, but overall feels his shortness of breath is improving. He denies any recent or current vision changes, chest pain, nausea, vomiting, abdominal pain, diarrhea, dysuria, fever or chills. He has had nasal congestion with a post nasal drip for the last few days. He also admits to low back pain radiating to his right hip and thigh for the past one month.   On presentation to the ED, patient was afebrile, hypertensive at 221/107, tachycardic in the 110s, tachypneic at 36 and satting in the 80s on bipap. Patient was given an albuterol  continuous nebulizer, lasix 40 mg IV once and a nitroglycerin paste. He improved symptomatically and felt less short of breath. He remains on bipap, but his BP has improved to 174/99, HR in the 90s, RR in the 20s, and satting 100% on bipap. In the ED, CBC showed a leukocytosis of 37 with left shift, BMET showed normal potassium at 4.6, creatinine and BUN elevated at 10.97/84 in the setting of ESRD. Troponin was mildly elevated at 0.05, with repeat at 0.03. VBG showed pH 7.15, pCO2 74.5, pO2 64, bicarb 26. Bicarb on BMET 24. BNP elevated at 770.4. Lactic acid normal, 0.84. CXR revealed probably congestive changes with cardiac enlargement, pulmonary vascular congestion, bilateral hilar edema and small right pleural effusion.    Meds: Current Facility-Administered Medications  Medication Dose Route Frequency Provider Last Rate Last Dose  . 0.9 %  sodium chloride infusion  100 mL Intravenous PRN Roney Jaffe, MD      . 0.9 %  sodium chloride infusion  100 mL Intravenous PRN Roney Jaffe, MD      . 0.9 %  sodium chloride infusion  250 mL Intravenous PRN Alexa Angela Burke, MD      . acetaminophen (TYLENOL) tablet 650 mg  650 mg Oral Q6H PRN Alexa Angela Burke, MD       Or  . acetaminophen (TYLENOL) suppository 650 mg  650 mg  Rectal Q6H PRN Alexa Angela Burke, MD      . albuterol (PROVENTIL) (2.5 MG/3ML) 0.083% nebulizer solution 2.5 mg  2.5 mg Nebulization Q4H PRN Alexa R Burns, MD      . alteplase (CATHFLO ACTIVASE) injection 2 mg  2 mg Intracatheter Once PRN Roney Jaffe, MD      . amLODipine (NORVASC) tablet 10 mg  10 mg Oral Daily Alexa Angela Burke, MD      . carvedilol (COREG) tablet 25 mg  25 mg Oral BID WC Alexa Angela Burke, MD      . dolutegravir (TIVICAY) tablet 50 mg  50 mg Oral Daily Alexa R Burns, MD      . doxazosin (CARDURA) tablet 8 mg  8 mg Oral QHS Alexa R Burns, MD      . febuxostat (ULORIC) tablet 80 mg  80 mg Oral Daily Alexa Angela Burke, MD      . Derrill Memo ON 01/29/2016] furosemide (LASIX) tablet 40 mg   40 mg Oral Daily Alexa R Burns, MD      . heparin injection 1,000 Units  1,000 Units Dialysis PRN Roney Jaffe, MD      . heparin injection 2,000 Units  2,000 Units Dialysis PRN Roney Jaffe, MD      . heparin injection 5,000 Units  5,000 Units Subcutaneous Q8H Alexa R Burns, MD      . lidocaine (PF) (XYLOCAINE) 1 % injection 5 mL  5 mL Intradermal PRN Roney Jaffe, MD      . lidocaine-prilocaine (EMLA) cream 1 application  1 application Topical PRN Roney Jaffe, MD      . montelukast (SINGULAIR) tablet 10 mg  10 mg Oral QHS Alexa Angela Burke, MD      . pentafluoroprop-tetrafluoroeth (GEBAUERS) aerosol 1 application  1 application Topical PRN Roney Jaffe, MD      . rilpivirine (EDURANT) tablet 25 mg  25 mg Oral Q breakfast Alexa R Burns, MD      . sevelamer carbonate (RENVELA) tablet 800 mg  800 mg Oral TID WC Alexa R Burns, MD      . sodium bicarbonate tablet 650 mg  650 mg Oral BID Alexa R Burns, MD      . sodium chloride flush (NS) 0.9 % injection 3 mL  3 mL Intravenous Q12H Alexa R Burns, MD      . sodium chloride flush (NS) 0.9 % injection 3 mL  3 mL Intravenous Q12H Alexa R Burns, MD      . sodium chloride flush (NS) 0.9 % injection 3 mL  3 mL Intravenous PRN Alexa Angela Burke, MD      . Derrill Memo ON 02/01/2016] tenofovir (VIREAD) tablet 300 mg  300 mg Oral Once Alexa Angela Burke, MD        Allergies: Allergies as of 01/28/2016  . (No Known Allergies)   Past Medical History  Diagnosis Date  . HIV (human immunodeficiency virus infection) ([place])   . Gout   . HTN (hypertension)   . ESRD (end stage renal disease) ([place])     ESRD due to HTN started dialysis Feb 2016  . Tobacco abuse   . Seizure disorder ([place])   . H1N1 influenza     March 2016  . History of syphilis   . History of gout     Family History:  Mother: HTN Father:   Social History:  Tobacco Use: 1/2 ppd for 35 years Alcohol Use: Denies Illicit Drug Use: Denies  Review of Systems: A  complete ROS was negative except  as per HPI.  General: Denies fever, chills, fatigue, unexpected weight loss, change in appetite and diaphoresis.  HEENT: Admits to nasal congestion. Denies vision changes. Respiratory: Admits to SOB, wheezing. Denies cough.   Cardiovascular: Denies chest pain and palpitations.  Gastrointestinal: Denies nausea, vomiting, abdominal pain, diarrhea, constipation.  Genitourinary: Denies dysuria, urgency, frequency. Musculoskeletal: Admits to low back pain radiating to right hip and thigh. Denies myalgias.  Skin: Denies rash and wounds.  Neurological: Admits to headache (post nitro). Denies dizziness, weakness, lightheadedness  Physical Exam: Filed Vitals:   01/28/16 0811 01/28/16 0824 01/28/16 0830 01/28/16 0900  BP: 156/88 171/92 166/91 143/85  Pulse: 89 86 88 79  Temp:  98.4 F (36.9 C)    TempSrc:  Axillary    Resp: 18 16    SpO2: 100% 100%     General: Vital signs reviewed.  Patient is well-developed and well-nourished, in no acute distress and cooperative with exam.  Head: Normocephalic and atraumatic. Eyes: PERRLA, no scleral icterus.  Neck: Supple, trachea midline, no JVD or carotid bruit present.  Cardiovascular: Tachycardic, regular rhythm, S1 normal, S2 normal, no murmurs, gallops, or rubs. Pulmonary/Chest: Diffuse expiratory wheezes, inspiratory crackles in bilateral lung fields up to mid lung field, scattered rhonchi. On BiPAP, speaking full sentences, no accessory muscle use. Abdominal: Soft, non-tender, non-distended, BS +, no guarding present.  Extremities: No lower extremity edema bilaterally, pulses symmetric and intact bilaterally. Neurological: A&O x3, no focal motor deficit  Skin: Warm, dry and intact. No rashes or erythema. Psychiatric: Normal mood and affect. speech and behavior is normal. Cognition and memory are groosly normal.   EKG: Personally reviewed. Sinus tachycardia, 117. Normal axis. Left atrial enlargement. No acute ST or T wave changes. Similar on  comparison to prior from March 2016.   CXR: Personally reviewed. 1 view CXR shows pulmonary vascular congestion with bilateral hilar edema, cardiac enlargement; however patient is slightly rotated, small right pleural effusion, CXR does not extend inferior enough to fully view left hemidiaphragm.   Assessment & Plan by Problem: Principal Problem:   Acute pulmonary edema (HCC) Active Problems:   HIV (human immunodeficiency virus infection) (HCC)   HTN (hypertension)   ESRD (end stage renal disease) (HCC)   Lumbar radiculopathy   Tobacco abuse   Gout   Acute respiratory failure with hypoxia [facility])   Mr. Picciotto is a 50 yo male with PMHx of HIV, HTN, ESRD on HD TTS, chronic gout, and tobacco abuse who presented to the ED in severe respiratory distress and found to be in acute pulmonary edema.  Acute Hypoxic Respiratory Distress 2/2 Acute Pulmonary Edema: Likely secondary to hypertensive crisis. Volume overload may be contributing as well. Patient presented with acute onset of severe respiratory distress this morning satting in the 80s on bipap. He was tachypneic, tachycardia, and HTN at 221/107 on arrival. Lung exam shows wheezing, bilateral rales and rhonchi. BNP elevated at 770.4, troponin mildly elevated at 0.05>0.03, CXR with vascular congestion and hilar edema with small right pleural effusion. Patient improved after lasix 40 mg IV, albuterol and nitro paste. Although WBC is elevated at 37, patient denies any fever, chills or associated infectious symptoms. Lactic acid normal. Blood culture were drawn, but I favor monitoring patient off of antibiotics at this time. Troponin was mildly elevated likely secondary to hypertensive crisis, I doubt ACS. He denies chest pain and EKG is without acute ischemic changes. Echo from 2016 showed EF 55-60% and no RWMA, mild mitral  regurgitation. Patient has been compliant with HD and with his medications, but does admit to increased fluid intake recently. This in  combination with hypertensive crisis in the setting of uncontrolled HTN likely contributed to his acute pulmonary edema. Doubt PE, acute mitral regurgitation, acute aortic regurgitation or stress induced cardiomyopathy as these are also common causes of flash pulmonary edema. Would continue Bipap, obtain ABG, and assess improvement after HD.  -Nephrology to see, plans for HD today -Monitor BP after HD -Continue home medications of amlodipine 10 mg daily, coreg 25 mg BID, lasix 40 mg daily, and doxazosin 8 mg QHS- may need adjustment if BP continues to be elevated -Trend troponin x 1 and repeat EKG tomorrow am -Albuterol nebulizer Q4H prn wheezing -Will hold off on additional diuresis at this time as patient is to go for HD, restart home lasix tomorrow -Hold off on abx, repeat CBC/BMET tomorrow am  ESRD on HD TTS: Patient recently started HD about 1 year ago. He follows with Dr. Moshe Cipro. He reports compliance with his HD sessions and last completed his full session on Saturday 7/8. 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 Patient is on renvela 800 mg TID, bicarb 650 mg BID. Dr. Jonnie Finner, Nephrology, was consulted from the ED and per ED notes will plan for HD today.  -Nephrology following, appreciate recommendations -Continue renvela and bicarb  HTN: Previous clinic notes and specialty notes indicated long standing uncontrolled HTN; however, patient states after HD his BP normalizes. He reports compliance with home medications of amlodipine 10 mg daily, coreg 25 mg BID, lasix 40 mg daily, and doxazosin 8 mg QHS. Uncontrolled HTN may have been a contributing factor to his acute pulmonary edema given his presenting BP of 221/107. BP has improved to 160/81. Would continue to monitor after HD session and adjust medications according.  -Continue home medications of amlodipine 10 mg daily, coreg 25 mg BID, lasix 40 mg daily, and  doxazosin 8 mg QHS  HIV: Patient is on Tivicay 50 mg daily, rilpivirine 25 mg daily, and tenofovir 300 mg Qweek (last took on Friday 7/7). Patient reports compliance. Last CD4 count 570 and HIV RNA viral load <20 on 09/24/15.  -Continue Tivicay 50 mg daily -Rilpivirine 25 mg daily -Tenofovir 300 mg Qweek (due Friday 7/14)  Right Hip Pain: Likely secondary to lumbar radiculopathy as patient states pain originates in low back and radiates to right hip and right proximal thigh. He denies any injury. He received a steroid injection without relief. He is not currently on any medications.  -Tylenol prn  -Consider gabapentin versus outpatient follow up  Tobacco Abuse: Patient reports 18 pack year history and trying to quit.  -Consider nicotine patch  Chronic Gout: No acute gout flare. Patient is on feboxustat 80 mg daily.  -Continue feboxustat  DVT/PE ppx: Heparin TID  Dispo: Admit patient to Inpatient with expected length of stay greater than 2 midnights.  Signed: Martyn Malay, DO PGY-3 Internal Medicine Resident Pager # [phone] 01/28/2016 9:07 AM

## 2016-01-28 NOTE — Progress Notes (Signed)
Transported Pt from ED Trauma C to HD Bay #7 on ventilator. Pt stable throughout with no complications. Report given to receiving RT. RT will continue to monitor.

## 2016-01-28 NOTE — Progress Notes (Signed)
Hemodialysis- Pt tolerated treatment well. Signed off AMA with 16 minutes remaining d/t "Im uncomfortable and I cant take anymore, Im starting to feel bad." Total UF 3.8L without issue. Report called to [place]. Patient now off bipap sats 95-97% on 3L [place]. BP 144/74, HR 93

## 2016-01-28 NOTE — ED Provider Notes (Addendum)
TIME SEEN: 6:15 AM  CHIEF COMPLAINT: Respiratory distress  HPI: Pt is a 50 y.o. male with history of end-stage renal disease on hemodialysis who presents to the emergency department in severe respiratory distress. History is limited as patient is unable to answer questions because of his respiratory distress. EMS reports that he was hypertensive, diaphoretic and working hard to breathe. They did not have a room air oxygen saturation. He was satting in the 80s on his C Pap. Patient denies any current pain including chest pain. He states his last dialysis was Saturday. States he has been on dialysis for 1 year. Reports he still makes urine. Denies any fevers or cough recently. States that this started suddenly. EMS reports he did vomit once with them. He denies any diarrhea.  ROS: Level V caveat for respiratory distress  PAST MEDICAL HISTORY/PAST SURGICAL HISTORY:  History reviewed. No pertinent past medical history.  MEDICATIONS:  Prior to Admission medications   Not on File    ALLERGIES:  No Known Allergies  SOCIAL HISTORY:  Social History  Substance Use Topics  . Smoking status: Unknown If Ever Smoked  . Smokeless tobacco: Not on file  . Alcohol Use: Not on file    FAMILY HISTORY: No family history on file.  EXAM: BP 221/107 mmHg  Pulse 118  Temp(Src) 98.3 F (36.8 C) (Axillary)  Resp 36  SpO2 97% CONSTITUTIONAL: Chronically ill-appearing, in severe respiratory distress, unable to answer most questions and can speak only one word at a time HEAD: Normocephalic EYES: Conjunctivae clear, PERRL ENT: normal nose; no rhinorrhea; moist mucous membranes NECK: Supple, no meningismus, no LAD  CARD: Regular and tachycardic; S1 and S2 appreciated; no murmurs, no clicks, no rubs, no gallops RESP: Patient is in severe respiratory distress, he is tachypneic, diffuse rhonchorous breath sounds, working hard to breathe, only able to speak one word at a time intermittently ABD/GI: Normal  bowel sounds; non-distended; soft, non-tender, no rebound, no guarding, no peritoneal signs BACK:  The back appears normal and is non-tender to palpation, there is no CVA tenderness EXT: Normal ROM in all joints; non-tender to palpation; no edema; normal capillary refill; no cyanosis, no calf tenderness or swelling, AV fistula noted in the right upper extremity SKIN: Normal color for age and race; patient is diaphoretic NEURO: Moves all extremities equally  MEDICAL DECISION MAKING: Patient here with severe respiratory distress. He appears volume overloaded. He is hypertensive. Unable to obtain full history. No family at bedside. He states he still makes urine and has only been on dialysis for 1 year. Because of this we will attempt to diurese him with Lasix. He is also hypertensive. We'll give hydralazine, nitroglycerin paste. He is currently on BiPAP. We'll discuss with nephrology emergently given I feel he will need dialysis this morning. Labs, chest x-ray ordered. EKG shows sinus tachycardia.  ED PROGRESS: 6:40 AM  Pt is quickly improving on BiPAP. His respiratory rate has already improved in a he is able to speak short sentences. Blood pressure is improved without Nitropaste or hydralazine. We'll just give nitroglycerin and hold on hydralazine at this time. Patient is able to tell me that he is a full code. He denies that he has missed any dialysis recently. He is unable to tell me who his nephrologist is. States he goes to dialysis off [address].   7:10 AM  D/w Dr. Jonnie Finner with nephrology. They will see the patient, and get him set up for dialysis today. Chest x-ray shows pulmonary edema.  No infiltrate. Labs show potassium of 4.6.  VBG shows pH of 7.15, PCO2 74.8, PO2 64.  Nursing staff unable to obtain ABG. We will repeat an ABG and a 1 hour after being on BiPAP. His heart rate is now in the 100s, respiratory rate in the 20s. He is not able to speak full sentences. His wife is at bedside.  Patient states that his PCP is with [facility] Internal medicine.  Patient does have a leukocytosis of 37,000 but his clinical picture does not appear to be that of infectious etiology. He denies fevers, cough. Will obtain rectal temperature, lactate, blood cultures which can be followed up by internal medicine teaching team. At this time I do not feel he needs empiric antibiotics.  Discussed with Dr. Quay Burow with internal medicine teaching service. They agree and admission to step down bed. Attending is Dr. Verlin Dike. I will place on orders per her request. Care transferred to internal medicine teaching team.     EKG Interpretation  Date/Time:  Monday January 28 2016 06:21:51 EDT Ventricular Rate:  117 PR Interval:    QRS Duration: 96 QT Interval:  327 QTC Calculation: 457 R Axis:   81 Text Interpretation:  Sinus tachycardia Biatrial enlargement Borderline right axis deviation ST elevation, consider anterior injury No old tracing to compare Confirmed by WARD,  DO, KRISTEN ([id number]) on 01/28/2016 6:25:38 AM         CRITICAL CARE Performed by: Nyra Jabs   Total critical care time: 45 minutes  Critical care time was exclusive of separately billable procedures and treating other patients.  Critical care was necessary to treat or prevent imminent or life-threatening deterioration.  Critical care was time spent personally by me on the following activities: development of treatment plan with patient and/or surrogate as well as nursing, discussions with consultants, evaluation of patient's response to treatment, examination of patient, obtaining history from patient or surrogate, ordering and performing treatments and interventions, ordering and review of laboratory studies, ordering and review of radiographic studies, pulse oximetry and re-evaluation of patient's condition.   [place], DO 01/28/16 Oliver, DO 01/28/16 [id number]

## 2016-01-28 NOTE — Procedures (Signed)
  I was present at this dialysis session, have reviewed the session itself and made  appropriate changes Kelly Splinter MD [place] pager [phone]    cell [phone] 01/28/2016, 12:17 PM

## 2016-01-29 ENCOUNTER — Encounter (HOSPITAL_COMMUNITY): Payer: Self-pay | Admitting: *Deleted

## 2016-01-29 DIAGNOSIS — J810 Acute pulmonary edema: Principal | ICD-10-CM

## 2016-01-29 DIAGNOSIS — Z992 Dependence on renal dialysis: Secondary | ICD-10-CM

## 2016-01-29 DIAGNOSIS — J9601 Acute respiratory failure with hypoxia: Secondary | ICD-10-CM

## 2016-01-29 DIAGNOSIS — N186 End stage renal disease: Secondary | ICD-10-CM

## 2016-01-29 LAB — BASIC METABOLIC PANEL
ANION GAP: 16 — AB (ref 5–15)
BUN: 64 mg/dL — ABNORMAL HIGH (ref 6–20)
CALCIUM: 8.6 mg/dL — AB (ref 8.9–10.3)
CO2: 24 mmol/L (ref 22–32)
CREATININE: 9.7 mg/dL — AB (ref 0.61–1.24)
Chloride: 98 mmol/L — ABNORMAL LOW (ref 101–111)
GFR calc Af Amer: 6 mL/min — ABNORMAL LOW (ref >60)
GFR, EST NON AFRICAN AMERICAN: 6 mL/min — AB (ref >60)
GLUCOSE: 83 mg/dL (ref 65–99)
Potassium: 4 mmol/L (ref 3.5–5.1)
Sodium: 138 mmol/L (ref 135–145)

## 2016-01-29 LAB — BLOOD GAS, ARTERIAL
ACID-BASE EXCESS: 1.9 mmol/L (ref 0.0–2.0)
BICARBONATE: 26.2 meq/L — AB (ref 20.0–24.0)
Drawn by: 24686
FIO2: 0.6
LHR: 12 {breaths}/min
MODE: POSITIVE
O2 SAT: 99.1 %
PEEP: 8 cmH2O
PRESSURE SUPPORT: 8 cmH2O
Patient temperature: 98.6
TCO2: 27.5 mmol/L (ref 0–100)
pCO2 arterial: 43 mmHg (ref 35.0–45.0)
pH, Arterial: 7.402 (ref 7.350–7.450)
pO2, Arterial: 171 mmHg — ABNORMAL HIGH (ref 80.0–100.0)

## 2016-01-29 LAB — CBC
HCT: 32.8 % — ABNORMAL LOW (ref 39.0–52.0)
Hemoglobin: 10.6 g/dL — ABNORMAL LOW (ref 13.0–17.0)
MCH: 29.4 pg (ref 26.0–34.0)
MCHC: 32.3 g/dL (ref 30.0–36.0)
MCV: 90.9 fL (ref 78.0–100.0)
PLATELETS: 179 10*3/uL (ref 150–400)
RBC: 3.61 MIL/uL — ABNORMAL LOW (ref 4.22–5.81)
RDW: 16.8 % — ABNORMAL HIGH (ref 11.5–15.5)
WBC: 10 10*3/uL (ref 4.0–10.5)

## 2016-01-29 MED ORDER — HYDRALAZINE HCL 10 MG PO TABS: 10.0000 mg | ORAL_TABLET | Freq: Three times a day (TID) | ORAL | Status: DC

## 2016-01-29 MED ORDER — HYDRALAZINE HCL 10 MG PO TABS
10.0000 mg | ORAL_TABLET | Freq: Three times a day (TID) | ORAL | Status: DC
Start: 2016-01-29 — End: 2016-01-29

## 2016-01-29 NOTE — Progress Notes (Signed)
  Date: 01/29/2016  Patient name: Darrell Johnson  Medical record number: [id number]  Date of birth: December 21, 1965   I have seen and evaluated Randalyn Rhea and discussed their care with the Residency Team. Mr Perkin came to ED for sudden onset dyspnea. This occure when he woke up this AM. He had no preceding events or sxs. He is ESRD but has not missed any HD. He has no CP. He was found to be hypoxic - O2 sat 80's on CPAP.  Pt was taken urgently to HD and sxs resolved. This AM, he is 100% on RA.   Filed Vitals:   01/29/16 1500 01/29/16 1530  BP: 159/68 158/88  Pulse: 73 76  Temp:    Resp: 16 15  Afebrile NAd Speaking in full sentences LCTAB HRR no MRG ABD Obese, + BS, soft, NT Ext No edema B  I personally viewed his CXR images and confirmed by reading with the official read. R effusion, L angle cut off, edema  I personally viewed his EKG and confirmed by reading with the official read. B atrial enlargement, Concave ST elevation V2 and 3, resolved on repeat EKG   Assessment and Plan: I have seen and evaluated the patient as outlined above. I agree with the formulated Assessment and Plan as detailed in the residents' note, with the following changes:   1. Acute Hypoxic Respiratory Distress 2/2 Acute Pulmonary Edema - now resolved with lasix diuresis and HD. The cause is not clear - trop I are nl, No missed HD, no infiltrate on CXR, and EF has been nl without mention of diastolic HF on ECHO. He was hypertensive on admit although could be cause or sequela of the flash pul edema. Resolved. Repeat HD today then D/C home  2. HTN urgency - now BP is at baseline. His baseline on amlodipine 10, coreg 10, lasix 40, and doxazosin 8 is poorly controlled though. We are adding hydralazine - not the best choice bc TID but no other better options.   3. R hip pain / back pain - unchanged and may F/U as outpt.  Bartholomew Crews, MD 7/11/20174:29 PM

## 2016-01-29 NOTE — Progress Notes (Signed)
Discharge instruction given and patient verbalized understanding. Prescription faxed over to pharmacy.IV removed. Patient have his belonging cell phone and charger and duffle bag

## 2016-01-29 NOTE — Discharge Summary (Signed)
Name: Darrell Johnson MRN: [id number] DOB: 1965-10-26 50 y.o. PCP: No primary care provider on file.  Date of Admission: 01/28/2016  6:11 AM Date of Discharge: 01/29/2016 Attending Physician: Bartholomew Crews, MD  Discharge Diagnosis: 1. Acute Hypoxic Respiratory Distress 2/2 to Acute Pulmonary Edema 2. ESRD on HD TTS  Principal Problem:   Acute pulmonary edema (HCC) Active Problems:   HIV (human immunodeficiency virus infection) ([place])   HTN (hypertension)   ESRD (end stage renal disease) (HCC)   Lumbar radiculopathy   Tobacco abuse   Gout   Acute respiratory failure with hypoxia ([place])   Discharge Medications:   Medication List    TAKE these medications        acetaminophen 500 MG tablet  Commonly known as:  TYLENOL  Take 500-1,000 mg by mouth every 6 (six) hours as needed for mild pain, moderate pain or headache.     amLODipine 10 MG tablet  Commonly known as:  NORVASC  Take 10 mg by mouth at bedtime.     carvedilol 25 MG tablet  Commonly known as:  COREG  Take 25 mg by mouth 2 (two) times daily with a meal.     doxazosin 8 MG tablet  Commonly known as:  CARDURA  Take 8 mg by mouth daily.     furosemide 40 MG tablet  Commonly known as:  LASIX  Take 40 mg by mouth daily.     hydrALAZINE 10 MG tablet  Commonly known as:  APRESOLINE  Take 1 tablet (10 mg total) by mouth 3 (three) times daily.     methocarbamol 500 MG tablet  Commonly known as:  ROBAXIN  Take 500 mg by mouth 2 (two) times daily as needed for muscle spasms.     montelukast 10 MG tablet  Commonly known as:  SINGULAIR  Take 10 mg by mouth at bedtime.     predniSONE 10 MG tablet  Commonly known as:  DELTASONE  Take 10 mg by mouth See admin instructions. 12-day's taper: 6-6-6-6-4-4-4-4-2-2-2-2-D/C (48 tablets)     tenofovir 300 MG tablet  Commonly known as:  VIREAD  Take 300 mg by mouth once a week.     TIVICAY 50 MG tablet  Generic drug:  dolutegravir  Take 50 mg by mouth daily.       ULORIC 80 MG Tabs  Generic drug:  Febuxostat  Take 1 tablet by mouth daily.        Disposition and follow-up:   Darrell Johnson was discharged from [facility] in Stable condition.  At the hospital follow up visit please address:  1.  Please follow up on the following: - Blood pressure control. Patient his a history of uncontrolled HTN and reports his blood pressure normalizes after HD. However, BP remained elevated during his stay even after 2 episodes of HD. His volume overload was felt to be in part caused by hypertensive crisis on admission (BP 221/107). He reports compliance with all of his BP medicines (amlodipine, coreg, lasix, and doxazosin) which are all dosed at maximum therapy. Hydralazine 10mg  TID was added prior to discharge. Please follow up with patient on new medication and titrate dose as needed.  - Please address smoking cessation. Reports 18 year pack history and is actively trying to quit. - R Hip pain: likely lumbar radiculopathy, has received steroid injection without relief. Consider outpatient PT.  2.  Labs / imaging needed at time of follow-up: None  3.  Pending labs/  test needing follow-up: None  Follow-up Appointments:     Follow-up Information    Follow up with [place]. Go on 02/05/2016.   Why:  Hospital follow up, July 18th at 10:45am   Contact information:   [address] [place] Hospital Course by problem list: Principal Problem:   Acute pulmonary edema (Darrell Johnson) Active Problems:   HIV (human immunodeficiency virus infection) (Darrell Johnson)   HTN (hypertension)   ESRD (end stage renal disease) (Darrell Johnson)   Lumbar radiculopathy   Tobacco abuse   Gout   Acute respiratory failure with hypoxia (Darrell Johnson)   1. Acute Hypoxic Respiratory Distress 2/2 to Acute Pulmonary Edema: Likely secondary to hypertensive crisis and volume overload with recent dietary indiscretion. Patient  presented with acute onset of severe respiratory distress satting in the 80s on bipap. He was tachypneic, tachycardia, and HTN at 221/107 on arrival. Lung exam shows wheezing, bilateral rales and rhonchi. BNP elevated at 770.4, troponin mildly elevated at 0.03 -> 0.05 -> 0.07 -> 0.08, CXR with vascular congestion and hilar edema with small right pleural effusion. Patient improved after lasix 40 mg IV, albuterol and nitro paste. Troponin was elevated likely secondary to hypertensive crisis, unlikely to be ACS. He denies chest pain and EKG is without acute ischemic changes. Echo from 2016 showed EF 55-60% and no RWMA, mild mitral regurgitation. Patient has been compliant with HD and with his medications, but does admit to increased fluid intake recently. This in combination with hypertensive crisis in the setting of uncontrolled HTN likely contributed to his acute pulmonary edema. Patient received HD on 7/11 and 7/10 with improvement of symptoms.  2. ESRD on HD TTS: Patient recently started HD about 1 year ago. He follows with Dr. Moshe Cipro. He reports compliance with his HD sessions and last completed his full session on Saturday 7/8. 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 Continued his home renvela 800 mg TID, bicarb 650 mg BID.   3. HTN: Previous clinic notes and specialty notes indicated long standing uncontrolled HTN; however, patient states after HD his BP normalizes. He reports compliance with home medications of amlodipine 10 mg daily, coreg 25 mg BID, lasix 40 mg daily, and doxazosin 8 mg QHS. Uncontrolled HTN may have been a contributing factor to his acute pulmonary edema given his presenting BP of 221/107. BP improved after HD but remained elevate at [id number] systolic. Home medications of amlodipine 10 mg daily, coreg 25 mg BID, lasix 40 mg daily, and doxazosin 8 mg QHS were continued. Hydralazine 10mg  TID was added prior to  discharge with plans to titrate as needed outpatient.  4. HIV: Patient is on Tivicay 50 mg daily, rilpivirine 25 mg daily, and tenofovir 300 mg Qweek (last took on Friday 7/7). Patient reports compliance. Last CD4 count 570 and HIV RNA viral load <20 on 09/24/15. Continued Tivicay 50 mg daily and Rilpivirine 25 mg daily. Tenofovir 300 mg Qweek (due Friday 7/14).  Discharge Vitals:   BP 158/88 mmHg  Pulse 76  Temp(Src) 97.7 F (36.5 C) (Oral)  Resp 15  Ht 5\' 11"  (1.803 m)  Wt 238 lb (107.956 kg)  BMI 33.21 kg/m2  SpO2 100%  Pertinent Labs, Studies, and Procedures:   01/28/2016 CXR Port 1 View IMPRESSION: Probable congestive changes with cardiac enlargement, pulmonary vascular congestion, and bilateral perihilar edema. Small right pleural effusion.  Discharge Instructions: Discharge Instructions  Call MD for:  difficulty breathing, headache or visual disturbances    Complete by:  As directed      Diet - low sodium heart healthy    Complete by:  As directed      Discharge instructions    Complete by:  As directed   Start taking hydralazine 10mg  TID for high blood pressure in addition to your other home medication.     Increase activity slowly    Complete by:  As directed            Signed: Velna Ochs, MD 01/29/2016, 4:12 PM   Pager: [phone]

## 2016-01-29 NOTE — Progress Notes (Signed)
Pt has order for cardiac monitoring. No monitors on unit. MD paged and notified. Team is to round with patient and see if need but MD says its ok for the patient to be off for now.

## 2016-01-29 NOTE — Progress Notes (Signed)
Pt d/c with son and pt belongings. Will continue to monitor pt. Ranelle Oyster, RN

## 2016-01-29 NOTE — Progress Notes (Addendum)
Subjective: Mr. Brink is feeling better this morning. Reports his SOB has improved and breathing is back to baseline. No complaints. Sating 100% on room air.   Objective: Vital signs in last 24 hours: Filed Vitals:   01/29/16 1405 01/29/16 1430 01/29/16 1500 01/29/16 1530  BP: 161/78 162/71 159/68 158/88  Pulse: 73 68 73 76  Temp:      TempSrc:      Resp: 17 15 16 15   Height:      Weight:      SpO2:       Labs:  Ref. Range 01/29/2016 04:14  Sodium Latest Ref Range: 135-145 mmol/L 138  Potassium Latest Ref Range: 3.5-5.1 mmol/L 4.0  Chloride Latest Ref Range: 101-111 mmol/L 98 (L)  CO2 Latest Ref Range: 22-32 mmol/L 24  BUN Latest Ref Range: 6-20 mg/dL 64 (H)  Creatinine Latest Ref Range: 0.61-1.24 mg/dL 9.70 (H)  Calcium Latest Ref Range: 8.9-10.3 mg/dL 8.6 (L)  EGFR (Non-African Amer.) Latest Ref Range: >60 mL/min 6 (L)  EGFR (African American) Latest Ref Range: >60 mL/min 6 (L)  Glucose Latest Ref Range: 65-99 mg/dL 83  Anion gap Latest Ref Range: 5-15  16 (H)    Ref. Range 01/29/2016 04:14  WBC Latest Ref Range: 4.0-10.5 K/uL 10.0  RBC Latest Ref Range: 4.22-5.81 MIL/uL 3.61 (L)  Hemoglobin Latest Ref Range: 13.0-17.0 g/dL 10.6 (L)  HCT Latest Ref Range: 39.0-52.0 % 32.8 (L)  MCV Latest Ref Range: 78.0-100.0 fL 90.9  MCH Latest Ref Range: 26.0-34.0 pg 29.4  MCHC Latest Ref Range: 30.0-36.0 g/dL 32.3  RDW Latest Ref Range: 11.5-15.5 % 16.8 (H)  Platelets Latest Ref Range: 150-400 K/uL 179   Physical Exam General: Vital signs reviewed. Patient is well-developed and well-nourished, in no acute distress and cooperative with exam.  Head: Normocephalic and atraumatic. Eyes: PERRLA, no scleral icterus.  Neck: Supple, trachea midline, no JVD or carotid bruit present.  Cardiovascular: RRR, S1 normal, S2 normal, no murmurs, gallops, or rubs. Pulmonary/Chest: Mild expiratory wheezes and crackles in bilateral lung fields improved from yesterday. No rhonchi. Speaking full  sentences, no accessory muscle use. Abdominal: Soft, non-tender, non-distended, BS +, no guarding present.  Extremities: No lower extremity edema bilaterally, pulses symmetric and intact bilaterally. Neurological: A&O x3, no focal motor deficit  Skin: Warm, dry and intact. No rashes or erythema. Psychiatric: Normal mood and affect. speech and behavior is normal. Cognition and memory are groosly normal.  Assessment/Plan: Mr. Rogerson is a 50 yo male with PMHx of HIV, HTN, ESRD on HD TTS, chronic gout, and tobacco abuse who presented to the ED in severe respiratory distress and found to be in acute pulmonary edema, now s/p emergent HD.   Acute Hypoxic Respiratory Distress 2/2 Acute Pulmonary Edema: Likely secondary to hypertensive crisis. Volume overload may be contributing as well. Patient presented with acute onset of severe respiratory distress satting in the 80s on bipap. He was tachypneic, tachycardia, and HTN at 221/107 on arrival. Lung exam shows wheezing, bilateral rales and rhonchi. BNP elevated at 770.4, troponin mildly elevated at 0.05>0.03, CXR with vascular congestion and hilar edema with small right pleural effusion. Patient improved after lasix 40 mg IV, albuterol and nitro paste. Troponin was mildly elevated likely secondary to hypertensive crisis, unlikely to be ACS. He denies chest pain and EKG is without acute ischemic changes. Echo from 2016 showed EF 55-60% and no RWMA, mild mitral regurgitation. Patient has been compliant with HD and with his medications, but does admit to increased  fluid intake recently. This in combination with hypertensive crisis in the setting of uncontrolled HTN likely contributed to his acute pulmonary edema.  -Nephrology following, HD yesterday and today, appreciate recs -Continue to monitor BP -Continue home medications of amlodipine 10 mg daily, coreg 25 mg BID, lasix 40 mg daily, and doxazosin 8 mg QHS- BP remains elevated today despite home regimen and HD.  Started Hydralazine 10 TID with plans to titrate as needed in clinic -Albuterol nebulizer Q4H prn wheezing -Restarted home lasix today  ESRD on HD TTS: Patient recently started HD about 1 year ago. He follows with Dr. Moshe Cipro. He reports compliance with his HD sessions and last completed his full session on Saturday 7/8. 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 Patient is on renvela 800 mg TID, bicarb 650 mg BID.  -Nephrology following, appreciate recommendations -Continue renvela and bicarb - HD yesterday and today  HTN: Previous clinic notes and specialty notes indicated long standing uncontrolled HTN; however, patient states after HD his BP normalizes. He reports compliance with home medications of amlodipine 10 mg daily, coreg 25 mg BID, lasix 40 mg daily, and doxazosin 8 mg QHS. Uncontrolled HTN may have been a contributing factor to his acute pulmonary edema given his presenting BP of 221/107. BP has improved to 160/81. Would continue to monitor after HD session and adjust medications according.  -Continue home medications of amlodipine 10 mg daily, coreg 25 mg BID, lasix 40 mg daily, and doxazosin 8 mg QHS - Continues to be hypertensive today despite HD yesterday; Start Hydralazine 10 TID with plans to titrate as needed outpatient  HIV: Patient is on Tivicay 50 mg daily, rilpivirine 25 mg daily, and tenofovir 300 mg Qweek (last took on Friday 7/7). Patient reports compliance. Last CD4 count 570 and HIV RNA viral load <20 on 09/24/15.  -Continue Tivicay 50 mg daily -Rilpivirine 25 mg daily -Tenofovir 300 mg Qweek (due Friday 7/14)  Right Hip Pain: Likely secondary to lumbar radiculopathy as patient states pain originates in low back and radiates to right hip and right proximal thigh. He denies any injury. He received a steroid injection without relief. He is not currently on any medications.  -Tylenol prn   -Outpatient follow up  Tobacco Abuse: Patient reports 18 pack year history and trying to quit.  -Consider nicotine patch  Chronic Gout: No acute gout flare. Patient is on feboxustat 80 mg daily.  -Continue feboxustat  DVT/PE ppx: Heparin TID  Dispo: Anticipated discharge in approximately 1-2 day(s).   LOS: 1 day   Velna Ochs, MD 01/29/2016, 4:25 PM Pager: [phone]

## 2016-01-29 NOTE — Care Management Note (Signed)
Case Management Note  Patient Details  Name: Darrell Johnson MRN: [id number] Date of Birth: [birth year]  Subjective/Objective:                 Pt to f/u with IM clinic. No CM needs identified.    Action/Plan:  DC to home, self care.  Expected Discharge Date:                  Expected Discharge Plan:  Home/Self Care  In-House Referral:     Discharge planning Services  CM Consult  Post Acute Care Choice:    Choice offered to:     DME Arranged:    DME Agency:     HH Arranged:    [place] Agency:     Status of Service:  Completed, signed off  If discussed at [employer] of Stay Meetings, dates discussed:    Additional Comments:  Carles Collet, RN 01/29/2016, 2:23 PM

## 2016-01-29 NOTE — Progress Notes (Signed)
  [facility] Progress Note   Subjective: alert no distress  Filed Vitals:   01/29/16 0600 01/29/16 0740 01/29/16 0741 01/29/16 0940  BP:  172/97  155/83  Pulse:    73  Temp:   98 F (36.7 C) 98.4 F (36.9 C)  TempSrc:   Oral Oral  Resp: 16 22  17   Height:    5\' 11"  (1.803 m)  Weight:    107.956 kg (238 lb)  SpO2: 96% 97%  100%    Inpatient medications: . amLODipine  10 mg Oral Daily  . carvedilol  25 mg Oral BID WC  . dolutegravir  50 mg Oral Daily  . doxazosin  8 mg Oral QHS  . febuxostat  80 mg Oral Daily  . furosemide  40 mg Oral Daily  . heparin  5,000 Units Subcutaneous Q8H  . hydrALAZINE  10 mg Oral Q8H  . montelukast  10 mg Oral QHS  . rilpivirine  25 mg Oral Q breakfast  . sevelamer carbonate  800 mg Oral TID WC  . sodium bicarbonate  650 mg Oral BID  . sodium chloride flush  3 mL Intravenous Q12H  . sodium chloride flush  3 mL Intravenous Q12H  . [START ON 02/01/2016] tenofovir  300 mg Oral Weekly     sodium chloride, sodium chloride, sodium chloride, acetaminophen **OR** acetaminophen, albuterol, HYDROcodone-acetaminophen, sodium chloride flush  Exam: Alert, no distress Calm, on HD No jvd Chest clear bilat RRR no mrg Abd soft ntnd Ext no edema R arm AV fistula +bruit NF, ox3  Dialysis: [facility] 4h 74min 107.5kg 2/2.5 bath Hep 6000/ 3000 mid Rx prn RFA AVF venofer 50/wk, mircera 75 q 4 last 7/6, dialyvite, renvela 3ac Amlodipine 10/d, Cardura 8 hs, Coreg 25 bid  Assessment: 1. Resp distress/ acute resp failure/ pulm edema - vol overload, possible lean body wt loss. Is at dry wt today; should have more vol to get given presentation. HD again today. Lower dry wt as tolerated. OK for dc after HD. 2. ESRD usual HD TTS 3. HIV 4. HTN on 3 bp meds 5. ^^WBC - wbc down today, prob demargination 6.  Dispo - ok for dc after HD today   Plan - as above   Kelly Splinter MD [place] pager [phone]    cell  [phone] 01/29/2016, 2:16 PM    Recent Labs Lab 01/28/16 0624 01/28/16 0631 01/29/16 0414  NA 142 142 138  K 4.6 4.6 4.0  CL 103 104 98*  CO2 24  --  24  GLUCOSE 145* 144* 83  BUN 84* 93* 64*  CREATININE 10.97* 10.30* 9.70*  CALCIUM 8.8*  --  8.6*   No results for input(s): AST, ALT, ALKPHOS, BILITOT, PROT, ALBUMIN in the last 168 hours.  Recent Labs Lab 01/28/16 0624 01/28/16 0631 01/29/16 0414  WBC 37.4*  --  10.0  NEUTROABS 23.6*  --   --   HGB 13.3 14.6 10.6*  HCT 41.5 43.0 32.8*  MCV 92.2  --  90.9  PLT 347  --  179   Iron/TIBC/Ferritin/ %Sat No results found for: IRON, TIBC, FERRITIN, IRONPCTSAT

## 2016-01-30 ENCOUNTER — Encounter (HOSPITAL_COMMUNITY): Payer: Self-pay

## 2016-01-31 DIAGNOSIS — N2581 Secondary hyperparathyroidism of renal origin: Secondary | ICD-10-CM | POA: Diagnosis not present

## 2016-01-31 DIAGNOSIS — D631 Anemia in chronic kidney disease: Secondary | ICD-10-CM | POA: Diagnosis not present

## 2016-01-31 DIAGNOSIS — D509 Iron deficiency anemia, unspecified: Secondary | ICD-10-CM | POA: Diagnosis not present

## 2016-01-31 DIAGNOSIS — N186 End stage renal disease: Secondary | ICD-10-CM | POA: Diagnosis not present

## 2016-02-02 DIAGNOSIS — D509 Iron deficiency anemia, unspecified: Secondary | ICD-10-CM | POA: Diagnosis not present

## 2016-02-02 DIAGNOSIS — N186 End stage renal disease: Secondary | ICD-10-CM | POA: Diagnosis not present

## 2016-02-02 DIAGNOSIS — N2581 Secondary hyperparathyroidism of renal origin: Secondary | ICD-10-CM | POA: Diagnosis not present

## 2016-02-02 DIAGNOSIS — D631 Anemia in chronic kidney disease: Secondary | ICD-10-CM | POA: Diagnosis not present

## 2016-02-02 LAB — CULTURE, BLOOD (ROUTINE X 2)
CULTURE: NO GROWTH
Culture: NO GROWTH

## 2016-02-04 DIAGNOSIS — N186 End stage renal disease: Secondary | ICD-10-CM | POA: Diagnosis not present

## 2016-02-04 DIAGNOSIS — D509 Iron deficiency anemia, unspecified: Secondary | ICD-10-CM | POA: Diagnosis not present

## 2016-02-04 DIAGNOSIS — N2581 Secondary hyperparathyroidism of renal origin: Secondary | ICD-10-CM | POA: Diagnosis not present

## 2016-02-04 DIAGNOSIS — D631 Anemia in chronic kidney disease: Secondary | ICD-10-CM | POA: Diagnosis not present

## 2016-02-05 ENCOUNTER — Ambulatory Visit: Payer: Medicare Other

## 2016-02-06 ENCOUNTER — Other Ambulatory Visit: Payer: Self-pay | Admitting: Internal Medicine

## 2016-02-06 DIAGNOSIS — D509 Iron deficiency anemia, unspecified: Secondary | ICD-10-CM | POA: Diagnosis not present

## 2016-02-06 DIAGNOSIS — N186 End stage renal disease: Secondary | ICD-10-CM | POA: Diagnosis not present

## 2016-02-06 DIAGNOSIS — M1000 Idiopathic gout, unspecified site: Secondary | ICD-10-CM

## 2016-02-06 DIAGNOSIS — N2581 Secondary hyperparathyroidism of renal origin: Secondary | ICD-10-CM | POA: Diagnosis not present

## 2016-02-06 DIAGNOSIS — D631 Anemia in chronic kidney disease: Secondary | ICD-10-CM | POA: Diagnosis not present

## 2016-02-06 MED ORDER — COLCHICINE 0.6 MG PO TABS: ORAL_TABLET | ORAL | Status: DC

## 2016-02-07 DIAGNOSIS — N186 End stage renal disease: Secondary | ICD-10-CM | POA: Diagnosis not present

## 2016-02-07 DIAGNOSIS — D631 Anemia in chronic kidney disease: Secondary | ICD-10-CM | POA: Diagnosis not present

## 2016-02-07 DIAGNOSIS — N2581 Secondary hyperparathyroidism of renal origin: Secondary | ICD-10-CM | POA: Diagnosis not present

## 2016-02-07 DIAGNOSIS — D509 Iron deficiency anemia, unspecified: Secondary | ICD-10-CM | POA: Diagnosis not present

## 2016-02-10 ENCOUNTER — Other Ambulatory Visit: Payer: Self-pay | Admitting: Internal Medicine

## 2016-02-11 ENCOUNTER — Ambulatory Visit: Payer: Medicare Other | Admitting: Internal Medicine

## 2016-02-11 ENCOUNTER — Other Ambulatory Visit: Payer: Self-pay | Admitting: Internal Medicine

## 2016-02-11 DIAGNOSIS — N2581 Secondary hyperparathyroidism of renal origin: Secondary | ICD-10-CM | POA: Diagnosis not present

## 2016-02-11 DIAGNOSIS — D509 Iron deficiency anemia, unspecified: Secondary | ICD-10-CM | POA: Diagnosis not present

## 2016-02-11 DIAGNOSIS — N186 End stage renal disease: Secondary | ICD-10-CM | POA: Diagnosis not present

## 2016-02-11 DIAGNOSIS — D631 Anemia in chronic kidney disease: Secondary | ICD-10-CM | POA: Diagnosis not present

## 2016-02-12 DIAGNOSIS — M150 Primary generalized (osteo)arthritis: Secondary | ICD-10-CM | POA: Diagnosis not present

## 2016-02-12 DIAGNOSIS — M1009 Idiopathic gout, multiple sites: Secondary | ICD-10-CM | POA: Diagnosis not present

## 2016-02-12 DIAGNOSIS — N186 End stage renal disease: Secondary | ICD-10-CM | POA: Diagnosis not present

## 2016-02-12 DIAGNOSIS — Z21 Asymptomatic human immunodeficiency virus [HIV] infection status: Secondary | ICD-10-CM | POA: Diagnosis not present

## 2016-02-12 DIAGNOSIS — M2550 Pain in unspecified joint: Secondary | ICD-10-CM | POA: Diagnosis not present

## 2016-02-13 ENCOUNTER — Ambulatory Visit: Payer: Medicare Other | Admitting: Internal Medicine

## 2016-02-13 DIAGNOSIS — N2581 Secondary hyperparathyroidism of renal origin: Secondary | ICD-10-CM | POA: Diagnosis not present

## 2016-02-13 DIAGNOSIS — D631 Anemia in chronic kidney disease: Secondary | ICD-10-CM | POA: Diagnosis not present

## 2016-02-13 DIAGNOSIS — D509 Iron deficiency anemia, unspecified: Secondary | ICD-10-CM | POA: Diagnosis not present

## 2016-02-13 DIAGNOSIS — N186 End stage renal disease: Secondary | ICD-10-CM | POA: Diagnosis not present

## 2016-02-15 DIAGNOSIS — N2581 Secondary hyperparathyroidism of renal origin: Secondary | ICD-10-CM | POA: Diagnosis not present

## 2016-02-15 DIAGNOSIS — D631 Anemia in chronic kidney disease: Secondary | ICD-10-CM | POA: Diagnosis not present

## 2016-02-15 DIAGNOSIS — N186 End stage renal disease: Secondary | ICD-10-CM | POA: Diagnosis not present

## 2016-02-15 DIAGNOSIS — D509 Iron deficiency anemia, unspecified: Secondary | ICD-10-CM | POA: Diagnosis not present

## 2016-02-18 DIAGNOSIS — D631 Anemia in chronic kidney disease: Secondary | ICD-10-CM | POA: Diagnosis not present

## 2016-02-18 DIAGNOSIS — Z992 Dependence on renal dialysis: Secondary | ICD-10-CM | POA: Diagnosis not present

## 2016-02-18 DIAGNOSIS — D509 Iron deficiency anemia, unspecified: Secondary | ICD-10-CM | POA: Diagnosis not present

## 2016-02-18 DIAGNOSIS — N2581 Secondary hyperparathyroidism of renal origin: Secondary | ICD-10-CM | POA: Diagnosis not present

## 2016-02-18 DIAGNOSIS — N186 End stage renal disease: Secondary | ICD-10-CM | POA: Diagnosis not present

## 2016-02-18 DIAGNOSIS — I120 Hypertensive chronic kidney disease with stage 5 chronic kidney disease or end stage renal disease: Secondary | ICD-10-CM | POA: Diagnosis not present

## 2016-02-20 DIAGNOSIS — N186 End stage renal disease: Secondary | ICD-10-CM | POA: Diagnosis not present

## 2016-02-20 DIAGNOSIS — D509 Iron deficiency anemia, unspecified: Secondary | ICD-10-CM | POA: Diagnosis not present

## 2016-02-20 DIAGNOSIS — D631 Anemia in chronic kidney disease: Secondary | ICD-10-CM | POA: Diagnosis not present

## 2016-02-20 DIAGNOSIS — N2581 Secondary hyperparathyroidism of renal origin: Secondary | ICD-10-CM | POA: Diagnosis not present

## 2016-02-22 DIAGNOSIS — N2581 Secondary hyperparathyroidism of renal origin: Secondary | ICD-10-CM | POA: Diagnosis not present

## 2016-02-22 DIAGNOSIS — D631 Anemia in chronic kidney disease: Secondary | ICD-10-CM | POA: Diagnosis not present

## 2016-02-22 DIAGNOSIS — D509 Iron deficiency anemia, unspecified: Secondary | ICD-10-CM | POA: Diagnosis not present

## 2016-02-22 DIAGNOSIS — N186 End stage renal disease: Secondary | ICD-10-CM | POA: Diagnosis not present

## 2016-02-25 DIAGNOSIS — D509 Iron deficiency anemia, unspecified: Secondary | ICD-10-CM | POA: Diagnosis not present

## 2016-02-25 DIAGNOSIS — N186 End stage renal disease: Secondary | ICD-10-CM | POA: Diagnosis not present

## 2016-02-25 DIAGNOSIS — D631 Anemia in chronic kidney disease: Secondary | ICD-10-CM | POA: Diagnosis not present

## 2016-02-25 DIAGNOSIS — N2581 Secondary hyperparathyroidism of renal origin: Secondary | ICD-10-CM | POA: Diagnosis not present

## 2016-02-27 DIAGNOSIS — N186 End stage renal disease: Secondary | ICD-10-CM | POA: Diagnosis not present

## 2016-02-27 DIAGNOSIS — N2581 Secondary hyperparathyroidism of renal origin: Secondary | ICD-10-CM | POA: Diagnosis not present

## 2016-02-27 DIAGNOSIS — D509 Iron deficiency anemia, unspecified: Secondary | ICD-10-CM | POA: Diagnosis not present

## 2016-02-27 DIAGNOSIS — D631 Anemia in chronic kidney disease: Secondary | ICD-10-CM | POA: Diagnosis not present

## 2016-02-29 DIAGNOSIS — N186 End stage renal disease: Secondary | ICD-10-CM | POA: Diagnosis not present

## 2016-02-29 DIAGNOSIS — N2581 Secondary hyperparathyroidism of renal origin: Secondary | ICD-10-CM | POA: Diagnosis not present

## 2016-02-29 DIAGNOSIS — D631 Anemia in chronic kidney disease: Secondary | ICD-10-CM | POA: Diagnosis not present

## 2016-02-29 DIAGNOSIS — D509 Iron deficiency anemia, unspecified: Secondary | ICD-10-CM | POA: Diagnosis not present

## 2016-03-03 DIAGNOSIS — N186 End stage renal disease: Secondary | ICD-10-CM | POA: Diagnosis not present

## 2016-03-03 DIAGNOSIS — D509 Iron deficiency anemia, unspecified: Secondary | ICD-10-CM | POA: Diagnosis not present

## 2016-03-03 DIAGNOSIS — N2581 Secondary hyperparathyroidism of renal origin: Secondary | ICD-10-CM | POA: Diagnosis not present

## 2016-03-03 DIAGNOSIS — D631 Anemia in chronic kidney disease: Secondary | ICD-10-CM | POA: Diagnosis not present

## 2016-03-05 DIAGNOSIS — D631 Anemia in chronic kidney disease: Secondary | ICD-10-CM | POA: Diagnosis not present

## 2016-03-05 DIAGNOSIS — N186 End stage renal disease: Secondary | ICD-10-CM | POA: Diagnosis not present

## 2016-03-05 DIAGNOSIS — N2581 Secondary hyperparathyroidism of renal origin: Secondary | ICD-10-CM | POA: Diagnosis not present

## 2016-03-05 DIAGNOSIS — D509 Iron deficiency anemia, unspecified: Secondary | ICD-10-CM | POA: Diagnosis not present

## 2016-03-07 DIAGNOSIS — D509 Iron deficiency anemia, unspecified: Secondary | ICD-10-CM | POA: Diagnosis not present

## 2016-03-07 DIAGNOSIS — D631 Anemia in chronic kidney disease: Secondary | ICD-10-CM | POA: Diagnosis not present

## 2016-03-07 DIAGNOSIS — N2581 Secondary hyperparathyroidism of renal origin: Secondary | ICD-10-CM | POA: Diagnosis not present

## 2016-03-07 DIAGNOSIS — N186 End stage renal disease: Secondary | ICD-10-CM | POA: Diagnosis not present

## 2016-03-11 ENCOUNTER — Encounter: Payer: Self-pay | Admitting: Internal Medicine

## 2016-03-11 ENCOUNTER — Ambulatory Visit (INDEPENDENT_AMBULATORY_CARE_PROVIDER_SITE_OTHER): Payer: Medicare Other | Admitting: Internal Medicine

## 2016-03-11 VITALS — BP 162/85 | HR 76 | Temp 99.4°F | Ht 69.0 in | Wt 232.5 lb

## 2016-03-11 DIAGNOSIS — N186 End stage renal disease: Secondary | ICD-10-CM | POA: Diagnosis not present

## 2016-03-11 DIAGNOSIS — F17200 Nicotine dependence, unspecified, uncomplicated: Secondary | ICD-10-CM | POA: Diagnosis not present

## 2016-03-11 DIAGNOSIS — F411 Generalized anxiety disorder: Secondary | ICD-10-CM | POA: Diagnosis not present

## 2016-03-11 DIAGNOSIS — D631 Anemia in chronic kidney disease: Secondary | ICD-10-CM | POA: Diagnosis not present

## 2016-03-11 DIAGNOSIS — N2581 Secondary hyperparathyroidism of renal origin: Secondary | ICD-10-CM | POA: Diagnosis not present

## 2016-03-11 DIAGNOSIS — Z992 Dependence on renal dialysis: Secondary | ICD-10-CM | POA: Diagnosis not present

## 2016-03-11 DIAGNOSIS — D509 Iron deficiency anemia, unspecified: Secondary | ICD-10-CM | POA: Diagnosis not present

## 2016-03-11 MED ORDER — CITALOPRAM HYDROBROMIDE 10 MG PO TABS: 10.0000 mg | ORAL_TABLET | Freq: Every day | ORAL | 1 refills | Status: DC

## 2016-03-11 NOTE — Assessment & Plan Note (Addendum)
Assessment:  anxiety Patient has had episodes of sweating, palpitations and chest tightness lasting for approximately 10 minutes. He states that these episodes started from a recent hospitalization in July 2017.  On that admission he was found to have acute hypoxic respiratory distress secondary to pulmonary edema. Patient is anxious that he will experience another episode of difficulty breathing due to fluid in his lungs. The GAD-7 was administered today and he scored a 15.  Patient is at dry weight at 232lb and currently does not have symptoms of shortness of breath and lungs were clear to auscultation bilaterally on exam.  Plan: - Celexa 10 mg for 2 weeks followed by 20 mg for 2 weeks and stay at that dose - Follow-up in clinic in 1 month - advised patient to continue hemodialysis and stay compliant with blood pressure medications as this will help reduce chance of becoming fluid overloaded.

## 2016-03-11 NOTE — Progress Notes (Signed)
   CC: Anxiety  HPI:  [name] is a 50 y.o.male with past medical history noted below presents to [facility] for anxiety symptoms. He reports that his anxiety stems from a hospitalization in July where he was found to have fluid in his lungs causing him to be short of breath and hypoxic. He states that he is afraid of re-experiencing difficulty breathing as he did one month ago.  Patient states he has had episodes of sweating, palpitations and chest tightness that lasts for 10 minutes. He states he missed dialysis yesterday because he was restless and felt anxious. He was able to go to dialysis today after he took his blood pressure medication and a benadryl and stated that helped him not be as anxious. He reports being compliant on all his blood pressure medication.   Past Medical History:  Diagnosis Date  . Anemia   . Arthritis   . CHF (congestive heart failure) ([place])   . Chronic kidney disease    stage 3-4 CKD, followed by Dr. Moshe Cipro  . ESRD (end stage renal disease) ([place])    ESRD due to HTN started dialysis Feb 2016  . Gout   . Gout, unspecified 08/13/2009   Qualifier: Diagnosis of  By: Redmond Pulling  MD, Mateo Flow    . H1N1 influenza    March 2016  . History of gout   . History of syphilis   . HIV (human immunodeficiency virus infection) ([place])   . HIV infection ([place]) 1980's   on ART therapy since, followed by ID clinic, complicated  by neuropathy  . HTN (hypertension)   . Hyperlipidemia    hypertrygliceridemia determined ti be secondary to ART therpay  . Hypertension   . HYPERTENSION 05/08/2006  . 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 Pneumonia   . Rib fractures 01/2009  . Seizure disorder ([place])   . Seizures ([place])    last seizure was >5 years ago, pt has family history of seizures  . 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 Syphilis 1997     history of syphilis 1997  . Tobacco abuse     Review of Systems:  Review of Systems  Eyes: Negative for blurred vision.  Respiratory: Positive for shortness of breath. Negative for wheezing.   Cardiovascular: Positive for palpitations. Negative for chest pain.  Neurological: Positive for headaches. Negative for dizziness and focal weakness.     Physical Exam:  Vitals:   03/11/16 1518  BP: (!) 162/85  Pulse: 76  Temp: 99.4 F (37.4 C)  TempSrc: Oral  SpO2: 100%  Weight: 232 lb 8 oz (105.5 kg)  Height: 5\' 9"  (1.753 m)   Physical Exam  HENT:  Head: Normocephalic and atraumatic.  Cardiovascular: Normal rate and regular rhythm.   Pulmonary/Chest: Effort normal and breath sounds normal.  Abdominal: He exhibits no distension.  Musculoskeletal: He exhibits no edema.  Skin: Skin is warm and dry.    Assessment & Plan:   See encounters tab for problem based medical decision making.   Patient seen with Dr. Daryll Drown

## 2016-03-11 NOTE — Patient Instructions (Signed)
Mr. Crombie   Please start taking Celexa 1 tablet daily for 2 weeks then increase the dose to 2 tablets daily for 2 weeks and continue at that dose.  Please follow-up in our clinic in 1 month.  Please continue to take your blood pressure medication and attend dialysis sessions.  If symptoms worsen please call the clinic.  Thank you!

## 2016-03-12 ENCOUNTER — Encounter: Payer: Self-pay | Admitting: Internal Medicine

## 2016-03-12 DIAGNOSIS — N2581 Secondary hyperparathyroidism of renal origin: Secondary | ICD-10-CM | POA: Diagnosis not present

## 2016-03-12 DIAGNOSIS — N186 End stage renal disease: Secondary | ICD-10-CM | POA: Diagnosis not present

## 2016-03-12 DIAGNOSIS — D631 Anemia in chronic kidney disease: Secondary | ICD-10-CM | POA: Diagnosis not present

## 2016-03-12 DIAGNOSIS — D509 Iron deficiency anemia, unspecified: Secondary | ICD-10-CM | POA: Diagnosis not present

## 2016-03-14 DIAGNOSIS — N186 End stage renal disease: Secondary | ICD-10-CM | POA: Diagnosis not present

## 2016-03-14 DIAGNOSIS — N2581 Secondary hyperparathyroidism of renal origin: Secondary | ICD-10-CM | POA: Diagnosis not present

## 2016-03-14 DIAGNOSIS — D631 Anemia in chronic kidney disease: Secondary | ICD-10-CM | POA: Diagnosis not present

## 2016-03-14 DIAGNOSIS — D509 Iron deficiency anemia, unspecified: Secondary | ICD-10-CM | POA: Diagnosis not present

## 2016-03-14 NOTE — Progress Notes (Signed)
Internal Medicine Clinic Attending  I saw and evaluated the patient.  I personally confirmed the key portions of the history and exam documented by Dr. Hoffman and I reviewed pertinent patient test results.  The assessment, diagnosis, and plan were formulated together and I agree with the documentation in the resident's note.      

## 2016-03-17 DIAGNOSIS — N2581 Secondary hyperparathyroidism of renal origin: Secondary | ICD-10-CM | POA: Diagnosis not present

## 2016-03-17 DIAGNOSIS — D631 Anemia in chronic kidney disease: Secondary | ICD-10-CM | POA: Diagnosis not present

## 2016-03-17 DIAGNOSIS — N186 End stage renal disease: Secondary | ICD-10-CM | POA: Diagnosis not present

## 2016-03-17 DIAGNOSIS — D509 Iron deficiency anemia, unspecified: Secondary | ICD-10-CM | POA: Diagnosis not present

## 2016-03-19 DIAGNOSIS — D509 Iron deficiency anemia, unspecified: Secondary | ICD-10-CM | POA: Diagnosis not present

## 2016-03-19 DIAGNOSIS — D631 Anemia in chronic kidney disease: Secondary | ICD-10-CM | POA: Diagnosis not present

## 2016-03-19 DIAGNOSIS — N2581 Secondary hyperparathyroidism of renal origin: Secondary | ICD-10-CM | POA: Diagnosis not present

## 2016-03-19 DIAGNOSIS — N186 End stage renal disease: Secondary | ICD-10-CM | POA: Diagnosis not present

## 2016-03-20 DIAGNOSIS — I120 Hypertensive chronic kidney disease with stage 5 chronic kidney disease or end stage renal disease: Secondary | ICD-10-CM | POA: Diagnosis not present

## 2016-03-20 DIAGNOSIS — N186 End stage renal disease: Secondary | ICD-10-CM | POA: Diagnosis not present

## 2016-03-20 DIAGNOSIS — Z992 Dependence on renal dialysis: Secondary | ICD-10-CM | POA: Diagnosis not present

## 2016-03-21 DIAGNOSIS — D631 Anemia in chronic kidney disease: Secondary | ICD-10-CM | POA: Diagnosis not present

## 2016-03-21 DIAGNOSIS — N2581 Secondary hyperparathyroidism of renal origin: Secondary | ICD-10-CM | POA: Diagnosis not present

## 2016-03-21 DIAGNOSIS — N186 End stage renal disease: Secondary | ICD-10-CM | POA: Diagnosis not present

## 2016-03-24 DIAGNOSIS — N2581 Secondary hyperparathyroidism of renal origin: Secondary | ICD-10-CM | POA: Diagnosis not present

## 2016-03-24 DIAGNOSIS — D631 Anemia in chronic kidney disease: Secondary | ICD-10-CM | POA: Diagnosis not present

## 2016-03-24 DIAGNOSIS — N186 End stage renal disease: Secondary | ICD-10-CM | POA: Diagnosis not present

## 2016-03-26 DIAGNOSIS — N186 End stage renal disease: Secondary | ICD-10-CM | POA: Diagnosis not present

## 2016-03-26 DIAGNOSIS — D631 Anemia in chronic kidney disease: Secondary | ICD-10-CM | POA: Diagnosis not present

## 2016-03-26 DIAGNOSIS — N2581 Secondary hyperparathyroidism of renal origin: Secondary | ICD-10-CM | POA: Diagnosis not present

## 2016-03-28 DIAGNOSIS — D631 Anemia in chronic kidney disease: Secondary | ICD-10-CM | POA: Diagnosis not present

## 2016-03-28 DIAGNOSIS — N186 End stage renal disease: Secondary | ICD-10-CM | POA: Diagnosis not present

## 2016-03-28 DIAGNOSIS — N2581 Secondary hyperparathyroidism of renal origin: Secondary | ICD-10-CM | POA: Diagnosis not present

## 2016-03-31 ENCOUNTER — Observation Stay (HOSPITAL_COMMUNITY)
Admission: EM | Admit: 2016-03-31 | Discharge: 2016-04-01 | Disposition: A | Discharge status: Home / Self Care | Payer: Medicare Other | Attending: Student in an Organized Health Care Education/Training Program | Admitting: Student in an Organized Health Care Education/Training Program

## 2016-03-31 ENCOUNTER — Emergency Department (HOSPITAL_COMMUNITY): Payer: Medicare Other

## 2016-03-31 ENCOUNTER — Encounter (HOSPITAL_COMMUNITY): Payer: Self-pay | Admitting: *Deleted

## 2016-03-31 DIAGNOSIS — M109 Gout, unspecified: Secondary | ICD-10-CM | POA: Insufficient documentation

## 2016-03-31 DIAGNOSIS — B20 Human immunodeficiency virus [HIV] disease: Secondary | ICD-10-CM | POA: Diagnosis not present

## 2016-03-31 DIAGNOSIS — R079 Chest pain, unspecified: Secondary | ICD-10-CM

## 2016-03-31 DIAGNOSIS — E781 Pure hyperglyceridemia: Secondary | ICD-10-CM | POA: Diagnosis not present

## 2016-03-31 DIAGNOSIS — I509 Heart failure, unspecified: Secondary | ICD-10-CM | POA: Diagnosis not present

## 2016-03-31 DIAGNOSIS — J45998 Other asthma: Principal | ICD-10-CM | POA: Insufficient documentation

## 2016-03-31 DIAGNOSIS — R0789 Other chest pain: Secondary | ICD-10-CM

## 2016-03-31 DIAGNOSIS — F17210 Nicotine dependence, cigarettes, uncomplicated: Secondary | ICD-10-CM | POA: Diagnosis not present

## 2016-03-31 DIAGNOSIS — D631 Anemia in chronic kidney disease: Secondary | ICD-10-CM | POA: Diagnosis not present

## 2016-03-31 DIAGNOSIS — I120 Hypertensive chronic kidney disease with stage 5 chronic kidney disease or end stage renal disease: Secondary | ICD-10-CM | POA: Diagnosis not present

## 2016-03-31 DIAGNOSIS — Z79899 Other long term (current) drug therapy: Secondary | ICD-10-CM | POA: Insufficient documentation

## 2016-03-31 DIAGNOSIS — I132 Hypertensive heart and chronic kidney disease with heart failure and with stage 5 chronic kidney disease, or end stage renal disease: Secondary | ICD-10-CM | POA: Diagnosis not present

## 2016-03-31 DIAGNOSIS — N186 End stage renal disease: Secondary | ICD-10-CM | POA: Diagnosis not present

## 2016-03-31 DIAGNOSIS — Z8619 Personal history of other infectious and parasitic diseases: Secondary | ICD-10-CM | POA: Insufficient documentation

## 2016-03-31 DIAGNOSIS — Z992 Dependence on renal dialysis: Secondary | ICD-10-CM | POA: Diagnosis not present

## 2016-03-31 DIAGNOSIS — R072 Precordial pain: Secondary | ICD-10-CM | POA: Diagnosis not present

## 2016-03-31 DIAGNOSIS — F329 Major depressive disorder, single episode, unspecified: Secondary | ICD-10-CM | POA: Diagnosis not present

## 2016-03-31 DIAGNOSIS — E785 Hyperlipidemia, unspecified: Secondary | ICD-10-CM | POA: Diagnosis not present

## 2016-03-31 DIAGNOSIS — R0602 Shortness of breath: Secondary | ICD-10-CM

## 2016-03-31 LAB — CBC WITH DIFFERENTIAL/PLATELET
Basophils Absolute: 0 10*3/uL (ref 0.0–0.1)
Basophils Relative: 0 %
EOS PCT: 3 %
Eosinophils Absolute: 0.3 10*3/uL (ref 0.0–0.7)
HCT: 33.1 % — ABNORMAL LOW (ref 39.0–52.0)
HEMOGLOBIN: 10.4 g/dL — AB (ref 13.0–17.0)
LYMPHS PCT: 20 %
Lymphs Abs: 2.1 10*3/uL (ref 0.7–4.0)
MCH: 27.8 pg (ref 26.0–34.0)
MCHC: 31.4 g/dL (ref 30.0–36.0)
MCV: 88.5 fL (ref 78.0–100.0)
Monocytes Absolute: 0.6 10*3/uL (ref 0.1–1.0)
Monocytes Relative: 6 %
NEUTROS PCT: 71 %
Neutro Abs: 7.6 10*3/uL (ref 1.7–7.7)
Platelets: 177 10*3/uL (ref 150–400)
RBC: 3.74 MIL/uL — AB (ref 4.22–5.81)
RDW: 15.1 % (ref 11.5–15.5)
WBC: 10.6 10*3/uL — AB (ref 4.0–10.5)

## 2016-03-31 LAB — BASIC METABOLIC PANEL
Anion gap: 11 (ref 5–15)
BUN: 73 mg/dL — AB (ref 6–20)
CHLORIDE: 101 mmol/L (ref 101–111)
CO2: 24 mmol/L (ref 22–32)
Calcium: 9.1 mg/dL (ref 8.9–10.3)
Creatinine, Ser: 11.64 mg/dL — ABNORMAL HIGH (ref 0.61–1.24)
GFR calc Af Amer: 5 mL/min — ABNORMAL LOW (ref >60)
GFR calc non Af Amer: 4 mL/min — ABNORMAL LOW (ref >60)
GLUCOSE: 86 mg/dL (ref 65–99)
POTASSIUM: 3.7 mmol/L (ref 3.5–5.1)
Sodium: 136 mmol/L (ref 135–145)

## 2016-03-31 LAB — I-STAT TROPONIN, ED: Troponin i, poc: 0.01 ng/mL (ref 0.00–0.08)

## 2016-03-31 LAB — TROPONIN I
Troponin I: 0.03 ng/mL (ref <0.03)
Troponin I: 0.03 ng/mL (ref <0.03)

## 2016-03-31 MED ORDER — RILPIVIRINE HCL 25 MG PO TABS
25.0000 mg | ORAL_TABLET | Freq: Every day | ORAL | Status: DC
  Administered 2016-03-31 – 2016-04-01 (×2): 25 mg via ORAL
  Filled 2016-03-31 (×2): qty 1

## 2016-03-31 MED ORDER — ACETAMINOPHEN 650 MG RE SUPP
650.0000 mg | Freq: Four times a day (QID) | RECTAL | Status: DC | PRN
Start: 2016-03-31 — End: 2016-04-01

## 2016-03-31 MED ORDER — IPRATROPIUM BROMIDE 0.02 % IN SOLN
0.5000 mg | Freq: Once | RESPIRATORY_TRACT | Status: AC
  Administered 2016-03-31: 0.5 mg via RESPIRATORY_TRACT
  Filled 2016-03-31: qty 2.5

## 2016-03-31 MED ORDER — SULFAMETHOXAZOLE-TRIMETHOPRIM 800-160 MG PO TABS
1.0000 | ORAL_TABLET | Freq: Every day | ORAL | Status: DC
  Administered 2016-03-31 – 2016-04-01 (×2): 1 via ORAL
  Filled 2016-03-31 (×2): qty 1

## 2016-03-31 MED ORDER — TENOFOVIR DISOPROXIL FUMARATE 300 MG PO TABS: 300.0000 mg | ORAL_TABLET | ORAL | Status: DC

## 2016-03-31 MED ORDER — ACETAMINOPHEN 500 MG PO TABS
1000.0000 mg | ORAL_TABLET | Freq: Once | ORAL | Status: AC
  Administered 2016-03-31: 1000 mg via ORAL
  Filled 2016-03-31: qty 2

## 2016-03-31 MED ORDER — DEXTROSE 5 % IV SOLN
1.0000 g | Freq: Once | INTRAVENOUS | Status: AC
  Administered 2016-03-31: 1 g via INTRAVENOUS
  Filled 2016-03-31: qty 1

## 2016-03-31 MED ORDER — ALTEPLASE 2 MG IJ SOLR: 2.0000 mg | Freq: Once | INTRAMUSCULAR | Status: DC | PRN

## 2016-03-31 MED ORDER — CARVEDILOL 25 MG PO TABS
25.0000 mg | ORAL_TABLET | Freq: Two times a day (BID) | ORAL | Status: DC
  Administered 2016-03-31 – 2016-04-01 (×3): 25 mg via ORAL
  Filled 2016-03-31 (×2): qty 1

## 2016-03-31 MED ORDER — ACETAMINOPHEN 325 MG PO TABS
650.0000 mg | ORAL_TABLET | Freq: Four times a day (QID) | ORAL | Status: DC | PRN
  Administered 2016-03-31 – 2016-04-01 (×3): 650 mg via ORAL
  Filled 2016-03-31 (×3): qty 2

## 2016-03-31 MED ORDER — DOXERCALCIFEROL 4 MCG/2ML IV SOLN
2.0000 ug | INTRAVENOUS | Status: DC
  Administered 2016-03-31: 2 ug via INTRAVENOUS
  Filled 2016-03-31: qty 2

## 2016-03-31 MED ORDER — SEVELAMER CARBONATE 800 MG PO TABS
2400.0000 mg | ORAL_TABLET | Freq: Three times a day (TID) | ORAL | Status: DC
  Administered 2016-03-31 – 2016-04-01 (×3): 2400 mg via ORAL
  Filled 2016-03-31 (×3): qty 3

## 2016-03-31 MED ORDER — PENTAFLUOROPROP-TETRAFLUOROETH EX AERO: 1.0000 "application " | INHALATION_SPRAY | CUTANEOUS | Status: DC | PRN

## 2016-03-31 MED ORDER — LIDOCAINE-PRILOCAINE 2.5-2.5 % EX CREA: 1.0000 "application " | TOPICAL_CREAM | CUTANEOUS | Status: DC | PRN

## 2016-03-31 MED ORDER — DOLUTEGRAVIR SODIUM 50 MG PO TABS
50.0000 mg | ORAL_TABLET | Freq: Every day | ORAL | Status: DC
  Administered 2016-03-31 – 2016-04-01 (×2): 50 mg via ORAL
  Filled 2016-03-31 (×2): qty 1

## 2016-03-31 MED ORDER — SODIUM CHLORIDE 0.9 % IV SOLN
8.0000 mg | Freq: Once | INTRAVENOUS | Status: DC
  Filled 2016-03-31: qty 4

## 2016-03-31 MED ORDER — AMLODIPINE BESYLATE 10 MG PO TABS
10.0000 mg | ORAL_TABLET | Freq: Every day | ORAL | Status: DC
  Administered 2016-03-31: 10 mg via ORAL
  Filled 2016-03-31: qty 1

## 2016-03-31 MED ORDER — DIPHENHYDRAMINE HCL 50 MG/ML IJ SOLN: 25.0000 mg | Freq: Once | INTRAMUSCULAR | Status: DC

## 2016-03-31 MED ORDER — DOXAZOSIN MESYLATE 8 MG PO TABS
8.0000 mg | ORAL_TABLET | Freq: Every day | ORAL | Status: DC
  Administered 2016-03-31: 8 mg via ORAL
  Filled 2016-03-31: qty 1

## 2016-03-31 MED ORDER — SULFAMETHOXAZOLE-TRIMETHOPRIM 400-80 MG PO TABS: 1.0000 | ORAL_TABLET | Freq: Two times a day (BID) | ORAL | Status: DC

## 2016-03-31 MED ORDER — IPRATROPIUM-ALBUTEROL 0.5-2.5 (3) MG/3ML IN SOLN: 3.0000 mL | Freq: Four times a day (QID) | RESPIRATORY_TRACT | Status: DC | PRN

## 2016-03-31 MED ORDER — CITALOPRAM HYDROBROMIDE 20 MG PO TABS
20.0000 mg | ORAL_TABLET | Freq: Every day | ORAL | Status: DC
  Administered 2016-03-31 – 2016-04-01 (×2): 20 mg via ORAL
  Filled 2016-03-31 (×2): qty 1

## 2016-03-31 MED ORDER — SODIUM CHLORIDE 0.9% FLUSH
3.0000 mL | Freq: Two times a day (BID) | INTRAVENOUS | Status: DC
  Administered 2016-03-31: 3 mL via INTRAVENOUS

## 2016-03-31 MED ORDER — SODIUM CHLORIDE 0.9 % IV SOLN: 100.0000 mL | INTRAVENOUS | Status: DC | PRN

## 2016-03-31 MED ORDER — FEBUXOSTAT 40 MG PO TABS
80.0000 mg | ORAL_TABLET | Freq: Every day | ORAL | Status: DC
  Administered 2016-03-31 – 2016-04-01 (×2): 80 mg via ORAL
  Filled 2016-03-31 (×2): qty 2

## 2016-03-31 MED ORDER — FLUTICASONE PROPIONATE 50 MCG/ACT NA SUSP
2.0000 | Freq: Every day | NASAL | Status: DC
  Administered 2016-03-31: 2 via NASAL
  Filled 2016-03-31 (×2): qty 16

## 2016-03-31 MED ORDER — HEPARIN SODIUM (PORCINE) 5000 UNIT/ML IJ SOLN
5000.0000 [IU] | Freq: Three times a day (TID) | INTRAMUSCULAR | Status: DC
  Administered 2016-03-31 – 2016-04-01 (×2): 5000 [IU] via SUBCUTANEOUS
  Filled 2016-03-31 (×2): qty 1

## 2016-03-31 MED ORDER — HEPARIN SODIUM (PORCINE) 1000 UNIT/ML DIALYSIS: 6000.0000 [IU] | Freq: Once | INTRAMUSCULAR | Status: DC

## 2016-03-31 MED ORDER — VANCOMYCIN HCL 10 G IV SOLR
2000.0000 mg | Freq: Once | INTRAVENOUS | Status: AC
  Administered 2016-03-31: 2000 mg via INTRAVENOUS
  Filled 2016-03-31: qty 2000

## 2016-03-31 MED ORDER — LIDOCAINE HCL (PF) 1 % IJ SOLN: 5.0000 mL | INTRAMUSCULAR | Status: DC | PRN

## 2016-03-31 MED ORDER — HEPARIN SODIUM (PORCINE) 1000 UNIT/ML DIALYSIS: 1000.0000 [IU] | INTRAMUSCULAR | Status: DC | PRN

## 2016-03-31 MED ORDER — DOXERCALCIFEROL 4 MCG/2ML IV SOLN
INTRAVENOUS | Status: AC
  Administered 2016-03-31: 2 ug via INTRAVENOUS
  Filled 2016-03-31: qty 2

## 2016-03-31 MED ORDER — HYDRALAZINE HCL 10 MG PO TABS
10.0000 mg | ORAL_TABLET | Freq: Three times a day (TID) | ORAL | Status: DC
  Administered 2016-03-31 (×2): 10 mg via ORAL
  Filled 2016-03-31 (×2): qty 1

## 2016-03-31 MED ORDER — ALBUTEROL SULFATE (2.5 MG/3ML) 0.083% IN NEBU
5.0000 mg | INHALATION_SOLUTION | Freq: Once | RESPIRATORY_TRACT | Status: AC
  Administered 2016-03-31: 5 mg via RESPIRATORY_TRACT
  Filled 2016-03-31: qty 6

## 2016-03-31 MED ORDER — ASPIRIN EC 81 MG PO TBEC
81.0000 mg | DELAYED_RELEASE_TABLET | Freq: Every day | ORAL | Status: DC
  Administered 2016-03-31 – 2016-04-01 (×2): 81 mg via ORAL
  Filled 2016-03-31 (×2): qty 1

## 2016-03-31 MED ORDER — MONTELUKAST SODIUM 10 MG PO TABS
10.0000 mg | ORAL_TABLET | Freq: Every day | ORAL | Status: DC
  Administered 2016-03-31: 10 mg via ORAL
  Filled 2016-03-31 (×2): qty 1

## 2016-03-31 MED ORDER — IPRATROPIUM-ALBUTEROL 0.5-2.5 (3) MG/3ML IN SOLN
3.0000 mL | Freq: Four times a day (QID) | RESPIRATORY_TRACT | Status: DC
  Administered 2016-03-31 – 2016-04-01 (×3): 3 mL via RESPIRATORY_TRACT
  Filled 2016-03-31 (×3): qty 3

## 2016-03-31 MED ORDER — GUAIFENESIN ER 600 MG PO TB12
600.0000 mg | ORAL_TABLET | Freq: Two times a day (BID) | ORAL | Status: DC
  Administered 2016-03-31 – 2016-04-01 (×3): 600 mg via ORAL
  Filled 2016-03-31 (×3): qty 1

## 2016-03-31 NOTE — Progress Notes (Signed)
   Subjective: No acute events overnight.  No further chest pain or dyspnea, no fevers.  Objective:  Vital signs in last 24 hours: Vitals:   03/31/16 0530 03/31/16 0600 03/31/16 0630 03/31/16 0746  BP: 162/86 154/87 163/79 (!) 173/91  Pulse: 77 69 76 73  Resp: 12 14 17 16   Temp:    98.5 F (36.9 C)  TempSrc:    Oral  SpO2: 100% 100% 100% 99%  Weight:    235 lb 8 oz (106.8 kg)  Height:    5\' 9"  (1.753 m)   Physical Exam  Constitutional: He is oriented to person, place, and time. He appears well-developed and well-nourished.  Cardiovascular: Normal rate and regular rhythm.   Pulmonary/Chest:  Diffuse wheezes, good air movement, no respiratory distress  Abdominal: Soft. He exhibits no distension. There is no tenderness.  Musculoskeletal:  Trace biLE edema  Neurological: He is alert and oriented to person, place, and time.  Skin: Skin is warm and dry.  Psychiatric: He has a normal mood and affect. His behavior is normal.   Cardiac Panel (last 3 results)  Recent Labs  03/31/16 0243  TROPONINI 0.03*    Assessment/Plan:  Active Problems:   Chest pain  #Chest Pain EKG without acute ischemic changes, low detectable initial troponin not suggestive of MI in dialysis patient.  Low risk MPI stress study in 11/2015.  However, his ASCVD risk is elevated with 20.5% 10 year risk.  Was previously on pravastatin, unclear why discontinued. -Follow troponins to finish ACS ruleout -Discuss statin for discharge  #Post-Infectious Reactive Airways He is afebrile with no leukocytosis and normal CXR.  With his recent URI symptoms, his cough is likely post-infectious in etiology.  Will treat symptomatically. -Guaifenisin -Nasal fluticasone -DuoNebs as needed  #ESRD -Dialysis today  Dispo: Anticipated discharge in approximately 1 day(s).   Minus Liberty, MD 03/31/2016, 9:56 AM Pager: [phone]

## 2016-03-31 NOTE — ED Notes (Signed)
Attempted report 

## 2016-03-31 NOTE — Care Management Obs Status (Signed)
[place] NOTIFICATION   Patient Details  Name: HAKOP HUMBARGER MRN: [id number] Date of Birth: [birth year]   Medicare Observation Status Notification Given:  Yes    MayoKym Groom, RN 03/31/2016, 1:35 PM

## 2016-03-31 NOTE — ED Provider Notes (Signed)
By signing my name below, I, Dolores Hoose, attest that this documentation has been prepared under the direction and in the presence of Rolland Porter, MD . Electronically Signed: Dolores Hoose, Scribe. 03/31/2016. 3:42 AM.  Darrell Johnson is a 50 yo male with PMHx of CHF and HIV who presents to the emergency room complaining of sudden-onset unchanged constant shortness of breath beginning 3 hours ago. Pt endorses associated chest pain on the right and central chest. He denies any fever, chills or recent coughing.   Physical Exam: Rhonchi and coarse breath sounds especially on the right. He has egophony on the right. Increased tactile fremitus.    Medical screening examination/treatment/procedure(s) were conducted as a shared visit with non-physician practitioner(s) and myself.  I personally evaluated the patient during the encounter.   EKG Interpretation  Date/Time:  Monday March 31 2016 02:29:01 EDT Ventricular Rate:  73 PR Interval:    QRS Duration: 99 QT Interval:  407 QTC Calculation: 449 R Axis:   77 Text Interpretation:  Sinus rhythm Atrial premature complex ST elevation in Anterior leads No significant change since 06 Aug 2002 Confirmed by [facility]  MD-I, Jerianne Anselmo ([id number]) on 03/31/2016 2:37:54 AM       Rolland Porter, MD, FACEP  I personally performed the services described in this documentation, which was scribed in my presence. The recorded information has been reviewed and considered.  Rolland Porter, MD, Barbette Or, MD 03/31/16 [phone]

## 2016-03-31 NOTE — Progress Notes (Deleted)
Date: 03/31/2016               Patient Name:  Darrell Johnson MRN: [id number]  DOB: Apr 19, 1966 Age / Sex: 50 y.o., male   PCP: Jule Ser, DO         Medical Service: Internal Medicine Teaching Service         Attending Physician: Dr. Axel Filler, MD    First Contact: Dr. Marjory Sneddon  Pager: [phone]  Second Contact: Dr. Martyn Malay Pager: [phone]       After Hours (After 5p/  First Contact Pager: [phone]  weekends / holidays): Second Contact Pager: [phone]   Chief Complaint: Chest pain, Shortness of breath   History of Present Illness: Patient is a 50 yo M with a pmhx of HIV (last CD4 54 in March 2017 and RNA undetectable), HTN, ESRD on HD (M/W/F), and panic disorder who presents with chest pain and shortness of breath that woke him from sleep. Patient was in his usual state of health until this past evening around midnight when he woke suddenly with left sided chest pain and SOB. He describes the chest pain as sharp. It is non radiating and he denies any aggravating characteristics. Chest pain was relieved by SL nitro given by EMS. He currently denies chest pain in ED and feels that SOB has improved. Patient says he had a panic attack about 3 weeks ago with SOB, but this episode feels differently because it was accompanied with chest pain. He has never had chest pain like this before. He denies any cough or fevers at home but does report some nasal congestion and drainage with post nasal drip. He reports compliance with all of this home meds but did miss a dose of his HIV medicine last Thursday. He says he has been around a few babies recently with respiratory infections, but denies other sick contacts. BM have been regular. Patient still makes some urine but denies any dysuria. His last HD session was last Friday which he completed without complication. He reports compliance with his renal diet and denies any dietary indiscretion. No lower extremity edema.   In the  ED, vitals were stable (T 99.4 F, BP 162/85, HR 76, RR 15, 100% on RA). Labs were significant for a wbc of 10.6 and hgb of 10.4. BMP with creatinine of 11.6 and BUN 73. I-stat troponin was 0.01 but Troponin I elevated to 0.03. EKG showed some ST segment elevation in anterior leads V2 and V3, largely unchanged from prior tracing. CXR negative for acute cardiopulmonary findings.   Meds:  Current Meds  Medication Sig  . carvedilol (COREG) 25 MG tablet TAKE 1 TABLET (25 MG TOTAL) BY MOUTH 2 (TWO) TIMES DAILY WITH A MEAL.  . citalopram (CELEXA) 10 MG tablet Take 1 tablet (10 mg total) by mouth daily. 1 tablet daily for 2 weeks, then 2 tablets daily for 2 weeks and continue at that dose. (Patient taking differently: Take 20 mg by mouth daily. )  . dolutegravir (TIVICAY) 50 MG tablet Take 1 tablet (50 mg total) by mouth daily.  Marland Kitchen doxazosin (CARDURA) 8 MG tablet TAKE 1 TABLET (8 MG TOTAL) BY MOUTH AT BEDTIME.  . Febuxostat (ULORIC) 80 MG TABS Take 1 tablet by mouth daily.  . furosemide (LASIX) 40 MG tablet TAKE 1 TABLET (40 MG TOTAL) BY MOUTH DAILY.  . hydrALAZINE (APRESOLINE) 10 MG tablet Take 1 tablet (10 mg total) by mouth 3 (three) times daily.  Marland Kitchen  montelukast (SINGULAIR) 10 MG tablet Take 10 mg by mouth at bedtime.  Marland Kitchen RENVELA 800 MG tablet Take 2,400 mg by mouth 3 (three) times daily with meals.   . rilpivirine (EDURANT) 25 MG TABS tablet Take 1 tablet (25 mg total) by mouth daily with breakfast.  . tenofovir (VIREAD) 300 MG tablet TAKE 1 TABLET BY MOUTH ONCE A WEEK (Patient taking differently: Take 300 mg by mouth once a week. Sunday)     Allergies: Allergies as of 03/31/2016  . (No Known Allergies)   Past Medical History:  Diagnosis Date  . Anemia   . Arthritis   . CHF (congestive heart failure) ([place])   . Chronic kidney disease    stage 3-4 CKD, followed by Dr. Moshe Cipro  . ESRD (end stage renal disease) ([place])    ESRD due to HTN started dialysis Feb 2016  . Gout   . Gout,  unspecified 08/13/2009   Qualifier: Diagnosis of  By: Redmond Pulling  MD, Mateo Flow    . H1N1 influenza    March 2016  . History of gout   . History of syphilis   . HIV (human immunodeficiency virus infection) ([place])   . HIV infection ([place]) 1980's   on ART therapy since, followed by ID clinic, complicated  by neuropathy  . HTN (hypertension)   . Hyperlipidemia    hypertrygliceridemia determined ti be secondary to ART therpay  . Hypertension   . HYPERTENSION 05/08/2006  . 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 Pneumonia   . Rib fractures 01/2009  . Seizure disorder ([place])   . Seizures ([place])    last seizure was >5 years ago, pt has family history of seizures  . 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 Syphilis 1997   history of syphilis 1997  . Tobacco abuse     Family History: HTN, and dialysis in younger brother   Social History: Lives at home with his wife. Denies alcohol use. Chronic tobacco use, 2-3 cigarettes a day down from 8-10 previously. Denies other illicit drug use.   Review of Systems: A complete ROS was negative except as per HPI.   Physical Exam: Blood pressure 162/86, pulse 77, temperature 97.7 F (36.5 C), temperature source Oral, resp. rate 12, SpO2 100 %. Physical Exam Constitutional: NAD, appears comfortable HEENT: Atraumatic, normocephalic. PERRL, anicteric sclera.  Neck: Supple, trachea midline.  Cardiovascular: RRR, no murmurs, rubs, or gallops.  Pulmonary/Chest: Diffuse wheezing, bibasilar crackles and rhonchi (L>R). No chest wall abnormalities.  Abdominal: Soft, non tender, distended due to habitus. +BS.  Extremities: Warm and well perfused. Distal pulses intact. Trace pitting edema bilateral ankles.  Neurological: A&Ox3, CN II - XII grossly intact.  Skin: No rashes or erythema  Psychiatric: Normal mood and affect  EKG: Normal  sinus rhythm with ST elevation in V2 and V3, unchanged in comparison to prior tracing   CXR:  IMPRESSION: No acute cardiopulmonary findings.  Assessment & Plan by Problem:  Chest pain/SOB: Atypical in presentation however patient with multiple coronary risk factors (obesity, chronic tobacco use, HTN, and HLD) and chest pain relieved with SL nitro. Troponin I elevated to 0.03 and EKG with ST elevation in leads V2 and V3, however unchanged from prior tracing. Diffuse wheezing, crackles, and rhonchi on physical exam. Patient reports symptoms of URI with nasal congestions and drainage, but denies cough and fevers at  home. History of HIV, last CD4 count 570. Reports exposure to infants with cold symptoms. Etiology concerning for ACS vs. PNA vs. Bronchitis.  -- Trend troponins q6hrs to r/o ACS  -- Admit for tele monitoring  -- Received cefepime, bactrim, and vancomycin in ED -- Albuterol nebulizer and ipratropium x 1 in ED   ESRD on HD (M/W/F): BP stable and electrolytes normal. No signs of volume overload at this time. Last HD session was on Friday which patient completed without incident. Due for HD this morning at 5 am. Patient is being [facility] at [facility] for renal [facility].  -- Nephrology consult for HD -- Strict I&Os -- Daily weights  -- Renal diet with fluid restriction  -- Continue renvela TID  HTN: -- Continue amlodipine 10 mg QHS, coreg 25 mg BID, hydralazine 10 mg TID, and doxazosin 8mg  QHS -- Hold home lasix   HIV: -- Continue tenofovir (viread), rilpivirine (edurant), and dolutegravir (tivicay) daily   Depression: -- Continue celexa 20 mg daily    Dispo: Admit patient to Observation with expected length of stay less than 2 midnights.  Signed: Velna Ochs, MD 03/31/2016, 6:29 AM  Pager: [phone]

## 2016-03-31 NOTE — ED Triage Notes (Signed)
The pt arrived by gems from home  C/o sob and mid-chest pain non-radiating lungs clear bi-laterally.  Pt is a dialysis pt that was last  Dialyzed Friday.  Alert orineted skin warm and d ry.  Ems gave him 325mg  of aspirin and sl nitro x4

## 2016-03-31 NOTE — ED Triage Notes (Signed)
Ems placed nasal 02 on the pt  Breathing better on his arrival  cbg 106

## 2016-03-31 NOTE — H&P (Signed)
Date: 03/31/2016               Patient Name:  Darrell Johnson MRN: [id number]  DOB: [birth year] Age / Sex: 50 y.o., male   PCP: Jule Ser, DO         Medical Service: Internal Medicine Teaching Service         Attending Physician: Dr. Axel Filler, MD    First Contact: Dr. Marjory Sneddon  Pager: [phone]  Second Contact: Dr. Martyn Malay Pager: [phone]       After Hours (After 5p/  First Contact Pager: [phone]  weekends / holidays): Second Contact Pager: [phone]   Chief Complaint: Chest pain, Shortness of breath   History of Present Illness: Patient is a 50 yo M with a pmhx of HIV (last CD4 2 in March 2017 and RNA undetectable), HTN, ESRD on HD (M/W/F), and panic disorder who presents with chest pain and shortness of breath that woke him from sleep. Patient was in his usual state of health until this past evening around midnight when he woke suddenly with left sided chest pain and SOB. He describes the chest pain as sharp. It is non radiating and he denies any aggravating characteristics. Chest pain was relieved by SL nitro given by EMS. He currently denies chest pain in ED and feels that SOB has improved. Patient says he had a panic attack about 3 weeks ago with SOB, but this episode feels differently because it was accompanied with chest pain. He has never had chest pain like this before. He denies any cough or fevers at home but does report some nasal congestion and drainage with post nasal drip. He reports compliance with all of this home meds but did miss a dose of his HIV medicine last Thursday. He says he has been around a few babies recently with respiratory infections, but denies other sick contacts. BM have been regular. Patient still makes some urine but denies any dysuria. His last HD session was last Friday which he completed without complication. He reports compliance with his renal diet and denies any dietary indiscretion. No  lower extremity edema.   In the ED, vitals were stable (T 99.4 F, BP 162/85, HR 76, RR 15, 100% on RA). Labs were significant for a wbc of 10.6 and hgb of 10.4. BMP with creatinine of 11.6 and BUN 73. I-stat troponin was 0.01 but Troponin I elevated to 0.03. EKG showed J-point elevation in anterior leads V2 and V3, largely unchanged from prior tracing. CXR negative for acute cardiopulmonary findings.   Meds:  Active Medications      Current Meds  Medication Sig  . carvedilol (COREG) 25 MG tablet TAKE 1 TABLET (25 MG TOTAL) BY MOUTH 2 (TWO) TIMES DAILY WITH A MEAL.  . citalopram (CELEXA) 10 MG tablet Take 1 tablet (10 mg total) by mouth daily. 1 tablet daily for 2 weeks, then 2 tablets daily for 2 weeks and continue at that dose. (Patient taking differently: Take 20 mg by mouth daily. )  . dolutegravir (TIVICAY) 50 MG tablet Take 1 tablet (50 mg total) by mouth daily.  Marland Kitchen doxazosin (CARDURA) 8 MG tablet TAKE 1 TABLET (8 MG TOTAL) BY MOUTH AT BEDTIME.  . Febuxostat (ULORIC) 80 MG TABS Take 1 tablet by mouth daily.  . furosemide (LASIX) 40 MG tablet TAKE 1 TABLET (40 MG TOTAL) BY MOUTH DAILY.  . hydrALAZINE (APRESOLINE) 10 MG tablet Take 1 tablet (10 mg total) by mouth 3 (  three) times daily.  . montelukast (SINGULAIR) 10 MG tablet Take 10 mg by mouth at bedtime.  Marland Kitchen RENVELA 800 MG tablet Take 2,400 mg by mouth 3 (three) times daily with meals.   . rilpivirine (EDURANT) 25 MG TABS tablet Take 1 tablet (25 mg total) by mouth daily with breakfast.  . tenofovir (VIREAD) 300 MG tablet TAKE 1 TABLET BY MOUTH ONCE A WEEK (Patient taking differently: Take 300 mg by mouth once a week. Sunday)       Allergies:    Allergies as of 03/31/2016  . (No Known Allergies)       Past Medical History:  Diagnosis Date  . Anemia   . Arthritis   . CHF (congestive heart failure) ([place])   . Chronic kidney disease    stage 3-4 CKD, followed by Dr. Moshe Cipro  . ESRD (end stage renal disease) ([place])     ESRD due to HTN started dialysis Feb 2016  . Gout   . Gout, unspecified 08/13/2009   Qualifier: Diagnosis of  By: Redmond Pulling  MD, Mateo Flow    . H1N1 influenza    March 2016  . History of gout   . History of syphilis   . HIV (human immunodeficiency virus infection) ([place])   . HIV infection ([place]) 1980's   on ART therapy since, followed by ID clinic, complicated  by neuropathy  . HTN (hypertension)   . Hyperlipidemia    hypertrygliceridemia determined ti be secondary to ART therpay  . Hypertension   . HYPERTENSION 05/08/2006  . 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 Pneumonia   . Rib fractures 01/2009  . Seizure disorder ([place])   . Seizures ([place])    last seizure was >5 years ago, pt has family history of seizures  . 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 Syphilis 1997   history of syphilis 1997  . Tobacco abuse     Family History: HTN, and dialysis in younger brother   Social History: Lives at home with his wife. Denies alcohol use. Chronic tobacco use, 2-3 cigarettes a day down from 8-10 previously. Denies other illicit drug use.   Review of Systems: A complete ROS was negative except as per HPI.   Physical Exam: Blood pressure 162/86, pulse 77, temperature 97.7 F (36.5 C), temperature source Oral, resp. rate 12, SpO2 100 %. Physical Exam Constitutional: NAD, appears comfortable HEENT: Atraumatic, normocephalic. PERRL, anicteric sclera.  Neck: Supple, trachea midline.  Cardiovascular: RRR, no murmurs, rubs, or gallops.  Pulmonary/Chest: Diffuse wheezing, bibasilar crackles and rhonchi (L>R). No chest wall abnormalities.  Abdominal: Soft, non tender, distended due to habitus. +BS.  Extremities: Warm and well perfused. Distal pulses intact. Trace pitting edema bilateral ankles.  Neurological: A&Ox3, CN II - XII grossly  intact.  Skin: No rashes or erythema  Psychiatric: Normal mood and affect  EKG: Normal sinus rhythm with ST elevation in V2 and V3, unchanged in comparison to prior tracing   CXR:  IMPRESSION: No acute cardiopulmonary findings.  Assessment & Plan by Problem:  Chest pain/SOB: Atypical in presentation however patient with multiple coronary risk factors (obesity, chronic tobacco use, HTN, and HLD) and chest pain relieved with SL nitro. Troponin I 0.03 and EKG with J-point elevation in leads V2 and V3, however unchanged from prior tracing. Diffuse wheezing, crackles, and rhonchi on physical exam. Patient reports symptoms of  URI with nasal congestions and drainage, but denies cough and fevers at home. History of HIV, last CD4 count 570. Reports exposure to infants with cold symptoms. Etiology concerning for ACS vs. PNA vs. Bronchitis.  -- Trend troponins q6hrs to r/o ACS  -- Admit for tele monitoring  -- Received cefepime, bactrim, and vancomycin in ED -- Albuterol nebulizer and ipratropium x 1 in ED   ESRD on HD (M/W/F): BP stable and electrolytes normal. No signs of volume overload at this time. Last HD session was on Friday which patient completed without incident. Due for HD this morning at 5 am. Patient is being evaluated at [facility] for renal transplant.  -- Nephrology consult for HD -- Strict I&Os -- Daily weights  -- Renal diet with fluid restriction  -- Continue renvela TID  HTN: -- Continue amlodipine 10 mg QHS, coreg 25 mg BID, hydralazine 10 mg TID, and doxazosin 8mg  QHS -- Hold home lasix   HIV: -- Continue tenofovir (viread), rilpivirine (edurant), and dolutegravir (tivicay) daily   Depression: -- Continue celexa 20 mg daily    Dispo: Admit patient to Observation with expected length of stay less than 2 midnights.  Signed: Velna Ochs, MD 03/31/2016, 6:29 AM  Pager: [phone]

## 2016-03-31 NOTE — ED Provider Notes (Signed)
[facility] DEPT Provider Note   CSN: [id number] Arrival date & time: 03/31/16  5208     History   Chief Complaint Chief Complaint  Patient presents with  . Shortness of Breath    HPI Darrell Johnson is a 50 y.o. male.  HPI  Pt has PMH of anemia, arthritis, CHF, CKD on dialysis since Feb 2016, gout, HIV, hx of pneumonia, seizure disorder.  Pt last dialyzed on Friday comes to the ER with complaints of SOB and CP. It woke him up out of his sleep with SOB at first and then he developed substernal and tight chest pain. He has been more anxious recently. Denies having swelling or weight gain. He was given SL nitro x 4 and 324 mg aspirin en route by EMS. After receiving this medication he now has a 10/10 headache that is global. He denies having any n/v/d fatigue, confusion, injury, back pain, flank pain.  Past Medical History:  Diagnosis Date  . Anemia   . Arthritis   . CHF (congestive heart failure) ([place])   . Chronic kidney disease    stage 3-4 CKD, followed by Dr. Moshe Cipro  . ESRD (end stage renal disease) ([place])    ESRD due to HTN started dialysis Feb 2016  . Gout   . Gout, unspecified 08/13/2009   Qualifier: Diagnosis of  By: Redmond Pulling  MD, Mateo Flow    . H1N1 influenza    March 2016  . History of gout   . History of syphilis   . HIV (human immunodeficiency virus infection) ([place])   . HIV infection ([place]) 1980's   on ART therapy since, followed by ID clinic, complicated  by neuropathy  . HTN (hypertension)   . Hyperlipidemia    hypertrygliceridemia determined ti be secondary to ART therpay  . Hypertension   . HYPERTENSION 05/08/2006  . 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 Pneumonia   . Rib fractures 01/2009  . Seizure disorder ([place])   . Seizures ([place])    last seizure was >5 years ago, pt has family history of seizures  . Sexually transmitted disease    gonorrhea and trichomonas, penile condylomata - s/p circu,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 that was the reason  for her at all as if she is a  . Syphilis 1997   history of syphilis 1997  . Tobacco abuse     Patient Active Problem List   Diagnosis Date Noted  . Anxiety state 03/11/2016  . Acute pulmonary edema ([place]) 01/28/2016  . HIV (human immunodeficiency virus infection) ([place]) 01/28/2016  . HTN (hypertension) 01/28/2016  . ESRD (end stage renal disease) ([place]) 01/28/2016  . Lumbar radiculopathy 01/28/2016  . Tobacco abuse 01/28/2016  . Acute respiratory failure with hypoxia ([place]) 01/28/2016  . Tinea capitis 11/24/2015  . Seasonal allergies   . Chemical injury of eye 09/05/2015  . Maculopapular rash 08/09/2015  . Healthcare maintenance 05/03/2015  . Headache 03/19/2015  . Hemorrhoid 11/30/2014  . AIDS ([place])   . Leukopenia   . Anemia in chronic kidney disease 08/30/2014  . End stage renal disease ([place]) 12/27/2013  . Drug noncompliance 11/07/2013  . History of syphilis 11/07/2013  . Arthritis 09/09/2013  . Tobacco abuse 09/07/2013  . Chronic pain disorder 04/28/2013  . HYPERTRIGLYCERIDEMIA 11/01/2009  . Gout 08/13/2009  . ERECTILE DYSFUNCTION 06/08/2008  . Human immunodeficiency virus (HIV) disease ([place]) 05/08/2006  . PERIPHERAL NEUROPATHY 05/08/2006  . Essential hypertension 05/08/2006  . SEIZURE DISORDER 05/08/2006    Past Surgical History:  Procedure  Laterality Date  . ABSCESS DRAINAGE    . AV FISTULA PLACEMENT Right 12/02/2013   Procedure: ARTERIOVENOUS (AV) FISTULA CREATION;  Surgeon: Rosetta Posner, MD;  Location: [facility] OR;  Service: Vascular;  Laterality: Right;      Home Medications    Prior to Admission medications   Medication Sig Start Date End Date Taking? Authorizing Provider  carvedilol (COREG) 25 MG tablet TAKE 1 TABLET (25 MG TOTAL) BY MOUTH 2 (TWO) TIMES DAILY WITH A MEAL. 01/15/16  Yes Jule Ser, DO  citalopram (CELEXA) 10 MG tablet Take 1 tablet (10 mg total) by mouth daily. 1 tablet daily for 2 weeks, then 2 tablets daily for 2 weeks and continue at that  dose. Patient taking differently: Take 20 mg by mouth daily.  03/11/16  Yes Jessica Ratliff Hoffman, DO  dolutegravir (TIVICAY) 50 MG tablet Take 1 tablet (50 mg total) by mouth daily. 01/03/16  Yes Carlyle Basques, MD  doxazosin (CARDURA) 8 MG tablet TAKE 1 TABLET (8 MG TOTAL) BY MOUTH AT BEDTIME. 10/09/15  Yes Jule Ser, DO  Febuxostat (ULORIC) 80 MG TABS Take 1 tablet by mouth daily.   Yes Historical Provider, MD  furosemide (LASIX) 40 MG tablet TAKE 1 TABLET (40 MG TOTAL) BY MOUTH DAILY. 01/15/16  Yes Jule Ser, DO  hydrALAZINE (APRESOLINE) 10 MG tablet Take 1 tablet (10 mg total) by mouth 3 (three) times daily. 01/29/16  Yes Alexa Angela Burke, MD  montelukast (SINGULAIR) 10 MG tablet Take 10 mg by mouth at bedtime.   Yes Historical Provider, MD  RENVELA 800 MG tablet Take 2,400 mg by mouth 3 (three) times daily with meals.  11/21/14  Yes Historical Provider, MD  rilpivirine (EDURANT) 25 MG TABS tablet Take 1 tablet (25 mg total) by mouth daily with breakfast. 01/03/16  Yes Carlyle Basques, MD  tenofovir (VIREAD) 300 MG tablet TAKE 1 TABLET BY MOUTH ONCE A WEEK Patient taking differently: Take 300 mg by mouth once a week. Sunday 01/03/16  Yes Carlyle Basques, MD  amLODipine (NORVASC) 5 MG tablet Take 2 tablets (10 mg total) by mouth daily. Patient taking differently: Take 10 mg by mouth at bedtime.  08/09/15 08/08/16  Jule Ser, DO  colchicine 0.6 MG tablet Take 1 capsule by mouth 3 times a week after dialysis. Patient not taking: Reported on 03/31/2016 02/06/16 07/08/16  Jule Ser, DO  HYDROcodone-acetaminophen (NORCO/VICODIN) 5-325 MG tablet Take 1-2 tablets by mouth every 4 (four) hours as needed for severe pain. Patient not taking: Reported on 03/31/2016 01/15/16   Olivia Canter Sam, PA-C  methocarbamol (ROBAXIN) 500 MG tablet Take 1 tablet (500 mg total) by mouth 2 (two) times daily. Patient not taking: Reported on 03/31/2016 01/15/16   Anne Ng, PA-C    Family History Family History    Problem Relation Age of Onset  . Cancer Mother   . Hypertension Mother   . COPD Father   . Hypertension Father   . Diabetes Sister   . Hypertension Sister   . Diabetes Brother   . Hypertension Brother   . Stroke Neg Hx     Social History Social History  Substance Use Topics  . Smoking status: Current Every Day Smoker    Packs/day: 0.40    Years: 20.00    Start date: 01/10/2016  . Smokeless tobacco: Never Used  . Alcohol use No     Allergies   Review of patient's allergies indicates no known allergies.   Review of Systems Review of Systems  Review of Systems All other systems negative except as documented in the HPI. All pertinent positives and negatives as reviewed in the HPI.   Physical Exam Updated Vital Signs BP 162/86   Pulse 77   Temp 97.7 F (36.5 C) (Oral)   Resp 12   SpO2 100%   Physical Exam  Constitutional: He is oriented to person, place, and time. He appears well-developed and well-nourished.  HENT:  Head: Normocephalic and atraumatic.  Eyes: EOM are normal. Pupils are equal, round, and reactive to light.  Neck: Normal range of motion.  Cardiovascular: Normal rate and regular rhythm.   Pulmonary/Chest: Effort normal. No apnea and no tachypnea. No respiratory distress. He has decreased breath sounds. He has rhonchi (diffuse).  Musculoskeletal: Normal range of motion.  Neurological: He is alert and oriented to person, place, and time.  Skin: Skin is warm and dry.     ED Treatments / Results  Labs (all labs ordered are listed, but only abnormal results are displayed) Labs Reviewed  CBC WITH DIFFERENTIAL/PLATELET - Abnormal; Notable for the following:       Result Value   WBC 10.6 (*)    RBC 3.74 (*)    Hemoglobin 10.4 (*)    HCT 33.1 (*)    All other components within normal limits  BASIC METABOLIC PANEL - Abnormal; Notable for the following:    BUN 73 (*)    Creatinine, Ser 11.64 (*)    GFR calc non Af Amer 4 (*)    GFR calc Af Amer 5  (*)    All other components within normal limits  TROPONIN I - Abnormal; Notable for the following:    Troponin I 0.03 (*)    All other components within normal limits  I-STAT TROPOININ, ED    EKG  EKG Interpretation  Date/Time:  Monday March 31 2016 02:29:01 EDT Ventricular Rate:  73 PR Interval:    QRS Duration: 99 QT Interval:  407 QTC Calculation: 449 R Axis:   77 Text Interpretation:  Sinus rhythm Atrial premature complex ST elevation in Anterior leads No significant change since 06 Aug 2002 Confirmed by [facility]  MD-I, IVA ([id number]) on 03/31/2016 2:37:54 AM       Radiology Dg Chest 2 View  Result Date: 03/31/2016 CLINICAL DATA:  Midchest pain and dyspnea tonight EXAM: CHEST  2 VIEW COMPARISON:  09/28/2014 FINDINGS: Chronic right lateral pleural thickening, unchanged. Adjacent pleural parenchymal scarring, unchanged. Left lung is clear. No pleural effusions are evident. Pulmonary vasculature is normal. Hilar and mediastinal contours are unremarkable and unchanged. IMPRESSION: No acute cardiopulmonary findings. Electronically Signed   By: Andreas Newport M.D.   On: 03/31/2016 04:02    Procedures Procedures (including critical care time)  Medications Ordered in ED Medications  vancomycin (VANCOCIN) 2,000 mg in sodium chloride 0.9 % 500 mL IVPB (2,000 mg Intravenous New Bag/Given 03/31/16 0431)  sulfamethoxazole-trimethoprim (BACTRIM DS,SEPTRA DS) 800-160 MG per tablet 1 tablet (1 tablet Oral Given 03/31/16 0353)  albuterol (PROVENTIL) (2.5 MG/3ML) 0.083% nebulizer solution 5 mg (not administered)  ipratropium (ATROVENT) nebulizer solution 0.5 mg (not administered)  ceFEPIme (MAXIPIME) 1 g in dextrose 5 % 50 mL IVPB (0 g Intravenous Stopped 03/31/16 0433)     Initial Impression / Assessment and Plan / ED Course  I have reviewed the triage vital signs and the nursing notes.  Pertinent labs & imaging results that were available during my care of the patient were reviewed by  me and considered in my medical  decision making (see chart for details).  Clinical Course    Patients xray is unremarkable. He is HIV positive has PMH of CHF and is a dialysis patient. Clinically he has pneumonia and has been treated as health care acquired PNA  @ 0500 -  I spoke with the Internal Medicine Residents, they have agreed to come down and see the patient, they will advise on dc vs admit.  @ [phone] -  Resident service says they plan to admit patient for obs. 7am team service will likely come down and see him and place orders.  Final Clinical Impressions(s) / ED Diagnoses   Final diagnoses:  Chest pain, unspecified chest pain type  Shortness of breath    New Prescriptions New Prescriptions   No medications on file      Delos Haring, PA-C 03/31/16 [place], MD 03/31/16 [phone]

## 2016-03-31 NOTE — Consult Note (Signed)
   [facility] CM Inpatient Consult   03/31/2016  Darrell Johnson [birth year] [id number]  Patient screened for potential [facility] Management services. Patient is eligible for [facility] Care Management services under patient's Medicare  plan. Came by to speak with patient but he had  left for HD.  Will follow up at a more appropriate time.  Please place a [facility] Care Management consult or for questions contact:   Natividad Brood, RN BSN [facility] Liaison  [phone] business mobile phone Toll free office [phone]

## 2016-03-31 NOTE — Discharge Summary (Signed)
Name: Darrell Johnson MRN: [id number] DOB: 07-24-1965 50 y.o. PCP: Darrell Ser, DO  Date of Admission: 03/31/2016  2:14 AM Date of Discharge: 04/01/2016 Attending Physician: Darrell Filler, MD  Discharge Diagnosis: 1. Non-Cardiac Chest Pain  2. Post-Infectious Reactive Airway Disease  Active Problems:   Chest pain   Discharge Medications:   Medication List    TAKE these medications   albuterol 108 (90 Base) MCG/ACT inhaler Commonly known as:  PROVENTIL HFA;VENTOLIN HFA Inhale 2 puffs into the lungs every 6 (six) hours as needed for wheezing or shortness of breath.   amLODipine 5 MG tablet Commonly known as:  NORVASC Take 2 tablets (10 mg total) by mouth daily. What changed:  when to take this   carvedilol 25 MG tablet Commonly known as:  COREG TAKE 1 TABLET (25 MG TOTAL) BY MOUTH 2 (TWO) TIMES DAILY WITH A MEAL.   citalopram 10 MG tablet Commonly known as:  CELEXA Take 1 tablet (10 mg total) by mouth daily. 1 tablet daily for 2 weeks, then 2 tablets daily for 2 weeks and continue at that dose. What changed:  how much to take  additional instructions   colchicine 0.6 MG tablet Take 1 capsule by mouth 3 times a week after dialysis.   dolutegravir 50 MG tablet Commonly known as:  TIVICAY Take 1 tablet (50 mg total) by mouth daily.   doxazosin 8 MG tablet Commonly known as:  CARDURA TAKE 1 TABLET (8 MG TOTAL) BY MOUTH AT BEDTIME.   furosemide 40 MG tablet Commonly known as:  LASIX TAKE 1 TABLET (40 MG TOTAL) BY MOUTH DAILY.   hydrALAZINE 10 MG tablet Commonly known as:  APRESOLINE Take 1 tablet (10 mg total) by mouth 3 (three) times daily.   HYDROcodone-acetaminophen 5-325 MG tablet Commonly known as:  NORCO/VICODIN Take 1-2 tablets by mouth every 4 (four) hours as needed for severe pain.   methocarbamol 500 MG tablet Commonly known as:  ROBAXIN Take 1 tablet (500 mg total) by mouth 2 (two) times daily.   montelukast 10 MG  tablet Commonly known as:  SINGULAIR Take 10 mg by mouth at bedtime.   RENVELA 800 MG tablet Generic drug:  sevelamer carbonate Take 2,400 mg by mouth 3 (three) times daily with meals.   rilpivirine 25 MG Tabs tablet Commonly known as:  EDURANT Take 1 tablet (25 mg total) by mouth daily with breakfast.   tenofovir 300 MG tablet Commonly known as:  VIREAD TAKE 1 TABLET BY MOUTH ONCE A WEEK What changed:  how much to take  how to take this  when to take this  additional instructions   ULORIC 80 MG Tabs Generic drug:  Febuxostat Take 1 tablet by mouth daily.       Disposition and follow-up:   Darrell Johnson was discharged from [facility] in Good condition.  At the hospital follow up visit please address:  1.  Dyspnea, concern for COPD.  Outpatient PFTs ordered for 2 weeks post-discharge.  2.  Primary prevention for CAD.  Discuss statin and aspirin.    2.  Labs / imaging needed at time of follow-up: none  3.  Pending labs/ test needing follow-up: outpatient PFTs  Follow-up Appointments: Follow-up Information    Darrell Ser, DO. Schedule an appointment as soon as possible for a visit in 1 week(s).   Specialty:  Internal Medicine Why:  [facility] will call you to make appointment in 1 week Contact information: [address]  [place] [phone]           Hospital Course by problem list: Active Problems:   Chest pain   1. Chest Pain [name] presented with chest pain and shortness of breath that woke him from sleep.  Chest pain was atypical: sharp, left sided, non-exertional, and non-radiating, but was relieved by SL nitro given by EMS.  He has had panic attacks previously, and this event was different.  His EKG was unchanged from prior, with J point elevated in anterior leads, and low troponins with flat trend not concerning for MI in dialysis patient.  His chest pain resolved.  He had a low risk MP stress in 11/2015, and  another stress test was not ordered.  His ASCVD was calculated to be 20.5%, and he would benefit from discussion of primary prevention of CAD as an outpatient.  2. Dyspnea He had cough, rhinorrhea and nasal congestion the week prior to admission, consistent with a viral URI.  He is a current smoker, but has not previously be diagnosed with COPD.  On admission, he was wheezing but moving good air and not in respiratory distress.  He was given nebulizer treatments, and his breathing improved and wheezing resolved.  Before discharge, he was saturating 97% on room at at rest, and 92% with ambulation.  His hemoglobin was at baseline.  He was given an albuterol inhaler and instructed on its use, and will follow-up in [facility] and with outpatient PFTs to assess for COPD once his acute infection and post-infectious symptoms resolve.  He was also advised to quit smoking.  3. ESRD Received dialysis on Monday 9/11 to continue his MWF schedule.  Discharge Vitals:   BP (!) 177/89 (BP Location: Left Arm) Comment: nurse notified  Pulse 84   Temp 98.6 F (37 C) (Oral)   Resp 18   Ht 5\' 9"  (1.753 m)   Wt 229 lb 14.4 oz (104.3 kg) Comment: scale c  SpO2 97%   BMI 33.95 kg/m   Pertinent Labs, Studies, and Procedures:  Cardiac Panel (last 3 results)  Recent Labs  03/31/16 0243 03/31/16 0904  TROPONINI 0.03* 0.03*   CBC Latest Ref Rng & Units 03/31/2016 01/29/2016 01/28/2016  WBC 4.0 - 10.5 K/uL 10.6(H) 10.0 -  Hemoglobin 13.0 - 17.0 g/dL 10.4(L) 10.6(L) 14.6  Hematocrit 39.0 - 52.0 % 33.1(L) 32.8(L) 43.0  Platelets 150 - 400 K/uL 177 179 -      Discharge Instructions: Discharge Instructions    Diet - low sodium heart healthy    Complete by:  As directed   Increase activity slowly    Complete by:  As directed     You were admitted to the hospital for chest pain and shortness of breath.  We did NOT find that you had a heart attack, pneumonia, or any other dangerous cause of chest pain.    Your  shortness of breath was most likely left over from the upper respiratory tract infection, or cold, that you recently had.  However, because you are a smoker, you may have some COPD.  We will have you follow-up with your primary doctor to further look into your breathing, and schedule you for some breathing tests as an outpatient.  Until then, we gave you an albuterol inhaler to use if you start wheezing and having troubles breathing.  You can also take over-the-counter Mucinex for cough if needed.  For your breathing, it is important that you quit smoking.  If your  chest pain comes back or you have difficulty breathing, please come back to the ED.  Signed: Minus Liberty, MD 04/01/2016, 11:30 AM   Pager: [phone]

## 2016-03-31 NOTE — ED Notes (Signed)
Pt c/o a headache that is his 10/10

## 2016-03-31 NOTE — ED Notes (Signed)
The pt reports that he has aheadache from the nitro and he wants something for his headache.  He woke up 2 hours ago with sob followed by chest tightness he reports that he has had in the past  And has had fluid on his lungs

## 2016-03-31 NOTE — Progress Notes (Signed)
1230 report given to [place]

## 2016-03-31 NOTE — Progress Notes (Signed)
Pt with ESRD on MWF HD at [facility] consulted to provide routine dialysis - in OBS bed.  Anticipated d/c today or tomorrow today.  Pt admitted yesterday with CP, SOB. EKG negative. Thought to have post infectious reactive airway.  Coarse BS on the left. Able to lie supine on his side without SOB.  BP 170s - consistent with outpt BP. Plan to get standing wt - goal 2-3 - evaluate during HD treatment.  Plan HD today 4.25 EDW 104 - Usually on 2 K bath - will use 4 K due to am K of 3.7. Qb 400 DFR 600 - heparin 5000 bolus with 3000 mid if needed, calcitriol 1.75 - Micera last given 100 on 8/31 - due on Thursday; IV venofer 50 due Thursday if not d/c. Should be ok for d/c post HD   Amalia Hailey, PA-C [place]  Pt seen, examined and agree w A/P as above.  Kelly Splinter MD [employer] pager [phone]    cell [phone] 04/07/2016, 10:54 AM

## 2016-04-01 DIAGNOSIS — J45998 Other asthma: Secondary | ICD-10-CM

## 2016-04-01 DIAGNOSIS — F17210 Nicotine dependence, cigarettes, uncomplicated: Secondary | ICD-10-CM | POA: Diagnosis not present

## 2016-04-01 DIAGNOSIS — R0789 Other chest pain: Secondary | ICD-10-CM | POA: Diagnosis not present

## 2016-04-01 MED ORDER — HYDRALAZINE HCL 25 MG PO TABS
25.0000 mg | ORAL_TABLET | Freq: Three times a day (TID) | ORAL | Status: DC
  Administered 2016-04-01: 25 mg via ORAL
  Filled 2016-04-01: qty 1

## 2016-04-01 MED ORDER — ALBUTEROL SULFATE HFA 108 (90 BASE) MCG/ACT IN AERS: 2.0000 | INHALATION_SPRAY | Freq: Four times a day (QID) | RESPIRATORY_TRACT | 2 refills | Status: DC | PRN

## 2016-04-01 NOTE — Care Management Note (Signed)
Case Management Note  Patient Details  Name: Darrell Johnson MRN: [id number] Date of Birth: 24-Dec-1965  Subjective/Objective:       CM following for progression and d/c planning.              Action/Plan: 04/01/2016 Met with pt and OBS notification given and explained. No HH or DME needs identified.   Expected Discharge Date:     04/01/2016             Expected Discharge Plan:  Home/Self Care  In-House Referral:  NA  Discharge planning Services  NA  Post Acute Care Choice:  NA Choice offered to:  NA  DME Arranged:   NA DME Agency:   NA  HH Arranged:   NA HH Agency:   NA  Status of Service:  Completed, signed off  If discussed at [place] of Stay Meetings, dates discussed:    Additional Comments:  Adron Bene, RN 04/01/2016, 1:12 PM

## 2016-04-01 NOTE — Progress Notes (Signed)
Patient ambulated in hall. o2 sat remained above 94%  Syrita Dovel, Mervin Kung RN

## 2016-04-01 NOTE — Progress Notes (Signed)
   Subjective: No acute events overnight.  Shortness of breath has improved, and he feels ready to go home.  Discussed use of albuterol rescue inhaler, and his wife has asthma and is familiar with inhaler use.  Objective:  Vital signs in last 24 hours: Vitals:   04/01/16 0050 04/01/16 0435 04/01/16 0644 04/01/16 0810  BP:   (!) 177/89   Pulse:   84   Resp:   18   Temp:  97.9 F (36.6 C) 98.6 F (37 C)   TempSrc:  Oral Oral   SpO2: 99%  100% 97%  Weight:   229 lb 14.4 oz (104.3 kg)   Height:       Physical Exam  Constitutional: He is oriented to person, place, and time. He appears well-developed and well-nourished.  Cardiovascular: Normal rate and regular rhythm.   Pulmonary/Chest:  No wheezing, good air movement, no respiratory distress  Abdominal: Soft. He exhibits no distension. There is no tenderness.  Musculoskeletal:  Trace biLE edema  Neurological: He is alert and oriented to person, place, and time.  Skin: Skin is warm and dry.  Psychiatric: He has a normal mood and affect. His behavior is normal.   Cardiac Panel (last 3 results)  Recent Labs  03/31/16 0243 03/31/16 0904  TROPONINI 0.03* 0.03*    Assessment/Plan:  Active Problems:   Chest pain  #Chest Pain Resolved.  EKG without acute ischemic changes, Troponins flat and low not concerning for MI in dialysis patient.  Low risk MPI stress study in 11/2015.  However, his ASCVD risk is elevated with 20.5% 10 year risk.  Was previously on pravastatin, unclear why discontinued.  #Dyspnea #Post-Infectious Reactive Airways He is afebrile with no leukocytosis and normal CXR.  With his recent URI symptoms, his cough is likely post-infectious in etiology.  With his long smoking history, he likely has some undiagnosed COPD.  Will treat symptomatically and discharge with albuterol inhaler. -Albuterol inhaler -Outpatient PFTs -[facility] follow-up  #ESRD MWF.  Received inpatient dialysis yesterday. -Resume home dialysis  schedule  Dispo: Anticipated discharge in approximately 0 day(s).   Minus Liberty, MD 04/01/2016, 11:35 AM Pager: [phone]

## 2016-04-01 NOTE — Discharge Instructions (Signed)
You were admitted to the hospital for chest pain and shortness of breath.  We did NOT find that you had a heart attack, pneumonia, or any other dangerous cause of chest pain.    Your shortness of breath was most likely left over from the upper respiratory tract infection, or cold, that you recently had.  However, because you are a smoker, you may have some COPD.  We will have you follow-up with your primary doctor to further look into your breathing, and schedule you for some breathing tests as an outpatient.  Until then, we gave you an albuterol inhaler to use if you start wheezing and having troubles breathing.  You can also take over-the-counter Mucinex for cough if needed.  For your breathing, it is important that you quit smoking.  If your chest pain comes back or you have difficulty breathing, please come back to the ED.   Chest Pain Observation It is often hard to give a specific diagnosis for the cause of chest pain. Among other possibilities your symptoms might be caused by inadequate oxygen delivery to your heart (angina). Angina that is not treated or evaluated can lead to a heart attack (myocardial infarction) or death. Blood tests, electrocardiograms, and X-rays may have been done to help determine a possible cause of your chest pain. After evaluation and observation, your health care provider has determined that it is unlikely your pain was caused by an unstable condition that requires hospitalization. However, a full evaluation of your pain may need to be completed, with additional diagnostic testing as directed. It is very important to keep your follow-up appointments. Not keeping your follow-up appointments could result in permanent heart damage, disability, or death. If there is any problem keeping your follow-up appointments, you must call your health care provider. HOME CARE INSTRUCTIONS  Due to the slight chance that your pain could be angina, it is important to follow your health  care provider's treatment plan and also maintain a healthy lifestyle:  Maintain or work toward achieving a healthy weight.  Stay physically active and exercise regularly.  Decrease your salt intake.  Eat a balanced, healthy diet. Talk to a dietitian to learn about heart-healthy foods.  Increase your fiber intake by including whole grains, vegetables, fruits, and nuts in your diet.  Avoid situations that cause stress, anger, or depression.  Take medicines as advised by your health care provider. Report any side effects to your health care provider. Do not stop medicines or adjust the dosages on your own.  Quit smoking. Do not use nicotine patches or gum until you check with your health care provider.  Keep your blood pressure, blood sugar, and cholesterol levels within normal limits.  Limit alcohol intake to no more than 1 drink per day for women who are not pregnant and 2 drinks per day for men.  Do not abuse drugs. SEEK IMMEDIATE MEDICAL CARE IF: You have severe chest pain or pressure which may include symptoms such as:  You feel pain or pressure in your arms, neck, jaw, or back.  You have severe back or abdominal pain, feel sick to your stomach (nauseous), or throw up (vomit).  You are sweating profusely.  You are having a fast or irregular heartbeat.  You feel short of breath while at rest.  You notice increasing shortness of breath during rest, sleep, or with activity.  You have chest pain that does not get better after rest or after taking your usual medicine.  You wake from  sleep with chest pain.  You are unable to sleep because you cannot breathe.  You develop a frequent cough or you are coughing up blood.  You feel dizzy, faint, or experience extreme fatigue.  You develop severe weakness, dizziness, fainting, or chills. Any of these symptoms may represent a serious problem that is an emergency. Do not wait to see if the symptoms will go away. Call your local  emergency services (911 in the [place] Do not drive yourself to the hospital. MAKE SURE YOU:  Understand these instructions.  Will watch your condition.  Will get help right away if you are not doing well or get worse.   This information is not intended to replace advice given to you by your health care provider. Make sure you discuss any questions you have with your health care provider.   Document Released: 08/09/2010 Document Revised: 07/12/2013 Document Reviewed: 01/06/2013 Elsevier Interactive Patient Education [employer].

## 2016-04-01 NOTE — Progress Notes (Signed)
Discharge Note:  Patient alert and oriented X 4 and in no distress.  Pt and his wife given discharge instructions regarding signs and symptoms to report, medications, activity, diet and upcoming appointments.  They both verbalized understanding of all instructions.  Peripheral IV discontinued. Patient declined to be transported out by staff and insisted on walking out with his wife.

## 2016-04-01 NOTE — Care Management Obs Status (Signed)
[facility] NOTIFICATION   Patient Details  Name: Darrell Johnson MRN: [id number] Date of Birth: [birth year]   Medicare Observation Status Notification Given:  Yes    Cheray Pardi, Rory Percy, RN 04/01/2016, 11:06 AM

## 2016-04-02 DIAGNOSIS — N186 End stage renal disease: Secondary | ICD-10-CM | POA: Diagnosis not present

## 2016-04-02 DIAGNOSIS — D631 Anemia in chronic kidney disease: Secondary | ICD-10-CM | POA: Diagnosis not present

## 2016-04-02 DIAGNOSIS — N2581 Secondary hyperparathyroidism of renal origin: Secondary | ICD-10-CM | POA: Diagnosis not present

## 2016-04-03 DIAGNOSIS — T82868D Thrombosis of vascular prosthetic devices, implants and grafts, subsequent encounter: Secondary | ICD-10-CM | POA: Diagnosis not present

## 2016-04-03 DIAGNOSIS — I871 Compression of vein: Secondary | ICD-10-CM | POA: Diagnosis not present

## 2016-04-03 DIAGNOSIS — N186 End stage renal disease: Secondary | ICD-10-CM | POA: Diagnosis not present

## 2016-04-03 DIAGNOSIS — Z992 Dependence on renal dialysis: Secondary | ICD-10-CM | POA: Diagnosis not present

## 2016-04-04 DIAGNOSIS — D631 Anemia in chronic kidney disease: Secondary | ICD-10-CM | POA: Diagnosis not present

## 2016-04-04 DIAGNOSIS — N2581 Secondary hyperparathyroidism of renal origin: Secondary | ICD-10-CM | POA: Diagnosis not present

## 2016-04-04 DIAGNOSIS — N186 End stage renal disease: Secondary | ICD-10-CM | POA: Diagnosis not present

## 2016-04-07 DIAGNOSIS — N186 End stage renal disease: Secondary | ICD-10-CM | POA: Diagnosis not present

## 2016-04-07 DIAGNOSIS — D631 Anemia in chronic kidney disease: Secondary | ICD-10-CM | POA: Diagnosis not present

## 2016-04-07 DIAGNOSIS — N2581 Secondary hyperparathyroidism of renal origin: Secondary | ICD-10-CM | POA: Diagnosis not present

## 2016-04-08 ENCOUNTER — Ambulatory Visit (INDEPENDENT_AMBULATORY_CARE_PROVIDER_SITE_OTHER): Payer: Medicare Other | Admitting: Internal Medicine

## 2016-04-08 ENCOUNTER — Ambulatory Visit: Payer: Medicare Other | Admitting: Pharmacist

## 2016-04-08 DIAGNOSIS — E785 Hyperlipidemia, unspecified: Secondary | ICD-10-CM | POA: Diagnosis not present

## 2016-04-08 DIAGNOSIS — Z23 Encounter for immunization: Secondary | ICD-10-CM | POA: Diagnosis present

## 2016-04-08 DIAGNOSIS — I10 Essential (primary) hypertension: Secondary | ICD-10-CM | POA: Diagnosis not present

## 2016-04-08 DIAGNOSIS — Z87891 Personal history of nicotine dependence: Secondary | ICD-10-CM

## 2016-04-08 DIAGNOSIS — Z0000 Encounter for general adult medical examination without abnormal findings: Secondary | ICD-10-CM

## 2016-04-08 DIAGNOSIS — F411 Generalized anxiety disorder: Secondary | ICD-10-CM

## 2016-04-08 DIAGNOSIS — R0600 Dyspnea, unspecified: Secondary | ICD-10-CM | POA: Diagnosis not present

## 2016-04-08 DIAGNOSIS — Z79899 Other long term (current) drug therapy: Secondary | ICD-10-CM

## 2016-04-08 DIAGNOSIS — Z5189 Encounter for other specified aftercare: Secondary | ICD-10-CM | POA: Diagnosis present

## 2016-04-08 DIAGNOSIS — Z992 Dependence on renal dialysis: Secondary | ICD-10-CM

## 2016-04-08 MED ORDER — ALBUTEROL SULFATE (2.5 MG/3ML) 0.083% IN NEBU: 2.5000 mg | INHALATION_SOLUTION | Freq: Four times a day (QID) | RESPIRATORY_TRACT | 12 refills | Status: DC | PRN

## 2016-04-08 MED ORDER — BUPROPION HCL 75 MG PO TABS: 37.5000 mg | ORAL_TABLET | Freq: Two times a day (BID) | ORAL | 1 refills | Status: DC

## 2016-04-08 MED ORDER — BUPROPION HCL 75 MG PO TABS: 75.0000 mg | ORAL_TABLET | Freq: Every day | ORAL | 2 refills | Status: DC

## 2016-04-08 MED ORDER — PRAVASTATIN SODIUM 40 MG PO TABS: 40.0000 mg | ORAL_TABLET | Freq: Every evening | ORAL | 11 refills | Status: DC

## 2016-04-08 NOTE — Progress Notes (Signed)
ESRD patient switching from SSRI to bupropion. Found primary literature for dosing recommendations in stage 3-5 CKD patients and recommend starting patient on 1/2 tab daily of 75 mg IR bupropion. SR and XL cannot be cut in half.  Nephrology Dialysis Transplantation, Volume 27, Issue 10, 21 April 2011, Pages 3730-8168, [email or web address]/   Terald Sleeper PharmD Candidate, c/o 2019 04/08/2016 11:10 AM

## 2016-04-08 NOTE — Progress Notes (Signed)
Pharmacist physician co-visit  Management of sexual dysfunction associated with citalopram therapy. Patient has been on citalopram 10 mg daily x 1 month. Recommend to taper citalopram and switch to bupropion and provided renal dosing recommendations per Terald Sleeper, PharmD Candidate.  Patient education on plan as stated above. Patient verbalized understanding and was advised to contact me if further medication-related questions arise. Patient was also provided an information handout.

## 2016-04-08 NOTE — Assessment & Plan Note (Signed)
A: Patient admitted with cough, rhinorrhea and nasal congestion the week prior to admission, consistent with a viral URI.  He is a current smoker, but has not previously be diagnosed with COPD.  He is trying to quit smoking altogether.  On admission, he was wheezing but moving good air and not in respiratory distress.  He was given nebulizer treatments, and his breathing improved and wheezing resolved.   He was given an albuterol inhaler and instructed on its use, and will follow-up with outpatient PFTs to assess for COPD.  P: - PFT's planned for next week - advised to continue albuterol as needed.  He also uses a nebulizer machine at times and was given albuterol nebulizer solution. - depending on PFT results, he may benefit from a daily maintenance medication such as Spiriva

## 2016-04-08 NOTE — Assessment & Plan Note (Signed)
BP Readings from Last 3 Encounters:  04/08/16 (!) 183/89  04/01/16 (!) 177/89  03/11/16 (!) 162/85   A: BP elevated today as it has been on multiple outpatient visits.  During last hospitalization, nephrologist recommended monitoring BP while on HD as patient has reported taht BP is usually more controlled during and after HD sessions.  P: - no changes to medications today.  I have called his HD center and asked for them to send records of his vitals during recent dialysis sessions.  I will also message his nephrologist to see if they can assist with monitoring his BP while on dialysis.

## 2016-04-08 NOTE — Assessment & Plan Note (Signed)
Flu shot given today

## 2016-04-08 NOTE — Assessment & Plan Note (Signed)
A: Patient previously on Pravastatin but was discontinued for unclear reasons.  He reports no side effects that he is aware of while on Pravastatin.  He had a low risk stress test in May 2017 with no further evaluation.  No further chest pain, however, his ASCVD risk is 20.5%.  He is agreeable to resuming pravastatin.  P: - Pravastatin 40mg  daily.

## 2016-04-08 NOTE — Patient Instructions (Signed)
Please have the pulmonary function test that are ordered for you as this may determine which inhalers to use in the future.   In the meantime, keeping using Albuterol as needed.  Please taper off Celexa: take 1/2 your dose for 7 days.  Then take 1/2 your dose every other day for 7 days.  Then STOP.  Start taking Wellbutrin SR 75mg  daily to replace your Celexa.  You may start this medication tomorrow.  I have started you back on Pravastatin 40mg  daily.  We gave you the flu shot today.    Please follow up in 2-4 months.

## 2016-04-08 NOTE — Assessment & Plan Note (Signed)
A: Patient was started on Celexa about 1 month ago.  Since then he reports sexual side effects and would like to change medications to avoid this issue.  He otherwise has tolerated the medication well.  P: - will taper off Celexa - start Wellbutrin SR 37.5mg  BID.  This was discussed with our clinic pharmacist to ensure safe dosing given his ESRD.

## 2016-04-08 NOTE — Progress Notes (Signed)
CC: here for hospital follow up for dyspnea  HPI:  [name] is a 50 y.o. man with a past medical history listed below here today for follow up of his dyspnea.   For details of today's visit and the status of his chronic medical issues please refer to the assessment and plan.   Past Medical History:  Diagnosis Date  . Anemia   . Arthritis   . CHF (congestive heart failure) ([place])   . Chronic kidney disease    stage 3-4 CKD, followed by Dr. Moshe Cipro  . ESRD (end stage renal disease) ([place])    ESRD due to HTN started dialysis Feb 2016  . Gout   . Gout, unspecified 08/13/2009   Qualifier: Diagnosis of  By: Redmond Pulling  MD, Mateo Flow    . H1N1 influenza    March 2016  . History of gout   . History of syphilis   . HIV (human immunodeficiency virus infection) ([place])   . HIV infection ([place]) 1980's   on ART therapy since, followed by ID clinic, complicated  by neuropathy  . HTN (hypertension)   . Hyperlipidemia    hypertrygliceridemia determined ti be secondary to ART therpay  . Hypertension   . HYPERTENSION 05/08/2006  . 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 Pneumonia   . Rib fractures 01/2009  . Seizure disorder ([place])   . Seizures ([place])    last seizure was >5 years ago, pt has family history of seizures  . 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 Syphilis 1997   history of syphilis 1997  . Tobacco abuse     Review of Systems:   Please see pertinent ROS reviewed in HPI and problem based charting.   Physical Exam:  Vitals:   04/08/16 0958  BP: (!) 183/89  Pulse: 73  Temp: 97.9 F (36.6 C)  TempSrc: Oral  SpO2: 100%  Weight: 232 lb 3.2 oz (105.3 kg)  Height: 5\' 9"  (1.753 m)   Physical Exam  Constitutional: He is oriented to person, place, and time and well-developed, well-nourished, and in no distress.  HENT:  Head:  Normocephalic and atraumatic.  Musculoskeletal: Normal range of motion.  Neurological: He is alert and oriented to person, place, and time.  Skin: Skin is warm and dry.  Psychiatric: Mood and affect normal.     Assessment & Plan:   See Encounters Tab for problem based charting.  Patient discussed with Dr. Daryll Drown.  Essential hypertension BP Readings from Last 3 Encounters:  04/08/16 (!) 183/89  04/01/16 (!) 177/89  03/11/16 (!) 162/85   A: BP elevated today as it has been on multiple outpatient visits.  During last hospitalization, nephrologist recommended monitoring BP while on HD as patient has reported taht BP is usually more controlled during and after HD sessions.  P: - no changes to medications today.  I have called his HD center and asked for them to send records of his vitals during recent dialysis sessions.  I will also message his nephrologist to see if they can assist with monitoring his BP while on dialysis.  Anxiety state A: Patient was started on Celexa about 1 month ago.  Since then he reports sexual side effects and would like to change medications to avoid this issue.  He otherwise has tolerated the medication well.  P: - will taper off  Celexa - start Wellbutrin SR 37.5mg  BID.  This was discussed with our clinic pharmacist to ensure safe dosing given his ESRD.  Hyperlipidemia A: Patient previously on Pravastatin but was discontinued for unclear reasons.  He reports no side effects that he is aware of while on Pravastatin.  He had a low risk stress test in May 2017 with no further evaluation.  No further chest pain, however, his ASCVD risk is 20.5%.  He is agreeable to resuming pravastatin.  P: - Pravastatin 40mg  daily.  Dyspnea A: Patient admitted with cough, rhinorrhea and nasal congestion the week prior to admission, consistent with a viral URI.  He is a current smoker, but has not previously be diagnosed with COPD.  He is trying to quit smoking  altogether.  On admission, he was wheezing but moving good air and not in respiratory distress.  He was given nebulizer treatments, and his breathing improved and wheezing resolved.   He was given an albuterol inhaler and instructed on its use, and will follow-up with outpatient PFTs to assess for COPD.  P: - PFT's planned for next week - advised to continue albuterol as needed.  He also uses a nebulizer machine at times and was given albuterol nebulizer solution. - depending on PFT results, he may benefit from a daily maintenance medication such as [place] maintenance Flu shot given today.

## 2016-04-09 DIAGNOSIS — D631 Anemia in chronic kidney disease: Secondary | ICD-10-CM | POA: Diagnosis not present

## 2016-04-09 DIAGNOSIS — N2581 Secondary hyperparathyroidism of renal origin: Secondary | ICD-10-CM | POA: Diagnosis not present

## 2016-04-09 DIAGNOSIS — N186 End stage renal disease: Secondary | ICD-10-CM | POA: Diagnosis not present

## 2016-04-11 DIAGNOSIS — N2581 Secondary hyperparathyroidism of renal origin: Secondary | ICD-10-CM | POA: Diagnosis not present

## 2016-04-11 DIAGNOSIS — N186 End stage renal disease: Secondary | ICD-10-CM | POA: Diagnosis not present

## 2016-04-11 DIAGNOSIS — D631 Anemia in chronic kidney disease: Secondary | ICD-10-CM | POA: Diagnosis not present

## 2016-04-14 DIAGNOSIS — N2581 Secondary hyperparathyroidism of renal origin: Secondary | ICD-10-CM | POA: Diagnosis not present

## 2016-04-14 DIAGNOSIS — N186 End stage renal disease: Secondary | ICD-10-CM | POA: Diagnosis not present

## 2016-04-14 DIAGNOSIS — D631 Anemia in chronic kidney disease: Secondary | ICD-10-CM | POA: Diagnosis not present

## 2016-04-15 NOTE — Progress Notes (Signed)
Internal Medicine Clinic Attending  Case discussed with Dr. Wallace soon after the resident saw the patient.  We reviewed the resident's history and exam and pertinent patient test results.  I agree with the assessment, diagnosis, and plan of care documented in the resident's note. 

## 2016-04-16 DIAGNOSIS — N2581 Secondary hyperparathyroidism of renal origin: Secondary | ICD-10-CM | POA: Diagnosis not present

## 2016-04-16 DIAGNOSIS — D631 Anemia in chronic kidney disease: Secondary | ICD-10-CM | POA: Diagnosis not present

## 2016-04-16 DIAGNOSIS — N186 End stage renal disease: Secondary | ICD-10-CM | POA: Diagnosis not present

## 2016-04-17 ENCOUNTER — Encounter: Payer: Self-pay | Admitting: Internal Medicine

## 2016-04-18 DIAGNOSIS — N186 End stage renal disease: Secondary | ICD-10-CM | POA: Diagnosis not present

## 2016-04-18 DIAGNOSIS — D631 Anemia in chronic kidney disease: Secondary | ICD-10-CM | POA: Diagnosis not present

## 2016-04-18 DIAGNOSIS — N2581 Secondary hyperparathyroidism of renal origin: Secondary | ICD-10-CM | POA: Diagnosis not present

## 2016-04-19 DIAGNOSIS — N186 End stage renal disease: Secondary | ICD-10-CM | POA: Diagnosis not present

## 2016-04-19 DIAGNOSIS — Z992 Dependence on renal dialysis: Secondary | ICD-10-CM | POA: Diagnosis not present

## 2016-04-19 DIAGNOSIS — I120 Hypertensive chronic kidney disease with stage 5 chronic kidney disease or end stage renal disease: Secondary | ICD-10-CM | POA: Diagnosis not present

## 2016-04-21 ENCOUNTER — Other Ambulatory Visit: Payer: Self-pay | Admitting: Internal Medicine

## 2016-04-21 DIAGNOSIS — N2581 Secondary hyperparathyroidism of renal origin: Secondary | ICD-10-CM | POA: Diagnosis not present

## 2016-04-21 DIAGNOSIS — N186 End stage renal disease: Secondary | ICD-10-CM | POA: Diagnosis not present

## 2016-04-21 DIAGNOSIS — D631 Anemia in chronic kidney disease: Secondary | ICD-10-CM | POA: Diagnosis not present

## 2016-04-23 DIAGNOSIS — N2581 Secondary hyperparathyroidism of renal origin: Secondary | ICD-10-CM | POA: Diagnosis not present

## 2016-04-23 DIAGNOSIS — N186 End stage renal disease: Secondary | ICD-10-CM | POA: Diagnosis not present

## 2016-04-23 DIAGNOSIS — D631 Anemia in chronic kidney disease: Secondary | ICD-10-CM | POA: Diagnosis not present

## 2016-04-25 ENCOUNTER — Telehealth: Payer: Self-pay | Admitting: *Deleted

## 2016-04-25 DIAGNOSIS — N186 End stage renal disease: Secondary | ICD-10-CM | POA: Diagnosis not present

## 2016-04-25 DIAGNOSIS — N2581 Secondary hyperparathyroidism of renal origin: Secondary | ICD-10-CM | POA: Diagnosis not present

## 2016-04-25 DIAGNOSIS — D631 Anemia in chronic kidney disease: Secondary | ICD-10-CM | POA: Diagnosis not present

## 2016-04-25 NOTE — Telephone Encounter (Addendum)
Prior Authorization was sent to CoverMyMeds for Albuterol Inhaler. Review to be completed  in 3 days.  Alyas Creary, RN 04/25/2016 11:39 AM  Prior Authorization received for Albuterol Sulfate 01-26-2016 thru 04/25/2019.  Sander Nephew, RN 04/28/2016 1:39 PM.

## 2016-04-28 DIAGNOSIS — D631 Anemia in chronic kidney disease: Secondary | ICD-10-CM | POA: Diagnosis not present

## 2016-04-28 DIAGNOSIS — N186 End stage renal disease: Secondary | ICD-10-CM | POA: Diagnosis not present

## 2016-04-28 DIAGNOSIS — N2581 Secondary hyperparathyroidism of renal origin: Secondary | ICD-10-CM | POA: Diagnosis not present

## 2016-04-30 DIAGNOSIS — D631 Anemia in chronic kidney disease: Secondary | ICD-10-CM | POA: Diagnosis not present

## 2016-04-30 DIAGNOSIS — N2581 Secondary hyperparathyroidism of renal origin: Secondary | ICD-10-CM | POA: Diagnosis not present

## 2016-04-30 DIAGNOSIS — N186 End stage renal disease: Secondary | ICD-10-CM | POA: Diagnosis not present

## 2016-05-02 DIAGNOSIS — N186 End stage renal disease: Secondary | ICD-10-CM | POA: Diagnosis not present

## 2016-05-02 DIAGNOSIS — N2581 Secondary hyperparathyroidism of renal origin: Secondary | ICD-10-CM | POA: Diagnosis not present

## 2016-05-02 DIAGNOSIS — D631 Anemia in chronic kidney disease: Secondary | ICD-10-CM | POA: Diagnosis not present

## 2016-05-05 ENCOUNTER — Other Ambulatory Visit: Payer: Self-pay | Admitting: *Deleted

## 2016-05-05 DIAGNOSIS — N2581 Secondary hyperparathyroidism of renal origin: Secondary | ICD-10-CM | POA: Diagnosis not present

## 2016-05-05 DIAGNOSIS — D631 Anemia in chronic kidney disease: Secondary | ICD-10-CM | POA: Diagnosis not present

## 2016-05-05 DIAGNOSIS — N186 End stage renal disease: Secondary | ICD-10-CM | POA: Diagnosis not present

## 2016-05-07 ENCOUNTER — Other Ambulatory Visit: Payer: Self-pay | Admitting: Internal Medicine

## 2016-05-07 DIAGNOSIS — N2581 Secondary hyperparathyroidism of renal origin: Secondary | ICD-10-CM | POA: Diagnosis not present

## 2016-05-07 DIAGNOSIS — D631 Anemia in chronic kidney disease: Secondary | ICD-10-CM | POA: Diagnosis not present

## 2016-05-07 DIAGNOSIS — N186 End stage renal disease: Secondary | ICD-10-CM | POA: Diagnosis not present

## 2016-05-07 NOTE — Telephone Encounter (Signed)
We tapered pt off this medication at last visit and started him on Welllbutrin.

## 2016-05-07 NOTE — Telephone Encounter (Signed)
[facility] informed.

## 2016-05-09 DIAGNOSIS — N186 End stage renal disease: Secondary | ICD-10-CM | POA: Diagnosis not present

## 2016-05-09 DIAGNOSIS — N2581 Secondary hyperparathyroidism of renal origin: Secondary | ICD-10-CM | POA: Diagnosis not present

## 2016-05-09 DIAGNOSIS — D631 Anemia in chronic kidney disease: Secondary | ICD-10-CM | POA: Diagnosis not present

## 2016-05-12 DIAGNOSIS — N186 End stage renal disease: Secondary | ICD-10-CM | POA: Diagnosis not present

## 2016-05-12 DIAGNOSIS — N2581 Secondary hyperparathyroidism of renal origin: Secondary | ICD-10-CM | POA: Diagnosis not present

## 2016-05-12 DIAGNOSIS — D631 Anemia in chronic kidney disease: Secondary | ICD-10-CM | POA: Diagnosis not present

## 2016-05-14 DIAGNOSIS — N2581 Secondary hyperparathyroidism of renal origin: Secondary | ICD-10-CM | POA: Diagnosis not present

## 2016-05-14 DIAGNOSIS — D631 Anemia in chronic kidney disease: Secondary | ICD-10-CM | POA: Diagnosis not present

## 2016-05-14 DIAGNOSIS — Z21 Asymptomatic human immunodeficiency virus [HIV] infection status: Secondary | ICD-10-CM | POA: Diagnosis not present

## 2016-05-14 DIAGNOSIS — M1009 Idiopathic gout, multiple sites: Secondary | ICD-10-CM | POA: Diagnosis not present

## 2016-05-14 DIAGNOSIS — M2550 Pain in unspecified joint: Secondary | ICD-10-CM | POA: Diagnosis not present

## 2016-05-14 DIAGNOSIS — L608 Other nail disorders: Secondary | ICD-10-CM | POA: Diagnosis not present

## 2016-05-14 DIAGNOSIS — N186 End stage renal disease: Secondary | ICD-10-CM | POA: Diagnosis not present

## 2016-05-14 DIAGNOSIS — M150 Primary generalized (osteo)arthritis: Secondary | ICD-10-CM | POA: Diagnosis not present

## 2016-05-14 DIAGNOSIS — M533 Sacrococcygeal disorders, not elsewhere classified: Secondary | ICD-10-CM | POA: Diagnosis not present

## 2016-05-14 DIAGNOSIS — M25551 Pain in right hip: Secondary | ICD-10-CM | POA: Diagnosis not present

## 2016-05-16 DIAGNOSIS — N2581 Secondary hyperparathyroidism of renal origin: Secondary | ICD-10-CM | POA: Diagnosis not present

## 2016-05-16 DIAGNOSIS — N186 End stage renal disease: Secondary | ICD-10-CM | POA: Diagnosis not present

## 2016-05-16 DIAGNOSIS — D631 Anemia in chronic kidney disease: Secondary | ICD-10-CM | POA: Diagnosis not present

## 2016-05-19 DIAGNOSIS — D631 Anemia in chronic kidney disease: Secondary | ICD-10-CM | POA: Diagnosis not present

## 2016-05-19 DIAGNOSIS — N186 End stage renal disease: Secondary | ICD-10-CM | POA: Diagnosis not present

## 2016-05-19 DIAGNOSIS — N2581 Secondary hyperparathyroidism of renal origin: Secondary | ICD-10-CM | POA: Diagnosis not present

## 2016-05-20 DIAGNOSIS — Z992 Dependence on renal dialysis: Secondary | ICD-10-CM | POA: Diagnosis not present

## 2016-05-20 DIAGNOSIS — I120 Hypertensive chronic kidney disease with stage 5 chronic kidney disease or end stage renal disease: Secondary | ICD-10-CM | POA: Diagnosis not present

## 2016-05-20 DIAGNOSIS — N186 End stage renal disease: Secondary | ICD-10-CM | POA: Diagnosis not present

## 2016-05-21 DIAGNOSIS — N2581 Secondary hyperparathyroidism of renal origin: Secondary | ICD-10-CM | POA: Diagnosis not present

## 2016-05-21 DIAGNOSIS — D509 Iron deficiency anemia, unspecified: Secondary | ICD-10-CM | POA: Diagnosis not present

## 2016-05-21 DIAGNOSIS — N186 End stage renal disease: Secondary | ICD-10-CM | POA: Diagnosis not present

## 2016-05-21 DIAGNOSIS — D631 Anemia in chronic kidney disease: Secondary | ICD-10-CM | POA: Diagnosis not present

## 2016-05-22 ENCOUNTER — Telehealth: Payer: Self-pay | Admitting: *Deleted

## 2016-05-22 NOTE — Telephone Encounter (Signed)
Hello,  Citalopram was discontinued at last office visit due to reported sexual dysfunction side effect and we started buproprion.  Thanks, Mitzi Hansen

## 2016-05-22 NOTE — Telephone Encounter (Signed)
[facility] from [facility] - requesting refill on Citalopram take one tab daily. Not on current med list.

## 2016-05-23 DIAGNOSIS — D509 Iron deficiency anemia, unspecified: Secondary | ICD-10-CM | POA: Diagnosis not present

## 2016-05-23 DIAGNOSIS — D631 Anemia in chronic kidney disease: Secondary | ICD-10-CM | POA: Diagnosis not present

## 2016-05-23 DIAGNOSIS — N2581 Secondary hyperparathyroidism of renal origin: Secondary | ICD-10-CM | POA: Diagnosis not present

## 2016-05-23 DIAGNOSIS — N186 End stage renal disease: Secondary | ICD-10-CM | POA: Diagnosis not present

## 2016-05-23 NOTE — Telephone Encounter (Signed)
[facility] informed.

## 2016-05-26 ENCOUNTER — Other Ambulatory Visit: Payer: Self-pay | Admitting: Internal Medicine

## 2016-05-26 DIAGNOSIS — D509 Iron deficiency anemia, unspecified: Secondary | ICD-10-CM | POA: Diagnosis not present

## 2016-05-26 DIAGNOSIS — N186 End stage renal disease: Secondary | ICD-10-CM | POA: Diagnosis not present

## 2016-05-26 DIAGNOSIS — N2581 Secondary hyperparathyroidism of renal origin: Secondary | ICD-10-CM | POA: Diagnosis not present

## 2016-05-26 DIAGNOSIS — D631 Anemia in chronic kidney disease: Secondary | ICD-10-CM | POA: Diagnosis not present

## 2016-05-26 MED ORDER — COLCHICINE 0.6 MG PO TABS: ORAL_TABLET | ORAL | 2 refills | Status: DC

## 2016-05-28 DIAGNOSIS — D631 Anemia in chronic kidney disease: Secondary | ICD-10-CM | POA: Diagnosis not present

## 2016-05-28 DIAGNOSIS — N2581 Secondary hyperparathyroidism of renal origin: Secondary | ICD-10-CM | POA: Diagnosis not present

## 2016-05-28 DIAGNOSIS — D509 Iron deficiency anemia, unspecified: Secondary | ICD-10-CM | POA: Diagnosis not present

## 2016-05-28 DIAGNOSIS — N186 End stage renal disease: Secondary | ICD-10-CM | POA: Diagnosis not present

## 2016-05-30 DIAGNOSIS — N186 End stage renal disease: Secondary | ICD-10-CM | POA: Diagnosis not present

## 2016-05-30 DIAGNOSIS — D631 Anemia in chronic kidney disease: Secondary | ICD-10-CM | POA: Diagnosis not present

## 2016-05-30 DIAGNOSIS — N2581 Secondary hyperparathyroidism of renal origin: Secondary | ICD-10-CM | POA: Diagnosis not present

## 2016-05-30 DIAGNOSIS — D509 Iron deficiency anemia, unspecified: Secondary | ICD-10-CM | POA: Diagnosis not present

## 2016-06-02 DIAGNOSIS — D509 Iron deficiency anemia, unspecified: Secondary | ICD-10-CM | POA: Diagnosis not present

## 2016-06-02 DIAGNOSIS — D631 Anemia in chronic kidney disease: Secondary | ICD-10-CM | POA: Diagnosis not present

## 2016-06-02 DIAGNOSIS — N186 End stage renal disease: Secondary | ICD-10-CM | POA: Diagnosis not present

## 2016-06-02 DIAGNOSIS — N2581 Secondary hyperparathyroidism of renal origin: Secondary | ICD-10-CM | POA: Diagnosis not present

## 2016-06-03 ENCOUNTER — Ambulatory Visit (INDEPENDENT_AMBULATORY_CARE_PROVIDER_SITE_OTHER): Payer: Medicare Other | Admitting: Internal Medicine

## 2016-06-03 DIAGNOSIS — R05 Cough: Secondary | ICD-10-CM | POA: Diagnosis not present

## 2016-06-03 DIAGNOSIS — R0981 Nasal congestion: Secondary | ICD-10-CM | POA: Diagnosis not present

## 2016-06-03 DIAGNOSIS — J019 Acute sinusitis, unspecified: Secondary | ICD-10-CM

## 2016-06-03 DIAGNOSIS — Z8701 Personal history of pneumonia (recurrent): Secondary | ICD-10-CM

## 2016-06-03 DIAGNOSIS — Z87891 Personal history of nicotine dependence: Secondary | ICD-10-CM | POA: Diagnosis not present

## 2016-06-03 MED ORDER — GUAIFENESIN 100 MG/5ML PO SOLN: 5.0000 mL | ORAL | 0 refills | Status: DC | PRN

## 2016-06-03 MED ORDER — GUAIFENESIN ER 600 MG PO TB12: 600.0000 mg | ORAL_TABLET | Freq: Two times a day (BID) | ORAL | 2 refills | Status: DC

## 2016-06-03 NOTE — Assessment & Plan Note (Signed)
our days ago he started having productive cough, nasal congestion, and itchy throat. These symptoms are worsened at night and are similar to the symptoms that he experiences when he gets a cold about once per year. All of his family members are sick with colds right now. He has tried mucinex and robitussin, these kept his symptoms from worsening.  He has had pneumonia in the past but feels that these symptoms are nothing like what he experienced at that time. He has no sore throat, CENTOR =1. On exam he has sinus tenderness, no pharyngeal exudate, and chest sounds clear to auscultation. These symptoms may be related to viral or bacterial sinusitis. Given the duration of symptoms severity and the absence of fever there is not an indication for antibiotics at this time, will continue to monitor.   Continue conservative management  Refilled mucinex  Told to call us back if he develops fever or if symptoms continue or worsen in 1 week

## 2016-06-03 NOTE — Progress Notes (Signed)
   CC: cough   HPI: Mr.Lennyn D Manfredi is a 50 y.o. with past medical history as outlined below who presents to acute care clinic for follow up of acute symptoms of cough and congestion. Four days ago he started having productive cough, nasal congestion, and itchy throat. These symptoms are worsened at night and are similar to the symptoms that he experiences when he gets a cold about once per year. All of his family members are sick with colds right now. He has tried mucinex and robitussin, these kept his symptoms from worsening. He has had pneumonia in the past but feels that these symptoms are nothing like what he experienced at that time.   Please see problem list for status of the pt's chronic medical problems.  Past Medical History:  Diagnosis Date  . Anemia   . Arthritis   . CHF (congestive heart failure) ([place])   . Chronic kidney disease    stage 3-4 CKD, followed by Dr. Moshe Cipro  . ESRD (end stage renal disease) ([place])    ESRD due to HTN started dialysis Feb 2016  . Gout   . Gout, unspecified 08/13/2009   Qualifier: Diagnosis of  By: Redmond Pulling  MD, Mateo Flow    . H1N1 influenza    March 2016  . History of gout   . History of syphilis   . HIV (human immunodeficiency virus infection) ([place])   . HIV infection ([place]) 1980's   on ART therapy since, followed by ID clinic, complicated  by neuropathy  . HTN (hypertension)   . Hyperlipidemia    hypertrygliceridemia determined ti be secondary to ART therpay  . Hypertension   . HYPERTENSION 05/08/2006  . 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 Pneumonia   . Rib fractures 01/2009  . Seizure disorder ([place])   . Seizures ([place])    last seizure was >5 years ago, pt has family history of seizures  . 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 Syphilis 1997   history of syphilis 1997  . Tobacco abuse      Review of Systems:  Review of Systems  Constitutional: Negative for fever.  HENT: Positive for congestion and sinus pain. Negative for ear pain and sore throat.   Respiratory: Positive for cough and shortness of breath.   Cardiovascular: Negative for chest pain.    Physical Exam:  There were no vitals filed for this visit. Physical Exam  HENT:  Right Ear: Tympanic membrane, external ear and ear canal normal.  Left Ear: Tympanic membrane, external ear and ear canal normal.  Mouth/Throat: Oropharynx is clear and moist. No oropharyngeal exudate.  Frontal and maxillary sinus tenderness. Anterior auricular tenderness.   Cardiovascular: Normal rate and regular rhythm.   No murmur heard. Pulmonary/Chest: He has no wheezes. He has no rales.  Lymphadenopathy:    He has no cervical adenopathy.    Assessment & Plan:   See Encounters Tab for problem based charting.   Patient seen with Dr. Daryll Drown

## 2016-06-03 NOTE — Patient Instructions (Addendum)
It was a pleasure seeing you today Darrell Johnson,   For your congestion - we have sent in a prescription for robitussin and mucinex to your pharmacy, please pick up whichever one is more affordable.  Please call us if your symptoms worsen or dont improve within the next week.   Please schedule a follow up appointment with your primary care doctor in the next 3 months    Sinusitis, Adult Sinusitis is soreness and inflammation of your sinuses. Sinuses are hollow spaces in the bones around your face. They are located:  Around your eyes.  In the middle of your forehead.  Behind your nose.  In your cheekbones. Your sinuses and nasal passages are lined with a stringy fluid (mucus). Mucus normally drains out of your sinuses. When your nasal tissues get inflamed or swollen, the mucus can get trapped or blocked so air cannot flow through your sinuses. This lets bacteria, viruses, and funguses grow, and that leads to infection. Follow these instructions at home: Medicines  Take, use, or apply over-the-counter and prescription medicines only as told by your doctor. These may include nasal sprays.  If you were prescribed an antibiotic medicine, take it as told by your doctor. Do not stop taking the antibiotic even if you start to feel better. Hydrate and Humidify  Drink enough water to keep your pee (urine) clear or pale yellow.  Use a cool mist humidifier to keep the humidity level in your home above 50%.  Breathe in steam for 10-15 minutes, 3-4 times a day or as told by your doctor. You can do this in the bathroom while a hot shower is running.  Try not to spend time in cool or dry air. Rest  Rest as much as possible.  Sleep with your head raised (elevated).  Make sure to get enough sleep each night. General instructions  Put a warm, moist washcloth on your face 3-4 times a day or as told by your doctor. This will help with discomfort.  Wash your hands often with soap and water. If  there is no soap and water, use hand sanitizer.  Do not smoke. Avoid being around people who are smoking (secondhand smoke).  Keep all follow-up visits as told by your doctor. This is important. Contact a doctor if:  You have a fever.  Your symptoms get worse.  Your symptoms do not get better within 10 days. Get help right away if:  You have a very bad headache.  You cannot stop throwing up (vomiting).  You have pain or swelling around your face or eyes.  You have trouble seeing.  You feel confused.  Your neck is stiff.  You have trouble breathing. This information is not intended to replace advice given to you by your health care provider. Make sure you discuss any questions you have with your health care provider. Document Released: 12/24/2007 Document Revised: 03/02/2016 Document Reviewed: 05/02/2015 Elsevier Interactive Patient Education  2017 [employer].

## 2016-06-04 DIAGNOSIS — N2581 Secondary hyperparathyroidism of renal origin: Secondary | ICD-10-CM | POA: Diagnosis not present

## 2016-06-04 DIAGNOSIS — N186 End stage renal disease: Secondary | ICD-10-CM | POA: Diagnosis not present

## 2016-06-04 DIAGNOSIS — D509 Iron deficiency anemia, unspecified: Secondary | ICD-10-CM | POA: Diagnosis not present

## 2016-06-04 DIAGNOSIS — D631 Anemia in chronic kidney disease: Secondary | ICD-10-CM | POA: Diagnosis not present

## 2016-06-06 DIAGNOSIS — D509 Iron deficiency anemia, unspecified: Secondary | ICD-10-CM | POA: Diagnosis not present

## 2016-06-06 DIAGNOSIS — N2581 Secondary hyperparathyroidism of renal origin: Secondary | ICD-10-CM | POA: Diagnosis not present

## 2016-06-06 DIAGNOSIS — N186 End stage renal disease: Secondary | ICD-10-CM | POA: Diagnosis not present

## 2016-06-06 DIAGNOSIS — D631 Anemia in chronic kidney disease: Secondary | ICD-10-CM | POA: Diagnosis not present

## 2016-06-06 NOTE — Progress Notes (Signed)
Internal Medicine Clinic Attending  I saw and evaluated the patient.  I personally confirmed the key portions of the history and exam documented by Dr. Blum and I reviewed pertinent patient test results.  The assessment, diagnosis, and plan were formulated together and I agree with the documentation in the resident's note. 

## 2016-06-08 DIAGNOSIS — N2581 Secondary hyperparathyroidism of renal origin: Secondary | ICD-10-CM | POA: Diagnosis not present

## 2016-06-08 DIAGNOSIS — D631 Anemia in chronic kidney disease: Secondary | ICD-10-CM | POA: Diagnosis not present

## 2016-06-08 DIAGNOSIS — D509 Iron deficiency anemia, unspecified: Secondary | ICD-10-CM | POA: Diagnosis not present

## 2016-06-08 DIAGNOSIS — N186 End stage renal disease: Secondary | ICD-10-CM | POA: Diagnosis not present

## 2016-06-10 ENCOUNTER — Encounter (HOSPITAL_COMMUNITY): Payer: Self-pay

## 2016-06-10 ENCOUNTER — Emergency Department (HOSPITAL_COMMUNITY): Payer: No Typology Code available for payment source

## 2016-06-10 ENCOUNTER — Emergency Department (HOSPITAL_COMMUNITY)
Admission: EM | Admit: 2016-06-10 | Discharge: 2016-06-10 | Disposition: A | Discharge status: Home / Self Care | Payer: No Typology Code available for payment source | Attending: Emergency Medicine | Admitting: Emergency Medicine

## 2016-06-10 DIAGNOSIS — T148XXA Other injury of unspecified body region, initial encounter: Secondary | ICD-10-CM | POA: Diagnosis not present

## 2016-06-10 DIAGNOSIS — S161XXA Strain of muscle, fascia and tendon at neck level, initial encounter: Secondary | ICD-10-CM | POA: Diagnosis not present

## 2016-06-10 DIAGNOSIS — Y999 Unspecified external cause status: Secondary | ICD-10-CM | POA: Insufficient documentation

## 2016-06-10 DIAGNOSIS — S7001XA Contusion of right hip, initial encounter: Secondary | ICD-10-CM | POA: Diagnosis not present

## 2016-06-10 DIAGNOSIS — I132 Hypertensive heart and chronic kidney disease with heart failure and with stage 5 chronic kidney disease, or end stage renal disease: Secondary | ICD-10-CM | POA: Diagnosis not present

## 2016-06-10 DIAGNOSIS — S79911A Unspecified injury of right hip, initial encounter: Secondary | ICD-10-CM | POA: Diagnosis not present

## 2016-06-10 DIAGNOSIS — I509 Heart failure, unspecified: Secondary | ICD-10-CM | POA: Insufficient documentation

## 2016-06-10 DIAGNOSIS — Z992 Dependence on renal dialysis: Secondary | ICD-10-CM | POA: Insufficient documentation

## 2016-06-10 DIAGNOSIS — S199XXA Unspecified injury of neck, initial encounter: Secondary | ICD-10-CM | POA: Diagnosis present

## 2016-06-10 DIAGNOSIS — Z87891 Personal history of nicotine dependence: Secondary | ICD-10-CM | POA: Diagnosis not present

## 2016-06-10 DIAGNOSIS — Y92410 Unspecified street and highway as the place of occurrence of the external cause: Secondary | ICD-10-CM | POA: Diagnosis not present

## 2016-06-10 DIAGNOSIS — R600 Localized edema: Secondary | ICD-10-CM | POA: Insufficient documentation

## 2016-06-10 DIAGNOSIS — Y939 Activity, unspecified: Secondary | ICD-10-CM | POA: Diagnosis not present

## 2016-06-10 DIAGNOSIS — N186 End stage renal disease: Secondary | ICD-10-CM | POA: Insufficient documentation

## 2016-06-10 DIAGNOSIS — M546 Pain in thoracic spine: Secondary | ICD-10-CM | POA: Diagnosis not present

## 2016-06-10 DIAGNOSIS — M25511 Pain in right shoulder: Secondary | ICD-10-CM | POA: Diagnosis not present

## 2016-06-10 MED ORDER — HYDROCODONE-ACETAMINOPHEN 5-325 MG PO TABS: 1.0000 | ORAL_TABLET | ORAL | 0 refills | Status: DC | PRN

## 2016-06-10 MED ORDER — HYDROCODONE-ACETAMINOPHEN 5-325 MG PO TABS
1.0000 | ORAL_TABLET | Freq: Once | ORAL | Status: AC
  Administered 2016-06-10: 1 via ORAL
  Filled 2016-06-10: qty 1

## 2016-06-10 MED ORDER — ALBUTEROL SULFATE HFA 108 (90 BASE) MCG/ACT IN AERS: 1.0000 | INHALATION_SPRAY | Freq: Four times a day (QID) | RESPIRATORY_TRACT | 0 refills | Status: DC | PRN

## 2016-06-10 MED ORDER — CYCLOBENZAPRINE HCL 10 MG PO TABS: 10.0000 mg | ORAL_TABLET | Freq: Two times a day (BID) | ORAL | 0 refills | Status: DC | PRN
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
## 2016-06-10 NOTE — ED Notes (Signed)
EDP at bedside  
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
## 2016-06-11 DIAGNOSIS — N186 End stage renal disease: Secondary | ICD-10-CM | POA: Diagnosis not present

## 2016-06-11 DIAGNOSIS — D509 Iron deficiency anemia, unspecified: Secondary | ICD-10-CM | POA: Diagnosis not present

## 2016-06-11 DIAGNOSIS — N2581 Secondary hyperparathyroidism of renal origin: Secondary | ICD-10-CM | POA: Diagnosis not present

## 2016-06-11 DIAGNOSIS — D631 Anemia in chronic kidney disease: Secondary | ICD-10-CM | POA: Diagnosis not present

## 2016-06-13 DIAGNOSIS — D631 Anemia in chronic kidney disease: Secondary | ICD-10-CM | POA: Diagnosis not present

## 2016-06-13 DIAGNOSIS — N186 End stage renal disease: Secondary | ICD-10-CM | POA: Diagnosis not present

## 2016-06-13 DIAGNOSIS — D509 Iron deficiency anemia, unspecified: Secondary | ICD-10-CM | POA: Diagnosis not present

## 2016-06-13 DIAGNOSIS — N2581 Secondary hyperparathyroidism of renal origin: Secondary | ICD-10-CM | POA: Diagnosis not present

## 2016-06-14 ENCOUNTER — Other Ambulatory Visit: Payer: Self-pay | Admitting: Internal Medicine

## 2016-06-16 ENCOUNTER — Inpatient Hospital Stay (HOSPITAL_COMMUNITY)
Admission: EM | Admit: 2016-06-16 | Discharge: 2016-06-17 | DRG: 189 | Disposition: A | Discharge status: Home / Self Care | Payer: Medicare Other | Attending: Internal Medicine | Admitting: Internal Medicine

## 2016-06-16 ENCOUNTER — Encounter (HOSPITAL_COMMUNITY): Payer: Self-pay | Admitting: *Deleted

## 2016-06-16 ENCOUNTER — Emergency Department (HOSPITAL_COMMUNITY): Payer: Medicare Other

## 2016-06-16 ENCOUNTER — Inpatient Hospital Stay (HOSPITAL_COMMUNITY): Payer: Medicare Other

## 2016-06-16 DIAGNOSIS — I160 Hypertensive urgency: Secondary | ICD-10-CM | POA: Diagnosis present

## 2016-06-16 DIAGNOSIS — D631 Anemia in chronic kidney disease: Secondary | ICD-10-CM | POA: Diagnosis present

## 2016-06-16 DIAGNOSIS — N2581 Secondary hyperparathyroidism of renal origin: Secondary | ICD-10-CM | POA: Diagnosis not present

## 2016-06-16 DIAGNOSIS — R0602 Shortness of breath: Secondary | ICD-10-CM

## 2016-06-16 DIAGNOSIS — T424X5A Adverse effect of benzodiazepines, initial encounter: Secondary | ICD-10-CM | POA: Diagnosis present

## 2016-06-16 DIAGNOSIS — I161 Hypertensive emergency: Secondary | ICD-10-CM | POA: Diagnosis not present

## 2016-06-16 DIAGNOSIS — Z992 Dependence on renal dialysis: Secondary | ICD-10-CM

## 2016-06-16 DIAGNOSIS — D649 Anemia, unspecified: Secondary | ICD-10-CM | POA: Diagnosis present

## 2016-06-16 DIAGNOSIS — J9601 Acute respiratory failure with hypoxia: Principal | ICD-10-CM | POA: Diagnosis present

## 2016-06-16 DIAGNOSIS — E875 Hyperkalemia: Secondary | ICD-10-CM | POA: Diagnosis present

## 2016-06-16 DIAGNOSIS — Z4659 Encounter for fitting and adjustment of other gastrointestinal appliance and device: Secondary | ICD-10-CM

## 2016-06-16 DIAGNOSIS — I120 Hypertensive chronic kidney disease with stage 5 chronic kidney disease or end stage renal disease: Secondary | ICD-10-CM | POA: Diagnosis not present

## 2016-06-16 DIAGNOSIS — G40909 Epilepsy, unspecified, not intractable, without status epilepticus: Secondary | ICD-10-CM | POA: Diagnosis present

## 2016-06-16 DIAGNOSIS — B20 Human immunodeficiency virus [HIV] disease: Secondary | ICD-10-CM | POA: Diagnosis present

## 2016-06-16 DIAGNOSIS — J449 Chronic obstructive pulmonary disease, unspecified: Secondary | ICD-10-CM | POA: Diagnosis present

## 2016-06-16 DIAGNOSIS — N186 End stage renal disease: Secondary | ICD-10-CM | POA: Diagnosis present

## 2016-06-16 DIAGNOSIS — J329 Chronic sinusitis, unspecified: Secondary | ICD-10-CM | POA: Diagnosis present

## 2016-06-16 DIAGNOSIS — R4182 Altered mental status, unspecified: Secondary | ICD-10-CM | POA: Diagnosis present

## 2016-06-16 DIAGNOSIS — J810 Acute pulmonary edema: Secondary | ICD-10-CM

## 2016-06-16 DIAGNOSIS — Z87891 Personal history of nicotine dependence: Secondary | ICD-10-CM

## 2016-06-16 DIAGNOSIS — E872 Acidosis: Secondary | ICD-10-CM | POA: Diagnosis present

## 2016-06-16 DIAGNOSIS — R0603 Acute respiratory distress: Secondary | ICD-10-CM | POA: Diagnosis present

## 2016-06-16 DIAGNOSIS — M898X9 Other specified disorders of bone, unspecified site: Secondary | ICD-10-CM | POA: Diagnosis present

## 2016-06-16 DIAGNOSIS — G9341 Metabolic encephalopathy: Secondary | ICD-10-CM | POA: Diagnosis present

## 2016-06-16 DIAGNOSIS — I509 Heart failure, unspecified: Secondary | ICD-10-CM | POA: Diagnosis present

## 2016-06-16 DIAGNOSIS — D72829 Elevated white blood cell count, unspecified: Secondary | ICD-10-CM | POA: Diagnosis present

## 2016-06-16 DIAGNOSIS — Z4682 Encounter for fitting and adjustment of non-vascular catheter: Secondary | ICD-10-CM | POA: Diagnosis not present

## 2016-06-16 DIAGNOSIS — R0902 Hypoxemia: Secondary | ICD-10-CM

## 2016-06-16 DIAGNOSIS — M109 Gout, unspecified: Secondary | ICD-10-CM | POA: Diagnosis present

## 2016-06-16 DIAGNOSIS — E8770 Fluid overload, unspecified: Secondary | ICD-10-CM | POA: Diagnosis not present

## 2016-06-16 DIAGNOSIS — E785 Hyperlipidemia, unspecified: Secondary | ICD-10-CM | POA: Diagnosis present

## 2016-06-16 DIAGNOSIS — Z825 Family history of asthma and other chronic lower respiratory diseases: Secondary | ICD-10-CM

## 2016-06-16 DIAGNOSIS — Z79899 Other long term (current) drug therapy: Secondary | ICD-10-CM

## 2016-06-16 DIAGNOSIS — J90 Pleural effusion, not elsewhere classified: Secondary | ICD-10-CM | POA: Diagnosis not present

## 2016-06-16 DIAGNOSIS — R069 Unspecified abnormalities of breathing: Secondary | ICD-10-CM | POA: Diagnosis not present

## 2016-06-16 DIAGNOSIS — Z9115 Patient's noncompliance with renal dialysis: Secondary | ICD-10-CM

## 2016-06-16 LAB — CBC WITH DIFFERENTIAL/PLATELET
Basophils Absolute: 0 10*3/uL (ref 0.0–0.1)
Basophils Relative: 0 %
EOS ABS: 0.4 10*3/uL (ref 0.0–0.7)
EOS PCT: 2 %
HCT: 29.1 % — ABNORMAL LOW (ref 39.0–52.0)
Hemoglobin: 9.2 g/dL — ABNORMAL LOW (ref 13.0–17.0)
LYMPHS ABS: 3.1 10*3/uL (ref 0.7–4.0)
LYMPHS PCT: 16 %
MCH: 28.8 pg (ref 26.0–34.0)
MCHC: 31.6 g/dL (ref 30.0–36.0)
MCV: 90.9 fL (ref 78.0–100.0)
MONO ABS: 0.8 10*3/uL (ref 0.1–1.0)
MONOS PCT: 4 %
Neutro Abs: 15.1 10*3/uL — ABNORMAL HIGH (ref 1.7–7.7)
Neutrophils Relative %: 78 %
PLATELETS: 214 10*3/uL (ref 150–400)
RBC: 3.2 MIL/uL — AB (ref 4.22–5.81)
RDW: 18 % — ABNORMAL HIGH (ref 11.5–15.5)
WBC: 19.4 10*3/uL — AB (ref 4.0–10.5)

## 2016-06-16 LAB — I-STAT CHEM 8, ED
BUN: 67 mg/dL — ABNORMAL HIGH (ref 6–20)
Calcium, Ion: 1.08 mmol/L — ABNORMAL LOW (ref 1.15–1.40)
Chloride: 105 mmol/L (ref 101–111)
Creatinine, Ser: 12.9 mg/dL — ABNORMAL HIGH (ref 0.61–1.24)
Glucose, Bld: 101 mg/dL — ABNORMAL HIGH (ref 65–99)
HCT: 31 % — ABNORMAL LOW (ref 39.0–52.0)
HEMOGLOBIN: 10.5 g/dL — AB (ref 13.0–17.0)
POTASSIUM: 6.3 mmol/L — AB (ref 3.5–5.1)
SODIUM: 140 mmol/L (ref 135–145)
TCO2: 25 mmol/L (ref 0–100)

## 2016-06-16 LAB — GLUCOSE, CAPILLARY
GLUCOSE-CAPILLARY: 72 mg/dL (ref 65–99)
Glucose-Capillary: 137 mg/dL — ABNORMAL HIGH (ref 65–99)

## 2016-06-16 LAB — BASIC METABOLIC PANEL
Anion gap: 15 (ref 5–15)
BUN: 61 mg/dL — AB (ref 6–20)
CHLORIDE: 100 mmol/L — AB (ref 101–111)
CO2: 23 mmol/L (ref 22–32)
CREATININE: 12.54 mg/dL — AB (ref 0.61–1.24)
Calcium: 9 mg/dL (ref 8.9–10.3)
GFR calc Af Amer: 5 mL/min — ABNORMAL LOW (ref >60)
GFR, EST NON AFRICAN AMERICAN: 4 mL/min — AB (ref >60)
GLUCOSE: 102 mg/dL — AB (ref 65–99)
POTASSIUM: 6.6 mmol/L — AB (ref 3.5–5.1)
SODIUM: 138 mmol/L (ref 135–145)

## 2016-06-16 LAB — I-STAT ARTERIAL BLOOD GAS, ED
Acid-base deficit: 2 mmol/L (ref 0.0–2.0)
BICARBONATE: 29.3 mmol/L — AB (ref 20.0–28.0)
O2 SAT: 99 %
PCO2 ART: 98.6 mmHg — AB (ref 32.0–48.0)
Patient temperature: 98.6
TCO2: 32 mmol/L (ref 0–100)
pH, Arterial: 7.082 — CL (ref 7.350–7.450)
pO2, Arterial: 189 mmHg — ABNORMAL HIGH (ref 83.0–108.0)

## 2016-06-16 LAB — PHOSPHORUS: PHOSPHORUS: 6.4 mg/dL — AB (ref 2.5–4.6)

## 2016-06-16 LAB — I-STAT TROPONIN, ED: Troponin i, poc: 0 ng/mL (ref 0.00–0.08)

## 2016-06-16 LAB — I-STAT CG4 LACTIC ACID, ED: LACTIC ACID, VENOUS: 1.29 mmol/L (ref 0.5–1.9)

## 2016-06-16 LAB — MRSA PCR SCREENING: MRSA BY PCR: NEGATIVE

## 2016-06-16 LAB — MAGNESIUM: MAGNESIUM: 2.1 mg/dL (ref 1.7–2.4)

## 2016-06-16 MED ORDER — CARVEDILOL 25 MG PO TABS
25.0000 mg | ORAL_TABLET | Freq: Two times a day (BID) | ORAL | Status: DC
  Administered 2016-06-16 – 2016-06-17 (×2): 25 mg via ORAL
  Filled 2016-06-16 (×3): qty 2

## 2016-06-16 MED ORDER — HEPARIN SODIUM (PORCINE) 5000 UNIT/ML IJ SOLN
5000.0000 [IU] | Freq: Three times a day (TID) | INTRAMUSCULAR | Status: DC
  Administered 2016-06-16 – 2016-06-17 (×3): 5000 [IU] via SUBCUTANEOUS
  Filled 2016-06-16 (×3): qty 1

## 2016-06-16 MED ORDER — ALTEPLASE 2 MG IJ SOLR: 2.0000 mg | Freq: Once | INTRAMUSCULAR | Status: DC | PRN

## 2016-06-16 MED ORDER — DEXTROSE 50 % IV SOLN
1.0000 | Freq: Once | INTRAVENOUS | Status: AC
  Administered 2016-06-16: 50 mL via INTRAVENOUS
  Filled 2016-06-16: qty 50

## 2016-06-16 MED ORDER — SODIUM CHLORIDE 0.9 % IV SOLN: 100.0000 mL | INTRAVENOUS | Status: DC | PRN

## 2016-06-16 MED ORDER — ACETAMINOPHEN 325 MG PO TABS
650.0000 mg | ORAL_TABLET | Freq: Four times a day (QID) | ORAL | Status: DC | PRN
  Administered 2016-06-16 – 2016-06-17 (×3): 650 mg via ORAL
  Filled 2016-06-16 (×3): qty 2

## 2016-06-16 MED ORDER — DOXAZOSIN MESYLATE 4 MG PO TABS
8.0000 mg | ORAL_TABLET | Freq: Every day | ORAL | Status: DC
  Administered 2016-06-16 – 2016-06-17 (×2): 8 mg via ORAL
  Filled 2016-06-16: qty 2
  Filled 2016-06-16 (×2): qty 1
  Filled 2016-06-16: qty 2

## 2016-06-16 MED ORDER — SODIUM CHLORIDE 0.9 % IV SOLN
1.0000 g | Freq: Once | INTRAVENOUS | Status: AC
  Administered 2016-06-16: 1 g via INTRAVENOUS

## 2016-06-16 MED ORDER — DOLUTEGRAVIR SODIUM 50 MG PO TABS
50.0000 mg | ORAL_TABLET | Freq: Every day | ORAL | Status: DC
  Administered 2016-06-16 – 2016-06-17 (×2): 50 mg via ORAL
  Filled 2016-06-16 (×3): qty 1

## 2016-06-16 MED ORDER — INSULIN ASPART 100 UNIT/ML IV SOLN
10.0000 [IU] | Freq: Once | INTRAVENOUS | Status: AC
  Administered 2016-06-16: 10 [IU] via INTRAVENOUS
  Filled 2016-06-16: qty 1

## 2016-06-16 MED ORDER — LIDOCAINE-PRILOCAINE 2.5-2.5 % EX CREA: 1.0000 "application " | TOPICAL_CREAM | CUTANEOUS | Status: DC | PRN

## 2016-06-16 MED ORDER — PENTAFLUOROPROP-TETRAFLUOROETH EX AERO
1.0000 "application " | INHALATION_SPRAY | CUTANEOUS | Status: DC | PRN
  Filled 2016-06-16: qty 30

## 2016-06-16 MED ORDER — FAMOTIDINE 20 MG PO TABS
20.0000 mg | ORAL_TABLET | Freq: Every day | ORAL | Status: DC
  Administered 2016-06-16 – 2016-06-17 (×2): 20 mg via ORAL
  Filled 2016-06-16 (×2): qty 1

## 2016-06-16 MED ORDER — ROCURONIUM BROMIDE 50 MG/5ML IV SOLN
100.0000 mg | Freq: Once | INTRAVENOUS | Status: AC
  Administered 2016-06-16: 100 mg via INTRAVENOUS

## 2016-06-16 MED ORDER — SODIUM CHLORIDE 0.9 % IV SOLN
1.0000 g | Freq: Once | INTRAVENOUS | Status: DC
  Filled 2016-06-16: qty 10

## 2016-06-16 MED ORDER — FAMOTIDINE 40 MG/5ML PO SUSR
20.0000 mg | Freq: Every day | ORAL | Status: DC
  Filled 2016-06-16: qty 2.5

## 2016-06-16 MED ORDER — ETOMIDATE 2 MG/ML IV SOLN: 0.3000 mg/kg | Freq: Once | INTRAVENOUS | Status: DC

## 2016-06-16 MED ORDER — ALBUTEROL SULFATE (2.5 MG/3ML) 0.083% IN NEBU: 2.5000 mg | INHALATION_SOLUTION | Freq: Four times a day (QID) | RESPIRATORY_TRACT | Status: DC | PRN

## 2016-06-16 MED ORDER — HEPARIN SODIUM (PORCINE) 1000 UNIT/ML DIALYSIS: 20.0000 [IU]/kg | INTRAMUSCULAR | Status: DC | PRN

## 2016-06-16 MED ORDER — HEPARIN SODIUM (PORCINE) 1000 UNIT/ML DIALYSIS: 1000.0000 [IU] | INTRAMUSCULAR | Status: DC | PRN

## 2016-06-16 MED ORDER — ORAL CARE MOUTH RINSE
15.0000 mL | OROMUCOSAL | Status: DC
  Administered 2016-06-16 (×5): 15 mL via OROMUCOSAL

## 2016-06-16 MED ORDER — ORAL CARE MOUTH RINSE
15.0000 mL | Freq: Two times a day (BID) | OROMUCOSAL | Status: DC
  Administered 2016-06-16 – 2016-06-17 (×2): 15 mL via OROMUCOSAL

## 2016-06-16 MED ORDER — PROPOFOL 1000 MG/100ML IV EMUL
5.0000 ug/kg/min | INTRAVENOUS | Status: DC
  Administered 2016-06-16: 5 ug/kg/min via INTRAVENOUS
  Filled 2016-06-16 (×2): qty 100

## 2016-06-16 MED ORDER — HYDRALAZINE HCL 10 MG PO TABS
10.0000 mg | ORAL_TABLET | Freq: Three times a day (TID) | ORAL | Status: DC
  Administered 2016-06-16 – 2016-06-17 (×3): 10 mg via ORAL
  Filled 2016-06-16 (×5): qty 1

## 2016-06-16 MED ORDER — FAMOTIDINE 40 MG/5ML PO SUSR
20.0000 mg | Freq: Two times a day (BID) | ORAL | Status: DC
  Administered 2016-06-16: 20 mg
  Filled 2016-06-16: qty 2.5

## 2016-06-16 MED ORDER — AMLODIPINE BESYLATE 10 MG PO TABS
10.0000 mg | ORAL_TABLET | Freq: Every day | ORAL | Status: DC
  Administered 2016-06-16: 10 mg via ORAL
  Filled 2016-06-16: qty 2

## 2016-06-16 MED ORDER — PRAVASTATIN SODIUM 40 MG PO TABS
40.0000 mg | ORAL_TABLET | Freq: Every evening | ORAL | Status: DC
  Administered 2016-06-16: 40 mg via ORAL
  Filled 2016-06-16 (×2): qty 1

## 2016-06-16 MED ORDER — ETOMIDATE 2 MG/ML IV SOLN
30.0000 mg | Freq: Once | INTRAVENOUS | Status: AC
  Administered 2016-06-16: 30 mg via INTRAVENOUS

## 2016-06-16 MED ORDER — CHLORHEXIDINE GLUCONATE 0.12% ORAL RINSE (MEDLINE KIT)
15.0000 mL | Freq: Two times a day (BID) | OROMUCOSAL | Status: DC
  Administered 2016-06-16: 15 mL via OROMUCOSAL

## 2016-06-16 MED ORDER — ALBUTEROL SULFATE (2.5 MG/3ML) 0.083% IN NEBU
10.0000 mg | INHALATION_SOLUTION | Freq: Once | RESPIRATORY_TRACT | Status: AC
  Administered 2016-06-16: 10 mg via RESPIRATORY_TRACT
  Filled 2016-06-16: qty 12

## 2016-06-16 MED ORDER — TENOFOVIR DISOPROXIL FUMARATE 300 MG PO TABS
300.0000 mg | ORAL_TABLET | ORAL | Status: DC
  Administered 2016-06-17: 300 mg via ORAL
  Filled 2016-06-16: qty 1

## 2016-06-16 MED ORDER — LIDOCAINE HCL (PF) 1 % IJ SOLN: 5.0000 mL | INTRAMUSCULAR | Status: DC | PRN

## 2016-06-16 MED ORDER — SODIUM CHLORIDE 0.9 % IV SOLN: 250.0000 mL | INTRAVENOUS | Status: DC | PRN

## 2016-06-16 MED ORDER — RILPIVIRINE HCL 25 MG PO TABS
25.0000 mg | ORAL_TABLET | Freq: Every day | ORAL | Status: DC
  Administered 2016-06-16 – 2016-06-17 (×2): 25 mg via ORAL
  Filled 2016-06-16 (×2): qty 1

## 2016-06-16 NOTE — ED Notes (Signed)
Unable to give report

## 2016-06-16 NOTE — ED Triage Notes (Signed)
Pt arrived by [facility] from home dialysis pt missed dialysis on Friday  Sob arrived on c-pap  Intubated 0404a  Fistula rt arm

## 2016-06-16 NOTE — ED Notes (Signed)
Several attempts by 3 rns to place  og and hg  Have failed unable to get the tubed into the stomach

## 2016-06-16 NOTE — Progress Notes (Signed)
   06/16/16 0500  Clinical Encounter Type  Visited With Patient  Visit Type Initial;ED;Critical Care;Psychological support;Spiritual support  Referral From Nurse  Spiritual Encounters  Spiritual Needs Emotional;Prayer  [place] called to comfort wife [name] at bedside of pt; [place] offered prayer along with spiritual and emotional support.  [place] gave spouse contact info for daughter who called in for update on pt status; [facility] available if additional support: Early will refer to ICU [facility] for follow-up. 5:53 AM Gwynn Burly

## 2016-06-16 NOTE — ED Provider Notes (Addendum)
By signing my name below, I, Maud Deed. Royston Sinner, attest that this documentation has been prepared under the direction and in the presence of [place], DO.  Electronically Signed: Maud Deed. Royston Sinner, ED Scribe. 06/16/16. 4:07 AM.   TIME SEEN: 3:51 AM   CHIEF COMPLAINT:  Chief Complaint  Patient presents with  . Shortness of Breath    LEVEL 5 CAVEAT DUE TO RESPIRATORY DISTRESS   HPI:  HPI Comments: Darrell Johnson, brought in by EMS is a 50 y.o. male with a PMHx of CHF, CKD, ESRD On hemodialysis Monday, Wednesday and Friday, HIV, HTN, who presents to the Emergency Department here for respiratory distress this evening. Per EMS, pt was hypoxic on arrival and en route. Pt was agitated and was given a total of 7.5 mg of Versed. Oxygen saturation was in the high 70's on RA. Pt was then placed on CPAP. He was also given 0.5mg  of Atrovent and 5mg  of Albuterol. Pt typically dialyzes on Mondays, Wednesdays, and Fridays. However, pt missed his treatment on Friday and was due for treatment later today.    PCP: Jule Ser, DO    ROS: LEVEL 5 CAVEAT DUE TO ALTERED MENTAL STATUS   PAST MEDICAL HISTORY/PAST SURGICAL HISTORY:  Past Medical History:  Diagnosis Date  . Anemia   . Arthritis   . CHF (congestive heart failure) ([place])   . Chronic kidney disease    stage 3-4 CKD, followed by Dr. Moshe Cipro  . ESRD (end stage renal disease) ([place])    ESRD due to HTN started dialysis Feb 2016  . Gout   . Gout, unspecified 08/13/2009   Qualifier: Diagnosis of  By: Redmond Pulling  MD, Mateo Flow    . H1N1 influenza    March 2016  . History of gout   . History of syphilis   . HIV (human immunodeficiency virus infection) ([place])   . HIV infection ([place]) 1980's   on ART therapy since, followed by ID clinic, complicated  by neuropathy  . HTN (hypertension)   . Hyperlipidemia    hypertrygliceridemia determined ti be secondary to ART therpay  . Hypertension   . HYPERTENSION 05/08/2006  . Male circumcision  11/2005  . Pneumonia   . Rib fractures 01/2009  . Seizure disorder ([place])   . Seizures ([place])    last seizure was >5 years ago, pt has family history of seizures  . 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 Syphilis 1997   history of syphilis 1997  . Tobacco abuse     MEDICATIONS:  Prior to Admission medications   Medication Sig Start Date End Date Taking? Authorizing Provider  albuterol (PROVENTIL HFA;VENTOLIN HFA) 108 (90 Base) MCG/ACT inhaler Inhale 2 puffs into the lungs every 6 (six) hours as needed for wheezing or shortness of breath. 04/01/16   Minus Liberty, MD  albuterol (PROVENTIL HFA;VENTOLIN HFA) 108 (90 Base) MCG/ACT inhaler Inhale 1-2 puffs into the lungs every 6 (six) hours as needed for wheezing or shortness of breath. 06/10/16   Charlesetta Shanks, MD  albuterol (PROVENTIL) (2.5 MG/3ML) 0.083% nebulizer solution Take 3 mLs (2.5 mg total) by nebulization every 6 (six) hours as needed for wheezing or shortness of breath. 04/08/16   Jule Ser, DO  amLODipine (NORVASC) 5 MG tablet Take 2 tablets (10 mg total) by mouth daily. Patient taking differently: Take 10 mg  by mouth at bedtime.  08/09/15 08/08/16  Jule Ser, DO  buPROPion (WELLBUTRIN) 75 MG tablet Take 0.5 tablets (37.5 mg total) by mouth 2 (two) times daily. 04/08/16   Jule Ser, DO  carvedilol (COREG) 25 MG tablet TAKE 1 TABLET (25 MG TOTAL) BY MOUTH 2 (TWO) TIMES DAILY WITH A MEAL. 01/15/16   Jule Ser, DO  colchicine 0.6 MG tablet 1 capsule PO, 3 times per week after HD 05/26/16 05/26/17  Jule Ser, DO  cyclobenzaprine (FLEXERIL) 10 MG tablet Take 1 tablet (10 mg total) by mouth 2 (two) times daily as needed for muscle spasms. 06/10/16   Charlesetta Shanks, MD  dolutegravir (TIVICAY) 50 MG tablet Take 1 tablet (50 mg total) by mouth daily. 01/03/16   Carlyle Basques,  MD  doxazosin (CARDURA) 8 MG tablet TAKE 1 TABLET (8 MG TOTAL) BY MOUTH AT BEDTIME. 04/22/16   Bartholomew Crews, MD  Febuxostat (ULORIC) 80 MG TABS Take 1 tablet by mouth daily.    Historical Provider, MD  furosemide (LASIX) 40 MG tablet TAKE 1 TABLET (40 MG TOTAL) BY MOUTH DAILY. 01/15/16   Jule Ser, DO  guaiFENesin (MUCINEX) 600 MG 12 hr tablet Take 1 tablet (600 mg total) by mouth 2 (two) times daily. 06/03/16 06/03/17  Ledell Noss, MD  guaiFENesin (ROBITUSSIN) 100 MG/5ML SOLN Take 5 mLs (100 mg total) by mouth every 4 (four) hours as needed for cough or to loosen phlegm. 06/03/16   Ledell Noss, MD  hydrALAZINE (APRESOLINE) 10 MG tablet Take 1 tablet (10 mg total) by mouth 3 (three) times daily. 01/29/16   Florinda Marker, MD  HYDROcodone-acetaminophen (NORCO/VICODIN) 5-325 MG tablet Take 1-2 tablets by mouth every 4 (four) hours as needed for moderate pain or severe pain. 06/10/16   Charlesetta Shanks, MD  methocarbamol (ROBAXIN) 500 MG tablet Take 1 tablet (500 mg total) by mouth 2 (two) times daily. Patient not taking: Reported on 03/31/2016 01/15/16   Olivia Canter Sam, PA-C  montelukast (SINGULAIR) 10 MG tablet Take 10 mg by mouth at bedtime.    Historical Provider, MD  pravastatin (PRAVACHOL) 40 MG tablet Take 1 tablet (40 mg total) by mouth every evening. 04/08/16 04/08/17  Jule Ser, DO  RENVELA 800 MG tablet Take 2,400 mg by mouth 3 (three) times daily with meals.  11/21/14   Historical Provider, MD  rilpivirine (EDURANT) 25 MG TABS tablet Take 1 tablet (25 mg total) by mouth daily with breakfast. 01/03/16   Carlyle Basques, MD  tenofovir (VIREAD) 300 MG tablet TAKE 1 TABLET BY MOUTH ONCE A WEEK Patient taking differently: Take 300 mg by mouth once a week. Sunday 01/03/16   Carlyle Basques, MD    ALLERGIES:  No Known Allergies  SOCIAL HISTORY:  Social History  Substance Use Topics  . Smoking status: Former Smoker    Packs/day: 0.40    Years: 20.00    Start date: 01/10/2016    Quit date:  04/07/2016  . Smokeless tobacco: Never Used  . Alcohol use No    FAMILY HISTORY: Family History  Problem Relation Age of Onset  . Cancer Mother   . Hypertension Mother   . COPD Father   . Hypertension Father   . Diabetes Sister   . Hypertension Sister   . Diabetes Brother   . Hypertension Brother   . Stroke Neg Hx     EXAM: BP 171/82   Pulse 84   Resp 18   SpO2 100%  CONSTITUTIONAL: GCS of 3, does not  moves extruded spontaneously, open eyes. His breathing on his own. Does not talk or follow commands. HEAD: Normocephalic EYES: Conjunctivae clear; Pupils are 2-3 mm bilaterally and sluggish. ENT: normal nose; no rhinorrhea; moist mucous membranes NECK: Supple, no meningismus, no nuchal rigidity, no LAD  CARD: RRR; S1 and S2 appreciated; no murmurs, no clicks, no rubs, no gallops RESP: Tachypnea noted; Diffuse rhonchus breath sounds, patient is breathing on his own, he is currently on C Pap ABD/GI: Normal bowel sounds; non-distended; soft BACK:  The back appears normal EXT:  no edema; normal capillary refill; no cyanosis, no calf tenderness or swelling; AV fistula in R forearm with good thrill. No surrounding erythema or warmth   SKIN: Normal color for age and race; warm; no rash NEURO: GCS of 3   MEDICAL DECISION MAKING: Patient here with respiratory distress and altered mental status. Suspect that his GCS is 3 because of the Versed he was given for agitation. Patient was talking prior to arrival. He was in severe respiratory distress with EMS and sats of 78% on room air. Given albuterol, Atrovent without relief. He has missed dialysis. Progress breath sounds diffusely on exam. Patient intubated secondary to GCS of 3 and that he cannot protect his airway as well as his respiratory status. Sats in the 60s on C Pap.  ED PROGRESS: Chest x-ray shows pulmonary edema. Potassium is 6.3. EKG shows peaked T waves without interval changes. We'll give albuterol, calcium, insulin and  D50.   4:25 AM  D/w critical care fellow who is at bedside for admission. Appreciate his help. Discussed with Dr. Moshe Cipro with nephrology for emergent dialysis. They will see the patient this morning.      INTUBATION Performed by: Nyra Jabs  Required items: required blood products, implants, devices, and special equipment available Patient identity confirmed: provided demographic data and hospital-assigned identification number Time out: Immediately prior to procedure a "time out" was called to verify the correct patient, procedure, equipment, support staff and site/side marked as required.  Indications: Airway protection, respiratory failure   Intubation method: Glidescope Laryngoscopy   Preoxygenation: BVM  Sedatives: 30 mg IV Etomidate Paralytic: 100 mg IV rocuronium   Tube Size: 7.5 cuffed  Post-procedure assessment: chest rise and ETCO2 monitor Breath sounds: equal and absent over the epigastrium Tube secured with: ETT holder Chest x-ray interpreted by radiologist and me.  Chest x-ray findings: endotracheal tube in appropriate position  Patient tolerated the procedure well with no immediate complications.      EKG Interpretation  Date/Time:  Monday June 16 2016 04:21:11 EST Ventricular Rate:  92 PR Interval:    QRS Duration: 111 QT Interval:  371 QTC Calculation: 459 R Axis:   76 Text Interpretation:  Sinus rhythm Probable left atrial enlargement RSR' in V1 or V2, right VCD or RVH ST elev, probable normal early repol pattern Confirmed by Callaway Hardigree,  DO, Jawanda Passey ([id number]) on 06/16/2016 4:27:25 AM        CRITICAL CARE Performed by: Nyra Jabs   Total critical care time: 45 minutes  Critical care time was exclusive of separately billable procedures and treating other patients.  Critical care was necessary to treat or prevent imminent or life-threatening deterioration.  Critical care was time spent personally by me on the following  activities: development of treatment plan with patient and/or surrogate as well as nursing, discussions with consultants, evaluation of patient's response to treatment, examination of patient, obtaining history from patient or surrogate, ordering and performing treatments and interventions,  ordering and review of laboratory studies, ordering and review of radiographic studies, pulse oximetry and re-evaluation of patient's condition.   I personally performed the services described in this documentation, which was scribed in my presence. The recorded information has been reviewed and is accurate.    [place], DO 06/16/16 0428    4:45 AM Pt's blood gas shows respiratory acidosis. We have increased his rate.   [place], DO 06/16/16 [phone]

## 2016-06-16 NOTE — Procedures (Signed)
Extubation Procedure Note  Patient Details:   Name: Darrell Johnson DOB: 1965-08-15 MRN: [id number]   Airway Documentation:     Evaluation  O2 sats: stable throughout Complications: No apparent complications Patient did tolerate procedure well. Bilateral Breath Sounds: Clear   Yes, Placed on 4L [facility] SPO2 97%  Gonzella Lex 06/16/2016, 3:07 PM

## 2016-06-16 NOTE — ED Notes (Signed)
Report called to 4n 

## 2016-06-16 NOTE — Progress Notes (Signed)
eLink Physician-Brief Progress Note Patient Name: JAMEZ AMBROCIO DOB: [birth year] MRN: [id number]   Date of Service  06/16/2016  HPI/Events of Note  Pt c/o headache.  eICU Interventions  Will order prn tylenol.     Intervention Category Major Interventions: Other:  Reylene Stauder 06/16/2016, 5:39 PM

## 2016-06-16 NOTE — Consult Note (Signed)
Reason for Consult: To manage dialysis and dialysis related needs Referring Physician: [name] is an 50 y.o. male with past medical history significant for HIV, hypertension, hyperlipidemia as well as ESRD on dialysis Monday Wednesday Friday at the [facility]. patient was noted to missed dialysis on Friday. His last treatment on Wednesday he left out 2.2 kg over his dry weight. He called EMS complaining of shortness of breath, upon arrival to the emergency department he was hypoxic and had altered mental status. He was intubated in the emergency department. His blood pressure is high. He is going to be admitted to the ICU but I am called to provide dialysis. Chest x-ray shows diffuse pulmonary edema. Potassium was 6.6 with slight hemolysis   Dialyzes at [place] 105. Monday Wednesday Friday, 4 hours and 15 minutes HD Bath 2 potassium, 2.5 calcium, Dialyzer 180, Heparin 6000 bolus. Access right aVF. Meds include Hectorol 4 Vicryl grams every treatment and Mircera given on 11/15 but most recent hemoglobin was 8.8  Past Medical History:  Diagnosis Date  . Anemia   . Arthritis   . CHF (congestive heart failure) ([place])   . Chronic kidney disease    stage 3-4 CKD, followed by Dr. Moshe Cipro  . ESRD (end stage renal disease) ([place])    ESRD due to HTN started dialysis Feb 2016  . Gout   . Gout, unspecified 08/13/2009   Qualifier: Diagnosis of  By: Redmond Pulling  MD, Mateo Flow    . H1N1 influenza    March 2016  . History of gout   . History of syphilis   . HIV (human immunodeficiency virus infection) ([place])   . HIV infection ([place]) 1980's   on ART therapy since, followed by ID clinic, complicated  by neuropathy  . HTN (hypertension)   . Hyperlipidemia    hypertrygliceridemia determined ti be secondary to ART therpay  . Hypertension   . HYPERTENSION 05/08/2006  . 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 Pneumonia   . Rib fractures 01/2009  . Seizure disorder ([place])   . Seizures  ([place])    last seizure was >5 years ago, pt has family history of seizures  . 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 Syphilis 1997   history of syphilis 1997  . Tobacco abuse     Past Surgical History:  Procedure Laterality Date  . ABSCESS DRAINAGE    . AV FISTULA PLACEMENT Right 12/02/2013   Procedure: ARTERIOVENOUS (AV) FISTULA CREATION;  Surgeon: Rosetta Posner, MD;  Location: [facility] OR;  Service: Vascular;  Laterality: Right;    Family History  Problem Relation Age of Onset  . Cancer Mother   . Hypertension Mother   . COPD Father   . Hypertension Father   . Diabetes Sister   . Hypertension Sister   . Diabetes Brother   . Hypertension Brother   . Stroke Neg Hx     Social History:  reports that he quit smoking about 2 months ago. He started smoking about 5 months ago. He has a 8.00 pack-year smoking history. He has never used smokeless tobacco. He reports that he does not drink alcohol or use drugs.  Allergies: No Known Allergies  Medications: I have reviewed the patient's current medications.   Results for orders placed or performed during the hospital encounter of 06/16/16 (  from the past 48 hour(s))  I-stat troponin, ED     Status: None   Collection Time: 06/16/16  4:01 AM  Result Value Ref Range   Troponin i, poc 0.00 0.00 - 0.08 ng/mL   Comment 3            Comment: Due to the release kinetics of cTnI, a negative result within the first hours of the onset of symptoms does not rule out myocardial infarction with certainty. If myocardial infarction is still suspected, repeat the test at appropriate intervals.   I-Stat CG4 Lactic Acid, ED     Status: None   Collection Time: 06/16/16  4:03 AM  Result Value Ref Range   Lactic Acid, Venous 1.29 0.5 - 1.9 mmol/L  I-stat chem 8, ed     Status: Abnormal   Collection Time: 06/16/16   4:04 AM  Result Value Ref Range   Sodium 140 135 - 145 mmol/L   Potassium 6.3 (HH) 3.5 - 5.1 mmol/L   Chloride 105 101 - 111 mmol/L   BUN 67 (H) 6 - 20 mg/dL   Creatinine, Ser 12.90 (H) 0.61 - 1.24 mg/dL   Glucose, Bld 101 (H) 65 - 99 mg/dL   Calcium, Ion 1.08 (L) 1.15 - 1.40 mmol/L   TCO2 25 0 - 100 mmol/L   Hemoglobin 10.5 (L) 13.0 - 17.0 g/dL   HCT 31.0 (L) 39.0 - 52.0 %   Comment NOTIFIED PHYSICIAN   CBC with Differential     Status: Abnormal   Collection Time: 06/16/16  4:12 AM  Result Value Ref Range   WBC 19.4 (H) 4.0 - 10.5 K/uL   RBC 3.20 (L) 4.22 - 5.81 MIL/uL   Hemoglobin 9.2 (L) 13.0 - 17.0 g/dL   HCT 29.1 (L) 39.0 - 52.0 %   MCV 90.9 78.0 - 100.0 fL   MCH 28.8 26.0 - 34.0 pg   MCHC 31.6 30.0 - 36.0 g/dL   RDW 18.0 (H) 11.5 - 15.5 %   Platelets 214 150 - 400 K/uL   Neutrophils Relative % 78 %   Neutro Abs 15.1 (H) 1.7 - 7.7 K/uL   Lymphocytes Relative 16 %   Lymphs Abs 3.1 0.7 - 4.0 K/uL   Monocytes Relative 4 %   Monocytes Absolute 0.8 0.1 - 1.0 K/uL   Eosinophils Relative 2 %   Eosinophils Absolute 0.4 0.0 - 0.7 K/uL   Basophils Relative 0 %   Basophils Absolute 0.0 0.0 - 0.1 K/uL  Basic metabolic panel     Status: Abnormal   Collection Time: 06/16/16  4:12 AM  Result Value Ref Range   Sodium 138 135 - 145 mmol/L   Potassium 6.6 (HH) 3.5 - 5.1 mmol/L    Comment: SLIGHT HEMOLYSIS CRITICAL RESULT CALLED TO, READ BACK BY AND VERIFIED WITH: HINSON D,RN 06/16/16 0454 WAYK    Chloride 100 (L) 101 - 111 mmol/L   CO2 23 22 - 32 mmol/L   Glucose, Bld 102 (H) 65 - 99 mg/dL   BUN 61 (H) 6 - 20 mg/dL   Creatinine, Ser 12.54 (H) 0.61 - 1.24 mg/dL   Calcium 9.0 8.9 - 10.3 mg/dL   GFR calc non Af Amer 4 (L) >60 mL/min   GFR calc Af Amer 5 (L) >60 mL/min    Comment: (NOTE) The eGFR has been calculated using the CKD EPI equation. This calculation has not been validated in all clinical situations. eGFR's persistently <60 mL/min signify possible Chronic  Kidney Disease.  Anion gap 15 5 - 15  Magnesium     Status: None   Collection Time: 06/16/16  4:12 AM  Result Value Ref Range   Magnesium 2.1 1.7 - 2.4 mg/dL  Phosphorus     Status: Abnormal   Collection Time: 06/16/16  4:12 AM  Result Value Ref Range   Phosphorus 6.4 (H) 2.5 - 4.6 mg/dL  I-Stat arterial blood gas, ED     Status: Abnormal   Collection Time: 06/16/16  4:37 AM  Result Value Ref Range   pH, Arterial 7.082 (LL) 7.350 - 7.450   pCO2 arterial 98.6 (HH) 32.0 - 48.0 mmHg   pO2, Arterial 189.0 (H) 83.0 - 108.0 mmHg   Bicarbonate 29.3 (H) 20.0 - 28.0 mmol/L   TCO2 32 0 - 100 mmol/L   O2 Saturation 99.0 %   Acid-base deficit 2.0 0.0 - 2.0 mmol/L   Patient temperature 98.6 F    Collection site RADIAL, ALLEN'S TEST ACCEPTABLE    Drawn by Operator    Sample type ARTERIAL    Comment NOTIFIED PHYSICIAN     Dg Chest Portable 1 View  Result Date: 06/16/2016 CLINICAL DATA:  50 y/o M; missed dialysis appointment. Shortness of breath. EXAM: PORTABLE CHEST 1 VIEW COMPARISON:  03/31/2016 chest radiograph FINDINGS: Cardiomegaly. Diffuse patchy opacification of the lungs probably represents moderate pulmonary edema. Small right pleural effusion and basilar atelectasis. Endotracheal tube approximately 2.5 cm from the carina. Chronic right lateral rib fractures. IMPRESSION: Moderate pulmonary edema, small right pleural effusion, cardiomegaly. Endotracheal tube approximately 2.5 cm from carina. Electronically Signed   By: Kristine Garbe M.D.   On: 06/16/2016 04:28    ROS: Not obtainable as patient is intubated Blood pressure 183/100, pulse 85, resp. rate 20, SpO2 100 %. General appearance: mild distress and On vent Resp: diminished breath sounds bilaterally Cardio: regular rate and rhythm, S1, S2 normal, no murmur, click, rub or gallop GI: soft, non-tender; bowel sounds normal; no masses,  no organomegaly Extremities: edema 1+ Right arm AV fistula with good thrill and  bruit  Assessment/Plan: 50 year old black male with HIV, hypertension and ESRD. He presents after missing dialysis hypoxic likely due to volume overload and also hyperkalemia 1 volume overload/hypoxia- likely due to missed dialysis and leaving above dry weight with last dialysis on Wednesday. We'll make arrangements for patient to receive dialysis in the intensive care unit with maximum volume removal 2 ESRD: Normally Monday was a Friday. Missed Friday. Dialysis today as above 3 Hypertension: Due to volume overload. Should improve with dialysis 4. Anemia of ESRD: This hemoglobin is actually better than his last as an outpatient. We will dose with Mircera 5. Metabolic Bone Disease: Continue Renvela and Hectorol 6. Hyperkalemia- due to missed dialysis. Should resolve with treatment   Antonious Omahoney A 06/16/2016, 7:04 AM

## 2016-06-16 NOTE — H&P (Signed)
PULMONARY / CRITICAL CARE MEDICINE   Name: Darrell Johnson MRN: [id number] DOB: [birth year]    ADMISSION DATE:  06/16/2016  CHIEF COMPLAINT:  Dyspnea  HISTORY OF PRESENT ILLNESS:   Darrell Johnson is a 50 y/o man with a hx of HIV and ESRD on HD who was brought to the ED by EMS for dyspnea and hypoxia and AMS. A family member called EMS and on arrival, Darrell Johnson was agitated and altered and refused to cooperate. Darrell Johnson was hypoxic, and had to be treated with IM versed to be transported. Darrell Johnson was intubated in the ED. Darrell Johnson apparently missed dialysis on Friday (after Thanksgiving), and has been more dyspneic over the Thanksgiving long weekend.   PAST MEDICAL HISTORY :  Darrell Johnson  has a past medical history of Anemia; Arthritis; CHF (congestive heart failure) ([place]); Chronic kidney disease; ESRD (end stage renal disease) ([place]); Gout; Gout, unspecified (08/13/2009); H1N1 influenza; History of gout; History of syphilis; HIV (human immunodeficiency virus infection) ([place]); HIV infection ([place]) (1980's); HTN (hypertension); Hyperlipidemia; Hypertension; HYPERTENSION (05/08/2006); Male circumcision (11/2005); Pneumonia; Rib fractures (01/2009); Seizure disorder ([place]); Seizures ([place]); Sexually transmitted disease; Syphilis (1997); and Tobacco abuse.  PAST SURGICAL HISTORY: Darrell Johnson  has a past surgical history that includes Abscess drainage and AV fistula placement (Right, 12/02/2013).  No Known Allergies  No current facility-administered medications on file prior to encounter.    Current Outpatient Prescriptions on File Prior to Encounter  Medication Sig  . albuterol (PROVENTIL HFA;VENTOLIN HFA) 108 (90 Base) MCG/ACT inhaler Inhale 2 puffs into the lungs every 6 (six) hours as needed for wheezing or shortness of breath.  Marland Kitchen albuterol (PROVENTIL HFA;VENTOLIN HFA) 108 (90 Base) MCG/ACT inhaler Inhale 1-2 puffs into the lungs every 6 (six) hours as needed for wheezing or shortness of breath.  Marland Kitchen albuterol (PROVENTIL) (2.5 MG/3ML) 0.083%  nebulizer solution Take 3 mLs (2.5 mg total) by nebulization every 6 (six) hours as needed for wheezing or shortness of breath.  Marland Kitchen amLODipine (NORVASC) 5 MG tablet Take 2 tablets (10 mg total) by mouth daily. (Patient taking differently: Take 10 mg by mouth at bedtime. )  . buPROPion (WELLBUTRIN) 75 MG tablet Take 0.5 tablets (37.5 mg total) by mouth 2 (two) times daily.  . carvedilol (COREG) 25 MG tablet TAKE 1 TABLET (25 MG TOTAL) BY MOUTH 2 (TWO) TIMES DAILY WITH A MEAL.  Marland Kitchen colchicine 0.6 MG tablet 1 capsule PO, 3 times per week after HD  . cyclobenzaprine (FLEXERIL) 10 MG tablet Take 1 tablet (10 mg total) by mouth 2 (two) times daily as needed for muscle spasms.  . dolutegravir (TIVICAY) 50 MG tablet Take 1 tablet (50 mg total) by mouth daily.  Marland Kitchen doxazosin (CARDURA) 8 MG tablet TAKE 1 TABLET (8 MG TOTAL) BY MOUTH AT BEDTIME.  . Febuxostat (ULORIC) 80 MG TABS Take 1 tablet by mouth daily.  . furosemide (LASIX) 40 MG tablet TAKE 1 TABLET (40 MG TOTAL) BY MOUTH DAILY.  Marland Kitchen guaiFENesin (MUCINEX) 600 MG 12 hr tablet Take 1 tablet (600 mg total) by mouth 2 (two) times daily.  Marland Kitchen guaiFENesin (ROBITUSSIN) 100 MG/5ML SOLN Take 5 mLs (100 mg total) by mouth every 4 (four) hours as needed for cough or to loosen phlegm.  . hydrALAZINE (APRESOLINE) 10 MG tablet Take 1 tablet (10 mg total) by mouth 3 (three) times daily.  Marland Kitchen HYDROcodone-acetaminophen (NORCO/VICODIN) 5-325 MG tablet Take 1-2 tablets by mouth every 4 (four) hours as needed for moderate pain or severe pain.  . methocarbamol (ROBAXIN) 500  MG tablet Take 1 tablet (500 mg total) by mouth 2 (two) times daily. (Patient not taking: Reported on 03/31/2016)  . montelukast (SINGULAIR) 10 MG tablet Take 10 mg by mouth at bedtime.  . pravastatin (PRAVACHOL) 40 MG tablet Take 1 tablet (40 mg total) by mouth every evening.  Marland Kitchen RENVELA 800 MG tablet Take 2,400 mg by mouth 3 (three) times daily with meals.   . rilpivirine (EDURANT) 25 MG TABS tablet Take 1 tablet  (25 mg total) by mouth daily with breakfast.  . tenofovir (VIREAD) 300 MG tablet TAKE 1 TABLET BY MOUTH ONCE A WEEK (Patient taking differently: Take 300 mg by mouth once a week. Sunday)    FAMILY HISTORY:  His indicated that his mother is deceased. Darrell Johnson indicated that his father is deceased. Darrell Johnson indicated that the status of his sister is unknown. Darrell Johnson indicated that the status of his brother is unknown. Darrell Johnson indicated that the status of his neg hx is unknown.    SOCIAL HISTORY: Darrell Johnson  reports that Darrell Johnson quit smoking about 2 months ago. Darrell Johnson started smoking about 5 months ago. Darrell Johnson has a 8.00 pack-year smoking history. Darrell Johnson has never used smokeless tobacco. Darrell Johnson reports that Darrell Johnson does not drink alcohol or use drugs.  REVIEW OF SYSTEMS:   Unable to obtain 2/2 intubation  VITAL SIGNS: BP 183/100   Pulse 85   Resp 20   SpO2 100%   HEMODYNAMICS:    VENTILATOR SETTINGS: Vent Mode: PRVC FiO2 (%):  [100 %] 100 % Set Rate:  [25 bmp] 25 bmp Vt Set:  [530 mL] 530 mL PEEP:  [5 cmH20] 5 cmH20 Plateau Pressure:  [29 cmH20] 29 cmH20  INTAKE / OUTPUT: No intake/output data recorded.  PHYSICAL EXAMINATION: General:  Middle aged man, intubated and sedated Neuro:  Non-responsive HEENT:  MMM Cardiovascular:  Tachycardic Lungs:  Bilateral crackles Abdomen:  Obese Musculoskeletal: R forearm  Skin:  No rashes on visible skin  LABS:  BMET  Recent Labs Lab 06/16/16 0404 06/16/16 0412  NA 140 138  K 6.3* 6.6*  CL 105 100*  CO2  --  23  BUN 67* 61*  CREATININE 12.90* 12.54*  GLUCOSE 101* 102*    Electrolytes  Recent Labs Lab 06/16/16 0412  CALCIUM 9.0    CBC  Recent Labs Lab 06/16/16 0404 06/16/16 0412  WBC  --  19.4*  HGB 10.5* 9.2*  HCT 31.0* 29.1*  PLT  --  214    Coag's No results for input(s): APTT, INR in the last 168 hours.  Sepsis Markers  Recent Labs Lab 06/16/16 0403  LATICACIDVEN 1.29    ABG  Recent Labs Lab 06/16/16 0437  PHART 7.082*  PCO2ART 98.6*   PO2ART 189.0*    Liver Enzymes No results for input(s): AST, ALT, ALKPHOS, BILITOT, ALBUMIN in the last 168 hours.  Cardiac Enzymes No results for input(s): TROPONINI, PROBNP in the last 168 hours.  Glucose No results for input(s): GLUCAP in the last 168 hours.  Imaging Dg Chest Portable 1 View  Result Date: 06/16/2016 CLINICAL DATA:  50 y/o M; missed dialysis appointment. Shortness of breath. EXAM: PORTABLE CHEST 1 VIEW COMPARISON:  03/31/2016 chest radiograph FINDINGS: Cardiomegaly. Diffuse patchy opacification of the lungs probably represents moderate pulmonary edema. Small right pleural effusion and basilar atelectasis. Endotracheal tube approximately 2.5 cm from the carina. Chronic right lateral rib fractures. IMPRESSION: Moderate pulmonary edema, small right pleural effusion, cardiomegaly. Endotracheal tube approximately 2.5 cm from carina. Electronically Signed   By: Mia Creek  [place] M.D.   On: 06/16/2016 04:28   STUDIES:  CXR  CULTURES: None  ANTIBIOTICS: None  SIGNIFICANT EVENTS: Intubated in the ED  LINES/TUBES: PIV (R forearm fistula)  ASSESSMENT / PLAN:  PULMONARY A: Hypoxic Respiratory Failure Possible COPD Pulmonary Edema P:   Full vent support Re-address following dialysis Needs outpt PFTs after discharge  CARDIOVASCULAR A:  Hypertensive urgency P:  Resume home meds, also needs dialysis  RENAL A:   ESRD Hyperkalemia P:   Needs dialysis ASAP today  GASTROINTESTINAL A:   No active issues P:    HEMATOLOGIC A:   Leukocytosis Anemia P:  Due to chronic dz and renal dz Will monitor  INFECTIOUS A:   Hx of HIV P:   Resume home meds  ENDOCRINE A:    No active issues P:    NEUROLOGIC A:   Metabolic Encephalopathy  P:   Will treat underlying causes  RASS goal: 0   FAMILY  - Inter-disciplinary family meet or Palliative Care meeting due by:  12/4  CRITICAL CARE Performed by: Luz Brazen   Total critical  care time: 45 minutes  Critical care time was exclusive of separately billable procedures and treating other patients.  Critical care was necessary to treat or prevent imminent or life-threatening deterioration.  Critical care was time spent personally by me on the following activities: development of treatment plan with patient and/or surrogate as well as nursing, discussions with consultants, evaluation of patient's response to treatment, examination of patient, obtaining history from patient or surrogate, ordering and performing treatments and interventions, ordering and review of laboratory studies, ordering and review of radiographic studies, pulse oximetry and re-evaluation of patient's condition.  Luz Brazen, MD Pulmonary and [place] Pager: [phone]  06/16/2016, 5:06 AM

## 2016-06-17 DIAGNOSIS — Z992 Dependence on renal dialysis: Secondary | ICD-10-CM

## 2016-06-17 DIAGNOSIS — J9601 Acute respiratory failure with hypoxia: Principal | ICD-10-CM

## 2016-06-17 DIAGNOSIS — I161 Hypertensive emergency: Secondary | ICD-10-CM

## 2016-06-17 DIAGNOSIS — N186 End stage renal disease: Secondary | ICD-10-CM

## 2016-06-17 LAB — RENAL FUNCTION PANEL
ALBUMIN: 3.4 g/dL — AB (ref 3.5–5.0)
ANION GAP: 13 (ref 5–15)
BUN: 36 mg/dL — AB (ref 6–20)
CO2: 28 mmol/L (ref 22–32)
Calcium: 8.8 mg/dL — ABNORMAL LOW (ref 8.9–10.3)
Chloride: 93 mmol/L — ABNORMAL LOW (ref 101–111)
Creatinine, Ser: 8.65 mg/dL — ABNORMAL HIGH (ref 0.61–1.24)
GFR calc Af Amer: 7 mL/min — ABNORMAL LOW (ref >60)
GFR calc non Af Amer: 6 mL/min — ABNORMAL LOW (ref >60)
GLUCOSE: 80 mg/dL (ref 65–99)
PHOSPHORUS: 5.7 mg/dL — AB (ref 2.5–4.6)
POTASSIUM: 4 mmol/L (ref 3.5–5.1)
SODIUM: 134 mmol/L — AB (ref 135–145)

## 2016-06-17 LAB — CBC
HEMATOCRIT: 25.1 % — AB (ref 39.0–52.0)
HEMOGLOBIN: 8.2 g/dL — AB (ref 13.0–17.0)
MCH: 29.1 pg (ref 26.0–34.0)
MCHC: 32.7 g/dL (ref 30.0–36.0)
MCV: 89 fL (ref 78.0–100.0)
Platelets: 137 10*3/uL — ABNORMAL LOW (ref 150–400)
RBC: 2.82 MIL/uL — ABNORMAL LOW (ref 4.22–5.81)
RDW: 17.9 % — AB (ref 11.5–15.5)
WBC: 8.1 10*3/uL (ref 4.0–10.5)

## 2016-06-17 LAB — HEPATITIS B SURFACE ANTIGEN: Hepatitis B Surface Ag: NEGATIVE

## 2016-06-17 MED ORDER — LIDOCAINE-PRILOCAINE 2.5-2.5 % EX CREA: 1.0000 "application " | TOPICAL_CREAM | CUTANEOUS | Status: DC | PRN

## 2016-06-17 MED ORDER — SODIUM CHLORIDE 0.9 % IV SOLN: 100.0000 mL | INTRAVENOUS | Status: DC | PRN

## 2016-06-17 MED ORDER — LIDOCAINE HCL (PF) 1 % IJ SOLN: 5.0000 mL | INTRAMUSCULAR | Status: DC | PRN

## 2016-06-17 MED ORDER — HEPARIN SODIUM (PORCINE) 1000 UNIT/ML DIALYSIS: 4000.0000 [IU] | Freq: Once | INTRAMUSCULAR | Status: DC

## 2016-06-17 MED ORDER — GUAIFENESIN-DM 100-10 MG/5ML PO SYRP: 5.0000 mL | ORAL_SOLUTION | ORAL | 0 refills | Status: DC | PRN

## 2016-06-17 MED ORDER — HYDROCODONE-ACETAMINOPHEN 5-325 MG PO TABS
1.0000 | ORAL_TABLET | Freq: Four times a day (QID) | ORAL | Status: DC | PRN
  Administered 2016-06-17: 1 via ORAL
  Filled 2016-06-17: qty 1

## 2016-06-17 MED ORDER — HEPARIN SODIUM (PORCINE) 1000 UNIT/ML DIALYSIS: 1000.0000 [IU] | INTRAMUSCULAR | Status: DC | PRN

## 2016-06-17 MED ORDER — AZITHROMYCIN 250 MG PO TABS: ORAL_TABLET | ORAL | 0 refills | Status: DC

## 2016-06-17 MED ORDER — PENTAFLUOROPROP-TETRAFLUOROETH EX AERO: 1.0000 "application " | INHALATION_SPRAY | CUTANEOUS | Status: DC | PRN

## 2016-06-17 NOTE — Progress Notes (Signed)
  [facility] Progress Note   Subjective: feels a lot better, SOB much better. No c/o today  Vitals:   06/17/16 0700 06/17/16 0800 06/17/16 0818 06/17/16 1024  BP: (!) 144/70 (!) 161/77  98/64  Pulse:  80  82  Resp: 20 17  (!) 21  Temp:   98.4 F (36.9 C)   TempSrc:   Oral   SpO2:  96%  96%  Weight:        Inpatient medications: . amLODipine  10 mg Oral QHS  . carvedilol  25 mg Oral BID WC  . dolutegravir  50 mg Oral Daily  . doxazosin  8 mg Oral Daily  . famotidine  20 mg Oral Daily  . heparin  5,000 Units Subcutaneous Q8H  . hydrALAZINE  10 mg Oral TID  . mouth rinse  15 mL Mouth Rinse BID  . pravastatin  40 mg Oral QPM  . rilpivirine  25 mg Oral Q breakfast  . tenofovir  300 mg Oral Q Sun   . propofol (DIPRIVAN) infusion Stopped (06/16/16 1500)   sodium chloride, sodium chloride, sodium chloride, acetaminophen, albuterol, alteplase, heparin, heparin, HYDROcodone-acetaminophen, lidocaine (PF), lidocaine-prilocaine, pentafluoroprop-tetrafluoroeth  Exam: Alert, no distress Slight jvd Chest rare crackle R base, L clear RRR  Abd ntnd +bs No LE edema RUA AVF+bruit NF, ox 3, alert  Dialysis: [facility]  4h 2min   105kg   2/2.5 bath  Hep 6000   R arm AVF Hect 4 ug Mircera 11/15      Assessment: 1. Dyspnea/ pulm edema/ vol overload - improved, still up 3-4kg today 2. ESRD usual HD MWF, noncompliant 3. HTN on 4 bp meds, BP's soft this am 4. Anemia of CKD - no chg 5. 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 Hyperkalemia - resolved  Plan - short HD today, get to dry wt.  OK for dc after HD today.    Kelly Splinter MD [facility] pager [phone]   06/17/2016, 11:33 AM    Recent Labs Lab 06/16/16 0404 06/16/16 0412 06/17/16 0232  NA 140 138 134*  K 6.3* 6.6* 4.0  CL 105 100* 93*  CO2  --  23 28  GLUCOSE 101* 102* 80  BUN 67* 61* 36*  CREATININE 12.90* 12.54* 8.65*  CALCIUM  --  9.0 8.8*  PHOS  --  6.4* 5.7*    Recent Labs Lab 06/17/16 0232   ALBUMIN 3.4*    Recent Labs Lab 06/16/16 0404 06/16/16 0412 06/17/16 0232  WBC  --  19.4* 8.1  NEUTROABS  --  15.1*  --   HGB 10.5* 9.2* 8.2*  HCT 31.0* 29.1* 25.1*  MCV  --  90.9 89.0  PLT  --  214 137*   Iron/TIBC/Ferritin/ %Sat    Component Value Date/Time   IRON 20 (L) 09/28/2014 1015   TIBC 243 09/28/2014 1015   FERRITIN 590 (H) 09/28/2014 1015   IRONPCTSAT 8 (L) 09/28/2014 1015

## 2016-06-17 NOTE — Progress Notes (Signed)
Discharge paperwork gone over with patient and son. All questions answered to patients satisfaction. Pt refused PM meds. IV removed intact, tele discontinued. Pt discharged to home with family by way of wheelchair.  Manus Gunning, RN

## 2016-06-17 NOTE — Discharge Summary (Signed)
Physician Discharge Summary  Patient ID: Darrell Johnson MRN: [id number] DOB/AGE: 03/23/1966 50 y.o.  Admit date: 06/16/2016 Discharge date: 06/17/2016    Discharge Diagnoses:  Acute Hypoxic Respiratory Failure secondary to Volume Overload, Hypertensive Emergency  Pulmonary Edema  COPD without acute exacerbation  Hypertensive Urgency  ESRD Medical Non-Compliance Hyperkalemia  Leukocytosis  Anemia  HIV Metabolic Encephalopathy in the setting of Hypertensive Urgency, ESRD Sinusitis                                   DISCHARGE PLAN BY DIAGNOSIS     Acute Hypoxic Respiratory Failure secondary to Volume Overload, Hypertensive Emergency  Pulmonary Edema  COPD without acute exacerbation   Discharge Plan: Resolved with volume removal  Encouraged medical compliance   Hypertensive Urgency   Discharge Plan: Continue norvasc, coreg, hydralazine  ESRD Medical Non-Compliance Hyperkalemia   Discharge Plan: Follow up with HD as previously scheduled  Encouraged compliance with HD, BP regimen  Follow up electrolytes with Renal PRN   Leukocytosis  Anemia   Discharge Plan: Follow up CBC with renal PRN   Sinusitis  Discharge plan Azithromycin (ZPAK) Robitussin DM  HIV  Discharge Plan: Continue dolutegravir, rilpivirine, tenofovir   Metabolic Encephalopathy in the setting of Hypertensive Urgency, ESRD  Discharge Plan: Resolved, no acute follow up at this time.                   DISCHARGE SUMMARY   Darrell Johnson is a 50 y.o. y/o male with a PMH of HIV and ESRD on HD who was brought to the ED 11/27 by EMS for dyspnea, hypoxia and AMS. A family member called EMS and on arrival, he was agitated and altered and refused to cooperate. He was hypoxic, and had to be treated with IM versed to be transported. He was intubated in the ED. Per report, he apparently missed dialysis on Friday (after Thanksgiving), and has been more dyspneic over the Thanksgiving long  weekend.  The patient was admitted for control of BP and volume removal via HD.  Nephrology was consulted to assist with HD.  The patient met extubation criteria on the afternoon of 11/27 and was successfully extubated.  He underwent additional HD on 11/28 and was medically cleared for discharge with plans as above.              SIGNIFICANT DIAGNOSTIC STUDIES 11/27  CXR >> moderated pulmonary edema, small R effusion, cardiomegaly   CONSULTS Nephrology   TUBES / LINES ETT 11/27 >> 11/27   Discharge Exam: General: awake alert. No distress  Neuro: awake alert, no focal def  CV: rrr, no MRG  PULM: clear and w/out accessory use  GI: soft and not tender  Extremities: warm and dry   Vitals:   06/17/16 1358 06/17/16 1430 06/17/16 1452 06/17/16 1530  BP: (!) 148/84 (!) 152/66 138/83 (!) 142/70  Pulse: 73 88 88 85  Resp: 15 16 17 16  Temp:      TempSrc:      SpO2:      Weight:         Discharge Labs  BMET  Recent Labs Lab 06/16/16 0404 06/16/16 0412 06/17/16 0232  NA 140 138 134*  K 6.3* 6.6* 4.0  CL 105 100* 93*  CO2  --  23 28  GLUCOSE 101* 102* 80  BUN 67* 61* 36*  CREATININE 12.90* 12.54* 8.65*  CALCIUM  --    9.0 8.8*  MG  --  2.1  --   PHOS  --  6.4* 5.7*    CBC  Recent Labs Lab 06/16/16 0404 06/16/16 0412 06/17/16 0232  HGB 10.5* 9.2* 8.2*  HCT 31.0* 29.1* 25.1*  WBC  --  19.4* 8.1  PLT  --  214 137*   Discharge Instructions    DME Nebulizer machine    Complete by:  As directed    Patient needs a nebulizer to treat with the following condition:  Wheezing   Diet - low sodium heart healthy    Complete by:  As directed    Increase activity slowly    Complete by:  As directed             Medication List    STOP taking these medications   ALKA-SELTZER PLUS COLD PO   guaiFENesin 100 MG/5ML Soln Commonly known as:  ROBITUSSIN   guaiFENesin 600 MG 12 hr tablet Commonly known as:  MUCINEX     TAKE these medications   albuterol 108 (90  Base) MCG/ACT inhaler Commonly known as:  PROVENTIL HFA;VENTOLIN HFA Inhale 2 puffs into the lungs every 6 (six) hours as needed for wheezing or shortness of breath. What changed:  Another medication with the same name was removed. Continue taking this medication, and follow the directions you see here.   albuterol (2.5 MG/3ML) 0.083% nebulizer solution Commonly known as:  PROVENTIL Take 3 mLs (2.5 mg total) by nebulization every 6 (six) hours as needed for wheezing or shortness of breath. What changed:  Another medication with the same name was removed. Continue taking this medication, and follow the directions you see here.   amLODipine 5 MG tablet Commonly known as:  NORVASC Take 2 tablets (10 mg total) by mouth daily. What changed:  how much to take  when to take this   azithromycin 250 MG tablet Commonly known as:  ZITHROMAX Take 2 tabs on day 1, then the next day start 1 tab daily until gone.   buPROPion 75 MG tablet Commonly known as:  WELLBUTRIN Take 0.5 tablets (37.5 mg total) by mouth 2 (two) times daily.   carvedilol 25 MG tablet Commonly known as:  COREG TAKE 1 TABLET (25 MG TOTAL) BY MOUTH 2 (TWO) TIMES DAILY WITH A MEAL.   citalopram 10 MG tablet Commonly known as:  CELEXA Take 10 mg by mouth 2 (two) times daily.   colchicine 0.6 MG tablet 1 capsule PO, 3 times per week after HD What changed:  how much to take  how to take this  when to take this  reasons to take this  additional instructions   cyclobenzaprine 10 MG tablet Commonly known as:  FLEXERIL Take 1 tablet (10 mg total) by mouth 2 (two) times daily as needed for muscle spasms. What changed:  when to take this   dolutegravir 50 MG tablet Commonly known as:  TIVICAY Take 1 tablet (50 mg total) by mouth daily.   doxazosin 8 MG tablet Commonly known as:  CARDURA TAKE 1 TABLET (8 MG TOTAL) BY MOUTH AT BEDTIME.   furosemide 40 MG tablet Commonly known as:  LASIX TAKE 1 TABLET (40 MG  TOTAL) BY MOUTH DAILY.   guaiFENesin-dextromethorphan 100-10 MG/5ML syrup Commonly known as:  ROBITUSSIN DM Take 5 mLs by mouth every 4 (four) hours as needed for cough.   hydrALAZINE 10 MG tablet Commonly known as:  APRESOLINE Take 1 tablet (10 mg total) by mouth 3 (three) times  daily.   HYDROcodone-acetaminophen 5-325 MG tablet Commonly known as:  NORCO/VICODIN Take 1-2 tablets by mouth every 4 (four) hours as needed for moderate pain or severe pain.   methocarbamol 500 MG tablet Commonly known as:  ROBAXIN Take 1 tablet (500 mg total) by mouth 2 (two) times daily.   pravastatin 40 MG tablet Commonly known as:  PRAVACHOL Take 1 tablet (40 mg total) by mouth every evening.   rilpivirine 25 MG Tabs tablet Commonly known as:  EDURANT Take 1 tablet (25 mg total) by mouth daily with breakfast. What changed:  when to take this  additional instructions   SALONPAS EX Apply 1 patch topically daily as needed (pain).   sevelamer carbonate 800 MG tablet Commonly known as:  RENVELA Take 2,400 mg by mouth See admin instructions. Take 3 tablets (2400 mg) by mouth with meals and snacks   tenofovir 300 MG tablet Commonly known as:  VIREAD TAKE 1 TABLET BY MOUTH ONCE A WEEK What changed:  how much to take  how to take this  when to take this  additional instructions   ULORIC 80 MG Tabs Generic drug:  Febuxostat Take 80 mg by mouth daily as needed (gout).            Durable Medical Equipment        Start     Ordered   06/17/16 0000  DME Nebulizer machine    Question:  Patient needs a nebulizer to treat with the following condition  Answer:  Wheezing   06/17/16 1536        Disposition: Home.  No new home health needs identified.    Discharged Condition: Darrell Johnson has met maximum benefit of inpatient care and is medically stable and cleared for discharge.  Patient is pending follow up as above.      Time spent on disposition:  Greater than 35 minutes.    Signed: Noe Gens, NP-C [facility] Pgr: [phone] Office: [phone]

## 2016-06-18 DIAGNOSIS — D631 Anemia in chronic kidney disease: Secondary | ICD-10-CM | POA: Diagnosis not present

## 2016-06-18 DIAGNOSIS — N2581 Secondary hyperparathyroidism of renal origin: Secondary | ICD-10-CM | POA: Diagnosis not present

## 2016-06-18 DIAGNOSIS — D509 Iron deficiency anemia, unspecified: Secondary | ICD-10-CM | POA: Diagnosis not present

## 2016-06-18 DIAGNOSIS — N186 End stage renal disease: Secondary | ICD-10-CM | POA: Diagnosis not present

## 2016-06-19 DIAGNOSIS — N186 End stage renal disease: Secondary | ICD-10-CM | POA: Diagnosis not present

## 2016-06-19 DIAGNOSIS — Z992 Dependence on renal dialysis: Secondary | ICD-10-CM | POA: Diagnosis not present

## 2016-06-19 DIAGNOSIS — I120 Hypertensive chronic kidney disease with stage 5 chronic kidney disease or end stage renal disease: Secondary | ICD-10-CM | POA: Diagnosis not present

## 2016-06-20 DIAGNOSIS — D631 Anemia in chronic kidney disease: Secondary | ICD-10-CM | POA: Diagnosis not present

## 2016-06-20 DIAGNOSIS — N186 End stage renal disease: Secondary | ICD-10-CM | POA: Diagnosis not present

## 2016-06-20 DIAGNOSIS — N2581 Secondary hyperparathyroidism of renal origin: Secondary | ICD-10-CM | POA: Diagnosis not present

## 2016-06-23 DIAGNOSIS — N186 End stage renal disease: Secondary | ICD-10-CM | POA: Diagnosis not present

## 2016-06-23 DIAGNOSIS — N2581 Secondary hyperparathyroidism of renal origin: Secondary | ICD-10-CM | POA: Diagnosis not present

## 2016-06-23 DIAGNOSIS — D631 Anemia in chronic kidney disease: Secondary | ICD-10-CM | POA: Diagnosis not present

## 2016-06-25 DIAGNOSIS — N186 End stage renal disease: Secondary | ICD-10-CM | POA: Diagnosis not present

## 2016-06-25 DIAGNOSIS — N2581 Secondary hyperparathyroidism of renal origin: Secondary | ICD-10-CM | POA: Diagnosis not present

## 2016-06-25 DIAGNOSIS — D631 Anemia in chronic kidney disease: Secondary | ICD-10-CM | POA: Diagnosis not present

## 2016-06-27 DIAGNOSIS — N186 End stage renal disease: Secondary | ICD-10-CM | POA: Diagnosis not present

## 2016-06-27 DIAGNOSIS — N2581 Secondary hyperparathyroidism of renal origin: Secondary | ICD-10-CM | POA: Diagnosis not present

## 2016-06-27 DIAGNOSIS — D631 Anemia in chronic kidney disease: Secondary | ICD-10-CM | POA: Diagnosis not present

## 2016-06-30 DIAGNOSIS — N186 End stage renal disease: Secondary | ICD-10-CM | POA: Diagnosis not present

## 2016-06-30 DIAGNOSIS — D631 Anemia in chronic kidney disease: Secondary | ICD-10-CM | POA: Diagnosis not present

## 2016-06-30 DIAGNOSIS — N2581 Secondary hyperparathyroidism of renal origin: Secondary | ICD-10-CM | POA: Diagnosis not present

## 2016-07-02 DIAGNOSIS — D631 Anemia in chronic kidney disease: Secondary | ICD-10-CM | POA: Diagnosis not present

## 2016-07-02 DIAGNOSIS — N186 End stage renal disease: Secondary | ICD-10-CM | POA: Diagnosis not present

## 2016-07-02 DIAGNOSIS — N2581 Secondary hyperparathyroidism of renal origin: Secondary | ICD-10-CM | POA: Diagnosis not present

## 2016-07-03 ENCOUNTER — Encounter: Payer: Self-pay | Admitting: Internal Medicine

## 2016-07-04 DIAGNOSIS — D631 Anemia in chronic kidney disease: Secondary | ICD-10-CM | POA: Diagnosis not present

## 2016-07-04 DIAGNOSIS — N186 End stage renal disease: Secondary | ICD-10-CM | POA: Diagnosis not present

## 2016-07-04 DIAGNOSIS — N2581 Secondary hyperparathyroidism of renal origin: Secondary | ICD-10-CM | POA: Diagnosis not present

## 2016-07-07 ENCOUNTER — Other Ambulatory Visit: Payer: Self-pay | Admitting: Internal Medicine

## 2016-07-07 DIAGNOSIS — D631 Anemia in chronic kidney disease: Secondary | ICD-10-CM | POA: Diagnosis not present

## 2016-07-07 DIAGNOSIS — N186 End stage renal disease: Secondary | ICD-10-CM | POA: Diagnosis not present

## 2016-07-07 DIAGNOSIS — N2581 Secondary hyperparathyroidism of renal origin: Secondary | ICD-10-CM | POA: Diagnosis not present

## 2016-07-09 DIAGNOSIS — D631 Anemia in chronic kidney disease: Secondary | ICD-10-CM | POA: Diagnosis not present

## 2016-07-09 DIAGNOSIS — N186 End stage renal disease: Secondary | ICD-10-CM | POA: Diagnosis not present

## 2016-07-09 DIAGNOSIS — N2581 Secondary hyperparathyroidism of renal origin: Secondary | ICD-10-CM | POA: Diagnosis not present

## 2016-07-11 DIAGNOSIS — D631 Anemia in chronic kidney disease: Secondary | ICD-10-CM | POA: Diagnosis not present

## 2016-07-11 DIAGNOSIS — N2581 Secondary hyperparathyroidism of renal origin: Secondary | ICD-10-CM | POA: Diagnosis not present

## 2016-07-11 DIAGNOSIS — N186 End stage renal disease: Secondary | ICD-10-CM | POA: Diagnosis not present

## 2016-07-13 DIAGNOSIS — N186 End stage renal disease: Secondary | ICD-10-CM | POA: Diagnosis not present

## 2016-07-13 DIAGNOSIS — D631 Anemia in chronic kidney disease: Secondary | ICD-10-CM | POA: Diagnosis not present

## 2016-07-13 DIAGNOSIS — N2581 Secondary hyperparathyroidism of renal origin: Secondary | ICD-10-CM | POA: Diagnosis not present

## 2016-07-16 DIAGNOSIS — N186 End stage renal disease: Secondary | ICD-10-CM | POA: Diagnosis not present

## 2016-07-16 DIAGNOSIS — D631 Anemia in chronic kidney disease: Secondary | ICD-10-CM | POA: Diagnosis not present

## 2016-07-16 DIAGNOSIS — N2581 Secondary hyperparathyroidism of renal origin: Secondary | ICD-10-CM | POA: Diagnosis not present

## 2016-07-18 DIAGNOSIS — N186 End stage renal disease: Secondary | ICD-10-CM | POA: Diagnosis not present

## 2016-07-18 DIAGNOSIS — D631 Anemia in chronic kidney disease: Secondary | ICD-10-CM | POA: Diagnosis not present

## 2016-07-18 DIAGNOSIS — N2581 Secondary hyperparathyroidism of renal origin: Secondary | ICD-10-CM | POA: Diagnosis not present

## 2016-07-20 DIAGNOSIS — Z992 Dependence on renal dialysis: Secondary | ICD-10-CM | POA: Diagnosis not present

## 2016-07-20 DIAGNOSIS — D631 Anemia in chronic kidney disease: Secondary | ICD-10-CM | POA: Diagnosis not present

## 2016-07-20 DIAGNOSIS — I120 Hypertensive chronic kidney disease with stage 5 chronic kidney disease or end stage renal disease: Secondary | ICD-10-CM | POA: Diagnosis not present

## 2016-07-20 DIAGNOSIS — N186 End stage renal disease: Secondary | ICD-10-CM | POA: Diagnosis not present

## 2016-07-20 DIAGNOSIS — N2581 Secondary hyperparathyroidism of renal origin: Secondary | ICD-10-CM | POA: Diagnosis not present

## 2016-07-23 DIAGNOSIS — N186 End stage renal disease: Secondary | ICD-10-CM | POA: Diagnosis not present

## 2016-07-23 DIAGNOSIS — N2581 Secondary hyperparathyroidism of renal origin: Secondary | ICD-10-CM | POA: Diagnosis not present

## 2016-07-23 DIAGNOSIS — D631 Anemia in chronic kidney disease: Secondary | ICD-10-CM | POA: Diagnosis not present

## 2016-07-23 DIAGNOSIS — D509 Iron deficiency anemia, unspecified: Secondary | ICD-10-CM | POA: Diagnosis not present

## 2016-07-25 DIAGNOSIS — N2581 Secondary hyperparathyroidism of renal origin: Secondary | ICD-10-CM | POA: Diagnosis not present

## 2016-07-25 DIAGNOSIS — D509 Iron deficiency anemia, unspecified: Secondary | ICD-10-CM | POA: Diagnosis not present

## 2016-07-25 DIAGNOSIS — D631 Anemia in chronic kidney disease: Secondary | ICD-10-CM | POA: Diagnosis not present

## 2016-07-25 DIAGNOSIS — N186 End stage renal disease: Secondary | ICD-10-CM | POA: Diagnosis not present

## 2016-07-28 DIAGNOSIS — D631 Anemia in chronic kidney disease: Secondary | ICD-10-CM | POA: Diagnosis not present

## 2016-07-28 DIAGNOSIS — N2581 Secondary hyperparathyroidism of renal origin: Secondary | ICD-10-CM | POA: Diagnosis not present

## 2016-07-28 DIAGNOSIS — N186 End stage renal disease: Secondary | ICD-10-CM | POA: Diagnosis not present

## 2016-07-28 DIAGNOSIS — D509 Iron deficiency anemia, unspecified: Secondary | ICD-10-CM | POA: Diagnosis not present

## 2016-07-29 ENCOUNTER — Other Ambulatory Visit: Payer: Self-pay | Admitting: Internal Medicine

## 2016-07-30 DIAGNOSIS — D509 Iron deficiency anemia, unspecified: Secondary | ICD-10-CM | POA: Diagnosis not present

## 2016-07-30 DIAGNOSIS — D631 Anemia in chronic kidney disease: Secondary | ICD-10-CM | POA: Diagnosis not present

## 2016-07-30 DIAGNOSIS — N2581 Secondary hyperparathyroidism of renal origin: Secondary | ICD-10-CM | POA: Diagnosis not present

## 2016-07-30 DIAGNOSIS — N186 End stage renal disease: Secondary | ICD-10-CM | POA: Diagnosis not present

## 2016-08-01 DIAGNOSIS — N2581 Secondary hyperparathyroidism of renal origin: Secondary | ICD-10-CM | POA: Diagnosis not present

## 2016-08-01 DIAGNOSIS — D509 Iron deficiency anemia, unspecified: Secondary | ICD-10-CM | POA: Diagnosis not present

## 2016-08-01 DIAGNOSIS — D631 Anemia in chronic kidney disease: Secondary | ICD-10-CM | POA: Diagnosis not present

## 2016-08-01 DIAGNOSIS — N186 End stage renal disease: Secondary | ICD-10-CM | POA: Diagnosis not present

## 2016-08-04 DIAGNOSIS — N2581 Secondary hyperparathyroidism of renal origin: Secondary | ICD-10-CM | POA: Diagnosis not present

## 2016-08-04 DIAGNOSIS — D509 Iron deficiency anemia, unspecified: Secondary | ICD-10-CM | POA: Diagnosis not present

## 2016-08-04 DIAGNOSIS — D631 Anemia in chronic kidney disease: Secondary | ICD-10-CM | POA: Diagnosis not present

## 2016-08-04 DIAGNOSIS — N186 End stage renal disease: Secondary | ICD-10-CM | POA: Diagnosis not present

## 2016-08-06 DIAGNOSIS — N2581 Secondary hyperparathyroidism of renal origin: Secondary | ICD-10-CM | POA: Diagnosis not present

## 2016-08-06 DIAGNOSIS — N186 End stage renal disease: Secondary | ICD-10-CM | POA: Diagnosis not present

## 2016-08-06 DIAGNOSIS — D509 Iron deficiency anemia, unspecified: Secondary | ICD-10-CM | POA: Diagnosis not present

## 2016-08-06 DIAGNOSIS — D631 Anemia in chronic kidney disease: Secondary | ICD-10-CM | POA: Diagnosis not present

## 2016-08-11 DIAGNOSIS — D509 Iron deficiency anemia, unspecified: Secondary | ICD-10-CM | POA: Diagnosis not present

## 2016-08-11 DIAGNOSIS — D631 Anemia in chronic kidney disease: Secondary | ICD-10-CM | POA: Diagnosis not present

## 2016-08-11 DIAGNOSIS — N2581 Secondary hyperparathyroidism of renal origin: Secondary | ICD-10-CM | POA: Diagnosis not present

## 2016-08-11 DIAGNOSIS — N186 End stage renal disease: Secondary | ICD-10-CM | POA: Diagnosis not present

## 2016-08-12 ENCOUNTER — Other Ambulatory Visit: Payer: Self-pay | Admitting: Internal Medicine

## 2016-08-13 DIAGNOSIS — D631 Anemia in chronic kidney disease: Secondary | ICD-10-CM | POA: Diagnosis not present

## 2016-08-13 DIAGNOSIS — D509 Iron deficiency anemia, unspecified: Secondary | ICD-10-CM | POA: Diagnosis not present

## 2016-08-13 DIAGNOSIS — N186 End stage renal disease: Secondary | ICD-10-CM | POA: Diagnosis not present

## 2016-08-13 DIAGNOSIS — N2581 Secondary hyperparathyroidism of renal origin: Secondary | ICD-10-CM | POA: Diagnosis not present

## 2016-08-15 ENCOUNTER — Other Ambulatory Visit: Payer: Self-pay | Admitting: Internal Medicine

## 2016-08-15 DIAGNOSIS — N2581 Secondary hyperparathyroidism of renal origin: Secondary | ICD-10-CM | POA: Diagnosis not present

## 2016-08-15 DIAGNOSIS — D631 Anemia in chronic kidney disease: Secondary | ICD-10-CM | POA: Diagnosis not present

## 2016-08-15 DIAGNOSIS — D509 Iron deficiency anemia, unspecified: Secondary | ICD-10-CM | POA: Diagnosis not present

## 2016-08-15 DIAGNOSIS — F411 Generalized anxiety disorder: Secondary | ICD-10-CM

## 2016-08-15 DIAGNOSIS — N186 End stage renal disease: Secondary | ICD-10-CM | POA: Diagnosis not present

## 2016-08-18 DIAGNOSIS — D509 Iron deficiency anemia, unspecified: Secondary | ICD-10-CM | POA: Diagnosis not present

## 2016-08-18 DIAGNOSIS — N2581 Secondary hyperparathyroidism of renal origin: Secondary | ICD-10-CM | POA: Diagnosis not present

## 2016-08-18 DIAGNOSIS — D631 Anemia in chronic kidney disease: Secondary | ICD-10-CM | POA: Diagnosis not present

## 2016-08-18 DIAGNOSIS — N186 End stage renal disease: Secondary | ICD-10-CM | POA: Diagnosis not present

## 2016-08-20 DIAGNOSIS — N186 End stage renal disease: Secondary | ICD-10-CM | POA: Diagnosis not present

## 2016-08-20 DIAGNOSIS — I120 Hypertensive chronic kidney disease with stage 5 chronic kidney disease or end stage renal disease: Secondary | ICD-10-CM | POA: Diagnosis not present

## 2016-08-20 DIAGNOSIS — D631 Anemia in chronic kidney disease: Secondary | ICD-10-CM | POA: Diagnosis not present

## 2016-08-20 DIAGNOSIS — Z992 Dependence on renal dialysis: Secondary | ICD-10-CM | POA: Diagnosis not present

## 2016-08-20 DIAGNOSIS — D509 Iron deficiency anemia, unspecified: Secondary | ICD-10-CM | POA: Diagnosis not present

## 2016-08-20 DIAGNOSIS — N2581 Secondary hyperparathyroidism of renal origin: Secondary | ICD-10-CM | POA: Diagnosis not present

## 2016-08-22 DIAGNOSIS — N2581 Secondary hyperparathyroidism of renal origin: Secondary | ICD-10-CM | POA: Diagnosis not present

## 2016-08-22 DIAGNOSIS — N186 End stage renal disease: Secondary | ICD-10-CM | POA: Diagnosis not present

## 2016-08-22 DIAGNOSIS — D631 Anemia in chronic kidney disease: Secondary | ICD-10-CM | POA: Diagnosis not present

## 2016-08-25 DIAGNOSIS — N186 End stage renal disease: Secondary | ICD-10-CM | POA: Diagnosis not present

## 2016-08-25 DIAGNOSIS — N2581 Secondary hyperparathyroidism of renal origin: Secondary | ICD-10-CM | POA: Diagnosis not present

## 2016-08-25 DIAGNOSIS — D631 Anemia in chronic kidney disease: Secondary | ICD-10-CM | POA: Diagnosis not present

## 2016-08-27 ENCOUNTER — Other Ambulatory Visit: Payer: Self-pay | Admitting: Internal Medicine

## 2016-08-27 DIAGNOSIS — D631 Anemia in chronic kidney disease: Secondary | ICD-10-CM | POA: Diagnosis not present

## 2016-08-27 DIAGNOSIS — N2581 Secondary hyperparathyroidism of renal origin: Secondary | ICD-10-CM | POA: Diagnosis not present

## 2016-08-27 DIAGNOSIS — N186 End stage renal disease: Secondary | ICD-10-CM | POA: Diagnosis not present

## 2016-08-29 DIAGNOSIS — N2581 Secondary hyperparathyroidism of renal origin: Secondary | ICD-10-CM | POA: Diagnosis not present

## 2016-08-29 DIAGNOSIS — D631 Anemia in chronic kidney disease: Secondary | ICD-10-CM | POA: Diagnosis not present

## 2016-08-29 DIAGNOSIS — N186 End stage renal disease: Secondary | ICD-10-CM | POA: Diagnosis not present

## 2016-09-01 DIAGNOSIS — N2581 Secondary hyperparathyroidism of renal origin: Secondary | ICD-10-CM | POA: Diagnosis not present

## 2016-09-01 DIAGNOSIS — D631 Anemia in chronic kidney disease: Secondary | ICD-10-CM | POA: Diagnosis not present

## 2016-09-01 DIAGNOSIS — N186 End stage renal disease: Secondary | ICD-10-CM | POA: Diagnosis not present

## 2016-09-03 DIAGNOSIS — N186 End stage renal disease: Secondary | ICD-10-CM | POA: Diagnosis not present

## 2016-09-03 DIAGNOSIS — D631 Anemia in chronic kidney disease: Secondary | ICD-10-CM | POA: Diagnosis not present

## 2016-09-03 DIAGNOSIS — N2581 Secondary hyperparathyroidism of renal origin: Secondary | ICD-10-CM | POA: Diagnosis not present

## 2016-09-04 ENCOUNTER — Encounter (INDEPENDENT_AMBULATORY_CARE_PROVIDER_SITE_OTHER): Payer: Self-pay

## 2016-09-04 ENCOUNTER — Ambulatory Visit (INDEPENDENT_AMBULATORY_CARE_PROVIDER_SITE_OTHER): Payer: Medicare Other | Admitting: Internal Medicine

## 2016-09-04 ENCOUNTER — Encounter: Payer: Self-pay | Admitting: Internal Medicine

## 2016-09-04 DIAGNOSIS — I10 Essential (primary) hypertension: Secondary | ICD-10-CM

## 2016-09-04 DIAGNOSIS — B9689 Other specified bacterial agents as the cause of diseases classified elsewhere: Secondary | ICD-10-CM

## 2016-09-04 DIAGNOSIS — F17211 Nicotine dependence, cigarettes, in remission: Secondary | ICD-10-CM | POA: Diagnosis not present

## 2016-09-04 DIAGNOSIS — I120 Hypertensive chronic kidney disease with stage 5 chronic kidney disease or end stage renal disease: Secondary | ICD-10-CM

## 2016-09-04 DIAGNOSIS — Z0000 Encounter for general adult medical examination without abnormal findings: Secondary | ICD-10-CM

## 2016-09-04 DIAGNOSIS — Z992 Dependence on renal dialysis: Secondary | ICD-10-CM | POA: Diagnosis not present

## 2016-09-04 DIAGNOSIS — N186 End stage renal disease: Secondary | ICD-10-CM

## 2016-09-04 DIAGNOSIS — L02214 Cutaneous abscess of groin: Secondary | ICD-10-CM

## 2016-09-04 MED ORDER — ALBUTEROL SULFATE (2.5 MG/3ML) 0.083% IN NEBU: 2.5000 mg | INHALATION_SOLUTION | Freq: Four times a day (QID) | RESPIRATORY_TRACT | 12 refills | Status: DC | PRN

## 2016-09-04 MED ORDER — FUROSEMIDE 40 MG PO TABS: 40.0000 mg | ORAL_TABLET | Freq: Every day | ORAL | 1 refills | Status: DC

## 2016-09-04 MED ORDER — ALBUTEROL SULFATE HFA 108 (90 BASE) MCG/ACT IN AERS: 2.0000 | INHALATION_SPRAY | Freq: Four times a day (QID) | RESPIRATORY_TRACT | 2 refills | Status: DC | PRN

## 2016-09-04 NOTE — Assessment & Plan Note (Signed)
A/P: He is up to date with health maintenance.

## 2016-09-04 NOTE — Assessment & Plan Note (Signed)
BP Readings from Last 3 Encounters:  09/04/16 (!) 191/87  06/17/16 126/78  06/10/16 169/86   A: BP is more elevated today than previous visit although he has had consistently high BP at outpatient visits. I was able to review records of his BP during recent dialysis sessions and his BP was more controlled during HD.  He reports no recent medication changes.  His BP today is likely more elevated secondary to the pain he is experiencing from his abscess.  P: - continue amlodipine 10mg  daily, carvedilol 25 mg BID, Cardura 8mg  daily, and Lasix 40mg  daily on non-HD days, and hydralazine 10mg  TID - f/u in 1 week to further discuss titrating BP meds - will also ask our clinic pharamacist to look into this refill history to ensure adherence.

## 2016-09-04 NOTE — Progress Notes (Signed)
CC: right inguinal abscess  HPI:  Darrell Johnson is a 51 y.o. man with a past medical history listed below here today for a right groin abscess.  Symptoms began approximately 1 week ago with pain and swelling.  He describes pain as throbbing and aching.  It is very tender to the touch.  He has had no fevers, chills, nausea, or vomiting.  He reports the abscess is getting bigger and becoming more painful.  His wife attempted to mash on it earlier in the week but this only caused significant pain and a minimal amount of bloody drainage.  He has also attempted to use warm water without much relief but no warm compresses.  He was offered I&D in our clinic versus being seen tomorrow by general surgery and prefers to have I&D attempted in our office initially.   For details of today's visit and the status of his chronic medical issues please refer to the assessment and plan.   Past Medical History:  Diagnosis Date  . Anemia   . Arthritis   . CHF (congestive heart failure) ([place])   . Chronic kidney disease    stage 3-4 CKD, followed by Dr. Moshe Cipro  . ESRD (end stage renal disease) ([place])    ESRD due to HTN started dialysis Feb 2016  . Gout   . Gout, unspecified 08/13/2009   Qualifier: Diagnosis of  By: Redmond Pulling  MD, Mateo Flow    . H1N1 influenza    March 2016  . History of gout   . History of syphilis   . HIV (human immunodeficiency virus infection) ([place])   . HIV infection ([place]) 1980's   on ART therapy since, followed by ID clinic, complicated  by neuropathy  . HTN (hypertension)   . Hyperlipidemia    hypertrygliceridemia determined ti be secondary to ART therpay  . Hypertension   . HYPERTENSION 05/08/2006  . 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 Pneumonia   . Rib fractures 01/2009  . Seizure disorder ([place])   . Seizures ([place])    last seizure was >5 years ago, pt has family history of seizures  . 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 Syphilis 1997   history of syphilis 1997  . Tobacco abuse     Review of Systems:  Please see pertinent ROS reviewed in HPI and problem based charting.   Physical Exam:  Vitals:   09/04/16 1532  BP: (!) 191/87  Pulse: 96  Temp: 98.5 F (36.9 C)  TempSrc: Oral  SpO2: 100%  Weight: 242 lb 9.6 oz (110 kg)   Physical Exam  Constitutional: He is oriented to person, place, and time.  Lying on table, not distressed, uncomfortable appearing.  HENT:  Head: Normocephalic and atraumatic.  Eyes: Conjunctivae and EOM are normal.  Genitourinary:  Genitourinary Comments: Large right inguinal abscess without drainage but does appear to have visible head.  Abscess appears to be about 3-4 cm with additional 3 cm of surrounding induration but no obvious warmth or erythema.  Neurological: He is alert and oriented to person, place, and time.     Assessment & Plan:   See Encounters Tab for problem based charting.  Patient seen with Dr. Lynnae January.  Essential hypertension BP Readings from Last 3 Encounters:  09/04/16 (!) 191/87  06/17/16 126/78  06/10/16 169/86   A: BP is  more elevated today than previous visit although he has had consistently high BP at outpatient visits. I was able to review records of his BP during recent dialysis sessions and his BP was more controlled during HD.  He reports no recent medication changes.  His BP today is likely more elevated secondary to the pain he is experiencing from his abscess.  P: - continue amlodipine 10mg  daily, carvedilol 25 mg BID, Cardura 8mg  daily, and Lasix 40mg  daily on non-HD days, and hydralazine 10mg  TID - f/u in 1 week to further discuss titrating BP meds - will also ask our clinic pharamacist to look into this refill history to ensure adherence.  Abscess of groin, right A: Symptoms began approximately 1 week ago with pain and swelling.   He describes pain as throbbing and aching.  It is very tender to the touch.  He has had no fevers, chills, nausea, or vomiting.  He reports the abscess is getting bigger and becoming more painful.  His wife attempted to mash on it earlier in the week but this only caused significant pain and a minimal amount of bloody drainage.  He has also attempted to use warm water without much relief but no warm compresses.  He was offered I&D in our clinic versus being seen tomorrow by general surgery and prefers to have I&D attempted in our office initially.  On exam, he has a large right inguinal abscess without drainage but does appear to have visible head.  Abscess appears to be about 3-4 cm with additional 3 cm of surrounding induration but no obvious warmth or erythema.   P: - I&D attempted in [facility] today with only bloody drainage, no purulence - recommended to attempt warm compresses - recommended to keep appointment with general surgery tomorrow 2/16 at 4:30pm for further management.  Incision and Drainage Procedure Note: Right groin with nodule consistent with abscess identified, area cleaned with Betadine and anesthesized with numbing spray.  A scalpel was then used to make a small incision on surface of nodule with return of bloody material. Patient tolerated procedure well. Care instructions given.  Attending Dr. Lynnae January present during procedure.   End stage renal disease ([place]) A/P: Dialyzes MWF via RUE AV fistula.    Healthcare maintenance A/P: He is up to date with health maintenance.

## 2016-09-04 NOTE — Assessment & Plan Note (Addendum)
A: Symptoms began approximately 1 week ago with pain and swelling.  He describes pain as throbbing and aching.  It is very tender to the touch.  He has had no fevers, chills, nausea, or vomiting.  He reports the abscess is getting bigger and becoming more painful.  His wife attempted to mash on it earlier in the week but this only caused significant pain and a minimal amount of bloody drainage.  He has also attempted to use warm water without much relief but no warm compresses.  He was offered I&D in our clinic versus being seen tomorrow by general surgery and prefers to have I&D attempted in our office initially.  On exam, he has a large right inguinal abscess without drainage but does appear to have visible head.  Abscess appears to be about 3-4 cm with additional 3 cm of surrounding induration but no obvious warmth or erythema.   P: - I&D attempted in [facility] today with only bloody drainage, no purulence - recommended to attempt warm compresses - recommended to keep appointment with general surgery tomorrow 2/16 at 4:30pm for further management.  Incision and Drainage Procedure Note: Right groin with nodule consistent with abscess identified, area cleaned with Betadine and anesthesized with numbing spray.  A scalpel was then used to make a small incision on surface of nodule with return of bloody material. Patient tolerated procedure well. Care instructions given.  Attending Dr. Lynnae January present during procedure.

## 2016-09-04 NOTE — Assessment & Plan Note (Signed)
A/P: Dialyzes MWF via RUE AV fistula.

## 2016-09-05 ENCOUNTER — Other Ambulatory Visit: Payer: Self-pay | Admitting: Pharmacist

## 2016-09-05 DIAGNOSIS — L02214 Cutaneous abscess of groin: Secondary | ICD-10-CM | POA: Diagnosis not present

## 2016-09-05 DIAGNOSIS — I10 Essential (primary) hypertension: Secondary | ICD-10-CM

## 2016-09-05 NOTE — Progress Notes (Signed)
Internal Medicine Clinic Attending  I saw and evaluated the patient.  I personally confirmed the key portions of the history and exam documented by Dr. [name] and I reviewed pertinent patient test results.  The assessment, diagnosis, and plan were formulated together and I agree with the documentation in the resident's note. I was present as Dr [name] performed time out and during the procedure.

## 2016-09-06 DIAGNOSIS — N186 End stage renal disease: Secondary | ICD-10-CM | POA: Diagnosis not present

## 2016-09-06 DIAGNOSIS — N2581 Secondary hyperparathyroidism of renal origin: Secondary | ICD-10-CM | POA: Diagnosis not present

## 2016-09-06 DIAGNOSIS — D631 Anemia in chronic kidney disease: Secondary | ICD-10-CM | POA: Diagnosis not present

## 2016-09-08 ENCOUNTER — Telehealth: Payer: Self-pay

## 2016-09-08 DIAGNOSIS — N186 End stage renal disease: Secondary | ICD-10-CM | POA: Diagnosis not present

## 2016-09-08 DIAGNOSIS — N2581 Secondary hyperparathyroidism of renal origin: Secondary | ICD-10-CM | POA: Diagnosis not present

## 2016-09-08 DIAGNOSIS — D631 Anemia in chronic kidney disease: Secondary | ICD-10-CM | POA: Diagnosis not present

## 2016-09-08 MED ORDER — HYDRALAZINE HCL 10 MG PO TABS: 10.0000 mg | ORAL_TABLET | Freq: Three times a day (TID) | ORAL | 1 refills | Status: DC

## 2016-09-08 MED ORDER — AMLODIPINE BESYLATE 5 MG PO TABS: 10.0000 mg | ORAL_TABLET | Freq: Every day | ORAL | 1 refills | Status: DC

## 2016-09-08 MED ORDER — CARVEDILOL 25 MG PO TABS: ORAL_TABLET | ORAL | 1 refills | Status: DC

## 2016-09-08 NOTE — Telephone Encounter (Signed)
Pt stated he needs refill on Ventolin - informed pt rx was [facility]. [facility] [address] - I called rxs for Ventolin inhaler and Ventolin neb to this pharmacy.

## 2016-09-08 NOTE — Telephone Encounter (Signed)
Thank you for taking care of that. -ANW

## 2016-09-08 NOTE — Telephone Encounter (Signed)
Needs to speak with a nurse regarding meds. Please call back.

## 2016-09-10 DIAGNOSIS — D631 Anemia in chronic kidney disease: Secondary | ICD-10-CM | POA: Diagnosis not present

## 2016-09-10 DIAGNOSIS — N2581 Secondary hyperparathyroidism of renal origin: Secondary | ICD-10-CM | POA: Diagnosis not present

## 2016-09-10 DIAGNOSIS — N186 End stage renal disease: Secondary | ICD-10-CM | POA: Diagnosis not present

## 2016-09-11 ENCOUNTER — Encounter: Payer: Self-pay | Admitting: Internal Medicine

## 2016-09-11 ENCOUNTER — Ambulatory Visit (INDEPENDENT_AMBULATORY_CARE_PROVIDER_SITE_OTHER): Payer: Medicare Other | Admitting: Internal Medicine

## 2016-09-11 DIAGNOSIS — F17211 Nicotine dependence, cigarettes, in remission: Secondary | ICD-10-CM | POA: Diagnosis not present

## 2016-09-11 DIAGNOSIS — M65331 Trigger finger, right middle finger: Secondary | ICD-10-CM | POA: Diagnosis not present

## 2016-09-11 DIAGNOSIS — T82858A Stenosis of vascular prosthetic devices, implants and grafts, initial encounter: Secondary | ICD-10-CM | POA: Diagnosis not present

## 2016-09-11 DIAGNOSIS — Z992 Dependence on renal dialysis: Secondary | ICD-10-CM | POA: Diagnosis not present

## 2016-09-11 DIAGNOSIS — I10 Essential (primary) hypertension: Secondary | ICD-10-CM

## 2016-09-11 DIAGNOSIS — M6530 Trigger finger, unspecified finger: Secondary | ICD-10-CM | POA: Insufficient documentation

## 2016-09-11 DIAGNOSIS — M65341 Trigger finger, right ring finger: Secondary | ICD-10-CM

## 2016-09-11 DIAGNOSIS — L02214 Cutaneous abscess of groin: Secondary | ICD-10-CM

## 2016-09-11 DIAGNOSIS — I871 Compression of vein: Secondary | ICD-10-CM | POA: Diagnosis not present

## 2016-09-11 DIAGNOSIS — N186 End stage renal disease: Secondary | ICD-10-CM | POA: Diagnosis not present

## 2016-09-11 MED ORDER — HYDRALAZINE HCL 10 MG PO TABS: 10.0000 mg | ORAL_TABLET | Freq: Three times a day (TID) | ORAL | 1 refills | Status: DC

## 2016-09-11 MED ORDER — CARVEDILOL 25 MG PO TABS: ORAL_TABLET | ORAL | 1 refills | Status: DC

## 2016-09-11 MED ORDER — AMLODIPINE BESYLATE 5 MG PO TABS: 10.0000 mg | ORAL_TABLET | Freq: Every day | ORAL | 1 refills | Status: DC

## 2016-09-11 MED ORDER — FUROSEMIDE 40 MG PO TABS: 40.0000 mg | ORAL_TABLET | Freq: Every day | ORAL | 1 refills | Status: DC

## 2016-09-11 MED ORDER — DOXAZOSIN MESYLATE 8 MG PO TABS: ORAL_TABLET | ORAL | 1 refills | Status: DC

## 2016-09-11 NOTE — Assessment & Plan Note (Signed)
A: Patient has noticed his right 3rd and 4th digit catching more over past 1-2 months.  Pain is not similar to cramping he previously experienced with HD.  He notices this most with difficulty extending his fingers.  P:  - discussed initial conservative treatment with splinting and pain control with tylenol as needed - discussed further treatment options such as steroid injection vs surgery.  At this time, he was uninterested in either treatment modality.

## 2016-09-11 NOTE — Assessment & Plan Note (Addendum)
BP Readings from Last 3 Encounters:  09/11/16 (!) 166/81  09/04/16 (!) 191/87  06/17/16 126/78   A: BP is not controlled today.  Our clinic pharmacist was able to determine that hydralazine has been inconsistently filled by patient.  He confirms this today and [facility] [address]  He denies any CP, SOB, lightheadedness or headaches.  P:  - continue current medications of amlodipine 10mg  daily, carvedilol 25mg  BID, Cardura 8mg  daily, and Hydralazine 10mg  TID.  He is also on Lasix 40mg  daily.  Refills for all medications were sent to his pharmacy for 90 day supply. - can consider ACE inhibitor if patient unable to tolerate multiple times per day dosing of hydralazine - RTC 1 month to reassess BP

## 2016-09-11 NOTE — Patient Instructions (Signed)
Thank you for coming to see me today. It was a pleasure. Today we talked about:   Blood pressure: - I have refilled all your blood pressure medications and sent them to [facility] [address]  These medications are: doxazosin, amlodipine, Coreg, and Hydralazine.  I have also sent in your fluid pill.  Abscess:  - please call over to the surgery office to schedule follow up for your abscess.  Finish your antibiotics and take pain med only as needed  Trigger finger: - we will try conservative management first.  Use tylenol as needed and try splinting to help.  Try to keep your range of motion.  If needed, we can consider injections.  Please follow-up with [place] in about 1 month to see how your BP is doing.  If you have any questions or concerns, please do not hesitate to call the office at [phone].  Take Care,   Jule Ser, DO

## 2016-09-11 NOTE — Assessment & Plan Note (Signed)
A: He was seen on Friday 2/16 by general surgery after we attempted I&D in our clinic.  He reports the abscess drained a considerable amount of mixed blood and purulent fluid prior to seeing surgery but they were then able to I&D it further with more purulent drainage.  He was given a course of Bactrim and Tramadol for pain relief.  He has not been back to their office.   P:  - asked patient to follow up with general surgery to reassess - finish course of antibiotics - wean off pain medication as able - RTC as needed for this complaint

## 2016-09-11 NOTE — Progress Notes (Signed)
Case discussed with Dr. [name] soon after the resident saw the patient. We reviewed the resident's history and exam and pertinent patient test results. I agree with the assessment, diagnosis, and plan of care documented in the resident's note.

## 2016-09-11 NOTE — Progress Notes (Signed)
CC: here for f/u of Right groin abscess, HTN, and trigger finger  HPI:  [name] is a 51 y.o. man with a past medical history listed below here today for follow up of his right groin abscess, HTN, and trigger finger.   For details of today's visit and the status of his chronic medical issues please refer to the assessment and plan.   Past Medical History:  Diagnosis Date  . Anemia   . Arthritis   . CHF (congestive heart failure) ([place])   . Chronic kidney disease    stage 3-4 CKD, followed by Dr. Moshe Cipro  . ESRD (end stage renal disease) ([place])    ESRD due to HTN started dialysis Feb 2016  . Gout   . Gout, unspecified 08/13/2009   Qualifier: Diagnosis of  By: Redmond Pulling  MD, Mateo Flow    . H1N1 influenza    March 2016  . History of gout   . History of syphilis   . HIV (human immunodeficiency virus infection) ([place])   . HIV infection ([place]) 1980's   on ART therapy since, followed by ID clinic, complicated  by neuropathy  . HTN (hypertension)   . Hyperlipidemia    hypertrygliceridemia determined ti be secondary to ART therpay  . Hypertension   . HYPERTENSION 05/08/2006  . 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 Pneumonia   . Rib fractures 01/2009  . Seizure disorder ([place])   . Seizures ([place])    last seizure was >5 years ago, pt has family history of seizures  . 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 Syphilis 1997   history of syphilis 1997  . Tobacco abuse     Review of Systems:  Please see pertinent ROS reviewed in HPI and problem based charting.   Physical Exam:  Vitals:   09/11/16 1322  BP: (!) 166/81  Pulse: 82  Temp: 97.4 F (36.3 C)  TempSrc: Oral  SpO2: 99%  Weight: 247 lb 6.4 oz (112.2 kg)  Height: 5\' 9"  (1.753 m)   Physical Exam  Constitutional: He is well-developed, well-nourished, and in no distress.  HENT:   Head: Normocephalic and atraumatic.  Pulmonary/Chest: Effort normal.  Genitourinary:  Genitourinary Comments: Right groin abscess has improved considerably with no surrounding erythema or fluctuance.  No tenderness to palpation.  There is a small bandage over the site that is clean and intact.  Musculoskeletal:  Right 3rd and 4th digit with no TTP or deformity.  There is some catching with digit extension but he is able to actively flex and extend his fingers.  Skin: Skin is warm and dry.  Psychiatric: Mood and affect normal.    Assessment & Plan:   See Encounters Tab for problem based charting.  Patient discussed with Dr. Eppie Gibson .  Essential hypertension BP Readings from Last 3 Encounters:  09/11/16 (!) 166/81  09/04/16 (!) 191/87  06/17/16 126/78   A: BP is not controlled today.  Our clinic pharmacist was able to determine that hydralazine has been inconsistently filled by patient.  He confirms this today and [facility] [address]  He denies any CP, SOB, lightheadedness or headaches.  P:  - continue current medications of amlodipine 10mg  daily, carvedilol 25mg  BID, Cardura 8mg  daily, and Hydralazine 10mg  TID.  He is also on Lasix 40mg  daily.  Refills for all medications were sent to his pharmacy for 90 day supply. - can consider ACE inhibitor if patient unable to tolerate multiple times per day dosing of hydralazine - RTC 1 month to reassess BP  Abscess of groin, right A: He was seen on Friday 2/16 by general surgery after we attempted I&D in our clinic.  He reports the abscess drained a considerable amount of mixed blood and purulent fluid prior to seeing surgery but they were then able to I&D it further with more purulent drainage.  He was given a course of Bactrim and Tramadol for pain relief.  He has not been back to their office.   P:  - asked patient to follow up with general surgery to reassess - finish course of antibiotics - wean off  pain medication as able - RTC as needed for this complaint  Trigger finger of right hand A: Patient has noticed his right 3rd and 4th digit catching more over past 1-2 months.  Pain is not similar to cramping he previously experienced with HD.  He notices this most with difficulty extending his fingers.  P:  - discussed initial conservative treatment with splinting and pain control with tylenol as needed - discussed further treatment options such as steroid injection vs surgery.  At this time, he was uninterested in either treatment modality.

## 2016-09-12 DIAGNOSIS — N2581 Secondary hyperparathyroidism of renal origin: Secondary | ICD-10-CM | POA: Diagnosis not present

## 2016-09-12 DIAGNOSIS — D631 Anemia in chronic kidney disease: Secondary | ICD-10-CM | POA: Diagnosis not present

## 2016-09-12 DIAGNOSIS — N186 End stage renal disease: Secondary | ICD-10-CM | POA: Diagnosis not present

## 2016-09-15 DIAGNOSIS — N186 End stage renal disease: Secondary | ICD-10-CM | POA: Diagnosis not present

## 2016-09-15 DIAGNOSIS — D631 Anemia in chronic kidney disease: Secondary | ICD-10-CM | POA: Diagnosis not present

## 2016-09-15 DIAGNOSIS — N2581 Secondary hyperparathyroidism of renal origin: Secondary | ICD-10-CM | POA: Diagnosis not present

## 2016-09-17 DIAGNOSIS — N186 End stage renal disease: Secondary | ICD-10-CM | POA: Diagnosis not present

## 2016-09-17 DIAGNOSIS — I120 Hypertensive chronic kidney disease with stage 5 chronic kidney disease or end stage renal disease: Secondary | ICD-10-CM | POA: Diagnosis not present

## 2016-09-17 DIAGNOSIS — N2581 Secondary hyperparathyroidism of renal origin: Secondary | ICD-10-CM | POA: Diagnosis not present

## 2016-09-17 DIAGNOSIS — Z992 Dependence on renal dialysis: Secondary | ICD-10-CM | POA: Diagnosis not present

## 2016-09-17 DIAGNOSIS — D631 Anemia in chronic kidney disease: Secondary | ICD-10-CM | POA: Diagnosis not present

## 2016-09-19 DIAGNOSIS — N2581 Secondary hyperparathyroidism of renal origin: Secondary | ICD-10-CM | POA: Diagnosis not present

## 2016-09-19 DIAGNOSIS — N186 End stage renal disease: Secondary | ICD-10-CM | POA: Diagnosis not present

## 2016-09-19 DIAGNOSIS — D631 Anemia in chronic kidney disease: Secondary | ICD-10-CM | POA: Diagnosis not present

## 2016-09-22 DIAGNOSIS — N186 End stage renal disease: Secondary | ICD-10-CM | POA: Diagnosis not present

## 2016-09-22 DIAGNOSIS — D631 Anemia in chronic kidney disease: Secondary | ICD-10-CM | POA: Diagnosis not present

## 2016-09-22 DIAGNOSIS — N2581 Secondary hyperparathyroidism of renal origin: Secondary | ICD-10-CM | POA: Diagnosis not present

## 2016-09-23 DIAGNOSIS — H25813 Combined forms of age-related cataract, bilateral: Secondary | ICD-10-CM | POA: Diagnosis not present

## 2016-09-23 DIAGNOSIS — H35342 Macular cyst, hole, or pseudohole, left eye: Secondary | ICD-10-CM | POA: Diagnosis not present

## 2016-09-23 DIAGNOSIS — T2641XA Burn of right eye and adnexa, part unspecified, initial encounter: Secondary | ICD-10-CM | POA: Diagnosis not present

## 2016-09-23 DIAGNOSIS — H527 Unspecified disorder of refraction: Secondary | ICD-10-CM | POA: Diagnosis not present

## 2016-09-24 DIAGNOSIS — N186 End stage renal disease: Secondary | ICD-10-CM | POA: Diagnosis not present

## 2016-09-24 DIAGNOSIS — D631 Anemia in chronic kidney disease: Secondary | ICD-10-CM | POA: Diagnosis not present

## 2016-09-24 DIAGNOSIS — N2581 Secondary hyperparathyroidism of renal origin: Secondary | ICD-10-CM | POA: Diagnosis not present

## 2016-09-26 DIAGNOSIS — D631 Anemia in chronic kidney disease: Secondary | ICD-10-CM | POA: Diagnosis not present

## 2016-09-26 DIAGNOSIS — N2581 Secondary hyperparathyroidism of renal origin: Secondary | ICD-10-CM | POA: Diagnosis not present

## 2016-09-26 DIAGNOSIS — N186 End stage renal disease: Secondary | ICD-10-CM | POA: Diagnosis not present

## 2016-09-29 DIAGNOSIS — N2581 Secondary hyperparathyroidism of renal origin: Secondary | ICD-10-CM | POA: Diagnosis not present

## 2016-09-29 DIAGNOSIS — N186 End stage renal disease: Secondary | ICD-10-CM | POA: Diagnosis not present

## 2016-09-29 DIAGNOSIS — D631 Anemia in chronic kidney disease: Secondary | ICD-10-CM | POA: Diagnosis not present

## 2016-10-01 ENCOUNTER — Encounter: Payer: Self-pay | Admitting: Internal Medicine

## 2016-10-01 ENCOUNTER — Ambulatory Visit (INDEPENDENT_AMBULATORY_CARE_PROVIDER_SITE_OTHER): Payer: Medicare Other | Admitting: Internal Medicine

## 2016-10-01 VITALS — BP 168/87 | HR 82 | Temp 98.3°F | Ht 69.0 in | Wt 246.0 lb

## 2016-10-01 DIAGNOSIS — Z23 Encounter for immunization: Secondary | ICD-10-CM | POA: Diagnosis not present

## 2016-10-01 DIAGNOSIS — N186 End stage renal disease: Secondary | ICD-10-CM | POA: Diagnosis not present

## 2016-10-01 DIAGNOSIS — I10 Essential (primary) hypertension: Secondary | ICD-10-CM

## 2016-10-01 DIAGNOSIS — D631 Anemia in chronic kidney disease: Secondary | ICD-10-CM | POA: Diagnosis not present

## 2016-10-01 DIAGNOSIS — Z992 Dependence on renal dialysis: Secondary | ICD-10-CM | POA: Diagnosis not present

## 2016-10-01 DIAGNOSIS — Z113 Encounter for screening for infections with a predominantly sexual mode of transmission: Secondary | ICD-10-CM | POA: Diagnosis not present

## 2016-10-01 DIAGNOSIS — B20 Human immunodeficiency virus [HIV] disease: Secondary | ICD-10-CM

## 2016-10-01 DIAGNOSIS — N2581 Secondary hyperparathyroidism of renal origin: Secondary | ICD-10-CM | POA: Diagnosis not present

## 2016-10-01 LAB — CBC WITH DIFFERENTIAL/PLATELET
BASOS PCT: 0 %
Basophils Absolute: 0 cells/uL (ref 0–200)
EOS ABS: 284 {cells}/uL (ref 15–500)
Eosinophils Relative: 4 %
HEMATOCRIT: 29.7 % — AB (ref 38.5–50.0)
Hemoglobin: 9.8 g/dL — ABNORMAL LOW (ref 13.2–17.1)
Lymphocytes Relative: 26 %
Lymphs Abs: 1846 cells/uL (ref 850–3900)
MCH: 28.2 pg (ref 27.0–33.0)
MCHC: 33 g/dL (ref 32.0–36.0)
MCV: 85.6 fL (ref 80.0–100.0)
MONO ABS: 497 {cells}/uL (ref 200–950)
MPV: 8.6 fL (ref 7.5–12.5)
Monocytes Relative: 7 %
NEUTROS PCT: 63 %
Neutro Abs: 4473 cells/uL (ref 1500–7800)
Platelets: 184 10*3/uL (ref 140–400)
RBC: 3.47 MIL/uL — AB (ref 4.20–5.80)
RDW: 17.4 % — ABNORMAL HIGH (ref 11.0–15.0)
WBC: 7.1 10*3/uL (ref 3.8–10.8)

## 2016-10-01 NOTE — Progress Notes (Signed)
RFV: hiv disease  Patient ID: Darrell Johnson, male   DOB: 07/01/1966, 51 y.o.   MRN: [id number]  HPI 52yo M with HIV disease, CD 4 count 570/VL<20, on tivicay/rilpivirine/TDF. Since we last saw him he mentioned that he has had 3 episodes "shortness of breath" that he attributes to panic attack, though he doesn't have hx of anxiety. The last episode in Nov 2017, he was diagnosed with acute pulmonary edema, after missing dialysis, which he rarely does per patient report. He required intubation briefly.One episode was also Potentially it was related to being on high dose steroids for hip bursitis, that may have caused him to retain fluids. He was recently treated for a right groin abscess in early February. He was given a course of bactrim. Had follow up with PCP through IM clinic which showed resolution after I x D by general surgery.  He states one of his hospital paperwork has diagnosed him with herpes though he does not recall having outbreaks  Soc hx: his son plays for the [employer], taking a year off to be with family  I have reviewed his records in [facility] link  Outpatient Encounter Prescriptions as of 10/01/2016  Medication Sig  . albuterol (PROVENTIL HFA;VENTOLIN HFA) 108 (90 Base) MCG/ACT inhaler Inhale 2 puffs into the lungs every 6 (six) hours as needed for wheezing or shortness of breath.  Marland Kitchen albuterol (PROVENTIL) (2.5 MG/3ML) 0.083% nebulizer solution Take 3 mLs (2.5 mg total) by nebulization every 6 (six) hours as needed for wheezing or shortness of breath.  Marland Kitchen amLODipine (NORVASC) 10 MG tablet Take 10 mg by mouth daily.  Marland Kitchen buPROPion (WELLBUTRIN) 75 MG tablet TAKE 1/2 TABLET TWICE A DAY  . carvedilol (COREG) 25 MG tablet TAKE 1 TABLET (25 MG TOTAL) BY MOUTH 2 (TWO) TIMES DAILY WITH A MEAL.  Marland Kitchen dolutegravir (TIVICAY) 50 MG tablet Take 1 tablet (50 mg total) by mouth daily.  Marland Kitchen doxazosin (CARDURA) 8 MG tablet TAKE 1 TABLET (8 MG TOTAL) BY MOUTH AT BEDTIME.  . Febuxostat  (ULORIC) 80 MG TABS Take 80 mg by mouth daily as needed (gout).   . furosemide (LASIX) 40 MG tablet Take 1 tablet (40 mg total) by mouth daily.  . hydrALAZINE (APRESOLINE) 10 MG tablet Take 1 tablet (10 mg total) by mouth 3 (three) times daily.  . pravastatin (PRAVACHOL) 40 MG tablet Take 1 tablet (40 mg total) by mouth every evening.  . rilpivirine (EDURANT) 25 MG TABS tablet Take 1 tablet (25 mg total) by mouth daily with breakfast. (Patient taking differently: Take 25 mg by mouth daily. After dialysis)  . sevelamer carbonate (RENVELA) 800 MG tablet Take 2,400 mg by mouth See admin instructions. Take 3 tablets (2400 mg) by mouth with meals and snacks  . tenofovir (VIREAD) 300 MG tablet TAKE 1 TABLET BY MOUTH ONCE A WEEK (Patient taking differently: Take 300 mg by mouth every Sunday. )  . [DISCONTINUED] amLODipine (NORVASC) 5 MG tablet Take 2 tablets (10 mg total) by mouth daily.  . [DISCONTINUED] colchicine 0.6 MG tablet 1 capsule PO, 3 times per week after HD (Patient taking differently: Take 0.6 mg by mouth daily as needed (gout flares). 1 capsule PO, 3 times per week after HD)  . calcitRIOL (ROCALTROL) 0.5 MCG capsule Take by mouth.  . montelukast (SINGULAIR) 10 MG tablet Take by mouth.  . [DISCONTINUED] Liniments (SALONPAS EX) Apply 1 patch topically daily as needed (pain).   No facility-administered encounter medications on file as of 10/01/2016.  Patient Active Problem List   Diagnosis Date Noted  . Trigger finger of right hand 09/11/2016  . Abscess of groin, right 09/04/2016  . Hyperlipidemia 03/31/2016  . Anxiety state 03/11/2016  . Lumbar radiculopathy 01/28/2016  . Seasonal allergies   . Healthcare maintenance 05/03/2015  . Anemia in chronic kidney disease 08/30/2014  . End stage renal disease ([place]) 12/27/2013  . Drug noncompliance 11/07/2013  . History of syphilis 11/07/2013  . Arthritis 09/09/2013  . Tobacco abuse 09/07/2013  . Chronic pain disorder 04/28/2013  .  HYPERTRIGLYCERIDEMIA 11/01/2009  . Gout 08/13/2009  . ERECTILE DYSFUNCTION 06/08/2008  . Human immunodeficiency virus (HIV) disease ([place]) 05/08/2006  . PERIPHERAL NEUROPATHY 05/08/2006  . Essential hypertension 05/08/2006  . SEIZURE DISORDER 05/08/2006   Social History  Substance Use Topics  . Smoking status: Former Smoker    Packs/day: 0.40    Years: 20.00    Start date: 01/10/2016    Quit date: 04/07/2016  . Smokeless tobacco: Never Used  . Alcohol use No    There are no preventive care reminders to display for this patient.   Review of Systems  Constitutional: Negative for fever, chills, diaphoresis, activity change, appetite change, fatigue and unexpected weight change.  HENT: Negative for congestion, sore throat, rhinorrhea, sneezing, trouble swallowing and sinus pressure.  Eyes: Negative for photophobia and visual disturbance.  Respiratory: Negative for cough, chest tightness, shortness of breath, wheezing and stridor.  Cardiovascular: Negative for chest pain, palpitations and leg swelling.  Gastrointestinal: Negative for nausea, vomiting, abdominal pain, diarrhea, constipation, blood in stool, abdominal distention and anal bleeding.  Genitourinary: Negative for dysuria, hematuria, flank pain and difficulty urinating.  Musculoskeletal: Negative for myalgias, back pain, joint swelling, arthralgias and gait problem.  Skin: Negative for color change, pallor, rash and wound.  Neurological: Negative for dizziness, tremors, weakness and light-headedness.  Hematological: Negative for adenopathy. Does not bruise/bleed easily.  Psychiatric/Behavioral: Negative for behavioral problems, confusion, sleep disturbance, dysphoric mood, decreased concentration and agitation.    Physical Exam   BP (!) 168/87   Pulse 82   Temp 98.3 F (36.8 C) (Oral)   Ht 5\' 9"  (1.753 m)   Wt 246 lb (111.6 kg)   BMI 36.33 kg/m   General appearance: A xO by 4 in NAD Resp: CTAB, breath sounds  bilaterally Cardio: regular rate and rhythm, S1, S2 normal, no murmur, click, rub or gallop GI: soft, non-tender; bowel sounds normal; no masses,  no organomegaly Extremities: trace edema Right arm AV fistula with good thrill and bruit Lab Results  Component Value Date   CD4TCELL 36 09/24/2015   Lab Results  Component Value Date   CD4TABS 570 09/24/2015   CD4TABS 270 (L) 11/22/2014   CD4TABS 230 (L) 08/17/2014   Lab Results  Component Value Date   HIV1RNAQUANT <20 09/24/2015   Lab Results  Component Value Date   HEPBSAB POS (A) 08/17/2014   Lab Results  Component Value Date   LABRPR NON REAC 09/24/2015    CBC Lab Results  Component Value Date   WBC 8.1 06/17/2016   RBC 2.82 (L) 06/17/2016   HGB 8.2 (L) 06/17/2016   HCT 25.1 (L) 06/17/2016   PLT 137 (L) 06/17/2016   MCV 89.0 06/17/2016   MCH 29.1 06/17/2016   MCHC 32.7 06/17/2016   RDW 17.9 (H) 06/17/2016   LYMPHSABS 3.1 06/16/2016   MONOABS 0.8 06/16/2016   EOSABS 0.4 06/16/2016    BMET Lab Results  Component Value Date   NA 134 (  L) 06/17/2016   K 4.0 06/17/2016   CL 93 (L) 06/17/2016   CO2 28 06/17/2016   GLUCOSE 80 06/17/2016   BUN 36 (H) 06/17/2016   CREATININE 8.65 (H) 06/17/2016   CALCIUM 8.8 (L) 06/17/2016   GFRNONAA 6 (L) 06/17/2016   GFRAA 7 (L) 06/17/2016      Assessment and Plan   hiv disease= will check labs today, continue on tivicaydaily/ rilpivirine/tenofovir weekly  Episodes of pulmonary edema = appears no recent events since November 2017. Continue to recommend to keep with his regular scheduled HD M-W-F. Will reach out to his PCP, Dr [name], to see if need for TTE  htn = elevated this morning but he is heading to dialysis this afternoon  ESRD/transplant work up= follows up yearly at [facility], due to see them in May-July 2018  Health maintenance = will give meningococcal vaccine

## 2016-10-02 ENCOUNTER — Other Ambulatory Visit: Payer: Self-pay | Admitting: Internal Medicine

## 2016-10-02 ENCOUNTER — Telehealth: Payer: Self-pay | Admitting: *Deleted

## 2016-10-02 ENCOUNTER — Telehealth: Payer: Self-pay

## 2016-10-02 LAB — RPR

## 2016-10-02 LAB — T-HELPER CELL (CD4) - (RCID CLINIC ONLY)
CD4 % Helper T Cell: 33 % (ref 33–55)
CD4 T Cell Abs: 600 /uL (ref 400–2700)

## 2016-10-02 LAB — HSV 2 ANTIBODY, IGG: HSV 2 Glycoprotein G Ab, IgG: 10.9 Index — ABNORMAL HIGH (ref <0.90)

## 2016-10-02 LAB — HSV 1 ANTIBODY, IGG: HSV 1 Glycoprotein G Ab, IgG: 33 Index — ABNORMAL HIGH (ref <0.90)

## 2016-10-02 MED ORDER — ALBUTEROL SULFATE HFA 108 (90 BASE) MCG/ACT IN AERS: 2.0000 | INHALATION_SPRAY | Freq: Four times a day (QID) | RESPIRATORY_TRACT | 2 refills | Status: DC | PRN

## 2016-10-02 NOTE — Telephone Encounter (Signed)
Per pharmacy the patient's medicare part B plan has limitation on Proair. In order to prevent rejection of next refill please prescribe applicable therapy covered by plan. Alternatives include ventolin HFA 90 mcg inhaler or xopenex HFA 45 mcg please write new RX

## 2016-10-02 NOTE — Telephone Encounter (Signed)
I have sent in for Ventolin HFA 50mcg inhaler.  Please let me know if any further action needed.  Thanks!

## 2016-10-03 DIAGNOSIS — D631 Anemia in chronic kidney disease: Secondary | ICD-10-CM | POA: Diagnosis not present

## 2016-10-03 DIAGNOSIS — N2581 Secondary hyperparathyroidism of renal origin: Secondary | ICD-10-CM | POA: Diagnosis not present

## 2016-10-03 DIAGNOSIS — N186 End stage renal disease: Secondary | ICD-10-CM | POA: Diagnosis not present

## 2016-10-03 LAB — HIV-1 RNA QUANT-NO REFLEX-BLD
HIV 1 RNA Quant: 28 copies/mL — ABNORMAL HIGH
HIV-1 RNA QUANT, LOG: 1.45 {Log_copies}/mL — AB

## 2016-10-06 DIAGNOSIS — N2581 Secondary hyperparathyroidism of renal origin: Secondary | ICD-10-CM | POA: Diagnosis not present

## 2016-10-06 DIAGNOSIS — D631 Anemia in chronic kidney disease: Secondary | ICD-10-CM | POA: Diagnosis not present

## 2016-10-06 DIAGNOSIS — N186 End stage renal disease: Secondary | ICD-10-CM | POA: Diagnosis not present

## 2016-10-08 DIAGNOSIS — N186 End stage renal disease: Secondary | ICD-10-CM | POA: Diagnosis not present

## 2016-10-08 DIAGNOSIS — D631 Anemia in chronic kidney disease: Secondary | ICD-10-CM | POA: Diagnosis not present

## 2016-10-08 DIAGNOSIS — N2581 Secondary hyperparathyroidism of renal origin: Secondary | ICD-10-CM | POA: Diagnosis not present

## 2016-10-09 ENCOUNTER — Ambulatory Visit (INDEPENDENT_AMBULATORY_CARE_PROVIDER_SITE_OTHER): Payer: Medicare Other | Admitting: Internal Medicine

## 2016-10-09 ENCOUNTER — Telehealth: Payer: Self-pay | Admitting: Internal Medicine

## 2016-10-09 ENCOUNTER — Encounter: Payer: Self-pay | Admitting: Internal Medicine

## 2016-10-09 DIAGNOSIS — F17211 Nicotine dependence, cigarettes, in remission: Secondary | ICD-10-CM | POA: Diagnosis not present

## 2016-10-09 DIAGNOSIS — Z79899 Other long term (current) drug therapy: Secondary | ICD-10-CM | POA: Diagnosis not present

## 2016-10-09 DIAGNOSIS — Z992 Dependence on renal dialysis: Secondary | ICD-10-CM

## 2016-10-09 DIAGNOSIS — I10 Essential (primary) hypertension: Secondary | ICD-10-CM

## 2016-10-09 MED ORDER — HYDRALAZINE HCL 25 MG PO TABS: 25.0000 mg | ORAL_TABLET | Freq: Three times a day (TID) | ORAL | 3 refills | Status: DC

## 2016-10-09 NOTE — Progress Notes (Signed)
   CC: HTN  HPI:  [name] is a 51 y.o. with PMHx as outlined below who presents to clinic for htn follow up. Please see problem list for further details of patient's chronic medical issues.    Past Medical History:  Diagnosis Date  . Anemia   . Arthritis   . CHF (congestive heart failure) ([place])   . Chronic kidney disease    stage 3-4 CKD, followed by Dr. Moshe Johnson  . ESRD (end stage renal disease) ([place])    ESRD due to HTN started dialysis Feb 2016  . Gout   . Gout, unspecified 08/13/2009   Qualifier: Diagnosis of  By: Redmond Pulling  MD, Darrell Johnson    . H1N1 influenza    March 2016  . History of gout   . History of syphilis   . HIV (human immunodeficiency virus infection) ([place])   . HIV infection ([place]) 1980's   on ART therapy since, followed by ID clinic, complicated  by neuropathy  . HTN (hypertension)   . Hyperlipidemia    hypertrygliceridemia determined ti be secondary to ART therpay  . Hypertension   . HYPERTENSION 05/08/2006  . 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 Pneumonia   . Rib fractures 01/2009  . Seizure disorder ([place])   . Seizures ([place])    last seizure was >5 years ago, pt has family history of seizures  . 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 Syphilis 1997   history of syphilis 1997  . Tobacco abuse     Review of Systems:  Denies HAs, n/v, and leg edema.   Physical Exam:  Vitals:   10/09/16 1328  BP: (!) 161/82  Pulse: 81  Temp: 98.8 F (37.1 C)  TempSrc: Oral  SpO2: 99%  Weight: 247 lb 4.8 oz (112.2 kg)  Height: 5\' 9"  (1.753 m)   Physical Exam  Constitutional: oriented to person, place, and time. appears well-developed and well-nourished. No distress.  HENT:  Head: Normocephalic and atraumatic.  Nose: Nose normal.  Cardiovascular: Normal rate, regular rhythm and normal heart sounds.  Exam reveals no  gallop and no friction rub.   No murmur heard. Neurological: alert and oriented to person, place, and time.  Skin: Skin is warm and dry. No rash noted.  not diaphoretic. No erythema. No pallor.    Assessment & Plan:   See Encounters Tab for problem based charting.  Patient discussed with Dr. Heber [place]

## 2016-10-09 NOTE — Patient Instructions (Signed)
Start taking hydralazine 25mg  three times a day. On Monday, Wednesday, and Fridays do not take cardura.

## 2016-10-09 NOTE — Telephone Encounter (Signed)
APT. REMINDER CALL, NO ANSWER, NO VOICEMAIL °

## 2016-10-09 NOTE — Assessment & Plan Note (Signed)
A: Pt presents for HTN f/u. He was seen last month and was noted to be non compliant with hydralazine 10mg  TID. He is also on norvasc 10mg , cardura 8mg , coreg 25mg  BID, and lasix 40mg . Today he says he took all of his medications and is due for afternoon dose of hydralazine. He is on MWF HD and reports hypotensive episodes when his BP is controlled in the 130s.   P: increase hydralazine to 25mg  TID and instructed not to take cardura 8mg  on MWF when he has HD as orthostatic hypotension is one if its SE. f/u in 1 month for BP check.

## 2016-10-10 DIAGNOSIS — N186 End stage renal disease: Secondary | ICD-10-CM | POA: Diagnosis not present

## 2016-10-10 DIAGNOSIS — D631 Anemia in chronic kidney disease: Secondary | ICD-10-CM | POA: Diagnosis not present

## 2016-10-10 DIAGNOSIS — N2581 Secondary hyperparathyroidism of renal origin: Secondary | ICD-10-CM | POA: Diagnosis not present

## 2016-10-10 NOTE — Progress Notes (Signed)
Internal Medicine Clinic Attending  Case discussed with Dr. Truong at the time of the visit.  We reviewed the resident's history and exam and pertinent patient test results.  I agree with the assessment, diagnosis, and plan of care documented in the resident's note.  

## 2016-10-13 DIAGNOSIS — D631 Anemia in chronic kidney disease: Secondary | ICD-10-CM | POA: Diagnosis not present

## 2016-10-13 DIAGNOSIS — N2581 Secondary hyperparathyroidism of renal origin: Secondary | ICD-10-CM | POA: Diagnosis not present

## 2016-10-13 DIAGNOSIS — N186 End stage renal disease: Secondary | ICD-10-CM | POA: Diagnosis not present

## 2016-10-15 DIAGNOSIS — N2581 Secondary hyperparathyroidism of renal origin: Secondary | ICD-10-CM | POA: Diagnosis not present

## 2016-10-15 DIAGNOSIS — D631 Anemia in chronic kidney disease: Secondary | ICD-10-CM | POA: Diagnosis not present

## 2016-10-15 DIAGNOSIS — N186 End stage renal disease: Secondary | ICD-10-CM | POA: Diagnosis not present

## 2016-10-17 DIAGNOSIS — N186 End stage renal disease: Secondary | ICD-10-CM | POA: Diagnosis not present

## 2016-10-17 DIAGNOSIS — N2581 Secondary hyperparathyroidism of renal origin: Secondary | ICD-10-CM | POA: Diagnosis not present

## 2016-10-17 DIAGNOSIS — D631 Anemia in chronic kidney disease: Secondary | ICD-10-CM | POA: Diagnosis not present

## 2016-10-18 DIAGNOSIS — I120 Hypertensive chronic kidney disease with stage 5 chronic kidney disease or end stage renal disease: Secondary | ICD-10-CM | POA: Diagnosis not present

## 2016-10-18 DIAGNOSIS — Z992 Dependence on renal dialysis: Secondary | ICD-10-CM | POA: Diagnosis not present

## 2016-10-18 DIAGNOSIS — N186 End stage renal disease: Secondary | ICD-10-CM | POA: Diagnosis not present

## 2016-10-20 DIAGNOSIS — N2581 Secondary hyperparathyroidism of renal origin: Secondary | ICD-10-CM | POA: Diagnosis not present

## 2016-10-20 DIAGNOSIS — N186 End stage renal disease: Secondary | ICD-10-CM | POA: Diagnosis not present

## 2016-10-20 DIAGNOSIS — D631 Anemia in chronic kidney disease: Secondary | ICD-10-CM | POA: Diagnosis not present

## 2016-10-22 DIAGNOSIS — N2581 Secondary hyperparathyroidism of renal origin: Secondary | ICD-10-CM | POA: Diagnosis not present

## 2016-10-22 DIAGNOSIS — D631 Anemia in chronic kidney disease: Secondary | ICD-10-CM | POA: Diagnosis not present

## 2016-10-22 DIAGNOSIS — N186 End stage renal disease: Secondary | ICD-10-CM | POA: Diagnosis not present

## 2016-10-24 DIAGNOSIS — N2581 Secondary hyperparathyroidism of renal origin: Secondary | ICD-10-CM | POA: Diagnosis not present

## 2016-10-24 DIAGNOSIS — N186 End stage renal disease: Secondary | ICD-10-CM | POA: Diagnosis not present

## 2016-10-24 DIAGNOSIS — D631 Anemia in chronic kidney disease: Secondary | ICD-10-CM | POA: Diagnosis not present

## 2016-10-28 DIAGNOSIS — D631 Anemia in chronic kidney disease: Secondary | ICD-10-CM | POA: Diagnosis not present

## 2016-10-28 DIAGNOSIS — N186 End stage renal disease: Secondary | ICD-10-CM | POA: Diagnosis not present

## 2016-10-28 DIAGNOSIS — N2581 Secondary hyperparathyroidism of renal origin: Secondary | ICD-10-CM | POA: Diagnosis not present

## 2016-10-29 DIAGNOSIS — D631 Anemia in chronic kidney disease: Secondary | ICD-10-CM | POA: Diagnosis not present

## 2016-10-29 DIAGNOSIS — N2581 Secondary hyperparathyroidism of renal origin: Secondary | ICD-10-CM | POA: Diagnosis not present

## 2016-10-29 DIAGNOSIS — N186 End stage renal disease: Secondary | ICD-10-CM | POA: Diagnosis not present

## 2016-10-31 DIAGNOSIS — N186 End stage renal disease: Secondary | ICD-10-CM | POA: Diagnosis not present

## 2016-10-31 DIAGNOSIS — D631 Anemia in chronic kidney disease: Secondary | ICD-10-CM | POA: Diagnosis not present

## 2016-10-31 DIAGNOSIS — N2581 Secondary hyperparathyroidism of renal origin: Secondary | ICD-10-CM | POA: Diagnosis not present

## 2016-11-03 DIAGNOSIS — N186 End stage renal disease: Secondary | ICD-10-CM | POA: Diagnosis not present

## 2016-11-03 DIAGNOSIS — D631 Anemia in chronic kidney disease: Secondary | ICD-10-CM | POA: Diagnosis not present

## 2016-11-03 DIAGNOSIS — N2581 Secondary hyperparathyroidism of renal origin: Secondary | ICD-10-CM | POA: Diagnosis not present

## 2016-11-05 DIAGNOSIS — N186 End stage renal disease: Secondary | ICD-10-CM | POA: Diagnosis not present

## 2016-11-05 DIAGNOSIS — N2581 Secondary hyperparathyroidism of renal origin: Secondary | ICD-10-CM | POA: Diagnosis not present

## 2016-11-05 DIAGNOSIS — D631 Anemia in chronic kidney disease: Secondary | ICD-10-CM | POA: Diagnosis not present

## 2016-11-07 DIAGNOSIS — N186 End stage renal disease: Secondary | ICD-10-CM | POA: Diagnosis not present

## 2016-11-07 DIAGNOSIS — N2581 Secondary hyperparathyroidism of renal origin: Secondary | ICD-10-CM | POA: Diagnosis not present

## 2016-11-07 DIAGNOSIS — D631 Anemia in chronic kidney disease: Secondary | ICD-10-CM | POA: Diagnosis not present

## 2016-11-10 DIAGNOSIS — N2581 Secondary hyperparathyroidism of renal origin: Secondary | ICD-10-CM | POA: Diagnosis not present

## 2016-11-10 DIAGNOSIS — N186 End stage renal disease: Secondary | ICD-10-CM | POA: Diagnosis not present

## 2016-11-10 DIAGNOSIS — D631 Anemia in chronic kidney disease: Secondary | ICD-10-CM | POA: Diagnosis not present

## 2016-11-12 DIAGNOSIS — M533 Sacrococcygeal disorders, not elsewhere classified: Secondary | ICD-10-CM | POA: Diagnosis not present

## 2016-11-12 DIAGNOSIS — M1009 Idiopathic gout, multiple sites: Secondary | ICD-10-CM | POA: Diagnosis not present

## 2016-11-12 DIAGNOSIS — M150 Primary generalized (osteo)arthritis: Secondary | ICD-10-CM | POA: Diagnosis not present

## 2016-11-12 DIAGNOSIS — L608 Other nail disorders: Secondary | ICD-10-CM | POA: Diagnosis not present

## 2016-11-12 DIAGNOSIS — N186 End stage renal disease: Secondary | ICD-10-CM | POA: Diagnosis not present

## 2016-11-12 DIAGNOSIS — L299 Pruritus, unspecified: Secondary | ICD-10-CM | POA: Diagnosis not present

## 2016-11-12 DIAGNOSIS — E669 Obesity, unspecified: Secondary | ICD-10-CM | POA: Diagnosis not present

## 2016-11-12 DIAGNOSIS — M25551 Pain in right hip: Secondary | ICD-10-CM | POA: Diagnosis not present

## 2016-11-12 DIAGNOSIS — Z6835 Body mass index (BMI) 35.0-35.9, adult: Secondary | ICD-10-CM | POA: Diagnosis not present

## 2016-11-12 DIAGNOSIS — N2581 Secondary hyperparathyroidism of renal origin: Secondary | ICD-10-CM | POA: Diagnosis not present

## 2016-11-12 DIAGNOSIS — Z21 Asymptomatic human immunodeficiency virus [HIV] infection status: Secondary | ICD-10-CM | POA: Diagnosis not present

## 2016-11-12 DIAGNOSIS — D631 Anemia in chronic kidney disease: Secondary | ICD-10-CM | POA: Diagnosis not present

## 2016-11-12 DIAGNOSIS — M2550 Pain in unspecified joint: Secondary | ICD-10-CM | POA: Diagnosis not present

## 2016-11-14 DIAGNOSIS — N186 End stage renal disease: Secondary | ICD-10-CM | POA: Diagnosis not present

## 2016-11-14 DIAGNOSIS — D631 Anemia in chronic kidney disease: Secondary | ICD-10-CM | POA: Diagnosis not present

## 2016-11-14 DIAGNOSIS — N2581 Secondary hyperparathyroidism of renal origin: Secondary | ICD-10-CM | POA: Diagnosis not present

## 2016-11-17 DIAGNOSIS — N186 End stage renal disease: Secondary | ICD-10-CM | POA: Diagnosis not present

## 2016-11-17 DIAGNOSIS — Z992 Dependence on renal dialysis: Secondary | ICD-10-CM | POA: Diagnosis not present

## 2016-11-17 DIAGNOSIS — I120 Hypertensive chronic kidney disease with stage 5 chronic kidney disease or end stage renal disease: Secondary | ICD-10-CM | POA: Diagnosis not present

## 2016-11-17 DIAGNOSIS — D631 Anemia in chronic kidney disease: Secondary | ICD-10-CM | POA: Diagnosis not present

## 2016-11-17 DIAGNOSIS — N2581 Secondary hyperparathyroidism of renal origin: Secondary | ICD-10-CM | POA: Diagnosis not present

## 2016-11-19 DIAGNOSIS — N186 End stage renal disease: Secondary | ICD-10-CM | POA: Diagnosis not present

## 2016-11-19 DIAGNOSIS — D631 Anemia in chronic kidney disease: Secondary | ICD-10-CM | POA: Diagnosis not present

## 2016-11-19 DIAGNOSIS — N2581 Secondary hyperparathyroidism of renal origin: Secondary | ICD-10-CM | POA: Diagnosis not present

## 2016-11-21 ENCOUNTER — Other Ambulatory Visit: Payer: Self-pay | Admitting: Internal Medicine

## 2016-11-21 DIAGNOSIS — D631 Anemia in chronic kidney disease: Secondary | ICD-10-CM | POA: Diagnosis not present

## 2016-11-21 DIAGNOSIS — N186 End stage renal disease: Secondary | ICD-10-CM | POA: Diagnosis not present

## 2016-11-21 DIAGNOSIS — N2581 Secondary hyperparathyroidism of renal origin: Secondary | ICD-10-CM | POA: Diagnosis not present

## 2016-11-21 DIAGNOSIS — I10 Essential (primary) hypertension: Secondary | ICD-10-CM

## 2016-11-21 NOTE — Telephone Encounter (Signed)
Refill Request from [facility] for his hydrALAZINE (APRESOLINE) 25 MG tablet medication.

## 2016-11-24 DIAGNOSIS — N186 End stage renal disease: Secondary | ICD-10-CM | POA: Diagnosis not present

## 2016-11-24 DIAGNOSIS — D631 Anemia in chronic kidney disease: Secondary | ICD-10-CM | POA: Diagnosis not present

## 2016-11-24 DIAGNOSIS — N2581 Secondary hyperparathyroidism of renal origin: Secondary | ICD-10-CM | POA: Diagnosis not present

## 2016-11-24 MED ORDER — HYDRALAZINE HCL 25 MG PO TABS: 25.0000 mg | ORAL_TABLET | Freq: Three times a day (TID) | ORAL | 1 refills | Status: DC

## 2016-11-26 DIAGNOSIS — D631 Anemia in chronic kidney disease: Secondary | ICD-10-CM | POA: Diagnosis not present

## 2016-11-26 DIAGNOSIS — N2581 Secondary hyperparathyroidism of renal origin: Secondary | ICD-10-CM | POA: Diagnosis not present

## 2016-11-26 DIAGNOSIS — N186 End stage renal disease: Secondary | ICD-10-CM | POA: Diagnosis not present

## 2016-11-28 DIAGNOSIS — D631 Anemia in chronic kidney disease: Secondary | ICD-10-CM | POA: Diagnosis not present

## 2016-11-28 DIAGNOSIS — N186 End stage renal disease: Secondary | ICD-10-CM | POA: Diagnosis not present

## 2016-11-28 DIAGNOSIS — N2581 Secondary hyperparathyroidism of renal origin: Secondary | ICD-10-CM | POA: Diagnosis not present

## 2016-12-01 DIAGNOSIS — N186 End stage renal disease: Secondary | ICD-10-CM | POA: Diagnosis not present

## 2016-12-01 DIAGNOSIS — N2581 Secondary hyperparathyroidism of renal origin: Secondary | ICD-10-CM | POA: Diagnosis not present

## 2016-12-01 DIAGNOSIS — D631 Anemia in chronic kidney disease: Secondary | ICD-10-CM | POA: Diagnosis not present

## 2016-12-03 DIAGNOSIS — N186 End stage renal disease: Secondary | ICD-10-CM | POA: Diagnosis not present

## 2016-12-03 DIAGNOSIS — D631 Anemia in chronic kidney disease: Secondary | ICD-10-CM | POA: Diagnosis not present

## 2016-12-03 DIAGNOSIS — N2581 Secondary hyperparathyroidism of renal origin: Secondary | ICD-10-CM | POA: Diagnosis not present

## 2016-12-05 DIAGNOSIS — D631 Anemia in chronic kidney disease: Secondary | ICD-10-CM | POA: Diagnosis not present

## 2016-12-05 DIAGNOSIS — N2581 Secondary hyperparathyroidism of renal origin: Secondary | ICD-10-CM | POA: Diagnosis not present

## 2016-12-05 DIAGNOSIS — N186 End stage renal disease: Secondary | ICD-10-CM | POA: Diagnosis not present

## 2016-12-07 ENCOUNTER — Other Ambulatory Visit: Payer: Self-pay | Admitting: Internal Medicine

## 2016-12-07 DIAGNOSIS — F411 Generalized anxiety disorder: Secondary | ICD-10-CM

## 2016-12-09 DIAGNOSIS — N2581 Secondary hyperparathyroidism of renal origin: Secondary | ICD-10-CM | POA: Diagnosis not present

## 2016-12-09 DIAGNOSIS — D631 Anemia in chronic kidney disease: Secondary | ICD-10-CM | POA: Diagnosis not present

## 2016-12-09 DIAGNOSIS — N186 End stage renal disease: Secondary | ICD-10-CM | POA: Diagnosis not present

## 2016-12-10 DIAGNOSIS — N2581 Secondary hyperparathyroidism of renal origin: Secondary | ICD-10-CM | POA: Diagnosis not present

## 2016-12-10 DIAGNOSIS — D631 Anemia in chronic kidney disease: Secondary | ICD-10-CM | POA: Diagnosis not present

## 2016-12-10 DIAGNOSIS — N186 End stage renal disease: Secondary | ICD-10-CM | POA: Diagnosis not present

## 2016-12-12 DIAGNOSIS — N2581 Secondary hyperparathyroidism of renal origin: Secondary | ICD-10-CM | POA: Diagnosis not present

## 2016-12-12 DIAGNOSIS — D631 Anemia in chronic kidney disease: Secondary | ICD-10-CM | POA: Diagnosis not present

## 2016-12-12 DIAGNOSIS — N186 End stage renal disease: Secondary | ICD-10-CM | POA: Diagnosis not present

## 2016-12-15 DIAGNOSIS — N186 End stage renal disease: Secondary | ICD-10-CM | POA: Diagnosis not present

## 2016-12-15 DIAGNOSIS — D631 Anemia in chronic kidney disease: Secondary | ICD-10-CM | POA: Diagnosis not present

## 2016-12-15 DIAGNOSIS — N2581 Secondary hyperparathyroidism of renal origin: Secondary | ICD-10-CM | POA: Diagnosis not present

## 2016-12-16 ENCOUNTER — Other Ambulatory Visit: Payer: Self-pay

## 2016-12-17 DIAGNOSIS — D631 Anemia in chronic kidney disease: Secondary | ICD-10-CM | POA: Diagnosis not present

## 2016-12-17 DIAGNOSIS — N2581 Secondary hyperparathyroidism of renal origin: Secondary | ICD-10-CM | POA: Diagnosis not present

## 2016-12-17 DIAGNOSIS — N186 End stage renal disease: Secondary | ICD-10-CM | POA: Diagnosis not present

## 2016-12-17 MED ORDER — MONTELUKAST SODIUM 10 MG PO TABS: 10.0000 mg | ORAL_TABLET | Freq: Every day | ORAL | 2 refills | Status: DC

## 2016-12-18 ENCOUNTER — Telehealth: Payer: Self-pay | Admitting: *Deleted

## 2016-12-18 DIAGNOSIS — I120 Hypertensive chronic kidney disease with stage 5 chronic kidney disease or end stage renal disease: Secondary | ICD-10-CM | POA: Diagnosis not present

## 2016-12-18 DIAGNOSIS — Z992 Dependence on renal dialysis: Secondary | ICD-10-CM | POA: Diagnosis not present

## 2016-12-18 DIAGNOSIS — N186 End stage renal disease: Secondary | ICD-10-CM | POA: Diagnosis not present

## 2016-12-18 NOTE — Telephone Encounter (Signed)
Error

## 2016-12-19 DIAGNOSIS — N186 End stage renal disease: Secondary | ICD-10-CM | POA: Diagnosis not present

## 2016-12-19 DIAGNOSIS — N2581 Secondary hyperparathyroidism of renal origin: Secondary | ICD-10-CM | POA: Diagnosis not present

## 2016-12-19 DIAGNOSIS — D631 Anemia in chronic kidney disease: Secondary | ICD-10-CM | POA: Diagnosis not present

## 2016-12-22 ENCOUNTER — Other Ambulatory Visit: Payer: Self-pay | Admitting: Internal Medicine

## 2016-12-22 DIAGNOSIS — B20 Human immunodeficiency virus [HIV] disease: Secondary | ICD-10-CM

## 2016-12-22 DIAGNOSIS — N186 End stage renal disease: Secondary | ICD-10-CM | POA: Diagnosis not present

## 2016-12-22 DIAGNOSIS — N2581 Secondary hyperparathyroidism of renal origin: Secondary | ICD-10-CM | POA: Diagnosis not present

## 2016-12-22 DIAGNOSIS — D631 Anemia in chronic kidney disease: Secondary | ICD-10-CM | POA: Diagnosis not present

## 2016-12-24 DIAGNOSIS — N186 End stage renal disease: Secondary | ICD-10-CM | POA: Diagnosis not present

## 2016-12-24 DIAGNOSIS — N2581 Secondary hyperparathyroidism of renal origin: Secondary | ICD-10-CM | POA: Diagnosis not present

## 2016-12-24 DIAGNOSIS — D631 Anemia in chronic kidney disease: Secondary | ICD-10-CM | POA: Diagnosis not present

## 2016-12-26 DIAGNOSIS — D631 Anemia in chronic kidney disease: Secondary | ICD-10-CM | POA: Diagnosis not present

## 2016-12-26 DIAGNOSIS — N186 End stage renal disease: Secondary | ICD-10-CM | POA: Diagnosis not present

## 2016-12-26 DIAGNOSIS — N2581 Secondary hyperparathyroidism of renal origin: Secondary | ICD-10-CM | POA: Diagnosis not present

## 2016-12-29 DIAGNOSIS — N2581 Secondary hyperparathyroidism of renal origin: Secondary | ICD-10-CM | POA: Diagnosis not present

## 2016-12-29 DIAGNOSIS — N186 End stage renal disease: Secondary | ICD-10-CM | POA: Diagnosis not present

## 2016-12-29 DIAGNOSIS — D631 Anemia in chronic kidney disease: Secondary | ICD-10-CM | POA: Diagnosis not present

## 2016-12-31 DIAGNOSIS — N186 End stage renal disease: Secondary | ICD-10-CM | POA: Diagnosis not present

## 2016-12-31 DIAGNOSIS — N2581 Secondary hyperparathyroidism of renal origin: Secondary | ICD-10-CM | POA: Diagnosis not present

## 2016-12-31 DIAGNOSIS — D631 Anemia in chronic kidney disease: Secondary | ICD-10-CM | POA: Diagnosis not present

## 2017-01-02 DIAGNOSIS — N2581 Secondary hyperparathyroidism of renal origin: Secondary | ICD-10-CM | POA: Diagnosis not present

## 2017-01-02 DIAGNOSIS — D631 Anemia in chronic kidney disease: Secondary | ICD-10-CM | POA: Diagnosis not present

## 2017-01-02 DIAGNOSIS — N186 End stage renal disease: Secondary | ICD-10-CM | POA: Diagnosis not present

## 2017-01-05 DIAGNOSIS — N186 End stage renal disease: Secondary | ICD-10-CM | POA: Diagnosis not present

## 2017-01-05 DIAGNOSIS — D631 Anemia in chronic kidney disease: Secondary | ICD-10-CM | POA: Diagnosis not present

## 2017-01-05 DIAGNOSIS — N2581 Secondary hyperparathyroidism of renal origin: Secondary | ICD-10-CM | POA: Diagnosis not present

## 2017-01-06 ENCOUNTER — Other Ambulatory Visit: Payer: Self-pay | Admitting: Internal Medicine

## 2017-01-06 DIAGNOSIS — B20 Human immunodeficiency virus [HIV] disease: Secondary | ICD-10-CM

## 2017-01-08 DIAGNOSIS — D631 Anemia in chronic kidney disease: Secondary | ICD-10-CM | POA: Diagnosis not present

## 2017-01-08 DIAGNOSIS — N2581 Secondary hyperparathyroidism of renal origin: Secondary | ICD-10-CM | POA: Diagnosis not present

## 2017-01-08 DIAGNOSIS — N186 End stage renal disease: Secondary | ICD-10-CM | POA: Diagnosis not present

## 2017-01-09 DIAGNOSIS — D631 Anemia in chronic kidney disease: Secondary | ICD-10-CM | POA: Diagnosis not present

## 2017-01-09 DIAGNOSIS — N2581 Secondary hyperparathyroidism of renal origin: Secondary | ICD-10-CM | POA: Diagnosis not present

## 2017-01-09 DIAGNOSIS — N186 End stage renal disease: Secondary | ICD-10-CM | POA: Diagnosis not present

## 2017-01-12 DIAGNOSIS — N186 End stage renal disease: Secondary | ICD-10-CM | POA: Diagnosis not present

## 2017-01-12 DIAGNOSIS — D631 Anemia in chronic kidney disease: Secondary | ICD-10-CM | POA: Diagnosis not present

## 2017-01-12 DIAGNOSIS — N2581 Secondary hyperparathyroidism of renal origin: Secondary | ICD-10-CM | POA: Diagnosis not present

## 2017-01-14 DIAGNOSIS — D631 Anemia in chronic kidney disease: Secondary | ICD-10-CM | POA: Diagnosis not present

## 2017-01-14 DIAGNOSIS — N2581 Secondary hyperparathyroidism of renal origin: Secondary | ICD-10-CM | POA: Diagnosis not present

## 2017-01-14 DIAGNOSIS — N186 End stage renal disease: Secondary | ICD-10-CM | POA: Diagnosis not present

## 2017-01-16 DIAGNOSIS — N2581 Secondary hyperparathyroidism of renal origin: Secondary | ICD-10-CM | POA: Diagnosis not present

## 2017-01-16 DIAGNOSIS — N186 End stage renal disease: Secondary | ICD-10-CM | POA: Diagnosis not present

## 2017-01-16 DIAGNOSIS — D631 Anemia in chronic kidney disease: Secondary | ICD-10-CM | POA: Diagnosis not present

## 2017-01-17 DIAGNOSIS — Z992 Dependence on renal dialysis: Secondary | ICD-10-CM | POA: Diagnosis not present

## 2017-01-17 DIAGNOSIS — N186 End stage renal disease: Secondary | ICD-10-CM | POA: Diagnosis not present

## 2017-01-17 DIAGNOSIS — I120 Hypertensive chronic kidney disease with stage 5 chronic kidney disease or end stage renal disease: Secondary | ICD-10-CM | POA: Diagnosis not present

## 2017-01-19 DIAGNOSIS — N186 End stage renal disease: Secondary | ICD-10-CM | POA: Diagnosis not present

## 2017-01-19 DIAGNOSIS — D631 Anemia in chronic kidney disease: Secondary | ICD-10-CM | POA: Diagnosis not present

## 2017-01-19 DIAGNOSIS — N2581 Secondary hyperparathyroidism of renal origin: Secondary | ICD-10-CM | POA: Diagnosis not present

## 2017-01-21 DIAGNOSIS — N186 End stage renal disease: Secondary | ICD-10-CM | POA: Diagnosis not present

## 2017-01-21 DIAGNOSIS — N2581 Secondary hyperparathyroidism of renal origin: Secondary | ICD-10-CM | POA: Diagnosis not present

## 2017-01-21 DIAGNOSIS — D631 Anemia in chronic kidney disease: Secondary | ICD-10-CM | POA: Diagnosis not present

## 2017-01-23 DIAGNOSIS — D631 Anemia in chronic kidney disease: Secondary | ICD-10-CM | POA: Diagnosis not present

## 2017-01-23 DIAGNOSIS — N2581 Secondary hyperparathyroidism of renal origin: Secondary | ICD-10-CM | POA: Diagnosis not present

## 2017-01-23 DIAGNOSIS — N186 End stage renal disease: Secondary | ICD-10-CM | POA: Diagnosis not present

## 2017-01-26 DIAGNOSIS — D631 Anemia in chronic kidney disease: Secondary | ICD-10-CM | POA: Diagnosis not present

## 2017-01-26 DIAGNOSIS — N2581 Secondary hyperparathyroidism of renal origin: Secondary | ICD-10-CM | POA: Diagnosis not present

## 2017-01-26 DIAGNOSIS — N186 End stage renal disease: Secondary | ICD-10-CM | POA: Diagnosis not present

## 2017-01-27 ENCOUNTER — Other Ambulatory Visit: Payer: Self-pay | Admitting: Internal Medicine

## 2017-01-28 DIAGNOSIS — N186 End stage renal disease: Secondary | ICD-10-CM | POA: Diagnosis not present

## 2017-01-28 DIAGNOSIS — N2581 Secondary hyperparathyroidism of renal origin: Secondary | ICD-10-CM | POA: Diagnosis not present

## 2017-01-28 DIAGNOSIS — D631 Anemia in chronic kidney disease: Secondary | ICD-10-CM | POA: Diagnosis not present

## 2017-01-30 DIAGNOSIS — N186 End stage renal disease: Secondary | ICD-10-CM | POA: Diagnosis not present

## 2017-01-30 DIAGNOSIS — N2581 Secondary hyperparathyroidism of renal origin: Secondary | ICD-10-CM | POA: Diagnosis not present

## 2017-01-30 DIAGNOSIS — D631 Anemia in chronic kidney disease: Secondary | ICD-10-CM | POA: Diagnosis not present

## 2017-02-03 DIAGNOSIS — D631 Anemia in chronic kidney disease: Secondary | ICD-10-CM | POA: Diagnosis not present

## 2017-02-03 DIAGNOSIS — N186 End stage renal disease: Secondary | ICD-10-CM | POA: Diagnosis not present

## 2017-02-03 DIAGNOSIS — N2581 Secondary hyperparathyroidism of renal origin: Secondary | ICD-10-CM | POA: Diagnosis not present

## 2017-02-04 DIAGNOSIS — D631 Anemia in chronic kidney disease: Secondary | ICD-10-CM | POA: Diagnosis not present

## 2017-02-04 DIAGNOSIS — N2581 Secondary hyperparathyroidism of renal origin: Secondary | ICD-10-CM | POA: Diagnosis not present

## 2017-02-04 DIAGNOSIS — N186 End stage renal disease: Secondary | ICD-10-CM | POA: Diagnosis not present

## 2017-02-06 DIAGNOSIS — N2581 Secondary hyperparathyroidism of renal origin: Secondary | ICD-10-CM | POA: Diagnosis not present

## 2017-02-06 DIAGNOSIS — N186 End stage renal disease: Secondary | ICD-10-CM | POA: Diagnosis not present

## 2017-02-06 DIAGNOSIS — D631 Anemia in chronic kidney disease: Secondary | ICD-10-CM | POA: Diagnosis not present

## 2017-02-07 ENCOUNTER — Other Ambulatory Visit: Payer: Self-pay | Admitting: Internal Medicine

## 2017-02-07 DIAGNOSIS — F411 Generalized anxiety disorder: Secondary | ICD-10-CM

## 2017-02-09 DIAGNOSIS — D631 Anemia in chronic kidney disease: Secondary | ICD-10-CM | POA: Diagnosis not present

## 2017-02-09 DIAGNOSIS — N2581 Secondary hyperparathyroidism of renal origin: Secondary | ICD-10-CM | POA: Diagnosis not present

## 2017-02-09 DIAGNOSIS — N186 End stage renal disease: Secondary | ICD-10-CM | POA: Diagnosis not present

## 2017-02-11 DIAGNOSIS — N186 End stage renal disease: Secondary | ICD-10-CM | POA: Diagnosis not present

## 2017-02-11 DIAGNOSIS — N2581 Secondary hyperparathyroidism of renal origin: Secondary | ICD-10-CM | POA: Diagnosis not present

## 2017-02-11 DIAGNOSIS — D631 Anemia in chronic kidney disease: Secondary | ICD-10-CM | POA: Diagnosis not present

## 2017-02-13 DIAGNOSIS — D631 Anemia in chronic kidney disease: Secondary | ICD-10-CM | POA: Diagnosis not present

## 2017-02-13 DIAGNOSIS — N2581 Secondary hyperparathyroidism of renal origin: Secondary | ICD-10-CM | POA: Diagnosis not present

## 2017-02-13 DIAGNOSIS — N186 End stage renal disease: Secondary | ICD-10-CM | POA: Diagnosis not present

## 2017-02-16 DIAGNOSIS — Z005 Encounter for examination of potential donor of organ and tissue: Secondary | ICD-10-CM | POA: Diagnosis not present

## 2017-02-16 DIAGNOSIS — D631 Anemia in chronic kidney disease: Secondary | ICD-10-CM | POA: Diagnosis not present

## 2017-02-16 DIAGNOSIS — N2581 Secondary hyperparathyroidism of renal origin: Secondary | ICD-10-CM | POA: Diagnosis not present

## 2017-02-16 DIAGNOSIS — N186 End stage renal disease: Secondary | ICD-10-CM | POA: Diagnosis not present

## 2017-02-17 DIAGNOSIS — I120 Hypertensive chronic kidney disease with stage 5 chronic kidney disease or end stage renal disease: Secondary | ICD-10-CM | POA: Diagnosis not present

## 2017-02-17 DIAGNOSIS — N186 End stage renal disease: Secondary | ICD-10-CM | POA: Diagnosis not present

## 2017-02-17 DIAGNOSIS — Z992 Dependence on renal dialysis: Secondary | ICD-10-CM | POA: Diagnosis not present

## 2017-02-18 DIAGNOSIS — Z9289 Personal history of other medical treatment: Secondary | ICD-10-CM

## 2017-02-18 DIAGNOSIS — N492 Inflammatory disorders of scrotum: Secondary | ICD-10-CM | POA: Diagnosis not present

## 2017-02-18 DIAGNOSIS — D631 Anemia in chronic kidney disease: Secondary | ICD-10-CM | POA: Diagnosis not present

## 2017-02-18 DIAGNOSIS — N186 End stage renal disease: Secondary | ICD-10-CM | POA: Diagnosis not present

## 2017-02-18 DIAGNOSIS — E8779 Other fluid overload: Secondary | ICD-10-CM | POA: Diagnosis not present

## 2017-02-18 DIAGNOSIS — N2581 Secondary hyperparathyroidism of renal origin: Secondary | ICD-10-CM | POA: Diagnosis not present

## 2017-02-18 HISTORY — DX: Personal history of other medical treatment: Z92.89

## 2017-02-20 DIAGNOSIS — N492 Inflammatory disorders of scrotum: Secondary | ICD-10-CM | POA: Diagnosis not present

## 2017-02-20 DIAGNOSIS — E8779 Other fluid overload: Secondary | ICD-10-CM | POA: Diagnosis not present

## 2017-02-20 DIAGNOSIS — N2581 Secondary hyperparathyroidism of renal origin: Secondary | ICD-10-CM | POA: Diagnosis not present

## 2017-02-20 DIAGNOSIS — D631 Anemia in chronic kidney disease: Secondary | ICD-10-CM | POA: Diagnosis not present

## 2017-02-20 DIAGNOSIS — N186 End stage renal disease: Secondary | ICD-10-CM | POA: Diagnosis not present

## 2017-02-23 DIAGNOSIS — E8779 Other fluid overload: Secondary | ICD-10-CM | POA: Diagnosis not present

## 2017-02-23 DIAGNOSIS — D631 Anemia in chronic kidney disease: Secondary | ICD-10-CM | POA: Diagnosis not present

## 2017-02-23 DIAGNOSIS — N492 Inflammatory disorders of scrotum: Secondary | ICD-10-CM | POA: Diagnosis not present

## 2017-02-23 DIAGNOSIS — N2581 Secondary hyperparathyroidism of renal origin: Secondary | ICD-10-CM | POA: Diagnosis not present

## 2017-02-23 DIAGNOSIS — N186 End stage renal disease: Secondary | ICD-10-CM | POA: Diagnosis not present

## 2017-02-24 DIAGNOSIS — N186 End stage renal disease: Secondary | ICD-10-CM | POA: Diagnosis not present

## 2017-02-24 DIAGNOSIS — N2581 Secondary hyperparathyroidism of renal origin: Secondary | ICD-10-CM | POA: Diagnosis not present

## 2017-02-24 DIAGNOSIS — N492 Inflammatory disorders of scrotum: Secondary | ICD-10-CM | POA: Diagnosis not present

## 2017-02-24 DIAGNOSIS — E8779 Other fluid overload: Secondary | ICD-10-CM | POA: Diagnosis not present

## 2017-02-24 DIAGNOSIS — D631 Anemia in chronic kidney disease: Secondary | ICD-10-CM | POA: Diagnosis not present

## 2017-02-27 DIAGNOSIS — E8779 Other fluid overload: Secondary | ICD-10-CM | POA: Diagnosis not present

## 2017-02-27 DIAGNOSIS — N2581 Secondary hyperparathyroidism of renal origin: Secondary | ICD-10-CM | POA: Diagnosis not present

## 2017-02-27 DIAGNOSIS — D631 Anemia in chronic kidney disease: Secondary | ICD-10-CM | POA: Diagnosis not present

## 2017-02-27 DIAGNOSIS — N186 End stage renal disease: Secondary | ICD-10-CM | POA: Diagnosis not present

## 2017-02-27 DIAGNOSIS — N492 Inflammatory disorders of scrotum: Secondary | ICD-10-CM | POA: Diagnosis not present

## 2017-02-28 ENCOUNTER — Emergency Department (HOSPITAL_COMMUNITY): Payer: Medicare Other

## 2017-02-28 ENCOUNTER — Emergency Department (HOSPITAL_COMMUNITY)
Admission: EM | Admit: 2017-02-28 | Discharge: 2017-02-28 | Disposition: A | Discharge status: Home / Self Care | Payer: Medicare Other | Attending: Emergency Medicine | Admitting: Emergency Medicine

## 2017-02-28 ENCOUNTER — Encounter (HOSPITAL_COMMUNITY): Payer: Self-pay | Admitting: Emergency Medicine

## 2017-02-28 DIAGNOSIS — N50811 Right testicular pain: Secondary | ICD-10-CM | POA: Diagnosis not present

## 2017-02-28 DIAGNOSIS — Z87891 Personal history of nicotine dependence: Secondary | ICD-10-CM | POA: Diagnosis not present

## 2017-02-28 DIAGNOSIS — E785 Hyperlipidemia, unspecified: Secondary | ICD-10-CM | POA: Diagnosis not present

## 2017-02-28 DIAGNOSIS — B20 Human immunodeficiency virus [HIV] disease: Secondary | ICD-10-CM | POA: Insufficient documentation

## 2017-02-28 DIAGNOSIS — N492 Inflammatory disorders of scrotum: Secondary | ICD-10-CM | POA: Diagnosis not present

## 2017-02-28 DIAGNOSIS — R030 Elevated blood-pressure reading, without diagnosis of hypertension: Secondary | ICD-10-CM | POA: Diagnosis not present

## 2017-02-28 DIAGNOSIS — I509 Heart failure, unspecified: Secondary | ICD-10-CM | POA: Insufficient documentation

## 2017-02-28 DIAGNOSIS — Z23 Encounter for immunization: Secondary | ICD-10-CM | POA: Insufficient documentation

## 2017-02-28 DIAGNOSIS — D649 Anemia, unspecified: Secondary | ICD-10-CM | POA: Insufficient documentation

## 2017-02-28 DIAGNOSIS — Z79899 Other long term (current) drug therapy: Secondary | ICD-10-CM | POA: Insufficient documentation

## 2017-02-28 DIAGNOSIS — I110 Hypertensive heart disease with heart failure: Secondary | ICD-10-CM | POA: Diagnosis not present

## 2017-02-28 DIAGNOSIS — I120 Hypertensive chronic kidney disease with stage 5 chronic kidney disease or end stage renal disease: Secondary | ICD-10-CM | POA: Diagnosis not present

## 2017-02-28 DIAGNOSIS — Z992 Dependence on renal dialysis: Secondary | ICD-10-CM | POA: Insufficient documentation

## 2017-02-28 DIAGNOSIS — N186 End stage renal disease: Secondary | ICD-10-CM | POA: Insufficient documentation

## 2017-02-28 LAB — CBC WITH DIFFERENTIAL/PLATELET
Basophils Absolute: 0 10*3/uL (ref 0.0–0.1)
Basophils Relative: 0 %
EOS PCT: 1 %
Eosinophils Absolute: 0.1 10*3/uL (ref 0.0–0.7)
HCT: 28.9 % — ABNORMAL LOW (ref 39.0–52.0)
Hemoglobin: 9.4 g/dL — ABNORMAL LOW (ref 13.0–17.0)
LYMPHS ABS: 1.6 10*3/uL (ref 0.7–4.0)
LYMPHS PCT: 16 %
MCH: 29.4 pg (ref 26.0–34.0)
MCHC: 32.5 g/dL (ref 30.0–36.0)
MCV: 90.3 fL (ref 78.0–100.0)
MONO ABS: 0.8 10*3/uL (ref 0.1–1.0)
Monocytes Relative: 9 %
Neutro Abs: 7.3 10*3/uL (ref 1.7–7.7)
Neutrophils Relative %: 74 %
PLATELETS: 155 10*3/uL (ref 150–400)
RBC: 3.2 MIL/uL — ABNORMAL LOW (ref 4.22–5.81)
RDW: 16.6 % — AB (ref 11.5–15.5)
WBC: 9.8 10*3/uL (ref 4.0–10.5)

## 2017-02-28 LAB — COMPREHENSIVE METABOLIC PANEL
ALBUMIN: 3.2 g/dL — AB (ref 3.5–5.0)
ALT: 17 U/L (ref 17–63)
AST: 20 U/L (ref 15–41)
Alkaline Phosphatase: 130 U/L — ABNORMAL HIGH (ref 38–126)
Anion gap: 16 — ABNORMAL HIGH (ref 5–15)
BILIRUBIN TOTAL: 1 mg/dL (ref 0.3–1.2)
BUN: 38 mg/dL — AB (ref 6–20)
CHLORIDE: 93 mmol/L — AB (ref 101–111)
CO2: 24 mmol/L (ref 22–32)
CREATININE: 11.76 mg/dL — AB (ref 0.61–1.24)
Calcium: 9.6 mg/dL (ref 8.9–10.3)
GFR calc Af Amer: 5 mL/min — ABNORMAL LOW (ref >60)
GFR calc non Af Amer: 4 mL/min — ABNORMAL LOW (ref >60)
GLUCOSE: 106 mg/dL — AB (ref 65–99)
POTASSIUM: 3.8 mmol/L (ref 3.5–5.1)
Sodium: 133 mmol/L — ABNORMAL LOW (ref 135–145)
Total Protein: 7.5 g/dL (ref 6.5–8.1)

## 2017-02-28 LAB — I-STAT CG4 LACTIC ACID, ED: LACTIC ACID, VENOUS: 1.16 mmol/L (ref 0.5–1.9)

## 2017-02-28 MED ORDER — DOXYCYCLINE HYCLATE 100 MG PO TABS
100.0000 mg | ORAL_TABLET | Freq: Once | ORAL | Status: AC
  Administered 2017-02-28: 100 mg via ORAL
  Filled 2017-02-28: qty 1

## 2017-02-28 MED ORDER — HYDROCODONE-ACETAMINOPHEN 5-325 MG PO TABS: 1.0000 | ORAL_TABLET | Freq: Four times a day (QID) | ORAL | 0 refills | Status: DC | PRN

## 2017-02-28 MED ORDER — DOXYCYCLINE HYCLATE 100 MG PO CAPS: 100.0000 mg | ORAL_CAPSULE | Freq: Two times a day (BID) | ORAL | 0 refills | Status: DC

## 2017-02-28 MED ORDER — HYDROCODONE-ACETAMINOPHEN 5-325 MG PO TABS
2.0000 | ORAL_TABLET | Freq: Once | ORAL | Status: AC
  Administered 2017-02-28: 2 via ORAL
  Filled 2017-02-28: qty 2

## 2017-02-28 MED ORDER — LEVOFLOXACIN 500 MG PO TABS: ORAL_TABLET | ORAL | 0 refills | Status: DC

## 2017-02-28 MED ORDER — TETANUS-DIPHTH-ACELL PERTUSSIS 5-2.5-18.5 LF-MCG/0.5 IM SUSP
0.5000 mL | Freq: Once | INTRAMUSCULAR | Status: AC
  Administered 2017-02-28: 0.5 mL via INTRAMUSCULAR
  Filled 2017-02-28: qty 0.5

## 2017-02-28 MED ORDER — LEVOFLOXACIN 500 MG PO TABS
500.0000 mg | ORAL_TABLET | Freq: Once | ORAL | Status: AC
  Administered 2017-02-28: 500 mg via ORAL
  Filled 2017-02-28 (×2): qty 1

## 2017-02-28 NOTE — Discharge Instructions (Signed)
Sit in warm bathtub 4 times daily for 30 minutes at a time. Take Tylenol for mild pain and the pain medicine prescribed for bad pain. Don't take Tylenol together with the pain medicine prescribed as the combination can be dangerous to your liver. Call [facility] office on Monday, 03/02/2017 to schedule an appointment for within the next 2 weeks. Get your blood pressure recheck within the next one week. Today's was elevated at 167/73

## 2017-02-28 NOTE — ED Provider Notes (Signed)
[facility] [place] DEPT Provider Note   CSN: [id number] Arrival date & time: 02/28/17  0803     History   Chief Complaint Chief Complaint  Patient presents with  . Abscess    HPI Darrell Johnson is a 51 y.o. male.  HPI complains of painful area at base of scrotum radiating to right testicle onset 5 days ago. He reports subjective fever 5 days ago which has since resolved he feels improved. He's noticed area draining at base of penis. And he feels swollen in his right testicle. Pain is worse with pressing on the area. He's had no nausea or vomiting. No other associated symptoms. Treated himself with Tylenol a few days ago without relief.  Past Medical History:  Diagnosis Date  . Anemia   . Arthritis   . CHF (congestive heart failure) ([place])   . Chronic kidney disease    stage 3-4 CKD, followed by Dr. Moshe Cipro  . ESRD (end stage renal disease) ([place])    ESRD due to HTN started dialysis Feb 2016  . Gout   . Gout, unspecified 08/13/2009   Qualifier: Diagnosis of  By: Redmond Pulling  MD, Mateo Flow    . H1N1 influenza    March 2016  . History of gout   . History of syphilis   . HIV (human immunodeficiency virus infection) ([place])   . HIV infection ([place]) 1980's   on ART therapy since, followed by ID clinic, complicated  by neuropathy  . HTN (hypertension)   . Hyperlipidemia    hypertrygliceridemia determined ti be secondary to ART therpay  . Hypertension   . HYPERTENSION 05/08/2006  . 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 Pneumonia   . Rib fractures 01/2009  . Seizure disorder ([place])   . Seizures ([place])    last seizure was >5 years ago, pt has family history of seizures  . 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 Syphilis 1997   history of syphilis 1997  . Tobacco abuse     Patient Active Problem List   Diagnosis Date Noted  . Trigger finger of  right hand 09/11/2016  . Abscess of groin, right 09/04/2016  . Hyperlipidemia 03/31/2016  . Anxiety state 03/11/2016  . Lumbar radiculopathy 01/28/2016  . Seasonal allergies   . Healthcare maintenance 05/03/2015  . Anemia in chronic kidney disease 08/30/2014  . End stage renal disease ([place]) 12/27/2013  . Drug noncompliance 11/07/2013  . History of syphilis 11/07/2013  . Arthritis 09/09/2013  . Tobacco abuse 09/07/2013  . Chronic pain disorder 04/28/2013  . HYPERTRIGLYCERIDEMIA 11/01/2009  . Gout 08/13/2009  . ERECTILE DYSFUNCTION 06/08/2008  . Human immunodeficiency virus (HIV) disease ([place]) 05/08/2006  . PERIPHERAL NEUROPATHY 05/08/2006  . Essential hypertension 05/08/2006  . SEIZURE DISORDER 05/08/2006    Past Surgical History:  Procedure Laterality Date  . ABSCESS DRAINAGE    . AV FISTULA PLACEMENT Right 12/02/2013   Procedure: ARTERIOVENOUS (AV) FISTULA CREATION;  Surgeon: Rosetta Posner, MD;  Location: [facility] OR;  Service: Vascular;  Laterality: Right;       Home Medications    Prior to Admission medications   Medication Sig Start Date End Date Taking? Authorizing Provider  albuterol (PROVENTIL HFA;VENTOLIN HFA) 108 (90 Base) MCG/ACT inhaler Inhale 2 puffs into the lungs every 6 (six) hours as needed for wheezing or shortness of breath. 10/02/16   Jule Ser,  DO  albuterol (PROVENTIL) (2.5 MG/3ML) 0.083% nebulizer solution Take 3 mLs (2.5 mg total) by nebulization every 6 (six) hours as needed for wheezing or shortness of breath. 09/04/16   Jule Ser, DO  amLODipine (NORVASC) 10 MG tablet Take 10 mg by mouth daily.    [provider]  buPROPion [facility]) 75 MG tablet TAKE 1/2 TABLET TWICE A DAY 02/10/17   Jule Ser, DO  calcitRIOL (ROCALTROL) 0.5 MCG capsule Take by mouth.    [provider]  carvedilol (COREG) 25 MG tablet TAKE 1 TABLET (25 MG TOTAL) BY MOUTH 2 (TWO) TIMES DAILY WITH A MEAL. 09/11/16   Jule Ser, DO  doxazosin  (CARDURA) 8 MG tablet TAKE 1 TABLET (8 MG TOTAL) BY MOUTH AT BEDTIME. 09/11/16   Jule Ser, DO  EDURANT 25 MG TABS tablet TAKE 1 TABLET (25 MG TOTAL) BY MOUTH DAILY WITH BREAKFAST. 12/22/16   Carlyle Basques, MD  Febuxostat (ULORIC) 80 MG TABS Take 80 mg by mouth daily as needed (gout).     [provider]  furosemide (LASIX) 40 MG tablet Take 1 tablet (40 mg total) by mouth daily. 09/11/16   Jule Ser, DO  hydrALAZINE (APRESOLINE) 25 MG tablet Take 1 tablet (25 mg total) by mouth 3 (three) times daily. 11/24/16   Jule Ser, DO  montelukast (SINGULAIR) 10 MG tablet Take 1 tablet (10 mg total) by mouth at bedtime. 12/17/16   Jule Ser, DO  pravastatin (PRAVACHOL) 40 MG tablet TAKE 1 TABLET (40 MG TOTAL) BY MOUTH EVERY EVENING. 01/27/17 01/27/18  Jule Ser, DO  sevelamer carbonate (RENVELA) 800 MG tablet Take 2,400 mg by mouth See admin instructions. Take 3 tablets (2400 mg) by mouth with meals and snacks    [provider]  TIVICAY 50 MG tablet TAKE 1 TABLET (50 MG TOTAL) BY MOUTH DAILY. 12/22/16   Carlyle Basques, MD  VIREAD 300 MG tablet TAKE 1 TABLET BY MOUTH ONCE A WEEK 01/06/17   Carlyle Basques, MD    Family History Family History  Problem Relation Age of Onset  . Cancer Mother   . Hypertension Mother   . COPD Father   . Hypertension Father   . Diabetes Sister   . Hypertension Sister   . Diabetes Brother   . Hypertension Brother   . Stroke Neg Hx     Social History Social History  Substance Use Topics  . Smoking status: Former Smoker    Packs/day: 0.40    Years: 20.00    Start date: 01/10/2016    Quit date: 04/07/2016  . Smokeless tobacco: Never Used  . Alcohol use No   No illicit drug use  Allergies   Patient has no known allergies.   Review of Systems Review of Systems  Constitutional: Positive for fever.       Subjective fever 5 days ago resolved  HENT: Negative.   Respiratory: Negative.   Cardiovascular: Negative.     Gastrointestinal: Negative.   Genitourinary: Positive for scrotal swelling and testicular pain.  Musculoskeletal: Negative.   Skin: Negative.   Allergic/Immunologic: Positive for immunocompromised state.       Dialysis patient  Neurological: Negative.   Psychiatric/Behavioral: Negative.   All other systems reviewed and are negative.    Physical Exam Updated Vital Signs BP 130/69 (BP Location: Right Arm)   Pulse 84   Temp 98.8 F (37.1 C) (Oral)   Resp 18   SpO2 100%   Physical Exam  Constitutional: He appears well-developed and well-nourished.  He appears distressed.  HENT:  Head: Normocephalic and atraumatic.  Eyes: Pupils are equal, round, and reactive to light. Conjunctivae are normal.  Neck: Neck supple. No tracheal deviation present. No thyromegaly present.  Cardiovascular: Normal rate and regular rhythm.   No murmur heard. Pulmonary/Chest: Effort normal and breath sounds normal.  Abdominal: Soft. Bowel sounds are normal. He exhibits no distension. There is no tenderness.  Genitourinary: Penis normal.  Genitourinary Comments: Scrotum with tiny 2 mm area at base which is draining beige pus. No surrounding redness. Testes grossly normal to palpation, no swelling or redness of scrotum  Musculoskeletal: Normal range of motion. He exhibits no edema or tenderness.  Right upper extremity with dialysis fistula with good thrill. All 4 extremity is neurovascularly intact  Neurological: He is alert. Coordination normal.  Skin: Skin is warm and dry. No rash noted.  Psychiatric: He has a normal mood and affect.  Nursing note and vitals reviewed.    ED Treatments / Results  Labs (all labs ordered are listed, but only abnormal results are displayed) Labs Reviewed  CBC WITH DIFFERENTIAL/PLATELET - Abnormal; Notable for the following:       Result Value   RBC 3.20 (*)    Hemoglobin 9.4 (*)    HCT 28.9 (*)    RDW 16.6 (*)    All other components within normal limits   COMPREHENSIVE METABOLIC PANEL  I-STAT CG4 LACTIC ACID, ED    EKG  EKG Interpretation None       Radiology No results found.  Procedures Procedures (including critical care time)  Medications Ordered in ED Medications  HYDROcodone-acetaminophen (NORCO/VICODIN) 5-325 MG per tablet 2 tablet (not administered)  Tdap (BOOSTRIX) injection 0.5 mL (not administered)   Results for orders placed or performed during the hospital encounter of 02/28/17  Comprehensive metabolic panel  Result Value Ref Range   Sodium 133 (L) 135 - 145 mmol/L   Potassium 3.8 3.5 - 5.1 mmol/L   Chloride 93 (L) 101 - 111 mmol/L   CO2 24 22 - 32 mmol/L   Glucose, Bld 106 (H) 65 - 99 mg/dL   BUN 38 (H) 6 - 20 mg/dL   Creatinine, Ser 11.76 (H) 0.61 - 1.24 mg/dL   Calcium 9.6 8.9 - 10.3 mg/dL   Total Protein 7.5 6.5 - 8.1 g/dL   Albumin 3.2 (L) 3.5 - 5.0 g/dL   AST 20 15 - 41 U/L   ALT 17 17 - 63 U/L   Alkaline Phosphatase 130 (H) 38 - 126 U/L   Total Bilirubin 1.0 0.3 - 1.2 mg/dL   GFR calc non Af Amer 4 (L) >60 mL/min   GFR calc Af Amer 5 (L) >60 mL/min   Anion gap 16 (H) 5 - 15  CBC with Differential  Result Value Ref Range   WBC 9.8 4.0 - 10.5 K/uL   RBC 3.20 (L) 4.22 - 5.81 MIL/uL   Hemoglobin 9.4 (L) 13.0 - 17.0 g/dL   HCT 28.9 (L) 39.0 - 52.0 %   MCV 90.3 78.0 - 100.0 fL   MCH 29.4 26.0 - 34.0 pg   MCHC 32.5 30.0 - 36.0 g/dL   RDW 16.6 (H) 11.5 - 15.5 %   Platelets 155 150 - 400 K/uL   Neutrophils Relative % 74 %   Neutro Abs 7.3 1.7 - 7.7 K/uL   Lymphocytes Relative 16 %   Lymphs Abs 1.6 0.7 - 4.0 K/uL   Monocytes Relative 9 %   Monocytes Absolute 0.8 0.1 -  1.0 K/uL   Eosinophils Relative 1 %   Eosinophils Absolute 0.1 0.0 - 0.7 K/uL   Basophils Relative 0 %   Basophils Absolute 0.0 0.0 - 0.1 K/uL  I-Stat CG4 Lactic Acid, ED  Result Value Ref Range   Lactic Acid, Venous 1.16 0.5 - 1.9 mmol/L   US Scrotum  Result Date: 02/28/2017 CLINICAL DATA:  RIGHT testicular pain for 3  days, question tiny scrotal abscess EXAM: ULTRASOUND OF SCROTUM TECHNIQUE: Complete ultrasound examination of the testicles, epididymis, and other scrotal structures was performed. Duplex sonography of the testes not ordered. COMPARISON:  10/14/2005 FINDINGS: Right testicle Measurements: 4.1 x 2.3 x 2.5 cm. Normal morphology without mass. Internal blood flow present on color Doppler imaging. Left testicle Measurements: 4.1 x 2.1 x 3.0 cm. Minimally heterogeneous. No focal mass. Internal blood flow present on color Doppler imaging symmetric with RIGHT. Right epididymis: Normal in size. Tiny cyst at epididymal head 3 x 3 mm. Left epididymis: Normal in size. Tiny cyst at epididymal tail 6 x 4 x 3 mm. Hydrocele: Small septated/loculated RIGHT hydrocele. No LEFT hydrocele Varicocele:  No gross varicocele identified Other: Sonography performed at the area of erythema lateral to the RIGHT scrotum demonstrates skin thickening and subcutaneous edema. Tiny RIGHT inguinal lymph node noted. No discrete fluid collection seen at this site to suggest abscess. IMPRESSION: Small loculated RIGHT hydrocele, cannot exclude pyocele. Scattered subcutaneous edema and skin thickening at the site of clinical concern lateral to the RIGHT hemiscrotum without visualization of a discrete abscess. Electronically Signed   By: Lavonia Dana M.D.   On: 02/28/2017 10:47   Results for orders placed or performed during the hospital encounter of 02/28/17  Comprehensive metabolic panel  Result Value Ref Range   Sodium 133 (L) 135 - 145 mmol/L   Potassium 3.8 3.5 - 5.1 mmol/L   Chloride 93 (L) 101 - 111 mmol/L   CO2 24 22 - 32 mmol/L   Glucose, Bld 106 (H) 65 - 99 mg/dL   BUN 38 (H) 6 - 20 mg/dL   Creatinine, Ser 11.76 (H) 0.61 - 1.24 mg/dL   Calcium 9.6 8.9 - 10.3 mg/dL   Total Protein 7.5 6.5 - 8.1 g/dL   Albumin 3.2 (L) 3.5 - 5.0 g/dL   AST 20 15 - 41 U/L   ALT 17 17 - 63 U/L   Alkaline Phosphatase 130 (H) 38 - 126 U/L   Total  Bilirubin 1.0 0.3 - 1.2 mg/dL   GFR calc non Af Amer 4 (L) >60 mL/min   GFR calc Af Amer 5 (L) >60 mL/min   Anion gap 16 (H) 5 - 15  CBC with Differential  Result Value Ref Range   WBC 9.8 4.0 - 10.5 K/uL   RBC 3.20 (L) 4.22 - 5.81 MIL/uL   Hemoglobin 9.4 (L) 13.0 - 17.0 g/dL   HCT 28.9 (L) 39.0 - 52.0 %   MCV 90.3 78.0 - 100.0 fL   MCH 29.4 26.0 - 34.0 pg   MCHC 32.5 30.0 - 36.0 g/dL   RDW 16.6 (H) 11.5 - 15.5 %   Platelets 155 150 - 400 K/uL   Neutrophils Relative % 74 %   Neutro Abs 7.3 1.7 - 7.7 K/uL   Lymphocytes Relative 16 %   Lymphs Abs 1.6 0.7 - 4.0 K/uL   Monocytes Relative 9 %   Monocytes Absolute 0.8 0.1 - 1.0 K/uL   Eosinophils Relative 1 %   Eosinophils Absolute 0.1 0.0 - 0.7 K/uL  Basophils Relative 0 %   Basophils Absolute 0.0 0.0 - 0.1 K/uL  I-Stat CG4 Lactic Acid, ED  Result Value Ref Range   Lactic Acid, Venous 1.16 0.5 - 1.9 mmol/L   US Scrotum  Result Date: 02/28/2017 CLINICAL DATA:  RIGHT testicular pain for 3 days, question tiny scrotal abscess EXAM: ULTRASOUND OF SCROTUM TECHNIQUE: Complete ultrasound examination of the testicles, epididymis, and other scrotal structures was performed. Duplex sonography of the testes not ordered. COMPARISON:  10/14/2005 FINDINGS: Right testicle Measurements: 4.1 x 2.3 x 2.5 cm. Normal morphology without mass. Internal blood flow present on color Doppler imaging. Left testicle Measurements: 4.1 x 2.1 x 3.0 cm. Minimally heterogeneous. No focal mass. Internal blood flow present on color Doppler imaging symmetric with RIGHT. Right epididymis: Normal in size. Tiny cyst at epididymal head 3 x 3 mm. Left epididymis: Normal in size. Tiny cyst at epididymal tail 6 x 4 x 3 mm. Hydrocele: Small septated/loculated RIGHT hydrocele. No LEFT hydrocele Varicocele:  No gross varicocele identified Other: Sonography performed at the area of erythema lateral to the RIGHT scrotum demonstrates skin thickening and subcutaneous edema. Tiny RIGHT  inguinal lymph node noted. No discrete fluid collection seen at this site to suggest abscess. IMPRESSION: Small loculated RIGHT hydrocele, cannot exclude pyocele. Scattered subcutaneous edema and skin thickening at the site of clinical concern lateral to the RIGHT hemiscrotum without visualization of a discrete abscess. Electronically Signed   By: Lavonia Dana M.D.   On: 02/28/2017 10:47   Initial Impression / Assessment and Plan / ED Course  I have reviewed the triage vital signs and the nursing notes.  Pertinent labs & imaging results that were available during my care of the patient were reviewed by me and considered in my medical decision making (see chart for details).     11:20 AM pain improved after treatment with Norco. I discussed case with Dr.Filippou who reviewed the ultrasound images. She does not see anything to drain further. Scrotal abscess is draining spontaneously.  Plan prescriptions Norco, doxycycline, Levaquin. Warm soaks, follow-up at [facility] within 2 weeks. Anemia is chronic. [place] Controlled Substance reporting System queried. Blood pressure recheck one week Final Clinical Impressions(s) / ED Diagnoses  Diagnosis #1 scrotal abscess #2 anemia Final diagnoses:  None   #3 elevated blood pressure. New Prescriptions New Prescriptions   No medications on file     Orlie Dakin, MD 02/28/17 1134

## 2017-02-28 NOTE — ED Triage Notes (Signed)
Pt to ER for evaluation of perineal abscess that presented Monday. States felt febrile at home. Reports hx of same that needed surgical intervention to drain in the past. Pt is dialysis patient, MWF, last full tx yesterday. Reports significant pain with having BM.

## 2017-03-02 DIAGNOSIS — E8779 Other fluid overload: Secondary | ICD-10-CM | POA: Diagnosis not present

## 2017-03-02 DIAGNOSIS — N492 Inflammatory disorders of scrotum: Secondary | ICD-10-CM | POA: Diagnosis not present

## 2017-03-02 DIAGNOSIS — D631 Anemia in chronic kidney disease: Secondary | ICD-10-CM | POA: Diagnosis not present

## 2017-03-02 DIAGNOSIS — N186 End stage renal disease: Secondary | ICD-10-CM | POA: Diagnosis not present

## 2017-03-02 DIAGNOSIS — N2581 Secondary hyperparathyroidism of renal origin: Secondary | ICD-10-CM | POA: Diagnosis not present

## 2017-03-04 DIAGNOSIS — N492 Inflammatory disorders of scrotum: Secondary | ICD-10-CM | POA: Diagnosis not present

## 2017-03-04 DIAGNOSIS — N186 End stage renal disease: Secondary | ICD-10-CM | POA: Diagnosis not present

## 2017-03-04 DIAGNOSIS — E8779 Other fluid overload: Secondary | ICD-10-CM | POA: Diagnosis not present

## 2017-03-04 DIAGNOSIS — D631 Anemia in chronic kidney disease: Secondary | ICD-10-CM | POA: Diagnosis not present

## 2017-03-04 DIAGNOSIS — N2581 Secondary hyperparathyroidism of renal origin: Secondary | ICD-10-CM | POA: Diagnosis not present

## 2017-03-05 ENCOUNTER — Ambulatory Visit (INDEPENDENT_AMBULATORY_CARE_PROVIDER_SITE_OTHER): Payer: PRIVATE HEALTH INSURANCE | Admitting: Internal Medicine

## 2017-03-05 ENCOUNTER — Encounter: Payer: Self-pay | Admitting: Internal Medicine

## 2017-03-05 VITALS — BP 168/77 | HR 83 | Temp 98.2°F | Ht 69.0 in | Wt 260.8 lb

## 2017-03-05 DIAGNOSIS — L02214 Cutaneous abscess of groin: Secondary | ICD-10-CM | POA: Diagnosis present

## 2017-03-05 DIAGNOSIS — Z87891 Personal history of nicotine dependence: Secondary | ICD-10-CM | POA: Diagnosis not present

## 2017-03-05 MED ORDER — HYDROCODONE-ACETAMINOPHEN 5-325 MG PO TABS: 1.0000 | ORAL_TABLET | Freq: Four times a day (QID) | ORAL | 0 refills | Status: DC | PRN

## 2017-03-05 NOTE — Progress Notes (Signed)
   CC: Right scrotal pain  HPI:  Darrell Johnson is a 51 y.o. male with PMH as described below who presents to clinic with right scrotal pain. Please see problem based assessment and plan for further details.  Past Medical History:  Diagnosis Date  . Anemia   . Arthritis   . CHF (congestive heart failure) ([place])   . Chronic kidney disease    stage 3-4 CKD, followed by Dr. Moshe Cipro  . ESRD (end stage renal disease) ([place])    ESRD due to HTN started dialysis Feb 2016  . Gout   . Gout, unspecified 08/13/2009   Qualifier: Diagnosis of  By: Redmond Pulling  MD, Mateo Flow    . H1N1 influenza    March 2016  . History of gout   . History of syphilis   . HIV (human immunodeficiency virus infection) ([place])   . HIV infection ([place]) 1980's   on ART therapy since, followed by ID clinic, complicated  by neuropathy  . HTN (hypertension)   . Hyperlipidemia    hypertrygliceridemia determined ti be secondary to ART therpay  . Hypertension   . HYPERTENSION 05/08/2006  . 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 Pneumonia   . Rib fractures 01/2009  . Seizure disorder ([place])   . Seizures ([place])    last seizure was >5 years ago, pt has family history of seizures  . 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 Syphilis 1997   history of syphilis 1997  . Tobacco abuse    Review of Systems:   Review of Systems  Constitutional: Negative for chills, diaphoresis, fever and malaise/fatigue.  Gastrointestinal: Negative for abdominal pain.  Genitourinary:       Scrotal pain present   Skin: Negative for itching and rash.     Physical Exam:  Vitals:   03/05/17 1341  BP: (!) 168/77  Pulse: 83  Temp: 98.2 F (36.8 C)  TempSrc: Oral  SpO2: 98%  Weight: 260 lb 12.8 oz (118.3 kg)  Height: 5\' 9"  (1.753 m)   General: Male, obese, well-developed, in pain, no acute  distress Cardiac: regular rate and rhythm, nl S1/S2, no murmurs, rubs or gallops  Pulm: CTAB, no wheezes or crackles, no increased work of breathing  Abd: soft, NTND, bowel sounds present GU: Exam limited by severe tenderness to palpation. There is a discrete area of induration on exam on the right scrotum, no discrete abscesses  palpated. No swelling, erythema, or drainage noted. No abnormalities of testes noted.    Assessment & Plan:   See Encounters Tab for problem based charting.  Patient seen with Dr. Dareen Piano

## 2017-03-05 NOTE — Patient Instructions (Signed)
Continue to take your antibiotics as prescribed. Also continue to use warm compresses.  Take 1 tablet of hydrocodone every 6 hours as needed for pain.  [facility] will be calling you to set up an appointment.  Please call [facility] clinic if symptoms worsen.

## 2017-03-05 NOTE — Assessment & Plan Note (Addendum)
Patient seen in the ED on 8/11 for right scrotal abscess. Presented for follow-up today. No abscesses noted on scrotal ultrasound but patient presented with active pus drainage from right scrotal lesion. No incision and drainage necessary given spontaneous drainage. He was sent home on doxycycline 100 mg BID x14 days and Levaquin 500 mg every other day x2 weeks. He was given 20 tablets of Norco 5-3 25 mg for pain control and indicated to take it every 6 hours as needed. Instructed to follow-up with [facility] within 2 weeks, but has been able to set up an appointment. Reports compliance with antibiotic therapy. Denies systemic symptoms. Complaining of worsening pain today but reports improvement in swelling. Has not noticed further drainage from the affected area.  There is a discrete area of induration on exam on the right scrotum, but no discrete abscesses noted. No swelling or erythema or drainage noted. Patient severely tender to palpation throughout exam. No abnormalities of the right or left testicles noted. He is currently on appropriate antibiotic therapy but clinically not improving. He has completed 5 days of antibiotic therapy and would expect improvement in symptoms by now. Concern for pyocele at this time. Will make urgent referral to urology.  - Continue doxycycline and Levaquin as prescribed - Urgent referral to [facility] placed - Continue to use warm compresses - Refilled Norco 5-3 25 mg for pain control. Provided 20 tablets.

## 2017-03-06 DIAGNOSIS — N186 End stage renal disease: Secondary | ICD-10-CM | POA: Diagnosis not present

## 2017-03-06 DIAGNOSIS — E8779 Other fluid overload: Secondary | ICD-10-CM | POA: Diagnosis not present

## 2017-03-06 DIAGNOSIS — D631 Anemia in chronic kidney disease: Secondary | ICD-10-CM | POA: Diagnosis not present

## 2017-03-06 DIAGNOSIS — N492 Inflammatory disorders of scrotum: Secondary | ICD-10-CM | POA: Diagnosis not present

## 2017-03-06 DIAGNOSIS — N2581 Secondary hyperparathyroidism of renal origin: Secondary | ICD-10-CM | POA: Diagnosis not present

## 2017-03-06 NOTE — Progress Notes (Signed)
Internal Medicine Clinic Attending  I saw and evaluated the patient.  I personally confirmed the key portions of the history and exam documented by Dr. Santos-Sanchez and I reviewed pertinent patient test results.  The assessment, diagnosis, and plan were formulated together and I agree with the documentation in the resident's note. 

## 2017-03-09 ENCOUNTER — Inpatient Hospital Stay (HOSPITAL_COMMUNITY): Payer: Medicare Other | Admitting: Anesthesiology

## 2017-03-09 ENCOUNTER — Other Ambulatory Visit: Payer: Self-pay | Admitting: Internal Medicine

## 2017-03-09 ENCOUNTER — Other Ambulatory Visit: Payer: Self-pay

## 2017-03-09 ENCOUNTER — Inpatient Hospital Stay (HOSPITAL_COMMUNITY)
Admission: EM | Admit: 2017-03-09 | Discharge: 2017-03-12 | DRG: 727 | Disposition: A | Discharge status: Home / Self Care | Payer: Medicare Other | Attending: Internal Medicine | Admitting: Internal Medicine

## 2017-03-09 ENCOUNTER — Emergency Department (HOSPITAL_COMMUNITY): Payer: Medicare Other

## 2017-03-09 ENCOUNTER — Encounter (HOSPITAL_COMMUNITY)
Admission: EM | Disposition: A | Discharge status: Home / Self Care | Payer: Self-pay | Source: Home / Self Care | Attending: Internal Medicine

## 2017-03-09 ENCOUNTER — Encounter (HOSPITAL_COMMUNITY): Payer: Self-pay

## 2017-03-09 DIAGNOSIS — R0602 Shortness of breath: Secondary | ICD-10-CM

## 2017-03-09 DIAGNOSIS — R42 Dizziness and giddiness: Secondary | ICD-10-CM | POA: Diagnosis not present

## 2017-03-09 DIAGNOSIS — Z6837 Body mass index (BMI) 37.0-37.9, adult: Secondary | ICD-10-CM | POA: Diagnosis not present

## 2017-03-09 DIAGNOSIS — Z87891 Personal history of nicotine dependence: Secondary | ICD-10-CM

## 2017-03-09 DIAGNOSIS — Z992 Dependence on renal dialysis: Secondary | ICD-10-CM | POA: Diagnosis not present

## 2017-03-09 DIAGNOSIS — I953 Hypotension of hemodialysis: Secondary | ICD-10-CM | POA: Diagnosis not present

## 2017-03-09 DIAGNOSIS — Z21 Asymptomatic human immunodeficiency virus [HIV] infection status: Secondary | ICD-10-CM | POA: Diagnosis present

## 2017-03-09 DIAGNOSIS — I5032 Chronic diastolic (congestive) heart failure: Secondary | ICD-10-CM | POA: Diagnosis not present

## 2017-03-09 DIAGNOSIS — E875 Hyperkalemia: Secondary | ICD-10-CM | POA: Diagnosis present

## 2017-03-09 DIAGNOSIS — G8929 Other chronic pain: Secondary | ICD-10-CM | POA: Diagnosis present

## 2017-03-09 DIAGNOSIS — Z9114 Patient's other noncompliance with medication regimen: Secondary | ICD-10-CM

## 2017-03-09 DIAGNOSIS — N186 End stage renal disease: Secondary | ICD-10-CM | POA: Diagnosis not present

## 2017-03-09 DIAGNOSIS — L7622 Postprocedural hemorrhage and hematoma of skin and subcutaneous tissue following other procedure: Secondary | ICD-10-CM | POA: Diagnosis not present

## 2017-03-09 DIAGNOSIS — D631 Anemia in chronic kidney disease: Secondary | ICD-10-CM | POA: Diagnosis not present

## 2017-03-09 DIAGNOSIS — N492 Inflammatory disorders of scrotum: Secondary | ICD-10-CM | POA: Diagnosis not present

## 2017-03-09 DIAGNOSIS — Z01818 Encounter for other preprocedural examination: Secondary | ICD-10-CM | POA: Diagnosis not present

## 2017-03-09 DIAGNOSIS — E785 Hyperlipidemia, unspecified: Secondary | ICD-10-CM | POA: Diagnosis not present

## 2017-03-09 DIAGNOSIS — M1990 Unspecified osteoarthritis, unspecified site: Secondary | ICD-10-CM | POA: Diagnosis present

## 2017-03-09 DIAGNOSIS — L02214 Cutaneous abscess of groin: Secondary | ICD-10-CM

## 2017-03-09 DIAGNOSIS — Y92239 Unspecified place in hospital as the place of occurrence of the external cause: Secondary | ICD-10-CM | POA: Diagnosis not present

## 2017-03-09 DIAGNOSIS — I132 Hypertensive heart and chronic kidney disease with heart failure and with stage 5 chronic kidney disease, or end stage renal disease: Secondary | ICD-10-CM | POA: Diagnosis not present

## 2017-03-09 DIAGNOSIS — I517 Cardiomegaly: Secondary | ICD-10-CM | POA: Diagnosis not present

## 2017-03-09 DIAGNOSIS — E8770 Fluid overload, unspecified: Secondary | ICD-10-CM | POA: Diagnosis present

## 2017-03-09 DIAGNOSIS — J984 Other disorders of lung: Secondary | ICD-10-CM | POA: Diagnosis not present

## 2017-03-09 DIAGNOSIS — Z79899 Other long term (current) drug therapy: Secondary | ICD-10-CM

## 2017-03-09 DIAGNOSIS — G40909 Epilepsy, unspecified, not intractable, without status epilepticus: Secondary | ICD-10-CM | POA: Diagnosis not present

## 2017-03-09 DIAGNOSIS — Y838 Other surgical procedures as the cause of abnormal reaction of the patient, or of later complication, without mention of misadventure at the time of the procedure: Secondary | ICD-10-CM | POA: Diagnosis not present

## 2017-03-09 DIAGNOSIS — Z91148 Patient's other noncompliance with medication regimen for other reason: Secondary | ICD-10-CM

## 2017-03-09 DIAGNOSIS — E8779 Other fluid overload: Secondary | ICD-10-CM | POA: Diagnosis not present

## 2017-03-09 DIAGNOSIS — R102 Pelvic and perineal pain: Secondary | ICD-10-CM | POA: Diagnosis not present

## 2017-03-09 DIAGNOSIS — N2581 Secondary hyperparathyroidism of renal origin: Secondary | ICD-10-CM | POA: Diagnosis present

## 2017-03-09 DIAGNOSIS — I10 Essential (primary) hypertension: Secondary | ICD-10-CM

## 2017-03-09 DIAGNOSIS — B20 Human immunodeficiency virus [HIV] disease: Secondary | ICD-10-CM | POA: Diagnosis not present

## 2017-03-09 DIAGNOSIS — N509 Disorder of male genital organs, unspecified: Secondary | ICD-10-CM | POA: Diagnosis not present

## 2017-03-09 DIAGNOSIS — I120 Hypertensive chronic kidney disease with stage 5 chronic kidney disease or end stage renal disease: Secondary | ICD-10-CM | POA: Diagnosis not present

## 2017-03-09 DIAGNOSIS — E8889 Other specified metabolic disorders: Secondary | ICD-10-CM | POA: Diagnosis present

## 2017-03-09 HISTORY — PX: INCISION AND DRAINAGE ABSCESS: SHX5864

## 2017-03-09 LAB — I-STAT CG4 LACTIC ACID, ED
Lactic Acid, Venous: 0.98 mmol/L (ref 0.5–1.9)
Lactic Acid, Venous: 0.62 mmol/L (ref 0.5–1.9)

## 2017-03-09 LAB — CBC WITH DIFFERENTIAL/PLATELET
Basophils Absolute: 0 10*3/uL (ref 0.0–0.1)
Basophils Relative: 0 %
Eosinophils Absolute: 0.2 10*3/uL (ref 0.0–0.7)
Eosinophils Relative: 1 %
HCT: 25 % — ABNORMAL LOW (ref 39.0–52.0)
Hemoglobin: 8.3 g/dL — ABNORMAL LOW (ref 13.0–17.0)
Lymphocytes Relative: 10 %
Lymphs Abs: 1.3 10*3/uL (ref 0.7–4.0)
MCH: 28.5 pg (ref 26.0–34.0)
MCHC: 33.2 g/dL (ref 30.0–36.0)
MCV: 85.9 fL (ref 78.0–100.0)
Monocytes Absolute: 0.9 10*3/uL (ref 0.1–1.0)
Monocytes Relative: 7 %
Neutro Abs: 11.1 10*3/uL — ABNORMAL HIGH (ref 1.7–7.7)
Neutrophils Relative %: 82 %
Platelets: 230 10*3/uL (ref 150–400)
RBC: 2.91 MIL/uL — ABNORMAL LOW (ref 4.22–5.81)
RDW: 17.5 % — ABNORMAL HIGH (ref 11.5–15.5)
WBC: 13.5 10*3/uL — ABNORMAL HIGH (ref 4.0–10.5)

## 2017-03-09 LAB — COMPREHENSIVE METABOLIC PANEL
ALT: 17 U/L (ref 17–63)
AST: 22 U/L (ref 15–41)
Albumin: 2.8 g/dL — ABNORMAL LOW (ref 3.5–5.0)
Alkaline Phosphatase: 140 U/L — ABNORMAL HIGH (ref 38–126)
Anion gap: 14 (ref 5–15)
BUN: 50 mg/dL — ABNORMAL HIGH (ref 6–20)
CO2: 26 mmol/L (ref 22–32)
Calcium: 9.7 mg/dL (ref 8.9–10.3)
Chloride: 92 mmol/L — ABNORMAL LOW (ref 101–111)
Creatinine, Ser: 10.96 mg/dL — ABNORMAL HIGH (ref 0.61–1.24)
GFR calc Af Amer: 5 mL/min — ABNORMAL LOW (ref >60)
GFR calc non Af Amer: 5 mL/min — ABNORMAL LOW (ref >60)
Glucose, Bld: 89 mg/dL (ref 65–99)
Potassium: 4.1 mmol/L (ref 3.5–5.1)
Sodium: 132 mmol/L — ABNORMAL LOW (ref 135–145)
Total Bilirubin: 0.8 mg/dL (ref 0.3–1.2)
Total Protein: 7.6 g/dL (ref 6.5–8.1)

## 2017-03-09 SURGERY — INCISION AND DRAINAGE, ABSCESS
Anesthesia: General | Laterality: Right

## 2017-03-09 MED ORDER — GLYCOPYRROLATE 0.2 MG/ML IV SOSY
PREFILLED_SYRINGE | INTRAVENOUS | Status: DC | PRN
  Administered 2017-03-09: 0.4 mg via INTRAVENOUS

## 2017-03-09 MED ORDER — HYDROMORPHONE HCL 1 MG/ML IJ SOLN
1.0000 mg | Freq: Once | INTRAMUSCULAR | Status: AC
  Administered 2017-03-09: 1 mg via INTRAVENOUS
  Filled 2017-03-09: qty 1

## 2017-03-09 MED ORDER — AMLODIPINE BESYLATE 10 MG PO TABS: 10.0000 mg | ORAL_TABLET | Freq: Every day | ORAL | Status: DC

## 2017-03-09 MED ORDER — ROCURONIUM BROMIDE 100 MG/10ML IV SOLN
INTRAVENOUS | Status: DC | PRN
  Administered 2017-03-09: 30 mg via INTRAVENOUS

## 2017-03-09 MED ORDER — HYDROMORPHONE HCL 1 MG/ML IJ SOLN
0.5000 mg | INTRAMUSCULAR | Status: DC | PRN
  Administered 2017-03-09: 0.5 mg via INTRAVENOUS
  Administered 2017-03-10 – 2017-03-12 (×5): 1 mg via INTRAVENOUS
  Filled 2017-03-09 (×6): qty 1

## 2017-03-09 MED ORDER — HYDROMORPHONE HCL 1 MG/ML IJ SOLN
0.2500 mg | INTRAMUSCULAR | Status: DC | PRN
  Administered 2017-03-09: 0.5 mg via INTRAVENOUS

## 2017-03-09 MED ORDER — MIDAZOLAM HCL 5 MG/5ML IJ SOLN
INTRAMUSCULAR | Status: DC | PRN
  Administered 2017-03-09 (×2): 1 mg via INTRAVENOUS

## 2017-03-09 MED ORDER — BISACODYL 10 MG RE SUPP: 10.0000 mg | Freq: Every day | RECTAL | Status: DC | PRN

## 2017-03-09 MED ORDER — LORAZEPAM 2 MG/ML IJ SOLN
0.5000 mg | Freq: Once | INTRAMUSCULAR | Status: AC
  Administered 2017-03-09: 0.5 mg via INTRAVENOUS
  Filled 2017-03-09: qty 1

## 2017-03-09 MED ORDER — SUCCINYLCHOLINE CHLORIDE 20 MG/ML IJ SOLN
INTRAMUSCULAR | Status: DC | PRN
  Administered 2017-03-09: 120 mg via INTRAVENOUS

## 2017-03-09 MED ORDER — MEPERIDINE HCL 25 MG/ML IJ SOLN: 6.2500 mg | INTRAMUSCULAR | Status: DC | PRN

## 2017-03-09 MED ORDER — ONDANSETRON HCL 4 MG/2ML IJ SOLN
4.0000 mg | Freq: Four times a day (QID) | INTRAMUSCULAR | Status: DC | PRN
  Administered 2017-03-09: 4 mg via INTRAVENOUS

## 2017-03-09 MED ORDER — TENOFOVIR DISOPROXIL FUMARATE 300 MG PO TABS: 300.0000 mg | ORAL_TABLET | ORAL | Status: DC

## 2017-03-09 MED ORDER — MIDAZOLAM HCL 2 MG/2ML IJ SOLN
INTRAMUSCULAR | Status: AC
  Filled 2017-03-09: qty 4

## 2017-03-09 MED ORDER — PIPERACILLIN-TAZOBACTAM IN DEX 2-0.25 GM/50ML IV SOLN
2.2500 g | Freq: Once | INTRAVENOUS | Status: AC
  Administered 2017-03-09: 2.25 g via INTRAVENOUS
  Filled 2017-03-09: qty 50

## 2017-03-09 MED ORDER — IOPAMIDOL (ISOVUE-300) INJECTION 61%
100.0000 mL | Freq: Once | INTRAVENOUS | Status: AC | PRN
  Administered 2017-03-09: 100 mL via INTRAVENOUS

## 2017-03-09 MED ORDER — DOLUTEGRAVIR SODIUM 50 MG PO TABS
50.0000 mg | ORAL_TABLET | Freq: Every day | ORAL | Status: DC
  Administered 2017-03-10 – 2017-03-12 (×3): 50 mg via ORAL
  Filled 2017-03-09 (×3): qty 1

## 2017-03-09 MED ORDER — ONDANSETRON HCL 4 MG/2ML IJ SOLN: 4.0000 mg | Freq: Once | INTRAMUSCULAR | Status: DC | PRN

## 2017-03-09 MED ORDER — SENNOSIDES-DOCUSATE SODIUM 8.6-50 MG PO TABS: 1.0000 | ORAL_TABLET | Freq: Every evening | ORAL | Status: DC | PRN

## 2017-03-09 MED ORDER — LIDOCAINE HCL (CARDIAC) 20 MG/ML IV SOLN
INTRAVENOUS | Status: DC | PRN
  Administered 2017-03-09: 100 mg via INTRAVENOUS

## 2017-03-09 MED ORDER — 0.9 % SODIUM CHLORIDE (POUR BTL) OPTIME
TOPICAL | Status: DC | PRN
  Administered 2017-03-09: 1000 mL

## 2017-03-09 MED ORDER — BUPROPION HCL 75 MG PO TABS
37.5000 mg | ORAL_TABLET | Freq: Two times a day (BID) | ORAL | Status: DC
  Administered 2017-03-10 – 2017-03-12 (×5): 37.5 mg via ORAL
  Filled 2017-03-09 (×7): qty 0.5

## 2017-03-09 MED ORDER — ONDANSETRON HCL 4 MG/2ML IJ SOLN
INTRAMUSCULAR | Status: AC
  Filled 2017-03-09: qty 2

## 2017-03-09 MED ORDER — FEBUXOSTAT 40 MG PO TABS
80.0000 mg | ORAL_TABLET | Freq: Every day | ORAL | Status: DC | PRN
  Filled 2017-03-09: qty 2

## 2017-03-09 MED ORDER — ACETAMINOPHEN 500 MG PO TABS
1000.0000 mg | ORAL_TABLET | Freq: Four times a day (QID) | ORAL | Status: DC | PRN
  Administered 2017-03-10: 1000 mg via ORAL
  Filled 2017-03-09: qty 2

## 2017-03-09 MED ORDER — LIDOCAINE 2% (20 MG/ML) 5 ML SYRINGE
INTRAMUSCULAR | Status: AC
  Filled 2017-03-09: qty 5

## 2017-03-09 MED ORDER — FENTANYL CITRATE (PF) 250 MCG/5ML IJ SOLN
INTRAMUSCULAR | Status: AC
  Filled 2017-03-09: qty 5

## 2017-03-09 MED ORDER — ALBUTEROL SULFATE (2.5 MG/3ML) 0.083% IN NEBU: 2.5000 mg | INHALATION_SOLUTION | Freq: Four times a day (QID) | RESPIRATORY_TRACT | Status: DC | PRN

## 2017-03-09 MED ORDER — PROPOFOL 10 MG/ML IV BOLUS
INTRAVENOUS | Status: AC
  Filled 2017-03-09: qty 20

## 2017-03-09 MED ORDER — DEXAMETHASONE SODIUM PHOSPHATE 4 MG/ML IJ SOLN
INTRAMUSCULAR | Status: DC | PRN
  Administered 2017-03-09: 10 mg via INTRAVENOUS

## 2017-03-09 MED ORDER — SUGAMMADEX SODIUM 200 MG/2ML IV SOLN
INTRAVENOUS | Status: AC
  Filled 2017-03-09: qty 4

## 2017-03-09 MED ORDER — MONTELUKAST SODIUM 10 MG PO TABS
10.0000 mg | ORAL_TABLET | Freq: Every day | ORAL | Status: DC
  Administered 2017-03-10 – 2017-03-11 (×2): 10 mg via ORAL
  Filled 2017-03-09 (×2): qty 1

## 2017-03-09 MED ORDER — DOXAZOSIN MESYLATE 8 MG PO TABS
8.0000 mg | ORAL_TABLET | Freq: Every day | ORAL | Status: DC
  Filled 2017-03-09: qty 1

## 2017-03-09 MED ORDER — CALCITRIOL 0.5 MCG PO CAPS
0.5000 ug | ORAL_CAPSULE | Freq: Every day | ORAL | Status: DC
  Administered 2017-03-10 – 2017-03-12 (×3): 0.5 ug via ORAL
  Filled 2017-03-09 (×3): qty 1

## 2017-03-09 MED ORDER — PRAVASTATIN SODIUM 40 MG PO TABS
40.0000 mg | ORAL_TABLET | Freq: Every evening | ORAL | Status: DC
  Administered 2017-03-10 – 2017-03-12 (×3): 40 mg via ORAL
  Filled 2017-03-09 (×3): qty 1

## 2017-03-09 MED ORDER — HYDROMORPHONE HCL 1 MG/ML IJ SOLN
INTRAMUSCULAR | Status: AC
  Administered 2017-03-09: 0.5 mg via INTRAVENOUS
  Filled 2017-03-09: qty 1

## 2017-03-09 MED ORDER — HYDROMORPHONE HCL 1 MG/ML IJ SOLN
1.0000 mg | Freq: Once | INTRAMUSCULAR | Status: AC
Start: 2017-03-09 — End: 2017-03-09
  Administered 2017-03-09: 1 mg via INTRAVENOUS
  Filled 2017-03-09: qty 1

## 2017-03-09 MED ORDER — CARVEDILOL 25 MG PO TABS
25.0000 mg | ORAL_TABLET | Freq: Once | ORAL | Status: AC
Start: 2017-03-09 — End: 2017-03-09
  Administered 2017-03-09: 25 mg via ORAL

## 2017-03-09 MED ORDER — SUCCINYLCHOLINE CHLORIDE 200 MG/10ML IV SOSY
PREFILLED_SYRINGE | INTRAVENOUS | Status: AC
  Filled 2017-03-09: qty 10

## 2017-03-09 MED ORDER — FERRIC CITRATE 1 GM 210 MG(FE) PO TABS
210.0000 mg | ORAL_TABLET | Freq: Three times a day (TID) | ORAL | Status: DC
  Administered 2017-03-10 – 2017-03-12 (×8): 210 mg via ORAL
  Filled 2017-03-09 (×10): qty 1

## 2017-03-09 MED ORDER — CARVEDILOL 12.5 MG PO TABS
ORAL_TABLET | ORAL | Status: AC
  Administered 2017-03-09: 25 mg via ORAL
  Filled 2017-03-09: qty 2

## 2017-03-09 MED ORDER — ROCURONIUM BROMIDE 10 MG/ML (PF) SYRINGE
PREFILLED_SYRINGE | INTRAVENOUS | Status: AC
  Filled 2017-03-09: qty 5

## 2017-03-09 MED ORDER — CARVEDILOL 25 MG PO TABS: 25.0000 mg | ORAL_TABLET | Freq: Two times a day (BID) | ORAL | Status: DC

## 2017-03-09 MED ORDER — FENTANYL CITRATE (PF) 100 MCG/2ML IJ SOLN
INTRAMUSCULAR | Status: DC | PRN
  Administered 2017-03-09: 100 ug via INTRAVENOUS
  Administered 2017-03-09: 50 ug via INTRAVENOUS

## 2017-03-09 MED ORDER — ONDANSETRON HCL 4 MG PO TABS: 4.0000 mg | ORAL_TABLET | Freq: Four times a day (QID) | ORAL | Status: DC | PRN

## 2017-03-09 MED ORDER — PROPOFOL 10 MG/ML IV BOLUS
INTRAVENOUS | Status: DC | PRN
  Administered 2017-03-09: 130 mg via INTRAVENOUS

## 2017-03-09 MED ORDER — HYDRALAZINE HCL 25 MG PO TABS
25.0000 mg | ORAL_TABLET | Freq: Three times a day (TID) | ORAL | Status: DC
  Administered 2017-03-10: 25 mg via ORAL
  Filled 2017-03-09 (×2): qty 1

## 2017-03-09 MED ORDER — RILPIVIRINE HCL 25 MG PO TABS
25.0000 mg | ORAL_TABLET | Freq: Every day | ORAL | Status: DC
  Administered 2017-03-10 – 2017-03-12 (×3): 25 mg via ORAL
  Filled 2017-03-09 (×3): qty 1

## 2017-03-09 MED ORDER — VANCOMYCIN HCL 10 G IV SOLR
2000.0000 mg | Freq: Once | INTRAVENOUS | Status: AC
  Administered 2017-03-09: 2000 mg via INTRAVENOUS
  Filled 2017-03-09: qty 2000

## 2017-03-09 MED ORDER — HYDROCODONE-ACETAMINOPHEN 5-325 MG PO TABS
1.0000 | ORAL_TABLET | Freq: Four times a day (QID) | ORAL | Status: DC | PRN
  Administered 2017-03-10 – 2017-03-12 (×7): 1 via ORAL
  Filled 2017-03-09 (×7): qty 1

## 2017-03-09 MED ORDER — NEOSTIGMINE METHYLSULFATE 5 MG/5ML IV SOSY
PREFILLED_SYRINGE | INTRAVENOUS | Status: DC | PRN
  Administered 2017-03-09: 5 mg via INTRAVENOUS

## 2017-03-09 MED ORDER — DEXAMETHASONE SODIUM PHOSPHATE 10 MG/ML IJ SOLN
INTRAMUSCULAR | Status: AC
  Filled 2017-03-09: qty 1

## 2017-03-09 SURGICAL SUPPLY — 24 items
BAG URO CATCHER STRL LF (MISCELLANEOUS) IMPLANT
BLADE 10 SAFETY STRL DISP (BLADE) ×3 IMPLANT
BLADE SURG 15 STRL LF DISP TIS (BLADE) ×2 IMPLANT
BLADE SURG 15 STRL SS (BLADE) ×4
BNDG GAUZE ELAST 4 BULKY (GAUZE/BANDAGES/DRESSINGS) ×3 IMPLANT
CANISTER SUCT 3000ML PPV (MISCELLANEOUS) ×3 IMPLANT
CONT SPEC 4OZ CLIKSEAL STRL BL (MISCELLANEOUS) ×3 IMPLANT
DRAPE LAPAROTOMY T 102X78X121 (DRAPES) ×3 IMPLANT
DRSG PAD ABDOMINAL 8X10 ST (GAUZE/BANDAGES/DRESSINGS) ×6 IMPLANT
ELECT REM PT RETURN 9FT ADLT (ELECTROSURGICAL) ×3
ELECTRODE REM PT RTRN 9FT ADLT (ELECTROSURGICAL) ×1 IMPLANT
GAUZE PACKING IODOFORM 1/2 (PACKING) ×3 IMPLANT
GAUZE SPONGE 4X4 12PLY STRL (GAUZE/BANDAGES/DRESSINGS) ×3 IMPLANT
GLOVE SURG ORTHO 8.0 STRL STRW (GLOVE) ×3 IMPLANT
KIT BASIN OR (CUSTOM PROCEDURE TRAY) ×3 IMPLANT
KIT ROOM TURNOVER OR (KITS) ×3 IMPLANT
NS IRRIG 1000ML POUR BTL (IV SOLUTION) ×3 IMPLANT
PACK GENERAL/GYN (CUSTOM PROCEDURE TRAY) ×3 IMPLANT
PAD ABD 8X10 STRL (GAUZE/BANDAGES/DRESSINGS) ×3 IMPLANT
PAD ARMBOARD 7.5X6 YLW CONV (MISCELLANEOUS) ×6 IMPLANT
SOL PREP POV-IOD 4OZ 10% (MISCELLANEOUS) ×3 IMPLANT
SWAB COLLECTION DEVICE MRSA (MISCELLANEOUS) ×3 IMPLANT
TOWEL OR 17X24 6PK STRL BLUE (TOWEL DISPOSABLE) ×3 IMPLANT
TOWEL OR 17X26 10 PK STRL BLUE (TOWEL DISPOSABLE) ×3 IMPLANT

## 2017-03-09 NOTE — Op Note (Signed)
Preoperative diagnosis: Right scrotal abscess.  Postoperative diagnosis: Same  Principal procedure: Incision and drainage of large right scrotal abscess.  Surgeon: Diona Fanti  Anesthesia: Gen. endotracheal.  Complications: None.  Drains: None.  Specimen: Cultures from purulent drainage for anaerobic and aerobic.  Indications: 51 year old male presenting with a large right scrotal abscess deep in his scrotum and along his proximal penis.  The patient was seen by me earlier this evening.  He was in significant pain, but was not septic.  He does have multiple medical issues.  He presents at this time for anesthetic, incision and drainage.  I discussed the procedure with the patient and his family member who understand and desire to proceed.  Findings: Deep abscess, approximately 1-2 centimeters into his right hemiscrotum, extending distally and proximally along the penile shaft.  Copious purulent drainage was produced.  Description of procedure: The patient was properly identified in the holding area.  He received preoperative Zosyn and vancomycin.  He was taken to the operating room where general anesthetic was administered.  He was placed in the frog-leg position.  Genitalia perineum and lower abdomen prepped and draped.  Proper timeout was performed.  15 blade was used to create a 2 centimeter incision on the anterior lateral superior scrotum.  This was, from what I thought, posterior to the spermatic cord.  Incision was opened up, and dissection carried down to the abscess.  This abscess was then incised for a length of 2 centimeters.  Purulent material was produced in copious amounts.  It was cultured as above.  Approximately 2-300 mL was obtained.  Following appropriate drainage, I irrigated with 1 liter of saline.  Skin edges were cauterized to stop the bleeding, as was the deep tissue.  At this point, a 1 inch iodoform packing was placed within the abscess cavity, both distally along the  penile shaft as well as proximally and then inferiorly within the scrotum.  At this point, there was adequate hemostasis.  The skin edges were left open.  I then placed a Kerlix dressing, and AVD pad and then mesh underpants.  Patient was then awakened and taken to the PACU in stable condition.  He tolerated the procedure well.

## 2017-03-09 NOTE — ED Provider Notes (Signed)
[facility] DEPT Provider Note   CSN: [id number] Arrival date & time: 03/09/17  1028     History   Chief Complaint Chief Complaint  Patient presents with  . Abscess    HPI Darrell Johnson is a 51 y.o. male.  HPI   31yM with R groin pain/swelling. Seen on ED on 8/11. Diagnosed with scrotal abscess and placed on doxy/levaquin. Has been taking without improvement. Seen by PCP in office on 8/16 and appointment made with urology for later this week. Pt says he cannot wait that long. ESRD and couldn't finish session of dialysis today because of pain. He just can't find a comfortable position. Estimates he had 1.5 hours of HD. No fever or chills. He does make small amount of urine but says he hasn't been able to urinate in past day and has increasing pain when he tries to.   Past Medical History:  Diagnosis Date  . Anemia   . Arthritis   . CHF (congestive heart failure) ([place])   . Chronic kidney disease    stage 3-4 CKD, followed by Dr. Moshe Cipro  . ESRD (end stage renal disease) ([place])    ESRD due to HTN started dialysis Feb 2016  . Gout   . Gout, unspecified 08/13/2009   Qualifier: Diagnosis of  By: Redmond Pulling  MD, Mateo Flow    . H1N1 influenza    March 2016  . History of gout   . History of syphilis   . HIV (human immunodeficiency virus infection) ([place])   . HIV infection ([place]) 1980's   on ART therapy since, followed by ID clinic, complicated  by neuropathy  . HTN (hypertension)   . Hyperlipidemia    hypertrygliceridemia determined ti be secondary to ART therpay  . Hypertension   . HYPERTENSION 05/08/2006  . 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 Pneumonia   . Rib fractures 01/2009  . Seizure disorder ([place])   . Seizures ([place])    last seizure was >5 years ago, pt has family history of seizures  . 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  Syphilis 1997   history of syphilis 1997  . Tobacco abuse     Patient Active Problem List   Diagnosis Date Noted  . Trigger finger of right hand 09/11/2016  . Abscess of groin, right 09/04/2016  . Hyperlipidemia 03/31/2016  . Anxiety state 03/11/2016  . Lumbar radiculopathy 01/28/2016  . Seasonal allergies   . Healthcare maintenance 05/03/2015  . Anemia in chronic kidney disease 08/30/2014  . End stage renal disease ([place]) 12/27/2013  . Drug noncompliance 11/07/2013  . History of syphilis 11/07/2013  . Arthritis 09/09/2013  . Tobacco abuse 09/07/2013  . Chronic pain disorder 04/28/2013  . HYPERTRIGLYCERIDEMIA 11/01/2009  . Gout 08/13/2009  . ERECTILE DYSFUNCTION 06/08/2008  . Human immunodeficiency virus (HIV) disease ([place]) 05/08/2006  . PERIPHERAL NEUROPATHY 05/08/2006  . Essential hypertension 05/08/2006  . SEIZURE DISORDER 05/08/2006    Past Surgical History:  Procedure Laterality Date  . ABSCESS DRAINAGE    . AV FISTULA PLACEMENT Right 12/02/2013   Procedure: ARTERIOVENOUS (AV) FISTULA CREATION;  Surgeon: Rosetta Posner, MD;  Location: [facility] OR;  Service: Vascular;  Laterality: Right;       Home Medications    Prior to Admission medications   Medication Sig Start Date End Date Taking? Authorizing Provider  acetaminophen (TYLENOL)  500 MG tablet Take 1,000 mg by mouth every 6 (six) hours as needed for mild pain.    [provider]  albuterol (PROVENTIL HFA;VENTOLIN HFA) 108 (90 Base) MCG/ACT inhaler Inhale 2 puffs into the lungs every 6 (six) hours as needed for wheezing or shortness of breath. 10/02/16   Jule Ser, DO  albuterol (PROVENTIL) (2.5 MG/3ML) 0.083% nebulizer solution Take 3 mLs (2.5 mg total) by nebulization every 6 (six) hours as needed for wheezing or shortness of breath. 09/04/16   Jule Ser, DO  amLODipine (NORVASC) 10 MG tablet Take 10 mg by mouth daily.    [provider]  AURYXIA 1 GM 210 MG(Fe) tablet Take 210 mg by mouth  daily. 02/23/17   [provider]  buPROPion [facility]) 75 MG tablet TAKE 1/2 TABLET TWICE A DAY 02/10/17   Jule Ser, DO  calcitRIOL (ROCALTROL) 0.5 MCG capsule Take by mouth.    [provider]  carvedilol (COREG) 25 MG tablet TAKE 1 TABLET (25 MG TOTAL) BY MOUTH 2 (TWO) TIMES DAILY WITH A MEAL. 09/11/16   Jule Ser, DO  doxazosin (CARDURA) 8 MG tablet TAKE 1 TABLET (8 MG TOTAL) BY MOUTH AT BEDTIME. 09/11/16   Jule Ser, DO  doxycycline (VIBRAMYCIN) 100 MG capsule Take 1 capsule (100 mg total) by mouth 2 (two) times daily. One po bid x 14 days 02/28/17   Orlie Dakin, MD  EDURANT 25 MG TABS tablet TAKE 1 TABLET (25 MG TOTAL) BY MOUTH DAILY WITH BREAKFAST. 12/22/16   Carlyle Basques, MD  Febuxostat (ULORIC) 80 MG TABS Take 80 mg by mouth daily as needed (gout).     [provider]  furosemide (LASIX) 40 MG tablet Take 1 tablet (40 mg total) by mouth daily. 09/11/16   Jule Ser, DO  hydrALAZINE (APRESOLINE) 25 MG tablet Take 1 tablet (25 mg total) by mouth 3 (three) times daily. 11/24/16   Jule Ser, DO  HYDROcodone-acetaminophen (NORCO) 5-325 MG tablet Take 1 tablet by mouth every 6 (six) hours as needed for severe pain. 03/05/17   Welford Roche, MD  levofloxacin (LEVAQUIN) 500 MG tablet 1 tablet every other day for 2 weeks starting 03/02/2017 02/28/17   Orlie Dakin, MD  montelukast (SINGULAIR) 10 MG tablet Take 1 tablet (10 mg total) by mouth at bedtime. 12/17/16   Jule Ser, DO  pravastatin (PRAVACHOL) 40 MG tablet TAKE 1 TABLET (40 MG TOTAL) BY MOUTH EVERY EVENING. 01/27/17 01/27/18  Jule Ser, DO  TIVICAY 50 MG tablet TAKE 1 TABLET (50 MG TOTAL) BY MOUTH DAILY. 12/22/16   Carlyle Basques, MD  VIREAD 300 MG tablet TAKE 1 TABLET BY MOUTH ONCE A WEEK 01/06/17   Carlyle Basques, MD    Family History Family History  Problem Relation Age of Onset  . Cancer Mother   . Hypertension Mother   . COPD Father   . Hypertension Father    . Diabetes Sister   . Hypertension Sister   . Diabetes Brother   . Hypertension Brother   . Stroke Neg Hx     Social History Social History  Substance Use Topics  . Smoking status: Former Smoker    Packs/day: 0.40    Years: 20.00    Start date: 01/10/2016    Quit date: 04/07/2016  . Smokeless tobacco: Never Used  . Alcohol use No     Allergies   Patient has no known allergies.   Review of Systems Review of Systems  All systems reviewed and negative, other than  as noted in HPI.  Physical Exam Updated Vital Signs BP (!) 162/89 (BP Location: Left Arm)   Pulse 86   Temp 98.5 F (36.9 C) (Oral)   Resp 16   SpO2 97%   Physical Exam  Constitutional: He appears well-developed and well-nourished. No distress.  HENT:  Head: Normocephalic and atraumatic.  Eyes: Conjunctivae are normal. Right eye exhibits no discharge. Left eye exhibits no discharge.  Neck: Neck supple.  Cardiovascular: Normal rate, regular rhythm and normal heart sounds.  Exam reveals no gallop and no friction rub.   No murmur heard. Fistula RUE. Palpable thrill.   Pulmonary/Chest: Effort normal and breath sounds normal. No respiratory distress.  Abdominal: Soft. He exhibits no distension. There is no tenderness.  Genitourinary:  Genitourinary Comments: Pain and swelling extending from base of scrotum/penis and extending towards R inguinal region. Testicles nontender and w/o palpable mass.   Musculoskeletal: He exhibits no edema or tenderness.  Neurological: He is alert.  Skin: Skin is warm and dry.  Psychiatric: He has a normal mood and affect. His behavior is normal. Thought content normal.  Nursing note and vitals reviewed.    ED Treatments / Results  Labs (all labs ordered are listed, but only abnormal results are displayed) Labs Reviewed  COMPREHENSIVE METABOLIC PANEL - Abnormal; Notable for the following:       Result Value   Sodium 132 (*)    Chloride 92 (*)    BUN 50 (*)    Creatinine,  Ser 10.96 (*)    Albumin 2.8 (*)    Alkaline Phosphatase 140 (*)    GFR calc non Af Amer 5 (*)    GFR calc Af Amer 5 (*)    All other components within normal limits  CBC WITH DIFFERENTIAL/PLATELET - Abnormal; Notable for the following:    WBC 13.5 (*)    RBC 2.91 (*)    Hemoglobin 8.3 (*)    HCT 25.0 (*)    RDW 17.5 (*)    Neutro Abs 11.1 (*)    All other components within normal limits  I-STAT CG4 LACTIC ACID, ED  I-STAT CG4 LACTIC ACID, ED    EKG  EKG Interpretation None       Radiology Ct Pelvis W Contrast  Result Date: 03/09/2017 CLINICAL DATA:  Scrotal mass . Dialysis patient with pain for the past 2 weeks. EXAM: CT PELVIS WITH CONTRAST TECHNIQUE: Multidetector CT imaging of the pelvis was performed using the standard protocol following the bolus administration of intravenous contrast. CONTRAST:  162mL ISOVUE-300 IOPAMIDOL (ISOVUE-300) INJECTION 61% COMPARISON:  Ultrasound 02/28/2017.  CT of 09/29/2014. FINDINGS: Urinary Tract:  No distal hydroureter.  Normal urinary bladder. Bowel: Normal colon, appendix, and terminal ileum. Normal pelvic small bowel. Vascular/Lymphatic: Aortic and branch vessel atherosclerosis. Right external iliac node is enlarged at 1.4 cm on image 35/series 3. A right inguinal node measures 1.3 cm on image 45/series 3. Reproductive: Normal prostate. Small bilateral hydroceles. Edema throughout the scrotum. There is edema anterior to the symphysis pubis and cephalad to the penis on image 45/series 3. There is an irregularly-shaped fluid collection which is positioned at the base of the penis, eccentric right. This does cross the midline, measuring 5.9 x 11.4 cm on image 56/series 3. Displaces the corpora to the left. Other:  No significant free fluid. 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 IMPRESSION: 1. Fluid collection centered at the right penile base. Given the clinical history and adjacent scrotal and subcutaneous  edema, favored to represent an abscess. 2. Pelvic adenopathy, likely reactive. Consider CT followup at 3 months to confirm resolution. 3.  Aortic Atherosclerosis (ICD10-I70.0). 4. Small bilateral hydroceles with diffuse scrotal edema. 5. Left femoral head avascular necrosis. Electronically Signed   By: Abigail Miyamoto M.D.   On: 03/09/2017 13:46    Procedures Procedures (including critical care time)  Medications Ordered in ED Medications  vancomycin (VANCOCIN) 2,000 mg in sodium chloride 0.9 % 500 mL IVPB (2,000 mg Intravenous New Bag/Given 03/09/17 1715)  HYDROmorphone (DILAUDID) injection 1 mg (1 mg Intravenous Given 03/09/17 1252)  iopamidol (ISOVUE-300) 61 % injection 100 mL (100 mLs Intravenous Contrast Given 03/09/17 1306)  HYDROmorphone (DILAUDID) injection 1 mg (1 mg Intravenous Given 03/09/17 1706)  LORazepam (ATIVAN) injection 0.5 mg (0.5 mg Intravenous Given 03/09/17 1708)  piperacillin-tazobactam (ZOSYN) IVPB 2.25 g (2.25 g Intravenous New Bag/Given 03/09/17 1718)     Initial Impression / Assessment and Plan / ED Course  I have reviewed the triage vital signs and the nursing notes.  Pertinent labs & imaging results that were available during my care of the patient were reviewed by me and considered in my medical decision making (see chart for details).     50yM with testicular pain. Testicles themselves seem normal on exam but there is some scrotal edema. I don't visualize any superficial abscess on penis/scrotum/perineum as noted on recent exams. Painful mass that seems to extend from near base of penis up into R inguinal region. Previous US noted. Has been on abx without improvement. He has been on dialysis for about 2 years. Will CT pelvis with contrast.   1:58 PM Imaging as above. Has been on abx since 8/10 with progression of symptoms. Large abscess beyond what can be drained at bedside. Urology consulted. Appreciate Dr Dahlstedt's help. Abx. PRN pain meds.   Final Clinical  Impressions(s) / ED Diagnoses   Final diagnoses:  Scrotal abscess    New Prescriptions New Prescriptions   No medications on file     Virgel Manifold, MD 03/09/17 1756

## 2017-03-09 NOTE — ED Notes (Signed)
Pt reports he has dialysis MWF and was only able to get half of his treatment today because "it pulls on it when they pull off at dialysis" He also states he is supposed to see a surgeon on Thursday but "can not wait until Thursday because the pain is so bad".

## 2017-03-09 NOTE — Anesthesia Postprocedure Evaluation (Signed)
Anesthesia Post Note  Patient: Darrell Johnson  Procedure(s) Performed: Procedure(s) (LRB): INCISION AND DRAINAGE Right Scrotal Abscess (Right)     Patient location during evaluation: PACU Anesthesia Type: General Level of consciousness: awake and alert Pain management: pain level controlled Vital Signs Assessment: post-procedure vital signs reviewed and stable Respiratory status: spontaneous breathing, nonlabored ventilation, respiratory function stable and patient connected to nasal cannula oxygen Cardiovascular status: blood pressure returned to baseline and stable Postop Assessment: no signs of nausea or vomiting Anesthetic complications: no    Last Vitals:  Vitals:   03/09/17 2245 03/09/17 2300  BP: (!) 172/91   Pulse: 84 80  Resp: 19 (!) 21  Temp:    SpO2: 100% 99%    Last Pain:  Vitals:   03/09/17 2245  TempSrc:   PainSc: 0-No pain                 Keighley Deckman DAVID

## 2017-03-09 NOTE — Consult Note (Signed)
Urology Consult  Consulting MD: Virgel Manifold  CC: Scrotal swelling/pain  HPI: This is a 51 year old male with multiple medical issues including history of congestive heart failure, chronic kidney disease, now on hemodialysis, gout, history of HIV infection, hypertension, and seizure disorder.  Over the past 2 weeks he has had swelling and pain in his scrotum.  He has been on antibiotics for this, but despite this, there is been persistent, increasing pain and swelling.  Presentation to the emergency room today by the patient.  CT scan of the abdomen and pelvis revealed a large right scrotal and genital abscess.  Urologic consultation is requested.  The patient states that years ago he did have a smaller abscess drained.  PMH: Past Medical History:  Diagnosis Date  . Anemia   . Arthritis   . CHF (congestive heart failure) ([place])   . Chronic kidney disease    stage 3-4 CKD, followed by Dr. Moshe Cipro  . ESRD (end stage renal disease) ([place])    ESRD due to HTN started dialysis Feb 2016  . Gout   . Gout, unspecified 08/13/2009   Qualifier: Diagnosis of  By: Redmond Pulling  MD, Mateo Flow    . H1N1 influenza    March 2016  . History of gout   . History of syphilis   . HIV (human immunodeficiency virus infection) ([place])   . HIV infection ([place]) 1980's   on ART therapy since, followed by ID clinic, complicated  by neuropathy  . HTN (hypertension)   . Hyperlipidemia    hypertrygliceridemia determined ti be secondary to ART therpay  . Hypertension   . HYPERTENSION 05/08/2006  . 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 Pneumonia   . Rib fractures 01/2009  . Seizure disorder ([place])   . Seizures ([place])    last seizure was >5 years ago, pt has family history of seizures  . 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 Syphilis 1997   history of syphilis 1997  . Tobacco  abuse     PSH: Past Surgical History:  Procedure Laterality Date  . ABSCESS DRAINAGE    . AV FISTULA PLACEMENT Right 12/02/2013   Procedure: ARTERIOVENOUS (AV) FISTULA CREATION;  Surgeon: Rosetta Posner, MD;  Location: [facility];  Service: Vascular;  Laterality: Right;    Allergies: No Known Allergies  Medications:  (Not in a hospital admission)   Social History: Social History   Social History  . Marital status: Married    Spouse name: N/A  . Number of children: N/A  . Years of education: N/A   Occupational History  . Not on file.   Social History Main Topics  . Smoking status: Former Smoker    Packs/day: 0.40    Years: 20.00    Start date: 01/10/2016    Quit date: 04/07/2016  . Smokeless tobacco: Never Used  . Alcohol use No  . Drug use: No  . Sexual activity: Not on file     Comment: given condoms   Other Topics Concern  . Not on file   Social History Narrative   ** Merged History Encounter **        Family History: Family History  Problem Relation Age of Onset  . Cancer Mother   . Hypertension Mother   . COPD Father   . Hypertension Father   . Diabetes Sister   .  Hypertension Sister   . Diabetes Brother   . Hypertension Brother   . Stroke Neg Hx     Review of Systems: Positive: Scrotal swelling, pain.  No fevers, shakes or chills. Negative:   A further 10 point review of systems was negative except what is listed in the HPI.  Physical Exam: @VITALS2 @ General: Mild distress.  Awake. Head:  Normocephalic.  Atraumatic. ENT:  EOMI.  Mucous membranes moist Neck:  Supple.  No lymphadenopathy. CV:   Regular rate. Pulmonary: Equal effort bilaterally.  Clear to auscultation bilaterally. Abdomen: Soft, mildly obese.  Non- tender to palpation. Skin:  Normal turgor.  No visible rash. Extremity: No gross deformity of bilateral upper extremities.  No gross deformity of bilateral lower extremities. Neurologic: Alert. Appropriate mood.  Penis:              Circumcised.  No lesions. Urethra:  Orthotopic meatus. Scrotum: There is a fluctuant/erythematous process.  It starts slightly above and to the right of the penis and extends medially and inferiorly to the base of the penis. Testicles: Descended bilaterally.  No masses bilaterally. Epididymis: Palpable bilaterally.  Non Tender to palpation.  Studies:  Recent Labs     03/09/17  1125  HGB  8.3*  WBC  13.5*  PLT  230    Recent Labs     03/09/17  1125  NA  132*  K  4.1  CL  92*  CO2  26  BUN  50*  CREATININE  10.96*  CALCIUM  9.7  GFRNONAA  5*  GFRAA  5*     No results for input(s): INR, APTT in the last 72 hours.  Invalid input(s): PT   Invalid input(s): ABG    Assessment:  Right scrotal abscess, significant  Plan: This is a fairly deep abscess, but does look fairly confluent and at this point, with some fluctuance, I think should be drained.  It is necessary with the size of this to do it under anesthesia.  I've discussed this with the patient.  I will speak with the hospitalist about admitting him, as he will need dialysis during his hospitalization, and dressing changes for a day or 2.    [phone]

## 2017-03-09 NOTE — ED Notes (Signed)
Attempted report 

## 2017-03-09 NOTE — Anesthesia Preprocedure Evaluation (Addendum)
Anesthesia Evaluation  Patient identified by MRN, date of birth, ID band Patient awake    Airway Mallampati: II  TM Distance: >3 FB Neck ROM: Full    Dental   Pulmonary Current Smoker, former smoker,  3 cigarettes a day   Pulmonary exam normal        Cardiovascular hypertension, Pt. on medications and Pt. on home beta blockers + PND  Normal cardiovascular exam     Neuro/Psych Last seizure "years ago"    GI/Hepatic   Endo/Other    Renal/GU ARFRenal diseaseLast dialysis today "half of one"     Musculoskeletal  (+) Arthritis ,   Abdominal   Peds  Hematology  (+) HIV,   Anesthesia Other Findings   Reproductive/Obstetrics                            Anesthesia Physical Anesthesia Plan  ASA: III and emergent  Anesthesia Plan: General   Post-op Pain Management:    Induction: Intravenous, Rapid sequence and Cricoid pressure planned  PONV Risk Score and Plan: 2 and Ondansetron, Treatment may vary due to age or medical condition and Dexamethasone  Airway Management Planned: Oral ETT  Additional Equipment:   Intra-op Plan:   Post-operative Plan: Extubation in OR  Informed Consent: I have reviewed the patients History and Physical, chart, labs and discussed the procedure including the risks, benefits and alternatives for the proposed anesthesia with the patient or authorized representative who has indicated his/her understanding and acceptance.     Plan Discussed with: CRNA and Surgeon  Anesthesia Plan Comments:         Anesthesia Quick Evaluation

## 2017-03-09 NOTE — ED Notes (Signed)
Patient transported to CT 

## 2017-03-09 NOTE — ED Notes (Signed)
Urologist at bedside speaking with patient about planned procedure.

## 2017-03-09 NOTE — Progress Notes (Addendum)
Pharmacy Antibiotic Note  Darrell Johnson is a 51 y.o. male admitted on 03/09/2017 with an abscess near testicular region.  Pharmacy has been consulted for Vancomycin dosing. H/o of CKD on HD. WBC 13.5.   Plan: Vancomycin 2000 mg IV once, then vancomycin 1 gm IV with HD   Monitor CBC, cultures and clinical progress [facility] at [facility]  F/u HD schedule      Temp (24hrs), Avg:98.5 F (36.9 C), Min:98.5 F (36.9 C), Max:98.5 F (36.9 C)   Recent Labs Lab 03/09/17 1125 03/09/17 1200 03/09/17 1521  WBC 13.5*  --   --   CREATININE 10.96*  --   --   LATICACIDVEN  --  0.98 0.62    Estimated Creatinine Clearance: 10.1 mL/min (A) (by C-G formula based on SCr of 10.96 mg/dL (H)).    No Known Allergies  Antimicrobials this admission: 8/20 Vanc >>  8/20 Zosyn >>   Dose adjustments this admission: None  Microbiology results: None   Thank you for allowing pharmacy to be a part of this patient's care.  Albertina Parr, PharmD., BCPS Clinical Pharmacist Pager [phone]

## 2017-03-09 NOTE — ED Notes (Signed)
ED Provider at bedside. 

## 2017-03-09 NOTE — ED Triage Notes (Signed)
Pt here for tx of abscess in the groin. Pt states he has seen his PCP without intervention and cannot tolerate the pain. Hx of HIV, MWF dialysis pt. Unable to tolerate dialysis due to the pain. Pt aoX4.

## 2017-03-09 NOTE — Transfer of Care (Signed)
Immediate Anesthesia Transfer of Care Note  Patient: Darrell Johnson  Procedure(s) Performed: Procedure(s): INCISION AND DRAINAGE Right Scrotal Abscess (Right)  Patient Location: PACU  Anesthesia Type:General  Level of Consciousness: awake, alert  and oriented  Airway & Oxygen Therapy: Patient Spontanous Breathing and Patient connected to nasal cannula oxygen  Post-op Assessment: Report given to RN and Post -op Vital signs reviewed and stable  Post vital signs: Reviewed  Last Vitals: 135/77, 80,18,100% Vitals:   03/09/17 1945 03/09/17 2030  BP: (!) 177/96 (!) 190/85  Pulse: 89 89  Resp: (!) 21 17  Temp:    SpO2: 94% 94%    Last Pain:  Vitals:   03/09/17 1928  TempSrc:   PainSc: Asleep         Complications: No apparent anesthesia complications

## 2017-03-09 NOTE — H&P (Signed)
Date: 03/09/2017               Patient Name:  DARRYN KYDD MRN: [id number]  DOB: 05/12/1966 Age / Sex: 51 y.o., male   PCP: Jule Ser, DO         Medical Service: Internal Medicine Teaching Service         Attending Physician: Dr. Oval Linsey, MD    First Contact: Dr. Berneice Gandy Pager: [phone]  Second Contact: Dr. Charlynn Grimes Pager: [phone]       After Hours (After 5p/  First Contact Pager: [phone]  weekends / holidays): Second Contact Pager: [phone]   Chief Complaint: Scrotal Abcess  History of Present Illness: Mr. Leja is a 51 yo M with history of CHF (EF 55-60% on 09/28/14); ESRD on HD; Gout; HTN; HIV on HAART; Anemia; and Seizure presenting with scrotal abscess pain and swelling. He has been experiencing 2 weeks of scrotal pain and swelling worsening for the past several days. He was seen in the ED on 8/11 for the pain and was diagnosed with a right scrotal abscess. He was discharged with prescriptions for Doxycycline 100 BID for 14 days, Levaquin 500mg  every other day for 2 weeks, and Norco 5-325mg  1-2 Tabs q6h PRN and was also given a referral to Urology. He was seen at [facility] on 8/16 for follow up. He was experiencing worsening pain with active drainage of pus from the scrotal lesion, but improvement in swelling at that time. He had also been unable to arrange his urology appointment. His Antibiotics were continued, pain medication was refilled and urgent urology referral was made. He presented today following increasing pain to 10/10 and heaviness in his scrotum over the weekend with it being unbearable today even with pain medications. He presented from dialysis this morning where the pain was so bad he was unable to complete his session, completed approximately 1.5 hrs. He denies fevers, chills. He endorses shortness of breath and attributes this to not being down to his dry weight. He also endorses difficulty urinating, dysuria, and  constipation.  In the ED, CMP was consistent with ESRD; CBC showed WBC 13.5, Hgb 8.3; Lactic acid 0.98, 0.62. EKG showed Sinus rhythm with RSR' in V1 and Nonspecific T-wave abnormalities. Chest Xray showed cardiomegaly, mild interstitial edema, and suspected small right effusion. CT Pelvis showed 11.4 x 5.9 cm Fluid collection centered at the right penile base favoring abcess. He receive vancomycin 2g, Zosyn 2.25g, Dilaudid 1mg  IV x2, and ativan 0.5mg . Patient was seen by urology in the ED and scheduled for same day incision and drainage of the abscess. He will be admitted for procedure and further treatment.  Meds:  Current Meds  Medication Sig  . acetaminophen (TYLENOL) 500 MG tablet Take 1,000 mg by mouth every 6 (six) hours as needed for mild pain.  Marland Kitchen albuterol (PROVENTIL HFA;VENTOLIN HFA) 108 (90 Base) MCG/ACT inhaler Inhale 2 puffs into the lungs every 6 (six) hours as needed for wheezing or shortness of breath.  Marland Kitchen albuterol (PROVENTIL) (2.5 MG/3ML) 0.083% nebulizer solution Take 3 mLs (2.5 mg total) by nebulization every 6 (six) hours as needed for wheezing or shortness of breath.  Marland Kitchen amLODipine (NORVASC) 5 MG tablet Take 10 mg by mouth daily.  Marland Kitchen buPROPion (WELLBUTRIN) 75 MG tablet TAKE 1/2 TABLET TWICE A DAY  . carvedilol (COREG) 25 MG tablet TAKE 1 TABLET (25 MG TOTAL) BY MOUTH 2 (TWO) TIMES DAILY WITH A MEAL. (Patient taking differently: Take 25  mg by mouth 2 (two) times daily with a meal. )  . doxazosin (CARDURA) 8 MG tablet TAKE 1 TABLET (8 MG TOTAL) BY MOUTH AT BEDTIME. (Patient taking differently: Take 8 mg by mouth at bedtime. )  . doxycycline (VIBRAMYCIN) 100 MG capsule Take 1 capsule (100 mg total) by mouth 2 (two) times daily. One po bid x 14 days (Patient taking differently: Take 100 mg by mouth 2 (two) times daily. 14 day course filled 02/28/17)  . EDURANT 25 MG TABS tablet TAKE 1 TABLET (25 MG TOTAL) BY MOUTH DAILY WITH BREAKFAST.  . Febuxostat (ULORIC) 80 MG TABS Take 80 mg by  mouth daily as needed (gout).   . ferric citrate (AURYXIA) 1 GM 210 MG(Fe) tablet Take 420 mg by mouth 3 (three) times daily with meals.  . hydrALAZINE (APRESOLINE) 25 MG tablet Take 1 tablet (25 mg total) by mouth 3 (three) times daily.  Marland Kitchen HYDROcodone-acetaminophen (NORCO) 5-325 MG tablet Take 1 tablet by mouth every 6 (six) hours as needed for severe pain.  Marland Kitchen levofloxacin (LEVAQUIN) 500 MG tablet 1 tablet every other day for 2 weeks starting 03/02/2017 (Patient taking differently: Take 500 mg by mouth every other day. 14 day course (#7 tablets) filled 02/28/17)  . pravastatin (PRAVACHOL) 40 MG tablet TAKE 1 TABLET (40 MG TOTAL) BY MOUTH EVERY EVENING.  Marland Kitchen TIVICAY 50 MG tablet TAKE 1 TABLET (50 MG TOTAL) BY MOUTH DAILY.  Marland Kitchen VIREAD 300 MG tablet TAKE 1 TABLET BY MOUTH ONCE A WEEK (Patient taking differently: TAKE 1 TABLET BY MOUTH ONCE A WEEK ON SUNDAYS)     Allergies: Allergies as of 03/09/2017  . (No Known Allergies)   Past Medical History:  Diagnosis Date  . Anemia   . Arthritis   . CHF (congestive heart failure) ([place])   . Chronic kidney disease    stage 3-4 CKD, followed by Dr. Moshe Cipro  . ESRD (end stage renal disease) ([place])    ESRD due to HTN started dialysis Feb 2016  . Gout   . Gout, unspecified 08/13/2009   Qualifier: Diagnosis of  By: Redmond Pulling  MD, Mateo Flow    . H1N1 influenza    March 2016  . History of gout   . History of syphilis   . HIV (human immunodeficiency virus infection) ([place])   . HIV infection ([place]) 1980's   on ART therapy since, followed by ID clinic, complicated  by neuropathy  . HTN (hypertension)   . Hyperlipidemia    hypertrygliceridemia determined ti be secondary to ART therpay  . Hypertension   . HYPERTENSION 05/08/2006  . 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 Pneumonia   . Rib fractures 01/2009  . Seizure disorder ([place])   . Seizures ([place])    last seizure was >5 years ago, pt has family history of seizures  . 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 Syphilis 1997   history of syphilis 1997  . Tobacco abuse     Family History: - Mother: HTN - Father: HTN, COPD  Social History:  - Smokes .25 pack/day x15 years - Denies alcohol or drug use - Lives alone, but has fiancee in town who he sees often   Review of Systems: A complete ROS was negative except as per HPI.   Physical Exam: Blood pressure (!) 190/85, pulse 89, temperature 99.8 F (37.7 C),  temperature source Oral, resp. rate 17, height 5\' 9"  (1.753 m), weight 260 lb (117.9 kg), SpO2 94 %. Physical Exam  Constitutional: He is oriented to person, place, and time. He appears well-developed and well-nourished.  HENT:  Head: Normocephalic and atraumatic.  Eyes: EOM are normal. Right eye exhibits no discharge. Left eye exhibits no discharge.  Cardiovascular: Normal rate, regular rhythm and normal heart sounds.   Pulmonary/Chest: Effort normal. No respiratory distress.  Abdominal: Soft. Bowel sounds are normal. He exhibits no distension.  Genitourinary:  Genitourinary Comments: Fluctuance and erythema at the R base of the penis and R upper scrotum.  Musculoskeletal: He exhibits no edema or deformity.  Neurological: He is alert and oriented to person, place, and time.  Skin: Skin is warm and dry.    EKG: personally reviewed my interpretation is:  Sinus rhythm with RSR' in V1 and Nonspecific T-wave abnormalities.  CXR: personally reviewed and I agree with cardiomegaly, mild interstitial edema, and suspected small right effusion.   Assessment & Plan by Problem:  Mr. Hanshaw is a 51 yo M with history of CHF (EF 55-60% on 09/28/14; ESRD on HD; Gout; HTN; HIV on HAART; Anemia; and Seizure presenting with scrotal abscess pain and swelling.  Scrotal Abscess: Has history of R inguinal abscess drain in February of 2018. Current symptoms for 2  weeks. First seen on 8/11 given Doxy, Levaquin, pain medication and Urology referral. Pain worsened significantly over the weekend even with pain medication. CT showed 11.4 x 5.9 fluid collection originating at the R penile base. Seen by urology in ED -> I&D scheduled for night of admission. Given Vancomycin and Zosyn in ED and considering his ESRD on HD will hold off on further antibiotics.  - Appreciate Urology recs - Vancomycin 2g and Zosyn 2.25g given in ED - Surgical Cultures to be obtained - Norco 5-325 PO q6h PRN - Dilaudid 0.5-1mg  IV q4h PRN - Hold Lasix - CBC in AM  ESRD: Patient on HD MWF. Was unable to complete Monday session due to pain, completed 1.5 hrs. - Consult nephrology for inpatient HD - Continue Calcitrol - BMP in AM  Anemia of CKD: Hgb 8.3, baseline range 8-10. - Continue ferric citrate  HTN: Blood pressure elevated to 180s/90s on admission. He had not taken his medications that day and had to stop dialysis early (completed 1.5 hrs). - Continue Amlodipine 10mg  Daily - Continue Hydralazine 25mg  TID - Continue Cardura 8mg  Daily - Hold Carvedilol 25mg  BID prior to surgery - Hold furosemide 40mg  Daily  HFpEF: Last echo 09/28/14 showed mildconcentric hypertrophy, EF 55% to 60%, Normal wall motion. - Hold lasix in the setting of infection with concern for developing hypotension - Hold Carvedilol prior to suregery  HIV: Last CD4 600 and Viral Load 28 on 10/01/16. - Continue Edurant 25mg  Daily - Continue Tvicay Daily - Continue Viread Weakly  Gout - Continue fuboxostat   FEN: NPO DVT Prophylaxis: SCDs Code Status: Full Code   Dispo: Admit patient to Inpatient with expected length of stay greater than 2 midnights.  Signed: Neva Seat, MD 03/09/2017, 10:13 PM  Pager: [phone]

## 2017-03-09 NOTE — Anesthesia Procedure Notes (Signed)
Procedure Name: Intubation Date/Time: 03/09/2017 9:43 PM Performed by: Marchelle Folks ANN Pre-anesthesia Checklist: Patient identified, Emergency Drugs available, Suction available, Patient being monitored and Timeout performed Patient Re-evaluated:Patient Re-evaluated prior to induction Oxygen Delivery Method: Circle system utilized Preoxygenation: Pre-oxygenation with 100% oxygen Induction Type: IV induction and Cricoid Pressure applied Ventilation: Mask ventilation without difficulty Laryngoscope Size: Mac and 3 Grade View: Grade II Tube type: Oral Tube size: 8.0 mm Number of attempts: 1 Airway Equipment and Method: Stylet Placement Confirmation: ETT inserted through vocal cords under direct vision,  positive ETCO2,  CO2 detector and breath sounds checked- equal and bilateral Secured at: 24 cm Tube secured with: Tape Dental Injury: Teeth and Oropharynx as per pre-operative assessment

## 2017-03-09 NOTE — ED Notes (Signed)
Ginger from 5W reprots pt BP high. Will give pt Norvasc to trend down BP.

## 2017-03-10 ENCOUNTER — Encounter (HOSPITAL_COMMUNITY): Payer: Self-pay | Admitting: Urology

## 2017-03-10 DIAGNOSIS — N492 Inflammatory disorders of scrotum: Principal | ICD-10-CM

## 2017-03-10 DIAGNOSIS — I120 Hypertensive chronic kidney disease with stage 5 chronic kidney disease or end stage renal disease: Secondary | ICD-10-CM

## 2017-03-10 DIAGNOSIS — N186 End stage renal disease: Secondary | ICD-10-CM

## 2017-03-10 DIAGNOSIS — B20 Human immunodeficiency virus [HIV] disease: Secondary | ICD-10-CM

## 2017-03-10 DIAGNOSIS — Z992 Dependence on renal dialysis: Secondary | ICD-10-CM

## 2017-03-10 LAB — BASIC METABOLIC PANEL
ANION GAP: 12 (ref 5–15)
BUN: 59 mg/dL — ABNORMAL HIGH (ref 6–20)
CALCIUM: 10 mg/dL (ref 8.9–10.3)
CO2: 26 mmol/L (ref 22–32)
CREATININE: 13.05 mg/dL — AB (ref 0.61–1.24)
Chloride: 94 mmol/L — ABNORMAL LOW (ref 101–111)
GFR calc Af Amer: 4 mL/min — ABNORMAL LOW (ref >60)
GFR calc non Af Amer: 4 mL/min — ABNORMAL LOW (ref >60)
GLUCOSE: 109 mg/dL — AB (ref 65–99)
Potassium: 7 mmol/L (ref 3.5–5.1)
Sodium: 132 mmol/L — ABNORMAL LOW (ref 135–145)

## 2017-03-10 LAB — CBC
HCT: 23.5 % — ABNORMAL LOW (ref 39.0–52.0)
HCT: 17.7 % — ABNORMAL LOW (ref 39.0–52.0)
Hemoglobin: 7.5 g/dL — ABNORMAL LOW (ref 13.0–17.0)
Hemoglobin: 5.7 g/dL — CL (ref 13.0–17.0)
MCH: 27.8 pg (ref 26.0–34.0)
MCH: 27.9 pg (ref 26.0–34.0)
MCHC: 31.9 g/dL (ref 30.0–36.0)
MCHC: 32.2 g/dL (ref 30.0–36.0)
MCV: 87 fL (ref 78.0–100.0)
MCV: 86.8 fL (ref 78.0–100.0)
PLATELETS: 219 10*3/uL (ref 150–400)
Platelets: 225 K/uL (ref 150–400)
RBC: 2.7 MIL/uL — AB (ref 4.22–5.81)
RBC: 2.04 MIL/uL — ABNORMAL LOW (ref 4.22–5.81)
RDW: 18 % — ABNORMAL HIGH (ref 11.5–15.5)
RDW: 17.7 % — ABNORMAL HIGH (ref 11.5–15.5)
WBC: 18.5 10*3/uL — AB (ref 4.0–10.5)
WBC: 22.7 K/uL — ABNORMAL HIGH (ref 4.0–10.5)

## 2017-03-10 LAB — POCT I-STAT, CHEM 8
BUN: 37 mg/dL — ABNORMAL HIGH (ref 6–20)
CALCIUM ION: 1.23 mmol/L (ref 1.15–1.40)
CHLORIDE: 95 mmol/L — AB (ref 101–111)
Creatinine, Ser: 8.1 mg/dL — ABNORMAL HIGH (ref 0.61–1.24)
Glucose, Bld: 135 mg/dL — ABNORMAL HIGH (ref 65–99)
HCT: 22 % — ABNORMAL LOW (ref 39.0–52.0)
HEMOGLOBIN: 7.5 g/dL — AB (ref 13.0–17.0)
POTASSIUM: 3.9 mmol/L (ref 3.5–5.1)
SODIUM: 135 mmol/L (ref 135–145)
TCO2: 28 mmol/L (ref 0–100)

## 2017-03-10 LAB — PREPARE RBC (CROSSMATCH)

## 2017-03-10 LAB — POTASSIUM: POTASSIUM: 4.3 mmol/L (ref 3.5–5.1)

## 2017-03-10 LAB — ABO/RH: ABO/RH(D): A POS

## 2017-03-10 MED ORDER — HEPARIN SODIUM (PORCINE) 1000 UNIT/ML DIALYSIS: 1000.0000 [IU] | INTRAMUSCULAR | Status: DC | PRN

## 2017-03-10 MED ORDER — VANCOMYCIN HCL IN DEXTROSE 1-5 GM/200ML-% IV SOLN
INTRAVENOUS | Status: AC
  Administered 2017-03-10: 1000 mg via INTRAVENOUS
  Filled 2017-03-10: qty 200

## 2017-03-10 MED ORDER — VANCOMYCIN HCL IN DEXTROSE 1-5 GM/200ML-% IV SOLN: 1000.0000 mg | INTRAVENOUS | Status: DC

## 2017-03-10 MED ORDER — LIDOCAINE-PRILOCAINE 2.5-2.5 % EX CREA: 1.0000 "application " | TOPICAL_CREAM | CUTANEOUS | Status: DC | PRN

## 2017-03-10 MED ORDER — SODIUM CHLORIDE 0.9 % IV SOLN: 100.0000 mL | INTRAVENOUS | Status: DC | PRN

## 2017-03-10 MED ORDER — VANCOMYCIN HCL IN DEXTROSE 1-5 GM/200ML-% IV SOLN
1000.0000 mg | INTRAVENOUS | Status: DC | PRN
  Administered 2017-03-10: 1000 mg via INTRAVENOUS
  Filled 2017-03-10: qty 200

## 2017-03-10 MED ORDER — PIPERACILLIN-TAZOBACTAM 3.375 G IVPB
3.3750 g | Freq: Two times a day (BID) | INTRAVENOUS | Status: DC
  Administered 2017-03-10 – 2017-03-12 (×4): 3.375 g via INTRAVENOUS
  Filled 2017-03-10 (×6): qty 50

## 2017-03-10 MED ORDER — DEXTROSE 50 % IV SOLN
1.0000 | Freq: Once | INTRAVENOUS | Status: AC
  Administered 2017-03-10: 50 mL via INTRAVENOUS
  Filled 2017-03-10: qty 50

## 2017-03-10 MED ORDER — SODIUM POLYSTYRENE SULFONATE 15 GM/60ML PO SUSP
30.0000 g | Freq: Once | ORAL | Status: AC
  Administered 2017-03-10: 30 g via ORAL
  Filled 2017-03-10: qty 120

## 2017-03-10 MED ORDER — LIDOCAINE HCL (PF) 1 % IJ SOLN: 5.0000 mL | INTRAMUSCULAR | Status: DC | PRN

## 2017-03-10 MED ORDER — INSULIN ASPART 100 UNIT/ML ~~LOC~~ SOLN
5.0000 [IU] | Freq: Once | SUBCUTANEOUS | Status: AC
  Administered 2017-03-10: 5 [IU] via SUBCUTANEOUS

## 2017-03-10 MED ORDER — ALTEPLASE 2 MG IJ SOLR
2.0000 mg | Freq: Once | INTRAMUSCULAR | Status: DC | PRN
Start: 2017-03-10 — End: 2017-03-10

## 2017-03-10 MED ORDER — SODIUM CHLORIDE 0.9 % IV SOLN: Freq: Once | INTRAVENOUS | Status: DC

## 2017-03-10 MED ORDER — PENTAFLUOROPROP-TETRAFLUOROETH EX AERO: 1.0000 "application " | INHALATION_SPRAY | CUTANEOUS | Status: DC | PRN

## 2017-03-10 NOTE — Progress Notes (Signed)
Pt noted to have bleeding from the op site on right scrotal area;immediately this Probation officer together with the charge nurse & 2 nurse techs changed and packed it with a whole new dressing (ABD pads & 3x12 bandage rolls) to control the bleeding; vital signs: BP-170/85;PR-70;T-98.4;RR-16 with O2 sats -100%;Pt is alert and oriented without dizziness or loss of consciousness;Pt is due for a scheduled hydralazine 25mg  at 6am.Monitoring to continue.

## 2017-03-10 NOTE — Consult Note (Signed)
           [facility] CM Primary Care Navigator  03/10/2017  Darrell Johnson 1965/08/27 [id number]   Went to see patient at the bedsideto identify possible discharge needs but staffreports that heis inhemodialysis at this time.  Will attemptto see patient at another time when he is available in the room.     Addendum:    Went back to see patient to identify possible needs at discharge.  Patient reports having worsening scrotal swelling and pain that hadled to this admission.  [facility]   [facility] [address] [facility] so far.   Patient's wife Darrell Johnson) manages his medications at home using "pill box" system filled weekly.  Patient reports that he is able to drive prior to admission but wife or son Darrell Johnson.) will be providing transportation to hisdoctors' appointments after discharge.  Patient states that wife will be his primary caregiver at home.  Discharge plan isundetermined pending treatment interventions and physician order. Patient verbalized hopes to discharge home.  Patient voiced understanding to call primary care provider's office when he returns back home for a post discharge follow-up appointment within a week or sooner if needs arise.Patient letter (with PCP's contact number) wasprovided as areminder.  Explained to patientregardingTHN CM services available for further healthmanagement. Patient expressedunderstanding to seekreferral to [facility] care managementfrom primary care provider ifdeemed necessary andappropriatefor services (on next follow-up visit).   [facility] care management information provided for future needs that he may have.  Nephrology NP Sabino Gasser) states will place an order for a referral for [facility] consult (pharmacy) regarding medication review,  reconciliation and management for patient.   For questions, please contact:  Dannielle Huh, BSN, RN- [facility] Primary Care Navigator  Telephone: [phone] [place]

## 2017-03-10 NOTE — Progress Notes (Signed)
Called to bedside by nurse for evaluation of patient's bleeding from post surgical wound. Wound undressed and the wound packing placed after I&D was removed to assess bleeding. A slow oozing of bright red blood was noted. Pressure was held for about 2 minutes but oozing continued. Patient's wound was repacked by nurse at bedside with gauze to help tamponade the wound. A stat CBC was ordered, as well as a type and screen.   The patient reported pain with wound dressing but also complained of dizziness, which has gotten worse since leaving dialysis. Since leaving dialysis the patient has been hypotensive (with reading of 79/40 during wound redressing). Will plan to hold anti-hypertensive medications 2/2 hypotension and can transfuse unit of blood if stat CBC returns less than 7.0.

## 2017-03-10 NOTE — Progress Notes (Signed)
CRITICAL VALUE ALERT  Critical Value: Potassium 7.0  Date & Time Notied:  03-10-17 / 5:10  Provider Notified: Dr Earley Favor   Orders Received/Actions taken: Yes / ordered Kayexalate 30 gms

## 2017-03-10 NOTE — Consult Note (Addendum)
Urology Progress Note  51yo M w/ hx CHF, CKD on HD, HIV, HTN and seizures who underwent penoscrotal abscess drainage under general anesthesia on 03/09/17 for a large abscess. Around 300cc of pus was evacuated from the abscess cavity.   Subjective: Patient has had significant issues with post operative oozing/bleeding today and overnight post operatively. Per nurse, x4 distinct episodes. Patient underwent dialysis this AM however all anticoagulation was held.  Hgb check 5.7 this PM from 7.5 earlier this AM. Recieveing 1U this PM.    Objective: Vital signs in last 24 hours: Temp:  [97.8 F (36.6 C)-99.8 F (37.7 C)] 99.1 F (37.3 C) (08/21 1322) Pulse Rate:  [53-110] 63 (08/21 1322) Resp:  [14-26] 14 (08/21 1322) BP: (66-190)/(38-96) 80/60 (08/21 1400) SpO2:  [94 %-100 %] 100 % (08/21 1146) Weight:  [112.7 kg (248 lb 7.3 oz)-117.9 kg (260 lb)] 112.7 kg (248 lb 7.3 oz) (08/21 1146)  Intake/Output from previous day: 08/20 0701 - 08/21 0700 In: 1030 [P.O.:480; IV Piggyback:550] Out: 10 [Blood:10] Intake/Output this shift: Total I/O In: -  Out: 541 [Other:541]  Physical Exam:  General: Alert and oriented CV: RRR Lungs: Clear Abdomen: Soft, nontender non distended.  GU: Right inguinoscrotal wound examined, no active bleeding noted. Pressure dressing in place. Wound repacked. Wound bed appears healthy, without necrotic or infected appearing tissue. No drain present  Ext: NT, No erythema  Lab Results:  Recent Labs  03/10/17 0348 03/10/17 0839 03/10/17 1405  HGB 7.5* 7.5* 5.7*  HCT 23.5* 22.0* 17.7*   BMET  Recent Labs  03/09/17 1125 03/10/17 0348 03/10/17 0839 03/10/17 1405  NA 132* 132* 135  --   K 4.1 7.0* 3.9 4.3  CL 92* 94* 95*  --   CO2 26 26  --   --   GLUCOSE 89 109* 135*  --   BUN 50* 59* 37*  --   CREATININE 10.96* 13.05* 8.10*  --   CALCIUM 9.7 10.0  --   --      Studies/Results: Ct Pelvis W Contrast  Result Date: 03/09/2017 CLINICAL DATA:  Scrotal  mass . Dialysis patient with pain for the past 2 weeks. EXAM: CT PELVIS WITH CONTRAST TECHNIQUE: Multidetector CT imaging of the pelvis was performed using the standard protocol following the bolus administration of intravenous contrast. CONTRAST:  173mL ISOVUE-300 IOPAMIDOL (ISOVUE-300) INJECTION 61% COMPARISON:  Ultrasound 02/28/2017.  CT of 09/29/2014. FINDINGS: Urinary Tract:  No distal hydroureter.  Normal urinary bladder. Bowel: Normal colon, appendix, and terminal ileum. Normal pelvic small bowel. Vascular/Lymphatic: Aortic and branch vessel atherosclerosis. Right external iliac node is enlarged at 1.4 cm on image 35/series 3. A right inguinal node measures 1.3 cm on image 45/series 3. Reproductive: Normal prostate. Small bilateral hydroceles. Edema throughout the scrotum. There is edema anterior to the symphysis pubis and cephalad to the penis on image 45/series 3. There is an irregularly-shaped fluid collection which is positioned at the base of the penis, eccentric right. This does cross the midline, measuring 5.9 x 11.4 cm on image 56/series 3. Displaces the corpora to the left. Other:  No significant free fluid. 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 IMPRESSION: 1. Fluid collection centered at the right penile base. Given the clinical history and adjacent scrotal and subcutaneous edema, favored to represent an abscess. 2. Pelvic adenopathy, likely reactive. Consider CT followup at 3 months to confirm resolution. 3.  Aortic Atherosclerosis (ICD10-I70.0). 4. Small bilateral hydroceles with diffuse scrotal edema. 5. Left femoral head  avascular necrosis. Electronically Signed   By: Abigail Miyamoto M.D.   On: 03/09/2017 13:46   Dg Chest Port 1 View  Result Date: 03/09/2017 CLINICAL DATA:  Preoperative film.  Hypertension. EXAM: PORTABLE CHEST 1 VIEW COMPARISON:  Chest radiograph 06/16/2016 FINDINGS: Unchanged cardiomegaly with bilateral interstitial opacities. No  pneumothorax. Suspected small right pleural effusion. IMPRESSION: Cardiomegaly and mild interstitial edema with suspected small right pleural effusion. Electronically Signed   By: Ulyses Jarred M.D.   On: 03/09/2017 18:30    Assessment/Plan:  51 y.o. male w/ hx CKD on HD, HIV s/p penoscrotal abscess drainaged 8/20. Post operatively has had issues with bleeding from incision site. This PM, wound examined and did not have evidence of active bleeding or oozing, likely tamponaded from pressure dressing.   - in HD, please hold all anticoagulation medicine as possible - perform WTD packing of wound BID with gauze covered by abd pad    - continue scrotal support with mesh panties  - should bleeding begin again, please change out dressing and repack wound using qtip and actually stuffing gauze into abscess bed (instead of just external pressure and gauze application). Urology will be available to assist as possible      LOS: 1 day   FILIPPOU, PAULINE L 03/10/2017, 4:39 PM I discussed the above patient with Dr. Shanon Brow, and agree with her assessment and plan.

## 2017-03-10 NOTE — Progress Notes (Signed)
Paged regarding potassium of 7.0. Ordered Kayexalate 30g, Insulin 5 units, 1 amp of D50. Stat EKG pending. Consulted nephrology, who will see patient this morning.   Pearson Grippe, DO IM PGY-1 Pager: [phone]

## 2017-03-10 NOTE — Progress Notes (Signed)
Subjective:  Darrell Johnson was seen laying in dialysis bed this AM. He states that he wasn't in any pain, as he received pain medication from his nurse prior to the start of dialysis. He states that the pain in his scrotum is improved, but he is wondering why the wound was left open and why he is bleeding so much. The patient had no other acute complaints. Denied chest pain, nausea, vomiting, and abdominal pain.  Objective:  Vital signs in last 24 hours: Vitals:   03/10/17 0915 03/10/17 0930 03/10/17 0946 03/10/17 1000  BP: (!) 82/45 94/65 (!) 150/96 101/66  Pulse: (!) 110 87 66 95  Resp:  16  (!) 26  Temp:      TempSrc:      SpO2:      Weight:      Height:       Physical Exam  Constitutional: He appears well-developed and well-nourished. No distress.  Cardiovascular: Normal rate, regular rhythm, normal heart sounds and intact distal pulses.   Pulmonary/Chest: Effort normal and breath sounds normal. No respiratory distress. He has no wheezes.  Abdominal: Soft. He exhibits no distension. There is no tenderness.  Genitourinary:  Genitourinary Comments: Patient had bandage in place over scrotum. Bandage had some red blood soaking through, but was still taped in place. Could not see wound packing material in open scrotal incision. Large, red clot of blood noted seeping out of the right side of the patient's bandage. No tenderness with palpation of the area. No erythema or warmth appreciated.  Skin: Skin is warm and dry. He is not diaphoretic. No erythema. No pallor.   Assessment/Plan:  Active Problems:   Scrotal abscess  Darrell Johnson is a 51 yo M with history of CHF (EF 55-60% on 09/28/14), ESRD on HD M/W/F, chronic anemai, and well controlled HIV (CD4 = 600, viral load = 28 on 09/2016) who presented to the ED with a 2 week history of worsening scrotal pain that failed outpatient management with doxycycline and levaquin. On admission he was taken to the OR for I&D procedure with urology. The  patient tolerated the procedure well and was admitted to the teaching service with urology and nephrology consulting. The specific problems addressed during admission are as follows:  Scrotal Abscess s/p I&D on 03/09/17: The patient's scrotal pain is much improved and there are no signs of cellulitis or remaining abscess on exam this morning. The urologist collected a gram stain and culture of fluid obtained during I&D procedure, which initially showed prominent gram + cocci and moderate gram - rods. He received broad spectrum antibiotics prior to operation and will continue these until culture results allow for narrowing of antibiotic therapy. The patient reported increased oozing from surgical site and had much blood with clotting during physical exam. Per op note, the patient's wound was packed in OR, but it appears that he has bled through this packaging. Will call urology for their recommendations and consult wound care if they think it would be helpful.  -Urology following, appreciate recommendations regarding post-operative wound care -Continue Vancomycin, zosyn for broad spectrum coverage -Follow up surgical cultures -Norco, 5-325 mg for pain management q 6 hours  ESRD with HD MWF: Patient was reportedly unable to complete dialysis on Monday 2/2 increased pain. He endorsed SOB which may be secondary to volume overload as a result of early discontinuation of dialysis. This is consistent with a CXR showing a R pleural effusion on admission. The patient's K was elevated to  7.0 overnight. His EKG did not show changes concerning for unstable myocardium. Patient was given Kayexalate and insulin, with a consult to nephrology for HD.  -Continue home calcitrol -BMP post dialysis shows K = 3.9 from 7.0, Cr decreased to 8.10 from 13.05, and BUN decreased to 37 from 59  Anemia of CKD: Per chart review patient's baseline Hgb between 8-10.  -Post operative CBC = 7.5, will continue to monitor with post op  bleeding -Continue home ferric citrate  HTN with HFpEF (echo 09/28/14 with EF 55-60%): BP high on admission 2/2 volume overload and missed medication doses. Patient was restarted on most home medications with carvedilol and furosemide held until after surgery. Continue to hold home furosemide for HF given soft BP and scrotal wound. -Continue amlodipine 10mg  qD, hydralazine 25mg  TID, cardura 8mg  qD, carvedilol BID  HIV: This problem is well controlled at baseline, with his last CD4 = 600 and Viral Load = 28 on 10/01/16. -Continue rilpivirine 25 mg aD, dolutegravir 50 mg qD, tenofovir 300mg  qSunday  Gout: No current signs or symptoms of gout flare. -Continue home fuboxostat 80 mg PRN  FEN/GI:  -Advance as tolerated after dialysis  DVT Prophylaxis: -SCDs while in bed, ambulate when possible  Dispo: Anticipated discharge in approximately 2-3 day(s).   Darrell Ripple, MD 03/10/2017, 10:54 AM Pager: [phone]

## 2017-03-10 NOTE — Telephone Encounter (Signed)
Patient is currently hospitalized.  I am waiting to approve these refills to be sure he does not have any medication changes upon discharge.

## 2017-03-10 NOTE — Procedures (Signed)
   I was present at this dialysis session, have reviewed the session itself and made  appropriate changes Kelly Splinter MD [place] pager [phone]   03/10/2017, 3:49 PM

## 2017-03-10 NOTE — Consult Note (Signed)
[facility] Renal Consultation Note    Indication for Consultation:  Management of ESRD/hemodialysis; anemia, hypertension/volume and secondary hyperparathyroidism PCP:  HPI: Darrell Johnson is a 51 y.o. male with ESRD on hemodialysis MWF at [facility]. PMH significant for HIV, HTN,CHF, morbid obesity, HLD, gout, STDS, seizure disorder, ACOD, SHPT. Last HD 03/09/17 ran 1 hour 43 minutes of 4 hour 15 minute treatment. Left 3.2 above EDW. H/O early sign offs, high IDWG.   Patient was admitted 03/09/17 for scrotal abscess pain and swelling. Was treated as OP on doxycycline and levaquin, however condition worsened. He presented to ED for evaluation and was seen by urology. He had incision and drainage of scrotal access per Dr. Shirley Muscat yesterday. Wound cultures positive for gram positive cocci and gram negative rods. He has been started on vancomycin and zosyn per primary. Early this AM K+ was found to 7.0. He rec'd 60 G kayexelate and he had urgent HD for hyperkalemia this AM.   Patient oozing serosanganous fluid from scrotal incision. Urology has been notified, packing removed. Attempting to tamponade wound with  gauze until urology arrives to assess. Initially appeared to be serosanganious drainage, however after packing removed, he continued to ooze sanguineous drainage. Pressure held at site per Rapid Response RN and pressure drsg has been applied. BP is low 79/49. Has not rec'd coreg or antihypertensive meds today. He C/O pain when pressure was being held at scrotal incision site but otherwise, no complaints. Mild dizziness when being turned. Says he left HD Monday early because he could not sit in chair.   Of note, AF was down to 895 from 1166 in July, 2018. Concerned that this may have played a role in hyperkalemia.   Past Medical History:  Diagnosis Date  . Anemia   . Arthritis   . CHF (congestive heart failure) ([place])   . Chronic kidney disease    stage  3-4 CKD, followed by Dr. Moshe Cipro  . ESRD (end stage renal disease) ([place])    ESRD due to HTN started dialysis Feb 2016  . Gout   . Gout, unspecified 08/13/2009   Qualifier: Diagnosis of  By: Redmond Pulling  MD, Mateo Flow    . H1N1 influenza    March 2016  . History of gout   . History of syphilis   . HIV (human immunodeficiency virus infection) ([place])   . HIV infection ([place]) 1980's   on ART therapy since, followed by ID clinic, complicated  by neuropathy  . HTN (hypertension)   . Hyperlipidemia    hypertrygliceridemia determined ti be secondary to ART therpay  . Hypertension   . HYPERTENSION 05/08/2006  . 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 Pneumonia   . Rib fractures 01/2009  . Seizure disorder ([place])   . Seizures ([place])    last seizure was >5 years ago, pt has family history of seizures  . 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 Syphilis 1997   history of syphilis 1997  . Tobacco abuse    Past Surgical History:  Procedure Laterality Date  . ABSCESS DRAINAGE    . AV FISTULA PLACEMENT Right 12/02/2013   Procedure: ARTERIOVENOUS (AV) FISTULA CREATION;  Surgeon: Rosetta Posner, MD;  Location: [place];  Service: Vascular;  Laterality: Right;  . INCISION AND DRAINAGE ABSCESS Right 03/09/2017   Procedure: INCISION AND DRAINAGE  Right Scrotal Abscess;  Surgeon: Franchot Gallo, MD;  Location: [place];  Service: Urology;  Laterality: Right;   Family History  Problem Relation Age of Onset  . Cancer Mother   . Hypertension Mother   . COPD Father   . Hypertension Father   . Diabetes Sister   . Hypertension Sister   . Diabetes Brother   . Hypertension Brother   . Stroke Neg Hx    Social History:  reports that he quit smoking about 11 months ago. He started smoking about 13 months ago. He has a 8.00 pack-year smoking history. He has never used smokeless  tobacco. He reports that he does not drink alcohol or use drugs. No Known Allergies Prior to Admission medications   Medication Sig Start Date End Date Taking? Authorizing Provider  acetaminophen (TYLENOL) 500 MG tablet Take 1,000 mg by mouth every 6 (six) hours as needed for mild pain.   Yes [provider]  albuterol (PROVENTIL HFA;VENTOLIN HFA) 108 (90 Base) MCG/ACT inhaler Inhale 2 puffs into the lungs every 6 (six) hours as needed for wheezing or shortness of breath. 10/02/16  Yes Jule Ser, DO  albuterol (PROVENTIL) (2.5 MG/3ML) 0.083% nebulizer solution Take 3 mLs (2.5 mg total) by nebulization every 6 (six) hours as needed for wheezing or shortness of breath. 09/04/16  Yes Jule Ser, DO  amLODipine (NORVASC) 5 MG tablet Take 10 mg by mouth daily. 12/06/16  Yes [provider]  buPROPion (WELLBUTRIN) 75 MG tablet TAKE 1/2 TABLET TWICE A DAY 02/10/17  Yes Jule Ser, DO  carvedilol (COREG) 25 MG tablet TAKE 1 TABLET (25 MG TOTAL) BY MOUTH 2 (TWO) TIMES DAILY WITH A MEAL. Patient taking differently: Take 25 mg by mouth 2 (two) times daily with a meal.  09/11/16  Yes Jule Ser, DO  doxazosin (CARDURA) 8 MG tablet TAKE 1 TABLET (8 MG TOTAL) BY MOUTH AT BEDTIME. Patient taking differently: Take 8 mg by mouth at bedtime.  09/11/16  Yes Jule Ser, DO  doxycycline (VIBRAMYCIN) 100 MG capsule Take 1 capsule (100 mg total) by mouth 2 (two) times daily. One po bid x 14 days Patient taking differently: Take 100 mg by mouth 2 (two) times daily. 14 day course filled 02/28/17 02/28/17  Yes Jacubowitz, Sam, MD  EDURANT 25 MG TABS tablet TAKE 1 TABLET (25 MG TOTAL) BY MOUTH DAILY WITH BREAKFAST. 12/22/16  Yes Carlyle Basques, MD  Febuxostat (ULORIC) 80 MG TABS Take 80 mg by mouth daily as needed (gout).    Yes [provider]  ferric citrate (AURYXIA) 1 GM 210 MG(Fe) tablet Take 420 mg by mouth 3 (three) times daily with meals.   Yes [provider]   hydrALAZINE (APRESOLINE) 25 MG tablet Take 1 tablet (25 mg total) by mouth 3 (three) times daily. 11/24/16  Yes Jule Ser, DO  HYDROcodone-acetaminophen (NORCO) 5-325 MG tablet Take 1 tablet by mouth every 6 (six) hours as needed for severe pain. 03/05/17  Yes Santos-Sanchez, Merlene Morse, MD  levofloxacin (LEVAQUIN) 500 MG tablet 1 tablet every other day for 2 weeks starting 03/02/2017 Patient taking differently: Take 500 mg by mouth every other day. 14 day course (#7 tablets) filled 02/28/17 02/28/17  Yes Winfred Leeds, Sam, MD  pravastatin (PRAVACHOL) 40 MG tablet TAKE 1 TABLET (40 MG TOTAL) BY MOUTH EVERY EVENING. 01/27/17 01/27/18 Yes Jule Ser, DO  TIVICAY 50 MG tablet TAKE 1 TABLET (50 MG TOTAL) BY MOUTH DAILY. 12/22/16  Yes Carlyle Basques, MD  VIREAD 300 MG tablet  TAKE 1 TABLET BY MOUTH ONCE A WEEK Patient taking differently: TAKE 1 TABLET BY MOUTH ONCE A WEEK ON SUNDAYS 01/06/17  Yes Carlyle Basques, MD  calcitRIOL (ROCALTROL) 0.5 MCG capsule Take by mouth.    [provider]  furosemide (LASIX) 40 MG tablet Take 1 tablet (40 mg total) by mouth daily. 09/11/16   Jule Ser, DO  montelukast (SINGULAIR) 10 MG tablet Take 1 tablet (10 mg total) by mouth at bedtime. 12/17/16   Jule Ser, DO   Current Facility-Administered Medications  Medication Dose Route Frequency Provider Last Rate Last Dose  . 0.9 %  sodium chloride infusion  100 mL Intravenous PRN Corliss Parish, MD      . 0.9 %  sodium chloride infusion  100 mL Intravenous PRN Corliss Parish, MD      . acetaminophen (TYLENOL) tablet 1,000 mg  1,000 mg Oral Q6H PRN Velna Ochs, MD      . albuterol (PROVENTIL) (2.5 MG/3ML) 0.083% nebulizer solution 2.5 mg  2.5 mg Nebulization Q6H PRN Velna Ochs, MD      . alteplase (CATHFLO ACTIVASE) injection 2 mg  2 mg Intracatheter Once PRN Corliss Parish, MD      . amLODipine (NORVASC) tablet 10 mg  10 mg Oral Daily Velna Ochs, MD      . bisacodyl  (DULCOLAX) suppository 10 mg  10 mg Rectal Daily PRN Velna Ochs, MD      . buPROPion [facility]) tablet 37.5 mg  37.5 mg Oral BID WC Velna Ochs, MD      . calcitRIOL (ROCALTROL) capsule 0.5 mcg  0.5 mcg Oral Daily Velna Ochs, MD      . carvedilol (COREG) tablet 25 mg  25 mg Oral BID WC Velna Ochs, MD      . dolutegravir (TIVICAY) tablet 50 mg  50 mg Oral Daily Velna Ochs, MD      . doxazosin (CARDURA) tablet 8 mg  8 mg Oral Daily Velna Ochs, MD      . febuxostat (ULORIC) tablet 80 mg  80 mg Oral Daily PRN Velna Ochs, MD      . ferric citrate (AURYXIA) tablet 210 mg  210 mg Oral TID WC Velna Ochs, MD      . heparin injection 1,000 Units  1,000 Units Dialysis PRN Corliss Parish, MD      . hydrALAZINE (APRESOLINE) tablet 25 mg  25 mg Oral TID Velna Ochs, MD   25 mg at 03/10/17 3220  . HYDROcodone-acetaminophen (NORCO/VICODIN) 5-325 MG per tablet 1 tablet  1 tablet Oral Q6H PRN Velna Ochs, MD      . HYDROmorphone (DILAUDID) injection 0.5-1 mg  0.5-1 mg Intravenous Q4H PRN Velna Ochs, MD   1 mg at 03/10/17 0615  . lidocaine (PF) (XYLOCAINE) 1 % injection 5 mL  5 mL Intradermal PRN Corliss Parish, MD      . lidocaine-prilocaine (EMLA) cream 1 application  1 application Topical PRN Corliss Parish, MD      . montelukast (SINGULAIR) tablet 10 mg  10 mg Oral QHS Velna Ochs, MD      . ondansetron [name]) tablet 4 mg  4 mg Oral Q6H PRN Velna Ochs, MD       Or  . ondansetron [facility]) injection 4 mg  4 mg Intravenous Q6H PRN Velna Ochs, MD   4 mg at 03/09/17 2149  . pentafluoroprop-tetrafluoroeth (GEBAUERS) aerosol 1 application  1 application Topical PRN Corliss Parish, MD      . piperacillin-tazobactam (ZOSYN) IVPB 3.375  g  3.375 g Intravenous Q12H Nedrud, Larena Glassman, MD      . pravastatin (PRAVACHOL) tablet 40 mg  40 mg Oral QPM Velna Ochs, MD      . rilpivirine [facility]) tablet  25 mg  25 mg Oral Q breakfast Velna Ochs, MD      . senna-docusate (Senokot-S) tablet 1 tablet  1 tablet Oral QHS PRN Velna Ochs, MD      . Derrill Memo ON 03/15/2017] tenofovir (VIREAD) tablet 300 mg  300 mg Oral Q Vallarie Mare, MD       Labs: Basic Metabolic Panel:  Recent Labs Lab 03/09/17 1125 03/10/17 0348 03/10/17 0839  NA 132* 132* 135  K 4.1 7.0* 3.9  CL 92* 94* 95*  CO2 26 26  --   GLUCOSE 89 109* 135*  BUN 50* 59* 37*  CREATININE 10.96* 13.05* 8.10*  CALCIUM 9.7 10.0  --    Liver Function Tests:  Recent Labs Lab 03/09/17 1125  AST 22  ALT 17  ALKPHOS 140*  BILITOT 0.8  PROT 7.6  ALBUMIN 2.8*   No results for input(s): LIPASE, AMYLASE in the last 168 hours. No results for input(s): AMMONIA in the last 168 hours. CBC:  Recent Labs Lab 03/09/17 1125 03/10/17 0348 03/10/17 0839  WBC 13.5* 18.5*  --   NEUTROABS 11.1*  --   --   HGB 8.3* 7.5* 7.5*  HCT 25.0* 23.5* 22.0*  MCV 85.9 87.0  --   PLT 230 219  --    Cardiac Enzymes: No results for input(s): CKTOTAL, CKMB, CKMBINDEX, TROPONINI in the last 168 hours. CBG: No results for input(s): GLUCAP in the last 168 hours. Iron Studies: No results for input(s): IRON, TIBC, TRANSFERRIN, FERRITIN in the last 72 hours. Studies/Results: Ct Pelvis W Contrast  Result Date: 03/09/2017 CLINICAL DATA:  Scrotal mass . Dialysis patient with pain for the past 2 weeks. EXAM: CT PELVIS WITH CONTRAST TECHNIQUE: Multidetector CT imaging of the pelvis was performed using the standard protocol following the bolus administration of intravenous contrast. CONTRAST:  169mL ISOVUE-300 IOPAMIDOL (ISOVUE-300) INJECTION 61% COMPARISON:  Ultrasound 02/28/2017.  CT of 09/29/2014. FINDINGS: Urinary Tract:  No distal hydroureter.  Normal urinary bladder. Bowel: Normal colon, appendix, and terminal ileum. Normal pelvic small bowel. Vascular/Lymphatic: Aortic and branch vessel atherosclerosis. Right external iliac node is  enlarged at 1.4 cm on image 35/series 3. A right inguinal node measures 1.3 cm on image 45/series 3. Reproductive: Normal prostate. Small bilateral hydroceles. Edema throughout the scrotum. There is edema anterior to the symphysis pubis and cephalad to the penis on image 45/series 3. There is an irregularly-shaped fluid collection which is positioned at the base of the penis, eccentric right. This does cross the midline, measuring 5.9 x 11.4 cm on image 56/series 3. Displaces the corpora to the left. Other:  No significant free fluid. 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 IMPRESSION: 1. Fluid collection centered at the right penile base. Given the clinical history and adjacent scrotal and subcutaneous edema, favored to represent an abscess. 2. Pelvic adenopathy, likely reactive. Consider CT followup at 3 months to confirm resolution. 3.  Aortic Atherosclerosis (ICD10-I70.0). 4. Small bilateral hydroceles with diffuse scrotal edema. 5. Left femoral head avascular necrosis. Electronically Signed   By: Abigail Miyamoto M.D.   On: 03/09/2017 13:46   Dg Chest Port 1 View  Result Date: 03/09/2017 CLINICAL DATA:  Preoperative film.  Hypertension. EXAM: PORTABLE CHEST 1 VIEW COMPARISON:  Chest radiograph 06/16/2016  FINDINGS: Unchanged cardiomegaly with bilateral interstitial opacities. No pneumothorax. Suspected small right pleural effusion. IMPRESSION: Cardiomegaly and mild interstitial edema with suspected small right pleural effusion. Electronically Signed   By: Ulyses Jarred M.D.   On: 03/09/2017 18:30    ROS: As per HPI otherwise negative.   Physical Exam: Vitals:   03/10/17 1030 03/10/17 1100 03/10/17 1130 03/10/17 1146  BP: (!) 100/45 (!) 95/56 (!) 98/56 (!) 105/52  Pulse: 92 88 89 88  Resp: 18 (!) 23 16 14   Temp:    (P) 99.2 F (37.3 C)  TempSrc:    Oral  SpO2:    (P) 100%  Weight:      Height:         General: Well developed, well nourished, in no acute  distress. Head: Normocephalic, atraumatic, sclera non-icteric, mucus membranes are moist Neck: Supple. JVD not elevated. Lungs: Clear bilaterally to auscultation without wheezes, rales, or rhonchi. Breathing is unlabored. Heart: RRR with S1 S2. No murmurs, rubs, or gallops appreciated. Abdomen: Soft, non-tender, non-distended with normoactive bowel sounds. No rebound/guarding. No obvious abdominal masses. Open incision R side of scrotum. Initially appeared to be drainage serosanguinous drainage but after packing removed, there is a steady slow ooze of sanguinous drainage.  M-S:  Strength and tone appear normal for age. Lower extremities:without edema or ischemic changes, no open wounds  Neuro: Alert and oriented X 3. Moves all extremities spontaneously. Psych:  Responds to questions appropriately with a normal affect. Dialysis Access: RFA AVF + Bruit  Dialysis Orders: [facility] MWF 4 hrs 15 min 200 NRe 450/800 111.5 kg 2.0 K/ 2.5 Ca  -Heparin 6000 units initial bolus IV 3000 units IV mid run TIW  -Hectorol 8 mcg IV TIW (last PTH 605 02/11/17) -Mircera 60 mcg IV q 2 weeks (Last dose 02/27/17 Last HGB 8.5 03/04/17) -Venofer 50 mg IV weekly (last dose 03/04/17 Fe 89 Tsat 35% 02/11/17)   Assessment/Plan: 1.  Scrotal Abcess: I & D per urology yesterday. WBC 18.5 GPC and GNR on WC. Vanc/Zosyn per primary 2.  Hyperkalemia: Shorted HD 03/09/17 D/T being unable to sit in chair. Kayexalate and urgent HD today.  3.  Anemia of CKD and ABL: Oozing from scrotal wound. HGB 8.5 at [place] 03/04/17 now 7.5. Getting stat CBC.Will transfuse 1 unit PRBCs now. 4.  ESRD - MWF. HD today off schedule short HD tomorrow to get back on schedule.  5.  Hypertension/volume  - Pre wt 113.1 kg Net UF 541 Post wt 112.7 kg. Rarely gets to OP EDW D/T early sign offs but closest he has came to current EDW is 112.7 kg. Consider raising EDW to 112 kg. BP on low side. usually on amlodipine 10 mg PO q HS, hydralazine 10 mg PO TID   and Coreg 25 mg PO BID however OP med list totally different. Cardura 8 mg PO on OP med list. Hydralazine is at higher dose.  Hold antihypertensive meds for SBP < 90.  6.  Anemia  - HGB 7.5. Recheck HGB, may need to tranfuse. ESA dose due this week. Give Aranesp 200 mcg IV with HD tomorrow. Hold venofer until source of infection identified.  7.  Metabolic bone disease - Ca 10. Continue binders, decrease to 2.0 Ca bath. Cont VDRA. Check renal panel tomorrow.  8.  Nutrition - Renal diet with fld restrictions.  9.   HIV: continue meds per primary 10. H/O diastolic HF. Monitor volume. Continue coreg when BP stable.  11. AVF: AF down  to 895 02/18/17 down from 1166 01/21/17. If patient has recurrent hyperkalemia will send to IR for fistulagram. Otherwise, refer to [facility] as OP.   Rita H. Owens Shark, NP-C 03/10/2017, 12:47 PM  [place] [phone]  Pt seen, examined and agree w A/P as above. ESRD pt with HIV, HTN and new scrotal abscess, sp I&D today.  Bleeding post-op from wounds and Hb dropping, with low BP's.  Have ordered 1 unit prbc's.  Is on for HD tomorrow again if needs more prbc's.  Will follow.  Kelly Splinter MD [employer] pager [phone]   03/10/2017, 3:45 PM

## 2017-03-10 NOTE — Progress Notes (Signed)
Writer called into hemodialysis this AM due to patient bleeding from scrotum.  Called attending Physician.  Together we reinforced the dressing. Attending MD to contact urology. Patient returned from hemodialysis to the floor. Pt. Actively bleeding from scrotum again, urology has not come by to see the patient yet.  Called the attending physician again and let her know the patient's status.  Paged sent to urology as well. Reinforced the dressing again.  Awaiting call back from Urology.

## 2017-03-10 NOTE — Significant Event (Addendum)
Rapid Response Event Note  Overview: Time Called: 1610 Arrival Time: 1310 Event Type: Other (Comment)  Initial Focused Assessment: Patient with oozing saturated dressing on wound.  Patient with I&D last night.  Per staff the site has been bleeding since last night.  He had HD this am.  Patient states he feels dizzy and weak. BP 79/43  HR 85  RR 16 O2 sat 100%  Interventions: Dressing  And packing removed,  Large amount of coagulated blood removed. Wound with blood continuously pooling in wound. Manual pressure held x 30 min Wound redressed with pressure dressing.  Stat Labs done (CBC and Type and Cross)   Plan of Care (if not transferred): 1 unit PRBC Urology to assess wound this afternoon  RN to call if bleeding continues or if BP does not improve with PRBC Patient to remain in bed until status improves   Addendum: Hgb 5.7 1 u PRBC BP 119/54  HR 72  Event Summary: Name of Physician Notified: Dr Eppie Gibson and Velva Harman nephrology NP at bedside at    Name of Consulting Physician Notified: urology at 1310  Outcome: Stayed in room and stabalized  Event End Time: [name]  Raliegh Ip

## 2017-03-10 NOTE — Progress Notes (Signed)
CRITICAL VALUE ALERT  Critical Value:  Hemoglobin 5.7 Date & Time Notied: 03/10/17 3:35pm  Provider Notified: Dr. Berneice Gandy  Orders Received/Actions taken: Blood transfusion orders

## 2017-03-10 NOTE — Progress Notes (Signed)
Internal Medicine Attending  Date: 03/10/2017  Patient name: Darrell Johnson Medical record number: [id number] Date of birth: Oct 28, 1965 Age: 51 y.o. Gender: male  I saw and evaluated the patient. I reviewed the resident's note by Dr. Berneice Gandy and I agree with the resident's findings and plans as documented in her progress note.  Please see my H&P dated 03/10/2017 and attached to Dr. Tally Joe H&P dated 03/09/2017 for the specifics of my evaluation, assessment, and plan from earlier in the day. He continues to ooze from his surgical wound. We are packing with gauze in hopes of tamponoding. We are checking a CBC to assure stability. If the hemoglobin drops below 7.0 we will transfuse 1 unit of packed red blood cells. We are also holding his antihypertensives. Finally, we're waiting the results of the intraoperative cultures. Urology will reassess the wound this afternoon.

## 2017-03-11 ENCOUNTER — Encounter (HOSPITAL_COMMUNITY): Payer: Self-pay | Admitting: *Deleted

## 2017-03-11 LAB — CBC
HCT: 17.8 % — ABNORMAL LOW (ref 39.0–52.0)
Hemoglobin: 5.8 g/dL — CL (ref 13.0–17.0)
MCH: 27.5 pg (ref 26.0–34.0)
MCHC: 32.6 g/dL (ref 30.0–36.0)
MCV: 84.4 fL (ref 78.0–100.0)
Platelets: 197 K/uL (ref 150–400)
RBC: 2.11 MIL/uL — ABNORMAL LOW (ref 4.22–5.81)
RDW: 17.7 % — ABNORMAL HIGH (ref 11.5–15.5)
WBC: 14.8 K/uL — ABNORMAL HIGH (ref 4.0–10.5)

## 2017-03-11 LAB — BASIC METABOLIC PANEL
Anion gap: 13 (ref 5–15)
BUN: 43 mg/dL — AB (ref 6–20)
CALCIUM: 9.3 mg/dL (ref 8.9–10.3)
CHLORIDE: 92 mmol/L — AB (ref 101–111)
CO2: 30 mmol/L (ref 22–32)
CREATININE: 10.37 mg/dL — AB (ref 0.61–1.24)
GFR calc non Af Amer: 5 mL/min — ABNORMAL LOW (ref >60)
GFR, EST AFRICAN AMERICAN: 6 mL/min — AB (ref >60)
Glucose, Bld: 97 mg/dL (ref 65–99)
Potassium: 3.8 mmol/L (ref 3.5–5.1)
SODIUM: 135 mmol/L (ref 135–145)

## 2017-03-11 LAB — PREPARE RBC (CROSSMATCH)

## 2017-03-11 MED ORDER — HYDRALAZINE HCL 25 MG PO TABS
25.0000 mg | ORAL_TABLET | Freq: Three times a day (TID) | ORAL | Status: DC
  Administered 2017-03-12 (×2): 25 mg via ORAL
  Filled 2017-03-11 (×2): qty 1

## 2017-03-11 MED ORDER — VANCOMYCIN HCL IN DEXTROSE 1-5 GM/200ML-% IV SOLN
1000.0000 mg | INTRAVENOUS | Status: DC
  Administered 2017-03-11: 1000 mg via INTRAVENOUS
  Filled 2017-03-11: qty 200

## 2017-03-11 MED ORDER — DARBEPOETIN ALFA 200 MCG/0.4ML IJ SOSY
200.0000 ug | PREFILLED_SYRINGE | INTRAMUSCULAR | Status: DC
  Administered 2017-03-11: 200 ug via INTRAVENOUS
  Filled 2017-03-11: qty 0.4

## 2017-03-11 MED ORDER — SODIUM CHLORIDE 0.9 % IV SOLN: 100.0000 mL | INTRAVENOUS | Status: DC | PRN

## 2017-03-11 MED ORDER — LIDOCAINE-PRILOCAINE 2.5-2.5 % EX CREA: 1.0000 "application " | TOPICAL_CREAM | CUTANEOUS | Status: DC | PRN

## 2017-03-11 MED ORDER — CARVEDILOL 25 MG PO TABS
25.0000 mg | ORAL_TABLET | Freq: Two times a day (BID) | ORAL | Status: DC
  Administered 2017-03-11 – 2017-03-12 (×3): 25 mg via ORAL
  Filled 2017-03-11 (×3): qty 1

## 2017-03-11 MED ORDER — PENTAFLUOROPROP-TETRAFLUOROETH EX AERO: 1.0000 "application " | INHALATION_SPRAY | CUTANEOUS | Status: DC | PRN

## 2017-03-11 MED ORDER — VANCOMYCIN HCL IN DEXTROSE 1-5 GM/200ML-% IV SOLN: 1000.0000 mg | INTRAVENOUS | Status: DC

## 2017-03-11 MED ORDER — LIDOCAINE HCL (PF) 1 % IJ SOLN: 5.0000 mL | INTRAMUSCULAR | Status: DC | PRN

## 2017-03-11 MED ORDER — DARBEPOETIN ALFA 200 MCG/0.4ML IJ SOSY
PREFILLED_SYRINGE | INTRAMUSCULAR | Status: AC
  Filled 2017-03-11: qty 0.4

## 2017-03-11 MED ORDER — AMLODIPINE BESYLATE 10 MG PO TABS
10.0000 mg | ORAL_TABLET | Freq: Every day | ORAL | Status: DC
  Administered 2017-03-11 – 2017-03-12 (×2): 10 mg via ORAL
  Filled 2017-03-11 (×2): qty 1

## 2017-03-11 MED ORDER — SODIUM CHLORIDE 0.9 % IV SOLN: Freq: Once | INTRAVENOUS | Status: DC

## 2017-03-11 NOTE — Progress Notes (Signed)
2 Days Post-Op Subjective: Patient reports feeling much better. Says bleeding from wound has decreased.  Objective: Vital signs in last 24 hours: Temp:  [98.1 F (36.7 C)-99.3 F (37.4 C)] 98.1 F (36.7 C) (08/22 1540) Pulse Rate:  [80-94] 93 (08/22 1540) Resp:  [16-20] 18 (08/22 1540) BP: (111-174)/(46-84) 164/77 (08/22 1540) SpO2:  [96 %-100 %] 100 % (08/22 1540) Weight:  [111.9 kg (246 lb 11.1 oz)-113.9 kg (251 lb 1.7 oz)] 113.9 kg (251 lb 1.7 oz) (08/22 1540)  Intake/Output from previous day: 08/21 0701 - 08/22 0700 In: 384 [Blood:384] Out: 541  Intake/Output this shift: No intake/output data recorded.  Physical Exam:  Constitutional: Vital signs reviewed. WD WN in NAD   Eyes: PERRL, No scleral icterus.   Pulmonary/Chest: Normal effort Abdominal: Soft. Non-tender, non-distended, bowel sounds are normal, no masses, organomegaly, or guarding present.  Genitourinary: Scrotal wound dry. No bleeding, less induration. Extremities: No cyanosis or edema   Lab Results:  Recent Labs  03/10/17 0839 03/10/17 1405 03/11/17 1130  HGB 7.5* 5.7* 5.8*  HCT 22.0* 17.7* 17.8*   BMET  Recent Labs  03/10/17 0348 03/10/17 0839 03/10/17 1405 03/11/17 1130  NA 132* 135  --  135  K 7.0* 3.9 4.3 3.8  CL 94* 95*  --  92*  CO2 26  --   --  30  GLUCOSE 109* 135*  --  97  BUN 59* 37*  --  43*  CREATININE 13.05* 8.10*  --  10.37*  CALCIUM 10.0  --   --  9.3   No results for input(s): LABPT, INR in the last 72 hours. No results for input(s): LABURIN in the last 72 hours. Results for orders placed or performed during the hospital encounter of 03/09/17  Aerobic/Anaerobic Culture (surgical/deep wound)     Status: None (Preliminary result)   Collection Time: 03/09/17  9:47 PM  Result Value Ref Range Status   Specimen Description ABSCESS  Final   Special Requests RIGHT SCROTAL  Final   Gram Stain   Final    ABUNDANT WBC PRESENT,BOTH PMN AND MONONUCLEAR ABUNDANT GRAM POSITIVE  COCCI MODERATE GRAM NEGATIVE RODS    Culture CULTURE REINCUBATED FOR BETTER GROWTH  Final   Report Status PENDING  Incomplete    Studies/Results: No results found.  Assessment/Plan:   POD 2 drainage of deep scrotal abscess. Doinng better, wound w/ less drainage.  I'll consult WOC service to teach wound care, and will arrange followup as outpt w/ [place].   LOS: 2 days   Franchot Gallo M 03/11/2017, 7:04 PM

## 2017-03-11 NOTE — Progress Notes (Signed)
Old dressing removed and area repacked with moist gauze, and covered with ABD/mesh underwear to hold in place . Area appears red with minimal purulent drainage and no signs of any active bleeding. 1 mg of IV dilaudid given before to help make pt more comfortable. Pt tolerated process well and is resting comfortably in bed with call-bell in reach. Will continue to monitor

## 2017-03-11 NOTE — Progress Notes (Signed)
Second unit of blood transfused. No signs or symptoms of reaction. VSS and post H&H lab draw ordered. Pt resting comfortably in bed. Will continue to monitor

## 2017-03-11 NOTE — Consult Note (Signed)
   [facility] Inpatient Consult   03/11/2017  Othell Jaime Baugher 17-Feb-1966 [id number]    [facility] Care Management referral received for coordination of care. [facility] Primary Care Navigator visited Mr. Brule on yesterday. Spoke with [facility] Primary Care Navigator prior to engaging patient at bedside. Please refer to Primary Care Navigator notes from 03/10/17 for further details.  Spoke with Mr. Ferdig to make aware that MD placed referral for care coordination and medication review. Discussed follow up from [place] and [place]. He is agreeable and [facility] Care Management written consent obtained.   Confirmed best contact number as [phone]. Provided [facility] Care Management packet.   Explained that [place] Management will not interfere or replace services provided by home health should he have it.   Will make inpatient RNCM aware [place] Management to follow post discharge.  Will make referral for [facility] RNCM and [facility] Pharmacist.  Marthenia Rolling, [place], RN,BSN [facility] Liaison [phone]

## 2017-03-11 NOTE — Progress Notes (Signed)
Follow up: Site assessed, clean/dry/intact, no evidence of bleeding. Patient just finished receiving one unit of blood.

## 2017-03-11 NOTE — Progress Notes (Signed)
[facility] Progress Note  Subjective: feeling much better, no further excessive bleeding overnight.    Vitals:   03/11/17 1430 03/11/17 1437 03/11/17 1457 03/11/17 1500  BP: (!) 151/73 (!) 151/73 (!) 168/84 (!) 171/79  Pulse: 84 84 90 87  Resp:   18 16  Temp:  98.4 F (36.9 C) 98.2 F (36.8 C) 98.2 F (36.8 C)  TempSrc:  Oral Oral Oral  SpO2:   100% 100%  Weight:      Height:        Inpatient medications: . amLODipine  10 mg Oral Daily  . buPROPion  37.5 mg Oral BID WC  . calcitRIOL  0.5 mcg Oral Daily  . carvedilol  25 mg Oral BID WC  . darbepoetin (ARANESP) injection - DIALYSIS  200 mcg Intravenous Q Wed-HD  . dolutegravir  50 mg Oral Daily  . ferric citrate  210 mg Oral TID WC  . [START ON 03/12/2017] hydrALAZINE  25 mg Oral TID  . montelukast  10 mg Oral QHS  . pravastatin  40 mg Oral QPM  . rilpivirine  25 mg Oral Q breakfast  . [START ON 03/15/2017] tenofovir  300 mg Oral Q Sun   . sodium chloride    . sodium chloride    . sodium chloride    . sodium chloride    . sodium chloride    . piperacillin-tazobactam (ZOSYN)  IV 3.375 g (03/11/17 0956)  . vancomycin     sodium chloride, sodium chloride, acetaminophen, albuterol, bisacodyl, febuxostat, HYDROcodone-acetaminophen, HYDROmorphone (DILAUDID) injection, lidocaine (PF), lidocaine-prilocaine, ondansetron **OR** ondansetron (ZOFRAN) IV, pentafluoroprop-tetrafluoroeth, senna-docusate  Exam: Alert, no distress No jvd Chest clear bilat RRR no mrg ABD soft ntnd no ascites Ext no LE edema RFA AVF+bruit  Dialysis: [place] MWF 4h 57min  F200  450/800   2/2.5 bath  111.5kg   RFA AVF   Hep 6K then 3K midrun -Hectorol 8 mcg IV TIW (last PTH 605 02/11/17) -Mircera 60 mcg IV q 2 weeks (Last dose 02/27/17 Last HGB 8.5 03/04/17) -Venofer 50 mg IV weekly (last dose 03/04/17 Fe 89 Tsat 35% 02/11/17)      Impression: 1.  Scrotal Abcess: I & D per urology 8/21. Vanc/Zosyn per primary 2.  Hyperkalemia:  resolved 3.  Anemia of CKD + ABL: Hb down today after prbc's yest and this am (2 total), suspect not active bleeding just haven't caught up from yesterday's bleeding episode. Plan give 2u prbc with HD today.  4. ESRD - MWF HD.  HD today 5.  Hypertension/ volume - rarely gets to OP EDW due to early sign-offs. Usually on amlodipine 10 mg PO q HS, hydralazine 10 mg PO TID  and Coreg 25 mg PO BID however OP med list totally different. Cardura 8 mg PO on OP med list. Hydralazine is at higher dose.   6.  Anemia  - HGB 7.5. Recheck HGB, may need to tranfuse. ESA dose due this week. Give Aranesp 200 mcg IV with HD tomorrow. Hold venofer until source of infection identified.  7.  Metabolic bone disease - Ca 10. Continue binders, decrease to 2.0 Ca bath. Cont VDRA. Check renal panel tomorrow.  8.  Nutrition - Renal diet with fld restrictions.  9.   HIV: continue meds per primary 10. H/O diastolic HF. Monitor volume. Continue coreg when BP stable.  11. AVF: AF down to 895 02/18/17 down from 1166 01/21/17. If patient has recurrent hyperkalemia will send to IR for fistulagram. Otherwise, refer to  [facility] as OP.   Plan - as above.  HD today, give 2u prbc's with HD today.     Kelly Splinter MD [facility] pager [phone]   03/11/2017, 3:17 PM    Recent Labs Lab 03/09/17 1125 03/10/17 0348 03/10/17 0839 03/10/17 1405 03/11/17 1130  NA 132* 132* 135  --  135  K 4.1 7.0* 3.9 4.3 3.8  CL 92* 94* 95*  --  92*  CO2 26 26  --   --  30  GLUCOSE 89 109* 135*  --  97  BUN 50* 59* 37*  --  43*  CREATININE 10.96* 13.05* 8.10*  --  10.37*  CALCIUM 9.7 10.0  --   --  9.3    Recent Labs Lab 03/09/17 1125  AST 22  ALT 17  ALKPHOS 140*  BILITOT 0.8  PROT 7.6  ALBUMIN 2.8*    Recent Labs Lab 03/09/17 1125 03/10/17 0348 03/10/17 0839 03/10/17 1405 03/11/17 1130  WBC 13.5* 18.5*  --  22.7* 14.8*  NEUTROABS 11.1*  --   --   --   --   HGB 8.3* 7.5* 7.5* 5.7* 5.8*  HCT 25.0* 23.5* 22.0*  17.7* 17.8*  MCV 85.9 87.0  --  86.8 84.4  PLT 230 219  --  225 197   Iron/TIBC/Ferritin/ %Sat    Component Value Date/Time   IRON 20 (L) 09/28/2014 1015   TIBC 243 09/28/2014 1015   FERRITIN 590 (H) 09/28/2014 1015   IRONPCTSAT 8 (L) 09/28/2014 1015

## 2017-03-11 NOTE — Discharge Summary (Signed)
Name: Darrell Johnson MRN: [id number] DOB: 02-03-1966 51 y.o. PCP: Jule Ser, DO  Date of Admission: 03/09/2017 11:41 AM Date of Discharge: 03/12/2017 Attending Physician: Oval Linsey, MD  Discharge Diagnosis: Active Problems:   Scrotal abscess  Discharge Medications: Allergies as of 03/12/2017   No Known Allergies     Medication List    STOP taking these medications   doxycycline 100 MG capsule Commonly known as:  VIBRAMYCIN   levofloxacin 500 MG tablet Commonly known as:  LEVAQUIN     TAKE these medications   acetaminophen 500 MG tablet Commonly known as:  TYLENOL Take 1,000 mg by mouth every 6 (six) hours as needed for mild pain.   albuterol (2.5 MG/3ML) 0.083% nebulizer solution Commonly known as:  PROVENTIL Take 3 mLs (2.5 mg total) by nebulization every 6 (six) hours as needed for wheezing or shortness of breath.   albuterol 108 (90 Base) MCG/ACT inhaler Commonly known as:  PROVENTIL HFA;VENTOLIN HFA Inhale 2 puffs into the lungs every 6 (six) hours as needed for wheezing or shortness of breath.   amLODipine 5 MG tablet Commonly known as:  NORVASC Take 10 mg by mouth daily.   buPROPion 75 MG tablet Commonly known as:  WELLBUTRIN TAKE 1/2 TABLET TWICE A DAY   calcitRIOL 0.5 MCG capsule Commonly known as:  ROCALTROL Take by mouth.   carvedilol 25 MG tablet Commonly known as:  COREG TAKE 1 TABLET (25 MG TOTAL) BY MOUTH 2 (TWO) TIMES DAILY WITH A MEAL. What changed:  how much to take  how to take this  when to take this  additional instructions   cefTAZidime-D5W 2 GM/50ML Solr Commonly known as:  FORTAZ Inject 50 mLs (2 g total) into the vein one time in dialysis.   doxazosin 8 MG tablet Commonly known as:  CARDURA TAKE 1 TABLET (8 MG TOTAL) BY MOUTH AT BEDTIME. What changed:  how much to take  how to take this  when to take this  additional instructions   EDURANT 25 MG Tabs tablet Generic drug:  rilpivirine TAKE 1  TABLET (25 MG TOTAL) BY MOUTH DAILY WITH BREAKFAST.   ferric citrate 1 GM 210 MG(Fe) tablet Commonly known as:  AURYXIA Take 420 mg by mouth 3 (three) times daily with meals.   furosemide 40 MG tablet Commonly known as:  LASIX Take 1 tablet (40 mg total) by mouth daily.   hydrALAZINE 25 MG tablet Commonly known as:  APRESOLINE Take 1 tablet (25 mg total) by mouth 3 (three) times daily.   HYDROcodone-acetaminophen 5-325 MG tablet Commonly known as:  NORCO Take 1 tablet by mouth every 6 (six) hours as needed for severe pain.   montelukast 10 MG tablet Commonly known as:  SINGULAIR Take 1 tablet (10 mg total) by mouth at bedtime.   pravastatin 40 MG tablet Commonly known as:  PRAVACHOL TAKE 1 TABLET (40 MG TOTAL) BY MOUTH EVERY EVENING.   TIVICAY 50 MG tablet Generic drug:  dolutegravir TAKE 1 TABLET (50 MG TOTAL) BY MOUTH DAILY.   ULORIC 80 MG Tabs Generic drug:  Febuxostat Take 80 mg by mouth daily as needed (gout).   vancomycin 1-5 GM/200ML-% Soln Commonly known as:  VANCOCIN Inject 200 mLs (1,000 mg total) into the vein one time in dialysis.   VIREAD 300 MG tablet Generic drug:  tenofovir TAKE 1 TABLET BY MOUTH ONCE A WEEK What changed:  See the new instructions.  Discharge Care Instructions        Start     Ordered   03/13/17 0000  vancomycin (VANCOCIN) 1-5 GM/200ML-% SOLN  Once in dialysis     03/12/17 1423   03/13/17 0000  cefTAZidime-D5W (FORTAZ) 2 GM/50ML SOLR  Once in dialysis     03/12/17 1423   03/12/17 0000  HYDROcodone-acetaminophen (NORCO) 5-325 MG tablet  Every 6 hours PRN     03/12/17 1423   03/12/17 0000  Increase activity slowly     03/12/17 1423   03/12/17 0000  Diet - low sodium heart healthy     03/12/17 1423   03/12/17 0000  Activity as tolerated - No restrictions     03/12/17 1423   03/12/17 0000  Call MD for:  temperature >100.4     03/12/17 1423   03/12/17 0000  Call MD for:  persistant nausea and vomiting      03/12/17 1423   03/12/17 0000  Call MD for:  severe uncontrolled pain     03/12/17 1423   03/12/17 0000  Discharge instructions    Comments:  Please follow up at your PCP's office on Thursday August 30 at 2:15pm regarding your hospitalization and your symptoms. Please contact the urologist regarding your follow up as well.   03/12/17 1423   03/11/17 0000  AMB Referral to [facility] Care Management    Comments:  Please assign to [place] and [facility] Pharmacist. Written consent obtained. Referral received from inpatient MD for care coordination. Also requested medication review for pharmacy. Please see detailed notes from [place] from 03/10/17. Currently at [facility] Thanks.Marthenia Rolling, [place], [facility] LEXNTZG-[phone]  Question Answer Comment  Reason for consult Please assign to [place] and [facility] Pharmacist   Expected date of contact 1-3 days (reserved for hospital discharges)      03/11/17 1110      Disposition and follow-up:   Mr.Jhonathan D Holtrop was discharged from [facility] in [place] condition.  At the hospital follow up visit please address:  1.  Mr. Hensley was admitted with severe scrotal pain, which had been progressively worsening over the two weeks prior to admission in spite of oral antibiotic therapy. The patient underwent I&D on admission and was discharged with vancomycin and ceftazidime to be given with dialysis on Friday 03/13/17. His culture results were pending on discharge and he will be called with oral antibiotic therapy by Saturday 03/14/17 to start taking after dialysis on Monday.   He was volume overloaded with SOB on admission 2/2 a short dialysis session the day of admission (patient could not tolerate dialysis with increased scrotal pain). He underwent dialysis on Tuesday (usually MWF schedule) as a result of his symptoms. Please assess patient's wound and evaluate for signs/symptoms of  infections.  2.  Labs / imaging needed at time of follow-up: None  3.  Pending labs/ test needing follow-up: None  Follow-up Appointments: Follow-up Information    Franchot Gallo, MD Follow up.   Specialty:  Urology Why:  we will call you to schedule followup Contact information: [place] [address] [phone]        Jule Ser, DO. Go to.   Specialty:  Internal Medicine Why:  Please follow up with PCP on Thursday 8/30 at 2:15pm.  Contact information: [address] [place] [id number] [place] Hospital Course by problem  list: Active Problems:   Scrotal abscess   Mr. Missey is a 51 yo M with history of CHF (EF 55-60% on 09/28/14), ESRD on HD M/W/F, chronic anemai, and well controlled HIV (CD4 = 600, viral load = 28 on 09/2016) who presented to the ED with a 2 week history of worsening scrotal pain that failed outpatient management with doxycycline and levaquin. On admission he was taken to the OR for I&D procedure with urology. The patient tolerated the procedure well and was admitted to the teaching service with urology and nephrology consulting. The specific problems addressed during admission are as follows:  Scrotal Abscess s/p I&D on 03/09/17: The patient's scrotal pain improved after I&D with urology. The patient experienced significant post-operative bleeding and his Hgb dropped from 7.5 to 5.7 on HD#1. His bleeding stabilized with a proper pressure dressing. During his hospitalization he received 4 total units for symptomatic hypotension. Once a proper wound dressing regimen was started, the patient remained hemodynamically stable. A gram stain obtained during I&D procedure showed prominent gram + cocci and moderate gram - rods. Has given broad spectrum antibiotics (vancomycin, zosyn) prior to ID, which was continued until discharge. He was given on order for IV vancomycin and ceftazidime to be given during dialysis on 03/13/17  after discharge. His culture results and sensitivities will be released from microbiology lab on 03/14/17, at which time he will be sent a new prescription for more narrowed antibiotic therapy. He will receive a total of 10 total days of antibiotic therapy, so his last dose is planned for 03/19/17.  ESRD with HD YTK:[id number] underwent dialysis during hospitalization. He had an elevated K on admission, which normalized with dialysis on HD#1. He underwent dialysis again on HD#2 to get him back on his normal MWF schedule. He was discharged with instructions to go to dialysis as usual after leaving the hospital.   Anemia of CKD: Per chart review patient's baseline Hgb between 8-10. Patient required 2 units of blood for post-operative bleeding and acute drop Hgb with symptomatic hypotension after dialysis. The patient's Hgb returned to baseline after transfusion and his home ferric citrate daily was continued during hospitalization.  HTN with HFpEF (echo 09/28/14 with EF 55-60%):His BP was elevated on admission 2/2 volume overload after shortened dialysis session and missed medication doses prior to admission. Patient's home amlodipine 10mg , hydralazine 25mg  TID, doxazosin 8mg  qD, carvedilol 25mg  BID, and furosemide 40 mg daily were held during hospitalization 2/2 symptomatic hypotension as a result of continued post-operative bleeding. His BP normalized on discharge and was told to restart all home BP medications at discharge.  LEX:NTZG problem is well controlled at baseline, with his last CD4 = 600 and Viral Load = 28 on 10/01/16. He was continued on home regimen of rilpivirine 25 mg aD, dolutegravir 50 mg qD, and tenofovir 300mg  qSunday while inpatient.  Discharge Vitals:   BP (!) 173/81 (BP Location: Left Arm)   Pulse 93   Temp 98.2 F (36.8 C) (Oral)   Resp 18   Ht 5\' 9"  (1.753 m)   Wt 251 lb 1.7 oz (113.9 kg)   SpO2 95%   BMI 37.08 kg/m   Pertinent Labs, Studies, and Procedures:   BMP BMP  Latest Ref Rng & Units 03/12/2017 03/11/2017 03/10/2017  Glucose 65 - 99 mg/dL 104(H) 97 -  BUN 6 - 20 mg/dL 24(H) 43(H) -  Creatinine 0.61 - 1.24 mg/dL 7.76(H) 10.37(H) -  Sodium 135 - 145 mmol/L 133(L) 135 -  Potassium 3.5 -  5.1 mmol/L 3.6 3.8 4.3  Chloride 101 - 111 mmol/L 94(L) 92(L) -  CO2 22 - 32 mmol/L 28 30 -  Calcium 8.9 - 10.3 mg/dL 9.0 9.3 -   CBC CBC Latest Ref Rng & Units 03/12/2017 03/11/2017 03/10/2017  WBC 4.0 - 10.5 K/uL 15.9(H) 14.8(H) 22.7(H)  Hemoglobin 13.0 - 17.0 g/dL 7.6(L) 5.8(LL) 5.7(LL)  Hematocrit 39.0 - 52.0 % 23.1(L) 17.8(L) 17.7(L)  Platelets 150 - 400 K/uL 202 197 225   CT Abdomen and Pelvis: IMPRESSION: 1. Fluid collection centered at the right penile base. Given the clinical history and adjacent scrotal and subcutaneous edema, favored to represent an abscess. 2. Pelvic adenopathy, likely reactive. Consider CT followup at 3 months to confirm resolution. 3.  Aortic Atherosclerosis (ICD10-I70.0). 4. Small bilateral hydroceles with diffuse scrotal edema. 5. Left femoral head avascular necrosis.  Electronically Signed   By: Abigail Miyamoto M.D.   On: 03/09/2017 13:46  Chest X Ray: FINDINGS: Unchanged cardiomegaly with bilateral interstitial opacities. No pneumothorax. Suspected small right pleural effusion.  IMPRESSION: Cardiomegaly and mild interstitial edema with suspected small right pleural effusion.  Electronically Signed   By: Ulyses Jarred M.D.   On: 03/09/2017 18:30  Microbiology Results:  Procedure Component Value Units Date/Time  Aerobic/Anaerobic Culture (surgical/deep wound) [[id number]] Collected: 03/09/17 2147  Order Status: Completed Specimen: Tissue from Other Updated: 03/12/17 1408   Specimen Description ABSCESS   Special Requests RIGHT SCROTAL   Gram Stain --   ABUNDANT WBC PRESENT,BOTH PMN AND MONONUCLEAR  ABUNDANT GRAM POSITIVE COCCI  MODERATE GRAM NEGATIVE RODS    Culture --   RARE STAPHYLOCOCCUS HAEMOLYTICUS    SUSCEPTIBILITIES TO FOLLOW    Report Status PENDING   Discharge Instructions: Discharge Instructions    AMB Referral to [place] Management    Complete by:  As directed    Please assign to [place] and [facility] Pharmacist. Written consent obtained. Referral received from inpatient MD for care coordination. Also requested medication review for pharmacy. Please see detailed notes from [place] from 03/10/17. Currently at [facility]. Thanks.Marthenia Rolling, [place], [facility] [phone]   Reason for consult:  Please assign to [place] and [facility] Pharmacist   Expected date of contact:  1-3 days (reserved for hospital discharges)   Activity as tolerated - No restrictions    Complete by:  As directed    Call MD for:  persistant nausea and vomiting    Complete by:  As directed    Call MD for:  severe uncontrolled pain    Complete by:  As directed    Call MD for:  temperature >100.4    Complete by:  As directed    Diet - low sodium heart healthy    Complete by:  As directed    Discharge instructions    Complete by:  As directed    Please follow up at your PCP's office on Thursday August 30 at 2:15pm regarding your hospitalization and your symptoms. Please contact the urologist regarding your follow up as well.   Increase activity slowly    Complete by:  As directed       Signed: Thomasene Ripple, MD 03/12/2017, 2:54 PM   Pager: [phone]

## 2017-03-11 NOTE — Progress Notes (Signed)
Subjective:  [name] felt better this morning and states that his dizziness experienced yesterday has improved. He also stated that his pain in his incision site is improved from yesterday. He did not have any increased bleeding overnight.   Objective:  Vital signs in last 24 hours: Vitals:   03/11/17 0433 03/11/17 0548 03/11/17 0622 03/11/17 0935  BP: (!) 120/46 (!) 136/50 133/61 (!) 141/68  Pulse: 93 87 94 89  Resp: 20 16 16 17   Temp: 98.7 F (37.1 C) 99.1 F (37.3 C) 99.3 F (37.4 C) 98.8 F (37.1 C)  TempSrc: Oral Oral Oral Oral  SpO2: 100% 100% 100% 100%  Weight:      Height:       Physical Exam  Constitutional: He appears well-developed and well-nourished. No distress.  Cardiovascular:  Regular rate and rhythm. Holosystolic murmur heard best at left upper sternal border.  Pulmonary/Chest: Effort normal. No respiratory distress.  No crackles appreciated  Abdominal: Soft. Bowel sounds are normal. He exhibits no distension. There is no tenderness.  Genitourinary:  Genitourinary Comments: Patient's dressing is clean, dry, and intact without significant dried blood. NO clotting or oozing. Gauze packed into wound cavity. No erythema or pain around incision.  Musculoskeletal: He exhibits no edema (of lower extremities) or tenderness (of lower extremities).  Skin: Skin is warm and dry. Capillary refill takes less than 2 seconds. No erythema.   Assessment/Plan:  Active Problems:   Scrotal abscess  [name] is a 51 yo M with history of CHF (EF 55-60% on 09/28/14), ESRD on HD M/W/F, chronic anemai, and well controlled HIV (CD4 = 600, viral load = 28 on 09/2016) who presented to the ED with a 2 week history of worsening scrotal pain that failed outpatient management with doxycycline and levaquin. On admission he was taken to the OR for I&D procedure with urology. The patient tolerated the procedure well and was admitted to the teaching service with urology and nephrology  consulting. The specific problems addressed during admission are as follows:  Scrotal Abscess s/p I&D on 03/09/17: The patient's scrotal pain is much improved from yesterday. Gram stain obtained during I&D procedure showed prominent gram + cocci and moderate gram - rods. Culture still pending. He will continue broad spectrum antibiotics until culture results allow for narrowing of therapy. His WBC continues to rise on antibiotic therapy (up to 22.7 from 18.5 today) and patient endorsed subjective fevers this AM, will continue to monitor symptoms and antibiotic therapy. The patient reported significant oozing from I&D site yesterday with symptomatic hypotension. He required a transfusion of 2 units of blood for drop in Hgb from 7.5 to 5.7 overnight. The patient's wound appears to have stopped bleeding after pressure dressing yesterday. Urology visited him yesterday and left wound care recommendations.  -Urology following, appreciate recommendations regarding wound care as outpatient and follow up plans -Continue Vancomycin, zosyn for broad spectrum coverage -Follow up surgical cultures -Norco, 5-325 mg for pain management q 6 hours -CBC in AM to assess WBC  ESRD with HD MWF: Patient underwent dialysis yesterday and his BMP improved: K = 3.9 from 7.0, Cr = 8.10 from 13.05, and BUN = 37 from 59. Plans to undergo dialysis today.  -Continue home calcitrol daily  Anemia of CKD: Per chart review patient's baseline Hgb between 8-10. Patient required 2 units of blood for post-operative bleeding and acute drop Hgb with symptomatic hypotension after dialysis.  -Post transfusion H/H = pending -Continue home ferric citrate daily  HTN with  HFpEF (echo 09/28/14 with EF 55-60%):BP high on admission 2/2 volume overload and missed medication doses. Patient's home amlodipine 10mg , hydralazine 25mg  TID, doxazosin 8mg  qD, carvedilol 25mg  BID, and furosemide 40 mg daily were held yesterday 2/2 hypotension and post-op  bleeding.  -Plan to restart home BP medications today now that BP has normalized. Will start with carvedilol 25 mg BID. Will add amlodipine, hydralazine, and doxazosin back after dialysis.   HIV: This problem is well controlled at baseline, with his last CD4 = 600 and Viral Load = 28 on 10/01/16. -Continue rilpivirine 25 mg aD, dolutegravir 50 mg qD, tenofovir 300mg  qSunday  Gout: No current signs or symptoms of gout flare. -Continue home fuboxostat 80 mg PRN  FEN/GI:  -Advance as tolerated after dialysis  DVT Prophylaxis: -SCDs while in bed, ambulate when possible   Dispo: Anticipated discharge in approximately 1-2 day(s).   Thomasene Ripple, MD 03/11/2017, 10:27 AM Pager: [phone]

## 2017-03-11 NOTE — Progress Notes (Signed)
Internal Medicine Attending  Date: 03/11/2017  Patient name: Darrell Johnson Medical record number: [id number] Date of birth: 04-May-1966 Age: 51 y.o. Gender: male  I saw and evaluated the patient. I reviewed the resident's note by Dr. Berneice Gandy and I agree with the resident's findings and plans as documented in her progress note.  When seen on rounds this morning [name] was much more comfortable today. The oozing from the surgical wound was improved with the pressure dressing. He also denied any dizziness or significant wound tenderness. Examination of the dressing over the surgical wound revealed some serosanguineous drainage soaking the gauze, but there was no surrounding clots. The repeat hemoglobin after the transfusion is pending at this time. The wound culture that was obtained intraoperatively was reintubated for better growth. We will continue the broad-spectrum antibiotics as well as the wound care. I anticipate he will be stable for discharge in the next 24-48 hours.

## 2017-03-11 NOTE — Progress Notes (Signed)
Pharmacy Antibiotic Note  Darrell Johnson is a 51 y.o. male admitted on 03/09/2017 with a scrotal abscess.  Pharmacy has been consulted for Vancomycin dosing.  Plan: Vancomycin 1g IV qHD- MWF Zosyn 3.375g IV q12h EI Follow clinical progression, c/s, HD schedule/tolerance, pre-HD vancomycin level PRN  Height: 5\' 9"  (175.3 cm) Weight: 246 lb 11.1 oz (111.9 kg) (standing.) IBW/kg (Calculated) : 70.7  Temp (24hrs), Avg:98.8 F (37.1 C), Min:98.1 F (36.7 C), Max:99.3 F (37.4 C)   Recent Labs Lab 03/09/17 1125 03/09/17 1200 03/09/17 1521 03/10/17 0348 03/10/17 0839 03/10/17 1405 03/11/17 1130  WBC 13.5*  --   --  18.5*  --  22.7* 14.8*  CREATININE 10.96*  --   --  13.05* 8.10*  --   --   LATICACIDVEN  --  0.98 0.62  --   --   --   --     Estimated Creatinine Clearance: 13.3 mL/min (A) (by C-G formula based on SCr of 8.1 mg/dL (H)).    No Known Allergies  Antimicrobials this admission: 8/20 Vanc >>  8/20 Zosyn >>   Dose adjustments this admission: None  Microbiology results: 8/20 abscess: GPC and GNR > reincubated  Thank you for allowing pharmacy to be a part of this patient's care.  Pauleen Goleman D. Brinlyn Cena, PharmD, BCPS Clinical Pharmacist Pager: [phone] Clinical Phone for 03/11/2017 until 3:30pm: [phone] If after 3:30pm, please call main pharmacy at [phone] 03/11/2017 1:13 PM

## 2017-03-12 ENCOUNTER — Encounter: Payer: Self-pay | Admitting: Internal Medicine

## 2017-03-12 ENCOUNTER — Ambulatory Visit: Payer: Self-pay

## 2017-03-12 ENCOUNTER — Telehealth: Payer: Self-pay

## 2017-03-12 LAB — CBC
HEMATOCRIT: 23.1 % — AB (ref 39.0–52.0)
Hemoglobin: 7.6 g/dL — ABNORMAL LOW (ref 13.0–17.0)
MCH: 28 pg (ref 26.0–34.0)
MCHC: 32.9 g/dL (ref 30.0–36.0)
MCV: 85.2 fL (ref 78.0–100.0)
PLATELETS: 202 10*3/uL (ref 150–400)
RBC: 2.71 MIL/uL — ABNORMAL LOW (ref 4.22–5.81)
RDW: 17.7 % — AB (ref 11.5–15.5)
WBC: 15.9 10*3/uL — AB (ref 4.0–10.5)

## 2017-03-12 LAB — TYPE AND SCREEN
ABO/RH(D): A POS
ANTIBODY SCREEN: NEGATIVE
UNIT DIVISION: 0
UNIT DIVISION: 0
UNIT DIVISION: 0
UNIT DIVISION: 0

## 2017-03-12 LAB — BPAM RBC
BLOOD PRODUCT EXPIRATION DATE: 201808292359
BLOOD PRODUCT EXPIRATION DATE: 201809062359
Blood Product Expiration Date: 201809142359
Blood Product Expiration Date: 201809162359
ISSUE DATE / TIME: 201808211635
ISSUE DATE / TIME: 201808221412
ISSUE DATE / TIME: 201808220533
ISSUE DATE / TIME: 201808221412
UNIT TYPE AND RH: 6200
UNIT TYPE AND RH: 6200
Unit Type and Rh: 6200
Unit Type and Rh: 6200

## 2017-03-12 LAB — RENAL FUNCTION PANEL
ANION GAP: 11 (ref 5–15)
Albumin: 2.4 g/dL — ABNORMAL LOW (ref 3.5–5.0)
BUN: 24 mg/dL — AB (ref 6–20)
CO2: 28 mmol/L (ref 22–32)
CREATININE: 7.76 mg/dL — AB (ref 0.61–1.24)
Calcium: 9 mg/dL (ref 8.9–10.3)
Chloride: 94 mmol/L — ABNORMAL LOW (ref 101–111)
GFR, EST AFRICAN AMERICAN: 8 mL/min — AB (ref >60)
GFR, EST NON AFRICAN AMERICAN: 7 mL/min — AB (ref >60)
GLUCOSE: 104 mg/dL — AB (ref 65–99)
PHOSPHORUS: 6.7 mg/dL — AB (ref 2.5–4.6)
POTASSIUM: 3.6 mmol/L (ref 3.5–5.1)
Sodium: 133 mmol/L — ABNORMAL LOW (ref 135–145)

## 2017-03-12 MED ORDER — DOLUTEGRAVIR SODIUM 50 MG PO TABS: 50.0000 mg | ORAL_TABLET | Freq: Every day | ORAL | Status: DC

## 2017-03-12 MED ORDER — DOXAZOSIN MESYLATE 8 MG PO TABS
8.0000 mg | ORAL_TABLET | Freq: Every day | ORAL | Status: DC
  Administered 2017-03-12: 8 mg via ORAL
  Filled 2017-03-12: qty 1

## 2017-03-12 MED ORDER — VANCOMYCIN HCL IN DEXTROSE 1-5 GM/200ML-% IV SOLN: 1000.0000 mg | Freq: Once | INTRAVENOUS | 0 refills | Status: AC

## 2017-03-12 MED ORDER — CEFTAZIDIME AND DEXTROSE 2 GM/50ML IV SOLR: 2.0000 g | Freq: Once | INTRAVENOUS | 0 refills | Status: AC

## 2017-03-12 MED ORDER — HYDROCODONE-ACETAMINOPHEN 5-325 MG PO TABS: 1.0000 | ORAL_TABLET | Freq: Four times a day (QID) | ORAL | 0 refills | Status: AC | PRN

## 2017-03-12 NOTE — Progress Notes (Signed)
I spoke with Dr. Jonnie Finner regarding order for shuntogram.  The patient is likely being discharged today per the primary service.  Dr. Jonnie Finner is ok with this and will likely plan for the procedure at their outpatient facility.  He will contact us if plans change.  Hiro Vipond E 10:10 AM 03/12/2017

## 2017-03-12 NOTE — Telephone Encounter (Signed)
Santiago Glad with [facility] requesting to speak with a nurse to get a clarification on meds.

## 2017-03-12 NOTE — Progress Notes (Signed)
Internal Medicine Attending  Date: 03/12/2017  Patient name: Darrell Johnson Medical record number: [id number] Date of birth: 07-01-1966 Age: 51 y.o. Gender: male  I saw and evaluated the patient. I reviewed the resident's note by Dr. Berneice Gandy and I agree with the resident's findings and plans as documented in her progress note.  Darrell Johnson wounds look much improved today with much less bleeding and drainage. He also felt well with no new complaints and improvement in his groin pain near the incision. He was felt stable for discharge home and was taught appropriate wound care. At this point his surgical culture has grown Staph hemolyticus, but is being held for possible anaerobic growth. The Gram stain I think is more reflective of what was likely part of a polymicrobial abscess. The reason for the poor growth is related to the preoperative antibiotics that he had received. He is to receive vancomycin and Zosyn during his hemodialysis session tomorrow and we will have a final recommendation for a broad-spectrum oral antibiotic once we have the final results from the culture, realizing that we will also have to cover for gram-negative rod coliforms.

## 2017-03-12 NOTE — Telephone Encounter (Signed)
Ph[facility] and ask about [facility] for [facility] abx, I cannot see his med list at the moment, was this correct? Because [facility] cannot [facility], you may call them if advisement is needed

## 2017-03-12 NOTE — Consult Note (Signed)
[place] Nurse wound consult note Reason for Consult: scrotal abscess, teach patient to change dressing Wound type: surgical I&D Pressure Injury POA:NA Measurement:4cm x 1cm x 1.5cm  Wound LKT:GYBWL, pink, moist Drainage (amount, consistency, odor) moderate, serosanguinous. No active bleeding with dressing change today Periwound: intact with edema Dressing procedure/placement/frequency: Explained removal of the packing and that he may need to have his wife hold a mirror, because the location and patients body habitus does not allow for him to be able to visualize the site. Explained rationale for leaving a wick out of the wound to allow for drainage to the topper dressing. Demonstrated packing site with 1" conform saline soaked, and the importance of getting packing down into the base to avoid a re-collection of fluid.  He verbalized understanding.  He will most likely need assistance, and reports his "friend did some nursing care".  I have explained that using a male feminine hygiene pad would be more cost effective as a topper rather than ABD pad and would work just as well. Using mesh underwear to hold in place. Verbalized understanding.   Discussed POC with patient and bedside nurse.  Re consult if needed, will not follow at this time. Thanks  Houston Zapien [employer], RN,CWOCN, CNS, [place] [phone])

## 2017-03-12 NOTE — Progress Notes (Signed)
Subjective:  Darrell Johnson was seen laying comfortably in bed this morning and he had no acute complaints. He states that he feels well this AM and hasn't had any more episodes of heavy bleeding. He endorses some pain at surgical site, but this is improving and well controlled with oral medications. He denies chest pain, shortness of breath, abdominal pain, and headaches.  Objective:  Vital signs in last 24 hours: Vitals:   03/11/17 1535 03/11/17 1540 03/11/17 2128 03/12/17 0531  BP: (!) 174/76 (!) 164/77 (!) 157/67 (!) 173/81  Pulse: 88 93 81 93  Resp: 18 18 18 18   Temp: 98.3 F (36.8 C) 98.1 F (36.7 C) 98.7 F (37.1 C) 98.2 F (36.8 C)  TempSrc: Oral Oral Oral Oral  SpO2: 100% 100% 100% 95%  Weight:  251 lb 1.7 oz (113.9 kg)    Height:       Physical Exam  Constitutional: He is well-developed, well-nourished, and in no distress. No distress.  Cardiovascular: Normal rate, regular rhythm and intact distal pulses.  Exam reveals no gallop and no friction rub.   Murmur (holosystolic murmur heard best at LUSB, interval decrease in intensity from yesterday's exam) heard. Pulmonary/Chest: Effort normal and breath sounds normal. No respiratory distress. He has no wheezes.  No crackles appreciated.  Abdominal: Soft. Bowel sounds are normal. He exhibits no distension. There is no tenderness. There is no guarding.  Genitourinary: Penis normal.  Genitourinary Comments: Patient had mesh underwear in place. Gauze overlying incision site clean, dry and intact. Packing gauze with dried blood. NO oozing from the surgical site. No tenderness to palpation, swelling, or erythema around site.  Musculoskeletal: He exhibits no edema (of bilateral lower extermities) or tenderness (of bilateral lower extremities).  Skin: Skin is warm and dry. No rash noted. No erythema.   Assessment/Plan:  Active Problems:   Scrotal abscess  Darrell Johnson is a 51 yo M with history of CHF (EF 55-60% on 09/28/14), ESRD on  HD M/W/F, chronic anemai, and well controlled HIV (CD4 = 600, viral load = 28 on 09/2016) who presented to the ED with a 2 week history of worsening scrotal pain that failed outpatient management with doxycycline and levaquin. On admission he was taken to the OR for I&D procedure with urology. The patient tolerated the procedure well and was admitted to the internal medicine teaching service with urology and nephrology consulting. The specific problems addressed during admission are as follows:  Scrotal Abscess s/p I&D on 03/09/17: The patient's scrotal pain is much improved from yesterday. No oozing observed and dressing is clean, dry, and intact. Wound culture results still pending. He will continue broad spectrum antibiotics until culture results allow for narrowing of therapy. Patient's WBC improving and he has remained afebrile. The patient received 4 units of blood total yesterday. His Hgb is now around baseline, he remains hemodynamically stable, and is no longer actively bleeding from surgical site.  -Urology will follow up as an outpatient; wound care consult for home wound care -Continue Vancomycin, zosyn for broad spectrum coverage with dialysis on Friday. The patient will be contacted as an outpatient regarding wound culture results and narrowing of antibiotic therapy. -Follow up surgical cultures and call patient with results. -Norco, 5-325 mg for pain management q 6 hours  ESRD with HD OAC:ZYSAYTK underwent dialysis yesterday and feels better this morning. Plans for dialysis as an outpatient tomorrow as usually scheduled. -Continue home calcitrol daily  Anemia of CKD: Per chart review patient's baseline Hgb  between 8-10.  -Post transfusion of 4 units Hgb = 7.6 on 8/22 -Continue home ferric citrate daily  HTN with HFpEF (echo 09/28/14 with EF 55-60%):BP high on admission 2/2 volume overload and missed medication doses. Patient's home amlodipine 10mg , hydralazine 25mg  TID, doxazosin  8mg  qD, carvedilol 25mg  BID, and furosemide 40 mg daily were held early in hospitalization as a result of symptomatic hypotension 2/2 post-operative blood loss. Yesterday carvedilol 25 mg BID and amlodipine 10 mg were restarted. BP now trending upward.  -Plan to discharge patient on home BP regimen.   FBP:ZWCH problem is well controlled at baseline, with his last CD4 = 600 and Viral Load = 28 on 10/01/16. -Continue rilpivirine 25 mg aD, dolutegravir 50 mg qD, tenofovir 300mg  qSunday  Gout:No current signs or symptoms of gout flare. -Continue home fuboxostat 80 mg PRN  FEN/GI:  -Advance as tolerated after dialysis  DVT Prophylaxis: -SCDs while in bed, ambulate when possible    Dispo: Anticipated discharge in approximately 1 day.   Darrell Ripple, MD 03/12/2017, 10:49 AM Pager: [phone]

## 2017-03-12 NOTE — Progress Notes (Signed)
Pt discharge education and instructions completed with pt and friend at bedside; both voices understanding and denies any questions. Pt IV removed; wound dsg remain clean, dry and intact; pt discharge home with friend to transport him home. Pt handed his prescription for Vicodin and pt to pick up electronically sent prescription from preferred pharmacy on file. Pt transported off unit via wheelchair with belongings and friend to the side. Francis Gaines Hazeline Charnley RN.

## 2017-03-13 ENCOUNTER — Telehealth: Payer: Self-pay | Admitting: Internal Medicine

## 2017-03-13 ENCOUNTER — Other Ambulatory Visit: Payer: Self-pay

## 2017-03-13 DIAGNOSIS — N492 Inflammatory disorders of scrotum: Secondary | ICD-10-CM | POA: Diagnosis not present

## 2017-03-13 DIAGNOSIS — N2581 Secondary hyperparathyroidism of renal origin: Secondary | ICD-10-CM | POA: Diagnosis not present

## 2017-03-13 DIAGNOSIS — N186 End stage renal disease: Secondary | ICD-10-CM | POA: Diagnosis not present

## 2017-03-13 DIAGNOSIS — D631 Anemia in chronic kidney disease: Secondary | ICD-10-CM | POA: Diagnosis not present

## 2017-03-13 DIAGNOSIS — E8779 Other fluid overload: Secondary | ICD-10-CM | POA: Diagnosis not present

## 2017-03-13 MED ORDER — SULFAMETHOXAZOLE-TRIMETHOPRIM 400-80 MG PO TABS: 1.0000 | ORAL_TABLET | Freq: Every day | ORAL | 0 refills | Status: AC

## 2017-03-13 NOTE — Telephone Encounter (Signed)
This was an order for IV antibiotics to be given with dialysis today at dialysis center. I was working with the NP on the dialysis unit to make sure he will get antibiotics with dialysis today. I believe the center has them. [facility] probably [facility]'t [facility]. Sorry about that. Will contact NP that I was coordinating with to make sure everything is ok.

## 2017-03-13 NOTE — Telephone Encounter (Signed)
Spoke to patient on phone this afternoon after he finished dialysis. He states that he received antibiotics with dialysis today and had no difficulty tolerating them.   I informed him that his culture results came back from his procedure that was done in the hospital. I informed him that he will have to start taking Bactrim after dialysis on Monday. He was instructed to take 1 pill every day (after dialysis on dialysis days) with the last dose on August 30. This will give him 10 days of antibiotic therapy for treatment of his scrotal abscess.  The [facility] He expressed understanding of the plan and knows the importance of taking antibiotics and following up with his primary care physician as planned on Thursday August 30.

## 2017-03-13 NOTE — Patient Outreach (Signed)
[facility] [facility]) Care Management  03/13/17  KENITH TRICKEL February 06, 1966 [id number]  RNCM received referral on 03/11/2017 from hospital liaison to initiate transition of care. Patient was discharged to home on 03/12/2017 following an inpatient admission 8/20-8/23 for a scrotal abscess. Patient underwent I&D on admission and discharged with IV vancomycin and ceftazidime to be given with dialysis on 03/13/2017. Culture results pending on dc and patient to be called by Saturday with orders for oral antibioic therapy to start taking after dialysis on Monday.  Patient has a past medical history of CHF (ED 55-60% on 09/28/14), ESRD on hemodialysis (Mondays, Wednesdays and Fridays), chronic anemia, well controlled HIV.  10:19 am  RNCM briefly spoke with patient who was at dialysis. Appointment rescheduled for later in the day after 5 pm to give him time to get home after dialysis.  5:15 pm Second attempt to reach patient today was unsuccessful. Message stated that the wireless customer is not available and to try again later. RNCM unable to leave a voicemail. Will attempt again next week for transition of care.  [place] R. Rashee Marschall, RN, BSN, [place] Management Coordinator [phone]

## 2017-03-16 ENCOUNTER — Other Ambulatory Visit: Payer: Self-pay

## 2017-03-16 ENCOUNTER — Other Ambulatory Visit: Payer: Self-pay | Admitting: Pharmacist

## 2017-03-16 DIAGNOSIS — D631 Anemia in chronic kidney disease: Secondary | ICD-10-CM | POA: Diagnosis not present

## 2017-03-16 DIAGNOSIS — N492 Inflammatory disorders of scrotum: Secondary | ICD-10-CM | POA: Diagnosis not present

## 2017-03-16 DIAGNOSIS — N2581 Secondary hyperparathyroidism of renal origin: Secondary | ICD-10-CM | POA: Diagnosis not present

## 2017-03-16 DIAGNOSIS — E8779 Other fluid overload: Secondary | ICD-10-CM | POA: Diagnosis not present

## 2017-03-16 DIAGNOSIS — N186 End stage renal disease: Secondary | ICD-10-CM | POA: Diagnosis not present

## 2017-03-16 LAB — AEROBIC/ANAEROBIC CULTURE (SURGICAL/DEEP WOUND)

## 2017-03-16 LAB — AEROBIC/ANAEROBIC CULTURE W GRAM STAIN (SURGICAL/DEEP WOUND)

## 2017-03-16 NOTE — Patient Outreach (Signed)
[place] [facility]) Care Management  03/16/2017  Darrell Johnson Feb 19, 1966 [id number]   Patient was called per inpatient referral for medication review post discharge. Unfortunately, patient did not answer the phone. Pharmacist unable to leave a message. Once phone rang >10 times a recording stated "the number you called is unavailable".  Plan:  Call patient back within 1-3 business days.   Elayne Guerin, PharmD, [place] Clinical Pharmacist [phone]

## 2017-03-16 NOTE — Patient Outreach (Signed)
[place] [facility]) Care Management   03/16/17  CHRISTEN BEDOYA [birth year] [id number]  Second attempt to reach patient for transition of care without success. Phone rang multiple times with no answer and no option to leave a voicemail.  [place] R. Roneka Gilpin, RN, BSN, [place] Management Coordinator [phone]

## 2017-03-17 ENCOUNTER — Other Ambulatory Visit: Payer: Self-pay

## 2017-03-17 ENCOUNTER — Other Ambulatory Visit: Payer: Self-pay | Admitting: Pharmacist

## 2017-03-17 NOTE — Patient Outreach (Signed)
[place] [facility]) Care Management  03/17/17  Darrell Johnson 08-08-1965 [id number]  Third and final outreach attempt completed without success.  Message stated that the wireless customer is not available and to try again later. RNCM unable to leave a voicemail.  Plan- RNCM will send outreach barrier letter and wait 10 business days before completing case closure if no return call has been received.  [place] R. Ilayda Toda, RN, BSN, [place] Management Coordinator [phone]

## 2017-03-17 NOTE — Patient Outreach (Signed)
[place] [facility]) Care Management  03/17/2017  KAEMON BARNETT 11/26/1965 [id number]   Called patient regarding medication review post discharge. HIPAA identifiers were obtained. Patient said he was in the car and could not review his medications with me at the time. He requested I call him back around 4:40pm.   Plan:   Cal the patient back around 4:30pm (he has a scheduled call with [place] today at 4pm). If I am unable to reach the patient, I will call him back within 1-2 business days.   Elayne Guerin, PharmD, [place] Clinical Pharmacist [phone]

## 2017-03-18 DIAGNOSIS — N186 End stage renal disease: Secondary | ICD-10-CM | POA: Diagnosis not present

## 2017-03-18 DIAGNOSIS — E8779 Other fluid overload: Secondary | ICD-10-CM | POA: Diagnosis not present

## 2017-03-18 DIAGNOSIS — D631 Anemia in chronic kidney disease: Secondary | ICD-10-CM | POA: Diagnosis not present

## 2017-03-18 DIAGNOSIS — N492 Inflammatory disorders of scrotum: Secondary | ICD-10-CM | POA: Diagnosis not present

## 2017-03-18 DIAGNOSIS — N2581 Secondary hyperparathyroidism of renal origin: Secondary | ICD-10-CM | POA: Diagnosis not present

## 2017-03-19 ENCOUNTER — Encounter: Payer: Self-pay | Admitting: Internal Medicine

## 2017-03-19 ENCOUNTER — Ambulatory Visit: Payer: Self-pay

## 2017-03-19 ENCOUNTER — Ambulatory Visit (INDEPENDENT_AMBULATORY_CARE_PROVIDER_SITE_OTHER): Payer: Medicare Other | Admitting: Internal Medicine

## 2017-03-19 VITALS — BP 181/92 | HR 83 | Temp 98.3°F | Ht 69.0 in | Wt 255.3 lb

## 2017-03-19 DIAGNOSIS — B957 Other staphylococcus as the cause of diseases classified elsewhere: Secondary | ICD-10-CM

## 2017-03-19 DIAGNOSIS — Z5189 Encounter for other specified aftercare: Secondary | ICD-10-CM | POA: Diagnosis not present

## 2017-03-19 DIAGNOSIS — N492 Inflammatory disorders of scrotum: Secondary | ICD-10-CM | POA: Diagnosis not present

## 2017-03-19 DIAGNOSIS — F17211 Nicotine dependence, cigarettes, in remission: Secondary | ICD-10-CM | POA: Diagnosis not present

## 2017-03-19 DIAGNOSIS — N499 Inflammatory disorder of unspecified male genital organ: Secondary | ICD-10-CM | POA: Diagnosis not present

## 2017-03-19 MED ORDER — HYDROCODONE-ACETAMINOPHEN 5-325 MG PO TABS: 1.0000 | ORAL_TABLET | Freq: Four times a day (QID) | ORAL | 0 refills | Status: DC | PRN

## 2017-03-19 NOTE — Patient Instructions (Signed)
It was nice to see you again, Darrell Johnson.   Continue taking Bactrim as prescribed.  We gave a prescription for Norco 5-325mg . Please take 1 tablet every 6 hours as needed for pain.   Call internal medicine clinic if you have further questions.

## 2017-03-19 NOTE — Progress Notes (Signed)
   CC: Scrotal abscess hospital follow-up  HPI:  Darrell Johnson is a 51 y.o. male with PMH listed below who presents to clinic for scrotal abscess hospital follow up. Please see problem based assessment and plan for further details.   Past Medical History:  Diagnosis Date  . Anemia   . Arthritis   . CHF (congestive heart failure) ([place])   . Chronic kidney disease    stage 3-4 CKD, followed by Dr. Moshe Cipro  . ESRD (end stage renal disease) ([place])    ESRD due to HTN started dialysis Feb 2016  . Gout   . Gout, unspecified 08/13/2009   Qualifier: Diagnosis of  By: Redmond Pulling  MD, Mateo Flow    . H1N1 influenza    March 2016  . History of gout   . History of syphilis   . HIV (human immunodeficiency virus infection) ([place])   . HIV infection ([place]) 1980's   on ART therapy since, followed by ID clinic, complicated  by neuropathy  . HTN (hypertension)   . Hyperlipidemia    hypertrygliceridemia determined ti be secondary to ART therpay  . Hypertension   . HYPERTENSION 05/08/2006  . 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 Pneumonia   . Rib fractures 01/2009  . Seizure disorder ([place])   . Seizures ([place])    last seizure was >5 years ago, pt has family history of seizures  . 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 Syphilis 1997   history of syphilis 1997  . Tobacco abuse    Review of Systems:   Review of Systems  Constitutional: Negative for chills, diaphoresis and fever.  Genitourinary: Negative for dysuria.       Severe pain in R scrotum, no discharge      Physical Exam:  Vitals:   03/19/17 1022  BP: (!) 181/92  Pulse: 83  Temp: 98.3 F (36.8 C)  TempSrc: Oral  SpO2: 98%  Weight: 255 lb 4.8 oz (115.8 kg)  Height: 5\' 9"  (1.753 m)   General:Pleasant male, well-nourished, well-developed, in pain but in no acute distress Derm: difficult to  examine as wound is packed and dressed, healing wound observed on R scrotum, no discharge, erythema, or warmth noted     Assessment & Plan:   See Encounters Tab for problem based charting.  Patient seen with Dr. Dareen Piano

## 2017-03-19 NOTE — Patient Outreach (Signed)
[facility]) Care Management  [place] for 03/17/17   03/19/2017  Darrell Johnson Feb 19, 1966 [id number]  Subjective: Patient was called regarding medication management and review post discharge. HIPAA identifiers were obtained.  Patient is a 51 year old male with multiple medical conditions including but not limited to: CHF, ESRD on hemodialysis, chronic anemia, and well controlled HIV.  Patient was recently hospitalized for scrotal abscess.  Patient reported managing his medications on his own. He does not use a pill box but said he would be open to using one. He did not report any issues with his medications.    Objective:   Encounter Medications: Outpatient Encounter Prescriptions as of 03/17/2017  Medication Sig Note  . acetaminophen (TYLENOL) 500 MG tablet Take 1,000 mg by mouth every 6 (six) hours as needed for mild pain.   Marland Kitchen albuterol (PROVENTIL HFA;VENTOLIN HFA) 108 (90 Base) MCG/ACT inhaler Inhale 2 puffs into the lungs every 6 (six) hours as needed for wheezing or shortness of breath.   Marland Kitchen albuterol (PROVENTIL) (2.5 MG/3ML) 0.083% nebulizer solution Take 3 mLs (2.5 mg total) by nebulization every 6 (six) hours as needed for wheezing or shortness of breath.   Marland Kitchen amLODipine (NORVASC) 5 MG tablet Take 10 mg by mouth daily.   Marland Kitchen buPROPion (WELLBUTRIN) 75 MG tablet TAKE 1/2 TABLET TWICE A DAY   . carvedilol (COREG) 25 MG tablet TAKE 1 TABLET (25 MG TOTAL) BY MOUTH 2 (TWO) TIMES DAILY WITH A MEAL. (Patient taking differently: Take 25 mg by mouth 2 (two) times daily with a meal. )   . doxazosin (CARDURA) 8 MG tablet TAKE 1 TABLET (8 MG TOTAL) BY MOUTH AT BEDTIME. (Patient taking differently: Take 8 mg by mouth at bedtime. )   . EDURANT 25 MG TABS tablet TAKE 1 TABLET (25 MG TOTAL) BY MOUTH DAILY WITH BREAKFAST.   . ferric citrate (AURYXIA) 1 GM 210 MG(Fe) tablet Take 420 mg by mouth 2 (two) times daily with a meal.  03/17/2017: Patient is not taking  Renvela -per patient  . furosemide (LASIX) 40 MG tablet Take 1 tablet (40 mg total) by mouth daily.   . hydrALAZINE (APRESOLINE) 25 MG tablet Take 1 tablet (25 mg total) by mouth 3 (three) times daily.   . [EXPIRED] HYDROcodone-acetaminophen (NORCO) 5-325 MG tablet Take 1 tablet by mouth every 6 (six) hours as needed for severe pain.   . montelukast (SINGULAIR) 10 MG tablet Take 1 tablet (10 mg total) by mouth at bedtime. 03/17/2017: Taking PRN  . pravastatin (PRAVACHOL) 40 MG tablet TAKE 1 TABLET (40 MG TOTAL) BY MOUTH EVERY EVENING.   Marland Kitchen sulfamethoxazole-trimethoprim (BACTRIM,SEPTRA) 400-80 MG tablet Take 1 tablet by mouth daily. On dialysis days, you must take tablet AFTER dialysis. Take medicine daily until you run out.   Marland Kitchen TIVICAY 50 MG tablet TAKE 1 TABLET (50 MG TOTAL) BY MOUTH DAILY. 03/09/2017: #30 filled 02/12/17 [facility]  . VIREAD 300 MG tablet TAKE 1 TABLET BY MOUTH ONCE A WEEK (Patient taking differently: TAKE 1 TABLET BY MOUTH ONCE A WEEK ON SUNDAYS) 03/09/2017: #4 FILLED 02/12/17 [facility]  . Febuxostat (ULORIC) 80 MG TABS Take 80 mg by mouth daily as needed (gout).  03/09/2017: #30 [facility]  . [DISCONTINUED] calcitRIOL (ROCALTROL) 0.5 MCG capsule Take by mouth. 03/09/2017: Last filled 2016 per [facility] records - need to ask pt if he is still taking this   No facility-administered encounter medications on file as of 03/17/2017.  Functional Status: In your present state of health, do you have any difficulty performing the following activities: 03/05/2017 10/09/2016  Hearing? N N  Vision? N N  Difficulty concentrating or making decisions? N N  Walking or climbing stairs? N N  Dressing or bathing? N N  Doing errands, shopping? N N  Some recent data might be hidden    Fall/Depression Screening: Fall Risk  03/05/2017 10/09/2016 10/01/2016  Falls in the past year? No No No  Risk for fall due to : - - -   PHQ 2/9 Scores 03/05/2017 10/09/2016 10/01/2016 09/11/2016 09/04/2016  06/03/2016 04/08/2016  PHQ - 2 Score 0 0 0 0 0 0 0  PHQ- 9 Score - - - - - - -      Assessment: Patient was recently discharged from hospital and all medications have been reviewed.  Patient said he was able to access all of his medications post discharge and started Sulfamethoxazole-TMP as instructed. (Last day of therapy should be 03/19/17)   Drugs sorted by system:  Neurologic/Psychologic: Bupropion  Cardiovascular: Amlodipine Carvedilol Furosemide Hydralazine Pravastatin  Pulmonary/Allergy: Albuterol HFA Albuterol Inhalation solution Montelukast Gastrointestinal:  Renal: Auryxia 1GM Calcitriol (Patient did not remember this medication)  Pain: Acetaminophen Hydrocodone/APAP  Vitamins/Minerals:  Infectious Diseases: Endurant Sulfamethoxazole-trimethoprim Tivicay Viread  Miscellaneous: Uloric Doxazosin  Medication Review Findings:  Medication List Discrepancy- The most recent discharge summary listed Calcitriol.  Patient did not remember this medication and said he is/was not taking Calcitriol. Available Surescripts fill history did not show Calcitriol  being on the patient's active medication list. (Calcitriol was removed from the medication)  Patient was offered a Pharmacy Home visit to provide him with a Pill Box and instructions on how to use it.  He declined the visit for now because he said his home is being renovated.  He said he would go and purchase a pill box and attempt to use it on his own.    He did agree to allow pharmacist to call him back in 10-14 days to check on him.  Plan:  Route note to patient's PCP  Call patient back in 10-14 business days  Close Pharmacy case at that time if no issues   Elayne Guerin, PharmD, [facility] Clinical Pharmacist [phone]

## 2017-03-19 NOTE — Assessment & Plan Note (Signed)
Patient presents today for hospital follow-up after being treated for scrotal abscess. Underwent I&D by urology. Discharged on vancomycin and ceftazidime pending culture data. Cultures grew Staphylococcus hemolyticus and patient switched to oral Bactrim. Has currently completed 2 days of therapy and has 2 more days remaining. He was also given 20 tablets of Norco 5 for severe pain. Continues to complain of severe pain today, especially when changing packing which he does twice daily. He is scheduled to follow-up with urology this afternoon.  - Norco 5 mg Q6h as needed for pain, 20 tablets prescribed

## 2017-03-19 NOTE — Progress Notes (Signed)
Internal Medicine Clinic Attending  I saw and evaluated the patient.  I personally confirmed the key portions of the history and exam documented by Dr. Santos-Sanchez and I reviewed pertinent patient test results.  The assessment, diagnosis, and plan were formulated together and I agree with the documentation in the resident's note. 

## 2017-03-20 DIAGNOSIS — D631 Anemia in chronic kidney disease: Secondary | ICD-10-CM | POA: Diagnosis not present

## 2017-03-20 DIAGNOSIS — Z992 Dependence on renal dialysis: Secondary | ICD-10-CM | POA: Diagnosis not present

## 2017-03-20 DIAGNOSIS — N186 End stage renal disease: Secondary | ICD-10-CM | POA: Diagnosis not present

## 2017-03-20 DIAGNOSIS — N492 Inflammatory disorders of scrotum: Secondary | ICD-10-CM | POA: Diagnosis not present

## 2017-03-20 DIAGNOSIS — N2581 Secondary hyperparathyroidism of renal origin: Secondary | ICD-10-CM | POA: Diagnosis not present

## 2017-03-20 DIAGNOSIS — E8779 Other fluid overload: Secondary | ICD-10-CM | POA: Diagnosis not present

## 2017-03-20 DIAGNOSIS — I120 Hypertensive chronic kidney disease with stage 5 chronic kidney disease or end stage renal disease: Secondary | ICD-10-CM | POA: Diagnosis not present

## 2017-03-23 DIAGNOSIS — J810 Acute pulmonary edema: Secondary | ICD-10-CM | POA: Diagnosis not present

## 2017-03-23 DIAGNOSIS — N186 End stage renal disease: Secondary | ICD-10-CM | POA: Diagnosis not present

## 2017-03-23 DIAGNOSIS — D631 Anemia in chronic kidney disease: Secondary | ICD-10-CM | POA: Diagnosis not present

## 2017-03-23 DIAGNOSIS — Z23 Encounter for immunization: Secondary | ICD-10-CM | POA: Diagnosis not present

## 2017-03-23 DIAGNOSIS — N2581 Secondary hyperparathyroidism of renal origin: Secondary | ICD-10-CM | POA: Diagnosis not present

## 2017-03-25 ENCOUNTER — Other Ambulatory Visit: Payer: Self-pay | Admitting: *Deleted

## 2017-03-25 DIAGNOSIS — D631 Anemia in chronic kidney disease: Secondary | ICD-10-CM | POA: Diagnosis not present

## 2017-03-25 DIAGNOSIS — Z23 Encounter for immunization: Secondary | ICD-10-CM | POA: Diagnosis not present

## 2017-03-25 DIAGNOSIS — J810 Acute pulmonary edema: Secondary | ICD-10-CM | POA: Diagnosis not present

## 2017-03-25 DIAGNOSIS — N2581 Secondary hyperparathyroidism of renal origin: Secondary | ICD-10-CM | POA: Diagnosis not present

## 2017-03-25 DIAGNOSIS — N186 End stage renal disease: Secondary | ICD-10-CM | POA: Diagnosis not present

## 2017-03-25 DIAGNOSIS — I10 Essential (primary) hypertension: Secondary | ICD-10-CM

## 2017-03-25 MED ORDER — HYDRALAZINE HCL 25 MG PO TABS: 25.0000 mg | ORAL_TABLET | Freq: Three times a day (TID) | ORAL | 1 refills | Status: DC

## 2017-03-26 DIAGNOSIS — Z992 Dependence on renal dialysis: Secondary | ICD-10-CM | POA: Diagnosis not present

## 2017-03-26 DIAGNOSIS — N186 End stage renal disease: Secondary | ICD-10-CM | POA: Diagnosis not present

## 2017-03-26 DIAGNOSIS — I871 Compression of vein: Secondary | ICD-10-CM | POA: Diagnosis not present

## 2017-03-27 DIAGNOSIS — D631 Anemia in chronic kidney disease: Secondary | ICD-10-CM | POA: Diagnosis not present

## 2017-03-27 DIAGNOSIS — J810 Acute pulmonary edema: Secondary | ICD-10-CM | POA: Diagnosis not present

## 2017-03-27 DIAGNOSIS — N186 End stage renal disease: Secondary | ICD-10-CM | POA: Diagnosis not present

## 2017-03-27 DIAGNOSIS — Z23 Encounter for immunization: Secondary | ICD-10-CM | POA: Diagnosis not present

## 2017-03-27 DIAGNOSIS — N2581 Secondary hyperparathyroidism of renal origin: Secondary | ICD-10-CM | POA: Diagnosis not present

## 2017-03-29 ENCOUNTER — Other Ambulatory Visit: Payer: Self-pay

## 2017-03-29 ENCOUNTER — Encounter (HOSPITAL_COMMUNITY): Payer: Self-pay | Admitting: *Deleted

## 2017-03-29 ENCOUNTER — Emergency Department (HOSPITAL_COMMUNITY): Payer: Medicare Other

## 2017-03-29 ENCOUNTER — Inpatient Hospital Stay (HOSPITAL_COMMUNITY)
Admission: EM | Admit: 2017-03-29 | Discharge: 2017-03-31 | DRG: 291 | Disposition: A | Discharge status: Home / Self Care | Payer: Medicare Other | Attending: Oncology | Admitting: Oncology

## 2017-03-29 DIAGNOSIS — Z6834 Body mass index (BMI) 34.0-34.9, adult: Secondary | ICD-10-CM

## 2017-03-29 DIAGNOSIS — D72829 Elevated white blood cell count, unspecified: Secondary | ICD-10-CM | POA: Diagnosis present

## 2017-03-29 DIAGNOSIS — Z7982 Long term (current) use of aspirin: Secondary | ICD-10-CM

## 2017-03-29 DIAGNOSIS — Z87891 Personal history of nicotine dependence: Secondary | ICD-10-CM | POA: Diagnosis not present

## 2017-03-29 DIAGNOSIS — M898X9 Other specified disorders of bone, unspecified site: Secondary | ICD-10-CM | POA: Diagnosis present

## 2017-03-29 DIAGNOSIS — Z992 Dependence on renal dialysis: Secondary | ICD-10-CM | POA: Diagnosis not present

## 2017-03-29 DIAGNOSIS — D631 Anemia in chronic kidney disease: Secondary | ICD-10-CM | POA: Diagnosis present

## 2017-03-29 DIAGNOSIS — M109 Gout, unspecified: Secondary | ICD-10-CM | POA: Diagnosis present

## 2017-03-29 DIAGNOSIS — M1990 Unspecified osteoarthritis, unspecified site: Secondary | ICD-10-CM | POA: Diagnosis present

## 2017-03-29 DIAGNOSIS — J810 Acute pulmonary edema: Secondary | ICD-10-CM | POA: Diagnosis not present

## 2017-03-29 DIAGNOSIS — J9601 Acute respiratory failure with hypoxia: Secondary | ICD-10-CM | POA: Diagnosis not present

## 2017-03-29 DIAGNOSIS — B20 Human immunodeficiency virus [HIV] disease: Secondary | ICD-10-CM | POA: Diagnosis not present

## 2017-03-29 DIAGNOSIS — Z79899 Other long term (current) drug therapy: Secondary | ICD-10-CM

## 2017-03-29 DIAGNOSIS — I5023 Acute on chronic systolic (congestive) heart failure: Secondary | ICD-10-CM | POA: Diagnosis present

## 2017-03-29 DIAGNOSIS — N186 End stage renal disease: Secondary | ICD-10-CM | POA: Diagnosis not present

## 2017-03-29 DIAGNOSIS — I120 Hypertensive chronic kidney disease with stage 5 chronic kidney disease or end stage renal disease: Secondary | ICD-10-CM | POA: Diagnosis not present

## 2017-03-29 DIAGNOSIS — Z6837 Body mass index (BMI) 37.0-37.9, adult: Secondary | ICD-10-CM

## 2017-03-29 DIAGNOSIS — I160 Hypertensive urgency: Secondary | ICD-10-CM | POA: Diagnosis present

## 2017-03-29 DIAGNOSIS — G40909 Epilepsy, unspecified, not intractable, without status epilepticus: Secondary | ICD-10-CM | POA: Diagnosis present

## 2017-03-29 DIAGNOSIS — I132 Hypertensive heart and chronic kidney disease with heart failure and with stage 5 chronic kidney disease, or end stage renal disease: Principal | ICD-10-CM | POA: Diagnosis present

## 2017-03-29 DIAGNOSIS — I10 Essential (primary) hypertension: Secondary | ICD-10-CM | POA: Diagnosis present

## 2017-03-29 DIAGNOSIS — N2581 Secondary hyperparathyroidism of renal origin: Secondary | ICD-10-CM | POA: Diagnosis present

## 2017-03-29 DIAGNOSIS — R069 Unspecified abnormalities of breathing: Secondary | ICD-10-CM | POA: Diagnosis not present

## 2017-03-29 DIAGNOSIS — R0602 Shortness of breath: Secondary | ICD-10-CM | POA: Diagnosis not present

## 2017-03-29 DIAGNOSIS — E785 Hyperlipidemia, unspecified: Secondary | ICD-10-CM | POA: Diagnosis present

## 2017-03-29 DIAGNOSIS — I43 Cardiomyopathy in diseases classified elsewhere: Secondary | ICD-10-CM | POA: Diagnosis present

## 2017-03-29 DIAGNOSIS — R000 Tachycardia, unspecified: Secondary | ICD-10-CM | POA: Diagnosis present

## 2017-03-29 LAB — I-STAT ARTERIAL BLOOD GAS, ED
Acid-Base Excess: 7 mmol/L — ABNORMAL HIGH (ref 0.0–2.0)
BICARBONATE: 32.8 mmol/L — AB (ref 20.0–28.0)
O2 Saturation: 100 %
PCO2 ART: 51.9 mmHg — AB (ref 32.0–48.0)
Patient temperature: 97.2
TCO2: 34 mmol/L — AB (ref 22–32)
pH, Arterial: 7.406 (ref 7.350–7.450)
pO2, Arterial: 459 mmHg — ABNORMAL HIGH (ref 83.0–108.0)

## 2017-03-29 LAB — TROPONIN I: Troponin I: 0.03 ng/mL (ref <0.03)

## 2017-03-29 LAB — BASIC METABOLIC PANEL
Anion gap: 13 (ref 5–15)
BUN: 35 mg/dL — AB (ref 6–20)
CHLORIDE: 102 mmol/L (ref 101–111)
CO2: 24 mmol/L (ref 22–32)
Calcium: 8.9 mg/dL (ref 8.9–10.3)
Creatinine, Ser: 10.23 mg/dL — ABNORMAL HIGH (ref 0.61–1.24)
GFR calc Af Amer: 6 mL/min — ABNORMAL LOW (ref >60)
GFR calc non Af Amer: 5 mL/min — ABNORMAL LOW (ref >60)
Glucose, Bld: 154 mg/dL — ABNORMAL HIGH (ref 65–99)
Potassium: 4.6 mmol/L (ref 3.5–5.1)
SODIUM: 139 mmol/L (ref 135–145)

## 2017-03-29 LAB — ALBUMIN: ALBUMIN: 3.4 g/dL — AB (ref 3.5–5.0)

## 2017-03-29 LAB — CBC
HEMATOCRIT: 29.7 % — AB (ref 39.0–52.0)
HEMOGLOBIN: 9 g/dL — AB (ref 13.0–17.0)
MCH: 28.6 pg (ref 26.0–34.0)
MCHC: 30.3 g/dL (ref 30.0–36.0)
MCV: 94.3 fL (ref 78.0–100.0)
Platelets: 268 10*3/uL (ref 150–400)
RBC: 3.15 MIL/uL — ABNORMAL LOW (ref 4.22–5.81)
RDW: 19.9 % — ABNORMAL HIGH (ref 11.5–15.5)
WBC: 17.7 10*3/uL — AB (ref 4.0–10.5)

## 2017-03-29 LAB — RENAL FUNCTION PANEL
ANION GAP: 11 (ref 5–15)
Albumin: 3.3 g/dL — ABNORMAL LOW (ref 3.5–5.0)
BUN: 17 mg/dL (ref 6–20)
CALCIUM: 8.5 mg/dL — AB (ref 8.9–10.3)
CHLORIDE: 95 mmol/L — AB (ref 101–111)
CO2: 27 mmol/L (ref 22–32)
Creatinine, Ser: 5.9 mg/dL — ABNORMAL HIGH (ref 0.61–1.24)
GFR calc Af Amer: 12 mL/min — ABNORMAL LOW (ref >60)
GFR calc non Af Amer: 10 mL/min — ABNORMAL LOW (ref >60)
Glucose, Bld: 85 mg/dL (ref 65–99)
PHOSPHORUS: 2.7 mg/dL (ref 2.5–4.6)
POTASSIUM: 3.4 mmol/L — AB (ref 3.5–5.1)
Sodium: 133 mmol/L — ABNORMAL LOW (ref 135–145)

## 2017-03-29 LAB — I-STAT CG4 LACTIC ACID, ED: Lactic Acid, Venous: 1.6 mmol/L (ref 0.5–1.9)

## 2017-03-29 LAB — PHOSPHORUS: Phosphorus: 6.7 mg/dL — ABNORMAL HIGH (ref 2.5–4.6)

## 2017-03-29 MED ORDER — FUROSEMIDE 40 MG PO TABS: 40.0000 mg | ORAL_TABLET | Freq: Every day | ORAL | Status: DC

## 2017-03-29 MED ORDER — HEPARIN SODIUM (PORCINE) 1000 UNIT/ML DIALYSIS
1000.0000 [IU] | INTRAMUSCULAR | Status: DC | PRN
  Filled 2017-03-29: qty 1

## 2017-03-29 MED ORDER — TENOFOVIR DISOPROXIL FUMARATE 300 MG PO TABS
300.0000 mg | ORAL_TABLET | ORAL | Status: DC
  Administered 2017-03-29: 300 mg via ORAL
  Filled 2017-03-29: qty 1

## 2017-03-29 MED ORDER — RILPIVIRINE HCL 25 MG PO TABS
25.0000 mg | ORAL_TABLET | Freq: Every day | ORAL | Status: DC
  Administered 2017-03-30 – 2017-03-31 (×2): 25 mg via ORAL
  Filled 2017-03-29 (×2): qty 1

## 2017-03-29 MED ORDER — SODIUM CHLORIDE 0.9 % IV SOLN: 100.0000 mL | INTRAVENOUS | Status: DC | PRN

## 2017-03-29 MED ORDER — BUPROPION HCL 75 MG PO TABS
37.5000 mg | ORAL_TABLET | Freq: Two times a day (BID) | ORAL | Status: DC
  Administered 2017-03-29 – 2017-03-31 (×4): 37.5 mg via ORAL
  Filled 2017-03-29 (×4): qty 0.5

## 2017-03-29 MED ORDER — PRAVASTATIN SODIUM 40 MG PO TABS
40.0000 mg | ORAL_TABLET | Freq: Every evening | ORAL | Status: DC
  Administered 2017-03-29 – 2017-03-30 (×2): 40 mg via ORAL
  Filled 2017-03-29 (×2): qty 1

## 2017-03-29 MED ORDER — FEBUXOSTAT 80 MG PO TABS: 80.0000 mg | ORAL_TABLET | Freq: Every day | ORAL | Status: DC | PRN

## 2017-03-29 MED ORDER — ACETAMINOPHEN 325 MG PO TABS
650.0000 mg | ORAL_TABLET | Freq: Four times a day (QID) | ORAL | Status: DC | PRN
  Administered 2017-03-29: 650 mg via ORAL
  Filled 2017-03-29: qty 2

## 2017-03-29 MED ORDER — HYDROCODONE-ACETAMINOPHEN 5-325 MG PO TABS
1.0000 | ORAL_TABLET | Freq: Four times a day (QID) | ORAL | Status: DC | PRN
  Administered 2017-03-29 – 2017-03-30 (×3): 1 via ORAL
  Filled 2017-03-29 (×3): qty 1

## 2017-03-29 MED ORDER — CARVEDILOL 25 MG PO TABS
25.0000 mg | ORAL_TABLET | Freq: Two times a day (BID) | ORAL | Status: DC
  Administered 2017-03-29 – 2017-03-31 (×3): 25 mg via ORAL
  Filled 2017-03-29 (×3): qty 1

## 2017-03-29 MED ORDER — AMLODIPINE BESYLATE 10 MG PO TABS: 10.0000 mg | ORAL_TABLET | Freq: Every day | ORAL | Status: DC

## 2017-03-29 MED ORDER — LIDOCAINE HCL (PF) 1 % IJ SOLN: 5.0000 mL | INTRAMUSCULAR | Status: DC | PRN

## 2017-03-29 MED ORDER — DOLUTEGRAVIR SODIUM 50 MG PO TABS
50.0000 mg | ORAL_TABLET | Freq: Every day | ORAL | Status: DC
  Administered 2017-03-29 – 2017-03-31 (×3): 50 mg via ORAL
  Filled 2017-03-29 (×3): qty 1

## 2017-03-29 MED ORDER — LIDOCAINE-PRILOCAINE 2.5-2.5 % EX CREA
1.0000 "application " | TOPICAL_CREAM | CUTANEOUS | Status: DC | PRN
  Filled 2017-03-29: qty 5

## 2017-03-29 MED ORDER — SEVELAMER CARBONATE 800 MG PO TABS
800.0000 mg | ORAL_TABLET | Freq: Three times a day (TID) | ORAL | Status: DC
  Administered 2017-03-29: 800 mg via ORAL
  Filled 2017-03-29: qty 1

## 2017-03-29 MED ORDER — ALTEPLASE 2 MG IJ SOLR: 2.0000 mg | Freq: Once | INTRAMUSCULAR | Status: DC | PRN

## 2017-03-29 MED ORDER — SODIUM CHLORIDE 0.9 % IV SOLN
100.0000 mL | INTRAVENOUS | Status: DC | PRN
Start: 2017-03-29 — End: 2017-03-30

## 2017-03-29 MED ORDER — DOXERCALCIFEROL 4 MCG/2ML IV SOLN
7.0000 ug | INTRAVENOUS | Status: DC
  Filled 2017-03-29: qty 4

## 2017-03-29 MED ORDER — HEPARIN SODIUM (PORCINE) 1000 UNIT/ML DIALYSIS
20.0000 [IU]/kg | INTRAMUSCULAR | Status: DC | PRN
  Filled 2017-03-29: qty 3

## 2017-03-29 MED ORDER — SODIUM CHLORIDE 0.9% FLUSH
3.0000 mL | Freq: Two times a day (BID) | INTRAVENOUS | Status: DC
  Administered 2017-03-29 – 2017-03-30 (×2): 3 mL via INTRAVENOUS

## 2017-03-29 MED ORDER — DOXAZOSIN MESYLATE 8 MG PO TABS
8.0000 mg | ORAL_TABLET | Freq: Every day | ORAL | Status: DC
  Administered 2017-03-29 – 2017-03-31 (×2): 8 mg via ORAL
  Filled 2017-03-29 (×2): qty 1

## 2017-03-29 MED ORDER — HYDRALAZINE HCL 25 MG PO TABS
25.0000 mg | ORAL_TABLET | Freq: Three times a day (TID) | ORAL | Status: DC
  Administered 2017-03-29 – 2017-03-31 (×3): 25 mg via ORAL
  Filled 2017-03-29 (×3): qty 1

## 2017-03-29 MED ORDER — PENTAFLUOROPROP-TETRAFLUOROETH EX AERO
1.0000 | INHALATION_SPRAY | CUTANEOUS | Status: DC | PRN
Start: 2017-03-29 — End: 2017-03-30
  Filled 2017-03-29: qty 30

## 2017-03-29 MED ORDER — HEPARIN SODIUM (PORCINE) 1000 UNIT/ML DIALYSIS
6000.0000 [IU] | Freq: Once | INTRAMUSCULAR | Status: DC
  Filled 2017-03-29: qty 6

## 2017-03-29 MED ORDER — FUROSEMIDE 10 MG/ML IJ SOLN
60.0000 mg | Freq: Once | INTRAMUSCULAR | Status: AC
  Administered 2017-03-29: 60 mg via INTRAVENOUS
  Filled 2017-03-29: qty 6

## 2017-03-29 MED ORDER — HEPARIN SODIUM (PORCINE) 5000 UNIT/ML IJ SOLN
5000.0000 [IU] | Freq: Three times a day (TID) | INTRAMUSCULAR | Status: DC
  Administered 2017-03-29 – 2017-03-31 (×4): 5000 [IU] via SUBCUTANEOUS
  Filled 2017-03-29 (×4): qty 1

## 2017-03-29 MED ORDER — ACETAMINOPHEN 650 MG RE SUPP: 650.0000 mg | Freq: Four times a day (QID) | RECTAL | Status: DC | PRN

## 2017-03-29 MED ORDER — RAMELTEON 8 MG PO TABS
8.0000 mg | ORAL_TABLET | Freq: Every day | ORAL | Status: DC
  Administered 2017-03-29 – 2017-03-31 (×2): 8 mg via ORAL
  Filled 2017-03-29 (×2): qty 1

## 2017-03-29 MED ORDER — LIDOCAINE HCL (PF) 1 % IJ SOLN
5.0000 mL | INTRAMUSCULAR | Status: DC | PRN
Start: 2017-03-29 — End: 2017-03-30

## 2017-03-29 MED ORDER — FERRIC CITRATE 1 GM 210 MG(FE) PO TABS
420.0000 mg | ORAL_TABLET | Freq: Three times a day (TID) | ORAL | Status: DC
  Administered 2017-03-30 – 2017-03-31 (×2): 420 mg via ORAL
  Filled 2017-03-29 (×8): qty 2

## 2017-03-29 MED ORDER — PENTAFLUOROPROP-TETRAFLUOROETH EX AERO: 1.0000 "application " | INHALATION_SPRAY | CUTANEOUS | Status: DC | PRN

## 2017-03-29 MED ORDER — ASPIRIN EC 81 MG PO TBEC
81.0000 mg | DELAYED_RELEASE_TABLET | Freq: Every day | ORAL | Status: DC
  Administered 2017-03-30 – 2017-03-31 (×2): 81 mg via ORAL
  Filled 2017-03-29 (×2): qty 1

## 2017-03-29 MED ORDER — LIDOCAINE-PRILOCAINE 2.5-2.5 % EX CREA: 1.0000 "application " | TOPICAL_CREAM | CUTANEOUS | Status: DC | PRN

## 2017-03-29 NOTE — ED Triage Notes (Signed)
Pt to ED from home by [facility] c/o SOB. Per EMS, initial sats 52%, pt was tripoding, nonverbal, following some commands. Pt following commands on arrival, diaphoretic; bi-pap placed. Pt has dialysis treatments on MWF, last treatment on Friday.

## 2017-03-29 NOTE — Procedures (Signed)
On dialysis.  No hemodynamic instability.  He is asleep now resting.  BP is ok. Darrell Johnson

## 2017-03-29 NOTE — ED Notes (Signed)
Remains on bipap, is resting, wife at bedside.

## 2017-03-29 NOTE — H&P (Signed)
Date: 03/29/2017               Patient Name:  Darrell Johnson MRN: [id number]  DOB: 1966-03-29 Age / Sex: 51 y.o., male   PCP: Jule Ser, DO         Medical Service: Internal Medicine Teaching Service         Attending Physician: Dr. Annia Belt, MD    First Contact: Dr. Lurlean Horns Pager: [phone]  Second Contact: Dr. Posey Pronto  Pager: [phone]       After Hours (After 5p/  First Contact Pager: [phone]  weekends / holidays): Second Contact Pager: [phone]   Chief Complaint: Shortness of breath   History of Present Illness:  Darrell Johnson is a 51 y.o. male with history of well-controlled HIV, ESRD on HD, CHF EF 55% 09/2014, HTN, HLD, chronic anemia, and gout who presents with acute hypoxic respiratory failure in the setting of significant elevated BP with sBP in the 300s. Wife is present in room and provides part of the history as patient is on BiPAP and difficult to understand. Patient was in his usual state of health until a few days ago when he started to experience mild shortness of breath which has progressively worsened. He woke up suddenly this morning gasping for air and called EMS. He denies orthopnea and PND, but per wife, patient has been sleeping on a recliner for the past 3 days. States he has been compliant with antihypertensive and HF medications and wife confirmed this. He has HD on MWF with last session on F 9/7. Denies fever, chills, chest pain, palpitations, and cough.   ED course: Patient was tachycardic 110s, hypoxic 52%, and hypertensive 182/109 on arrival to ED. He was placed on BiPAP with improvement in respiratory status. He also received IV Lasix 60 mg x1 with significant improvement in BP, 130/81. EKG without signs of acute ischemia and troponin negative x1. CXR with interstitial and alveolar edema.   Meds:  Amlodipine 10 mg QD  Wellbutrin 37.5 mg QD  Coreg 25 mg BID  Doxazosin 8 mg QD  Hydralazine 25 mg TID  Febuxostat 80 mg QD PRN    Lasix 40 mg QD  Pravastatin 40 mg QD  Tivicay 50 mg QD  Edurant 25 mg QD  Viread 300 mg QD    Allergies: Allergies as of 03/29/2017  . (No Known Allergies)   Past Medical History:  Diagnosis Date  . Anemia   . Arthritis   . CHF (congestive heart failure) ([place])   . Chronic kidney disease    stage 3-4 CKD, followed by Dr. Moshe Cipro  . ESRD (end stage renal disease) ([place])    ESRD due to HTN started dialysis Feb 2016  . Gout   . Gout, unspecified 08/13/2009   Qualifier: Diagnosis of  By: Redmond Pulling  MD, Mateo Flow    . H1N1 influenza    March 2016  . History of gout   . History of syphilis   . HIV (human immunodeficiency virus infection) ([place])   . HIV infection ([place]) 1980's   on ART therapy since, followed by ID clinic, complicated  by neuropathy  . HTN (hypertension)   . Hyperlipidemia    hypertrygliceridemia determined ti be secondary to ART therpay  . Hypertension   . HYPERTENSION 05/08/2006  . 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 Pneumonia   . Rib fractures 01/2009  . Seizure disorder ([place])   . Seizures ([place])    last seizure was >5  years ago, pt has family history of seizures  . 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 Syphilis 1997   history of syphilis 1997  . Tobacco abuse     Family History:  Family History  Problem Relation Age of Onset  . Cancer Mother   . Hypertension Mother   . COPD Father   . Hypertension Father   . Diabetes Sister   . Hypertension Sister   . Diabetes Brother   . Hypertension Brother   . Stroke Neg Hx     Social History:  Social History  Substance Use Topics  . Smoking status: Former Smoker    Packs/day: 0.40    Years: 20.00    Start date: 01/10/2016    Quit date: 04/07/2016  . Smokeless tobacco: Never Used  . Alcohol use No    Review of Systems: A complete ROS was negative except as per  HPI.   Physical Exam: Blood pressure 128/77, pulse 63, temperature (!) 97.2 F (36.2 C), temperature source Axillary, resp. rate 13, SpO2 100 %.  General: pleasant male, obese, well-developed, wearing BiPAP but in no acute distress  Cardiac: distant heart sounds, regular rate and rhythm, II/VI systolic murmur best heard at R upper sternal border, no JVD noted  Pulm: decreased breath sounds at lung bases and diffuse coarse breath sounds with scattered inspiratory and expiratory wheezes. Abd: soft, NTND, bowel sounds present Ext: warm and well perfused, no peripheral edema, 2+ DP pulses bilaterally, calf tenderness not present  Derm: R scrotal abscess is healing well, with no erythema, warmth or discharge noted, patient continues to have tenderness to palpation though much improved    Labs:  CBC WBC 17.7  H/H 9/29.7  Plt 268 BMP 139  4.6  102  24 25  10.23  154 ABG 7.4/51.9/459/32 Lactic acid 1.6 Troponin negative x1   EKG: personally reviewed my interpretation is sinus tachycardia t 115 bpm, nl intervals, no axis deviation or LVH, left atrial enlargement noted, J point elevation on V3-V4, no signs of acute ischemia noted   CXR: personally reviewed my interpretation is patent airway, enlargement of cardiac silhouette noted, significant pulmonary edema noted, R pleural effusion, no consolidations seen   Assessment & Plan by Problem:  Darrell Johnson is a 51 y.o. male with history of well-controlled HIV, ESRD on HD, CHF EF 55% 09/2014, HTN, HLD, chronic anemia, and gout who presents with acute hypoxic respiratory failure in the setting of hypertensive crisis with sBP in the 300s likely secondary to volume overload given marked improvement after IV Lasix. On exam, patient has decreased breath sounds at lung bases and diffuse coarse breath sounds with scattered inspiratory and expiratory wheezes. Lab work significant for leukocytosis of 17.7.   # Acute hypoxic respiratory failure: Concern for  flash pulmonary edema given acute onset of hypoxia (50's) in the setting of hypertensive crisis likely secondary to volume overload. Reports compliance with home antihypertensives and HF medications. His wife confirmed this. Last HD session 9/7 and patient completed it. Initially placed on NBR by EMS with minimal improvement in respiratory status and escalated to BiPAP on arrival to ED. On BiPAP when seen and satting 100%. On exam, patient has decreased breath sounds at lung bases and diffuse coarse breath sounds with scattered inspiratory and expiratory wheezes. No JVD or LE edema noted.  -  s/p IV Lasix 60 mg. Will continue home PO Lasix 40 mg QD.  - EKG nl and troponin negative x1 - Continue BiPAP, will wean as tolerated  - Trending troponin  - On telemetry  - Daily weights  - EKG in AM   # Leukocytosis: WBC 17.7 from 15.7 on prior admission (8/23) for R scrotal abscess s/p I &D. He completed a course of Bactrim. On exam, scrotal abscess is healing well, with no erythema, warmth or discharge noted. Concern for ongoing infection, however patient is afebrile and hemodynamically stable, and reports no recent illnesses. CXR with interstitial and alveolar edema, but no focal consolidations. No urinary symptoms. Patient is not on steroids.  - Repeat CBC 9/10 - Consider UA and blood cultures if WBC continues to uptrend   # HTN: Patient was found with sBP in the 300s. BP 130/81 on arrival to the ED. Currently  normotensive.  - Continue home amlodipine 10 mg QD + Coreg 25 mg BID + hydralazine 25 mg TID + doxazosin 8 mg QD   # ESRD on HD: HD on MWF. Last HD on 9/7.  - Nephrology following, plan for HD session today  # HFpEF, HDL: Diagnosed 2014. EF 55% on TTE 09/2014. Nuclear stress test normal on 11/2015. Unclear if patient follows up with cardiology as no notes seen in chart.  - Continue home ASA 81 mg QD + Coreg 25 mg BID  - Continue home PO Lasix 40 mg QD  - Continue home pravastatin 40 mg daily     # Chronic anemia: Baseline Hgb 8-10. On ferric citrate at home.  - Will continue to monitor   # HIV, well-controlled: Diagnosed in 1980s. CD4 600 and viral load 28 on 09/2016. On Tivicay, Edurant, and Viread. Last saw [name] with ID on 10/01/2016.  - Continue home Tivicay, [place], and Viread   # Gout: currently asymptomatic  - Continue home febuxostat 80 mg daily PRN   F: None  E: Will monitor and replete as needed N: NPO while on BiPAP   VTE ppx: SQ hepatin   Code status: Full code, not confirmed on admission   Dispo: Admit patient to Observation with expected length of stay less than 2 midnights.  Signed: Welford Roche, MD  Internal Medicine PGY-1  P [phone]

## 2017-03-29 NOTE — ED Notes (Signed)
Spoke with dialysis, they report he will be coming today for treatment, in the next hour or two.

## 2017-03-29 NOTE — ED Notes (Signed)
Pt moved to POD D 36, bipap turned off, pt on 4 L [place], 100%

## 2017-03-29 NOTE — ED Notes (Signed)
Report given to dialysis RN. Pt is off bipap, on 4 L , 100%.

## 2017-03-29 NOTE — Consult Note (Signed)
[facility] Renal Consultation Note    Indication for Consultation:  Management of ESRD/hemodialysis; anemia, hypertension/volume and secondary hyperparathyroidism PCP:  HPI: Darrell Johnson is a 51 y.o. male with ESRD on hemodialysis MWF at [facility]. PMH significant for HTN, HIV, STDS, gout, CHF, morbid obesity, seizure disorder, tobacco abuse. Recent adm [facility] 08/20-08/23/18 for scrotal abscess with I & D per urology.   Patient presented to ED this AM with severe SOB. Patient is currently on BIPAP, minimally responsive. HPI obtained from wife and EMR. Wife states that he has been C/O of SOB for past three days. EDW was lowered at HD 03/25/17 to 112 kg. Patient had HD 03/27/17 ran full treatment left 1.1 kg above EDW. This AM he awoke around 0530 with C/O SOB. Wife said he was crawling on floor and she called EMS. EMS reported on arrival that patient was minimally responsive and O2 Sats were 50% on RA. CXR showed Cardiomegaly, interstitial and alveolar edema. Small pleural effusions. EKG showed ST. Na 139 K+ 4.6 Scr 10.2 BUN 35 WBC 17.7  HGB 9.0 PLT 268. Lactic acid 1.6. He has been placed on BIPAP O2 sats 100%. EMS reported SBP >300 however BP is controlled now 128/77. He is being admitted for acute hypoxic respiratory failure/pulmonary edema. He is will have urgent HD today and again tomorrow to lower volume.    Past Medical History:  Diagnosis Date  . Anemia   . Arthritis   . CHF (congestive heart failure) ([place] [address])   . Chronic kidney disease    stage 3-4 CKD, followed by Dr. Moshe Cipro  . ESRD (end stage renal disease) ([place])    ESRD due to HTN started dialysis Feb 2016  . Gout   . Gout, unspecified 08/13/2009   Qualifier: Diagnosis of  By: Redmond Pulling  MD, Mateo Flow    . H1N1 influenza    March 2016  . History of gout   . History of syphilis   . HIV (human immunodeficiency virus infection) ([place])   . HIV infection ([place]) 1980's   on ART therapy since,  followed by ID clinic, complicated  by neuropathy  . HTN (hypertension)   . Hyperlipidemia    hypertrygliceridemia determined ti be secondary to ART therpay  . Hypertension   . HYPERTENSION 05/08/2006  . 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 Pneumonia   . Rib fractures 01/2009  . Seizure disorder ([place])   . Seizures ([place])    last seizure was >5 years ago, pt has family history of seizures  . 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 Syphilis 1997   history of syphilis 1997  . Tobacco abuse    Past Surgical History:  Procedure Laterality Date  . ABSCESS DRAINAGE    . AV FISTULA PLACEMENT Right 12/02/2013   Procedure: ARTERIOVENOUS (AV) FISTULA CREATION;  Surgeon: Rosetta Posner, MD;  Location: [place];  Service: Vascular;  Laterality: Right;  . INCISION AND DRAINAGE ABSCESS Right 03/09/2017   Procedure: INCISION AND DRAINAGE Right Scrotal Abscess;  Surgeon: Franchot Gallo, MD;  Location: [place];  Service: Urology;  Laterality: Right;   Family History  Problem Relation Age of Onset  . Cancer Mother   . Hypertension Mother   . COPD Father   . Hypertension Father   . Diabetes Sister   . Hypertension Sister   .  Diabetes Brother   . Hypertension Brother   . Stroke Neg Hx    Social History:  reports that he quit smoking about a year ago. He started smoking about 14 months ago. He has a 8.00 pack-year smoking history. He has never used smokeless tobacco. He reports that he does not drink alcohol or use drugs. No Known Allergies Prior to Admission medications   Medication Sig Start Date End Date Taking? Authorizing Provider  acetaminophen (TYLENOL) 500 MG tablet Take 1,000 mg by mouth every 6 (six) hours as needed for mild pain.    [provider]  albuterol (PROVENTIL HFA;VENTOLIN HFA) 108 (90 Base) MCG/ACT inhaler Inhale 2 puffs into the lungs  every 6 (six) hours as needed for wheezing or shortness of breath. 10/02/16   Jule Ser, DO  albuterol (PROVENTIL) (2.5 MG/3ML) 0.083% nebulizer solution Take 3 mLs (2.5 mg total) by nebulization every 6 (six) hours as needed for wheezing or shortness of breath. 09/04/16   Jule Ser, DO  amLODipine (NORVASC) 5 MG tablet Take 10 mg by mouth daily. 12/06/16   [provider]  buPROPion (WELLBUTRIN) 75 MG tablet TAKE 1/2 TABLET TWICE A DAY 02/10/17   Jule Ser, DO  carvedilol (COREG) 25 MG tablet TAKE 1 TABLET (25 MG TOTAL) BY MOUTH 2 (TWO) TIMES DAILY WITH A MEAL. Patient taking differently: Take 25 mg by mouth 2 (two) times daily with a meal.  09/11/16   Jule Ser, DO  doxazosin (CARDURA) 8 MG tablet TAKE 1 TABLET (8 MG TOTAL) BY MOUTH AT BEDTIME. Patient taking differently: Take 8 mg by mouth at bedtime.  09/11/16   Jule Ser, DO  EDURANT 25 MG TABS tablet TAKE 1 TABLET (25 MG TOTAL) BY MOUTH DAILY WITH BREAKFAST. 12/22/16   Carlyle Basques, MD  Febuxostat (ULORIC) 80 MG TABS Take 80 mg by mouth daily as needed (gout).     [provider]  ferric citrate (AURYXIA) 1 GM 210 MG(Fe) tablet Take 420 mg by mouth 2 (two) times daily with a meal.     [provider]  furosemide (LASIX) 40 MG tablet Take 1 tablet (40 mg total) by mouth daily. 09/11/16   Jule Ser, DO  hydrALAZINE (APRESOLINE) 25 MG tablet Take 1 tablet (25 mg total) by mouth 3 (three) times daily. 03/25/17   Jule Ser, DO  HYDROcodone-acetaminophen (NORCO) 5-325 MG tablet Take 1 tablet by mouth every 6 (six) hours as needed for moderate pain or severe pain. 03/19/17   Santos-Sanchez, Merlene Morse, MD  montelukast (SINGULAIR) 10 MG tablet Take 1 tablet (10 mg total) by mouth at bedtime. 12/17/16   Jule Ser, DO  pravastatin (PRAVACHOL) 40 MG tablet TAKE 1 TABLET (40 MG TOTAL) BY MOUTH EVERY EVENING. 01/27/17 01/27/18  Jule Ser, DO  TIVICAY 50 MG tablet TAKE 1 TABLET (50 MG TOTAL) BY  MOUTH DAILY. 12/22/16   Carlyle Basques, MD  VIREAD 300 MG tablet TAKE 1 TABLET BY MOUTH ONCE A WEEK Patient taking differently: TAKE 1 TABLET BY MOUTH ONCE A WEEK ON SUNDAYS 01/06/17   Carlyle Basques, MD   Current Facility-Administered Medications  Medication Dose Route Frequency Provider Last Rate Last Dose  . acetaminophen (TYLENOL) tablet 650 mg  650 mg Oral Q6H PRN Zada Finders, MD       Or  . acetaminophen (TYLENOL) suppository 650 mg  650 mg Rectal Q6H PRN Zada Finders, MD      . amLODipine (NORVASC) tablet 10 mg  10 mg Oral Daily Zada Finders,  MD      . aspirin EC tablet 81 mg  81 mg Oral Daily Zada Finders, MD      . buPROPion [facility]) tablet 37.5 mg  37.5 mg Oral BID Zada Finders, MD      . carvedilol (COREG) tablet 25 mg  25 mg Oral BID WC Zada Finders, MD      . dolutegravir (TIVICAY) tablet 50 mg  50 mg Oral Daily Zada Finders, MD      . doxazosin (CARDURA) tablet 8 mg  8 mg Oral QHS Zada Finders, MD      . Febuxostat TABS 80 mg  80 mg Oral Daily PRN Zada Finders, MD      . furosemide (LASIX) tablet 40 mg  40 mg Oral Daily Zada Finders, MD      . heparin injection 5,000 Units  5,000 Units Subcutaneous Q8H Zada Finders, MD      . hydrALAZINE (APRESOLINE) tablet 25 mg  25 mg Oral TID Zada Finders, MD      . HYDROcodone-acetaminophen (NORCO/VICODIN) 5-325 MG per tablet 1 tablet  1 tablet Oral Q6H PRN Zada Finders, MD      . pravastatin (PRAVACHOL) tablet 40 mg  40 mg Oral QPM Zada Finders, MD      . Derrill Memo ON 03/30/2017] rilpivirine (EDURANT) tablet 25 mg  25 mg Oral Q breakfast Zada Finders, MD      . sodium chloride flush (NS) 0.9 % injection 3 mL  3 mL Intravenous Q12H Zada Finders, MD      . tenofovir [facility]oles) tablet 300 mg  300 mg Oral Weekly Zada Finders, MD       Current Outpatient Prescriptions  Medication Sig Dispense Refill  . acetaminophen (TYLENOL) 500 MG tablet Take 1,000 mg by mouth every 6 (six) hours as needed for mild pain.    Marland Kitchen albuterol  (PROVENTIL HFA;VENTOLIN HFA) 108 (90 Base) MCG/ACT inhaler Inhale 2 puffs into the lungs every 6 (six) hours as needed for wheezing or shortness of breath. 1 Inhaler 2  . albuterol (PROVENTIL) (2.5 MG/3ML) 0.083% nebulizer solution Take 3 mLs (2.5 mg total) by nebulization every 6 (six) hours as needed for wheezing or shortness of breath. 75 mL 12  . amLODipine (NORVASC) 5 MG tablet Take 10 mg by mouth daily.  1  . buPROPion (WELLBUTRIN) 75 MG tablet TAKE 1/2 TABLET TWICE A DAY 60 tablet 1  . carvedilol (COREG) 25 MG tablet TAKE 1 TABLET (25 MG TOTAL) BY MOUTH 2 (TWO) TIMES DAILY WITH A MEAL. (Patient taking differently: Take 25 mg by mouth 2 (two) times daily with a meal. ) 180 tablet 1  . doxazosin (CARDURA) 8 MG tablet TAKE 1 TABLET (8 MG TOTAL) BY MOUTH AT BEDTIME. (Patient taking differently: Take 8 mg by mouth at bedtime. ) 90 tablet 1  . EDURANT 25 MG TABS tablet TAKE 1 TABLET (25 MG TOTAL) BY MOUTH DAILY WITH BREAKFAST. 30 tablet 10  . Febuxostat (ULORIC) 80 MG TABS Take 80 mg by mouth daily as needed (gout).     . ferric citrate (AURYXIA) 1 GM 210 MG(Fe) tablet Take 420 mg by mouth 2 (two) times daily with a meal.     . furosemide (LASIX) 40 MG tablet Take 1 tablet (40 mg total) by mouth daily. 90 tablet 1  . hydrALAZINE (APRESOLINE) 25 MG tablet Take 1 tablet (25 mg total) by mouth 3 (three) times daily. 180 tablet 1  . HYDROcodone-acetaminophen (NORCO) 5-325 MG tablet Take 1 tablet  by mouth every 6 (six) hours as needed for moderate pain or severe pain. 20 tablet 0  . montelukast (SINGULAIR) 10 MG tablet Take 1 tablet (10 mg total) by mouth at bedtime. 30 tablet 2  . pravastatin (PRAVACHOL) 40 MG tablet TAKE 1 TABLET (40 MG TOTAL) BY MOUTH EVERY EVENING. 90 tablet 3  . TIVICAY 50 MG tablet TAKE 1 TABLET (50 MG TOTAL) BY MOUTH DAILY. 30 tablet 10  . VIREAD 300 MG tablet TAKE 1 TABLET BY MOUTH ONCE A WEEK (Patient taking differently: TAKE 1 TABLET BY MOUTH ONCE A WEEK ON SUNDAYS) 4 tablet 10    Labs: Basic Metabolic Panel:  Recent Labs Lab 03/29/17 0637  NA 139  K 4.6  CL 102  CO2 24  GLUCOSE 154*  BUN 35*  CREATININE 10.23*  CALCIUM 8.9   Liver Function Tests: No results for input(s): AST, ALT, ALKPHOS, BILITOT, PROT, ALBUMIN in the last 168 hours. No results for input(s): LIPASE, AMYLASE in the last 168 hours. No results for input(s): AMMONIA in the last 168 hours. CBC:  Recent Labs Lab 03/29/17 0637  WBC 17.7*  HGB 9.0*  HCT 29.7*  MCV 94.3  PLT 268   Cardiac Enzymes:  Recent Labs Lab 03/29/17 0637  TROPONINI <0.03   CBG: No results for input(s): GLUCAP in the last 168 hours. Iron Studies: No results for input(s): IRON, TIBC, TRANSFERRIN, FERRITIN in the last 72 hours. Studies/Results: Dg Chest Portable 1 View  Result Date: 03/29/2017 CLINICAL DATA:  Shortness of breath, hypoxia.  Hypertensive. EXAM: PORTABLE CHEST 1 VIEW COMPARISON:  Chest radiograph March 17, 2017 FINDINGS: Cardiac silhouette is mildly enlarged. Pulmonary vascular congestion, interstitial prominence and patchy alveolar airspace opacities. Small RIGHT greater than LEFT pleural effusions. No pneumothorax. Soft tissue planes and included osseous structures are nonacute. Old RIGHT rib fractures. IMPRESSION: Cardiomegaly, interstitial and alveolar edema. Small pleural effusions. Electronically Signed   By: Elon Alas M.D.   On: 03/29/2017 06:51    ROS: As per HPI otherwise negative.   Physical Exam: Vitals:   03/29/17 0700 03/29/17 0815 03/29/17 0930 03/29/17 1045  BP: (!) 157/88 130/81 (!) 145/70 128/77  Pulse: 86 74 68 63  Resp:  20 16 13   Temp:      TempSrc:      SpO2: 100% 100% 100% 100%     General: Well developed, well nourished, in no acute distress. Head: Normocephalic, atraumatic, sclera non-icteric, mucus membranes are moist Neck: Supple. JVD halfway to mandible at 30 degrees.  Lungs: Bilateral breath sounds with scattered rales 1/2 posteriorly, few  scattered expiratory wheezes. On BIPAP, no WOB at present Heart: HS distant.RRR with S1 S2. No murmurs, rubs, or gallops appreciated. Abdomen: Soft, non-tender, non-distended with normoactive bowel sounds. No rebound/guarding. No obvious abdominal masses. M-S:  Strength and tone appear normal for age. Lower extremities:without edema or ischemic changes, no open wounds  Neuro: Unable to assess-on bipap, minimally responsive-not following commands, does open eyes to verbal stimuli and closes them again.  Psych:  Unable to assess-on BIPAP Dialysis Access: RFA AVF + bruit (recent PTA 2 x 81% inflow cephalic vein stenosis treated to 10% with an 8x4 [employer] angioplasty balloon at [facility] per Dr. Posey Pronto 03/26/17)   Dialysis Orders: [facility] MWF 4 hrs 15 min 200 NRe  450/800 112 kg  2.0 K/ 2.0 Ca bath  -Heparin 6000 units IV initial bolus 3000 units IV mid run TIW -Hectorol 7 mcg IV TIW (Last PTH 528 03/13/17) -Mircera 225 mcg  IV q 2 weeks (last dose 03/23/17 Last HGB 8.1 03/25/17) -Venofer 50 mg IV weekly (last dose 03/25/17 Last Fe 66 Tsat 31 03/13/17)  BMD meds:  Auryxia 210 mg 2 tabs PO TID AC Renvela 800 mg 1 tab PO TID AC  Assessment/Plan: 1.  Acute Hypoxic Respiratory Failure 2/2 pulmonary edema: Urgent HD today and serial hemodialysis for volume removal. On BIPAP. O2 sats stable.  2.  ESRD - MWF . HD today off schedule and serial HD for volume removal.  3.  Hypertension/volume  - BP controlled at present. Amlodipine 10 mg PO q HS, Cardura 8 mg PO q HS , Carvedilol 25 mg PO BID, hydralazine 25 mg PO TID per home med list.  4.  Anemia  - HGB 9.0 recent ESA dose. Follow HGB.  5.  Metabolic bone disease - Cont VDRA, binders when able to eat. Add renal function panel to labs today. Ca 8.9 6.  Nutrition -NPO at present. 7. HIV: meds per primary  Rita H. Owens Shark, NP-C 03/29/2017, 12:14 PM  [place]   Renal Attending: Mr Thorington presented with increased SOB  over several days and in the ED was placed on BiPAP.  He will undergo urgent HD and will need a reduction in his EDW. Justine Cossin C

## 2017-03-29 NOTE — ED Provider Notes (Signed)
[facility] DEPT Provider Note   CSN: [id number] Arrival date & time: 03/29/17  3474     History   Chief Complaint Chief Complaint  Patient presents with  . Shortness of Breath   Level V caveat: Respiratory distress  HPI Darrell Johnson is a 51 y.o. male.  HPI Patient is a 51 year old male who presents the emergency department with severe shortness of breath which began when he awoke.  It's reported that he was short of breath when he woke up and called EMS.  On EMS arrival the patient had oxygen sats of 50% and was minimally responsive.  His blood pressure was initially greater than 259 systolic.  EMS place patient on nonrebreather and brought the patient to the emergency department emergently.  No medications prior to arrival.  Patient was not started on CPAP.  On arrival the patient will squeeze both hands and is leaning forward and drooling   Past Medical History:  Diagnosis Date  . Anemia   . Arthritis   . CHF (congestive heart failure) ([place])   . Chronic kidney disease    stage 3-4 CKD, followed by Dr. Moshe Cipro  . ESRD (end stage renal disease) ([place])    ESRD due to HTN started dialysis Feb 2016  . Gout   . Gout, unspecified 08/13/2009   Qualifier: Diagnosis of  By: Redmond Pulling  MD, Mateo Flow    . H1N1 influenza    March 2016  . History of gout   . History of syphilis   . HIV (human immunodeficiency virus infection) ([place])   . HIV infection ([place]) 1980's   on ART therapy since, followed by ID clinic, complicated  by neuropathy  . HTN (hypertension)   . Hyperlipidemia    hypertrygliceridemia determined ti be secondary to ART therpay  . Hypertension   . HYPERTENSION 05/08/2006  . 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 Pneumonia   . Rib fractures 01/2009  . Seizure disorder ([place])   . Seizures ([place])    last seizure was >5 years ago, pt has family history of seizures  . 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 Syphilis 1997   history of syphilis 1997  . Tobacco abuse     Patient Active Problem List   Diagnosis Date Noted  . Scrotal abscess 03/09/2017  . Trigger finger of right hand 09/11/2016  . Abscess of groin, right 09/04/2016  . Hyperlipidemia 03/31/2016  . Anxiety state 03/11/2016  . Lumbar radiculopathy 01/28/2016  . Seasonal allergies   . Healthcare maintenance 05/03/2015  . Anemia in chronic kidney disease 08/30/2014  . ESRD on dialysis ([place]) 12/27/2013  . Drug noncompliance 11/07/2013  . History of syphilis 11/07/2013  . Arthritis 09/09/2013  . Tobacco abuse 09/07/2013  . Chronic pain disorder 04/28/2013  . HYPERTRIGLYCERIDEMIA 11/01/2009  . Gout 08/13/2009  . ERECTILE DYSFUNCTION 06/08/2008  . HIV disease ([place]) 05/08/2006  . PERIPHERAL NEUROPATHY 05/08/2006  . Essential hypertension 05/08/2006  . SEIZURE DISORDER 05/08/2006    Past Surgical History:  Procedure Laterality Date  . ABSCESS DRAINAGE    . AV FISTULA PLACEMENT Right 12/02/2013   Procedure: ARTERIOVENOUS (AV) FISTULA CREATION;  Surgeon: Rosetta Posner, MD;  Location: [facility];  Service: Vascular;  Laterality: Right;  . INCISION AND DRAINAGE ABSCESS Right 03/09/2017   Procedure: INCISION AND DRAINAGE Right Scrotal Abscess;  Surgeon: Diona Fanti,  Annie Main, MD;  Location: [facility];  Service: Urology;  Laterality: Right;       Home Medications    Prior to Admission medications   Medication Sig Start Date End Date Taking? Authorizing Provider  acetaminophen (TYLENOL) 500 MG tablet Take 1,000 mg by mouth every 6 (six) hours as needed for mild pain.    [provider]  albuterol (PROVENTIL HFA;VENTOLIN HFA) 108 (90 Base) MCG/ACT inhaler Inhale 2 puffs into the lungs every 6 (six) hours as needed for wheezing or shortness of breath. 10/02/16   Jule Ser, DO  albuterol (PROVENTIL) (2.5 MG/3ML) 0.083% nebulizer solution Take 3  mLs (2.5 mg total) by nebulization every 6 (six) hours as needed for wheezing or shortness of breath. 09/04/16   Jule Ser, DO  amLODipine (NORVASC) 5 MG tablet Take 10 mg by mouth daily. 12/06/16   [provider]  buPROPion (WELLBUTRIN) 75 MG tablet TAKE 1/2 TABLET TWICE A DAY 02/10/17   Jule Ser, DO  carvedilol (COREG) 25 MG tablet TAKE 1 TABLET (25 MG TOTAL) BY MOUTH 2 (TWO) TIMES DAILY WITH A MEAL. Patient taking differently: Take 25 mg by mouth 2 (two) times daily with a meal.  09/11/16   Jule Ser, DO  doxazosin (CARDURA) 8 MG tablet TAKE 1 TABLET (8 MG TOTAL) BY MOUTH AT BEDTIME. Patient taking differently: Take 8 mg by mouth at bedtime.  09/11/16   Jule Ser, DO  EDURANT 25 MG TABS tablet TAKE 1 TABLET (25 MG TOTAL) BY MOUTH DAILY WITH BREAKFAST. 12/22/16   Carlyle Basques, MD  Febuxostat (ULORIC) 80 MG TABS Take 80 mg by mouth daily as needed (gout).     [provider]  ferric citrate (AURYXIA) 1 GM 210 MG(Fe) tablet Take 420 mg by mouth 2 (two) times daily with a meal.     [provider]  furosemide (LASIX) 40 MG tablet Take 1 tablet (40 mg total) by mouth daily. 09/11/16   Jule Ser, DO  hydrALAZINE (APRESOLINE) 25 MG tablet Take 1 tablet (25 mg total) by mouth 3 (three) times daily. 03/25/17   Jule Ser, DO  HYDROcodone-acetaminophen (NORCO) 5-325 MG tablet Take 1 tablet by mouth every 6 (six) hours as needed for moderate pain or severe pain. 03/19/17   Santos-Sanchez, Merlene Morse, MD  montelukast (SINGULAIR) 10 MG tablet Take 1 tablet (10 mg total) by mouth at bedtime. 12/17/16   Jule Ser, DO  pravastatin (PRAVACHOL) 40 MG tablet TAKE 1 TABLET (40 MG TOTAL) BY MOUTH EVERY EVENING. 01/27/17 01/27/18  Jule Ser, DO  TIVICAY 50 MG tablet TAKE 1 TABLET (50 MG TOTAL) BY MOUTH DAILY. 12/22/16   Carlyle Basques, MD  VIREAD 300 MG tablet TAKE 1 TABLET BY MOUTH ONCE A WEEK Patient taking differently: TAKE 1 TABLET BY MOUTH ONCE A WEEK ON  SUNDAYS 01/06/17   Carlyle Basques, MD    Family History Family History  Problem Relation Age of Onset  . Cancer Mother   . Hypertension Mother   . COPD Father   . Hypertension Father   . Diabetes Sister   . Hypertension Sister   . Diabetes Brother   . Hypertension Brother   . Stroke Neg Hx     Social History Social History  Substance Use Topics  . Smoking status: Former Smoker    Packs/day: 0.40    Years: 20.00    Start date: 01/10/2016    Quit date: 04/07/2016  . Smokeless tobacco: Never Used  . Alcohol use No  Allergies   Patient has no known allergies.   Review of Systems Review of Systems  Unable to perform ROS: Severe respiratory distress     Physical Exam Updated Vital Signs BP 130/81   Pulse 74   Temp (!) 97.2 F (36.2 C) (Axillary)   Resp 20   SpO2 100%   Physical Exam  Constitutional:  Acute distress  HENT:  Head: Normocephalic and atraumatic.  Eyes: EOM are normal.  Neck: Normal range of motion.  Cardiovascular: Intact distal pulses.   Tachycardic  Pulmonary/Chest: He is in respiratory distress.  Rales throughout.  Decreased breath sounds.  Accessory muscles  Abdominal: Soft. He exhibits no distension. There is no tenderness.  Musculoskeletal: Normal range of motion.  Neurological: He is alert.  Follow simple commands.  Squeezes both hands  Skin: Skin is warm and dry.  Nursing note and vitals reviewed.    ED Treatments / Results  Labs (all labs ordered are listed, but only abnormal results are displayed) Labs Reviewed  CBC - Abnormal; Notable for the following:       Result Value   WBC 17.7 (*)    RBC 3.15 (*)    Hemoglobin 9.0 (*)    HCT 29.7 (*)    RDW 19.9 (*)    All other components within normal limits  BASIC METABOLIC PANEL - Abnormal; Notable for the following:    Glucose, Bld 154 (*)    BUN 35 (*)    Creatinine, Ser 10.23 (*)    GFR calc non Af Amer 5 (*)    GFR calc Af Amer 6 (*)    All other components within  normal limits  TROPONIN I  I-STAT CG4 LACTIC ACID, ED  I-STAT ARTERIAL BLOOD GAS, ED    EKG  EKG Interpretation  Date/Time:  Sunday March 29 2017 06:36:58 EDT Ventricular Rate:  115 PR Interval:    QRS Duration: 101 QT Interval:  335 QTC Calculation: 464 R Axis:   77 Text Interpretation:  Sinus tachycardia LAE, consider biatrial enlargement No significant change was found Confirmed by Jola Schmidt [phone]) on 03/29/2017 8:42:13 AM       Radiology Dg Chest Portable 1 View  Result Date: 03/29/2017 CLINICAL DATA:  Shortness of breath, hypoxia.  Hypertensive. EXAM: PORTABLE CHEST 1 VIEW COMPARISON:  Chest radiograph March 17, 2017 FINDINGS: Cardiac silhouette is mildly enlarged. Pulmonary vascular congestion, interstitial prominence and patchy alveolar airspace opacities. Small RIGHT greater than LEFT pleural effusions. No pneumothorax. Soft tissue planes and included osseous structures are nonacute. Old RIGHT rib fractures. IMPRESSION: Cardiomegaly, interstitial and alveolar edema. Small pleural effusions. Electronically Signed   By: Elon Alas M.D.   On: 03/29/2017 06:51    Procedures .Critical Care Performed by: Jola Schmidt Authorized by: Jola Schmidt     CRITICAL CARE Performed by: Hoy Morn Total critical care time: 40 minutes Critical care time was exclusive of separately billable procedures and treating other patients. Critical care was necessary to treat or prevent imminent or life-threatening deterioration. Critical care was time spent personally by me on the following activities: development of treatment plan with patient and/or surrogate as well as nursing, discussions with consultants, evaluation of patient's response to treatment, examination of patient, obtaining history from patient or surrogate, ordering and performing treatments and interventions, ordering and review of laboratory studies, ordering and review of radiographic studies, pulse oximetry  and re-evaluation of patient's condition.   Medications Ordered in ED Medications  furosemide (LASIX) injection 60 mg (60 mg  Intravenous Given 03/29/17 0655)     Initial Impression / Assessment and Plan / ED Course  I have reviewed the triage vital signs and the nursing notes.  Pertinent labs & imaging results that were available during my care of the patient were reviewed by me and considered in my medical decision making (see chart for details).     Patient respiratory distress on arrival.  Likely flash pulmonary edema.  Systolic blood pressure 144R on arrival.  Patient started on BiPAP.  Labs and chest x-ray pending.  8:45 AM Patient has continued to improve in the emergency department.  Blood gases still pending at this time.  Chest x-rays consistent with pulmonary edema.  His blood pressures are normalizing in the emergency department with his last blood pressure at 8:15 being 130/81.  He seems much better.  He likely can come off of BiPAP same.  He was given IV Lasix.  Patient on admission to the stepdown unit unless he continues to improve at which point he can likely be admitted to the floor.  Patient did undergo dialysis on Friday.  He dialyzes Monday Wednesday Friday.  Final Clinical Impressions(s) / ED Diagnoses   Final diagnoses:  Acute respiratory failure with hypoxia ([place])  Acute pulmonary edema [facility])    New Prescriptions New Prescriptions   No medications on file     Jola Schmidt, MD 03/29/17 [phone]

## 2017-03-30 DIAGNOSIS — G40909 Epilepsy, unspecified, not intractable, without status epilepticus: Secondary | ICD-10-CM | POA: Diagnosis present

## 2017-03-30 DIAGNOSIS — R000 Tachycardia, unspecified: Secondary | ICD-10-CM

## 2017-03-30 DIAGNOSIS — M1990 Unspecified osteoarthritis, unspecified site: Secondary | ICD-10-CM | POA: Diagnosis present

## 2017-03-30 DIAGNOSIS — M898X9 Other specified disorders of bone, unspecified site: Secondary | ICD-10-CM | POA: Diagnosis present

## 2017-03-30 DIAGNOSIS — B20 Human immunodeficiency virus [HIV] disease: Secondary | ICD-10-CM | POA: Diagnosis not present

## 2017-03-30 DIAGNOSIS — Z6834 Body mass index (BMI) 34.0-34.9, adult: Secondary | ICD-10-CM | POA: Diagnosis not present

## 2017-03-30 DIAGNOSIS — F17211 Nicotine dependence, cigarettes, in remission: Secondary | ICD-10-CM

## 2017-03-30 DIAGNOSIS — J810 Acute pulmonary edema: Secondary | ICD-10-CM | POA: Diagnosis not present

## 2017-03-30 DIAGNOSIS — D72829 Elevated white blood cell count, unspecified: Secondary | ICD-10-CM

## 2017-03-30 DIAGNOSIS — Z8249 Family history of ischemic heart disease and other diseases of the circulatory system: Secondary | ICD-10-CM

## 2017-03-30 DIAGNOSIS — Z79899 Other long term (current) drug therapy: Secondary | ICD-10-CM

## 2017-03-30 DIAGNOSIS — I120 Hypertensive chronic kidney disease with stage 5 chronic kidney disease or end stage renal disease: Secondary | ICD-10-CM | POA: Diagnosis not present

## 2017-03-30 DIAGNOSIS — J9601 Acute respiratory failure with hypoxia: Secondary | ICD-10-CM | POA: Diagnosis not present

## 2017-03-30 DIAGNOSIS — R011 Cardiac murmur, unspecified: Secondary | ICD-10-CM

## 2017-03-30 DIAGNOSIS — D539 Nutritional anemia, unspecified: Secondary | ICD-10-CM | POA: Diagnosis not present

## 2017-03-30 DIAGNOSIS — I5023 Acute on chronic systolic (congestive) heart failure: Secondary | ICD-10-CM | POA: Diagnosis present

## 2017-03-30 DIAGNOSIS — E669 Obesity, unspecified: Secondary | ICD-10-CM

## 2017-03-30 DIAGNOSIS — I132 Hypertensive heart and chronic kidney disease with heart failure and with stage 5 chronic kidney disease, or end stage renal disease: Principal | ICD-10-CM

## 2017-03-30 DIAGNOSIS — N186 End stage renal disease: Secondary | ICD-10-CM

## 2017-03-30 DIAGNOSIS — I160 Hypertensive urgency: Secondary | ICD-10-CM

## 2017-03-30 DIAGNOSIS — I5021 Acute systolic (congestive) heart failure: Secondary | ICD-10-CM | POA: Diagnosis not present

## 2017-03-30 DIAGNOSIS — Z992 Dependence on renal dialysis: Secondary | ICD-10-CM | POA: Diagnosis not present

## 2017-03-30 DIAGNOSIS — Z87891 Personal history of nicotine dependence: Secondary | ICD-10-CM | POA: Diagnosis not present

## 2017-03-30 DIAGNOSIS — M109 Gout, unspecified: Secondary | ICD-10-CM

## 2017-03-30 DIAGNOSIS — E785 Hyperlipidemia, unspecified: Secondary | ICD-10-CM

## 2017-03-30 DIAGNOSIS — D631 Anemia in chronic kidney disease: Secondary | ICD-10-CM | POA: Diagnosis present

## 2017-03-30 DIAGNOSIS — Z7902 Long term (current) use of antithrombotics/antiplatelets: Secondary | ICD-10-CM

## 2017-03-30 DIAGNOSIS — Z8709 Personal history of other diseases of the respiratory system: Secondary | ICD-10-CM

## 2017-03-30 DIAGNOSIS — I43 Cardiomyopathy in diseases classified elsewhere: Secondary | ICD-10-CM | POA: Diagnosis present

## 2017-03-30 DIAGNOSIS — Z836 Family history of other diseases of the respiratory system: Secondary | ICD-10-CM

## 2017-03-30 DIAGNOSIS — Z7982 Long term (current) use of aspirin: Secondary | ICD-10-CM

## 2017-03-30 DIAGNOSIS — Z833 Family history of diabetes mellitus: Secondary | ICD-10-CM

## 2017-03-30 DIAGNOSIS — N2581 Secondary hyperparathyroidism of renal origin: Secondary | ICD-10-CM | POA: Diagnosis present

## 2017-03-30 DIAGNOSIS — Z6837 Body mass index (BMI) 37.0-37.9, adult: Secondary | ICD-10-CM | POA: Diagnosis not present

## 2017-03-30 DIAGNOSIS — Z872 Personal history of diseases of the skin and subcutaneous tissue: Secondary | ICD-10-CM

## 2017-03-30 DIAGNOSIS — Z809 Family history of malignant neoplasm, unspecified: Secondary | ICD-10-CM

## 2017-03-30 LAB — RENAL FUNCTION PANEL
Albumin: 2.9 g/dL — ABNORMAL LOW (ref 3.5–5.0)
Anion gap: 10 (ref 5–15)
BUN: 26 mg/dL — AB (ref 6–20)
CHLORIDE: 96 mmol/L — AB (ref 101–111)
CO2: 28 mmol/L (ref 22–32)
CREATININE: 7.2 mg/dL — AB (ref 0.61–1.24)
Calcium: 8.6 mg/dL — ABNORMAL LOW (ref 8.9–10.3)
GFR calc Af Amer: 9 mL/min — ABNORMAL LOW (ref >60)
GFR, EST NON AFRICAN AMERICAN: 8 mL/min — AB (ref >60)
GLUCOSE: 106 mg/dL — AB (ref 65–99)
POTASSIUM: 3.5 mmol/L (ref 3.5–5.1)
Phosphorus: 4.7 mg/dL — ABNORMAL HIGH (ref 2.5–4.6)
Sodium: 134 mmol/L — ABNORMAL LOW (ref 135–145)

## 2017-03-30 LAB — TROPONIN I: Troponin I: 0.03 ng/mL (ref <0.03)

## 2017-03-30 LAB — CBC
HEMATOCRIT: 24.4 % — AB (ref 39.0–52.0)
Hemoglobin: 7.4 g/dL — ABNORMAL LOW (ref 13.0–17.0)
MCH: 28.2 pg (ref 26.0–34.0)
MCHC: 30.3 g/dL (ref 30.0–36.0)
MCV: 93.1 fL (ref 78.0–100.0)
Platelets: 177 10*3/uL (ref 150–400)
RBC: 2.62 MIL/uL — AB (ref 4.22–5.81)
RDW: 19.8 % — AB (ref 11.5–15.5)
WBC: 6.1 10*3/uL (ref 4.0–10.5)

## 2017-03-30 MED ORDER — AMLODIPINE BESYLATE 10 MG PO TABS
10.0000 mg | ORAL_TABLET | Freq: Every day | ORAL | Status: DC
  Administered 2017-03-31: 10 mg via ORAL
  Filled 2017-03-30: qty 1

## 2017-03-30 NOTE — Progress Notes (Signed)
   Subjective:  No acute events overnight. Patient resting in bed when seen. He is on 2 L of oxygen by nasal cannula and satting well. Denies shortness of breath or chest pain today. No complaints this morning.  Objective:  Vital signs in last 24 hours: Vitals:   03/29/17 2209 03/30/17 0100 03/30/17 0300 03/30/17 0513  BP: (!) 161/70  (!) 170/82 (!) 160/82  Pulse:      Resp:      Temp:   98.2 F (36.8 C)   TempSrc:   Oral   SpO2:      Weight:  240 lb 8.4 oz (109.1 kg)    Height:  5' 9.02" (1.753 m)     General: Pleasant male, obese, well-developed, in no acute distress Cardiac: regular rate and rhythm, nl S1/S2, no murmurs, rubs or gallops, no JVD noted Pulm: scattered wheezes, no increased work of breathing  Abd: soft, NTND, bowel sounds present Ext: warm and well perfused, no peripheral edema   Assessment/Plan:  Darrell Johnson. Darrell Johnson is a 51 y.o. male with history of well-controlled HIV, ESRD on HD, CHF EF 55% 09/2014, HTN, HLD, chronic anemia, and gout who presents with acute hypoxic respiratory failure in the setting of hypertensive crisis with sBP in the 300s likely secondary to volume overload.  # Acute hypoxic respiratory failure: Off of BiPAP and on 2 L of oxygen by nasal cannula and satting well.  - Nephrology following. Plan for HD session today  - Continue to wean as tolerated - s/p IV Lasix 60 mg. Will continue home PO Lasix 40 mg QD. - On telemetry  - Daily weights   # Leukocytosis: Resolved  # HTN: Blood pressure improving after HD session. Plan for another HD session today.  - Continue home amlodipine 10 mg QD + Coreg 25 mg BID + hydralazine 25 mg TID + doxazosin 8 mg QD  - Consider continuing to doxazosin and adding clonidine for better blood pressure control  # ESRD on HD: HD on MWF. Last HD on 9/7.  - Nephrology following, plan for HD session today  # HFpEF, HDL: Diagnosed 2014. EF 55% on TTE 09/2014. Nuclear stress test normal on 11/2015. Unclear if  patient follows up with cardiology as no notes seen in chart.  - Continue home ASA 81 mg QD + Coreg 25 mg BID  - Continue home PO Lasix 40 mg QD  - Continue home pravastatin 40 mg daily    # Chronic anemia: Baseline Hgb 8-10. On ferric citrate at home.  - Will continue to monitor   # HIV, well-controlled: Diagnosed in 1980s. CD4 600 and viral load 28 on 09/2016. On Tivicay, [place], and Viread. Last saw [name] with ID on 10/01/2016.  - Continue home Tivicay, [place], and Viread   # Gout: currently asymptomatic  - Continue home febuxostat 80 mg daily PRN   F: None  E: Will monitor and replete as needed N: renal diet  VTE ppx: SQ hepatin   Code status: Full code, not confirmed on admission   Dispo: Anticipated discharge in approximately 1-2 day(s).   Welford Roche, MD  Internal Medicine PGY-1  P [phone]

## 2017-03-30 NOTE — Progress Notes (Signed)
Patient requested that RN complete assessment later this evening once his company leaves.

## 2017-03-30 NOTE — Progress Notes (Signed)
Subjective:  Awoken from sleep no cos,tolerated HD yest  /for HD today on schedule/ 4 cups on table  stressed volume compliance   Objective Vital signs in last 24 hours: Vitals:   03/30/17 0100 03/30/17 0300 03/30/17 0513 03/30/17 0825  BP:  (!) 170/82 (!) 160/82 (!) 160/68  Pulse:    86  Resp:    17  Temp:  98.2 F (36.8 C)  98 F (36.7 C)  TempSrc:  Oral  Oral  SpO2:    100%  Weight: 109.1 kg (240 lb 8.4 oz)     Height: 5' 9.02" (1.753 m)      Weight change:   Physical Exam: General: Alert obese AAM NAD OX4 Heart: RRR 2/6 sem lsb LV LIFT, PMI 15 cm lat to MSL Lungs: bibasilar soft rales ,unlabored breathing  Abdomen: obese,soft ,Nt,ND Liver down 6 cm Extremities:Trace bipedal edema  Dialysis Access: pos bruit  LFA AVF    Dialysis Orders: [facility] MWF 4 hrs 15 min 200 NRe  450/800 112 kg  2.0 K/ 2.0 Ca bath  -Heparin 6000 units IV initial bolus 3000 units IV mid run TIW -Hectorol 7 mcg IV TIW (Last PTH 528 03/13/17) -Mircera 225 mcg IV q 2 weeks (last dose 03/23/17 Last HGB 8.1 03/25/17) -Venofer 50 mg IV weekly (last dose 03/25/17 Last Fe 66 Tsat 31 03/13/17)  BMD meds:  Auryxia 210 mg 2 tabs PO TID AC Renvela 800 mg 1 tab PO TID AC  Problem/Plan: 1.  Acute Hypoxic Respiratory Failure 2/2 pulmonary edema: Urgent HD yest 3900 cc uf  / O2 sat 100% now  On [place] O2  Needs uf today  With MWF HD  /LOwer edw at dc/ yest post wt=  109 .1 kg   2.  ESRD - MWF . HD today on  schedule / with ESRD on HD  Will dc lasix  3.  Hypertension/volume  - BP ^l, uf with hd should improve and on  Amlodipine 10 mg PO q HS, Cardura 8 mg PO q HS , Carvedilol 25 mg PO BID, hydralazine 25 mg PO TID per home med list.  Lower with lower vol 4.  Anemia  - HGB 9.0.> 7.4? This am drop  Will reck pre hd / recent max ESA dose. Follow HGB.  5.  Metabolic bone disease - Cont VDRA, binders when able to eat.  6.  Nutrition -NPO at present. 7. HIV: meds per primary  Ernest Haber, PA-C [place] [phone] 03/30/2017,8:51 AM  LOS: 0 days  I have seen and examined this patient and agree with the plan of care  Seen eval, examined, discussed with patient, PA, changes made .  Razan Siler L 03/30/2017, 9:32 AM   Labs: Basic Metabolic Panel:  Recent Labs Lab 03/29/17 0637 03/29/17 1818 03/30/17 0016  NA 139 133* 134*  K 4.6 3.4* 3.5  CL 102 95* 96*  CO2 24 27 28   GLUCOSE 154* 85 106*  BUN 35* 17 26*  CREATININE 10.23* 5.90* 7.20*  CALCIUM 8.9 8.5* 8.6*  PHOS 6.7* 2.7 4.7*   Liver Function Tests:  Recent Labs Lab 03/29/17 0637 03/29/17 1818 03/30/17 0016  ALBUMIN 3.4* 3.3* 2.9*  CBC:  Recent Labs Lab 03/29/17 0637 03/30/17 0016  WBC 17.7* 6.1  HGB 9.0* 7.4*  HCT 29.7* 24.4*  MCV 94.3 93.1  PLT 268 177   Cardiac Enzymes:  Recent Labs Lab 03/29/17 0637 03/29/17 1818 03/30/17 0016  TROPONINI <0.03 0.03* 0.03*   CBG:  No results for input(s): GLUCAP in the last 168 hours.  Studies/Results: Dg Chest Portable 1 View  Result Date: 03/29/2017 CLINICAL DATA:  Shortness of breath, hypoxia.  Hypertensive. EXAM: PORTABLE CHEST 1 VIEW COMPARISON:  Chest radiograph March 17, 2017 FINDINGS: Cardiac silhouette is mildly enlarged. Pulmonary vascular congestion, interstitial prominence and patchy alveolar airspace opacities. Small RIGHT greater than LEFT pleural effusions. No pneumothorax. Soft tissue planes and included osseous structures are nonacute. Old RIGHT rib fractures. IMPRESSION: Cardiomegaly, interstitial and alveolar edema. Small pleural effusions. Electronically Signed   By: Elon Alas M.D.   On: 03/29/2017 06:51   Medications: . sodium chloride    . sodium chloride     . amLODipine  10 mg Oral Daily  . aspirin EC  81 mg Oral Daily  . buPROPion  37.5 mg Oral BID  . carvedilol  25 mg Oral BID WC  . dolutegravir  50 mg Oral Daily  . doxazosin  8 mg Oral QHS  . doxercalciferol  7 mcg Intravenous Q M,W,F-HD  . ferric citrate   420 mg Oral TID WC  . furosemide  40 mg Oral Daily  . heparin  5,000 Units Subcutaneous Q8H  . heparin  6,000 Units Dialysis Once in dialysis  . hydrALAZINE  25 mg Oral TID  . pravastatin  40 mg Oral QPM  . ramelteon  8 mg Oral QHS  . rilpivirine  25 mg Oral Q breakfast  . sevelamer carbonate  800 mg Oral TID WC  . sodium chloride flush  3 mL Intravenous Q12H  . tenofovir  300 mg Oral Weekly

## 2017-03-30 NOTE — Consult Note (Signed)
[facility] Nurse wound consult note Reason for Consult: surgical wound, patient seen by this [facility] nurse just prior to his DC after surgery. Today I saw patient and her reports that he just had a follow up appointment with his urologist last week. At which time he was instructed to stop packing wound, and cover with dry dressing. Wound type: surgical  Pressure Injury POA: NA Measurement: did not assess since patient instructed to stop packing and had postop surgical dressing appointment last week Dressing procedure/placement/frequency: Clean area with soap and water, pat dry. Cover with dry dressing. Secure with mesh under wear. Change daily.  Discussed POC with patient and bedside nurse.  Re consult if needed, will not follow at this time. Thanks  Barbra Miner [employer], RN,CWOCN, CNS, [place] [phone])

## 2017-03-30 NOTE — Procedures (Signed)
I was present at this session.  I have reviewed the session itself and made appropriate changes.  bp ^, 2nd d of HD for vol xs.   Darrell Johnson L 9/10/20182:26 PM

## 2017-03-31 LAB — MRSA PCR SCREENING: MRSA BY PCR: NEGATIVE

## 2017-03-31 NOTE — Discharge Instructions (Addendum)
You were admitted to [facility] for shortness of breath and high blood pressure.   You received dialysis in the hospital with improvement in shortness of breath and blood pressure.   Continue to take your blood pressure medicine at home and try to avoid excessive water intake at home.   Please stop taking your home Lasix. Ask your kidney doctor when or if they want you to restart taking it.   You have a hospital follow up appointment scheduled for Monday, September 17th at 9:15am.

## 2017-03-31 NOTE — Progress Notes (Signed)
   Subjective:  No acute events overnight. Patient is now off of oxygen by nasal cannula and satting well on room air. Denies chest pain, palpitation, and shortness of breath. No complains this morning. No plans for further HD per nephrology.   Objective:  Vital signs in last 24 hours: Vitals:   03/30/17 1900 03/30/17 2051 03/31/17 0053 03/31/17 0502  BP: (!) 164/68 (!) 177/73 (!) 165/84 (!) 164/100  Pulse: 87 94 82 90  Resp: 20 19 (!) 21 20  Temp: 98.6 F (37 C) 99.6 F (37.6 C) 99.5 F (37.5 C) 99.2 F (37.3 C)  TempSrc: Oral Oral Oral Oral  SpO2: 100% 96% 99% 98%  Weight: 235 lb 0.2 oz (106.6 kg)   236 lb (107 kg)  Height:       General: Pleasant male, obese, well-developed, in no acute distress Cardiac: regular rate and rhythm, nl S1/S2, no murmurs, rubs or gallops, no JVD noted Pulm: coarse breath sounds at the bases with scattered expiratory wheezes, no increased work of breathing  Abd: soft, NTND, bowel sounds present Ext: warm and well perfused, no peripheral edema   Assessment/Plan:  Darrell Johnson is a 51 y.o. male with history of well-controlled HIV, ESRD on HD, CHF EF 55% 09/2014, HTN, HLD, chronic anemia, and gout who presents with acute hypoxic respiratory failure in the setting of hypertensive crisis with sBP in the 300s secondary to volume overload.  # Acute hypoxic respiratory failure: Stable. Satting well on room air.  - Nephrology following. No plans for HD today.  - Holding home Lasix per nephrology recs  - On telemetry  - Daily weights   # HTN: Blood pressure continues to improve with HD.  - Continue home amlodipine 10 mg QD + Coreg 25 mg BID + hydralazine 25 mg TID + doxazosin 8 mg QD  - Per nephrology, improvement in volume status will improve BP control. Will not add an antihypertensive to his regimen.   # ESRD on HD: HD on MWF. Last HD on 9/7.  - Nephrology following   # HFpEF, HDL: Diagnosed 2014. EF 55% on TTE 09/2014. Nuclear stress test  normal on 11/2015. Unclear if patient follows up with cardiology as no notes seen in chart.  - Continue home ASA 81 mg QD + Coreg 25 mg BID  - Continue home PO Lasix 40 mg QD  - Continue home pravastatin 40 mg daily    # Chronic anemia: Baseline Hgb 8-10. On ferric citrate at home.  - Will continue to monitor   # HIV, well-controlled: Diagnosed in 1980s. CD4 600 and viral load 28 on 09/2016. On Tivicay, [place], and Viread. Last saw [name] with ID on 10/01/2016.  - Continue home Tivicay, [place], and Viread   # Gout: currently asymptomatic  - Continue home febuxostat 80 mg daily PRN   F: None  E: Will monitor and replete as needed N: renal diet  VTE ppx: SQ hepatin   Code status: Full code, not confirmed on admission   Dispo: Anticipated discharge in approximately today-1 day(s).   Welford Roche, MD  Internal Medicine PGY-1  P [phone]

## 2017-03-31 NOTE — Progress Notes (Signed)
Medicine attending discharge note: I personally examined this patient on the day of discharge and I attest to the accuracy of the discharge evaluation and plan as recorded in the final progress note by resident physician Dr. Isac Sarna.    51 year old man with end-stage renal disease on dialysis and HIV controlled on current regimen who presents with 1 of multiple episodes of recurrent hypoxic respiratory failure with acute "flash" pulmonary edema despite compliance with his medications and dialysis program.  There was no evidence for acute myocardial injury.  No signs of active infection.  Initial blood pressure over 701 mm systolic.  He was followed closely by nephrology.  They felt that this was likely a volume issue and intensified his dialysis to a new target dry weight.  His symptoms resolved promptly.  Weight 249 pounds on admission.  107 kg, 236 pounds at discharge.  Blood pressure 164/100.  No changes in his anti-hypertensive regimen were recommended.  It is felt that his blood pressure will stabilize at a lower rate continuing his current medical regimen.  Disposition: Condition stable at time of discharge Follow-up in our medical clinic and with nephrology There were no complications

## 2017-03-31 NOTE — Progress Notes (Signed)
Subjective:  No cos / hd  yest  On schedule "sl cramps end  "  Objective Vital signs in last 24 hours: Vitals:   03/30/17 1900 03/30/17 2051 03/31/17 0053 03/31/17 0502  BP: (!) 164/68 (!) 177/73 (!) 165/84 (!) 164/100  Pulse: 87 94 82 90  Resp: 20 19 (!) 21 20  Temp: 98.6 F (37 C) 99.6 F (37.6 C) 99.5 F (37.5 C) 99.2 F (37.3 C)  TempSrc: Oral Oral Oral Oral  SpO2: 100% 96% 99% 98%  Weight: 106.6 kg (235 lb 0.2 oz)   107 kg (236 lb)  Height:       Weight change: -1.8 kg (-3 lb 15.5 oz)  Physical Exam: General: Alert obese AAM NAD OX4 Heart: RRR 2/6 sem lsb LV LIFT, PMI 15 cm lat to MSL Lungs: CTA  ,unlabored breathing  Abdomen: obese,soft ,Nt,ND Liver down 6 cm Extremities: no pedal  edema  Dialysis Access: pos bruit  LFA AVF    Dialysis Orders: [facility] MWF 4 hrs 15 min 200 NRe  450/800 112 kg  2.0 K/ 2.0 Ca bath  -Heparin 6000 units IV initial bolus 3000 units IV mid run TIW -Hectorol 7 mcg IV TIW (Last PTH 528 03/13/17) -Mircera 225 mcg IV q 2 weeks (last dose 03/23/17 Last HGB 8.1 03/25/17) -Venofer 50 mg IV weekly (last dose 03/25/17 Last Fe 66 Tsat 31 03/13/17)  BMD meds:  Auryxia 210 mg 2 tabs PO TID AC Renvela 800 mg 1 tab PO TID AC  Problem/Plan: 1. Acute Hypoxic Respiratory Failure 2/2 pulmonary edema:  With MWF HD  /LOwer edw at dc/ yest post wt=  106.6  kg  Ok for [employer] notify [facility] lower edw  2. ESRD - MWF .  on  schedule  k 3.5  Fu as op to adjust  k bath  3. Hypertension/volume -  Admit BP Carlean Jews, uf with hd should improve and on  Amlodipine 10 mg PO q HS, Cardura 8 mg PO q HS , Carvedilol 25 mg PO BID, hydralazine 25 mg PO TID per home med list.  Lower with lower vol 4. Anemia - HGB 9.0.> 7.4  recent max ESA dose. Follow HGB.  5. Metabolic bone disease - Cont VDRA, binder 6. HIV: meds per primary  Ernest Haber, PA-C [place] [phone] 03/31/2017,8:49 AM  LOS: 1 day   I have seen and examined this patient and  agree with the plan of care seen, eval, examined, discussed with PA, pharmacy.  Counseled on vol, wgts, HD, and will contact Unit to slowly lower dry .  Ladarrius Bogdanski L 03/31/2017, 1:01 PM   Labs: Basic Metabolic Panel:  Recent Labs Lab 03/29/17 0637 03/29/17 1818 03/30/17 0016  NA 139 133* 134*  K 4.6 3.4* 3.5  CL 102 95* 96*  CO2 24 27 28   GLUCOSE 154* 85 106*  BUN 35* 17 26*  CREATININE 10.23* 5.90* 7.20*  CALCIUM 8.9 8.5* 8.6*  PHOS 6.7* 2.7 4.7*   Liver Function Tests:  Recent Labs Lab 03/29/17 0637 03/29/17 1818 03/30/17 0016  ALBUMIN 3.4* 3.3* 2.9*   No results for input(s): LIPASE, AMYLASE in the last 168 hours. No results for input(s): AMMONIA in the last 168 hours. CBC:  Recent Labs Lab 03/29/17 0637 03/30/17 0016  WBC 17.7* 6.1  HGB 9.0* 7.4*  HCT 29.7* 24.4*  MCV 94.3 93.1  PLT 268 177   Cardiac Enzymes:  Recent Labs Lab 03/29/17 0637 03/29/17 1818 03/30/17 0016  TROPONINI <0.03 0.03* 0.03*   CBG: No results for input(s): GLUCAP in the last 168 hours.  Studies/Results: No results found. Medications:  . amLODipine  10 mg Oral QHS  . aspirin EC  81 mg Oral Daily  . buPROPion  37.5 mg Oral BID  . carvedilol  25 mg Oral BID WC  . dolutegravir  50 mg Oral Daily  . doxazosin  8 mg Oral QHS  . doxercalciferol  7 mcg Intravenous Q M,W,F-HD  . ferric citrate  420 mg Oral TID WC  . heparin  5,000 Units Subcutaneous Q8H  . hydrALAZINE  25 mg Oral TID  . pravastatin  40 mg Oral QPM  . ramelteon  8 mg Oral QHS  . rilpivirine  25 mg Oral Q breakfast  . sodium chloride flush  3 mL Intravenous Q12H  . tenofovir  300 mg Oral Weekly

## 2017-03-31 NOTE — Discharge Summary (Signed)
Name: Darrell Johnson MRN: [id number] DOB: September 02, 1965 51 y.o. PCP: Jule Ser, DO  Date of Admission: 03/29/2017  6:29 AM Date of Discharge: 03/31/2017 Attending Physician: Dr. Beryle Beams   Discharge Diagnosis: 1. Acute hypoxic respiratory failure   Principal Problem:   Acute respiratory failure with hypoxia (HCC) Active Problems:   HIV disease ([place])   Essential hypertension   ESRD on dialysis [facility])   Discharge Medications: Allergies as of 03/31/2017   No Known Allergies     Medication List    STOP taking these medications   furosemide 40 MG tablet Commonly known as:  LASIX   HYDROcodone-acetaminophen 5-325 MG tablet Commonly known as:  NORCO     TAKE these medications   acetaminophen 500 MG tablet Commonly known as:  TYLENOL Take 1,000 mg by mouth every 6 (six) hours as needed for mild pain.   albuterol (2.5 MG/3ML) 0.083% nebulizer solution Commonly known as:  PROVENTIL Take 3 mLs (2.5 mg total) by nebulization every 6 (six) hours as needed for wheezing or shortness of breath.   albuterol 108 (90 Base) MCG/ACT inhaler Commonly known as:  PROVENTIL HFA;VENTOLIN HFA Inhale 2 puffs into the lungs every 6 (six) hours as needed for wheezing or shortness of breath.   amLODipine 5 MG tablet Commonly known as:  NORVASC Take 10 mg by mouth daily.   buPROPion 75 MG tablet Commonly known as:  WELLBUTRIN TAKE 1/2 TABLET TWICE A DAY   carvedilol 25 MG tablet Commonly known as:  COREG TAKE 1 TABLET (25 MG TOTAL) BY MOUTH 2 (TWO) TIMES DAILY WITH A MEAL. What changed:  how much to take  how to take this  when to take this  additional instructions   doxazosin 8 MG tablet Commonly known as:  CARDURA TAKE 1 TABLET (8 MG TOTAL) BY MOUTH AT BEDTIME. What changed:  how much to take  how to take this  when to take this  additional instructions   EDURANT 25 MG Tabs tablet Generic drug:  rilpivirine TAKE 1 TABLET (25 MG TOTAL) BY MOUTH DAILY WITH  BREAKFAST.   ferric citrate 1 GM 210 MG(Fe) tablet Commonly known as:  AURYXIA Take 420 mg by mouth 2 (two) times daily with a meal.   hydrALAZINE 25 MG tablet Commonly known as:  APRESOLINE Take 1 tablet (25 mg total) by mouth 3 (three) times daily.   montelukast 10 MG tablet Commonly known as:  SINGULAIR Take 1 tablet (10 mg total) by mouth at bedtime.   pravastatin 40 MG tablet Commonly known as:  PRAVACHOL TAKE 1 TABLET (40 MG TOTAL) BY MOUTH EVERY EVENING.   TIVICAY 50 MG tablet Generic drug:  dolutegravir TAKE 1 TABLET (50 MG TOTAL) BY MOUTH DAILY.   VIREAD 300 MG tablet Generic drug:  tenofovir TAKE 1 TABLET BY MOUTH ONCE A WEEK What changed:  See the new instructions.            Discharge Care Instructions        Start     Ordered   03/31/17 0000  Increase activity slowly     03/31/17 1143   03/31/17 0000  Diet - low sodium heart healthy     03/31/17 1143   03/31/17 0000  Call MD for:  difficulty breathing, headache or visual disturbances     03/31/17 1143      Disposition and follow-up:   Mr.Blas D Ferencz was discharged from [facility] in Stable condition.  At the hospital  follow up visit please address:  1.  Please assess for symptoms of volume overload and compliance with home blood pressure medications.   2.  Labs / imaging needed at time of follow-up: None   3.  Pending labs/ test needing follow-up: None   Follow-up Appointments: Follow-up Information    Jule Ser, DO Follow up.   Specialty:  Internal Medicine Why:  You have a follow up appointment scheduled for Monday, September 17th at 9:15am  Contact information: [place] [phone] [place] Hospital Course by problem list:   Darrell Johnson is a 51 y.o. male with history of well-controlled HIV, ESRD on HD, HFpEF 55% 09/2014, HTN, HLD, chronic anemia, and gout who presents with acute hypoxic respiratory failure in the  setting of hypertensive crisis with sBP in the 300s secondary to volume overload.  1. Acute hypoxic respiratory failure: Patient presented with acute onset dyspnea and found to be hypoxic in the 50's and hypertensive with sBP in the 300s. He was initially placed on BiPAP and weaned to nasal cannula overnight. He was dialysed twice during admission with significant improvement in respiratory status and blood pressure. He was euvolemic on exam satting well on room air on day of discharge.   2. Hypertension: Patient's blood pressure improved with HD, but remained elevated with sBP 160-170s. Nephrology did not recommend adjustments to his antihypertensive medications.   Patient was continued on home medications for his chronic medical problems.   Discharge Vitals:   BP (!) 164/100 (BP Location: Left Arm)   Pulse 90   Temp 98.6 F (37 C) (Oral)   Resp 20   Ht 5' 9.02" (1.753 m)   Wt 236 lb (107 kg)   SpO2 98%   BMI 34.83 kg/m   Pertinent Labs, Studies, and Procedures:   CXR 9/9: FINDINGS: Cardiac silhouette is mildly enlarged. Pulmonary vascular congestion, interstitial prominence and patchy alveolar airspace opacities. Small RIGHT greater than LEFT pleural effusions. No pneumothorax. Soft tissue planes and included osseous structures are nonacute. Old RIGHT rib fractures  Discharge Instructions: Discharge Instructions    Call MD for:  difficulty breathing, headache or visual disturbances    Complete by:  As directed    Diet - low sodium heart healthy    Complete by:  As directed    Increase activity slowly    Complete by:  As directed       Signed: Welford Roche, MD  Internal Medicine PGY-1  P [phone]

## 2017-03-31 NOTE — Plan of Care (Signed)
Problem: Skin Integrity: Goal: Risk for impaired skin integrity will decrease Outcome: Progressing Last Braden score per this RN assessment 21.

## 2017-04-01 ENCOUNTER — Other Ambulatory Visit: Payer: Self-pay | Admitting: Pharmacist

## 2017-04-01 DIAGNOSIS — N186 End stage renal disease: Secondary | ICD-10-CM | POA: Diagnosis not present

## 2017-04-01 DIAGNOSIS — Z23 Encounter for immunization: Secondary | ICD-10-CM | POA: Diagnosis not present

## 2017-04-01 DIAGNOSIS — D631 Anemia in chronic kidney disease: Secondary | ICD-10-CM | POA: Diagnosis not present

## 2017-04-01 DIAGNOSIS — J810 Acute pulmonary edema: Secondary | ICD-10-CM | POA: Diagnosis not present

## 2017-04-01 DIAGNOSIS — N2581 Secondary hyperparathyroidism of renal origin: Secondary | ICD-10-CM | POA: Diagnosis not present

## 2017-04-01 NOTE — Patient Outreach (Addendum)
[facility]) Care Management  04/01/2017  Darrell Johnson 21-Sep-1965 [id number]  Patient was called to follow up on his need for a pill box as well as to review his medications post discharge. Unfortunately, when the patient's number was called, a recording came on that stated the number dialed "un available" and did not have an option to leave a message.   Plan:  Call patient back in 3-5 business days.   Elayne Guerin, PharmD, [place] Clinical Pharmacist [phone]

## 2017-04-02 ENCOUNTER — Other Ambulatory Visit: Payer: Self-pay

## 2017-04-03 DIAGNOSIS — D631 Anemia in chronic kidney disease: Secondary | ICD-10-CM | POA: Diagnosis not present

## 2017-04-03 DIAGNOSIS — Z23 Encounter for immunization: Secondary | ICD-10-CM | POA: Diagnosis not present

## 2017-04-03 DIAGNOSIS — N2581 Secondary hyperparathyroidism of renal origin: Secondary | ICD-10-CM | POA: Diagnosis not present

## 2017-04-03 DIAGNOSIS — N186 End stage renal disease: Secondary | ICD-10-CM | POA: Diagnosis not present

## 2017-04-03 DIAGNOSIS — J810 Acute pulmonary edema: Secondary | ICD-10-CM | POA: Diagnosis not present

## 2017-04-04 ENCOUNTER — Other Ambulatory Visit: Payer: Self-pay | Admitting: Internal Medicine

## 2017-04-04 DIAGNOSIS — I10 Essential (primary) hypertension: Secondary | ICD-10-CM

## 2017-04-06 ENCOUNTER — Ambulatory Visit (INDEPENDENT_AMBULATORY_CARE_PROVIDER_SITE_OTHER): Payer: Medicare Other | Admitting: Internal Medicine

## 2017-04-06 ENCOUNTER — Ambulatory Visit: Payer: Self-pay

## 2017-04-06 VITALS — BP 173/86 | HR 83 | Temp 99.1°F | Ht 69.0 in | Wt 240.9 lb

## 2017-04-06 DIAGNOSIS — I509 Heart failure, unspecified: Secondary | ICD-10-CM | POA: Diagnosis not present

## 2017-04-06 DIAGNOSIS — N186 End stage renal disease: Secondary | ICD-10-CM

## 2017-04-06 DIAGNOSIS — I132 Hypertensive heart and chronic kidney disease with heart failure and with stage 5 chronic kidney disease, or end stage renal disease: Secondary | ICD-10-CM

## 2017-04-06 DIAGNOSIS — I10 Essential (primary) hypertension: Secondary | ICD-10-CM

## 2017-04-06 DIAGNOSIS — Z23 Encounter for immunization: Secondary | ICD-10-CM | POA: Diagnosis not present

## 2017-04-06 DIAGNOSIS — J810 Acute pulmonary edema: Secondary | ICD-10-CM | POA: Diagnosis not present

## 2017-04-06 DIAGNOSIS — D631 Anemia in chronic kidney disease: Secondary | ICD-10-CM | POA: Diagnosis not present

## 2017-04-06 DIAGNOSIS — N2581 Secondary hyperparathyroidism of renal origin: Secondary | ICD-10-CM | POA: Diagnosis not present

## 2017-04-06 DIAGNOSIS — Z992 Dependence on renal dialysis: Secondary | ICD-10-CM | POA: Diagnosis not present

## 2017-04-06 MED ORDER — DOXAZOSIN MESYLATE 8 MG PO TABS: 8.0000 mg | ORAL_TABLET | Freq: Every day | ORAL | 3 refills | Status: DC

## 2017-04-06 MED ORDER — AMLODIPINE BESYLATE 5 MG PO TABS: 10.0000 mg | ORAL_TABLET | Freq: Every day | ORAL | 1 refills | Status: DC

## 2017-04-06 MED ORDER — CARVEDILOL 25 MG PO TABS: 25.0000 mg | ORAL_TABLET | Freq: Two times a day (BID) | ORAL | 3 refills | Status: DC

## 2017-04-06 MED ORDER — ALBUTEROL SULFATE HFA 108 (90 BASE) MCG/ACT IN AERS: 2.0000 | INHALATION_SPRAY | Freq: Four times a day (QID) | RESPIRATORY_TRACT | 2 refills | Status: DC | PRN

## 2017-04-06 MED ORDER — HYDRALAZINE HCL 25 MG PO TABS: 25.0000 mg | ORAL_TABLET | Freq: Three times a day (TID) | ORAL | 1 refills | Status: DC

## 2017-04-06 MED ORDER — FUROSEMIDE 40 MG PO TABS: 40.0000 mg | ORAL_TABLET | ORAL | 0 refills | Status: DC | PRN

## 2017-04-06 NOTE — Progress Notes (Signed)
   CC: Hypertension  HPI:  Darrell Johnson is a 51 y.o. with past medical history as documented below presenting for follow up after hospitalization, as well as for hypertension. The patient was admitted on 03/29/2017 for acute hypoxic respiratory secondary to flash pulmonary edema in the setting of hypertensive emergency. Since discharge, the patient feels well and denies shortness of breath or chest pain. He had run out of his blood pressure medications and had not taken them in a couple of days. He attended dialysis this morning, and tolerated it well. He has no other complaints at this time.   Past Medical History:  Diagnosis Date  . Anemia   . Arthritis   . CHF (congestive heart failure) ([place])   . Chronic kidney disease    stage 3-4 CKD, followed by Dr. Moshe Cipro  . ESRD (end stage renal disease) ([place])    ESRD due to HTN started dialysis Feb 2016  . Gout   . Gout, unspecified 08/13/2009   Qualifier: Diagnosis of  By: Redmond Pulling  MD, Mateo Flow    . H1N1 influenza    March 2016  . History of gout   . History of syphilis   . HIV (human immunodeficiency virus infection) ([place])   . HIV infection ([place]) 1980's   on ART therapy since, followed by ID clinic, complicated  by neuropathy  . HTN (hypertension)   . Hyperlipidemia    hypertrygliceridemia determined ti be secondary to ART therpay  . Hypertension   . HYPERTENSION 05/08/2006  . 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 Pneumonia   . Rib fractures 01/2009  . Seizure disorder ([place])   . Seizures ([place])    last seizure was >5 years ago, pt has family history of seizures  . 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 Syphilis 1997   history of syphilis 1997  . Tobacco abuse    Review of Systems:   Review of Systems  Constitutional: Negative for chills, fever and malaise/fatigue.  Respiratory:  Negative for cough, shortness of breath and wheezing.   Cardiovascular: Negative for chest pain and leg swelling.  Gastrointestinal: Negative for abdominal pain, constipation, diarrhea, nausea and vomiting.  Genitourinary: Negative.   Musculoskeletal: Negative.   Neurological: Negative.     Physical Exam:  Vitals:   04/06/17 1529  BP: (!) 173/86  Pulse: 83  Temp: 99.1 F (37.3 C)  TempSrc: Oral  SpO2: 99%  Weight: 240 lb 14.4 oz (109.3 kg)  Height: 5\' 9"  (1.753 m)   General: Sitting comfortably, NAD HEENT: [place]/AT, EOMI, no scleral icterus Cardiac: RRR, No R/M/G appreciated Pulm: normal effort, CTAB Abd: soft, non tender, non distended, BS normal Ext: extremities well perfused, no peripheral edema Neuro: alert and oriented X3, cranial nerves II-XII grossly intact   Assessment & Plan:   See Encounters Tab for problem based charting.  Patient seen with Dr. Angelia Mould

## 2017-04-06 NOTE — Assessment & Plan Note (Signed)
Attended HD this AM. Not volume overloaded on physical exam today: lungs CTAB, sating at 99% on RA, no peripheral edema. Weight 240, discharge weight 236 on 03/31/2017.

## 2017-04-06 NOTE — Patient Instructions (Addendum)
Mr. Berg,   Please take your blood pressure medications as prescribed below:   Amlodipine 10 mg daily Coreg 25 mg twice daily Cardura 8 mg daily Hydralazine 25 mg daily  It is very important you take these medications to help prevent further hospitalizations.

## 2017-04-06 NOTE — Assessment & Plan Note (Addendum)
Patient states he is typically compliant with his blood pressure medications, but recently ran out. Current regimen after discharge includes: Amlodipine 10 mg daily, Coreg 25 mg twice daily, Doxazosin 8 mg daily, and Hydralazine 25 mg daily. While hospitalized, Nephrology did not want to change any of his BP medications and instead wanted to intensify his HD and decrease his dry weight. On HD MWF.  BP Readings from Last 3 Encounters:  04/06/17 (!) 173/86  03/31/17 (!) 164/100  03/19/17 (!) 181/92   Plan: -Continue Amlodipine 10 mg daily, Coreg 25 mg twice daily, Cardura 8 mg daily, and Hydralazine 25 mg daily -Will be seeing Nephrologist at [facility], f/u care everywhere for recs

## 2017-04-07 ENCOUNTER — Other Ambulatory Visit: Payer: Self-pay | Admitting: Internal Medicine

## 2017-04-07 DIAGNOSIS — F411 Generalized anxiety disorder: Secondary | ICD-10-CM

## 2017-04-07 NOTE — Progress Notes (Signed)
Internal Medicine Clinic Attending  I saw and evaluated the patient.  I personally confirmed the key portions of the history and exam documented by Dr. LaCroce and I reviewed pertinent patient test results.  The assessment, diagnosis, and plan were formulated together and I agree with the documentation in the resident's note.  

## 2017-04-07 NOTE — Patient Outreach (Signed)
[facility]) Care Management  Late entry for 04/02/2017  Darrell Johnson 1966/07/18 [id number]   RNCM received initial referral on 03/11/2017 from hospital liaison to initiate transition of care. Patient was discharged to home on 03/12/2017 following an inpatient admission 8/20-8/23 for a scrotal abscess. Patient underwent I&D on admission and discharged with IV vancomycin and ceftazidime to be given with dialysis on 03/13/2017. Culture results pending on dc and patient to be called by Saturday with orders for oral antibioic therapy to start taking after dialysis on Monday. RNCM was unable to make contact with patient for [facility] and letter was sent.  Patient then presented to ED on 03/29/2017 with acute onset of dyspnea and was hypoxic in the 50's. He was also hypertensive with SBP in 300's. He had flash pulmonary edema despite compliance with medications and HD. He was initially placed on bipap, but was weaned to nasal cannula overnight. He was dialyzed twice during admission with significant improvements in respiratory status and blood pressure. His SBP came down to 160-170's. Nephrology saw and felt BP issue was likely related to volume issue and intensified dialysis to a new dry weight. No changes were made to his antihypertensive meds. Weight was 249 lbs on admission and was 236 at discharge.   Patient has a past medical history of CHF (ED 55-60% on 09/28/14), ESRD on hemodialysis (Mondays, Wednesdays and Fridays), chronic anemia, well controlled HIV.  Successful outreach complete with patient. Patient identification verified. RNCM provided education about [place] [facility] and role of RNCM and patient was agreeable to services at this time. Patient stated that his last visit to the hospital was because he was having trouble breathing. He stated that while he was there his blood pressure was high in the 300's. He stated that it returned to the 160's before discharge and that is  closer to his normal. He stated that he has a blood pressure cuff but has not been checking his BP because he does not know where his machine is.  He reported that he does not weigh himself at home. He stated that he gets weighed every other day at dialysis and his last weight was 233 lbs (his weight at dc was 236 lbs). He denies any shortness of breath or difficulties since discharge home.  He reported that he is on fluid restrictions and can only have 40 ounces of fluids per day. He stated that he watches his salt intake and portions as well, but is not following a specific diet. He reported that he did not know he had a diagnosis of heart failure when RNCM assessed his understanding of HF and the importance of weighing himself daliy, monitoring salt intake, etc.  Patient reported that his abscess is healing and has a small amount of drainage, but no pus like before. He denies any signs and symptoms of fever, such as pain at site, fever, pus, etc. He stated that it was drained while he was in the hospital the first time and has been doing ok since.  Patient reported that he has been sleeping in a recliner, but not related to inability breathe. He stated that his bedroom is not put together as he is waiting for carpet to be put down.  Patient was agreeable to continued outreach and a home visit, but he stated it would have to be the week after next as he has a lot of appointments between his dialysis visits next week. RNCM provided contact information and encouraged  patient to call with any questions or concerns and patient verbalized understanding. Plan: RNCM to follow patient for transition of care. Home visit scheduled in 2 weeks.  [place] R. Kiley Solimine, RN, BSN, [place] Management Coordinator [phone]

## 2017-04-08 ENCOUNTER — Other Ambulatory Visit: Payer: Self-pay | Admitting: Pharmacist

## 2017-04-08 DIAGNOSIS — N2581 Secondary hyperparathyroidism of renal origin: Secondary | ICD-10-CM | POA: Diagnosis not present

## 2017-04-08 DIAGNOSIS — D631 Anemia in chronic kidney disease: Secondary | ICD-10-CM | POA: Diagnosis not present

## 2017-04-08 DIAGNOSIS — N186 End stage renal disease: Secondary | ICD-10-CM | POA: Diagnosis not present

## 2017-04-08 DIAGNOSIS — Z23 Encounter for immunization: Secondary | ICD-10-CM | POA: Diagnosis not present

## 2017-04-08 DIAGNOSIS — J810 Acute pulmonary edema: Secondary | ICD-10-CM | POA: Diagnosis not present

## 2017-04-08 NOTE — Patient Outreach (Signed)
[facility]) Care Management  04/08/2017  Darrell Johnson 18-Jul-1966 [id number]   Patient was called to follow up on his need for a pill box as well as to review his medications post discharge. Unfortunately, when the patient's number was called, a recording came on that stated the number dialed "un available" and did not have an option to leave a message.   Plan:  Call patient back in 5-7 business days.   Elayne Guerin, PharmD, [place] Clinical Pharmacist [phone]

## 2017-04-09 ENCOUNTER — Other Ambulatory Visit: Payer: Self-pay

## 2017-04-09 ENCOUNTER — Emergency Department (HOSPITAL_COMMUNITY): Payer: Medicare Other

## 2017-04-09 ENCOUNTER — Encounter (HOSPITAL_COMMUNITY): Payer: Self-pay | Admitting: Emergency Medicine

## 2017-04-09 DIAGNOSIS — I161 Hypertensive emergency: Secondary | ICD-10-CM | POA: Diagnosis not present

## 2017-04-09 DIAGNOSIS — I132 Hypertensive heart and chronic kidney disease with heart failure and with stage 5 chronic kidney disease, or end stage renal disease: Secondary | ICD-10-CM

## 2017-04-09 DIAGNOSIS — Z79899 Other long term (current) drug therapy: Secondary | ICD-10-CM | POA: Insufficient documentation

## 2017-04-09 DIAGNOSIS — I5033 Acute on chronic diastolic (congestive) heart failure: Secondary | ICD-10-CM | POA: Diagnosis not present

## 2017-04-09 DIAGNOSIS — J811 Chronic pulmonary edema: Secondary | ICD-10-CM | POA: Diagnosis not present

## 2017-04-09 DIAGNOSIS — Z7902 Long term (current) use of antithrombotics/antiplatelets: Secondary | ICD-10-CM | POA: Insufficient documentation

## 2017-04-09 DIAGNOSIS — Z992 Dependence on renal dialysis: Secondary | ICD-10-CM | POA: Insufficient documentation

## 2017-04-09 DIAGNOSIS — Z87891 Personal history of nicotine dependence: Secondary | ICD-10-CM | POA: Diagnosis not present

## 2017-04-09 DIAGNOSIS — J810 Acute pulmonary edema: Secondary | ICD-10-CM | POA: Insufficient documentation

## 2017-04-09 DIAGNOSIS — Z21 Asymptomatic human immunodeficiency virus [HIV] infection status: Secondary | ICD-10-CM

## 2017-04-09 DIAGNOSIS — N186 End stage renal disease: Secondary | ICD-10-CM | POA: Insufficient documentation

## 2017-04-09 DIAGNOSIS — I509 Heart failure, unspecified: Secondary | ICD-10-CM | POA: Insufficient documentation

## 2017-04-09 DIAGNOSIS — R0600 Dyspnea, unspecified: Secondary | ICD-10-CM | POA: Diagnosis not present

## 2017-04-09 DIAGNOSIS — I120 Hypertensive chronic kidney disease with stage 5 chronic kidney disease or end stage renal disease: Secondary | ICD-10-CM | POA: Diagnosis not present

## 2017-04-09 DIAGNOSIS — D631 Anemia in chronic kidney disease: Secondary | ICD-10-CM | POA: Diagnosis not present

## 2017-04-09 DIAGNOSIS — J9601 Acute respiratory failure with hypoxia: Secondary | ICD-10-CM | POA: Diagnosis not present

## 2017-04-09 DIAGNOSIS — R0602 Shortness of breath: Secondary | ICD-10-CM | POA: Diagnosis not present

## 2017-04-09 NOTE — ED Triage Notes (Signed)
Pt presents to ED with [facility] from home.  States he woke up to SOB, worsening with exertion.  Hx of CHF.  Pt states he is only on Lasix as needed right now.  Pt denies leg swelling. !00% on RA.  NAD.  Denies pain.  Hx of dialysis MWF, patient went yesterday and had his full treatment.

## 2017-04-10 ENCOUNTER — Emergency Department (HOSPITAL_COMMUNITY)
Admission: EM | Admit: 2017-04-10 | Discharge: 2017-04-10 | Disposition: A | Discharge status: Home / Self Care | Payer: Medicare Other | Source: Home / Self Care | Attending: Emergency Medicine | Admitting: Emergency Medicine

## 2017-04-10 ENCOUNTER — Other Ambulatory Visit: Payer: Self-pay

## 2017-04-10 DIAGNOSIS — J810 Acute pulmonary edema: Secondary | ICD-10-CM | POA: Diagnosis not present

## 2017-04-10 DIAGNOSIS — N186 End stage renal disease: Secondary | ICD-10-CM | POA: Diagnosis not present

## 2017-04-10 DIAGNOSIS — Z23 Encounter for immunization: Secondary | ICD-10-CM | POA: Diagnosis not present

## 2017-04-10 DIAGNOSIS — D631 Anemia in chronic kidney disease: Secondary | ICD-10-CM | POA: Diagnosis not present

## 2017-04-10 DIAGNOSIS — N2581 Secondary hyperparathyroidism of renal origin: Secondary | ICD-10-CM | POA: Diagnosis not present

## 2017-04-10 LAB — I-STAT CHEM 8, ED
BUN: 32 mg/dL — ABNORMAL HIGH (ref 6–20)
CALCIUM ION: 1.19 mmol/L (ref 1.15–1.40)
Chloride: 102 mmol/L (ref 101–111)
Creatinine, Ser: 10.9 mg/dL — ABNORMAL HIGH (ref 0.61–1.24)
GLUCOSE: 84 mg/dL (ref 65–99)
HCT: 25 % — ABNORMAL LOW (ref 39.0–52.0)
Hemoglobin: 8.5 g/dL — ABNORMAL LOW (ref 13.0–17.0)
Potassium: 4 mmol/L (ref 3.5–5.1)
SODIUM: 139 mmol/L (ref 135–145)
TCO2: 26 mmol/L (ref 22–32)

## 2017-04-10 LAB — I-STAT TROPONIN, ED: TROPONIN I, POC: 0.02 ng/mL (ref 0.00–0.08)

## 2017-04-10 NOTE — ED Provider Notes (Signed)
TIME SEEN: 3:39 AM  CHIEF COMPLAINT: shortness of breath  HPI: Pt is a 51 y.o. male with history of end-stage renal disease on hemodialysis Monday, Wednesday and Friday he was due for dialysis at 5:30 AM today, HIV who presents emergency department with shortness of breath that started this morning. States after receiving oxygen with EMS he started feeling much better. No chest pain or chest discomfort. No fever or cough. No missed dialysis. Was recently admitted to the hospital on Septra 9 for pulmonary edema and hypoxia. Does not wear oxygen at home.  ROS: See HPI Constitutional: no fever  Eyes: no drainage  ENT: no runny nose   Cardiovascular:  no chest pain  Resp:  SOB  GI: no vomiting GU: no dysuria Integumentary: no rash  Allergy: no hives  Musculoskeletal: no leg swelling  Neurological: no slurred speech ROS otherwise negative  PAST MEDICAL HISTORY/PAST SURGICAL HISTORY:  Past Medical History:  Diagnosis Date  . Anemia   . Arthritis   . CHF (congestive heart failure) ([place])   . Chronic kidney disease    stage 3-4 CKD, followed by Dr. Moshe Cipro  . ESRD (end stage renal disease) ([place])    ESRD due to HTN started dialysis Feb 2016  . Gout   . Gout, unspecified 08/13/2009   Qualifier: Diagnosis of  By: Redmond Pulling  MD, Mateo Flow    . H1N1 influenza    March 2016  . History of gout   . History of syphilis   . HIV (human immunodeficiency virus infection) ([place])   . HIV infection ([place]) 1980's   on ART therapy since, followed by ID clinic, complicated  by neuropathy  . HTN (hypertension)   . Hyperlipidemia    hypertrygliceridemia determined ti be secondary to ART therpay  . Hypertension   . HYPERTENSION 05/08/2006  . 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 Pneumonia   . Rib fractures 01/2009  . Seizure disorder ([place])   . Seizures ([place])    last seizure was >5 years ago, pt has family history of seizures  . 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 Syphilis 1997   history of syphilis 1997  . Tobacco abuse     MEDICATIONS:  Prior to Admission medications   Medication Sig Start Date End Date Taking? Authorizing Provider  acetaminophen (TYLENOL) 500 MG tablet Take 1,000 mg by mouth every 6 (six) hours as needed for mild pain.    [provider]  albuterol (PROVENTIL HFA;VENTOLIN HFA) 108 (90 Base) MCG/ACT inhaler Inhale 2 puffs into the lungs every 6 (six) hours as needed for wheezing or shortness of breath. 04/06/17   Lacroce, Hulen Shouts, MD  albuterol (PROVENTIL) (2.5 MG/3ML) 0.083% nebulizer solution Take 3 mLs (2.5 mg total) by nebulization every 6 (six) hours as needed for wheezing or shortness of breath. 09/04/16   Jule Ser, DO  amLODipine (NORVASC) 5 MG tablet Take 2 tablets (10 mg total) by mouth daily. 04/06/17 04/06/18  Melanee Spry, MD  buPROPion [facility]) 75 MG tablet TAKE 1/2 TABLET TWICE A DAY 02/10/17   Jule Ser, DO  carvedilol (COREG) 25 MG tablet Take 1 tablet (25 mg total) by mouth 2 (two) times daily with a meal. 04/06/17   Lacroce, Hulen Shouts, MD  doxazosin (CARDURA) 8 MG tablet Take 1 tablet (8 mg total) by mouth at bedtime. 04/06/17  Lacroce, Hulen Shouts, MD  EDURANT 25 MG TABS tablet TAKE 1 TABLET (25 MG TOTAL) BY MOUTH DAILY WITH BREAKFAST. 12/22/16   Carlyle Basques, MD  ferric citrate (AURYXIA) 1 GM 210 MG(Fe) tablet Take 420 mg by mouth 2 (two) times daily with a meal.     [provider]  furosemide (LASIX) 40 MG tablet Take 1 tablet (40 mg total) by mouth as needed for fluid or edema. 04/06/17   Lacroce, Hulen Shouts, MD  hydrALAZINE (APRESOLINE) 25 MG tablet Take 1 tablet (25 mg total) by mouth 3 (three) times daily. 04/06/17   Lacroce, Hulen Shouts, MD  montelukast (SINGULAIR) 10 MG tablet Take 1 tablet (10 mg total) by mouth at bedtime. 12/17/16   Jule Ser, DO  pravastatin  (PRAVACHOL) 40 MG tablet TAKE 1 TABLET (40 MG TOTAL) BY MOUTH EVERY EVENING. 01/27/17 01/27/18  Jule Ser, DO  TIVICAY 50 MG tablet TAKE 1 TABLET (50 MG TOTAL) BY MOUTH DAILY. 12/22/16   Carlyle Basques, MD  VIREAD 300 MG tablet TAKE 1 TABLET BY MOUTH ONCE A WEEK Patient taking differently: TAKE 1 TABLET BY MOUTH ONCE A WEEK ON SUNDAYS 01/06/17   Carlyle Basques, MD    ALLERGIES:  No Known Allergies  SOCIAL HISTORY:  Social History  Substance Use Topics  . Smoking status: Former Smoker    Packs/day: 0.40    Years: 20.00    Start date: 01/10/2016    Quit date: 04/07/2016  . Smokeless tobacco: Never Used  . Alcohol use No    FAMILY HISTORY: Family History  Problem Relation Age of Onset  . Cancer Mother   . Hypertension Mother   . COPD Father   . Hypertension Father   . Diabetes Sister   . Hypertension Sister   . Diabetes Brother   . Hypertension Brother   . Stroke Neg Hx     EXAM: BP (!) 198/94 (BP Location: Left Arm)   Pulse 82   Temp 98.4 F (36.9 C) (Oral)   Resp 18   SpO2 99%  CONSTITUTIONAL: Alert and oriented and responds appropriately to questions. Chronically ill-appearing but in no distress HEAD: Normocephalic EYES: Conjunctivae clear, pupils appear equal, EOMI ENT: normal nose; moist mucous membranes NECK: Supple, no meningismus, no nuchal rigidity, no LAD  CARD: RRR; S1 and S2 appreciated; no murmurs, no clicks, no rubs, no gallops RESP: Normal chest excursion without splinting or tachypnea; breath sounds clear and equal bilaterally; no wheezes, no rhonchi, no rales, no hypoxia or respiratory distress, speaking full sentences ABD/GI: Normal bowel sounds; non-distended; soft, non-tender, no rebound, no guarding, no peritoneal signs, no hepatosplenomegaly BACK:  The back appears normal and is non-tender to palpation, there is no CVA tenderness EXT: Normal ROM in all joints; non-tender to palpation; no edema; normal capillary refill; no cyanosis, no calf  tenderness or swelling    SKIN: Normal color for age and race; warm; no rash NEURO: Moves all extremities equally PSYCH: The patient's mood and manner are appropriate. Grooming and personal hygiene are appropriate.  MEDICAL DECISION MAKING: Patient here with shortness of breath. Chest x-ray shows pulmonary edema but is significantly decreased compared to previous. He is a stable right small pleural effusion. No pneumonia. No pneumothorax. We'll check basic labs and EKG. He is mildly hypertensive here. He is not hypoxic and his lungs are completely clear. No other signs of volume overload. I feel at his labs are unremarkable, he can be safely discharged for dialysis in 1-1/2 hours.  ED  PROGRESS: Patient's EKG shows no acute change. Patient has a normal potassium of 4.0. His hemoglobin is 8.5 which is better than his baseline. He has anemia of chronic kidney disease. Troponin is negative. He has not been hypoxic here. Doubt ACS, PE or dissection. I feel he can be discharged and go to his dialysis at 5:30 AM. He is comfortable with this plan. I feel he is short of breath because of his mild pulmonary edema which I feel dialysis will help correct.  At this time, I do not feel there is any life-threatening condition present. I have reviewed and discussed all results (EKG, imaging, lab, urine as appropriate) and exam findings with patient/family. I have reviewed nursing notes and appropriate previous records.  I feel the patient is safe to be discharged home without further emergent workup and can continue workup as an outpatient as needed. Discussed usual and customary return precautions. Patient/family verbalize understanding and are comfortable with this plan.  Outpatient follow-up has been provided if needed. All questions have been answered.   EKG Interpretation  Date/Time:  Friday April 10 2017 04:15:05 EDT Ventricular Rate:  91 PR Interval:    QRS Duration: 103 QT Interval:  383 QTC  Calculation: 472 R Axis:   71 Text Interpretation:  Sinus rhythm Probable left atrial enlargement RSR' in V1 or V2, right VCD or RVH ST elev, probable normal early repol pattern Artifact in lead(s) I III aVL No significant change since last tracing Confirmed by Ward, Cyril Mourning [phone]) on 04/10/2017 4:22:30 AM         Ward, Delice Bison, DO 04/10/17 0423

## 2017-04-10 NOTE — Discharge Instructions (Signed)
Please go straight to dialysis at 5:30 AM. You had a small amount of pulmonary edema which is fluid on your lungs. Your labs, EKG showed no change. You did not have any hypoxia which is low oxygen levels. Dialysis will help to remove this fluid from your lungs. Please do not miss your dialysis.If you are still feeling short of breath after dialysis, please follow-up with your primary care provider.  If you feel your shortness of breath is severe or you have chest pain, fever, please return to the closest emergency department.

## 2017-04-10 NOTE — Patient Outreach (Signed)
[facility]) Care Management  04/10/17  SADIQ MCCAULEY [birth year] [id number]  Successful outreach completed with patient. Patient identification verified.  Patient stated that he is doing ok today. He reported that he did have "an episode" yesterday and went to see a doctor. He was feeling short of breath and went to the emergency department last night. He stated that he was told he had "just a little fluid" but then went to his dialysis appointment today following the ED visit and they got the fluid off. He reported that he is feeling better and denies any shortness of breath at present.   Patient has no other complaints or concerns at present. RNCM advised that [facility] pharmacist has been trying to reach him, but is having difficulty getting through at his number. He stated that he has heard that same thing from others, but feel it is a phone issue. Requested that she does continue to outreach.   Patient requested to reschedule home visit next week due to carpet being installed on Tuesday, 9/25. Home visit rescheduled for later in the week.  RNCM encouraged him to call with any questions or concerns and he verbalized understanding.  Plan: RNCM to continue to follow patient for transition of care. Home visit scheduled for next week.  [place] R. Homer Miller, RN, BSN, [place] Management Coordinator [phone]

## 2017-04-12 ENCOUNTER — Inpatient Hospital Stay (HOSPITAL_COMMUNITY)
Admission: EM | Admit: 2017-04-12 | Discharge: 2017-04-15 | DRG: 291 | Disposition: A | Discharge status: Home / Self Care | Payer: Medicare Other | Attending: Internal Medicine | Admitting: Internal Medicine

## 2017-04-12 ENCOUNTER — Emergency Department (HOSPITAL_COMMUNITY): Payer: Medicare Other

## 2017-04-12 ENCOUNTER — Other Ambulatory Visit: Payer: Self-pay

## 2017-04-12 ENCOUNTER — Encounter (HOSPITAL_COMMUNITY): Payer: Self-pay | Admitting: Emergency Medicine

## 2017-04-12 DIAGNOSIS — Z820 Family history of epilepsy and other diseases of the nervous system: Secondary | ICD-10-CM

## 2017-04-12 DIAGNOSIS — I132 Hypertensive heart and chronic kidney disease with heart failure and with stage 5 chronic kidney disease, or end stage renal disease: Principal | ICD-10-CM | POA: Diagnosis present

## 2017-04-12 DIAGNOSIS — E784 Other hyperlipidemia: Secondary | ICD-10-CM | POA: Diagnosis present

## 2017-04-12 DIAGNOSIS — I5033 Acute on chronic diastolic (congestive) heart failure: Secondary | ICD-10-CM | POA: Diagnosis present

## 2017-04-12 DIAGNOSIS — J811 Chronic pulmonary edema: Secondary | ICD-10-CM | POA: Diagnosis not present

## 2017-04-12 DIAGNOSIS — R0600 Dyspnea, unspecified: Secondary | ICD-10-CM | POA: Diagnosis not present

## 2017-04-12 DIAGNOSIS — R0902 Hypoxemia: Secondary | ICD-10-CM | POA: Diagnosis not present

## 2017-04-12 DIAGNOSIS — J810 Acute pulmonary edema: Secondary | ICD-10-CM

## 2017-04-12 DIAGNOSIS — Z21 Asymptomatic human immunodeficiency virus [HIV] infection status: Secondary | ICD-10-CM | POA: Diagnosis present

## 2017-04-12 DIAGNOSIS — D631 Anemia in chronic kidney disease: Secondary | ICD-10-CM | POA: Diagnosis present

## 2017-04-12 DIAGNOSIS — Z992 Dependence on renal dialysis: Secondary | ICD-10-CM

## 2017-04-12 DIAGNOSIS — I120 Hypertensive chronic kidney disease with stage 5 chronic kidney disease or end stage renal disease: Secondary | ICD-10-CM | POA: Diagnosis not present

## 2017-04-12 DIAGNOSIS — I340 Nonrheumatic mitral (valve) insufficiency: Secondary | ICD-10-CM | POA: Diagnosis present

## 2017-04-12 DIAGNOSIS — J9601 Acute respiratory failure with hypoxia: Secondary | ICD-10-CM | POA: Diagnosis present

## 2017-04-12 DIAGNOSIS — G40909 Epilepsy, unspecified, not intractable, without status epilepticus: Secondary | ICD-10-CM | POA: Diagnosis present

## 2017-04-12 DIAGNOSIS — I272 Pulmonary hypertension, unspecified: Secondary | ICD-10-CM | POA: Diagnosis present

## 2017-04-12 DIAGNOSIS — Z8249 Family history of ischemic heart disease and other diseases of the circulatory system: Secondary | ICD-10-CM

## 2017-04-12 DIAGNOSIS — I509 Heart failure, unspecified: Secondary | ICD-10-CM

## 2017-04-12 DIAGNOSIS — F17210 Nicotine dependence, cigarettes, uncomplicated: Secondary | ICD-10-CM | POA: Diagnosis present

## 2017-04-12 DIAGNOSIS — N186 End stage renal disease: Secondary | ICD-10-CM | POA: Diagnosis present

## 2017-04-12 DIAGNOSIS — Z87891 Personal history of nicotine dependence: Secondary | ICD-10-CM | POA: Diagnosis not present

## 2017-04-12 DIAGNOSIS — I161 Hypertensive emergency: Secondary | ICD-10-CM | POA: Diagnosis present

## 2017-04-12 DIAGNOSIS — Z825 Family history of asthma and other chronic lower respiratory diseases: Secondary | ICD-10-CM

## 2017-04-12 HISTORY — DX: End stage renal disease: N18.6

## 2017-04-12 HISTORY — DX: Dependence on renal dialysis: Z99.2

## 2017-04-12 HISTORY — DX: Anxiety disorder, unspecified: F41.9

## 2017-04-12 HISTORY — DX: Unspecified asthma, uncomplicated: J45.909

## 2017-04-12 HISTORY — DX: Personal history of other medical treatment: Z92.89

## 2017-04-12 LAB — CBC WITH DIFFERENTIAL/PLATELET
BASOS ABS: 0 10*3/uL (ref 0.0–0.1)
Basophils Relative: 0 %
Eosinophils Absolute: 0.4 10*3/uL (ref 0.0–0.7)
Eosinophils Relative: 2 %
HCT: 30.8 % — ABNORMAL LOW (ref 39.0–52.0)
HEMOGLOBIN: 9.3 g/dL — AB (ref 13.0–17.0)
LYMPHS ABS: 3.2 10*3/uL (ref 0.7–4.0)
LYMPHS PCT: 18 %
MCH: 28.1 pg (ref 26.0–34.0)
MCHC: 30.2 g/dL (ref 30.0–36.0)
MCV: 93.1 fL (ref 78.0–100.0)
Monocytes Absolute: 0.7 10*3/uL (ref 0.1–1.0)
Monocytes Relative: 4 %
NEUTROS PCT: 76 %
Neutro Abs: 13.9 10*3/uL — ABNORMAL HIGH (ref 1.7–7.7)
PLATELETS: 187 10*3/uL (ref 150–400)
RBC: 3.31 MIL/uL — AB (ref 4.22–5.81)
RDW: 17.1 % — AB (ref 11.5–15.5)
WBC: 18.2 10*3/uL — AB (ref 4.0–10.5)

## 2017-04-12 LAB — COMPREHENSIVE METABOLIC PANEL
ALBUMIN: 3.7 g/dL (ref 3.5–5.0)
ALT: 17 U/L (ref 17–63)
ANION GAP: 11 (ref 5–15)
AST: 28 U/L (ref 15–41)
Alkaline Phosphatase: 114 U/L (ref 38–126)
BILIRUBIN TOTAL: 0.8 mg/dL (ref 0.3–1.2)
BUN: 35 mg/dL — ABNORMAL HIGH (ref 6–20)
CHLORIDE: 101 mmol/L (ref 101–111)
CO2: 25 mmol/L (ref 22–32)
Calcium: 9.6 mg/dL (ref 8.9–10.3)
Creatinine, Ser: 12.14 mg/dL — ABNORMAL HIGH (ref 0.61–1.24)
GFR calc Af Amer: 5 mL/min — ABNORMAL LOW (ref >60)
GFR calc non Af Amer: 4 mL/min — ABNORMAL LOW (ref >60)
GLUCOSE: 132 mg/dL — AB (ref 65–99)
POTASSIUM: 4.7 mmol/L (ref 3.5–5.1)
SODIUM: 137 mmol/L (ref 135–145)
TOTAL PROTEIN: 7.4 g/dL (ref 6.5–8.1)

## 2017-04-12 LAB — BRAIN NATRIURETIC PEPTIDE: B NATRIURETIC PEPTIDE 5: 830.9 pg/mL — AB (ref 0.0–100.0)

## 2017-04-12 LAB — I-STAT CG4 LACTIC ACID, ED: Lactic Acid, Venous: 1.23 mmol/L (ref 0.5–1.9)

## 2017-04-12 MED ORDER — ASPIRIN 81 MG PO CHEW: 324.0000 mg | CHEWABLE_TABLET | Freq: Once | ORAL | Status: DC

## 2017-04-12 MED ORDER — NITROGLYCERIN IN D5W 200-5 MCG/ML-% IV SOLN
0.0000 ug/min | Freq: Once | INTRAVENOUS | Status: AC
  Administered 2017-04-12: 10 ug/min via INTRAVENOUS

## 2017-04-12 NOTE — Consult Note (Deleted)
Renal Service Consult Note Darrell Johnson  Darrell Johnson 04/12/2017 [name] Requesting Physician:  ED physician [facility]  Reason for Consult:  ESRD pt with acute resp distress HPI: The patient is a 51 y.o. year-old with hx of HTN , CHF, gout, HL, HIV , seizure d/o presenting to ED this evening with resp distress.  Started 30 min before calling EMS.  +associated chest pressure.  Put on bipap and started on IV NTG in the ED.  BP was 212/104 on arrival and now is down to 120/82.  Pt's RR was 42 and now is 20.  He is feeling much better. Asked to see for ESRD.    Pt was here early Sept for vol overload and suspected lean body wt loss.  Left at 111kg w/ plans to lower dry wt further in OP setting.    Tonight pt provides limited hx due to bipap.  No active CP at this time.  No abd pain, no recent n/v/d.      ROS  denies CP  no joint pain   no HA  no blurry vision  no rash  no diarrhea  no nausea/ vomiting    Past Medical History  Past Medical History:  Diagnosis Date  . Anemia   . Arthritis   . Asthma   . CHF (congestive heart failure) ([place])   . Chronic kidney disease    stage 3-4 CKD, followed by Dr. Moshe Cipro  . ESRD (end stage renal disease) ([place])    ESRD due to HTN started dialysis Feb 2016  . Gout   . Gout, unspecified 08/13/2009   Qualifier: Diagnosis of  By: Redmond Pulling  MD, Mateo Flow    . H1N1 influenza    March 2016  . History of gout   . History of syphilis   . HIV (human immunodeficiency virus infection) ([place])   . HIV infection ([place]) 1980's   on ART therapy since, followed by ID clinic, complicated  by neuropathy  . HTN (hypertension)   . Hyperlipidemia    hypertrygliceridemia determined ti be secondary to ART therpay  . Hypertension   . HYPERTENSION 05/08/2006  . 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 Pneumonia   . Rib fractures 01/2009  . Seizure disorder ([place])   . Seizures ([place])    last seizure was >5 years ago, pt has family history of seizures   . 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 Syphilis 1997   history of syphilis 1997  . Tobacco abuse    Past Surgical History  Past Surgical History:  Procedure Laterality Date  . ABSCESS DRAINAGE    . AV FISTULA PLACEMENT Right 12/02/2013   Procedure: ARTERIOVENOUS (AV) FISTULA CREATION;  Surgeon: Rosetta Posner, MD;  Location: [place];  Service: Vascular;  Laterality: Right;  . INCISION AND DRAINAGE ABSCESS Right 03/09/2017   Procedure: INCISION AND DRAINAGE Right Scrotal Abscess;  Surgeon: Franchot Gallo, MD;  Location: [place];  Service: Urology;  Laterality: Right;   Family History  Family History  Problem Relation Age of Onset  . Cancer Mother   . Hypertension Mother   . COPD Father   . Hypertension Father   . Diabetes Sister   . Hypertension Sister   . Diabetes Brother   . Hypertension Brother   . Stroke Neg Hx    Social History  reports that he quit smoking about a year ago. He started smoking about 15 months ago. He has a 8.00 pack-year smoking history. He has never used smokeless tobacco. He reports that he does not drink alcohol or use drugs. Allergies No Known Allergies Home medications Prior to Admission medications   Medication Sig Start Date End Date Taking? Authorizing Provider  acetaminophen (TYLENOL) 500 MG tablet Take 1,000 mg by mouth every 6 (six) hours as needed for mild pain.   Yes [provider]  albuterol (PROVENTIL) (2.5 MG/3ML) 0.083% nebulizer solution Take 3 mLs (2.5 mg total) by nebulization every 6 (six) hours as needed for wheezing or shortness of breath. 09/04/16  Yes Jule Ser, DO  buPROPion [facility]) 75 MG tablet TAKE 1/2 TABLET TWICE A DAY 02/10/17  Yes Jule Ser, DO  EDURANT 25 MG TABS tablet TAKE 1 TABLET (25 MG TOTAL) BY MOUTH DAILY WITH BREAKFAST. 12/22/16  Yes Carlyle Basques, MD   ferric citrate (AURYXIA) 1 GM 210 MG(Fe) tablet Take 420 mg by mouth 2 (two) times daily with a meal.    Yes [provider]  montelukast (SINGULAIR) 10 MG tablet Take 1 tablet (10 mg total) by mouth at bedtime. 12/17/16  Yes Jule Ser, DO  pravastatin (PRAVACHOL) 40 MG tablet TAKE 1 TABLET (40 MG TOTAL) BY MOUTH EVERY EVENING. 01/27/17 01/27/18 Yes Jule Ser, DO  TIVICAY 50 MG tablet TAKE 1 TABLET (50 MG TOTAL) BY MOUTH DAILY. 12/22/16  Yes Carlyle Basques, MD  VIREAD 300 MG tablet TAKE 1 TABLET BY MOUTH ONCE A WEEK Patient taking differently: TAKE 1 TABLET BY MOUTH ONCE A WEEK ON SUNDAYS 01/06/17  Yes Carlyle Basques, MD  albuterol (PROVENTIL HFA;VENTOLIN HFA) 108 (90 Base) MCG/ACT inhaler Inhale 2 puffs into the lungs every 6 (six) hours as needed for wheezing or shortness of breath. 04/06/17   Lacroce, Hulen Shouts, MD  amLODipine (NORVASC) 5 MG tablet Take 2 tablets (10 mg total) by mouth daily. 04/06/17 04/06/18  Melanee Spry, MD  carvedilol (COREG) 25 MG tablet Take 1 tablet (25 mg total) by mouth 2 (two) times daily with a meal. 04/06/17   Lacroce, Hulen Shouts, MD  doxazosin (CARDURA) 8 MG tablet Take 1 tablet (8 mg total) by mouth at bedtime. 04/06/17   Lacroce, Hulen Shouts, MD  furosemide (LASIX) 40 MG tablet Take 1 tablet (40 mg total) by mouth as needed for fluid or edema. 04/06/17   Lacroce, Hulen Shouts, MD  hydrALAZINE (APRESOLINE) 25 MG tablet Take 1 tablet (25 mg total) by mouth 3 (three) times daily. 04/06/17   Melanee Spry, MD   Liver Function Tests  Recent Labs Lab 04/12/17 2100  AST 28  ALT 17  ALKPHOS 114  BILITOT 0.8  PROT 7.4  ALBUMIN 3.7   No results for input(s): LIPASE, AMYLASE in the last 168 hours. CBC  Recent Labs Lab 04/10/17 0403 04/12/17 2100  WBC  --  18.2*  NEUTROABS  --  13.9*  HGB 8.5* 9.3*  HCT 25.0* 30.8*  MCV  --  93.1  PLT  --  283   Basic Metabolic Panel  Recent Labs Lab 04/10/17 0403 04/12/17 2100  NA 139 137   K 4.0 4.7  CL 102 101  CO2  --  25  GLUCOSE 84 132*  BUN 32* 35*  CREATININE 10.90* 12.14*  CALCIUM  --  9.6   Iron/TIBC/Ferritin/ %Sat    Component Value Date/Time   IRON 20 (L) 09/28/2014 1015   TIBC  243 09/28/2014 1015   FERRITIN 590 (H) 09/28/2014 1015   IRONPCTSAT 8 (L) 09/28/2014 1015    Vitals:   03/30/17 2051 03/31/17 0053 03/31/17 0502 03/31/17 0700  BP: (!) 177/73 (!) 165/84 (!) 164/100   Pulse: 94 82 90   Resp: 19 (!) 21 20   Temp: 99.6 F (37.6 C) 99.5 F (37.5 C) 99.2 F (37.3 C) 98.6 F (37 C)  TempSrc: Oral Oral Oral Oral  SpO2: 96% 99% 98%   Weight:   107 kg (236 lb)   Height:       Exam Gen on bipap, obese AAM, not in distress, responsive and appropriate No rash, cyanosis or gangrene Sclera anicteric, throat clear   No jvd or bruits Chest bibasilar rales  RRR no MRG Abd soft ntnd no mass or ascites +bs GU normal male MS no joint effusions or deformity Ext 1+ bilat LE edema / no wounds or ulcers Neuro is alert, Ox 3 , nf RFA AVF patent    Dialysis: [place] MWF  4h 19min   2/2 bath  Hep 6000 > 3000  F200  RUA AVF Dry wt pending    Impression: 1.  Acute resp failure/ hypoxemia - improving w bipap/ IV NTG. Will plan HD tonight, max UF 3-4 L. Not sure how much is vol overload vs flash pulm edema from uncont HTN.  2.  ESRD usual HD is MWF 3.  HTN 4.  HIV 5.  Hx seizure d/o    Plan - as above  Kelly Splinter MD [employer] pager [phone]   04/12/2017, 11:48 PM

## 2017-04-12 NOTE — ED Notes (Signed)
ED Provider at bedside. 

## 2017-04-12 NOTE — ED Triage Notes (Signed)
Note in progress

## 2017-04-12 NOTE — H&P (Signed)
Date: 04/13/2017               Patient Name:  Darrell Johnson MRN: [id number]  DOB: 01/05/1966 Age / Sex: 51 y.o., male   PCP: Darrell Ser, DO         Medical Service: Internal Medicine Teaching Service         Attending Physician: Dr. Bartholomew Crews, MD    First Contact: Dr. Frederico Johnson Pager: [phone]  Second Contact: Dr. Lynnae Johnson Pager: [phone]       After Hours (After 5p/  First Contact Pager: [phone]  weekends / holidays): Second Contact Pager: [phone]   Chief Complaint: Shortness of breath  History of Present Illness: Darrell Johnson is a 51 y.o m with pmh of HIV, esrd, chf, essential htn who presented with shortness of breath. The patient was on bipap and was not alert so history was mainly gathered from wife at bedside. The patient has been having shortness of breaths for the past few weeks, but it has been getting progressively worse. For the past few days the patient has been sleeping in a recliner. He does not have any chest pain, fever, generalized swelling, dizziness, or change in urination. He has had some headaches recently and snores occasionally. Per chart review, the patient reported to ems and nephrology that he had chest pressure. He has been adherent with all his medication and has not missed any dialysis sessions.   The patient was recently seen in the ED on 04/10/17 for shortness of breath was ruled out for pneumonia, pe, acs, or dissection and sent home.   Previous hospitalization 9/9-9/11 for pulmonary edema during which he was managed with bipap and hemodialysis twice. He was 236lbs at discharge during that hospitalization.   ED Course: Given albuterol and atrovent by ems. In ED the patient's vitals 212/104, 42 respiratory rate, 116 pulse, saturation 87%. Placed on bipap and nitroglycerin drip.   Meds:  Current Meds  Medication Sig  . acetaminophen (TYLENOL) 500 MG tablet Take 1,000 mg by mouth every 6 (six) hours as needed for mild pain.  Marland Kitchen albuterol  (PROVENTIL HFA;VENTOLIN HFA) 108 (90 Base) MCG/ACT inhaler Inhale 2 puffs into the lungs every 6 (six) hours as needed for wheezing or shortness of breath.  Marland Kitchen albuterol (PROVENTIL) (2.5 MG/3ML) 0.083% nebulizer solution Take 3 mLs (2.5 mg total) by nebulization every 6 (six) hours as needed for wheezing or shortness of breath.  Marland Kitchen amLODipine (NORVASC) 5 MG tablet Take 2 tablets (10 mg total) by mouth daily.  Marland Kitchen buPROPion (WELLBUTRIN) 75 MG tablet TAKE 1/2 TABLET TWICE A DAY  . carvedilol (COREG) 25 MG tablet Take 1 tablet (25 mg total) by mouth 2 (two) times daily with a meal.  . doxazosin (CARDURA) 8 MG tablet Take 1 tablet (8 mg total) by mouth at bedtime.  Marland Kitchen EDURANT 25 MG TABS tablet TAKE 1 TABLET (25 MG TOTAL) BY MOUTH DAILY WITH BREAKFAST.  . ferric citrate (AURYXIA) 1 GM 210 MG(Fe) tablet Take 420 mg by mouth 2 (two) times daily with a meal.   . furosemide (LASIX) 40 MG tablet Take 1 tablet (40 mg total) by mouth as needed for fluid or edema.  . hydrALAZINE (APRESOLINE) 25 MG tablet Take 1 tablet (25 mg total) by mouth 3 (three) times daily.  . montelukast (SINGULAIR) 10 MG tablet Take 1 tablet (10 mg total) by mouth at bedtime.  . pravastatin (PRAVACHOL) 40 MG tablet TAKE 1 TABLET (40 MG TOTAL) BY  MOUTH EVERY EVENING.  Marland Kitchen TIVICAY 50 MG tablet TAKE 1 TABLET (50 MG TOTAL) BY MOUTH DAILY.  Marland Kitchen VIREAD 300 MG tablet TAKE 1 TABLET BY MOUTH ONCE A WEEK (Patient taking differently: TAKE 1 TABLET BY MOUTH ONCE A WEEK ON SUNDAYS)     Allergies: Allergies as of 04/12/2017  . (No Known Allergies)   Past Medical History:  Diagnosis Date  . Anemia   . Arthritis   . Asthma   . CHF (congestive heart failure) ([place])   . Chronic kidney disease    stage 3-4 CKD, followed by Dr. Moshe Cipro  . ESRD (end stage renal disease) ([place])    ESRD due to HTN started dialysis Feb 2016  . Gout   . Gout, unspecified 08/13/2009   Qualifier: Diagnosis of  By: Redmond Pulling  MD, Mateo Flow    . H1N1 influenza    March 2016    . History of gout   . History of syphilis   . HIV (human immunodeficiency virus infection) ([place])   . HIV infection ([place]) 1980's   on ART therapy since, followed by ID clinic, complicated  by neuropathy  . HTN (hypertension)   . Hyperlipidemia    hypertrygliceridemia determined ti be secondary to ART therpay  . Hypertension   . HYPERTENSION 05/08/2006  . 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 Pneumonia   . Rib fractures 01/2009  . Seizure disorder ([place])   . Seizures ([place])    last seizure was >5 years ago, pt has family history of seizures  . 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 Syphilis 1997   history of syphilis 1997  . Tobacco abuse     Family History:  Family History  Problem Relation Age of Onset  . Cancer Mother   . Hypertension Mother   . COPD Father   . Hypertension Father   . Diabetes Sister   . Hypertension Sister   . Diabetes Brother   . Hypertension Brother   . Stroke Neg Hx    Social History:  Social History   Social History  . Marital status: Married    Spouse name: N/A  . Number of children: N/A  . Years of education: N/A   Social History Main Topics  . Smoking status: Former Smoker    Packs/day: 0.40    Years: 20.00    Start date: 01/10/2016    Quit date: 04/07/2016  . Smokeless tobacco: Never Used  . Alcohol use No  . Drug use: No  . Sexual activity: Not Asked     Comment: given condoms   Other Topics Concern  . None   Social History Narrative   ** Merged History Encounter **      Has 1 cigarette/day   Review of Systems: A complete ROS was negative except as per HPI.   Physical Exam: Blood pressure 127/70, pulse 67, temperature (!) 97.5 F (36.4 C), resp. rate 18, height 6\' 2"  (1.88 m), weight 240 lb 15.4 oz (109.3 kg), SpO2 100 %. Physical Exam  Constitutional: He appears lethargic. He appears  ill. He appears distressed.  On bipap   HENT:  Head: Normocephalic and atraumatic.  Neck:  jvd could not be visualized due to body habitus  Cardiovascular: Normal rate, regular rhythm, normal heart sounds and intact distal pulses.   Pulmonary/Chest: He is in respiratory distress. He  has rales in the right middle field, the right lower field, the left middle field and the left lower field.  Abdominal: Bowel sounds are normal. He exhibits distension. There is no tenderness.  Musculoskeletal: He exhibits no edema (no pitting).  Neurological: He appears lethargic.  Skin: Skin is dry.     Labs:  CBC    Component Value Date/Time   WBC 18.2 (H) 04/12/2017 2100   RBC 3.31 (L) 04/12/2017 2100   HGB 9.3 (L) 04/12/2017 2100   HCT 30.8 (L) 04/12/2017 2100   PLT 187 04/12/2017 2100   MCV 93.1 04/12/2017 2100   MCH 28.1 04/12/2017 2100   MCHC 30.2 04/12/2017 2100   RDW 17.1 (H) 04/12/2017 2100   LYMPHSABS 3.2 04/12/2017 2100   MONOABS 0.7 04/12/2017 2100   EOSABS 0.4 04/12/2017 2100   EOSABS 0.2 K/UL 05/26/2006 1229   BASOSABS 0.0 04/12/2017 2100    CMP     Component Value Date/Time   NA 137 04/12/2017 2100   K 4.7 04/12/2017 2100   CL 101 04/12/2017 2100   CO2 25 04/12/2017 2100   GLUCOSE 132 (H) 04/12/2017 2100   BUN 35 (H) 04/12/2017 2100   CREATININE 12.14 (H) 04/12/2017 2100   CREATININE 10.32 (H) 09/24/2015 1103   CALCIUM 9.6 04/12/2017 2100   PROT 7.4 04/12/2017 2100   ALBUMIN 3.7 04/12/2017 2100   AST 28 04/12/2017 2100   ALT 17 04/12/2017 2100   ALKPHOS 114 04/12/2017 2100   BILITOT 0.8 04/12/2017 2100   GFRNONAA 4 (L) 04/12/2017 2100   GFRNONAA 12 (L) 11/16/2014 1551   GFRAA 5 (L) 04/12/2017 2100   GFRAA 14 (L) 11/16/2014 1551   Lactic acid=1.23 BNP=830  EKG: sinus tachycardia, biatrial enlargement, RSR' in V1 or V2, right VCD or RVH  CXR: Per Dr. Randel Pigg Cardiomegaly with bilateral airspace opacities consistent with CHF. Superimposed pneumonia would be  difficult to entirely exclude as with bilateral layering pleural effusions.  Assessment & Plan by Problem: Active Problems:   Acute pulmonary edema (HCC)   ESRD on dialysis ([place])   Acute respiratory failure with hypoxia (HCC)   Acute exacerbation of CHF (congestive heart failure) ([place])  51 y.o m with pmh of esrd, htn, and chf presents with sudden onset sob and accompanied elevation in bp, rr, and hr.   Acute respiratory failure with hypoxia Acute pulmonary edema The patient presented with shortness of breath, chest pressure, and an elevated bp= 212/104, rr=42, sp02 87%, hr=119. On exam, the patient did not have any pitting edema in lower extremities, he had rales in his bilateral lower and middle lobes, and turgor was good. Chest x-ray shows presence of kerley b lines and opacities consistent with chf. The BNP=830.9. Hence, the patient has findings consistent with flash pulmonary edema.  The pulmonary edema is likely to have a multifactorial etiology from his chf, htn, and esrd.   The patient has a wbc=18.2, lactic acid=1.23, and is afebrile and therefore unlikely to have a concomitant infectious process. There are no st changes on ekg.   -Nitroglycerin -Placed on Bipap -Consider AGB if patient appears clinically worsened -Troponin  ESRD Cr=12.14, GFR=4 Gets dialyzed on a mwf schedule  -Seen by nephrology and is planned for hd tonight with uf goal of 3-4 L  Congestive Heart Failure  Patient's last echo showed EF 55-60%, no regional wall motion abnormalities, mild concentric hypertrophy.  -Need weight for this admission, patient's dry weight based on previous admission is 236lbs  HIV Patient's last  cd4=600 and RNA quant=28 -Continue Tivicay 50mg , edurant 25mg , and viread 300mg     Essential Hypertension The patient's bp since admission has been 116-212/70-104 -Continue home amlodipine, carvedilol 25mg  bid, hydralazine 25mg  tid  Normocytic Anemia Hb=9.3, mcv=93, RDW=17.1 Fe=20,  tibc=243, ferritin=590, iron % satu=8 in March 2016 The patient likely has iron deficiency anemia per iron studies -Consider repeat iron studies -Continue ferric citrate   Dispo: Admit patient to Inpatient with expected length of stay greater than 2 midnights.  Signed: Lars Mage, MD Internal Medicine PGY1 Pager:[phone] 04/13/2017, 6:23 AM

## 2017-04-12 NOTE — ED Provider Notes (Signed)
[facility] DEPT Provider Note   CSN: [id number] Arrival date & time: 04/12/17  2053     History   Chief Complaint Chief Complaint  Patient presents with  . Shortness of Breath    HPI Darrell Johnson is a 51 y.o. male.  HPI   51 year old male with history of end-stage renal disease, CHF, hypertension here with acute onset of shortness of breath. On arrival, history is severely limited due to severe shortness of breath. Per report from EMS, the patient was well until approximately 30 minutes ago. He then developed acute onset of severe shortness of breath. He began breathing very heavily and felt like he could not catch his breath. He's had associated chest pressure. He is unable to provide history at this time due to stress for distress and use of BiPAP.  Level 5 caveat invoked as remainder of history, ROS, and physical exam limited due to patient's SOB.   Past Medical History:  Diagnosis Date  . Anemia   . Arthritis   . Asthma   . CHF (congestive heart failure) ([place])   . Chronic kidney disease    stage 3-4 CKD, followed by Dr. Moshe Cipro  . ESRD (end stage renal disease) ([place])    ESRD due to HTN started dialysis Feb 2016  . Gout   . Gout, unspecified 08/13/2009   Qualifier: Diagnosis of  By: Redmond Pulling  MD, Mateo Flow    . H1N1 influenza    March 2016  . History of gout   . History of syphilis   . HIV (human immunodeficiency virus infection) ([place])   . HIV infection ([place]) 1980's   on ART therapy since, followed by ID clinic, complicated  by neuropathy  . HTN (hypertension)   . Hyperlipidemia    hypertrygliceridemia determined ti be secondary to ART therpay  . Hypertension   . HYPERTENSION 05/08/2006  . 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 Pneumonia   . Rib fractures 01/2009  . Seizure disorder ([place])   . Seizures ([place])    last seizure was >5 years ago, pt has family history of seizures  . 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 Syphilis 1997   history of syphilis 1997  . Tobacco abuse     Patient Active Problem List   Diagnosis Date Noted  . Acute exacerbation of CHF (congestive heart failure) ([place]) 04/12/2017  . Acute respiratory failure with hypoxia ([place]) 03/29/2017  . Scrotal abscess 03/09/2017  . Trigger finger of right hand 09/11/2016  . Abscess of groin, right 09/04/2016  . Hyperlipidemia 03/31/2016  . Anxiety state 03/11/2016  . Lumbar radiculopathy 01/28/2016  . Seasonal allergies   . Healthcare maintenance 05/03/2015  . Anemia in chronic kidney disease 08/30/2014  . ESRD on dialysis ([place]) 12/27/2013  . Drug noncompliance 11/07/2013  . History of syphilis 11/07/2013  . Arthritis 09/09/2013  . Tobacco abuse 09/07/2013  . Acute pulmonary edema ([place]) 06/11/2013  . Chronic pain disorder 04/28/2013  . HYPERTRIGLYCERIDEMIA 11/01/2009  . Gout 08/13/2009  . ERECTILE DYSFUNCTION 06/08/2008  . HIV disease ([place]) 05/08/2006  . PERIPHERAL NEUROPATHY 05/08/2006  . Essential hypertension 05/08/2006  . SEIZURE DISORDER 05/08/2006    Past Surgical History:  Procedure Laterality Date  . ABSCESS DRAINAGE    . AV FISTULA PLACEMENT Right 12/02/2013   Procedure: ARTERIOVENOUS (AV) FISTULA CREATION;  Surgeon: Arvilla Meres  Early, MD;  Location: [facility] OR;  Service: Vascular;  Laterality: Right;  . INCISION AND DRAINAGE ABSCESS Right 03/09/2017   Procedure: INCISION AND DRAINAGE Right Scrotal Abscess;  Surgeon: Franchot Gallo, MD;  Location: [facility];  Service: Urology;  Laterality: Right;       Home Medications    Prior to Admission medications   Medication Sig Start Date End Date Taking? Authorizing Provider  acetaminophen (TYLENOL) 500 MG tablet Take 1,000 mg by mouth every 6 (six) hours as needed for mild pain.   Yes [provider]  albuterol (PROVENTIL HFA;VENTOLIN HFA) 108 (90 Base) MCG/ACT inhaler  Inhale 2 puffs into the lungs every 6 (six) hours as needed for wheezing or shortness of breath. 04/06/17  Yes Lacroce, Hulen Shouts, MD  albuterol (PROVENTIL) (2.5 MG/3ML) 0.083% nebulizer solution Take 3 mLs (2.5 mg total) by nebulization every 6 (six) hours as needed for wheezing or shortness of breath. 09/04/16  Yes Jule Ser, DO  amLODipine (NORVASC) 5 MG tablet Take 2 tablets (10 mg total) by mouth daily. 04/06/17 04/06/18 Yes Lacroce, Hulen Shouts, MD  buPROPion [facility]) 75 MG tablet TAKE 1/2 TABLET TWICE A DAY 02/10/17  Yes Jule Ser, DO  carvedilol (COREG) 25 MG tablet Take 1 tablet (25 mg total) by mouth 2 (two) times daily with a meal. 04/06/17  Yes Lacroce, Hulen Shouts, MD  doxazosin (CARDURA) 8 MG tablet Take 1 tablet (8 mg total) by mouth at bedtime. 04/06/17  Yes Lacroce, Hulen Shouts, MD  EDURANT 25 MG TABS tablet TAKE 1 TABLET (25 MG TOTAL) BY MOUTH DAILY WITH BREAKFAST. 12/22/16  Yes Carlyle Basques, MD  ferric citrate (AURYXIA) 1 GM 210 MG(Fe) tablet Take 420 mg by mouth 2 (two) times daily with a meal.    Yes [provider]  furosemide (LASIX) 40 MG tablet Take 1 tablet (40 mg total) by mouth as needed for fluid or edema. 04/06/17  Yes Lacroce, Hulen Shouts, MD  hydrALAZINE (APRESOLINE) 25 MG tablet Take 1 tablet (25 mg total) by mouth 3 (three) times daily. 04/06/17  Yes Lacroce, Hulen Shouts, MD  montelukast (SINGULAIR) 10 MG tablet Take 1 tablet (10 mg total) by mouth at bedtime. 12/17/16  Yes Jule Ser, DO  pravastatin (PRAVACHOL) 40 MG tablet TAKE 1 TABLET (40 MG TOTAL) BY MOUTH EVERY EVENING. 01/27/17 01/27/18 Yes Jule Ser, DO  TIVICAY 50 MG tablet TAKE 1 TABLET (50 MG TOTAL) BY MOUTH DAILY. 12/22/16  Yes Carlyle Basques, MD  VIREAD 300 MG tablet TAKE 1 TABLET BY MOUTH ONCE A WEEK Patient taking differently: TAKE 1 TABLET BY MOUTH ONCE A WEEK ON SUNDAYS 01/06/17  Yes Carlyle Basques, MD    Family History Family History  Problem Relation Age of Onset  . Cancer  Mother   . Hypertension Mother   . COPD Father   . Hypertension Father   . Diabetes Sister   . Hypertension Sister   . Diabetes Brother   . Hypertension Brother   . Stroke Neg Hx     Social History Social History  Substance Use Topics  . Smoking status: Former Smoker    Packs/day: 0.40    Years: 20.00    Start date: 01/10/2016    Quit date: 04/07/2016  . Smokeless tobacco: Never Used  . Alcohol use No     Allergies   Patient has no known allergies.   Review of Systems Review of Systems  Constitutional: Positive for fatigue.  Respiratory: Positive for cough, chest tightness and shortness  of breath.   Cardiovascular: Positive for leg swelling.  All other systems reviewed and are negative.    Physical Exam Updated Vital Signs BP 120/82   Pulse 80   Temp 98.9 F (37.2 C) (Axillary)   Resp (!) 22   Ht 6\' 2"  (1.88 m)   Wt 109.3 kg (241 lb)   SpO2 100%   BMI 30.94 kg/m   Physical Exam  Constitutional: He is oriented to person, place, and time. He appears well-developed and well-nourished. He appears distressed.  HENT:  Head: Normocephalic and atraumatic.  Eyes: Conjunctivae are normal.  Neck: Neck supple. JVD present.  Cardiovascular: Regular rhythm and normal heart sounds.  Tachycardia present.  Exam reveals no friction rub.   No murmur heard. Pulmonary/Chest: Accessory muscle usage present. Tachypnea noted. He is in respiratory distress. He has decreased breath sounds. He has wheezes. He has rales in the right upper field, the right middle field, the right lower field, the left upper field, the left middle field and the left lower field.  Abdominal: He exhibits no distension.  Musculoskeletal: He exhibits no edema.  Neurological: He is alert and oriented to person, place, and time. He exhibits normal muscle tone.  Skin: Skin is warm. Capillary refill takes less than 2 seconds. He is diaphoretic.  Psychiatric: He has a normal mood and affect.  Nursing note and  vitals reviewed.    ED Treatments / Results  Labs (all labs ordered are listed, but only abnormal results are displayed) Labs Reviewed  CBC WITH DIFFERENTIAL/PLATELET - Abnormal; Notable for the following:       Result Value   WBC 18.2 (*)    RBC 3.31 (*)    Hemoglobin 9.3 (*)    HCT 30.8 (*)    RDW 17.1 (*)    Neutro Abs 13.9 (*)    All other components within normal limits  COMPREHENSIVE METABOLIC PANEL - Abnormal; Notable for the following:    Glucose, Bld 132 (*)    BUN 35 (*)    Creatinine, Ser 12.14 (*)    GFR calc non Af Amer 4 (*)    GFR calc Af Amer 5 (*)    All other components within normal limits  BRAIN NATRIURETIC PEPTIDE - Abnormal; Notable for the following:    B Natriuretic Peptide 830.9 (*)    All other components within normal limits  I-STAT CG4 LACTIC ACID, ED  I-STAT CG4 LACTIC ACID, ED    EKG  EKG Interpretation  Date/Time:  Sunday April 12 2017 20:54:29 EDT Ventricular Rate:  114 PR Interval:    QRS Duration: 103 QT Interval:  334 QTC Calculation: 460 R Axis:   83 Text Interpretation:  Sinus tachycardia LAE, consider biatrial enlargement RSR' in V1 or V2, right VCD or RVH SINCE LAST TRACING HEART RATE HAS INCREASED Confirmed by Duffy Bruce [phone]) on 04/12/2017 11:25:40 PM       Radiology Dg Chest Portable 1 View  Result Date: 04/12/2017 CLINICAL DATA:  Dyspnea, dialysis patient for 2 years. EXAM: PORTABLE CHEST 1 VIEW COMPARISON:  None. FINDINGS: Stable cardiomegaly. Chronic blunting of the right costophrenic angle laterally may reflect pleural thickening. No aortic aneurysm. New confluent airspace opacities, predominantly within the mid to lower lung are seen bilaterally with pulmonary vascular redistribution consistent with CHF. Bilateral superimposed pneumonia would be difficult to entirely exclude at the lung bases. No acute nor suspicious osseous abnormality. IMPRESSION: Cardiomegaly with bilateral airspace opacities consistent with  CHF. Superimposed pneumonia would be difficult  to entirely exclude as with bilateral layering pleural effusions. Electronically Signed   By: Ashley Royalty M.D.   On: 04/12/2017 21:43    Procedures .Critical Care Performed by: Duffy Bruce Authorized by: Duffy Bruce   Critical care provider statement:    Critical care time (minutes):  35   Critical care time was exclusive of:  Separately billable procedures and treating other patients and teaching time   Critical care was necessary to treat or prevent imminent or life-threatening deterioration of the following conditions:  Cardiac failure, circulatory failure and respiratory failure   Critical care was time spent personally by me on the following activities:  Development of treatment plan with patient or surrogate, discussions with consultants, evaluation of patient's response to treatment, examination of patient, obtaining history from patient or surrogate, ordering and performing treatments and interventions, ordering and review of laboratory studies, ordering and review of radiographic studies, pulse oximetry, re-evaluation of patient's condition and review of old charts   I assumed direction of critical care for this patient from another provider in my specialty: no      (including critical care time)  Medications Ordered in ED Medications  aspirin chewable tablet 324 mg (not administered)  nitroGLYCERIN 50 mg in dextrose 5 % 250 mL (0.2 mg/mL) infusion (5 mcg/min Intravenous Rate/Dose Change 04/12/17 2256)     Initial Impression / Assessment and Plan / ED Course  I have reviewed the triage vital signs and the nursing notes.  Pertinent labs & imaging results that were available during my care of the patient were reviewed by me and considered in my medical decision making (see chart for details).     51 year old male with history of end-stage renal disease, CHF, here with acute onset of severe shortness of breath. On arrival,  the patient is markedly hypertensive, tachycardic, and tachypnea. His chest x-ray is concerning for acute flash edema. Immediately on arrival, the patient was started on a nitroglycerin drip as well as BiPAP. He had significant improvement in his work of breathing and blood pressure with this. I suspect most of this is all secondary to acute flash edema. He does have mild leukocytosis, but this may be reactive. He denies any fevers or chills prior to the onset of symptoms and has a normal lactic acid. Will consult internal medicine regarding admission as well as possible indication for antibiotics. He has no evidence of severe sepsis at this time. I have also discussed with nephrology, Dr. Jonnie Finner, who will evaluate the patient regarding need for urgent versus emergent dialysis.   This note was prepared with assistance of Systems analyst. Occasional wrong-word or sound-a-like substitutions may have occurred due to the inherent limitations of voice recognition software.  Final Clinical Impressions(s) / ED Diagnoses   Final diagnoses:  Flash pulmonary edema ([place])  ESRD (end stage renal disease) ([place])      Duffy Bruce, MD 04/12/17 2349

## 2017-04-12 NOTE — ED Triage Notes (Signed)
Per EMS: Pt was seen at [facility] 3 days ago for fluid on lungs. Pt has hx of asthma and started to feel SOB today.  Pt received dialysis 2 days ago and is due to go tmrw. Pt given 10 of Albuterol and .5 of Atrovent.  Pt diaphoretic.   HPTN on scene 913'W systolic

## 2017-04-12 NOTE — Progress Notes (Signed)
Placed patient on BIPAP with IPAP set at 20cm and EPAP set at 10cm with oxygen set at 100%, due to increased respiratory distress.

## 2017-04-13 ENCOUNTER — Observation Stay (HOSPITAL_COMMUNITY): Payer: Medicare Other

## 2017-04-13 ENCOUNTER — Other Ambulatory Visit: Payer: Self-pay

## 2017-04-13 ENCOUNTER — Encounter (HOSPITAL_COMMUNITY): Payer: Self-pay | Admitting: General Practice

## 2017-04-13 DIAGNOSIS — R748 Abnormal levels of other serum enzymes: Secondary | ICD-10-CM | POA: Diagnosis not present

## 2017-04-13 DIAGNOSIS — E669 Obesity, unspecified: Secondary | ICD-10-CM

## 2017-04-13 DIAGNOSIS — E784 Other hyperlipidemia: Secondary | ICD-10-CM | POA: Diagnosis present

## 2017-04-13 DIAGNOSIS — J810 Acute pulmonary edema: Secondary | ICD-10-CM

## 2017-04-13 DIAGNOSIS — I160 Hypertensive urgency: Secondary | ICD-10-CM | POA: Diagnosis not present

## 2017-04-13 DIAGNOSIS — Z79899 Other long term (current) drug therapy: Secondary | ICD-10-CM | POA: Diagnosis not present

## 2017-04-13 DIAGNOSIS — Z992 Dependence on renal dialysis: Secondary | ICD-10-CM | POA: Diagnosis not present

## 2017-04-13 DIAGNOSIS — Z8249 Family history of ischemic heart disease and other diseases of the circulatory system: Secondary | ICD-10-CM

## 2017-04-13 DIAGNOSIS — Z21 Asymptomatic human immunodeficiency virus [HIV] infection status: Secondary | ICD-10-CM | POA: Diagnosis present

## 2017-04-13 DIAGNOSIS — I10 Essential (primary) hypertension: Secondary | ICD-10-CM | POA: Diagnosis not present

## 2017-04-13 DIAGNOSIS — Z820 Family history of epilepsy and other diseases of the nervous system: Secondary | ICD-10-CM | POA: Diagnosis not present

## 2017-04-13 DIAGNOSIS — Z836 Family history of other diseases of the respiratory system: Secondary | ICD-10-CM

## 2017-04-13 DIAGNOSIS — J9601 Acute respiratory failure with hypoxia: Secondary | ICD-10-CM | POA: Diagnosis not present

## 2017-04-13 DIAGNOSIS — I132 Hypertensive heart and chronic kidney disease with heart failure and with stage 5 chronic kidney disease, or end stage renal disease: Secondary | ICD-10-CM | POA: Diagnosis not present

## 2017-04-13 DIAGNOSIS — N186 End stage renal disease: Secondary | ICD-10-CM | POA: Diagnosis not present

## 2017-04-13 DIAGNOSIS — F17210 Nicotine dependence, cigarettes, uncomplicated: Secondary | ICD-10-CM | POA: Diagnosis present

## 2017-04-13 DIAGNOSIS — Z7982 Long term (current) use of aspirin: Secondary | ICD-10-CM | POA: Diagnosis not present

## 2017-04-13 DIAGNOSIS — I5033 Acute on chronic diastolic (congestive) heart failure: Secondary | ICD-10-CM | POA: Diagnosis not present

## 2017-04-13 DIAGNOSIS — B20 Human immunodeficiency virus [HIV] disease: Secondary | ICD-10-CM | POA: Diagnosis not present

## 2017-04-13 DIAGNOSIS — I161 Hypertensive emergency: Secondary | ICD-10-CM | POA: Diagnosis not present

## 2017-04-13 DIAGNOSIS — Z87891 Personal history of nicotine dependence: Secondary | ICD-10-CM | POA: Diagnosis not present

## 2017-04-13 DIAGNOSIS — Z825 Family history of asthma and other chronic lower respiratory diseases: Secondary | ICD-10-CM | POA: Diagnosis not present

## 2017-04-13 DIAGNOSIS — I340 Nonrheumatic mitral (valve) insufficiency: Secondary | ICD-10-CM

## 2017-04-13 DIAGNOSIS — D631 Anemia in chronic kidney disease: Secondary | ICD-10-CM | POA: Diagnosis present

## 2017-04-13 DIAGNOSIS — Z9989 Dependence on other enabling machines and devices: Secondary | ICD-10-CM

## 2017-04-13 DIAGNOSIS — I501 Left ventricular failure: Secondary | ICD-10-CM | POA: Diagnosis not present

## 2017-04-13 DIAGNOSIS — G40909 Epilepsy, unspecified, not intractable, without status epilepticus: Secondary | ICD-10-CM | POA: Diagnosis present

## 2017-04-13 DIAGNOSIS — R0600 Dyspnea, unspecified: Secondary | ICD-10-CM | POA: Diagnosis not present

## 2017-04-13 DIAGNOSIS — Z833 Family history of diabetes mellitus: Secondary | ICD-10-CM

## 2017-04-13 DIAGNOSIS — D649 Anemia, unspecified: Secondary | ICD-10-CM | POA: Diagnosis not present

## 2017-04-13 DIAGNOSIS — I509 Heart failure, unspecified: Secondary | ICD-10-CM | POA: Diagnosis not present

## 2017-04-13 DIAGNOSIS — I272 Pulmonary hypertension, unspecified: Secondary | ICD-10-CM | POA: Diagnosis present

## 2017-04-13 DIAGNOSIS — I5030 Unspecified diastolic (congestive) heart failure: Secondary | ICD-10-CM | POA: Diagnosis not present

## 2017-04-13 LAB — TROPONIN I: Troponin I: 0.04 ng/mL (ref <0.03)

## 2017-04-13 LAB — CBC
HEMATOCRIT: 28.9 % — AB (ref 39.0–52.0)
HEMOGLOBIN: 8.9 g/dL — AB (ref 13.0–17.0)
MCH: 28.3 pg (ref 26.0–34.0)
MCHC: 30.8 g/dL (ref 30.0–36.0)
MCV: 92 fL (ref 78.0–100.0)
Platelets: 162 10*3/uL (ref 150–400)
RBC: 3.14 MIL/uL — AB (ref 4.22–5.81)
RDW: 17.3 % — ABNORMAL HIGH (ref 11.5–15.5)
WBC: 10.1 10*3/uL (ref 4.0–10.5)

## 2017-04-13 LAB — BASIC METABOLIC PANEL
Anion gap: 10 (ref 5–15)
BUN: 17 mg/dL (ref 6–20)
CHLORIDE: 98 mmol/L — AB (ref 101–111)
CO2: 28 mmol/L (ref 22–32)
Calcium: 8.9 mg/dL (ref 8.9–10.3)
Creatinine, Ser: 7.45 mg/dL — ABNORMAL HIGH (ref 0.61–1.24)
GFR calc Af Amer: 9 mL/min — ABNORMAL LOW (ref >60)
GFR calc non Af Amer: 8 mL/min — ABNORMAL LOW (ref >60)
Glucose, Bld: 71 mg/dL (ref 65–99)
POTASSIUM: 3.6 mmol/L (ref 3.5–5.1)
SODIUM: 136 mmol/L (ref 135–145)

## 2017-04-13 LAB — ECHOCARDIOGRAM COMPLETE
HEIGHTINCHES: 74 in
Weight: 3855.4 oz

## 2017-04-13 MED ORDER — HEPARIN SODIUM (PORCINE) 1000 UNIT/ML DIALYSIS
6000.0000 [IU] | Freq: Once | INTRAMUSCULAR | Status: AC
  Administered 2017-04-13: 6000 [IU] via INTRAVENOUS_CENTRAL
  Filled 2017-04-13: qty 6

## 2017-04-13 MED ORDER — OXYCODONE HCL 5 MG PO TABS
ORAL_TABLET | ORAL | Status: AC
  Administered 2017-04-13: 10 mg via ORAL
  Filled 2017-04-13: qty 2

## 2017-04-13 MED ORDER — IPRATROPIUM-ALBUTEROL 0.5-2.5 (3) MG/3ML IN SOLN: 3.0000 mL | Freq: Four times a day (QID) | RESPIRATORY_TRACT | Status: DC | PRN

## 2017-04-13 MED ORDER — DOXAZOSIN MESYLATE 8 MG PO TABS
8.0000 mg | ORAL_TABLET | Freq: Every day | ORAL | Status: DC
  Administered 2017-04-13 – 2017-04-14 (×2): 8 mg via ORAL
  Filled 2017-04-13 (×3): qty 1

## 2017-04-13 MED ORDER — ACETAMINOPHEN 325 MG PO TABS
650.0000 mg | ORAL_TABLET | Freq: Four times a day (QID) | ORAL | Status: DC | PRN
  Administered 2017-04-13: 650 mg via ORAL
  Filled 2017-04-13: qty 2

## 2017-04-13 MED ORDER — TENOFOVIR DISOPROXIL FUMARATE 300 MG PO TABS: 300.0000 mg | ORAL_TABLET | ORAL | Status: DC

## 2017-04-13 MED ORDER — CARVEDILOL 25 MG PO TABS
25.0000 mg | ORAL_TABLET | Freq: Two times a day (BID) | ORAL | Status: DC
  Administered 2017-04-13 – 2017-04-14 (×3): 25 mg via ORAL
  Filled 2017-04-13 (×3): qty 1

## 2017-04-13 MED ORDER — LIDOCAINE HCL (PF) 1 % IJ SOLN: 5.0000 mL | INTRAMUSCULAR | Status: DC | PRN

## 2017-04-13 MED ORDER — HEPARIN SODIUM (PORCINE) 1000 UNIT/ML DIALYSIS
3000.0000 [IU] | INTRAMUSCULAR | Status: DC | PRN
  Filled 2017-04-13: qty 3

## 2017-04-13 MED ORDER — ALTEPLASE 2 MG IJ SOLR: 2.0000 mg | Freq: Once | INTRAMUSCULAR | Status: DC | PRN

## 2017-04-13 MED ORDER — FERRIC CITRATE 1 GM 210 MG(FE) PO TABS
420.0000 mg | ORAL_TABLET | Freq: Two times a day (BID) | ORAL | Status: DC
  Administered 2017-04-13 – 2017-04-15 (×4): 420 mg via ORAL
  Filled 2017-04-13 (×6): qty 2

## 2017-04-13 MED ORDER — PRAVASTATIN SODIUM 40 MG PO TABS
40.0000 mg | ORAL_TABLET | Freq: Every evening | ORAL | Status: DC
  Administered 2017-04-13 – 2017-04-14 (×2): 40 mg via ORAL
  Filled 2017-04-13 (×2): qty 1

## 2017-04-13 MED ORDER — DOLUTEGRAVIR SODIUM 50 MG PO TABS
50.0000 mg | ORAL_TABLET | Freq: Every day | ORAL | Status: DC
  Administered 2017-04-14 – 2017-04-15 (×2): 50 mg via ORAL
  Filled 2017-04-13 (×2): qty 1

## 2017-04-13 MED ORDER — OXYCODONE HCL 5 MG PO TABS
10.0000 mg | ORAL_TABLET | Freq: Once | ORAL | Status: AC
  Administered 2017-04-13: 10 mg via ORAL

## 2017-04-13 MED ORDER — DOLUTEGRAVIR SODIUM 50 MG PO TABS
50.0000 mg | ORAL_TABLET | Freq: Every day | ORAL | Status: DC
  Administered 2017-04-13: 50 mg via ORAL
  Filled 2017-04-13: qty 1

## 2017-04-13 MED ORDER — AMLODIPINE BESYLATE 10 MG PO TABS
10.0000 mg | ORAL_TABLET | Freq: Every day | ORAL | Status: DC
  Administered 2017-04-13 – 2017-04-15 (×3): 10 mg via ORAL
  Filled 2017-04-13 (×3): qty 1

## 2017-04-13 MED ORDER — SODIUM CHLORIDE 0.9 % IV SOLN: 100.0000 mL | INTRAVENOUS | Status: DC | PRN

## 2017-04-13 MED ORDER — LIDOCAINE-PRILOCAINE 2.5-2.5 % EX CREA: 1.0000 "application " | TOPICAL_CREAM | CUTANEOUS | Status: DC | PRN

## 2017-04-13 MED ORDER — HEPARIN SODIUM (PORCINE) 1000 UNIT/ML DIALYSIS
1000.0000 [IU] | INTRAMUSCULAR | Status: DC | PRN
  Filled 2017-04-13: qty 1

## 2017-04-13 MED ORDER — BUPROPION HCL 75 MG PO TABS
37.5000 mg | ORAL_TABLET | Freq: Two times a day (BID) | ORAL | Status: DC
  Administered 2017-04-13 – 2017-04-15 (×5): 37.5 mg via ORAL
  Filled 2017-04-13 (×5): qty 1

## 2017-04-13 MED ORDER — HYDRALAZINE HCL 50 MG PO TABS
50.0000 mg | ORAL_TABLET | Freq: Three times a day (TID) | ORAL | Status: DC
Start: 2017-04-13 — End: 2017-04-14
  Administered 2017-04-13 (×2): 50 mg via ORAL
  Filled 2017-04-13 (×2): qty 1

## 2017-04-13 MED ORDER — HYDRALAZINE HCL 25 MG PO TABS
25.0000 mg | ORAL_TABLET | Freq: Three times a day (TID) | ORAL | Status: DC
  Administered 2017-04-13: 25 mg via ORAL
  Filled 2017-04-13: qty 1

## 2017-04-13 MED ORDER — PENTAFLUOROPROP-TETRAFLUOROETH EX AERO
1.0000 "application " | INHALATION_SPRAY | CUTANEOUS | Status: DC | PRN
  Filled 2017-04-13: qty 30

## 2017-04-13 MED ORDER — ALBUTEROL SULFATE (2.5 MG/3ML) 0.083% IN NEBU: 2.5000 mg | INHALATION_SOLUTION | RESPIRATORY_TRACT | Status: DC | PRN

## 2017-04-13 MED ORDER — RILPIVIRINE HCL 25 MG PO TABS
25.0000 mg | ORAL_TABLET | Freq: Every day | ORAL | Status: DC
  Administered 2017-04-13 – 2017-04-15 (×3): 25 mg via ORAL
  Filled 2017-04-13 (×5): qty 1

## 2017-04-13 NOTE — Consult Note (Addendum)
   [facility] Inpatient Consult   04/13/2017  EMRE STOCK 1965-07-31 [id number]    Mr. Mathey is active with [place] [facility] program.  Please see chart review then encounters for further patient outreach details.  Spoke with Mr. Monier at bedside. He is currently on stepdown unit. He is agreeable to ongoing [place] [facility] services.   Mr. Leaming endorses that he has been told that he has phone issues. He states "I really need to get a new phone because mine is cracked pretty bad".  Confirmed Mr. Statler' cell as [phone].   Mr. Sermersheim states his wife, Dorris Vangorder, can be contacted if he can not be reached. [name] is listed on [place] [facility] consent as well. Roderic Palau [phone].  Made inpatient RNCM aware that [place] Management is active.  Will update [facility] Community team about bedside visit.  Marthenia Rolling, MSN-Ed, RN,BSN [facility] Liaison [phone]

## 2017-04-13 NOTE — Progress Notes (Addendum)
CRITICAL VALUE ALERT  Critical Value:  Troponin 1 (0.04) Date & Time Notied:  04/13/2017  At 11:25 am  Provider Notified: Dr Frederico Hamman  Orders Received/Actions taken: no orders received.

## 2017-04-13 NOTE — Progress Notes (Signed)
Patient arrived to unit per ED stretcher.  Reviewed treatment plan and this RN agrees.  Report received from bedside RN, Nicki Reaper.  Consent obtained.  Patient A & O X 4. Lung sounds diminished and coarse to ausculation in all fields. BLE 1+ pitting edema. Cardiac: NSR.  Prepped RLAVF with alcohol and cannulated with two 15 gauge needles.  Pulsation of blood noted.  Flushed access well with saline per protocol.  Connected and secured lines and initiated tx at 0307.  UF goal of 4500 mL and net fluid removal of 4000 mL.  Will continue to monitor.

## 2017-04-13 NOTE — ED Notes (Signed)
Nitro infusion stopped per nephrology.

## 2017-04-13 NOTE — Progress Notes (Signed)
04/13/2017 patient came from the emergency room to Dialysis and came to [place] at 0733. He is alert, oriented and  Ambulatory. Patient have a incision on the right groin, stated he had a abscess there. The incision is pink and there is no drainage. Fistula on left lower arm and is positive. Receive CHG bath and place on telemetry. [facility].

## 2017-04-13 NOTE — Progress Notes (Signed)
   Subjective:  No acute events overnight. Patient states he is feeling better and shortness of breath is improved. Taken off BiPAP during encounter and placed on 4L O2 by [place] with no hypoxia noted. Per patient, he has been having drainage of his sinus recently that he believes caused his acute onset shortness of breath. He also states his dry weight was changed to 107 kg on previous admission but not at outpatient HD center.   Objective:  Vital signs in last 24 hours: Vitals:   04/13/17 0500 04/13/17 0530 04/13/17 0545 04/13/17 0600  BP: 139/73 131/70 130/68 127/70  Pulse: 79 79 80 67  Resp:      Temp:      TempSrc:      SpO2:      Weight:      Height:       General: pleasant male, obese, well-developed, wearing BiPAP in bed in no acute distress, able to speak in full sentences  Cardiac: regular rate and rhythm, nl S1/S2, no murmurs, rubs or gallops  Pulm: diffuse coarse breath sounds with basilar expiratory wheezes noted, no increased work of breathing  Abd: soft, NTND, bowel sounds present  Neuro: A&Ox3,  Able to move all 4 extremities, no focal deficits noted  Ext: warm and well perfused, no peripheral edema, 2+ DP pulses bilaterally     Assessment/Plan:  Darrell Johnson is a 51 y.o male with history of well-controlled HIV, ESRD on HD, HFpEF EF 55%, and HTN who presents with acute onset of shortness of breath in the setting of significantly elevated BP concerning for flash pulmonary edema vs volume overload.    # Acute hypoxic respiratory failure: secondary to flash pulmonary edema in the setting of elevated BP vs volume overload. Multiple admissions for flash pulmonary edema this year. He had a HD session overnight. Taken off BiPAP this morning and satting well on 4L of O2 by [place]. Continues to have wheezing and coarse breath sounds on exam but hemodynamically stable. He is currently normotensive.   - Consult cardiology per nephrology recommendations  - TTE  - Wean O2 as able    - Plan for extra HD session tomorrow   # Troponinemia: Initial I-stat troponin negative and now uptrending. Likely secondary to demand ischemia in the setting of significantly elevated BP.  - Will continue to trend troponin   # ESRD on HD: HD MWF. Nephrology consulted and HD session overnight with uf goal of 3-4 L - Nephrology following and plan for HD tomorrow    # HFpEF: TTE with EF 55%, no regional wall motion abnormalities, mild concentric hypertrophy. Dry weight 236lbs.  - Continue home Coreg 25 mg BID + Hydralazine 25mg  TID + ASA 8z1 mg QD  - Repeat TTE as above and cardiology consult   # HIV: Diagnosed 1980. Well-controlled. CD4=600 and RNA quant=28. Last saw [name] with ID on 10/01/2016.  -Continue Tivicay 50mg , edurant 25mg , and viread 300mg     # HTN: much improved this morning with sBP 120s-130s.  -Continue home amlodipine 10 mg QD + carvedilol 25mg  BID + hydralazine 25mg  TID   F: None  E: will continue to monitor  N: NPO while on BiPAP-->Renal diet   VTE ppx: SQ heparin   Code status: Full code, not confirmed   Dispo: Anticipated discharge in approximately 2-3 day(s).   Welford Roche, MD  Internal Medicine PGY-1  P [phone]

## 2017-04-13 NOTE — Progress Notes (Signed)
Patient ID: GRAIDEN HENES, male   DOB: Jul 01, 1966, 51 y.o.   MRN: [id number]  [facility] Progress Note    Subjective:   Feels better   Objective:   BP (!) 153/86   Pulse 80   Temp 98 F (36.7 C) (Oral)   Resp (!) 23   Ht 6\' 2"  (1.88 m)   Wt 109.3 kg (240 lb 15.4 oz) Comment: reported floor weight  SpO2 100%   BMI 30.94 kg/m   Intake/Output: I/O last 3 completed shifts: In: -  Out: 3900 [Other:3900]   Intake/Output this shift:  No intake/output data recorded. Weight change:   Physical Exam: Gen:WD NAD CVS:no rub Resp:CTA RFX:JOITGP Ext: trace edema, RAVG +T/B  Labs: BMET  Recent Labs Lab 04/10/17 0403 04/12/17 2100  NA 139 137  K 4.0 4.7  CL 102 101  CO2  --  25  GLUCOSE 84 132*  BUN 32* 35*  CREATININE 10.90* 12.14*  ALBUMIN  --  3.7  CALCIUM  --  9.6   CBC  Recent Labs Lab 04/10/17 0403 04/12/17 2100  WBC  --  18.2*  NEUTROABS  --  13.9*  HGB 8.5* 9.3*  HCT 25.0* 30.8*  MCV  --  93.1  PLT  --  187    @IMGRELPRIORS @ Medications:    . amLODipine  10 mg Oral Daily  . aspirin  324 mg Oral Once  . buPROPion  37.5 mg Oral BID  . carvedilol  25 mg Oral BID WC  . dolutegravir  50 mg Oral Daily  . doxazosin  8 mg Oral QHS  . ferric citrate  420 mg Oral BID WC  . hydrALAZINE  25 mg Oral TID  . pravastatin  40 mg Oral QPM  . rilpivirine  25 mg Oral Q breakfast  . [START ON 04/19/2017] tenofovir  300 mg Oral Weekly   [facility]  4h 75min 2/2 bath Hep 6000 >3000 F200 RUA AVF Dry wt 107kg   Assessment/ Plan:   1. Acute respiratory failure/flash pulmonary edema.  Was only 2kg above edw so unsure why he had significant hypoxia and respiratory distress.  Will continue to challenge edw, however he will need an official cardiac workup given recurrent issues with acute respiratory distress/pulmonary edema (3 admissions this month) 1. Recommend ECHO and Cards consult 2. ESRD s/p HD early this morning.  Will plan  for extra treatment tomorrow and follow 3. Anemia: of chronic kidney disease.  Low TSAT, on ESA.  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 Nutrition: renal diet 6. Hypertension: will challenge edw as above  Donetta Potts, MD [place] Pager [phone] 04/13/2017, 10:29 AM

## 2017-04-13 NOTE — Consult Note (Signed)
Cardiology Consultation:   Patient ID: Darrell Johnson; [id number]; 1965/10/11   Admit date: 04/12/2017 Date of Consult: 04/13/2017  Primary Care Provider: Jule Ser, DO Primary Cardiologist: New   Patient Profile:   Darrell Johnson is a 51 y.o. male with a hx of HIV, ESRD on dialysis, CHF, essential hypertension who is being seen today for the evaluation of pulmonary edema at the request of Dr. Lynnae January.  History of Present Illness:   [name] was recently admitted to the hospital 9/9-9/11 for acute respiratory failure and significantly elevated blood pressure. He was treated and dialyzed and discharged home on room air.  He again presented to the ED on 04/10/17 with complaints of shortness of breath and was discharged home from the ED. Yesterday he was fine during the day. He ate dinner and after dinner he had some sinus drainage and coughing. He then walked out to his truck to run an errand and felt a little short of breath. He rode around a while then went back home. Once inside he became more short of breath and ended up calling 911. He was given a neb tx that he says did not help. He was put on BiPap and was breathing better by the time they took him to dialysis and his breathing improved even more with dialysis. He denies having had any chest discomfort throughout his breathing difficulties. Today he has no shortness of breath.   He has no previous hx of cardiac events. He says that heart failure has been mentioned in the past but he does not now what that means. He has been a smoker, about 5 cigarettes per day, trying to quit and down to 1 cigarette per day. He does not drink alcohol and denies drug use.   Chest xray was consistent with CHF. Superimposed pneumonia would be difficult to entirely exclude as with bilateral layering pleural effusions.  Troponin is mildly elevated consistent with ESRD.  He recently had a stress echocardiogram which showed poor exercise tolerance  and an echocardiogram that showed a normal EF of greater than 55%, mild LVH, normal RV systolic function, moderate MR. This testing was done at [facility] and was ordered by the kidney transplant team. He was on the transplant list, but was removed due to abnormal findings on his stress echo. He is planned to have a nuclear scan for his further transplant workup.   He had a low risk nuclear stress test in 11/2015  Past Medical History:  Diagnosis Date  . Anemia   . Arthritis   . Asthma   . CHF (congestive heart failure) ([place])   . Chronic kidney disease    stage 3-4 CKD, followed by Dr. Moshe Cipro  . ESRD (end stage renal disease) ([place])    ESRD due to HTN started dialysis Feb 2016  . Gout   . Gout, unspecified 08/13/2009   Qualifier: Diagnosis of  By: Redmond Pulling  MD, Mateo Flow    . H1N1 influenza    March 2016  . History of gout   . History of syphilis   . HIV (human immunodeficiency virus infection) ([place])   . HIV infection ([place]) 1980's   on ART therapy since, followed by ID clinic, complicated  by neuropathy  . HTN (hypertension)   . Hyperlipidemia    hypertrygliceridemia determined ti be secondary to ART therpay  . Hypertension   . HYPERTENSION 05/08/2006  . 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 Pneumonia   . Rib fractures 01/2009  . Seizure  disorder ([place])   . Seizures ([place])    last seizure was >5 years ago, pt has family history of seizures  . 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 Syphilis 1997   history of syphilis 1997  . Tobacco abuse     Past Surgical History:  Procedure Laterality Date  . ABSCESS DRAINAGE    . AV FISTULA PLACEMENT Right 12/02/2013   Procedure: ARTERIOVENOUS (AV) FISTULA CREATION;  Surgeon: Rosetta Posner, MD;  Location: [facility];  Service: Vascular;  Laterality: Right;  . INCISION AND DRAINAGE ABSCESS Right 03/09/2017    Procedure: INCISION AND DRAINAGE Right Scrotal Abscess;  Surgeon: Franchot Gallo, MD;  Location: [facility];  Service: Urology;  Laterality: Right;     Home Medications:  Prior to Admission medications   Medication Sig Start Date End Date Taking? Authorizing Provider  acetaminophen (TYLENOL) 500 MG tablet Take 1,000 mg by mouth every 6 (six) hours as needed for mild pain.   Yes [provider]  albuterol (PROVENTIL HFA;VENTOLIN HFA) 108 (90 Base) MCG/ACT inhaler Inhale 2 puffs into the lungs every 6 (six) hours as needed for wheezing or shortness of breath. 04/06/17  Yes Lacroce, Hulen Shouts, MD  albuterol (PROVENTIL) (2.5 MG/3ML) 0.083% nebulizer solution Take 3 mLs (2.5 mg total) by nebulization every 6 (six) hours as needed for wheezing or shortness of breath. 09/04/16  Yes Jule Ser, DO  amLODipine (NORVASC) 5 MG tablet Take 2 tablets (10 mg total) by mouth daily. 04/06/17 04/06/18 Yes Lacroce, Hulen Shouts, MD  buPROPion [facility]) 75 MG tablet TAKE 1/2 TABLET TWICE A DAY 02/10/17  Yes Jule Ser, DO  carvedilol (COREG) 25 MG tablet Take 1 tablet (25 mg total) by mouth 2 (two) times daily with a meal. 04/06/17  Yes Lacroce, Hulen Shouts, MD  doxazosin (CARDURA) 8 MG tablet Take 1 tablet (8 mg total) by mouth at bedtime. 04/06/17  Yes Lacroce, Hulen Shouts, MD  EDURANT 25 MG TABS tablet TAKE 1 TABLET (25 MG TOTAL) BY MOUTH DAILY WITH BREAKFAST. 12/22/16  Yes Carlyle Basques, MD  ferric citrate (AURYXIA) 1 GM 210 MG(Fe) tablet Take 420 mg by mouth 2 (two) times daily with a meal.    Yes [provider]  furosemide (LASIX) 40 MG tablet Take 1 tablet (40 mg total) by mouth as needed for fluid or edema. 04/06/17  Yes Lacroce, Hulen Shouts, MD  hydrALAZINE (APRESOLINE) 25 MG tablet Take 1 tablet (25 mg total) by mouth 3 (three) times daily. 04/06/17  Yes Lacroce, Hulen Shouts, MD  montelukast (SINGULAIR) 10 MG tablet Take 1 tablet (10 mg total) by mouth at bedtime. 12/17/16  Yes Jule Ser, DO  pravastatin (PRAVACHOL) 40 MG tablet TAKE 1 TABLET (40 MG TOTAL) BY MOUTH EVERY EVENING. 01/27/17 01/27/18 Yes Jule Ser, DO  TIVICAY 50 MG tablet TAKE 1 TABLET (50 MG TOTAL) BY MOUTH DAILY. 12/22/16  Yes Carlyle Basques, MD  VIREAD 300 MG tablet TAKE 1 TABLET BY MOUTH ONCE A WEEK Patient taking differently: TAKE 1 TABLET BY MOUTH ONCE A WEEK ON SUNDAYS 01/06/17  Yes Carlyle Basques, MD    Inpatient Medications: Scheduled Meds: . amLODipine  10 mg Oral Daily  . aspirin  324 mg Oral Once  . buPROPion  37.5 mg Oral BID  . carvedilol  25 mg Oral BID WC  . [START ON  04/14/2017] dolutegravir  50 mg Oral Daily  . doxazosin  8 mg Oral QHS  . ferric citrate  420 mg Oral BID WC  . hydrALAZINE  25 mg Oral TID  . pravastatin  40 mg Oral QPM  . rilpivirine  25 mg Oral Q breakfast  . [START ON 04/19/2017] tenofovir  300 mg Oral Weekly   Continuous Infusions: . sodium chloride    . sodium chloride     PRN Meds: sodium chloride, sodium chloride, albuterol, alteplase, heparin, heparin, lidocaine (PF), lidocaine-prilocaine, pentafluoroprop-tetrafluoroeth  Allergies:   No Known Allergies  Social History:   Social History   Social History  . Marital status: Married    Spouse name: N/A  . Number of children: N/A  . Years of education: N/A   Occupational History  . Not on file.   Social History Main Topics  . Smoking status: Former Smoker    Packs/day: 0.40    Years: 20.00    Start date: 01/10/2016    Quit date: 04/07/2016  . Smokeless tobacco: Never Used  . Alcohol use No  . Drug use: No  . Sexual activity: Not on file     Comment: given condoms   Other Topics Concern  . Not on file   Social History Narrative   ** Merged History Encounter **        Family History:    Family History  Problem Relation Age of Onset  . Cancer Mother   . Hypertension Mother   . COPD Father   . Hypertension Father   . Diabetes Sister   . Hypertension Sister   . Diabetes Brother     . Hypertension Brother   . Stroke Neg Hx      ROS:  Please see the history of present illness.  ROS  All other ROS reviewed and negative.     Physical Exam/Data:   Vitals:   04/13/17 0545 04/13/17 0600 04/13/17 0637 04/13/17 0820  BP: 130/68 127/70 (!) 146/82 (!) 153/86  Pulse: 80 67 80 80  Resp:   (!) 23 (!) 23  Temp:   98 F (36.7 C)   TempSrc:   Oral   SpO2:   100% 100%  Weight:      Height:        Intake/Output Summary (Last 24 hours) at 04/13/17 1258 Last data filed at 04/13/17 6789  Gross per 24 hour  Intake                0 ml  Output             3900 ml  Net            -3900 ml   Filed Weights   04/12/17 2112 04/12/17 2309 04/13/17 0250  Weight: 240 lb (108.9 kg) 241 lb (109.3 kg) 240 lb 15.4 oz (109.3 kg)   Body mass index is 30.94 kg/m.  General:  Well nourished, well developed, in no acute distress HEENT: normal Lymph: no adenopathy Neck: no JVD Endocrine:  No thryomegaly Vascular: No carotid bruits; FA pulses 2+ bilaterally without bruits  Cardiac:  normal S1, S2; RRR; 2/6 systolic murmur Lungs:  Scattered rhonchi, faint inspiratory and expiratory wheezes Abd: soft, nontender, no hepatomegaly  Ext: no edema Musculoskeletal:  No deformities, BUE and BLE strength normal and equal Skin: warm and dry  Neuro:  CNs 2-12 intact, no focal abnormalities noted Psych:  Normal affect   EKG:  The EKG was personally reviewed and  demonstrates: Sinus tachycardia at 114 bpm, possible LAE, no acute changes Telemetry:  Telemetry was personally reviewed and demonstrates:  NSR  Relevant CV Studies:  Stress Echocardiogram 04/07/17 at [place].  NO DOPPLER PERFORMED FOR VALVULAR REGURGITATION  NO DOPPLER PERFORMED FOR VALVULAR STENOSIS  Note: NORMAL RESTING STUDY WITH NO WALL MOTION ABNORMALITIES AT REST AND  LESS THAN TARGET STRESS  PATIENT DEVELOPED SOB WITH STRESS. POOR EXERCISE TOLERANCE.  Maximum workload of4.60 METs  was achieved during exercise.  RESTING HYPERTENSION - EXAGGERATED RESPONSE   Echocardiogram 04/07/17 at [name] EF >55% NORMAL LEFT VENTRICULAR SYSTOLIC FUNCTION WITH MILD LVH  NORMAL RIGHT VENTRICULAR SYSTOLIC FUNCTION  VALVULAR REGURGITATION: MODERATE MR, TRIVIAL PR, TRIVIAL TR  NO VALVULAR STENOSIS  Compared with prior Echo study on 12/13/2014: MR INCREASED   Nuclear stress test 11/21/2015 Study Highlights   Nuclear stress EF: 57%.  There was no ST segment deviation noted during stress.  The study is normal.  This is a low risk study.  The left ventricular ejection fraction is normal (55-65%).   Appears to be LVE.  No ischemia or infarction EF 57%      Laboratory Data:  Chemistry Recent Labs Lab 04/10/17 0403 04/12/17 2100 04/13/17 0926  NA 139 137 136  K 4.0 4.7 3.6  CL 102 101 98*  CO2  --  25 28  GLUCOSE 84 132* 71  BUN 32* 35* 17  CREATININE 10.90* 12.14* 7.45*  CALCIUM  --  9.6 8.9  GFRNONAA  --  4* 8*  GFRAA  --  5* 9*  ANIONGAP  --  11 10     Recent Labs Lab 04/12/17 2100  PROT 7.4  ALBUMIN 3.7  AST 28  ALT 17  ALKPHOS 114  BILITOT 0.8   Hematology Recent Labs Lab 04/10/17 0403 04/12/17 2100 04/13/17 0926  WBC  --  18.2* 10.1  RBC  --  3.31* 3.14*  HGB 8.5* 9.3* 8.9*  HCT 25.0* 30.8* 28.9*  MCV  --  93.1 92.0  MCH  --  28.1 28.3  MCHC  --  30.2 30.8  RDW  --  17.1* 17.3*  PLT  --  187 162   Cardiac Enzymes Recent Labs Lab 04/13/17 0926  TROPONINI 0.04*    Recent Labs Lab 04/10/17 0401  TROPIPOC 0.02    BNP Recent Labs Lab 04/12/17 2100  BNP 830.9*    DDimer No results for input(s): DDIMER in the last 168 hours.  Radiology/Studies:  Dg Chest 2 View  Result Date: 04/09/2017 CLINICAL DATA:  Shortness of breath and tachycardia beginning this morning. Congestive heart failure. End-stage renal disease. EXAM: CHEST  2 VIEW COMPARISON:  03/29/2017 FINDINGS: Stable mild cardiomegaly. Decreased bilateral  pulmonary airspace disease since previous study, consistent with decreased pulmonary edema. Stable right pleural thickening versus small pleural effusion. IMPRESSION: Interval decrease in diffuse pulmonary edema since prior study. Stable right pleural thickening versus small effusion. Electronically Signed   By: Earle Gell M.D.   On: 04/09/2017 17:39   Dg Chest Portable 1 View  Result Date: 04/12/2017 CLINICAL DATA:  Dyspnea, dialysis patient for 2 years. EXAM: PORTABLE CHEST 1 VIEW COMPARISON:  None. FINDINGS: Stable cardiomegaly. Chronic blunting of the right costophrenic angle laterally may reflect pleural thickening. No aortic aneurysm. New confluent airspace opacities, predominantly within the mid to lower lung are seen bilaterally with pulmonary vascular redistribution consistent with CHF. Bilateral superimposed pneumonia would be difficult to entirely exclude at the lung bases. No  acute nor suspicious osseous abnormality. IMPRESSION: Cardiomegaly with bilateral airspace opacities consistent with CHF. Superimposed pneumonia would be difficult to entirely exclude as with bilateral layering pleural effusions. Electronically Signed   By: Ashley Royalty M.D.   On: 04/12/2017 21:43    Assessment and Plan:   Pulmonary edema:  The patient presented for acute hypoxic respiratory failure and taken right to dialysis. He required BiPAP but is now improved and on oxygen by nasal cannula. The patient had been compliant with his dialysis and had not missed any sessions. Chest xray was consistent with CHF. Superimposed pneumonia would be difficult to entirely exclude as with bilateral layering pleural effusions. Pt was taken to dialysis from the ED last night. Lung sounds with scattered rhonchi, few crackles and faint wheezes. Breathing much improved after BiPap and dialysis.  Further recommendations to follow when seen by Dr Oval Linsey.  Acute on Chronic diastolic heart failure: No hx of cardiac events. Does not see  a cardiologist. Recent echocardiogram on 04/07/17 done at [facility] for renal transplant workup showed EF>55%, normal right ventricular systolic function and moderate MR. BNP was 830.9 on admission. Fluid status managed with hemodialysis. Pt has no previous history of cardiac events. He is set up for nuclear stress test at [facility] as follow up for abnormalities seen on stress echo.   ESRD on HD: HD MWF. Nephrology's consultation and the patient had a dialysis session last night after presenting with acute respiratory failure. Nephrology is following and there is plan for HD tomorrow.   Hypertension: Managed at home with amlodipine 10 mg daily, carvedilol 25 mg twice a day, Cardura 8 mg daily at bedtime, hydralazine 25 mg 3 times a day. These medications are continued inpatient. Today blood pressure is normal to mildly elevated   For questions or updates, please contact [place] Please consult [email or web address] for contact info under Cardiology/STEMI.   Signed, Daune Perch, NP  04/13/2017 12:58 PM

## 2017-04-13 NOTE — Progress Notes (Signed)
RT note: RT asked to take patient off of bipap and place on nasal cannula. Patient breath sounds are clear bilterally, patient is in no distress, work of breathing is regular, unlabored, and symmetrical. Patient vitals are stable, 4LNC, sat 100%. Per patient home regimen PRN treatments are being ordered. RT will continue to monitor.

## 2017-04-13 NOTE — Progress Notes (Signed)
Transported pt from dialysis to 3M09 on BIPAP

## 2017-04-13 NOTE — Progress Notes (Signed)
  Echocardiogram 2D Echocardiogram has been performed.  Darrell Johnson 04/13/2017, 3:08 PM

## 2017-04-13 NOTE — Consult Note (Signed)
Renal Service Consult Note [facility]  Darrell Johnson 04/13/2017 [place] [address] Requesting Physician:  ED physician  Reason for Consult:  ESRD pt with acute resp distress HPI: The patient is a 51 y.o. year-old with hx of HTN , CHF, gout, HL, HIV , seizure d/o presenting to ED this evening with resp distress.  Started 30 min before calling EMS.  +associated chest pressure.  Put on bipap and started on IV NTG in the ED.  BP was 212/104 on arrival and now is down to 120/82.  Pt's RR was 42 and now is 20.  He is feeling much better. Asked to see for ESRD.    Pt was here early Sept for vol overload and suspected lean body wt loss.  Left at 111kg w/ plans to lower dry wt further in OP setting.    Tonight pt provides limited hx due to bipap.  No active CP at this time.  No abd pain, no recent n/v/d.       ROS  denies CP  no joint pain   no HA  no blurry vision  no rash  no diarrhea  no nausea/ vomiting   Past Medical History  Past Medical History:  Diagnosis Date  . Anemia   . Arthritis   . Asthma   . CHF (congestive heart failure) ([place])   . Chronic kidney disease    stage 3-4 CKD, followed by Dr. Moshe Cipro  . ESRD (end stage renal disease) ([place])    ESRD due to HTN started dialysis Feb 2016  . Gout   . Gout, unspecified 08/13/2009   Qualifier: Diagnosis of  By: Redmond Pulling  MD, Mateo Flow    . H1N1 influenza    March 2016  . History of gout   . History of syphilis   . HIV (human immunodeficiency virus infection) ([place])   . HIV infection ([place]) 1980's   on ART therapy since, followed by ID clinic, complicated  by neuropathy  . HTN (hypertension)   . Hyperlipidemia    hypertrygliceridemia determined ti be secondary to ART therpay  . Hypertension   . HYPERTENSION 05/08/2006  . 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 Pneumonia   . Rib fractures 01/2009  . Seizure disorder ([place])   . Seizures ([place])    last seizure was >5 years ago, pt has family history of seizures  .  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 Syphilis 1997   history of syphilis 1997  . Tobacco abuse    Past Surgical History  Past Surgical History:  Procedure Laterality Date  . ABSCESS DRAINAGE    . AV FISTULA PLACEMENT Right 12/02/2013   Procedure: ARTERIOVENOUS (AV) FISTULA CREATION;  Surgeon: Rosetta Posner, MD;  Location: [facility];  Service: Vascular;  Laterality: Right;  . INCISION AND DRAINAGE ABSCESS Right 03/09/2017   Procedure: INCISION AND DRAINAGE Right Scrotal Abscess;  Surgeon: Franchot Gallo, MD;  Location: [facility];  Service: Urology;  Laterality: Right;   Family History  Family History  Problem Relation Age of Onset  . Cancer Mother   . Hypertension Mother   . COPD Father   . Hypertension Father   . Diabetes Sister   . Hypertension Sister   . Diabetes Brother   . Hypertension Brother   . Stroke Neg Hx    Social History  reports  that he quit smoking about a year ago. He started smoking about 15 months ago. He has a 8.00 pack-year smoking history. He has never used smokeless tobacco. He reports that he does not drink alcohol or use drugs. Allergies No Known Allergies Home medications Prior to Admission medications   Medication Sig Start Date End Date Taking? Authorizing Provider  acetaminophen (TYLENOL) 500 MG tablet Take 1,000 mg by mouth every 6 (six) hours as needed for mild pain.   Yes [provider]  albuterol (PROVENTIL HFA;VENTOLIN HFA) 108 (90 Base) MCG/ACT inhaler Inhale 2 puffs into the lungs every 6 (six) hours as needed for wheezing or shortness of breath. 04/06/17  Yes Lacroce, Hulen Shouts, MD  albuterol (PROVENTIL) (2.5 MG/3ML) 0.083% nebulizer solution Take 3 mLs (2.5 mg total) by nebulization every 6 (six) hours as needed for wheezing or shortness of breath. 09/04/16  Yes Jule Ser, DO  amLODipine  (NORVASC) 5 MG tablet Take 2 tablets (10 mg total) by mouth daily. 04/06/17 04/06/18 Yes Lacroce, Hulen Shouts, MD  buPROPion [facility]) 75 MG tablet TAKE 1/2 TABLET TWICE A DAY 02/10/17  Yes Jule Ser, DO  carvedilol (COREG) 25 MG tablet Take 1 tablet (25 mg total) by mouth 2 (two) times daily with a meal. 04/06/17  Yes Lacroce, Hulen Shouts, MD  doxazosin (CARDURA) 8 MG tablet Take 1 tablet (8 mg total) by mouth at bedtime. 04/06/17  Yes Lacroce, Hulen Shouts, MD  EDURANT 25 MG TABS tablet TAKE 1 TABLET (25 MG TOTAL) BY MOUTH DAILY WITH BREAKFAST. 12/22/16  Yes Carlyle Basques, MD  ferric citrate (AURYXIA) 1 GM 210 MG(Fe) tablet Take 420 mg by mouth 2 (two) times daily with a meal.    Yes [provider]  furosemide (LASIX) 40 MG tablet Take 1 tablet (40 mg total) by mouth as needed for fluid or edema. 04/06/17  Yes Lacroce, Hulen Shouts, MD  hydrALAZINE (APRESOLINE) 25 MG tablet Take 1 tablet (25 mg total) by mouth 3 (three) times daily. 04/06/17  Yes Lacroce, Hulen Shouts, MD  montelukast (SINGULAIR) 10 MG tablet Take 1 tablet (10 mg total) by mouth at bedtime. 12/17/16  Yes Jule Ser, DO  pravastatin (PRAVACHOL) 40 MG tablet TAKE 1 TABLET (40 MG TOTAL) BY MOUTH EVERY EVENING. 01/27/17 01/27/18 Yes Jule Ser, DO  TIVICAY 50 MG tablet TAKE 1 TABLET (50 MG TOTAL) BY MOUTH DAILY. 12/22/16  Yes Carlyle Basques, MD  VIREAD 300 MG tablet TAKE 1 TABLET BY MOUTH ONCE A WEEK Patient taking differently: TAKE 1 TABLET BY MOUTH ONCE A WEEK ON SUNDAYS 01/06/17  Yes Carlyle Basques, MD   Liver Function Tests  Recent Labs Lab 04/12/17 2100  AST 28  ALT 17  ALKPHOS 114  BILITOT 0.8  PROT 7.4  ALBUMIN 3.7   No results for input(s): LIPASE, AMYLASE in the last 168 hours. CBC  Recent Labs Lab 04/10/17 0403 04/12/17 2100  WBC  --  18.2*  NEUTROABS  --  13.9*  HGB 8.5* 9.3*  HCT 25.0* 30.8*  MCV  --  93.1  PLT  --  347   Basic Metabolic Panel  Recent Labs Lab 04/10/17 0403  04/12/17 2100  NA 139 137  K 4.0 4.7  CL 102 101  CO2  --  25  GLUCOSE 84 132*  BUN 32* 35*  CREATININE 10.90* 12.14*  CALCIUM  --  9.6   Iron/TIBC/Ferritin/ %Sat    Component Value Date/Time   IRON 20 (L) 09/28/2014 1015   TIBC 243  09/28/2014 1015   FERRITIN 590 (H) 09/28/2014 1015   IRONPCTSAT 8 (L) 09/28/2014 1015    Vitals:   04/12/17 2245 04/12/17 2300 04/12/17 2309 04/12/17 2315  BP: 119/71 126/85  120/82  Pulse: 79 79  80  Resp: 18 19  (!) 22  Temp:      TempSrc:      SpO2: 100% 100%  100%  Weight:   109.3 kg (241 lb)   Height:       Exam Gen on bipap, obese AAM, not in distress, responsive and appropriate No rash, cyanosis or gangrene Sclera anicteric, throat clear   No jvd or bruits Chest bibasilar rales  RRR no MRG Abd soft ntnd no mass or ascites +bs GU normal male MS no joint effusions or deformity Ext 1+ bilat LE edema / no wounds or ulcers Neuro is alert, Ox 3 , nf RFA AVF patent    Dialysis: [facility] MWF  4h 16min   2/2 bath  Hep 6000 > 3000  F200  RUA AVF Dry wt pending    Impression: 1.  Acute resp failure/ hypoxemia - improving w bipap/ IV NTG. Will plan HD tonight, max UF 3-4 L. Not sure how much is vol overload vs flash pulm edema from uncont HTN.  2.  ESRD usual HD is MWF 3.  HTN 4.  HIV 5.  Hx seizure d/o    Plan - as above  Kelly Splinter MD [employer] pager [phone]   04/12/2017, 11:48 PM

## 2017-04-14 ENCOUNTER — Ambulatory Visit: Payer: Self-pay

## 2017-04-14 ENCOUNTER — Inpatient Hospital Stay (HOSPITAL_COMMUNITY): Payer: Medicare Other

## 2017-04-14 DIAGNOSIS — J9601 Acute respiratory failure with hypoxia: Secondary | ICD-10-CM

## 2017-04-14 DIAGNOSIS — Z992 Dependence on renal dialysis: Secondary | ICD-10-CM

## 2017-04-14 DIAGNOSIS — I509 Heart failure, unspecified: Secondary | ICD-10-CM

## 2017-04-14 DIAGNOSIS — I10 Essential (primary) hypertension: Secondary | ICD-10-CM

## 2017-04-14 DIAGNOSIS — Z7982 Long term (current) use of aspirin: Secondary | ICD-10-CM

## 2017-04-14 DIAGNOSIS — R748 Abnormal levels of other serum enzymes: Secondary | ICD-10-CM

## 2017-04-14 DIAGNOSIS — I5030 Unspecified diastolic (congestive) heart failure: Secondary | ICD-10-CM

## 2017-04-14 LAB — CBC
HEMATOCRIT: 26.6 % — AB (ref 39.0–52.0)
HEMOGLOBIN: 8.3 g/dL — AB (ref 13.0–17.0)
MCH: 28.5 pg (ref 26.0–34.0)
MCHC: 31.2 g/dL (ref 30.0–36.0)
MCV: 91.4 fL (ref 78.0–100.0)
Platelets: 149 10*3/uL — ABNORMAL LOW (ref 150–400)
RBC: 2.91 MIL/uL — AB (ref 4.22–5.81)
RDW: 17.3 % — ABNORMAL HIGH (ref 11.5–15.5)
WBC: 8.1 10*3/uL (ref 4.0–10.5)

## 2017-04-14 LAB — RENAL FUNCTION PANEL
ANION GAP: 10 (ref 5–15)
Albumin: 3.3 g/dL — ABNORMAL LOW (ref 3.5–5.0)
BUN: 36 mg/dL — ABNORMAL HIGH (ref 6–20)
CHLORIDE: 96 mmol/L — AB (ref 101–111)
CO2: 28 mmol/L (ref 22–32)
Calcium: 9.3 mg/dL (ref 8.9–10.3)
Creatinine, Ser: 11.17 mg/dL — ABNORMAL HIGH (ref 0.61–1.24)
GFR calc non Af Amer: 5 mL/min — ABNORMAL LOW (ref >60)
GFR, EST AFRICAN AMERICAN: 5 mL/min — AB (ref >60)
Glucose, Bld: 110 mg/dL — ABNORMAL HIGH (ref 65–99)
POTASSIUM: 3.5 mmol/L (ref 3.5–5.1)
Phosphorus: 4.2 mg/dL (ref 2.5–4.6)
Sodium: 134 mmol/L — ABNORMAL LOW (ref 135–145)

## 2017-04-14 LAB — NM MYOCAR MULTI W/SPECT W/WALL MOTION / EF
CSEPED: 5 min
CSEPHR: 61 %
Estimated workload: 1 METS
MPHR: 169 {beats}/min
Peak HR: 104 {beats}/min
Rest HR: 83 {beats}/min

## 2017-04-14 LAB — TROPONIN I: TROPONIN I: 0.03 ng/mL — AB (ref <0.03)

## 2017-04-14 MED ORDER — HYDRALAZINE HCL 50 MG PO TABS
100.0000 mg | ORAL_TABLET | Freq: Three times a day (TID) | ORAL | Status: DC
  Administered 2017-04-14 – 2017-04-15 (×2): 100 mg via ORAL
  Filled 2017-04-14 (×2): qty 2

## 2017-04-14 MED ORDER — TECHNETIUM TC 99M TETROFOSMIN IV KIT
30.0000 | PACK | Freq: Once | INTRAVENOUS | Status: AC | PRN
  Administered 2017-04-14: 30 via INTRAVENOUS

## 2017-04-14 MED ORDER — ACETAMINOPHEN 325 MG PO TABS
ORAL_TABLET | ORAL | Status: AC
  Filled 2017-04-14: qty 2

## 2017-04-14 MED ORDER — HEPARIN SODIUM (PORCINE) 1000 UNIT/ML DIALYSIS
20.0000 [IU]/kg | INTRAMUSCULAR | Status: DC | PRN
  Filled 2017-04-14: qty 3

## 2017-04-14 MED ORDER — HEPARIN SODIUM (PORCINE) 5000 UNIT/ML IJ SOLN
5000.0000 [IU] | Freq: Three times a day (TID) | INTRAMUSCULAR | Status: DC
  Administered 2017-04-14 – 2017-04-15 (×3): 5000 [IU] via SUBCUTANEOUS
  Filled 2017-04-14 (×3): qty 1

## 2017-04-14 MED ORDER — TECHNETIUM TC 99M TETROFOSMIN IV KIT
10.0000 | PACK | Freq: Once | INTRAVENOUS | Status: AC | PRN
  Administered 2017-04-14: 10 via INTRAVENOUS

## 2017-04-14 MED ORDER — LISINOPRIL 10 MG PO TABS
10.0000 mg | ORAL_TABLET | Freq: Every day | ORAL | Status: DC
  Administered 2017-04-14: 10 mg via ORAL
  Filled 2017-04-14: qty 1

## 2017-04-14 MED ORDER — REGADENOSON 0.4 MG/5ML IV SOLN
INTRAVENOUS | Status: AC
  Administered 2017-04-14: 0.4 mg
  Filled 2017-04-14: qty 5

## 2017-04-14 NOTE — Care Management Note (Signed)
Case Management Note  Patient Details  Name: HENRI BAUMLER MRN: [id number] Date of Birth: Jun 07, 1966  Subjective/Objective:   Patient with  history of well-controlled HIV, ESRD on HD, EF 55%, and HTN who presents with acute onset of shortness of breath  With elevated BP concerning for flash pulmonary edema vs volume overload                Action/Plan: NCM will follow for dc needs.  Expected Discharge Date:                  Expected Discharge Plan:     In-House Referral:     Discharge planning Services  CM Consult  Post Acute Care Choice:    Choice offered to:     DME Arranged:    DME Agency:     HH Arranged:    HH Agency:     Status of Service:  In process, will continue to follow  If discussed at Long Length of Stay Meetings, dates discussed:    Additional Comments:  Zenon Mayo, RN 04/14/2017, 2:28 PM

## 2017-04-14 NOTE — Progress Notes (Signed)
  Date: 04/14/2017  Patient name: JABE JEANBAPTISTE  Medical record number: [id number]  Date of birth: 1966-05-28   I have seen and evaluated this patient and I have discussed the plan of care with the house staff. Please see their note for complete details. I concur with their findings with the following additions/corrections: Mr. Steinfeldt had hypoxia last night. He states he has never had a sleep study but that he does snore. He denies morning headaches or falling asleep easily. His lungs have good airflow without any wheezing or crackles today. He is net -3.9 L since admit. Echo results were reviewed which suggested moderate pulmonary hypertension and diastolic dysfunction. 2. He has a stress test later today. Ischemic workup for his admission acute hypoxic respiratory failure is pending. He has a another dialysis session today. Cardiology and nephrology are changing his antihypertensives. He will need an outpatient sleep study.  Bartholomew Crews, MD 04/14/2017, 12:38 PM

## 2017-04-14 NOTE — Progress Notes (Signed)
Patient began desaturating while sleeping. Patient snoring loudly. Patient placed on Venturi Mask as O2 Sats would not stay above 82 on Nasal Cannula. O2 Saturation now 100%. Will continue to monitor.   Milford Cage, RN

## 2017-04-14 NOTE — Plan of Care (Signed)
Problem: Education: Goal: Knowledge of [place] [facility] Education information/materials will improve Outcome: Progressing Patient updated on plan of care- discussed stress test this morning.

## 2017-04-14 NOTE — Procedures (Signed)
I was present at this dialysis session. I have reviewed the session itself and made appropriate changes.   Filed Weights   04/12/17 2309 04/13/17 0250 04/14/17 0500  Weight: 109.3 kg (241 lb) 109.3 kg (240 lb 15.4 oz) 109.3 kg (240 lb 15.4 oz)     Recent Labs Lab 04/13/17 0926  NA 136  K 3.6  CL 98*  CO2 28  GLUCOSE 71  BUN 17  CREATININE 7.45*  CALCIUM 8.9     Recent Labs Lab 04/10/17 0403 04/12/17 2100 04/13/17 0926  WBC  --  18.2* 10.1  NEUTROABS  --  13.9*  --   HGB 8.5* 9.3* 8.9*  HCT 25.0* 30.8* 28.9*  MCV  --  93.1 92.0  PLT  --  187 162    Scheduled Meds: . amLODipine  10 mg Oral Daily  . aspirin  324 mg Oral Once  . buPROPion  37.5 mg Oral BID  . carvedilol  25 mg Oral BID WC  . dolutegravir  50 mg Oral Daily  . doxazosin  8 mg Oral QHS  . ferric citrate  420 mg Oral BID WC  . heparin subcutaneous  5,000 Units Subcutaneous Q8H  . hydrALAZINE  100 mg Oral TID  . pravastatin  40 mg Oral QPM  . rilpivirine  25 mg Oral Q breakfast  . [START ON 04/19/2017] tenofovir  300 mg Oral Weekly   Continuous Infusions: . sodium chloride    . sodium chloride     PRN Meds:.sodium chloride, sodium chloride, acetaminophen, albuterol, alteplase, heparin, heparin, lidocaine (PF), lidocaine-prilocaine, pentafluoroprop-tetrafluoroeth    Assessment/Plan:  1. Acute respiratory failure/flash pulmonary edema.  Was only 2kg above edw so unsure why he had significant hypoxia and respiratory distress.  Will continue to challenge edw, however he will need an official cardiac workup given recurrent issues with acute respiratory distress/pulmonary edema (3 admissions this month) 1. Appreciate Cards input and will plan for serial dialysis for 3 days to help lower edw and would also recommend starting longer acting BP meds such as lisinopril to help with BP control.   2. ESRD extra treatment today and get back on schedule tomorrow and follow post weights 3. Anemia: of chronic kidney  disease.  Low TSAT, on ESA.  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 Nutrition: renal diet 6. Hypertension: will challenge edw as above and add lisinopril   Donetta Potts,  MD 04/14/2017, 2:30 PM

## 2017-04-14 NOTE — Progress Notes (Signed)
Progress Note  Patient Name: Darrell Johnson Date of Encounter: 04/14/2017  Primary Cardiologist: Dr. Oval Linsey (new)  Subjective   Feeling well.  Denies chest pain or shortness of breath.   Inpatient Medications    Scheduled Meds: . amLODipine  10 mg Oral Daily  . aspirin  324 mg Oral Once  . buPROPion  37.5 mg Oral BID  . carvedilol  25 mg Oral BID WC  . dolutegravir  50 mg Oral Daily  . doxazosin  8 mg Oral QHS  . ferric citrate  420 mg Oral BID WC  . heparin subcutaneous  5,000 Units Subcutaneous Q8H  . hydrALAZINE  100 mg Oral TID  . pravastatin  40 mg Oral QPM  . rilpivirine  25 mg Oral Q breakfast  . [START ON 04/19/2017] tenofovir  300 mg Oral Weekly   Continuous Infusions: . sodium chloride    . sodium chloride     PRN Meds: sodium chloride, sodium chloride, acetaminophen, albuterol, alteplase, heparin, heparin, lidocaine (PF), lidocaine-prilocaine, pentafluoroprop-tetrafluoroeth   Vital Signs    Vitals:   04/14/17 0740 04/14/17 0910 04/14/17 0924 04/14/17 0927  BP: (!) 152/74 (!) 173/86 (!) 184/72 (!) 175/75  Pulse: 97 82 (!) 103 99  Resp: 18 20 18 20   Temp: 98.9 F (37.2 C)     TempSrc: Oral     SpO2: 99%     Weight:      Height:        Intake/Output Summary (Last 24 hours) at 04/14/17 1113 Last data filed at 04/14/17 0529  Gross per 24 hour  Intake              360 ml  Output              400 ml  Net              -40 ml   Filed Weights   04/12/17 2309 04/13/17 0250 04/14/17 0500  Weight: 109.3 kg (241 lb) 109.3 kg (240 lb 15.4 oz) 109.3 kg (240 lb 15.4 oz)    Telemetry    No events  - Personally Reviewed  ECG    N/a  - Personally Reviewed  Physical Exam   GEN: Well-appearing. No acute distress.   Neck: No JVD Cardiac: RRR, II/VI holosystolic murmur at the apex.  No rubs, or gallops.  Respiratory: Clear to auscultation bilaterally.  No crackles, wheezes or rhonchi GI: Soft, nontender, non-distended  MS: No edema; No  deformity. Neuro:  Nonfocal  Psych: Normal affect   Labs    Chemistry Recent Labs Lab 04/10/17 0403 04/12/17 2100 04/13/17 0926  NA 139 137 136  K 4.0 4.7 3.6  CL 102 101 98*  CO2  --  25 28  GLUCOSE 84 132* 71  BUN 32* 35* 17  CREATININE 10.90* 12.14* 7.45*  CALCIUM  --  9.6 8.9  PROT  --  7.4  --   ALBUMIN  --  3.7  --   AST  --  28  --   ALT  --  17  --   ALKPHOS  --  114  --   BILITOT  --  0.8  --   GFRNONAA  --  4* 8*  GFRAA  --  5* 9*  ANIONGAP  --  11 10     Hematology Recent Labs Lab 04/10/17 0403 04/12/17 2100 04/13/17 0926  WBC  --  18.2* 10.1  RBC  --  3.31* 3.14*  HGB  8.5* 9.3* 8.9*  HCT 25.0* 30.8* 28.9*  MCV  --  93.1 92.0  MCH  --  28.1 28.3  MCHC  --  30.2 30.8  RDW  --  17.1* 17.3*  PLT  --  187 162    Cardiac Enzymes Recent Labs Lab 04/13/17 0926 04/14/17 0205  TROPONINI 0.04* 0.03*    Recent Labs Lab 04/10/17 0401  TROPIPOC 0.02     BNP Recent Labs Lab 04/12/17 2100  BNP 830.9*     DDimer No results for input(s): DDIMER in the last 168 hours.   Radiology    Dg Chest Portable 1 View  Result Date: 04/12/2017 CLINICAL DATA:  Dyspnea, dialysis patient for 2 years. EXAM: PORTABLE CHEST 1 VIEW COMPARISON:  None. FINDINGS: Stable cardiomegaly. Chronic blunting of the right costophrenic angle laterally may reflect pleural thickening. No aortic aneurysm. New confluent airspace opacities, predominantly within the mid to lower lung are seen bilaterally with pulmonary vascular redistribution consistent with CHF. Bilateral superimposed pneumonia would be difficult to entirely exclude at the lung bases. No acute nor suspicious osseous abnormality. IMPRESSION: Cardiomegaly with bilateral airspace opacities consistent with CHF. Superimposed pneumonia would be difficult to entirely exclude as with bilateral layering pleural effusions. Electronically Signed   By: Ashley Royalty M.D.   On: 04/12/2017 21:43    Cardiac Studies   Echo  04/13/17: Study Conclusions  - Left ventricle: The cavity size was normal. Wall thickness was   increased in a pattern of mild LVH. Systolic function was normal.   The estimated ejection fraction was in the range of 60% to 65%.   Wall motion was normal; there were no regional wall motion   abnormalities. Features are consistent with a pseudonormal left   ventricular filling pattern, with concomitant abnormal relaxation   and increased filling pressure (grade 2 diastolic dysfunction). - Aortic valve: There was no stenosis. - Mitral valve: Mildly calcified annulus. Mildly calcified leaflets   . There was mild regurgitation. - Left atrium: The atrium was moderately dilated. - Right ventricle: The cavity size was normal. Systolic function   was normal. - Tricuspid valve: Peak RV-RA gradient (S): 44 mm Hg. - Pulmonary arteries: PA peak pressure: 52 mm Hg (S). - Systemic veins: IVC measured 2.2 cm with > 50% respirophasic   variation, suggesting RA pressure 8 mmHg. - Pericardium, extracardiac: A trivial pericardial effusion was   identified.  Impressions:  - Normal LV size with mild LV hypertrophy. EF 60-65%. Moderate   diastolic dysfunction. Normal RV size and systolic function. Mild   mitral regurgitation. Moderate pulmonary hypertension.  Patient Profile     [name] is a 33M with HIV, ESRD on HD, and hypertension here with acute pulmonary edema in the setting of hypertensive emergency.  Assessment & Plan    # Flash pulmonary edema: # Hypertensive emergency:  # Acute on chronic diastolic heart failure: # Moderate pulmonary hypertension: Echo showed grade 2 diastolic heart failure and moderate pulmonary hypertension.  I suspect this is attributable to L sided heart disease and poorly-controlled hypertension.  BP remains poorly-controlled.  We will increase hydralazine to 100 mg tid.  Continue amlodipine, carvedilol, and doxazosin.  If his BP remains above goal, consider adding  an ARB if OK with nephrology.  Stress test has been performed but not uploaded yet.  Goal is to rule out ischemia.  Recent stress echo at [facility] was non-diagnostic 2/2 inadequate heart rate.    # ESRD: Volume management with HD.  Will  go for HD today.   # Mitral regurgitation: Echo 9/24 showed mild MR.  Echo at [facility] showed moderate MR.  This likely fluctuates with BP control and volume status.  Stress test to evaluate for ischemic MR.  BP control as above.   For questions or updates, please contact [place] Please consult [email or web address] for contact info under Cardiology/STEMI.      Signed, Skeet Latch, MD  04/14/2017, 11:13 AM

## 2017-04-14 NOTE — Progress Notes (Signed)
Subjective:  Patient placed on venturi mask due desaturation to 80s overnight while he was sleeping. On further questioning, patients states he has been told by his wife that he snores and stop breathing at night at times. Denied morning headaches and daytime sleepiness. This morning patient was not wearing mask when seen and satting well on room air. Denies chest pain, palpitations, and shortness of breath. Discussed plan with patient about Myoview today. Patient agreed and voiced understanding.   Objective:  Vital signs in last 24 hours: Vitals:   04/14/17 0014 04/14/17 0100 04/14/17 0236 04/14/17 0335  BP: (!) 156/71 (!) 161/73  (!) 142/73  Pulse: 94 91 90 93  Resp:  14 17 17   Temp: 99.6 F (37.6 C)   99.6 F (37.6 C)  TempSrc: Oral   Oral  SpO2: 100% 92% (S) (!) 88% 97%  Weight:      Height:        General: pleasant male, obese, well-developed, lying in bed in no acute distress  Cardiac: regular rate and rhythm, nl S1/S2, no murmurs, rubs or gallops  Pulm: good air movement, diffuse inspiratory and expiratory wheezes, no crackles, no increased work of breathing  Abd: soft, NTND, bowel sounds present  Neuro: A&Ox3, able to move all 4 extremities, no focal deficits noted  Ext: warm and well perfused, no peripheral edema     Assessment/Plan:  Darrell Johnson is a 51 y.o male with history of well-controlled HIV, ESRD on HD, HFpEF EF 55%, and HTN who presents with acute onset of shortness of breath in the setting of significantly elevated BP concerning for flash pulmonary edema vs volume overload.    # Acute hypoxic respiratory failure: secondary to flash pulmonary edema in the setting of elevated BP vs volume overload. Multiple admissions for flash pulmonary edema this year.  He required venturi mask overnight due to hypoxic event while sleeping. On room air when seen and satting well. TTE showed EF 60%, mild LVH, G2DD, mild MR, mild LA dilation, nl RV systolic function, and  moderate pulmonary HTN.  - Cardiology recs: myoview today and increase hydralazine to 50 mg TID   - Wean O2 as able  - Plan for extra HD session today  - Recommending outpatient sleep study to evaluate for sleep apnea   # Troponinemia: Initial I-stat troponin negative and now uptrending. Likely secondary to demand ischemia in the setting of significantly elevated BP vs ESRD.   # ESRD on HD: HD MWF. Nephrology consulted and HD session overnight with uf goal of 3-4 L - Nephrology following and plan for HD today  - Starting IV iron for anemia per nephrology recommendations   # HFpEF: TTE with EF 55%, no regional wall motion abnormalities, mild concentric hypertrophy. Dry weight 236lbs. TTE as above.  - Continue home Coreg 25 mg BID + ASA 8z1 mg QD  - Increase hydralazine as above   # HIV: Diagnosed 1980. Well-controlled. CD4=600 and RNA quant=28. Last saw [name] with ID on 10/01/2016.  -Continue Tivicay 50mg , edurant 25mg , and viread 300mg     # HTN: improved from admission, but remain elevated with sBP 140-170s -Continue home amlodipine 10 mg QD + carvedilol 25mg  BID  - Increase hydralazine as above   F: None  E: will continue to monitor  N: NPO for Myoview-->Renal diet   VTE ppx: SQ heparin   Code status: Full code, not confirmed   Dispo: Anticipated discharge in approximately 2-3 day(s).   Welford Roche,  MD  Internal Medicine PGY-1  P [phone]

## 2017-04-15 ENCOUNTER — Ambulatory Visit: Payer: Self-pay | Admitting: Pharmacist

## 2017-04-15 ENCOUNTER — Other Ambulatory Visit: Payer: Self-pay

## 2017-04-15 LAB — RENAL FUNCTION PANEL
ALBUMIN: 3.5 g/dL (ref 3.5–5.0)
ANION GAP: 11 (ref 5–15)
BUN: 19 mg/dL (ref 6–20)
CALCIUM: 10 mg/dL (ref 8.9–10.3)
CO2: 26 mmol/L (ref 22–32)
Chloride: 99 mmol/L — ABNORMAL LOW (ref 101–111)
Creatinine, Ser: 7.75 mg/dL — ABNORMAL HIGH (ref 0.61–1.24)
GFR, EST AFRICAN AMERICAN: 8 mL/min — AB (ref >60)
GFR, EST NON AFRICAN AMERICAN: 7 mL/min — AB (ref >60)
Glucose, Bld: 107 mg/dL — ABNORMAL HIGH (ref 65–99)
PHOSPHORUS: 4.3 mg/dL (ref 2.5–4.6)
POTASSIUM: 3.9 mmol/L (ref 3.5–5.1)
SODIUM: 136 mmol/L (ref 135–145)

## 2017-04-15 LAB — CBC
HEMATOCRIT: 31.4 % — AB (ref 39.0–52.0)
HEMOGLOBIN: 9.7 g/dL — AB (ref 13.0–17.0)
MCH: 28.4 pg (ref 26.0–34.0)
MCHC: 30.9 g/dL (ref 30.0–36.0)
MCV: 91.8 fL (ref 78.0–100.0)
Platelets: 187 10*3/uL (ref 150–400)
RBC: 3.42 MIL/uL — ABNORMAL LOW (ref 4.22–5.81)
RDW: 18.2 % — ABNORMAL HIGH (ref 11.5–15.5)
WBC: 8.3 10*3/uL (ref 4.0–10.5)

## 2017-04-15 MED ORDER — AMLODIPINE BESYLATE 10 MG PO TABS: 10.0000 mg | ORAL_TABLET | Freq: Every day | ORAL | 0 refills | Status: DC

## 2017-04-15 MED ORDER — HYDRALAZINE HCL 100 MG PO TABS: 100.0000 mg | ORAL_TABLET | Freq: Three times a day (TID) | ORAL | 0 refills | Status: DC

## 2017-04-15 MED ORDER — LISINOPRIL 10 MG PO TABS: 10.0000 mg | ORAL_TABLET | Freq: Every day | ORAL | 0 refills | Status: DC

## 2017-04-15 NOTE — Discharge Summary (Addendum)
Name: Darrell Johnson MRN: [id number] DOB: September 22, 1965 51 y.o. PCP: Jule Ser, DO  Date of Admission: 04/12/2017  8:53 PM Date of Discharge: 04/15/2017 Attending Physician: Bartholomew Crews, MD  Discharge Diagnosis: 1. Acute hypoxic respiratory failure secondary to cardiogenic flash pulmonary edema   Active Problems:   Flash pulmonary edema (HCC)   ESRD on dialysis (HCC)   Acute respiratory failure with hypoxia (HCC)   Acute exacerbation of CHF (congestive heart failure) (HCC)   Moderate mitral regurgitation   Discharge Medications: Allergies as of 04/15/2017   No Known Allergies     Medication List    TAKE these medications   acetaminophen 500 MG tablet Commonly known as:  TYLENOL Take 1,000 mg by mouth every 6 (six) hours as needed for mild pain.   albuterol (2.5 MG/3ML) 0.083% nebulizer solution Commonly known as:  PROVENTIL Take 3 mLs (2.5 mg total) by nebulization every 6 (six) hours as needed for wheezing or shortness of breath.   albuterol 108 (90 Base) MCG/ACT inhaler Commonly known as:  PROVENTIL HFA;VENTOLIN HFA Inhale 2 puffs into the lungs every 6 (six) hours as needed for wheezing or shortness of breath.   amLODipine 10 MG tablet Commonly known as:  NORVASC Take 1 tablet (10 mg total) by mouth daily. What changed:  medication strength   buPROPion 75 MG tablet Commonly known as:  WELLBUTRIN TAKE 1/2 TABLET TWICE A DAY   carvedilol 25 MG tablet Commonly known as:  COREG Take 1 tablet (25 mg total) by mouth 2 (two) times daily with a meal.   doxazosin 8 MG tablet Commonly known as:  CARDURA Take 1 tablet (8 mg total) by mouth at bedtime.   EDURANT 25 MG Tabs tablet Generic drug:  rilpivirine TAKE 1 TABLET (25 MG TOTAL) BY MOUTH DAILY WITH BREAKFAST.   ferric citrate 1 GM 210 MG(Fe) tablet Commonly known as:  AURYXIA Take 420 mg by mouth 2 (two) times daily with a meal.   furosemide 40 MG tablet Commonly known as:  LASIX Take 1  tablet (40 mg total) by mouth as needed for fluid or edema.   hydrALAZINE 100 MG tablet Commonly known as:  APRESOLINE Take 1 tablet (100 mg total) by mouth 3 (three) times daily. What changed:  medication strength  how much to take   lisinopril 10 MG tablet Commonly known as:  PRINIVIL,ZESTRIL Take 1 tablet (10 mg total) by mouth at bedtime.   montelukast 10 MG tablet Commonly known as:  SINGULAIR Take 1 tablet (10 mg total) by mouth at bedtime.   pravastatin 40 MG tablet Commonly known as:  PRAVACHOL TAKE 1 TABLET (40 MG TOTAL) BY MOUTH EVERY EVENING.   TIVICAY 50 MG tablet Generic drug:  dolutegravir TAKE 1 TABLET (50 MG TOTAL) BY MOUTH DAILY.   VIREAD 300 MG tablet Generic drug:  tenofovir TAKE 1 TABLET BY MOUTH ONCE A WEEK What changed:  See the new instructions.            Discharge Care Instructions        Start     Ordered   04/15/17 0000  amLODipine (NORVASC) 10 MG tablet  Daily     04/15/17 1356   04/15/17 0000  hydrALAZINE (APRESOLINE) 100 MG tablet  3 times daily     04/15/17 1356   04/15/17 0000  lisinopril (PRINIVIL,ZESTRIL) 10 MG tablet  Daily at bedtime     04/15/17 1356   04/15/17 0000  Increase activity slowly  04/15/17 1356   04/15/17 0000  Diet - low sodium heart healthy     04/15/17 1356   04/15/17 0000  Call MD for:  difficulty breathing, headache or visual disturbances     04/15/17 1356   04/15/17 0000  Call MD for:  extreme fatigue     04/15/17 1356   04/15/17 0000  (HEART FAILURE PATIENTS) Call MD:  Anytime you have any of the following symptoms: 1) 3 pound weight gain in 24 hours or 5 pounds in 1 week 2) shortness of breath, with or without a dry hacking cough 3) swelling in the hands, feet or stomach 4) if you have to sleep on extra pillows at night in order to breathe.     04/15/17 1356      Disposition and follow-up:   [name] was discharged from [facility] in Stable condition.  At the hospital  follow up visit please address:  1.  Please address patient volume and respiratory status. Please assess blood pressure as patient's home BP regimen was changed during admission.   2. Please consider evaluation for OSA. Patient reported noted and apnea episodes at night. He also required supplemental oxygen at night while admitted due to desatting to low 80s while sleeping.   3.  Labs / imaging needed at time of follow-up: None   4.  Pending labs/ test needing follow-up: None   Follow-up Appointments: Follow-up Information    [place]. Go to.   Why:  You have a hospital follow-up appointment scheduled for 10/2 at 2:15 PM Contact information: [address] [place] [facility] Course by problem list:   Darrell Johnson. Darrell Johnson is a 51 y.o male with history of well-controlled HIV, ESRD on HD, HFpEF EF 55%, and HTN who presents with acute onset of shortness of breath in the setting of significantly elevated BP concerning for flash pulmonary edema vs volume overload.    1. Acute hypoxic respiratory failure: Patient presented with acute onset shortness of breath, hypoxic to 87%, and hypertensive 212/104. He has been admitted multiple times this year with a similar presentation. It is unclear if his uncontrolled hypertension is causing cardiogenic flash pulmonary edema vs poor volume status control (patient has been noted to drink high amounts of water). He was initially placed on BiPAP and nitro drip with improvement in respiratory status and blood pressure the next day. His dialysis dry weight was changed to 107 kg from 111kg. Cardiology was consulted and performed and ischemic evaluation. TTE with no changes from prior on 9/18 and Myoview low risk per cardiology (inferior wall scarring though to be artifact). His home hydralazine was increased to 100 mg TID and he started on lisinopril 10 mg QD by nephrology. His blood pressure  on day of discharge was 140/68. Patient was advised to inform his outpatient dialysis center of his new EDW. He was also recommended to undergo sleep study as an outpatient as he was continuously desatted overnight and required venturi mask while sleeping.   Patient was continued on all home medications for his chronic medical conditions.    Discharge Vitals:   BP 127/62 (BP Location: Left Arm)   Pulse 93   Temp 98.7 F (37.1 C) (Oral)   Resp 18   Ht 6\' 2"  (1.88 m)   Wt 231 lb 11.3 oz (105.1 kg)   SpO2 95%   BMI 29.75  kg/m   Pertinent Labs, Studies, and Procedures:   TTE 9/24: Impressions: - Normal LV size with mild LV hypertrophy. EF 60-65%. Moderate   diastolic dysfunction. Normal RV size and systolic function. Mild   mitral regurgitation. Moderate pulmonary hypertension.  Myoview 9/25: IMPRESSION: 1. Evidence of inferior wall scar. No evidence of inducible myocardial ischemia. 2. Severe left ventricular cavity dilatation. 3. Left ventricular ejection fraction 52% 4. Non invasive risk stratification*: Intermediate     Discharge Instructions: Discharge Instructions    (HEART FAILURE PATIENTS) Call MD:  Anytime you have any of the following symptoms: 1) 3 pound weight gain in 24 hours or 5 pounds in 1 week 2) shortness of breath, with or without a dry hacking cough 3) swelling in the hands, feet or stomach 4) if you have to sleep on extra pillows at night in order to breathe.    Complete by:  As directed    Call MD for:  difficulty breathing, headache or visual disturbances    Complete by:  As directed    Call MD for:  extreme fatigue    Complete by:  As directed    Diet - low sodium heart healthy    Complete by:  As directed    Increase activity slowly    Complete by:  As directed       Signed: Welford Roche, MD  Internal Medicine PGY-1  P [phone]

## 2017-04-15 NOTE — Progress Notes (Signed)
Progress Note  Patient Name: Darrell Johnson Date of Encounter: 04/15/2017  Primary Cardiologist: Dr. Oval Linsey (new)  Subjective   No chest pain or shortness of breath.  Currently in HD.  Inpatient Medications    Scheduled Meds: . amLODipine  10 mg Oral Daily  . aspirin  324 mg Oral Once  . buPROPion  37.5 mg Oral BID  . carvedilol  25 mg Oral BID WC  . dolutegravir  50 mg Oral Daily  . doxazosin  8 mg Oral QHS  . ferric citrate  420 mg Oral BID WC  . heparin subcutaneous  5,000 Units Subcutaneous Q8H  . hydrALAZINE  100 mg Oral TID  . lisinopril  10 mg Oral QHS  . pravastatin  40 mg Oral QPM  . rilpivirine  25 mg Oral Q breakfast  . [START ON 04/19/2017] tenofovir  300 mg Oral Weekly   Continuous Infusions: . sodium chloride    . sodium chloride     PRN Meds: sodium chloride, sodium chloride, acetaminophen, albuterol, alteplase, heparin, heparin, heparin, heparin, lidocaine (PF), lidocaine-prilocaine, pentafluoroprop-tetrafluoroeth   Vital Signs    Vitals:   04/15/17 0950 04/15/17 0955 04/15/17 1030 04/15/17 1100  BP: (!) 151/75 (!) 146/74 130/75 134/68  Pulse: 86 85 94 94  Resp: 16 16 16 15   Temp:      TempSrc:      SpO2:      Weight:      Height:        Intake/Output Summary (Last 24 hours) at 04/15/17 1115 Last data filed at 04/15/17 0700  Gross per 24 hour  Intake              410 ml  Output             4000 ml  Net            -3590 ml   Filed Weights   04/14/17 1830 04/15/17 0500 04/15/17 0940  Weight: 103.3 kg (227 lb 11.8 oz) 107 kg (235 lb 14.3 oz) 105.1 kg (231 lb 11.3 oz)    Telemetry    No events  - Personally Reviewed  ECG    N/a  - Personally Reviewed  Physical Exam   VS:  BP 134/68   Pulse 94   Temp 98.4 F (36.9 C) (Oral)   Resp 15   Ht 6\' 2"  (1.88 m)   Wt 105.1 kg (231 lb 11.3 oz)   SpO2 99%   BMI 29.75 kg/m  , BMI Body mass index is 29.75 kg/m. GENERAL:  Well appearing HEENT: Pupils equal round and reactive, fundi  not visualized, oral mucosa unremarkable NECK:  No jugular venous distention, waveform within normal limits, carotid upstroke brisk and symmetric, no bruits, no thyromegaly LUNGS:  Clear to auscultation bilaterally HEART:  RRR.  PMI not displaced or sustained,S1 and S2 within normal limits, no S3, no S4, no clicks, no rubs, III/VI holostystolic murmur at the apex ABD:  Flat, positive bowel sounds normal in frequency in pitch, no bruits, no rebound, no guarding, no midline pulsatile mass, no hepatomegaly, no splenomegaly EXT:  2 plus pulses throughout, no edema, no cyanosis no clubbing SKIN:  No rashes no nodules NEURO:  Cranial nerves II through XII grossly intact, motor grossly intact throughout [facility]:  Cognitively intact, oriented to person place and time  Labs    Chemistry  Recent Labs Lab 04/12/17 2100 04/13/17 0926 04/14/17 1454 04/15/17 1040  NA 137 136 134* 136  K 4.7 3.6 3.5 3.9  CL 101 98* 96* 99*  CO2 25 28 28 26   GLUCOSE 132* 71 110* 107*  BUN 35* 17 36* 19  CREATININE 12.14* 7.45* 11.17* 7.75*  CALCIUM 9.6 8.9 9.3 10.0  PROT 7.4  --   --   --   ALBUMIN 3.7  --  3.3* 3.5  AST 28  --   --   --   ALT 17  --   --   --   ALKPHOS 114  --   --   --   BILITOT 0.8  --   --   --   GFRNONAA 4* 8* 5* 7*  GFRAA 5* 9* 5* 8*  ANIONGAP 11 10 10 11      Hematology  Recent Labs Lab 04/12/17 2100 04/13/17 0926 04/14/17 1454  WBC 18.2* 10.1 8.1  RBC 3.31* 3.14* 2.91*  HGB 9.3* 8.9* 8.3*  HCT 30.8* 28.9* 26.6*  MCV 93.1 92.0 91.4  MCH 28.1 28.3 28.5  MCHC 30.2 30.8 31.2  RDW 17.1* 17.3* 17.3*  PLT 187 162 149*    Cardiac Enzymes  Recent Labs Lab 04/13/17 0926 04/14/17 0205  TROPONINI 0.04* 0.03*     Recent Labs Lab 04/10/17 0401  TROPIPOC 0.02     BNP  Recent Labs Lab 04/12/17 2100  BNP 830.9*     DDimer No results for input(s): DDIMER in the last 168 hours.   Radiology    Nm Myocar Multi W/spect W/wall Motion / Ef  Result Date:  04/14/2017 CLINICAL DATA:  CHF, hypertension, hyperlipidemia, respiratory failure and end-stage renal disease. EXAM: MYOCARDIAL IMAGING WITH SPECT (REST AND PHARMACOLOGIC-STRESS) GATED LEFT VENTRICULAR WALL MOTION STUDY LEFT VENTRICULAR EJECTION FRACTION TECHNIQUE: Standard myocardial SPECT imaging was performed after resting intravenous injection of 10 mCi Tc-50m tetrofosmin. Subsequently, intravenous infusion of Lexiscan was performed under the supervision of the Cardiology staff. At peak effect of the drug, 30 mCi Tc-51m tetrofosmin was injected intravenously and standard myocardial SPECT imaging was performed. Quantitative gated imaging was also performed to evaluate left ventricular wall motion, and estimate left ventricular ejection fraction. COMPARISON:  None. FINDINGS: Perfusion: Diminished perfusion of the inferior wall present consistent with scar. No evidence of inducible myocardial ischemia. Wall Motion: The left ventricular cavity is superiorly dilated. No significant hypokinetic segments identified. Left Ventricular Ejection Fraction: 52 % End diastolic volume 222 ml End systolic volume 979 ml IMPRESSION: 1. Evidence of inferior wall scar. No evidence of inducible myocardial ischemia. 2. Severe left ventricular cavity dilatation. 3. Left ventricular ejection fraction 52% 4. Non invasive risk stratification*: Intermediate *2012 Appropriate Use Criteria for Coronary Revascularization Focused Update: J Am Coll Cardiol. 8921;19(4):174-081. http://content.airportbarriers.com.aspx?articleid=[email or web address] Electronically Signed   By: Aletta Edouard M.D.   On: 04/14/2017 16:06    Cardiac Studies   Echo 04/13/17: Study Conclusions  - Left ventricle: The cavity size was normal. Wall thickness was   increased in a pattern of mild LVH. Systolic function was normal.   The estimated ejection fraction was in the range of 60% to 65%.   Wall motion was normal; there were no regional wall motion    abnormalities. Features are consistent with a pseudonormal left   ventricular filling pattern, with concomitant abnormal relaxation   and increased filling pressure (grade 2 diastolic dysfunction). - Aortic valve: There was no stenosis. - Mitral valve: Mildly calcified annulus. Mildly calcified leaflets   . There was mild regurgitation. - Left atrium: The atrium was moderately dilated. - Right ventricle: The  cavity size was normal. Systolic function   was normal. - Tricuspid valve: Peak RV-RA gradient (S): 44 mm Hg. - Pulmonary arteries: PA peak pressure: 52 mm Hg (S). - Systemic veins: IVC measured 2.2 cm with > 50% respirophasic   variation, suggesting RA pressure 8 mmHg. - Pericardium, extracardiac: A trivial pericardial effusion was   identified.  Impressions:  - Normal LV size with mild LV hypertrophy. EF 60-65%. Moderate   diastolic dysfunction. Normal RV size and systolic function. Mild   mitral regurgitation. Moderate pulmonary hypertension.  Patient Profile     Mr. Guertin is a 37M with HIV, ESRD on HD, and hypertension here with acute pulmonary edema in the setting of hypertensive emergency.  Assessment & Plan    # Flash pulmonary edema: # Hypertensive emergency:  # Acute on chronic diastolic heart failure: # Moderate pulmonary hypertension: Echo showed grade 2 diastolic heart failure and moderate pulmonary hypertension.  Blood pressure much better. Continue amlodipine, carvedilol, doxazosin and hydralazine.  Lisinopril was started 9/25.  Lexiscan Myoview was negative for ischemia.  There was diaphragmatic attenuation vs. Scar, but no associated wall motion abnormality on either Lexiscan Myoview or echo.  Suspect that this is artifact.  No further cardiac work up.    # ESRD: Volume management with HD.  He is getting HD 3 days in a row.  BP has tolerated this challenge thus far.   # Mitral regurgitation: Echo 9/24 showed mild MR.  Echo at [facility] showed moderate MR.  This  likely fluctuates with BP control and volume status.  Stress test to evaluate for ischemic MR.  BP control as above.   For questions or updates, please contact [place] Please consult [email or web address] for contact info under Cardiology/STEMI.      Signed, Skeet Latch, MD  04/15/2017, 11:15 AM

## 2017-04-15 NOTE — Progress Notes (Signed)
  Date: 04/15/2017  Patient name: Darrell Johnson  Medical record number: [id number]  Date of birth: 1966-02-20   I have seen and evaluated this patient and I have discussed the plan of care with the house staff. Please see their note for complete details. I concur with their findings.  Bartholomew Crews, MD 04/15/2017, 3:39 PM

## 2017-04-15 NOTE — Discharge Instructions (Addendum)
You were admitted to [facility] for sudden onset of shortness of breath.   It is unclear what is causing your sudden onset of shortness of breath. It could be due to high blood pressure or due to accumulation of fluid in your body.   You were seen by cardiology who performed a stress test. They did not see any concerning findings in this study. The cardiologists also increased the dose of your hydralazine for better control of your blood pressure. Please start taking 100 mg three times a day.   You were also started on lisinopril. Please start taking 10 mg (1 tablet) daily.  Your nephrologists in the hospital decreased your dry weight to 107 kilos. Please make sure they keep you on this dry weight in your outpatient dialysis center.  You have a hospital follow up appointment scheduled for 10/2 at 2:15pm.  Please talk to your doctor about doing a sleep study to evaluate you for sleep apnea. This could be contributing to your shortness of breath.

## 2017-04-15 NOTE — Procedures (Signed)
I was present at this dialysis session. I have reviewed the session itself and made appropriate changes.   Filed Weights   04/14/17 1415 04/14/17 1830 04/15/17 0500  Weight: 107.4 kg (236 lb 12.4 oz) 103.3 kg (227 lb 11.8 oz) 107 kg (235 lb 14.3 oz)     Recent Labs Lab 04/14/17 1454  NA 134*  K 3.5  CL 96*  CO2 28  GLUCOSE 110*  BUN 36*  CREATININE 11.17*  CALCIUM 9.3  PHOS 4.2     Recent Labs Lab 04/12/17 2100 04/13/17 0926 04/14/17 1454  WBC 18.2* 10.1 8.1  NEUTROABS 13.9*  --   --   HGB 9.3* 8.9* 8.3*  HCT 30.8* 28.9* 26.6*  MCV 93.1 92.0 91.4  PLT 187 162 149*    Scheduled Meds: . amLODipine  10 mg Oral Daily  . aspirin  324 mg Oral Once  . buPROPion  37.5 mg Oral BID  . carvedilol  25 mg Oral BID WC  . dolutegravir  50 mg Oral Daily  . doxazosin  8 mg Oral QHS  . ferric citrate  420 mg Oral BID WC  . heparin subcutaneous  5,000 Units Subcutaneous Q8H  . hydrALAZINE  100 mg Oral TID  . lisinopril  10 mg Oral QHS  . pravastatin  40 mg Oral QPM  . rilpivirine  25 mg Oral Q breakfast  . [START ON 04/19/2017] tenofovir  300 mg Oral Weekly   Continuous Infusions: . sodium chloride    . sodium chloride     PRN Meds:.sodium chloride, sodium chloride, acetaminophen, albuterol, alteplase, heparin, heparin, heparin, heparin, lidocaine (PF), lidocaine-prilocaine, pentafluoroprop-tetrafluoroeth    [facility]  4h 31min 2/2 bath Hep 6000 >3000 F200 RUA AVF EDW 107 kg  Assessment/Plan:  1. Acute respiratory failure/flash pulmonary edema. Was only 2kg above edw so unsure why he had significant hypoxia and respiratory distress. Will continue to challenge edw, however he will need an official cardiac workup given recurrent issues with acute respiratory distress/pulmonary edema (3 admissions this month) 1. Appreciate Cards input  2. For myoview today 3. Markedly improved after serial dialysis for 3 days 4. Started lisinopril to help with BP control.    2. ESRD s/p extra treatment yesterday and get back on schedule today and follow post weights 3. Anemia: of chronic kidney disease. Low TSAT, on ESA. 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 Nutrition: renal diet 6. Hypertension: will challenge edw as above and add lisinopril Donetta Potts,  MD 04/15/2017, 10:57 AM

## 2017-04-15 NOTE — Patient Outreach (Signed)
[facility]) Care Management  04/15/17  Darrell Johnson March 02, 1966 [id number]  Patient was admitted to hospital  [facility] on 04/12/2017 for flash pulmonary edema. Patient is being discharged to home today. Successful outreach completed with patient to remind him of appointment tomorrow and patient was agreeable with RNCM visit. Patient identification verified. Encouraged to call if he has any questions or concerns once he gets home this afternoon.  [place] R. Chet Greenley, RN, BSN, [place] Management Coordinator [phone]

## 2017-04-15 NOTE — Progress Notes (Signed)
   Subjective:  No acute events overnight. Patient satting well on room air when seen this morning. Denies chest pain, palpitations, and shortness of breath. No acute complaints this morning.  Objective:  Vital signs in last 24 hours: Vitals:   04/14/17 2000 04/14/17 2012 04/14/17 2316 04/15/17 0316  BP: (!) 159/89 (!) 159/89 131/65 132/82  Pulse: 75  84 96  Resp: 17  20   Temp: (!) 97.2 F (36.2 C)  98.8 F (37.1 C) 99.1 F (37.3 C)  TempSrc: Axillary  Oral Oral  SpO2: 97%  100% 100%  Weight:      Height:        General: pleasant male, obese, well-developed, lying in bed in no acute distress Cardiac: regular rate and rhythm, nl E4/M3, II/VI systolic ejection murmur best heard over left upper sternal border  Pulm: CTAB, no wheezes or crackles, no increased work of breathing  Abd: soft, NTND, bowel sounds present  Neuro: A&Ox3, able to move all 4 extremities, no focal deficits noted Ext: warm and well perfused, no peripheral edema   Assessment/Plan:  Duncan Dull. Ebeling is a 51 y.o male with history of well-controlled HIV, ESRD on HD, HFpEF EF 55%, and HTN who presents with acute onset of shortness of breath in the setting of significantly elevated BP concerning for flash pulmonary edema vs volume overload.    # Hypertensive emergency # Acute hypoxic respiratory failure: secondary to flash pulmonary edema in the setting of elevated BP vs volume overload. Multiple admissions for flash pulmonary edema this year. He continues to desat overnight and requires Venturi mask. Per cardiology, Myoview low risk and no further workup needed. They continue to recommend aggressive volume management with dialysis. BP remains elevated during admission but improved.  On day of discharge his BP was 140/68. -  Per cards rec, hydralazine 100 TID  - Per nephrology, lisinopril 10 mg QD. Patient's new dry weight will be 107 kg. Advised patient to discuss this with outpatient dialysis center. - Has  schedule hospital follow-up appointment on 10/2 at 2:15 PM ay [facility]  - Recommending outpatient sleep study to evaluate for sleep apnea  # ESRD on HD: HD MWF - Nephrology following  # HFpEF: TTE with EF 55%, no regional wall motion abnormalities, mild concentric hypertrophy. Dry weight 236lbs. TTE as above.  - Continue home Coreg 25 mg BID + ASA 8z1 mg QD + hydral 100 TID + lisinopril 10 QD    # HIV: Diagnosed 1980. Well-controlled. CD4=600 and RNA quant=28. Last saw [name] with ID on 10/01/2016.  -Continue Tivicay 50mg , edurant 25mg , and viread 300mg     # HTN: improved from admission, but remain elevated with sBP 140-170s - Continue antihypertensives as above   F: None  E: will continue to monitor  N: Renal diet   VTE ppx: SQ heparin   Code status: Full code, not confirmed   Dispo: Anticipated discharge in approximately today.   Welford Roche, MD  Internal Medicine PGY-1  P [phone]

## 2017-04-16 ENCOUNTER — Other Ambulatory Visit: Payer: Self-pay | Admitting: *Deleted

## 2017-04-16 ENCOUNTER — Other Ambulatory Visit: Payer: Self-pay

## 2017-04-16 NOTE — Telephone Encounter (Signed)
Request sent to pcp for review.Despina Hidden Cassady9/27/201812:15 PM

## 2017-04-17 DIAGNOSIS — Z23 Encounter for immunization: Secondary | ICD-10-CM | POA: Diagnosis not present

## 2017-04-17 DIAGNOSIS — D631 Anemia in chronic kidney disease: Secondary | ICD-10-CM | POA: Diagnosis not present

## 2017-04-17 DIAGNOSIS — J810 Acute pulmonary edema: Secondary | ICD-10-CM | POA: Diagnosis not present

## 2017-04-17 DIAGNOSIS — N2581 Secondary hyperparathyroidism of renal origin: Secondary | ICD-10-CM | POA: Diagnosis not present

## 2017-04-17 DIAGNOSIS — N186 End stage renal disease: Secondary | ICD-10-CM | POA: Diagnosis not present

## 2017-04-18 DIAGNOSIS — Z23 Encounter for immunization: Secondary | ICD-10-CM | POA: Diagnosis not present

## 2017-04-18 DIAGNOSIS — N2581 Secondary hyperparathyroidism of renal origin: Secondary | ICD-10-CM | POA: Diagnosis not present

## 2017-04-18 DIAGNOSIS — J810 Acute pulmonary edema: Secondary | ICD-10-CM | POA: Diagnosis not present

## 2017-04-18 DIAGNOSIS — D631 Anemia in chronic kidney disease: Secondary | ICD-10-CM | POA: Diagnosis not present

## 2017-04-18 DIAGNOSIS — N186 End stage renal disease: Secondary | ICD-10-CM | POA: Diagnosis not present

## 2017-04-19 DIAGNOSIS — I120 Hypertensive chronic kidney disease with stage 5 chronic kidney disease or end stage renal disease: Secondary | ICD-10-CM | POA: Diagnosis not present

## 2017-04-19 DIAGNOSIS — N186 End stage renal disease: Secondary | ICD-10-CM | POA: Diagnosis not present

## 2017-04-19 DIAGNOSIS — Z992 Dependence on renal dialysis: Secondary | ICD-10-CM | POA: Diagnosis not present

## 2017-04-20 DIAGNOSIS — N2581 Secondary hyperparathyroidism of renal origin: Secondary | ICD-10-CM | POA: Diagnosis not present

## 2017-04-20 DIAGNOSIS — N186 End stage renal disease: Secondary | ICD-10-CM | POA: Diagnosis not present

## 2017-04-20 DIAGNOSIS — D631 Anemia in chronic kidney disease: Secondary | ICD-10-CM | POA: Diagnosis not present

## 2017-04-21 ENCOUNTER — Ambulatory Visit: Payer: PRIVATE HEALTH INSURANCE

## 2017-04-22 ENCOUNTER — Other Ambulatory Visit: Payer: Self-pay | Admitting: Pharmacist

## 2017-04-22 DIAGNOSIS — D631 Anemia in chronic kidney disease: Secondary | ICD-10-CM | POA: Diagnosis not present

## 2017-04-22 DIAGNOSIS — N186 End stage renal disease: Secondary | ICD-10-CM | POA: Diagnosis not present

## 2017-04-22 DIAGNOSIS — N2581 Secondary hyperparathyroidism of renal origin: Secondary | ICD-10-CM | POA: Diagnosis not present

## 2017-04-22 NOTE — Patient Outreach (Signed)
[facility]) Care Management  04/22/2017  ZENON LEAF Feb 13, 1966 [id number]   Patient was called to follow up on medication management.  Unfortunately, when the patient's number was called, a recording came on that stated the number dialed "un available" and did not have an option to leave a message.   Plan:  Call patient back in 5-7 business days.   Elayne Guerin, PharmD, [place] Clinical Pharmacist [phone]

## 2017-04-24 DIAGNOSIS — N186 End stage renal disease: Secondary | ICD-10-CM | POA: Diagnosis not present

## 2017-04-24 DIAGNOSIS — D631 Anemia in chronic kidney disease: Secondary | ICD-10-CM | POA: Diagnosis not present

## 2017-04-24 DIAGNOSIS — N2581 Secondary hyperparathyroidism of renal origin: Secondary | ICD-10-CM | POA: Diagnosis not present

## 2017-04-25 ENCOUNTER — Other Ambulatory Visit: Payer: Self-pay

## 2017-04-25 NOTE — Patient Outreach (Signed)
[facility]) Care Management  04/25/17  Darrell Johnson [birth year] [id number]  Attempted to reach patient without success. RNCM unable to leave a voicemail as message stated that "The wireless customer you are trying to reach is not available. Please try your call again later."   RNCM will attempt outreach again next week.  [place] R. Markela Wee, RN, BSN, [place] Management Coordinator [phone]

## 2017-04-27 ENCOUNTER — Other Ambulatory Visit: Payer: Self-pay | Admitting: Pharmacist

## 2017-04-27 DIAGNOSIS — N2581 Secondary hyperparathyroidism of renal origin: Secondary | ICD-10-CM | POA: Diagnosis not present

## 2017-04-27 DIAGNOSIS — D631 Anemia in chronic kidney disease: Secondary | ICD-10-CM | POA: Diagnosis not present

## 2017-04-27 DIAGNOSIS — N186 End stage renal disease: Secondary | ICD-10-CM | POA: Diagnosis not present

## 2017-04-27 NOTE — Patient Outreach (Signed)
[facility] [facility]) Care Management  04/27/2017  Darrell Johnson [birth year] [id number]  Patient was called to follow up post-discharge. The mobile number listed for the pateint is not working. The alternate number in his contact information was called. Patient's wife answered . HIPAA compliant message left with the patient's wife [name]).  Plan: Recall patient in 5 business days if I do not hear back from him first.  Elayne Guerin, PharmD, [place] Clinical Pharmacist [phone]

## 2017-04-29 DIAGNOSIS — D631 Anemia in chronic kidney disease: Secondary | ICD-10-CM | POA: Diagnosis not present

## 2017-04-29 DIAGNOSIS — N2581 Secondary hyperparathyroidism of renal origin: Secondary | ICD-10-CM | POA: Diagnosis not present

## 2017-04-29 DIAGNOSIS — N186 End stage renal disease: Secondary | ICD-10-CM | POA: Diagnosis not present

## 2017-04-29 NOTE — Telephone Encounter (Signed)
A user error has taken place: encounter opened in error, closed for administrative reasons.

## 2017-04-30 ENCOUNTER — Encounter: Payer: Self-pay | Admitting: Internal Medicine

## 2017-05-01 ENCOUNTER — Other Ambulatory Visit: Payer: Self-pay

## 2017-05-01 DIAGNOSIS — N186 End stage renal disease: Secondary | ICD-10-CM | POA: Diagnosis not present

## 2017-05-01 DIAGNOSIS — N2581 Secondary hyperparathyroidism of renal origin: Secondary | ICD-10-CM | POA: Diagnosis not present

## 2017-05-01 DIAGNOSIS — D631 Anemia in chronic kidney disease: Secondary | ICD-10-CM | POA: Diagnosis not present

## 2017-05-01 NOTE — Patient Outreach (Signed)
[place] [facility]) Care Management  05/01/17  [name] 22-Sep-1965 [id number]  Attempted to reach patient without success. RNCM unable to leave a voicemail as message stated that "The wireless customer you are trying to reach is not available. Please try your call again later."   Patient immediately returned call to [name]. Patient identification verified/  Patient stated that he has been doing well and has no complaints. He stated that he is "kind of getting back to my old self." He currently denies any shortness of breath any issues with edema. He stated that he is finally starting to feel a little better. He is currently not home, so call kept short. Patient denies any needs or concerns at present.   Patient stated that he finally had his new carpet installed. RNCM advised patient that [name] is still having difficulty getting through to him. RNCM encouraged him to call her as she has not been able to leave him a message. Patient was agreeable and RNCM provided him with contact information for [name], [name]. He stated he would try to give her a call.  Plan: RNCM will follow up with patient next week and patient is agreeable.   [place] R. Becky Colan, RN, BSN, [place] Management Coordinator [phone]

## 2017-05-03 ENCOUNTER — Other Ambulatory Visit: Payer: Self-pay | Admitting: Internal Medicine

## 2017-05-03 DIAGNOSIS — I10 Essential (primary) hypertension: Secondary | ICD-10-CM

## 2017-05-04 ENCOUNTER — Other Ambulatory Visit: Payer: Self-pay | Admitting: Pharmacist

## 2017-05-04 DIAGNOSIS — N2581 Secondary hyperparathyroidism of renal origin: Secondary | ICD-10-CM | POA: Diagnosis not present

## 2017-05-04 DIAGNOSIS — D631 Anemia in chronic kidney disease: Secondary | ICD-10-CM | POA: Diagnosis not present

## 2017-05-04 DIAGNOSIS — N186 End stage renal disease: Secondary | ICD-10-CM | POA: Diagnosis not present

## 2017-05-04 NOTE — Patient Outreach (Signed)
[facility]) Care Management  05/04/2017  Darrell Johnson 25-Apr-1966 [id number]   Called patient to follow up on medication management. Unfortunately, the patient's phone is still non-operational.  Genice Rouge, [facility] Nurse and C3 contact was called. Diane said the patient was supposed to get back with her today with new contact information and once he does, she will send his contact information.   Elayne Guerin, PharmD, [place] Clinical Pharmacist [phone] .

## 2017-05-06 DIAGNOSIS — D631 Anemia in chronic kidney disease: Secondary | ICD-10-CM | POA: Diagnosis not present

## 2017-05-06 DIAGNOSIS — N2581 Secondary hyperparathyroidism of renal origin: Secondary | ICD-10-CM | POA: Diagnosis not present

## 2017-05-06 DIAGNOSIS — N186 End stage renal disease: Secondary | ICD-10-CM | POA: Diagnosis not present

## 2017-05-07 DIAGNOSIS — Z992 Dependence on renal dialysis: Secondary | ICD-10-CM | POA: Diagnosis not present

## 2017-05-07 DIAGNOSIS — N186 End stage renal disease: Secondary | ICD-10-CM | POA: Diagnosis not present

## 2017-05-07 DIAGNOSIS — I871 Compression of vein: Secondary | ICD-10-CM | POA: Diagnosis not present

## 2017-05-07 DIAGNOSIS — T82858A Stenosis of vascular prosthetic devices, implants and grafts, initial encounter: Secondary | ICD-10-CM | POA: Diagnosis not present

## 2017-05-08 ENCOUNTER — Other Ambulatory Visit: Payer: Self-pay

## 2017-05-08 DIAGNOSIS — N2581 Secondary hyperparathyroidism of renal origin: Secondary | ICD-10-CM | POA: Diagnosis not present

## 2017-05-08 DIAGNOSIS — D631 Anemia in chronic kidney disease: Secondary | ICD-10-CM | POA: Diagnosis not present

## 2017-05-08 DIAGNOSIS — N186 End stage renal disease: Secondary | ICD-10-CM | POA: Diagnosis not present

## 2017-05-08 NOTE — Patient Outreach (Signed)
[place] [facility]) Care Management  05/08/17  Darrell Johnson 11/12/1965 [id number]  Successful outreach completed with patient. Patient identification verified.  Unable to speak with patient long because he was in the store. He denies any problems or complaints at present, but requested assistance with getting patches to quit smoking. RNCM encouraged patient to call pharmacist to assist with medication needs. He stated he had not tried to call her yet because his phone wasn't working and he did not get his wife's text with the information. RNCM provided patient with contact information for [name], [facility] pharmacist for a second time and encouraged him to reach out to her.   Patient stated that he did get a new phone today.  No other questions or concerns noted. RNCM encouraged patient to call with any needs and patient verbalized understanding. Will follow up with patient within the next month.  [place] R. Lucion Dilger, RN, BSN, [place] Management Coordinator [phone]

## 2017-05-11 ENCOUNTER — Other Ambulatory Visit: Payer: Self-pay | Admitting: Pharmacist

## 2017-05-11 DIAGNOSIS — D631 Anemia in chronic kidney disease: Secondary | ICD-10-CM | POA: Diagnosis not present

## 2017-05-11 DIAGNOSIS — N2581 Secondary hyperparathyroidism of renal origin: Secondary | ICD-10-CM | POA: Diagnosis not present

## 2017-05-11 DIAGNOSIS — N186 End stage renal disease: Secondary | ICD-10-CM | POA: Diagnosis not present

## 2017-05-11 NOTE — Patient Outreach (Signed)
[name] [facility]) Care Management  05/11/2017  Darrell Johnson [birth year] [id number]   Called patient to follow up with him. HIPAA identifiers were obtained. Patient is a 51 year old male with multiple medical conditions including but not limited to:  CHF, ESRD on hemodialysis, chronic anemia, and well controlled HIV.  Patient has been hospitalized multiple times over the past few months. In addition, he has an a non-operational phone and has been very hard to reach.  At the time of the call, patient was on his way home from dialysis and could not review his medications.  Patient said he was not experiencing any problems with his medications and felt like he was handling them well on his own.  However, the patient did ask if he could get any assistance getting nicotine patches.  Unfortunately, nicotine patches are not covered by the patient's insurance and there is not a patient assistance program that provides nicotine patches.   Plan: Close patient's pharmacy case. Patient has [facility] Pharmacist's number and communicated understanding that he can call with any futures medication related concerns.  Elayne Guerin, PharmD, [place] Clinical Pharmacist [phone]

## 2017-05-12 ENCOUNTER — Institutional Professional Consult (permissible substitution): Payer: Self-pay | Admitting: Emergency Medicine

## 2017-05-12 ENCOUNTER — Ambulatory Visit (HOSPITAL_COMMUNITY)
Admission: RE | Admit: 2017-05-12 | Discharge: 2017-05-12 | Disposition: A | Discharge status: Home / Self Care | Payer: Medicare Other | Source: Ambulatory Visit | Attending: Physician Assistant | Admitting: Physician Assistant

## 2017-05-12 ENCOUNTER — Encounter (INDEPENDENT_AMBULATORY_CARE_PROVIDER_SITE_OTHER): Payer: Self-pay | Admitting: Physician Assistant

## 2017-05-12 ENCOUNTER — Other Ambulatory Visit: Payer: Self-pay | Admitting: Internal Medicine

## 2017-05-12 ENCOUNTER — Ambulatory Visit (INDEPENDENT_AMBULATORY_CARE_PROVIDER_SITE_OTHER): Payer: Medicare Other | Admitting: Physician Assistant

## 2017-05-12 VITALS — BP 157/80 | HR 83 | Temp 97.8°F | Wt 246.2 lb

## 2017-05-12 DIAGNOSIS — R0602 Shortness of breath: Secondary | ICD-10-CM | POA: Insufficient documentation

## 2017-05-12 DIAGNOSIS — J323 Chronic sphenoidal sinusitis: Secondary | ICD-10-CM

## 2017-05-12 DIAGNOSIS — R05 Cough: Secondary | ICD-10-CM | POA: Diagnosis not present

## 2017-05-12 DIAGNOSIS — Z8781 Personal history of (healed) traumatic fracture: Secondary | ICD-10-CM | POA: Diagnosis not present

## 2017-05-12 DIAGNOSIS — R918 Other nonspecific abnormal finding of lung field: Secondary | ICD-10-CM | POA: Diagnosis not present

## 2017-05-12 DIAGNOSIS — I10 Essential (primary) hypertension: Secondary | ICD-10-CM

## 2017-05-12 DIAGNOSIS — R059 Cough, unspecified: Secondary | ICD-10-CM

## 2017-05-12 MED ORDER — GUAIFENESIN ER 1200 MG PO TB12: 1.0000 | ORAL_TABLET | Freq: Two times a day (BID) | ORAL | 0 refills | Status: AC

## 2017-05-12 MED ORDER — ALBUTEROL SULFATE HFA 108 (90 BASE) MCG/ACT IN AERS: 2.0000 | INHALATION_SPRAY | RESPIRATORY_TRACT | 5 refills | Status: DC | PRN

## 2017-05-12 MED ORDER — HYDRALAZINE HCL 100 MG PO TABS: 50.0000 mg | ORAL_TABLET | Freq: Three times a day (TID) | ORAL | 0 refills | Status: DC

## 2017-05-12 MED ORDER — AMOXICILLIN-POT CLAVULANATE 875-125 MG PO TABS: 1.0000 | ORAL_TABLET | Freq: Two times a day (BID) | ORAL | 0 refills | Status: DC

## 2017-05-12 MED ORDER — DM-APAP-CPM 15-500-2 MG PO TABS: 2.0000 | ORAL_TABLET | Freq: Four times a day (QID) | ORAL | 0 refills | Status: AC

## 2017-05-12 MED ORDER — ISOSORBIDE DINITRATE 20 MG PO TABS: 20.0000 mg | ORAL_TABLET | Freq: Three times a day (TID) | ORAL | 5 refills | Status: DC

## 2017-05-12 MED ORDER — LISINOPRIL 10 MG PO TABS: 10.0000 mg | ORAL_TABLET | Freq: Every day | ORAL | 0 refills | Status: DC

## 2017-05-12 NOTE — Progress Notes (Signed)
Subjective:  Patient ID: Darrell Johnson, male    DOB: June 09, 1966  Age: 51 y.o. MRN: [id number]  CC: HTN  HPI DIONTE BLAUSTEIN is a 50 y.o. male with a medical history of anemia, anxiety, asthma, CHF, ESRD, Gout, Syphillis, HIV, HTN, HLD, PNA, rib fractures, seizures, and tobacco abuse presents as a new patient for complaint of SOB, chills, sinusitis, nasal congestion, and cough. No close contacts with the same. Has not taken anything for relief. Does not endorse CP, palpitations, SOB, HA, abdominal pain, fever, nausea, vomiting, rash, or GI/GU sxs.           Outpatient Medications Prior to Visit  Medication Sig Dispense Refill  . acetaminophen (TYLENOL) 500 MG tablet Take 1,000 mg by mouth every 6 (six) hours as needed for mild pain.    Marland Kitchen albuterol (PROVENTIL HFA;VENTOLIN HFA) 108 (90 Base) MCG/ACT inhaler Inhale 2 puffs into the lungs every 6 (six) hours as needed for wheezing or shortness of breath. 1 Inhaler 2  . albuterol (PROVENTIL) (2.5 MG/3ML) 0.083% nebulizer solution Take 3 mLs (2.5 mg total) by nebulization every 6 (six) hours as needed for wheezing or shortness of breath. 75 mL 12  . buPROPion (WELLBUTRIN) 75 MG tablet TAKE 1/2 TABLET TWICE A DAY 60 tablet 1  . carvedilol (COREG) 25 MG tablet Take 1 tablet (25 mg total) by mouth 2 (two) times daily with a meal. 60 tablet 3  . EDURANT 25 MG TABS tablet TAKE 1 TABLET (25 MG TOTAL) BY MOUTH DAILY WITH BREAKFAST. 30 tablet 10  . furosemide (LASIX) 40 MG tablet TAKE 1 TABLET (40 MG TOTAL) BY MOUTH AS NEEDED FOR FLUID OR EDEMA. 30 tablet 2  . hydrALAZINE (APRESOLINE) 100 MG tablet Take 1 tablet (100 mg total) by mouth 3 (three) times daily. 90 tablet 0  . montelukast (SINGULAIR) 10 MG tablet Take 1 tablet (10 mg total) by mouth at bedtime. 30 tablet 2  . pravastatin (PRAVACHOL) 40 MG tablet TAKE 1 TABLET (40 MG TOTAL) BY MOUTH EVERY EVENING. 90 tablet 3  . TIVICAY 50 MG tablet TAKE 1 TABLET (50 MG TOTAL) BY MOUTH DAILY. 30 tablet  10  . VIREAD 300 MG tablet TAKE 1 TABLET BY MOUTH ONCE A WEEK (Patient taking differently: TAKE 1 TABLET BY MOUTH ONCE A WEEK ON SUNDAYS) 4 tablet 10  . amLODipine (NORVASC) 10 MG tablet Take 1 tablet (10 mg total) by mouth daily. (Patient not taking: Reported on 05/12/2017) 30 tablet 0  . doxazosin (CARDURA) 8 MG tablet Take 1 tablet (8 mg total) by mouth at bedtime. (Patient not taking: Reported on 05/12/2017) 30 tablet 3  . ferric citrate (AURYXIA) 1 GM 210 MG(Fe) tablet Take 420 mg by mouth 2 (two) times daily with a meal.     . lisinopril (PRINIVIL,ZESTRIL) 10 MG tablet Take 1 tablet (10 mg total) by mouth at bedtime. (Patient not taking: Reported on 05/12/2017) 30 tablet 0   No facility-administered medications prior to visit.      ROS Review of Systems  Constitutional: Negative for chills, fever and malaise/fatigue.  Eyes: Negative for blurred vision.  Respiratory: Negative for shortness of breath.   Cardiovascular: Negative for chest pain and palpitations.  Gastrointestinal: Negative for abdominal pain and nausea.  Genitourinary: Negative for dysuria and hematuria.  Musculoskeletal: Negative for joint pain and myalgias.  Skin: Negative for rash.  Neurological: Negative for tingling and headaches.  Psychiatric/Behavioral: Negative for depression. The patient is not nervous/anxious.  Objective:  BP (!) 157/80 (BP Location: Left Arm, Patient Position: Sitting, Cuff Size: Large)   Pulse 83   Temp 97.8 F (36.6 C) (Oral)   Wt 246 lb 3.2 oz (111.7 kg)   SpO2 97%   BMI 31.61 kg/m   BP/Weight 05/12/2017 04/15/2017 01/11/7627  Systolic BP 315 176 160  Diastolic BP 80 68 88  Wt. (Lbs) 246.2 227.52 -  BMI 31.61 29.21 -      Physical Exam  Constitutional: He is oriented to person, place, and time.  Well developed, ovese, NAD, polite  HENT:  Head: Normocephalic and atraumatic.  Oropharynx mildly erythematous. Turbinates hypertrophic  Eyes: Conjunctivae are normal. No  scleral icterus.  Neck: Normal range of motion. Neck supple. No thyromegaly present.  Cardiovascular: Normal rate, regular rhythm and normal heart sounds.   Pulmonary/Chest: Effort normal and breath sounds normal. No respiratory distress. He has no wheezes. He has no rales.  Diffuse rhonchi   Abdominal: Soft. Bowel sounds are normal. There is no tenderness.  Musculoskeletal: He exhibits no edema.  Lymphadenopathy:    He has no cervical adenopathy.  Neurological: He is alert and oriented to person, place, and time. No cranial nerve deficit. Coordination normal.  Skin: Skin is warm and dry. No rash noted. No erythema. No pallor.  Psychiatric: He has a normal mood and affect. His behavior is normal. Thought content normal.  Vitals reviewed.    Assessment & Plan:    1. Chronic sphenoidal sinusitis -Begin amoxicillin-clavulanate (AUGMENTIN) 875-125 MG tablet; Take 1 tablet by mouth 2 (two) times daily.  Dispense: 20 tablet; Refill: 0 -Begin DM-APAP-CPM (CORICIDIN HBP FLU) 15-500-2 MG TABS; Take 2 tablets by mouth every 6 (six) hours.  Dispense: 1 each; Refill: 0 - Begin Guaifenesin (MUCINEX MAXIMUM STRENGTH) 1200 MG TB12; Take 1 tablet (1,200 mg total) by mouth 2 (two) times daily.  Dispense: 10 tablet; Refill: 0  2. Shortness of breath - DG Chest 2 View; Future -Begin albuterol (PROVENTIL HFA;VENTOLIN HFA) 108 (90 Base) MCG/ACT inhaler; Inhale 2 puffs into the lungs every 4 (four) hours as needed for wheezing or shortness of breath.  Dispense: 1 Inhaler; Refill: 5  3. Cough -Begin DM-APAP-CPM (CORICIDIN HBP FLU) 15-500-2 MG TABS; Take 2 tablets by mouth every 6 (six) hours.  Dispense: 1 each; Refill: 0  4. Hypertension, unspecified type - Refill lisinopril (PRINIVIL,ZESTRIL) 10 MG tablet; Take 1 tablet (10 mg total) by mouth at bedtime.  Dispense: 30 tablet; Refill: 0 -Refill hydrALAZINE (APRESOLINE) 100 MG tablet; Take 0.5 tablets (50 mg total) by mouth 3 (three) times daily.  Dispense:  90 tablet; Refill: 0 -Begin isosorbide dinitrate (ISORDIL) 20 MG tablet; Take 1 tablet (20 mg total) by mouth 3 (three) times daily.  Dispense: 90 tablet; Refill: 5   Meds ordered this encounter  Medications  . albuterol (PROVENTIL HFA;VENTOLIN HFA) 108 (90 Base) MCG/ACT inhaler    Sig: Inhale 2 puffs into the lungs every 4 (four) hours as needed for wheezing or shortness of breath.    Dispense:  1 Inhaler    Refill:  5    Order Specific Question:   Supervising Provider    Answer:   Tresa Garter [id number]  . amoxicillin-clavulanate (AUGMENTIN) 875-125 MG tablet    Sig: Take 1 tablet by mouth 2 (two) times daily.    Dispense:  20 tablet    Refill:  0    Order Specific Question:   Supervising Provider    Answer:   Angelica Chessman  E [[id number]]  . DM-APAP-CPM (CORICIDIN HBP FLU) 15-500-2 MG TABS    Sig: Take 2 tablets by mouth every 6 (six) hours.    Dispense:  1 each    Refill:  0    Order Specific Question:   Supervising Provider    Answer:   Tresa Garter [id number]  . Guaifenesin (MUCINEX MAXIMUM STRENGTH) 1200 MG TB12    Sig: Take 1 tablet (1,200 mg total) by mouth 2 (two) times daily.    Dispense:  10 tablet    Refill:  0    Order Specific Question:   Supervising Provider    Answer:   Tresa Garter [id number]  . lisinopril (PRINIVIL,ZESTRIL) 10 MG tablet    Sig: Take 1 tablet (10 mg total) by mouth at bedtime.    Dispense:  30 tablet    Refill:  0    Order Specific Question:   Supervising Provider    Answer:   Tresa Garter [id number]  . hydrALAZINE (APRESOLINE) 100 MG tablet    Sig: Take 0.5 tablets (50 mg total) by mouth 3 (three) times daily.    Dispense:  90 tablet    Refill:  0    Order Specific Question:   Supervising Provider    Answer:   Tresa Garter [id number]  . isosorbide dinitrate (ISORDIL) 20 MG tablet    Sig: Take 1 tablet (20 mg total) by mouth 3 (three) times daily.    Dispense:  90 tablet    Refill:  5    Order  Specific Question:   Supervising Provider    Answer:   Tresa Garter [[id number]]    Follow-up: 4 weeks for HTN  Clent Demark PA

## 2017-05-12 NOTE — Patient Instructions (Signed)

## 2017-05-13 ENCOUNTER — Telehealth: Payer: Self-pay

## 2017-05-13 DIAGNOSIS — N2581 Secondary hyperparathyroidism of renal origin: Secondary | ICD-10-CM | POA: Diagnosis not present

## 2017-05-13 DIAGNOSIS — N186 End stage renal disease: Secondary | ICD-10-CM | POA: Diagnosis not present

## 2017-05-13 DIAGNOSIS — D631 Anemia in chronic kidney disease: Secondary | ICD-10-CM | POA: Diagnosis not present

## 2017-05-13 NOTE — Telephone Encounter (Signed)
Patient aware of Xray results. [name], CMA

## 2017-05-13 NOTE — Telephone Encounter (Signed)
-----   Message from Clent Demark, PA-C sent at 05/13/2017 10:56 AM EDT ----- Chest xray shows mild abnormalities but better than previous. Take the antibiotic I prescribed. Return soon if SOB worsens.

## 2017-05-15 DIAGNOSIS — N2581 Secondary hyperparathyroidism of renal origin: Secondary | ICD-10-CM | POA: Diagnosis not present

## 2017-05-15 DIAGNOSIS — D631 Anemia in chronic kidney disease: Secondary | ICD-10-CM | POA: Diagnosis not present

## 2017-05-15 DIAGNOSIS — N186 End stage renal disease: Secondary | ICD-10-CM | POA: Diagnosis not present

## 2017-05-18 DIAGNOSIS — N186 End stage renal disease: Secondary | ICD-10-CM | POA: Diagnosis not present

## 2017-05-18 DIAGNOSIS — N2581 Secondary hyperparathyroidism of renal origin: Secondary | ICD-10-CM | POA: Diagnosis not present

## 2017-05-18 DIAGNOSIS — D631 Anemia in chronic kidney disease: Secondary | ICD-10-CM | POA: Diagnosis not present

## 2017-05-20 DIAGNOSIS — I120 Hypertensive chronic kidney disease with stage 5 chronic kidney disease or end stage renal disease: Secondary | ICD-10-CM | POA: Diagnosis not present

## 2017-05-20 DIAGNOSIS — N2581 Secondary hyperparathyroidism of renal origin: Secondary | ICD-10-CM | POA: Diagnosis not present

## 2017-05-20 DIAGNOSIS — Z992 Dependence on renal dialysis: Secondary | ICD-10-CM | POA: Diagnosis not present

## 2017-05-20 DIAGNOSIS — N186 End stage renal disease: Secondary | ICD-10-CM | POA: Diagnosis not present

## 2017-05-20 DIAGNOSIS — D631 Anemia in chronic kidney disease: Secondary | ICD-10-CM | POA: Diagnosis not present

## 2017-05-22 DIAGNOSIS — D631 Anemia in chronic kidney disease: Secondary | ICD-10-CM | POA: Diagnosis not present

## 2017-05-22 DIAGNOSIS — N2581 Secondary hyperparathyroidism of renal origin: Secondary | ICD-10-CM | POA: Diagnosis not present

## 2017-05-22 DIAGNOSIS — N186 End stage renal disease: Secondary | ICD-10-CM | POA: Diagnosis not present

## 2017-05-25 ENCOUNTER — Other Ambulatory Visit: Payer: Self-pay | Admitting: Internal Medicine

## 2017-05-25 DIAGNOSIS — N186 End stage renal disease: Secondary | ICD-10-CM | POA: Diagnosis not present

## 2017-05-25 DIAGNOSIS — D631 Anemia in chronic kidney disease: Secondary | ICD-10-CM | POA: Diagnosis not present

## 2017-05-25 DIAGNOSIS — N2581 Secondary hyperparathyroidism of renal origin: Secondary | ICD-10-CM | POA: Diagnosis not present

## 2017-05-25 DIAGNOSIS — I10 Essential (primary) hypertension: Secondary | ICD-10-CM

## 2017-05-27 DIAGNOSIS — D631 Anemia in chronic kidney disease: Secondary | ICD-10-CM | POA: Diagnosis not present

## 2017-05-27 DIAGNOSIS — N2581 Secondary hyperparathyroidism of renal origin: Secondary | ICD-10-CM | POA: Diagnosis not present

## 2017-05-27 DIAGNOSIS — N186 End stage renal disease: Secondary | ICD-10-CM | POA: Diagnosis not present

## 2017-05-29 DIAGNOSIS — N2581 Secondary hyperparathyroidism of renal origin: Secondary | ICD-10-CM | POA: Diagnosis not present

## 2017-05-29 DIAGNOSIS — D631 Anemia in chronic kidney disease: Secondary | ICD-10-CM | POA: Diagnosis not present

## 2017-05-29 DIAGNOSIS — N186 End stage renal disease: Secondary | ICD-10-CM | POA: Diagnosis not present

## 2017-06-01 DIAGNOSIS — N186 End stage renal disease: Secondary | ICD-10-CM | POA: Diagnosis not present

## 2017-06-01 DIAGNOSIS — D631 Anemia in chronic kidney disease: Secondary | ICD-10-CM | POA: Diagnosis not present

## 2017-06-01 DIAGNOSIS — N2581 Secondary hyperparathyroidism of renal origin: Secondary | ICD-10-CM | POA: Diagnosis not present

## 2017-06-03 DIAGNOSIS — D631 Anemia in chronic kidney disease: Secondary | ICD-10-CM | POA: Diagnosis not present

## 2017-06-03 DIAGNOSIS — N2581 Secondary hyperparathyroidism of renal origin: Secondary | ICD-10-CM | POA: Diagnosis not present

## 2017-06-03 DIAGNOSIS — N186 End stage renal disease: Secondary | ICD-10-CM | POA: Diagnosis not present

## 2017-06-05 ENCOUNTER — Ambulatory Visit: Payer: Self-pay

## 2017-06-05 DIAGNOSIS — D631 Anemia in chronic kidney disease: Secondary | ICD-10-CM | POA: Diagnosis not present

## 2017-06-05 DIAGNOSIS — N186 End stage renal disease: Secondary | ICD-10-CM | POA: Diagnosis not present

## 2017-06-05 DIAGNOSIS — N2581 Secondary hyperparathyroidism of renal origin: Secondary | ICD-10-CM | POA: Diagnosis not present

## 2017-06-07 DIAGNOSIS — N2581 Secondary hyperparathyroidism of renal origin: Secondary | ICD-10-CM | POA: Diagnosis not present

## 2017-06-07 DIAGNOSIS — D631 Anemia in chronic kidney disease: Secondary | ICD-10-CM | POA: Diagnosis not present

## 2017-06-07 DIAGNOSIS — N186 End stage renal disease: Secondary | ICD-10-CM | POA: Diagnosis not present

## 2017-06-08 ENCOUNTER — Other Ambulatory Visit (INDEPENDENT_AMBULATORY_CARE_PROVIDER_SITE_OTHER): Payer: Self-pay | Admitting: Physician Assistant

## 2017-06-08 ENCOUNTER — Other Ambulatory Visit: Payer: Self-pay

## 2017-06-08 DIAGNOSIS — I10 Essential (primary) hypertension: Secondary | ICD-10-CM

## 2017-06-08 NOTE — Telephone Encounter (Signed)
FWD to PCP. Tempestt S Roberts, CMA  

## 2017-06-08 NOTE — Patient Outreach (Signed)
[place] [facility]) Care Management  06/08/17  Darrell Johnson Dec 03, 1965 [id number]  Successful outreach completed with patient. Patient identification verified.   Patient stated that he has been doing "pretty good." He reported that he recently went to a new doctor for sinus and mucus issues. He stated that he put him on an antibiotic, cough medicine and mucinex and he is feeling a lot better. (Per records review, patient saw [name], PA at [place] on 05/12/2017 for complaints of shortness of breath, chills, sinusitis, nasal congestion, and cough. He was noted to have diffuse rhonchi and diagnosed with chronic sphenoidal sinusitis. Patient was placed on augmentin 875-125 x 10 days, coricidin, guaifenesen 1200 mg and albulterol inhaler. He is to follow up in 4 weeks for HTN).  Patient denies any shortness of breath, edema, or chest pain. He stated that he is feeling a lot better since taking the antibiotics and is now able to get out of the house more. He denies any problems related to his admissions for flash edema. He stated he's doing pretty good.   Patient has no concerns or needs at present. Will follow up with patient in 1 month to reassess.  [place] R. Cuauhtemoc Huegel, RN, BSN, [place] Management Coordinator [phone]

## 2017-06-09 DIAGNOSIS — D631 Anemia in chronic kidney disease: Secondary | ICD-10-CM | POA: Diagnosis not present

## 2017-06-09 DIAGNOSIS — N2581 Secondary hyperparathyroidism of renal origin: Secondary | ICD-10-CM | POA: Diagnosis not present

## 2017-06-09 DIAGNOSIS — N186 End stage renal disease: Secondary | ICD-10-CM | POA: Diagnosis not present

## 2017-06-12 DIAGNOSIS — N2581 Secondary hyperparathyroidism of renal origin: Secondary | ICD-10-CM | POA: Diagnosis not present

## 2017-06-12 DIAGNOSIS — D631 Anemia in chronic kidney disease: Secondary | ICD-10-CM | POA: Diagnosis not present

## 2017-06-12 DIAGNOSIS — N186 End stage renal disease: Secondary | ICD-10-CM | POA: Diagnosis not present

## 2017-06-15 DIAGNOSIS — N186 End stage renal disease: Secondary | ICD-10-CM | POA: Diagnosis not present

## 2017-06-15 DIAGNOSIS — D631 Anemia in chronic kidney disease: Secondary | ICD-10-CM | POA: Diagnosis not present

## 2017-06-15 DIAGNOSIS — N2581 Secondary hyperparathyroidism of renal origin: Secondary | ICD-10-CM | POA: Diagnosis not present

## 2017-06-17 DIAGNOSIS — D631 Anemia in chronic kidney disease: Secondary | ICD-10-CM | POA: Diagnosis not present

## 2017-06-17 DIAGNOSIS — N2581 Secondary hyperparathyroidism of renal origin: Secondary | ICD-10-CM | POA: Diagnosis not present

## 2017-06-17 DIAGNOSIS — N186 End stage renal disease: Secondary | ICD-10-CM | POA: Diagnosis not present

## 2017-06-19 DIAGNOSIS — I120 Hypertensive chronic kidney disease with stage 5 chronic kidney disease or end stage renal disease: Secondary | ICD-10-CM | POA: Diagnosis not present

## 2017-06-19 DIAGNOSIS — N2581 Secondary hyperparathyroidism of renal origin: Secondary | ICD-10-CM | POA: Diagnosis not present

## 2017-06-19 DIAGNOSIS — D631 Anemia in chronic kidney disease: Secondary | ICD-10-CM | POA: Diagnosis not present

## 2017-06-19 DIAGNOSIS — N186 End stage renal disease: Secondary | ICD-10-CM | POA: Diagnosis not present

## 2017-06-19 DIAGNOSIS — Z992 Dependence on renal dialysis: Secondary | ICD-10-CM | POA: Diagnosis not present

## 2017-06-22 DIAGNOSIS — N2581 Secondary hyperparathyroidism of renal origin: Secondary | ICD-10-CM | POA: Diagnosis not present

## 2017-06-22 DIAGNOSIS — N186 End stage renal disease: Secondary | ICD-10-CM | POA: Diagnosis not present

## 2017-06-22 DIAGNOSIS — D631 Anemia in chronic kidney disease: Secondary | ICD-10-CM | POA: Diagnosis not present

## 2017-06-24 DIAGNOSIS — N2581 Secondary hyperparathyroidism of renal origin: Secondary | ICD-10-CM | POA: Diagnosis not present

## 2017-06-24 DIAGNOSIS — N186 End stage renal disease: Secondary | ICD-10-CM | POA: Diagnosis not present

## 2017-06-24 DIAGNOSIS — D631 Anemia in chronic kidney disease: Secondary | ICD-10-CM | POA: Diagnosis not present

## 2017-06-26 DIAGNOSIS — N186 End stage renal disease: Secondary | ICD-10-CM | POA: Diagnosis not present

## 2017-06-26 DIAGNOSIS — N2581 Secondary hyperparathyroidism of renal origin: Secondary | ICD-10-CM | POA: Diagnosis not present

## 2017-06-26 DIAGNOSIS — D631 Anemia in chronic kidney disease: Secondary | ICD-10-CM | POA: Diagnosis not present

## 2017-06-29 ENCOUNTER — Other Ambulatory Visit: Payer: Self-pay

## 2017-06-29 DIAGNOSIS — D631 Anemia in chronic kidney disease: Secondary | ICD-10-CM | POA: Diagnosis not present

## 2017-06-29 DIAGNOSIS — N186 End stage renal disease: Secondary | ICD-10-CM | POA: Diagnosis not present

## 2017-06-29 DIAGNOSIS — N2581 Secondary hyperparathyroidism of renal origin: Secondary | ICD-10-CM | POA: Diagnosis not present

## 2017-06-29 NOTE — Patient Outreach (Signed)
[place] [facility]) Care Management  06/29/17  CAI ANFINSON Oct 06, 1965 [id number]  Successful outreach completed with patient. Patient identification verified.    Has had headace  Is not able to check his blood pressure - had a cuff but does not know where it is anymore  Has to order one montelukast - called it in last month; has been out x 3 weeks; does not know why sent it yet, has to be seen before can get a refill   ? Sinus trying to get social security card - changing insurance because [facility] will pay for glasses; got cataracts, may try to get those taken care of first of the year Saw about 7 months ago CPAP does not have Have you ever had  10 pm 1 am Does not sleep very much May nap a little

## 2017-07-01 DIAGNOSIS — N2581 Secondary hyperparathyroidism of renal origin: Secondary | ICD-10-CM | POA: Diagnosis not present

## 2017-07-01 DIAGNOSIS — N186 End stage renal disease: Secondary | ICD-10-CM | POA: Diagnosis not present

## 2017-07-01 DIAGNOSIS — D631 Anemia in chronic kidney disease: Secondary | ICD-10-CM | POA: Diagnosis not present

## 2017-07-03 DIAGNOSIS — N186 End stage renal disease: Secondary | ICD-10-CM | POA: Diagnosis not present

## 2017-07-03 DIAGNOSIS — N2581 Secondary hyperparathyroidism of renal origin: Secondary | ICD-10-CM | POA: Diagnosis not present

## 2017-07-03 DIAGNOSIS — D631 Anemia in chronic kidney disease: Secondary | ICD-10-CM | POA: Diagnosis not present

## 2017-07-06 DIAGNOSIS — N186 End stage renal disease: Secondary | ICD-10-CM | POA: Diagnosis not present

## 2017-07-06 DIAGNOSIS — D631 Anemia in chronic kidney disease: Secondary | ICD-10-CM | POA: Diagnosis not present

## 2017-07-06 DIAGNOSIS — N2581 Secondary hyperparathyroidism of renal origin: Secondary | ICD-10-CM | POA: Diagnosis not present

## 2017-07-08 ENCOUNTER — Other Ambulatory Visit (INDEPENDENT_AMBULATORY_CARE_PROVIDER_SITE_OTHER): Payer: Self-pay | Admitting: Physician Assistant

## 2017-07-08 DIAGNOSIS — N186 End stage renal disease: Secondary | ICD-10-CM | POA: Diagnosis not present

## 2017-07-08 DIAGNOSIS — D631 Anemia in chronic kidney disease: Secondary | ICD-10-CM | POA: Diagnosis not present

## 2017-07-08 DIAGNOSIS — I10 Essential (primary) hypertension: Secondary | ICD-10-CM

## 2017-07-08 DIAGNOSIS — N2581 Secondary hyperparathyroidism of renal origin: Secondary | ICD-10-CM | POA: Diagnosis not present

## 2017-07-08 NOTE — Telephone Encounter (Signed)
FWD to PCP. Tempestt S Roberts, CMA  

## 2017-07-09 ENCOUNTER — Other Ambulatory Visit: Payer: Self-pay

## 2017-07-09 NOTE — Patient Outreach (Addendum)
[place] [facility]) Care Management  07/09/2017  VIMAL DEREGO 06/13/1966 [id number]    Referral received 07/08/2017:  51 year old with history of ESRD/HD (Monday,Wednesday Friday); heart failure, chronic sinusitis, HIV, tobacco use, gout, asthma. Client states he has a limited time to talk at this time.  RNCM called to introduce self and arrange home visit. Client agrees to Care management services with [facility]. Client reports no specific needs, but an issue surrounding getting an intermittent medication that starts with an "m".   Due to limited time to talk, conversation primarily about medications. Medications reviewed telephonically. Client reports he has all his medications, but states he intermittently takes an antibiotic that starts with an "m". And states he may need a refill. He is unable to tell RNCM the name of the medication. He states he has the bottle at home and will check to see if he has refill. RNCM encouraged client to call RNCM or [facility] Pharmacist that was previously involved in his care if he has any issues after following up with his provider.  Plan: Home visit scheduled to assess care management needs.   [name], RN, MSN, [place] Coordinator Cell: [phone] .

## 2017-07-10 ENCOUNTER — Emergency Department (HOSPITAL_COMMUNITY)
Admission: EM | Admit: 2017-07-10 | Discharge: 2017-07-10 | Disposition: A | Discharge status: Home / Self Care | Payer: Medicare Other | Attending: Emergency Medicine | Admitting: Emergency Medicine

## 2017-07-10 ENCOUNTER — Other Ambulatory Visit: Payer: Self-pay

## 2017-07-10 DIAGNOSIS — I132 Hypertensive heart and chronic kidney disease with heart failure and with stage 5 chronic kidney disease, or end stage renal disease: Secondary | ICD-10-CM | POA: Insufficient documentation

## 2017-07-10 DIAGNOSIS — Z79899 Other long term (current) drug therapy: Secondary | ICD-10-CM | POA: Insufficient documentation

## 2017-07-10 DIAGNOSIS — D631 Anemia in chronic kidney disease: Secondary | ICD-10-CM | POA: Diagnosis not present

## 2017-07-10 DIAGNOSIS — E1122 Type 2 diabetes mellitus with diabetic chronic kidney disease: Secondary | ICD-10-CM | POA: Insufficient documentation

## 2017-07-10 DIAGNOSIS — I951 Orthostatic hypotension: Secondary | ICD-10-CM | POA: Diagnosis not present

## 2017-07-10 DIAGNOSIS — I509 Heart failure, unspecified: Secondary | ICD-10-CM | POA: Insufficient documentation

## 2017-07-10 DIAGNOSIS — N186 End stage renal disease: Secondary | ICD-10-CM | POA: Diagnosis not present

## 2017-07-10 DIAGNOSIS — F17210 Nicotine dependence, cigarettes, uncomplicated: Secondary | ICD-10-CM | POA: Diagnosis not present

## 2017-07-10 DIAGNOSIS — N2581 Secondary hyperparathyroidism of renal origin: Secondary | ICD-10-CM | POA: Diagnosis not present

## 2017-07-10 DIAGNOSIS — J45909 Unspecified asthma, uncomplicated: Secondary | ICD-10-CM | POA: Diagnosis not present

## 2017-07-10 DIAGNOSIS — R55 Syncope and collapse: Secondary | ICD-10-CM | POA: Diagnosis present

## 2017-07-10 DIAGNOSIS — R404 Transient alteration of awareness: Secondary | ICD-10-CM | POA: Diagnosis not present

## 2017-07-10 DIAGNOSIS — R531 Weakness: Secondary | ICD-10-CM | POA: Diagnosis not present

## 2017-07-10 LAB — BASIC METABOLIC PANEL
Anion gap: 14 (ref 5–15)
BUN: 24 mg/dL — ABNORMAL HIGH (ref 6–20)
CALCIUM: 9.5 mg/dL (ref 8.9–10.3)
CHLORIDE: 94 mmol/L — AB (ref 101–111)
CO2: 27 mmol/L (ref 22–32)
CREATININE: 7.44 mg/dL — AB (ref 0.61–1.24)
GFR calc non Af Amer: 7 mL/min — ABNORMAL LOW (ref >60)
GFR, EST AFRICAN AMERICAN: 9 mL/min — AB (ref >60)
GLUCOSE: 93 mg/dL (ref 65–99)
Potassium: 4.1 mmol/L (ref 3.5–5.1)
Sodium: 135 mmol/L (ref 135–145)

## 2017-07-10 LAB — CBC
HEMATOCRIT: 39.6 % (ref 39.0–52.0)
HEMOGLOBIN: 13 g/dL (ref 13.0–17.0)
MCH: 29.1 pg (ref 26.0–34.0)
MCHC: 32.8 g/dL (ref 30.0–36.0)
MCV: 88.8 fL (ref 78.0–100.0)
Platelets: 169 10*3/uL (ref 150–400)
RBC: 4.46 MIL/uL (ref 4.22–5.81)
RDW: 16.9 % — AB (ref 11.5–15.5)
WBC: 5.8 10*3/uL (ref 4.0–10.5)

## 2017-07-10 MED ORDER — SODIUM CHLORIDE 0.9 % IV BOLUS (SEPSIS)
500.0000 mL | Freq: Once | INTRAVENOUS | Status: AC
  Administered 2017-07-10: 500 mL via INTRAVENOUS

## 2017-07-10 NOTE — Patient Outreach (Signed)
[facility]) Care Management  07/10/2017  Darrell Johnson 04-Jul-1966 [id number]   RNCM called to follow up. Voice message received that wireless customer was unavailable. Unable to leave message.  Plan home visit as scheduled to assess care management needs.  [name], RN, MSN, [place] Coordinator Cell: [phone]

## 2017-07-10 NOTE — Discharge Instructions (Addendum)
Make sure to drink fluids and stay hydrated today.  Follow-up with your nephrologist about your symptoms today.  Return To the ER if you have any new or worsening symptoms.

## 2017-07-10 NOTE — ED Provider Notes (Signed)
[facility] EMERGENCY DEPARTMENT Provider Note   CSN: [id number] Arrival date & time: 07/10/17  1246     History   Chief Complaint Chief Complaint  Patient presents with  . Weakness    HPI PER Darrell Johnson is a 51 y.o. male.  HPI   Patient is a 51 year old male with a history of ESRD (on dialysis MWF) presented to the ED today complaining of generalized weakness that began about 30 minutes after undergoing dialysis today.  Patient states that he was driving home from dialysis when he started feeling symptoms.  He pulled the car over and then proceeded to pass out.  Patient's friend is in the room and witnessed the event and his body became rigid.  He states that the episode lasted about 15-20 minutes. There were no clonic movements, tongue biting, or urinary incontinence.  He states that when the patient regained consciousness, he was acting like his normal self.  He did not appear groggy or confused at all following the event.  Patient states he continues to feel weak all over, denies unilateral weakness.  He also reports a mild headache, but denies any chest pain, shortness of breath, vision changes, lightheadedness, or any other symptoms.  Of note, EMS also noted that patient exhibited no postictal state on their initial evaluation.  Past Medical History:  Diagnosis Date  . Anemia   . Anxiety   . Arthritis    "knees" (04/13/2017)  . Asthma   . CHF (congestive heart failure) ([place])   . Chronic kidney disease    stage 3-4 CKD, followed by Dr. Moshe Cipro  . ESRD (end stage renal disease) ([place])    ESRD due to HTN started dialysis Feb 2016  . ESRD (end stage renal disease) on dialysis [facility])    "MWF; [facility]" (04/13/2017)  . Gout, unspecified 08/13/2009   Qualifier: Diagnosis of  By: Redmond Pulling  MD, Mateo Flow    . H1N1 influenza    March 2016  . History of blood transfusion 02/2017   "related to OR"  . History of syphilis   . HIV infection ([place]) 1980's   on  ART therapy since, followed by ID clinic, complicated  by neuropathy  . HTN (hypertension)   . Hyperlipidemia    hypertrygliceridemia determined ti be secondary to ART therpay  . Hypertension   . HYPERTENSION 05/08/2006  . 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 Pneumonia    "once; years ago" (04/13/2017)  . Rib fractures 01/2009  . Seizure disorder ([place])   . Seizures ([place])    last seizure was in the 1990s, pt has family history of seizures; "probably related to alcohol" (04/13/2017)  . 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 Syphilis 1997   history of syphilis 1997  . Tobacco abuse     Patient Active Problem List   Diagnosis Date Noted  . Moderate mitral regurgitation   . Acute exacerbation of CHF (congestive heart failure) ([place]) 04/12/2017  . Acute respiratory failure with hypoxia ([place]) 03/29/2017  . Scrotal abscess 03/09/2017  . Trigger finger of right hand 09/11/2016  . Abscess of groin, right 09/04/2016  . Hyperlipidemia 03/31/2016  . Anxiety state 03/11/2016  . ESRD (end stage renal disease) ([place]) 01/28/2016  . Lumbar radiculopathy 01/28/2016  . Seasonal allergies   . Healthcare maintenance 05/03/2015  . Anemia in chronic kidney  disease 08/30/2014  . ESRD on dialysis ([place]) 12/27/2013  . Drug noncompliance 11/07/2013  . History of syphilis 11/07/2013  . Arthritis 09/09/2013  . Tobacco abuse 09/07/2013  . Flash pulmonary edema ([place]) 06/11/2013  . Chronic pain disorder 04/28/2013  . HYPERTRIGLYCERIDEMIA 11/01/2009  . Gout 08/13/2009  . ERECTILE DYSFUNCTION 06/08/2008  . HIV disease ([place]) 05/08/2006  . PERIPHERAL NEUROPATHY 05/08/2006  . Essential hypertension 05/08/2006  . SEIZURE DISORDER 05/08/2006    Past Surgical History:  Procedure Laterality Date  . AV FISTULA PLACEMENT Right 12/02/2013   Procedure: ARTERIOVENOUS (AV) FISTULA  CREATION;  Surgeon: Rosetta Posner, MD;  Location: [facility];  Service: Vascular;  Laterality: Right;  . INCISION AND DRAINAGE ABSCESS Right 03/09/2017   Procedure: INCISION AND DRAINAGE Right Scrotal Abscess;  Surgeon: Franchot Gallo, MD;  Location: [facility];  Service: Urology;  Laterality: Right;  . THROMBECTOMY Right ~ 2016   "AV fistula clotted off"       Home Medications    Prior to Admission medications   Medication Sig Start Date End Date Taking? Authorizing Provider  acetaminophen (TYLENOL) 500 MG tablet Take 1,000 mg by mouth every 6 (six) hours as needed for mild pain.   Yes [provider]  albuterol (PROVENTIL HFA;VENTOLIN HFA) 108 (90 Base) MCG/ACT inhaler Inhale 2 puffs into the lungs every 4 (four) hours as needed for wheezing or shortness of breath. 05/12/17 07/10/17 Yes Clent Demark, PA-C  amLODipine (NORVASC) 10 MG tablet Take 10 mg by mouth daily.   Yes [provider]  buPROPion (WELLBUTRIN) 75 MG tablet TAKE 1/2 TABLET TWICE A DAY Patient taking differently: TAKE 37.5mg  by mouth TWICE A DAY 04/16/17  Yes Axel Filler, MD  carvedilol (COREG) 25 MG tablet Take 1 tablet (25 mg total) by mouth 2 (two) times daily with a meal. 04/06/17  Yes Lacroce, Hulen Shouts, MD  doxazosin (CARDURA) 8 MG tablet Take 8 mg by mouth at bedtime. 07/10/17  Yes [provider]  EDURANT 25 MG TABS tablet TAKE 1 TABLET (25 MG TOTAL) BY MOUTH DAILY WITH BREAKFAST. 12/22/16  Yes Carlyle Basques, MD  ferric citrate (AURYXIA) 1 GM 210 MG(Fe) tablet Take 420 mg by mouth 2 (two) times daily with a meal.    Yes [provider]  furosemide (LASIX) 40 MG tablet TAKE 1 TABLET (40 MG TOTAL) BY MOUTH AS NEEDED FOR FLUID OR EDEMA. 05/05/17  Yes Jule Ser, DO  hydrALAZINE (APRESOLINE) 100 MG tablet TAKE 0.5 TABLETS (50 MG TOTAL) BY MOUTH 3 (THREE) TIMES DAILY. 07/08/17  Yes Clent Demark, PA-C  isosorbide dinitrate (ISORDIL) 20 MG tablet Take 1 tablet (20 mg  total) by mouth 3 (three) times daily. 05/12/17  Yes Clent Demark, PA-C  lisinopril (PRINIVIL,ZESTRIL) 10 MG tablet TAKE 1 TABLET BY MOUTH EVERYDAY AT BEDTIME Patient taking differently: TAKE 10mg  BY MOUTH EVERYDAY AT BEDTIME 06/08/17  Yes Clent Demark, PA-C  montelukast (SINGULAIR) 10 MG tablet Take 1 tablet (10 mg total) by mouth at bedtime. 12/17/16  Yes Jule Ser, DO  pravastatin (PRAVACHOL) 40 MG tablet TAKE 1 TABLET (40 MG TOTAL) BY MOUTH EVERY EVENING. 01/27/17 01/27/18 Yes Jule Ser, DO  TIVICAY 50 MG tablet TAKE 1 TABLET (50 MG TOTAL) BY MOUTH DAILY. 12/22/16  Yes Carlyle Basques, MD  VIREAD 300 MG tablet TAKE 1 TABLET BY MOUTH ONCE A WEEK Patient taking differently: TAKE 1 TABLET BY MOUTH ONCE A WEEK ON SUNDAYS 01/06/17  Yes Carlyle Basques, MD  Family History Family History  Problem Relation Age of Onset  . Cancer Mother   . Hypertension Mother   . COPD Father   . Hypertension Father   . Diabetes Sister   . Hypertension Sister   . Diabetes Brother   . Hypertension Brother   . Stroke Neg Hx     Social History Social History   Tobacco Use  . Smoking status: Current Some Day Smoker    Packs/day: 0.10    Years: 25.00    Pack years: 2.50    Types: Cigarettes    Start date: 01/10/2016    Last attempt to quit: 04/07/2016    Years since quitting: 1.2  . Smokeless tobacco: Never Used  Substance Use Topics  . Alcohol use: No    Alcohol/week: 0.0 oz    Comment: 04/13/2017 "quit ~ 2014"  . Drug use: No     Allergies   Patient has no known allergies.   Review of Systems Review of Systems  Constitutional: Negative for chills and fever.  HENT: Negative for ear pain and sore throat.        No tongue biting  Eyes: Negative for pain and visual disturbance.  Respiratory: Negative for cough and shortness of breath.   Cardiovascular: Negative for chest pain and palpitations.  Gastrointestinal: Negative for abdominal pain and vomiting.  Genitourinary:  Negative for dysuria and hematuria.       No urinary incontinence  Musculoskeletal: Negative for arthralgias and back pain.  Skin: Negative for color change and rash.  Neurological: Positive for syncope, weakness and headaches. Negative for dizziness, seizures, light-headedness and numbness.       Some muscle rigidity, no clonic movements, no postictal state  All other systems reviewed and are negative.    Physical Exam Updated Vital Signs BP (!) 106/49   Pulse 84   Temp 98.4 F (36.9 C) (Oral)   Resp 18   SpO2 (!) 88%   Physical Exam  Constitutional: He appears well-developed and well-nourished. No distress.  HENT:  Head: Normocephalic and atraumatic.  No ecchymosis or injury noted to the tongue.  Moist mucous membranes.  Eyes: Conjunctivae are normal. Pupils are equal, round, and reactive to light.  Neck: Neck supple.  Cardiovascular: Normal rate, regular rhythm and intact distal pulses.  No murmur heard. Pulmonary/Chest: Effort normal. No stridor. No respiratory distress. He has no rales.  Rare expiratory wheeze to the upper bases bilaterally.  No tachypnea  Abdominal: Soft. There is no tenderness.  Musculoskeletal: He exhibits no edema.  Mental Status:  Alert, thought content appropriate, able to give a coherent history. Speech fluent without evidence of aphasia. Able to follow 2 step commands without difficulty.  Cranial Nerves:  II:  pupils equal, round, reactive to light III,IV, VI: ptosis not present, extra-ocular motions intact bilaterally  V,VII: smile symmetric VIII: hearing grossly normal to voice  X: uvula elevates symmetrically  XI: bilateral shoulder shrug symmetric and strong XII: midline tongue extension without fassiculations Motor:  Normal tone. 5/5 strength of BUE and BLE major muscle groups including strong and equal grip strength and dorsiflexion/plantar flexion Sensory: light touch normal in all extremities. Cerebellar: normal finger-to-nose with  bilateral upper extremities CV: 2+ DP/PT pulses   Neurological: He is alert.  Skin: Skin is warm and dry.  Psychiatric: He has a normal mood and affect.  Nursing note and vitals reviewed.    ED Treatments / Results  Labs (all labs ordered are listed, but only abnormal results  are displayed) Labs Reviewed  CBC - Abnormal; Notable for the following components:      Result Value   RDW 16.9 (*)    All other components within normal limits  BASIC METABOLIC PANEL - Abnormal; Notable for the following components:   Chloride 94 (*)    BUN 24 (*)    Creatinine, Ser 7.44 (*)    GFR calc non Af Amer 7 (*)    GFR calc Af Amer 9 (*)    All other components within normal limits    EKG  EKG Interpretation  Date/Time:  Friday July 10 2017 12:47:44 EST Ventricular Rate:  87 PR Interval:    QRS Duration: 94 QT Interval:  403 QTC Calculation: 485 R Axis:   78 Text Interpretation:  Sinus rhythm Left atrial enlargement Anterior infarct, possibly acute Confirmed by Jola Schmidt [phone]) on 07/10/2017 12:57:13 PM       Radiology No results found.  Procedures Procedures (including critical care time)  Medications Ordered in ED Medications  sodium chloride 0.9 % bolus 500 mL (0 mLs Intravenous Stopped 07/10/17 1618)     Initial Impression / Assessment and Plan / ED Course  I have reviewed the triage vital signs and the nursing notes.  Pertinent labs & imaging results that were available during my care of the patient were reviewed by me and considered in my medical decision making (see chart for details).   Rechecked patient.  He was sleeping in the room comfortably.  Patient states he is feeling improved after receiving fluids.  Orthostatic vital signs were checked.  Will ambulate patient and plan for discharge.  Rechecked patient.  He reports that he feels better, and continues to deny any continued lightheadedness or weakness.  Able to ambulate in the ED down the hallway and  back without feeling lightheaded.  Discussed the plan for discharge.  Advised the patient to make sure he gets adequate hydration for the remainder of the day.  Advised him to follow-up with his nephrologist about his symptoms today.  Advised him to return to the ER if he has any new or worsening symptoms.  Patient understands.  All questions answered.  Final Clinical Impressions(s) / ED Diagnoses   Final diagnoses:  Orthostatic hypotension  Syncope, unspecified syncope type   Patient with a history of ESRD presented to the ED complaining of generalized weakness and had a witnessed syncopal episode.  Low concern for seizure given history of event and PEx.  Pt Hypotensive in the ED.  Fluids given and patient improved.  Labs were normal.  EKG showed some ST elevation, however this EKG is similar to previous.  He denies any chest pain or shortness of breath in the ED. negative troponin.  Patient was orthostatic in the ED.  However was able to ambulate prior to discharge with no issue.  Advised increased hydration today and that he follow-up with his nephrologist about the symptoms.  Return precautions given prior to DC.  ED Discharge Orders    None       Bishop Dublin 07/10/17 [place], MD 07/10/17 2259

## 2017-07-10 NOTE — ED Triage Notes (Signed)
Pt arrived via [facility] after experiencing weakness following dialysis today. Pt was in a vehicle with his friend when pt states he became weak and exhibited what the friends described as "seizure activity" for 15-20 mins, Per EMS. EMS stated they did nopt see any evidence of seizure activity or post-ictal presentation. Pt was alert and oriented for EMS. Pt is alert and oriented x4 at this time. Pt denies cp but feels better on 2LPM oxygen. EMS EKG showed abnormal tracing, but EDP [place] stated abnormal EKG is similar to previous EKG, no Code STEMI called at time of arrival to ED

## 2017-07-12 DIAGNOSIS — D631 Anemia in chronic kidney disease: Secondary | ICD-10-CM | POA: Diagnosis not present

## 2017-07-12 DIAGNOSIS — N186 End stage renal disease: Secondary | ICD-10-CM | POA: Diagnosis not present

## 2017-07-12 DIAGNOSIS — N2581 Secondary hyperparathyroidism of renal origin: Secondary | ICD-10-CM | POA: Diagnosis not present

## 2017-07-13 ENCOUNTER — Other Ambulatory Visit: Payer: Self-pay

## 2017-07-13 NOTE — Patient Outreach (Signed)
[facility]) Care Management  07/13/2017  Darrell Johnson 1966-02-09 [id number]   RNCM called to follow up and complete transition of care call. Client presented to the emergency room on 12/21 due to orthostatic hypotension/syncopy post dialysis. Client denies any questions or concerns at this time.   He states he is now being seen at a new clinic on [address] by Dr. Marica Otter. He is not able to give the first name, but states he will get the information. He states he is going to call to schedule a follow up appointment with Dr. Marica Otter and he will also follow up on the antibiotic medication for his sinuses with Dr. Marica Otter.  Client to call RNCM as needed. RNCM also reinforced 24 hour nurse advice line.  Plan: home visit previously scheduled.  [name], RN, MSN, [place] Coordinator Cell: [phone]

## 2017-07-15 DIAGNOSIS — N186 End stage renal disease: Secondary | ICD-10-CM | POA: Diagnosis not present

## 2017-07-15 DIAGNOSIS — D631 Anemia in chronic kidney disease: Secondary | ICD-10-CM | POA: Diagnosis not present

## 2017-07-15 DIAGNOSIS — N2581 Secondary hyperparathyroidism of renal origin: Secondary | ICD-10-CM | POA: Diagnosis not present

## 2017-07-17 DIAGNOSIS — N2581 Secondary hyperparathyroidism of renal origin: Secondary | ICD-10-CM | POA: Diagnosis not present

## 2017-07-17 DIAGNOSIS — D631 Anemia in chronic kidney disease: Secondary | ICD-10-CM | POA: Diagnosis not present

## 2017-07-17 DIAGNOSIS — N186 End stage renal disease: Secondary | ICD-10-CM | POA: Diagnosis not present

## 2017-07-19 DIAGNOSIS — N186 End stage renal disease: Secondary | ICD-10-CM | POA: Diagnosis not present

## 2017-07-19 DIAGNOSIS — D631 Anemia in chronic kidney disease: Secondary | ICD-10-CM | POA: Diagnosis not present

## 2017-07-19 DIAGNOSIS — N2581 Secondary hyperparathyroidism of renal origin: Secondary | ICD-10-CM | POA: Diagnosis not present

## 2017-07-20 DIAGNOSIS — I120 Hypertensive chronic kidney disease with stage 5 chronic kidney disease or end stage renal disease: Secondary | ICD-10-CM | POA: Diagnosis not present

## 2017-07-20 DIAGNOSIS — Z992 Dependence on renal dialysis: Secondary | ICD-10-CM | POA: Diagnosis not present

## 2017-07-20 DIAGNOSIS — N186 End stage renal disease: Secondary | ICD-10-CM | POA: Diagnosis not present

## 2017-07-22 DIAGNOSIS — E8779 Other fluid overload: Secondary | ICD-10-CM | POA: Diagnosis not present

## 2017-07-22 DIAGNOSIS — E8770 Fluid overload, unspecified: Secondary | ICD-10-CM | POA: Diagnosis not present

## 2017-07-22 DIAGNOSIS — D631 Anemia in chronic kidney disease: Secondary | ICD-10-CM | POA: Diagnosis not present

## 2017-07-22 DIAGNOSIS — N186 End stage renal disease: Secondary | ICD-10-CM | POA: Diagnosis not present

## 2017-07-22 DIAGNOSIS — N2581 Secondary hyperparathyroidism of renal origin: Secondary | ICD-10-CM | POA: Diagnosis not present

## 2017-07-23 ENCOUNTER — Ambulatory Visit: Payer: Self-pay

## 2017-07-24 DIAGNOSIS — E8770 Fluid overload, unspecified: Secondary | ICD-10-CM | POA: Diagnosis not present

## 2017-07-24 DIAGNOSIS — D631 Anemia in chronic kidney disease: Secondary | ICD-10-CM | POA: Diagnosis not present

## 2017-07-24 DIAGNOSIS — E8779 Other fluid overload: Secondary | ICD-10-CM | POA: Diagnosis not present

## 2017-07-24 DIAGNOSIS — N186 End stage renal disease: Secondary | ICD-10-CM | POA: Diagnosis not present

## 2017-07-24 DIAGNOSIS — N2581 Secondary hyperparathyroidism of renal origin: Secondary | ICD-10-CM | POA: Diagnosis not present

## 2017-07-27 DIAGNOSIS — N186 End stage renal disease: Secondary | ICD-10-CM | POA: Diagnosis not present

## 2017-07-27 DIAGNOSIS — N2581 Secondary hyperparathyroidism of renal origin: Secondary | ICD-10-CM | POA: Diagnosis not present

## 2017-07-27 DIAGNOSIS — E8770 Fluid overload, unspecified: Secondary | ICD-10-CM | POA: Diagnosis not present

## 2017-07-27 DIAGNOSIS — D631 Anemia in chronic kidney disease: Secondary | ICD-10-CM | POA: Diagnosis not present

## 2017-07-27 DIAGNOSIS — E8779 Other fluid overload: Secondary | ICD-10-CM | POA: Diagnosis not present

## 2017-07-28 ENCOUNTER — Encounter (INDEPENDENT_AMBULATORY_CARE_PROVIDER_SITE_OTHER): Payer: Self-pay | Admitting: Physician Assistant

## 2017-07-28 ENCOUNTER — Ambulatory Visit (INDEPENDENT_AMBULATORY_CARE_PROVIDER_SITE_OTHER): Payer: Medicare Other | Admitting: Physician Assistant

## 2017-07-28 VITALS — BP 156/80 | HR 82 | Temp 98.0°F | Resp 20 | Ht 69.0 in | Wt 245.0 lb

## 2017-07-28 DIAGNOSIS — I10 Essential (primary) hypertension: Secondary | ICD-10-CM | POA: Diagnosis not present

## 2017-07-28 DIAGNOSIS — N186 End stage renal disease: Secondary | ICD-10-CM | POA: Diagnosis not present

## 2017-07-28 DIAGNOSIS — J069 Acute upper respiratory infection, unspecified: Secondary | ICD-10-CM | POA: Diagnosis not present

## 2017-07-28 DIAGNOSIS — G4700 Insomnia, unspecified: Secondary | ICD-10-CM | POA: Diagnosis not present

## 2017-07-28 DIAGNOSIS — N2581 Secondary hyperparathyroidism of renal origin: Secondary | ICD-10-CM | POA: Diagnosis not present

## 2017-07-28 DIAGNOSIS — E8779 Other fluid overload: Secondary | ICD-10-CM | POA: Diagnosis not present

## 2017-07-28 DIAGNOSIS — M542 Cervicalgia: Secondary | ICD-10-CM | POA: Diagnosis not present

## 2017-07-28 DIAGNOSIS — E8770 Fluid overload, unspecified: Secondary | ICD-10-CM | POA: Diagnosis not present

## 2017-07-28 DIAGNOSIS — D631 Anemia in chronic kidney disease: Secondary | ICD-10-CM | POA: Diagnosis not present

## 2017-07-28 MED ORDER — VIREAD 300 MG PO TABS: ORAL_TABLET | ORAL | 10 refills | Status: DC

## 2017-07-28 MED ORDER — CARVEDILOL 25 MG PO TABS: 25.0000 mg | ORAL_TABLET | Freq: Two times a day (BID) | ORAL | 3 refills | Status: DC

## 2017-07-28 MED ORDER — FUROSEMIDE 40 MG PO TABS: 40.0000 mg | ORAL_TABLET | ORAL | 3 refills | Status: DC | PRN

## 2017-07-28 MED ORDER — AMLODIPINE BESYLATE 10 MG PO TABS: 10.0000 mg | ORAL_TABLET | Freq: Every day | ORAL | 3 refills | Status: DC

## 2017-07-28 MED ORDER — ISOSORBIDE DINITRATE 30 MG PO TABS: 30.0000 mg | ORAL_TABLET | Freq: Three times a day (TID) | ORAL | 3 refills | Status: DC

## 2017-07-28 MED ORDER — DM-APAP-CPM 15-500-2 MG PO TABS: 2.0000 | ORAL_TABLET | Freq: Four times a day (QID) | ORAL | 0 refills | Status: AC

## 2017-07-28 MED ORDER — LISINOPRIL 10 MG PO TABS: ORAL_TABLET | ORAL | 3 refills | Status: DC

## 2017-07-28 MED ORDER — DOXAZOSIN MESYLATE 8 MG PO TABS: 8.0000 mg | ORAL_TABLET | Freq: Every day | ORAL | 3 refills | Status: DC

## 2017-07-28 MED ORDER — CYCLOBENZAPRINE HCL 10 MG PO TABS: 10.0000 mg | ORAL_TABLET | Freq: Every day | ORAL | 0 refills | Status: DC

## 2017-07-28 MED ORDER — HYDRALAZINE HCL 100 MG PO TABS: 50.0000 mg | ORAL_TABLET | Freq: Three times a day (TID) | ORAL | 3 refills | Status: DC

## 2017-07-28 MED ORDER — AMOXICILLIN-POT CLAVULANATE 875-125 MG PO TABS: 1.0000 | ORAL_TABLET | Freq: Two times a day (BID) | ORAL | 0 refills | Status: DC

## 2017-07-28 NOTE — Patient Instructions (Signed)

## 2017-07-28 NOTE — Progress Notes (Signed)
Subjective:  Patient ID: Darrell Johnson, male    DOB: 03/26/1966  Age: 52 y.o. MRN: [id number]  CC: cough  HPI  Darrell Johnson is a 52 y.o. male with a medical history of anemia, anxiety, asthma, CHF, ESRD, Gout, Syphillis, HIV, HTN, HLD, PNA, rib fractures, seizures, tobacco abuse, and chronic sphenoidal sinusitis presents with cough, sneezing, nasal congestion, and left sided neck ache. Thinks there may be some back ache also. Saw his dialysis doctor for the same and was prescribed an antibiotic one month ago which relieved his symptoms. No close contacts with the same. Has not taken anything recently for relief. He request medication to help him sleep. Difficulty maintaining sleep due to his present illness/symptoms. Does not endorse HA, AMS, nuchal rigidity, fever, chills, nausea, vomiting, neurological deficits, rash,CP, palpitations, SOB aside from coughing, abdominal pain, or GI/GU sxs. Nephrologist reportedly told pt that he will need a review of BP and refills of his blood pressure medications.      Outpatient Medications Prior to Visit  Medication Sig Dispense Refill  . acetaminophen (TYLENOL) 500 MG tablet Take 1,000 mg by mouth every 6 (six) hours as needed for mild pain.    Marland Kitchen amLODipine (NORVASC) 10 MG tablet Take 10 mg by mouth daily.    Marland Kitchen buPROPion (WELLBUTRIN) 75 MG tablet TAKE 1/2 TABLET TWICE A DAY (Patient taking differently: TAKE 37.5mg  by mouth TWICE A DAY) 60 tablet 1  . carvedilol (COREG) 25 MG tablet Take 1 tablet (25 mg total) by mouth 2 (two) times daily with a meal. 60 tablet 3  . doxazosin (CARDURA) 8 MG tablet Take 8 mg by mouth at bedtime.    Marland Kitchen EDURANT 25 MG TABS tablet TAKE 1 TABLET (25 MG TOTAL) BY MOUTH DAILY WITH BREAKFAST. 30 tablet 10  . ferric citrate (AURYXIA) 1 GM 210 MG(Fe) tablet Take 420 mg by mouth 2 (two) times daily with a meal.     . furosemide (LASIX) 40 MG tablet TAKE 1 TABLET (40 MG TOTAL) BY MOUTH AS NEEDED FOR FLUID OR EDEMA. 30 tablet 2   . hydrALAZINE (APRESOLINE) 100 MG tablet TAKE 0.5 TABLETS (50 MG TOTAL) BY MOUTH 3 (THREE) TIMES DAILY. 90 tablet 0  . isosorbide dinitrate (ISORDIL) 20 MG tablet Take 1 tablet (20 mg total) by mouth 3 (three) times daily. 90 tablet 5  . lisinopril (PRINIVIL,ZESTRIL) 10 MG tablet TAKE 1 TABLET BY MOUTH EVERYDAY AT BEDTIME (Patient taking differently: TAKE 10mg  BY MOUTH EVERYDAY AT BEDTIME) 30 tablet 0  . montelukast (SINGULAIR) 10 MG tablet Take 1 tablet (10 mg total) by mouth at bedtime. 30 tablet 2  . pravastatin (PRAVACHOL) 40 MG tablet TAKE 1 TABLET (40 MG TOTAL) BY MOUTH EVERY EVENING. 90 tablet 3  . TIVICAY 50 MG tablet TAKE 1 TABLET (50 MG TOTAL) BY MOUTH DAILY. 30 tablet 10  . VIREAD 300 MG tablet TAKE 1 TABLET BY MOUTH ONCE A WEEK (Patient taking differently: TAKE 1 TABLET BY MOUTH ONCE A WEEK ON SUNDAYS) 4 tablet 10  . albuterol (PROVENTIL HFA;VENTOLIN HFA) 108 (90 Base) MCG/ACT inhaler Inhale 2 puffs into the lungs every 4 (four) hours as needed for wheezing or shortness of breath. 1 Inhaler 5   No facility-administered medications prior to visit.      ROS Review of Systems  Constitutional: Negative for chills, fever and malaise/fatigue.  Eyes: Negative for blurred vision.  Respiratory: Negative for shortness of breath.   Cardiovascular: Negative for chest pain and palpitations.  Gastrointestinal: Negative for abdominal pain and nausea.  Genitourinary: Negative for dysuria and hematuria.  Musculoskeletal: Negative for joint pain and myalgias.  Skin: Negative for rash.  Neurological: Negative for tingling and headaches.  Psychiatric/Behavioral: Negative for depression. The patient is not nervous/anxious.     Objective:  BP (!) 156/80 (BP Location: Left Arm, Patient Position: Sitting, Cuff Size: Large)   Pulse 82   Temp 98 F (36.7 C) (Oral)   Resp 20   Ht 5\' 9"  (1.753 m)   Wt 245 lb (111.1 kg)   SpO2 97%   BMI 36.18 kg/m   BP/Weight 07/28/2017 07/10/2017 69/62/9528   Systolic BP 413 244 010  Diastolic BP 80 49 80  Wt. (Lbs) 245 - 246.2  BMI 36.18 - 31.61      Physical Exam  Constitutional: He is oriented to person, place, and time.  Well developed, well nourished, NAD, polite  HENT:  Head: Normocephalic and atraumatic.  TMs erythematous with mild bulging. Mild maxillary sinus tenderness to palpation. Turbinates moderately hypertrophic. Oropharynx erythematous without exudative lesions.  Eyes: No scleral icterus.  Neck: Normal range of motion. Neck supple. No thyromegaly present.  Cardiovascular: Normal rate, regular rhythm and normal heart sounds.  Pulmonary/Chest: Effort normal and breath sounds normal.  Musculoskeletal: He exhibits no edema.  Lymphadenopathy:    He has cervical adenopathy (mild submandibular lymphadenopathy).  Neurological: He is alert and oriented to person, place, and time. No cranial nerve deficit. Coordination normal.  Skin: Skin is warm and dry. No rash noted. No erythema. No pallor.  Psychiatric: He has a normal mood and affect. His behavior is normal. Thought content normal.  Vitals reviewed.    Assessment & Plan:   1. Acute upper respiratory infection - amoxicillin-clavulanate (AUGMENTIN) 875-125 MG tablet; Take 1 tablet by mouth 2 (two) times daily.  Dispense: 20 tablet; Refill: 0 - DM-APAP-CPM (CORICIDIN HBP FLU) 15-500-2 MG TABS; Take 2 tablets by mouth every 6 (six) hours for 5 days.  Dispense: 1 each; Refill: 0 -Pt advised to call here or go to urgent/emergency care if symptoms persist or worsen.   2. Insomnia, unspecified type - cyclobenzaprine (FLEXERIL) 10 MG tablet; Take 1 tablet (10 mg total) by mouth at bedtime.  Dispense: 30 tablet; Refill: 0  3. Cervicalgia - cyclobenzaprine (FLEXERIL) 10 MG tablet; Take 1 tablet (10 mg total) by mouth at bedtime.  Dispense: 30 tablet; Refill: 0  4. Hypertension, unspecified type - isosorbide dinitrate (ISORDIL) 30 MG tablet; Take 1 tablet (30 mg total) by mouth  3 (three) times daily.  Dispense: 270 tablet; Refill: 3 - lisinopril (PRINIVIL,ZESTRIL) 10 MG tablet; TAKE 1 TABLET BY MOUTH EVERYDAY AT BEDTIME  Dispense: 90 tablet; Refill: 3 - hydrALAZINE (APRESOLINE) 100 MG tablet; Take 0.5 tablets (50 mg total) by mouth 3 (three) times daily.  Dispense: 270 tablet; Refill: 3 - furosemide (LASIX) 40 MG tablet; Take 1 tablet (40 mg total) by mouth as needed for fluid or edema.  Dispense: 90 tablet; Refill: 3 - doxazosin (CARDURA) 8 MG tablet; Take 1 tablet (8 mg total) by mouth at bedtime.  Dispense: 90 tablet; Refill: 3 - carvedilol (COREG) 25 MG tablet; Take 1 tablet (25 mg total) by mouth 2 (two) times daily with a meal.  Dispense: 180 tablet; Refill: 3 - amLODipine (NORVASC) 10 MG tablet; Take 1 tablet (10 mg total) by mouth daily.  Dispense: 90 tablet; Refill: 3   Meds ordered this encounter  Medications  . amoxicillin-clavulanate (AUGMENTIN) 875-125 MG tablet  Sig: Take 1 tablet by mouth 2 (two) times daily.    Dispense:  20 tablet    Refill:  0    Order Specific Question:   Supervising Provider    Answer:   Tresa Garter [id number]  . DM-APAP-CPM (CORICIDIN HBP FLU) 15-500-2 MG TABS    Sig: Take 2 tablets by mouth every 6 (six) hours for 5 days.    Dispense:  1 each    Refill:  0    Order Specific Question:   Supervising Provider    Answer:   Tresa Garter [id number]  . cyclobenzaprine (FLEXERIL) 10 MG tablet    Sig: Take 1 tablet (10 mg total) by mouth at bedtime.    Dispense:  30 tablet    Refill:  0    Order Specific Question:   Supervising Provider    Answer:   Tresa Garter [id number]  . VIREAD 300 MG tablet    Sig: TAKE 1 TABLET BY MOUTH ONCE A WEEK ON SUNDAYS    Dispense:  4 tablet    Refill:  10    Order Specific Question:   Supervising Provider    Answer:   Tresa Garter [id number]  . isosorbide dinitrate (ISORDIL) 30 MG tablet    Sig: Take 1 tablet (30 mg total) by mouth 3 (three) times daily.     Dispense:  270 tablet    Refill:  3    Order Specific Question:   Supervising Provider    Answer:   Tresa Garter [id number]  . lisinopril (PRINIVIL,ZESTRIL) 10 MG tablet    Sig: TAKE 1 TABLET BY MOUTH EVERYDAY AT BEDTIME    Dispense:  90 tablet    Refill:  3    Order Specific Question:   Supervising Provider    Answer:   Tresa Garter [id number]  . hydrALAZINE (APRESOLINE) 100 MG tablet    Sig: Take 0.5 tablets (50 mg total) by mouth 3 (three) times daily.    Dispense:  270 tablet    Refill:  3    Order Specific Question:   Supervising Provider    Answer:   Tresa Garter [id number]  . furosemide (LASIX) 40 MG tablet    Sig: Take 1 tablet (40 mg total) by mouth as needed for fluid or edema.    Dispense:  90 tablet    Refill:  3    Order Specific Question:   Supervising Provider    Answer:   Tresa Garter [id number]  . doxazosin (CARDURA) 8 MG tablet    Sig: Take 1 tablet (8 mg total) by mouth at bedtime.    Dispense:  90 tablet    Refill:  3    Order Specific Question:   Supervising Provider    Answer:   Tresa Garter [id number]  . carvedilol (COREG) 25 MG tablet    Sig: Take 1 tablet (25 mg total) by mouth 2 (two) times daily with a meal.    Dispense:  180 tablet    Refill:  3    Order Specific Question:   Supervising Provider    Answer:   Tresa Garter [id number]  . amLODipine (NORVASC) 10 MG tablet    Sig: Take 1 tablet (10 mg total) by mouth daily.    Dispense:  90 tablet    Refill:  3    Order Specific Question:   Supervising Provider    Answer:   Tresa Garter [id number]  Follow-up: Return in about 4 weeks (around 08/25/2017) for HTN.   Clent Demark PA

## 2017-07-29 ENCOUNTER — Other Ambulatory Visit: Payer: Self-pay

## 2017-07-29 ENCOUNTER — Ambulatory Visit: Payer: Self-pay

## 2017-07-29 DIAGNOSIS — E8779 Other fluid overload: Secondary | ICD-10-CM | POA: Diagnosis not present

## 2017-07-29 DIAGNOSIS — N186 End stage renal disease: Secondary | ICD-10-CM | POA: Diagnosis not present

## 2017-07-29 DIAGNOSIS — N2581 Secondary hyperparathyroidism of renal origin: Secondary | ICD-10-CM | POA: Diagnosis not present

## 2017-07-29 DIAGNOSIS — E8770 Fluid overload, unspecified: Secondary | ICD-10-CM | POA: Diagnosis not present

## 2017-07-29 DIAGNOSIS — D631 Anemia in chronic kidney disease: Secondary | ICD-10-CM | POA: Diagnosis not present

## 2017-07-29 NOTE — Patient Outreach (Signed)
[facility]) Care Management  07/29/2017  RAYDIN BIELINSKI Jan 19, 1966 [id number]   RNCM called regarding home visit. Client reports he was asleep. He had not been feeling well and saw primary care provider on yesterday.  Plan: home visit rescheduled.  [name], RN, MSN, [place] Coordinator Cell: [phone]

## 2017-07-31 DIAGNOSIS — E8770 Fluid overload, unspecified: Secondary | ICD-10-CM | POA: Diagnosis not present

## 2017-07-31 DIAGNOSIS — N2581 Secondary hyperparathyroidism of renal origin: Secondary | ICD-10-CM | POA: Diagnosis not present

## 2017-07-31 DIAGNOSIS — N186 End stage renal disease: Secondary | ICD-10-CM | POA: Diagnosis not present

## 2017-07-31 DIAGNOSIS — D631 Anemia in chronic kidney disease: Secondary | ICD-10-CM | POA: Diagnosis not present

## 2017-07-31 DIAGNOSIS — E8779 Other fluid overload: Secondary | ICD-10-CM | POA: Diagnosis not present

## 2017-08-01 ENCOUNTER — Other Ambulatory Visit: Payer: Self-pay | Admitting: Internal Medicine

## 2017-08-01 DIAGNOSIS — J302 Other seasonal allergic rhinitis: Secondary | ICD-10-CM

## 2017-08-03 DIAGNOSIS — E8779 Other fluid overload: Secondary | ICD-10-CM | POA: Diagnosis not present

## 2017-08-03 DIAGNOSIS — N2581 Secondary hyperparathyroidism of renal origin: Secondary | ICD-10-CM | POA: Diagnosis not present

## 2017-08-03 DIAGNOSIS — N186 End stage renal disease: Secondary | ICD-10-CM | POA: Diagnosis not present

## 2017-08-03 DIAGNOSIS — E8770 Fluid overload, unspecified: Secondary | ICD-10-CM | POA: Diagnosis not present

## 2017-08-03 DIAGNOSIS — D631 Anemia in chronic kidney disease: Secondary | ICD-10-CM | POA: Diagnosis not present

## 2017-08-04 DIAGNOSIS — E8779 Other fluid overload: Secondary | ICD-10-CM | POA: Diagnosis not present

## 2017-08-04 DIAGNOSIS — N186 End stage renal disease: Secondary | ICD-10-CM | POA: Diagnosis not present

## 2017-08-04 DIAGNOSIS — N2581 Secondary hyperparathyroidism of renal origin: Secondary | ICD-10-CM | POA: Diagnosis not present

## 2017-08-04 DIAGNOSIS — D631 Anemia in chronic kidney disease: Secondary | ICD-10-CM | POA: Diagnosis not present

## 2017-08-04 DIAGNOSIS — E8770 Fluid overload, unspecified: Secondary | ICD-10-CM | POA: Diagnosis not present

## 2017-08-05 DIAGNOSIS — D631 Anemia in chronic kidney disease: Secondary | ICD-10-CM | POA: Diagnosis not present

## 2017-08-05 DIAGNOSIS — E8779 Other fluid overload: Secondary | ICD-10-CM | POA: Diagnosis not present

## 2017-08-05 DIAGNOSIS — E8770 Fluid overload, unspecified: Secondary | ICD-10-CM | POA: Diagnosis not present

## 2017-08-05 DIAGNOSIS — N186 End stage renal disease: Secondary | ICD-10-CM | POA: Diagnosis not present

## 2017-08-05 DIAGNOSIS — N2581 Secondary hyperparathyroidism of renal origin: Secondary | ICD-10-CM | POA: Diagnosis not present

## 2017-08-06 ENCOUNTER — Ambulatory Visit: Payer: Self-pay

## 2017-08-06 ENCOUNTER — Other Ambulatory Visit: Payer: Self-pay

## 2017-08-06 NOTE — Patient Outreach (Signed)
[facility]) Care Management  08/06/2017  Bashir Marchetti [place] 10-30-1965 [id number]  Home visit scheduled. RNCM called prior to leaving to confirm home visit. No answer. RNCM called upon arrival to client's home. Client answered the phone and stated he thought the appointment was for yesterday and this is not a good time.Marland Kitchen RNCM explained again that the assessment could be done telephonically. Client in agreement for telephonic call tomorrow.  Plan: follow up tomorrow.  [name], RN, MSN, [place] Coordinator Cell: [phone]

## 2017-08-07 ENCOUNTER — Other Ambulatory Visit: Payer: Self-pay

## 2017-08-07 ENCOUNTER — Encounter: Payer: Self-pay | Admitting: *Deleted

## 2017-08-07 DIAGNOSIS — E8770 Fluid overload, unspecified: Secondary | ICD-10-CM | POA: Diagnosis not present

## 2017-08-07 DIAGNOSIS — N186 End stage renal disease: Secondary | ICD-10-CM | POA: Diagnosis not present

## 2017-08-07 DIAGNOSIS — N2581 Secondary hyperparathyroidism of renal origin: Secondary | ICD-10-CM | POA: Diagnosis not present

## 2017-08-07 DIAGNOSIS — E8779 Other fluid overload: Secondary | ICD-10-CM | POA: Diagnosis not present

## 2017-08-07 DIAGNOSIS — D631 Anemia in chronic kidney disease: Secondary | ICD-10-CM | POA: Diagnosis not present

## 2017-08-07 NOTE — Patient Outreach (Addendum)
[facility]) Care Management  [place]  08/07/2017   Darrell Johnson May 30, 1966 [id number]  Subjective: "My main problem is a sore throat and my back. He gave me a muscle relaxant, but I don't feel like it is helping."  Objective: None-telephonic assessment.  Encounter Medications:  Outpatient Encounter Medications as of 08/07/2017  Medication Sig  . acetaminophen (TYLENOL) 500 MG tablet Take 1,000 mg by mouth every 6 (six) hours as needed for mild pain.  Marland Kitchen amLODipine (NORVASC) 10 MG tablet Take 1 tablet (10 mg total) by mouth daily.  Marland Kitchen buPROPion (WELLBUTRIN) 75 MG tablet TAKE 1/2 TABLET TWICE A DAY (Patient taking differently: TAKE 37.5mg  by mouth TWICE A DAY)  . carvedilol (COREG) 25 MG tablet Take 1 tablet (25 mg total) by mouth 2 (two) times daily with a meal.  . cyclobenzaprine (FLEXERIL) 10 MG tablet Take 1 tablet (10 mg total) by mouth at bedtime.  Marland Kitchen doxazosin (CARDURA) 8 MG tablet Take 1 tablet (8 mg total) by mouth at bedtime.  Marland Kitchen EDURANT 25 MG TABS tablet TAKE 1 TABLET (25 MG TOTAL) BY MOUTH DAILY WITH BREAKFAST.  . ferric citrate (AURYXIA) 1 GM 210 MG(Fe) tablet Take 420 mg by mouth 2 (two) times daily with a meal.   . furosemide (LASIX) 40 MG tablet Take 1 tablet (40 mg total) by mouth as needed for fluid or edema.  . hydrALAZINE (APRESOLINE) 100 MG tablet Take 0.5 tablets (50 mg total) by mouth 3 (three) times daily.  . isosorbide dinitrate (ISORDIL) 30 MG tablet Take 1 tablet (30 mg total) by mouth 3 (three) times daily.  Marland Kitchen lisinopril (PRINIVIL,ZESTRIL) 10 MG tablet TAKE 1 TABLET BY MOUTH EVERYDAY AT BEDTIME  . montelukast (SINGULAIR) 10 MG tablet Take 1 tablet (10 mg total) by mouth at bedtime.  . pravastatin (PRAVACHOL) 40 MG tablet TAKE 1 TABLET (40 MG TOTAL) BY MOUTH EVERY EVENING.  Marland Kitchen TIVICAY 50 MG tablet TAKE 1 TABLET (50 MG TOTAL) BY MOUTH DAILY.  Marland Kitchen VIREAD 300 MG tablet TAKE 1 TABLET BY MOUTH ONCE A WEEK ON SUNDAYS  . albuterol (PROVENTIL  HFA;VENTOLIN HFA) 108 (90 Base) MCG/ACT inhaler Inhale 2 puffs into the lungs every 4 (four) hours as needed for wheezing or shortness of breath.  Marland Kitchen amoxicillin-clavulanate (AUGMENTIN) 875-125 MG tablet Take 1 tablet by mouth 2 (two) times daily. (Patient not taking: Reported on 08/07/2017)   No facility-administered encounter medications on file as of 08/07/2017.     Functional Status:  In your present state of health, do you have any difficulty performing the following activities: 08/07/2017 04/13/2017  Hearing? N N  Vision? N N  Difficulty concentrating or making decisions? N N  Walking or climbing stairs? N N  Dressing or bathing? N N  Doing errands, shopping? N N  Preparing Food and eating ? N -  Using the Toilet? N -  In the past six months, have you accidently leaked urine? N -  Do you have problems with loss of bowel control? N -  Managing your Medications? N -  Managing your Finances? N -  Housekeeping or managing your Housekeeping? N -  Some recent data might be hidden    Fall/Depression Screening: Fall Risk  08/07/2017 04/06/2017 04/02/2017  Falls in the past year? No No No  Risk for fall due to : - - -   PHQ 2/9 Scores 08/07/2017 04/06/2017 04/02/2017 03/19/2017 03/05/2017 10/09/2016 10/01/2016  PHQ - 2 Score 0 0 0 0 0 0  0  PHQ- 9 Score - - - - - - -    Assessment: 52 year old with history of chronic pain disorder, flash pulmonary edema, HTN, ESRD/HD, Heart failure, Human Immuno- deficiency virus.  RNCM called to complete assessment: Client reports his main problems today are a sore throat and back pain. Client was treated last week for upper respiratory infection. RNCM encouraged client to follow up with provider regarding sore throat.  Client reports history of back pain-shoulder/back. He reports he was started on muscle relaxants last week, but reports he does not feel relief adding he takes this at night and will get up during the night and feel pain when he is up. He adds it  does not help until the next day. He has tylenol on his profile, but states that he has not taken the tylenol because he does not like to mix medications. RNCM discussed use of warm heat, massage and stretching. RNCM also encouraged client to ask provider if physical therapy is an option.    Social work referral regarding glasses. He reports he has a prescription, but is unable to afford his glasses.  Client reports he has a nebulizer machine that is old. He states he received the machine from someone else. RNCM instructed him on the process for obtaining a new nebulizer machine. Encouraged client to ask primary care for a prescription and take to a [place], several companies in [place] discussed.  Discussed upcoming appointments.  Confirmed client has RNCM's contact number.  Plan: social work referral, attend next primary care office visit. [facility] CM Care Plan Problem One     Most Recent Value  Care Plan Problem One  post emergency room visit for orthostatic hypotension post dialysis.  Role Documenting the Problem One  Care Management [place] for Problem One  Active  THN CM Short Term Goal #1   client will have a blood pressure cuff for home monitoring within the next 30 days.  THN CM Short Term Goal #1 Start Date  07/13/17    [facility] CM Care Plan Problem Two     Most Recent Value  Care Plan Problem Two  assistance with communication with provider.  Role Documenting the Problem Two  Care Management Coordinator  Care Plan for Problem Two  Active  THN CM Short Term Goal #1   Client will verbalize communication with provider/follow up regarding regarding pain  THN CM Short Term Goal #1 Start Date  08/07/17  Interventions for Short Term Goal #2    RNCM encouraged client to follow up with provider if pain is not relieved. RNCM discussed use of warm heat, massage and stretching. RNCM also encouraged client to ask provider if physical therapy is an option. RNCM discussed importance  of taking medications as prescribed.      [name], RN, MSN, [place] Coordinator Cell: [phone]

## 2017-08-08 ENCOUNTER — Other Ambulatory Visit: Payer: Self-pay | Admitting: Student in an Organized Health Care Education/Training Program

## 2017-08-08 DIAGNOSIS — F411 Generalized anxiety disorder: Secondary | ICD-10-CM

## 2017-08-10 DIAGNOSIS — N186 End stage renal disease: Secondary | ICD-10-CM | POA: Diagnosis not present

## 2017-08-10 DIAGNOSIS — E8770 Fluid overload, unspecified: Secondary | ICD-10-CM | POA: Diagnosis not present

## 2017-08-10 DIAGNOSIS — D631 Anemia in chronic kidney disease: Secondary | ICD-10-CM | POA: Diagnosis not present

## 2017-08-10 DIAGNOSIS — N2581 Secondary hyperparathyroidism of renal origin: Secondary | ICD-10-CM | POA: Diagnosis not present

## 2017-08-10 DIAGNOSIS — E8779 Other fluid overload: Secondary | ICD-10-CM | POA: Diagnosis not present

## 2017-08-11 ENCOUNTER — Other Ambulatory Visit: Payer: Self-pay | Admitting: *Deleted

## 2017-08-11 ENCOUNTER — Encounter: Payer: Self-pay | Admitting: *Deleted

## 2017-08-11 NOTE — Patient Outreach (Signed)
[facility]) Care Johnson  08/11/2017  Darrell Johnson 1966/05/12 [id number]   CSW was able to make initial contact with patient today to perform phone assessment, as well as assess and assist with social work needs and services.  CSW introduced self, explained role and types of services provided through [place] [facility] ([place] [facility]).  CSW further explained to patient that CSW works with patient's RNCM, also with Darrell Johnson, Darrell Johnson. CSW then explained the reason for the call, indicating that Ms. Darrell Johnson thought that patient would benefit from social work services and resources to assist with getting his eye glass prescription filled.  CSW obtained two HIPAA compliant identifiers from patient, which included patient's name and date of birth. Patient admits that he is unable to get his eye glass prescription filled, due to lack of financial resources.  CSW provided patient with the following resources, in hopes that one of the agencies will be able to assist patient with obtaining prescription eye glasses: Darrell Johnson of [employer] [facility] Patient took down the contact information and agreed to begin calling these resources today.  CSW was able to ensure that patient has the correct contact information for CSW, encouraging patient to contact CSW directly if none of these resources are able to assist him, or if additional social work needs arise in the near future.  Patient voiced understanding and was agreeable to this plan. CSW will perform a case closure on patient, as all goals of treatment have been met from social work standpoint and no additional social work needs have been identified at this time.  CSW will notify patient's RNCM with [place] [facility], Darrell Johnson of CSW's plans to close patient's case.  CSW will fax an update to patient's Primary Care  Physician, Dr. Domenica Johnson to ensure that they are aware of CSW's involvement with patient's plan of care.  CSW will submit a case closure request to Darrell Johnson, Care Johnson Assistant with [place] [facility], in the form of an [employer].  CSW will ensure that Darrell Johnson is aware of Darrell Johnson's, RNCM with [place] [facility], continued involvement with patient's care. Darrell Johnson, [name], [name], [name]  Licensed Education officer, [facility]  Mailing [place] [address], [place], [place] [address] Physical Address-[address], [name], [place] [address] Toll Free Main # [phone] Fax # [phone] Cell # [phone]  Office # [phone] [email or web address]

## 2017-08-12 DIAGNOSIS — N186 End stage renal disease: Secondary | ICD-10-CM | POA: Diagnosis not present

## 2017-08-12 DIAGNOSIS — D631 Anemia in chronic kidney disease: Secondary | ICD-10-CM | POA: Diagnosis not present

## 2017-08-12 DIAGNOSIS — N2581 Secondary hyperparathyroidism of renal origin: Secondary | ICD-10-CM | POA: Diagnosis not present

## 2017-08-12 DIAGNOSIS — E8770 Fluid overload, unspecified: Secondary | ICD-10-CM | POA: Diagnosis not present

## 2017-08-12 DIAGNOSIS — E8779 Other fluid overload: Secondary | ICD-10-CM | POA: Diagnosis not present

## 2017-08-14 DIAGNOSIS — E8779 Other fluid overload: Secondary | ICD-10-CM | POA: Diagnosis not present

## 2017-08-14 DIAGNOSIS — E8770 Fluid overload, unspecified: Secondary | ICD-10-CM | POA: Diagnosis not present

## 2017-08-14 DIAGNOSIS — N2581 Secondary hyperparathyroidism of renal origin: Secondary | ICD-10-CM | POA: Diagnosis not present

## 2017-08-14 DIAGNOSIS — D631 Anemia in chronic kidney disease: Secondary | ICD-10-CM | POA: Diagnosis not present

## 2017-08-14 DIAGNOSIS — N186 End stage renal disease: Secondary | ICD-10-CM | POA: Diagnosis not present

## 2017-08-16 ENCOUNTER — Encounter (HOSPITAL_COMMUNITY): Payer: Self-pay | Admitting: Emergency Medicine

## 2017-08-16 ENCOUNTER — Emergency Department (HOSPITAL_COMMUNITY): Payer: Medicare Other

## 2017-08-16 ENCOUNTER — Emergency Department (HOSPITAL_COMMUNITY)
Admission: EM | Admit: 2017-08-16 | Discharge: 2017-08-16 | Disposition: A | Discharge status: Home / Self Care | Payer: Medicare Other | Attending: Emergency Medicine | Admitting: Emergency Medicine

## 2017-08-16 DIAGNOSIS — N186 End stage renal disease: Secondary | ICD-10-CM | POA: Insufficient documentation

## 2017-08-16 DIAGNOSIS — J181 Lobar pneumonia, unspecified organism: Secondary | ICD-10-CM | POA: Diagnosis not present

## 2017-08-16 DIAGNOSIS — F17210 Nicotine dependence, cigarettes, uncomplicated: Secondary | ICD-10-CM | POA: Insufficient documentation

## 2017-08-16 DIAGNOSIS — R05 Cough: Secondary | ICD-10-CM | POA: Insufficient documentation

## 2017-08-16 DIAGNOSIS — R0981 Nasal congestion: Secondary | ICD-10-CM | POA: Diagnosis not present

## 2017-08-16 DIAGNOSIS — B20 Human immunodeficiency virus [HIV] disease: Secondary | ICD-10-CM | POA: Diagnosis not present

## 2017-08-16 DIAGNOSIS — I132 Hypertensive heart and chronic kidney disease with heart failure and with stage 5 chronic kidney disease, or end stage renal disease: Secondary | ICD-10-CM | POA: Diagnosis not present

## 2017-08-16 DIAGNOSIS — Z79899 Other long term (current) drug therapy: Secondary | ICD-10-CM | POA: Insufficient documentation

## 2017-08-16 DIAGNOSIS — I509 Heart failure, unspecified: Secondary | ICD-10-CM | POA: Diagnosis not present

## 2017-08-16 DIAGNOSIS — R069 Unspecified abnormalities of breathing: Secondary | ICD-10-CM | POA: Diagnosis not present

## 2017-08-16 DIAGNOSIS — Z7902 Long term (current) use of antithrombotics/antiplatelets: Secondary | ICD-10-CM | POA: Diagnosis not present

## 2017-08-16 DIAGNOSIS — Z992 Dependence on renal dialysis: Secondary | ICD-10-CM | POA: Diagnosis not present

## 2017-08-16 DIAGNOSIS — J189 Pneumonia, unspecified organism: Secondary | ICD-10-CM

## 2017-08-16 DIAGNOSIS — R0602 Shortness of breath: Secondary | ICD-10-CM | POA: Diagnosis not present

## 2017-08-16 DIAGNOSIS — R079 Chest pain, unspecified: Secondary | ICD-10-CM | POA: Diagnosis not present

## 2017-08-16 LAB — CBC WITH DIFFERENTIAL/PLATELET
BASOS PCT: 0 %
Basophils Absolute: 0 10*3/uL (ref 0.0–0.1)
EOS PCT: 3 %
Eosinophils Absolute: 0.1 10*3/uL (ref 0.0–0.7)
HEMATOCRIT: 46.1 % (ref 39.0–52.0)
HEMOGLOBIN: 15.2 g/dL (ref 13.0–17.0)
LYMPHS PCT: 19 %
Lymphs Abs: 0.7 10*3/uL (ref 0.7–4.0)
MCH: 30.7 pg (ref 26.0–34.0)
MCHC: 33 g/dL (ref 30.0–36.0)
MCV: 93.1 fL (ref 78.0–100.0)
MONOS PCT: 5 %
Monocytes Absolute: 0.2 10*3/uL (ref 0.1–1.0)
Neutro Abs: 2.6 10*3/uL (ref 1.7–7.7)
Neutrophils Relative %: 73 %
Platelets: 102 10*3/uL — ABNORMAL LOW (ref 150–400)
RBC: 4.95 MIL/uL (ref 4.22–5.81)
RDW: 21.3 % — ABNORMAL HIGH (ref 11.5–15.5)
WBC: 3.6 10*3/uL — ABNORMAL LOW (ref 4.0–10.5)

## 2017-08-16 LAB — COMPREHENSIVE METABOLIC PANEL
ALBUMIN: 3.7 g/dL (ref 3.5–5.0)
ALK PHOS: 70 U/L (ref 38–126)
ALT: 10 U/L — ABNORMAL LOW (ref 17–63)
ANION GAP: 16 — AB (ref 5–15)
AST: 18 U/L (ref 15–41)
BUN: 45 mg/dL — ABNORMAL HIGH (ref 6–20)
CHLORIDE: 98 mmol/L — AB (ref 101–111)
CO2: 25 mmol/L (ref 22–32)
Calcium: 11.9 mg/dL — ABNORMAL HIGH (ref 8.9–10.3)
Creatinine, Ser: 11.7 mg/dL — ABNORMAL HIGH (ref 0.61–1.24)
GFR calc Af Amer: 5 mL/min — ABNORMAL LOW (ref >60)
GFR calc non Af Amer: 4 mL/min — ABNORMAL LOW (ref >60)
GLUCOSE: 79 mg/dL (ref 65–99)
POTASSIUM: 5 mmol/L (ref 3.5–5.1)
SODIUM: 139 mmol/L (ref 135–145)
Total Bilirubin: 1.1 mg/dL (ref 0.3–1.2)
Total Protein: 6.8 g/dL (ref 6.5–8.1)

## 2017-08-16 LAB — I-STAT CHEM 8, ED
BUN: 41 mg/dL — ABNORMAL HIGH (ref 6–20)
CHLORIDE: 100 mmol/L — AB (ref 101–111)
CREATININE: 11.8 mg/dL — AB (ref 0.61–1.24)
Calcium, Ion: 1.3 mmol/L (ref 1.15–1.40)
GLUCOSE: 77 mg/dL (ref 65–99)
HEMATOCRIT: 42 % (ref 39.0–52.0)
Hemoglobin: 14.3 g/dL (ref 13.0–17.0)
POTASSIUM: 4.8 mmol/L (ref 3.5–5.1)
Sodium: 137 mmol/L (ref 135–145)
TCO2: 26 mmol/L (ref 22–32)

## 2017-08-16 LAB — INFLUENZA PANEL BY PCR (TYPE A & B)
Influenza A By PCR: NEGATIVE
Influenza B By PCR: NEGATIVE

## 2017-08-16 LAB — I-STAT TROPONIN, ED: Troponin i, poc: 0.03 ng/mL (ref 0.00–0.08)

## 2017-08-16 MED ORDER — ACETAMINOPHEN 325 MG PO TABS
650.0000 mg | ORAL_TABLET | Freq: Once | ORAL | Status: AC
  Administered 2017-08-16: 650 mg via ORAL
  Filled 2017-08-16: qty 2

## 2017-08-16 MED ORDER — LEVOFLOXACIN 500 MG PO TABS: 500.0000 mg | ORAL_TABLET | ORAL | 0 refills | Status: DC

## 2017-08-16 MED ORDER — LEVOFLOXACIN 750 MG PO TABS
750.0000 mg | ORAL_TABLET | Freq: Once | ORAL | Status: AC
  Administered 2017-08-16: 750 mg via ORAL
  Filled 2017-08-16: qty 1

## 2017-08-16 NOTE — Discharge Instructions (Signed)
There is evidence of pneumonia on the chest x-ray.  Recommend repeat chest x-ray in several weeks following antibiotic course. Please take all of your antibiotics until finished!   You may develop abdominal discomfort or diarrhea from the antibiotic.  You may help offset this with probiotics which you can buy or get in yogurt. Do not eat or take the probiotics until 2 hours after your antibiotic.  Take dose of antibiotic every 48 hours for 4 total home doses (1/29, 1/31, 2/2, 2/4). Follow-up with your primary care provider.  Return to the ED for any worsening symptoms.

## 2017-08-16 NOTE — ED Provider Notes (Signed)
[facility] EMERGENCY DEPARTMENT Provider Note   CSN: [id number] Arrival date & time: 08/16/17  1719     History   Chief Complaint Chief Complaint  Patient presents with  . Shortness of Breath    HPI Darrell Johnson is a 52 y.o. male.  HPI   Darrell Johnson is a 52 y.o. male, with a history of CHF, ESRD on dialysis, anemia, asthma, HTN, hyperlipidemia, and tobacco use, presenting to the ED with shortness of breath beginning this morning.  Coughing and congestion since yesterday. Has used his albuterol inhaler 4 times today.  Received DuoNeb via EMS. Recent history of productive cough and sinus congestion, seen by PCP on Jan 8,  diagnosed with right-sided otitis media and sinusitis, prescribed 10 days of Augmentin.  Last dialysis was Friday, January 25.  Patient is on a Monday, Wednesday, and Friday schedule at [facility] on [address] in [place].  Denies fever/chills, N/V/D, peripheral edema, chest pain, abdominal pain, or any other complaints.  Past Medical History:  Diagnosis Date  . Anemia   . Anxiety   . Arthritis    "knees" (04/13/2017)  . Asthma   . CHF (congestive heart failure) ([place])   . Chronic kidney disease    stage 3-4 CKD, followed by Dr. Moshe Cipro  . ESRD (end stage renal disease) ([place])    ESRD due to HTN started dialysis Feb 2016  . ESRD (end stage renal disease) on dialysis [facility])    "MWF; [facility]" (04/13/2017)  . Gout, unspecified 08/13/2009   Qualifier: Diagnosis of  By: Redmond Pulling  MD, Mateo Flow    . H1N1 influenza    March 2016  . History of blood transfusion 02/2017   "related to OR"  . History of syphilis   . HIV infection ([place]) 1980's   on ART therapy since, followed by ID clinic, complicated  by neuropathy  . HTN (hypertension)   . Hyperlipidemia    hypertrygliceridemia determined ti be secondary to ART therpay  . Hypertension   . HYPERTENSION 05/08/2006  . 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 Pneumonia    "once; years  ago" (04/13/2017)  . Rib fractures 01/2009  . Seizure disorder ([place])   . Seizures ([place])    last seizure was in the 1990s, pt has family history of seizures; "probably related to alcohol" (04/13/2017)  . 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 Syphilis 1997   history of syphilis 1997  . Tobacco abuse     Patient Active Problem List   Diagnosis Date Noted  . Moderate mitral regurgitation   . Acute exacerbation of CHF (congestive heart failure) ([place]) 04/12/2017  . Acute respiratory failure with hypoxia ([place]) 03/29/2017  . Scrotal abscess 03/09/2017  . Trigger finger of right hand 09/11/2016  . Abscess of groin, right 09/04/2016  . Hyperlipidemia 03/31/2016  . Anxiety state 03/11/2016  . ESRD (end stage renal disease) ([place]) 01/28/2016  . Lumbar radiculopathy 01/28/2016  . Seasonal allergies   . Healthcare maintenance 05/03/2015  . Anemia in chronic kidney disease 08/30/2014  . ESRD on dialysis ([place]) 12/27/2013  . Drug noncompliance 11/07/2013  . History of syphilis 11/07/2013  . Arthritis 09/09/2013  . Tobacco abuse 09/07/2013  . Flash pulmonary edema ([place]) 06/11/2013  . Chronic pain disorder 04/28/2013  . HYPERTRIGLYCERIDEMIA 11/01/2009  . Gout 08/13/2009  . ERECTILE DYSFUNCTION  06/08/2008  . HIV disease ([place]) 05/08/2006  . PERIPHERAL NEUROPATHY 05/08/2006  . Essential hypertension 05/08/2006  . SEIZURE DISORDER 05/08/2006    Past Surgical History:  Procedure Laterality Date  . AV FISTULA PLACEMENT Right 12/02/2013   Procedure: ARTERIOVENOUS (AV) FISTULA CREATION;  Surgeon: Rosetta Posner, MD;  Location: [facility];  Service: Vascular;  Laterality: Right;  . INCISION AND DRAINAGE ABSCESS Right 03/09/2017   Procedure: INCISION AND DRAINAGE Right Scrotal Abscess;  Surgeon: Franchot Gallo, MD;  Location: [facility];  Service: Urology;   Laterality: Right;  . THROMBECTOMY Right ~ 2016   "AV fistula clotted off"       Home Medications    Prior to Admission medications   Medication Sig Start Date End Date Taking? Authorizing Provider  acetaminophen (TYLENOL) 500 MG tablet Take 1,000 mg by mouth every 6 (six) hours as needed for mild pain.    [provider]  albuterol (PROVENTIL HFA;VENTOLIN HFA) 108 (90 Base) MCG/ACT inhaler Inhale 2 puffs into the lungs every 4 (four) hours as needed for wheezing or shortness of breath. 05/12/17 07/10/17  Clent Demark, PA-C  amLODipine (NORVASC) 10 MG tablet Take 1 tablet (10 mg total) by mouth daily. 07/28/17   Clent Demark, PA-C  amoxicillin-clavulanate (AUGMENTIN) 875-125 MG tablet Take 1 tablet by mouth 2 (two) times daily. Patient not taking: Reported on 08/07/2017 07/28/17   Clent Demark, PA-C  buPROPion [facility]) 75 MG tablet TAKE 1/2 TABLET TWICE A DAY Patient taking differently: TAKE 37.5mg  by mouth TWICE A DAY 04/16/17   Axel Filler, MD  carvedilol (COREG) 25 MG tablet Take 1 tablet (25 mg total) by mouth 2 (two) times daily with a meal. 07/28/17   Clent Demark, PA-C  cyclobenzaprine (FLEXERIL) 10 MG tablet Take 1 tablet (10 mg total) by mouth at bedtime. 07/28/17   Clent Demark, PA-C  doxazosin (CARDURA) 8 MG tablet Take 1 tablet (8 mg total) by mouth at bedtime. 07/28/17   Clent Demark, PA-C  EDURANT 25 MG TABS tablet TAKE 1 TABLET (25 MG TOTAL) BY MOUTH DAILY WITH BREAKFAST. 12/22/16   Carlyle Basques, MD  ferric citrate (AURYXIA) 1 GM 210 MG(Fe) tablet Take 420 mg by mouth 2 (two) times daily with a meal.     [provider]  furosemide (LASIX) 40 MG tablet Take 1 tablet (40 mg total) by mouth as needed for fluid or edema. 07/28/17   Clent Demark, PA-C  hydrALAZINE (APRESOLINE) 100 MG tablet Take 0.5 tablets (50 mg total) by mouth 3 (three) times daily. 07/28/17   Clent Demark, PA-C  isosorbide dinitrate (ISORDIL)  30 MG tablet Take 1 tablet (30 mg total) by mouth 3 (three) times daily. 07/28/17   Clent Demark, PA-C  levofloxacin (LEVAQUIN) 500 MG tablet Take 1 tablet (500 mg total) by mouth every other day. 08/18/17   Jaleigh Mccroskey C, PA-C  lisinopril (PRINIVIL,ZESTRIL) 10 MG tablet TAKE 1 TABLET BY MOUTH EVERYDAY AT BEDTIME 07/28/17   Clent Demark, PA-C  montelukast (SINGULAIR) 10 MG tablet Take 1 tablet (10 mg total) by mouth at bedtime. 12/17/16   Jule Ser, DO  pravastatin (PRAVACHOL) 40 MG tablet TAKE 1 TABLET (40 MG TOTAL) BY MOUTH EVERY EVENING. 01/27/17 01/27/18  Jule Ser, DO  TIVICAY 50 MG tablet TAKE 1 TABLET (50 MG TOTAL) BY MOUTH DAILY. 12/22/16   Carlyle Basques, MD  VIREAD 300 MG tablet TAKE 1 TABLET BY MOUTH ONCE A WEEK ON  SUNDAYS 07/28/17   Clent Demark, PA-C    Family History Family History  Problem Relation Age of Onset  . Cancer Mother   . Hypertension Mother   . COPD Father   . Hypertension Father   . Diabetes Sister   . Hypertension Sister   . Diabetes Brother   . Hypertension Brother   . Stroke Neg Hx     Social History Social History   Tobacco Use  . Smoking status: Light Tobacco Smoker    Packs/day: 0.10    Years: 25.00    Pack years: 2.50    Types: Cigarettes    Start date: 01/10/2016    Last attempt to quit: 04/07/2016    Years since quitting: 1.3  . Smokeless tobacco: Never Used  Substance Use Topics  . Alcohol use: No    Alcohol/week: 0.0 oz    Comment: 04/13/2017 "quit ~ 2014"  . Drug use: No     Allergies   Patient has no known allergies.   Review of Systems Review of Systems  Constitutional: Negative for chills, diaphoresis and fever.  HENT: Negative for sore throat and trouble swallowing.   Respiratory: Positive for cough and shortness of breath.   Cardiovascular: Negative for chest pain and leg swelling.  Gastrointestinal: Negative for abdominal pain, diarrhea, nausea and vomiting.  All other systems reviewed and are  negative.    Physical Exam Updated Vital Signs BP (!) 206/85 (BP Location: Left Arm)   Pulse (!) 108   Temp 98.8 F (37.1 C) (Oral)   Resp (!) 30   Ht 5\' 9"  (1.753 m)   Wt 110.2 kg (243 lb)   SpO2 99%   BMI 35.88 kg/m   Physical Exam  Constitutional: He appears well-developed and well-nourished. No distress.  HENT:  Head: Normocephalic and atraumatic.  Eyes: Conjunctivae are normal.  Neck: Neck supple.  Cardiovascular: Normal rate, regular rhythm, normal heart sounds and intact distal pulses.  Pulmonary/Chest: Tachypnea noted. He has decreased breath sounds. He has wheezes.  Patient shows increased work of breathing.  Speaks in short phrases with conversational dyspnea.  Abdominal: Soft. There is no tenderness. There is no guarding.  Musculoskeletal: He exhibits no edema.  Lymphadenopathy:    He has no cervical adenopathy.  Neurological: He is alert.  Skin: Skin is warm and dry. He is not diaphoretic.  Psychiatric: He has a normal mood and affect. His behavior is normal.  Nursing note and vitals reviewed.    ED Treatments / Results  Labs (all labs ordered are listed, but only abnormal results are displayed) Labs Reviewed  COMPREHENSIVE METABOLIC PANEL - Abnormal; Notable for the following components:      Result Value   Chloride 98 (*)    BUN 45 (*)    Creatinine, Ser 11.70 (*)    Calcium 11.9 (*)    ALT 10 (*)    GFR calc non Af Amer 4 (*)    GFR calc Af Amer 5 (*)    Anion gap 16 (*)    All other components within normal limits  CBC WITH DIFFERENTIAL/PLATELET - Abnormal; Notable for the following components:   WBC 3.6 (*)    RDW 21.3 (*)    Platelets 102 (*)    All other components within normal limits  I-STAT CHEM 8, ED - Abnormal; Notable for the following components:   Chloride 100 (*)    BUN 41 (*)    Creatinine, Ser 11.80 (*)    All  other components within normal limits  INFLUENZA PANEL BY PCR (TYPE A & B)  BRAIN NATRIURETIC PEPTIDE  I-STAT  TROPONIN, ED    EKG  EKG Interpretation  Date/Time:  Sunday August 16 2017 17:24:16 EST Ventricular Rate:  115 PR Interval:    QRS Duration: 100 QT Interval:  318 QTC Calculation: 440 R Axis:   81 Text Interpretation:  Sinus tachycardia Probable left atrial enlargement RSR' in V1 or V2, right VCD or RVH ST elev, probable normal early repol pattern When compared to prior, slightly sharper T waves and faster rate.  No STEMI Confirmed by Antony Blackbird [phone]) on 08/16/2017 7:37:13 PM       Radiology Dg Chest 2 View  Result Date: 08/16/2017 CLINICAL DATA:  Productive cough. Shortness of breath. Right-sided chest pain. EXAM: CHEST  2 VIEW COMPARISON:  05/12/2017 FINDINGS: The heart size and mediastinal contours are within normal limits. Right pleural thickening and multiple old right rib fracture deformities are unchanged in appearance. New airspace opacity is seen in the right lower lobe, suspicious for pneumonia. Left lung is clear. IMPRESSION: New right lower lobe airspace disease, suspicious for pneumonia. Recommend clinical correlation, and followup PA and lateral chest X-ray in several weeks to ensure resolution and exclude underlying malignancy. Multiple old right rib fracture deformities and stable pleural-parenchymal scarring. Electronically Signed   By: Earle Gell M.D.   On: 08/16/2017 18:34    Procedures Procedures (including critical care time)  Medications Ordered in ED Medications  levofloxacin (LEVAQUIN) tablet 750 mg (750 mg Oral Given 08/16/17 2007)  acetaminophen (TYLENOL) tablet 650 mg (650 mg Oral Given 08/16/17 2007)     Initial Impression / Assessment and Plan / ED Course  I have reviewed the triage vital signs and the nursing notes.  Pertinent labs & imaging results that were available during my care of the patient were reviewed by me and considered in my medical decision making (see chart for details).  Clinical Course as of Aug 16 2009  Nancy Fetter Aug 16, 2017   1748 Reevaluated patient.  Work of breathing has significantly improved.  Patient able to speak in full sentences.  Lung sounds have improved.  [SJ]  3220 Discussed imaging results with patient. Patient's wheezing has returned. No increased work of breathing.   [SJ]    Clinical Course User Index [SJ] Antwine Agosto C, PA-C     Patient presents with coughing and shortness of breath.  After initial evaluation, patient improved significantly.  Presented on supplemental O2, however, patient quickly showed significant improvement and thus did not require this any longer.  During his time in the ED, patient was noted to maintain SPO2 at 98-99% on room air and 96% while ambulating on room air. Shared decision-making was used regarding admission based on the patient's finding of suspected pneumonia on chest x-ray and his initial presentation.  Patient is adamantly requesting not to be admitted.  Patient started on oral antibiotic therapy.  He will follow-up with his PCP.  Strict return precautions discussed.  Patient and patient's wife at the bedside voice understanding of all instructions and are comfortable with discharge.  Findings and plan of care discussed with Antony Blackbird, MD. Dr. Sherry Ruffing personally evaluated and examined this patient.  Vitals:   08/16/17 1729 08/16/17 1815 08/16/17 1830 08/16/17 1845  BP:  (!) 177/80 (!) 160/144 (!) 182/84  Pulse:  88 85 86  Resp:  17 (!) 21 17  Temp:      TempSrc:  SpO2:  96% 99% 97%  Weight: 110.2 kg (243 lb)     Height: 5\' 9"  (1.753 m)        Final Clinical Impressions(s) / ED Diagnoses   Final diagnoses:  Community acquired pneumonia of right lower lobe of lung [facility])    ED Discharge Orders        Ordered    levofloxacin (LEVAQUIN) 500 MG tablet  Every other day     08/16/17 2005       Layla Maw 08/16/17 2015    Tegeler, Gwenyth Allegra, MD 08/16/17 2337

## 2017-08-16 NOTE — ED Notes (Signed)
Patient back from  X-ray 

## 2017-08-16 NOTE — ED Notes (Signed)
Pt ambulated with steady gait in hallway with heart rate in the 90s and O2 96% for entirety

## 2017-08-16 NOTE — ED Triage Notes (Signed)
Patient arrived via EMS. Woke up short of breath. Attempted to manage with home albuterol treatments X4. Difficulty increased around 1600 today. EMS provide Albuterol X1 and Atrovent X!. Hx of CHF and ESRD. Receives dialysis M, W, F.

## 2017-08-17 ENCOUNTER — Telehealth: Payer: Self-pay

## 2017-08-17 DIAGNOSIS — E8770 Fluid overload, unspecified: Secondary | ICD-10-CM | POA: Diagnosis not present

## 2017-08-17 DIAGNOSIS — D631 Anemia in chronic kidney disease: Secondary | ICD-10-CM | POA: Diagnosis not present

## 2017-08-17 DIAGNOSIS — N2581 Secondary hyperparathyroidism of renal origin: Secondary | ICD-10-CM | POA: Diagnosis not present

## 2017-08-17 DIAGNOSIS — N186 End stage renal disease: Secondary | ICD-10-CM | POA: Diagnosis not present

## 2017-08-17 DIAGNOSIS — E8779 Other fluid overload: Secondary | ICD-10-CM | POA: Diagnosis not present

## 2017-08-17 NOTE — Telephone Encounter (Signed)
Received a fax from [employer] stating the patient had received a 30 day supply of Viread tabs 300 mg. The fax also states that the prescribed medication is not under the covered formulary, and would like the prescriber to switch the medication to one of the preferred formulary. I called Patent attorney and spoke with Santiago Glad who stated the provider would have to switch the Viread to one of the preferred medications on the formulary. I stated I would route a message to [name] to see what she would like to do. Pharr

## 2017-08-18 DIAGNOSIS — E8770 Fluid overload, unspecified: Secondary | ICD-10-CM | POA: Diagnosis not present

## 2017-08-18 DIAGNOSIS — N186 End stage renal disease: Secondary | ICD-10-CM | POA: Diagnosis not present

## 2017-08-18 DIAGNOSIS — D631 Anemia in chronic kidney disease: Secondary | ICD-10-CM | POA: Diagnosis not present

## 2017-08-18 DIAGNOSIS — N2581 Secondary hyperparathyroidism of renal origin: Secondary | ICD-10-CM | POA: Diagnosis not present

## 2017-08-18 DIAGNOSIS — E8779 Other fluid overload: Secondary | ICD-10-CM | POA: Diagnosis not present

## 2017-08-19 DIAGNOSIS — D631 Anemia in chronic kidney disease: Secondary | ICD-10-CM | POA: Diagnosis not present

## 2017-08-19 DIAGNOSIS — E8770 Fluid overload, unspecified: Secondary | ICD-10-CM | POA: Diagnosis not present

## 2017-08-19 DIAGNOSIS — E8779 Other fluid overload: Secondary | ICD-10-CM | POA: Diagnosis not present

## 2017-08-19 DIAGNOSIS — N2581 Secondary hyperparathyroidism of renal origin: Secondary | ICD-10-CM | POA: Diagnosis not present

## 2017-08-19 DIAGNOSIS — N186 End stage renal disease: Secondary | ICD-10-CM | POA: Diagnosis not present

## 2017-08-19 NOTE — Telephone Encounter (Signed)
Will look into it

## 2017-08-20 ENCOUNTER — Telehealth: Payer: Self-pay | Admitting: Pharmacist Clinician (PhC)/ Clinical Pharmacy Specialist

## 2017-08-20 DIAGNOSIS — I120 Hypertensive chronic kidney disease with stage 5 chronic kidney disease or end stage renal disease: Secondary | ICD-10-CM | POA: Diagnosis not present

## 2017-08-20 DIAGNOSIS — Z992 Dependence on renal dialysis: Secondary | ICD-10-CM | POA: Diagnosis not present

## 2017-08-20 DIAGNOSIS — N186 End stage renal disease: Secondary | ICD-10-CM | POA: Diagnosis not present

## 2017-08-20 NOTE — Telephone Encounter (Signed)
Tried to call him to bring back for an appt so we can simplify his regimen for ESRD. Apparently, some insurance issue with TDF now. Wonder if they are requiring generic. Couldn't reach him and no VM set up.

## 2017-08-21 DIAGNOSIS — N186 End stage renal disease: Secondary | ICD-10-CM | POA: Diagnosis not present

## 2017-08-21 DIAGNOSIS — D631 Anemia in chronic kidney disease: Secondary | ICD-10-CM | POA: Diagnosis not present

## 2017-08-21 DIAGNOSIS — N2581 Secondary hyperparathyroidism of renal origin: Secondary | ICD-10-CM | POA: Diagnosis not present

## 2017-08-21 DIAGNOSIS — Z992 Dependence on renal dialysis: Secondary | ICD-10-CM | POA: Diagnosis not present

## 2017-08-21 DIAGNOSIS — I120 Hypertensive chronic kidney disease with stage 5 chronic kidney disease or end stage renal disease: Secondary | ICD-10-CM | POA: Diagnosis not present

## 2017-08-21 DIAGNOSIS — D509 Iron deficiency anemia, unspecified: Secondary | ICD-10-CM | POA: Diagnosis not present

## 2017-08-23 ENCOUNTER — Other Ambulatory Visit (INDEPENDENT_AMBULATORY_CARE_PROVIDER_SITE_OTHER): Payer: Self-pay | Admitting: Physician Assistant

## 2017-08-23 DIAGNOSIS — M542 Cervicalgia: Secondary | ICD-10-CM

## 2017-08-23 DIAGNOSIS — G4700 Insomnia, unspecified: Secondary | ICD-10-CM

## 2017-08-24 ENCOUNTER — Telehealth: Payer: Self-pay | Admitting: Pharmacist Clinician (PhC)/ Clinical Pharmacy Specialist

## 2017-08-24 DIAGNOSIS — N2581 Secondary hyperparathyroidism of renal origin: Secondary | ICD-10-CM | POA: Diagnosis not present

## 2017-08-24 DIAGNOSIS — Z992 Dependence on renal dialysis: Secondary | ICD-10-CM | POA: Diagnosis not present

## 2017-08-24 DIAGNOSIS — N186 End stage renal disease: Secondary | ICD-10-CM | POA: Diagnosis not present

## 2017-08-24 DIAGNOSIS — D509 Iron deficiency anemia, unspecified: Secondary | ICD-10-CM | POA: Diagnosis not present

## 2017-08-24 DIAGNOSIS — D631 Anemia in chronic kidney disease: Secondary | ICD-10-CM | POA: Diagnosis not present

## 2017-08-24 NOTE — Telephone Encounter (Signed)
Got in touch with Darrell Johnson today to bring him in so we can simplify regimen due ESRD. Apparently, there is some issue now with covering TDF. He'll come in on Thursday.

## 2017-08-26 ENCOUNTER — Encounter: Payer: Self-pay | Admitting: Pharmacist Clinician (PhC)/ Clinical Pharmacy Specialist

## 2017-08-26 DIAGNOSIS — N186 End stage renal disease: Secondary | ICD-10-CM | POA: Diagnosis not present

## 2017-08-26 DIAGNOSIS — N2581 Secondary hyperparathyroidism of renal origin: Secondary | ICD-10-CM | POA: Diagnosis not present

## 2017-08-26 DIAGNOSIS — D631 Anemia in chronic kidney disease: Secondary | ICD-10-CM | POA: Diagnosis not present

## 2017-08-26 DIAGNOSIS — Z992 Dependence on renal dialysis: Secondary | ICD-10-CM | POA: Diagnosis not present

## 2017-08-26 DIAGNOSIS — D509 Iron deficiency anemia, unspecified: Secondary | ICD-10-CM | POA: Diagnosis not present

## 2017-08-27 ENCOUNTER — Ambulatory Visit (INDEPENDENT_AMBULATORY_CARE_PROVIDER_SITE_OTHER): Payer: Medicare Other | Admitting: Physician Assistant

## 2017-08-27 ENCOUNTER — Ambulatory Visit (INDEPENDENT_AMBULATORY_CARE_PROVIDER_SITE_OTHER): Payer: Medicare Other | Admitting: Pharmacist Clinician (PhC)/ Clinical Pharmacy Specialist

## 2017-08-27 ENCOUNTER — Encounter (INDEPENDENT_AMBULATORY_CARE_PROVIDER_SITE_OTHER): Payer: Self-pay | Admitting: Physician Assistant

## 2017-08-27 ENCOUNTER — Other Ambulatory Visit: Payer: Self-pay

## 2017-08-27 VITALS — BP 169/79 | HR 91 | Temp 98.9°F | Resp 18 | Ht 69.0 in | Wt 253.0 lb

## 2017-08-27 DIAGNOSIS — I10 Essential (primary) hypertension: Secondary | ICD-10-CM

## 2017-08-27 DIAGNOSIS — J181 Lobar pneumonia, unspecified organism: Secondary | ICD-10-CM

## 2017-08-27 DIAGNOSIS — Z09 Encounter for follow-up examination after completed treatment for conditions other than malignant neoplasm: Secondary | ICD-10-CM | POA: Diagnosis not present

## 2017-08-27 DIAGNOSIS — J189 Pneumonia, unspecified organism: Secondary | ICD-10-CM

## 2017-08-27 DIAGNOSIS — Z23 Encounter for immunization: Secondary | ICD-10-CM | POA: Diagnosis not present

## 2017-08-27 DIAGNOSIS — B20 Human immunodeficiency virus [HIV] disease: Secondary | ICD-10-CM | POA: Diagnosis not present

## 2017-08-27 MED ORDER — DOLUTEGRAVIR-RILPIVIRINE 50-25 MG PO TABS: 1.0000 | ORAL_TABLET | Freq: Every day | ORAL | 11 refills | Status: DC

## 2017-08-27 MED ORDER — CLONIDINE HCL 0.1 MG PO TABS: ORAL_TABLET | ORAL | 3 refills | Status: DC

## 2017-08-27 MED ORDER — ZIDOVUDINE 300 MG PO TABS: 300.0000 mg | ORAL_TABLET | Freq: Every day | ORAL | 11 refills | Status: DC

## 2017-08-27 MED ORDER — BLOOD PRESSURE MONITOR DEVI: 1.0000 | Freq: Every day | 0 refills | Status: DC

## 2017-08-27 MED FILL — ZIDOVUDINE 300 MG TABLET: 300 | 30 days supply | Qty: 30 | Fill #0

## 2017-08-27 MED FILL — JULUCA 50-25 MG TAB: 50-25 | 30 days supply | Qty: 30 | Fill #0

## 2017-08-27 NOTE — Progress Notes (Signed)
HPI: Darrell Johnson is a 52 y.o. male who is here to see pharmacy to consolidate his ART.   Allergies: No Known Allergies  Vitals:    Past Medical History: Past Medical History:  Diagnosis Date  . Anemia   . Anxiety   . Arthritis    "knees" (04/13/2017)  . Asthma   . CHF (congestive heart failure) ([place])   . Chronic kidney disease    stage 3-4 CKD, followed by Dr. Moshe Cipro  . ESRD (end stage renal disease) ([place])    ESRD due to HTN started dialysis Feb 2016  . ESRD (end stage renal disease) on dialysis [facility])    "MWF; [facility]" (04/13/2017)  . Gout, unspecified 08/13/2009   Qualifier: Diagnosis of  By: Redmond Pulling  MD, Mateo Flow    . H1N1 influenza    March 2016  . History of blood transfusion 02/2017   "related to OR"  . History of syphilis   . HIV infection ([place]) 1980's   on ART therapy since, followed by ID clinic, complicated  by neuropathy  . HTN (hypertension)   . Hyperlipidemia    hypertrygliceridemia determined ti be secondary to ART therpay  . Hypertension   . HYPERTENSION 05/08/2006  . 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 Pneumonia    "once; years ago" (04/13/2017)  . Rib fractures 01/2009  . Seizure disorder ([place])   . Seizures ([place])    last seizure was in the 1990s, pt has family history of seizures; "probably related to alcohol" (04/13/2017)  . 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 Syphilis 1997   history of syphilis 1997  . Tobacco abuse     Social History: Social History   Socioeconomic History  . Marital status: Married    Spouse name: Not on file  . Number of children: Not on file  . Years of education: Not on file  . Highest education level: Not on file  Social Needs  . Financial resource strain: Not on file  . Food insecurity - worry: Not on file  . Food insecurity - inability: Not on file  . Transportation  needs - medical: Not on file  . Transportation needs - non-medical: Not on file  Occupational History  . Not on file  Tobacco Use  . Smoking status: Light Tobacco Smoker    Packs/day: 0.10    Years: 25.00    Pack years: 2.50    Types: Cigarettes    Start date: 01/10/2016    Last attempt to quit: 04/07/2016    Years since quitting: 1.3  . Smokeless tobacco: Never Used  Substance and Sexual Activity  . Alcohol use: No    Alcohol/week: 0.0 oz    Comment: 04/13/2017 "quit ~ 2014"  . Drug use: No  . Sexual activity: No    Partners: Female    Comment: given condoms  Other Topics Concern  . Not on file  Social History Narrative   ** Merged History Encounter **        Previous Regimen: Kaletra/cobivir/tenofovir>>Kaletra/combivir/isentress>>Darunavir/r/combivir/Isentress>>Prezcobix/zidovudine/Viread  Current Regimen: Tivicay/viread/rilpivirine  Labs: HIV 1 RNA Quant (copies/mL)  Date Value  10/01/2016 28 (H)  09/24/2015 <20  11/06/2014 <20   CD4 T Cell Abs (/uL)  Date Value  10/01/2016 600  09/24/2015 570  11/22/2014 270 (L)   Hep B S Ab (no units)  Date Value  08/17/2014 POS (A)   Hepatitis B Surface Ag (no units)  Date Value  06/17/2016 Negative   HCV Ab (no units)  Date Value  09/14/2006 NO    CrCl: Estimated Creatinine Clearance: 9 mL/min (A) (by C-G formula based on SCr of 11.8 mg/dL (H)).  Lipids:    Component Value Date/Time   CHOL 121 (L) 09/24/2015 1103   TRIG 99 09/24/2015 1103   HDL 40 09/24/2015 1103   CHOLHDL 3.0 09/24/2015 1103   VLDL 20 09/24/2015 1103   LDLCALC 61 09/24/2015 1103    Assessment: Darrell Johnson is an ESRD HIV who is here today to see pharmacy to consolidate his ART. He has not seen Darrell Johnson since March of 2018. He has been compliance with his medications and is going under a re-evaluation at [facility] for his renal transplant. They are trying to get him to stop smoking. He was recently seen by the ID doc at [facility] for clearance of his  evaluation. It was mention that once he has the transplant, we have several options to simplify his regimen. I had to look way back to evaluate his hx of antitretrovirals. It's listed above. I couldn't find any old genotype, though. 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 Darrell Johnson asked [place] to bring him back to simplify his regimen now since he is taking some meds daily and one weekly. With the anticipation of his potential transplant, we will try to avoid a PI based regimen because of potential interactions with his immunosuppressive drugs. Therefore, I will change his tivicay/rilpivirine to Juluca and substitute tenofovir with zidovudine. This will provide a complete regimen with 2 daily tablets. Counseled him to take it with dinner since it would be post HD on those days. We will get his HIV labs today and schedule him for a follow up with Darrell Johnson in 6 wks so we can repeat labs at that time. Darrell Johnson long will mail out his meds.   Recommendations:  Stop Tivicay/Edurant/Viread Start Juluca 1 PO daily with dinner Start zidovudine 300mg  PO daily with dinner HIV labs today F/u with Darrell Johnson on 3/19 Menveo #2 today   Darrell Johnson, PharmD, BCPS, AAHIVP, CPP Clinical [facility] [place] 08/27/2017, 9:56 AM

## 2017-08-27 NOTE — Progress Notes (Signed)
Subjective:  Patient ID: Darrell Johnson, male    DOB: Sep 15, 1965  Age: 52 y.o. MRN: [id number]  CC: HTN   HPI  Darrell Johnson a 52 y.o.malewith a medical history of anemia, anxiety, asthma, CHF, ESRD, Gout, Syphillis, HIV, HTN, HLD, PNA, rib fractures, seizures, tobacco abuse, and chronic sphenoidal sinusitis presents to f/u on HTN. Last BP 156/80 mmHg one month ago.  BP in clinic today 169/79. Pt states his blood pressure is usually in the "150s". Goes to dialysis three times per week. Does not monitor blood pressure at home. Denies CP, palpitations, SOB, HA, tingling, numbness, weakness, presyncope, syncope, PND, orthopnea, or abdominal pain.     In the interim, patient went to ED 11 days ago and diagnosed with community acquired pneumonia. Prescribed Levofloxacin 500 mg. Has already received Prevnar and Pneumovax vaccines. States he is feeling much better and only coughs occasionally. No malaise, fever, or chills.   Outpatient Medications Prior to Visit  Medication Sig Dispense Refill  . acetaminophen (TYLENOL) 500 MG tablet Take 1,000 mg by mouth every 6 (six) hours as needed for mild pain.    Marland Kitchen amLODipine (NORVASC) 10 MG tablet Take 1 tablet (10 mg total) by mouth daily. 90 tablet 3  . buPROPion (WELLBUTRIN) 75 MG tablet TAKE 1/2 TABLET TWICE A DAY (Patient taking differently: TAKE 37.5mg  by mouth TWICE A DAY) 60 tablet 1  . carvedilol (COREG) 25 MG tablet Take 1 tablet (25 mg total) by mouth 2 (two) times daily with a meal. 180 tablet 3  . Dolutegravir-Rilpivirine (JULUCA) 50-25 MG TABS Take 1 tablet by mouth daily with supper. 30 tablet 11  . doxazosin (CARDURA) 8 MG tablet Take 1 tablet (8 mg total) by mouth at bedtime. 90 tablet 3  . ferric citrate (AURYXIA) 1 GM 210 MG(Fe) tablet Take 420 mg by mouth 2 (two) times daily with a meal.     . furosemide (LASIX) 40 MG tablet Take 1 tablet (40 mg total) by mouth as needed for fluid or edema. 90 tablet 3  . hydrALAZINE  (APRESOLINE) 100 MG tablet Take 0.5 tablets (50 mg total) by mouth 3 (three) times daily. 270 tablet 3  . isosorbide dinitrate (ISORDIL) 30 MG tablet Take 1 tablet (30 mg total) by mouth 3 (three) times daily. 270 tablet 3  . lisinopril (PRINIVIL,ZESTRIL) 10 MG tablet TAKE 1 TABLET BY MOUTH EVERYDAY AT BEDTIME 90 tablet 3  . montelukast (SINGULAIR) 10 MG tablet Take 1 tablet (10 mg total) by mouth at bedtime. 30 tablet 2  . pravastatin (PRAVACHOL) 40 MG tablet TAKE 1 TABLET (40 MG TOTAL) BY MOUTH EVERY EVENING. 90 tablet 3  . zidovudine (RETROVIR) 300 MG tablet Take 1 tablet (300 mg total) by mouth daily with supper. 30 tablet 11  . albuterol (PROVENTIL HFA;VENTOLIN HFA) 108 (90 Base) MCG/ACT inhaler Inhale 2 puffs into the lungs every 4 (four) hours as needed for wheezing or shortness of breath. 1 Inhaler 5  . cyclobenzaprine (FLEXERIL) 10 MG tablet Take 1 tablet (10 mg total) by mouth at bedtime. (Patient not taking: Reported on 08/27/2017) 30 tablet 0   No facility-administered medications prior to visit.      ROS Review of Systems  Constitutional: Negative for chills, fever and malaise/fatigue.  Eyes: Negative for blurred vision.  Respiratory: Positive for cough (mild). Negative for shortness of breath.   Cardiovascular: Negative for chest pain and palpitations.  Gastrointestinal: Negative for abdominal pain and nausea.  Genitourinary: Negative for dysuria and  hematuria.  Musculoskeletal: Negative for joint pain and myalgias.  Skin: Negative for rash.  Neurological: Negative for tingling and headaches.  Psychiatric/Behavioral: Negative for depression. The patient is not nervous/anxious.     Objective:   Vitals:   08/27/17 1335  BP: (!) 169/79  Pulse: 91  Resp: 18  Temp: 98.9 F (37.2 C)  SpO2: 97%    Physical Exam  Constitutional: He is oriented to person, place, and time.  Well developed, well nourished, NAD, polite  HENT:  Head: Normocephalic and atraumatic.  Eyes: No  scleral icterus.  Neck: Normal range of motion. Neck supple. No thyromegaly present.  Cardiovascular: Normal rate, regular rhythm and normal heart sounds.  No LE edema  Pulmonary/Chest: Effort normal and breath sounds normal. No respiratory distress. He has no wheezes. He has no rales.  Abdominal: Soft. Bowel sounds are normal. He exhibits no distension. There is no tenderness.  Musculoskeletal: He exhibits no edema.  Neurological: He is alert and oriented to person, place, and time. No cranial nerve deficit. Coordination normal.  Skin: Skin is warm and dry. No rash noted. No erythema. No pallor.  Psychiatric: He has a normal mood and affect. His behavior is normal. Thought content normal.  Vitals reviewed.    Assessment & Plan:    1. Hypertension, unspecified type - Begin Blood Pressure Monitor DEVI; 1 each by Does not apply route daily.  Dispense: 1 Device; Refill: 0 - Begin cloNIDine (CATAPRES) 0.1 MG tablet; Take one tablet if blood pressure is 180/110 mmHg or greater. May take one pill up to twice per day.  Dispense: 60 tablet; Refill: 3  2. Hospital discharge follow-up - Records reviewed  3. Community acquired pneumonia of right lower lobe of lung ([place]) - Resolved with Levaquin  Meds ordered this encounter  Medications  . Blood Pressure Monitor DEVI    Sig: 1 each by Does not apply route daily.    Dispense:  1 Device    Refill:  0    Order Specific Question:   Supervising Provider    Answer:   Tresa Garter [id number]  . cloNIDine (CATAPRES) 0.1 MG tablet    Sig: Take one tablet if blood pressure is 180/110 mmHg or greater. May take one pill up to twice per day.    Dispense:  60 tablet    Refill:  3    Order Specific Question:   Supervising Provider    Answer:   Tresa Garter [id number]    Follow-up: Return in about 8 weeks (around 10/22/2017) for HTN bring log.   Clent Demark PA

## 2017-08-27 NOTE — Patient Instructions (Signed)
Please take Clonidine as follows: Take one tablet if blood pressure is 180/110 mmHg or greater. May take one pill up to twice per day.  Hypertension Hypertension, commonly called high blood pressure, is when the force of blood pumping through the arteries is too strong. The arteries are the blood vessels that carry blood from the heart throughout the body. Hypertension forces the heart to work harder to pump blood and may cause arteries to become narrow or stiff. Having untreated or uncontrolled hypertension can cause heart attacks, strokes, kidney disease, and other problems. A blood pressure reading consists of a higher number over a lower number. Ideally, your blood pressure should be below 120/80. The first ("top") number is called the systolic pressure. It is a measure of the pressure in your arteries as your heart beats. The second ("bottom") number is called the diastolic pressure. It is a measure of the pressure in your arteries as the heart relaxes. What are the causes? The cause of this condition is not known. What increases the risk? Some risk factors for high blood pressure are under your control. Others are not. Factors you can change  Smoking.  Having type 2 diabetes mellitus, high cholesterol, or both.  Not getting enough exercise or physical activity.  Being overweight.  Having too much fat, sugar, calories, or salt (sodium) in your diet.  Drinking too much alcohol. Factors that are difficult or impossible to change  Having chronic kidney disease.  Having a family history of high blood pressure.  Age. Risk increases with age.  Race. You may be at higher risk if you are African-American.  Gender. Men are at higher risk than women before age 45. After age 18, women are at higher risk than men.  Having obstructive sleep apnea.  Stress. What are the signs or symptoms? Extremely high blood pressure (hypertensive crisis) may  cause:  Headache.  Anxiety.  Shortness of breath.  Nosebleed.  Nausea and vomiting.  Severe chest pain.  Jerky movements you cannot control (seizures).  How is this diagnosed? This condition is diagnosed by measuring your blood pressure while you are seated, with your arm resting on a surface. The cuff of the blood pressure monitor will be placed directly against the skin of your upper arm at the level of your heart. It should be measured at least twice using the same arm. Certain conditions can cause a difference in blood pressure between your right and left arms. Certain factors can cause blood pressure readings to be lower or higher than normal (elevated) for a short period of time:  When your blood pressure is higher when you are in a health care provider's office than when you are at home, this is called white coat hypertension. Most people with this condition do not need medicines.  When your blood pressure is higher at home than when you are in a health care provider's office, this is called masked hypertension. Most people with this condition may need medicines to control blood pressure.  If you have a high blood pressure reading during one visit or you have normal blood pressure with other risk factors:  You may be asked to return on a different day to have your blood pressure checked again.  You may be asked to monitor your blood pressure at home for 1 week or longer.  If you are diagnosed with hypertension, you may have other blood or imaging tests to help your health care provider understand your overall risk for other conditions.  How is this treated? This condition is treated by making healthy lifestyle changes, such as eating healthy foods, exercising more, and reducing your alcohol intake. Your health care provider may prescribe medicine if lifestyle changes are not enough to get your blood pressure under control, and if:  Your systolic blood pressure is above  130.  Your diastolic blood pressure is above 80.  Your personal target blood pressure may vary depending on your medical conditions, your age, and other factors. Follow these instructions at home: Eating and drinking  Eat a diet that is high in fiber and potassium, and low in sodium, added sugar, and fat. An example eating plan is called the DASH (Dietary Approaches to Stop Hypertension) diet. To eat this way: ? Eat plenty of fresh fruits and vegetables. Try to fill half of your plate at each meal with fruits and vegetables. ? Eat whole grains, such as whole wheat pasta, brown rice, or whole grain bread. Fill about one quarter of your plate with whole grains. ? Eat or drink low-fat dairy products, such as skim milk or low-fat yogurt. ? Avoid fatty cuts of meat, processed or cured meats, and poultry with skin. Fill about one quarter of your plate with lean proteins, such as fish, chicken without skin, beans, eggs, and tofu. ? Avoid premade and processed foods. These tend to be higher in sodium, added sugar, and fat.  Reduce your daily sodium intake. Most people with hypertension should eat less than 1,500 mg of sodium a day.  Limit alcohol intake to no more than 1 drink a day for nonpregnant women and 2 drinks a day for men. One drink equals 12 oz of beer, 5 oz of wine, or 1 oz of hard liquor. Lifestyle  Work with your health care provider to maintain a healthy body weight or to lose weight. Ask what an ideal weight is for you.  Get at least 30 minutes of exercise that causes your heart to beat faster (aerobic exercise) most days of the week. Activities may include walking, swimming, or biking.  Include exercise to strengthen your muscles (resistance exercise), such as pilates or lifting weights, as part of your weekly exercise routine. Try to do these types of exercises for 30 minutes at least 3 days a week.  Do not use any products that contain nicotine or tobacco, such as cigarettes and  e-cigarettes. If you need help quitting, ask your health care provider.  Monitor your blood pressure at home as told by your health care provider.  Keep all follow-up visits as told by your health care provider. This is important. Medicines  Take over-the-counter and prescription medicines only as told by your health care provider. Follow directions carefully. Blood pressure medicines must be taken as prescribed.  Do not skip doses of blood pressure medicine. Doing this puts you at risk for problems and can make the medicine less effective.  Ask your health care provider about side effects or reactions to medicines that you should watch for. Contact a health care provider if:  You think you are having a reaction to a medicine you are taking.  You have headaches that keep coming back (recurring).  You feel dizzy.  You have swelling in your ankles.  You have trouble with your vision. Get help right away if:  You develop a severe headache or confusion.  You have unusual weakness or numbness.  You feel faint.  You have severe pain in your chest or abdomen.  You vomit repeatedly.  You have trouble breathing. Summary  Hypertension is when the force of blood pumping through your arteries is too strong. If this condition is not controlled, it may put you at risk for serious complications.  Your personal target blood pressure may vary depending on your medical conditions, your age, and other factors. For most people, a normal blood pressure is less than 120/80.  Hypertension is treated with lifestyle changes, medicines, or a combination of both. Lifestyle changes include weight loss, eating a healthy, low-sodium diet, exercising more, and limiting alcohol. This information is not intended to replace advice given to you by your health care provider. Make sure you discuss any questions you have with your health care provider. Document Released: 07/07/2005 Document Revised: 06/04/2016  Document Reviewed: 06/04/2016 Elsevier Interactive Patient Education  [employer].

## 2017-08-27 NOTE — Patient Instructions (Signed)
Stop Tivicay, Liborio Nixon, and Viread Start Juluca 1 tablet daily at supper Start Zidovudine 1 tablet daily at super Follow up with [name] in 6 wks

## 2017-08-27 NOTE — Patient Outreach (Addendum)
[facility]) Care Management  08/27/2017  AAYUSH GELPI 01-01-1966 [id number]  RNCM met client at primary care office visit. Client with recent emergency room visit for pneumonia. Client reports he just finished his antibiotic.   Assessment: 52 year old with history of chronic pain disorder, flash pulmonary edema, HTN, ESRD/HD, Heart failure, Human Immuno- deficiency virus.   Blood pressure elevated during visit. Client reports he does not have a blood pressure cuff. He reports financial strain. Medication Clonidine added on a prn bases. Reinforced the importance of checking blood pressure prior to taking the medication. parameters given per provider.           Client reports that he will going to smoking cessation classes at [facility] on 09/15/17. He acknowledges smoking approximately 1.5 cigarettes/day. He states he has now be placed back on the kidney transplant list.                                                                                                                                                                                      Plan: due to client's financial constraints, provide blood pressure monitor. Continue to follow for HTN education.   THN CM Care Plan Problem One     Most Recent Value  Care Plan Problem One  post emergency room visit for orthostatic hypotension post dialysis.  Role Documenting the Problem One  Care Management Coordinator  Care Plan for Problem One  Not Active  THN CM Short Term Goal #1   client will have a blood pressure cuff for home monitoring within the next 30 days.  THN CM Short Term Goal #1 Start Date  07/13/17    [facility] CM Care Plan Problem Two     Most Recent Value  Care Plan Problem Two  assistance with communication with provider.  Role Documenting the Problem Two  Care Management Coordinator  Care Plan for Problem Two  Not Active  THN CM Short Term Goal #1   Client will verbalize communication with  provider/follow up regarding regarding pain  THN CM Short Term Goal #1 Start Date  08/07/17  [facility] CM Short Term Goal #1 Met Date   08/27/17    [facility] CM Care Plan Problem Three     Most Recent Value  Care Plan Problem Three  elevated blood pressure as reflected at primary care office visit.  Role Documenting the Problem Three  Care Management Powers for Problem Three  Active  THN Long Term Goal   client will verbalize at least three strategies for HTN management within the next 31-45 days.  THN Long Term Goal Start Date  08/27/17  Interventions for Problem Three Long Term Goal  RNCM attended primary care office visit with client, reinforced importance of taking medications as prescribed. reviewed best technique for checking blood pressure.  THN CM Short Term Goal #1   client will check and record blood pressures within the next 30 days.  THN CM Short Term Goal #1 Start Date  08/27/17  Interventions for Short Term Goal #1  provided [facility] organizer calender and reinforced how to use  [facility] CM Short Term Goal #2   client will verbalize when to call the doctor within the next 30 days.  THN CM Short Term Goal #2 Start Date  08/27/17  Interventions for Short Term Goal #2  reinforced parameters instructed by primary care.      [name], RN, MSN, [place] Coordinator Cell: [phone]

## 2017-08-28 ENCOUNTER — Other Ambulatory Visit: Payer: Self-pay

## 2017-08-28 DIAGNOSIS — Z992 Dependence on renal dialysis: Secondary | ICD-10-CM | POA: Diagnosis not present

## 2017-08-28 DIAGNOSIS — D631 Anemia in chronic kidney disease: Secondary | ICD-10-CM | POA: Diagnosis not present

## 2017-08-28 DIAGNOSIS — D509 Iron deficiency anemia, unspecified: Secondary | ICD-10-CM | POA: Diagnosis not present

## 2017-08-28 DIAGNOSIS — N186 End stage renal disease: Secondary | ICD-10-CM | POA: Diagnosis not present

## 2017-08-28 DIAGNOSIS — N2581 Secondary hyperparathyroidism of renal origin: Secondary | ICD-10-CM | POA: Diagnosis not present

## 2017-08-28 LAB — T-HELPER CELL (CD4) - (RCID CLINIC ONLY)
CD4 % Helper T Cell: 32 % — ABNORMAL LOW (ref 33–55)
CD4 T Cell Abs: 390 /uL — ABNORMAL LOW (ref 400–2700)

## 2017-08-28 NOTE — Patient Outreach (Signed)
[facility]) Care Management  08/28/2017  [facility] BLOODWORTH [birth year] [id number]  Care coordination: RNCM called to discuss follow up visit. No answer. Unable to leave message.  [name], RN, MSN, [place] Coordinator Cell: [phone]

## 2017-08-29 LAB — HIV-1 RNA QUANT-NO REFLEX-BLD
HIV 1 RNA QUANT: 66 {copies}/mL — AB
HIV-1 RNA Quant, Log: 1.82 Log copies/mL — ABNORMAL HIGH

## 2017-08-31 DIAGNOSIS — N2581 Secondary hyperparathyroidism of renal origin: Secondary | ICD-10-CM | POA: Diagnosis not present

## 2017-08-31 DIAGNOSIS — D631 Anemia in chronic kidney disease: Secondary | ICD-10-CM | POA: Diagnosis not present

## 2017-08-31 DIAGNOSIS — D509 Iron deficiency anemia, unspecified: Secondary | ICD-10-CM | POA: Diagnosis not present

## 2017-08-31 DIAGNOSIS — N186 End stage renal disease: Secondary | ICD-10-CM | POA: Diagnosis not present

## 2017-08-31 DIAGNOSIS — Z992 Dependence on renal dialysis: Secondary | ICD-10-CM | POA: Diagnosis not present

## 2017-09-02 ENCOUNTER — Telehealth (INDEPENDENT_AMBULATORY_CARE_PROVIDER_SITE_OTHER): Payer: Self-pay | Admitting: Physician Assistant

## 2017-09-02 DIAGNOSIS — Z992 Dependence on renal dialysis: Secondary | ICD-10-CM | POA: Diagnosis not present

## 2017-09-02 DIAGNOSIS — N186 End stage renal disease: Secondary | ICD-10-CM | POA: Diagnosis not present

## 2017-09-02 DIAGNOSIS — N2581 Secondary hyperparathyroidism of renal origin: Secondary | ICD-10-CM | POA: Diagnosis not present

## 2017-09-02 DIAGNOSIS — D509 Iron deficiency anemia, unspecified: Secondary | ICD-10-CM | POA: Diagnosis not present

## 2017-09-02 DIAGNOSIS — D631 Anemia in chronic kidney disease: Secondary | ICD-10-CM | POA: Diagnosis not present

## 2017-09-02 NOTE — Telephone Encounter (Signed)
Pt called to speak with the nurse to see if the paper for DMD was sent or not and why , please follow up

## 2017-09-03 NOTE — Telephone Encounter (Signed)
It is done and in the "fax out" shelf in reception.

## 2017-09-03 NOTE — Telephone Encounter (Signed)
Please advise on this paperwork.

## 2017-09-04 DIAGNOSIS — Z992 Dependence on renal dialysis: Secondary | ICD-10-CM | POA: Diagnosis not present

## 2017-09-04 DIAGNOSIS — N186 End stage renal disease: Secondary | ICD-10-CM | POA: Diagnosis not present

## 2017-09-04 DIAGNOSIS — D631 Anemia in chronic kidney disease: Secondary | ICD-10-CM | POA: Diagnosis not present

## 2017-09-04 DIAGNOSIS — N2581 Secondary hyperparathyroidism of renal origin: Secondary | ICD-10-CM | POA: Diagnosis not present

## 2017-09-04 DIAGNOSIS — D509 Iron deficiency anemia, unspecified: Secondary | ICD-10-CM | POA: Diagnosis not present

## 2017-09-07 ENCOUNTER — Ambulatory Visit (INDEPENDENT_AMBULATORY_CARE_PROVIDER_SITE_OTHER): Payer: Medicare Other | Admitting: Physician Assistant

## 2017-09-07 DIAGNOSIS — D631 Anemia in chronic kidney disease: Secondary | ICD-10-CM | POA: Diagnosis not present

## 2017-09-07 DIAGNOSIS — Z992 Dependence on renal dialysis: Secondary | ICD-10-CM | POA: Diagnosis not present

## 2017-09-07 DIAGNOSIS — D509 Iron deficiency anemia, unspecified: Secondary | ICD-10-CM | POA: Diagnosis not present

## 2017-09-07 DIAGNOSIS — N186 End stage renal disease: Secondary | ICD-10-CM | POA: Diagnosis not present

## 2017-09-07 DIAGNOSIS — N2581 Secondary hyperparathyroidism of renal origin: Secondary | ICD-10-CM | POA: Diagnosis not present

## 2017-09-09 DIAGNOSIS — D631 Anemia in chronic kidney disease: Secondary | ICD-10-CM | POA: Diagnosis not present

## 2017-09-09 DIAGNOSIS — N2581 Secondary hyperparathyroidism of renal origin: Secondary | ICD-10-CM | POA: Diagnosis not present

## 2017-09-09 DIAGNOSIS — N186 End stage renal disease: Secondary | ICD-10-CM | POA: Diagnosis not present

## 2017-09-09 DIAGNOSIS — D509 Iron deficiency anemia, unspecified: Secondary | ICD-10-CM | POA: Diagnosis not present

## 2017-09-09 DIAGNOSIS — Z992 Dependence on renal dialysis: Secondary | ICD-10-CM | POA: Diagnosis not present

## 2017-09-10 ENCOUNTER — Ambulatory Visit (INDEPENDENT_AMBULATORY_CARE_PROVIDER_SITE_OTHER): Payer: Medicare Other | Admitting: Physician Assistant

## 2017-09-10 ENCOUNTER — Encounter (INDEPENDENT_AMBULATORY_CARE_PROVIDER_SITE_OTHER): Payer: Self-pay | Admitting: Physician Assistant

## 2017-09-10 ENCOUNTER — Other Ambulatory Visit: Payer: Self-pay

## 2017-09-10 VITALS — BP 154/75 | HR 90 | Temp 98.4°F | Resp 18 | Ht 69.0 in | Wt 246.0 lb

## 2017-09-10 DIAGNOSIS — J323 Chronic sphenoidal sinusitis: Secondary | ICD-10-CM | POA: Diagnosis not present

## 2017-09-10 DIAGNOSIS — J4540 Moderate persistent asthma, uncomplicated: Secondary | ICD-10-CM | POA: Diagnosis not present

## 2017-09-10 MED ORDER — PHENYLEPHRINE HCL 1 % NA SOLN: 1.0000 [drp] | Freq: Four times a day (QID) | NASAL | 0 refills | Status: DC | PRN

## 2017-09-10 MED ORDER — ALL-IN-ONE NEBULIZER SYSTEM MISC: 1.0000 | Freq: Every day | 0 refills | Status: DC | PRN

## 2017-09-10 MED ORDER — AMOXICILLIN-POT CLAVULANATE 500-125 MG PO TABS: 1.0000 | ORAL_TABLET | Freq: Two times a day (BID) | ORAL | 0 refills | Status: DC

## 2017-09-10 MED ORDER — GUAIFENESIN ER 1200 MG PO TB12
1.0000 | ORAL_TABLET | Freq: Two times a day (BID) | ORAL | 0 refills | Status: AC
Start: 2017-09-10 — End: 2017-09-15

## 2017-09-10 NOTE — Patient Outreach (Signed)
[place] [facility]) Care Management  09/10/2017  Darrell Johnson 09-17-1965 [id number]  Subjective: client reports he has a follow up appointment with his primary care provider today.  Objective: none  Assessment:  Assessment: 52 year old with history of chronic pain disorder, flash pulmonary edema, HTN, ESRD/HD, Heart failure, Human Immuno- deficiency virus.   RNCM called to follow up on blood pressure monitoring. Client reports that he received his blood pressure monitor, but has not checked his blood pressure much. He states from the checks in the dialysis clinic, it has been alright. He states he checked his blood pressure last it was around 148/90. He reports he has not had to take his as needed blood pressure medication.  Client reports he is transferring to a new dialysis center on [address]. Client asked about how to get a new nebulizer machine stating that his mask is messed up. He states the nebulizer machine he is using was given to him by someone. RNCM advised client to follow up with his primary care provider. He states he has a follow up appointment today and will ask him today for a prescription.  No other questions.  Plan: home visit next month.  [name], RN, MSN, [place] Coordinator Cell: [phone]

## 2017-09-10 NOTE — Progress Notes (Signed)
Subjective:  Patient ID: Darrell Johnson, male    DOB: 09-08-1965  Age: 52 y.o. MRN: [id number]  CC: nasal congestion  HPI Darrell Johnson a 52 y.o.malewith a medical history of anemia, anxiety, asthma, CHF, ESRD, Gout, Syphillis, HIV, HTN, HLD, PNA, rib fractures, seizures, tobacco abuse, and chronic sphenoidal sinusitispresents with two week hx of nasal congestion, facial pressure, eye pressure, and cough. Has tried OTC Alkaseltzer Cough and Cold with little relief of symptoms. No close contacts with the same. Does not endorse any other symptoms or complaints. Would like a new nebulizer machine as he has an old machine that is malfunctioning.    Outpatient Medications Prior to Visit  Medication Sig Dispense Refill  . acetaminophen (TYLENOL) 500 MG tablet Take 1,000 mg by mouth every 6 (six) hours as needed for mild pain.    Marland Kitchen amLODipine (NORVASC) 10 MG tablet Take 1 tablet (10 mg total) by mouth daily. 90 tablet 3  . Blood Pressure Monitor DEVI 1 each by Does not apply route daily. 1 Device 0  . buPROPion (WELLBUTRIN) 75 MG tablet TAKE 1/2 TABLET TWICE A DAY (Patient taking differently: TAKE 37.5mg  by mouth TWICE A DAY) 60 tablet 1  . carvedilol (COREG) 25 MG tablet Take 1 tablet (25 mg total) by mouth 2 (two) times daily with a meal. 180 tablet 3  . cloNIDine (CATAPRES) 0.1 MG tablet Take one tablet if blood pressure is 180/110 mmHg or greater. May take one pill up to twice per day. 60 tablet 3  . Dolutegravir-Rilpivirine (JULUCA) 50-25 MG TABS Take 1 tablet by mouth daily with supper. 30 tablet 11  . doxazosin (CARDURA) 8 MG tablet Take 1 tablet (8 mg total) by mouth at bedtime. 90 tablet 3  . ferric citrate (AURYXIA) 1 GM 210 MG(Fe) tablet Take 420 mg by mouth 2 (two) times daily with a meal.     . furosemide (LASIX) 40 MG tablet Take 1 tablet (40 mg total) by mouth as needed for fluid or edema. 90 tablet 3  . hydrALAZINE (APRESOLINE) 100 MG tablet Take 0.5 tablets (50 mg total)  by mouth 3 (three) times daily. 270 tablet 3  . isosorbide dinitrate (ISORDIL) 30 MG tablet Take 1 tablet (30 mg total) by mouth 3 (three) times daily. 270 tablet 3  . lisinopril (PRINIVIL,ZESTRIL) 10 MG tablet TAKE 1 TABLET BY MOUTH EVERYDAY AT BEDTIME 90 tablet 3  . montelukast (SINGULAIR) 10 MG tablet Take 1 tablet (10 mg total) by mouth at bedtime. 30 tablet 2  . pravastatin (PRAVACHOL) 40 MG tablet TAKE 1 TABLET (40 MG TOTAL) BY MOUTH EVERY EVENING. 90 tablet 3  . zidovudine (RETROVIR) 300 MG tablet Take 1 tablet (300 mg total) by mouth daily with supper. 30 tablet 11  . albuterol (PROVENTIL HFA;VENTOLIN HFA) 108 (90 Base) MCG/ACT inhaler Inhale 2 puffs into the lungs every 4 (four) hours as needed for wheezing or shortness of breath. 1 Inhaler 5  . cyclobenzaprine (FLEXERIL) 10 MG tablet Take 1 tablet (10 mg total) by mouth at bedtime. (Patient not taking: Reported on 09/10/2017) 30 tablet 0   No facility-administered medications prior to visit.      ROS Review of Systems  Constitutional: Negative for chills, fever and malaise/fatigue.  HENT: Positive for congestion and sinus pain. Negative for ear pain and sore throat.   Eyes: Positive for pain. Negative for blurred vision.  Respiratory: Positive for cough. Negative for shortness of breath.   Cardiovascular: Negative for chest pain  and palpitations.  Gastrointestinal: Negative for abdominal pain and nausea.  Genitourinary: Negative for dysuria and hematuria.  Musculoskeletal: Negative for joint pain and myalgias.  Skin: Negative for rash.  Neurological: Negative for tingling and headaches.  Psychiatric/Behavioral: Negative for depression. The patient is not nervous/anxious.     Objective:  BP (!) 154/75 (BP Location: Left Arm, Patient Position: Sitting, Cuff Size: Large)   Pulse 90   Temp 98.4 F (36.9 C) (Oral)   Resp 18   Ht 5\' 9"  (1.753 m)   Wt 246 lb (111.6 kg)   SpO2 98%   BMI 36.33 kg/m   BP/Weight 09/10/2017  08/27/2017 11/12/9561  Systolic BP 875 643 329  Diastolic BP 75 79 90  Wt. (Lbs) 246 253 243  BMI 36.33 37.36 35.88      Physical Exam  Constitutional: He is oriented to person, place, and time.  Well developed, well nourished, mildly ill, polite  HENT:  Head: Normocephalic and atraumatic.  Right TM erythematous and mildly bulging, Left TM erythematous. Turbinates severely hypertrophic. Mild postnasal drip.   Eyes: No scleral icterus.  Neck: Normal range of motion. Neck supple. No thyromegaly present.  Cardiovascular: Normal rate, regular rhythm and normal heart sounds.  Pulmonary/Chest: Effort normal and breath sounds normal.  Musculoskeletal: He exhibits no edema.  Lymphadenopathy:    He has no cervical adenopathy.  Neurological: He is alert and oriented to person, place, and time. No cranial nerve deficit. Coordination normal.  Skin: Skin is warm and dry. No rash noted. No erythema. No pallor.  Psychiatric: He has a normal mood and affect. His behavior is normal. Thought content normal.  Vitals reviewed.    Assessment & Plan:   1. Chronic sphenoidal sinusitis - Guaifenesin (MUCINEX MAXIMUM STRENGTH) 1200 MG TB12; Take 1 tablet (1,200 mg total) by mouth 2 (two) times daily for 5 days.  Dispense: 10 tablet; Refill: 0 - phenylephrine (NEO-SYNEPHRINE) 1 % nasal spray; Place 1 drop into both nostrils every 6 (six) hours as needed for congestion.  Dispense: 30 mL; Refill: 0 - amoxicillin-clavulanate (AUGMENTIN) 500-125 MG tablet; Take 1 tablet (500 mg total) by mouth 2 (two) times daily.  Dispense: 10 tablet; Refill: 0  2. Moderate persistent asthma without complication - ALL-IN-ONE NEBULIZER SYSTEM MISC; 1 each by Does not apply route daily as needed.  Dispense: 1 each; Refill: 0   Meds ordered this encounter  Medications  . Guaifenesin (MUCINEX MAXIMUM STRENGTH) 1200 MG TB12    Sig: Take 1 tablet (1,200 mg total) by mouth 2 (two) times daily for 5 days.    Dispense:  10 tablet     Refill:  0    Order Specific Question:   Supervising Provider    Answer:   Tresa Garter [id number]  . phenylephrine (NEO-SYNEPHRINE) 1 % nasal spray    Sig: Place 1 drop into both nostrils every 6 (six) hours as needed for congestion.    Dispense:  30 mL    Refill:  0    Order Specific Question:   Supervising Provider    Answer:   Tresa Garter [id number]  . amoxicillin-clavulanate (AUGMENTIN) 500-125 MG tablet    Sig: Take 1 tablet (500 mg total) by mouth 2 (two) times daily.    Dispense:  10 tablet    Refill:  0    Order Specific Question:   Supervising Provider    Answer:   Tresa Garter [id number]  . ALL-IN-ONE NEBULIZER SYSTEM MISC    Sig:  1 each by Does not apply route daily as needed.    Dispense:  1 each    Refill:  0    Indication Asthma. Dispense covered machine and supplies.    Order Specific Question:   Supervising Provider    Answer:   Tresa Garter [[id number]]    Follow-up: Return if symptoms worsen or fail to improve.   Clent Demark PA

## 2017-09-10 NOTE — Patient Instructions (Signed)

## 2017-09-11 DIAGNOSIS — D631 Anemia in chronic kidney disease: Secondary | ICD-10-CM | POA: Diagnosis not present

## 2017-09-11 DIAGNOSIS — Z992 Dependence on renal dialysis: Secondary | ICD-10-CM | POA: Diagnosis not present

## 2017-09-11 DIAGNOSIS — N2581 Secondary hyperparathyroidism of renal origin: Secondary | ICD-10-CM | POA: Diagnosis not present

## 2017-09-11 DIAGNOSIS — N186 End stage renal disease: Secondary | ICD-10-CM | POA: Diagnosis not present

## 2017-09-11 DIAGNOSIS — D509 Iron deficiency anemia, unspecified: Secondary | ICD-10-CM | POA: Diagnosis not present

## 2017-09-14 DIAGNOSIS — D631 Anemia in chronic kidney disease: Secondary | ICD-10-CM | POA: Diagnosis not present

## 2017-09-14 DIAGNOSIS — D509 Iron deficiency anemia, unspecified: Secondary | ICD-10-CM | POA: Diagnosis not present

## 2017-09-14 DIAGNOSIS — N186 End stage renal disease: Secondary | ICD-10-CM | POA: Diagnosis not present

## 2017-09-14 DIAGNOSIS — N2581 Secondary hyperparathyroidism of renal origin: Secondary | ICD-10-CM | POA: Diagnosis not present

## 2017-09-14 DIAGNOSIS — Z992 Dependence on renal dialysis: Secondary | ICD-10-CM | POA: Diagnosis not present

## 2017-09-15 ENCOUNTER — Other Ambulatory Visit (INDEPENDENT_AMBULATORY_CARE_PROVIDER_SITE_OTHER): Payer: Self-pay | Admitting: *Deleted

## 2017-09-15 DIAGNOSIS — Z79899 Other long term (current) drug therapy: Secondary | ICD-10-CM | POA: Diagnosis not present

## 2017-09-15 DIAGNOSIS — Z23 Encounter for immunization: Secondary | ICD-10-CM | POA: Diagnosis not present

## 2017-09-15 DIAGNOSIS — J4540 Moderate persistent asthma, uncomplicated: Secondary | ICD-10-CM

## 2017-09-15 DIAGNOSIS — F17209 Nicotine dependence, unspecified, with unspecified nicotine-induced disorders: Secondary | ICD-10-CM | POA: Diagnosis not present

## 2017-09-15 DIAGNOSIS — N185 Chronic kidney disease, stage 5: Secondary | ICD-10-CM | POA: Diagnosis not present

## 2017-09-15 DIAGNOSIS — I10 Essential (primary) hypertension: Secondary | ICD-10-CM | POA: Diagnosis not present

## 2017-09-15 DIAGNOSIS — B20 Human immunodeficiency virus [HIV] disease: Secondary | ICD-10-CM | POA: Diagnosis not present

## 2017-09-15 MED ORDER — ALL-IN-ONE NEBULIZER SYSTEM MISC: 1.0000 | Freq: Every day | 0 refills | Status: DC | PRN

## 2017-09-15 NOTE — Telephone Encounter (Signed)
Printed to take to [facility]

## 2017-09-16 DIAGNOSIS — N2581 Secondary hyperparathyroidism of renal origin: Secondary | ICD-10-CM | POA: Diagnosis not present

## 2017-09-16 DIAGNOSIS — D509 Iron deficiency anemia, unspecified: Secondary | ICD-10-CM | POA: Diagnosis not present

## 2017-09-16 DIAGNOSIS — Z992 Dependence on renal dialysis: Secondary | ICD-10-CM | POA: Diagnosis not present

## 2017-09-16 DIAGNOSIS — D631 Anemia in chronic kidney disease: Secondary | ICD-10-CM | POA: Diagnosis not present

## 2017-09-16 DIAGNOSIS — N186 End stage renal disease: Secondary | ICD-10-CM | POA: Diagnosis not present

## 2017-09-18 DIAGNOSIS — N2581 Secondary hyperparathyroidism of renal origin: Secondary | ICD-10-CM | POA: Diagnosis not present

## 2017-09-18 DIAGNOSIS — D631 Anemia in chronic kidney disease: Secondary | ICD-10-CM | POA: Diagnosis not present

## 2017-09-18 DIAGNOSIS — Z992 Dependence on renal dialysis: Secondary | ICD-10-CM | POA: Diagnosis not present

## 2017-09-18 DIAGNOSIS — I120 Hypertensive chronic kidney disease with stage 5 chronic kidney disease or end stage renal disease: Secondary | ICD-10-CM | POA: Diagnosis not present

## 2017-09-18 DIAGNOSIS — D509 Iron deficiency anemia, unspecified: Secondary | ICD-10-CM | POA: Diagnosis not present

## 2017-09-18 DIAGNOSIS — N186 End stage renal disease: Secondary | ICD-10-CM | POA: Diagnosis not present

## 2017-09-21 DIAGNOSIS — N186 End stage renal disease: Secondary | ICD-10-CM | POA: Diagnosis not present

## 2017-09-21 DIAGNOSIS — D631 Anemia in chronic kidney disease: Secondary | ICD-10-CM | POA: Diagnosis not present

## 2017-09-21 DIAGNOSIS — N2581 Secondary hyperparathyroidism of renal origin: Secondary | ICD-10-CM | POA: Diagnosis not present

## 2017-09-21 DIAGNOSIS — D509 Iron deficiency anemia, unspecified: Secondary | ICD-10-CM | POA: Diagnosis not present

## 2017-09-22 NOTE — Telephone Encounter (Signed)
FWD to PCP. Tempestt S Roberts, CMA  

## 2017-09-23 DIAGNOSIS — N2581 Secondary hyperparathyroidism of renal origin: Secondary | ICD-10-CM | POA: Diagnosis not present

## 2017-09-23 DIAGNOSIS — N186 End stage renal disease: Secondary | ICD-10-CM | POA: Diagnosis not present

## 2017-09-23 DIAGNOSIS — D509 Iron deficiency anemia, unspecified: Secondary | ICD-10-CM | POA: Diagnosis not present

## 2017-09-23 DIAGNOSIS — D631 Anemia in chronic kidney disease: Secondary | ICD-10-CM | POA: Diagnosis not present

## 2017-09-24 ENCOUNTER — Ambulatory Visit: Payer: Self-pay

## 2017-09-24 ENCOUNTER — Other Ambulatory Visit: Payer: Self-pay

## 2017-09-24 MED FILL — ZIDOVUDINE 300 MG TABLET: 300 | 30 days supply | Qty: 30 | Fill #1

## 2017-09-24 MED FILL — JULUCA 50-25 MG TAB: 50-25 | 30 days supply | Qty: 30 | Fill #1

## 2017-09-24 NOTE — Patient Outreach (Signed)
[place] [facility]) Care Management  09/24/2017  Darrell Johnson [birth year] [id number]   Home visit scheduled for today. RNCM called prior to leaving for visit. Client states he is on his way to a family member's funeral service and request a call back to reschedule appointment.  Plan: RNCM will follow up tomorrow.  [name], RN, MSN, [place] Coordinator Cell: [phone]

## 2017-09-25 ENCOUNTER — Other Ambulatory Visit: Payer: Self-pay

## 2017-09-25 DIAGNOSIS — N186 End stage renal disease: Secondary | ICD-10-CM | POA: Diagnosis not present

## 2017-09-25 DIAGNOSIS — D631 Anemia in chronic kidney disease: Secondary | ICD-10-CM | POA: Diagnosis not present

## 2017-09-25 DIAGNOSIS — Z23 Encounter for immunization: Secondary | ICD-10-CM | POA: Diagnosis not present

## 2017-09-25 DIAGNOSIS — N2581 Secondary hyperparathyroidism of renal origin: Secondary | ICD-10-CM | POA: Diagnosis not present

## 2017-09-25 NOTE — Patient Outreach (Signed)
[place] [facility]) Care Management  09/25/2017  VIHAN SANTAGATA [birth year] [id number]   Subjective: Client with no questions or concerns at this time.  Assessment: 52 year old with history of ESRD/HD (Monday,Wednesday Friday); heart failure, chronic sinusitis, Human Immuno deficiency virus, tobacco use, gout, asthma.   Follow up call - Client is without questions or concerns at this time. He reports he is monitoring his blood pressure intermittently and has not required any as needed blood pressure medication. He also attends dialysis treatments three times/week. He states that he has received a new nebulizer machine and has no questions or concerns about how to operate.  Client is agreeable to transition to the health coach for continued HTN education/reinforcement.   RNCM confirmed client has contact number and encouraged to call as needed. Also reinforced 24 hour nurse advice line.  Plan: transition to health coach.  [name], RN, MSN, [place] Coordinator Cell: [phone]

## 2017-09-28 DIAGNOSIS — D631 Anemia in chronic kidney disease: Secondary | ICD-10-CM | POA: Diagnosis not present

## 2017-09-28 DIAGNOSIS — N2581 Secondary hyperparathyroidism of renal origin: Secondary | ICD-10-CM | POA: Diagnosis not present

## 2017-09-28 DIAGNOSIS — N186 End stage renal disease: Secondary | ICD-10-CM | POA: Diagnosis not present

## 2017-09-28 DIAGNOSIS — Z23 Encounter for immunization: Secondary | ICD-10-CM | POA: Diagnosis not present

## 2017-09-30 DIAGNOSIS — N2581 Secondary hyperparathyroidism of renal origin: Secondary | ICD-10-CM | POA: Diagnosis not present

## 2017-09-30 DIAGNOSIS — Z23 Encounter for immunization: Secondary | ICD-10-CM | POA: Diagnosis not present

## 2017-09-30 DIAGNOSIS — N186 End stage renal disease: Secondary | ICD-10-CM | POA: Diagnosis not present

## 2017-09-30 DIAGNOSIS — D631 Anemia in chronic kidney disease: Secondary | ICD-10-CM | POA: Diagnosis not present

## 2017-10-02 DIAGNOSIS — Z23 Encounter for immunization: Secondary | ICD-10-CM | POA: Diagnosis not present

## 2017-10-02 DIAGNOSIS — D631 Anemia in chronic kidney disease: Secondary | ICD-10-CM | POA: Diagnosis not present

## 2017-10-02 DIAGNOSIS — N2581 Secondary hyperparathyroidism of renal origin: Secondary | ICD-10-CM | POA: Diagnosis not present

## 2017-10-02 DIAGNOSIS — N186 End stage renal disease: Secondary | ICD-10-CM | POA: Diagnosis not present

## 2017-10-05 DIAGNOSIS — Z23 Encounter for immunization: Secondary | ICD-10-CM | POA: Diagnosis not present

## 2017-10-05 DIAGNOSIS — N2581 Secondary hyperparathyroidism of renal origin: Secondary | ICD-10-CM | POA: Diagnosis not present

## 2017-10-05 DIAGNOSIS — N186 End stage renal disease: Secondary | ICD-10-CM | POA: Diagnosis not present

## 2017-10-05 DIAGNOSIS — D631 Anemia in chronic kidney disease: Secondary | ICD-10-CM | POA: Diagnosis not present

## 2017-10-06 ENCOUNTER — Encounter: Payer: Self-pay | Admitting: Internal Medicine

## 2017-10-06 ENCOUNTER — Ambulatory Visit (INDEPENDENT_AMBULATORY_CARE_PROVIDER_SITE_OTHER): Payer: Medicare Other | Admitting: Internal Medicine

## 2017-10-06 VITALS — BP 144/78 | HR 87 | Temp 98.0°F | Wt 247.0 lb

## 2017-10-06 DIAGNOSIS — Z992 Dependence on renal dialysis: Secondary | ICD-10-CM

## 2017-10-06 DIAGNOSIS — G4700 Insomnia, unspecified: Secondary | ICD-10-CM | POA: Diagnosis not present

## 2017-10-06 DIAGNOSIS — I10 Essential (primary) hypertension: Secondary | ICD-10-CM | POA: Diagnosis not present

## 2017-10-06 DIAGNOSIS — S29012S Strain of muscle and tendon of back wall of thorax, sequela: Secondary | ICD-10-CM | POA: Diagnosis not present

## 2017-10-06 DIAGNOSIS — Z23 Encounter for immunization: Secondary | ICD-10-CM | POA: Diagnosis not present

## 2017-10-06 DIAGNOSIS — N186 End stage renal disease: Secondary | ICD-10-CM

## 2017-10-06 DIAGNOSIS — B20 Human immunodeficiency virus [HIV] disease: Secondary | ICD-10-CM

## 2017-10-06 MED ORDER — CYCLOBENZAPRINE HCL 10 MG PO TABS: 10.0000 mg | ORAL_TABLET | Freq: Three times a day (TID) | ORAL | 0 refills | Status: DC | PRN

## 2017-10-06 NOTE — Progress Notes (Signed)
[facility]: follow up for hiv disease  Patient ID: Darrell Johnson, male   DOB: February 05, 1966, 52 y.o.   MRN: [id number]  HPI Darrell Johnson is a 52yo M with HIV disease, well  Controlled, CD4 count of 390/VL66 in Feb 2019 currently on dolutegravir-rilpivirine-zidovudine. HTN, c/b ESRD on HD, also followed by Dr Darrell Johnson at [facility]  for renal transplantation. Patient was last seen on 2/26 at [facility] for follow-up, smoking cessation and given dose of shingrix, and plan to try to get him to lose 10#  In late January, patient reports having difficulty with insurance having TDF covered, thus he was changed from DLG-RPV-TDF weekly to DLG-RPV-ZDV. His VL has been in 39s ( at [facility] in Feb), as well as 66 here.   He reports having back pain for the last 4-6 wks that he thinks maybe due to back strain. No fever, chills, nightweats. He states it is sometimes aggravated when sleeping on left side. Localizes to left distal lat dorsi,  when he points to it.  Outpatient Encounter Medications as of 10/06/2017  Medication Sig  . acetaminophen (TYLENOL) 500 MG tablet Take 1,000 mg by mouth every 6 (six) hours as needed for mild pain.  Marland Kitchen albuterol (PROVENTIL HFA;VENTOLIN HFA) 108 (90 Base) MCG/ACT inhaler Inhale 2 puffs into the lungs every 4 (four) hours as needed for wheezing or shortness of breath.  . ALL-IN-ONE NEBULIZER SYSTEM MISC 1 each by Does not apply route daily as needed.  Marland Kitchen amLODipine (NORVASC) 10 MG tablet Take 1 tablet (10 mg total) by mouth daily.  Marland Kitchen amoxicillin-clavulanate (AUGMENTIN) 500-125 MG tablet Take 1 tablet (500 mg total) by mouth 2 (two) times daily.  . Blood Pressure Monitor DEVI 1 each by Does not apply route daily.  Marland Kitchen buPROPion (WELLBUTRIN) 75 MG tablet TAKE 1/2 TABLET TWICE A DAY (Patient taking differently: TAKE 37.5mg  by mouth TWICE A DAY)  . carvedilol (COREG) 25 MG tablet Take 1 tablet (25 mg total) by mouth 2 (two) times daily with a meal.  . cloNIDine (CATAPRES) 0.1 MG tablet Take one tablet  if blood pressure is 180/110 mmHg or greater. May take one pill up to twice per day.  . cyclobenzaprine (FLEXERIL) 10 MG tablet Take 1 tablet (10 mg total) by mouth at bedtime. (Patient not taking: Reported on 09/10/2017)  . Dolutegravir-Rilpivirine (JULUCA) 50-25 MG TABS Take 1 tablet by mouth daily with supper.  . doxazosin (CARDURA) 8 MG tablet Take 1 tablet (8 mg total) by mouth at bedtime.  . ferric citrate (AURYXIA) 1 GM 210 MG(Fe) tablet Take 420 mg by mouth 2 (two) times daily with a meal.   . furosemide (LASIX) 40 MG tablet Take 1 tablet (40 mg total) by mouth as needed for fluid or edema.  . hydrALAZINE (APRESOLINE) 100 MG tablet Take 0.5 tablets (50 mg total) by mouth 3 (three) times daily.  . isosorbide dinitrate (ISORDIL) 30 MG tablet Take 1 tablet (30 mg total) by mouth 3 (three) times daily.  Marland Kitchen lisinopril (PRINIVIL,ZESTRIL) 10 MG tablet TAKE 1 TABLET BY MOUTH EVERYDAY AT BEDTIME  . montelukast (SINGULAIR) 10 MG tablet Take 1 tablet (10 mg total) by mouth at bedtime.  . phenylephrine (NEO-SYNEPHRINE) 1 % nasal spray Place 1 drop into both nostrils every 6 (six) hours as needed for congestion.  . pravastatin (PRAVACHOL) 40 MG tablet TAKE 1 TABLET (40 MG TOTAL) BY MOUTH EVERY EVENING.  . zidovudine (RETROVIR) 300 MG tablet Take 1 tablet (300 mg total) by mouth daily with  supper.   No facility-administered encounter medications on file as of 10/06/2017.      Patient Active Problem List   Diagnosis Date Noted  . Moderate mitral regurgitation   . Acute exacerbation of CHF (congestive heart failure) ([place]) 04/12/2017  . Acute respiratory failure with hypoxia ([place]) 03/29/2017  . Scrotal abscess 03/09/2017  . Trigger finger of right hand 09/11/2016  . Abscess of groin, right 09/04/2016  . Hyperlipidemia 03/31/2016  . Anxiety state 03/11/2016  . ESRD (end stage renal disease) ([place]) 01/28/2016  . Lumbar radiculopathy 01/28/2016  . Seasonal allergies   . Healthcare maintenance  05/03/2015  . Anemia in chronic kidney disease 08/30/2014  . ESRD on dialysis ([place]) 12/27/2013  . Drug noncompliance 11/07/2013  . History of syphilis 11/07/2013  . Arthritis 09/09/2013  . Tobacco abuse 09/07/2013  . Flash pulmonary edema ([place]) 06/11/2013  . Chronic pain disorder 04/28/2013  . HYPERTRIGLYCERIDEMIA 11/01/2009  . Gout 08/13/2009  . ERECTILE DYSFUNCTION 06/08/2008  . HIV disease ([place]) 05/08/2006  . PERIPHERAL NEUROPATHY 05/08/2006  . Essential hypertension 05/08/2006  . SEIZURE DISORDER 05/08/2006     There are no preventive care reminders to display for this patient.   Review of Systems  Constitutional: Negative for fever, chills, diaphoresis, activity change, appetite change, fatigue and unexpected weight change.  HENT: Negative for congestion, sore throat, rhinorrhea, sneezing, trouble swallowing and sinus pressure.  Eyes: Negative for photophobia and visual disturbance.  Respiratory: Negative for cough, chest tightness, shortness of breath, wheezing and stridor.  Cardiovascular: Negative for chest pain, palpitations and leg swelling.  Gastrointestinal: Negative for nausea, vomiting, abdominal pain, diarrhea, constipation, blood in stool, abdominal distention and anal bleeding.  Genitourinary: Negative for dysuria, hematuria, flank pain and difficulty urinating.  Musculoskeletal: Negative for myalgias, back pain, joint swelling, arthralgias and gait problem.  Skin: Negative for color change, pallor, rash and wound.  Neurological: Negative for dizziness, tremors, weakness and light-headedness.  Hematological: Negative for adenopathy. Does not bruise/bleed easily.  Psychiatric/Behavioral: Negative for behavioral problems, confusion, sleep disturbance, dysphoric mood, decreased concentration and agitation.    Physical Exam  BP (!) 144/78   Pulse 87   Temp 98 F (36.7 C) (Oral)   Wt 247 lb (112 kg)   BMI 36.48 kg/m  Physical Exam  Constitutional: He is  oriented to person, place, and time. He appears well-developed and well-nourished. No distress.  HENT:  Mouth/Throat: Oropharynx is clear and moist. No oropharyngeal exudate.  Cardiovascular: Normal rate, regular rhythm and normal heart sounds. Exam reveals no gallop and no friction rub.  No murmur heard.  Pulmonary/Chest: Effort normal and breath sounds normal. No respiratory distress. He has no wheezes.  Back: firmness lat. Dorsi distal. Left side with some associated ttp Abdominal: Soft. Bowel sounds are normal. He exhibits no distension. There is no tenderness.  Neurological: He is alert and oriented to person, place, and time.  Skin: Skin is warm and dry. No rash noted. No erythema.  Psychiatric: He has a normal mood and affect. His behavior is normal.     Lab Results  Component Value Date   CD4TCELL 32 (L) 08/27/2017   Lab Results  Component Value Date   CD4TABS 390 (L) 08/27/2017   CD4TABS 600 10/01/2016   CD4TABS 570 09/24/2015   Lab Results  Component Value Date   HIV1RNAQUANT 66 (H) 08/27/2017   Lab Results  Component Value Date   HEPBSAB POS (A) 08/17/2014   Lab Results  Component Value Date   LABRPR NON  REAC 10/01/2016    CBC Lab Results  Component Value Date   WBC 3.6 (L) 08/16/2017   RBC 4.95 08/16/2017   HGB 14.3 08/16/2017   HCT 42.0 08/16/2017   PLT 102 (L) 08/16/2017   MCV 93.1 08/16/2017   MCH 30.7 08/16/2017   MCHC 33.0 08/16/2017   RDW 21.3 (H) 08/16/2017   LYMPHSABS 0.7 08/16/2017   MONOABS 0.2 08/16/2017   EOSABS 0.1 08/16/2017    BMET Lab Results  Component Value Date   NA 137 08/16/2017   K 4.8 08/16/2017   CL 100 (L) 08/16/2017   CO2 25 08/16/2017   GLUCOSE 77 08/16/2017   BUN 41 (H) 08/16/2017   CREATININE 11.80 (H) 08/16/2017   CALCIUM 11.9 (H) 08/16/2017   GFRNONAA 4 (L) 08/16/2017   GFRAA 5 (L) 08/16/2017      Assessment and Plan   hiv disease = will recheck viral load in 2 wk to see that his viral load is not any  higher preferably undetectable. May need to get back on tdf weekly dosing  Health maintenance = will give pneumo 23 booster today  Continue on smoking cessation.   msk strain = paraspinal tenderness/tense. Will give rx of flexeril plus having him do stretching exercise/tennisball ag wall to work out knots  HTN = fairly well controlled  ESRD = continues with current dialysis schedule

## 2017-10-06 NOTE — Patient Instructions (Signed)
Please come back beginning of April for lab work, and we will see you the 2nd week of april

## 2017-10-07 ENCOUNTER — Other Ambulatory Visit: Payer: Self-pay

## 2017-10-07 DIAGNOSIS — D631 Anemia in chronic kidney disease: Secondary | ICD-10-CM | POA: Diagnosis not present

## 2017-10-07 DIAGNOSIS — Z23 Encounter for immunization: Secondary | ICD-10-CM | POA: Diagnosis not present

## 2017-10-07 DIAGNOSIS — N186 End stage renal disease: Secondary | ICD-10-CM | POA: Diagnosis not present

## 2017-10-07 DIAGNOSIS — N2581 Secondary hyperparathyroidism of renal origin: Secondary | ICD-10-CM | POA: Diagnosis not present

## 2017-10-07 NOTE — Patient Outreach (Signed)
[facility]) Care Management  10/07/2017  Darrell Johnson February 10, 1966 [id number]   1st outreach attempt to the patient for initial assessment. No answer.  Unable to leave a voicemail ue to no answering machine pickup.  Plan:  RN Health Coach will make an outreach attempt to the patient in one business day  [place], BSN, [place]:  [phone]  Fax: [phone]

## 2017-10-08 ENCOUNTER — Other Ambulatory Visit: Payer: Self-pay

## 2017-10-08 NOTE — Patient Outreach (Signed)
[place] [facility]) Care Management  10/08/2017  DODGE ATOR [birth year] [id number]   2nd unsuccessful outreach attempt to the patient for initial assessment.  No answer.  Unable to leave a message.  Plan: RN Health Coach will send letter for outreach attempt. RN Health Coach will make 3rd telephone outreach attempt in ten business days. No answer I will proceed with case closure.     Lazaro Arms RN, BSN, [place] Direct Dial:  [phone]  Fax: [phone]

## 2017-10-09 DIAGNOSIS — N2581 Secondary hyperparathyroidism of renal origin: Secondary | ICD-10-CM | POA: Diagnosis not present

## 2017-10-09 DIAGNOSIS — N186 End stage renal disease: Secondary | ICD-10-CM | POA: Diagnosis not present

## 2017-10-09 DIAGNOSIS — Z23 Encounter for immunization: Secondary | ICD-10-CM | POA: Diagnosis not present

## 2017-10-09 DIAGNOSIS — D631 Anemia in chronic kidney disease: Secondary | ICD-10-CM | POA: Diagnosis not present

## 2017-10-12 DIAGNOSIS — N2581 Secondary hyperparathyroidism of renal origin: Secondary | ICD-10-CM | POA: Diagnosis not present

## 2017-10-12 DIAGNOSIS — Z23 Encounter for immunization: Secondary | ICD-10-CM | POA: Diagnosis not present

## 2017-10-12 DIAGNOSIS — D631 Anemia in chronic kidney disease: Secondary | ICD-10-CM | POA: Diagnosis not present

## 2017-10-12 DIAGNOSIS — N186 End stage renal disease: Secondary | ICD-10-CM | POA: Diagnosis not present

## 2017-10-13 ENCOUNTER — Emergency Department (HOSPITAL_COMMUNITY): Payer: Medicare Other

## 2017-10-13 ENCOUNTER — Other Ambulatory Visit: Payer: Self-pay

## 2017-10-13 ENCOUNTER — Inpatient Hospital Stay (HOSPITAL_COMMUNITY)
Admission: EM | Admit: 2017-10-13 | Discharge: 2017-10-15 | DRG: 371 | Disposition: A | Discharge status: Home / Self Care | Payer: Medicare Other | Attending: Internal Medicine | Admitting: Internal Medicine

## 2017-10-13 ENCOUNTER — Encounter (HOSPITAL_COMMUNITY): Payer: Self-pay | Admitting: Emergency Medicine

## 2017-10-13 DIAGNOSIS — R0602 Shortness of breath: Secondary | ICD-10-CM | POA: Diagnosis not present

## 2017-10-13 DIAGNOSIS — I959 Hypotension, unspecified: Secondary | ICD-10-CM | POA: Diagnosis not present

## 2017-10-13 DIAGNOSIS — D631 Anemia in chronic kidney disease: Secondary | ICD-10-CM | POA: Diagnosis present

## 2017-10-13 DIAGNOSIS — E8889 Other specified metabolic disorders: Secondary | ICD-10-CM | POA: Diagnosis present

## 2017-10-13 DIAGNOSIS — N186 End stage renal disease: Secondary | ICD-10-CM | POA: Diagnosis present

## 2017-10-13 DIAGNOSIS — E861 Hypovolemia: Secondary | ICD-10-CM | POA: Diagnosis not present

## 2017-10-13 DIAGNOSIS — I120 Hypertensive chronic kidney disease with stage 5 chronic kidney disease or end stage renal disease: Secondary | ICD-10-CM | POA: Diagnosis not present

## 2017-10-13 DIAGNOSIS — A084 Viral intestinal infection, unspecified: Secondary | ICD-10-CM | POA: Diagnosis present

## 2017-10-13 DIAGNOSIS — I9589 Other hypotension: Secondary | ICD-10-CM | POA: Diagnosis present

## 2017-10-13 DIAGNOSIS — B20 Human immunodeficiency virus [HIV] disease: Secondary | ICD-10-CM | POA: Diagnosis not present

## 2017-10-13 DIAGNOSIS — Z820 Family history of epilepsy and other diseases of the nervous system: Secondary | ICD-10-CM

## 2017-10-13 DIAGNOSIS — N2581 Secondary hyperparathyroidism of renal origin: Secondary | ICD-10-CM | POA: Diagnosis not present

## 2017-10-13 DIAGNOSIS — A0472 Enterocolitis due to Clostridium difficile, not specified as recurrent: Principal | ICD-10-CM | POA: Diagnosis present

## 2017-10-13 DIAGNOSIS — N19 Unspecified kidney failure: Secondary | ICD-10-CM | POA: Diagnosis not present

## 2017-10-13 DIAGNOSIS — Z992 Dependence on renal dialysis: Secondary | ICD-10-CM | POA: Diagnosis not present

## 2017-10-13 DIAGNOSIS — E785 Hyperlipidemia, unspecified: Secondary | ICD-10-CM | POA: Diagnosis present

## 2017-10-13 DIAGNOSIS — R404 Transient alteration of awareness: Secondary | ICD-10-CM | POA: Diagnosis not present

## 2017-10-13 DIAGNOSIS — M170 Bilateral primary osteoarthritis of knee: Secondary | ICD-10-CM | POA: Diagnosis present

## 2017-10-13 DIAGNOSIS — A0811 Acute gastroenteropathy due to Norwalk agent: Secondary | ICD-10-CM | POA: Diagnosis present

## 2017-10-13 DIAGNOSIS — Z87891 Personal history of nicotine dependence: Secondary | ICD-10-CM

## 2017-10-13 DIAGNOSIS — G40909 Epilepsy, unspecified, not intractable, without status epilepticus: Secondary | ICD-10-CM | POA: Diagnosis present

## 2017-10-13 DIAGNOSIS — Z720 Tobacco use: Secondary | ICD-10-CM | POA: Diagnosis present

## 2017-10-13 DIAGNOSIS — I10 Essential (primary) hypertension: Secondary | ICD-10-CM | POA: Diagnosis present

## 2017-10-13 DIAGNOSIS — I132 Hypertensive heart and chronic kidney disease with heart failure and with stage 5 chronic kidney disease, or end stage renal disease: Secondary | ICD-10-CM | POA: Diagnosis not present

## 2017-10-13 DIAGNOSIS — Z8249 Family history of ischemic heart disease and other diseases of the circulatory system: Secondary | ICD-10-CM

## 2017-10-13 DIAGNOSIS — I5032 Chronic diastolic (congestive) heart failure: Secondary | ICD-10-CM | POA: Diagnosis present

## 2017-10-13 DIAGNOSIS — R42 Dizziness and giddiness: Secondary | ICD-10-CM | POA: Diagnosis not present

## 2017-10-13 DIAGNOSIS — K449 Diaphragmatic hernia without obstruction or gangrene: Secondary | ICD-10-CM | POA: Diagnosis not present

## 2017-10-13 DIAGNOSIS — R197 Diarrhea, unspecified: Secondary | ICD-10-CM

## 2017-10-13 DIAGNOSIS — Z79899 Other long term (current) drug therapy: Secondary | ICD-10-CM

## 2017-10-13 DIAGNOSIS — R1084 Generalized abdominal pain: Secondary | ICD-10-CM

## 2017-10-13 DIAGNOSIS — J45909 Unspecified asthma, uncomplicated: Secondary | ICD-10-CM | POA: Diagnosis present

## 2017-10-13 DIAGNOSIS — R112 Nausea with vomiting, unspecified: Secondary | ICD-10-CM

## 2017-10-13 DIAGNOSIS — Z825 Family history of asthma and other chronic lower respiratory diseases: Secondary | ICD-10-CM

## 2017-10-13 LAB — CBC WITH DIFFERENTIAL/PLATELET
BASOS PCT: 0 %
Basophils Absolute: 0 10*3/uL (ref 0.0–0.1)
EOS ABS: 0.3 10*3/uL (ref 0.0–0.7)
Eosinophils Relative: 3 %
HCT: 43.7 % (ref 39.0–52.0)
HEMOGLOBIN: 14.3 g/dL (ref 13.0–17.0)
Lymphocytes Relative: 6 %
Lymphs Abs: 0.5 10*3/uL — ABNORMAL LOW (ref 0.7–4.0)
MCH: 32.7 pg (ref 26.0–34.0)
MCHC: 32.7 g/dL (ref 30.0–36.0)
MCV: 100 fL (ref 78.0–100.0)
Monocytes Absolute: 0.9 10*3/uL (ref 0.1–1.0)
Monocytes Relative: 9 %
Neutro Abs: 7.7 10*3/uL (ref 1.7–7.7)
Neutrophils Relative %: 82 %
PLATELETS: 199 10*3/uL (ref 150–400)
RBC: 4.37 MIL/uL (ref 4.22–5.81)
RDW: 19.2 % — ABNORMAL HIGH (ref 11.5–15.5)
WBC: 9.4 10*3/uL (ref 4.0–10.5)

## 2017-10-13 LAB — COMPREHENSIVE METABOLIC PANEL
ALBUMIN: 4.1 g/dL (ref 3.5–5.0)
ALK PHOS: 101 U/L (ref 38–126)
ALT: 13 U/L — AB (ref 17–63)
AST: 24 U/L (ref 15–41)
Anion gap: 17 — ABNORMAL HIGH (ref 5–15)
BUN: 40 mg/dL — ABNORMAL HIGH (ref 6–20)
CALCIUM: 8.5 mg/dL — AB (ref 8.9–10.3)
CHLORIDE: 94 mmol/L — AB (ref 101–111)
CO2: 22 mmol/L (ref 22–32)
CREATININE: 9.84 mg/dL — AB (ref 0.61–1.24)
GFR calc Af Amer: 6 mL/min — ABNORMAL LOW (ref >60)
GFR calc non Af Amer: 5 mL/min — ABNORMAL LOW (ref >60)
GLUCOSE: 107 mg/dL — AB (ref 65–99)
Potassium: 5.3 mmol/L — ABNORMAL HIGH (ref 3.5–5.1)
SODIUM: 133 mmol/L — AB (ref 135–145)
Total Bilirubin: 0.7 mg/dL (ref 0.3–1.2)
Total Protein: 8.2 g/dL — ABNORMAL HIGH (ref 6.5–8.1)

## 2017-10-13 LAB — TROPONIN I
Troponin I: <0.03 ng/mL (ref <0.03)
Troponin I: <0.03 ng/mL (ref <0.03)
Troponin I: <0.03 ng/mL (ref <0.03)

## 2017-10-13 LAB — C DIFFICILE QUICK SCREEN W PCR REFLEX
C DIFFICILE (CDIFF) TOXIN: NEGATIVE
C DIFFICLE (CDIFF) ANTIGEN: POSITIVE — AB

## 2017-10-13 LAB — CLOSTRIDIUM DIFFICILE BY PCR, REFLEXED: Toxigenic C. Difficile by PCR: POSITIVE — AB

## 2017-10-13 LAB — LIPASE, BLOOD: Lipase: 35 U/L (ref 11–51)

## 2017-10-13 MED ORDER — ONDANSETRON HCL 4 MG PO TABS: 4.0000 mg | ORAL_TABLET | Freq: Four times a day (QID) | ORAL | Status: DC | PRN

## 2017-10-13 MED ORDER — HEPARIN SODIUM (PORCINE) 1000 UNIT/ML DIALYSIS: 1000.0000 [IU] | INTRAMUSCULAR | Status: DC | PRN

## 2017-10-13 MED ORDER — METHOCARBAMOL 1000 MG/10ML IJ SOLN
500.0000 mg | Freq: Four times a day (QID) | INTRAVENOUS | Status: DC | PRN
  Administered 2017-10-13 (×2): 500 mg via INTRAVENOUS
  Filled 2017-10-13: qty 5
  Filled 2017-10-13: qty 550
  Filled 2017-10-13: qty 5

## 2017-10-13 MED ORDER — SODIUM CHLORIDE 0.9 % IV BOLUS
1000.0000 mL | Freq: Once | INTRAVENOUS | Status: AC
  Administered 2017-10-13: 1000 mL via INTRAVENOUS

## 2017-10-13 MED ORDER — FENTANYL CITRATE (PF) 100 MCG/2ML IJ SOLN
50.0000 ug | Freq: Once | INTRAMUSCULAR | Status: AC
Start: 2017-10-13 — End: 2017-10-13
  Administered 2017-10-13: 50 ug via INTRAVENOUS
  Filled 2017-10-13: qty 2

## 2017-10-13 MED ORDER — ZOLPIDEM TARTRATE 5 MG PO TABS: 5.0000 mg | ORAL_TABLET | Freq: Every evening | ORAL | Status: DC | PRN

## 2017-10-13 MED ORDER — BUPROPION HCL 75 MG PO TABS
37.5000 mg | ORAL_TABLET | Freq: Two times a day (BID) | ORAL | Status: DC
  Administered 2017-10-13 – 2017-10-15 (×5): 37.5 mg via ORAL
  Filled 2017-10-13 (×7): qty 0.5

## 2017-10-13 MED ORDER — LACTATED RINGERS IV BOLUS: 250.0000 mL | INTRAVENOUS | Status: DC | PRN

## 2017-10-13 MED ORDER — SODIUM CHLORIDE 0.9 % IV SOLN: 100.0000 mL | INTRAVENOUS | Status: DC | PRN

## 2017-10-13 MED ORDER — ONDANSETRON HCL 4 MG/2ML IJ SOLN: 4.0000 mg | Freq: Four times a day (QID) | INTRAMUSCULAR | Status: DC | PRN

## 2017-10-13 MED ORDER — ENOXAPARIN SODIUM 30 MG/0.3ML ~~LOC~~ SOLN
30.0000 mg | SUBCUTANEOUS | Status: DC
  Administered 2017-10-13 – 2017-10-14 (×2): 30 mg via SUBCUTANEOUS
  Filled 2017-10-13 (×2): qty 0.3

## 2017-10-13 MED ORDER — RILPIVIRINE HCL 25 MG PO TABS
25.0000 mg | ORAL_TABLET | Freq: Every day | ORAL | Status: DC
  Administered 2017-10-13 – 2017-10-14 (×2): 25 mg via ORAL
  Filled 2017-10-13 (×2): qty 1

## 2017-10-13 MED ORDER — DIPHENHYDRAMINE HCL 50 MG/ML IJ SOLN
25.0000 mg | Freq: Once | INTRAMUSCULAR | Status: AC
  Administered 2017-10-13: 25 mg via INTRAVENOUS
  Filled 2017-10-13: qty 1

## 2017-10-13 MED ORDER — DOCUSATE SODIUM 283 MG RE ENEM
1.0000 | ENEMA | RECTAL | Status: DC | PRN
  Filled 2017-10-13: qty 1

## 2017-10-13 MED ORDER — ALBUTEROL SULFATE (2.5 MG/3ML) 0.083% IN NEBU: 2.5000 mg | INHALATION_SOLUTION | Freq: Four times a day (QID) | RESPIRATORY_TRACT | Status: DC | PRN

## 2017-10-13 MED ORDER — PENTAFLUOROPROP-TETRAFLUOROETH EX AERO: 1.0000 "application " | INHALATION_SPRAY | CUTANEOUS | Status: DC | PRN

## 2017-10-13 MED ORDER — SORBITOL 70 % SOLN
30.0000 mL | Status: DC | PRN
  Filled 2017-10-13: qty 30

## 2017-10-13 MED ORDER — SODIUM CHLORIDE 0.9% FLUSH
3.0000 mL | Freq: Two times a day (BID) | INTRAVENOUS | Status: DC
  Administered 2017-10-13 – 2017-10-14 (×3): 3 mL via INTRAVENOUS

## 2017-10-13 MED ORDER — LIDOCAINE HCL (PF) 1 % IJ SOLN: 5.0000 mL | INTRAMUSCULAR | Status: DC | PRN

## 2017-10-13 MED ORDER — PRAVASTATIN SODIUM 40 MG PO TABS
40.0000 mg | ORAL_TABLET | Freq: Every evening | ORAL | Status: DC
  Administered 2017-10-14: 40 mg via ORAL
  Filled 2017-10-13: qty 1

## 2017-10-13 MED ORDER — LOPERAMIDE HCL 2 MG PO CAPS
4.0000 mg | ORAL_CAPSULE | Freq: Once | ORAL | Status: AC
  Administered 2017-10-13: 4 mg via ORAL
  Filled 2017-10-13: qty 2

## 2017-10-13 MED ORDER — ALTEPLASE 2 MG IJ SOLR: 2.0000 mg | Freq: Once | INTRAMUSCULAR | Status: DC | PRN

## 2017-10-13 MED ORDER — NEPRO/CARBSTEADY PO LIQD
237.0000 mL | Freq: Three times a day (TID) | ORAL | Status: DC | PRN
  Filled 2017-10-13: qty 237

## 2017-10-13 MED ORDER — IOPAMIDOL (ISOVUE-300) INJECTION 61%
INTRAVENOUS | Status: AC
  Administered 2017-10-13: 100 mL
  Filled 2017-10-13: qty 100

## 2017-10-13 MED ORDER — ZIDOVUDINE 100 MG PO CAPS
300.0000 mg | ORAL_CAPSULE | Freq: Every day | ORAL | Status: DC
  Administered 2017-10-13 – 2017-10-14 (×2): 300 mg via ORAL
  Filled 2017-10-13 (×2): qty 3

## 2017-10-13 MED ORDER — DOLUTEGRAVIR SODIUM 50 MG PO TABS
50.0000 mg | ORAL_TABLET | Freq: Every day | ORAL | Status: DC
  Administered 2017-10-13 – 2017-10-14 (×2): 50 mg via ORAL
  Filled 2017-10-13 (×2): qty 1

## 2017-10-13 MED ORDER — FENTANYL CITRATE (PF) 100 MCG/2ML IJ SOLN
50.0000 ug | Freq: Once | INTRAMUSCULAR | Status: AC
  Administered 2017-10-13: 50 ug via INTRAVENOUS
  Filled 2017-10-13: qty 2

## 2017-10-13 MED ORDER — ALBUTEROL SULFATE HFA 108 (90 BASE) MCG/ACT IN AERS: 2.0000 | INHALATION_SPRAY | RESPIRATORY_TRACT | Status: DC | PRN

## 2017-10-13 MED ORDER — FERRIC CITRATE 1 GM 210 MG(FE) PO TABS
420.0000 mg | ORAL_TABLET | Freq: Two times a day (BID) | ORAL | Status: DC
  Administered 2017-10-13 – 2017-10-15 (×4): 420 mg via ORAL
  Filled 2017-10-13 (×5): qty 2

## 2017-10-13 MED ORDER — LIDOCAINE-PRILOCAINE 2.5-2.5 % EX CREA: 1.0000 "application " | TOPICAL_CREAM | CUTANEOUS | Status: DC | PRN

## 2017-10-13 MED ORDER — ACETAMINOPHEN 650 MG RE SUPP: 650.0000 mg | Freq: Four times a day (QID) | RECTAL | Status: DC | PRN

## 2017-10-13 MED ORDER — CALCIUM CARBONATE ANTACID 1250 MG/5ML PO SUSP
500.0000 mg | Freq: Four times a day (QID) | ORAL | Status: DC | PRN
  Filled 2017-10-13: qty 5

## 2017-10-13 MED ORDER — MONTELUKAST SODIUM 10 MG PO TABS
10.0000 mg | ORAL_TABLET | Freq: Every day | ORAL | Status: DC
  Administered 2017-10-13 – 2017-10-14 (×2): 10 mg via ORAL
  Filled 2017-10-13 (×2): qty 1

## 2017-10-13 MED ORDER — ALBUTEROL SULFATE (2.5 MG/3ML) 0.083% IN NEBU: 5.0000 mg | INHALATION_SOLUTION | Freq: Once | RESPIRATORY_TRACT | Status: DC

## 2017-10-13 MED ORDER — ACETAMINOPHEN 325 MG PO TABS: 650.0000 mg | ORAL_TABLET | Freq: Four times a day (QID) | ORAL | Status: DC | PRN

## 2017-10-13 MED ORDER — CAMPHOR-MENTHOL 0.5-0.5 % EX LOTN
1.0000 "application " | TOPICAL_LOTION | Freq: Three times a day (TID) | CUTANEOUS | Status: DC | PRN
  Filled 2017-10-13: qty 222

## 2017-10-13 MED ORDER — HYDROXYZINE HCL 25 MG PO TABS
25.0000 mg | ORAL_TABLET | Freq: Three times a day (TID) | ORAL | Status: DC | PRN
Start: 2017-10-13 — End: 2017-10-15

## 2017-10-13 MED ORDER — METOCLOPRAMIDE HCL 5 MG/ML IJ SOLN
10.0000 mg | Freq: Once | INTRAMUSCULAR | Status: AC
  Administered 2017-10-13: 10 mg via INTRAVENOUS
  Filled 2017-10-13: qty 2

## 2017-10-13 MED ORDER — DOLUTEGRAVIR-RILPIVIRINE 50-25 MG PO TABS: 1.0000 | ORAL_TABLET | Freq: Every day | ORAL | Status: DC

## 2017-10-13 MED ORDER — ISOSORBIDE DINITRATE 30 MG PO TABS
30.0000 mg | ORAL_TABLET | Freq: Three times a day (TID) | ORAL | Status: DC
  Administered 2017-10-13: 30 mg via ORAL
  Filled 2017-10-13 (×3): qty 1

## 2017-10-13 NOTE — ED Triage Notes (Signed)
Per EMS, pt coming from home with complaints of dizziness and SOB. Pt stated he has been having N/V/D and lower back cramping since he left dialysis this afternoon. Pt BP on scene was 78/44 and O2 97% on r.o. Pt is not having CP.

## 2017-10-13 NOTE — ED Provider Notes (Signed)
[facility] EMERGENCY DEPARTMENT Provider Note   CSN: [id number] Arrival date & time:        History   Chief Complaint Chief Complaint  Patient presents with  . Dizziness  . Shortness of Breath    HPI Darrell Johnson is a 52 y.o. male.  The history is provided by the patient.  Dizziness  Associated symptoms: shortness of breath   Shortness of Breath   He has history of hypertension, CHF, end-stage renal disease on hemodialysis, gout, HIV infection, asthma, seizure disorder and is brought in by ambulance because of vomiting and diarrhea.  He had his normal dialysis session today, but developed vomiting and diarrhea when he got home.  He has had multiple episodes of emesis and diarrhea.  There is been no blood or mucus in stool or emesis.  He denies fever, chills, sweats.  He is complaining of generalized abdominal pain which he rates at 9/10.  There is no prior history of abdominal surgery.  He denies any sick contacts or unusual food exposure.  Past Medical History:  Diagnosis Date  . Anemia   . Anxiety   . Arthritis    "knees" (04/13/2017)  . Asthma   . CHF (congestive heart failure) ([place])   . Chronic kidney disease    stage 3-4 CKD, followed by Dr. Moshe Cipro  . ESRD (end stage renal disease) ([place])    ESRD due to HTN started dialysis Feb 2016  . ESRD (end stage renal disease) on dialysis [facility])    "MWF; [facility]" (04/13/2017)  . Gout, unspecified 08/13/2009   Qualifier: Diagnosis of  By: Redmond Pulling  MD, Mateo Flow    . H1N1 influenza    March 2016  . History of blood transfusion 02/2017   "related to OR"  . History of syphilis   . HIV infection ([place]) 1980's   on ART therapy since, followed by ID clinic, complicated  by neuropathy  . HTN (hypertension)   . Hyperlipidemia    hypertrygliceridemia determined ti be secondary to ART therpay  . Hypertension   . HYPERTENSION 05/08/2006  . 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 Pneumonia    "once; years ago"  (04/13/2017)  . Rib fractures 01/2009  . Seizure disorder ([place])   . Seizures ([place])    last seizure was in the 1990s, pt has family history of seizures; "probably related to alcohol" (04/13/2017)  . 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 Syphilis 1997   history of syphilis 1997  . Tobacco abuse     Patient Active Problem List   Diagnosis Date Noted  . Moderate mitral regurgitation   . Acute exacerbation of CHF (congestive heart failure) ([place]) 04/12/2017  . Acute respiratory failure with hypoxia ([place]) 03/29/2017  . Scrotal abscess 03/09/2017  . Trigger finger of right hand 09/11/2016  . Abscess of groin, right 09/04/2016  . Hyperlipidemia 03/31/2016  . Anxiety state 03/11/2016  . ESRD (end stage renal disease) ([place]) 01/28/2016  . Lumbar radiculopathy 01/28/2016  . Seasonal allergies   . Healthcare maintenance 05/03/2015  . Anemia in chronic kidney disease 08/30/2014  . ESRD on dialysis ([place]) 12/27/2013  . Drug noncompliance 11/07/2013  . History of syphilis 11/07/2013  . Arthritis 09/09/2013  . Tobacco abuse 09/07/2013  . Flash pulmonary edema ([place]) 06/11/2013  . Chronic pain disorder 04/28/2013  . HYPERTRIGLYCERIDEMIA  11/01/2009  . Gout 08/13/2009  . ERECTILE DYSFUNCTION 06/08/2008  . HIV disease ([place]) 05/08/2006  . PERIPHERAL NEUROPATHY 05/08/2006  . Essential hypertension 05/08/2006  . SEIZURE DISORDER 05/08/2006    Past Surgical History:  Procedure Laterality Date  . AV FISTULA PLACEMENT Right 12/02/2013   Procedure: ARTERIOVENOUS (AV) FISTULA CREATION;  Surgeon: Rosetta Posner, MD;  Location: [facility];  Service: Vascular;  Laterality: Right;  . INCISION AND DRAINAGE ABSCESS Right 03/09/2017   Procedure: INCISION AND DRAINAGE Right Scrotal Abscess;  Surgeon: Franchot Gallo, MD;  Location: [facility];  Service: Urology;  Laterality:  Right;  . THROMBECTOMY Right ~ 2016   "AV fistula clotted off"        Home Medications    Prior to Admission medications   Medication Sig Start Date End Date Taking? Authorizing Provider  acetaminophen (TYLENOL) 500 MG tablet Take 1,000 mg by mouth every 6 (six) hours as needed for mild pain.    [provider]  albuterol (PROVENTIL HFA;VENTOLIN HFA) 108 (90 Base) MCG/ACT inhaler Inhale 2 puffs into the lungs every 4 (four) hours as needed for wheezing or shortness of breath. 05/12/17 07/10/17  Clent Demark, PA-C  ALL-IN-ONE NEBULIZER SYSTEM MISC 1 each by Does not apply route daily as needed. 09/15/17   Clent Demark, PA-C  amLODipine (NORVASC) 10 MG tablet Take 1 tablet (10 mg total) by mouth daily. 07/28/17   Clent Demark, PA-C  amoxicillin-clavulanate (AUGMENTIN) 500-125 MG tablet Take 1 tablet (500 mg total) by mouth 2 (two) times daily. 09/10/17   Clent Demark, PA-C  Blood Pressure Monitor DEVI 1 each by Does not apply route daily. 08/27/17   Clent Demark, PA-C  buPROPion [facility]) 75 MG tablet TAKE 1/2 TABLET TWICE A DAY Patient taking differently: TAKE 37.5mg  by mouth TWICE A DAY 04/16/17   Axel Filler, MD  carvedilol (COREG) 25 MG tablet Take 1 tablet (25 mg total) by mouth 2 (two) times daily with a meal. 07/28/17   Clent Demark, PA-C  cloNIDine (CATAPRES) 0.1 MG tablet Take one tablet if blood pressure is 180/110 mmHg or greater. May take one pill up to twice per day. 08/27/17   Clent Demark, PA-C  cyclobenzaprine (FLEXERIL) 10 MG tablet Take 1 tablet (10 mg total) by mouth at bedtime. 07/28/17   Clent Demark, PA-C  cyclobenzaprine (FLEXERIL) 10 MG tablet Take 1 tablet (10 mg total) by mouth 3 (three) times daily as needed for muscle spasms. 10/06/17   Carlyle Basques, MD  Dolutegravir-Rilpivirine (JULUCA) 50-25 MG TABS Take 1 tablet by mouth daily with supper. 08/27/17   Carlyle Basques, MD  doxazosin (CARDURA) 8 MG tablet  Take 1 tablet (8 mg total) by mouth at bedtime. 07/28/17   Clent Demark, PA-C  ferric citrate (AURYXIA) 1 GM 210 MG(Fe) tablet Take 420 mg by mouth 2 (two) times daily with a meal.     [provider]  furosemide (LASIX) 40 MG tablet Take 1 tablet (40 mg total) by mouth as needed for fluid or edema. 07/28/17   Clent Demark, PA-C  hydrALAZINE (APRESOLINE) 100 MG tablet Take 0.5 tablets (50 mg total) by mouth 3 (three) times daily. 07/28/17   Clent Demark, PA-C  isosorbide dinitrate (ISORDIL) 30 MG tablet Take 1 tablet (30 mg total) by mouth 3 (three) times daily. 07/28/17   Clent Demark, PA-C  lisinopril (PRINIVIL,ZESTRIL) 10 MG tablet TAKE 1 TABLET BY MOUTH EVERYDAY AT BEDTIME 07/28/17  Clent Demark, PA-C  montelukast (SINGULAIR) 10 MG tablet Take 1 tablet (10 mg total) by mouth at bedtime. 12/17/16   Jule Ser, DO  phenylephrine (NEO-SYNEPHRINE) 1 % nasal spray Place 1 drop into both nostrils every 6 (six) hours as needed for congestion. 09/10/17   Clent Demark, PA-C  pravastatin (PRAVACHOL) 40 MG tablet TAKE 1 TABLET (40 MG TOTAL) BY MOUTH EVERY EVENING. 01/27/17 01/27/18  Jule Ser, DO  zidovudine (RETROVIR) 300 MG tablet Take 1 tablet (300 mg total) by mouth daily with supper. 08/27/17   Carlyle Basques, MD    Family History Family History  Problem Relation Age of Onset  . Cancer Mother   . Hypertension Mother   . COPD Father   . Hypertension Father   . Diabetes Sister   . Hypertension Sister   . Diabetes Brother   . Hypertension Brother   . Stroke Neg Hx     Social History Social History   Tobacco Use  . Smoking status: Light Tobacco Smoker    Packs/day: 0.10    Years: 25.00    Pack years: 2.50    Types: Cigarettes    Start date: 01/10/2016    Last attempt to quit: 04/07/2016    Years since quitting: 1.5  . Smokeless tobacco: Never Used  . Tobacco comment: 2 cigs a day  Substance Use Topics  . Alcohol use: No    Alcohol/week: 0.0  oz    Comment: 04/13/2017 "quit ~ 2014"  . Drug use: No     Allergies   Patient has no known allergies.   Review of Systems Review of Systems  Respiratory: Positive for shortness of breath.   Neurological: Positive for dizziness.  All other systems reviewed and are negative.    Physical Exam Updated Vital Signs BP (!) 75/52   Pulse 100   Temp 99.5 F (37.5 C) (Oral)   Resp 20   Ht 5\' 9"  (1.753 m)   Wt 112 kg (247 lb)   SpO2 97%   BMI 36.48 kg/m   Physical Exam  Nursing note and vitals reviewed.  52 year old male, resting comfortably and in no acute distress. Vital signs are significant for hypotension. Oxygen saturation is 97%, which is normal. Head is normocephalic and atraumatic. PERRLA, EOMI. Oropharynx is clear. Neck is nontender and supple without adenopathy or JVD. Back is nontender and there is no CVA tenderness. Lungs are clear without rales, wheezes, or rhonchi. Chest is nontender. Heart has regular rate and rhythm without murmur. Abdomen is soft, flat, with moderate tenderness diffusely.  There is no rebound or guarding.  There are no masses or hepatosplenomegaly and peristalsis is hypoactive. Extremities have no cyanosis or edema, full range of motion is present.  AV fistula is present in the right forearm with thrill present. Skin is warm and dry without rash. Neurologic: Mental status is normal, cranial nerves are intact, there are no motor or sensory deficits.  ED Treatments / Results  Labs (all labs ordered are listed, but only abnormal results are displayed) Labs Reviewed - No data to display  EKG EKG Interpretation  Date/Time:  Tuesday October 13 2017 02:54:43 EDT Ventricular Rate:  98 PR Interval:    QRS Duration: 90 QT Interval:  364 QTC Calculation: 465 R Axis:   89 Text Interpretation:  Sinus rhythm Borderline short PR interval Probable left atrial enlargement Anterior infarct, possibly acute Lateral leads are also involved When compared  with ECG of 08/16/2017, ST elevation  in Anterior leads is now present Confirmed by Delora Fuel ([id number]) on 10/13/2017 3:04:46 AM   Radiology Dg Chest Port 1 View  Result Date: 10/13/2017 CLINICAL DATA:  Shortness of breath EXAM: PORTABLE CHEST 1 VIEW COMPARISON:  Chest radiograph 08/16/2017 FINDINGS: Mild cardiomegaly with mild interstitial opacity. No pleural effusion or pneumothorax. No focal consolidation. IMPRESSION: Mild cardiomegaly and mild interstitial pulmonary edema. Electronically Signed   By: Ulyses Jarred M.D.   On: 10/13/2017 04:04    Procedures Procedures  CRITICAL CARE Performed by: Delora Fuel Total critical care time: 120 minutes Critical care time was exclusive of separately billable procedures and treating other patients. Critical care was necessary to treat or prevent imminent or life-threatening deterioration. Critical care was time spent personally by me on the following activities: development of treatment plan with patient and/or surrogate as well as nursing, discussions with consultants, evaluation of patient's response to treatment, examination of patient, obtaining history from patient or surrogate, ordering and performing treatments and interventions, ordering and review of laboratory studies, ordering and review of radiographic studies, pulse oximetry and re-evaluation of patient's condition.  Medications Ordered in ED Medications  albuterol (PROVENTIL) (2.5 MG/3ML) 0.083% nebulizer solution 5 mg (has no administration in time range)     Initial Impression / Assessment and Plan / ED Course  I have reviewed the triage vital signs and the nursing notes.  Pertinent labs & imaging results that were available during my care of the patient were reviewed by me and considered in my medical decision making (see chart for details).  Vomiting and diarrhea-probably viral gastroenteritis.  Doubt bowel obstruction, pancreatitis, diverticulitis.  Hypotension which is because  of volume depletion with vomiting and diarrhea coming on the heels of dialysis session.  He has been given IV hydration.  He did receive ondansetron in the ambulance with slight improvement.  He will be given metoclopramide and loperamide.  Of note, ECG has ST segment elevation in anterior leads that would be suggestive of STEMI, but he has no symptoms suggestive of STEMI.  He was given 1 L of saline, but continued to be hypotensive.  Given a second liter of saline, and blood pressure came up to an adequate level.  Nausea is improved with ondansetron.  He continued to complain of abdominal pain, and reexam shows continued abdominal tenderness in a generalized pattern.  CT of abdomen and pelvis is ordered, results pending.  He will need to be admitted for ongoing hydration and monitoring of vital signs.  Case is signed out to Dr. Tomi Bamberger to evaluate CT results and arrange for hospital admission.  Final Clinical Impressions(s) / ED Diagnoses   Final diagnoses:  Hypotension due to hypovolemia  Nausea vomiting and diarrhea  End-stage renal disease on hemodialysis [facility])  Abdominal pain, generalized    ED Discharge Orders    None       Delora Fuel, MD 16/07/37 [phone]

## 2017-10-13 NOTE — ED Notes (Signed)
ED Provider at bedside. 

## 2017-10-13 NOTE — ED Notes (Signed)
Clear Liquid Lunch Ordered @ 1200-per RN-called by Levada Dy

## 2017-10-13 NOTE — ED Notes (Signed)
Clear Liquid Diet Ordered for Dinner.

## 2017-10-13 NOTE — ED Provider Notes (Signed)
Pt seen earlier by Dr Roxanne Mins.  Please see his note.  Plan was to follow up CT scan.  Results as listed below.  No acute surgical process.  Probable enteritis.  Plan on admission considering his profound hypotension earlier  Ct Abdomen Pelvis W Contrast  Result Date: 10/13/2017 CLINICAL DATA:  Nausea and vomiting.  Renal failure. EXAM: CT ABDOMEN AND PELVIS WITH CONTRAST TECHNIQUE: Multidetector CT imaging of the abdomen and pelvis was performed using the standard protocol following bolus administration of intravenous contrast. CONTRAST:  117mL ISOVUE-300 IOPAMIDOL (ISOVUE-300) INJECTION 61% COMPARISON:  CT abdomen and pelvis September 29, 2014 and CT pelvis March 09, 2017 FINDINGS: Lower chest: There is patchy atelectasis in the right middle lobe and left base regions. There is parietal pleural calcification on the right posteriorly. There is a small hiatal hernia. Hepatobiliary: No focal liver lesions are appreciable. Gallbladder wall is not appreciably thickened. There is no biliary duct dilatation. Pancreas: No pancreatic mass or inflammatory focus. Spleen: The spleen measures 14.3 x 13.8 x 8.9 cm with a measured splenic volume of 878 cubic cm. No focal splenic lesions evident. Adrenals/Urinary Tract: Adrenals bilaterally appear normal. Kidneys are small bilaterally consistent with known chronic renal failure. There is no appreciable renal mass or hydronephrosis on either side. There is no renal or ureteral calculus on either side. Urinary bladder is midline with wall thickness within normal limits. Urinary bladder is virtually decompressed. Stomach/Bowel: There is fluid throughout most of the loops of the small bowel. There is no appreciable bowel wall or mesenteric thickening. There is no evident bowel obstruction. No free air or portal venous air. Vascular/Lymphatic: There is atherosclerotic calcification in the aorta and common iliac arteries. There is also common femoral artery calcification bilaterally. No  aneurysm evident. Major mesenteric vessels appear patent. No adenopathy is appreciable in the abdomen or pelvis. Reproductive: Prostate and seminal vesicles are normal in size and contour. No pelvic mass evident. Other: The appendix appears normal. There is no ascites or abscess evident in the abdomen or pelvis. There is a small ventral hernia containing only fat. Musculoskeletal: There are no blastic or lytic bone lesions. There old rib fractures on the right posteriorly. There is degenerative change in the left hip joint. No intramuscular or abdominal wall lesions are evident. IMPRESSION: 1. There is fluid in most small bowel loops. This finding may be seen normally but also may represent early ileus or enteritis. No bowel obstruction evident. No abscess. Appendix appears normal. 2. Kidney somewhat atrophic consistent with chronic renal failure. No renal or ureteral calculus. No hydronephrosis. 3. Splenic enlargement. Etiology uncertain. No focal splenic lesions evident. 4.  Small hiatal hernia.  Small ventral hernia containing only fat. 5. Aortoiliac and common femoral artery atherosclerosis. No aneurysm. 6. 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 Aortic Atherosclerosis (ICD10-I70.0). Electronically Signed   By: Lowella Grip III M.D.   On: 10/13/2017 08:42   Dg Chest Port 1 View  Result Date: 10/13/2017 CLINICAL DATA:  Shortness of breath EXAM: PORTABLE CHEST 1 VIEW COMPARISON:  Chest radiograph 08/16/2017 FINDINGS: Mild cardiomegaly with mild interstitial opacity. No pleural effusion or pneumothorax. No focal consolidation. IMPRESSION: Mild cardiomegaly and mild interstitial pulmonary edema. Electronically Signed   By: Ulyses Jarred M.D.   On: 10/13/2017 04:04      Dorie Rank, MD 10/13/17 [phone]

## 2017-10-13 NOTE — ED Notes (Signed)
Provided patient with ginger ale. Tolerating well.

## 2017-10-13 NOTE — Consult Note (Addendum)
[facility] Renal Consultation Note    Indication for Consultation:  Management of ESRD/hemodialysis, anemia, hypertension/volume, and secondary hyperparathyroidism. PCP:  HPI: Darrell Johnson is a 52 y.o. male with ESRD, HTN, HIV, hyperlipidemia, Hx seizure d/o who was admitted with hypotension in setting of ?viral gastroenteritis.   Pt reports that he was in his usual state of health until yesterday. After HD, he developed acute nausea, vomiting, and diarrhea and lower back cramping, then developed dizziness and SOB. No fever or chills. No sick contacts or suspicious food eaten. Called EMS and found to be hypotensive on evaluation (BP syst 70's). In ED, developed generalized abdominal pain. Labs showed Na 133, K 5.3, WBC 9.4, Hgb 14.3, troponin < 0.03. CXR showed ?mild interstitial edema. EKG with ST elevation in V2-3 only. Abd CT with possible enteritis, no other acute findings. He was given 2L IVF for the hypotension. C.diff testing pending. Dizziness and SOB improved now. Remains afebrile, no CP.  Dialyzes MWF at [facility] (new HD center). Last dialysis was 3/25 which he completed in entirety. Has not been reaching EDW recently, left approx 2kg up from last HD (BP did drop with HD).  Past Medical History:  Diagnosis Date  . Anemia   . Anxiety   . Arthritis    "knees" (04/13/2017)  . Asthma   . CHF (congestive heart failure) ([place])   . ESRD (end stage renal disease) on dialysis [facility])    "MWF; [facility]" (04/13/2017)  . Gout, unspecified 08/13/2009   Qualifier: Diagnosis of  By: Redmond Pulling  MD, Mateo Flow    . H1N1 influenza    March 2016  . History of blood transfusion 02/2017   "related to OR"  . HIV infection ([place]) 1980's   on ART therapy since, followed by ID clinic, complicated  by neuropathy  . Hyperlipidemia    hypertrygliceridemia determined ti be secondary to ART therpay  . HYPERTENSION 05/08/2006  . Pneumonia    "once; years ago" (04/13/2017)  . Rib fractures  01/2009  . Seizures ([place])    last seizure was in the 1990s, pt has family history of seizures; "probably related to alcohol" (04/13/2017)  . 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 Syphilis 1997   history of syphilis 1997  . Tobacco abuse    Past Surgical History:  Procedure Laterality Date  . AV FISTULA PLACEMENT Right 12/02/2013   Procedure: ARTERIOVENOUS (AV) FISTULA CREATION;  Surgeon: Rosetta Posner, MD;  Location: [place];  Service: Vascular;  Laterality: Right;  . INCISION AND DRAINAGE ABSCESS Right 03/09/2017   Procedure: INCISION AND DRAINAGE Right Scrotal Abscess;  Surgeon: Franchot Gallo, MD;  Location: [place];  Service: Urology;  Laterality: Right;  . THROMBECTOMY Right ~ 2016   "AV fistula clotted off"   Family History  Problem Relation Age of Onset  . Cancer Mother   . Hypertension Mother   . COPD Father   . Hypertension Father   . Diabetes Sister   . Hypertension Sister   . Diabetes Brother   . Hypertension Brother   . Stroke Neg Hx    Social History:  reports that he quit smoking about 84 years ago. His smoking use included cigarettes. He has a 2.50 pack-year smoking history. He has never used smokeless tobacco. He reports that he does not drink alcohol or use drugs.  ROS: As per HPI otherwise negative.  Physical Exam: Vitals:   10/13/17 1045 10/13/17 1100 10/13/17 1130 10/13/17 1200  BP: 101/62 (!) 106/58 107/64 109/71  Pulse: 83 82 84 89  Resp: (!) 23   (!) 24  Temp:      TempSrc:      SpO2: 95% 98% 100% 100%  Weight:      Height:         General: Well developed, well nourished, in no acute distress. Wearing nasal oxygen. Head: Normocephalic, atraumatic, sclera non-icteric, mucus membranes are moist. Neck: Supple without lymphadenopathy/masses. JVD not elevated. Lungs: Clear bilaterally to auscultation without  wheezes, rales, or rhonchi. Breathing is unlabored. Heart: RRR with normal S1, S2. No murmurs, rubs, or gallops appreciated. Abdomen: Soft, mild generalized tenderness without guarding. Musculoskeletal:  Strength and tone appear normal for age. Lower extremities: No edema or ischemic changes, no open wounds. Neuro: Alert and oriented X 3. Moves all extremities spontaneously. Psych:  Responds to questions appropriately with a normal affect. Dialysis Access: AVG + bruit  No Known Allergies Prior to Admission medications   Medication Sig Start Date End Date Taking? Authorizing Provider  albuterol (PROVENTIL HFA;VENTOLIN HFA) 108 (90 Base) MCG/ACT inhaler Inhale 2 puffs into the lungs every 4 (four) hours as needed for wheezing or shortness of breath. 05/12/17 10/13/24 Yes Clent Demark, PA-C  amLODipine (NORVASC) 10 MG tablet Take 1 tablet (10 mg total) by mouth daily. 07/28/17  Yes Clent Demark, PA-C  buPROPion [facility]) 75 MG tablet TAKE 1/2 TABLET TWICE A DAY Patient taking differently: TAKE 37.5mg  by mouth TWICE A DAY 04/16/17  Yes Axel Filler, MD  carvedilol (COREG) 25 MG tablet Take 1 tablet (25 mg total) by mouth 2 (two) times daily with a meal. 07/28/17  Yes Clent Demark, PA-C  cloNIDine (CATAPRES) 0.1 MG tablet Take one tablet if blood pressure is 180/110 mmHg or greater. May take one pill up to twice per day. Patient taking differently: Take 0.1 mg by mouth See admin instructions. Take one tablet if blood pressure is 180/110 mmHg or greater. May take one pill up to twice per day. 08/27/17  Yes Clent Demark, PA-C  cyclobenzaprine (FLEXERIL) 10 MG tablet Take 1 tablet (10 mg total) by mouth 3 (three) times daily as needed for muscle spasms. 10/06/17  Yes Carlyle Basques, MD  Dolutegravir-Rilpivirine (JULUCA) 50-25 MG TABS Take 1 tablet by mouth daily with supper. 08/27/17  Yes Carlyle Basques, MD  doxazosin (CARDURA) 8 MG tablet Take 1 tablet (8 mg total) by mouth  at bedtime. 07/28/17  Yes Clent Demark, PA-C  ferric citrate (AURYXIA) 1 GM 210 MG(Fe) tablet Take 420 mg by mouth 2 (two) times daily with a meal.    Yes [provider]  furosemide (LASIX) 40 MG tablet Take 1 tablet (40 mg total) by mouth as needed for fluid or edema. 07/28/17  Yes Clent Demark, PA-C  hydrALAZINE (APRESOLINE) 100 MG tablet Take 0.5 tablets (50 mg total) by mouth 3 (three) times daily. 07/28/17  Yes Clent Demark, PA-C  isosorbide dinitrate (ISORDIL) 30 MG tablet Take 1 tablet (30 mg total) by mouth 3 (three) times daily. 07/28/17  Yes Clent Demark, PA-C  lisinopril (PRINIVIL,ZESTRIL) 10 MG tablet TAKE 1 TABLET BY MOUTH EVERYDAY AT BEDTIME 07/28/17  Yes Clent Demark, PA-C  montelukast (SINGULAIR) 10 MG tablet Take 1 tablet (10 mg total) by mouth at bedtime. 12/17/16  Yes Jule Ser, DO  phenylephrine (NEO-SYNEPHRINE) 1 % nasal spray Place 1 drop into both nostrils every 6 (six) hours as needed for congestion. 09/10/17  Yes Clent Demark, PA-C  pravastatin (PRAVACHOL) 40 MG tablet TAKE 1 TABLET (40 MG TOTAL) BY MOUTH EVERY EVENING. 01/27/17 01/27/18 Yes Jule Ser, DO  zidovudine (RETROVIR) 300 MG tablet Take 1 tablet (300 mg total) by mouth daily with supper. 08/27/17  Yes Carlyle Basques, MD  ALL-IN-ONE NEBULIZER SYSTEM MISC 1 each by Does not apply route daily as needed. 09/15/17   Clent Demark, PA-C  Blood Pressure Monitor DEVI 1 each by Does not apply route daily. 08/27/17   Clent Demark, PA-C   Current Facility-Administered Medications  Medication Dose Route Frequency Provider Last Rate Last Dose  . acetaminophen (TYLENOL) tablet 650 mg  650 mg Oral Q6H PRN Karmen Bongo, MD       Or  . acetaminophen (TYLENOL) suppository 650 mg  650 mg Rectal Q6H PRN Karmen Bongo, MD      . albuterol (PROVENTIL HFA;VENTOLIN HFA) 108 (90 Base) MCG/ACT inhaler 2 puff  2 puff Inhalation Q4H PRN Karmen Bongo, MD      . buPROPion [facility])  tablet 37.5 mg  37.5 mg Oral BID Karmen Bongo, MD      . calcium carbonate (dosed in mg elemental calcium) suspension 500 mg of elemental calcium  500 mg of elemental calcium Oral Q6H PRN Karmen Bongo, MD      . camphor-menthol [facility]) lotion 1 application  1 application Topical Y0V PRN Karmen Bongo, MD       And  . hydrOXYzine (ATARAX/VISTARIL) tablet 25 mg  25 mg Oral Q8H PRN Karmen Bongo, MD      . docusate sodium [facility]) enema 283 mg  1 enema Rectal PRN Karmen Bongo, MD      . Dolutegravir-Rilpivirine 50-25 MG TABS 1 tablet  1 tablet Oral Q supper Karmen Bongo, MD      . enoxaparin (LOVENOX) injection 30 mg  30 mg Subcutaneous Q24H Karmen Bongo, MD      . feeding supplement (NEPRO CARB STEADY) liquid 237 mL  237 mL Oral TID PRN Karmen Bongo, MD      . ferric citrate (AURYXIA) tablet 420 mg  420 mg Oral BID WC Karmen Bongo, MD      . isosorbide dinitrate (ISORDIL) tablet 30 mg  30 mg Oral TID Karmen Bongo, MD      . lactated ringers bolus 250 mL  250 mL Intravenous Q1H PRN Karmen Bongo, MD      . methocarbamol (ROBAXIN) 500 mg in dextrose 5 % 50 mL IVPB  500 mg Intravenous Q6H PRN Karmen Bongo, MD 110 mL/hr at 10/13/17 1253 500 mg at 10/13/17 1253  . montelukast (SINGULAIR) tablet 10 mg  10 mg Oral QHS Karmen Bongo, MD      . ondansetron [facility]) tablet 4 mg  4 mg Oral Q6H PRN Karmen Bongo, MD       Or  . ondansetron [facility]) injection 4 mg  4 mg Intravenous Q6H PRN Karmen Bongo, MD      . pravastatin (PRAVACHOL) tablet 40 mg  40 mg Oral QPM Karmen Bongo, MD      . sodium chloride flush (NS) 0.9 % injection 3 mL  3 mL Intravenous Q12H Karmen Bongo, MD      . sorbitol 70 % solution 30 mL  30 mL Oral PRN Karmen Bongo, MD      . zidovudine (RETROVIR) tablet 300 mg  300 mg Oral Q supper Karmen Bongo, MD      . zolpidem Lorrin Mais) tablet 5 mg  5 mg Oral QHS PRN Karmen Bongo, MD       Current Outpatient Medications  Medication Sig  Dispense Refill  . albuterol (PROVENTIL HFA;VENTOLIN HFA) 108 (90 Base) MCG/ACT inhaler Inhale 2 puffs into the lungs every 4 (four) hours as needed for wheezing or shortness of breath. 1 Inhaler 5  . amLODipine (NORVASC) 10 MG tablet Take 1 tablet (10 mg total) by mouth daily. 90 tablet 3  . buPROPion (WELLBUTRIN) 75 MG tablet TAKE 1/2 TABLET TWICE A DAY (Patient taking differently: TAKE 37.5mg  by mouth TWICE A DAY) 60 tablet 1  . carvedilol (COREG) 25 MG tablet Take 1 tablet (25 mg total) by mouth 2 (two) times daily with a meal. 180 tablet 3  . cloNIDine (CATAPRES) 0.1 MG tablet Take one tablet if blood pressure is 180/110 mmHg or greater. May take one pill up to twice per day. (Patient taking differently: Take 0.1 mg by mouth See admin instructions. Take one tablet if blood pressure is 180/110 mmHg or greater. May take one pill up to twice per day.) 60 tablet 3  . cyclobenzaprine (FLEXERIL) 10 MG tablet Take 1 tablet (10 mg total) by mouth 3 (three) times daily as needed for muscle spasms. 30 tablet 0  . Dolutegravir-Rilpivirine (JULUCA) 50-25 MG TABS Take 1 tablet by mouth daily with supper. 30 tablet 11  . doxazosin (CARDURA) 8 MG tablet Take 1 tablet (8 mg total) by mouth at bedtime. 90 tablet 3  . ferric citrate (AURYXIA) 1 GM 210 MG(Fe) tablet Take 420 mg by mouth 2 (two) times daily with a meal.     . furosemide (LASIX) 40 MG tablet Take 1 tablet (40 mg total) by mouth as needed for fluid or edema. 90 tablet 3  . hydrALAZINE (APRESOLINE) 100 MG tablet Take 0.5 tablets (50 mg total) by mouth 3 (three) times daily. 270 tablet 3  . isosorbide dinitrate (ISORDIL) 30 MG tablet Take 1 tablet (30 mg total) by mouth 3 (three) times daily. 270 tablet 3  . lisinopril (PRINIVIL,ZESTRIL) 10 MG tablet TAKE 1 TABLET BY MOUTH EVERYDAY AT BEDTIME 90 tablet 3  . montelukast (SINGULAIR) 10 MG tablet Take 1 tablet (10 mg total) by mouth at bedtime. 30 tablet 2  . phenylephrine (NEO-SYNEPHRINE) 1 % nasal spray  Place 1 drop into both nostrils every 6 (six) hours as needed for congestion. 30 mL 0  . pravastatin (PRAVACHOL) 40 MG tablet TAKE 1 TABLET (40 MG TOTAL) BY MOUTH EVERY EVENING. 90 tablet 3  . zidovudine (RETROVIR) 300 MG tablet Take 1 tablet (300 mg total) by mouth daily with supper. 30 tablet 11  . ALL-IN-ONE NEBULIZER SYSTEM MISC 1 each by Does not apply route daily as needed. 1 each 0  . Blood Pressure Monitor DEVI 1 each by Does not apply route daily. 1 Device 0   Labs: Basic Metabolic Panel: Recent Labs  Lab 10/13/17 0314  NA 133*  K 5.3*  CL 94*  CO2 22  GLUCOSE 107*  BUN 40*  CREATININE 9.84*  CALCIUM 8.5*   Liver Function Tests: Recent Labs  Lab 10/13/17 0314  AST 24  ALT 13*  ALKPHOS 101  BILITOT 0.7  PROT 8.2*  ALBUMIN 4.1   Recent Labs  Lab 10/13/17 0314  LIPASE 35   CBC: Recent Labs  Lab 10/13/17 0314  WBC 9.4  NEUTROABS 7.7  HGB 14.3  HCT 43.7  MCV 100.0  PLT 199   Cardiac Enzymes: Recent Labs  Lab 10/13/17 1002  TROPONINI <0.03   Studies/Results: Ct Abdomen Pelvis W Contrast  Result Date: 10/13/2017 CLINICAL DATA:  Nausea and vomiting.  Renal failure. EXAM: CT ABDOMEN AND PELVIS WITH CONTRAST TECHNIQUE: Multidetector CT imaging of the abdomen and pelvis was performed using the standard protocol following bolus administration of intravenous contrast. CONTRAST:  171mL ISOVUE-300 IOPAMIDOL (ISOVUE-300) INJECTION 61% COMPARISON:  CT abdomen and pelvis September 29, 2014 and CT pelvis March 09, 2017 FINDINGS: Lower chest: There is patchy atelectasis in the right middle lobe and left base regions. There is parietal pleural calcification on the right posteriorly. There is a small hiatal hernia. Hepatobiliary: No focal liver lesions are appreciable. Gallbladder wall is not appreciably thickened. There is no biliary duct dilatation. Pancreas: No pancreatic mass or inflammatory focus. Spleen: The spleen measures 14.3 x 13.8 x 8.9 cm with a measured splenic  volume of 878 cubic cm. No focal splenic lesions evident. Adrenals/Urinary Tract: Adrenals bilaterally appear normal. Kidneys are small bilaterally consistent with known chronic renal failure. There is no appreciable renal mass or hydronephrosis on either side. There is no renal or ureteral calculus on either side. Urinary bladder is midline with wall thickness within normal limits. Urinary bladder is virtually decompressed. Stomach/Bowel: There is fluid throughout most of the loops of the small bowel. There is no appreciable bowel wall or mesenteric thickening. There is no evident bowel obstruction. No free air or portal venous air. Vascular/Lymphatic: There is atherosclerotic calcification in the aorta and common iliac arteries. There is also common femoral artery calcification bilaterally. No aneurysm evident. Major mesenteric vessels appear patent. No adenopathy is appreciable in the abdomen or pelvis. Reproductive: Prostate and seminal vesicles are normal in size and contour. No pelvic mass evident. Other: The appendix appears normal. There is no ascites or abscess evident in the abdomen or pelvis. There is a small ventral hernia containing only fat. Musculoskeletal: There are no blastic or lytic bone lesions. There old rib fractures on the right posteriorly. There is degenerative change in the left hip joint. No intramuscular or abdominal wall lesions are evident. IMPRESSION: 1. There is fluid in most small bowel loops. This finding may be seen normally but also may represent early ileus or enteritis. No bowel obstruction evident. No abscess. Appendix appears normal. 2. Kidney somewhat atrophic consistent with chronic renal failure. No renal or ureteral calculus. No hydronephrosis. 3. Splenic enlargement. Etiology uncertain. No focal splenic lesions evident. 4.  Small hiatal hernia.  Small ventral hernia containing only fat. 5. Aortoiliac and common femoral artery atherosclerosis. No aneurysm. 6. Evidence of  previous asbestos exposure with parietal pleural calcification on right posteriorly. Aortic Atherosclerosis (ICD10-I70.0). Electronically Signed   By: Lowella Grip III M.D.   On: 10/13/2017 08:42   Dg Chest Port 1 View  Result Date: 10/13/2017 CLINICAL DATA:  Shortness of breath EXAM: PORTABLE CHEST 1 VIEW COMPARISON:  Chest radiograph 08/16/2017 FINDINGS: Mild cardiomegaly with mild interstitial opacity. No pleural effusion or pneumothorax. No focal consolidation. IMPRESSION: Mild cardiomegaly and mild interstitial pulmonary edema. Electronically Signed   By: Ulyses Jarred M.D.   On: 10/13/2017 04:04    Dialysis Orders:  MWF at [facility] 4:15hr, 400/800, EDW 109kg, 2K/2Ca bath, AVF, heparin 6000 bolus + 3000 mid-run - Parsabiv 10mg  IV q HD - Venofer 100mg  x 10 ordered (3 given so far)  Assessment/Plan: 1.  N/V/D: ?viral gastroenteritis. C.diff pending. Per hospitalist. 2.  Hypotension on admit: Improved s/p 2L NS. Follow closely. 3.  ESRD: Usually MWF schedule. S/p full HD 3/25. Next HD 3/27. 4.  BP/volume: BP low here, typically up. Meds on hold. Follow closely, may need echo if does not ^ further. 5.  Anemia: Hgb 14.3. No ESA needed. 6.  Metabolic bone disease: Ca ok. Had been high as outpatient. Continue Auryxia as binder. 7.  HIV: Per primary.  Veneta Penton, PA-C 10/13/2017, 1:55 PM  [facility] Pager: [phone]

## 2017-10-13 NOTE — ED Notes (Signed)
Patient transported to CT 

## 2017-10-13 NOTE — H&P (Signed)
History and Physical    BURRELL HODAPP UQJ:[id number] DOB: 05-Nov-1965 DOA: 10/13/2017  PCP: Clent Demark, PA-C Consultants:  MWF HD ; Baxter Flattery - ID Patient coming from:  Home - lives with wife; NOK: wife, [phone]  Chief Complaint: N/V/D  HPI: Darrell Johnson is a 52 y.o. male with medical history significant of tobacco dependence; STI including syphilis; seizure d/o (remote); HTN; HLD: HIV; ESRD on [facility] HD: and CHF (grade 2 diastolic dysfunction with preserved EF in 9/18) presenting with N/V/D.  He went to HD yesterday, he got a pneumonia shot at the end and had already received one the week before. When he got home, he developed n/v/d.  He had continuous n/v/d.  His last episode was about 1-2 hours ago.  He was light-headed earlier, thinks it may be some better.  He is having episodic cramping abdominal and back pain.  He does still make urine and is continuing to do so.  No known sick contacts, but his 2yo nephew had a recent infection similarly and he did hold him for a while about 2-3 weeks ago.    ED Course:  Hypotension, viral gastroenteritis?  Abdominal pain, n/v/d.  BP in 60-70s on admission, now 109/57.  CT reassuring.  SDU observation based on BPs and difficulty in aggressively rehydrating in the setting of ESRD.  Review of Systems: As per HPI; otherwise review of systems reviewed and negative.   Ambulatory Status:  Ambulates without assistance  Past Medical History:  Diagnosis Date  . Anemia   . Anxiety   . Arthritis    "knees" (04/13/2017)  . Asthma   . CHF (congestive heart failure) ([place])   . ESRD (end stage renal disease) on dialysis [facility])    "MWF; [facility]" (04/13/2017)  . Gout, unspecified 08/13/2009   Qualifier: Diagnosis of  By: Redmond Pulling  MD, Mateo Flow    . H1N1 influenza    March 2016  . History of blood transfusion 02/2017   "related to OR"  . HIV infection ([place]) 1980's   on ART therapy since, followed by ID clinic, complicated  by neuropathy  .  Hyperlipidemia    hypertrygliceridemia determined ti be secondary to ART therpay  . HYPERTENSION 05/08/2006  . Pneumonia    "once; years ago" (04/13/2017)  . Rib fractures 01/2009  . Seizures ([place])    last seizure was in the 1990s, pt has family history of seizures; "probably related to alcohol" (04/13/2017)  . 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 Syphilis 1997   history of syphilis 1997  . Tobacco abuse     Past Surgical History:  Procedure Laterality Date  . AV FISTULA PLACEMENT Right 12/02/2013   Procedure: ARTERIOVENOUS (AV) FISTULA CREATION;  Surgeon: Rosetta Posner, MD;  Location: [facility];  Service: Vascular;  Laterality: Right;  . INCISION AND DRAINAGE ABSCESS Right 03/09/2017   Procedure: INCISION AND DRAINAGE Right Scrotal Abscess;  Surgeon: Franchot Gallo, MD;  Location: [facility];  Service: Urology;  Laterality: Right;  . THROMBECTOMY Right ~ 2016   "AV fistula clotted off"    Social History   Socioeconomic History  . Marital status: Married    Spouse name: Not on file  . Number of children: Not on file  . Years of education: Not on file  . Highest education level: Not on file  Occupational  History  . Occupation: details cars  Social Needs  . Financial resource strain: Not on file  . Food insecurity:    Worry: Not on file    Inability: Not on file  . Transportation needs:    Medical: Not on file    Non-medical: Not on file  Tobacco Use  . Smoking status: Former Smoker    Packs/day: 0.10    Years: 25.00    Pack years: 2.50    Types: Cigarettes    Last attempt to quit: 1935    Years since quitting: 84.2  . Smokeless tobacco: Never Used  . Tobacco comment: "quitting"  Substance and Sexual Activity  . Alcohol use: No    Alcohol/week: 0.0 oz    Comment: 04/13/2017 "quit ~ 2014"  . Drug use: No  . Sexual activity:  Never    Partners: Female    Comment: given condoms  Lifestyle  . Physical activity:    Days per week: Not on file    Minutes per session: Not on file  . Stress: Not on file  Relationships  . Social connections:    Talks on phone: Not on file    Gets together: Not on file    Attends religious service: Not on file    Active member of club or organization: Not on file    Attends meetings of clubs or organizations: Not on file    Relationship status: Not on file  . Intimate partner violence:    Fear of current or ex partner: Not on file    Emotionally abused: Not on file    Physically abused: Not on file    Forced sexual activity: Not on file  Other Topics Concern  . Not on file  Social History Narrative   ** Merged History Encounter **        No Known Allergies  Family History  Problem Relation Age of Onset  . Cancer Mother   . Hypertension Mother   . COPD Father   . Hypertension Father   . Diabetes Sister   . Hypertension Sister   . Diabetes Brother   . Hypertension Brother   . Stroke Neg Hx     Prior to Admission medications   Medication Sig Start Date End Date Taking? Authorizing Provider  albuterol (PROVENTIL HFA;VENTOLIN HFA) 108 (90 Base) MCG/ACT inhaler Inhale 2 puffs into the lungs every 4 (four) hours as needed for wheezing or shortness of breath. 05/12/17 10/13/24 Yes Clent Demark, PA-C  amLODipine (NORVASC) 10 MG tablet Take 1 tablet (10 mg total) by mouth daily. 07/28/17  Yes Clent Demark, PA-C  buPROPion [facility]) 75 MG tablet TAKE 1/2 TABLET TWICE A DAY Patient taking differently: TAKE 37.5mg  by mouth TWICE A DAY 04/16/17  Yes Axel Filler, MD  carvedilol (COREG) 25 MG tablet Take 1 tablet (25 mg total) by mouth 2 (two) times daily with a meal. 07/28/17  Yes Clent Demark, PA-C  cloNIDine (CATAPRES) 0.1 MG tablet Take one tablet if blood pressure is 180/110 mmHg or greater. May take one pill up to twice per day. Patient taking  differently: Take 0.1 mg by mouth See admin instructions. Take one tablet if blood pressure is 180/110 mmHg or greater. May take one pill up to twice per day. 08/27/17  Yes Clent Demark, PA-C  cyclobenzaprine (FLEXERIL) 10 MG tablet Take 1 tablet (10 mg total) by mouth 3 (three) times daily as needed for muscle spasms. 10/06/17  Yes Baxter Flattery,  Caren Griffins, MD  Dolutegravir-Rilpivirine (JULUCA) 50-25 MG TABS Take 1 tablet by mouth daily with supper. 08/27/17  Yes Carlyle Basques, MD  doxazosin (CARDURA) 8 MG tablet Take 1 tablet (8 mg total) by mouth at bedtime. 07/28/17  Yes Clent Demark, PA-C  ferric citrate (AURYXIA) 1 GM 210 MG(Fe) tablet Take 420 mg by mouth 2 (two) times daily with a meal.    Yes [provider]  furosemide (LASIX) 40 MG tablet Take 1 tablet (40 mg total) by mouth as needed for fluid or edema. 07/28/17  Yes Clent Demark, PA-C  hydrALAZINE (APRESOLINE) 100 MG tablet Take 0.5 tablets (50 mg total) by mouth 3 (three) times daily. 07/28/17  Yes Clent Demark, PA-C  isosorbide dinitrate (ISORDIL) 30 MG tablet Take 1 tablet (30 mg total) by mouth 3 (three) times daily. 07/28/17  Yes Clent Demark, PA-C  lisinopril (PRINIVIL,ZESTRIL) 10 MG tablet TAKE 1 TABLET BY MOUTH EVERYDAY AT BEDTIME 07/28/17  Yes Clent Demark, PA-C  montelukast (SINGULAIR) 10 MG tablet Take 1 tablet (10 mg total) by mouth at bedtime. 12/17/16  Yes Jule Ser, DO  phenylephrine (NEO-SYNEPHRINE) 1 % nasal spray Place 1 drop into both nostrils every 6 (six) hours as needed for congestion. 09/10/17  Yes Clent Demark, PA-C  pravastatin (PRAVACHOL) 40 MG tablet TAKE 1 TABLET (40 MG TOTAL) BY MOUTH EVERY EVENING. 01/27/17 01/27/18 Yes Jule Ser, DO  zidovudine (RETROVIR) 300 MG tablet Take 1 tablet (300 mg total) by mouth daily with supper. 08/27/17  Yes Carlyle Basques, MD  ALL-IN-ONE NEBULIZER SYSTEM MISC 1 each by Does not apply route daily as needed. 09/15/17   Clent Demark, PA-C    amoxicillin-clavulanate (AUGMENTIN) 500-125 MG tablet Take 1 tablet (500 mg total) by mouth 2 (two) times daily. Patient not taking: Reported on 10/13/2017 09/10/17   Clent Demark, PA-C  Blood Pressure Monitor DEVI 1 each by Does not apply route daily. 08/27/17   Clent Demark, PA-C  cyclobenzaprine (FLEXERIL) 10 MG tablet Take 1 tablet (10 mg total) by mouth at bedtime. Patient not taking: Reported on 10/13/2017 07/28/17   Clent Demark, PA-C    Physical Exam: Vitals:   10/13/17 0945 10/13/17 1000 10/13/17 1015 10/13/17 1045  BP: (!) 104/56 105/72 100/61 101/62  Pulse: 82 85 85 83  Resp: (!) 25 18 (!) 25 (!) 23  Temp:      TempSrc:      SpO2: 94% 97% 96% 95%  Weight:      Height:         General:  Appears calm and comfortable and is NAD Eyes:  PERRL, EOMI, normal lids, iris ENT:  grossly normal hearing, lips & tongue, mmm Neck:  no LAD, masses or thyromegaly; no carotid bruits Cardiovascular:  RRR, no m/r/g. No LE edema.  Respiratory:   CTA bilaterally with no wheezes/rales/rhonchi.  Normal respiratory effort. Abdomen:  soft, NT, ND, NABS Back:   normal alignment, no CVAT Skin:  no rash or induration seen on limited exam Musculoskeletal:  grossly normal tone BUE/BLE, good ROM, no bony abnormality Lower extremity:  No LE edema.  Limited foot exam with no ulcerations.  2+ distal pulses. Psychiatric:  grossly normal mood and affect, speech fluent and appropriate, AOx3 Neurologic:  CN 2-12 grossly intact, moves all extremities in coordinated fashion, sensation intact    Radiological Exams on Admission: Ct Abdomen Pelvis W Contrast  Result Date: 10/13/2017 CLINICAL DATA:  Nausea and vomiting.  Renal  failure. EXAM: CT ABDOMEN AND PELVIS WITH CONTRAST TECHNIQUE: Multidetector CT imaging of the abdomen and pelvis was performed using the standard protocol following bolus administration of intravenous contrast. CONTRAST:  181mL ISOVUE-300 IOPAMIDOL (ISOVUE-300) INJECTION 61%  COMPARISON:  CT abdomen and pelvis September 29, 2014 and CT pelvis March 09, 2017 FINDINGS: Lower chest: There is patchy atelectasis in the right middle lobe and left base regions. There is parietal pleural calcification on the right posteriorly. There is a small hiatal hernia. Hepatobiliary: No focal liver lesions are appreciable. Gallbladder wall is not appreciably thickened. There is no biliary duct dilatation. Pancreas: No pancreatic mass or inflammatory focus. Spleen: The spleen measures 14.3 x 13.8 x 8.9 cm with a measured splenic volume of 878 cubic cm. No focal splenic lesions evident. Adrenals/Urinary Tract: Adrenals bilaterally appear normal. Kidneys are small bilaterally consistent with known chronic renal failure. There is no appreciable renal mass or hydronephrosis on either side. There is no renal or ureteral calculus on either side. Urinary bladder is midline with wall thickness within normal limits. Urinary bladder is virtually decompressed. Stomach/Bowel: There is fluid throughout most of the loops of the small bowel. There is no appreciable bowel wall or mesenteric thickening. There is no evident bowel obstruction. No free air or portal venous air. Vascular/Lymphatic: There is atherosclerotic calcification in the aorta and common iliac arteries. There is also common femoral artery calcification bilaterally. No aneurysm evident. Major mesenteric vessels appear patent. No adenopathy is appreciable in the abdomen or pelvis. Reproductive: Prostate and seminal vesicles are normal in size and contour. No pelvic mass evident. Other: The appendix appears normal. There is no ascites or abscess evident in the abdomen or pelvis. There is a small ventral hernia containing only fat. Musculoskeletal: There are no blastic or lytic bone lesions. There old rib fractures on the right posteriorly. There is degenerative change in the left hip joint. No intramuscular or abdominal wall lesions are evident. IMPRESSION: 1.  There is fluid in most small bowel loops. This finding may be seen normally but also may represent early ileus or enteritis. No bowel obstruction evident. No abscess. Appendix appears normal. 2. Kidney somewhat atrophic consistent with chronic renal failure. No renal or ureteral calculus. No hydronephrosis. 3. Splenic enlargement. Etiology uncertain. No focal splenic lesions evident. 4.  Small hiatal hernia.  Small ventral hernia containing only fat. 5. Aortoiliac and common femoral artery atherosclerosis. No aneurysm. 6. 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 Aortic Atherosclerosis (ICD10-I70.0). Electronically Signed   By: Lowella Grip III M.D.   On: 10/13/2017 08:42   Dg Chest Port 1 View  Result Date: 10/13/2017 CLINICAL DATA:  Shortness of breath EXAM: PORTABLE CHEST 1 VIEW COMPARISON:  Chest radiograph 08/16/2017 FINDINGS: Mild cardiomegaly with mild interstitial opacity. No pleural effusion or pneumothorax. No focal consolidation. IMPRESSION: Mild cardiomegaly and mild interstitial pulmonary edema. Electronically Signed   By: Ulyses Jarred M.D.   On: 10/13/2017 04:04    EKG: Independently reviewed.  NSR with rate 98; nonspecific ST changes in the anterior leads - ST elevation vs. Early repolarization, new since prior study  Labs on Admission: I have personally reviewed the available labs and imaging studies at the time of the admission.  Pertinent labs:   Na++ 133 K+ 5.3 Glucose 107 BUN 40/Creatinine 9.84/GFR 6 Anion gap 17 Essentially normal CBC HIV viral load 1.82 in 2/19 CD4 count 390 in 2/19   Assessment/Plan Principal Problem:   Hypotension due to hypovolemia  Active Problems:   HIV disease ([place])   Essential hypertension   Tobacco abuse   ESRD on dialysis ([place])   Viral gastroenteritis   Hypotension due to hypovolemia -Patient with acute onset n/v/d yesterday following HD -It is not clear at this time how much  volume he had pulled off at HD -He appears to have received 2L IVF in the ER and continues to have borderline hypotension (last BP 80s/50s) -Will order LR boluses at 250 cc over 30 minutes q1h prn for a total of 3 additional boluses -Overnight observation in SDU on telemetry -If he continues to improve, likely appropriate for d/c to home tomorrow  Viral gastroenteritis -Symptoms started acutely yesterday after HD -CT unremarkable -CBC unremarkable -Suspect viral GE -Will order C diff and GI panel based on his h/o HIV with still detectable viral load at load check -Enteric precautions for now  HIV disease -Seen by [name] on 3/19 -Continues to have a detectable viral load, slightly low CD4 count -Continue Juluca, Retrovir  HTN -Hold Norvasc, Coreg, Cardura, Hydralazine, and Lasix for now -With so many medications usually needed for HTN control, his hypotension is a bit more concerning -Anticipate need to resume medications in the next 12-24 hours  ESRD on HD -Patient on chronic MWF HD -Nephrology prn order set utilized -He does not appear to be volume overloaded or otherwise in need of acute HD -Patient was discussed with Dr. Hollie Salk; she is very familiar with this patient  Tobacco abuse -Encourage cessation.  This was discussed with the patient and should be reviewed on an ongoing basis.  He reports that he is "Quitting."  -Patch declined.   DVT prophylaxis:  Lovenox  Code Status: Full - confirmed with patient/family Family Communication: Wife present throughout evaluation Disposition Plan:  Home once clinically improved Consults called: Nephrology  Admission status: It is my clinical opinion that referral for OBSERVATION is reasonable and necessary in this patient based on the above information provided. The aforementioned taken together are felt to place the patient at high risk for further clinical deterioration. However it is anticipated that the patient may be medically  stable for discharge from the hospital within 24 to 48 hours.    Karmen Bongo MD [facility]  If note is complete, please contact covering daytime or nighttime physician. [email or web address] Password [facility]  10/13/2017, 10:57 AM

## 2017-10-13 NOTE — ED Notes (Signed)
Pt care assumed, obtained verbal report.  Pt is resting and appears comfortable with eyes closed.  Symmetric rise and fall of chest noted.  VS WNL.  Waiting for a bed assignment

## 2017-10-13 NOTE — ED Notes (Signed)
Attempted to call report

## 2017-10-14 DIAGNOSIS — A084 Viral intestinal infection, unspecified: Secondary | ICD-10-CM | POA: Diagnosis present

## 2017-10-14 DIAGNOSIS — R197 Diarrhea, unspecified: Secondary | ICD-10-CM | POA: Diagnosis not present

## 2017-10-14 DIAGNOSIS — G40909 Epilepsy, unspecified, not intractable, without status epilepticus: Secondary | ICD-10-CM | POA: Diagnosis present

## 2017-10-14 DIAGNOSIS — I132 Hypertensive heart and chronic kidney disease with heart failure and with stage 5 chronic kidney disease, or end stage renal disease: Secondary | ICD-10-CM | POA: Diagnosis present

## 2017-10-14 DIAGNOSIS — Z79899 Other long term (current) drug therapy: Secondary | ICD-10-CM | POA: Diagnosis not present

## 2017-10-14 DIAGNOSIS — Z87891 Personal history of nicotine dependence: Secondary | ICD-10-CM | POA: Diagnosis not present

## 2017-10-14 DIAGNOSIS — Z720 Tobacco use: Secondary | ICD-10-CM | POA: Diagnosis not present

## 2017-10-14 DIAGNOSIS — A0811 Acute gastroenteropathy due to Norwalk agent: Secondary | ICD-10-CM | POA: Diagnosis not present

## 2017-10-14 DIAGNOSIS — I9589 Other hypotension: Secondary | ICD-10-CM | POA: Diagnosis present

## 2017-10-14 DIAGNOSIS — Z825 Family history of asthma and other chronic lower respiratory diseases: Secondary | ICD-10-CM | POA: Diagnosis not present

## 2017-10-14 DIAGNOSIS — I10 Essential (primary) hypertension: Secondary | ICD-10-CM

## 2017-10-14 DIAGNOSIS — E861 Hypovolemia: Secondary | ICD-10-CM | POA: Diagnosis present

## 2017-10-14 DIAGNOSIS — R1084 Generalized abdominal pain: Secondary | ICD-10-CM

## 2017-10-14 DIAGNOSIS — B20 Human immunodeficiency virus [HIV] disease: Secondary | ICD-10-CM | POA: Diagnosis not present

## 2017-10-14 DIAGNOSIS — E8889 Other specified metabolic disorders: Secondary | ICD-10-CM | POA: Diagnosis present

## 2017-10-14 DIAGNOSIS — Z820 Family history of epilepsy and other diseases of the nervous system: Secondary | ICD-10-CM | POA: Diagnosis not present

## 2017-10-14 DIAGNOSIS — D631 Anemia in chronic kidney disease: Secondary | ICD-10-CM | POA: Diagnosis present

## 2017-10-14 DIAGNOSIS — A0472 Enterocolitis due to Clostridium difficile, not specified as recurrent: Principal | ICD-10-CM

## 2017-10-14 DIAGNOSIS — E785 Hyperlipidemia, unspecified: Secondary | ICD-10-CM | POA: Diagnosis present

## 2017-10-14 DIAGNOSIS — R112 Nausea with vomiting, unspecified: Secondary | ICD-10-CM | POA: Diagnosis not present

## 2017-10-14 DIAGNOSIS — Z992 Dependence on renal dialysis: Secondary | ICD-10-CM | POA: Diagnosis not present

## 2017-10-14 DIAGNOSIS — N186 End stage renal disease: Secondary | ICD-10-CM | POA: Diagnosis not present

## 2017-10-14 DIAGNOSIS — J45909 Unspecified asthma, uncomplicated: Secondary | ICD-10-CM | POA: Diagnosis present

## 2017-10-14 DIAGNOSIS — I5032 Chronic diastolic (congestive) heart failure: Secondary | ICD-10-CM | POA: Diagnosis present

## 2017-10-14 DIAGNOSIS — Z8249 Family history of ischemic heart disease and other diseases of the circulatory system: Secondary | ICD-10-CM | POA: Diagnosis not present

## 2017-10-14 DIAGNOSIS — N2581 Secondary hyperparathyroidism of renal origin: Secondary | ICD-10-CM | POA: Diagnosis present

## 2017-10-14 DIAGNOSIS — I959 Hypotension, unspecified: Secondary | ICD-10-CM | POA: Diagnosis not present

## 2017-10-14 DIAGNOSIS — M170 Bilateral primary osteoarthritis of knee: Secondary | ICD-10-CM | POA: Diagnosis present

## 2017-10-14 LAB — GASTROINTESTINAL PANEL BY PCR, STOOL (REPLACES STOOL CULTURE)

## 2017-10-14 LAB — BASIC METABOLIC PANEL
Anion gap: 17 — ABNORMAL HIGH (ref 5–15)
BUN: 58 mg/dL — ABNORMAL HIGH (ref 6–20)
CHLORIDE: 90 mmol/L — AB (ref 101–111)
CO2: 23 mmol/L (ref 22–32)
Calcium: 8 mg/dL — ABNORMAL LOW (ref 8.9–10.3)
Creatinine, Ser: 12.56 mg/dL — ABNORMAL HIGH (ref 0.61–1.24)
GFR calc non Af Amer: 4 mL/min — ABNORMAL LOW (ref >60)
GFR, EST AFRICAN AMERICAN: 5 mL/min — AB (ref >60)
Glucose, Bld: 80 mg/dL (ref 65–99)
Potassium: 4.8 mmol/L (ref 3.5–5.1)
SODIUM: 130 mmol/L — AB (ref 135–145)

## 2017-10-14 LAB — CBC
HEMATOCRIT: 37.8 % — AB (ref 39.0–52.0)
Hemoglobin: 12.1 g/dL — ABNORMAL LOW (ref 13.0–17.0)
MCH: 32.3 pg (ref 26.0–34.0)
MCHC: 32 g/dL (ref 30.0–36.0)
MCV: 100.8 fL — AB (ref 78.0–100.0)
Platelets: 140 10*3/uL — ABNORMAL LOW (ref 150–400)
RBC: 3.75 MIL/uL — ABNORMAL LOW (ref 4.22–5.81)
RDW: 18.9 % — ABNORMAL HIGH (ref 11.5–15.5)
WBC: 5.5 10*3/uL (ref 4.0–10.5)

## 2017-10-14 LAB — MRSA PCR SCREENING: MRSA BY PCR: NEGATIVE

## 2017-10-14 MED ORDER — METHOCARBAMOL 500 MG PO TABS
500.0000 mg | ORAL_TABLET | Freq: Once | ORAL | Status: AC
  Administered 2017-10-14: 500 mg via ORAL
  Filled 2017-10-14: qty 1

## 2017-10-14 MED ORDER — VANCOMYCIN 50 MG/ML ORAL SOLUTION
125.0000 mg | Freq: Four times a day (QID) | ORAL | Status: DC
  Administered 2017-10-14 – 2017-10-15 (×4): 125 mg via ORAL
  Filled 2017-10-14 (×6): qty 2.5

## 2017-10-14 NOTE — Progress Notes (Addendum)
PROGRESS NOTE    Darrell Johnson  IFO:[id number] DOB: [birth year] DOA: 10/13/2017 PCP: Clent Demark, PA-C   Brief Narrative:  52 y.o. BM PMHx Tobacco dependence; STD including syphilis, gonorrhea and trichomonas, penile condylomata ; HIV on ART; Seizure d/o (remote); HTN; HLD:  ESRD on HD W/M/F  and Chronic Diastolic CHF  with preserved EF in 9/18)   Presenting with N/V/D.  He went to HD yesterday, he got a pneumonia shot at the end and had already received one the week before. When he got home, he developed n/v/d.  He had continuous n/v/d.  His last episode was about 1-2 hours ago.  He was light-headed earlier, thinks it may be some better.  He is having episodic cramping abdominal and back pain.  He does still make urine and is continuing to do so.  No known sick contacts, but his 2yo nephew had a recent infection similarly and he did hold him for a while about 2-3 weeks ago.     ED Course:  Hypotension, viral gastroenteritis?  Abdominal pain, n/v/d.  BP in 60-70s on admission, now 109/57.  CT reassuring.  SDU observation based on BPs and difficulty in aggressively rehydrating in the setting of ESRD.    Subjective: 3/27/O x4, negative CP, negative S OB positive abdominal pain, positive nausea, negative vomiting, positive diarrhea (significantly improved)     Assessment & Plan:   Principal Problem:   Hypotension due to hypovolemia Active Problems:   HIV disease ([place])   Essential hypertension   Tobacco abuse   ESRD on dialysis ([place])   Viral gastroenteritis   Hypotension due to hypovolemia -Patient with acute onset n/v/d yesterday following HD -It is not clear at this time how much volume he had pulled off at HD -He appears to have received 2L IVF in the ER and continues to have borderline hypotension (last BP 80s/50s) -Will order LR boluses at 250 cc over 30 minutes q1h prn for a total of 3 additional boluses -If tolerates p.o. vancomycin DC tomorrow    Viral  gastroenteritis positive norovirus G1/G2 -Symptoms started acutely yesterday after HD -CT unremarkable -CBC unremarkable -Suspect viral GE -Will order C diff and GI panel based on his h/o HIV with still detectable viral load at load check -Enteric precautions for now  Positive C. difficile colitis -Vancomycin p.o. 10 days   HIV disease -Seen by [name] on 3/19 -Continues to have a detectable viral load, slightly low CD4 count -Continue Juluca, Retrovir   Essential HTN -Hold Norvasc, Coreg, Cardura, Hydralazine, and Lasix for now -With so many medications usually needed for HTN control, his hypotension is a bit more concerning -Anticipate need to resume medications in the next 12-24 hours   ESRD on HD M/W/F -Patient on chronic MWF HD -Nephrology prn order set utilized -He does not appear to be volume overloaded or otherwise in need of acute HD -Patient was discussed with Dr. Hollie Salk; she is very familiar with this patient   Tobacco abuse -Encourage cessation.  This was discussed with the patient and should be reviewed on an ongoing basis.  He reports that he is "Quitting."  -Patch declined.    DVT prophylaxis: Lovenox Code Status: Full Family Communication: None Disposition Plan: Discharge 3/28   Consultants:  Nephrology  Procedures/Significant Events:  None   I have personally reviewed and interpreted all radiology studies and my findings are as above.  VENTILATOR SETTINGS:    Cultures 3/26 stool positive C. difficile antigen/toxigenic C. difficile.  Negative C. difficile toxin 3/26 GI panel positive norovirus G1/G2   Antimicrobials: Anti-infectives (From admission, onward)   Start     Stop   10/14/17 1000  vancomycin (VANCOCIN) 50 mg/mL oral solution 125 mg     10/24/17 0959   10/13/17 1715  dolutegravir (TIVICAY) tablet 50 mg         10/13/17 1715  rilpivirine (EDURANT) tablet 25 mg         10/13/17 1700  Dolutegravir-Rilpivirine 50-25 MG TABS 1  tablet  Status:  Discontinued     10/13/17 1700   10/13/17 1700  zidovudine (RETROVIR) capsule 300 mg             Devices    LINES / TUBES:      Continuous Infusions: . sodium chloride    . sodium chloride    . lactated ringers    . methocarbamol (ROBAXIN)  IV Stopped (10/13/17 1923)     Objective: Vitals:   10/13/17 2113 10/14/17 0002 10/14/17 0326 10/14/17 0751  BP:  (!) 126/52 (!) 141/68 (!) 144/71  Pulse:  86 78 95  Resp:  17 15   Temp:  98.2 F (36.8 C) 98.5 F (36.9 C) 97.6 F (36.4 C)  TempSrc:  Oral Oral Oral  SpO2:  90% 98% 96%  Weight: 245 lb 2.4 oz (111.2 kg)     Height:        Intake/Output Summary (Last 24 hours) at 10/14/2017 1610 Last data filed at 10/14/2017 9604 Gross per 24 hour  Intake 173 ml  Output -  Net 173 ml   Filed Weights   10/13/17 0259 10/13/17 2113  Weight: 247 lb (112 kg) 245 lb 2.4 oz (111.2 kg)    Examination:  General: A/O x4 No acute respiratory distress Neck:  Negative scars, masses, torticollis, lymphadenopathy, JVD Lungs: Clear to auscultation bilaterally without wheezes or crackles Cardiovascular: Regular rate and rhythm without murmur gallop or rub normal S1 and S2 Abdomen: negative abdominal pain, nondistended, positive soft, bowel sounds, no rebound, no ascites, no appreciable mass Extremities: No significant cyanosis, clubbing, or edema bilateral lower extremities Skin: Negative rashes, lesions, ulcers Psychiatric:  Negative depression, negative anxiety, negative fatigue, negative mania  Central nervous system:  Cranial nerves II through XII intact, tongue/uvula midline, all extremities muscle strength 5/5, sensation intact throughout, negative dysarthria, negative expressive aphasia, negative receptive aphasia.  .     Data Reviewed: Care during the described time interval was provided by me .  I have reviewed this patient's available data, including medical history, events of note, physical examination, and  all test results as part of my evaluation.   CBC: Recent Labs  Lab 10/13/17 0314 10/14/17 0243  WBC 9.4 5.5  NEUTROABS 7.7  --   HGB 14.3 12.1*  HCT 43.7 37.8*  MCV 100.0 100.8*  PLT 199 540*   Basic Metabolic Panel: Recent Labs  Lab 10/13/17 0314 10/14/17 0243  NA 133* 130*  K 5.3* 4.8  CL 94* 90*  CO2 22 23  GLUCOSE 107* 80  BUN 40* 58*  CREATININE 9.84* 12.56*  CALCIUM 8.5* 8.0*   GFR: Estimated Creatinine Clearance: 8.5 mL/min (A) (by C-G formula based on SCr of 12.56 mg/dL (H)). Liver Function Tests: Recent Labs  Lab 10/13/17 0314  AST 24  ALT 13*  ALKPHOS 101  BILITOT 0.7  PROT 8.2*  ALBUMIN 4.1   Recent Labs  Lab 10/13/17 0314  LIPASE 35   No results for input(s): AMMONIA in  the last 168 hours. Coagulation Profile: No results for input(s): INR, PROTIME in the last 168 hours. Cardiac Enzymes: Recent Labs  Lab 10/13/17 1002 10/13/17 1800 10/13/17 2149  TROPONINI <0.03 <0.03 <0.03   BNP (last 3 results) No results for input(s): PROBNP in the last 8760 hours. HbA1C: No results for input(s): HGBA1C in the last 72 hours. CBG: No results for input(s): GLUCAP in the last 168 hours. Lipid Profile: No results for input(s): CHOL, HDL, LDLCALC, TRIG, CHOLHDL, LDLDIRECT in the last 72 hours. Thyroid Function Tests: No results for input(s): TSH, T4TOTAL, FREET4, T3FREE, THYROIDAB in the last 72 hours. Anemia Panel: No results for input(s): VITAMINB12, FOLATE, FERRITIN, TIBC, IRON, RETICCTPCT in the last 72 hours. Urine analysis:    Component Value Date/Time   COLORURINE YELLOW 07/06/2013 1808   APPEARANCEUR CLEAR 07/06/2013 1808   LABSPEC 1.013 07/06/2013 1808   PHURINE 7.5 07/06/2013 1808   GLUCOSEU NEGATIVE 07/06/2013 1808   GLUCOSEU NEG mg/dL 11/01/2009 2123   HGBUR SMALL (A) 07/06/2013 1808   BILIRUBINUR NEGATIVE 07/06/2013 1808   KETONESUR NEGATIVE 07/06/2013 1808   PROTEINUR >300 (A) 07/06/2013 1808   UROBILINOGEN 0.2 07/06/2013 1808    NITRITE NEGATIVE 07/06/2013 1808   LEUKOCYTESUR NEGATIVE 07/06/2013 1808   Sepsis Labs: @LABRCNTIP (procalcitonin:4,lacticidven:4)  ) Recent Results (from the past 240 hour(s))  C difficile quick scan w PCR reflex     Status: Abnormal   Collection Time: 10/13/17  2:26 PM  Result Value Ref Range Status   C Diff antigen POSITIVE (A) NEGATIVE Final   C Diff toxin NEGATIVE NEGATIVE Final   C Diff interpretation Results are indeterminate. See PCR results.  Final    Comment: Performed at [facility], [place] [address], [place], [place] [address]  C. Diff by PCR, Reflexed     Status: Abnormal   Collection Time: 10/13/17  2:26 PM  Result Value Ref Range Status   Toxigenic C. Difficile by PCR POSITIVE (A) NEGATIVE Final    Comment: Positive for toxigenic C. difficile with little to no toxin production. Only treat if clinical presentation suggests symptomatic illness. Performed at [facility], [place] [address] [place], [place] [address]   MRSA PCR Screening     Status: None   Collection Time: 10/13/17  9:04 PM  Result Value Ref Range Status   MRSA by PCR NEGATIVE NEGATIVE Final    Comment:        The GeneXpert MRSA Assay (FDA approved for NASAL specimens only), is one component of a comprehensive MRSA colonization surveillance program. It is not intended to diagnose MRSA infection nor to guide or monitor treatment for MRSA infections. Performed at [facility], [place] [address], [place], [place] [address]          Radiology Studies: Ct Abdomen Pelvis W Contrast  Result Date: 10/13/2017 CLINICAL DATA:  Nausea and vomiting.  Renal failure. EXAM: CT ABDOMEN AND PELVIS WITH CONTRAST TECHNIQUE: Multidetector CT imaging of the abdomen and pelvis was performed using the standard protocol following bolus administration of intravenous contrast. CONTRAST:  123mL ISOVUE-300 IOPAMIDOL (ISOVUE-300) INJECTION 61% COMPARISON:  CT abdomen and pelvis September 29, 2014 and CT pelvis  March 09, 2017 FINDINGS: Lower chest: There is patchy atelectasis in the right middle lobe and left base regions. There is parietal pleural calcification on the right posteriorly. There is a small hiatal hernia. Hepatobiliary: No focal liver lesions are appreciable. Gallbladder wall is not appreciably thickened. There is no biliary duct dilatation.  Pancreas: No pancreatic mass or inflammatory focus. Spleen: The spleen measures 14.3 x 13.8 x 8.9 cm with a measured splenic volume of 878 cubic cm. No focal splenic lesions evident. Adrenals/Urinary Tract: Adrenals bilaterally appear normal. Kidneys are small bilaterally consistent with known chronic renal failure. There is no appreciable renal mass or hydronephrosis on either side. There is no renal or ureteral calculus on either side. Urinary bladder is midline with wall thickness within normal limits. Urinary bladder is virtually decompressed. Stomach/Bowel: There is fluid throughout most of the loops of the small bowel. There is no appreciable bowel wall or mesenteric thickening. There is no evident bowel obstruction. No free air or portal venous air. Vascular/Lymphatic: There is atherosclerotic calcification in the aorta and common iliac arteries. There is also common femoral artery calcification bilaterally. No aneurysm evident. Major mesenteric vessels appear patent. No adenopathy is appreciable in the abdomen or pelvis. Reproductive: Prostate and seminal vesicles are normal in size and contour. No pelvic mass evident. Other: The appendix appears normal. There is no ascites or abscess evident in the abdomen or pelvis. There is a small ventral hernia containing only fat. Musculoskeletal: There are no blastic or lytic bone lesions. There old rib fractures on the right posteriorly. There is degenerative change in the left hip joint. No intramuscular or abdominal wall lesions are evident. IMPRESSION: 1. There is fluid in most small bowel loops. This finding may be  seen normally but also may represent early ileus or enteritis. No bowel obstruction evident. No abscess. Appendix appears normal. 2. Kidney somewhat atrophic consistent with chronic renal failure. No renal or ureteral calculus. No hydronephrosis. 3. Splenic enlargement. Etiology uncertain. No focal splenic lesions evident. 4.  Small hiatal hernia.  Small ventral hernia containing only fat. 5. Aortoiliac and common femoral artery atherosclerosis. No aneurysm. 6. 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 Aortic Atherosclerosis (ICD10-I70.0). Electronically Signed   By: Lowella Grip III M.D.   On: 10/13/2017 08:42   Dg Chest Port 1 View  Result Date: 10/13/2017 CLINICAL DATA:  Shortness of breath EXAM: PORTABLE CHEST 1 VIEW COMPARISON:  Chest radiograph 08/16/2017 FINDINGS: Mild cardiomegaly with mild interstitial opacity. No pleural effusion or pneumothorax. No focal consolidation. IMPRESSION: Mild cardiomegaly and mild interstitial pulmonary edema. Electronically Signed   By: Ulyses Jarred M.D.   On: 10/13/2017 04:04        Scheduled Meds: . buPROPion  37.5 mg Oral BID  . dolutegravir  50 mg Oral Q supper  . enoxaparin (LOVENOX) injection  30 mg Subcutaneous Q24H  . ferric citrate  420 mg Oral BID WC  . montelukast  10 mg Oral QHS  . pravastatin  40 mg Oral QPM  . rilpivirine  25 mg Oral Q supper  . sodium chloride flush  3 mL Intravenous Q12H  . zidovudine  300 mg Oral Q supper   Continuous Infusions: . sodium chloride    . sodium chloride    . lactated ringers    . methocarbamol (ROBAXIN)  IV Stopped (10/13/17 1923)     LOS: 0 days    Time spent: 40 minutes    Meerab Maselli, Geraldo Docker, MD [facility] Pager [phone]   If 7PM-7AM, please contact night-coverage [email or web address] Password [facility] 10/14/2017, 8:03 AM

## 2017-10-14 NOTE — Progress Notes (Signed)
Pt continues to desat down in the 50's and 60s while sleeping. Made MD aware of the need of possible CPAP. Will continue to monitor.  Tressie Ellis, RN

## 2017-10-14 NOTE — Progress Notes (Signed)
  [facility] Progress Note   Assessment/ Plan:   Dialysis Orders:  MWF at [facility] 4:15hr, 400/800, EDW 109kg, 2K/2Ca bath, AVF, heparin 6000 bolus + 3000 mid-run - Parsabiv 10mg  IV q HD - Venofer 100mg  x 10 ordered (3 given so far)  Assessment/Plan: 1.  N/V/D:  C.diff PCR +.  Would treat, per primary. 2.  Hypotension on admit: Improved s/p fluid resuscitation.  Now better. 3.  ESRD: Usually MWF schedule. S/p full HD 3/25. Next HD 3/27. 4.  BP/volume: BP improved after fluid.  Trops negative, doubt cardiac etiology.  Follow closely, per primary 5.  Anemia: Hgb 14.3. No ESA needed. 6.  Metabolic bone disease: Ca ok. Had been high as outpatient. Continue Auryxia as binder. 7.  HIV: Per primary. 8. Dispo: from renal perspective could go after HD if tolerates OK    Subjective:    Feeling much better today.  C diff PCR +   Objective:   BP (!) 144/71   Pulse 95   Temp 97.6 F (36.4 C) (Oral)   Resp 15   Ht 5\' 9"  (1.753 m)   Wt 111.2 kg (245 lb 2.4 oz)   SpO2 96%   BMI 36.20 kg/m   Physical Exam: General: Well developed, well nourished, in no acute distress. HEENT: Normocephalic, atraumatic, sclera non-icteric, mucus membranes are moist. No JVD. Lungs: Clear bilaterally to auscultation without wheezes, rales, or rhonchi. Breathing is unlabored. Heart: RRR with normal S1, S2. No murmurs, rubs, or gallops appreciated. Abdomen: Soft, mild generalized tenderness without guarding. Lower extremities: no LE edema Neuro: Alert and oriented X 3. Moves all extremities spontaneously. Dialysis Access: AVG + bruit   Labs: BMET Recent Labs  Lab 10/13/17 0314 10/14/17 0243  NA 133* 130*  K 5.3* 4.8  CL 94* 90*  CO2 22 23  GLUCOSE 107* 80  BUN 40* 58*  CREATININE 9.84* 12.56*  CALCIUM 8.5* 8.0*   CBC Recent Labs  Lab 10/13/17 0314 10/14/17 0243  WBC 9.4 5.5  NEUTROABS 7.7  --   HGB 14.3 12.1*  HCT 43.7 37.8*  MCV 100.0 100.8*  PLT 199 140*     @IMGRELPRIORS @ Medications:    . buPROPion  37.5 mg Oral BID  . dolutegravir  50 mg Oral Q supper  . enoxaparin (LOVENOX) injection  30 mg Subcutaneous Q24H  . ferric citrate  420 mg Oral BID WC  . montelukast  10 mg Oral QHS  . pravastatin  40 mg Oral QPM  . rilpivirine  25 mg Oral Q supper  . sodium chloride flush  3 mL Intravenous Q12H  . vancomycin  125 mg Oral QID  . zidovudine  300 mg Oral Q supper     Madelon Lips, MD [facility]  pgr [phone] 10/14/2017, 10:53 AM

## 2017-10-15 MED ORDER — CALCIUM CARBONATE ANTACID 1250 MG/5ML PO SUSP: 500.0000 mg | Freq: Four times a day (QID) | ORAL | 0 refills | Status: DC | PRN

## 2017-10-15 MED ORDER — ZIDOVUDINE 100 MG PO CAPS: 300.0000 mg | ORAL_CAPSULE | Freq: Every day | ORAL | 0 refills | Status: DC

## 2017-10-15 MED ORDER — DOLUTEGRAVIR SODIUM 50 MG PO TABS: 50.0000 mg | ORAL_TABLET | Freq: Every day | ORAL | 0 refills | Status: DC

## 2017-10-15 MED ORDER — CAMPHOR-MENTHOL 0.5-0.5 % EX LOTN: 1.0000 "application " | TOPICAL_LOTION | Freq: Three times a day (TID) | CUTANEOUS | 0 refills | Status: DC | PRN

## 2017-10-15 MED ORDER — FERRIC CITRATE 1 GM 210 MG(FE) PO TABS: 420.0000 mg | ORAL_TABLET | Freq: Two times a day (BID) | ORAL | 0 refills | Status: DC

## 2017-10-15 MED ORDER — RILPIVIRINE HCL 25 MG PO TABS: 25.0000 mg | ORAL_TABLET | Freq: Every day | ORAL | 0 refills | Status: DC

## 2017-10-15 MED ORDER — VANCOMYCIN 50 MG/ML ORAL SOLUTION: 125.0000 mg | Freq: Four times a day (QID) | ORAL | 0 refills | Status: DC

## 2017-10-15 NOTE — Progress Notes (Signed)
Pt discharged to home, app discharge instructions, education  and prescriptions provided to patient who had no questions. Pt stable for discharge. IV removed.

## 2017-10-15 NOTE — Progress Notes (Signed)
  [facility] Progress Note   Assessment/ Plan:   Dialysis Orders:  MWF at [facility] 4:15hr, 400/800, EDW 109kg, 2K/2Ca bath, AVF, heparin 6000 bolus + 3000 mid-run - Parsabiv 10mg  IV q HD - Venofer 100mg  x 10 ordered (3 given so far)  Assessment/Plan: 1.  N/V/D:  C.diff PCR + and norovirus +.  Getting PO vanc.   2.  Hypotension on admit: Improved s/p fluid resuscitation.  Now better. 3.  ESRD: Usually MWF schedule. S/p full HD 3/25. And tolerated HD on 3/27 OK. 4.  BP/volume: BP improved after fluid.  Trops negative, doubt cardiac etiology.  Follow closely, per primary 5.  Anemia: Hgb 14.3. No ESA needed. 6.  Metabolic bone disease: Ca ok. Had been high as outpatient. Continue Auryxia as binder. 7.  HIV: Per primary. 8. Dispo: OK to go from my perspective.    Subjective:    Feeling much better today.  N/v/d stopped.  C diff PCR + and GI pathogen panel notes norovirus.  Tolerated HD yesterday without issue.   Objective:   BP (!) 156/73   Pulse 87   Temp 98.8 F (37.1 C) (Oral)   Resp 18   Ht 5\' 9"  (1.753 m)   Wt 110.3 kg (243 lb 2.7 oz)   SpO2 98%   BMI 35.91 kg/m   Physical Exam: General: Well developed, well nourished, in no acute distress. HEENT: Normocephalic, atraumatic, sclera non-icteric, mucus membranes are moist. No JVD. Lungs: Clear bilaterally to auscultation without wheezes, rales, or rhonchi. Breathing is unlabored. Heart: RRR with normal S1, S2. No murmurs, rubs, or gallops appreciated. Abdomen: Soft, mild generalized tenderness without guarding. Lower extremities: no LE edema Neuro: Alert and oriented X 3. Moves all extremities spontaneously. Dialysis Access: AVG + bruit   Labs: BMET Recent Labs  Lab 10/13/17 0314 10/14/17 0243  NA 133* 130*  K 5.3* 4.8  CL 94* 90*  CO2 22 23  GLUCOSE 107* 80  BUN 40* 58*  CREATININE 9.84* 12.56*  CALCIUM 8.5* 8.0*   CBC Recent Labs  Lab 10/13/17 0314 10/14/17 0243  WBC 9.4 5.5  NEUTROABS  7.7  --   HGB 14.3 12.1*  HCT 43.7 37.8*  MCV 100.0 100.8*  PLT 199 140*    @IMGRELPRIORS @ Medications:    . buPROPion  37.5 mg Oral BID  . dolutegravir  50 mg Oral Q supper  . enoxaparin (LOVENOX) injection  30 mg Subcutaneous Q24H  . ferric citrate  420 mg Oral BID WC  . montelukast  10 mg Oral QHS  . pravastatin  40 mg Oral QPM  . rilpivirine  25 mg Oral Q supper  . sodium chloride flush  3 mL Intravenous Q12H  . vancomycin  125 mg Oral QID  . zidovudine  300 mg Oral Q supper     Madelon Lips, MD [place]  pgr [phone] 10/15/2017, 9:35 AM

## 2017-10-15 NOTE — Discharge Summary (Signed)
Physician Discharge Summary  Darrell Johnson EVO:[id number] DOB: 08-11-1965 DOA: 10/13/2017  PCP: Clent Demark, PA-C  Admit date: 10/13/2017 Discharge date: 10/15/2017  Time spent: 35 minutes  Recommendations for Outpatient Follow-up:  Hypotension due to hypovolemia -Resolved -Volume controlled via HD.  Patient with acute onset n/v/d yesterday following HD   Viral gastroenteritis positive norovirus G1/G2 -Symptoms started acutely yesterday after HD -Patient tolerating p.o. nutrition, negative abdominal pain, negative N/V.  Tolerating p.o. medication.   -Schedule follow-up appointment with PA Roderic Ovens 1 week Norovirus gastroenteritis   Positive C. difficile colitis -Vancomycin p.o. 10 days   HIV disease -Seen by [name] on 3/19 -Continues to have a detectable viral load, slightly low CD4 count -Continue Juluca, Retrovir -schedule follow-up with Dr. Carlyle Basques ID in 2 weeks for C. difficile colitis, HIV disease.   Essential HTN -Amlodipine 10 mg daily -Coreg 25 mg BID - Clonidine 0.1 mg BID -Doxazosin 8 mg QHS - PCP to restart any additional medication for BP if required.   ESRD on HD M/W/F -Resume regular HD schedule   Tobacco abuse -Encourage cessation.  This was discussed with the patient and should be reviewed on an ongoing basis.  He reports that he is "Quitting."  -Patch declined.      Discharge Diagnoses:  Principal Problem:   Hypotension due to hypovolemia Active Problems:   HIV disease ([place])   Essential hypertension   Tobacco abuse   ESRD on dialysis [facility])   Viral gastroenteritis   Discharge Condition: Stable  Diet recommendation: Renal  Filed Weights   10/13/17 2113 10/14/17 1330 10/14/17 1740  Weight: 245 lb 2.4 oz (111.2 kg) 248 lb 7.3 oz (112.7 kg) 243 lb 2.7 oz (110.3 kg)    History of present illness:  52 y.o. BM PMHx Tobacco dependence; STD including syphilis, gonorrhea and trichomonas, penile condylomata ; HIV on ART;  Seizure d/o (remote); HTN; HLD:  ESRD on HD W/M/F  and Chronic Diastolic CHF  with preserved EF in 9/18)    Presenting with N/V/D.  He went to HD yesterday, he got a pneumonia shot at the end and had already received one the week before. When he got home, he developed n/v/d.  He had continuous n/v/d.  His last episode was about 1-2 hours ago.  He was light-headed earlier, thinks it may be some better.  He is having episodic cramping abdominal and back pain.  He does still make urine and is continuing to do so.  No known sick contacts, but his 2yo nephew had a recent infection similarly and he did hold him for a while about 2-3 weeks ago.     During his hospitalization patient found to have C. difficile colitis will be treated with 10 days of p.o. vancomycin.  In addition diagnosed with Norovirus gastroenteritis.  Patient tolerating p.o. vancomycin as well as p.o. nutrition stable for discharge      Consultations: Nephrology  Cultures  3/26 stool positive C. difficile antigen/toxigenic C. difficile.  Negative C. difficile toxin 3/26 GI panel positive norovirus G1/G2   Antibiotics Anti-infectives (From admission, onward)   Start     Stop   10/14/17 1000  vancomycin (VANCOCIN) 50 mg/mL oral solution 125 mg     10/24/17 0959   10/13/17 1715  dolutegravir (TIVICAY) tablet 50 mg         10/13/17 1715  rilpivirine (EDURANT) tablet 25 mg         10/13/17 1700  Dolutegravir-Rilpivirine 50-25 MG TABS  1 tablet  Status:  Discontinued     10/13/17 1700   10/13/17 1700  zidovudine (RETROVIR) capsule 300 mg             Discharge Exam: Vitals:   10/14/17 1935 10/14/17 2332 10/15/17 0339 10/15/17 0806  BP: (!) 146/71 (!) 147/74 (!) 160/76 (!) 156/73  Pulse: 99   87  Resp:  13 15 18   Temp: 98.5 F (36.9 C) 99 F (37.2 C) 98.7 F (37.1 C) 98.8 F (37.1 C)  TempSrc: Oral Oral Oral Oral  SpO2:  95% 98% 98%  Weight:      Height:        General: A/O x4 No acute respiratory  distress Neck:  Negative scars, masses, torticollis, lymphadenopathy, JVD Lungs: Clear to auscultation bilaterally without wheezes or crackles Cardiovascular: Regular rate and rhythm without murmur gallop or rub normal S1 and S2 Abdomen: negative abdominal pain, nondistended, positive soft, bowel sounds, no rebound, no ascites, no appreciable mass   Discharge Instructions   Allergies as of 10/15/2017   No Known Allergies     Medication List    STOP taking these medications   ALL-IN-ONE NEBULIZER SYSTEM Misc   Blood Pressure Monitor Devi   Dolutegravir-Rilpivirine 50-25 MG Tabs Commonly known as:  JULUCA   furosemide 40 MG tablet Commonly known as:  LASIX   hydrALAZINE 100 MG tablet Commonly known as:  APRESOLINE   isosorbide dinitrate 30 MG tablet Commonly known as:  ISORDIL   lisinopril 10 MG tablet Commonly known as:  PRINIVIL,ZESTRIL   zidovudine 300 MG tablet Commonly known as:  RETROVIR Replaced by:  zidovudine 100 MG capsule     TAKE these medications   albuterol 108 (90 Base) MCG/ACT inhaler Commonly known as:  PROVENTIL HFA;VENTOLIN HFA Inhale 2 puffs into the lungs every 4 (four) hours as needed for wheezing or shortness of breath.   amLODipine 10 MG tablet Commonly known as:  NORVASC Take 1 tablet (10 mg total) by mouth daily.   buPROPion 75 MG tablet Commonly known as:  WELLBUTRIN TAKE 1/2 TABLET TWICE A DAY What changed:    how much to take  how to take this  when to take this   calcium carbonate (dosed in mg elemental calcium) 1250 MG/5ML Susp Take 5 mLs (500 mg of elemental calcium total) by mouth every 6 (six) hours as needed for indigestion.   camphor-menthol lotion Commonly known as:  SARNA Apply 1 application topically every 8 (eight) hours as needed for itching.   carvedilol 25 MG tablet Commonly known as:  COREG Take 1 tablet (25 mg total) by mouth 2 (two) times daily with a meal.   cloNIDine 0.1 MG tablet Commonly known as:   CATAPRES Take one tablet if blood pressure is 180/110 mmHg or greater. May take one pill up to twice per day. What changed:    how much to take  how to take this  when to take this  additional instructions   cyclobenzaprine 10 MG tablet Commonly known as:  FLEXERIL Take 1 tablet (10 mg total) by mouth 3 (three) times daily as needed for muscle spasms.   dolutegravir 50 MG tablet Commonly known as:  TIVICAY Take 1 tablet (50 mg total) by mouth daily with supper.   doxazosin 8 MG tablet Commonly known as:  CARDURA Take 1 tablet (8 mg total) by mouth at bedtime.   ferric citrate 1 GM 210 MG(Fe) tablet Commonly known as:  AURYXIA Take 2 tablets (420  mg total) by mouth 2 (two) times daily with a meal.   montelukast 10 MG tablet Commonly known as:  SINGULAIR Take 1 tablet (10 mg total) by mouth at bedtime.   phenylephrine 1 % nasal spray Commonly known as:  NEO-SYNEPHRINE Place 1 drop into both nostrils every 6 (six) hours as needed for congestion.   pravastatin 40 MG tablet Commonly known as:  PRAVACHOL TAKE 1 TABLET (40 MG TOTAL) BY MOUTH EVERY EVENING.   rilpivirine 25 MG Tabs tablet Commonly known as:  EDURANT Take 1 tablet (25 mg total) by mouth daily with supper.   vancomycin 50 mg/mL oral solution Commonly known as:  VANCOCIN Take 2.5 mLs (125 mg total) by mouth 4 (four) times daily.   zidovudine 100 MG capsule Commonly known as:  RETROVIR Take 3 capsules (300 mg total) by mouth daily with supper. Replaces:  zidovudine 300 MG tablet      No Known Allergies Follow-up Information    Carlyle Basques, MD. Schedule an appointment as soon as possible for a visit in 2 weeks.   Specialty:  Infectious Diseases Why:  schedule follow-up with Dr. Carlyle Basques ID in 2 weeks for C. difficile colitis, HIV disease     11/03/2017 Office will cal. Contact information: [address] [place] [address] [phone]        Clent Demark, PA-C.  Schedule an appointment as soon as possible for a visit in 1 week.   Specialty:  Physician Assistant Why:  Schedule follow-up appointment with PA Roderic Ovens 1 week Norovirus gastroenteritis Contact information: [place]  [address] [phone]            The results of significant diagnostics from this hospitalization (including imaging, microbiology, ancillary and laboratory) are listed below for reference.    Significant Diagnostic Studies: Ct Abdomen Pelvis W Contrast  Result Date: 10/13/2017 CLINICAL DATA:  Nausea and vomiting.  Renal failure. EXAM: CT ABDOMEN AND PELVIS WITH CONTRAST TECHNIQUE: Multidetector CT imaging of the abdomen and pelvis was performed using the standard protocol following bolus administration of intravenous contrast. CONTRAST:  12mL ISOVUE-300 IOPAMIDOL (ISOVUE-300) INJECTION 61% COMPARISON:  CT abdomen and pelvis September 29, 2014 and CT pelvis March 09, 2017 FINDINGS: Lower chest: There is patchy atelectasis in the right middle lobe and left base regions. There is parietal pleural calcification on the right posteriorly. There is a small hiatal hernia. Hepatobiliary: No focal liver lesions are appreciable. Gallbladder wall is not appreciably thickened. There is no biliary duct dilatation. Pancreas: No pancreatic mass or inflammatory focus. Spleen: The spleen measures 14.3 x 13.8 x 8.9 cm with a measured splenic volume of 878 cubic cm. No focal splenic lesions evident. Adrenals/Urinary Tract: Adrenals bilaterally appear normal. Kidneys are small bilaterally consistent with known chronic renal failure. There is no appreciable renal mass or hydronephrosis on either side. There is no renal or ureteral calculus on either side. Urinary bladder is midline with wall thickness within normal limits. Urinary bladder is virtually decompressed. Stomach/Bowel: There is fluid throughout most of the loops of the small bowel. There is no appreciable bowel wall or  mesenteric thickening. There is no evident bowel obstruction. No free air or portal venous air. Vascular/Lymphatic: There is atherosclerotic calcification in the aorta and common iliac arteries. There is also common femoral artery calcification bilaterally. No aneurysm evident. Major mesenteric vessels appear patent. No adenopathy is appreciable in the abdomen or pelvis. Reproductive: Prostate and seminal vesicles are normal in  size and contour. No pelvic mass evident. Other: The appendix appears normal. There is no ascites or abscess evident in the abdomen or pelvis. There is a small ventral hernia containing only fat. Musculoskeletal: There are no blastic or lytic bone lesions. There old rib fractures on the right posteriorly. There is degenerative change in the left hip joint. No intramuscular or abdominal wall lesions are evident. IMPRESSION: 1. There is fluid in most small bowel loops. This finding may be seen normally but also may represent early ileus or enteritis. No bowel obstruction evident. No abscess. Appendix appears normal. 2. Kidney somewhat atrophic consistent with chronic renal failure. No renal or ureteral calculus. No hydronephrosis. 3. Splenic enlargement. Etiology uncertain. No focal splenic lesions evident. 4.  Small hiatal hernia.  Small ventral hernia containing only fat. 5. Aortoiliac and common femoral artery atherosclerosis. No aneurysm. 6. 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 Aortic Atherosclerosis (ICD10-I70.0). Electronically Signed   By: Lowella Grip III M.D.   On: 10/13/2017 08:42   Dg Chest Port 1 View  Result Date: 10/13/2017 CLINICAL DATA:  Shortness of breath EXAM: PORTABLE CHEST 1 VIEW COMPARISON:  Chest radiograph 08/16/2017 FINDINGS: Mild cardiomegaly with mild interstitial opacity. No pleural effusion or pneumothorax. No focal consolidation. IMPRESSION: Mild cardiomegaly and mild interstitial pulmonary edema.  Electronically Signed   By: Ulyses Jarred M.D.   On: 10/13/2017 04:04    Microbiology: Recent Results (from the past 240 hour(s))  Gastrointestinal Panel by PCR , Stool     Status: Abnormal   Collection Time: 10/13/17  2:26 PM  Result Value Ref Range Status   Campylobacter species NOT DETECTED NOT DETECTED Final   Plesimonas shigelloides NOT DETECTED NOT DETECTED Final   Salmonella species NOT DETECTED NOT DETECTED Final   Yersinia enterocolitica NOT DETECTED NOT DETECTED Final   Vibrio species NOT DETECTED NOT DETECTED Final   Vibrio cholerae NOT DETECTED NOT DETECTED Final   Enteroaggregative E coli (EAEC) NOT DETECTED NOT DETECTED Final   Enteropathogenic E coli (EPEC) NOT DETECTED NOT DETECTED Final   Enterotoxigenic E coli (ETEC) NOT DETECTED NOT DETECTED Final   Shiga like toxin producing E coli (STEC) NOT DETECTED NOT DETECTED Final   Shigella/Enteroinvasive E coli (EIEC) NOT DETECTED NOT DETECTED Final   Cryptosporidium NOT DETECTED NOT DETECTED Final   Cyclospora cayetanensis NOT DETECTED NOT DETECTED Final   Entamoeba histolytica NOT DETECTED NOT DETECTED Final   Giardia lamblia NOT DETECTED NOT DETECTED Final   Adenovirus F40/41 NOT DETECTED NOT DETECTED Final   Astrovirus NOT DETECTED NOT DETECTED Final   Norovirus GI/GII DETECTED (A) NOT DETECTED Final    Comment: RESULT CALLED TO, READ BACK BY AND VERIFIED WITH: KRISTY DOTSON 10/14/17 1206 KLW    Rotavirus A NOT DETECTED NOT DETECTED Final   Sapovirus (I, II, IV, and V) NOT DETECTED NOT DETECTED Final    Comment: Performed at [facility], [address] [place], [place] [address]  C difficile quick scan w PCR reflex     Status: Abnormal   Collection Time: 10/13/17  2:26 PM  Result Value Ref Range Status   C Diff antigen POSITIVE (A) NEGATIVE Final   C Diff toxin NEGATIVE NEGATIVE Final   C Diff interpretation Results are indeterminate. See PCR results.  Final    Comment: Performed at [place], [place] [address], [place], [place] [address]  C. Diff by PCR, Reflexed     Status: Abnormal  Collection Time: 10/13/17  2:26 PM  Result Value Ref Range Status   Toxigenic C. Difficile by PCR POSITIVE (A) NEGATIVE Final    Comment: Positive for toxigenic C. difficile with little to no toxin production. Only treat if clinical presentation suggests symptomatic illness. Performed at [facility], [place] [address] [place], [place] [address]   MRSA PCR Screening     Status: None   Collection Time: 10/13/17  9:04 PM  Result Value Ref Range Status   MRSA by PCR NEGATIVE NEGATIVE Final    Comment:        The GeneXpert MRSA Assay (FDA approved for NASAL specimens only), is one component of a comprehensive MRSA colonization surveillance program. It is not intended to diagnose MRSA infection nor to guide or monitor treatment for MRSA infections. Performed at [facility], [place] [address] [place], [place] [address]      Labs: Basic Metabolic Panel: Recent Labs  Lab 10/13/17 0314 10/14/17 0243  NA 133* 130*  K 5.3* 4.8  CL 94* 90*  CO2 22 23  GLUCOSE 107* 80  BUN 40* 58*  CREATININE 9.84* 12.56*  CALCIUM 8.5* 8.0*   Liver Function Tests: Recent Labs  Lab 10/13/17 0314  AST 24  ALT 13*  ALKPHOS 101  BILITOT 0.7  PROT 8.2*  ALBUMIN 4.1   Recent Labs  Lab 10/13/17 0314  LIPASE 35   No results for input(s): AMMONIA in the last 168 hours. CBC: Recent Labs  Lab 10/13/17 0314 10/14/17 0243  WBC 9.4 5.5  NEUTROABS 7.7  --   HGB 14.3 12.1*  HCT 43.7 37.8*  MCV 100.0 100.8*  PLT 199 140*   Cardiac Enzymes: Recent Labs  Lab 10/13/17 1002 10/13/17 1800 10/13/17 2149  TROPONINI <0.03 <0.03 <0.03   BNP: BNP (last 3 results) Recent Labs    04/12/17 2100  BNP 830.9*    ProBNP (last 3 results) No results for input(s): PROBNP in the last 8760 hours.  CBG: No results for input(s): GLUCAP in the last 168 hours.     Signed:  Dia Crawford,  MD [facility] [phone] pager

## 2017-10-16 DIAGNOSIS — D631 Anemia in chronic kidney disease: Secondary | ICD-10-CM | POA: Diagnosis not present

## 2017-10-16 DIAGNOSIS — Z23 Encounter for immunization: Secondary | ICD-10-CM | POA: Diagnosis not present

## 2017-10-16 DIAGNOSIS — N186 End stage renal disease: Secondary | ICD-10-CM | POA: Diagnosis not present

## 2017-10-16 DIAGNOSIS — N2581 Secondary hyperparathyroidism of renal origin: Secondary | ICD-10-CM | POA: Diagnosis not present

## 2017-10-19 ENCOUNTER — Other Ambulatory Visit: Payer: Self-pay

## 2017-10-19 DIAGNOSIS — F17209 Nicotine dependence, unspecified, with unspecified nicotine-induced disorders: Secondary | ICD-10-CM | POA: Diagnosis not present

## 2017-10-19 DIAGNOSIS — N2581 Secondary hyperparathyroidism of renal origin: Secondary | ICD-10-CM | POA: Diagnosis not present

## 2017-10-19 DIAGNOSIS — N186 End stage renal disease: Secondary | ICD-10-CM | POA: Diagnosis not present

## 2017-10-19 DIAGNOSIS — I10 Essential (primary) hypertension: Secondary | ICD-10-CM | POA: Diagnosis not present

## 2017-10-19 DIAGNOSIS — I120 Hypertensive chronic kidney disease with stage 5 chronic kidney disease or end stage renal disease: Secondary | ICD-10-CM | POA: Diagnosis not present

## 2017-10-19 DIAGNOSIS — B20 Human immunodeficiency virus [HIV] disease: Secondary | ICD-10-CM | POA: Diagnosis not present

## 2017-10-19 DIAGNOSIS — Z992 Dependence on renal dialysis: Secondary | ICD-10-CM | POA: Diagnosis not present

## 2017-10-19 DIAGNOSIS — D631 Anemia in chronic kidney disease: Secondary | ICD-10-CM | POA: Diagnosis not present

## 2017-10-19 NOTE — Patient Outreach (Addendum)
[facility]) Care Management  [place]  10/19/2017   Darrell Johnson 1965-09-25 [id number]  Subjective: Returned patient's call for an initial assessment.  HIPAA verified.  The patient states that he lives with his wife Darrell Johnson.  He is independent with his ADLS and his wife helps him with his IADLS.  The patient states that he is able to drive himself to his appointments. The patient states that he has a blood pressure cuff and wears glasses.  The patient is adherent with his medications.  He states that he has pain he rates at a 7/10.  He states that he has not had any falls in the last year. The patient has dialysis three times a week and has his blood pressure and weight checked each time. The patient states that he has started back smoking on an occasional basis.  The patient states that he has an advanced directive but could not remember if his doctor's office has a copy.  RN Health Coach advise the patient to give the office a copy.  The patient verbalized understanding. The patient's primary is Dr Altamease Oiler.  He has an appointment on 10/22/2017.   Encounter Medications:  Outpatient Encounter Medications as of 10/19/2017  Medication Sig  . albuterol (PROVENTIL HFA;VENTOLIN HFA) 108 (90 Base) MCG/ACT inhaler Inhale 2 puffs into the lungs every 4 (four) hours as needed for wheezing or shortness of breath.  Marland Kitchen buPROPion (WELLBUTRIN) 75 MG tablet TAKE 1/2 TABLET TWICE A DAY (Patient taking differently: TAKE 37.5mg  by mouth TWICE A DAY)  . Calcium Carbonate Antacid (CALCIUM CARBONATE, DOSED IN MG ELEMENTAL CALCIUM,) 1250 MG/5ML SUSP Take 5 mLs (500 mg of elemental calcium total) by mouth every 6 (six) hours as needed for indigestion.  . camphor-menthol (SARNA) lotion Apply 1 application topically every 8 (eight) hours as needed for itching.  . carvedilol (COREG) 25 MG tablet Take 1 tablet (25 mg total) by mouth 2 (two) times daily with a meal.  . cyclobenzaprine (FLEXERIL) 10 MG  tablet Take 1 tablet (10 mg total) by mouth 3 (three) times daily as needed for muscle spasms.  . dolutegravir (TIVICAY) 50 MG tablet Take 1 tablet (50 mg total) by mouth daily with supper.  . doxazosin (CARDURA) 8 MG tablet Take 1 tablet (8 mg total) by mouth at bedtime.  . ferric citrate (AURYXIA) 1 GM 210 MG(Fe) tablet Take 2 tablets (420 mg total) by mouth 2 (two) times daily with a meal.  . phenylephrine (NEO-SYNEPHRINE) 1 % nasal spray Place 1 drop into both nostrils every 6 (six) hours as needed for congestion.  . pravastatin (PRAVACHOL) 40 MG tablet TAKE 1 TABLET (40 MG TOTAL) BY MOUTH EVERY EVENING.  . rilpivirine (EDURANT) 25 MG TABS tablet Take 1 tablet (25 mg total) by mouth daily with supper.  . vancomycin (VANCOCIN) 50 mg/mL oral solution Take 2.5 mLs (125 mg total) by mouth 4 (four) times daily.  . zidovudine (RETROVIR) 100 MG capsule Take 3 capsules (300 mg total) by mouth daily with supper.  Marland Kitchen amLODipine (NORVASC) 10 MG tablet Take 1 tablet (10 mg total) by mouth daily. (Patient not taking: Reported on 10/19/2017)  . cloNIDine (CATAPRES) 0.1 MG tablet Take one tablet if blood pressure is 180/110 mmHg or greater. May take one pill up to twice per day. (Patient not taking: Reported on 10/19/2017)  . montelukast (SINGULAIR) 10 MG tablet Take 1 tablet (10 mg total) by mouth at bedtime. (Patient not taking: Reported on 10/19/2017)  No facility-administered encounter medications on file as of 10/19/2017.     Functional Status:  In your present state of health, do you have any difficulty performing the following activities: 10/13/2017 08/11/2017  Hearing? N N  Vision? N N  Difficulty concentrating or making decisions? N N  Walking or climbing stairs? N N  Dressing or bathing? N N  Doing errands, shopping? N N  Preparing Food and eating ? - N  Using the Toilet? - N  In the past six months, have you accidently leaked urine? - N  Do you have problems with loss of bowel control? - N   Managing your Medications? - N  Managing your Finances? - N  Housekeeping or managing your Housekeeping? - N  Some recent data might be hidden    Fall/Depression Screening: Fall Risk  10/19/2017 08/11/2017 08/07/2017  Falls in the past year? No No No  Risk for fall due to : - - -   PHQ 2/9 Scores 10/19/2017 09/10/2017 08/11/2017 08/07/2017 04/06/2017 04/02/2017 03/19/2017  PHQ - 2 Score 0 0 0 0 0 0 0  PHQ- 9 Score - 4 - - - - -    Assessment: Patient will benefit from health coach outreach for disease management and support.   THN CM Care Plan Problem One     Most Recent Value  Care Plan Problem One  knowledge deficit due to disease management  Role Documenting the Problem One  Health Coach  Care Plan for Problem One  Active  THN Long Term Goal   In 90 days the patient will verbalize he has not had any Admissions to the hospital  [place] Term Goal Start Date  10/19/17  Interventions for Problem One Long Term Goal  [facility] will send the patient EMMI information to review.  THN CM Short Term Goal #1   In 30 days the patient will verbalize that he is cutting down on smoking.  THN CM Short Term Goal #1 Start Date  10/19/17  Interventions for Short Term Goal #1  [facility] talked with the patient about the benefits of quitting smoking and will send educational material  [facility] CM Short Term Goal #2   In 30 days the patient will verbalize that he is monitoring his diet  THN CM Short Term Goal #2 Start Date  10/19/17  Interventions for Short Term Goal #2  [facility] talked with the patient about his food and fluid intake.     Plan: RN Health Coach will provide ongoing education for patient on hypertension through phone calls and sending printed information to patient for further discussion.  RN Health Coach will send welcome packet with consent to patient as well as printed information on hypertension.  RN Health Coach will send initial barriers letter, assessment, and care plan to primary care physician. RN Health  Coach will contact patient in the month of May and patient agrees to next outreach.  Lazaro Arms RN, BSN, [place] Direct Dial:  [phone]  Fax: [phone]

## 2017-10-19 NOTE — Patient Outreach (Signed)
[facility]) Care Management  10/19/2017  Darrell Johnson 1965/09/30 [id number]   3 rd unsuccessful attempt to outreach the patient for initial assessment. RN Health Coach returning call.  No answer message stated that wireless customer not available.  Plan:  RN Health Coach will close the case due to unable to contact.  Lazaro Arms RN, BSN, [place] Direct Dial:  [phone]  Fax: [phone]

## 2017-10-20 ENCOUNTER — Ambulatory Visit: Payer: Self-pay

## 2017-10-20 DIAGNOSIS — I10 Essential (primary) hypertension: Secondary | ICD-10-CM | POA: Diagnosis not present

## 2017-10-20 DIAGNOSIS — B20 Human immunodeficiency virus [HIV] disease: Secondary | ICD-10-CM | POA: Diagnosis not present

## 2017-10-20 DIAGNOSIS — F17209 Nicotine dependence, unspecified, with unspecified nicotine-induced disorders: Secondary | ICD-10-CM | POA: Diagnosis not present

## 2017-10-21 DIAGNOSIS — N186 End stage renal disease: Secondary | ICD-10-CM | POA: Diagnosis not present

## 2017-10-21 DIAGNOSIS — D631 Anemia in chronic kidney disease: Secondary | ICD-10-CM | POA: Diagnosis not present

## 2017-10-21 DIAGNOSIS — N2581 Secondary hyperparathyroidism of renal origin: Secondary | ICD-10-CM | POA: Diagnosis not present

## 2017-10-22 ENCOUNTER — Other Ambulatory Visit: Payer: Self-pay

## 2017-10-22 ENCOUNTER — Ambulatory Visit (INDEPENDENT_AMBULATORY_CARE_PROVIDER_SITE_OTHER): Payer: Medicare Other | Admitting: Physician Assistant

## 2017-10-22 ENCOUNTER — Encounter (INDEPENDENT_AMBULATORY_CARE_PROVIDER_SITE_OTHER): Payer: Self-pay | Admitting: Physician Assistant

## 2017-10-22 VITALS — BP 146/81 | HR 84 | Temp 98.2°F | Ht 69.0 in | Wt 249.0 lb

## 2017-10-22 DIAGNOSIS — Z09 Encounter for follow-up examination after completed treatment for conditions other than malignant neoplasm: Secondary | ICD-10-CM

## 2017-10-22 DIAGNOSIS — Z760 Encounter for issue of repeat prescription: Secondary | ICD-10-CM

## 2017-10-22 DIAGNOSIS — E7841 Elevated Lipoprotein(a): Secondary | ICD-10-CM | POA: Diagnosis not present

## 2017-10-22 DIAGNOSIS — I10 Essential (primary) hypertension: Secondary | ICD-10-CM | POA: Diagnosis not present

## 2017-10-22 MED ORDER — AMLODIPINE BESYLATE 10 MG PO TABS: 10.0000 mg | ORAL_TABLET | Freq: Every day | ORAL | 3 refills | Status: DC

## 2017-10-22 MED ORDER — IPRATROPIUM-ALBUTEROL 0.5-2.5 (3) MG/3ML IN SOLN: 3.0000 mL | Freq: Four times a day (QID) | RESPIRATORY_TRACT | 1 refills | Status: DC | PRN

## 2017-10-22 NOTE — Progress Notes (Signed)
Subjective:  Patient ID: Darrell Johnson, male    DOB: April 07, 1966  Age: 52 y.o. MRN: [id number]  CC: hospital f/u  HPI Darrell Johnson a 52 y.o.malewith a medical history of anemia, anxiety, asthma, CHF, ESRD, Gout, Syphillis, HIV, HTN, HLD, PNA, rib fractures, seizures, tobacco abuse, and chronic sphenoidal sinusitispresentsto f/u on hospital visit. Admitted 10/13/17 and discharged on 10/15/17. Hospitalized due to hypotension 2/2 volume loss from viral gastroenteritis by Norovirus. States he is completely recuperated with no symptoms. Says hospital stopped his anti-hypertensives and he does not know what to take anymore. Does not endorse CP, palpitations, SOB, HA, abdominal pain, tingling, numbness, f/c/n/v, presyncope, or GI/GU sxs. Requests a refill of "fluid for asthma machine".   Outpatient Medications Prior to Visit  Medication Sig Dispense Refill  . albuterol (PROVENTIL HFA;VENTOLIN HFA) 108 (90 Base) MCG/ACT inhaler Inhale 2 puffs into the lungs every 4 (four) hours as needed for wheezing or shortness of breath. 1 Inhaler 5  . Calcium Carbonate Antacid (CALCIUM CARBONATE, DOSED IN MG ELEMENTAL CALCIUM,) 1250 MG/5ML SUSP Take 5 mLs (500 mg of elemental calcium total) by mouth every 6 (six) hours as needed for indigestion. 450 mL 0  . camphor-menthol (SARNA) lotion Apply 1 application topically every 8 (eight) hours as needed for itching. 222 mL 0  . carvedilol (COREG) 25 MG tablet Take 1 tablet (25 mg total) by mouth 2 (two) times daily with a meal. 180 tablet 3  . cloNIDine (CATAPRES) 0.1 MG tablet Take one tablet if blood pressure is 180/110 mmHg or greater. May take one pill up to twice per day. 60 tablet 3  . cyclobenzaprine (FLEXERIL) 10 MG tablet Take 1 tablet (10 mg total) by mouth 3 (three) times daily as needed for muscle spasms. 30 tablet 0  . dolutegravir (TIVICAY) 50 MG tablet Take 1 tablet (50 mg total) by mouth daily with supper. 30 tablet 0  . montelukast (SINGULAIR)  10 MG tablet Take 1 tablet (10 mg total) by mouth at bedtime. 30 tablet 2  . rilpivirine (EDURANT) 25 MG TABS tablet Take 1 tablet (25 mg total) by mouth daily with supper. 30 tablet 0  . zidovudine (RETROVIR) 100 MG capsule Take 3 capsules (300 mg total) by mouth daily with supper. 90 capsule 0  . amLODipine (NORVASC) 10 MG tablet Take 1 tablet (10 mg total) by mouth daily. (Patient not taking: Reported on 10/19/2017) 90 tablet 3  . buPROPion (WELLBUTRIN) 75 MG tablet TAKE 1/2 TABLET TWICE A DAY (Patient not taking: Reported on 10/22/2017) 60 tablet 1  . doxazosin (CARDURA) 8 MG tablet Take 1 tablet (8 mg total) by mouth at bedtime. 90 tablet 3  . ferric citrate (AURYXIA) 1 GM 210 MG(Fe) tablet Take 2 tablets (420 mg total) by mouth 2 (two) times daily with a meal. 120 tablet 0  . phenylephrine (NEO-SYNEPHRINE) 1 % nasal spray Place 1 drop into both nostrils every 6 (six) hours as needed for congestion. (Patient not taking: Reported on 10/22/2017) 30 mL 0  . pravastatin (PRAVACHOL) 40 MG tablet TAKE 1 TABLET (40 MG TOTAL) BY MOUTH EVERY EVENING. (Patient not taking: Reported on 10/22/2017) 90 tablet 3  . vancomycin (VANCOCIN) 50 mg/mL oral solution Take 2.5 mLs (125 mg total) by mouth 4 (four) times daily. (Patient not taking: Reported on 10/22/2017) 100 mL 0   No facility-administered medications prior to visit.      ROS Review of Systems  Constitutional: Negative for chills, fever and malaise/fatigue.  Eyes:  Negative for blurred vision.  Respiratory: Negative for shortness of breath.   Cardiovascular: Negative for chest pain and palpitations.  Gastrointestinal: Negative for abdominal pain and nausea.  Genitourinary: Negative for dysuria and hematuria.  Musculoskeletal: Negative for joint pain and myalgias.  Skin: Negative for rash.  Neurological: Negative for tingling and headaches.  Psychiatric/Behavioral: Negative for depression. The patient is not nervous/anxious.     Objective:  BP (!)  146/81 (BP Location: Left Arm, Patient Position: Sitting, Cuff Size: Large)   Pulse 84   Temp 98.2 F (36.8 C) (Oral)   Ht 5\' 9"  (1.753 m)   Wt 249 lb (112.9 kg)   SpO2 98%   BMI 36.77 kg/m   BP/Weight 10/22/2017 10/15/2017 5/80/9983  Systolic BP 382 505 -  Diastolic BP 81 73 -  Wt. (Lbs) 249 - 243.17  BMI 36.77 - 35.91      Physical Exam  Constitutional: He is oriented to person, place, and time.  Well developed, well nourished, NAD, polite  HENT:  Head: Normocephalic and atraumatic.  Eyes: No scleral icterus.  Neck: Normal range of motion. Neck supple. No thyromegaly present.  Cardiovascular: Normal rate, regular rhythm and normal heart sounds.  Pulmonary/Chest: Effort normal and breath sounds normal.  Abdominal: Soft. Bowel sounds are normal. There is no tenderness.  Musculoskeletal: He exhibits no edema.  Neurological: He is alert and oriented to person, place, and time.  Skin: Skin is warm and dry. No rash noted. No erythema. No pallor.  Psychiatric: He has a normal mood and affect. His behavior is normal. Thought content normal.  Vitals reviewed.    Assessment & Plan:    1. Hypertension, unspecified type - Restart amLODipine (NORVASC) 10 MG tablet; Take 1 tablet (10 mg total) by mouth daily.  Dispense: 90 tablet; Refill: 3  2. Hospital discharge follow-up - Norovirus infection. Pt asymptomatic.  - Comprehensive metabolic panel; Future  3. Elevated lipoprotein(a) - Lipid panel; Future  4. Medication refill - ipratropium-albuterol (DUONEB) 0.5-2.5 (3) MG/3ML SOLN; Take 3 mLs by nebulization every 6 (six) hours as needed.  Dispense: 360 mL; Refill: 1   Meds ordered this encounter  Medications  . amLODipine (NORVASC) 10 MG tablet    Sig: Take 1 tablet (10 mg total) by mouth daily.    Dispense:  90 tablet    Refill:  3    Order Specific Question:   Supervising Provider    Answer:   Tresa Garter [id number]  . ipratropium-albuterol (DUONEB) 0.5-2.5 (3)  MG/3ML SOLN    Sig: Take 3 mLs by nebulization every 6 (six) hours as needed.    Dispense:  360 mL    Refill:  1    Order Specific Question:   Supervising Provider    Answer:   Tresa Garter [[id number]]    Follow-up: Return in about 1 month (around 11/19/2017) for HTN.   Clent Demark PA

## 2017-10-22 NOTE — Patient Instructions (Signed)
Managing Your Hypertension Hypertension is commonly called high blood pressure. This is when the force of your blood pressing against the walls of your arteries is too strong. Arteries are blood vessels that carry blood from your heart throughout your body. Hypertension forces the heart to work harder to pump blood, and may cause the arteries to become narrow or stiff. Having untreated or uncontrolled hypertension can cause heart attack, stroke, kidney disease, and other problems. What are blood pressure readings? A blood pressure reading consists of a higher number over a lower number. Ideally, your blood pressure should be below 120/80. The first ("top") number is called the systolic pressure. It is a measure of the pressure in your arteries as your heart beats. The second ("bottom") number is called the diastolic pressure. It is a measure of the pressure in your arteries as the heart relaxes. What does my blood pressure reading mean? Blood pressure is classified into four stages. Based on your blood pressure reading, your health care provider may use the following stages to determine what type of treatment you need, if any. Systolic pressure and diastolic pressure are measured in a unit called mm Hg. Normal  Systolic pressure: below 120.  Diastolic pressure: below 80. Elevated  Systolic pressure: 120-129.  Diastolic pressure: below 80. Hypertension stage 1  Systolic pressure: 130-139.  Diastolic pressure: 80-89. Hypertension stage 2  Systolic pressure: 140 or above.  Diastolic pressure: 90 or above. What health risks are associated with hypertension? Managing your hypertension is an important responsibility. Uncontrolled hypertension can lead to:  A heart attack.  A stroke.  A weakened blood vessel (aneurysm).  Heart failure.  Kidney damage.  Eye damage.  Metabolic syndrome.  Memory and concentration problems.  What changes can I make to manage my  hypertension? Hypertension can be managed by making lifestyle changes and possibly by taking medicines. Your health care provider will help you make a plan to bring your blood pressure within a normal range. Eating and drinking  Eat a diet that is high in fiber and potassium, and low in salt (sodium), added sugar, and fat. An example eating plan is called the DASH (Dietary Approaches to Stop Hypertension) diet. To eat this way: ? Eat plenty of fresh fruits and vegetables. Try to fill half of your plate at each meal with fruits and vegetables. ? Eat whole grains, such as whole wheat pasta, brown rice, or whole grain bread. Fill about one quarter of your plate with whole grains. ? Eat low-fat diary products. ? Avoid fatty cuts of meat, processed or cured meats, and poultry with skin. Fill about one quarter of your plate with lean proteins such as fish, chicken without skin, beans, eggs, and tofu. ? Avoid premade and processed foods. These tend to be higher in sodium, added sugar, and fat.  Reduce your daily sodium intake. Most people with hypertension should eat less than 1,500 mg of sodium a day.  Limit alcohol intake to no more than 1 drink a day for nonpregnant women and 2 drinks a day for men. One drink equals 12 oz of beer, 5 oz of wine, or 1 oz of hard liquor. Lifestyle  Work with your health care provider to maintain a healthy body weight, or to lose weight. Ask what an ideal weight is for you.  Get at least 30 minutes of exercise that causes your heart to beat faster (aerobic exercise) most days of the week. Activities may include walking, swimming, or biking.  Include exercise   to strengthen your muscles (resistance exercise), such as weight lifting, as part of your weekly exercise routine. Try to do these types of exercises for 30 minutes at least 3 days a week.  Do not use any products that contain nicotine or tobacco, such as cigarettes and e-cigarettes. If you need help quitting, ask  your health care provider.  Control any long-term (chronic) conditions you have, such as high cholesterol or diabetes. Monitoring  Monitor your blood pressure at home as told by your health care provider. Your personal target blood pressure may vary depending on your medical conditions, your age, and other factors.  Have your blood pressure checked regularly, as often as told by your health care provider. Working with your health care provider  Review all the medicines you take with your health care provider because there may be side effects or interactions.  Talk with your health care provider about your diet, exercise habits, and other lifestyle factors that may be contributing to hypertension.  Visit your health care provider regularly. Your health care provider can help you create and adjust your plan for managing hypertension. Will I need medicine to control my blood pressure? Your health care provider may prescribe medicine if lifestyle changes are not enough to get your blood pressure under control, and if:  Your systolic blood pressure is 130 or higher.  Your diastolic blood pressure is 80 or higher.  Take medicines only as told by your health care provider. Follow the directions carefully. Blood pressure medicines must be taken as prescribed. The medicine does not work as well when you skip doses. Skipping doses also puts you at risk for problems. Contact a health care provider if:  You think you are having a reaction to medicines you have taken.  You have repeated (recurrent) headaches.  You feel dizzy.  You have swelling in your ankles.  You have trouble with your vision. Get help right away if:  You develop a severe headache or confusion.  You have unusual weakness or numbness, or you feel faint.  You have severe pain in your chest or abdomen.  You vomit repeatedly.  You have trouble breathing. Summary  Hypertension is when the force of blood pumping through  your arteries is too strong. If this condition is not controlled, it may put you at risk for serious complications.  Your personal target blood pressure may vary depending on your medical conditions, your age, and other factors. For most people, a normal blood pressure is less than 120/80.  Hypertension is managed by lifestyle changes, medicines, or both. Lifestyle changes include weight loss, eating a healthy, low-sodium diet, exercising more, and limiting alcohol. This information is not intended to replace advice given to you by your health care provider. Make sure you discuss any questions you have with your health care provider. Document Released: 03/31/2012 Document Revised: 06/04/2016 Document Reviewed: 06/04/2016 Elsevier Interactive Patient Education  2018 Elsevier Inc.  

## 2017-10-23 DIAGNOSIS — D631 Anemia in chronic kidney disease: Secondary | ICD-10-CM | POA: Diagnosis not present

## 2017-10-23 DIAGNOSIS — N186 End stage renal disease: Secondary | ICD-10-CM | POA: Diagnosis not present

## 2017-10-23 DIAGNOSIS — N2581 Secondary hyperparathyroidism of renal origin: Secondary | ICD-10-CM | POA: Diagnosis not present

## 2017-10-26 DIAGNOSIS — N2581 Secondary hyperparathyroidism of renal origin: Secondary | ICD-10-CM | POA: Diagnosis not present

## 2017-10-26 DIAGNOSIS — N186 End stage renal disease: Secondary | ICD-10-CM | POA: Diagnosis not present

## 2017-10-26 DIAGNOSIS — D631 Anemia in chronic kidney disease: Secondary | ICD-10-CM | POA: Diagnosis not present

## 2017-10-28 ENCOUNTER — Inpatient Hospital Stay (INDEPENDENT_AMBULATORY_CARE_PROVIDER_SITE_OTHER): Payer: Medicare Other | Admitting: Physician Assistant

## 2017-10-28 DIAGNOSIS — N186 End stage renal disease: Secondary | ICD-10-CM | POA: Diagnosis not present

## 2017-10-28 DIAGNOSIS — D631 Anemia in chronic kidney disease: Secondary | ICD-10-CM | POA: Diagnosis not present

## 2017-10-28 DIAGNOSIS — N2581 Secondary hyperparathyroidism of renal origin: Secondary | ICD-10-CM | POA: Diagnosis not present

## 2017-10-30 ENCOUNTER — Other Ambulatory Visit (INDEPENDENT_AMBULATORY_CARE_PROVIDER_SITE_OTHER): Payer: Self-pay | Admitting: Physician Assistant

## 2017-10-30 DIAGNOSIS — N2581 Secondary hyperparathyroidism of renal origin: Secondary | ICD-10-CM | POA: Diagnosis not present

## 2017-10-30 DIAGNOSIS — N186 End stage renal disease: Secondary | ICD-10-CM | POA: Diagnosis not present

## 2017-10-30 DIAGNOSIS — D631 Anemia in chronic kidney disease: Secondary | ICD-10-CM | POA: Diagnosis not present

## 2017-10-30 DIAGNOSIS — I10 Essential (primary) hypertension: Secondary | ICD-10-CM

## 2017-10-30 NOTE — Telephone Encounter (Signed)
FWD to PCP. Darrell Johnson, CMA  

## 2017-11-02 DIAGNOSIS — N186 End stage renal disease: Secondary | ICD-10-CM | POA: Diagnosis not present

## 2017-11-02 DIAGNOSIS — N2581 Secondary hyperparathyroidism of renal origin: Secondary | ICD-10-CM | POA: Diagnosis not present

## 2017-11-02 DIAGNOSIS — D631 Anemia in chronic kidney disease: Secondary | ICD-10-CM | POA: Diagnosis not present

## 2017-11-03 ENCOUNTER — Ambulatory Visit (INDEPENDENT_AMBULATORY_CARE_PROVIDER_SITE_OTHER): Payer: Medicare Other | Admitting: Internal Medicine

## 2017-11-03 ENCOUNTER — Encounter: Payer: Self-pay | Admitting: Internal Medicine

## 2017-11-03 ENCOUNTER — Telehealth: Payer: Self-pay | Admitting: Behavioral Health

## 2017-11-03 VITALS — BP 171/94 | HR 96 | Temp 98.4°F | Ht 69.0 in | Wt 249.0 lb

## 2017-11-03 DIAGNOSIS — B20 Human immunodeficiency virus [HIV] disease: Secondary | ICD-10-CM

## 2017-11-03 DIAGNOSIS — I871 Compression of vein: Secondary | ICD-10-CM | POA: Diagnosis not present

## 2017-11-03 DIAGNOSIS — I10 Essential (primary) hypertension: Secondary | ICD-10-CM | POA: Diagnosis not present

## 2017-11-03 DIAGNOSIS — Z992 Dependence on renal dialysis: Secondary | ICD-10-CM | POA: Diagnosis not present

## 2017-11-03 DIAGNOSIS — T82858A Stenosis of vascular prosthetic devices, implants and grafts, initial encounter: Secondary | ICD-10-CM | POA: Diagnosis not present

## 2017-11-03 DIAGNOSIS — N186 End stage renal disease: Secondary | ICD-10-CM | POA: Diagnosis not present

## 2017-11-03 MED ORDER — ZIDOVUDINE 300 MG PO TABS
300.0000 mg | ORAL_TABLET | Freq: Every day | ORAL | 5 refills | Status: DC
Start: 2017-11-03 — End: 2018-12-29

## 2017-11-03 MED ORDER — DOLUTEGRAVIR-RILPIVIRINE 50-25 MG PO TABS: 1.0000 | ORAL_TABLET | Freq: Every day | ORAL | 5 refills | Status: DC

## 2017-11-03 MED FILL — ZIDOVUDINE 300 MG TABLET: 300 | 30 days supply | Qty: 30 | Fill #2

## 2017-11-03 MED FILL — JULUCA 50-25 MG TAB: 50-25 | 30 days supply | Qty: 30 | Fill #2

## 2017-11-03 NOTE — Telephone Encounter (Signed)
[facility] to cancel 3 prescriptions per Dr. Storm Frisk verbal order.   Informed staff member to cancel RX for Zidovudine, Tivicay, and Edurant.  Patient will now be getting those prescriptions from [facility].  Staff member verbalized understanding and states that it was take care of.  No further questions. Pricilla Riffle RN

## 2017-11-03 NOTE — Patient Instructions (Signed)
Continue taking  juluca 1 tab daily plus retrovir 300mg  tablet  Daily with dinner

## 2017-11-03 NOTE — Progress Notes (Signed)
RFV: follow up for hiv disease  Patient ID: Darrell Johnson, male   DOB: [birth year], 52 y.o.   MRN: [id number]  HPI  Darrell Johnson with ESRD on HD, HIV disease, CD 4 count of 390/VL 66, currently on DLG-RPV (as juluca) plus ZDV. He was hospitalized at the end of march for N/V/D with diarrhea work up showing + norovirus but also was Cdifficile PCR +GDH+ toxin -, and discharged with 10 d course of oral vancomycin for which he finished and has resolved diarrhea. He is being evaluated for renal transplant, follows with cam wolfe at [facility], and transplant team. Darrell Johnson has been working with their clinic on stopping smoking. He is down to 1 or 1/2 cigs per day to continue to be on transplant list.  It appears that when he was hospitalized for diarrheal illness, he was also discharged on antitretrovirals but indvidual components.  Outpatient Encounter Medications as of 11/03/2017  Medication Sig  . albuterol (PROVENTIL HFA;VENTOLIN HFA) 108 (90 Base) MCG/ACT inhaler Inhale 2 puffs into the lungs every 4 (four) hours as needed for wheezing or shortness of breath.  Marland Kitchen amLODipine (NORVASC) 10 MG tablet Take 1 tablet (10 mg total) by mouth daily.  . Calcium Carbonate Antacid (CALCIUM CARBONATE, DOSED IN MG ELEMENTAL CALCIUM,) 1250 MG/5ML SUSP Take 5 mLs (500 mg of elemental calcium total) by mouth every 6 (six) hours as needed for indigestion.  . camphor-menthol (SARNA) lotion Apply 1 application topically every 8 (eight) hours as needed for itching.  . carvedilol (COREG) 25 MG tablet Take 1 tablet (25 mg total) by mouth 2 (two) times daily with a meal.  . cloNIDine (CATAPRES) 0.1 MG tablet TAKE ONE TABLET IF BLOOD PRESSURE IS 180/110 MMHG OR GREATER. MAY TAKE ONE PILL UP TO TWICE PER DAY.  . cyclobenzaprine (FLEXERIL) 10 MG tablet Take 1 tablet (10 mg total) by mouth 3 (three) times daily as needed for muscle spasms.  . dolutegravir (TIVICAY) 50 MG tablet Take 1 tablet (50 mg total) by mouth daily with supper.    . doxazosin (CARDURA) 8 MG tablet Take 1 tablet (8 mg total) by mouth at bedtime.  . ferric citrate (AURYXIA) 1 GM 210 MG(Fe) tablet Take 2 tablets (420 mg total) by mouth 2 (two) times daily with a meal.  . ipratropium-albuterol (DUONEB) 0.5-2.5 (3) MG/3ML SOLN Take 3 mLs by nebulization every 6 (six) hours as needed.  . montelukast (SINGULAIR) 10 MG tablet Take 1 tablet (10 mg total) by mouth at bedtime.  . pravastatin (PRAVACHOL) 40 MG tablet TAKE 1 TABLET (40 MG TOTAL) BY MOUTH EVERY EVENING. (Patient not taking: Reported on 10/22/2017)  . rilpivirine (EDURANT) 25 MG TABS tablet Take 1 tablet (25 mg total) by mouth daily with supper.  . zidovudine (RETROVIR) 100 MG capsule Take 3 capsules (300 mg total) by mouth daily with supper.   No facility-administered encounter medications on file as of 11/03/2017.      Patient Active Problem List   Diagnosis Date Noted  . Hypotension due to hypovolemia 10/13/2017  . Viral gastroenteritis 10/13/2017  . Moderate mitral regurgitation   . Acute exacerbation of CHF (congestive heart failure) ([place]) 04/12/2017  . Acute respiratory failure with hypoxia ([place]) 03/29/2017  . Scrotal abscess 03/09/2017  . Trigger finger of right hand 09/11/2016  . Abscess of groin, right 09/04/2016  . Hyperlipidemia 03/31/2016  . Anxiety state 03/11/2016  . Lumbar radiculopathy 01/28/2016  . Seasonal allergies   . Healthcare maintenance 05/03/2015  . Anemia in chronic  kidney disease 08/30/2014  . ESRD on dialysis ([place]) 12/27/2013  . Drug noncompliance 11/07/2013  . History of syphilis 11/07/2013  . Arthritis 09/09/2013  . Tobacco abuse 09/07/2013  . Flash pulmonary edema ([place]) 06/11/2013  . Chronic pain disorder 04/28/2013  . HYPERTRIGLYCERIDEMIA 11/01/2009  . Gout 08/13/2009  . ERECTILE DYSFUNCTION 06/08/2008  . HIV disease ([place]) 05/08/2006  . PERIPHERAL NEUROPATHY 05/08/2006  . Essential hypertension 05/08/2006  . SEIZURE DISORDER 05/08/2006     There are  no preventive care reminders to display for this patient.   Review of Systems  Physical Exam  BP (!) 171/94   Pulse 96   Temp 98.4 F (36.9 C) (Oral)   Ht 5\' 9"  (1.753 m)   Wt 249 lb (112.9 kg)   BMI 36.77 kg/m  Physical Exam  Constitutional: He is oriented to person, place, and time. He appears well-developed and well-nourished. No distress.  HENT:  Mouth/Throat: Oropharynx is clear and moist. No oropharyngeal exudate.  Cardiovascular: Normal rate, regular rhythm and normal heart sounds. Exam reveals no gallop and no friction rub.  No murmur heard.  Pulmonary/Chest: Effort normal and breath sounds normal. No respiratory distress. He has no wheezes.  Ext: right arm fistula bandaged Lymphadenopathy:  He has no cervical adenopathy.  Neurological: He is alert and oriented to person, place, and time.  Skin: Skin is warm and dry. No rash noted. No erythema.  Psychiatric: He has a normal mood and affect. His behavior is normal.    Lab Results  Component Value Date   CD4TCELL 32 (L) 08/27/2017   Lab Results  Component Value Date   CD4TABS 390 (L) 08/27/2017   CD4TABS 600 10/01/2016   CD4TABS 570 09/24/2015   Lab Results  Component Value Date   HIV1RNAQUANT 66 (H) 08/27/2017   Lab Results  Component Value Date   HEPBSAB POS (A) 08/17/2014   Lab Results  Component Value Date   LABRPR NON REAC 10/01/2016    CBC Lab Results  Component Value Date   WBC 5.5 10/14/2017   RBC 3.75 (L) 10/14/2017   HGB 12.1 (L) 10/14/2017   HCT 37.8 (L) 10/14/2017   PLT 140 (L) 10/14/2017   MCV 100.8 (H) 10/14/2017   MCH 32.3 10/14/2017   MCHC 32.0 10/14/2017   RDW 18.9 (H) 10/14/2017   LYMPHSABS 0.5 (L) 10/13/2017   MONOABS 0.9 10/13/2017   EOSABS 0.3 10/13/2017    BMET Lab Results  Component Value Date   NA 130 (L) 10/14/2017   K 4.8 10/14/2017   CL 90 (L) 10/14/2017   CO2 23 10/14/2017   GLUCOSE 80 10/14/2017   BUN 58 (H) 10/14/2017   CREATININE 12.56 (H) 10/14/2017    CALCIUM 8.0 (L) 10/14/2017   GFRNONAA 4 (L) 10/14/2017   GFRAA 5 (L) 10/14/2017      Assessment and Plan  hiv disease = he continues to be adherent to his regimen. We have clarified his [facility] list, and confirmed that he is only taking juluca plus zidovudine.  Smoking cessation = continue with encourage great progress he is making, goal to quit in the coming month  htn = continue on current regimen  esrd on hd = continues to be evaulated for transplant

## 2017-11-03 NOTE — Progress Notes (Signed)
HPI: Darrell Johnson is a 52 y.o. male who is here to f/u with Dr. Baxter Johnson for his HIV.   Allergies: No Known Allergies  Vitals: Temp: 98.4 F (36.9 C) (04/16 1449) Temp Source: Oral (04/16 1449) BP: 171/94 (04/16 1449) Pulse Rate: 96 (04/16 1449)  Past Medical History: Past Medical History:  Diagnosis Date  . Anemia   . Anxiety   . Arthritis    "knees" (04/13/2017)  . Asthma   . CHF (congestive heart failure) ([place])   . ESRD (end stage renal disease) on dialysis [facility])    "MWF; [facility]" (04/13/2017)  . Gout, unspecified 08/13/2009   Qualifier: Diagnosis of  By: Darrell Pulling  MD, Darrell Johnson    . H1N1 influenza    March 2016  . History of blood transfusion 02/2017   "related to OR"  . HIV infection ([place]) 1980's   on ART therapy since, followed by ID clinic, complicated  by neuropathy  . Hyperlipidemia    hypertrygliceridemia determined ti be secondary to ART therpay  . HYPERTENSION 05/08/2006  . Pneumonia    "once; years ago" (04/13/2017)  . Rib fractures 01/2009  . Seizures ([place])    last seizure was in the 1990s, pt has family history of seizures; "probably related to alcohol" (04/13/2017)  . 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 Syphilis 1997   history of syphilis 1997  . Tobacco abuse     Social History: Social History   Socioeconomic History  . Marital status: Married    Spouse name: Not on file  . Number of children: Not on file  . Years of education: Not on file  . Highest education level: Not on file  Occupational History  . Occupation: details cars  Social Needs  . Financial resource strain: Not on file  . Food insecurity:    Worry: Not on file    Inability: Not on file  . Transportation needs:    Medical: Not on file    Non-medical: Not on file  Tobacco Use  . Smoking status: Former Smoker    Packs/day: 0.10   Years: 25.00    Pack years: 2.50    Types: Cigarettes    Last attempt to quit: 10/19/2017    Years since quitting: 0.0  . Smokeless tobacco: Never Used  . Tobacco comment: "quitting"  Substance and Sexual Activity  . Alcohol use: No    Alcohol/week: 0.0 oz    Comment: 04/13/2017 "quit ~ 2014"  . Drug use: No  . Sexual activity: Never    Partners: Female    Comment: given condoms  Lifestyle  . Physical activity:    Days per week: Not on file    Minutes per session: Not on file  . Stress: Not on file  Relationships  . Social connections:    Talks on phone: Not on file    Gets together: Not on file    Attends religious service: Not on file    Active member of club or organization: Not on file    Attends meetings of clubs or organizations: Not on file    Relationship status: Not on file  Other Topics Concern  . Not on file  Social History Narrative   ** Merged History Encounter **        Previous Regimen: Kaletra/cobivir/tenofovir>>Kaletra/combivir/isentress>>Darunavir/r/combivir/Isentress>>Prezcobix/zidovudine/Viread  Current Regimen: Juluca/zidovudine  Labs:  HIV 1 RNA Quant (copies/mL)  Date Value  08/27/2017 66 (H)  10/01/2016 28 (H)  09/24/2015 <20   CD4 T Cell Abs (/uL)  Date Value  08/27/2017 390 (L)  10/01/2016 600  09/24/2015 570   Hep B S Ab (no units)  Date Value  08/17/2014 POS (A)   Hepatitis B Surface Ag (no units)  Date Value  06/17/2016 Negative   HCV Ab (no units)  Date Value  09/14/2006 NO    CrCl: Estimated Creatinine Clearance: 8.5 mL/min (A) (by C-G formula based on SCr of 12.56 mg/dL (H)).  Lipids:    Component Value Date/Time   CHOL 121 (L) 09/24/2015 1103   TRIG 99 09/24/2015 1103   HDL 40 09/24/2015 1103   CHOLHDL 3.0 09/24/2015 1103   VLDL 20 09/24/2015 1103   LDLCALC 61 09/24/2015 1103    Assessment: Darrell Johnson is an ESRD pt who was recently admitted for clidff. During admission, his therapy was split up because the  hospital didn't have the Juluca that he is on or the stregth of his AZT. Therefore, he was on RPV/DTG/AZT while there. Previously, he has been getting his meds at [facility]. All new scripts sent to [facility] today and they will ship it out to him. He will get it tomorrow. His last dose of Juluca was yesterday.   Recommendations:  Sent Juluca and AZT to [facility], PharmD, BCPS, AAHIVP, CPP Clinical Infectious Ali Darrell Johnson for Infectious Disease 11/03/2017, 4:08 PM

## 2017-11-04 DIAGNOSIS — N186 End stage renal disease: Secondary | ICD-10-CM | POA: Diagnosis not present

## 2017-11-04 DIAGNOSIS — N2581 Secondary hyperparathyroidism of renal origin: Secondary | ICD-10-CM | POA: Diagnosis not present

## 2017-11-04 DIAGNOSIS — D631 Anemia in chronic kidney disease: Secondary | ICD-10-CM | POA: Diagnosis not present

## 2017-11-04 LAB — T-HELPER CELL (CD4) - (RCID CLINIC ONLY)
CD4 T CELL ABS: 670 /uL (ref 400–2700)
CD4 T CELL HELPER: 36 % (ref 33–55)

## 2017-11-05 LAB — HIV-1 RNA QUANT-NO REFLEX-BLD
HIV 1 RNA QUANT: 48 {copies}/mL — AB
HIV-1 RNA QUANT, LOG: 1.68 {Log_copies}/mL — AB

## 2017-11-06 ENCOUNTER — Other Ambulatory Visit (INDEPENDENT_AMBULATORY_CARE_PROVIDER_SITE_OTHER): Payer: Self-pay | Admitting: Physician Assistant

## 2017-11-06 DIAGNOSIS — N2581 Secondary hyperparathyroidism of renal origin: Secondary | ICD-10-CM | POA: Diagnosis not present

## 2017-11-06 DIAGNOSIS — D631 Anemia in chronic kidney disease: Secondary | ICD-10-CM | POA: Diagnosis not present

## 2017-11-06 DIAGNOSIS — I10 Essential (primary) hypertension: Secondary | ICD-10-CM

## 2017-11-06 DIAGNOSIS — N186 End stage renal disease: Secondary | ICD-10-CM | POA: Diagnosis not present

## 2017-11-09 DIAGNOSIS — N2581 Secondary hyperparathyroidism of renal origin: Secondary | ICD-10-CM | POA: Diagnosis not present

## 2017-11-09 DIAGNOSIS — N186 End stage renal disease: Secondary | ICD-10-CM | POA: Diagnosis not present

## 2017-11-09 DIAGNOSIS — D631 Anemia in chronic kidney disease: Secondary | ICD-10-CM | POA: Diagnosis not present

## 2017-11-09 NOTE — Telephone Encounter (Signed)
FWD to PCP. Tempestt S Roberts, CMA  

## 2017-11-11 DIAGNOSIS — D631 Anemia in chronic kidney disease: Secondary | ICD-10-CM | POA: Diagnosis not present

## 2017-11-11 DIAGNOSIS — N186 End stage renal disease: Secondary | ICD-10-CM | POA: Diagnosis not present

## 2017-11-11 DIAGNOSIS — N2581 Secondary hyperparathyroidism of renal origin: Secondary | ICD-10-CM | POA: Diagnosis not present

## 2017-11-12 DIAGNOSIS — N2581 Secondary hyperparathyroidism of renal origin: Secondary | ICD-10-CM | POA: Diagnosis not present

## 2017-11-12 DIAGNOSIS — N186 End stage renal disease: Secondary | ICD-10-CM | POA: Diagnosis not present

## 2017-11-12 DIAGNOSIS — D631 Anemia in chronic kidney disease: Secondary | ICD-10-CM | POA: Diagnosis not present

## 2017-11-16 DIAGNOSIS — N186 End stage renal disease: Secondary | ICD-10-CM | POA: Diagnosis not present

## 2017-11-16 DIAGNOSIS — D631 Anemia in chronic kidney disease: Secondary | ICD-10-CM | POA: Diagnosis not present

## 2017-11-16 DIAGNOSIS — N2581 Secondary hyperparathyroidism of renal origin: Secondary | ICD-10-CM | POA: Diagnosis not present

## 2017-11-18 DIAGNOSIS — D631 Anemia in chronic kidney disease: Secondary | ICD-10-CM | POA: Diagnosis not present

## 2017-11-18 DIAGNOSIS — Z992 Dependence on renal dialysis: Secondary | ICD-10-CM | POA: Diagnosis not present

## 2017-11-18 DIAGNOSIS — E8770 Fluid overload, unspecified: Secondary | ICD-10-CM | POA: Diagnosis not present

## 2017-11-18 DIAGNOSIS — N2581 Secondary hyperparathyroidism of renal origin: Secondary | ICD-10-CM | POA: Diagnosis not present

## 2017-11-18 DIAGNOSIS — D509 Iron deficiency anemia, unspecified: Secondary | ICD-10-CM | POA: Diagnosis not present

## 2017-11-18 DIAGNOSIS — N186 End stage renal disease: Secondary | ICD-10-CM | POA: Diagnosis not present

## 2017-11-18 DIAGNOSIS — I120 Hypertensive chronic kidney disease with stage 5 chronic kidney disease or end stage renal disease: Secondary | ICD-10-CM | POA: Diagnosis not present

## 2017-11-19 ENCOUNTER — Other Ambulatory Visit: Payer: Self-pay

## 2017-11-19 NOTE — Patient Outreach (Signed)
[facility]) Care Management  11/19/2017  Darrell Johnson 1965/09/07 [id number]   1 st unsuccessful outreach to the patient for monthly assessment. No answer.  Unable to leave a voicemail the phone just rang several times.  Plan: RN Health Coach will send letter. RN Health Coach will make another attempt to the patient within four business days.  Darrell Arms RN, BSN, [place] Direct Dial:  [phone]  Fax: [phone]

## 2017-11-20 ENCOUNTER — Ambulatory Visit: Payer: Self-pay

## 2017-11-20 DIAGNOSIS — N186 End stage renal disease: Secondary | ICD-10-CM | POA: Diagnosis not present

## 2017-11-20 DIAGNOSIS — E8770 Fluid overload, unspecified: Secondary | ICD-10-CM | POA: Diagnosis not present

## 2017-11-20 DIAGNOSIS — D631 Anemia in chronic kidney disease: Secondary | ICD-10-CM | POA: Diagnosis not present

## 2017-11-20 DIAGNOSIS — D509 Iron deficiency anemia, unspecified: Secondary | ICD-10-CM | POA: Diagnosis not present

## 2017-11-20 DIAGNOSIS — N2581 Secondary hyperparathyroidism of renal origin: Secondary | ICD-10-CM | POA: Diagnosis not present

## 2017-11-23 ENCOUNTER — Ambulatory Visit (INDEPENDENT_AMBULATORY_CARE_PROVIDER_SITE_OTHER): Payer: Medicare Other | Admitting: Physician Assistant

## 2017-11-23 DIAGNOSIS — E8770 Fluid overload, unspecified: Secondary | ICD-10-CM | POA: Diagnosis not present

## 2017-11-23 DIAGNOSIS — D631 Anemia in chronic kidney disease: Secondary | ICD-10-CM | POA: Diagnosis not present

## 2017-11-23 DIAGNOSIS — D509 Iron deficiency anemia, unspecified: Secondary | ICD-10-CM | POA: Diagnosis not present

## 2017-11-23 DIAGNOSIS — N186 End stage renal disease: Secondary | ICD-10-CM | POA: Diagnosis not present

## 2017-11-23 DIAGNOSIS — N2581 Secondary hyperparathyroidism of renal origin: Secondary | ICD-10-CM | POA: Diagnosis not present

## 2017-11-25 DIAGNOSIS — D631 Anemia in chronic kidney disease: Secondary | ICD-10-CM | POA: Diagnosis not present

## 2017-11-25 DIAGNOSIS — D509 Iron deficiency anemia, unspecified: Secondary | ICD-10-CM | POA: Diagnosis not present

## 2017-11-25 DIAGNOSIS — E8770 Fluid overload, unspecified: Secondary | ICD-10-CM | POA: Diagnosis not present

## 2017-11-25 DIAGNOSIS — N2581 Secondary hyperparathyroidism of renal origin: Secondary | ICD-10-CM | POA: Diagnosis not present

## 2017-11-25 DIAGNOSIS — N186 End stage renal disease: Secondary | ICD-10-CM | POA: Diagnosis not present

## 2017-11-26 DIAGNOSIS — D631 Anemia in chronic kidney disease: Secondary | ICD-10-CM | POA: Diagnosis not present

## 2017-11-26 DIAGNOSIS — D509 Iron deficiency anemia, unspecified: Secondary | ICD-10-CM | POA: Diagnosis not present

## 2017-11-26 DIAGNOSIS — E8770 Fluid overload, unspecified: Secondary | ICD-10-CM | POA: Diagnosis not present

## 2017-11-26 DIAGNOSIS — N2581 Secondary hyperparathyroidism of renal origin: Secondary | ICD-10-CM | POA: Diagnosis not present

## 2017-11-26 DIAGNOSIS — N186 End stage renal disease: Secondary | ICD-10-CM | POA: Diagnosis not present

## 2017-11-27 MED FILL — ZIDOVUDINE 300 MG TABLET: 300 | 30 days supply | Qty: 30 | Fill #3

## 2017-11-27 MED FILL — JULUCA 50-25 MG TAB: 50-25 | 30 days supply | Qty: 30 | Fill #3

## 2017-11-30 DIAGNOSIS — N2581 Secondary hyperparathyroidism of renal origin: Secondary | ICD-10-CM | POA: Diagnosis not present

## 2017-11-30 DIAGNOSIS — E8770 Fluid overload, unspecified: Secondary | ICD-10-CM | POA: Diagnosis not present

## 2017-11-30 DIAGNOSIS — D509 Iron deficiency anemia, unspecified: Secondary | ICD-10-CM | POA: Diagnosis not present

## 2017-11-30 DIAGNOSIS — N186 End stage renal disease: Secondary | ICD-10-CM | POA: Diagnosis not present

## 2017-11-30 DIAGNOSIS — D631 Anemia in chronic kidney disease: Secondary | ICD-10-CM | POA: Diagnosis not present

## 2017-12-02 DIAGNOSIS — N2581 Secondary hyperparathyroidism of renal origin: Secondary | ICD-10-CM | POA: Diagnosis not present

## 2017-12-02 DIAGNOSIS — D509 Iron deficiency anemia, unspecified: Secondary | ICD-10-CM | POA: Diagnosis not present

## 2017-12-02 DIAGNOSIS — E8770 Fluid overload, unspecified: Secondary | ICD-10-CM | POA: Diagnosis not present

## 2017-12-02 DIAGNOSIS — N186 End stage renal disease: Secondary | ICD-10-CM | POA: Diagnosis not present

## 2017-12-02 DIAGNOSIS — D631 Anemia in chronic kidney disease: Secondary | ICD-10-CM | POA: Diagnosis not present

## 2017-12-04 DIAGNOSIS — E8770 Fluid overload, unspecified: Secondary | ICD-10-CM | POA: Diagnosis not present

## 2017-12-04 DIAGNOSIS — N2581 Secondary hyperparathyroidism of renal origin: Secondary | ICD-10-CM | POA: Diagnosis not present

## 2017-12-04 DIAGNOSIS — D509 Iron deficiency anemia, unspecified: Secondary | ICD-10-CM | POA: Diagnosis not present

## 2017-12-04 DIAGNOSIS — N186 End stage renal disease: Secondary | ICD-10-CM | POA: Diagnosis not present

## 2017-12-04 DIAGNOSIS — D631 Anemia in chronic kidney disease: Secondary | ICD-10-CM | POA: Diagnosis not present

## 2017-12-07 DIAGNOSIS — E8770 Fluid overload, unspecified: Secondary | ICD-10-CM | POA: Diagnosis not present

## 2017-12-07 DIAGNOSIS — D509 Iron deficiency anemia, unspecified: Secondary | ICD-10-CM | POA: Diagnosis not present

## 2017-12-07 DIAGNOSIS — N186 End stage renal disease: Secondary | ICD-10-CM | POA: Diagnosis not present

## 2017-12-07 DIAGNOSIS — D631 Anemia in chronic kidney disease: Secondary | ICD-10-CM | POA: Diagnosis not present

## 2017-12-07 DIAGNOSIS — N2581 Secondary hyperparathyroidism of renal origin: Secondary | ICD-10-CM | POA: Diagnosis not present

## 2017-12-08 DIAGNOSIS — H25813 Combined forms of age-related cataract, bilateral: Secondary | ICD-10-CM | POA: Diagnosis not present

## 2017-12-09 DIAGNOSIS — E8770 Fluid overload, unspecified: Secondary | ICD-10-CM | POA: Diagnosis not present

## 2017-12-09 DIAGNOSIS — D509 Iron deficiency anemia, unspecified: Secondary | ICD-10-CM | POA: Diagnosis not present

## 2017-12-09 DIAGNOSIS — N2581 Secondary hyperparathyroidism of renal origin: Secondary | ICD-10-CM | POA: Diagnosis not present

## 2017-12-09 DIAGNOSIS — N186 End stage renal disease: Secondary | ICD-10-CM | POA: Diagnosis not present

## 2017-12-09 DIAGNOSIS — D631 Anemia in chronic kidney disease: Secondary | ICD-10-CM | POA: Diagnosis not present

## 2017-12-11 DIAGNOSIS — D631 Anemia in chronic kidney disease: Secondary | ICD-10-CM | POA: Diagnosis not present

## 2017-12-11 DIAGNOSIS — N186 End stage renal disease: Secondary | ICD-10-CM | POA: Diagnosis not present

## 2017-12-11 DIAGNOSIS — D509 Iron deficiency anemia, unspecified: Secondary | ICD-10-CM | POA: Diagnosis not present

## 2017-12-11 DIAGNOSIS — E8770 Fluid overload, unspecified: Secondary | ICD-10-CM | POA: Diagnosis not present

## 2017-12-11 DIAGNOSIS — N2581 Secondary hyperparathyroidism of renal origin: Secondary | ICD-10-CM | POA: Diagnosis not present

## 2017-12-12 DIAGNOSIS — D509 Iron deficiency anemia, unspecified: Secondary | ICD-10-CM | POA: Diagnosis not present

## 2017-12-12 DIAGNOSIS — N186 End stage renal disease: Secondary | ICD-10-CM | POA: Diagnosis not present

## 2017-12-12 DIAGNOSIS — N2581 Secondary hyperparathyroidism of renal origin: Secondary | ICD-10-CM | POA: Diagnosis not present

## 2017-12-12 DIAGNOSIS — D631 Anemia in chronic kidney disease: Secondary | ICD-10-CM | POA: Diagnosis not present

## 2017-12-12 DIAGNOSIS — E8770 Fluid overload, unspecified: Secondary | ICD-10-CM | POA: Diagnosis not present

## 2017-12-14 DIAGNOSIS — N186 End stage renal disease: Secondary | ICD-10-CM | POA: Diagnosis not present

## 2017-12-14 DIAGNOSIS — N2581 Secondary hyperparathyroidism of renal origin: Secondary | ICD-10-CM | POA: Diagnosis not present

## 2017-12-14 DIAGNOSIS — E8770 Fluid overload, unspecified: Secondary | ICD-10-CM | POA: Diagnosis not present

## 2017-12-14 DIAGNOSIS — D509 Iron deficiency anemia, unspecified: Secondary | ICD-10-CM | POA: Diagnosis not present

## 2017-12-14 DIAGNOSIS — D631 Anemia in chronic kidney disease: Secondary | ICD-10-CM | POA: Diagnosis not present

## 2017-12-16 DIAGNOSIS — N2581 Secondary hyperparathyroidism of renal origin: Secondary | ICD-10-CM | POA: Diagnosis not present

## 2017-12-16 DIAGNOSIS — N186 End stage renal disease: Secondary | ICD-10-CM | POA: Diagnosis not present

## 2017-12-16 DIAGNOSIS — D509 Iron deficiency anemia, unspecified: Secondary | ICD-10-CM | POA: Diagnosis not present

## 2017-12-16 DIAGNOSIS — E8770 Fluid overload, unspecified: Secondary | ICD-10-CM | POA: Diagnosis not present

## 2017-12-16 DIAGNOSIS — D631 Anemia in chronic kidney disease: Secondary | ICD-10-CM | POA: Diagnosis not present

## 2017-12-19 DIAGNOSIS — I120 Hypertensive chronic kidney disease with stage 5 chronic kidney disease or end stage renal disease: Secondary | ICD-10-CM | POA: Diagnosis not present

## 2017-12-19 DIAGNOSIS — Z992 Dependence on renal dialysis: Secondary | ICD-10-CM | POA: Diagnosis not present

## 2017-12-19 DIAGNOSIS — N186 End stage renal disease: Secondary | ICD-10-CM | POA: Diagnosis not present

## 2017-12-19 DIAGNOSIS — D509 Iron deficiency anemia, unspecified: Secondary | ICD-10-CM | POA: Diagnosis not present

## 2017-12-19 DIAGNOSIS — E8779 Other fluid overload: Secondary | ICD-10-CM | POA: Diagnosis not present

## 2017-12-19 DIAGNOSIS — D631 Anemia in chronic kidney disease: Secondary | ICD-10-CM | POA: Diagnosis not present

## 2017-12-19 DIAGNOSIS — N2581 Secondary hyperparathyroidism of renal origin: Secondary | ICD-10-CM | POA: Diagnosis not present

## 2017-12-21 ENCOUNTER — Other Ambulatory Visit (INDEPENDENT_AMBULATORY_CARE_PROVIDER_SITE_OTHER): Payer: Self-pay | Admitting: Physician Assistant

## 2017-12-21 DIAGNOSIS — D631 Anemia in chronic kidney disease: Secondary | ICD-10-CM | POA: Diagnosis not present

## 2017-12-21 DIAGNOSIS — I10 Essential (primary) hypertension: Secondary | ICD-10-CM

## 2017-12-21 DIAGNOSIS — D509 Iron deficiency anemia, unspecified: Secondary | ICD-10-CM | POA: Diagnosis not present

## 2017-12-21 DIAGNOSIS — N2581 Secondary hyperparathyroidism of renal origin: Secondary | ICD-10-CM | POA: Diagnosis not present

## 2017-12-21 DIAGNOSIS — E8779 Other fluid overload: Secondary | ICD-10-CM | POA: Diagnosis not present

## 2017-12-21 DIAGNOSIS — N186 End stage renal disease: Secondary | ICD-10-CM | POA: Diagnosis not present

## 2017-12-21 NOTE — Telephone Encounter (Signed)
FWD to PCP. Tempestt S Roberts, CMA  

## 2017-12-23 DIAGNOSIS — N186 End stage renal disease: Secondary | ICD-10-CM | POA: Diagnosis not present

## 2017-12-23 DIAGNOSIS — E8779 Other fluid overload: Secondary | ICD-10-CM | POA: Diagnosis not present

## 2017-12-23 DIAGNOSIS — N2581 Secondary hyperparathyroidism of renal origin: Secondary | ICD-10-CM | POA: Diagnosis not present

## 2017-12-23 DIAGNOSIS — D631 Anemia in chronic kidney disease: Secondary | ICD-10-CM | POA: Diagnosis not present

## 2017-12-23 DIAGNOSIS — D509 Iron deficiency anemia, unspecified: Secondary | ICD-10-CM | POA: Diagnosis not present

## 2017-12-24 MED FILL — JULUCA 50-25 MG TAB: 50-25 | 30 days supply | Qty: 30 | Fill #4

## 2017-12-24 MED FILL — ZIDOVUDINE 300 MG TABLET: 300 | 30 days supply | Qty: 30 | Fill #4

## 2017-12-25 DIAGNOSIS — N2581 Secondary hyperparathyroidism of renal origin: Secondary | ICD-10-CM | POA: Diagnosis not present

## 2017-12-25 DIAGNOSIS — D631 Anemia in chronic kidney disease: Secondary | ICD-10-CM | POA: Diagnosis not present

## 2017-12-25 DIAGNOSIS — E8779 Other fluid overload: Secondary | ICD-10-CM | POA: Diagnosis not present

## 2017-12-25 DIAGNOSIS — N186 End stage renal disease: Secondary | ICD-10-CM | POA: Diagnosis not present

## 2017-12-25 DIAGNOSIS — D509 Iron deficiency anemia, unspecified: Secondary | ICD-10-CM | POA: Diagnosis not present

## 2017-12-28 DIAGNOSIS — D631 Anemia in chronic kidney disease: Secondary | ICD-10-CM | POA: Diagnosis not present

## 2017-12-28 DIAGNOSIS — N186 End stage renal disease: Secondary | ICD-10-CM | POA: Diagnosis not present

## 2017-12-28 DIAGNOSIS — D509 Iron deficiency anemia, unspecified: Secondary | ICD-10-CM | POA: Diagnosis not present

## 2017-12-28 DIAGNOSIS — E8779 Other fluid overload: Secondary | ICD-10-CM | POA: Diagnosis not present

## 2017-12-28 DIAGNOSIS — N2581 Secondary hyperparathyroidism of renal origin: Secondary | ICD-10-CM | POA: Diagnosis not present

## 2017-12-30 DIAGNOSIS — D509 Iron deficiency anemia, unspecified: Secondary | ICD-10-CM | POA: Diagnosis not present

## 2017-12-30 DIAGNOSIS — D631 Anemia in chronic kidney disease: Secondary | ICD-10-CM | POA: Diagnosis not present

## 2017-12-30 DIAGNOSIS — N186 End stage renal disease: Secondary | ICD-10-CM | POA: Diagnosis not present

## 2017-12-30 DIAGNOSIS — N2581 Secondary hyperparathyroidism of renal origin: Secondary | ICD-10-CM | POA: Diagnosis not present

## 2017-12-30 DIAGNOSIS — E8779 Other fluid overload: Secondary | ICD-10-CM | POA: Diagnosis not present

## 2018-01-01 DIAGNOSIS — D509 Iron deficiency anemia, unspecified: Secondary | ICD-10-CM | POA: Diagnosis not present

## 2018-01-01 DIAGNOSIS — D631 Anemia in chronic kidney disease: Secondary | ICD-10-CM | POA: Diagnosis not present

## 2018-01-01 DIAGNOSIS — N186 End stage renal disease: Secondary | ICD-10-CM | POA: Diagnosis not present

## 2018-01-01 DIAGNOSIS — E8779 Other fluid overload: Secondary | ICD-10-CM | POA: Diagnosis not present

## 2018-01-01 DIAGNOSIS — N2581 Secondary hyperparathyroidism of renal origin: Secondary | ICD-10-CM | POA: Diagnosis not present

## 2018-01-04 DIAGNOSIS — D509 Iron deficiency anemia, unspecified: Secondary | ICD-10-CM | POA: Diagnosis not present

## 2018-01-04 DIAGNOSIS — E8779 Other fluid overload: Secondary | ICD-10-CM | POA: Diagnosis not present

## 2018-01-04 DIAGNOSIS — D631 Anemia in chronic kidney disease: Secondary | ICD-10-CM | POA: Diagnosis not present

## 2018-01-04 DIAGNOSIS — N2581 Secondary hyperparathyroidism of renal origin: Secondary | ICD-10-CM | POA: Diagnosis not present

## 2018-01-04 DIAGNOSIS — N186 End stage renal disease: Secondary | ICD-10-CM | POA: Diagnosis not present

## 2018-01-05 DIAGNOSIS — H25812 Combined forms of age-related cataract, left eye: Secondary | ICD-10-CM | POA: Diagnosis not present

## 2018-01-05 DIAGNOSIS — H25811 Combined forms of age-related cataract, right eye: Secondary | ICD-10-CM | POA: Diagnosis not present

## 2018-01-05 DIAGNOSIS — Z01818 Encounter for other preprocedural examination: Secondary | ICD-10-CM | POA: Diagnosis not present

## 2018-01-06 DIAGNOSIS — E8779 Other fluid overload: Secondary | ICD-10-CM | POA: Diagnosis not present

## 2018-01-06 DIAGNOSIS — D631 Anemia in chronic kidney disease: Secondary | ICD-10-CM | POA: Diagnosis not present

## 2018-01-06 DIAGNOSIS — D509 Iron deficiency anemia, unspecified: Secondary | ICD-10-CM | POA: Diagnosis not present

## 2018-01-06 DIAGNOSIS — N186 End stage renal disease: Secondary | ICD-10-CM | POA: Diagnosis not present

## 2018-01-06 DIAGNOSIS — N2581 Secondary hyperparathyroidism of renal origin: Secondary | ICD-10-CM | POA: Diagnosis not present

## 2018-01-08 DIAGNOSIS — D509 Iron deficiency anemia, unspecified: Secondary | ICD-10-CM | POA: Diagnosis not present

## 2018-01-08 DIAGNOSIS — E8779 Other fluid overload: Secondary | ICD-10-CM | POA: Diagnosis not present

## 2018-01-08 DIAGNOSIS — N2581 Secondary hyperparathyroidism of renal origin: Secondary | ICD-10-CM | POA: Diagnosis not present

## 2018-01-08 DIAGNOSIS — N186 End stage renal disease: Secondary | ICD-10-CM | POA: Diagnosis not present

## 2018-01-08 DIAGNOSIS — D631 Anemia in chronic kidney disease: Secondary | ICD-10-CM | POA: Diagnosis not present

## 2018-01-11 DIAGNOSIS — N186 End stage renal disease: Secondary | ICD-10-CM | POA: Diagnosis not present

## 2018-01-11 DIAGNOSIS — D509 Iron deficiency anemia, unspecified: Secondary | ICD-10-CM | POA: Diagnosis not present

## 2018-01-11 DIAGNOSIS — D631 Anemia in chronic kidney disease: Secondary | ICD-10-CM | POA: Diagnosis not present

## 2018-01-11 DIAGNOSIS — N2581 Secondary hyperparathyroidism of renal origin: Secondary | ICD-10-CM | POA: Diagnosis not present

## 2018-01-11 DIAGNOSIS — E8779 Other fluid overload: Secondary | ICD-10-CM | POA: Diagnosis not present

## 2018-01-12 DIAGNOSIS — E8779 Other fluid overload: Secondary | ICD-10-CM | POA: Diagnosis not present

## 2018-01-12 DIAGNOSIS — N186 End stage renal disease: Secondary | ICD-10-CM | POA: Diagnosis not present

## 2018-01-12 DIAGNOSIS — D631 Anemia in chronic kidney disease: Secondary | ICD-10-CM | POA: Diagnosis not present

## 2018-01-12 DIAGNOSIS — N2581 Secondary hyperparathyroidism of renal origin: Secondary | ICD-10-CM | POA: Diagnosis not present

## 2018-01-12 DIAGNOSIS — D509 Iron deficiency anemia, unspecified: Secondary | ICD-10-CM | POA: Diagnosis not present

## 2018-01-13 DIAGNOSIS — N2581 Secondary hyperparathyroidism of renal origin: Secondary | ICD-10-CM | POA: Diagnosis not present

## 2018-01-13 DIAGNOSIS — E8779 Other fluid overload: Secondary | ICD-10-CM | POA: Diagnosis not present

## 2018-01-13 DIAGNOSIS — D631 Anemia in chronic kidney disease: Secondary | ICD-10-CM | POA: Diagnosis not present

## 2018-01-13 DIAGNOSIS — N186 End stage renal disease: Secondary | ICD-10-CM | POA: Diagnosis not present

## 2018-01-13 DIAGNOSIS — D509 Iron deficiency anemia, unspecified: Secondary | ICD-10-CM | POA: Diagnosis not present

## 2018-01-15 ENCOUNTER — Other Ambulatory Visit: Payer: Self-pay

## 2018-01-15 DIAGNOSIS — N2581 Secondary hyperparathyroidism of renal origin: Secondary | ICD-10-CM | POA: Diagnosis not present

## 2018-01-15 DIAGNOSIS — D631 Anemia in chronic kidney disease: Secondary | ICD-10-CM | POA: Diagnosis not present

## 2018-01-15 DIAGNOSIS — D509 Iron deficiency anemia, unspecified: Secondary | ICD-10-CM | POA: Diagnosis not present

## 2018-01-15 DIAGNOSIS — E8779 Other fluid overload: Secondary | ICD-10-CM | POA: Diagnosis not present

## 2018-01-15 DIAGNOSIS — N186 End stage renal disease: Secondary | ICD-10-CM | POA: Diagnosis not present

## 2018-01-18 DIAGNOSIS — N186 End stage renal disease: Secondary | ICD-10-CM | POA: Diagnosis not present

## 2018-01-18 DIAGNOSIS — D631 Anemia in chronic kidney disease: Secondary | ICD-10-CM | POA: Diagnosis not present

## 2018-01-18 DIAGNOSIS — Z992 Dependence on renal dialysis: Secondary | ICD-10-CM | POA: Diagnosis not present

## 2018-01-18 DIAGNOSIS — D509 Iron deficiency anemia, unspecified: Secondary | ICD-10-CM | POA: Diagnosis not present

## 2018-01-18 DIAGNOSIS — N2581 Secondary hyperparathyroidism of renal origin: Secondary | ICD-10-CM | POA: Diagnosis not present

## 2018-01-18 DIAGNOSIS — I120 Hypertensive chronic kidney disease with stage 5 chronic kidney disease or end stage renal disease: Secondary | ICD-10-CM | POA: Diagnosis not present

## 2018-01-18 MED FILL — ZIDOVUDINE 300 MG TABLET: 300 | 30 days supply | Qty: 30 | Fill #5

## 2018-01-18 MED FILL — JULUCA 50-25 MG TAB: 50-25 | 30 days supply | Qty: 30 | Fill #5

## 2018-01-20 DIAGNOSIS — N2581 Secondary hyperparathyroidism of renal origin: Secondary | ICD-10-CM | POA: Diagnosis not present

## 2018-01-20 DIAGNOSIS — D631 Anemia in chronic kidney disease: Secondary | ICD-10-CM | POA: Diagnosis not present

## 2018-01-20 DIAGNOSIS — D509 Iron deficiency anemia, unspecified: Secondary | ICD-10-CM | POA: Diagnosis not present

## 2018-01-20 DIAGNOSIS — N186 End stage renal disease: Secondary | ICD-10-CM | POA: Diagnosis not present

## 2018-01-22 DIAGNOSIS — N186 End stage renal disease: Secondary | ICD-10-CM | POA: Diagnosis not present

## 2018-01-22 DIAGNOSIS — N2581 Secondary hyperparathyroidism of renal origin: Secondary | ICD-10-CM | POA: Diagnosis not present

## 2018-01-22 DIAGNOSIS — D509 Iron deficiency anemia, unspecified: Secondary | ICD-10-CM | POA: Diagnosis not present

## 2018-01-22 DIAGNOSIS — D631 Anemia in chronic kidney disease: Secondary | ICD-10-CM | POA: Diagnosis not present

## 2018-01-25 DIAGNOSIS — D631 Anemia in chronic kidney disease: Secondary | ICD-10-CM | POA: Diagnosis not present

## 2018-01-25 DIAGNOSIS — N2581 Secondary hyperparathyroidism of renal origin: Secondary | ICD-10-CM | POA: Diagnosis not present

## 2018-01-25 DIAGNOSIS — D509 Iron deficiency anemia, unspecified: Secondary | ICD-10-CM | POA: Diagnosis not present

## 2018-01-25 DIAGNOSIS — N186 End stage renal disease: Secondary | ICD-10-CM | POA: Diagnosis not present

## 2018-01-27 DIAGNOSIS — D631 Anemia in chronic kidney disease: Secondary | ICD-10-CM | POA: Diagnosis not present

## 2018-01-27 DIAGNOSIS — N186 End stage renal disease: Secondary | ICD-10-CM | POA: Diagnosis not present

## 2018-01-27 DIAGNOSIS — D509 Iron deficiency anemia, unspecified: Secondary | ICD-10-CM | POA: Diagnosis not present

## 2018-01-27 DIAGNOSIS — N2581 Secondary hyperparathyroidism of renal origin: Secondary | ICD-10-CM | POA: Diagnosis not present

## 2018-01-28 DIAGNOSIS — H2512 Age-related nuclear cataract, left eye: Secondary | ICD-10-CM | POA: Diagnosis not present

## 2018-01-28 DIAGNOSIS — H25812 Combined forms of age-related cataract, left eye: Secondary | ICD-10-CM | POA: Diagnosis not present

## 2018-01-29 DIAGNOSIS — D509 Iron deficiency anemia, unspecified: Secondary | ICD-10-CM | POA: Diagnosis not present

## 2018-01-29 DIAGNOSIS — N2581 Secondary hyperparathyroidism of renal origin: Secondary | ICD-10-CM | POA: Diagnosis not present

## 2018-01-29 DIAGNOSIS — N186 End stage renal disease: Secondary | ICD-10-CM | POA: Diagnosis not present

## 2018-01-29 DIAGNOSIS — D631 Anemia in chronic kidney disease: Secondary | ICD-10-CM | POA: Diagnosis not present

## 2018-02-01 DIAGNOSIS — N186 End stage renal disease: Secondary | ICD-10-CM | POA: Diagnosis not present

## 2018-02-01 DIAGNOSIS — D631 Anemia in chronic kidney disease: Secondary | ICD-10-CM | POA: Diagnosis not present

## 2018-02-01 DIAGNOSIS — N2581 Secondary hyperparathyroidism of renal origin: Secondary | ICD-10-CM | POA: Diagnosis not present

## 2018-02-01 DIAGNOSIS — D509 Iron deficiency anemia, unspecified: Secondary | ICD-10-CM | POA: Diagnosis not present

## 2018-02-03 DIAGNOSIS — N2581 Secondary hyperparathyroidism of renal origin: Secondary | ICD-10-CM | POA: Diagnosis not present

## 2018-02-03 DIAGNOSIS — D509 Iron deficiency anemia, unspecified: Secondary | ICD-10-CM | POA: Diagnosis not present

## 2018-02-03 DIAGNOSIS — D631 Anemia in chronic kidney disease: Secondary | ICD-10-CM | POA: Diagnosis not present

## 2018-02-03 DIAGNOSIS — N186 End stage renal disease: Secondary | ICD-10-CM | POA: Diagnosis not present

## 2018-02-05 DIAGNOSIS — D631 Anemia in chronic kidney disease: Secondary | ICD-10-CM | POA: Diagnosis not present

## 2018-02-05 DIAGNOSIS — D509 Iron deficiency anemia, unspecified: Secondary | ICD-10-CM | POA: Diagnosis not present

## 2018-02-05 DIAGNOSIS — N186 End stage renal disease: Secondary | ICD-10-CM | POA: Diagnosis not present

## 2018-02-05 DIAGNOSIS — N2581 Secondary hyperparathyroidism of renal origin: Secondary | ICD-10-CM | POA: Diagnosis not present

## 2018-02-08 DIAGNOSIS — D509 Iron deficiency anemia, unspecified: Secondary | ICD-10-CM | POA: Diagnosis not present

## 2018-02-08 DIAGNOSIS — N186 End stage renal disease: Secondary | ICD-10-CM | POA: Diagnosis not present

## 2018-02-08 DIAGNOSIS — D631 Anemia in chronic kidney disease: Secondary | ICD-10-CM | POA: Diagnosis not present

## 2018-02-08 DIAGNOSIS — N2581 Secondary hyperparathyroidism of renal origin: Secondary | ICD-10-CM | POA: Diagnosis not present

## 2018-02-10 ENCOUNTER — Other Ambulatory Visit: Payer: Self-pay

## 2018-02-10 ENCOUNTER — Ambulatory Visit: Payer: Medicare Other | Admitting: Internal Medicine

## 2018-02-10 DIAGNOSIS — H25811 Combined forms of age-related cataract, right eye: Secondary | ICD-10-CM | POA: Diagnosis not present

## 2018-02-10 DIAGNOSIS — H2511 Age-related nuclear cataract, right eye: Secondary | ICD-10-CM | POA: Diagnosis not present

## 2018-02-10 NOTE — Patient Outreach (Signed)
[facility]) Care Management  02/10/2018  Darrell Johnson 1966-05-16 [id number]   1st unsuccessful attempt to outreach the patient for assessment, No answer. Unable to leave a message no answering machine to pick up.  Plan: RN Health Coach will send letter. RN Health Coach will make another attempt to the patient within four business days.  Lazaro Arms RN, BSN, [place] Direct Dial:  [phone]  Fax: [phone]

## 2018-02-11 DIAGNOSIS — N186 End stage renal disease: Secondary | ICD-10-CM | POA: Diagnosis not present

## 2018-02-11 DIAGNOSIS — D509 Iron deficiency anemia, unspecified: Secondary | ICD-10-CM | POA: Diagnosis not present

## 2018-02-11 DIAGNOSIS — N2581 Secondary hyperparathyroidism of renal origin: Secondary | ICD-10-CM | POA: Diagnosis not present

## 2018-02-11 DIAGNOSIS — D631 Anemia in chronic kidney disease: Secondary | ICD-10-CM | POA: Diagnosis not present

## 2018-02-11 MED FILL — JULUCA 50-25 MG TAB: 50-25 | 30 days supply | Qty: 30 | Fill #6

## 2018-02-11 MED FILL — ZIDOVUDINE 300 MG TABLET: 300 | 30 days supply | Qty: 30 | Fill #6

## 2018-02-12 DIAGNOSIS — D509 Iron deficiency anemia, unspecified: Secondary | ICD-10-CM | POA: Diagnosis not present

## 2018-02-12 DIAGNOSIS — D631 Anemia in chronic kidney disease: Secondary | ICD-10-CM | POA: Diagnosis not present

## 2018-02-12 DIAGNOSIS — N2581 Secondary hyperparathyroidism of renal origin: Secondary | ICD-10-CM | POA: Diagnosis not present

## 2018-02-12 DIAGNOSIS — N186 End stage renal disease: Secondary | ICD-10-CM | POA: Diagnosis not present

## 2018-02-15 DIAGNOSIS — D631 Anemia in chronic kidney disease: Secondary | ICD-10-CM | POA: Diagnosis not present

## 2018-02-15 DIAGNOSIS — N2581 Secondary hyperparathyroidism of renal origin: Secondary | ICD-10-CM | POA: Diagnosis not present

## 2018-02-15 DIAGNOSIS — D509 Iron deficiency anemia, unspecified: Secondary | ICD-10-CM | POA: Diagnosis not present

## 2018-02-15 DIAGNOSIS — N186 End stage renal disease: Secondary | ICD-10-CM | POA: Diagnosis not present

## 2018-02-16 ENCOUNTER — Other Ambulatory Visit: Payer: Self-pay

## 2018-02-16 NOTE — Patient Outreach (Signed)
[facility]) Care Management  02/16/2018   Darrell Johnson 11-Oct-1965 [id number]  Subjective: Telephone call to the patient for assessment. HIPAA verified. The patient states that he is doing ok.  He recently had cataracts removed and recovering at home.    He denies any pain or falls. He did not check his blood pressure today and does not check it on a regular basis.  I talked with the patient about checking his blood pressure daily and recording his values. He verbalized understanding.  He states that he goes to dialysis three days a week and they check it there.  I discussed with the patient about a low salt diet, medication adherence and signs and symptoms of hypertension. The patient verbalized understanding. The patient was unable to tell me when his next appointment was.  He will call his physician for an appointment.    Current Medications:  Current Outpatient Medications  Medication Sig Dispense Refill  . albuterol (PROVENTIL HFA;VENTOLIN HFA) 108 (90 Base) MCG/ACT inhaler Inhale 2 puffs into the lungs every 4 (four) hours as needed for wheezing or shortness of breath. 1 Inhaler 5  . amLODipine (NORVASC) 10 MG tablet Take 1 tablet (10 mg total) by mouth daily. 90 tablet 3  . carvedilol (COREG) 25 MG tablet Take 1 tablet (25 mg total) by mouth 2 (two) times daily with a meal. 180 tablet 3  . cloNIDine (CATAPRES) 0.1 MG tablet TAKE ONE TABLET IF BLOOD PRESSURE IS 180/110 MMHG OR GREATER. MAY TAKE ONE PILL UP TO TWICE PER DAY. 60 tablet 3  . cyclobenzaprine (FLEXERIL) 10 MG tablet Take 1 tablet (10 mg total) by mouth 3 (three) times daily as needed for muscle spasms. 30 tablet 0  . Dolutegravir-Rilpivirine (JULUCA) 50-25 MG TABS Take 1 tablet by mouth daily with supper. 30 tablet 5  . doxazosin (CARDURA) 8 MG tablet Take 1 tablet (8 mg total) by mouth at bedtime. 90 tablet 3  . ferric citrate (AURYXIA) 1 GM 210 MG(Fe) tablet Take 2 tablets (420 mg total) by mouth 2 (two)  times daily with a meal. 120 tablet 0  . ipratropium-albuterol (DUONEB) 0.5-2.5 (3) MG/3ML SOLN Take 3 mLs by nebulization every 6 (six) hours as needed. 360 mL 1  . montelukast (SINGULAIR) 10 MG tablet Take 1 tablet (10 mg total) by mouth at bedtime. 30 tablet 2  . zidovudine (RETROVIR) 300 MG tablet Take 1 tablet (300 mg total) by mouth daily. 30 tablet 5  . Calcium Carbonate Antacid (CALCIUM CARBONATE, DOSED IN MG ELEMENTAL CALCIUM,) 1250 MG/5ML SUSP Take 5 mLs (500 mg of elemental calcium total) by mouth every 6 (six) hours as needed for indigestion. (Patient not taking: Reported on 11/03/2017) 450 mL 0  . camphor-menthol (SARNA) lotion Apply 1 application topically every 8 (eight) hours as needed for itching. (Patient not taking: Reported on 02/16/2018) 222 mL 0  . pravastatin (PRAVACHOL) 40 MG tablet TAKE 1 TABLET (40 MG TOTAL) BY MOUTH EVERY EVENING. 90 tablet 3   No current facility-administered medications for this visit.     Functional Status:  In your present state of health, do you have any difficulty performing the following activities: 10/13/2017 08/11/2017  Hearing? N N  Vision? N N  Difficulty concentrating or making decisions? N N  Walking or climbing stairs? N N  Dressing or bathing? N N  Doing errands, shopping? N N  Preparing Food and eating ? - N  Using the Toilet? - N  In the past  six months, have you accidently leaked urine? - N  Do you have problems with loss of bowel control? - N  Managing your Medications? - N  Managing your Finances? - N  Housekeeping or managing your Housekeeping? - N  Some recent data might be hidden    Fall/Depression Screening: Fall Risk  10/19/2017 08/11/2017 08/07/2017  Falls in the past year? No No No  Risk for fall due to : - - -   PHQ 2/9 Scores 10/22/2017 10/19/2017 09/10/2017 08/11/2017 08/07/2017 04/06/2017 04/02/2017  PHQ - 2 Score 0 0 0 0 0 0 0  PHQ- 9 Score 0 - 4 - - - -    Assessment: Patient will continue to benefit from health coach  outreach for disease management and support.  THN CM Care Plan Problem One     Most Recent Value  THN Long Term Goal   In 90 days the patient will verbalize that his blood pressure have remianed stable in the 140/80's  THN Long Term Goal Start Date  02/16/18  Interventions for Problem One Long Term Goal  Discussd with the patient about diet, signs and symptoms of HTN and medication adherence  THN CM Short Term Goal #1   in 30 days the patient will verbalize that he is checking and recording his blood pressures daily  THN CM Short Term Goal #1 Start Date  02/16/18  THN CM Short Term Goal #2   In 30 days the patient will verbalize that he is monitoring his diet  THN CM Short Term Goal #2 Start Date  02/16/18  Interventions for Short Term Goal #2  discussed low salt diet       Plan: [place] will contact patient in the month of August and patient agrees to next outreach.  Lazaro Arms RN, BSN, [place] Direct Dial:  [phone]  Fax: [phone]

## 2018-02-17 DIAGNOSIS — N2581 Secondary hyperparathyroidism of renal origin: Secondary | ICD-10-CM | POA: Diagnosis not present

## 2018-02-17 DIAGNOSIS — D631 Anemia in chronic kidney disease: Secondary | ICD-10-CM | POA: Diagnosis not present

## 2018-02-17 DIAGNOSIS — N186 End stage renal disease: Secondary | ICD-10-CM | POA: Diagnosis not present

## 2018-02-17 DIAGNOSIS — D509 Iron deficiency anemia, unspecified: Secondary | ICD-10-CM | POA: Diagnosis not present

## 2018-02-18 DIAGNOSIS — I120 Hypertensive chronic kidney disease with stage 5 chronic kidney disease or end stage renal disease: Secondary | ICD-10-CM | POA: Diagnosis not present

## 2018-02-18 DIAGNOSIS — Z992 Dependence on renal dialysis: Secondary | ICD-10-CM | POA: Diagnosis not present

## 2018-02-18 DIAGNOSIS — N186 End stage renal disease: Secondary | ICD-10-CM | POA: Diagnosis not present

## 2018-02-19 DIAGNOSIS — N186 End stage renal disease: Secondary | ICD-10-CM | POA: Diagnosis not present

## 2018-02-19 DIAGNOSIS — D509 Iron deficiency anemia, unspecified: Secondary | ICD-10-CM | POA: Diagnosis not present

## 2018-02-19 DIAGNOSIS — N2581 Secondary hyperparathyroidism of renal origin: Secondary | ICD-10-CM | POA: Diagnosis not present

## 2018-02-19 DIAGNOSIS — D631 Anemia in chronic kidney disease: Secondary | ICD-10-CM | POA: Diagnosis not present

## 2018-02-22 DIAGNOSIS — D509 Iron deficiency anemia, unspecified: Secondary | ICD-10-CM | POA: Diagnosis not present

## 2018-02-22 DIAGNOSIS — N2581 Secondary hyperparathyroidism of renal origin: Secondary | ICD-10-CM | POA: Diagnosis not present

## 2018-02-22 DIAGNOSIS — D631 Anemia in chronic kidney disease: Secondary | ICD-10-CM | POA: Diagnosis not present

## 2018-02-22 DIAGNOSIS — N186 End stage renal disease: Secondary | ICD-10-CM | POA: Diagnosis not present

## 2018-02-24 DIAGNOSIS — D509 Iron deficiency anemia, unspecified: Secondary | ICD-10-CM | POA: Diagnosis not present

## 2018-02-24 DIAGNOSIS — N186 End stage renal disease: Secondary | ICD-10-CM | POA: Diagnosis not present

## 2018-02-24 DIAGNOSIS — N2581 Secondary hyperparathyroidism of renal origin: Secondary | ICD-10-CM | POA: Diagnosis not present

## 2018-02-24 DIAGNOSIS — D631 Anemia in chronic kidney disease: Secondary | ICD-10-CM | POA: Diagnosis not present

## 2018-02-26 DIAGNOSIS — D509 Iron deficiency anemia, unspecified: Secondary | ICD-10-CM | POA: Diagnosis not present

## 2018-02-26 DIAGNOSIS — N186 End stage renal disease: Secondary | ICD-10-CM | POA: Diagnosis not present

## 2018-02-26 DIAGNOSIS — N2581 Secondary hyperparathyroidism of renal origin: Secondary | ICD-10-CM | POA: Diagnosis not present

## 2018-02-26 DIAGNOSIS — D631 Anemia in chronic kidney disease: Secondary | ICD-10-CM | POA: Diagnosis not present

## 2018-03-01 DIAGNOSIS — D509 Iron deficiency anemia, unspecified: Secondary | ICD-10-CM | POA: Diagnosis not present

## 2018-03-01 DIAGNOSIS — D631 Anemia in chronic kidney disease: Secondary | ICD-10-CM | POA: Diagnosis not present

## 2018-03-01 DIAGNOSIS — N186 End stage renal disease: Secondary | ICD-10-CM | POA: Diagnosis not present

## 2018-03-01 DIAGNOSIS — N2581 Secondary hyperparathyroidism of renal origin: Secondary | ICD-10-CM | POA: Diagnosis not present

## 2018-03-03 DIAGNOSIS — N186 End stage renal disease: Secondary | ICD-10-CM | POA: Diagnosis not present

## 2018-03-03 DIAGNOSIS — D509 Iron deficiency anemia, unspecified: Secondary | ICD-10-CM | POA: Diagnosis not present

## 2018-03-03 DIAGNOSIS — D631 Anemia in chronic kidney disease: Secondary | ICD-10-CM | POA: Diagnosis not present

## 2018-03-03 DIAGNOSIS — N2581 Secondary hyperparathyroidism of renal origin: Secondary | ICD-10-CM | POA: Diagnosis not present

## 2018-03-05 DIAGNOSIS — D509 Iron deficiency anemia, unspecified: Secondary | ICD-10-CM | POA: Diagnosis not present

## 2018-03-05 DIAGNOSIS — D631 Anemia in chronic kidney disease: Secondary | ICD-10-CM | POA: Diagnosis not present

## 2018-03-05 DIAGNOSIS — N186 End stage renal disease: Secondary | ICD-10-CM | POA: Diagnosis not present

## 2018-03-05 DIAGNOSIS — N2581 Secondary hyperparathyroidism of renal origin: Secondary | ICD-10-CM | POA: Diagnosis not present

## 2018-03-08 DIAGNOSIS — N186 End stage renal disease: Secondary | ICD-10-CM | POA: Diagnosis not present

## 2018-03-08 DIAGNOSIS — D631 Anemia in chronic kidney disease: Secondary | ICD-10-CM | POA: Diagnosis not present

## 2018-03-08 DIAGNOSIS — D509 Iron deficiency anemia, unspecified: Secondary | ICD-10-CM | POA: Diagnosis not present

## 2018-03-08 DIAGNOSIS — N2581 Secondary hyperparathyroidism of renal origin: Secondary | ICD-10-CM | POA: Diagnosis not present

## 2018-03-08 MED FILL — ZIDOVUDINE 300 MG TABLET: 300 | 30 days supply | Qty: 30 | Fill #7

## 2018-03-08 MED FILL — JULUCA 50-25 MG TAB: 50-25 | 30 days supply | Qty: 30 | Fill #7

## 2018-03-09 DIAGNOSIS — N186 End stage renal disease: Secondary | ICD-10-CM | POA: Diagnosis not present

## 2018-03-09 DIAGNOSIS — D631 Anemia in chronic kidney disease: Secondary | ICD-10-CM | POA: Diagnosis not present

## 2018-03-09 DIAGNOSIS — D509 Iron deficiency anemia, unspecified: Secondary | ICD-10-CM | POA: Diagnosis not present

## 2018-03-09 DIAGNOSIS — N2581 Secondary hyperparathyroidism of renal origin: Secondary | ICD-10-CM | POA: Diagnosis not present

## 2018-03-12 DIAGNOSIS — N2581 Secondary hyperparathyroidism of renal origin: Secondary | ICD-10-CM | POA: Diagnosis not present

## 2018-03-12 DIAGNOSIS — D509 Iron deficiency anemia, unspecified: Secondary | ICD-10-CM | POA: Diagnosis not present

## 2018-03-12 DIAGNOSIS — D631 Anemia in chronic kidney disease: Secondary | ICD-10-CM | POA: Diagnosis not present

## 2018-03-12 DIAGNOSIS — N186 End stage renal disease: Secondary | ICD-10-CM | POA: Diagnosis not present

## 2018-03-15 DIAGNOSIS — N186 End stage renal disease: Secondary | ICD-10-CM | POA: Diagnosis not present

## 2018-03-15 DIAGNOSIS — N2581 Secondary hyperparathyroidism of renal origin: Secondary | ICD-10-CM | POA: Diagnosis not present

## 2018-03-15 DIAGNOSIS — D509 Iron deficiency anemia, unspecified: Secondary | ICD-10-CM | POA: Diagnosis not present

## 2018-03-15 DIAGNOSIS — D631 Anemia in chronic kidney disease: Secondary | ICD-10-CM | POA: Diagnosis not present

## 2018-03-17 DIAGNOSIS — N2581 Secondary hyperparathyroidism of renal origin: Secondary | ICD-10-CM | POA: Diagnosis not present

## 2018-03-17 DIAGNOSIS — D509 Iron deficiency anemia, unspecified: Secondary | ICD-10-CM | POA: Diagnosis not present

## 2018-03-17 DIAGNOSIS — N186 End stage renal disease: Secondary | ICD-10-CM | POA: Diagnosis not present

## 2018-03-17 DIAGNOSIS — D631 Anemia in chronic kidney disease: Secondary | ICD-10-CM | POA: Diagnosis not present

## 2018-03-19 ENCOUNTER — Other Ambulatory Visit: Payer: Self-pay

## 2018-03-19 DIAGNOSIS — D509 Iron deficiency anemia, unspecified: Secondary | ICD-10-CM | POA: Diagnosis not present

## 2018-03-19 DIAGNOSIS — N186 End stage renal disease: Secondary | ICD-10-CM | POA: Diagnosis not present

## 2018-03-19 DIAGNOSIS — D631 Anemia in chronic kidney disease: Secondary | ICD-10-CM | POA: Diagnosis not present

## 2018-03-19 DIAGNOSIS — N2581 Secondary hyperparathyroidism of renal origin: Secondary | ICD-10-CM | POA: Diagnosis not present

## 2018-03-19 NOTE — Patient Outreach (Signed)
[facility]) Care Management  03/19/2018   Darrell Johnson February 23, 1966 [id number]  Subjective: Telephone call to the patient for assessment.  The patient was currently at dialysis.  He states that he receives dialysis three times a week.  He states that he has his blood pressure  Is checked when he goes and does not check it at home nor does he write down the values. I discussed with the patient about the value of recording and checking his blood pressure..  The patient verbalized understanding.  He states that his blood pressure today was 130/76.  He states that he has cut salt out of his diet.  He is taking his medications as prescribed.    He denies any pain or falls.  He states that he has been seeing Dr Eliberto Ivory at [facility] regarding his transplant.    Current Medications:  Current Outpatient Medications  Medication Sig Dispense Refill  . albuterol (PROVENTIL HFA;VENTOLIN HFA) 108 (90 Base) MCG/ACT inhaler Inhale 2 puffs into the lungs every 4 (four) hours as needed for wheezing or shortness of breath. 1 Inhaler 5  . amLODipine (NORVASC) 10 MG tablet Take 1 tablet (10 mg total) by mouth daily. 90 tablet 3  . carvedilol (COREG) 25 MG tablet Take 1 tablet (25 mg total) by mouth 2 (two) times daily with a meal. 180 tablet 3  . cloNIDine (CATAPRES) 0.1 MG tablet TAKE ONE TABLET IF BLOOD PRESSURE IS 180/110 MMHG OR GREATER. MAY TAKE ONE PILL UP TO TWICE PER DAY. 60 tablet 3  . cyclobenzaprine (FLEXERIL) 10 MG tablet Take 1 tablet (10 mg total) by mouth 3 (three) times daily as needed for muscle spasms. 30 tablet 0  . Dolutegravir-Rilpivirine (JULUCA) 50-25 MG TABS Take 1 tablet by mouth daily with supper. 30 tablet 5  . doxazosin (CARDURA) 8 MG tablet Take 1 tablet (8 mg total) by mouth at bedtime. 90 tablet 3  . ferric citrate (AURYXIA) 1 GM 210 MG(Fe) tablet Take 2 tablets (420 mg total) by mouth 2 (two) times daily with a meal. 120 tablet 0  . ipratropium-albuterol (DUONEB) 0.5-2.5  (3) MG/3ML SOLN Take 3 mLs by nebulization every 6 (six) hours as needed. 360 mL 1  . montelukast (SINGULAIR) 10 MG tablet Take 1 tablet (10 mg total) by mouth at bedtime. 30 tablet 2  . zidovudine (RETROVIR) 300 MG tablet Take 1 tablet (300 mg total) by mouth daily. 30 tablet 5  . Calcium Carbonate Antacid (CALCIUM CARBONATE, DOSED IN MG ELEMENTAL CALCIUM,) 1250 MG/5ML SUSP Take 5 mLs (500 mg of elemental calcium total) by mouth every 6 (six) hours as needed for indigestion. (Patient not taking: Reported on 03/19/2018) 450 mL 0  . camphor-menthol (SARNA) lotion Apply 1 application topically every 8 (eight) hours as needed for itching. (Patient not taking: Reported on 02/16/2018) 222 mL 0  . pravastatin (PRAVACHOL) 40 MG tablet TAKE 1 TABLET (40 MG TOTAL) BY MOUTH EVERY EVENING. 90 tablet 3   No current facility-administered medications for this visit.     Functional Status:  In your present state of health, do you have any difficulty performing the following activities: 10/13/2017 08/11/2017  Hearing? N N  Vision? N N  Difficulty concentrating or making decisions? N N  Walking or climbing stairs? N N  Dressing or bathing? N N  Doing errands, shopping? N N  Conservation officer, nature and eating ? - N  Using the Toilet? - N  In the past six months, have you accidently  leaked urine? - N  Do you have problems with loss of bowel control? - N  Managing your Medications? - N  Managing your Finances? - N  Housekeeping or managing your Housekeeping? - N  Some recent data might be hidden    Fall/Depression Screening: Fall Risk  03/19/2018 10/19/2017 08/11/2017  Falls in the past year? No No No  Risk for fall due to : - - -   PHQ 2/9 Scores 10/22/2017 10/19/2017 09/10/2017 08/11/2017 08/07/2017 04/06/2017 04/02/2017  PHQ - 2 Score 0 0 0 0 0 0 0  PHQ- 9 Score 0 - 4 - - - -    Assessment: Patient will continue to benefit from health coach outreach for disease management and support.  THN CM Care Plan Problem One      Most Recent Value  THN Long Term Goal   In 90 days the patient will verbalize that his blood pressure have remianed stable in the 140/80's  THN Long Term Goal Start Date  03/19/18  Interventions for Problem One Long Term Goal  Discussed with the patient concerning diet, and medication adherence     Plan: [place] will contact patient in the month of September at the patient request, patient agrees to next outreach.  Lazaro Arms RN, BSN, [place] Direct Dial:  [phone]  Fax: [phone]

## 2018-03-21 DIAGNOSIS — I120 Hypertensive chronic kidney disease with stage 5 chronic kidney disease or end stage renal disease: Secondary | ICD-10-CM | POA: Diagnosis not present

## 2018-03-21 DIAGNOSIS — N186 End stage renal disease: Secondary | ICD-10-CM | POA: Diagnosis not present

## 2018-03-21 DIAGNOSIS — Z992 Dependence on renal dialysis: Secondary | ICD-10-CM | POA: Diagnosis not present

## 2018-03-22 DIAGNOSIS — D631 Anemia in chronic kidney disease: Secondary | ICD-10-CM | POA: Diagnosis not present

## 2018-03-22 DIAGNOSIS — N2581 Secondary hyperparathyroidism of renal origin: Secondary | ICD-10-CM | POA: Diagnosis not present

## 2018-03-22 DIAGNOSIS — Z23 Encounter for immunization: Secondary | ICD-10-CM | POA: Diagnosis not present

## 2018-03-22 DIAGNOSIS — N186 End stage renal disease: Secondary | ICD-10-CM | POA: Diagnosis not present

## 2018-03-24 DIAGNOSIS — N2581 Secondary hyperparathyroidism of renal origin: Secondary | ICD-10-CM | POA: Diagnosis not present

## 2018-03-24 DIAGNOSIS — D631 Anemia in chronic kidney disease: Secondary | ICD-10-CM | POA: Diagnosis not present

## 2018-03-24 DIAGNOSIS — Z23 Encounter for immunization: Secondary | ICD-10-CM | POA: Diagnosis not present

## 2018-03-24 DIAGNOSIS — N186 End stage renal disease: Secondary | ICD-10-CM | POA: Diagnosis not present

## 2018-03-26 DIAGNOSIS — N2581 Secondary hyperparathyroidism of renal origin: Secondary | ICD-10-CM | POA: Diagnosis not present

## 2018-03-26 DIAGNOSIS — Z23 Encounter for immunization: Secondary | ICD-10-CM | POA: Diagnosis not present

## 2018-03-26 DIAGNOSIS — N186 End stage renal disease: Secondary | ICD-10-CM | POA: Diagnosis not present

## 2018-03-26 DIAGNOSIS — D631 Anemia in chronic kidney disease: Secondary | ICD-10-CM | POA: Diagnosis not present

## 2018-03-29 DIAGNOSIS — N186 End stage renal disease: Secondary | ICD-10-CM | POA: Diagnosis not present

## 2018-03-29 DIAGNOSIS — N2581 Secondary hyperparathyroidism of renal origin: Secondary | ICD-10-CM | POA: Diagnosis not present

## 2018-03-29 DIAGNOSIS — D631 Anemia in chronic kidney disease: Secondary | ICD-10-CM | POA: Diagnosis not present

## 2018-03-29 DIAGNOSIS — Z23 Encounter for immunization: Secondary | ICD-10-CM | POA: Diagnosis not present

## 2018-03-31 DIAGNOSIS — D631 Anemia in chronic kidney disease: Secondary | ICD-10-CM | POA: Diagnosis not present

## 2018-03-31 DIAGNOSIS — N186 End stage renal disease: Secondary | ICD-10-CM | POA: Diagnosis not present

## 2018-03-31 DIAGNOSIS — N2581 Secondary hyperparathyroidism of renal origin: Secondary | ICD-10-CM | POA: Diagnosis not present

## 2018-03-31 DIAGNOSIS — Z23 Encounter for immunization: Secondary | ICD-10-CM | POA: Diagnosis not present

## 2018-04-01 MED FILL — ZIDOVUDINE 300 MG TABLET: 300 | 30 days supply | Qty: 30 | Fill #8

## 2018-04-01 MED FILL — JULUCA 50-25 MG TAB: 50-25 | 30 days supply | Qty: 30 | Fill #8

## 2018-04-02 DIAGNOSIS — N2581 Secondary hyperparathyroidism of renal origin: Secondary | ICD-10-CM | POA: Diagnosis not present

## 2018-04-02 DIAGNOSIS — Z23 Encounter for immunization: Secondary | ICD-10-CM | POA: Diagnosis not present

## 2018-04-02 DIAGNOSIS — N186 End stage renal disease: Secondary | ICD-10-CM | POA: Diagnosis not present

## 2018-04-02 DIAGNOSIS — D631 Anemia in chronic kidney disease: Secondary | ICD-10-CM | POA: Diagnosis not present

## 2018-04-05 DIAGNOSIS — D631 Anemia in chronic kidney disease: Secondary | ICD-10-CM | POA: Diagnosis not present

## 2018-04-05 DIAGNOSIS — N2581 Secondary hyperparathyroidism of renal origin: Secondary | ICD-10-CM | POA: Diagnosis not present

## 2018-04-05 DIAGNOSIS — N186 End stage renal disease: Secondary | ICD-10-CM | POA: Diagnosis not present

## 2018-04-05 DIAGNOSIS — Z23 Encounter for immunization: Secondary | ICD-10-CM | POA: Diagnosis not present

## 2018-04-07 DIAGNOSIS — D631 Anemia in chronic kidney disease: Secondary | ICD-10-CM | POA: Diagnosis not present

## 2018-04-07 DIAGNOSIS — N186 End stage renal disease: Secondary | ICD-10-CM | POA: Diagnosis not present

## 2018-04-07 DIAGNOSIS — N2581 Secondary hyperparathyroidism of renal origin: Secondary | ICD-10-CM | POA: Diagnosis not present

## 2018-04-07 DIAGNOSIS — Z23 Encounter for immunization: Secondary | ICD-10-CM | POA: Diagnosis not present

## 2018-04-09 DIAGNOSIS — D631 Anemia in chronic kidney disease: Secondary | ICD-10-CM | POA: Diagnosis not present

## 2018-04-09 DIAGNOSIS — N2581 Secondary hyperparathyroidism of renal origin: Secondary | ICD-10-CM | POA: Diagnosis not present

## 2018-04-09 DIAGNOSIS — Z23 Encounter for immunization: Secondary | ICD-10-CM | POA: Diagnosis not present

## 2018-04-09 DIAGNOSIS — N186 End stage renal disease: Secondary | ICD-10-CM | POA: Diagnosis not present

## 2018-04-12 DIAGNOSIS — D631 Anemia in chronic kidney disease: Secondary | ICD-10-CM | POA: Diagnosis not present

## 2018-04-12 DIAGNOSIS — Z23 Encounter for immunization: Secondary | ICD-10-CM | POA: Diagnosis not present

## 2018-04-12 DIAGNOSIS — N186 End stage renal disease: Secondary | ICD-10-CM | POA: Diagnosis not present

## 2018-04-12 DIAGNOSIS — N2581 Secondary hyperparathyroidism of renal origin: Secondary | ICD-10-CM | POA: Diagnosis not present

## 2018-04-14 DIAGNOSIS — Z23 Encounter for immunization: Secondary | ICD-10-CM | POA: Diagnosis not present

## 2018-04-14 DIAGNOSIS — D631 Anemia in chronic kidney disease: Secondary | ICD-10-CM | POA: Diagnosis not present

## 2018-04-14 DIAGNOSIS — N186 End stage renal disease: Secondary | ICD-10-CM | POA: Diagnosis not present

## 2018-04-14 DIAGNOSIS — N2581 Secondary hyperparathyroidism of renal origin: Secondary | ICD-10-CM | POA: Diagnosis not present

## 2018-04-16 DIAGNOSIS — Z23 Encounter for immunization: Secondary | ICD-10-CM | POA: Diagnosis not present

## 2018-04-16 DIAGNOSIS — D631 Anemia in chronic kidney disease: Secondary | ICD-10-CM | POA: Diagnosis not present

## 2018-04-16 DIAGNOSIS — N186 End stage renal disease: Secondary | ICD-10-CM | POA: Diagnosis not present

## 2018-04-16 DIAGNOSIS — N2581 Secondary hyperparathyroidism of renal origin: Secondary | ICD-10-CM | POA: Diagnosis not present

## 2018-04-19 ENCOUNTER — Other Ambulatory Visit: Payer: Self-pay

## 2018-04-19 DIAGNOSIS — Z23 Encounter for immunization: Secondary | ICD-10-CM | POA: Diagnosis not present

## 2018-04-19 DIAGNOSIS — N186 End stage renal disease: Secondary | ICD-10-CM | POA: Diagnosis not present

## 2018-04-19 DIAGNOSIS — D631 Anemia in chronic kidney disease: Secondary | ICD-10-CM | POA: Diagnosis not present

## 2018-04-19 DIAGNOSIS — N2581 Secondary hyperparathyroidism of renal origin: Secondary | ICD-10-CM | POA: Diagnosis not present

## 2018-04-19 NOTE — Patient Outreach (Signed)
[facility]) Care Management  04/19/2018  Darrell Johnson 1965/11/02 [id number]    1st attempt to outreach the patient for assessment. No answer. Unable to leave a message.  Voice message stated that the "phone was unavailable at this time call back later."  Plan: [place] will make another outreach attempt to the patient within thirty days.  Lazaro Arms RN, BSN, [place] Direct Dial:  [phone]  Fax: [phone]

## 2018-04-20 DIAGNOSIS — I120 Hypertensive chronic kidney disease with stage 5 chronic kidney disease or end stage renal disease: Secondary | ICD-10-CM | POA: Diagnosis not present

## 2018-04-20 DIAGNOSIS — N186 End stage renal disease: Secondary | ICD-10-CM | POA: Diagnosis not present

## 2018-04-20 DIAGNOSIS — Z992 Dependence on renal dialysis: Secondary | ICD-10-CM | POA: Diagnosis not present

## 2018-04-21 DIAGNOSIS — N186 End stage renal disease: Secondary | ICD-10-CM | POA: Diagnosis not present

## 2018-04-21 DIAGNOSIS — N2581 Secondary hyperparathyroidism of renal origin: Secondary | ICD-10-CM | POA: Diagnosis not present

## 2018-04-21 DIAGNOSIS — D631 Anemia in chronic kidney disease: Secondary | ICD-10-CM | POA: Diagnosis not present

## 2018-04-21 DIAGNOSIS — D509 Iron deficiency anemia, unspecified: Secondary | ICD-10-CM | POA: Diagnosis not present

## 2018-04-23 DIAGNOSIS — D509 Iron deficiency anemia, unspecified: Secondary | ICD-10-CM | POA: Diagnosis not present

## 2018-04-23 DIAGNOSIS — D631 Anemia in chronic kidney disease: Secondary | ICD-10-CM | POA: Diagnosis not present

## 2018-04-23 DIAGNOSIS — N186 End stage renal disease: Secondary | ICD-10-CM | POA: Diagnosis not present

## 2018-04-23 DIAGNOSIS — N2581 Secondary hyperparathyroidism of renal origin: Secondary | ICD-10-CM | POA: Diagnosis not present

## 2018-04-26 DIAGNOSIS — N186 End stage renal disease: Secondary | ICD-10-CM | POA: Diagnosis not present

## 2018-04-26 DIAGNOSIS — D631 Anemia in chronic kidney disease: Secondary | ICD-10-CM | POA: Diagnosis not present

## 2018-04-26 DIAGNOSIS — N2581 Secondary hyperparathyroidism of renal origin: Secondary | ICD-10-CM | POA: Diagnosis not present

## 2018-04-26 DIAGNOSIS — D509 Iron deficiency anemia, unspecified: Secondary | ICD-10-CM | POA: Diagnosis not present

## 2018-04-28 DIAGNOSIS — N2581 Secondary hyperparathyroidism of renal origin: Secondary | ICD-10-CM | POA: Diagnosis not present

## 2018-04-28 DIAGNOSIS — D631 Anemia in chronic kidney disease: Secondary | ICD-10-CM | POA: Diagnosis not present

## 2018-04-28 DIAGNOSIS — N186 End stage renal disease: Secondary | ICD-10-CM | POA: Diagnosis not present

## 2018-04-28 DIAGNOSIS — D509 Iron deficiency anemia, unspecified: Secondary | ICD-10-CM | POA: Diagnosis not present

## 2018-04-30 DIAGNOSIS — D509 Iron deficiency anemia, unspecified: Secondary | ICD-10-CM | POA: Diagnosis not present

## 2018-04-30 DIAGNOSIS — N2581 Secondary hyperparathyroidism of renal origin: Secondary | ICD-10-CM | POA: Diagnosis not present

## 2018-04-30 DIAGNOSIS — N186 End stage renal disease: Secondary | ICD-10-CM | POA: Diagnosis not present

## 2018-04-30 DIAGNOSIS — D631 Anemia in chronic kidney disease: Secondary | ICD-10-CM | POA: Diagnosis not present

## 2018-05-03 DIAGNOSIS — D509 Iron deficiency anemia, unspecified: Secondary | ICD-10-CM | POA: Diagnosis not present

## 2018-05-03 DIAGNOSIS — D631 Anemia in chronic kidney disease: Secondary | ICD-10-CM | POA: Diagnosis not present

## 2018-05-03 DIAGNOSIS — N2581 Secondary hyperparathyroidism of renal origin: Secondary | ICD-10-CM | POA: Diagnosis not present

## 2018-05-03 DIAGNOSIS — N186 End stage renal disease: Secondary | ICD-10-CM | POA: Diagnosis not present

## 2018-05-05 DIAGNOSIS — N186 End stage renal disease: Secondary | ICD-10-CM | POA: Diagnosis not present

## 2018-05-05 DIAGNOSIS — D509 Iron deficiency anemia, unspecified: Secondary | ICD-10-CM | POA: Diagnosis not present

## 2018-05-05 DIAGNOSIS — D631 Anemia in chronic kidney disease: Secondary | ICD-10-CM | POA: Diagnosis not present

## 2018-05-05 DIAGNOSIS — N2581 Secondary hyperparathyroidism of renal origin: Secondary | ICD-10-CM | POA: Diagnosis not present

## 2018-05-07 DIAGNOSIS — N2581 Secondary hyperparathyroidism of renal origin: Secondary | ICD-10-CM | POA: Diagnosis not present

## 2018-05-07 DIAGNOSIS — D631 Anemia in chronic kidney disease: Secondary | ICD-10-CM | POA: Diagnosis not present

## 2018-05-07 DIAGNOSIS — D509 Iron deficiency anemia, unspecified: Secondary | ICD-10-CM | POA: Diagnosis not present

## 2018-05-07 DIAGNOSIS — N186 End stage renal disease: Secondary | ICD-10-CM | POA: Diagnosis not present

## 2018-05-10 DIAGNOSIS — D631 Anemia in chronic kidney disease: Secondary | ICD-10-CM | POA: Diagnosis not present

## 2018-05-10 DIAGNOSIS — D509 Iron deficiency anemia, unspecified: Secondary | ICD-10-CM | POA: Diagnosis not present

## 2018-05-10 DIAGNOSIS — N2581 Secondary hyperparathyroidism of renal origin: Secondary | ICD-10-CM | POA: Diagnosis not present

## 2018-05-10 DIAGNOSIS — N186 End stage renal disease: Secondary | ICD-10-CM | POA: Diagnosis not present

## 2018-05-12 DIAGNOSIS — D509 Iron deficiency anemia, unspecified: Secondary | ICD-10-CM | POA: Diagnosis not present

## 2018-05-12 DIAGNOSIS — N186 End stage renal disease: Secondary | ICD-10-CM | POA: Diagnosis not present

## 2018-05-12 DIAGNOSIS — D631 Anemia in chronic kidney disease: Secondary | ICD-10-CM | POA: Diagnosis not present

## 2018-05-12 DIAGNOSIS — N2581 Secondary hyperparathyroidism of renal origin: Secondary | ICD-10-CM | POA: Diagnosis not present

## 2018-05-14 DIAGNOSIS — D631 Anemia in chronic kidney disease: Secondary | ICD-10-CM | POA: Diagnosis not present

## 2018-05-14 DIAGNOSIS — D509 Iron deficiency anemia, unspecified: Secondary | ICD-10-CM | POA: Diagnosis not present

## 2018-05-14 DIAGNOSIS — N2581 Secondary hyperparathyroidism of renal origin: Secondary | ICD-10-CM | POA: Diagnosis not present

## 2018-05-14 DIAGNOSIS — N186 End stage renal disease: Secondary | ICD-10-CM | POA: Diagnosis not present

## 2018-05-17 DIAGNOSIS — N186 End stage renal disease: Secondary | ICD-10-CM | POA: Diagnosis not present

## 2018-05-17 DIAGNOSIS — D631 Anemia in chronic kidney disease: Secondary | ICD-10-CM | POA: Diagnosis not present

## 2018-05-17 DIAGNOSIS — D509 Iron deficiency anemia, unspecified: Secondary | ICD-10-CM | POA: Diagnosis not present

## 2018-05-17 DIAGNOSIS — N2581 Secondary hyperparathyroidism of renal origin: Secondary | ICD-10-CM | POA: Diagnosis not present

## 2018-05-18 ENCOUNTER — Other Ambulatory Visit: Payer: Self-pay

## 2018-05-18 ENCOUNTER — Other Ambulatory Visit: Payer: Self-pay | Admitting: Pharmacist

## 2018-05-18 NOTE — Patient Outreach (Signed)
[facility]) Care Management  05/18/2018   Darrell Johnson March 30, 1966 [id number]  Subjective: Successful telephone call to the patient for assessment.  HIPAA verified.  The patient states that overall his blood pressures have been fine.  He states that they have been ranging between 120/68-150/83.  He states that he follows a low salt diet.  He has his blood pressure checked at least three times a week when he goes to dialysis. Reviewed medications with the patient and he states that he takes all of his medications as prescribed except for his duo neb solution.  He reports that this medication will cost him 110 dollars and he can't afford that.  Patient was notified that I will make a referral for him for medications assistance and he agreed.  The patient denies any pain or falls.  He states that he received his flu shot at dialysis last month and he has an appointment with his primary care in November.    Current Medications:  Current Outpatient Medications  Medication Sig Dispense Refill  . albuterol (PROVENTIL HFA;VENTOLIN HFA) 108 (90 Base) MCG/ACT inhaler Inhale 2 puffs into the lungs every 4 (four) hours as needed for wheezing or shortness of breath. 1 Inhaler 5  . amLODipine (NORVASC) 10 MG tablet Take 1 tablet (10 mg total) by mouth daily. 90 tablet 3  . carvedilol (COREG) 25 MG tablet Take 1 tablet (25 mg total) by mouth 2 (two) times daily with a meal. 180 tablet 3  . cloNIDine (CATAPRES) 0.1 MG tablet TAKE ONE TABLET IF BLOOD PRESSURE IS 180/110 MMHG OR GREATER. MAY TAKE ONE PILL UP TO TWICE PER DAY. 60 tablet 3  . Dolutegravir-Rilpivirine (JULUCA) 50-25 MG TABS Take 1 tablet by mouth daily with supper. 30 tablet 5  . doxazosin (CARDURA) 8 MG tablet Take 1 tablet (8 mg total) by mouth at bedtime. 90 tablet 3  . ferric citrate (AURYXIA) 1 GM 210 MG(Fe) tablet Take 2 tablets (420 mg total) by mouth 2 (two) times daily with a meal. 120 tablet 0  . montelukast  (SINGULAIR) 10 MG tablet Take 1 tablet (10 mg total) by mouth at bedtime. 30 tablet 2  . zidovudine (RETROVIR) 300 MG tablet Take 1 tablet (300 mg total) by mouth daily. 30 tablet 5  . Calcium Carbonate Antacid (CALCIUM CARBONATE, DOSED IN MG ELEMENTAL CALCIUM,) 1250 MG/5ML SUSP Take 5 mLs (500 mg of elemental calcium total) by mouth every 6 (six) hours as needed for indigestion. (Patient not taking: Reported on 03/19/2018) 450 mL 0  . camphor-menthol (SARNA) lotion Apply 1 application topically every 8 (eight) hours as needed for itching. (Patient not taking: Reported on 02/16/2018) 222 mL 0  . cyclobenzaprine (FLEXERIL) 10 MG tablet Take 1 tablet (10 mg total) by mouth 3 (three) times daily as needed for muscle spasms. (Patient not taking: Reported on 05/18/2018) 30 tablet 0  . ipratropium-albuterol (DUONEB) 0.5-2.5 (3) MG/3ML SOLN Take 3 mLs by nebulization every 6 (six) hours as needed. (Patient not taking: Reported on 05/18/2018) 360 mL 1  . pravastatin (PRAVACHOL) 40 MG tablet TAKE 1 TABLET (40 MG TOTAL) BY MOUTH EVERY EVENING. 90 tablet 3   No current facility-administered medications for this visit.     Functional Status:  In your present state of health, do you have any difficulty performing the following activities: 10/13/2017 08/11/2017  Hearing? N N  Vision? N N  Difficulty concentrating or making decisions? N N  Walking or climbing stairs? N N  Dressing or bathing? N N  Doing errands, shopping? N N  Preparing Food and eating ? - N  Using the Toilet? - N  In the past six months, have you accidently leaked urine? - N  Do you have problems with loss of bowel control? - N  Managing your Medications? - N  Managing your Finances? - N  Housekeeping or managing your Housekeeping? - N  Some recent data might be hidden    Fall/Depression Screening: Fall Risk  03/19/2018 10/19/2017 08/11/2017  Falls in the past year? No No No  Risk for fall due to : - - -   PHQ 2/9 Scores 10/22/2017  10/19/2017 09/10/2017 08/11/2017 08/07/2017 04/06/2017 04/02/2017  PHQ - 2 Score 0 0 0 0 0 0 0  PHQ- 9 Score 0 - 4 - - - -    Assessment: Patient will continue to benefit from health coach outreach for disease management and support.  THN CM Care Plan Problem One     Most Recent Value  THN Long Term Goal   In 60 days the patient will verbalize that his blood pressure have remianed stable in the 140/80's  THN Long Term Goal Start Date  05/18/18  Interventions for Problem One Long Term Goal  Talked with the patient about low salt diet, blood pressure monitoring and medication adherence.     Plan: RN Health Coach will contact patient in the month of December and patient agrees to next outreach.  Lazaro Arms RN, BSN, [place] Direct Dial:  [phone]  Fax: [phone]

## 2018-05-18 NOTE — Patient Outreach (Signed)
[facility]) Care Management  [place]   05/18/2018  JEANCLAUDE WENTWORTH 1966-05-25 [id number]  Reason for referral: medication assistance  Referral source: [facility] RN Lazaro Arms Referral medication(s): Duo-nebs Current insurance: Medicare / Medicaid  PMHx: HIV, ESRD on dialysis, anemia, ED, gout, hypertriglyceridemia, peripheral neuropathy, HTN, seizures, chronic pain, tobacco abuse, history of medication non-compliance  Outreach: Successful call to Mr. Williamsen today.  HIPAA identifiers verified.  Patient agreeable to review medications telephonically.  He reports he last used duo-nebs several months ago.  He reports he has the albuterol inhaler but prefers to have both nebulizer and inhaler on hand in case of emergencies.  He thinks that he last had [facility]bs [facility] at [facility].   Objective: No Known Allergies  Medications Reviewed Today    Reviewed by Rudean Haskell, RPH (Pharmacist) on 05/18/18 at 1330  Med List Status: <None>  Medication Order Taking? Sig Documenting Provider Last Dose Status Informant  albuterol (PROVENTIL HFA;VENTOLIN HFA) 108 (90 Base) MCG/ACT inhaler [id number] Yes Inhale 2 puffs into the lungs every 4 (four) hours as needed for wheezing or shortness of breath. Clent Demark, PA-C Taking Active Family Member  amLODipine (NORVASC) 10 MG tablet [id number] Yes Take 1 tablet (10 mg total) by mouth daily. Clent Demark, PA-C Taking Active   carvedilol (COREG) 25 MG tablet [id number] Yes Take 1 tablet (25 mg total) by mouth 2 (two) times daily with a meal. Clent Demark, PA-C Taking Active Family Member  cloNIDine (CATAPRES) 0.1 MG tablet [id number] No TAKE ONE TABLET IF BLOOD PRESSURE IS 180/110 MMHG OR GREATER. MAY TAKE ONE PILL UP TO TWICE PER DAY.  Patient not taking:  Reported on 05/18/2018   Clent Demark, PA-C Not Taking Active   Dolutegravir-Rilpivirine (JULUCA) 50-25 MG TABS [id number] Yes Take 1 tablet by mouth daily with  supper. Carlyle Basques, MD Taking Active   doxazosin (CARDURA) 8 MG tablet [id number] Yes Take 1 tablet (8 mg total) by mouth at bedtime. Clent Demark, PA-C Taking Active Family Member  ferric citrate (AURYXIA) 1 GM 210 MG(Fe) tablet [id number] Yes Take 2 tablets (420 mg total) by mouth 2 (two) times daily with a meal. Allie Bossier, MD Taking Active   ipratropium-albuterol (DUONEB) 0.5-2.5 (3) MG/3ML SOLN [id number] No Take 3 mLs by nebulization every 6 (six) hours as needed.  Patient not taking:  Reported on 05/18/2018   Clent Demark, PA-C Not Taking Active   montelukast (SINGULAIR) 10 MG tablet [id number] No Take 1 tablet (10 mg total) by mouth at bedtime.  Patient not taking:  Reported on 05/18/2018   Jule Ser, DO Not Taking Active Family Member           Med Note [place] Nunnery, Marva Panda Mar 29, 2017 12:40 PM)    pravastatin (PRAVACHOL) 40 MG tablet [id number] Yes Take 40 mg by mouth at bedtime. [provider] Taking Active   zidovudine (RETROVIR) 300 MG tablet [id number] Yes Take 1 tablet (300 mg total) by mouth daily. Carlyle Basques, MD Taking Active   Med List Note Burnett Harry, CPhT 03/10/17 1559): Dialysis MWF @ [address]          Assessment:  Drugs sorted by system:  Cardiovascular: amlodipine, carvedilol, clonidine, furosemide, pravastatin  Pulmonary/Allergy: albuterol INH, ipratropium-albuterol nebulizer, montelukast  Endocrine: ferric citrate  Infectious Diseases: dolutegravir-rilpivirine, zidovudine  Genitourinary: doxazosin  Medication Review Findings:  . Medications doses appropriately for ESRD  Medication Assistance Findings:  Extra  Help:   [x]  Already receiving Full Extra Help (dual eligible + Medicaid)  []  Already receiving Partial Extra Help  []  Eligible based on reported income and assets  []  Not Eligible based on reported income and assets   [facility].  Duo-nebs prescription on file with 1  refill.  Pharmacist reports medication requires a PA through [employer].  Fax sent to Dr. Altamease Oiler.    Plan: I will f/u with patient in 1 week regarding duo-nebs and PA.   Ralene Bathe, PharmD, [place] [phone]

## 2018-05-19 DIAGNOSIS — D509 Iron deficiency anemia, unspecified: Secondary | ICD-10-CM | POA: Diagnosis not present

## 2018-05-19 DIAGNOSIS — D631 Anemia in chronic kidney disease: Secondary | ICD-10-CM | POA: Diagnosis not present

## 2018-05-19 DIAGNOSIS — N2581 Secondary hyperparathyroidism of renal origin: Secondary | ICD-10-CM | POA: Diagnosis not present

## 2018-05-19 DIAGNOSIS — N186 End stage renal disease: Secondary | ICD-10-CM | POA: Diagnosis not present

## 2018-05-20 ENCOUNTER — Other Ambulatory Visit: Payer: Self-pay

## 2018-05-20 ENCOUNTER — Other Ambulatory Visit (INDEPENDENT_AMBULATORY_CARE_PROVIDER_SITE_OTHER): Payer: Self-pay | Admitting: Physician Assistant

## 2018-05-21 ENCOUNTER — Other Ambulatory Visit (INDEPENDENT_AMBULATORY_CARE_PROVIDER_SITE_OTHER): Payer: Self-pay | Admitting: Physician Assistant

## 2018-05-21 ENCOUNTER — Telehealth (INDEPENDENT_AMBULATORY_CARE_PROVIDER_SITE_OTHER): Payer: Self-pay

## 2018-05-21 DIAGNOSIS — D509 Iron deficiency anemia, unspecified: Secondary | ICD-10-CM | POA: Diagnosis not present

## 2018-05-21 DIAGNOSIS — N186 End stage renal disease: Secondary | ICD-10-CM | POA: Diagnosis not present

## 2018-05-21 DIAGNOSIS — Z992 Dependence on renal dialysis: Secondary | ICD-10-CM | POA: Diagnosis not present

## 2018-05-21 DIAGNOSIS — N2581 Secondary hyperparathyroidism of renal origin: Secondary | ICD-10-CM | POA: Diagnosis not present

## 2018-05-21 DIAGNOSIS — I120 Hypertensive chronic kidney disease with stage 5 chronic kidney disease or end stage renal disease: Secondary | ICD-10-CM | POA: Diagnosis not present

## 2018-05-21 DIAGNOSIS — D631 Anemia in chronic kidney disease: Secondary | ICD-10-CM | POA: Diagnosis not present

## 2018-05-21 DIAGNOSIS — R0602 Shortness of breath: Secondary | ICD-10-CM

## 2018-05-21 MED ORDER — BUDESONIDE-FORMOTEROL FUMARATE 160-4.5 MCG/ACT IN AERO: 2.0000 | INHALATION_SPRAY | Freq: Two times a day (BID) | RESPIRATORY_TRACT | 5 refills | Status: DC

## 2018-05-21 MED ORDER — ALBUTEROL SULFATE HFA 108 (90 BASE) MCG/ACT IN AERS: 2.0000 | INHALATION_SPRAY | RESPIRATORY_TRACT | 5 refills | Status: DC | PRN

## 2018-05-21 NOTE — Telephone Encounter (Signed)
----- Message from Clent Demark, PA-C sent at 05/21/2018  1:03 PM EDT ----- Regarding: RE: Prior authorization for duo-nebs Thank you for looking into this . I have sent Symbicort inhaler for him to use every day as directed. A refill of albuterol was also sent and should be used only if breakthrough asthma symptoms occur. Thank you.    ----- Message ----- From: [name], CMA Sent: 05/20/2018   2:24 PM EDT To: Clent Demark, PA-C Subject: FW: Prior authorization for duo-nebs           Patient states he has asthma. States he was given a nebulizer by you. Patient was asked how many attacks he has within a week, states he uses him inhaler about every other day. There is a visit diagnosis from 08/2017 that says Moderate persistent asthma without complication. ----- Message ----- From: Clent Demark, PA-C Sent: 05/20/2018   1:40 PM EDT To: [name], CMA Subject: RE: Prior authorization for duo-nebs           Please call this patient and ask him what kind of pulmonary disease he has that warrants the use of nebulizer and inhalers. I have looked into his medical records and see he has been short of breath a few times but there is no diagnosis of underlying pulmonary disease. Thank you.    ----- Message ----- From: Frederich Cha, CPhT Sent: 05/20/2018   8:45 AM EDT To: Clent Demark, PA-C Subject: RE: Prior authorization for duo-nebs           The pharmacy should be billing his Medicare Part B for the nebs, you should resend the script with a DX code and let me know and I will call and follow up with the pharmacy.  ----- Message ----- From: Clent Demark, PA-C Sent: 05/19/2018   5:04 PM EDT To: Frederich Cha, CPhT Subject: RE: Prior authorization for duo-nebs           Hello Margaret. Did you come across this PA yet?  Thanks.  Roger   ----- Message ----- From: Rudean Haskell, [facility] Sent: 05/19/2018   9:15 AM EDT To: Clent Demark, PA-C Subject: RE: Prior authorization for duo-nebs           Thank you!  I will follow-up next week with pharmacy and patient to see if PA processed by Medicare yet.    ----- Message ----- From: Clent Demark, PA-C Sent: 05/19/2018   8:45 AM EDT To: Rudean Haskell, RPH Subject: RE: Prior authorization for duo-nebs           Hello, I will send out as soon as I receive PA request. Usually, if I don't receive PA personally, Feliz Beam at [facility] is completing for me. Thank you.  [name] PA-C   ----- Message ----- From: Rudean Haskell, [facility] Sent: 05/18/2018   1:56 PM EDT To: Clent Demark, PA-C Subject: Prior authorization for duo-nebs               Hi Dr. Altamease Oiler,  Prior Authorization request sent for Mr. Lona today for [facility].  Can you please complete documentation and also send in new RX [facility] to [facility] (only 1 refill remaining, will likely expire soon)?  Patient reports he has albuterol inhaler but prefers to have both inhaler and nebulizer on hand for emergencies.   Thanks so much, Ralene Bathe, PharmD,  [place] [phone]

## 2018-05-21 NOTE — Telephone Encounter (Signed)
Patient is aware that symbicort has been sent for him to use daily. Albuterol also sent but only use when breakthrough asthma symptoms occur. [name], CMA

## 2018-05-24 DIAGNOSIS — D631 Anemia in chronic kidney disease: Secondary | ICD-10-CM | POA: Diagnosis not present

## 2018-05-24 DIAGNOSIS — N2581 Secondary hyperparathyroidism of renal origin: Secondary | ICD-10-CM | POA: Diagnosis not present

## 2018-05-24 DIAGNOSIS — N186 End stage renal disease: Secondary | ICD-10-CM | POA: Diagnosis not present

## 2018-05-24 DIAGNOSIS — D509 Iron deficiency anemia, unspecified: Secondary | ICD-10-CM | POA: Diagnosis not present

## 2018-05-25 ENCOUNTER — Ambulatory Visit: Payer: Self-pay | Admitting: Pharmacist

## 2018-05-25 ENCOUNTER — Other Ambulatory Visit: Payer: Self-pay | Admitting: Pharmacist

## 2018-05-25 MED FILL — ZIDOVUDINE 300 MG TABLET: 300 | 30 days supply | Qty: 30 | Fill #9

## 2018-05-25 MED FILL — JULUCA 50-25 MG TAB: 50-25 | 30 days supply | Qty: 30 | Fill #9

## 2018-05-25 NOTE — Patient Outreach (Addendum)
[facility]) Care Management  [place]  05/25/2018  Darrell Johnson [birth year] [id number]   Reason for call: Follow-up on ipratropium-albuterol nebulizer PA approval  Saw notes from PA that new inhaler, Symbicort, prescribed for patient.    Unsuccessful telephone call attempt #1 to patient.   Unable to leave message  Plan:  I will make another outreach attempt to patient within 3-4 business days   Ralene Bathe, PharmD, [place] [phone]  Incoming call received from patient.  Patient reports that he is no longer supposed to be taking the nebulizers and has been switched to additional inhalers:  Ventolin + Symbicort.  We reviewed dosing instructions for both inhalers and importance of rinsing mouth out after taking Symbicort.  Patient voiced understanding.  He also reports that he only has 1 dose left of Zidovudine and Dolutegravir-Rilpivirine.  He has been unable to call for refills because his phone has been broken.    3-way call placed to Darrell Johnson.  Medications will be shipped to patient via UPS and will be delivered before 10AM tomorrow morning.  Patient voiced understanding.    Patient denies further medication needs at this time.  I will close [facility] pharmacy case.   Thank you for allowing [facility] pharmacy to be involved in this patient's care.    Ralene Bathe, PharmD, [place] [phone]

## 2018-05-26 DIAGNOSIS — N2581 Secondary hyperparathyroidism of renal origin: Secondary | ICD-10-CM | POA: Diagnosis not present

## 2018-05-26 DIAGNOSIS — D509 Iron deficiency anemia, unspecified: Secondary | ICD-10-CM | POA: Diagnosis not present

## 2018-05-26 DIAGNOSIS — N186 End stage renal disease: Secondary | ICD-10-CM | POA: Diagnosis not present

## 2018-05-26 DIAGNOSIS — D631 Anemia in chronic kidney disease: Secondary | ICD-10-CM | POA: Diagnosis not present

## 2018-05-28 DIAGNOSIS — N2581 Secondary hyperparathyroidism of renal origin: Secondary | ICD-10-CM | POA: Diagnosis not present

## 2018-05-28 DIAGNOSIS — D509 Iron deficiency anemia, unspecified: Secondary | ICD-10-CM | POA: Diagnosis not present

## 2018-05-28 DIAGNOSIS — N186 End stage renal disease: Secondary | ICD-10-CM | POA: Diagnosis not present

## 2018-05-28 DIAGNOSIS — D631 Anemia in chronic kidney disease: Secondary | ICD-10-CM | POA: Diagnosis not present

## 2018-06-01 ENCOUNTER — Ambulatory Visit: Payer: Self-pay | Admitting: Pharmacist

## 2018-06-01 DIAGNOSIS — N186 End stage renal disease: Secondary | ICD-10-CM | POA: Diagnosis not present

## 2018-06-01 DIAGNOSIS — D631 Anemia in chronic kidney disease: Secondary | ICD-10-CM | POA: Diagnosis not present

## 2018-06-01 DIAGNOSIS — N2581 Secondary hyperparathyroidism of renal origin: Secondary | ICD-10-CM | POA: Diagnosis not present

## 2018-06-01 DIAGNOSIS — D509 Iron deficiency anemia, unspecified: Secondary | ICD-10-CM | POA: Diagnosis not present

## 2018-06-02 DIAGNOSIS — N2581 Secondary hyperparathyroidism of renal origin: Secondary | ICD-10-CM | POA: Diagnosis not present

## 2018-06-02 DIAGNOSIS — D509 Iron deficiency anemia, unspecified: Secondary | ICD-10-CM | POA: Diagnosis not present

## 2018-06-02 DIAGNOSIS — D631 Anemia in chronic kidney disease: Secondary | ICD-10-CM | POA: Diagnosis not present

## 2018-06-02 DIAGNOSIS — N186 End stage renal disease: Secondary | ICD-10-CM | POA: Diagnosis not present

## 2018-06-04 DIAGNOSIS — D631 Anemia in chronic kidney disease: Secondary | ICD-10-CM | POA: Diagnosis not present

## 2018-06-04 DIAGNOSIS — D509 Iron deficiency anemia, unspecified: Secondary | ICD-10-CM | POA: Diagnosis not present

## 2018-06-04 DIAGNOSIS — N186 End stage renal disease: Secondary | ICD-10-CM | POA: Diagnosis not present

## 2018-06-04 DIAGNOSIS — N2581 Secondary hyperparathyroidism of renal origin: Secondary | ICD-10-CM | POA: Diagnosis not present

## 2018-06-07 DIAGNOSIS — N2581 Secondary hyperparathyroidism of renal origin: Secondary | ICD-10-CM | POA: Diagnosis not present

## 2018-06-07 DIAGNOSIS — D509 Iron deficiency anemia, unspecified: Secondary | ICD-10-CM | POA: Diagnosis not present

## 2018-06-07 DIAGNOSIS — D631 Anemia in chronic kidney disease: Secondary | ICD-10-CM | POA: Diagnosis not present

## 2018-06-07 DIAGNOSIS — N186 End stage renal disease: Secondary | ICD-10-CM | POA: Diagnosis not present

## 2018-06-08 ENCOUNTER — Ambulatory Visit (INDEPENDENT_AMBULATORY_CARE_PROVIDER_SITE_OTHER): Payer: Medicare Other | Admitting: Physician Assistant

## 2018-06-09 DIAGNOSIS — D509 Iron deficiency anemia, unspecified: Secondary | ICD-10-CM | POA: Diagnosis not present

## 2018-06-09 DIAGNOSIS — D631 Anemia in chronic kidney disease: Secondary | ICD-10-CM | POA: Diagnosis not present

## 2018-06-09 DIAGNOSIS — N186 End stage renal disease: Secondary | ICD-10-CM | POA: Diagnosis not present

## 2018-06-09 DIAGNOSIS — N2581 Secondary hyperparathyroidism of renal origin: Secondary | ICD-10-CM | POA: Diagnosis not present

## 2018-06-10 ENCOUNTER — Ambulatory Visit (INDEPENDENT_AMBULATORY_CARE_PROVIDER_SITE_OTHER): Payer: Medicare Other | Admitting: Physician Assistant

## 2018-06-11 DIAGNOSIS — N2581 Secondary hyperparathyroidism of renal origin: Secondary | ICD-10-CM | POA: Diagnosis not present

## 2018-06-11 DIAGNOSIS — D631 Anemia in chronic kidney disease: Secondary | ICD-10-CM | POA: Diagnosis not present

## 2018-06-11 DIAGNOSIS — D509 Iron deficiency anemia, unspecified: Secondary | ICD-10-CM | POA: Diagnosis not present

## 2018-06-11 DIAGNOSIS — N186 End stage renal disease: Secondary | ICD-10-CM | POA: Diagnosis not present

## 2018-06-13 DIAGNOSIS — D631 Anemia in chronic kidney disease: Secondary | ICD-10-CM | POA: Diagnosis not present

## 2018-06-13 DIAGNOSIS — N186 End stage renal disease: Secondary | ICD-10-CM | POA: Diagnosis not present

## 2018-06-13 DIAGNOSIS — N2581 Secondary hyperparathyroidism of renal origin: Secondary | ICD-10-CM | POA: Diagnosis not present

## 2018-06-13 DIAGNOSIS — D509 Iron deficiency anemia, unspecified: Secondary | ICD-10-CM | POA: Diagnosis not present

## 2018-06-15 DIAGNOSIS — N186 End stage renal disease: Secondary | ICD-10-CM | POA: Diagnosis not present

## 2018-06-15 DIAGNOSIS — D631 Anemia in chronic kidney disease: Secondary | ICD-10-CM | POA: Diagnosis not present

## 2018-06-15 DIAGNOSIS — D509 Iron deficiency anemia, unspecified: Secondary | ICD-10-CM | POA: Diagnosis not present

## 2018-06-15 DIAGNOSIS — N2581 Secondary hyperparathyroidism of renal origin: Secondary | ICD-10-CM | POA: Diagnosis not present

## 2018-06-18 DIAGNOSIS — N186 End stage renal disease: Secondary | ICD-10-CM | POA: Diagnosis not present

## 2018-06-18 DIAGNOSIS — D631 Anemia in chronic kidney disease: Secondary | ICD-10-CM | POA: Diagnosis not present

## 2018-06-18 DIAGNOSIS — D509 Iron deficiency anemia, unspecified: Secondary | ICD-10-CM | POA: Diagnosis not present

## 2018-06-18 DIAGNOSIS — N2581 Secondary hyperparathyroidism of renal origin: Secondary | ICD-10-CM | POA: Diagnosis not present

## 2018-06-20 DIAGNOSIS — Z992 Dependence on renal dialysis: Secondary | ICD-10-CM | POA: Diagnosis not present

## 2018-06-20 DIAGNOSIS — N186 End stage renal disease: Secondary | ICD-10-CM | POA: Diagnosis not present

## 2018-06-20 DIAGNOSIS — I120 Hypertensive chronic kidney disease with stage 5 chronic kidney disease or end stage renal disease: Secondary | ICD-10-CM | POA: Diagnosis not present

## 2018-06-21 DIAGNOSIS — N2581 Secondary hyperparathyroidism of renal origin: Secondary | ICD-10-CM | POA: Diagnosis not present

## 2018-06-21 DIAGNOSIS — D631 Anemia in chronic kidney disease: Secondary | ICD-10-CM | POA: Diagnosis not present

## 2018-06-21 DIAGNOSIS — N186 End stage renal disease: Secondary | ICD-10-CM | POA: Diagnosis not present

## 2018-06-21 DIAGNOSIS — Z23 Encounter for immunization: Secondary | ICD-10-CM | POA: Diagnosis not present

## 2018-06-21 DIAGNOSIS — D509 Iron deficiency anemia, unspecified: Secondary | ICD-10-CM | POA: Diagnosis not present

## 2018-06-21 MED FILL — ZIDOVUDINE 300 MG TABLET: 300 | 30 days supply | Qty: 30 | Fill #10

## 2018-06-21 MED FILL — JULUCA 50-25 MG TAB: 50-25 | 30 days supply | Qty: 30 | Fill #10

## 2018-06-23 DIAGNOSIS — D631 Anemia in chronic kidney disease: Secondary | ICD-10-CM | POA: Diagnosis not present

## 2018-06-23 DIAGNOSIS — D509 Iron deficiency anemia, unspecified: Secondary | ICD-10-CM | POA: Diagnosis not present

## 2018-06-23 DIAGNOSIS — Z23 Encounter for immunization: Secondary | ICD-10-CM | POA: Diagnosis not present

## 2018-06-23 DIAGNOSIS — N186 End stage renal disease: Secondary | ICD-10-CM | POA: Diagnosis not present

## 2018-06-23 DIAGNOSIS — N2581 Secondary hyperparathyroidism of renal origin: Secondary | ICD-10-CM | POA: Diagnosis not present

## 2018-06-25 DIAGNOSIS — D631 Anemia in chronic kidney disease: Secondary | ICD-10-CM | POA: Diagnosis not present

## 2018-06-25 DIAGNOSIS — N2581 Secondary hyperparathyroidism of renal origin: Secondary | ICD-10-CM | POA: Diagnosis not present

## 2018-06-25 DIAGNOSIS — N186 End stage renal disease: Secondary | ICD-10-CM | POA: Diagnosis not present

## 2018-06-25 DIAGNOSIS — D509 Iron deficiency anemia, unspecified: Secondary | ICD-10-CM | POA: Diagnosis not present

## 2018-06-25 DIAGNOSIS — Z23 Encounter for immunization: Secondary | ICD-10-CM | POA: Diagnosis not present

## 2018-06-28 ENCOUNTER — Ambulatory Visit (INDEPENDENT_AMBULATORY_CARE_PROVIDER_SITE_OTHER): Payer: Medicare Other | Admitting: Physician Assistant

## 2018-06-28 DIAGNOSIS — D631 Anemia in chronic kidney disease: Secondary | ICD-10-CM | POA: Diagnosis not present

## 2018-06-28 DIAGNOSIS — N186 End stage renal disease: Secondary | ICD-10-CM | POA: Diagnosis not present

## 2018-06-28 DIAGNOSIS — N2581 Secondary hyperparathyroidism of renal origin: Secondary | ICD-10-CM | POA: Diagnosis not present

## 2018-06-28 DIAGNOSIS — D509 Iron deficiency anemia, unspecified: Secondary | ICD-10-CM | POA: Diagnosis not present

## 2018-06-28 DIAGNOSIS — Z23 Encounter for immunization: Secondary | ICD-10-CM | POA: Diagnosis not present

## 2018-06-30 DIAGNOSIS — Z23 Encounter for immunization: Secondary | ICD-10-CM | POA: Diagnosis not present

## 2018-06-30 DIAGNOSIS — N2581 Secondary hyperparathyroidism of renal origin: Secondary | ICD-10-CM | POA: Diagnosis not present

## 2018-06-30 DIAGNOSIS — D509 Iron deficiency anemia, unspecified: Secondary | ICD-10-CM | POA: Diagnosis not present

## 2018-06-30 DIAGNOSIS — N186 End stage renal disease: Secondary | ICD-10-CM | POA: Diagnosis not present

## 2018-06-30 DIAGNOSIS — D631 Anemia in chronic kidney disease: Secondary | ICD-10-CM | POA: Diagnosis not present

## 2018-07-01 ENCOUNTER — Ambulatory Visit (INDEPENDENT_AMBULATORY_CARE_PROVIDER_SITE_OTHER): Payer: Medicare Other | Admitting: Physician Assistant

## 2018-07-01 ENCOUNTER — Encounter (INDEPENDENT_AMBULATORY_CARE_PROVIDER_SITE_OTHER): Payer: Self-pay | Admitting: Physician Assistant

## 2018-07-01 ENCOUNTER — Other Ambulatory Visit: Payer: Self-pay

## 2018-07-01 VITALS — BP 131/72 | HR 76 | Temp 98.3°F | Ht 69.0 in | Wt 256.6 lb

## 2018-07-01 DIAGNOSIS — J323 Chronic sphenoidal sinusitis: Secondary | ICD-10-CM

## 2018-07-01 MED ORDER — OXYMETAZOLINE HCL 0.05 % NA SOLN: 1.0000 | Freq: Two times a day (BID) | NASAL | 0 refills | Status: AC

## 2018-07-01 MED ORDER — GUAIFENESIN ER 1200 MG PO TB12: 1.0000 | ORAL_TABLET | Freq: Two times a day (BID) | ORAL | 0 refills | Status: AC

## 2018-07-01 NOTE — Progress Notes (Signed)
Subjective:  Patient ID: Darrell Johnson, male    DOB: 01/12/1966  Age: 52 y.o. MRN: [id number]  CC: sinus problem  HPI Darrell Johnson a 52 y.o.malewith a medical history of anemia, anxiety, asthma, CHF, ESRD, Gout, Syphillis, HIV, HTN, HLD, PNA, rib fractures, seizures, tobacco abuse, and chronic sphenoidal sinusitispresentswith sinus pain and pressure since a couple of weeks ago. There is associated ear discomfort bilaterally, mild cough, and chest congestion. Has seen an outside provider and prescribed Augmentin and Fluticasone. Is on day three of Augmentin and has not used Fluticasone yet. Does not endorse f/c/n/v, headache, eye pain, rash, body aches. CP, SOB, abdominal pain, swelling, or GI/GU sxs.    Outpatient Medications Prior to Visit  Medication Sig Dispense Refill  . amLODipine (NORVASC) 10 MG tablet Take 1 tablet (10 mg total) by mouth daily. 90 tablet 3  . budesonide-formoterol (SYMBICORT) 160-4.5 MCG/ACT inhaler Inhale 2 puffs into the lungs 2 (two) times daily. 1 Inhaler 5  . carvedilol (COREG) 25 MG tablet Take 1 tablet (25 mg total) by mouth 2 (two) times daily with a meal. 180 tablet 3  . cloNIDine (CATAPRES) 0.1 MG tablet TAKE ONE TABLET IF BLOOD PRESSURE IS 180/110 MMHG OR GREATER. MAY TAKE ONE PILL UP TO TWICE PER DAY. 60 tablet 3  . Dolutegravir-Rilpivirine (JULUCA) 50-25 MG TABS Take 1 tablet by mouth daily with supper. 30 tablet 5  . doxazosin (CARDURA) 8 MG tablet Take 1 tablet (8 mg total) by mouth at bedtime. 90 tablet 3  . ferric citrate (AURYXIA) 1 GM 210 MG(Fe) tablet Take 2 tablets (420 mg total) by mouth 2 (two) times daily with a meal. 120 tablet 0  . furosemide (LASIX) 40 MG tablet Take 40 mg by mouth daily as needed for fluid.    . pravastatin (PRAVACHOL) 40 MG tablet Take 40 mg by mouth at bedtime.    . zidovudine (RETROVIR) 300 MG tablet Take 1 tablet (300 mg total) by mouth daily. 30 tablet 5  . albuterol (PROVENTIL HFA;VENTOLIN HFA) 108 (90  Base) MCG/ACT inhaler Inhale 2 puffs into the lungs every 4 (four) hours as needed for wheezing or shortness of breath. 1 Inhaler 5  . ipratropium-albuterol (DUONEB) 0.5-2.5 (3) MG/3ML SOLN Take 3 mLs by nebulization every 6 (six) hours as needed. (Patient not taking: Reported on 05/18/2018) 360 mL 1  . montelukast (SINGULAIR) 10 MG tablet Take 1 tablet (10 mg total) by mouth at bedtime. (Patient not taking: Reported on 05/18/2018) 30 tablet 2   No facility-administered medications prior to visit.      ROS Review of Systems  Constitutional: Negative for chills, fever and malaise/fatigue.  HENT: Positive for congestion, ear pain and sinus pain.   Eyes: Negative for blurred vision.  Respiratory: Positive for cough. Negative for shortness of breath.   Cardiovascular: Negative for chest pain and palpitations.  Gastrointestinal: Negative for abdominal pain and nausea.  Genitourinary: Negative for dysuria and hematuria.  Musculoskeletal: Negative for joint pain and myalgias.  Skin: Negative for rash.  Neurological: Negative for tingling and headaches.  Psychiatric/Behavioral: Negative for depression. The patient is not nervous/anxious.     Objective:  BP 131/72 (BP Location: Left Arm, Patient Position: Sitting, Cuff Size: Large)   Pulse 76   Temp 98.3 F (36.8 C) (Oral)   Ht 5\' 9"  (1.753 m)   Wt 256 lb 9.6 oz (116.4 kg)   SpO2 95%   BMI 37.89 kg/m   BP/Weight 07/01/2018 4/56/2563 02/26/3733  Systolic  BP 341 937 902  Diastolic BP 72 94 81  Wt. (Lbs) 256.6 249 249  BMI 37.89 36.77 36.77      Physical Exam Vitals signs reviewed.  Constitutional:      Comments: Well developed, well nourished, NAD, polite  HENT:     Head: Normocephalic and atraumatic.     Comments: Mild TTP over the lateral aspects of the bridge of nose, no erythema or edema.    Right Ear: Tympanic membrane normal.     Left Ear: Tympanic membrane normal.     Nose: Congestion present.  Eyes:     General: No  scleral icterus.    Conjunctiva/sclera: Conjunctivae normal.  Neck:     Musculoskeletal: Normal range of motion and neck supple.     Thyroid: No thyromegaly.  Cardiovascular:     Rate and Rhythm: Normal rate and regular rhythm.     Heart sounds: Normal heart sounds.  Pulmonary:     Effort: Pulmonary effort is normal. No respiratory distress.     Breath sounds: Normal breath sounds. No stridor. No wheezing, rhonchi or rales.  Skin:    General: Skin is warm and dry.     Coloration: Skin is not pale.     Findings: No erythema or rash.  Neurological:     Mental Status: He is alert and oriented to person, place, and time.  Psychiatric:        Mood and Affect: Mood normal.        Behavior: Behavior normal.        Thought Content: Thought content normal.      Assessment & Plan:   1. Chronic sphenoidal sinusitis - Ambulatory referral to ENT - Begin Guaifenesin (MUCINEX MAXIMUM STRENGTH) 1200 MG TB12; Take 1 tablet (1,200 mg total) by mouth 2 (two) times daily for 5 days.  Dispense: 10 tablet; Refill: 0 - Begin oxymetazoline (AFRIN) 0.05 % nasal spray; Place 1 spray into both nostrils 2 (two) times daily for 3 days. DO NOT USE FOR MORE THAN THREE CONSECUTIVE DAYS!!  Dispense: 15 mL; Refill: 0   Meds ordered this encounter  Medications  . Guaifenesin (MUCINEX MAXIMUM STRENGTH) 1200 MG TB12    Sig: Take 1 tablet (1,200 mg total) by mouth 2 (two) times daily for 5 days.    Dispense:  10 tablet    Refill:  0    Order Specific Question:   Supervising Provider    Answer:   Charlott Rakes [[id number]]  . oxymetazoline (AFRIN) 0.05 % nasal spray    Sig: Place 1 spray into both nostrils 2 (two) times daily for 3 days. DO NOT USE FOR MORE THAN THREE CONSECUTIVE DAYS!!    Dispense:  15 mL    Refill:  0    Order Specific Question:   Supervising Provider    Answer:   Charlott Rakes [[id number]]    Follow-up: Return in about 2 months (around 09/01/2018) for General health f/u.   Clent Demark  PA

## 2018-07-01 NOTE — Patient Instructions (Signed)

## 2018-07-02 DIAGNOSIS — D631 Anemia in chronic kidney disease: Secondary | ICD-10-CM | POA: Diagnosis not present

## 2018-07-02 DIAGNOSIS — N186 End stage renal disease: Secondary | ICD-10-CM | POA: Diagnosis not present

## 2018-07-02 DIAGNOSIS — N2581 Secondary hyperparathyroidism of renal origin: Secondary | ICD-10-CM | POA: Diagnosis not present

## 2018-07-02 DIAGNOSIS — Z23 Encounter for immunization: Secondary | ICD-10-CM | POA: Diagnosis not present

## 2018-07-02 DIAGNOSIS — D509 Iron deficiency anemia, unspecified: Secondary | ICD-10-CM | POA: Diagnosis not present

## 2018-07-05 DIAGNOSIS — N2581 Secondary hyperparathyroidism of renal origin: Secondary | ICD-10-CM | POA: Diagnosis not present

## 2018-07-05 DIAGNOSIS — Z23 Encounter for immunization: Secondary | ICD-10-CM | POA: Diagnosis not present

## 2018-07-05 DIAGNOSIS — N186 End stage renal disease: Secondary | ICD-10-CM | POA: Diagnosis not present

## 2018-07-05 DIAGNOSIS — D631 Anemia in chronic kidney disease: Secondary | ICD-10-CM | POA: Diagnosis not present

## 2018-07-05 DIAGNOSIS — D509 Iron deficiency anemia, unspecified: Secondary | ICD-10-CM | POA: Diagnosis not present

## 2018-07-07 DIAGNOSIS — Z23 Encounter for immunization: Secondary | ICD-10-CM | POA: Diagnosis not present

## 2018-07-07 DIAGNOSIS — N2581 Secondary hyperparathyroidism of renal origin: Secondary | ICD-10-CM | POA: Diagnosis not present

## 2018-07-07 DIAGNOSIS — D631 Anemia in chronic kidney disease: Secondary | ICD-10-CM | POA: Diagnosis not present

## 2018-07-07 DIAGNOSIS — D509 Iron deficiency anemia, unspecified: Secondary | ICD-10-CM | POA: Diagnosis not present

## 2018-07-07 DIAGNOSIS — N186 End stage renal disease: Secondary | ICD-10-CM | POA: Diagnosis not present

## 2018-07-08 DIAGNOSIS — J329 Chronic sinusitis, unspecified: Secondary | ICD-10-CM | POA: Diagnosis not present

## 2018-07-08 DIAGNOSIS — J324 Chronic pansinusitis: Secondary | ICD-10-CM | POA: Diagnosis not present

## 2018-07-09 ENCOUNTER — Encounter (INDEPENDENT_AMBULATORY_CARE_PROVIDER_SITE_OTHER): Payer: Self-pay | Admitting: Physician Assistant

## 2018-07-09 ENCOUNTER — Ambulatory Visit (INDEPENDENT_AMBULATORY_CARE_PROVIDER_SITE_OTHER): Payer: Medicare Other | Admitting: Physician Assistant

## 2018-07-09 ENCOUNTER — Other Ambulatory Visit: Payer: Self-pay

## 2018-07-09 VITALS — BP 163/89 | HR 85 | Temp 98.2°F | Ht 69.0 in | Wt 260.4 lb

## 2018-07-09 DIAGNOSIS — Z029 Encounter for administrative examinations, unspecified: Secondary | ICD-10-CM

## 2018-07-09 DIAGNOSIS — Z23 Encounter for immunization: Secondary | ICD-10-CM | POA: Diagnosis not present

## 2018-07-09 DIAGNOSIS — D631 Anemia in chronic kidney disease: Secondary | ICD-10-CM | POA: Diagnosis not present

## 2018-07-09 DIAGNOSIS — N2581 Secondary hyperparathyroidism of renal origin: Secondary | ICD-10-CM | POA: Diagnosis not present

## 2018-07-09 DIAGNOSIS — N186 End stage renal disease: Secondary | ICD-10-CM | POA: Diagnosis not present

## 2018-07-09 DIAGNOSIS — D509 Iron deficiency anemia, unspecified: Secondary | ICD-10-CM | POA: Diagnosis not present
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
## 2018-07-11 DIAGNOSIS — N186 End stage renal disease: Secondary | ICD-10-CM | POA: Diagnosis not present

## 2018-07-11 DIAGNOSIS — D509 Iron deficiency anemia, unspecified: Secondary | ICD-10-CM | POA: Diagnosis not present

## 2018-07-11 DIAGNOSIS — D631 Anemia in chronic kidney disease: Secondary | ICD-10-CM | POA: Diagnosis not present

## 2018-07-11 DIAGNOSIS — N2581 Secondary hyperparathyroidism of renal origin: Secondary | ICD-10-CM | POA: Diagnosis not present

## 2018-07-11 DIAGNOSIS — Z23 Encounter for immunization: Secondary | ICD-10-CM | POA: Diagnosis not present

## 2018-07-13 DIAGNOSIS — N2581 Secondary hyperparathyroidism of renal origin: Secondary | ICD-10-CM | POA: Diagnosis not present

## 2018-07-13 DIAGNOSIS — N186 End stage renal disease: Secondary | ICD-10-CM | POA: Diagnosis not present

## 2018-07-13 DIAGNOSIS — D509 Iron deficiency anemia, unspecified: Secondary | ICD-10-CM | POA: Diagnosis not present

## 2018-07-13 DIAGNOSIS — D631 Anemia in chronic kidney disease: Secondary | ICD-10-CM | POA: Diagnosis not present

## 2018-07-13 DIAGNOSIS — Z23 Encounter for immunization: Secondary | ICD-10-CM | POA: Diagnosis not present

## 2018-07-16 DIAGNOSIS — Z23 Encounter for immunization: Secondary | ICD-10-CM | POA: Diagnosis not present

## 2018-07-16 DIAGNOSIS — D509 Iron deficiency anemia, unspecified: Secondary | ICD-10-CM | POA: Diagnosis not present

## 2018-07-16 DIAGNOSIS — N2581 Secondary hyperparathyroidism of renal origin: Secondary | ICD-10-CM | POA: Diagnosis not present

## 2018-07-16 DIAGNOSIS — D631 Anemia in chronic kidney disease: Secondary | ICD-10-CM | POA: Diagnosis not present

## 2018-07-16 DIAGNOSIS — N186 End stage renal disease: Secondary | ICD-10-CM | POA: Diagnosis not present

## 2018-07-18 DIAGNOSIS — N186 End stage renal disease: Secondary | ICD-10-CM | POA: Diagnosis not present

## 2018-07-18 DIAGNOSIS — D509 Iron deficiency anemia, unspecified: Secondary | ICD-10-CM | POA: Diagnosis not present

## 2018-07-18 DIAGNOSIS — Z23 Encounter for immunization: Secondary | ICD-10-CM | POA: Diagnosis not present

## 2018-07-18 DIAGNOSIS — N2581 Secondary hyperparathyroidism of renal origin: Secondary | ICD-10-CM | POA: Diagnosis not present

## 2018-07-18 DIAGNOSIS — D631 Anemia in chronic kidney disease: Secondary | ICD-10-CM | POA: Diagnosis not present

## 2018-07-19 ENCOUNTER — Other Ambulatory Visit: Payer: Self-pay

## 2018-07-19 MED FILL — JULUCA 50-25 MG TAB: 50-25 | 30 days supply | Qty: 30 | Fill #11

## 2018-07-19 MED FILL — ZIDOVUDINE 300 MG TABLET: 300 | 30 days supply | Qty: 30 | Fill #11

## 2018-07-19 NOTE — Patient Outreach (Signed)
[facility]) Care Management  07/19/2018   Darrell Johnson 03-07-1966 [id number]  Subjective: Telephone call to the patient for assessment.  HIPAA verified.  The patient states that he is doing ok.  He denies any pain or falls.  He states that he has recently gotten over a sinus infection.  He is going to dialysis three days a week and has his blood pressure checked.  He states that it has been ranging systolic 294.  He could not remember what his diastolic. He state that he is monitoring the salt in his diet. He is taken his medications as prescribed.  The patient states that he will be looking for another PCP.  RN Health Coach offered number to find another PCP and the patient states that he did not have a way to write it down.  Offered to call and leave it on his voicemail but he states that he does not have one set up.   Current Medications:  Current Outpatient Medications  Medication Sig Dispense Refill  . amLODipine (NORVASC) 10 MG tablet Take 1 tablet (10 mg total) by mouth daily. 90 tablet 3  . budesonide-formoterol (SYMBICORT) 160-4.5 MCG/ACT inhaler Inhale 2 puffs into the lungs 2 (two) times daily. 1 Inhaler 5  . carvedilol (COREG) 25 MG tablet Take 1 tablet (25 mg total) by mouth 2 (two) times daily with a meal. 180 tablet 3  . Dolutegravir-Rilpivirine (JULUCA) 50-25 MG TABS Take 1 tablet by mouth daily with supper. 30 tablet 5  . doxazosin (CARDURA) 8 MG tablet Take 1 tablet (8 mg total) by mouth at bedtime. 90 tablet 3  . ferric citrate (AURYXIA) 1 GM 210 MG(Fe) tablet Take 2 tablets (420 mg total) by mouth 2 (two) times daily with a meal. 120 tablet 0  . furosemide (LASIX) 40 MG tablet Take 40 mg by mouth daily as needed for fluid.    Marland Kitchen ipratropium-albuterol (DUONEB) 0.5-2.5 (3) MG/3ML SOLN Take 3 mLs by nebulization every 6 (six) hours as needed. 360 mL 1  . pravastatin (PRAVACHOL) 40 MG tablet Take 40 mg by mouth at bedtime.    . zidovudine (RETROVIR) 300 MG  tablet Take 1 tablet (300 mg total) by mouth daily. 30 tablet 5  . albuterol (PROVENTIL HFA;VENTOLIN HFA) 108 (90 Base) MCG/ACT inhaler Inhale 2 puffs into the lungs every 4 (four) hours as needed for wheezing or shortness of breath. 1 Inhaler 5   No current facility-administered medications for this visit.     Functional Status:  In your present state of health, do you have any difficulty performing the following activities: 10/13/2017 08/11/2017  Hearing? N N  Vision? N N  Difficulty concentrating or making decisions? N N  Walking or climbing stairs? N N  Dressing or bathing? N N  Doing errands, shopping? N N  Preparing Food and eating ? - N  Using the Toilet? - N  In the past six months, have you accidently leaked urine? - N  Do you have problems with loss of bowel control? - N  Managing your Medications? - N  Managing your Finances? - N  Housekeeping or managing your Housekeeping? - N  Some recent data might be hidden    Fall/Depression Screening: Fall Risk  07/19/2018 07/09/2018 07/01/2018  Falls in the past year? 0 0 0  Risk for fall due to : - - -   PHQ 2/9 Scores 07/09/2018 07/01/2018 10/22/2017 10/19/2017 09/10/2017 08/11/2017 08/07/2017  PHQ - 2 Score 0 0  0 0 0 0 0  PHQ- 9 Score - - 0 - 4 - -    Assessment: Patient will continue to benefit from health coach outreach for disease management and support.  THN CM Care Plan Problem One     Most Recent Value  THN Long Term Goal   In 60 days the patient will verbalize that his blood pressure have remianed stable in the 140/80's  THN Long Term Goal Start Date  07/19/18  Interventions for Problem One Long Term Goal  Reviewed blood pressure, low slt diet and medication adherence.     Plan: RN Health Coach will contact patient in the month of February and patient agrees to next outreach.    Lazaro Arms RN, BSN, [place] Direct Dial:  [phone]  Fax:  [phone]

## 2018-07-20 DIAGNOSIS — N2581 Secondary hyperparathyroidism of renal origin: Secondary | ICD-10-CM | POA: Diagnosis not present

## 2018-07-20 DIAGNOSIS — N186 End stage renal disease: Secondary | ICD-10-CM | POA: Diagnosis not present

## 2018-07-20 DIAGNOSIS — D509 Iron deficiency anemia, unspecified: Secondary | ICD-10-CM | POA: Diagnosis not present

## 2018-07-20 DIAGNOSIS — D631 Anemia in chronic kidney disease: Secondary | ICD-10-CM | POA: Diagnosis not present

## 2018-07-20 DIAGNOSIS — Z23 Encounter for immunization: Secondary | ICD-10-CM | POA: Diagnosis not present

## 2018-07-21 DIAGNOSIS — I120 Hypertensive chronic kidney disease with stage 5 chronic kidney disease or end stage renal disease: Secondary | ICD-10-CM | POA: Diagnosis not present

## 2018-07-21 DIAGNOSIS — Z992 Dependence on renal dialysis: Secondary | ICD-10-CM | POA: Diagnosis not present

## 2018-07-21 DIAGNOSIS — N186 End stage renal disease: Secondary | ICD-10-CM | POA: Diagnosis not present

## 2018-07-23 DIAGNOSIS — D509 Iron deficiency anemia, unspecified: Secondary | ICD-10-CM | POA: Diagnosis not present

## 2018-07-23 DIAGNOSIS — D631 Anemia in chronic kidney disease: Secondary | ICD-10-CM | POA: Diagnosis not present

## 2018-07-23 DIAGNOSIS — N186 End stage renal disease: Secondary | ICD-10-CM | POA: Diagnosis not present

## 2018-07-23 DIAGNOSIS — N2581 Secondary hyperparathyroidism of renal origin: Secondary | ICD-10-CM | POA: Diagnosis not present

## 2018-07-26 DIAGNOSIS — N2581 Secondary hyperparathyroidism of renal origin: Secondary | ICD-10-CM | POA: Diagnosis not present

## 2018-07-26 DIAGNOSIS — H43393 Other vitreous opacities, bilateral: Secondary | ICD-10-CM | POA: Diagnosis not present

## 2018-07-26 DIAGNOSIS — H40033 Anatomical narrow angle, bilateral: Secondary | ICD-10-CM | POA: Diagnosis not present

## 2018-07-26 DIAGNOSIS — N186 End stage renal disease: Secondary | ICD-10-CM | POA: Diagnosis not present

## 2018-07-26 DIAGNOSIS — D509 Iron deficiency anemia, unspecified: Secondary | ICD-10-CM | POA: Diagnosis not present

## 2018-07-26 DIAGNOSIS — D631 Anemia in chronic kidney disease: Secondary | ICD-10-CM | POA: Diagnosis not present

## 2018-07-27 ENCOUNTER — Other Ambulatory Visit (INDEPENDENT_AMBULATORY_CARE_PROVIDER_SITE_OTHER): Payer: Self-pay | Admitting: Physician Assistant

## 2018-07-27 DIAGNOSIS — I10 Essential (primary) hypertension: Secondary | ICD-10-CM

## 2018-07-27 NOTE — Telephone Encounter (Signed)
FWD to PCP. Tempestt S Roberts, CMA  

## 2018-07-28 ENCOUNTER — Other Ambulatory Visit (INDEPENDENT_AMBULATORY_CARE_PROVIDER_SITE_OTHER): Payer: Self-pay | Admitting: Physician Assistant

## 2018-07-28 DIAGNOSIS — I10 Essential (primary) hypertension: Secondary | ICD-10-CM

## 2018-07-28 DIAGNOSIS — D509 Iron deficiency anemia, unspecified: Secondary | ICD-10-CM | POA: Diagnosis not present

## 2018-07-28 DIAGNOSIS — N186 End stage renal disease: Secondary | ICD-10-CM | POA: Diagnosis not present

## 2018-07-28 DIAGNOSIS — N2581 Secondary hyperparathyroidism of renal origin: Secondary | ICD-10-CM | POA: Diagnosis not present

## 2018-07-28 DIAGNOSIS — D631 Anemia in chronic kidney disease: Secondary | ICD-10-CM | POA: Diagnosis not present

## 2018-07-31 DIAGNOSIS — N2581 Secondary hyperparathyroidism of renal origin: Secondary | ICD-10-CM | POA: Diagnosis not present

## 2018-07-31 DIAGNOSIS — N186 End stage renal disease: Secondary | ICD-10-CM | POA: Diagnosis not present

## 2018-07-31 DIAGNOSIS — D631 Anemia in chronic kidney disease: Secondary | ICD-10-CM | POA: Diagnosis not present

## 2018-07-31 DIAGNOSIS — D509 Iron deficiency anemia, unspecified: Secondary | ICD-10-CM | POA: Diagnosis not present

## 2018-08-02 DIAGNOSIS — N186 End stage renal disease: Secondary | ICD-10-CM | POA: Diagnosis not present

## 2018-08-02 DIAGNOSIS — D509 Iron deficiency anemia, unspecified: Secondary | ICD-10-CM | POA: Diagnosis not present

## 2018-08-02 DIAGNOSIS — D631 Anemia in chronic kidney disease: Secondary | ICD-10-CM | POA: Diagnosis not present

## 2018-08-02 DIAGNOSIS — N2581 Secondary hyperparathyroidism of renal origin: Secondary | ICD-10-CM | POA: Diagnosis not present

## 2018-08-04 DIAGNOSIS — D509 Iron deficiency anemia, unspecified: Secondary | ICD-10-CM | POA: Diagnosis not present

## 2018-08-04 DIAGNOSIS — N2581 Secondary hyperparathyroidism of renal origin: Secondary | ICD-10-CM | POA: Diagnosis not present

## 2018-08-04 DIAGNOSIS — N186 End stage renal disease: Secondary | ICD-10-CM | POA: Diagnosis not present

## 2018-08-04 DIAGNOSIS — D631 Anemia in chronic kidney disease: Secondary | ICD-10-CM | POA: Diagnosis not present

## 2018-08-06 DIAGNOSIS — N186 End stage renal disease: Secondary | ICD-10-CM | POA: Diagnosis not present

## 2018-08-06 DIAGNOSIS — N2581 Secondary hyperparathyroidism of renal origin: Secondary | ICD-10-CM | POA: Diagnosis not present

## 2018-08-06 DIAGNOSIS — D509 Iron deficiency anemia, unspecified: Secondary | ICD-10-CM | POA: Diagnosis not present

## 2018-08-06 DIAGNOSIS — D631 Anemia in chronic kidney disease: Secondary | ICD-10-CM | POA: Diagnosis not present

## 2018-08-09 DIAGNOSIS — N2581 Secondary hyperparathyroidism of renal origin: Secondary | ICD-10-CM | POA: Diagnosis not present

## 2018-08-09 DIAGNOSIS — N186 End stage renal disease: Secondary | ICD-10-CM | POA: Diagnosis not present

## 2018-08-09 DIAGNOSIS — D631 Anemia in chronic kidney disease: Secondary | ICD-10-CM | POA: Diagnosis not present

## 2018-08-09 DIAGNOSIS — D509 Iron deficiency anemia, unspecified: Secondary | ICD-10-CM | POA: Diagnosis not present

## 2018-08-11 DIAGNOSIS — N186 End stage renal disease: Secondary | ICD-10-CM | POA: Diagnosis not present

## 2018-08-11 DIAGNOSIS — N2581 Secondary hyperparathyroidism of renal origin: Secondary | ICD-10-CM | POA: Diagnosis not present

## 2018-08-11 DIAGNOSIS — D631 Anemia in chronic kidney disease: Secondary | ICD-10-CM | POA: Diagnosis not present

## 2018-08-11 DIAGNOSIS — D509 Iron deficiency anemia, unspecified: Secondary | ICD-10-CM | POA: Diagnosis not present

## 2018-08-13 DIAGNOSIS — D509 Iron deficiency anemia, unspecified: Secondary | ICD-10-CM | POA: Diagnosis not present

## 2018-08-13 DIAGNOSIS — N2581 Secondary hyperparathyroidism of renal origin: Secondary | ICD-10-CM | POA: Diagnosis not present

## 2018-08-13 DIAGNOSIS — N186 End stage renal disease: Secondary | ICD-10-CM | POA: Diagnosis not present

## 2018-08-13 DIAGNOSIS — D631 Anemia in chronic kidney disease: Secondary | ICD-10-CM | POA: Diagnosis not present

## 2018-08-16 DIAGNOSIS — D631 Anemia in chronic kidney disease: Secondary | ICD-10-CM | POA: Diagnosis not present

## 2018-08-16 DIAGNOSIS — D509 Iron deficiency anemia, unspecified: Secondary | ICD-10-CM | POA: Diagnosis not present

## 2018-08-16 DIAGNOSIS — N2581 Secondary hyperparathyroidism of renal origin: Secondary | ICD-10-CM | POA: Diagnosis not present

## 2018-08-16 DIAGNOSIS — N186 End stage renal disease: Secondary | ICD-10-CM | POA: Diagnosis not present

## 2018-08-17 MED FILL — JULUCA 50-25 MG TAB: 50-25 | 30 days supply | Qty: 30 | Fill #0

## 2018-08-17 MED FILL — ZIDOVUDINE 300 MG TABLET: 300 | 30 days supply | Qty: 30 | Fill #0

## 2018-08-18 DIAGNOSIS — D631 Anemia in chronic kidney disease: Secondary | ICD-10-CM | POA: Diagnosis not present

## 2018-08-18 DIAGNOSIS — D509 Iron deficiency anemia, unspecified: Secondary | ICD-10-CM | POA: Diagnosis not present

## 2018-08-18 DIAGNOSIS — N2581 Secondary hyperparathyroidism of renal origin: Secondary | ICD-10-CM | POA: Diagnosis not present

## 2018-08-18 DIAGNOSIS — N186 End stage renal disease: Secondary | ICD-10-CM | POA: Diagnosis not present

## 2018-08-20 DIAGNOSIS — N186 End stage renal disease: Secondary | ICD-10-CM | POA: Diagnosis not present

## 2018-08-20 DIAGNOSIS — D509 Iron deficiency anemia, unspecified: Secondary | ICD-10-CM | POA: Diagnosis not present

## 2018-08-20 DIAGNOSIS — N2581 Secondary hyperparathyroidism of renal origin: Secondary | ICD-10-CM | POA: Diagnosis not present

## 2018-08-20 DIAGNOSIS — D631 Anemia in chronic kidney disease: Secondary | ICD-10-CM | POA: Diagnosis not present

## 2018-08-21 DIAGNOSIS — I120 Hypertensive chronic kidney disease with stage 5 chronic kidney disease or end stage renal disease: Secondary | ICD-10-CM | POA: Diagnosis not present

## 2018-08-21 DIAGNOSIS — N186 End stage renal disease: Secondary | ICD-10-CM | POA: Diagnosis not present

## 2018-08-21 DIAGNOSIS — Z992 Dependence on renal dialysis: Secondary | ICD-10-CM | POA: Diagnosis not present

## 2018-08-23 DIAGNOSIS — D631 Anemia in chronic kidney disease: Secondary | ICD-10-CM | POA: Diagnosis not present

## 2018-08-23 DIAGNOSIS — D509 Iron deficiency anemia, unspecified: Secondary | ICD-10-CM | POA: Diagnosis not present

## 2018-08-23 DIAGNOSIS — N186 End stage renal disease: Secondary | ICD-10-CM | POA: Diagnosis not present

## 2018-08-23 DIAGNOSIS — N2581 Secondary hyperparathyroidism of renal origin: Secondary | ICD-10-CM | POA: Diagnosis not present

## 2018-08-25 DIAGNOSIS — D631 Anemia in chronic kidney disease: Secondary | ICD-10-CM | POA: Diagnosis not present

## 2018-08-25 DIAGNOSIS — N2581 Secondary hyperparathyroidism of renal origin: Secondary | ICD-10-CM | POA: Diagnosis not present

## 2018-08-25 DIAGNOSIS — D509 Iron deficiency anemia, unspecified: Secondary | ICD-10-CM | POA: Diagnosis not present

## 2018-08-25 DIAGNOSIS — N186 End stage renal disease: Secondary | ICD-10-CM | POA: Diagnosis not present

## 2018-08-27 DIAGNOSIS — N2581 Secondary hyperparathyroidism of renal origin: Secondary | ICD-10-CM | POA: Diagnosis not present

## 2018-08-27 DIAGNOSIS — N186 End stage renal disease: Secondary | ICD-10-CM | POA: Diagnosis not present

## 2018-08-27 DIAGNOSIS — D631 Anemia in chronic kidney disease: Secondary | ICD-10-CM | POA: Diagnosis not present

## 2018-08-27 DIAGNOSIS — D509 Iron deficiency anemia, unspecified: Secondary | ICD-10-CM | POA: Diagnosis not present

## 2018-08-30 ENCOUNTER — Ambulatory Visit (INDEPENDENT_AMBULATORY_CARE_PROVIDER_SITE_OTHER): Payer: Medicare Other | Admitting: Primary Care

## 2018-08-30 DIAGNOSIS — N186 End stage renal disease: Secondary | ICD-10-CM | POA: Diagnosis not present

## 2018-08-30 DIAGNOSIS — D509 Iron deficiency anemia, unspecified: Secondary | ICD-10-CM | POA: Diagnosis not present

## 2018-08-30 DIAGNOSIS — D631 Anemia in chronic kidney disease: Secondary | ICD-10-CM | POA: Diagnosis not present

## 2018-08-30 DIAGNOSIS — N2581 Secondary hyperparathyroidism of renal origin: Secondary | ICD-10-CM | POA: Diagnosis not present

## 2018-09-01 DIAGNOSIS — N186 End stage renal disease: Secondary | ICD-10-CM | POA: Diagnosis not present

## 2018-09-01 DIAGNOSIS — N2581 Secondary hyperparathyroidism of renal origin: Secondary | ICD-10-CM | POA: Diagnosis not present

## 2018-09-01 DIAGNOSIS — D509 Iron deficiency anemia, unspecified: Secondary | ICD-10-CM | POA: Diagnosis not present

## 2018-09-01 DIAGNOSIS — D631 Anemia in chronic kidney disease: Secondary | ICD-10-CM | POA: Diagnosis not present

## 2018-09-03 DIAGNOSIS — D631 Anemia in chronic kidney disease: Secondary | ICD-10-CM | POA: Diagnosis not present

## 2018-09-03 DIAGNOSIS — N186 End stage renal disease: Secondary | ICD-10-CM | POA: Diagnosis not present

## 2018-09-03 DIAGNOSIS — N2581 Secondary hyperparathyroidism of renal origin: Secondary | ICD-10-CM | POA: Diagnosis not present

## 2018-09-03 DIAGNOSIS — D509 Iron deficiency anemia, unspecified: Secondary | ICD-10-CM | POA: Diagnosis not present

## 2018-09-06 DIAGNOSIS — D509 Iron deficiency anemia, unspecified: Secondary | ICD-10-CM | POA: Diagnosis not present

## 2018-09-06 DIAGNOSIS — N2581 Secondary hyperparathyroidism of renal origin: Secondary | ICD-10-CM | POA: Diagnosis not present

## 2018-09-06 DIAGNOSIS — D631 Anemia in chronic kidney disease: Secondary | ICD-10-CM | POA: Diagnosis not present

## 2018-09-06 DIAGNOSIS — N186 End stage renal disease: Secondary | ICD-10-CM | POA: Diagnosis not present

## 2018-09-08 DIAGNOSIS — N2581 Secondary hyperparathyroidism of renal origin: Secondary | ICD-10-CM | POA: Diagnosis not present

## 2018-09-08 DIAGNOSIS — N186 End stage renal disease: Secondary | ICD-10-CM | POA: Diagnosis not present

## 2018-09-08 DIAGNOSIS — D631 Anemia in chronic kidney disease: Secondary | ICD-10-CM | POA: Diagnosis not present

## 2018-09-08 DIAGNOSIS — D509 Iron deficiency anemia, unspecified: Secondary | ICD-10-CM | POA: Diagnosis not present

## 2018-09-10 MED FILL — ZIDOVUDINE 300 MG TABLET: 300 | 30 days supply | Qty: 30 | Fill #1

## 2018-09-10 MED FILL — JULUCA 50-25 MG TAB: 50-25 | 30 days supply | Qty: 30 | Fill #1

## 2018-09-11 DIAGNOSIS — D509 Iron deficiency anemia, unspecified: Secondary | ICD-10-CM | POA: Diagnosis not present

## 2018-09-11 DIAGNOSIS — N186 End stage renal disease: Secondary | ICD-10-CM | POA: Diagnosis not present

## 2018-09-11 DIAGNOSIS — N2581 Secondary hyperparathyroidism of renal origin: Secondary | ICD-10-CM | POA: Diagnosis not present

## 2018-09-11 DIAGNOSIS — D631 Anemia in chronic kidney disease: Secondary | ICD-10-CM | POA: Diagnosis not present

## 2018-09-13 DIAGNOSIS — D509 Iron deficiency anemia, unspecified: Secondary | ICD-10-CM | POA: Diagnosis not present

## 2018-09-13 DIAGNOSIS — N2581 Secondary hyperparathyroidism of renal origin: Secondary | ICD-10-CM | POA: Diagnosis not present

## 2018-09-13 DIAGNOSIS — N186 End stage renal disease: Secondary | ICD-10-CM | POA: Diagnosis not present

## 2018-09-13 DIAGNOSIS — D631 Anemia in chronic kidney disease: Secondary | ICD-10-CM | POA: Diagnosis not present

## 2018-09-14 ENCOUNTER — Other Ambulatory Visit: Payer: Self-pay

## 2018-09-14 NOTE — Patient Outreach (Signed)
[place]) Care Management  09/14/2018  Darrell Johnson Mar 07, 1966 [id number]    1st attempt to  Outreach the patient for assessment. No answer. Unable to leave a voicemail due to no voicemail pick up.  Plan: RN Health Coach will send letter. [place] will make outreach attempt the patient within the month of March.  Lazaro Arms RN, BSN, [place] Direct Dial:  [phone]  Fax: [phone]

## 2018-09-15 DIAGNOSIS — N2581 Secondary hyperparathyroidism of renal origin: Secondary | ICD-10-CM | POA: Diagnosis not present

## 2018-09-15 DIAGNOSIS — D631 Anemia in chronic kidney disease: Secondary | ICD-10-CM | POA: Diagnosis not present

## 2018-09-15 DIAGNOSIS — N186 End stage renal disease: Secondary | ICD-10-CM | POA: Diagnosis not present

## 2018-09-15 DIAGNOSIS — D509 Iron deficiency anemia, unspecified: Secondary | ICD-10-CM | POA: Diagnosis not present

## 2018-09-17 ENCOUNTER — Ambulatory Visit: Payer: Self-pay

## 2018-09-17 DIAGNOSIS — N2581 Secondary hyperparathyroidism of renal origin: Secondary | ICD-10-CM | POA: Diagnosis not present

## 2018-09-17 DIAGNOSIS — D509 Iron deficiency anemia, unspecified: Secondary | ICD-10-CM | POA: Diagnosis not present

## 2018-09-17 DIAGNOSIS — D631 Anemia in chronic kidney disease: Secondary | ICD-10-CM | POA: Diagnosis not present

## 2018-09-17 DIAGNOSIS — N186 End stage renal disease: Secondary | ICD-10-CM | POA: Diagnosis not present

## 2018-09-19 DIAGNOSIS — N186 End stage renal disease: Secondary | ICD-10-CM | POA: Diagnosis not present

## 2018-09-19 DIAGNOSIS — I120 Hypertensive chronic kidney disease with stage 5 chronic kidney disease or end stage renal disease: Secondary | ICD-10-CM | POA: Diagnosis not present

## 2018-09-19 DIAGNOSIS — Z992 Dependence on renal dialysis: Secondary | ICD-10-CM | POA: Diagnosis not present

## 2018-09-20 DIAGNOSIS — N2581 Secondary hyperparathyroidism of renal origin: Secondary | ICD-10-CM | POA: Diagnosis not present

## 2018-09-20 DIAGNOSIS — D509 Iron deficiency anemia, unspecified: Secondary | ICD-10-CM | POA: Diagnosis not present

## 2018-09-20 DIAGNOSIS — N186 End stage renal disease: Secondary | ICD-10-CM | POA: Diagnosis not present

## 2018-09-20 DIAGNOSIS — D631 Anemia in chronic kidney disease: Secondary | ICD-10-CM | POA: Diagnosis not present

## 2018-09-21 ENCOUNTER — Ambulatory Visit (INDEPENDENT_AMBULATORY_CARE_PROVIDER_SITE_OTHER): Payer: Medicare Other | Admitting: Primary Care

## 2018-09-21 ENCOUNTER — Other Ambulatory Visit: Payer: Self-pay

## 2018-09-21 ENCOUNTER — Encounter (INDEPENDENT_AMBULATORY_CARE_PROVIDER_SITE_OTHER): Payer: Self-pay | Admitting: Primary Care

## 2018-09-21 VITALS — BP 122/69 | HR 90 | Temp 98.6°F | Ht 69.0 in | Wt 263.4 lb

## 2018-09-21 DIAGNOSIS — F329 Major depressive disorder, single episode, unspecified: Secondary | ICD-10-CM

## 2018-09-21 DIAGNOSIS — R0602 Shortness of breath: Secondary | ICD-10-CM

## 2018-09-21 DIAGNOSIS — J302 Other seasonal allergic rhinitis: Secondary | ICD-10-CM | POA: Diagnosis not present

## 2018-09-21 DIAGNOSIS — B20 Human immunodeficiency virus [HIV] disease: Secondary | ICD-10-CM | POA: Diagnosis not present

## 2018-09-21 DIAGNOSIS — E7849 Other hyperlipidemia: Secondary | ICD-10-CM

## 2018-09-21 DIAGNOSIS — I10 Essential (primary) hypertension: Secondary | ICD-10-CM

## 2018-09-21 DIAGNOSIS — T7840XS Allergy, unspecified, sequela: Secondary | ICD-10-CM | POA: Diagnosis not present

## 2018-09-21 DIAGNOSIS — F17210 Nicotine dependence, cigarettes, uncomplicated: Secondary | ICD-10-CM | POA: Diagnosis not present

## 2018-09-21 DIAGNOSIS — Z760 Encounter for issue of repeat prescription: Secondary | ICD-10-CM | POA: Diagnosis not present

## 2018-09-21 DIAGNOSIS — Z720 Tobacco use: Secondary | ICD-10-CM

## 2018-09-21 DIAGNOSIS — F32A Depression, unspecified: Secondary | ICD-10-CM

## 2018-09-21 MED ORDER — BUDESONIDE-FORMOTEROL FUMARATE 160-4.5 MCG/ACT IN AERO: 2.0000 | INHALATION_SPRAY | Freq: Two times a day (BID) | RESPIRATORY_TRACT | 5 refills | Status: AC

## 2018-09-21 MED ORDER — ALBUTEROL SULFATE HFA 108 (90 BASE) MCG/ACT IN AERS: 2.0000 | INHALATION_SPRAY | RESPIRATORY_TRACT | 2 refills | Status: DC | PRN

## 2018-09-21 MED ORDER — FUROSEMIDE 40 MG PO TABS: 40.0000 mg | ORAL_TABLET | Freq: Every day | ORAL | 3 refills | Status: DC | PRN

## 2018-09-21 MED ORDER — CARVEDILOL 25 MG PO TABS: 25.0000 mg | ORAL_TABLET | Freq: Two times a day (BID) | ORAL | 3 refills | Status: AC

## 2018-09-21 MED ORDER — NICOTINE 21 MG/24HR TD PT24: 21.0000 mg | MEDICATED_PATCH | Freq: Every day | TRANSDERMAL | 0 refills | Status: DC

## 2018-09-21 MED ORDER — AMLODIPINE BESYLATE 10 MG PO TABS: 10.0000 mg | ORAL_TABLET | Freq: Every day | ORAL | 3 refills | Status: DC

## 2018-09-21 MED ORDER — NICOTINE 14 MG/24HR TD PT24: 14.0000 mg | MEDICATED_PATCH | Freq: Every day | TRANSDERMAL | 0 refills | Status: DC

## 2018-09-21 MED ORDER — LORATADINE 10 MG PO TABS: 10.0000 mg | ORAL_TABLET | Freq: Every day | ORAL | 11 refills | Status: DC

## 2018-09-21 MED ORDER — DOXAZOSIN MESYLATE 8 MG PO TABS: 8.0000 mg | ORAL_TABLET | Freq: Every day | ORAL | 3 refills | Status: DC

## 2018-09-21 MED ORDER — NICOTINE 7 MG/24HR TD PT24: 7.0000 mg | MEDICATED_PATCH | Freq: Every day | TRANSDERMAL | 0 refills | Status: DC

## 2018-09-21 MED ORDER — IPRATROPIUM-ALBUTEROL 0.5-2.5 (3) MG/3ML IN SOLN: 3.0000 mL | Freq: Four times a day (QID) | RESPIRATORY_TRACT | 1 refills | Status: AC | PRN

## 2018-09-21 MED ORDER — PRAVASTATIN SODIUM 40 MG PO TABS: 40.0000 mg | ORAL_TABLET | Freq: Every day | ORAL | 3 refills | Status: DC

## 2018-09-21 NOTE — Progress Notes (Signed)
Established Patient Office Visit  Subjective:  Patient ID: Darrell Johnson, male    DOB: 1965/10/23  Age: 53 y.o. MRN: [id number]  CC:  Chief Complaint  Patient presents with  . Hypertension    HPI [name] presents for follow up on hypertension. Previously needed a refill on lasix but nephrology refilled It. He has a PMH kidney failure on dialysis M/W/F, HIV, HLD, tobacco abuse, chronic sphenoidal sinusitisand on kidney transplant donor list.    Past Medical History:  Diagnosis Date  . Anemia   . Anxiety   . Arthritis    "knees" (04/13/2017)  . Asthma   . CHF (congestive heart failure) ([place])   . ESRD (end stage renal disease) on dialysis [facility])    "MWF; [facility]" (04/13/2017)  . Gout, unspecified 08/13/2009   Qualifier: Diagnosis of  By: Redmond Pulling  MD, Mateo Flow    . H1N1 influenza    March 2016  . History of blood transfusion 02/2017   "related to OR"  . HIV infection ([place]) 1980's   on ART therapy since, followed by ID clinic, complicated  by neuropathy  . Hyperlipidemia    hypertrygliceridemia determined ti be secondary to ART therpay  . HYPERTENSION 05/08/2006  . Pneumonia    "once; years ago" (04/13/2017)  . Rib fractures 01/2009  . Seizures ([place])    last seizure was in the 1990s, pt has family history of seizures; "probably related to alcohol" (04/13/2017)  . 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 Syphilis 1997   history of syphilis 1997  . Tobacco abuse     Past Surgical History:  Procedure Laterality Date  . AV FISTULA PLACEMENT Right 12/02/2013   Procedure: ARTERIOVENOUS (AV) FISTULA CREATION;  Surgeon: Rosetta Posner, MD;  Location: [place];  Service: Vascular;  Laterality: Right;  . INCISION AND DRAINAGE ABSCESS Right 03/09/2017   Procedure: INCISION AND DRAINAGE Right Scrotal Abscess;  Surgeon: Franchot Gallo, MD;  Location: [place];  Service: Urology;  Laterality: Right;  . THROMBECTOMY Right ~ 2016   "AV fistula clotted off"    Family History  Problem Relation Age of Onset  . Cancer Mother   . Hypertension Mother   . COPD Father   . Hypertension Father   . Diabetes Sister   . Hypertension Sister   . Diabetes Brother   . Hypertension Brother   . Stroke Neg Hx     Social History   Socioeconomic History  . Marital status: Married    Spouse name: Not on file  . Number of children: Not on file  . Years of education: Not on file  . Highest education level: Not on file  Occupational History  . Occupation: details cars  Social Needs  . Financial resource strain: Not on file  . Food insecurity:    Worry: Not on file    Inability: Not on file  . Transportation needs:    Medical: Not on file    Non-medical: Not on file  Tobacco Use  . Smoking status: Current Some Day Smoker    Packs/day: 0.10    Years: 25.00    Pack years: 2.50    Types: Cigarettes    Last attempt to quit: 10/19/2017    Years since quitting: 0.9  . Smokeless tobacco: Never Used  . Tobacco comment: "quitting"  Substance and Sexual Activity  . Alcohol use: No    Alcohol/week: 0.0 standard drinks    Comment: 04/13/2017 "quit ~ 2014"  . Drug use: No  . Sexual activity: Never    Partners: Female    Comment: given condoms  Lifestyle  . Physical activity:    Days per week: Not on file    Minutes per session: Not on file  . Stress: Not on file  Relationships  . Social connections:    Talks on phone: Not on file    Gets together: Not on file    Attends religious service: Not on file    Active member of club or organization: Not on file    Attends meetings of clubs or organizations: Not on file    Relationship status: Not on file  . Intimate partner violence:    Fear of current or ex partner: Not on file    Emotionally abused: Not on file    Physically abused: Not on file    Forced sexual activity:  Not on file  Other Topics Concern  . Not on file  Social History Narrative   ** Merged History Encounter **        Outpatient Medications Prior to Visit  Medication Sig Dispense Refill  . amLODipine (NORVASC) 10 MG tablet Take 1 tablet (10 mg total) by mouth daily. 90 tablet 3  . budesonide-formoterol (SYMBICORT) 160-4.5 MCG/ACT inhaler Inhale 2 puffs into the lungs 2 (two) times daily. 1 Inhaler 5  . carvedilol (COREG) 25 MG tablet Take 1 tablet (25 mg total) by mouth 2 (two) times daily with a meal. 180 tablet 3  . Dolutegravir-Rilpivirine (JULUCA) 50-25 MG TABS Take 1 tablet by mouth daily with supper. 30 tablet 5  . doxazosin (CARDURA) 8 MG tablet Take 1 tablet (8 mg total) by mouth at bedtime. 90 tablet 3  . furosemide (LASIX) 40 MG tablet Take 40 mg by mouth daily as needed for fluid.    Marland Kitchen ipratropium-albuterol (DUONEB) 0.5-2.5 (3) MG/3ML SOLN Take 3 mLs by nebulization every 6 (six) hours as needed. 360 mL 1  . zidovudine (RETROVIR) 300 MG tablet Take 1 tablet (300 mg total) by mouth daily. 30 tablet 5  . albuterol (PROVENTIL HFA;VENTOLIN HFA) 108 (90 Base) MCG/ACT inhaler Inhale 2 puffs into the lungs every 4 (four) hours as needed for wheezing or shortness of breath. 1 Inhaler 5  . pravastatin (PRAVACHOL) 40 MG tablet Take 40 mg by mouth at bedtime.    . ferric citrate (AURYXIA) 1 GM 210 MG(Fe) tablet Take 2 tablets (420 mg total) by mouth 2 (two) times daily with a meal. 120 tablet 0   No facility-administered medications prior to visit.     No Known Allergies  ROS Review of Systems  Constitutional: Negative.   HENT: Positive for dental problem.   Eyes: Positive for visual disturbance.  Respiratory: Negative.   Gastrointestinal: Negative.   Endocrine: Negative.   Genitourinary: Negative.   Skin: Negative.   Allergic/Immunologic: Negative.   Neurological: Negative.   Hematological: Negative.   Psychiatric/Behavioral: Negative.       Objective:    Physical Exam   Constitutional: He is oriented to person, place, and time. He appears well-developed and well-nourished.  HENT:  Head: Normocephalic.  Eyes: Pupils are equal, round, and reactive to light. EOM are normal.  Neck: Normal range of motion.  Cardiovascular: Normal rate and regular rhythm.  Pulmonary/Chest: Effort normal and breath sounds normal.  Abdominal: Soft. Bowel  sounds are normal. He exhibits distension.  Musculoskeletal: Normal range of motion.  Neurological: He is alert and oriented to person, place, and time.  Skin: Skin is warm and dry.  Psychiatric: He has a normal mood and affect.    BP 122/69 (BP Location: Left Arm, Patient Position: Sitting, Cuff Size: Large)   Pulse 90   Temp 98.6 F (37 C) (Oral)   Ht 5\' 9"  (1.753 m)   Wt 263 lb 6.4 oz (119.5 kg)   SpO2 93%   BMI 38.90 kg/m  Wt Readings from Last 3 Encounters:  09/21/18 263 lb 6.4 oz (119.5 kg)  07/09/18 260 lb 6.4 oz (118.1 kg)  07/01/18 256 lb 9.6 oz (116.4 kg)     There are no preventive care reminders to display for this patient.  There are no preventive care reminders to display for this patient.  Lab Results  Component Value Date   TSH 1.214 07/06/2013   Lab Results  Component Value Date   WBC 5.5 10/14/2017   HGB 12.1 (L) 10/14/2017   HCT 37.8 (L) 10/14/2017   MCV 100.8 (H) 10/14/2017   PLT 140 (L) 10/14/2017   Lab Results  Component Value Date   NA 130 (L) 10/14/2017   K 4.8 10/14/2017   CO2 23 10/14/2017   GLUCOSE 80 10/14/2017   BUN 58 (H) 10/14/2017   CREATININE 12.56 (H) 10/14/2017   BILITOT 0.7 10/13/2017   ALKPHOS 101 10/13/2017   AST 24 10/13/2017   ALT 13 (L) 10/13/2017   PROT 8.2 (H) 10/13/2017   ALBUMIN 4.1 10/13/2017   CALCIUM 8.0 (L) 10/14/2017   ANIONGAP 17 (H) 10/14/2017   Lab Results  Component Value Date   CHOL 121 (L) 09/24/2015   Lab Results  Component Value Date   HDL 40 09/24/2015   Lab Results  Component Value Date   LDLCALC 61 09/24/2015   Lab  Results  Component Value Date   TRIG 99 09/24/2015   Lab Results  Component Value Date   CHOLHDL 3.0 09/24/2015   No results found for: HGBA1C    1. Hypertension, unspecified type wnl  2. Allergic state, sequela Sent in allergy   3. Depression, unspecified depression type States resolved feeling   4. Other hyperlipidemia Refilled statin ck lipid panel  5. HIV disease ([place]) Followed by [name] April 2019. undectectable  6. Essential hypertension wnl cont Bp regiment wnl  7. Tobacco abuse Down to 1/2 cigarette a day removed from transplant list. Still has a desire to smoke but is willing to try something for smoking cessation  8. Seasonal allergies Start claritin 10 mg QD  9. Medication refill All requested refills are completed   Follow-up: Return in about 3 months (around 12/22/2018) for HTN, hyperlipidemia and chronic conditions.    Kerin Perna, NP

## 2018-09-22 DIAGNOSIS — N2581 Secondary hyperparathyroidism of renal origin: Secondary | ICD-10-CM | POA: Diagnosis not present

## 2018-09-22 DIAGNOSIS — N186 End stage renal disease: Secondary | ICD-10-CM | POA: Diagnosis not present

## 2018-09-22 DIAGNOSIS — D509 Iron deficiency anemia, unspecified: Secondary | ICD-10-CM | POA: Diagnosis not present

## 2018-09-22 DIAGNOSIS — D631 Anemia in chronic kidney disease: Secondary | ICD-10-CM | POA: Diagnosis not present

## 2018-09-24 DIAGNOSIS — D509 Iron deficiency anemia, unspecified: Secondary | ICD-10-CM | POA: Diagnosis not present

## 2018-09-24 DIAGNOSIS — N186 End stage renal disease: Secondary | ICD-10-CM | POA: Diagnosis not present

## 2018-09-24 DIAGNOSIS — N2581 Secondary hyperparathyroidism of renal origin: Secondary | ICD-10-CM | POA: Diagnosis not present

## 2018-09-24 DIAGNOSIS — D631 Anemia in chronic kidney disease: Secondary | ICD-10-CM | POA: Diagnosis not present

## 2018-09-27 DIAGNOSIS — N186 End stage renal disease: Secondary | ICD-10-CM | POA: Diagnosis not present

## 2018-09-27 DIAGNOSIS — D509 Iron deficiency anemia, unspecified: Secondary | ICD-10-CM | POA: Diagnosis not present

## 2018-09-27 DIAGNOSIS — D631 Anemia in chronic kidney disease: Secondary | ICD-10-CM | POA: Diagnosis not present

## 2018-09-27 DIAGNOSIS — N2581 Secondary hyperparathyroidism of renal origin: Secondary | ICD-10-CM | POA: Diagnosis not present

## 2018-09-28 DIAGNOSIS — D631 Anemia in chronic kidney disease: Secondary | ICD-10-CM | POA: Diagnosis not present

## 2018-09-28 DIAGNOSIS — D509 Iron deficiency anemia, unspecified: Secondary | ICD-10-CM | POA: Diagnosis not present

## 2018-09-28 DIAGNOSIS — N2581 Secondary hyperparathyroidism of renal origin: Secondary | ICD-10-CM | POA: Diagnosis not present

## 2018-09-28 DIAGNOSIS — N186 End stage renal disease: Secondary | ICD-10-CM | POA: Diagnosis not present

## 2018-10-01 DIAGNOSIS — N186 End stage renal disease: Secondary | ICD-10-CM | POA: Diagnosis not present

## 2018-10-01 DIAGNOSIS — D509 Iron deficiency anemia, unspecified: Secondary | ICD-10-CM | POA: Diagnosis not present

## 2018-10-01 DIAGNOSIS — N2581 Secondary hyperparathyroidism of renal origin: Secondary | ICD-10-CM | POA: Diagnosis not present

## 2018-10-01 DIAGNOSIS — D631 Anemia in chronic kidney disease: Secondary | ICD-10-CM | POA: Diagnosis not present

## 2018-10-04 DIAGNOSIS — D509 Iron deficiency anemia, unspecified: Secondary | ICD-10-CM | POA: Diagnosis not present

## 2018-10-04 DIAGNOSIS — N2581 Secondary hyperparathyroidism of renal origin: Secondary | ICD-10-CM | POA: Diagnosis not present

## 2018-10-04 DIAGNOSIS — N186 End stage renal disease: Secondary | ICD-10-CM | POA: Diagnosis not present

## 2018-10-04 DIAGNOSIS — D631 Anemia in chronic kidney disease: Secondary | ICD-10-CM | POA: Diagnosis not present

## 2018-10-06 DIAGNOSIS — D631 Anemia in chronic kidney disease: Secondary | ICD-10-CM | POA: Diagnosis not present

## 2018-10-06 DIAGNOSIS — D509 Iron deficiency anemia, unspecified: Secondary | ICD-10-CM | POA: Diagnosis not present

## 2018-10-06 DIAGNOSIS — N2581 Secondary hyperparathyroidism of renal origin: Secondary | ICD-10-CM | POA: Diagnosis not present

## 2018-10-06 DIAGNOSIS — N186 End stage renal disease: Secondary | ICD-10-CM | POA: Diagnosis not present

## 2018-10-07 MED FILL — JULUCA 50-25 MG TAB: 50-25 | 30 days supply | Qty: 30 | Fill #2

## 2018-10-07 MED FILL — ZIDOVUDINE 300 MG TABLET: 300 | 30 days supply | Qty: 30 | Fill #2

## 2018-10-08 DIAGNOSIS — D631 Anemia in chronic kidney disease: Secondary | ICD-10-CM | POA: Diagnosis not present

## 2018-10-08 DIAGNOSIS — N2581 Secondary hyperparathyroidism of renal origin: Secondary | ICD-10-CM | POA: Diagnosis not present

## 2018-10-08 DIAGNOSIS — D509 Iron deficiency anemia, unspecified: Secondary | ICD-10-CM | POA: Diagnosis not present

## 2018-10-08 DIAGNOSIS — N186 End stage renal disease: Secondary | ICD-10-CM | POA: Diagnosis not present

## 2018-10-11 DIAGNOSIS — D509 Iron deficiency anemia, unspecified: Secondary | ICD-10-CM | POA: Diagnosis not present

## 2018-10-11 DIAGNOSIS — D631 Anemia in chronic kidney disease: Secondary | ICD-10-CM | POA: Diagnosis not present

## 2018-10-11 DIAGNOSIS — N2581 Secondary hyperparathyroidism of renal origin: Secondary | ICD-10-CM | POA: Diagnosis not present

## 2018-10-11 DIAGNOSIS — N186 End stage renal disease: Secondary | ICD-10-CM | POA: Diagnosis not present

## 2018-10-13 DIAGNOSIS — D631 Anemia in chronic kidney disease: Secondary | ICD-10-CM | POA: Diagnosis not present

## 2018-10-13 DIAGNOSIS — N2581 Secondary hyperparathyroidism of renal origin: Secondary | ICD-10-CM | POA: Diagnosis not present

## 2018-10-13 DIAGNOSIS — D509 Iron deficiency anemia, unspecified: Secondary | ICD-10-CM | POA: Diagnosis not present

## 2018-10-13 DIAGNOSIS — N186 End stage renal disease: Secondary | ICD-10-CM | POA: Diagnosis not present

## 2018-10-14 ENCOUNTER — Other Ambulatory Visit: Payer: Self-pay

## 2018-10-14 NOTE — Patient Outreach (Signed)
[facility]) Care Management  10/14/2018   Darrell Johnson 1965/07/23 [id number]  Subjective: Outreach to the patient.  HIPAA verified.  The patient states that he has been doing well.  He states when he goes to dialysis on Monday Wednesday and Fridays he has his blood pressure and temperature checked when he arrives.  He does not check his BP at home. When the patient went for a doctors visit on 3/2 his BP was 122/69. He states that his BP has been staying low.  He denies any pain or falls. He is taking his medications as prescribed.  He states that he just recently went to the grocery store for food and he wears a mask when he goes out.  His medications are filled.  Most of his medications come by mail but he does have to go through the drive to pick up others.  He states that he does have transportation to pick those up.  The patient's next follow up appointment at his PCP is in June.    Current Medications:  Current Outpatient Medications  Medication Sig Dispense Refill  . albuterol (PROVENTIL HFA;VENTOLIN HFA) 108 (90 Base) MCG/ACT inhaler Inhale 2 puffs into the lungs every 4 (four) hours as needed for up to 30 days for wheezing or shortness of breath. 1 Inhaler 2  . amLODipine (NORVASC) 10 MG tablet Take 1 tablet (10 mg total) by mouth daily. 90 tablet 3  . budesonide-formoterol (SYMBICORT) 160-4.5 MCG/ACT inhaler Inhale 2 puffs into the lungs 2 (two) times daily. 1 Inhaler 5  . carvedilol (COREG) 25 MG tablet Take 1 tablet (25 mg total) by mouth 2 (two) times daily with a meal. 180 tablet 3  . Dolutegravir-Rilpivirine (JULUCA) 50-25 MG TABS Take 1 tablet by mouth daily with supper. 30 tablet 5  . doxazosin (CARDURA) 8 MG tablet Take 1 tablet (8 mg total) by mouth at bedtime. 90 tablet 3  . furosemide (LASIX) 40 MG tablet Take 1 tablet (40 mg total) by mouth daily as needed for fluid. 30 tablet 3  . ipratropium-albuterol (DUONEB) 0.5-2.5 (3) MG/3ML SOLN Take 3 mLs by  nebulization every 6 (six) hours as needed. 360 mL 1  . loratadine (CLARITIN) 10 MG tablet Take 1 tablet (10 mg total) by mouth daily. 30 tablet 11  . nicotine (NICODERM CQ) 14 mg/24hr patch Place 1 patch (14 mg total) onto the skin daily. 28 patch 0  . nicotine (NICODERM CQ) 21 mg/24hr patch Place 1 patch (21 mg total) onto the skin daily. 28 patch 0  . nicotine (NICODERM CQ) 7 mg/24hr patch Place 1 patch (7 mg total) onto the skin daily. 28 patch 0  . pravastatin (PRAVACHOL) 40 MG tablet Take 1 tablet (40 mg total) by mouth at bedtime. 30 tablet 3  . zidovudine (RETROVIR) 300 MG tablet Take 1 tablet (300 mg total) by mouth daily. 30 tablet 5   No current facility-administered medications for this visit.     Functional Status:  No flowsheet data found.  Fall/Depression Screening: Fall Risk  09/21/2018 07/19/2018 07/09/2018  Falls in the past year? 0 0 0  Risk for fall due to : - - -   PHQ 2/9 Scores 09/21/2018 07/09/2018 07/01/2018 10/22/2017 10/19/2017 09/10/2017 08/11/2017  PHQ - 2 Score 0 0 0 0 0 0 0  PHQ- 9 Score - - - 0 - 4 -    Assessment: Patient will continue to benefit from health coach outreach for disease management and support. THN  CM Care Plan Problem One     Most Recent Value  THN Long Term Goal   In 90 days the patient will verbalize that his blood pressure have remianed stable in the 122/70s  THN Long Term Goal Start Date  10/14/18  Interventions for Problem One Long Term Goal  Discussed with the patient about BP readings, low salt diet and encouraged medications adherence. Continue to wear a mask when going out of the home ,social distancing,washing hands and avoiding being around sick people.     Plan: RN Health Coach will contact patient in the month of June and patient agrees to next outreach.   Lazaro Arms RN, BSN, [place] Direct Dial:  [phone]  Fax: [phone]

## 2018-10-15 DIAGNOSIS — N2581 Secondary hyperparathyroidism of renal origin: Secondary | ICD-10-CM | POA: Diagnosis not present

## 2018-10-15 DIAGNOSIS — N186 End stage renal disease: Secondary | ICD-10-CM | POA: Diagnosis not present

## 2018-10-15 DIAGNOSIS — D509 Iron deficiency anemia, unspecified: Secondary | ICD-10-CM | POA: Diagnosis not present

## 2018-10-15 DIAGNOSIS — D631 Anemia in chronic kidney disease: Secondary | ICD-10-CM | POA: Diagnosis not present

## 2018-10-18 DIAGNOSIS — N2581 Secondary hyperparathyroidism of renal origin: Secondary | ICD-10-CM | POA: Diagnosis not present

## 2018-10-18 DIAGNOSIS — D509 Iron deficiency anemia, unspecified: Secondary | ICD-10-CM | POA: Diagnosis not present

## 2018-10-18 DIAGNOSIS — N186 End stage renal disease: Secondary | ICD-10-CM | POA: Diagnosis not present

## 2018-10-18 DIAGNOSIS — D631 Anemia in chronic kidney disease: Secondary | ICD-10-CM | POA: Diagnosis not present

## 2018-10-19 DIAGNOSIS — F17209 Nicotine dependence, unspecified, with unspecified nicotine-induced disorders: Secondary | ICD-10-CM | POA: Diagnosis not present

## 2018-10-20 DIAGNOSIS — Z992 Dependence on renal dialysis: Secondary | ICD-10-CM | POA: Diagnosis not present

## 2018-10-20 DIAGNOSIS — N2581 Secondary hyperparathyroidism of renal origin: Secondary | ICD-10-CM | POA: Diagnosis not present

## 2018-10-20 DIAGNOSIS — I120 Hypertensive chronic kidney disease with stage 5 chronic kidney disease or end stage renal disease: Secondary | ICD-10-CM | POA: Diagnosis not present

## 2018-10-20 DIAGNOSIS — N186 End stage renal disease: Secondary | ICD-10-CM | POA: Diagnosis not present

## 2018-10-20 DIAGNOSIS — D509 Iron deficiency anemia, unspecified: Secondary | ICD-10-CM | POA: Diagnosis not present

## 2018-10-20 DIAGNOSIS — D631 Anemia in chronic kidney disease: Secondary | ICD-10-CM | POA: Diagnosis not present

## 2018-10-22 DIAGNOSIS — D509 Iron deficiency anemia, unspecified: Secondary | ICD-10-CM | POA: Diagnosis not present

## 2018-10-22 DIAGNOSIS — N2581 Secondary hyperparathyroidism of renal origin: Secondary | ICD-10-CM | POA: Diagnosis not present

## 2018-10-22 DIAGNOSIS — D631 Anemia in chronic kidney disease: Secondary | ICD-10-CM | POA: Diagnosis not present

## 2018-10-22 DIAGNOSIS — N186 End stage renal disease: Secondary | ICD-10-CM | POA: Diagnosis not present

## 2018-10-25 DIAGNOSIS — N186 End stage renal disease: Secondary | ICD-10-CM | POA: Diagnosis not present

## 2018-10-25 DIAGNOSIS — D509 Iron deficiency anemia, unspecified: Secondary | ICD-10-CM | POA: Diagnosis not present

## 2018-10-25 DIAGNOSIS — N2581 Secondary hyperparathyroidism of renal origin: Secondary | ICD-10-CM | POA: Diagnosis not present

## 2018-10-25 DIAGNOSIS — D631 Anemia in chronic kidney disease: Secondary | ICD-10-CM | POA: Diagnosis not present

## 2018-10-27 DIAGNOSIS — N186 End stage renal disease: Secondary | ICD-10-CM | POA: Diagnosis not present

## 2018-10-27 DIAGNOSIS — N2581 Secondary hyperparathyroidism of renal origin: Secondary | ICD-10-CM | POA: Diagnosis not present

## 2018-10-27 DIAGNOSIS — D631 Anemia in chronic kidney disease: Secondary | ICD-10-CM | POA: Diagnosis not present

## 2018-10-27 DIAGNOSIS — D509 Iron deficiency anemia, unspecified: Secondary | ICD-10-CM | POA: Diagnosis not present

## 2018-10-29 DIAGNOSIS — D509 Iron deficiency anemia, unspecified: Secondary | ICD-10-CM | POA: Diagnosis not present

## 2018-10-29 DIAGNOSIS — D631 Anemia in chronic kidney disease: Secondary | ICD-10-CM | POA: Diagnosis not present

## 2018-10-29 DIAGNOSIS — N2581 Secondary hyperparathyroidism of renal origin: Secondary | ICD-10-CM | POA: Diagnosis not present

## 2018-10-29 DIAGNOSIS — N186 End stage renal disease: Secondary | ICD-10-CM | POA: Diagnosis not present

## 2018-11-01 DIAGNOSIS — D631 Anemia in chronic kidney disease: Secondary | ICD-10-CM | POA: Diagnosis not present

## 2018-11-01 DIAGNOSIS — N2581 Secondary hyperparathyroidism of renal origin: Secondary | ICD-10-CM | POA: Diagnosis not present

## 2018-11-01 DIAGNOSIS — N186 End stage renal disease: Secondary | ICD-10-CM | POA: Diagnosis not present

## 2018-11-01 DIAGNOSIS — D509 Iron deficiency anemia, unspecified: Secondary | ICD-10-CM | POA: Diagnosis not present

## 2018-11-02 DIAGNOSIS — F17209 Nicotine dependence, unspecified, with unspecified nicotine-induced disorders: Secondary | ICD-10-CM | POA: Diagnosis not present

## 2018-11-02 MED FILL — JULUCA 50-25 MG TAB: 50-25 | 30 days supply | Qty: 30 | Fill #3

## 2018-11-02 MED FILL — ZIDOVUDINE 300 MG TABLET: 300 | 30 days supply | Qty: 30 | Fill #3

## 2018-11-03 DIAGNOSIS — D509 Iron deficiency anemia, unspecified: Secondary | ICD-10-CM | POA: Diagnosis not present

## 2018-11-03 DIAGNOSIS — N2581 Secondary hyperparathyroidism of renal origin: Secondary | ICD-10-CM | POA: Diagnosis not present

## 2018-11-03 DIAGNOSIS — N186 End stage renal disease: Secondary | ICD-10-CM | POA: Diagnosis not present

## 2018-11-03 DIAGNOSIS — D631 Anemia in chronic kidney disease: Secondary | ICD-10-CM | POA: Diagnosis not present

## 2018-11-05 DIAGNOSIS — D631 Anemia in chronic kidney disease: Secondary | ICD-10-CM | POA: Diagnosis not present

## 2018-11-05 DIAGNOSIS — D509 Iron deficiency anemia, unspecified: Secondary | ICD-10-CM | POA: Diagnosis not present

## 2018-11-05 DIAGNOSIS — N186 End stage renal disease: Secondary | ICD-10-CM | POA: Diagnosis not present

## 2018-11-05 DIAGNOSIS — N2581 Secondary hyperparathyroidism of renal origin: Secondary | ICD-10-CM | POA: Diagnosis not present

## 2018-11-08 DIAGNOSIS — N2581 Secondary hyperparathyroidism of renal origin: Secondary | ICD-10-CM | POA: Diagnosis not present

## 2018-11-08 DIAGNOSIS — N186 End stage renal disease: Secondary | ICD-10-CM | POA: Diagnosis not present

## 2018-11-08 DIAGNOSIS — D509 Iron deficiency anemia, unspecified: Secondary | ICD-10-CM | POA: Diagnosis not present

## 2018-11-08 DIAGNOSIS — D631 Anemia in chronic kidney disease: Secondary | ICD-10-CM | POA: Diagnosis not present

## 2018-11-10 DIAGNOSIS — D631 Anemia in chronic kidney disease: Secondary | ICD-10-CM | POA: Diagnosis not present

## 2018-11-10 DIAGNOSIS — D509 Iron deficiency anemia, unspecified: Secondary | ICD-10-CM | POA: Diagnosis not present

## 2018-11-10 DIAGNOSIS — N2581 Secondary hyperparathyroidism of renal origin: Secondary | ICD-10-CM | POA: Diagnosis not present

## 2018-11-10 DIAGNOSIS — N186 End stage renal disease: Secondary | ICD-10-CM | POA: Diagnosis not present

## 2018-11-12 DIAGNOSIS — D509 Iron deficiency anemia, unspecified: Secondary | ICD-10-CM | POA: Diagnosis not present

## 2018-11-12 DIAGNOSIS — N2581 Secondary hyperparathyroidism of renal origin: Secondary | ICD-10-CM | POA: Diagnosis not present

## 2018-11-12 DIAGNOSIS — N186 End stage renal disease: Secondary | ICD-10-CM | POA: Diagnosis not present

## 2018-11-12 DIAGNOSIS — D631 Anemia in chronic kidney disease: Secondary | ICD-10-CM | POA: Diagnosis not present

## 2018-11-15 DIAGNOSIS — N2581 Secondary hyperparathyroidism of renal origin: Secondary | ICD-10-CM | POA: Diagnosis not present

## 2018-11-15 DIAGNOSIS — D631 Anemia in chronic kidney disease: Secondary | ICD-10-CM | POA: Diagnosis not present

## 2018-11-15 DIAGNOSIS — D509 Iron deficiency anemia, unspecified: Secondary | ICD-10-CM | POA: Diagnosis not present

## 2018-11-15 DIAGNOSIS — N186 End stage renal disease: Secondary | ICD-10-CM | POA: Diagnosis not present

## 2018-11-17 DIAGNOSIS — N186 End stage renal disease: Secondary | ICD-10-CM | POA: Diagnosis not present

## 2018-11-17 DIAGNOSIS — D509 Iron deficiency anemia, unspecified: Secondary | ICD-10-CM | POA: Diagnosis not present

## 2018-11-17 DIAGNOSIS — D631 Anemia in chronic kidney disease: Secondary | ICD-10-CM | POA: Diagnosis not present

## 2018-11-17 DIAGNOSIS — N2581 Secondary hyperparathyroidism of renal origin: Secondary | ICD-10-CM | POA: Diagnosis not present

## 2018-11-19 DIAGNOSIS — I120 Hypertensive chronic kidney disease with stage 5 chronic kidney disease or end stage renal disease: Secondary | ICD-10-CM | POA: Diagnosis not present

## 2018-11-19 DIAGNOSIS — D509 Iron deficiency anemia, unspecified: Secondary | ICD-10-CM | POA: Diagnosis not present

## 2018-11-19 DIAGNOSIS — N2581 Secondary hyperparathyroidism of renal origin: Secondary | ICD-10-CM | POA: Diagnosis not present

## 2018-11-19 DIAGNOSIS — D631 Anemia in chronic kidney disease: Secondary | ICD-10-CM | POA: Diagnosis not present

## 2018-11-19 DIAGNOSIS — N186 End stage renal disease: Secondary | ICD-10-CM | POA: Diagnosis not present

## 2018-11-19 DIAGNOSIS — Z992 Dependence on renal dialysis: Secondary | ICD-10-CM | POA: Diagnosis not present

## 2018-11-22 DIAGNOSIS — N186 End stage renal disease: Secondary | ICD-10-CM | POA: Diagnosis not present

## 2018-11-22 DIAGNOSIS — D509 Iron deficiency anemia, unspecified: Secondary | ICD-10-CM | POA: Diagnosis not present

## 2018-11-22 DIAGNOSIS — N2581 Secondary hyperparathyroidism of renal origin: Secondary | ICD-10-CM | POA: Diagnosis not present

## 2018-11-22 DIAGNOSIS — D631 Anemia in chronic kidney disease: Secondary | ICD-10-CM | POA: Diagnosis not present

## 2018-11-23 DIAGNOSIS — F17209 Nicotine dependence, unspecified, with unspecified nicotine-induced disorders: Secondary | ICD-10-CM | POA: Diagnosis not present

## 2018-11-24 DIAGNOSIS — D631 Anemia in chronic kidney disease: Secondary | ICD-10-CM | POA: Diagnosis not present

## 2018-11-24 DIAGNOSIS — N2581 Secondary hyperparathyroidism of renal origin: Secondary | ICD-10-CM | POA: Diagnosis not present

## 2018-11-24 DIAGNOSIS — D509 Iron deficiency anemia, unspecified: Secondary | ICD-10-CM | POA: Diagnosis not present

## 2018-11-24 DIAGNOSIS — N186 End stage renal disease: Secondary | ICD-10-CM | POA: Diagnosis not present

## 2018-11-25 ENCOUNTER — Other Ambulatory Visit: Payer: Self-pay | Admitting: Internal Medicine

## 2018-11-26 DIAGNOSIS — D509 Iron deficiency anemia, unspecified: Secondary | ICD-10-CM | POA: Diagnosis not present

## 2018-11-26 DIAGNOSIS — D631 Anemia in chronic kidney disease: Secondary | ICD-10-CM | POA: Diagnosis not present

## 2018-11-26 DIAGNOSIS — N186 End stage renal disease: Secondary | ICD-10-CM | POA: Diagnosis not present

## 2018-11-26 DIAGNOSIS — N2581 Secondary hyperparathyroidism of renal origin: Secondary | ICD-10-CM | POA: Diagnosis not present

## 2018-11-29 DIAGNOSIS — N186 End stage renal disease: Secondary | ICD-10-CM | POA: Diagnosis not present

## 2018-11-29 DIAGNOSIS — N2581 Secondary hyperparathyroidism of renal origin: Secondary | ICD-10-CM | POA: Diagnosis not present

## 2018-11-29 DIAGNOSIS — D509 Iron deficiency anemia, unspecified: Secondary | ICD-10-CM | POA: Diagnosis not present

## 2018-11-29 DIAGNOSIS — D631 Anemia in chronic kidney disease: Secondary | ICD-10-CM | POA: Diagnosis not present

## 2018-12-01 DIAGNOSIS — N186 End stage renal disease: Secondary | ICD-10-CM | POA: Diagnosis not present

## 2018-12-01 DIAGNOSIS — D631 Anemia in chronic kidney disease: Secondary | ICD-10-CM | POA: Diagnosis not present

## 2018-12-01 DIAGNOSIS — D509 Iron deficiency anemia, unspecified: Secondary | ICD-10-CM | POA: Diagnosis not present

## 2018-12-01 DIAGNOSIS — N2581 Secondary hyperparathyroidism of renal origin: Secondary | ICD-10-CM | POA: Diagnosis not present

## 2018-12-03 DIAGNOSIS — D631 Anemia in chronic kidney disease: Secondary | ICD-10-CM | POA: Diagnosis not present

## 2018-12-03 DIAGNOSIS — N186 End stage renal disease: Secondary | ICD-10-CM | POA: Diagnosis not present

## 2018-12-03 DIAGNOSIS — N2581 Secondary hyperparathyroidism of renal origin: Secondary | ICD-10-CM | POA: Diagnosis not present

## 2018-12-03 DIAGNOSIS — D509 Iron deficiency anemia, unspecified: Secondary | ICD-10-CM | POA: Diagnosis not present

## 2018-12-06 DIAGNOSIS — D509 Iron deficiency anemia, unspecified: Secondary | ICD-10-CM | POA: Diagnosis not present

## 2018-12-06 DIAGNOSIS — N186 End stage renal disease: Secondary | ICD-10-CM | POA: Diagnosis not present

## 2018-12-06 DIAGNOSIS — D631 Anemia in chronic kidney disease: Secondary | ICD-10-CM | POA: Diagnosis not present

## 2018-12-06 DIAGNOSIS — N2581 Secondary hyperparathyroidism of renal origin: Secondary | ICD-10-CM | POA: Diagnosis not present

## 2018-12-07 DIAGNOSIS — F17209 Nicotine dependence, unspecified, with unspecified nicotine-induced disorders: Secondary | ICD-10-CM | POA: Diagnosis not present

## 2018-12-08 DIAGNOSIS — N186 End stage renal disease: Secondary | ICD-10-CM | POA: Diagnosis not present

## 2018-12-08 DIAGNOSIS — D631 Anemia in chronic kidney disease: Secondary | ICD-10-CM | POA: Diagnosis not present

## 2018-12-08 DIAGNOSIS — D509 Iron deficiency anemia, unspecified: Secondary | ICD-10-CM | POA: Diagnosis not present

## 2018-12-08 DIAGNOSIS — N2581 Secondary hyperparathyroidism of renal origin: Secondary | ICD-10-CM | POA: Diagnosis not present

## 2018-12-10 ENCOUNTER — Other Ambulatory Visit: Payer: Self-pay | Admitting: Nephrology

## 2018-12-10 ENCOUNTER — Other Ambulatory Visit (HOSPITAL_COMMUNITY): Payer: Self-pay | Admitting: Nephrology

## 2018-12-10 DIAGNOSIS — D509 Iron deficiency anemia, unspecified: Secondary | ICD-10-CM | POA: Diagnosis not present

## 2018-12-10 DIAGNOSIS — D631 Anemia in chronic kidney disease: Secondary | ICD-10-CM | POA: Diagnosis not present

## 2018-12-10 DIAGNOSIS — N186 End stage renal disease: Secondary | ICD-10-CM | POA: Diagnosis not present

## 2018-12-10 DIAGNOSIS — N2581 Secondary hyperparathyroidism of renal origin: Secondary | ICD-10-CM | POA: Diagnosis not present

## 2018-12-10 DIAGNOSIS — Z7682 Awaiting organ transplant status: Secondary | ICD-10-CM

## 2018-12-12 ENCOUNTER — Other Ambulatory Visit (INDEPENDENT_AMBULATORY_CARE_PROVIDER_SITE_OTHER): Payer: Self-pay | Admitting: Primary Care

## 2018-12-14 ENCOUNTER — Inpatient Hospital Stay (HOSPITAL_COMMUNITY): Admission: RE | Admit: 2018-12-14 | Payer: Medicare Other | Source: Ambulatory Visit

## 2018-12-14 DIAGNOSIS — D509 Iron deficiency anemia, unspecified: Secondary | ICD-10-CM | POA: Diagnosis not present

## 2018-12-14 DIAGNOSIS — D631 Anemia in chronic kidney disease: Secondary | ICD-10-CM | POA: Diagnosis not present

## 2018-12-14 DIAGNOSIS — N186 End stage renal disease: Secondary | ICD-10-CM | POA: Diagnosis not present

## 2018-12-14 DIAGNOSIS — N2581 Secondary hyperparathyroidism of renal origin: Secondary | ICD-10-CM | POA: Diagnosis not present

## 2018-12-15 DIAGNOSIS — N2581 Secondary hyperparathyroidism of renal origin: Secondary | ICD-10-CM | POA: Diagnosis not present

## 2018-12-15 DIAGNOSIS — D509 Iron deficiency anemia, unspecified: Secondary | ICD-10-CM | POA: Diagnosis not present

## 2018-12-15 DIAGNOSIS — N186 End stage renal disease: Secondary | ICD-10-CM | POA: Diagnosis not present

## 2018-12-15 DIAGNOSIS — D631 Anemia in chronic kidney disease: Secondary | ICD-10-CM | POA: Diagnosis not present

## 2018-12-16 ENCOUNTER — Other Ambulatory Visit (INDEPENDENT_AMBULATORY_CARE_PROVIDER_SITE_OTHER): Payer: Self-pay | Admitting: Primary Care

## 2018-12-16 ENCOUNTER — Other Ambulatory Visit: Payer: Self-pay

## 2018-12-16 ENCOUNTER — Ambulatory Visit (HOSPITAL_COMMUNITY)
Admission: RE | Admit: 2018-12-16 | Discharge: 2018-12-16 | Disposition: A | Discharge status: Home / Self Care | Payer: Medicare Other | Source: Ambulatory Visit | Attending: Cardiology | Admitting: Cardiology

## 2018-12-16 DIAGNOSIS — Z992 Dependence on renal dialysis: Secondary | ICD-10-CM | POA: Insufficient documentation

## 2018-12-16 DIAGNOSIS — J45909 Unspecified asthma, uncomplicated: Secondary | ICD-10-CM | POA: Diagnosis not present

## 2018-12-16 DIAGNOSIS — Z7682 Awaiting organ transplant status: Secondary | ICD-10-CM

## 2018-12-16 DIAGNOSIS — I509 Heart failure, unspecified: Secondary | ICD-10-CM | POA: Insufficient documentation

## 2018-12-16 DIAGNOSIS — Z6837 Body mass index (BMI) 37.0-37.9, adult: Secondary | ICD-10-CM | POA: Insufficient documentation

## 2018-12-16 DIAGNOSIS — F17200 Nicotine dependence, unspecified, uncomplicated: Secondary | ICD-10-CM | POA: Diagnosis not present

## 2018-12-16 DIAGNOSIS — I132 Hypertensive heart and chronic kidney disease with heart failure and with stage 5 chronic kidney disease, or end stage renal disease: Secondary | ICD-10-CM | POA: Diagnosis not present

## 2018-12-16 DIAGNOSIS — N186 End stage renal disease: Secondary | ICD-10-CM | POA: Insufficient documentation

## 2018-12-16 DIAGNOSIS — E669 Obesity, unspecified: Secondary | ICD-10-CM | POA: Diagnosis not present

## 2018-12-16 LAB — MYOCARDIAL PERFUSION IMAGING
LV dias vol: 180 mL (ref 62–150)
LV sys vol: 75 mL
Peak HR: 96 {beats}/min
Rest HR: 83 {beats}/min
SDS: 0
SRS: 0
SSS: 0

## 2018-12-16 MED ORDER — REGADENOSON 0.4 MG/5ML IV SOLN
0.4000 mg | Freq: Once | INTRAVENOUS | Status: AC
  Administered 2018-12-16: 0.4 mg via INTRAVENOUS

## 2018-12-16 MED ORDER — AMINOPHYLLINE 25 MG/ML IV SOLN
75.0000 mg | Freq: Once | INTRAVENOUS | Status: AC
  Administered 2018-12-16: 75 mg via INTRAVENOUS

## 2018-12-16 MED ORDER — TECHNETIUM TC 99M TETROFOSMIN IV KIT
28.6000 | PACK | Freq: Once | INTRAVENOUS | Status: AC | PRN
  Administered 2018-12-16: 28.6 via INTRAVENOUS
  Filled 2018-12-16: qty 29

## 2018-12-16 MED ORDER — TECHNETIUM TC 99M TETROFOSMIN IV KIT
9.8000 | PACK | Freq: Once | INTRAVENOUS | Status: AC | PRN
  Administered 2018-12-16: 9.8 via INTRAVENOUS
  Filled 2018-12-16: qty 10

## 2018-12-17 DIAGNOSIS — D631 Anemia in chronic kidney disease: Secondary | ICD-10-CM | POA: Diagnosis not present

## 2018-12-17 DIAGNOSIS — D509 Iron deficiency anemia, unspecified: Secondary | ICD-10-CM | POA: Diagnosis not present

## 2018-12-17 DIAGNOSIS — N2581 Secondary hyperparathyroidism of renal origin: Secondary | ICD-10-CM | POA: Diagnosis not present

## 2018-12-17 DIAGNOSIS — N186 End stage renal disease: Secondary | ICD-10-CM | POA: Diagnosis not present

## 2018-12-20 DIAGNOSIS — N186 End stage renal disease: Secondary | ICD-10-CM | POA: Diagnosis not present

## 2018-12-20 DIAGNOSIS — Z992 Dependence on renal dialysis: Secondary | ICD-10-CM | POA: Diagnosis not present

## 2018-12-20 DIAGNOSIS — N2581 Secondary hyperparathyroidism of renal origin: Secondary | ICD-10-CM | POA: Diagnosis not present

## 2018-12-20 DIAGNOSIS — D631 Anemia in chronic kidney disease: Secondary | ICD-10-CM | POA: Diagnosis not present

## 2018-12-20 DIAGNOSIS — I120 Hypertensive chronic kidney disease with stage 5 chronic kidney disease or end stage renal disease: Secondary | ICD-10-CM | POA: Diagnosis not present

## 2018-12-21 DIAGNOSIS — F17209 Nicotine dependence, unspecified, with unspecified nicotine-induced disorders: Secondary | ICD-10-CM | POA: Diagnosis not present

## 2018-12-22 DIAGNOSIS — N186 End stage renal disease: Secondary | ICD-10-CM | POA: Diagnosis not present

## 2018-12-22 DIAGNOSIS — N2581 Secondary hyperparathyroidism of renal origin: Secondary | ICD-10-CM | POA: Diagnosis not present

## 2018-12-22 DIAGNOSIS — D631 Anemia in chronic kidney disease: Secondary | ICD-10-CM | POA: Diagnosis not present

## 2018-12-24 DIAGNOSIS — D631 Anemia in chronic kidney disease: Secondary | ICD-10-CM | POA: Diagnosis not present

## 2018-12-24 DIAGNOSIS — N186 End stage renal disease: Secondary | ICD-10-CM | POA: Diagnosis not present

## 2018-12-24 DIAGNOSIS — N2581 Secondary hyperparathyroidism of renal origin: Secondary | ICD-10-CM | POA: Diagnosis not present

## 2018-12-27 DIAGNOSIS — D631 Anemia in chronic kidney disease: Secondary | ICD-10-CM | POA: Diagnosis not present

## 2018-12-27 DIAGNOSIS — N2581 Secondary hyperparathyroidism of renal origin: Secondary | ICD-10-CM | POA: Diagnosis not present

## 2018-12-27 DIAGNOSIS — N186 End stage renal disease: Secondary | ICD-10-CM | POA: Diagnosis not present

## 2018-12-28 ENCOUNTER — Other Ambulatory Visit: Payer: Self-pay | Admitting: Internal Medicine

## 2018-12-29 ENCOUNTER — Telehealth: Payer: Self-pay | Admitting: Pharmacist

## 2018-12-29 DIAGNOSIS — B20 Human immunodeficiency virus [HIV] disease: Secondary | ICD-10-CM

## 2018-12-29 DIAGNOSIS — N186 End stage renal disease: Secondary | ICD-10-CM | POA: Diagnosis not present

## 2018-12-29 DIAGNOSIS — N2581 Secondary hyperparathyroidism of renal origin: Secondary | ICD-10-CM | POA: Diagnosis not present

## 2018-12-29 DIAGNOSIS — D631 Anemia in chronic kidney disease: Secondary | ICD-10-CM | POA: Diagnosis not present

## 2018-12-29 MED ORDER — ZIDOVUDINE 300 MG PO TABS: 300.0000 mg | ORAL_TABLET | Freq: Every day | ORAL | 1 refills | Status: DC

## 2018-12-29 MED ORDER — DOLUTEGRAVIR-RILPIVIRINE 50-25 MG PO TABS: 1.0000 | ORAL_TABLET | Freq: Every day | ORAL | 1 refills | Status: DC

## 2018-12-29 MED FILL — JULUCA 50-25 MG TAB: 50-25 | 30 days supply | Qty: 30 | Fill #0

## 2018-12-29 MED FILL — ZIDOVUDINE 300 MG TABLET: 300 | 30 days supply | Qty: 30 | Fill #0

## 2018-12-29 NOTE — Telephone Encounter (Signed)
[name], pharmacist at [facility], called regarding patient and his refills.  Our clinic recently denied refills of his Juluca and AZT because he is seeing [name]. Patient told [name] that [name] was not managing his HIV and he was to see see [name] for this.  Patient hasn't been seen here in clinic since April 2019.  Will send in 2 months of his ART and he will be told to make an appointment with [name] for any future refills after that.

## 2018-12-31 DIAGNOSIS — N186 End stage renal disease: Secondary | ICD-10-CM | POA: Diagnosis not present

## 2018-12-31 DIAGNOSIS — N2581 Secondary hyperparathyroidism of renal origin: Secondary | ICD-10-CM | POA: Diagnosis not present

## 2018-12-31 DIAGNOSIS — D631 Anemia in chronic kidney disease: Secondary | ICD-10-CM | POA: Diagnosis not present

## 2019-01-03 ENCOUNTER — Inpatient Hospital Stay (HOSPITAL_COMMUNITY)
Admission: EM | Admit: 2019-01-03 | Discharge: 2019-01-04 | DRG: 640 | Disposition: A | Discharge status: Home / Self Care | Payer: Medicare Other | Attending: Student in an Organized Health Care Education/Training Program | Admitting: Student in an Organized Health Care Education/Training Program

## 2019-01-03 ENCOUNTER — Encounter (HOSPITAL_COMMUNITY): Payer: Self-pay | Admitting: *Deleted

## 2019-01-03 ENCOUNTER — Emergency Department (HOSPITAL_COMMUNITY): Payer: Medicare Other

## 2019-01-03 ENCOUNTER — Other Ambulatory Visit: Payer: Medicare Other

## 2019-01-03 ENCOUNTER — Other Ambulatory Visit: Payer: Self-pay

## 2019-01-03 ENCOUNTER — Other Ambulatory Visit: Payer: Self-pay | Admitting: *Deleted

## 2019-01-03 DIAGNOSIS — Z833 Family history of diabetes mellitus: Secondary | ICD-10-CM

## 2019-01-03 DIAGNOSIS — N400 Enlarged prostate without lower urinary tract symptoms: Secondary | ICD-10-CM | POA: Diagnosis present

## 2019-01-03 DIAGNOSIS — B20 Human immunodeficiency virus [HIV] disease: Secondary | ICD-10-CM

## 2019-01-03 DIAGNOSIS — Z9114 Patient's other noncompliance with medication regimen: Secondary | ICD-10-CM

## 2019-01-03 DIAGNOSIS — E785 Hyperlipidemia, unspecified: Secondary | ICD-10-CM | POA: Diagnosis present

## 2019-01-03 DIAGNOSIS — Z1159 Encounter for screening for other viral diseases: Secondary | ICD-10-CM | POA: Diagnosis not present

## 2019-01-03 DIAGNOSIS — D631 Anemia in chronic kidney disease: Secondary | ICD-10-CM | POA: Diagnosis present

## 2019-01-03 DIAGNOSIS — R569 Unspecified convulsions: Secondary | ICD-10-CM | POA: Diagnosis present

## 2019-01-03 DIAGNOSIS — Z820 Family history of epilepsy and other diseases of the nervous system: Secondary | ICD-10-CM | POA: Diagnosis not present

## 2019-01-03 DIAGNOSIS — I272 Pulmonary hypertension, unspecified: Secondary | ICD-10-CM | POA: Diagnosis not present

## 2019-01-03 DIAGNOSIS — F17210 Nicotine dependence, cigarettes, uncomplicated: Secondary | ICD-10-CM | POA: Diagnosis present

## 2019-01-03 DIAGNOSIS — Z7951 Long term (current) use of inhaled steroids: Secondary | ICD-10-CM

## 2019-01-03 DIAGNOSIS — R7989 Other specified abnormal findings of blood chemistry: Secondary | ICD-10-CM | POA: Diagnosis not present

## 2019-01-03 DIAGNOSIS — Z79899 Other long term (current) drug therapy: Secondary | ICD-10-CM

## 2019-01-03 DIAGNOSIS — I120 Hypertensive chronic kidney disease with stage 5 chronic kidney disease or end stage renal disease: Secondary | ICD-10-CM | POA: Diagnosis not present

## 2019-01-03 DIAGNOSIS — I5032 Chronic diastolic (congestive) heart failure: Secondary | ICD-10-CM | POA: Diagnosis present

## 2019-01-03 DIAGNOSIS — N186 End stage renal disease: Secondary | ICD-10-CM | POA: Diagnosis present

## 2019-01-03 DIAGNOSIS — R0902 Hypoxemia: Secondary | ICD-10-CM | POA: Diagnosis not present

## 2019-01-03 DIAGNOSIS — I132 Hypertensive heart and chronic kidney disease with heart failure and with stage 5 chronic kidney disease, or end stage renal disease: Secondary | ICD-10-CM | POA: Diagnosis present

## 2019-01-03 DIAGNOSIS — I340 Nonrheumatic mitral (valve) insufficiency: Secondary | ICD-10-CM | POA: Diagnosis present

## 2019-01-03 DIAGNOSIS — N179 Acute kidney failure, unspecified: Secondary | ICD-10-CM | POA: Diagnosis present

## 2019-01-03 DIAGNOSIS — M170 Bilateral primary osteoarthritis of knee: Secondary | ICD-10-CM | POA: Diagnosis present

## 2019-01-03 DIAGNOSIS — Z992 Dependence on renal dialysis: Secondary | ICD-10-CM

## 2019-01-03 DIAGNOSIS — E781 Pure hyperglyceridemia: Secondary | ICD-10-CM | POA: Diagnosis present

## 2019-01-03 DIAGNOSIS — R069 Unspecified abnormalities of breathing: Secondary | ICD-10-CM | POA: Diagnosis not present

## 2019-01-03 DIAGNOSIS — M5416 Radiculopathy, lumbar region: Secondary | ICD-10-CM | POA: Diagnosis present

## 2019-01-03 DIAGNOSIS — D539 Nutritional anemia, unspecified: Secondary | ICD-10-CM | POA: Diagnosis not present

## 2019-01-03 DIAGNOSIS — Z8249 Family history of ischemic heart disease and other diseases of the circulatory system: Secondary | ICD-10-CM | POA: Diagnosis not present

## 2019-01-03 DIAGNOSIS — J181 Lobar pneumonia, unspecified organism: Secondary | ICD-10-CM | POA: Diagnosis not present

## 2019-01-03 DIAGNOSIS — R062 Wheezing: Secondary | ICD-10-CM | POA: Diagnosis not present

## 2019-01-03 DIAGNOSIS — M109 Gout, unspecified: Secondary | ICD-10-CM | POA: Diagnosis present

## 2019-01-03 DIAGNOSIS — R000 Tachycardia, unspecified: Secondary | ICD-10-CM | POA: Diagnosis not present

## 2019-01-03 DIAGNOSIS — Z20828 Contact with and (suspected) exposure to other viral communicable diseases: Secondary | ICD-10-CM | POA: Diagnosis not present

## 2019-01-03 DIAGNOSIS — R0602 Shortness of breath: Secondary | ICD-10-CM | POA: Diagnosis not present

## 2019-01-03 DIAGNOSIS — Z9115 Patient's noncompliance with renal dialysis: Secondary | ICD-10-CM | POA: Diagnosis not present

## 2019-01-03 DIAGNOSIS — Z809 Family history of malignant neoplasm, unspecified: Secondary | ICD-10-CM

## 2019-01-03 DIAGNOSIS — E8779 Other fluid overload: Principal | ICD-10-CM | POA: Diagnosis present

## 2019-01-03 DIAGNOSIS — Z825 Family history of asthma and other chronic lower respiratory diseases: Secondary | ICD-10-CM

## 2019-01-03 DIAGNOSIS — Z21 Asymptomatic human immunodeficiency virus [HIV] infection status: Secondary | ICD-10-CM | POA: Diagnosis not present

## 2019-01-03 DIAGNOSIS — J45909 Unspecified asthma, uncomplicated: Secondary | ICD-10-CM | POA: Diagnosis present

## 2019-01-03 DIAGNOSIS — F419 Anxiety disorder, unspecified: Secondary | ICD-10-CM | POA: Diagnosis present

## 2019-01-03 DIAGNOSIS — N2581 Secondary hyperparathyroidism of renal origin: Secondary | ICD-10-CM | POA: Diagnosis not present

## 2019-01-03 DIAGNOSIS — J449 Chronic obstructive pulmonary disease, unspecified: Secondary | ICD-10-CM | POA: Diagnosis not present

## 2019-01-03 DIAGNOSIS — Z7682 Awaiting organ transplant status: Secondary | ICD-10-CM

## 2019-01-03 DIAGNOSIS — E8770 Fluid overload, unspecified: Secondary | ICD-10-CM

## 2019-01-03 DIAGNOSIS — Z209 Contact with and (suspected) exposure to unspecified communicable disease: Secondary | ICD-10-CM | POA: Diagnosis not present

## 2019-01-03 LAB — TROPONIN I: Troponin I: 0.04 ng/mL (ref <0.03)

## 2019-01-03 LAB — COMPREHENSIVE METABOLIC PANEL
ALT: 16 U/L (ref 0–44)
AST: 22 U/L (ref 15–41)
Albumin: 3.8 g/dL (ref 3.5–5.0)
Alkaline Phosphatase: 74 U/L (ref 38–126)
Anion gap: 19 — ABNORMAL HIGH (ref 5–15)
BUN: 91 mg/dL — ABNORMAL HIGH (ref 6–20)
CO2: 27 mmol/L (ref 22–32)
Calcium: 8.8 mg/dL — ABNORMAL LOW (ref 8.9–10.3)
Chloride: 93 mmol/L — ABNORMAL LOW (ref 98–111)
Creatinine, Ser: 15.43 mg/dL — ABNORMAL HIGH (ref 0.61–1.24)
GFR calc Af Amer: 4 mL/min — ABNORMAL LOW (ref >60)
GFR calc non Af Amer: 3 mL/min — ABNORMAL LOW (ref >60)
Glucose, Bld: 88 mg/dL (ref 70–99)
Potassium: 4 mmol/L (ref 3.5–5.1)
Sodium: 139 mmol/L (ref 135–145)
Total Bilirubin: 0.8 mg/dL (ref 0.3–1.2)
Total Protein: 7.4 g/dL (ref 6.5–8.1)

## 2019-01-03 LAB — CBC WITH DIFFERENTIAL/PLATELET
Abs Immature Granulocytes: 0.03 10*3/uL (ref 0.00–0.07)
Basophils Absolute: 0.1 10*3/uL (ref 0.0–0.1)
Basophils Relative: 1 %
Eosinophils Absolute: 0.3 10*3/uL (ref 0.0–0.5)
Eosinophils Relative: 3 %
HCT: 33.1 % — ABNORMAL LOW (ref 39.0–52.0)
Hemoglobin: 10.9 g/dL — ABNORMAL LOW (ref 13.0–17.0)
Immature Granulocytes: 0 %
Lymphocytes Relative: 10 %
Lymphs Abs: 0.9 10*3/uL (ref 0.7–4.0)
MCH: 33.9 pg (ref 26.0–34.0)
MCHC: 32.9 g/dL (ref 30.0–36.0)
MCV: 102.8 fL — ABNORMAL HIGH (ref 80.0–100.0)
Monocytes Absolute: 0.6 10*3/uL (ref 0.1–1.0)
Monocytes Relative: 7 %
Neutro Abs: 7.3 10*3/uL (ref 1.7–7.7)
Neutrophils Relative %: 79 %
Platelets: 177 10*3/uL (ref 150–400)
RBC: 3.22 MIL/uL — ABNORMAL LOW (ref 4.22–5.81)
RDW: 14.9 % (ref 11.5–15.5)
WBC: 9.3 10*3/uL (ref 4.0–10.5)
nRBC: 0 % (ref 0.0–0.2)

## 2019-01-03 LAB — BRAIN NATRIURETIC PEPTIDE: B Natriuretic Peptide: 698.1 pg/mL — ABNORMAL HIGH (ref 0.0–100.0)

## 2019-01-03 LAB — MRSA PCR SCREENING: MRSA by PCR: NEGATIVE

## 2019-01-03 LAB — SARS CORONAVIRUS 2: SARS Coronavirus 2: NOT DETECTED

## 2019-01-03 MED ORDER — ONDANSETRON HCL 4 MG/2ML IJ SOLN: 4.0000 mg | Freq: Four times a day (QID) | INTRAMUSCULAR | Status: DC | PRN

## 2019-01-03 MED ORDER — HEPARIN SODIUM (PORCINE) 1000 UNIT/ML DIALYSIS
1000.0000 [IU] | INTRAMUSCULAR | Status: DC | PRN
  Filled 2019-01-03: qty 1

## 2019-01-03 MED ORDER — IPRATROPIUM-ALBUTEROL 0.5-2.5 (3) MG/3ML IN SOLN: 3.0000 mL | Freq: Four times a day (QID) | RESPIRATORY_TRACT | Status: DC | PRN

## 2019-01-03 MED ORDER — FERRIC CITRATE 1 GM 210 MG(FE) PO TABS
630.0000 mg | ORAL_TABLET | Freq: Three times a day (TID) | ORAL | Status: DC
  Administered 2019-01-03 – 2019-01-04 (×2): 630 mg via ORAL
  Filled 2019-01-03 (×2): qty 3

## 2019-01-03 MED ORDER — LIDOCAINE-PRILOCAINE 2.5-2.5 % EX CREA
1.0000 "application " | TOPICAL_CREAM | CUTANEOUS | Status: DC | PRN
  Filled 2019-01-03: qty 5

## 2019-01-03 MED ORDER — DOLUTEGRAVIR-RILPIVIRINE 50-25 MG PO TABS: 1.0000 | ORAL_TABLET | Freq: Every day | ORAL | Status: DC

## 2019-01-03 MED ORDER — DOXAZOSIN MESYLATE 2 MG PO TABS
8.0000 mg | ORAL_TABLET | Freq: Every day | ORAL | Status: DC
  Administered 2019-01-03: 8 mg via ORAL
  Filled 2019-01-03: qty 4
  Filled 2019-01-03: qty 1

## 2019-01-03 MED ORDER — NITROGLYCERIN 2 % TD OINT
1.0000 [in_us] | TOPICAL_OINTMENT | Freq: Once | TRANSDERMAL | Status: AC
  Administered 2019-01-03: 08:00:00 1 [in_us] via TOPICAL
  Filled 2019-01-03: qty 1

## 2019-01-03 MED ORDER — CHLORHEXIDINE GLUCONATE CLOTH 2 % EX PADS: 6.0000 | MEDICATED_PAD | Freq: Every day | CUTANEOUS | Status: DC

## 2019-01-03 MED ORDER — DOLUTEGRAVIR SODIUM 50 MG PO TABS
50.0000 mg | ORAL_TABLET | Freq: Every day | ORAL | Status: DC
  Administered 2019-01-03 (×2): 50 mg via ORAL
  Filled 2019-01-03 (×2): qty 1

## 2019-01-03 MED ORDER — RILPIVIRINE HCL 25 MG PO TABS
25.0000 mg | ORAL_TABLET | Freq: Every day | ORAL | Status: DC
  Filled 2019-01-03 (×2): qty 1

## 2019-01-03 MED ORDER — MOMETASONE FURO-FORMOTEROL FUM 200-5 MCG/ACT IN AERO
2.0000 | INHALATION_SPRAY | Freq: Two times a day (BID) | RESPIRATORY_TRACT | Status: DC
  Administered 2019-01-04: 2 via RESPIRATORY_TRACT
  Filled 2019-01-03: qty 8.8

## 2019-01-03 MED ORDER — AMLODIPINE BESYLATE 10 MG PO TABS
10.0000 mg | ORAL_TABLET | Freq: Every day | ORAL | Status: DC
  Administered 2019-01-03 – 2019-01-04 (×2): 10 mg via ORAL
  Filled 2019-01-03 (×2): qty 1

## 2019-01-03 MED ORDER — NICOTINE 7 MG/24HR TD PT24: 7.0000 mg | MEDICATED_PATCH | Freq: Every day | TRANSDERMAL | Status: DC

## 2019-01-03 MED ORDER — HEPARIN SODIUM (PORCINE) 5000 UNIT/ML IJ SOLN
5000.0000 [IU] | Freq: Three times a day (TID) | INTRAMUSCULAR | Status: DC
  Administered 2019-01-03 – 2019-01-04 (×3): 5000 [IU] via SUBCUTANEOUS
  Filled 2019-01-03 (×2): qty 1

## 2019-01-03 MED ORDER — ONDANSETRON HCL 4 MG PO TABS: 4.0000 mg | ORAL_TABLET | Freq: Four times a day (QID) | ORAL | Status: DC | PRN

## 2019-01-03 MED ORDER — PENTAFLUOROPROP-TETRAFLUOROETH EX AERO
1.0000 "application " | INHALATION_SPRAY | CUTANEOUS | Status: DC | PRN
  Filled 2019-01-03: qty 116

## 2019-01-03 MED ORDER — PRAVASTATIN SODIUM 40 MG PO TABS
40.0000 mg | ORAL_TABLET | Freq: Every day | ORAL | Status: DC
  Administered 2019-01-03 – 2019-01-04 (×2): 40 mg via ORAL
  Filled 2019-01-03 (×2): qty 1

## 2019-01-03 MED ORDER — LIDOCAINE HCL (PF) 1 % IJ SOLN: 5.0000 mL | INTRAMUSCULAR | Status: DC | PRN

## 2019-01-03 MED ORDER — LORATADINE 10 MG PO TABS
10.0000 mg | ORAL_TABLET | Freq: Every day | ORAL | Status: DC | PRN
  Administered 2019-01-03: 10 mg via ORAL
  Filled 2019-01-03: qty 1

## 2019-01-03 MED ORDER — ZIDOVUDINE 100 MG PO CAPS
300.0000 mg | ORAL_CAPSULE | Freq: Every day | ORAL | Status: DC
  Administered 2019-01-03 – 2019-01-04 (×2): 300 mg via ORAL
  Filled 2019-01-03 (×2): qty 3

## 2019-01-03 MED ORDER — ACETAMINOPHEN 650 MG RE SUPP: 650.0000 mg | Freq: Four times a day (QID) | RECTAL | Status: DC | PRN

## 2019-01-03 MED ORDER — SODIUM CHLORIDE 0.9 % IV SOLN: 100.0000 mL | INTRAVENOUS | Status: DC | PRN

## 2019-01-03 MED ORDER — CARVEDILOL 25 MG PO TABS
25.0000 mg | ORAL_TABLET | Freq: Two times a day (BID) | ORAL | Status: DC
  Administered 2019-01-03 – 2019-01-04 (×2): 25 mg via ORAL
  Filled 2019-01-03 (×2): qty 1

## 2019-01-03 MED ORDER — ACETAMINOPHEN 325 MG PO TABS
650.0000 mg | ORAL_TABLET | Freq: Once | ORAL | Status: AC
  Administered 2019-01-03: 16:00:00 650 mg via ORAL
  Filled 2019-01-03: qty 2

## 2019-01-03 MED ORDER — ACETAMINOPHEN 325 MG PO TABS
650.0000 mg | ORAL_TABLET | Freq: Four times a day (QID) | ORAL | Status: DC | PRN
  Administered 2019-01-03: 650 mg via ORAL
  Filled 2019-01-03: qty 2

## 2019-01-03 MED ORDER — ACETAMINOPHEN 500 MG PO TABS
1000.0000 mg | ORAL_TABLET | Freq: Once | ORAL | Status: AC
  Administered 2019-01-03: 08:00:00 1000 mg via ORAL
  Filled 2019-01-03: qty 2

## 2019-01-03 MED ORDER — SENNOSIDES-DOCUSATE SODIUM 8.6-50 MG PO TABS: 1.0000 | ORAL_TABLET | Freq: Every evening | ORAL | Status: DC | PRN

## 2019-01-03 MED ORDER — FUROSEMIDE 10 MG/ML IJ SOLN: 40.0000 mg | Freq: Once | INTRAMUSCULAR | Status: DC

## 2019-01-03 NOTE — ED Notes (Signed)
MD at bedside. 

## 2019-01-03 NOTE — ED Notes (Signed)
ED TO INPATIENT HANDOFF REPORT  ED Nurse Name and Phone #:   S Name/Age/Gender Darrell Johnson 53 y.o. male Room/Bed: 015C/015C  Code Status   Code Status: Prior  Home/SNF/Other Home Patient oriented to: self, place, time and situation Is this baseline? Yes   Triage Complete: Triage complete  Chief Complaint sob  Triage Note The pt arrived by gems from home sob tonight his inhalers are not helping.  He arrived on a non-rebreather  No pain anywhere dialysis pt that is duje for dialysis today  Dialysis not  Completed on friday   Allergies No Known Allergies  Level of Care/Admitting Diagnosis ED Disposition    None      B Medical/Surgery History Past Medical History:  Diagnosis Date  . Anemia   . Anxiety   . Arthritis    "knees" (04/13/2017)  . Asthma   . CHF (congestive heart failure) ([place])   . ESRD (end stage renal disease) on dialysis [facility])    "MWF; [facility]" (04/13/2017)  . Gout, unspecified 08/13/2009   Qualifier: Diagnosis of  By: Redmond Pulling  MD, Mateo Flow    . H1N1 influenza    March 2016  . History of blood transfusion 02/2017   "related to OR"  . HIV infection ([place]) 1980's   on ART therapy since, followed by ID clinic, complicated  by neuropathy  . Hyperlipidemia    hypertrygliceridemia determined ti be secondary to ART therpay  . HYPERTENSION 05/08/2006  . Pneumonia    "once; years ago" (04/13/2017)  . Rib fractures 01/2009  . Seizures ([place])    last seizure was in the 1990s, pt has family history of seizures; "probably related to alcohol" (04/13/2017)  . 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 Syphilis 1997   history of syphilis 1997  . Tobacco abuse    Past Surgical History:  Procedure Laterality Date  . AV FISTULA PLACEMENT Right 12/02/2013   Procedure: ARTERIOVENOUS (AV) FISTULA CREATION;  Surgeon: Rosetta Posner, MD;  Location: [place];  Service: Vascular;  Laterality: Right;  . INCISION AND DRAINAGE ABSCESS Right 03/09/2017   Procedure: INCISION AND DRAINAGE Right Scrotal Abscess;  Surgeon: Franchot Gallo, MD;  Location: [facility] [place];  Service: Urology;  Laterality: Right;  . THROMBECTOMY Right ~ 2016   "AV fistula clotted off"     A IV Location/Drains/Wounds Patient Lines/Drains/Airways Status   Active Line/Drains/Airways    Name:   Placement date:   Placement time:   Site:   Days:   Peripheral IV 01/03/19 Left;Posterior Wrist   01/03/19    0651    Wrist   less than 1   Fistula / Graft Right Forearm Arteriovenous fistula   12/02/13    1411    Forearm   1858   Fistula / Graft Right Forearm   -    -    Forearm      Fistula / Graft Right Forearm   10/13/17    2100    Forearm   447   Incision (Closed) 03/09/17 Perineum Right   03/09/17    2137     665          Intake/Output Last 24 hours No intake or output data in the 24 hours ending 01/03/19 0851  Labs/Imaging Results for orders placed or performed during the hospital encounter of 01/03/19 (  from the past 48 hour(s))  Comprehensive metabolic panel     Status: Abnormal   Collection Time: 01/03/19  6:16 AM  Result Value Ref Range   Sodium 139 135 - 145 mmol/L   Potassium 4.0 3.5 - 5.1 mmol/L   Chloride 93 (L) 98 - 111 mmol/L   CO2 27 22 - 32 mmol/L   Glucose, Bld 88 70 - 99 mg/dL   BUN 91 (H) 6 - 20 mg/dL   Creatinine, Ser 15.43 (H) 0.61 - 1.24 mg/dL   Calcium 8.8 (L) 8.9 - 10.3 mg/dL   Total Protein 7.4 6.5 - 8.1 g/dL   Albumin 3.8 3.5 - 5.0 g/dL   AST 22 15 - 41 U/L   ALT 16 0 - 44 U/L   Alkaline Phosphatase 74 38 - 126 U/L   Total Bilirubin 0.8 0.3 - 1.2 mg/dL   GFR calc non Af Amer 3 (L) >60 mL/min   GFR calc Af Amer 4 (L) >60 mL/min   Anion gap 19 (H) 5 - 15    Comment: Performed at [facility], [address] [place], [place] [address]  CBC with Differential     Status: Abnormal   Collection Time: 01/03/19  6:16  AM  Result Value Ref Range   WBC 9.3 4.0 - 10.5 K/uL   RBC 3.22 (L) 4.22 - 5.81 MIL/uL   Hemoglobin 10.9 (L) 13.0 - 17.0 g/dL   HCT 33.1 (L) 39.0 - 52.0 %   MCV 102.8 (H) 80.0 - 100.0 fL   MCH 33.9 26.0 - 34.0 pg   MCHC 32.9 30.0 - 36.0 g/dL   RDW 14.9 11.5 - 15.5 %   Platelets 177 150 - 400 K/uL   nRBC 0.0 0.0 - 0.2 %   Neutrophils Relative % 79 %   Neutro Abs 7.3 1.7 - 7.7 K/uL   Lymphocytes Relative 10 %   Lymphs Abs 0.9 0.7 - 4.0 K/uL   Monocytes Relative 7 %   Monocytes Absolute 0.6 0.1 - 1.0 K/uL   Eosinophils Relative 3 %   Eosinophils Absolute 0.3 0.0 - 0.5 K/uL   Basophils Relative 1 %   Basophils Absolute 0.1 0.0 - 0.1 K/uL   Immature Granulocytes 0 %   Abs Immature Granulocytes 0.03 0.00 - 0.07 K/uL    Comment: Performed at [facility] [address], [place], [place] [address]  Brain natriuretic peptide     Status: Abnormal   Collection Time: 01/03/19  6:16 AM  Result Value Ref Range   B Natriuretic Peptide 698.1 (H) 0.0 - 100.0 pg/mL    Comment: Performed at [facility] [address], [place], [place] [address]  Troponin I - Once     Status: Abnormal   Collection Time: 01/03/19  6:16 AM  Result Value Ref Range   Troponin I 0.04 (HH) <0.03 ng/mL    Comment: CRITICAL RESULT CALLED TO, READ BACK BY AND VERIFIED WITH: [facility] RN @ [phone] 01/03/19 LEONARD,A Performed at [facility], [place] [address], [place], [place] [address]    Dg Chest Port 1 View  Result Date: 01/03/2019 CLINICAL DATA:  Shortness of breath. EXAM: PORTABLE CHEST 1 VIEW COMPARISON:  10/13/2017. FINDINGS: Cardiomegaly. Diffuse bilateral pulmonary infiltrates/edema. Small right pleural effusion. No pneumothorax. Old right rib fractures. IMPRESSION: Cardiomegaly with diffuse bilateral pulmonary infiltrates/edema and small right-sided pleural effusion. CHF could present in this fashion. Electronically Signed   By: Marcello Moores  Register   On:  01/03/2019 06:10    Pending  Labs Unresulted Labs (From admission, onward)    Start     Ordered   01/03/19 0534  SARS Coronavirus 2  Once,   R     01/03/19 0534   Signed and Held  CBC  Once,   R     Signed and Held          Vitals/Pain Today's Vitals   01/03/19 0745 01/03/19 0800 01/03/19 0830 01/03/19 0845  BP: (!) 188/101 (!) 172/92 (!) 178/81 (!) 183/88  Pulse: 79 74    Resp: 20 19 16 15   Temp:      TempSrc:      SpO2: 100% 100%  100%  Weight:      Height:      PainSc:        Isolation Precautions Droplet and Contact precautions  Medications Medications  Chlorhexidine Gluconate Cloth 2 % PADS 6 each (has no administration in time range)  pentafluoroprop-tetrafluoroeth (GEBAUERS) aerosol 1 application (has no administration in time range)  lidocaine (PF) (XYLOCAINE) 1 % injection 5 mL (has no administration in time range)  lidocaine-prilocaine (EMLA) cream 1 application (has no administration in time range)  0.9 %  sodium chloride infusion (has no administration in time range)  heparin injection 1,000 Units (has no administration in time range)  acetaminophen (TYLENOL) tablet 1,000 mg (1,000 mg Oral Given 01/03/19 0739)  nitroGLYCERIN (NITROGLYN) 2 % ointment 1 inch (1 inch Topical Given 01/03/19 0741)    Mobility walks Low fall risk   Focused Assessments Pulmonary Assessment Handoff:  Lung sounds:   O2 Device: NRB O2 Flow Rate (L/min): 15 L/min      R Recommendations: See Admitting Provider Note  Report given to:   Additional Notes:  Patient's chest x-ray consistent with volume overload.  Patient missed the majority of his dialysis on Friday.  Patient's laboratory studies are pending.  Nephrology will be consulted as this patient will likely require emergent dialysis.  He continues on oxygen by nonrebreather.  Patient was given an inch of nitroglycerin paste.

## 2019-01-03 NOTE — ED Notes (Signed)
Attempted blood draw & unsuccessful 

## 2019-01-03 NOTE — Procedures (Signed)
Patient seen and examined on Hemodialysis. BP (!) 174/106   Pulse 75   Temp 98.5 F (36.9 C) (Oral)   Resp 15   Ht 5\' 9"  (1.753 m)   Wt 118.1 kg   SpO2 98%   BMI 38.45 kg/m   QB400 via AVF, UF goal 2.5 net--up to 3.5 net given SOB, increased WOB, hypertension  Tolerating treatment without complaints at this time. Needs to challenge EDW.   Madelon Lips MD [facility] pgr [phone] 11:21 AM

## 2019-01-03 NOTE — Consult Note (Signed)
Reason for Consult: To manage dialysis and dialysis related needs Referring Physician: Dr. Danie Chandler is an 53 y.o. male.  HPI: Pt is a 37M with a PMH sig for HTN, HLD, HIV, CHF, and ESRD on HD who is now seen in consultation at the request of Dr. Stark Jock for evaluation and management of ESRD on HD and provision of HD.     Briefly, pt started having SOB late yesterday evening and into the AM.  He was supposed to have HD today but SOB so bad he called EMS to bring him to [facility] ED.  Here, labs as follows: Na 139, K 4.0, CO2 27, BUN 91, Cr 15.4, WBC 9.3, Hgb 10.9, plts 177.  Trop 0.04 with EKG no acute changes.  COVID testing negative.  CXR with pulm edema.    Cut HD short Friday to attend a funeral.  Usually is 4 hrs 30 min.  Has been leaving below EDW by 0.6 kg.  No f/c, n/v.  No real CP.  Some LE edema.    Dialyzes at [facility]  4 hr 30 min F200 dialyzer BFR 475  EDW115 kg HD Bath 2K 2 Ca  UF profile 2 AVF  Parsabiv 10 q rx Heparin 6000 bolus with 3000 mid-run Calcitriol 3.75 q rx   Past Medical History:  Diagnosis Date  . Anemia   . Anxiety   . Arthritis    "knees" (04/13/2017)  . Asthma   . CHF (congestive heart failure) ([place])   . ESRD (end stage renal disease) on dialysis [facility])    "MWF; [facility]" (04/13/2017)  . Gout, unspecified 08/13/2009   Qualifier: Diagnosis of  By: Redmond Pulling  MD, Mateo Flow    . H1N1 influenza    March 2016  . History of blood transfusion 02/2017   "related to OR"  . HIV infection ([place]) 1980's   on ART therapy since, followed by ID clinic, complicated  by neuropathy  . Hyperlipidemia    hypertrygliceridemia determined ti be secondary to ART therpay  . HYPERTENSION 05/08/2006  . Pneumonia    "once; years ago" (04/13/2017)  . Rib fractures 01/2009  . Seizures ([place])    last seizure was in the 1990s, pt has family history of seizures; "probably related to alcohol" (04/13/2017)  . Sexually transmitted disease    gonorrhea and trichomonas, penile  condylomata - s/p circu,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 that was the reason for her at all as if she is a  . Syphilis 1997   history of syphilis 1997  . Tobacco abuse     Past Surgical History:  Procedure Laterality Date  . AV FISTULA PLACEMENT Right 12/02/2013   Procedure: ARTERIOVENOUS (AV) FISTULA CREATION;  Surgeon: Rosetta Posner, MD;  Location: [facility];  Service: Vascular;  Laterality: Right;  . INCISION AND DRAINAGE ABSCESS Right 03/09/2017   Procedure: INCISION AND DRAINAGE Right Scrotal Abscess;  Surgeon: Franchot Gallo, MD;  Location: [facility];  Service: Urology;  Laterality: Right;  . THROMBECTOMY Right ~ 2016   "AV fistula clotted off"    Family History  Problem Relation Age of Onset  . Cancer Mother   . Hypertension Mother   . COPD Father   . Hypertension Father   . Diabetes Sister   . Hypertension Sister   . Diabetes Brother   . Hypertension Brother   . Stroke Neg Hx     Social History:  reports that he has been smoking cigarettes. He has a 2.50 pack-year smoking  history. He has never used smokeless tobacco. He reports that he does not drink alcohol or use drugs.  Allergies: No Known Allergies  Medications: I have reviewed the patient's current medications.   Results for orders placed or performed during the hospital encounter of 01/03/19 (from the past 48 hour(s))  SARS Coronavirus 2     Status: None   Collection Time: 01/03/19  5:34 AM  Result Value Ref Range   SARS Coronavirus 2 NOT DETECTED NOT DETECTED    Comment: (NOTE) SARS-CoV-2 target nucleic acids are NOT DETECTED. The SARS-CoV-2 RNA is generally detectable in upper and lower respiratory specimens during the acute phase of infection.  Negative  results do not preclude SARS-CoV-2 infection, do not rule out co-infections with other pathogens, and should not be used as the sole basis for treatment or other patient management decisions.  Negative results must be combined with clinical  observations, patient history, and epidemiological information. The expected result is Not Detected. Fact Sheet for Patients: http://www.biofiredefense.com/wp-content/uploads/2020/03/BIOFIRE-COVID -19-patients.pdf Fact Sheet for Healthcare Providers: http://www.biofiredefense.com/wp-content/uploads/2020/03/BIOFIRE-COVID -19-hcp.pdf This test is not yet approved or cleared by the [place] and  has been authorized for detection and/or diagnosis of SARS-CoV-2 by FDA under an Emergency Use Authorization (EUA).  This EUA will remain in effec t (meaning this test can be used) for the duration of  the COVID-19 declaration under Section 564(b)(1) of the Act, 21 U.S.C. section 360bbb-3(b)(1), unless the authorization is terminated or revoked sooner. Performed at [facility], [place] [address], [place], [place] [address]   Comprehensive metabolic panel     Status: Abnormal   Collection Time: 01/03/19  6:16 AM  Result Value Ref Range   Sodium 139 135 - 145 mmol/L   Potassium 4.0 3.5 - 5.1 mmol/L   Chloride 93 (L) 98 - 111 mmol/L   CO2 27 22 - 32 mmol/L   Glucose, Bld 88 70 - 99 mg/dL   BUN 91 (H) 6 - 20 mg/dL   Creatinine, Ser 15.43 (H) 0.61 - 1.24 mg/dL   Calcium 8.8 (L) 8.9 - 10.3 mg/dL   Total Protein 7.4 6.5 - 8.1 g/dL   Albumin 3.8 3.5 - 5.0 g/dL   AST 22 15 - 41 U/L   ALT 16 0 - 44 U/L   Alkaline Phosphatase 74 38 - 126 U/L   Total Bilirubin 0.8 0.3 - 1.2 mg/dL   GFR calc non Af Amer 3 (L) >60 mL/min   GFR calc Af Amer 4 (L) >60 mL/min   Anion gap 19 (H) 5 - 15    Comment: Performed at [facility], [place] [address] [place], [place] [address]  CBC with Differential     Status: Abnormal   Collection Time: 01/03/19  6:16 AM  Result Value Ref Range   WBC 9.3 4.0 - 10.5 K/uL   RBC 3.22 (L) 4.22 - 5.81 MIL/uL   Hemoglobin 10.9 (L) 13.0 - 17.0 g/dL   HCT 33.1 (L) 39.0 - 52.0 %   MCV 102.8 (H) 80.0 - 100.0 fL   MCH 33.9 26.0 - 34.0 pg   MCHC 32.9 30.0 - 36.0  g/dL   RDW 14.9 11.5 - 15.5 %   Platelets 177 150 - 400 K/uL   nRBC 0.0 0.0 - 0.2 %   Neutrophils Relative % 79 %   Neutro Abs 7.3 1.7 - 7.7 K/uL   Lymphocytes Relative 10 %   Lymphs Abs 0.9 0.7 - 4.0 K/uL   Monocytes Relative  7 %   Monocytes Absolute 0.6 0.1 - 1.0 K/uL   Eosinophils Relative 3 %   Eosinophils Absolute 0.3 0.0 - 0.5 K/uL   Basophils Relative 1 %   Basophils Absolute 0.1 0.0 - 0.1 K/uL   Immature Granulocytes 0 %   Abs Immature Granulocytes 0.03 0.00 - 0.07 K/uL    Comment: Performed at [place] [address] [place], [place] [address]  Brain natriuretic peptide     Status: Abnormal   Collection Time: 01/03/19  6:16 AM  Result Value Ref Range   B Natriuretic Peptide 698.1 (H) 0.0 - 100.0 pg/mL    Comment: Performed at [facility] [address], [place], [place] [address]  Troponin I - Once     Status: Abnormal   Collection Time: 01/03/19  6:16 AM  Result Value Ref Range   Troponin I 0.04 (HH) <0.03 ng/mL    Comment: CRITICAL RESULT CALLED TO, READ BACK BY AND VERIFIED WITH: [facility] RN @ [phone] 01/03/19 LEONARD,A Performed at [facility], [place] [address], [place], [place] [address]     Dg Chest Port 1 View  Result Date: 01/03/2019 CLINICAL DATA:  Shortness of breath. EXAM: PORTABLE CHEST 1 VIEW COMPARISON:  10/13/2017. FINDINGS: Cardiomegaly. Diffuse bilateral pulmonary infiltrates/edema. Small right pleural effusion. No pneumothorax. Old right rib fractures. IMPRESSION: Cardiomegaly with diffuse bilateral pulmonary infiltrates/edema and small right-sided pleural effusion. CHF could present in this fashion. Electronically Signed   By: Marcello Moores  Register   On: 01/03/2019 06:10    ROS: all other systems reviewed and are negative except as per HPI Blood pressure (!) (P) 174/106, pulse (P) 75, temperature (P) 98.5 F (36.9 C), temperature source (P) Oral, resp. rate (P) 15, height 5\' 9"  (1.753 m), weight 113.4 kg, SpO2 (P) 98 %. . GEN NAD,  on HD HEENT EOMI PERRL NECK + JVD PULM diffuse crackles bilaterally CV RRR no m/r/g ABD + abd wall edema with some distention EXT 1+ LE edema NEURO AAO x 3 nonfocal SKIN no rashes or lesions ACCESS LUE AVF + T/B  Assessment/Plan: 1 SOB/ hypoxia: in setting of missed HD.  NRB initially, now on 2L.  Has been leaving under EDW so will likely need to challenge and lower.  Urgent HD today, likely will need serial HD. 2 ESRD: MWF- left Friday 1 hr early.  Urgent HD today 3 Hypertension/volume: on nitropaste, expect to improve with HD.  EDW 115 but has been leaving 114.4.  Needs to have EDW lowered.   4. 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  On Auryxia 3 TID AC.   6.  Nutrition: renal diet and vitamins 7. HIV: per primary 8.  Dispo: admit for serial dialysis- discussed with EDP.   Shevelle Smither 01/03/2019, 11:00 AM

## 2019-01-03 NOTE — Progress Notes (Signed)
NEW ADMISSION NOTE New Admission Note:   Arrival Method: Patient arrived from ED. Mental Orientation: Alert and oriented x 4. Telemetry: N/A Assessment: Completed Skin:  Warm, dry and intact, boil noted under left abd fold, no drainage noted. IV:  Left posterior wrist, saline lock Pain:  Denies any pain. Tubes: N/A Safety Measures: Safety Fall Prevention Plan has been given, discussed and signed Admission: Completed [facility] Orientation: Patient has been orientated to the room, unit and staff.   Orders have been reviewed and implemented. Will continue to monitor the patient. Call light has been placed within reach and bed alarm has been activated.   Amaryllis Dyke, RN

## 2019-01-03 NOTE — H&P (Addendum)
Date: 01/03/2019               Patient Name:  Darrell Johnson MRN: [id number]  DOB: [birth year] Age / Sex: 53 y.o., male   PCP: Darrell Perna, NP         Medical Service: Internal Medicine Teaching Service         Attending Physician: Dr. Evette Doffing, Mallie Mussel, *    First Contact: Dr. Gilberto Better Pager: [phone]  Second Contact: Dr. Kathi Ludwig Pager: [phone]       After Hours (After 5p/  First Contact Pager: [phone]  weekends / holidays): Second Contact Pager: [phone]   Chief Complaint: Dyspnea  History of Present Illness: Darrell Johnson is a 53 yo M w/ PMH of ESRD (MWF), HTN, HLD, HIV (Viral load 48, CD4 670), HFpEF (grade 2 diastolic dysfx) presenting with shortness of breath. He was in his usual state of health until about 3am last night when he begant o have acute shortness of breath without inciting event. He states he used his red inhaler (albuterol?) without significant improvement and came to ED fore evaluation due to worsening symptoms. He mentions that he had shortened dialysis session (2-3 hrs instead of 5 hrs) on Friday due to being busy attending a friend's funeral. He states over the weekend he had significant sinus drainage with productive cough (clear sputum) but states this is chronic and he did not feel any different than his usual state of health when he went to bed last night. Denies any fevers, chills, chest pain, palpitations, light-headedness, sick contact, nausea, vomiting, diarrhea, abdominal pain, constipation, dysuria, urinary retention, frequency or worsening lower extremity edema.  In the ED, he was noted to be in AKI with Bun of 91 and Creatinine of 15.4. and chest x-ray with evidence of pulmonary edema. Nephrology was consulted who performed urgent hemodialysis with recommendation for admission for serial dialysis and dry weight challenge. IMTS was consulted for admission.  Meds: Current Meds  Medication Sig  . albuterol (PROVENTIL  HFA;VENTOLIN HFA) 108 (90 Base) MCG/ACT inhaler Inhale 2 puffs into the lungs every 4 (four) hours as needed for up to 30 days for wheezing or shortness of breath.  Marland Kitchen amLODipine (NORVASC) 10 MG tablet Take 1 tablet (10 mg total) by mouth daily.  . budesonide-formoterol (SYMBICORT) 160-4.5 MCG/ACT inhaler Inhale 2 puffs into the lungs 2 (two) times daily.  . carvedilol (COREG) 25 MG tablet Take 1 tablet (25 mg total) by mouth 2 (two) times daily with a meal.  . Dolutegravir-Rilpivirine (JULUCA) 50-25 MG TABS Take 1 tablet by mouth daily with supper.  . doxazosin (CARDURA) 8 MG tablet Take 1 tablet (8 mg total) by mouth at bedtime.  . ferric citrate (AURYXIA) 1 GM 210 MG(Fe) tablet Take 630 mg by mouth 3 (three) times daily with meals. And 2 tablets (420mg ) with a snack  . furosemide (LASIX) 40 MG tablet TAKE 1 TABLET (40 MG TOTAL) BY MOUTH DAILY AS NEEDED FOR FLUID.  Marland Kitchen ipratropium-albuterol (DUONEB) 0.5-2.5 (3) MG/3ML SOLN Take 3 mLs by nebulization every 6 (six) hours as needed.  Marland Kitchen lisinopril (ZESTRIL) 10 MG tablet Take 10 mg by mouth daily.  Marland Kitchen loratadine (CLARITIN) 10 MG tablet Take 1 tablet (10 mg total) by mouth daily. (Patient taking differently: Take 10 mg by mouth daily as needed for allergies. )  . nicotine (NICOTROL) 10 MG inhaler Inhale 10 mg into the lungs as directed. Begin 4 weeks before your quit smoking day (10  cartridges per day) inhale multiple small puffs into mouth to control urges  . pravastatin (PRAVACHOL) 40 MG tablet TAKE 1 TABLET BY MOUTH EVERYDAY AT BEDTIME (Patient taking differently: Take 40 mg by mouth daily. )  . zidovudine (RETROVIR) 300 MG tablet Take 1 tablet (300 mg total) by mouth daily.   Allergies: Allergies as of 01/03/2019  . (No Known Allergies)   Past Medical History:  Diagnosis Date  . Anemia   . Anxiety   . Arthritis    "knees" (04/13/2017)  . Asthma   . CHF (congestive heart failure) ([place])   . ESRD (end stage renal disease) on dialysis [facility])     "MWF; [facility]" (04/13/2017)  . Gout, unspecified 08/13/2009   Qualifier: Diagnosis of  By: Redmond Pulling  MD, Mateo Flow    . H1N1 influenza    March 2016  . History of blood transfusion 02/2017   "related to OR"  . HIV infection ([place]) 1980's   on ART therapy since, followed by ID clinic, complicated  by neuropathy  . Hyperlipidemia    hypertrygliceridemia determined ti be secondary to ART therpay  . HYPERTENSION 05/08/2006  . Pneumonia    "once; years ago" (04/13/2017)  . Rib fractures 01/2009  . Seizures ([place])    last seizure was in the 1990s, pt has family history of seizures; "probably related to alcohol" (04/13/2017)  . 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 Syphilis 1997   history of syphilis 1997  . Tobacco abuse     Family History:  Father had occupational-related lung disease Family History  Problem Relation Age of Onset  . Cancer Mother   . Hypertension Mother   . COPD Father   . Hypertension Father   . Diabetes Sister   . Hypertension Sister   . Diabetes Brother   . Hypertension Brother   . Stroke Neg Hx    Social History:  Lives with wife at home. Able to perform ADLs, IADLs without difficulty. Currently trying to limit tobacco use down to 2 cigarettes daily. Denies any alcohol or illicit substance use.  Social History   Tobacco Use  . Smoking status: Current Some Day Smoker    Packs/day: 0.10    Years: 25.00    Pack years: 2.50    Types: Cigarettes    Last attempt to quit: 10/19/2017    Years since quitting: 1.2  . Smokeless tobacco: Never Used  . Tobacco comment: "quitting"  Substance Use Topics  . Alcohol use: No    Alcohol/week: 0.0 standard drinks    Comment: 04/13/2017 "quit ~ 2014"  . Drug use: No    Review of Systems: A complete ROS was negative except as per HPI.   Physical Exam: Blood pressure (!)  164/91, pulse 74, temperature 98.7 F (37.1 C), resp. rate 18, height 5\' 9"  (1.753 m), weight 115 kg, SpO2 98 %.  Gen: Well-developed, well nourished, NAD HEENT: EOMI, MMM Neck: supple, ROM intact, no JVD CV: RRR, S1, S2 normal, No rubs, no murmurs, no gallops Pulm: Distant breath sounds, No rales, no wheezes Abd: Soft, BS+, NTND, No rebound, no guarding MSK: ROM intact, normal joints. Peripheral pulses intact, No peripheral edema Skin: Dry, Warm, normal turgor  EKG: personally reviewed my interpretation is normal sinus, normal axis, prolonged QT, questionable ST elevation in V2,V3,V4 but no significant changes from prior  EKG.   CXR: personally reviewed my interpretation is poor penetration, rotated film, poor inspiratory effort, flattened diaphragm, right pleural effusion, significant pulmonary infiltrates bilaterally.  Assessment & Plan by Problem: Principal Problem:   ESRD on dialysis [facility])   Mr.Joles is a 53 yo M w/ PMH of ESRD (MWF), HTN, HLD, HIV (Viral load 48, CD4 670), HFpEF (grade 2 diastolic dysfx) presenting with dyspnea 2/2 pulmonary edema after a short dialysis session on Friday. On our exam, he appears to be euvolemic but have received urgent dialysis since presenting to the ED. Per nephrology, requesting admission to challenge dry weight with serial dialysis and continue volume offload. Currently oxygen saturation stable on RA. Will admit for observation.  Dyspnea 2/2 pulmonary edema Chest X-ray: pulmonary edema w/ right sided pleural effusion. Short dialysis session on 6/12. 94% on RA in ED at presentation. - Keep O2 sat >88 - Serial dialysis per nephro  End Stage Renal Disease K 4.0. Still making some urine. On MWF schedule. Currently undergoing transplant work-up at [facility]: serial dialysis to new dry weight - Avoid nephrotoxic meds  COPD vs Asthma 25 year pack year smoking hx. Recently decreased to 2 cigarettes daily. Unable to find PFTs on  file but on Symbicort maintenace inhaler regimen at home. - Monitor o2 sat - C/w home meds: Symbicort 2 puffs BID (inpatient alternative [facility])  Chronic diastolic heart failure Admit BNP 698.1 (lower than prior BNPs). Admit weight 115kg (known dry weight 114) TTE 03/2017: EF 78-58%, Grade 2 diastolic dysfunction, Mild mitral regurg, moderate pulmonary HTN - Strict I&Os - Daily weights - C/w home meds: carvedilol 25mg  BID, pravstatin 40mg  daily  Troponemia 2/2 ESRD Admit troponin 0.04. Not endorsing active chest pain. EKG with possible ST changes but comparison w/ prior EKG shows not an acute change. Most likely unable to clear troponin due to ESRD - Monitor for chest pain  Macrocytic Anemia hgb 10.9, MCV 102.8, May have dilutional effect in setting of hypervolemia. Not receiving EPO. Baseline hemoglobin 14.3 from year ago. No obvious sign of bleed. On iron supplementation as outpatient - C/w home meds: ferric citrate 630mg  TID - Consider checking B12, Folate as outpatient at dry weight  HTN Admit bp 164/91, currently volume up - Hold home lisinopril 10mg  daily - C/w home meds: amloidipine 10mg   HIV Viral load 48, CD4 670 - c/w home meds: zidovudine 300mg  daily, dolutegravir-rilpivirine 50-25mg  daily  Hx of BPH - c/w home meds: doxazosin 8mg  qhs  Hx of tobacco use Currently 2 cigarettes daily smoker - nicotine patch 7mg   DVT prophx: subqheparin Diet: Renal w/ fluid restriction Code: Full  Dispo: Admit patient to Observation with expected length of stay less than 2 midnights.  Signed: Mosetta Anis, MD 01/03/2019, 4:01 PM  Pager: [phone]    Internal Medicine Teaching Service Attending:   I saw and examined the patient. I reviewed the resident's note and I agree with the resident's findings and plan as documented in the resident's note.  53 year old man living with ESRD on HD through a right AV fistula for over three years, here with pulmonary edema and volume  overload due to under-dialysis. He only missed part of one session, but combined with high fluid intake has led to pulmonary edema and dyspnea. He has responded well to overnight HD. He seems to be otherwise compliant, does not have many admissions, is undergoing transplant evaluation at [facility]. I agree with the plan to manage his volume overload with  serial HD sessions on admission. Likely can get him back into his regular HD schedule quickly.  Lalla Brothers, MD FACP

## 2019-01-03 NOTE — Progress Notes (Signed)
Renal Navigator notified OP HD clinic/[facility] of patient's negative COVID 19 rapid test result and plan for admission to provide continuity of care and safety.  Alphonzo Cruise, [place] Renal Navigator [phone]

## 2019-01-03 NOTE — ED Triage Notes (Signed)
The pt arrived by gems from home sob tonight his inhalers are not helping.  He arrived on a non-rebreather  No pain anywhere dialysis pt that is duje for dialysis today  Dialysis not  Completed on friday

## 2019-01-03 NOTE — ED Notes (Signed)
Pt oxygen saturation dropped to 94%, pt stating he feels SHOB.  Placed pt on 2L [place] for comfort and to ease work of breathing.

## 2019-01-03 NOTE — ED Notes (Addendum)
Pt removed from non-rebreather and placed on RA.  Pt oxygen saturation 100% at this time.  Will continue to monitor.

## 2019-01-03 NOTE — ED Provider Notes (Signed)
[facility] EMERGENCY DEPARTMENT Provider Note   CSN: [id number] Arrival date & time: 01/03/19  4970     History   Chief Complaint Chief Complaint  Patient presents with  . Shortness of Breath    HPI Darrell Johnson is a 53 y.o. male.     Patient is a 53 year old male with extensive past medical history including end-stage renal disease on hemodialysis, HIV disease, CHF.  He presents today for evaluation of shortness of breath.  This began earlier this evening and is rapidly worsening.  Patient is due for dialysis today, and according to EMS missed dialysis on Friday.  He denies fevers or chills.  He denies productive cough.  The history is provided by the patient.  Shortness of Breath Severity:  Moderate Onset quality:  Sudden Timing:  Constant Progression:  Worsening Chronicity:  Recurrent Relieved by:  Nothing Worsened by:  Nothing Ineffective treatments:  Inhaler Associated symptoms: no cough, no fever and no sputum production     Past Medical History:  Diagnosis Date  . Anemia   . Anxiety   . Arthritis    "knees" (04/13/2017)  . Asthma   . CHF (congestive heart failure) ([place])   . ESRD (end stage renal disease) on dialysis [facility])    "MWF; [facility]" (04/13/2017)  . Gout, unspecified 08/13/2009   Qualifier: Diagnosis of  By: Redmond Pulling  MD, Mateo Flow    . H1N1 influenza    March 2016  . History of blood transfusion 02/2017   "related to OR"  . HIV infection ([place]) 1980's   on ART therapy since, followed by ID clinic, complicated  by neuropathy  . Hyperlipidemia    hypertrygliceridemia determined ti be secondary to ART therpay  . HYPERTENSION 05/08/2006  . Pneumonia    "once; years ago" (04/13/2017)  . Rib fractures 01/2009  . Seizures ([place])    last seizure was in the 1990s, pt has family history of seizures; "probably related to alcohol" (04/13/2017)  . 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 Syphilis 1997   history of syphilis 1997  . Tobacco abuse     Patient Active Problem List   Diagnosis Date Noted  . Hypotension due to hypovolemia 10/13/2017  . Viral gastroenteritis 10/13/2017  . Moderate mitral regurgitation   . Acute exacerbation of CHF (congestive heart failure) ([place]) 04/12/2017  . Acute respiratory failure with hypoxia ([place]) 03/29/2017  . Scrotal abscess 03/09/2017  . Trigger finger of right hand 09/11/2016  . Abscess of groin, right 09/04/2016  . Hyperlipidemia 03/31/2016  . Anxiety state 03/11/2016  . Lumbar radiculopathy 01/28/2016  . Seasonal allergies   . Healthcare maintenance 05/03/2015  . Anemia in chronic kidney disease 08/30/2014  . ESRD on dialysis ([place]) 12/27/2013  . Drug noncompliance 11/07/2013  . History of syphilis 11/07/2013  . Arthritis 09/09/2013  . Tobacco abuse 09/07/2013  . Flash pulmonary edema ([place]) 06/11/2013  . Chronic pain disorder 04/28/2013  . HYPERTRIGLYCERIDEMIA 11/01/2009  . Gout 08/13/2009  . ERECTILE DYSFUNCTION 06/08/2008  . HIV disease ([place]) 05/08/2006  . PERIPHERAL NEUROPATHY 05/08/2006  . Essential hypertension 05/08/2006  . SEIZURE DISORDER 05/08/2006    Past Surgical History:  Procedure Laterality Date  . AV FISTULA PLACEMENT Right 12/02/2013   Procedure: ARTERIOVENOUS (AV) FISTULA CREATION;  Surgeon: Rosetta Posner, MD;  Location: [facility];  Service: Vascular;  Laterality: Right;  . INCISION AND DRAINAGE ABSCESS Right 03/09/2017   Procedure: INCISION AND DRAINAGE Right Scrotal Abscess;  Surgeon: Franchot Gallo, MD;  Location: [facility];  Service: Urology;  Laterality: Right;  . THROMBECTOMY Right ~ 2016   "AV fistula clotted off"        Home Medications    Prior to Admission medications   Medication Sig Start Date End Date Taking? Authorizing Provider  albuterol (PROVENTIL HFA;VENTOLIN HFA) 108 (90 Base)  MCG/ACT inhaler Inhale 2 puffs into the lungs every 4 (four) hours as needed for up to 30 days for wheezing or shortness of breath. 09/21/18 10/21/18  Kerin Perna, NP  amLODipine (NORVASC) 10 MG tablet Take 1 tablet (10 mg total) by mouth daily. 09/21/18   Kerin Perna, NP  budesonide-formoterol (SYMBICORT) 160-4.5 MCG/ACT inhaler Inhale 2 puffs into the lungs 2 (two) times daily. 09/21/18   Kerin Perna, NP  carvedilol (COREG) 25 MG tablet Take 1 tablet (25 mg total) by mouth 2 (two) times daily with a meal. 09/21/18   Kerin Perna, NP  Dolutegravir-Rilpivirine (JULUCA) 50-25 MG TABS Take 1 tablet by mouth daily with supper. 12/29/18   Kuppelweiser, Cassie L, RPH-CPP  doxazosin (CARDURA) 8 MG tablet Take 1 tablet (8 mg total) by mouth at bedtime. 09/21/18   Kerin Perna, NP  furosemide (LASIX) 40 MG tablet TAKE 1 TABLET (40 MG TOTAL) BY MOUTH DAILY AS NEEDED FOR FLUID. 12/14/18   Kerin Perna, NP  ipratropium-albuterol (DUONEB) 0.5-2.5 (3) MG/3ML SOLN Take 3 mLs by nebulization every 6 (six) hours as needed. 09/21/18   Kerin Perna, NP  loratadine (CLARITIN) 10 MG tablet Take 1 tablet (10 mg total) by mouth daily. 09/21/18   Kerin Perna, NP  nicotine (NICODERM CQ) 14 mg/24hr patch Place 1 patch (14 mg total) onto the skin daily. 09/21/18   Kerin Perna, NP  nicotine (NICODERM CQ) 21 mg/24hr patch Place 1 patch (21 mg total) onto the skin daily. 09/21/18   Kerin Perna, NP  nicotine (NICODERM CQ) 7 mg/24hr patch Place 1 patch (7 mg total) onto the skin daily. 09/21/18   Kerin Perna, NP  pravastatin (PRAVACHOL) 40 MG tablet TAKE 1 TABLET BY MOUTH EVERYDAY AT BEDTIME 12/16/18   Kerin Perna, NP  zidovudine (RETROVIR) 300 MG tablet Take 1 tablet (300 mg total) by mouth daily. 12/29/18   Kuppelweiser, Cassie L, RPH-CPP    Family History Family History  Problem Relation Age of Onset  . Cancer Mother   . Hypertension Mother   . COPD Father    . Hypertension Father   . Diabetes Sister   . Hypertension Sister   . Diabetes Brother   . Hypertension Brother   . Stroke Neg Hx     Social History Social History   Tobacco Use  . Smoking status: Current Some Day Smoker    Packs/day: 0.10    Years: 25.00    Pack years: 2.50    Types: Cigarettes    Last attempt to quit: 10/19/2017    Years since quitting: 1.2  . Smokeless tobacco: Never Used  . Tobacco comment: "quitting"  Substance Use Topics  . Alcohol use: No    Alcohol/week: 0.0 standard drinks    Comment: 04/13/2017 "quit ~ 2014"  . Drug use: No     Allergies   Patient has no known allergies.   Review of Systems Review of Systems  Constitutional: Negative  for fever.  Respiratory: Positive for shortness of breath. Negative for cough and sputum production.   All other systems reviewed and are negative.    Physical Exam Updated Vital Signs BP (!) 196/98 (BP Location: Right Arm)   Pulse 81   Temp 98 F (36.7 C) (Oral)   Resp (!) 23   Ht 5\' 9"  (1.753 m)   Wt 113.4 kg   SpO2 100%   BMI 36.92 kg/m   Physical Exam Vitals signs and nursing note reviewed.  Constitutional:      General: He is not in acute distress.    Appearance: He is well-developed. He is not diaphoretic.  HENT:     Head: Normocephalic and atraumatic.  Neck:     Musculoskeletal: Normal range of motion and neck supple.  Cardiovascular:     Rate and Rhythm: Normal rate and regular rhythm.     Heart sounds: No murmur. No friction rub.  Pulmonary:     Effort: Pulmonary effort is normal. No respiratory distress.     Breath sounds: Examination of the right-middle field reveals rales. Examination of the left-middle field reveals rales. Examination of the right-lower field reveals rales. Examination of the left-lower field reveals rales. Rales present. No wheezing.  Abdominal:     General: Bowel sounds are normal. There is no distension.     Palpations: Abdomen is soft.     Tenderness: There  is no abdominal tenderness.  Musculoskeletal: Normal range of motion.     Right lower leg: Edema present.     Left lower leg: Edema present.     Comments: There is trace edema of both lower extremities.  Skin:    General: Skin is warm and dry.  Neurological:     Mental Status: He is alert and oriented to person, place, and time.     Coordination: Coordination normal.      ED Treatments / Results  Labs (all labs ordered are listed, but only abnormal results are displayed) Labs Reviewed  SARS CORONAVIRUS 2 (HOSPITAL ORDER, [facility])  COMPREHENSIVE METABOLIC PANEL  CBC WITH DIFFERENTIAL/PLATELET  BRAIN NATRIURETIC PEPTIDE  TROPONIN I    EKG EKG Interpretation  Date/Time:  Monday January 03 2019 05:19:35 EDT Ventricular Rate:  81 PR Interval:    QRS Duration: 103 QT Interval:  423 QTC Calculation: 491 R Axis:   89 Text Interpretation:  Sinus rhythm Probable left atrial enlargement RSR' in V1 or V2, right VCD or RVH ST elev, probable normal early repol pattern Borderline prolonged QT interval Confirmed by Veryl Speak [phone]) on 01/03/2019 5:25:47 AM   Radiology No results found.  Procedures Procedures (including critical care time)  Medications Ordered in ED Medications  furosemide (LASIX) injection 40 mg (has no administration in time range)  acetaminophen (TYLENOL) tablet 1,000 mg (has no administration in time range)     Initial Impression / Assessment and Plan / ED Course  I have reviewed the triage vital signs and the nursing notes.  Pertinent labs & imaging results that were available during my care of the patient were reviewed by me and considered in my medical decision making (see chart for details).  Patient's chest x-ray consistent with volume overload.  Patient missed the majority of his dialysis on Friday.  Patient's laboratory studies are pending.  Nephrology will be consulted as this patient will likely require emergent  dialysis.  He continues on oxygen by nonrebreather.  Patient was given an inch of nitroglycerin paste.  CRITICAL CARE Performed by: Veryl Speak Total critical care time: 35 minutes Critical care time was exclusive of separately billable procedures and treating other patients. Critical care was necessary to treat or prevent imminent or life-threatening deterioration. Critical care was time spent personally by me on the following activities: development of treatment plan with patient and/or surrogate as well as nursing, discussions with consultants, evaluation of patient's response to treatment, examination of patient, obtaining history from patient or surrogate, ordering and performing treatments and interventions, ordering and review of laboratory studies, ordering and review of radiographic studies, pulse oximetry and re-evaluation of patient's condition.   Final Clinical Impressions(s) / ED Diagnoses   Final diagnoses:  None    ED Discharge Orders    None       Veryl Speak, MD 01/03/19 [phone]

## 2019-01-03 NOTE — ED Provider Notes (Addendum)
53 yo M with a chief complaint of shortness of breath.  Arrived on nonrebreather.  Missed dialysis on Friday supposed to go today.  I received the patient in signout from Dr. Stark Jock, briefly the patient is clinically fluid overloaded plan is to discuss with nephrology.  Lab work has returned without hyperkalemia.  He has a metabolic acidosis with anion gap is likely secondary to uremia.  On reassessment the patient is feeling better after an inch of Nitropaste and being on nonrebreather.  I discussed the case with nephrology who will assess for urgent dialysis.  Nephrology feels that the patient is way over his dry weight and feels that he needs admission for repeat dialysis tomorrow.  Patient is unassigned will discuss with the internal medicine teaching service. Deno Etienne, DO 01/03/19 [place], [place], DO 01/03/19 1525

## 2019-01-04 DIAGNOSIS — N400 Enlarged prostate without lower urinary tract symptoms: Secondary | ICD-10-CM | POA: Diagnosis not present

## 2019-01-04 DIAGNOSIS — Z720 Tobacco use: Secondary | ICD-10-CM

## 2019-01-04 DIAGNOSIS — I272 Pulmonary hypertension, unspecified: Secondary | ICD-10-CM

## 2019-01-04 DIAGNOSIS — I340 Nonrheumatic mitral (valve) insufficiency: Secondary | ICD-10-CM

## 2019-01-04 DIAGNOSIS — Z7951 Long term (current) use of inhaled steroids: Secondary | ICD-10-CM

## 2019-01-04 DIAGNOSIS — E785 Hyperlipidemia, unspecified: Secondary | ICD-10-CM

## 2019-01-04 DIAGNOSIS — N186 End stage renal disease: Secondary | ICD-10-CM

## 2019-01-04 DIAGNOSIS — Z79899 Other long term (current) drug therapy: Secondary | ICD-10-CM

## 2019-01-04 DIAGNOSIS — Z1159 Encounter for screening for other viral diseases: Secondary | ICD-10-CM | POA: Diagnosis not present

## 2019-01-04 DIAGNOSIS — N2581 Secondary hyperparathyroidism of renal origin: Secondary | ICD-10-CM | POA: Diagnosis not present

## 2019-01-04 DIAGNOSIS — R0602 Shortness of breath: Secondary | ICD-10-CM | POA: Diagnosis not present

## 2019-01-04 DIAGNOSIS — I132 Hypertensive heart and chronic kidney disease with heart failure and with stage 5 chronic kidney disease, or end stage renal disease: Secondary | ICD-10-CM

## 2019-01-04 DIAGNOSIS — Z992 Dependence on renal dialysis: Secondary | ICD-10-CM

## 2019-01-04 DIAGNOSIS — I5032 Chronic diastolic (congestive) heart failure: Secondary | ICD-10-CM

## 2019-01-04 DIAGNOSIS — Z21 Asymptomatic human immunodeficiency virus [HIV] infection status: Secondary | ICD-10-CM

## 2019-01-04 DIAGNOSIS — J181 Lobar pneumonia, unspecified organism: Secondary | ICD-10-CM

## 2019-01-04 DIAGNOSIS — B20 Human immunodeficiency virus [HIV] disease: Secondary | ICD-10-CM | POA: Diagnosis not present

## 2019-01-04 DIAGNOSIS — D539 Nutritional anemia, unspecified: Secondary | ICD-10-CM

## 2019-01-04 DIAGNOSIS — R7989 Other specified abnormal findings of blood chemistry: Secondary | ICD-10-CM

## 2019-01-04 DIAGNOSIS — I120 Hypertensive chronic kidney disease with stage 5 chronic kidney disease or end stage renal disease: Secondary | ICD-10-CM | POA: Diagnosis not present

## 2019-01-04 DIAGNOSIS — J449 Chronic obstructive pulmonary disease, unspecified: Secondary | ICD-10-CM | POA: Diagnosis not present

## 2019-01-04 DIAGNOSIS — D631 Anemia in chronic kidney disease: Secondary | ICD-10-CM | POA: Diagnosis not present

## 2019-01-04 DIAGNOSIS — E8779 Other fluid overload: Secondary | ICD-10-CM | POA: Diagnosis not present

## 2019-01-04 LAB — CBC
HCT: 31.4 % — ABNORMAL LOW (ref 39.0–52.0)
Hemoglobin: 10.4 g/dL — ABNORMAL LOW (ref 13.0–17.0)
MCH: 33.8 pg (ref 26.0–34.0)
MCHC: 33.1 g/dL (ref 30.0–36.0)
MCV: 101.9 fL — ABNORMAL HIGH (ref 80.0–100.0)
Platelets: 174 10*3/uL (ref 150–400)
RBC: 3.08 MIL/uL — ABNORMAL LOW (ref 4.22–5.81)
RDW: 14.6 % (ref 11.5–15.5)
WBC: 6.4 10*3/uL (ref 4.0–10.5)
nRBC: 0 % (ref 0.0–0.2)

## 2019-01-04 LAB — BASIC METABOLIC PANEL
Anion gap: 15 (ref 5–15)
BUN: 48 mg/dL — ABNORMAL HIGH (ref 6–20)
CO2: 27 mmol/L (ref 22–32)
Calcium: 8.4 mg/dL — ABNORMAL LOW (ref 8.9–10.3)
Chloride: 93 mmol/L — ABNORMAL LOW (ref 98–111)
Creatinine, Ser: 10.33 mg/dL — ABNORMAL HIGH (ref 0.61–1.24)
GFR calc Af Amer: 6 mL/min — ABNORMAL LOW (ref >60)
GFR calc non Af Amer: 5 mL/min — ABNORMAL LOW (ref >60)
Glucose, Bld: 87 mg/dL (ref 70–99)
Potassium: 3.8 mmol/L (ref 3.5–5.1)
Sodium: 135 mmol/L (ref 135–145)

## 2019-01-04 NOTE — Progress Notes (Signed)
Darrell Johnson to be discharged Home per MD order. Discussed prescriptions and follow up appointments with the patient. Prescriptions given to patient; medication list explained in detail. Patient verbalized understanding.  Skin clean, dry and intact without evidence of skin break down, no evidence of skin tears noted. IV catheter discontinued intact. Site without signs and symptoms of complications. Dressing and pressure applied. Pt denies pain at the site currently. No complaints noted.  Patient free of lines, drains, and wounds.   An After Visit Summary (AVS) was printed and given to the patient. Patient escorted via wheelchair, and discharged home via private auto.  Shela Commons, RN

## 2019-01-04 NOTE — Procedures (Signed)
Patient seen and examined on Hemodialysis. BP (!) 190/78   Pulse 83   Temp 98.6 F (37 C) (Oral)   Resp 18   Ht 5\' 9"  (1.753 m)   Wt 115.7 kg   SpO2 94%   BMI 37.67 kg/m   QB 400 mL/ min via AVF UF goal 2L   Tolerating treatment without complaints at this time.   Madelon Lips MD [facility] pgr [phone] 9:36 AM

## 2019-01-04 NOTE — Progress Notes (Addendum)
   Subjective:  Darrell Johnson is a 53 yo M w/ PMH of ESRD (MWF), HTN, HLD, HIV (Viral load 48, CD4 670), HFpEF (grade 2 diastolic dysfx) admit for pulmonary edema on hospital day 1  Darrell Johnson was examined and evaluated at bedside this AM. He states significant improvement in his dyspnea. Denies any fevers, chills, chest pain, palpitations, cough. He mentions he was told by his nephrologist that he can be discharged home today. No other acute complaints.  Objective:  Vital signs in last 24 hours: Vitals:   01/03/19 1400 01/03/19 1421 01/03/19 1639 01/03/19 1642  BP: (!) 156/91 (!) 164/91  (!) 177/98  Pulse: 73 74  78  Resp: 19 18  18   Temp:  98.7 F (37.1 C)  98.7 F (37.1 C)  TempSrc:    Oral  SpO2:    100%  Weight:  115 kg 116.4 kg   Height:   5\' 9"  (1.753 m)    Gen: Well-developed, well nourished, NAD CV: RRR, S1, S2 normal Pulm: CTAB, No rales, no wheezes Abd: Soft, BS+, NTND, No rebound, no guarding Extm: ROM intact, Peripheral pulses intact, No peripheral edema  Assessment/Plan:  Principal Problem:   ESRD on dialysis ([place]) Active Problems:   HIV disease ([place])   ESRF (end stage renal failure) ([facility])  Darrell Johnson is a 53 yo M w/ PMH of ESRD (MWF), HTN, HLD, HIV (Viral load 48, CD4 670), HFpEF (grade 2 diastolic dysfx) presenting with dyspnea 2/2 pulmonary edema. Currently undergoing 2nd serial dialysis session. Appears euvolemic on exam. Subjective dyspnea significantly improved. Current weight 115.7kg which was suspected to be above dry weight but per nephro safe to be discharged after dialysis today to return to regularly scheduled dialysis session tomorrow.  Dyspnea 2/2 pulmonary edema Dyspnea improved. O2 sat 94 on Room air. Chest X-ray: pulmonary edema w/ right sided pleural effusion. Short dialysis session on 6/12. 94% on RA in ED at presentation. - Keep O2 sat >88 - Discharge after HD per nephro  End Stage Renal Disease BUN 91->48, Creatinine 15.43->10.33, K  3.8. On MWF schedule. Currently undergoing transplant work-up at [facility]: serial dialysis to new dry weight - Avoid nephrotoxic meds   DVT prophx: subqheparin Diet: Renal w/ fluid restriction Code: Full  Dispo: Anticipated discharge in approximately today.   Mosetta Anis, MD 01/04/2019, 7:24 AM Pager: [phone]

## 2019-01-04 NOTE — Progress Notes (Signed)
  [facility] Progress Note   Assessment/ Plan:    Dialyzes at [facility]  4 hr 30 min F200 dialyzer BFR 475  EDW115 kg HD Bath 2K 2 Ca  UF profile 2 AVF  Parsabiv 10 q rx Heparin 6000 bolus with 3000 mid-run Calcitriol 3.75 q rx  Assessment/Plan: 1 SOB/ hypoxia: in setting of missed HD.  NRB initially, now on 2L.  Has been leaving under EDW so will likely need to challenge and lower.  Much better after HD 2 ESRD: MWF- left Friday 1 hr early.  HD yesterday and today 3 Hypertension/volume: on nitropaste, expect to improve with HD.  EDW 115 but has been leaving 114.4.  Needs to have EDW lowered.  Will check post-weight  4. 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  On Auryxia 3 TID AC.   6.  Nutrition: renal diet and vitamins 7. HIV: per primary 8.  Dispo: OK from renal perspective to go after HD today  Subjective:    Feeling better.  For HD today too.  Eager for discharge.   Objective:   BP (!) 191/73   Pulse 74   Temp 98.6 F (37 C) (Oral)   Resp 18   Ht 5\' 9"  (1.753 m)   Wt 115.7 kg   SpO2 94%   BMI 37.67 kg/m   Physical Exam: GEN NAD, on HD HEENT EOMI PERRL NECK no JVD PULM bilaterally clear, breathing much improved CV RRR no m/r/g ABD resolved abd wall edema with some distention EXT no LE edema NEURO AAO x 3 nonfocal SKIN no rashes or lesions ACCESS LUE AVF + T/B  Labs: BMET Recent Labs  Lab 01/03/19 0616 01/04/19 0442  NA 139 135  K 4.0 3.8  CL 93* 93*  CO2 27 27  GLUCOSE 88 87  BUN 91* 48*  CREATININE 15.43* 10.33*  CALCIUM 8.8* 8.4*   CBC Recent Labs  Lab 01/03/19 0616 01/04/19 0442  WBC 9.3 6.4  NEUTROABS 7.3  --   HGB 10.9* 10.4*  HCT 33.1* 31.4*  MCV 102.8* 101.9*  PLT 177 174    @IMGRELPRIORS @ Medications:    . amLODipine  10 mg Oral Daily  . carvedilol  25 mg Oral BID WC  . Chlorhexidine Gluconate Cloth  6 each Topical Q0600   . dolutegravir  50 mg Oral Q supper   And  . rilpivirine  25 mg Oral Q supper  . doxazosin  8 mg Oral QHS  . ferric citrate  630 mg Oral TID WC  . heparin  5,000 Units Subcutaneous Q8H  . mometasone-formoterol  2 puff Inhalation BID  . pravastatin  40 mg Oral Daily  . zidovudine  300 mg Oral Daily     Madelon Lips, MD [facility] pgr [phone] 01/04/2019, 9:29 AM

## 2019-01-04 NOTE — Care Management Obs Status (Signed)
[facility] NOTIFICATION   Patient Details  Name: Darrell Johnson MRN: [id number] Date of Birth: 1966-01-27   Medicare Observation Status Notification Given:  Yes    Zenon Mayo, RN 01/04/2019, 2:01 PM

## 2019-01-06 ENCOUNTER — Other Ambulatory Visit: Payer: Self-pay

## 2019-01-06 ENCOUNTER — Other Ambulatory Visit: Payer: Medicare Other

## 2019-01-06 DIAGNOSIS — N186 End stage renal disease: Secondary | ICD-10-CM | POA: Diagnosis not present

## 2019-01-06 DIAGNOSIS — N2581 Secondary hyperparathyroidism of renal origin: Secondary | ICD-10-CM | POA: Diagnosis not present

## 2019-01-06 DIAGNOSIS — B20 Human immunodeficiency virus [HIV] disease: Secondary | ICD-10-CM | POA: Diagnosis not present

## 2019-01-06 DIAGNOSIS — D631 Anemia in chronic kidney disease: Secondary | ICD-10-CM | POA: Diagnosis not present

## 2019-01-07 DIAGNOSIS — N2581 Secondary hyperparathyroidism of renal origin: Secondary | ICD-10-CM | POA: Diagnosis not present

## 2019-01-07 DIAGNOSIS — N186 End stage renal disease: Secondary | ICD-10-CM | POA: Diagnosis not present

## 2019-01-07 DIAGNOSIS — D631 Anemia in chronic kidney disease: Secondary | ICD-10-CM | POA: Diagnosis not present

## 2019-01-07 LAB — T-HELPER CELL (CD4) - (RCID CLINIC ONLY)
CD4 % Helper T Cell: 44 % (ref 33–65)
CD4 T Cell Abs: 428 /uL (ref 400–1790)

## 2019-01-07 NOTE — Discharge Summary (Signed)
Name: Darrell Johnson MRN: [id number] DOB: 1966-02-01 53 y.o. PCP: Kerin Perna, NP  Date of Admission: 01/03/2019  5:17 AM Date of Discharge: 01/04/2019 Attending Physician: Lalla Brothers MD  Discharge Diagnosis: 1. Pulmonary Edema 2. End Stage Renal Disease  Discharge Medications: Allergies as of 01/04/2019   No Known Allergies     Medication List    TAKE these medications   albuterol 108 (90 Base) MCG/ACT inhaler Commonly known as: VENTOLIN HFA Inhale 2 puffs into the lungs every 4 (four) hours as needed for up to 30 days for wheezing or shortness of breath.   amLODipine 10 MG tablet Commonly known as: NORVASC Take 1 tablet (10 mg total) by mouth daily.   budesonide-formoterol 160-4.5 MCG/ACT inhaler Commonly known as: SYMBICORT Inhale 2 puffs into the lungs 2 (two) times daily.   carvedilol 25 MG tablet Commonly known as: COREG Take 1 tablet (25 mg total) by mouth 2 (two) times daily with a meal.   Dolutegravir-Rilpivirine 50-25 MG Tabs Commonly known as: Juluca Take 1 tablet by mouth daily with supper.   doxazosin 8 MG tablet Commonly known as: CARDURA Take 1 tablet (8 mg total) by mouth at bedtime.   ferric citrate 1 GM 210 MG(Fe) tablet Commonly known as: AURYXIA Take 630 mg by mouth 3 (three) times daily with meals. And 2 tablets (420mg ) with a snack   furosemide 40 MG tablet Commonly known as: LASIX TAKE 1 TABLET (40 MG TOTAL) BY MOUTH DAILY AS NEEDED FOR FLUID.   ipratropium-albuterol 0.5-2.5 (3) MG/3ML Soln Commonly known as: DUONEB Take 3 mLs by nebulization every 6 (six) hours as needed.   lisinopril 10 MG tablet Commonly known as: ZESTRIL Take 10 mg by mouth daily.   loratadine 10 MG tablet Commonly known as: CLARITIN Take 1 tablet (10 mg total) by mouth daily. What changed:   when to take this  reasons to take this   nicotine 7 mg/24hr patch Commonly known as: Nicoderm CQ Place 1 patch (7 mg total) onto the skin daily.  What changed: Another medication with the same name was removed. Continue taking this medication, and follow the directions you see here.   pravastatin 40 MG tablet Commonly known as: PRAVACHOL TAKE 1 TABLET BY MOUTH EVERYDAY AT BEDTIME What changed: See the new instructions.   zidovudine 300 MG tablet Commonly known as: RETROVIR Take 1 tablet (300 mg total) by mouth daily.       Disposition and follow-up:   Darrell Johnson was discharged from [facility] in Stable condition.  At the hospital follow up visit please address:  1. Pulmonary Edema:   - Noted to have pulmonary edema 2/2 inadequate dialysis - Please evaluate respiratory status and ensure he goes to his scheduled dialysis sessions   2. End Stage Renal Disease: - Noted to have Bun 91, Creatinine 15.43 with evidence of anasarca 2/2 inadequate dialysis - Please ensure he has follow up with nephrology and regularly attend his dialysis sessions - Discharge weight 115.7kg  2.  Labs / imaging needed at time of follow-up: N/A  3.  Pending labs/ test needing follow-up: N/A  Follow-up Appointments:   Hospital Course by problem list: 1. Pulmonary Edema: Darrell Johnson is a 53 yo M w/ PMH of ESRD(MWF), HTN, HLD, HIV (Viral load 48, CD4 670), HFpEF (grade 2 diastolic dysfx)presenting with dyspnea 2/2 pulmonary edema. Noted to have evidence of pulmonary edema and vascular congestion on chest x-ray. Noted to have had short dialysis  session on prior dialysis day due to attending a funeral. Improved after urgent dialysis. Had another dialysis session on day of discharge. Discharged with recommendation to attend regularly scheduled dialysis session.  2.  End Stage Renal Disease:: On admission noted to have Bun 91, Creatinine 15.43 with volume overload. Had urgent dialysis session in ED and another dialysis session inpatient the next day in attempt to challenge estimated dry weight. At discharge, weight of 115.7kg.  Recommended to follow up with nephrology and regular dialysis sessions.   Discharge Vitals:   BP (!) 161/78 (BP Location: Left Arm)   Pulse 84   Temp 98.7 F (37.1 C) (Oral)   Resp 18   Ht 5\' 9"  (1.753 m)   Wt 115.7 kg   SpO2 98%   BMI 37.67 kg/m   Pertinent Labs, Studies, and Procedures:  BMP Latest Ref Rng & Units 01/06/2019 01/04/2019 01/03/2019  Glucose 65 - 99 mg/dL 76 87 88  BUN 7 - 25 mg/dL 33(H) 48(H) 91(H)  Creatinine 0.70 - 1.33 mg/dL 7.18(H) 10.33(H) 15.43(H)  BUN/Creat Ratio 6 - 22 (calc) 5(L) - -  Sodium 135 - 146 mmol/L 139 135 139  Potassium 3.5 - 5.3 mmol/L 3.6 3.8 4.0  Chloride 98 - 110 mmol/L 95(L) 93(L) 93(L)  CO2 20 - 32 mmol/L 31 27 27   Calcium 8.6 - 10.3 mg/dL 8.5(L) 8.4(L) 8.8(L)   PORTABLE CHEST 1 VIEW FINDINGS: Cardiomegaly. Diffuse bilateral pulmonary infiltrates/edema. Small right pleural effusion. No pneumothorax. Old right rib fractures.  IMPRESSION: Cardiomegaly with diffuse bilateral pulmonary infiltrates/edema and small right-sided pleural effusion. CHF could present in this fashion.   BMP Latest Ref Rng & Units 01/06/2019 01/04/2019 01/03/2019  Glucose 65 - 99 mg/dL 76 87 88  BUN 7 - 25 mg/dL 33(H) 48(H) 91(H)  Creatinine 0.70 - 1.33 mg/dL 7.18(H) 10.33(H) 15.43(H)  BUN/Creat Ratio 6 - 22 (calc) 5(L) - -  Sodium 135 - 146 mmol/L 139 135 139  Potassium 3.5 - 5.3 mmol/L 3.6 3.8 4.0  Chloride 98 - 110 mmol/L 95(L) 93(L) 93(L)  CO2 20 - 32 mmol/L 31 27 27   Calcium 8.6 - 10.3 mg/dL 8.5(L) 8.4(L) 8.8(L)   Discharge Instructions: Discharge Instructions    (HEART FAILURE PATIENTS) Call MD:  Anytime you have any of the following symptoms: 1) 3 pound weight gain in 24 hours or 5 pounds in 1 week 2) shortness of breath, with or without a dry hacking cough 3) swelling in the hands, feet or stomach 4) if you have to sleep on extra pillows at night in order to breathe.   Complete by: As directed    Diet - low sodium heart healthy   Complete by: As  directed    Discharge instructions   Complete by: As directed    Please make certain to make it to a full dialysis session on 06/17. Although it was nice meeting you, I am sorry that you had to be admitted for increased volume. We appreciate you allowing [place] to take care of you while you were admitted.   Increase activity slowly   Complete by: As directed      Signed: Mosetta Anis, MD 01/07/2019, 11:10 PM   Pager: [phone]

## 2019-01-10 DIAGNOSIS — D631 Anemia in chronic kidney disease: Secondary | ICD-10-CM | POA: Diagnosis not present

## 2019-01-10 DIAGNOSIS — N186 End stage renal disease: Secondary | ICD-10-CM | POA: Diagnosis not present

## 2019-01-10 DIAGNOSIS — N2581 Secondary hyperparathyroidism of renal origin: Secondary | ICD-10-CM | POA: Diagnosis not present

## 2019-01-12 LAB — CBC WITH DIFFERENTIAL/PLATELET
Absolute Monocytes: 543 cells/uL (ref 200–950)
Basophils Absolute: 51 cells/uL (ref 0–200)
Basophils Relative: 1.1 %
Eosinophils Absolute: 248 cells/uL (ref 15–500)
Eosinophils Relative: 5.4 %
HCT: 30.8 % — ABNORMAL LOW (ref 38.5–50.0)
Hemoglobin: 10.8 g/dL — ABNORMAL LOW (ref 13.2–17.1)
Lymphs Abs: 952 cells/uL (ref 850–3900)
MCH: 34.2 pg — ABNORMAL HIGH (ref 27.0–33.0)
MCHC: 35.1 g/dL (ref 32.0–36.0)
MCV: 97.5 fL (ref 80.0–100.0)
MPV: 10.8 fL (ref 7.5–12.5)
Monocytes Relative: 11.8 %
Neutro Abs: 2806 cells/uL (ref 1500–7800)
Neutrophils Relative %: 61 %
Platelets: 194 10*3/uL (ref 140–400)
RBC: 3.16 10*6/uL — ABNORMAL LOW (ref 4.20–5.80)
RDW: 14.5 % (ref 11.0–15.0)
Total Lymphocyte: 20.7 %
WBC: 4.6 10*3/uL (ref 3.8–10.8)

## 2019-01-12 LAB — COMPLETE METABOLIC PANEL WITH GFR
AG Ratio: 1.4 (calc) (ref 1.0–2.5)
ALT: 12 U/L (ref 9–46)
AST: 20 U/L (ref 10–35)
Albumin: 4.1 g/dL (ref 3.6–5.1)
Alkaline phosphatase (APISO): 69 U/L (ref 35–144)
BUN/Creatinine Ratio: 5 (calc) — ABNORMAL LOW (ref 6–22)
BUN: 33 mg/dL — ABNORMAL HIGH (ref 7–25)
CO2: 31 mmol/L (ref 20–32)
Calcium: 8.5 mg/dL — ABNORMAL LOW (ref 8.6–10.3)
Chloride: 95 mmol/L — ABNORMAL LOW (ref 98–110)
Creat: 7.18 mg/dL — ABNORMAL HIGH (ref 0.70–1.33)
GFR, Est African American: 9 mL/min/{1.73_m2} — ABNORMAL LOW (ref >=60)
GFR, Est Non African American: 8 mL/min/{1.73_m2} — ABNORMAL LOW (ref >=60)
Globulin: 3 g/dL (calc) (ref 1.9–3.7)
Glucose, Bld: 76 mg/dL (ref 65–99)
Potassium: 3.6 mmol/L (ref 3.5–5.3)
Sodium: 139 mmol/L (ref 135–146)
Total Bilirubin: 0.4 mg/dL (ref 0.2–1.2)
Total Protein: 7.1 g/dL (ref 6.1–8.1)

## 2019-01-12 LAB — HIV-1 RNA QUANT-NO REFLEX-BLD
HIV 1 RNA Quant: <20 copies/mL
HIV-1 RNA Quant, Log: <1.3 Log copies/mL

## 2019-01-12 LAB — RPR: RPR Ser Ql: NONREACTIVE

## 2019-01-13 ENCOUNTER — Emergency Department (HOSPITAL_COMMUNITY)
Admission: EM | Admit: 2019-01-13 | Discharge: 2019-01-13 | Disposition: A | Discharge status: Home / Self Care | Payer: Medicare Other | Attending: Emergency Medicine | Admitting: Emergency Medicine

## 2019-01-13 ENCOUNTER — Emergency Department (HOSPITAL_COMMUNITY): Payer: Medicare Other

## 2019-01-13 ENCOUNTER — Other Ambulatory Visit: Payer: Self-pay

## 2019-01-13 ENCOUNTER — Encounter (HOSPITAL_COMMUNITY): Payer: Self-pay | Admitting: Emergency Medicine

## 2019-01-13 DIAGNOSIS — R51 Headache: Secondary | ICD-10-CM | POA: Insufficient documentation

## 2019-01-13 DIAGNOSIS — Z21 Asymptomatic human immunodeficiency virus [HIV] infection status: Secondary | ICD-10-CM | POA: Insufficient documentation

## 2019-01-13 DIAGNOSIS — R0602 Shortness of breath: Secondary | ICD-10-CM | POA: Insufficient documentation

## 2019-01-13 DIAGNOSIS — I10 Essential (primary) hypertension: Secondary | ICD-10-CM | POA: Diagnosis not present

## 2019-01-13 DIAGNOSIS — Z992 Dependence on renal dialysis: Secondary | ICD-10-CM | POA: Diagnosis not present

## 2019-01-13 DIAGNOSIS — N2581 Secondary hyperparathyroidism of renal origin: Secondary | ICD-10-CM | POA: Diagnosis not present

## 2019-01-13 DIAGNOSIS — F17210 Nicotine dependence, cigarettes, uncomplicated: Secondary | ICD-10-CM | POA: Diagnosis not present

## 2019-01-13 DIAGNOSIS — J45909 Unspecified asthma, uncomplicated: Secondary | ICD-10-CM | POA: Insufficient documentation

## 2019-01-13 DIAGNOSIS — N186 End stage renal disease: Secondary | ICD-10-CM | POA: Diagnosis not present

## 2019-01-13 DIAGNOSIS — I509 Heart failure, unspecified: Secondary | ICD-10-CM | POA: Diagnosis not present

## 2019-01-13 DIAGNOSIS — I132 Hypertensive heart and chronic kidney disease with heart failure and with stage 5 chronic kidney disease, or end stage renal disease: Secondary | ICD-10-CM | POA: Diagnosis not present

## 2019-01-13 DIAGNOSIS — R0902 Hypoxemia: Secondary | ICD-10-CM | POA: Diagnosis not present

## 2019-01-13 DIAGNOSIS — Z79899 Other long term (current) drug therapy: Secondary | ICD-10-CM | POA: Diagnosis not present

## 2019-01-13 DIAGNOSIS — R42 Dizziness and giddiness: Secondary | ICD-10-CM | POA: Diagnosis not present

## 2019-01-13 DIAGNOSIS — D631 Anemia in chronic kidney disease: Secondary | ICD-10-CM | POA: Diagnosis not present

## 2019-01-13 LAB — COMPREHENSIVE METABOLIC PANEL
ALT: 22 U/L (ref 0–44)
AST: 43 U/L — ABNORMAL HIGH (ref 15–41)
Albumin: 3.8 g/dL (ref 3.5–5.0)
Alkaline Phosphatase: 70 U/L (ref 38–126)
Anion gap: 15 (ref 5–15)
BUN: 22 mg/dL — ABNORMAL HIGH (ref 6–20)
CO2: 29 mmol/L (ref 22–32)
Calcium: 8.1 mg/dL — ABNORMAL LOW (ref 8.9–10.3)
Chloride: 93 mmol/L — ABNORMAL LOW (ref 98–111)
Creatinine, Ser: 6.66 mg/dL — ABNORMAL HIGH (ref 0.61–1.24)
GFR calc Af Amer: 10 mL/min — ABNORMAL LOW (ref >60)
GFR calc non Af Amer: 9 mL/min — ABNORMAL LOW (ref >60)
Glucose, Bld: 73 mg/dL (ref 70–99)
Potassium: 4.4 mmol/L (ref 3.5–5.1)
Sodium: 137 mmol/L (ref 135–145)
Total Bilirubin: 1.3 mg/dL — ABNORMAL HIGH (ref 0.3–1.2)
Total Protein: 7.2 g/dL (ref 6.5–8.1)

## 2019-01-13 LAB — CBC
HCT: 30.4 % — ABNORMAL LOW (ref 39.0–52.0)
Hemoglobin: 10.2 g/dL — ABNORMAL LOW (ref 13.0–17.0)
MCH: 33.7 pg (ref 26.0–34.0)
MCHC: 33.6 g/dL (ref 30.0–36.0)
MCV: 100.3 fL — ABNORMAL HIGH (ref 80.0–100.0)
Platelets: 195 10*3/uL (ref 150–400)
RBC: 3.03 MIL/uL — ABNORMAL LOW (ref 4.22–5.81)
RDW: 14.7 % (ref 11.5–15.5)
WBC: 5.1 10*3/uL (ref 4.0–10.5)
nRBC: 0 % (ref 0.0–0.2)

## 2019-01-13 LAB — BRAIN NATRIURETIC PEPTIDE: B Natriuretic Peptide: 472.3 pg/mL — ABNORMAL HIGH (ref 0.0–100.0)

## 2019-01-13 MED ORDER — ACETAMINOPHEN 500 MG PO TABS
1000.0000 mg | ORAL_TABLET | Freq: Once | ORAL | Status: AC
  Administered 2019-01-13: 1000 mg via ORAL
  Filled 2019-01-13: qty 2

## 2019-01-13 NOTE — ED Triage Notes (Signed)
Pt arrives via EMS from dialysis, pt was having SOB over the last several days. Able to go to this mornings dialysis and finish todays treatment.  Had noted to be hypertensive and continued to feel SOB after dialysis and was sent here for further eval. Denies fever, cough. Tested neg for Covid 1 week ago. NAD at present.  194/94, hr 80, 99%, 97.9T. 12 lead un remarkable. Denies pain.

## 2019-01-13 NOTE — ED Provider Notes (Signed)
[facility] Emergency Department Provider Note MRN:  [id number]  Arrival date & time: 01/13/19     Chief Complaint   Shortness of Breath and Hypertension   History of Present Illness   Darrell Johnson is a 53 y.o. year-old male with a history of CHF, ESRD presenting to the ED with chief complaint of shortness of breath.  Patient endorsing gradual onset shortness of breath for the past 2 or 3 days.  Received full session of dialysis today, sent here afterwards for continued shortness of breath and high blood pressure.  Patient explains that now he is here in the emergency department, he feels much better, shortness of breath improved, blood pressure also seems improved.  Endorsing a dull frontal headache, which is common for him after dialysis sessions.  Denies nausea or vomiting, no chest pain, no abdominal pain, no fever or cough.  Review of Systems  A complete 10 system review of systems was obtained and all systems are negative except as noted in the HPI and PMH.   Patient's Health History    Past Medical History:  Diagnosis Date  . Anemia   . Anxiety   . Arthritis    "knees" (04/13/2017)  . Asthma   . CHF (congestive heart failure) ([place])   . ESRD (end stage renal disease) on dialysis [facility])    "MWF; [facility]" (04/13/2017)  . Gout, unspecified 08/13/2009   Qualifier: Diagnosis of  By: Redmond Pulling  MD, Mateo Flow    . H1N1 influenza    March 2016  . History of blood transfusion 02/2017   "related to OR"  . HIV infection ([place]) 1980's   on ART therapy since, followed by ID clinic, complicated  by neuropathy  . Hyperlipidemia    hypertrygliceridemia determined ti be secondary to ART therpay  . HYPERTENSION 05/08/2006  . Pneumonia    "once; years ago" (04/13/2017)  . Rib fractures 01/2009  . Seizures ([place])    last seizure was in the 1990s, pt has family history of seizures; "probably related to alcohol" (04/13/2017)  . 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 Syphilis 1997   history of syphilis 1997  . Tobacco abuse     Past Surgical History:  Procedure Laterality Date  . AV FISTULA PLACEMENT Right 12/02/2013   Procedure: ARTERIOVENOUS (AV) FISTULA CREATION;  Surgeon: Rosetta Posner, MD;  Location: [place];  Service: Vascular;  Laterality: Right;  . INCISION AND DRAINAGE ABSCESS Right 03/09/2017   Procedure: INCISION AND DRAINAGE Right Scrotal Abscess;  Surgeon: Franchot Gallo, MD;  Location: [place];  Service: Urology;  Laterality: Right;  . THROMBECTOMY Right ~ 2016   "AV fistula clotted off"    Family History  Problem Relation Age of Onset  . Cancer Mother   . Hypertension Mother   . COPD Father   . Hypertension Father   . Diabetes Sister   . Hypertension Sister   . Diabetes Brother   . Hypertension Brother   . Stroke Neg Hx     Social History   Socioeconomic History  . Marital status: Married    Spouse name: Not on file  . Number of children: Not on file  . Years of education: Not on file  . Highest education level: Not on file  Occupational History  . Occupation: details cars  Social Needs  .  Financial resource strain: Not on file  . Food insecurity    Worry: Not on file    Inability: Not on file  . Transportation needs    Medical: Not on file    Non-medical: Not on file  Tobacco Use  . Smoking status: Current Some Day Smoker    Packs/day: 0.10    Years: 25.00    Pack years: 2.50    Types: Cigarettes    Last attempt to quit: 10/19/2017    Years since quitting: 1.2  . Smokeless tobacco: Never Used  . Tobacco comment: "quitting"  Substance and Sexual Activity  . Alcohol use: No    Alcohol/week: 0.0 standard drinks    Comment: 04/13/2017 "quit ~ 2014"  . Drug use: No  . Sexual activity: Never    Partners: Female    Comment: given condoms  Lifestyle  . Physical activity    Days  per week: Not on file    Minutes per session: Not on file  . Stress: Not on file  Relationships  . Social Herbalist on phone: Not on file    Gets together: Not on file    Attends religious service: Not on file    Active member of club or organization: Not on file    Attends meetings of clubs or organizations: Not on file    Relationship status: Not on file  . Intimate partner violence    Fear of current or ex partner: Not on file    Emotionally abused: Not on file    Physically abused: Not on file    Forced sexual activity: Not on file  Other Topics Concern  . Not on file  Social History Narrative   ** Merged History Encounter **         Physical Exam  Vital Signs and Nursing Notes reviewed Vitals:   01/13/19 1154  BP: (!) 180/94  Pulse: 84  Resp: 18  Temp: 98.5 F (36.9 C)  SpO2: 96%    CONSTITUTIONAL: Well-appearing, NAD NEURO:  Alert and oriented x 3, no focal deficits EYES:  eyes equal and reactive ENT/NECK:  no LAD, no JVD CARDIO: Regular rate, well-perfused, normal S1 and S2 PULM:  CTAB no wheezing or rhonchi GI/GU:  normal bowel sounds, non-distended, non-tender MSK/SPINE:  No gross deformities, no edema SKIN:  no rash, atraumatic PSYCH:  Appropriate speech and behavior  Diagnostic and Interventional Summary    EKG Interpretation  Date/Time:  Thursday January 13 2019 11:54:32 EDT Ventricular Rate:  82 PR Interval:    QRS Duration: 104 QT Interval:  445 QTC Calculation: 520 R Axis:   89 Text Interpretation:  Sinus rhythm Left atrial enlargement RSR' in V1 or V2, right VCD or RVH ST elev, probable normal early repol pattern Prolonged QT interval Confirmed by Gerlene Fee [phone]) on 01/13/2019 11:57:10 AM      Labs Reviewed  CBC - Abnormal; Notable for the following components:      Result Value   RBC 3.03 (*)    Hemoglobin 10.2 (*)    HCT 30.4 (*)    MCV 100.3 (*)    All other components within normal limits  COMPREHENSIVE METABOLIC  PANEL - Abnormal; Notable for the following components:   Chloride 93 (*)    BUN 22 (*)    Creatinine, Ser 6.66 (*)    Calcium 8.1 (*)    AST 43 (*)    Total Bilirubin 1.3 (*)    GFR calc  non Af Amer 9 (*)    GFR calc Af Amer 10 (*)    All other components within normal limits  BRAIN NATRIURETIC PEPTIDE - Abnormal; Notable for the following components:   B Natriuretic Peptide 472.3 (*)    All other components within normal limits    DG Chest [facility] 1 View  Final Result      Medications  acetaminophen (TYLENOL) tablet 1,000 mg (1,000 mg Oral Given 01/13/19 1222)     Procedures Critical Care  ED Course and Medical Decision Making  I have reviewed the triage vital signs and the nursing notes.  Pertinent labs & imaging results that were available during my care of the patient were reviewed by me and considered in my medical decision making (see below for details).  Normal vital signs, initially hypertensive but this is improving here in the emergency department, no increased work of breathing, lungs are clear, legs without edema, mild dull frontal headache but normal neurological exam.  Seems that patient was likely fluid overloaded and is now greatly improved after full course of dialysis.  Will observe for period of time, check some screening labs to evaluate electrolytes and BNP for fluid status, chest x-ray, reassess.  Anticipating discharge.  Labs are overall reassuring, BNP minimally elevated but the lowest we have on record.  Patient is sleeping comfortably, he is appropriate for discharge and follow-up with his normal dialysis session.  After the discussed management above, the patient was determined to be safe for discharge.  The patient was in agreement with this plan and all questions regarding their care were answered.  ED return precautions were discussed and the patient will return to the ED with any significant worsening of condition.  Barth Kirks. Sedonia Small, [place] [email or web address]  Final Clinical Impressions(s) / ED Diagnoses     ICD-10-CM   1. SOB (shortness of breath)  R06.02 DG Chest [facility]    DG Chest [facility]    ED Discharge Orders    None         Maudie Flakes, MD 01/13/19 1355

## 2019-01-13 NOTE — Patient Outreach (Signed)
[facility]) Care Management  01/13/2019   Darrell Johnson [birth year] [id number]  Subjective: Successful outreach to the patient.  Two patient identifiers obtained.  The patient states that he has been doing fair.  The patient was admitted to the ER on 6/15 and discharged on 6/16 for pulmonary edema.  He states that he had missed a dialysis appointment  due to a death in the family and his breathing became worse.  He was given to dialysis treatments while in the hospital.  He has followed up with his nephrologist and he states that he has all of his medications.  He normally has dialysis on M-W-F but unfortunately he was unable to go for his scheduled appointment yesterday and was at dialysis today.  He states that his blood pressure is running high and they are keeping him there until it lowers.  Advised the patient If possible to make his appointments for dialysis.  He verbalized understanding and states that there was a family situation why he did not make his appointment yesterday.  The patient stated that he was not feeling well and would call me back.  Current Medications:  Current Outpatient Medications  Medication Sig Dispense Refill  . amLODipine (NORVASC) 10 MG tablet Take 1 tablet (10 mg total) by mouth daily. 90 tablet 3  . budesonide-formoterol (SYMBICORT) 160-4.5 MCG/ACT inhaler Inhale 2 puffs into the lungs 2 (two) times daily. 1 Inhaler 5  . carvedilol (COREG) 25 MG tablet Take 1 tablet (25 mg total) by mouth 2 (two) times daily with a meal. 180 tablet 3  . Dolutegravir-Rilpivirine (JULUCA) 50-25 MG TABS Take 1 tablet by mouth daily with supper. 30 tablet 1  . doxazosin (CARDURA) 8 MG tablet Take 1 tablet (8 mg total) by mouth at bedtime. 90 tablet 3  . ferric citrate (AURYXIA) 1 GM 210 MG(Fe) tablet Take 630 mg by mouth 3 (three) times daily with meals. And 2 tablets (420mg ) with a snack    . furosemide (LASIX) 40 MG tablet TAKE 1 TABLET (40 MG TOTAL) BY MOUTH  DAILY AS NEEDED FOR FLUID. 90 tablet 0  . ipratropium-albuterol (DUONEB) 0.5-2.5 (3) MG/3ML SOLN Take 3 mLs by nebulization every 6 (six) hours as needed. 360 mL 1  . lisinopril (ZESTRIL) 10 MG tablet Take 10 mg by mouth daily.    Marland Kitchen loratadine (CLARITIN) 10 MG tablet Take 1 tablet (10 mg total) by mouth daily. (Patient taking differently: Take 10 mg by mouth daily as needed for allergies. ) 30 tablet 11  . pravastatin (PRAVACHOL) 40 MG tablet TAKE 1 TABLET BY MOUTH EVERYDAY AT BEDTIME (Patient taking differently: Take 40 mg by mouth daily. ) 90 tablet 0  . zidovudine (RETROVIR) 300 MG tablet Take 1 tablet (300 mg total) by mouth daily. 30 tablet 1  . albuterol (PROVENTIL HFA;VENTOLIN HFA) 108 (90 Base) MCG/ACT inhaler Inhale 2 puffs into the lungs every 4 (four) hours as needed for up to 30 days for wheezing or shortness of breath. 1 Inhaler 2  . nicotine (NICODERM CQ) 7 mg/24hr patch Place 1 patch (7 mg total) onto the skin daily. (Patient not taking: Reported on 01/03/2019) 28 patch 0   No current facility-administered medications for this visit.     Functional Status:  In your present state of health, do you have any difficulty performing the following activities: 01/03/2019  Hearing? N  Vision? N  Difficulty concentrating or making decisions? N  Walking or climbing stairs? N  Dressing or bathing?  N  Doing errands, shopping? N  Some recent data might be hidden    Fall/Depression Screening: Fall Risk  09/21/2018 07/19/2018 07/09/2018  Falls in the past year? 0 0 0  Risk for fall due to : - - -   PHQ 2/9 Scores 09/21/2018 07/09/2018 07/01/2018 10/22/2017 10/19/2017 09/10/2017 08/11/2017  PHQ - 2 Score 0 0 0 0 0 0 0  PHQ- 9 Score - - - 0 - 4 -    Assessment: Patient will continue to benefit from health coach outreach for disease management and support. THN CM Care Plan Problem One     Most Recent Value  THN Long Term Goal   In 30 days the patient will verbalize that his blood pressure have  remianed stable in the 122/70s  THN Long Term Goal Start Date  01/13/19  Interventions for Problem One Long Term Goal  Discussed medication and encouraged the patient to attend all of his dialysis appointment    '  Plan: [place] will contact patient in the month of July and patient agrees to next outreach.  Lazaro Arms RN, BSN, [place] Direct Dial:  [phone]  Fax: [phone]

## 2019-01-13 NOTE — Discharge Instructions (Addendum)
You were evaluated in the Emergency Department and after careful evaluation, we did not find any emergent condition requiring admission or further testing in the hospital.  Your symptoms today seem to be due to mild fluid overload.  Your testing today was otherwise reassuring and you are safe to go home and receive your normal dialysis session tomorrow.  Please return to the Emergency Department if you experience any worsening of your condition.  We encourage you to follow up with a primary care provider.  Thank you for allowing [place] to be a part of your care.

## 2019-01-14 DIAGNOSIS — N186 End stage renal disease: Secondary | ICD-10-CM | POA: Diagnosis not present

## 2019-01-14 DIAGNOSIS — D631 Anemia in chronic kidney disease: Secondary | ICD-10-CM | POA: Diagnosis not present

## 2019-01-14 DIAGNOSIS — N2581 Secondary hyperparathyroidism of renal origin: Secondary | ICD-10-CM | POA: Diagnosis not present

## 2019-01-17 DIAGNOSIS — N186 End stage renal disease: Secondary | ICD-10-CM | POA: Diagnosis not present

## 2019-01-17 DIAGNOSIS — N2581 Secondary hyperparathyroidism of renal origin: Secondary | ICD-10-CM | POA: Diagnosis not present

## 2019-01-17 DIAGNOSIS — D631 Anemia in chronic kidney disease: Secondary | ICD-10-CM | POA: Diagnosis not present

## 2019-01-19 ENCOUNTER — Ambulatory Visit (INDEPENDENT_AMBULATORY_CARE_PROVIDER_SITE_OTHER): Payer: Medicare Other | Admitting: Internal Medicine

## 2019-01-19 ENCOUNTER — Encounter: Payer: Self-pay | Admitting: Internal Medicine

## 2019-01-19 ENCOUNTER — Other Ambulatory Visit: Payer: Self-pay

## 2019-01-19 VITALS — BP 155/73 | HR 88 | Temp 99.1°F | Wt 252.0 lb

## 2019-01-19 DIAGNOSIS — B20 Human immunodeficiency virus [HIV] disease: Secondary | ICD-10-CM | POA: Diagnosis not present

## 2019-01-19 DIAGNOSIS — D509 Iron deficiency anemia, unspecified: Secondary | ICD-10-CM | POA: Diagnosis not present

## 2019-01-19 DIAGNOSIS — F419 Anxiety disorder, unspecified: Secondary | ICD-10-CM | POA: Diagnosis not present

## 2019-01-19 DIAGNOSIS — R51 Headache: Secondary | ICD-10-CM | POA: Diagnosis not present

## 2019-01-19 DIAGNOSIS — R519 Headache, unspecified: Secondary | ICD-10-CM

## 2019-01-19 DIAGNOSIS — D631 Anemia in chronic kidney disease: Secondary | ICD-10-CM | POA: Diagnosis not present

## 2019-01-19 DIAGNOSIS — Z992 Dependence on renal dialysis: Secondary | ICD-10-CM

## 2019-01-19 DIAGNOSIS — I120 Hypertensive chronic kidney disease with stage 5 chronic kidney disease or end stage renal disease: Secondary | ICD-10-CM | POA: Diagnosis not present

## 2019-01-19 DIAGNOSIS — N186 End stage renal disease: Secondary | ICD-10-CM

## 2019-01-19 DIAGNOSIS — N2581 Secondary hyperparathyroidism of renal origin: Secondary | ICD-10-CM | POA: Diagnosis not present

## 2019-01-19 MED ORDER — BUPROPION HCL ER (SR) 100 MG PO TB12: 100.0000 mg | ORAL_TABLET | Freq: Two times a day (BID) | ORAL | 6 refills | Status: DC

## 2019-01-19 MED ORDER — AMITRIPTYLINE HCL 25 MG PO TABS: 25.0000 mg | ORAL_TABLET | Freq: Every day | ORAL | 3 refills | Status: DC

## 2019-01-19 NOTE — Progress Notes (Signed)
Patient ID: Darrell Johnson, male   DOB: 03-01-1966, 53 y.o.   MRN: [id number]  HPI [place] with ESRD on HD, HIV disease, CD 4 count of 390/VL 66, currently on DLG-RPV (as juluca) plus ZDV. Followed at [facility] for transplant candidate   Has sinus pressure, congestion, headache. Has phlegm-occ takes mucinex  Looking for new pcp  Has had multiple family members and friends who had coronavirus and lost mom in law to coronavirus  Has had some anxiety with thinking about affected family members    Outpatient Encounter Medications as of 01/19/2019  Medication Sig  . amLODipine (NORVASC) 10 MG tablet Take 1 tablet (10 mg total) by mouth daily.  . budesonide-formoterol (SYMBICORT) 160-4.5 MCG/ACT inhaler Inhale 2 puffs into the lungs 2 (two) times daily.  . carvedilol (COREG) 25 MG tablet Take 1 tablet (25 mg total) by mouth 2 (two) times daily with a meal.  . Dolutegravir-Rilpivirine (JULUCA) 50-25 MG TABS Take 1 tablet by mouth daily with supper.  . doxazosin (CARDURA) 8 MG tablet Take 1 tablet (8 mg total) by mouth at bedtime.  . ferric citrate (AURYXIA) 1 GM 210 MG(Fe) tablet Take 630 mg by mouth 3 (three) times daily with meals. And 2 tablets (420mg ) with a snack  . furosemide (LASIX) 40 MG tablet TAKE 1 TABLET (40 MG TOTAL) BY MOUTH DAILY AS NEEDED FOR FLUID.  Marland Kitchen ipratropium-albuterol (DUONEB) 0.5-2.5 (3) MG/3ML SOLN Take 3 mLs by nebulization every 6 (six) hours as needed.  Marland Kitchen lisinopril (ZESTRIL) 10 MG tablet Take 10 mg by mouth daily.  Marland Kitchen loratadine (CLARITIN) 10 MG tablet Take 1 tablet (10 mg total) by mouth daily. (Patient taking differently: Take 10 mg by mouth daily as needed for allergies. )  . nicotine (NICODERM CQ) 7 mg/24hr patch Place 1 patch (7 mg total) onto the skin daily.  . pravastatin (PRAVACHOL) 40 MG tablet TAKE 1 TABLET BY MOUTH EVERYDAY AT BEDTIME (Patient taking differently: Take 40 mg by mouth daily. )  . zidovudine (RETROVIR) 300 MG tablet Take 1 tablet (300 mg  total) by mouth daily.  Marland Kitchen albuterol (PROVENTIL HFA;VENTOLIN HFA) 108 (90 Base) MCG/ACT inhaler Inhale 2 puffs into the lungs every 4 (four) hours as needed for up to 30 days for wheezing or shortness of breath.   No facility-administered encounter medications on file as of 01/19/2019.      Patient Active Problem List   Diagnosis Date Noted  . Moderate mitral regurgitation   . Acute respiratory failure with hypoxia ([place]) 03/29/2017  . Trigger finger of right hand 09/11/2016  . Hyperlipidemia 03/31/2016  . Anxiety state 03/11/2016  . Lumbar radiculopathy 01/28/2016  . Seasonal allergies   . Healthcare maintenance 05/03/2015  . Anemia in chronic kidney disease 08/30/2014  . ESRD on dialysis ([place]) 12/27/2013  . History of syphilis 11/07/2013  . Arthritis 09/09/2013  . Tobacco abuse 09/07/2013  . Chronic pain disorder 04/28/2013  . Gout 08/13/2009  . HIV disease ([place]) 05/08/2006  . Essential hypertension 05/08/2006  . SEIZURE DISORDER 05/08/2006   Soc hx: has quit smoking  There are no preventive care reminders to display for this patient.   Review of Systems 12 point ros is negative except what is mentioned above Physical Exam   BP (!) 155/73   Pulse 88   Temp 99.1 F (37.3 C)   Wt 252 lb (114.3 kg)   BMI 37.21 kg/m   Physical Exam  Constitutional: He is oriented to person, place, and time.  He appears well-developed and well-nourished. No distress.  HENT:  Mouth/Throat: Oropharynx is clear and moist. No oropharyngeal exudate.  Cardiovascular: Normal rate, regular rhythm and normal heart sounds. Exam reveals no gallop and no friction rub.  No murmur heard.  Pulmonary/Chest: Effort normal and breath sounds normal. No respiratory distress. He has no wheezes.   Neurological: He is alert and oriented to person, place, and time.  Skin: Skin is warm and dry. No rash noted. No erythema.  Psychiatric: He has a normal mood and affect. His behavior is normal.    Lab Results   Component Value Date   CD4TCELL 44 01/06/2019   Lab Results  Component Value Date   CD4TABS 428 01/06/2019   CD4TABS 670 11/03/2017   CD4TABS 390 (L) 08/27/2017   Lab Results  Component Value Date   HIV1RNAQUANT <20 NOT DETECTED 01/06/2019   Lab Results  Component Value Date   HEPBSAB POS (A) 08/17/2014   Lab Results  Component Value Date   LABRPR NON-REACTIVE 01/06/2019    CBC Lab Results  Component Value Date   WBC 5.1 01/13/2019   RBC 3.03 (L) 01/13/2019   HGB 10.2 (L) 01/13/2019   HCT 30.4 (L) 01/13/2019   PLT 195 01/13/2019   MCV 100.3 (H) 01/13/2019   MCH 33.7 01/13/2019   MCHC 33.6 01/13/2019   RDW 14.7 01/13/2019   LYMPHSABS 952 01/06/2019   MONOABS 0.6 01/03/2019   EOSABS 248 01/06/2019    BMET Lab Results  Component Value Date   NA 137 01/13/2019   K 4.4 01/13/2019   CL 93 (L) 01/13/2019   CO2 29 01/13/2019   GLUCOSE 73 01/13/2019   BUN 22 (H) 01/13/2019   CREATININE 6.66 (H) 01/13/2019   CALCIUM 8.1 (L) 01/13/2019   GFRNONAA 9 (L) 01/13/2019   GFRAA 10 (L) 01/13/2019      Assessment and Plan  hiv disease = well controlled continue on DLG-RPV  plus ZDV.  Anxiety = will start back wellbutrin as he reports that helped him in the past  HA= take tylenol as needed but not considerable improvement. Currently tylenol taking 3-4 times per day. Will do a trial amitryptiline 25mg  QHS

## 2019-01-21 DIAGNOSIS — D509 Iron deficiency anemia, unspecified: Secondary | ICD-10-CM | POA: Diagnosis not present

## 2019-01-21 DIAGNOSIS — D631 Anemia in chronic kidney disease: Secondary | ICD-10-CM | POA: Diagnosis not present

## 2019-01-21 DIAGNOSIS — N186 End stage renal disease: Secondary | ICD-10-CM | POA: Diagnosis not present

## 2019-01-21 DIAGNOSIS — N2581 Secondary hyperparathyroidism of renal origin: Secondary | ICD-10-CM | POA: Diagnosis not present

## 2019-01-21 NOTE — Progress Notes (Signed)
Chart reviewed; [place] Supervisor  [phone]

## 2019-01-23 ENCOUNTER — Emergency Department (HOSPITAL_COMMUNITY)
Admission: EM | Admit: 2019-01-23 | Discharge: 2019-01-24 | Disposition: A | Discharge status: Home / Self Care | Payer: Medicare Other | Attending: Emergency Medicine | Admitting: Emergency Medicine

## 2019-01-23 ENCOUNTER — Emergency Department (HOSPITAL_COMMUNITY): Payer: Medicare Other

## 2019-01-23 ENCOUNTER — Other Ambulatory Visit: Payer: Self-pay

## 2019-01-23 DIAGNOSIS — N186 End stage renal disease: Secondary | ICD-10-CM | POA: Insufficient documentation

## 2019-01-23 DIAGNOSIS — J45909 Unspecified asthma, uncomplicated: Secondary | ICD-10-CM | POA: Diagnosis not present

## 2019-01-23 DIAGNOSIS — Z992 Dependence on renal dialysis: Secondary | ICD-10-CM | POA: Diagnosis not present

## 2019-01-23 DIAGNOSIS — F17210 Nicotine dependence, cigarettes, uncomplicated: Secondary | ICD-10-CM | POA: Insufficient documentation

## 2019-01-23 DIAGNOSIS — Z21 Asymptomatic human immunodeficiency virus [HIV] infection status: Secondary | ICD-10-CM | POA: Diagnosis not present

## 2019-01-23 DIAGNOSIS — Z79899 Other long term (current) drug therapy: Secondary | ICD-10-CM | POA: Diagnosis not present

## 2019-01-23 DIAGNOSIS — I509 Heart failure, unspecified: Secondary | ICD-10-CM | POA: Diagnosis not present

## 2019-01-23 DIAGNOSIS — I132 Hypertensive heart and chronic kidney disease with heart failure and with stage 5 chronic kidney disease, or end stage renal disease: Secondary | ICD-10-CM | POA: Diagnosis not present

## 2019-01-23 DIAGNOSIS — I10 Essential (primary) hypertension: Secondary | ICD-10-CM | POA: Diagnosis not present

## 2019-01-23 DIAGNOSIS — Z209 Contact with and (suspected) exposure to unspecified communicable disease: Secondary | ICD-10-CM | POA: Diagnosis not present

## 2019-01-23 DIAGNOSIS — Z20828 Contact with and (suspected) exposure to other viral communicable diseases: Secondary | ICD-10-CM | POA: Insufficient documentation

## 2019-01-23 DIAGNOSIS — R0602 Shortness of breath: Secondary | ICD-10-CM | POA: Diagnosis not present

## 2019-01-23 DIAGNOSIS — R0902 Hypoxemia: Secondary | ICD-10-CM | POA: Diagnosis not present

## 2019-01-23 DIAGNOSIS — I4581 Long QT syndrome: Secondary | ICD-10-CM | POA: Diagnosis not present

## 2019-01-23 DIAGNOSIS — R457 State of emotional shock and stress, unspecified: Secondary | ICD-10-CM | POA: Diagnosis not present

## 2019-01-23 LAB — BASIC METABOLIC PANEL
Anion gap: 16 — ABNORMAL HIGH (ref 5–15)
BUN: 49 mg/dL — ABNORMAL HIGH (ref 6–20)
CO2: 27 mmol/L (ref 22–32)
Calcium: 8.5 mg/dL — ABNORMAL LOW (ref 8.9–10.3)
Chloride: 96 mmol/L — ABNORMAL LOW (ref 98–111)
Creatinine, Ser: 13.02 mg/dL — ABNORMAL HIGH (ref 0.61–1.24)
GFR calc Af Amer: 4 mL/min — ABNORMAL LOW (ref >60)
GFR calc non Af Amer: 4 mL/min — ABNORMAL LOW (ref >60)
Glucose, Bld: 83 mg/dL (ref 70–99)
Potassium: 3.8 mmol/L (ref 3.5–5.1)
Sodium: 139 mmol/L (ref 135–145)

## 2019-01-23 LAB — CBC WITH DIFFERENTIAL/PLATELET
Abs Immature Granulocytes: 0.03 10*3/uL (ref 0.00–0.07)
Basophils Absolute: 0.1 10*3/uL (ref 0.0–0.1)
Basophils Relative: 1 %
Eosinophils Absolute: 0.3 10*3/uL (ref 0.0–0.5)
Eosinophils Relative: 3 %
HCT: 27.8 % — ABNORMAL LOW (ref 39.0–52.0)
Hemoglobin: 9.3 g/dL — ABNORMAL LOW (ref 13.0–17.0)
Immature Granulocytes: 0 %
Lymphocytes Relative: 15 %
Lymphs Abs: 1.3 10*3/uL (ref 0.7–4.0)
MCH: 33.7 pg (ref 26.0–34.0)
MCHC: 33.5 g/dL (ref 30.0–36.0)
MCV: 100.7 fL — ABNORMAL HIGH (ref 80.0–100.0)
Monocytes Absolute: 0.6 10*3/uL (ref 0.1–1.0)
Monocytes Relative: 7 %
Neutro Abs: 6 10*3/uL (ref 1.7–7.7)
Neutrophils Relative %: 74 %
Platelets: 190 10*3/uL (ref 150–400)
RBC: 2.76 MIL/uL — ABNORMAL LOW (ref 4.22–5.81)
RDW: 15 % (ref 11.5–15.5)
WBC: 8.2 10*3/uL (ref 4.0–10.5)
nRBC: 0 % (ref 0.0–0.2)

## 2019-01-23 LAB — TROPONIN I (HIGH SENSITIVITY): Troponin I (High Sensitivity): 24 ng/L — ABNORMAL HIGH (ref <18)

## 2019-01-23 LAB — BRAIN NATRIURETIC PEPTIDE: B Natriuretic Peptide: 718.6 pg/mL — ABNORMAL HIGH (ref 0.0–100.0)

## 2019-01-23 LAB — SARS CORONAVIRUS 2 BY RT PCR (HOSPITAL ORDER, PERFORMED IN ~~LOC~~ HOSPITAL LAB): SARS Coronavirus 2: NEGATIVE

## 2019-01-23 MED ORDER — PREDNISONE 20 MG PO TABS: 40.0000 mg | ORAL_TABLET | Freq: Every day | ORAL | 0 refills | Status: DC

## 2019-01-23 MED ORDER — ACETAMINOPHEN 325 MG PO TABS
650.0000 mg | ORAL_TABLET | Freq: Once | ORAL | Status: AC
  Administered 2019-01-23: 650 mg via ORAL
  Filled 2019-01-23: qty 2

## 2019-01-23 MED ORDER — PREDNISONE 20 MG PO TABS
60.0000 mg | ORAL_TABLET | Freq: Once | ORAL | Status: AC
  Administered 2019-01-23: 60 mg via ORAL
  Filled 2019-01-23: qty 3

## 2019-01-23 MED ORDER — ALBUTEROL SULFATE HFA 108 (90 BASE) MCG/ACT IN AERS
4.0000 | INHALATION_SPRAY | Freq: Once | RESPIRATORY_TRACT | Status: AC
  Administered 2019-01-23: 4 via RESPIRATORY_TRACT
  Filled 2019-01-23: qty 6.7

## 2019-01-23 NOTE — ED Triage Notes (Signed)
Pt c/o shortness of since yesterday.  Denies any chest pain.  Alert and oriented x4.  Pt in NAD at this time.  Pt was seen here last month for the same.  BS decreased in all lobes

## 2019-01-23 NOTE — ED Provider Notes (Signed)
[facility] EMERGENCY DEPARTMENT Provider Note   CSN: [id number] Arrival date & time: 01/23/19  2041    History   Chief Complaint Chief Complaint  Patient presents with   Shortness of Breath    HPI Darrell Johnson is a 53 y.o. male.     The history is provided by the patient.  Shortness of Breath Severity:  Mild Timing:  Intermittent Progression:  Waxing and waning Chronicity:  Recurrent Context: URI   Relieved by:  Nothing Worsened by:  Activity Associated symptoms: no abdominal pain, no chest pain, no cough, no ear pain, no fever, no rash, no sore throat and no vomiting   Risk factors comment:  ESRD, asthma, CHF   Past Medical History:  Diagnosis Date   Anemia    Anxiety    Arthritis    "knees" (04/13/2017)   Asthma    CHF (congestive heart failure) ([place])    ESRD (end stage renal disease) on dialysis ([place])    "MWF; [facility]" (04/13/2017)   Gout, unspecified 08/13/2009   Qualifier: Diagnosis of  By: Redmond Pulling  MD, Valerie     H1N1 influenza    March 2016   History of blood transfusion 02/2017   "related to OR"   HIV infection ([place]) 1980's   on ART therapy since, followed by ID clinic, complicated  by neuropathy   Hyperlipidemia    hypertrygliceridemia determined ti be secondary to ART therpay   HYPERTENSION 05/08/2006   Pneumonia    "once; years ago" (04/13/2017)   Rib fractures 01/2009   Seizures ([place])    last seizure was in the 1990s, pt has family history of seizures; "probably related to alcohol" (04/13/2017)   Sexually transmitted disease    gonorrhea and trichomonas, penile condylomata - s/p circu,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 that was the reason for her at all as if she is a   Syphilis 1997   history of syphilis 1997   Tobacco abuse     Patient Active Problem List   Diagnosis Date Noted   Moderate mitral regurgitation    Acute respiratory failure with hypoxia ([place]) 03/29/2017   Trigger finger of  right hand 09/11/2016   Hyperlipidemia 03/31/2016   Anxiety state 03/11/2016   Lumbar radiculopathy 01/28/2016   Seasonal allergies    Healthcare maintenance 05/03/2015   Anemia in chronic kidney disease 08/30/2014   ESRD on dialysis ([place]) 12/27/2013   History of syphilis 11/07/2013   Arthritis 09/09/2013   Tobacco abuse 09/07/2013   Chronic pain disorder 04/28/2013   Gout 08/13/2009   HIV disease ([place]) 05/08/2006   Essential hypertension 05/08/2006   SEIZURE DISORDER 05/08/2006    Past Surgical History:  Procedure Laterality Date   AV FISTULA PLACEMENT Right 12/02/2013   Procedure: ARTERIOVENOUS (AV) FISTULA CREATION;  Surgeon: Rosetta Posner, MD;  Location: [place];  Service: Vascular;  Laterality: Right;   INCISION AND DRAINAGE ABSCESS Right 03/09/2017   Procedure: INCISION AND DRAINAGE Right Scrotal Abscess;  Surgeon: Franchot Gallo, MD;  Location: [place];  Service: Urology;  Laterality: Right;   THROMBECTOMY Right ~ 2016   "AV fistula clotted off"        Home Medications    Prior to Admission medications   Medication Sig Start Date End Date Taking? Authorizing Provider  albuterol (PROVENTIL HFA;VENTOLIN HFA) 108 (90 Base) MCG/ACT inhaler Inhale 2 puffs into the lungs every 4 (four) hours as needed for up to 30 days for wheezing or shortness of breath. 09/21/18  01/03/19  Kerin Perna, NP  amitriptyline (ELAVIL) 25 MG tablet Take 1 tablet (25 mg total) by mouth at bedtime. 01/19/19   Carlyle Basques, MD  amLODipine (NORVASC) 10 MG tablet Take 1 tablet (10 mg total) by mouth daily. 09/21/18   Kerin Perna, NP  budesonide-formoterol (SYMBICORT) 160-4.5 MCG/ACT inhaler Inhale 2 puffs into the lungs 2 (two) times daily. 09/21/18   Kerin Perna, NP  buPROPion (WELLBUTRIN SR) 100 MG 12 hr tablet Take 1 tablet (100 mg total) by mouth 2 (two) times daily. 01/19/19   Carlyle Basques, MD  carvedilol (COREG) 25 MG tablet Take 1 tablet (25 mg total) by mouth 2  (two) times daily with a meal. 09/21/18   Kerin Perna, NP  Dolutegravir-Rilpivirine (JULUCA) 50-25 MG TABS Take 1 tablet by mouth daily with supper. 12/29/18   Kuppelweiser, Cassie L, RPH-CPP  doxazosin (CARDURA) 8 MG tablet Take 1 tablet (8 mg total) by mouth at bedtime. 09/21/18   Kerin Perna, NP  ferric citrate (AURYXIA) 1 GM 210 MG(Fe) tablet Take 630 mg by mouth 3 (three) times daily with meals. And 2 tablets (420mg ) with a snack    [provider]  furosemide (LASIX) 40 MG tablet TAKE 1 TABLET (40 MG TOTAL) BY MOUTH DAILY AS NEEDED FOR FLUID. 12/14/18   Kerin Perna, NP  ipratropium-albuterol (DUONEB) 0.5-2.5 (3) MG/3ML SOLN Take 3 mLs by nebulization every 6 (six) hours as needed. 09/21/18   Kerin Perna, NP  lisinopril (ZESTRIL) 10 MG tablet Take 10 mg by mouth daily. 09/06/18   [provider]  loratadine (CLARITIN) 10 MG tablet Take 1 tablet (10 mg total) by mouth daily. Patient taking differently: Take 10 mg by mouth daily as needed for allergies.  09/21/18   Kerin Perna, NP  nicotine (NICODERM CQ) 7 mg/24hr patch Place 1 patch (7 mg total) onto the skin daily. 09/21/18   Kerin Perna, NP  pravastatin (PRAVACHOL) 40 MG tablet TAKE 1 TABLET BY MOUTH EVERYDAY AT BEDTIME Patient taking differently: Take 40 mg by mouth daily.  12/16/18   Kerin Perna, NP  predniSONE (DELTASONE) 20 MG tablet Take 2 tablets (40 mg total) by mouth daily for 4 days. 01/23/19 01/27/19  Keyshawna Prouse, DO  zidovudine (RETROVIR) 300 MG tablet Take 1 tablet (300 mg total) by mouth daily. 12/29/18   Kuppelweiser, Gillian Shields, RPH-CPP    Family History Family History  Problem Relation Age of Onset   Cancer Mother    Hypertension Mother    COPD Father    Hypertension Father    Diabetes Sister    Hypertension Sister    Diabetes Brother    Hypertension Brother    Stroke Neg Hx     Social History Social History   Tobacco Use   Smoking status:  Current Some Day Smoker    Packs/day: 0.10    Years: 25.00    Pack years: 2.50    Types: Cigarettes    Last attempt to quit: 10/19/2017    Years since quitting: 1.2   Smokeless tobacco: Never Used   Tobacco comment: "quitting"  Substance Use Topics   Alcohol use: No    Alcohol/week: 0.0 standard drinks    Comment: 04/13/2017 "quit ~ 2014"   Drug use: No     Allergies   Patient has no known allergies.   Review of Systems Review of Systems  Constitutional: Negative for chills and fever.  HENT: Negative for ear pain  and sore throat.   Eyes: Negative for pain and visual disturbance.  Respiratory: Positive for shortness of breath. Negative for cough.   Cardiovascular: Negative for chest pain and palpitations.  Gastrointestinal: Negative for abdominal pain and vomiting.  Genitourinary: Negative for dysuria and hematuria.  Musculoskeletal: Negative for arthralgias and back pain.  Skin: Negative for color change and rash.  Neurological: Negative for seizures and syncope.  All other systems reviewed and are negative.    Physical Exam Updated Vital Signs  ED Triage Vitals  Enc Vitals Group     BP 01/23/19 2048 (!) 173/90     Pulse Rate 01/23/19 2048 80     Resp 01/23/19 2048 18     Temp 01/23/19 2048 98.1 F (36.7 C)     Temp Source 01/23/19 2048 Oral     SpO2 01/23/19 2048 96 %     Weight 01/23/19 2050 251 lb 15.8 oz (114.3 kg)     Height 01/23/19 2050 5\' 9"  (1.753 m)     Head Circumference --      Peak Flow --      Pain Score 01/23/19 2050 0     Pain Loc --      Pain Edu? --      Excl. in [place]? --     Physical Exam Vitals signs and nursing note reviewed.  Constitutional:      General: He is not in acute distress.    Appearance: He is well-developed. He is not ill-appearing.  HENT:     Head: Normocephalic and atraumatic.  Eyes:     Extraocular Movements: Extraocular movements intact.     Conjunctiva/sclera: Conjunctivae normal.     Pupils: Pupils are equal,  round, and reactive to light.  Neck:     Musculoskeletal: Normal range of motion and neck supple.  Cardiovascular:     Rate and Rhythm: Normal rate and regular rhythm.     Pulses: Normal pulses.     Heart sounds: Normal heart sounds. No murmur.  Pulmonary:     Effort: Pulmonary effort is normal. No respiratory distress.     Breath sounds: Decreased breath sounds present. No wheezing, rhonchi or rales.  Abdominal:     Palpations: Abdomen is soft.     Tenderness: There is no abdominal tenderness.  Musculoskeletal: Normal range of motion.     Right lower leg: No edema.     Left lower leg: No edema.  Skin:    General: Skin is warm and dry.  Neurological:     General: No focal deficit present.     Mental Status: He is alert.      ED Treatments / Results  Labs (all labs ordered are listed, but only abnormal results are displayed) Labs Reviewed  CBC WITH DIFFERENTIAL/PLATELET - Abnormal; Notable for the following components:      Result Value   RBC 2.76 (*)    Hemoglobin 9.3 (*)    HCT 27.8 (*)    MCV 100.7 (*)    All other components within normal limits  BASIC METABOLIC PANEL - Abnormal; Notable for the following components:   Chloride 96 (*)    BUN 49 (*)    Creatinine, Ser 13.02 (*)    Calcium 8.5 (*)    GFR calc non Af Amer 4 (*)    GFR calc Af Amer 4 (*)    Anion gap 16 (*)    All other components within normal limits  TROPONIN I (HIGH SENSITIVITY) -  Abnormal; Notable for the following components:   Troponin I (High Sensitivity) 24 (*)    All other components within normal limits  BRAIN NATRIURETIC PEPTIDE - Abnormal; Notable for the following components:   B Natriuretic Peptide 718.6 (*)    All other components within normal limits  SARS CORONAVIRUS 2 (HOSPITAL ORDER, [facility])    EKG EKG Interpretation  Date/Time:  Sunday January 23 2019 22:29:13 EDT Ventricular Rate:  78 PR Interval:    QRS Duration: 105 QT Interval:  447 QTC  Calculation: 510 R Axis:   79 Text Interpretation:  Sinus rhythm Probable left atrial enlargement RSR' in V1 or V2, probably normal variant ST elev, probable normal early repol pattern Prolonged QT interval Confirmed by Lennice Sites [phone]) on 01/23/2019 10:36:13 PM   Radiology Dg Chest Portable 1 View  Result Date: 01/23/2019 CLINICAL DATA:  Shortness of breath for 1 day. EXAM: PORTABLE CHEST 1 VIEW COMPARISON:  Radiograph 01/13/2019, additional priors. FINDINGS: Slight decrease in cardiomegaly from prior exam. Right pleural effusion and basilar opacity appears similar to prior exam. Vascular congestion without pulmonary edema. Linear scarring in the right mid lung. No focal airspace disease or pneumothorax. IMPRESSION: 1. Slight decrease in cardiomegaly from prior exam. 2. Unchanged right pleural effusion and basilar opacity, which may be due to scarring or atelectasis. Electronically Signed   By: Keith Rake M.D.   On: 01/23/2019 21:08    Procedures Procedures (including critical care time)  Medications Ordered in ED Medications  albuterol (VENTOLIN HFA) 108 (90 Base) MCG/ACT inhaler 4 puff (has no administration in time range)  predniSONE (DELTASONE) tablet 60 mg (has no administration in time range)  acetaminophen (TYLENOL) tablet 650 mg (650 mg Oral Given 01/23/19 2240)     Initial Impression / Assessment and Plan / ED Course  I have reviewed the triage vital signs and the nursing notes.  Pertinent labs & imaging results that were available during my care of the patient were reviewed by me and considered in my medical decision making (see chart for details).     Darrell Johnson is a 53 year old male with history of heart failure, end-stage renal disease, asthma/COPD who presents to the ED with shortness of breath, nasal congestion.  Patient denies any chest pain.  Patient with normal work of breathing.  Some wheezing throughout on exam.  No signs of volume overload on his legs.   No rales.  Patient has dialysis tomorrow.  EKG shows sinus rhythm.  Troponin and BNP slightly positive but appears baseline.  Chest x-ray showed no obvious signs of volume overload.  Overall do not think there is any acute cardiac or pulmonary process other than likely mild COPD/asthma exacerbation.  Possibly allergies.  Coronavirus test was sent for and negative. Patient felt better after albuterol.  Given prednisone.  Will prescribe prednisone for the next several days.  Patient has dialysis in the morning.  Was given return precautions and discharged from ED in good condition.  This chart was dictated using voice recognition software.  Despite best efforts to proofread,  errors can occur which can change the documentation meaning.   Darrell Johnson was evaluated in Emergency Department on 01/23/2019 for the symptoms described in the history of present illness. He was evaluated in the context of the global COVID-19 pandemic, which necessitated consideration that the patient might be at risk for infection with the SARS-CoV-2 virus that causes COVID-19. Institutional protocols and algorithms that pertain to the  evaluation of patients at risk for COVID-19 are in a state of rapid change based on information released by regulatory bodies including the CDC and federal and state organizations. These policies and algorithms were followed during the patient's care in the ED.   Final Clinical Impressions(s) / ED Diagnoses   Final diagnoses:  Shortness of breath    ED Discharge Orders         Ordered    predniSONE (DELTASONE) 20 MG tablet  Daily     01/23/19 2347           Lennice Sites, DO 01/23/19 2348

## 2019-01-23 NOTE — Discharge Instructions (Addendum)
Continue steroids.  Your coronavirus test is negative.  Make sure to go to dialysis tomorrow.  Please return to the ED if your symptoms worsen.

## 2019-01-23 NOTE — ED Notes (Addendum)
Pt doesn't want this tech attempting to get his blood due to having a unsuccessful blood draw last time pt came in. RN Tiffany informed.

## 2019-01-24 DIAGNOSIS — N186 End stage renal disease: Secondary | ICD-10-CM | POA: Diagnosis not present

## 2019-01-24 DIAGNOSIS — N2581 Secondary hyperparathyroidism of renal origin: Secondary | ICD-10-CM | POA: Diagnosis not present

## 2019-01-24 DIAGNOSIS — D631 Anemia in chronic kidney disease: Secondary | ICD-10-CM | POA: Diagnosis not present

## 2019-01-24 DIAGNOSIS — D509 Iron deficiency anemia, unspecified: Secondary | ICD-10-CM | POA: Diagnosis not present

## 2019-01-24 MED FILL — JULUCA 50-25 MG TAB: 50-25 | 30 days supply | Qty: 30 | Fill #1

## 2019-01-24 MED FILL — ZIDOVUDINE 300 MG TABLET: 300 | 30 days supply | Qty: 30 | Fill #1

## 2019-01-24 NOTE — ED Notes (Signed)
Patient verbalizes understanding of discharge instructions. Opportunity for questioning and answers were provided. Armband removed by staff, pt discharged from ED.  

## 2019-01-26 DIAGNOSIS — N186 End stage renal disease: Secondary | ICD-10-CM | POA: Diagnosis not present

## 2019-01-26 DIAGNOSIS — N2581 Secondary hyperparathyroidism of renal origin: Secondary | ICD-10-CM | POA: Diagnosis not present

## 2019-01-26 DIAGNOSIS — D509 Iron deficiency anemia, unspecified: Secondary | ICD-10-CM | POA: Diagnosis not present

## 2019-01-26 DIAGNOSIS — D631 Anemia in chronic kidney disease: Secondary | ICD-10-CM | POA: Diagnosis not present

## 2019-01-27 ENCOUNTER — Ambulatory Visit (INDEPENDENT_AMBULATORY_CARE_PROVIDER_SITE_OTHER): Payer: Medicare Other | Admitting: Primary Care

## 2019-01-27 ENCOUNTER — Encounter (INDEPENDENT_AMBULATORY_CARE_PROVIDER_SITE_OTHER): Payer: Self-pay | Admitting: Primary Care

## 2019-01-27 ENCOUNTER — Other Ambulatory Visit: Payer: Self-pay

## 2019-01-27 VITALS — BP 182/83 | HR 86 | Temp 98.6°F | Ht 69.0 in | Wt 253.6 lb

## 2019-01-27 DIAGNOSIS — I10 Essential (primary) hypertension: Secondary | ICD-10-CM

## 2019-01-27 DIAGNOSIS — K219 Gastro-esophageal reflux disease without esophagitis: Secondary | ICD-10-CM | POA: Diagnosis not present

## 2019-01-27 DIAGNOSIS — Z09 Encounter for follow-up examination after completed treatment for conditions other than malignant neoplasm: Secondary | ICD-10-CM | POA: Diagnosis not present

## 2019-01-27 DIAGNOSIS — G4733 Obstructive sleep apnea (adult) (pediatric): Secondary | ICD-10-CM | POA: Diagnosis not present

## 2019-01-27 DIAGNOSIS — F17210 Nicotine dependence, cigarettes, uncomplicated: Secondary | ICD-10-CM

## 2019-01-27 DIAGNOSIS — R0602 Shortness of breath: Secondary | ICD-10-CM | POA: Diagnosis not present

## 2019-01-27 DIAGNOSIS — R0683 Snoring: Secondary | ICD-10-CM | POA: Diagnosis not present

## 2019-01-27 NOTE — Progress Notes (Signed)
Acute Office Visit  Subjective:    Patient ID: Darrell Johnson, adult    DOB: Dec 21, 1965, 53 y.o.   MRN: [id number]  Chief Complaint  Patient presents with  . Hospitalization Follow-up    shortness of breath     HPI Patient is in today for to establish after a emergency room visit shortness of breath , nasal congestion . He has a past history of heart failure, end-stage renal disease, asthma/, COPD.  Today he denies shortness of breath, headaches, chest pain or lower extremity edema  Past Medical History:  Diagnosis Date  . Anemia   . Anxiety   . Arthritis    "knees" (04/13/2017)  . Asthma   . CHF (congestive heart failure) ([place])   . ESRD (end stage renal disease) on dialysis [facility])    "MWF; [facility]" (04/13/2017)  . Gout, unspecified 08/13/2009   Qualifier: Diagnosis of  By: Redmond Pulling  MD, Mateo Flow    . H1N1 influenza    March 2016  . History of blood transfusion 02/2017   "related to OR"  . HIV infection ([place]) 1980's   on ART therapy since, followed by ID clinic, complicated  by neuropathy  . Hyperlipidemia    hypertrygliceridemia determined ti be secondary to ART therpay  . HYPERTENSION 05/08/2006  . Pneumonia    "once; years ago" (04/13/2017)  . Rib fractures 01/2009  . Seizures ([place])    last seizure was in the 1990s, pt has family history of seizures; "probably related to alcohol" (04/13/2017)  . 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 Syphilis 1997   history of syphilis 1997  . Tobacco abuse     Past Surgical History:  Procedure Laterality Date  . AV FISTULA PLACEMENT Right 12/02/2013   Procedure: ARTERIOVENOUS (AV) FISTULA CREATION;  Surgeon: Rosetta Posner, MD;  Location: [place];  Service: Vascular;  Laterality: Right;  . INCISION AND DRAINAGE ABSCESS Right 03/09/2017   Procedure: INCISION AND DRAINAGE Right Scrotal  Abscess;  Surgeon: Franchot Gallo, MD;  Location: [place];  Service: Urology;  Laterality: Right;  . THROMBECTOMY Right ~ 2016   "AV fistula clotted off"    Family History  Problem Relation Age of Onset  . Cancer Mother   . Hypertension Mother   . COPD Father   . Hypertension Father   . Diabetes Sister   . Hypertension Sister   . Diabetes Brother   . Hypertension Brother   . Stroke Neg Hx     Social History   Socioeconomic History  . Marital status: Married    Spouse name: Not on file  . Number of children: Not on file  . Years of education: Not on file  . Highest education level: Not on file  Occupational History  . Occupation: details cars  Social Needs  . Financial resource strain: Not on file  . Food insecurity    Worry: Not on file    Inability: Not on file  . Transportation needs    Medical: Not on file    Non-medical: Not on file  Tobacco Use  . Smoking status: Current Some Day Smoker    Packs/day: 0.10    Years: 25.00    Pack years: 2.50    Types: Cigarettes    Last attempt to quit: 10/19/2017    Years since quitting: 1.2  .  Smokeless tobacco: Never Used  . Tobacco comment: "quitting"  Substance and Sexual Activity  . Alcohol use: No    Alcohol/week: 0.0 standard drinks    Comment: 04/13/2017 "quit ~ 2014"  . Drug use: No  . Sexual activity: Never    Partners: Female    Comment: given condoms  Lifestyle  . Physical activity    Days per week: Not on file    Minutes per session: Not on file  . Stress: Not on file  Relationships  . Social Herbalist on phone: Not on file    Gets together: Not on file    Attends religious service: Not on file    Active member of club or organization: Not on file    Attends meetings of clubs or organizations: Not on file    Relationship status: Not on file  . Intimate partner violence    Fear of current or ex partner: Not on file    Emotionally abused: Not on file    Physically abused: Not on file     Forced sexual activity: Not on file  Other Topics Concern  . Not on file  Social History Narrative   ** Merged History Encounter **        No facility-administered medications prior to visit.    Outpatient Medications Prior to Visit  Medication Sig Dispense Refill  . amitriptyline (ELAVIL) 25 MG tablet Take 1 tablet (25 mg total) by mouth at bedtime. 30 tablet 3  . amLODipine (NORVASC) 10 MG tablet Take 1 tablet (10 mg total) by mouth daily. 90 tablet 3  . azelastine (OPTIVAR) 0.05 % ophthalmic solution INSTILL 1 DROP INTO EACH EYE TWICE A DAY    . budesonide-formoterol (SYMBICORT) 160-4.5 MCG/ACT inhaler Inhale 2 puffs into the lungs 2 (two) times daily. 1 Inhaler 5  . carvedilol (COREG) 25 MG tablet Take 1 tablet (25 mg total) by mouth 2 (two) times daily with a meal. 180 tablet 3  . Dolutegravir-Rilpivirine (JULUCA) 50-25 MG TABS Take 1 tablet by mouth daily with supper. 30 tablet 1  . doxazosin (CARDURA) 8 MG tablet Take 1 tablet (8 mg total) by mouth at bedtime. 90 tablet 3  . ferric citrate (AURYXIA) 1 GM 210 MG(Fe) tablet Take 630 mg by mouth 3 (three) times daily with meals. And 2 tablets (420mg ) with a snack    . furosemide (LASIX) 40 MG tablet TAKE 1 TABLET (40 MG TOTAL) BY MOUTH DAILY AS NEEDED FOR FLUID. 90 tablet 0  . lisinopril (ZESTRIL) 10 MG tablet Take 10 mg by mouth daily.    . montelukast (SINGULAIR) 10 MG tablet TAKE 1 TABLET BY MOUTH EVERY DAY AT NIGHT    . NICOTROL 10 MG inhaler BEGIN 4 WEEKS BEFORE YOUR QUIT SMOKING DAY. INHALE MULTIPLE SMALL PUFFS INTO MOUTH TO CONTROL URGES    . pravastatin (PRAVACHOL) 40 MG tablet TAKE 1 TABLET BY MOUTH EVERYDAY AT BEDTIME (Patient taking differently: Take 40 mg by mouth daily. ) 90 tablet 0  . albuterol (PROVENTIL HFA;VENTOLIN HFA) 108 (90 Base) MCG/ACT inhaler Inhale 2 puffs into the lungs every 4 (four) hours as needed for up to 30 days for wheezing or shortness of breath. 1 Inhaler 2  . buPROPion (WELLBUTRIN SR) 100 MG 12 hr  tablet Take 1 tablet (100 mg total) by mouth 2 (two) times daily. (Patient not taking: Reported on 01/27/2019) 60 tablet 6  . fluticasone (FLONASE) 50 MCG/ACT nasal spray SPRAY 1 SPRAY INTO BOTH  NOSTRILS ONCE A DAY AS NEEDED    . hydrALAZINE (APRESOLINE) 100 MG tablet TAKE 1/2 TABLET BY MOUTH 3 TIMES A DAY    . ipratropium-albuterol (DUONEB) 0.5-2.5 (3) MG/3ML SOLN Take 3 mLs by nebulization every 6 (six) hours as needed. (Patient not taking: Reported on 01/27/2019) 360 mL 1  . loratadine (CLARITIN) 10 MG tablet Take 1 tablet (10 mg total) by mouth daily. (Patient taking differently: Take 10 mg by mouth daily as needed for allergies. ) 30 tablet 11  . nicotine (NICODERM CQ) 7 mg/24hr patch Place 1 patch (7 mg total) onto the skin daily. 28 patch 0  . predniSONE (DELTASONE) 20 MG tablet Take 2 tablets (40 mg total) by mouth daily for 4 days. 8 tablet 0  . zidovudine (RETROVIR) 300 MG tablet Take 1 tablet (300 mg total) by mouth daily. 30 tablet 1    No Known Allergies  ROS     Objective:    Physical Exam  BP (!) 182/83 (BP Location: Left Arm, Patient Position: Sitting, Cuff Size: Large)   Pulse 86   Temp 98.6 F (37 C) (Tympanic)   Ht 5\' 9"  (1.753 m)   Wt 253 lb 9.6 oz (115 kg)   SpO2 98%   BMI 37.45 kg/m  Wt Readings from Last 3 Encounters:  01/31/19 246 lb 4.1 oz (111.7 kg)  01/27/19 253 lb 9.6 oz (115 kg)  01/23/19 251 lb 15.8 oz (114.3 kg)    Health Maintenance Due  Topic Date Due  . PAP SMEAR-Modifier  05/14/1986  . MAMMOGRAM  05/15/2015    There are no preventive care reminders to display for this patient.   Lab Results  Component Value Date   TSH 1.214 07/06/2013   Lab Results  Component Value Date   WBC 7.2 01/31/2019   HGB 8.7 (L) 01/31/2019   HCT 26.5 (L) 01/31/2019   MCV 103.9 (H) 01/31/2019   PLT 131 (L) 01/31/2019   Lab Results  Component Value Date   NA 138 01/31/2019   K 3.4 (L) 01/31/2019   CO2 26 01/31/2019   GLUCOSE 89 01/31/2019   BUN 90 (H)  01/31/2019   CREATININE 13.60 (H) 01/31/2019   BILITOT 1.3 (H) 01/13/2019   ALKPHOS 70 01/13/2019   AST 43 (H) 01/13/2019   ALT 22 01/13/2019   PROT 7.2 01/13/2019   ALBUMIN 3.8 01/13/2019   CALCIUM 8.0 (L) 01/31/2019   ANIONGAP 17 (H) 01/31/2019   Lab Results  Component Value Date   CHOL 121 (L) 09/24/2015   Lab Results  Component Value Date   HDL 40 09/24/2015   Lab Results  Component Value Date   LDLCALC 61 09/24/2015   Lab Results  Component Value Date   TRIG 99 09/24/2015   Lab Results  Component Value Date   CHOLHDL 3.0 09/24/2015   No results found for: HGBA1C     Assessment & Plan:   Problem List Items Addressed This Visit    None    Visit Diagnoses    Shortness of breath    -  Primary   Relevant Orders   Ambulatory referral to Pulmonology   Nocturnal polysomnography (NPSG)   OSA (obstructive sleep apnea)       Relevant Orders   Nocturnal polysomnography (NPSG)   Snoring       Relevant Orders   Nocturnal polysomnography (NPSG)   Gastroesophageal reflux disease, esophagitis presence not specified         [place] was seen today  for hospitalization follow-up.  Diagnoses and all orders for this visit:  Shortness of breath O2 on room air is 98% his BMI 37 indicating obesity- weight can also   -     Ambulatory referral to Pulmonology -     Nocturnal polysomnography (NPSG); Future  OSA (obstructive sleep apnea)  -     Nocturnal polysomnography (NPSG); Future  Snoring/ differential diagnosis  Patient explains when he sleeps he wakes up coughing which can be associated with acid reflux other times he is awaken from sleeping by trying to catch his breath. -     Nocturnal polysomnography (NPSG); Future  Gastroesophageal reflux disease, esophagitis presence not specified Will treat with omeprazole and discussed eating small frequent meal, reduction in acidic foods, fried foods spicy foods, alcohol caffeine and tobacco and certain medications.Avoid  laying down after eating 30 mins-1 hour, elevated head of the bed.   No orders of the defined types were placed in this encounter.    Kerin Perna, NP

## 2019-01-27 NOTE — Patient Instructions (Signed)
Sleep Apnea Sleep apnea is a condition in which breathing pauses or becomes shallow during sleep. Episodes of sleep apnea usually last 10 seconds or longer, and they may occur as many as 20 times an hour. Sleep apnea disrupts your sleep and keeps your body from getting the rest that it needs. This condition can increase your risk of certain health problems, including:  Heart attack.  Stroke.  Obesity.  Diabetes.  Heart failure.  Irregular heartbeat. What are the causes? There are three kinds of sleep apnea:  Obstructive sleep apnea. This kind is caused by a blocked or collapsed airway.  Central sleep apnea. This kind happens when the part of the brain that controls breathing does not send the correct signals to the muscles that control breathing.  Mixed sleep apnea. This is a combination of obstructive and central sleep apnea. The most common cause of this condition is a collapsed or blocked airway. An airway can collapse or become blocked if:  Your throat muscles are abnormally relaxed.  Your tongue and tonsils are larger than normal.  You are overweight.  Your airway is smaller than normal. What increases the risk? You are more likely to develop this condition if you:  Are overweight.  Smoke.  Have a smaller than normal airway.  Are elderly.  Are male.  Drink alcohol.  Take sedatives or tranquilizers.  Have a family history of sleep apnea. What are the signs or symptoms? Symptoms of this condition include:  Trouble staying asleep.  Daytime sleepiness and tiredness.  Irritability.  Loud snoring.  Morning headaches.  Trouble concentrating.  Forgetfulness.  Decreased interest in sex.  Unexplained sleepiness.  Mood swings.  Personality changes.  Feelings of depression.  Waking up often during the night to urinate.  Dry mouth.  Sore throat. How is this diagnosed? This condition may be diagnosed with:  A medical history.  A physical  exam.  A series of tests that are done while you are sleeping (sleep study). These tests are usually done in a sleep lab, but they may also be done at home. How is this treated? Treatment for this condition aims to restore normal breathing and to ease symptoms during sleep. It may involve managing health issues that can affect breathing, such as high blood pressure or obesity. Treatment may include:  Sleeping on your side.  Using a decongestant if you have nasal congestion.  Avoiding the use of depressants, including alcohol, sedatives, and narcotics.  Losing weight if you are overweight.  Making changes to your diet.  Quitting smoking.  Using a device to open your airway while you sleep, such as: ? An oral appliance. This is a custom-made mouthpiece that shifts your lower jaw forward. ? A continuous positive airway pressure (CPAP) device. This device blows air through a mask when you breathe out (exhale). ? A nasal expiratory positive airway pressure (EPAP) device. This device has valves that you put into each nostril. ? A bi-level positive airway pressure (BPAP) device. This device blows air through a mask when you breathe in (inhale) and breathe out (exhale).  Having surgery if other treatments do not work. During surgery, excess tissue is removed to create a wider airway. It is important to get treatment for sleep apnea. Without treatment, this condition can lead to:  High blood pressure.  Coronary artery disease.  In men, an inability to achieve or maintain an erection (impotence).  Reduced thinking abilities. Follow these instructions at home: Lifestyle  Make any lifestyle changes   that your health care provider recommends.  Eat a healthy, well-balanced diet.  Take steps to lose weight if you are overweight.  Avoid using depressants, including alcohol, sedatives, and narcotics.  Do not use any products that contain nicotine or tobacco, such as cigarettes,  e-cigarettes, and chewing tobacco. If you need help quitting, ask your health care provider. General instructions  Take over-the-counter and prescription medicines only as told by your health care provider.  If you were given a device to open your airway while you sleep, use it only as told by your health care provider.  If you are having surgery, make sure to tell your health care provider you have sleep apnea. You may need to bring your device with you.  Keep all follow-up visits as told by your health care provider. This is important. Contact a health care provider if:  The device that you received to open your airway during sleep is uncomfortable or does not seem to be working.  Your symptoms do not improve.  Your symptoms get worse. Get help right away if:  You develop: ? Chest pain. ? Shortness of breath. ? Discomfort in your back, arms, or stomach.  You have: ? Trouble speaking. ? Weakness on one side of your body. ? Drooping in your face. These symptoms may represent a serious problem that is an emergency. Do not wait to see if the symptoms will go away. Get medical help right away. Call your local emergency services (911 in the [place] Do not drive yourself to the hospital. Summary  Sleep apnea is a condition in which breathing pauses or becomes shallow during sleep.  The most common cause is a collapsed or blocked airway.  The goal of treatment is to restore normal breathing and to ease symptoms during sleep. This information is not intended to replace advice given to you by your health care provider. Make sure you discuss any questions you have with your health care provider. Document Released: 06/27/2002 Document Revised: 04/23/2018 Document Reviewed: 03/02/2018 Elsevier Patient Education  2020 [employer].

## 2019-01-28 DIAGNOSIS — D631 Anemia in chronic kidney disease: Secondary | ICD-10-CM | POA: Diagnosis not present

## 2019-01-28 DIAGNOSIS — N186 End stage renal disease: Secondary | ICD-10-CM | POA: Diagnosis not present

## 2019-01-28 DIAGNOSIS — N2581 Secondary hyperparathyroidism of renal origin: Secondary | ICD-10-CM | POA: Diagnosis not present

## 2019-01-28 DIAGNOSIS — D509 Iron deficiency anemia, unspecified: Secondary | ICD-10-CM | POA: Diagnosis not present

## 2019-01-31 ENCOUNTER — Non-Acute Institutional Stay (HOSPITAL_COMMUNITY)
Admission: EM | Admit: 2019-01-31 | Discharge: 2019-01-31 | Disposition: A | Discharge status: Home / Self Care | Payer: Medicare Other | Attending: Emergency Medicine | Admitting: Emergency Medicine

## 2019-01-31 ENCOUNTER — Other Ambulatory Visit: Payer: Self-pay

## 2019-01-31 ENCOUNTER — Emergency Department (HOSPITAL_COMMUNITY): Payer: Medicare Other

## 2019-01-31 ENCOUNTER — Encounter (HOSPITAL_COMMUNITY): Payer: Self-pay | Admitting: Emergency Medicine

## 2019-01-31 DIAGNOSIS — R0689 Other abnormalities of breathing: Secondary | ICD-10-CM | POA: Diagnosis not present

## 2019-01-31 DIAGNOSIS — Z8249 Family history of ischemic heart disease and other diseases of the circulatory system: Secondary | ICD-10-CM | POA: Insufficient documentation

## 2019-01-31 DIAGNOSIS — I132 Hypertensive heart and chronic kidney disease with heart failure and with stage 5 chronic kidney disease, or end stage renal disease: Secondary | ICD-10-CM | POA: Insufficient documentation

## 2019-01-31 DIAGNOSIS — J45909 Unspecified asthma, uncomplicated: Secondary | ICD-10-CM | POA: Diagnosis not present

## 2019-01-31 DIAGNOSIS — B20 Human immunodeficiency virus [HIV] disease: Secondary | ICD-10-CM | POA: Insufficient documentation

## 2019-01-31 DIAGNOSIS — F17210 Nicotine dependence, cigarettes, uncomplicated: Secondary | ICD-10-CM | POA: Diagnosis not present

## 2019-01-31 DIAGNOSIS — J811 Chronic pulmonary edema: Secondary | ICD-10-CM | POA: Diagnosis present

## 2019-01-31 DIAGNOSIS — I509 Heart failure, unspecified: Secondary | ICD-10-CM | POA: Diagnosis not present

## 2019-01-31 DIAGNOSIS — M1990 Unspecified osteoarthritis, unspecified site: Secondary | ICD-10-CM | POA: Insufficient documentation

## 2019-01-31 DIAGNOSIS — G8929 Other chronic pain: Secondary | ICD-10-CM | POA: Insufficient documentation

## 2019-01-31 DIAGNOSIS — D631 Anemia in chronic kidney disease: Secondary | ICD-10-CM | POA: Insufficient documentation

## 2019-01-31 DIAGNOSIS — N186 End stage renal disease: Secondary | ICD-10-CM | POA: Insufficient documentation

## 2019-01-31 DIAGNOSIS — Z79899 Other long term (current) drug therapy: Secondary | ICD-10-CM | POA: Diagnosis not present

## 2019-01-31 DIAGNOSIS — I10 Essential (primary) hypertension: Secondary | ICD-10-CM | POA: Diagnosis not present

## 2019-01-31 DIAGNOSIS — R0602 Shortness of breath: Secondary | ICD-10-CM | POA: Diagnosis not present

## 2019-01-31 DIAGNOSIS — I120 Hypertensive chronic kidney disease with stage 5 chronic kidney disease or end stage renal disease: Secondary | ICD-10-CM | POA: Diagnosis not present

## 2019-01-31 DIAGNOSIS — Z7951 Long term (current) use of inhaled steroids: Secondary | ICD-10-CM | POA: Diagnosis not present

## 2019-01-31 DIAGNOSIS — F419 Anxiety disorder, unspecified: Secondary | ICD-10-CM | POA: Diagnosis not present

## 2019-01-31 DIAGNOSIS — Z1159 Encounter for screening for other viral diseases: Secondary | ICD-10-CM | POA: Diagnosis not present

## 2019-01-31 DIAGNOSIS — Z20828 Contact with and (suspected) exposure to other viral communicable diseases: Secondary | ICD-10-CM | POA: Diagnosis not present

## 2019-01-31 DIAGNOSIS — J810 Acute pulmonary edema: Secondary | ICD-10-CM | POA: Diagnosis not present

## 2019-01-31 DIAGNOSIS — R0902 Hypoxemia: Secondary | ICD-10-CM | POA: Diagnosis not present

## 2019-01-31 DIAGNOSIS — Z209 Contact with and (suspected) exposure to unspecified communicable disease: Secondary | ICD-10-CM | POA: Diagnosis not present

## 2019-01-31 DIAGNOSIS — E785 Hyperlipidemia, unspecified: Secondary | ICD-10-CM | POA: Diagnosis not present

## 2019-01-31 DIAGNOSIS — Z992 Dependence on renal dialysis: Secondary | ICD-10-CM | POA: Insufficient documentation

## 2019-01-31 LAB — CBC WITH DIFFERENTIAL/PLATELET
Abs Immature Granulocytes: 0.05 10*3/uL (ref 0.00–0.07)
Basophils Absolute: 0 10*3/uL (ref 0.0–0.1)
Basophils Relative: 1 %
Eosinophils Absolute: 0.2 10*3/uL (ref 0.0–0.5)
Eosinophils Relative: 2 %
HCT: 26.5 % — ABNORMAL LOW (ref 39.0–52.0)
Hemoglobin: 8.7 g/dL — ABNORMAL LOW (ref 13.0–17.0)
Immature Granulocytes: 1 %
Lymphocytes Relative: 12 %
Lymphs Abs: 0.9 10*3/uL (ref 0.7–4.0)
MCH: 34.1 pg — ABNORMAL HIGH (ref 26.0–34.0)
MCHC: 32.8 g/dL (ref 30.0–36.0)
MCV: 103.9 fL — ABNORMAL HIGH (ref 80.0–100.0)
Monocytes Absolute: 0.6 10*3/uL (ref 0.1–1.0)
Monocytes Relative: 9 %
Neutro Abs: 5.5 10*3/uL (ref 1.7–7.7)
Neutrophils Relative %: 75 %
Platelets: 131 10*3/uL — ABNORMAL LOW (ref 150–400)
RBC: 2.55 MIL/uL — ABNORMAL LOW (ref 4.22–5.81)
RDW: 15.9 % — ABNORMAL HIGH (ref 11.5–15.5)
WBC: 7.2 10*3/uL (ref 4.0–10.5)
nRBC: 0 % (ref 0.0–0.2)

## 2019-01-31 LAB — BASIC METABOLIC PANEL
Anion gap: 17 — ABNORMAL HIGH (ref 5–15)
BUN: 90 mg/dL — ABNORMAL HIGH (ref 6–20)
CO2: 26 mmol/L (ref 22–32)
Calcium: 8 mg/dL — ABNORMAL LOW (ref 8.9–10.3)
Chloride: 95 mmol/L — ABNORMAL LOW (ref 98–111)
Creatinine, Ser: 13.6 mg/dL — ABNORMAL HIGH (ref 0.61–1.24)
GFR calc Af Amer: 4 mL/min — ABNORMAL LOW (ref >60)
GFR calc non Af Amer: 4 mL/min — ABNORMAL LOW (ref >60)
Glucose, Bld: 89 mg/dL (ref 70–99)
Potassium: 3.4 mmol/L — ABNORMAL LOW (ref 3.5–5.1)
Sodium: 138 mmol/L (ref 135–145)

## 2019-01-31 LAB — SARS CORONAVIRUS 2 BY RT PCR (HOSPITAL ORDER, PERFORMED IN ~~LOC~~ HOSPITAL LAB): SARS Coronavirus 2: NEGATIVE

## 2019-01-31 MED ORDER — HEPARIN SODIUM (PORCINE) 1000 UNIT/ML IJ SOLN
INTRAMUSCULAR | Status: AC
  Administered 2019-01-31: 6000 [IU] via INTRAVENOUS_CENTRAL
  Filled 2019-01-31: qty 6

## 2019-01-31 MED ORDER — CHLORHEXIDINE GLUCONATE CLOTH 2 % EX PADS: 6.0000 | MEDICATED_PAD | Freq: Every day | CUTANEOUS | Status: DC

## 2019-01-31 MED ORDER — FAMOTIDINE 40 MG PO TABS: 40.0000 mg | ORAL_TABLET | Freq: Every day | ORAL | 3 refills | Status: DC

## 2019-01-31 MED ORDER — HEPARIN SODIUM (PORCINE) 1000 UNIT/ML DIALYSIS
6000.0000 [IU] | INTRAMUSCULAR | Status: AC | PRN
  Administered 2019-01-31: 6000 [IU] via INTRAVENOUS_CENTRAL

## 2019-01-31 NOTE — Progress Notes (Signed)
Pt discharged home after HD tx in stable condition. Wheeled down to the ED waiting area with no issue

## 2019-01-31 NOTE — ED Triage Notes (Signed)
Pt began having shob of breath this morning. Hx of asthma and dialysis pt (Mon, Wed, Friday)negative Pt took neb tx at home before calling EMS to see if it would help his shob, but it did not. Given 4 nitro by EMS, rales heard. Initial BP 200/110, 96 HR, and 100% on NBR. Rales gone after nitro.

## 2019-01-31 NOTE — ED Notes (Signed)
Report given to dialysis RN

## 2019-01-31 NOTE — Progress Notes (Signed)
RT was able to take patient off of bipap and he is currently on room air with an O2 sat between 97-100. Pt is tolerating well at this time.

## 2019-01-31 NOTE — ED Notes (Signed)
Pt placed on bipap by respiratory  

## 2019-01-31 NOTE — Progress Notes (Signed)
RT placed patient on BIPAP. Patient is tolerating current settings well. RT will continue to monitor.

## 2019-01-31 NOTE — Progress Notes (Signed)
Renal Navigator notified OP HD clinic/[facility] of patient's negative COVID 19 test result and that he is receiving HD in the hospital this morning in order to provide continuity of care.  Alphonzo Cruise, [place] Renal Navigator [phone]

## 2019-01-31 NOTE — Procedures (Signed)
Patient was seen on dialysis and the procedure was supervised.  BFR 400  Via AVF BP is  167/79.   Patient appears to be tolerating treatment well.  Fells better.  Thinks may have lost weight - have increased his UF goal to 4500 on machine.  Plan is still to let him be discharged after HD if possible   Louis Meckel 01/31/2019

## 2019-01-31 NOTE — ED Provider Notes (Signed)
[facility] EMERGENCY DEPARTMENT Provider Note   CSN: [id number] Arrival date & time: 01/31/19  0547     History   Chief Complaint No chief complaint on file.   HPI Darrell Johnson is a 53 y.o. adult.     The history is provided by the patient and medical records.    53 y.o. M with hx of anemia, anxiety, CHF, ESRD on HD (MWF), HTN, seizures, presenting to the ED for SOB.  States he felt SOB all day yesterday, attempted doing home neb treatments several times without relief.  EMS was called and placed on NRB but remained labored.  He was given NTG and seemed to improve but patient denies feeling better.  He remains on NRB on arrival.  He is due to dialysis this morning, had full treatment on Friday as scheduled.  Recent COVID test on 01/23/19 that was negative.  Past Medical History:  Diagnosis Date  . Anemia   . Anxiety   . Arthritis    "knees" (04/13/2017)  . Asthma   . CHF (congestive heart failure) ([place])   . ESRD (end stage renal disease) on dialysis [facility])    "MWF; [facility]" (04/13/2017)  . Gout, unspecified 08/13/2009   Qualifier: Diagnosis of  By: Redmond Pulling  MD, Mateo Flow    . H1N1 influenza    March 2016  . History of blood transfusion 02/2017   "related to OR"  . HIV infection ([place]) 1980's   on ART therapy since, followed by ID clinic, complicated  by neuropathy  . Hyperlipidemia    hypertrygliceridemia determined ti be secondary to ART therpay  . HYPERTENSION 05/08/2006  . Pneumonia    "once; years ago" (04/13/2017)  . Rib fractures 01/2009  . Seizures ([place])    last seizure was in the 1990s, pt has family history of seizures; "probably related to alcohol" (04/13/2017)  . 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 Syphilis 1997   history of syphilis 1997  . Tobacco abuse     Patient Active Problem List   Diagnosis Date Noted  . Moderate mitral regurgitation   . Acute respiratory failure with hypoxia ([place]) 03/29/2017  . Trigger finger of right hand 09/11/2016  . Hyperlipidemia 03/31/2016  . Anxiety state 03/11/2016  . Lumbar radiculopathy 01/28/2016  . Seasonal allergies   . Healthcare maintenance 05/03/2015  . Anemia in chronic kidney disease 08/30/2014  . ESRD on dialysis ([place]) 12/27/2013  . History of syphilis 11/07/2013  . Arthritis 09/09/2013  . Tobacco abuse 09/07/2013  . Chronic pain disorder 04/28/2013  . Gout 08/13/2009  . HIV disease ([place]) 05/08/2006  . Essential hypertension 05/08/2006  . SEIZURE DISORDER 05/08/2006    Past Surgical History:  Procedure Laterality Date  . AV FISTULA PLACEMENT Right 12/02/2013   Procedure: ARTERIOVENOUS (AV) FISTULA CREATION;  Surgeon: Rosetta Posner, MD;  Location: [facility];  Service: Vascular;  Laterality: Right;  . INCISION AND DRAINAGE ABSCESS Right 03/09/2017   Procedure: INCISION AND DRAINAGE Right Scrotal Abscess;  Surgeon: Franchot Gallo, MD;  Location: [facility];  Service: Urology;  Laterality: Right;  . THROMBECTOMY Right ~ 2016   "AV fistula clotted off"        Home Medications    Prior to Admission medications   Medication Sig Start Date End Date Taking? Authorizing Provider  albuterol (PROVENTIL HFA;VENTOLIN  HFA) 108 (90 Base) MCG/ACT inhaler Inhale 2 puffs into the lungs every 4 (four) hours as needed for up to 30 days for wheezing or shortness of breath. 09/21/18 01/03/19  Kerin Perna, NP  amitriptyline (ELAVIL) 25 MG tablet Take 1 tablet (25 mg total) by mouth at bedtime. 01/19/19   Carlyle Basques, MD  amLODipine (NORVASC) 10 MG tablet Take 1 tablet (10 mg total) by mouth daily. 09/21/18   Kerin Perna, NP  azelastine (OPTIVAR) 0.05 % ophthalmic solution INSTILL 1 DROP INTO EACH EYE TWICE A DAY 12/24/18   [provider]  budesonide-formoterol (SYMBICORT) 160-4.5 MCG/ACT inhaler Inhale 2 puffs into the lungs  2 (two) times daily. 09/21/18   Kerin Perna, NP  buPROPion (WELLBUTRIN SR) 100 MG 12 hr tablet Take 1 tablet (100 mg total) by mouth 2 (two) times daily. Patient not taking: Reported on 01/27/2019 01/19/19   Carlyle Basques, MD  carvedilol (COREG) 25 MG tablet Take 1 tablet (25 mg total) by mouth 2 (two) times daily with a meal. 09/21/18   Kerin Perna, NP  Dolutegravir-Rilpivirine (JULUCA) 50-25 MG TABS Take 1 tablet by mouth daily with supper. 12/29/18   Kuppelweiser, Cassie L, RPH-CPP  doxazosin (CARDURA) 8 MG tablet Take 1 tablet (8 mg total) by mouth at bedtime. 09/21/18   Kerin Perna, NP  ferric citrate (AURYXIA) 1 GM 210 MG(Fe) tablet Take 630 mg by mouth 3 (three) times daily with meals. And 2 tablets (420mg ) with a snack    [provider]  fluticasone (FLONASE) 50 MCG/ACT nasal spray SPRAY 1 SPRAY INTO BOTH NOSTRILS ONCE A DAY AS NEEDED 09/23/18   [provider]  furosemide (LASIX) 40 MG tablet TAKE 1 TABLET (40 MG TOTAL) BY MOUTH DAILY AS NEEDED FOR FLUID. 12/14/18   Kerin Perna, NP  hydrALAZINE (APRESOLINE) 100 MG tablet TAKE 1/2 TABLET BY MOUTH 3 TIMES A DAY 12/01/18   [provider]  ipratropium-albuterol (DUONEB) 0.5-2.5 (3) MG/3ML SOLN Take 3 mLs by nebulization every 6 (six) hours as needed. Patient not taking: Reported on 01/27/2019 09/21/18   Kerin Perna, NP  lisinopril (ZESTRIL) 10 MG tablet Take 10 mg by mouth daily. 09/06/18   [provider]  montelukast (SINGULAIR) 10 MG tablet TAKE 1 TABLET BY MOUTH EVERY DAY AT NIGHT 11/16/18   [provider]  NICOTROL 10 MG inhaler BEGIN 4 WEEKS BEFORE YOUR QUIT SMOKING DAY. INHALE MULTIPLE SMALL PUFFS INTO MOUTH TO CONTROL URGES 01/18/19   [provider]  pravastatin (PRAVACHOL) 40 MG tablet TAKE 1 TABLET BY MOUTH EVERYDAY AT BEDTIME Patient taking differently: Take 40 mg by mouth daily.  12/16/18   Kerin Perna, NP    Family History Family History   Problem Relation Age of Onset  . Cancer Mother   . Hypertension Mother   . COPD Father   . Hypertension Father   . Diabetes Sister   . Hypertension Sister   . Diabetes Brother   . Hypertension Brother   . Stroke Neg Hx     Social History Social History   Tobacco Use  . Smoking status: Current Some Day Smoker    Packs/day: 0.10    Years: 25.00    Pack years: 2.50    Types: Cigarettes    Last attempt to quit: 10/19/2017    Years since quitting: 1.2  . Smokeless tobacco: Never Used  . Tobacco comment: "quitting"  Substance Use Topics  . Alcohol use: No  Alcohol/week: 0.0 standard drinks    Comment: 04/13/2017 "quit ~ 2014"  . Drug use: No     Allergies   Patient has no known allergies.   Review of Systems Review of Systems  Respiratory: Positive for shortness of breath.   All other systems reviewed and are negative.    Physical Exam Updated Vital Signs BP (!) 171/90   Pulse 80   Resp (!) 21   SpO2 100%   Physical Exam Vitals signs and nursing note reviewed.  Constitutional:      Appearance: He is well-developed.  HENT:     Head: Normocephalic and atraumatic.  Eyes:     Conjunctiva/sclera: Conjunctivae normal.     Pupils: Pupils are equal, round, and reactive to light.  Neck:     Musculoskeletal: Normal range of motion.  Cardiovascular:     Rate and Rhythm: Normal rate and regular rhythm.     Heart sounds: Normal heart sounds.  Pulmonary:     Effort: Pulmonary effort is normal.     Breath sounds: Wheezing and rales present.     Comments: Increased work of breathing, tripoding, rales and intermixed wheezes, speaking in short 2-3 word phrases through NRB Abdominal:     General: Bowel sounds are normal.     Palpations: Abdomen is soft.  Musculoskeletal: Normal range of motion.     Comments: Fistula right forearm, no signs of infection, thrill noted  Skin:    General: Skin is warm and dry.  Neurological:     Mental Status: He is alert and oriented  to person, place, and time.      ED Treatments / Results  Labs (all labs ordered are listed, but only abnormal results are displayed) Labs Reviewed  BASIC METABOLIC PANEL - Abnormal; Notable for the following components:      Result Value   Potassium 3.4 (*)    Chloride 95 (*)    BUN 90 (*)    Creatinine, Ser 13.60 (*)    Calcium 8.0 (*)    GFR calc non Af Amer 4 (*)    GFR calc Af Amer 4 (*)    Anion gap 17 (*)    All other components within normal limits  SARS CORONAVIRUS 2 (HOSPITAL ORDER, [place])  CBC WITH DIFFERENTIAL/PLATELET    EKG None  Radiology Dg Chest Port 1 View  Result Date: 01/31/2019 CLINICAL DATA:  Shortness of breath EXAM: PORTABLE CHEST 1 VIEW COMPARISON:  01/23/2019 FINDINGS: Borderline cardiomegaly.  Stable mediastinal contours. Airspace opacity asymmetric to the right where there is chronic pleural thickening in the setting of prior rib fractures. No acute pleural effusion. No pneumothorax. IMPRESSION: 1. Bilateral airspace disease which could be edema or infection. 2. Right fibrothorax. Electronically Signed   By: Monte Fantasia M.D.   On: 01/31/2019 06:43    Procedures Procedures (including critical care time)  CRITICAL CARE Performed by: Larene Pickett   Total critical care time: 35 minutes  Critical care time was exclusive of separately billable procedures and treating other patients.  Critical care was necessary to treat or prevent imminent or life-threatening deterioration.  Critical care was time spent personally by me on the following activities: development of treatment plan with patient and/or surrogate as well as nursing, discussions with consultants, evaluation of patient's response to treatment, examination of patient, obtaining history from patient or surrogate, ordering and performing treatments and interventions, ordering and review of laboratory studies, ordering and review of radiographic  studies,  pulse oximetry and re-evaluation of patient's condition.   Medications Ordered in ED Medications - No data to display   Initial Impression / Assessment and Plan / ED Course  I have reviewed the triage vital signs and the nursing notes.  Pertinent labs & imaging results that were available during my care of the patient were reviewed by me and considered in my medical decision making (see chart for details).  53 year old male here with shortness of breath.  Has history of CHF as well as ESRD on HD.  He is due for treatment in the morning.  He is afebrile and nontoxic.  Lungs with intermixed wheezes and rails.  He is tripoding with increased work of breathing, speaking in very short, 2-3 word phrases.  Concern for fluid overload.  Patient had COVID test in the past week that was negative.  He was started on BiPAP.  6:30 AM Tolerating bipap well, less distressed.  CXR with signs of vascular congestion/edema.  K+ WNL.  Will discuss with nephrology to help arrange dialysis.  6:36 AM Spoke with Dr. Jonnie Finner-- will arrange for dialysis this AM.  Does not feel he will require admission, can likely d/c after treatment.  Final Clinical Impressions(s) / ED Diagnoses   Final diagnoses:  Acute pulmonary edema ([place])  ESRD (end stage renal disease) on dialysis [facility])    ED Discharge Orders    None       Larene Pickett, PA-C 01/31/19 1194    Randal Buba, April, MD 01/31/19 [phone]

## 2019-02-01 ENCOUNTER — Telehealth: Payer: Self-pay

## 2019-02-01 DIAGNOSIS — F17209 Nicotine dependence, unspecified, with unspecified nicotine-induced disorders: Secondary | ICD-10-CM | POA: Diagnosis not present

## 2019-02-01 NOTE — Telephone Encounter (Signed)
Claiborne Billings with nicotine cessation program is calling to make sure Dr Baxter Flattery is aware Darrell Johnson is taking Wellbutrin and has a history of seizure disorder.  She has starter Elberta Fortis on a nicotine inhaler.   If [name] would like to speak with Claiborne Billings: [place], RN

## 2019-02-02 DIAGNOSIS — D509 Iron deficiency anemia, unspecified: Secondary | ICD-10-CM | POA: Diagnosis not present

## 2019-02-02 DIAGNOSIS — D631 Anemia in chronic kidney disease: Secondary | ICD-10-CM | POA: Diagnosis not present

## 2019-02-02 DIAGNOSIS — N186 End stage renal disease: Secondary | ICD-10-CM | POA: Diagnosis not present

## 2019-02-02 DIAGNOSIS — N2581 Secondary hyperparathyroidism of renal origin: Secondary | ICD-10-CM | POA: Diagnosis not present

## 2019-02-04 DIAGNOSIS — D509 Iron deficiency anemia, unspecified: Secondary | ICD-10-CM | POA: Diagnosis not present

## 2019-02-04 DIAGNOSIS — N186 End stage renal disease: Secondary | ICD-10-CM | POA: Diagnosis not present

## 2019-02-04 DIAGNOSIS — N2581 Secondary hyperparathyroidism of renal origin: Secondary | ICD-10-CM | POA: Diagnosis not present

## 2019-02-04 DIAGNOSIS — D631 Anemia in chronic kidney disease: Secondary | ICD-10-CM | POA: Diagnosis not present

## 2019-02-07 DIAGNOSIS — D509 Iron deficiency anemia, unspecified: Secondary | ICD-10-CM | POA: Diagnosis not present

## 2019-02-07 DIAGNOSIS — D631 Anemia in chronic kidney disease: Secondary | ICD-10-CM | POA: Diagnosis not present

## 2019-02-07 DIAGNOSIS — N2581 Secondary hyperparathyroidism of renal origin: Secondary | ICD-10-CM | POA: Diagnosis not present

## 2019-02-07 DIAGNOSIS — N186 End stage renal disease: Secondary | ICD-10-CM | POA: Diagnosis not present

## 2019-02-08 ENCOUNTER — Encounter (HOSPITAL_COMMUNITY): Payer: Self-pay

## 2019-02-08 ENCOUNTER — Other Ambulatory Visit: Payer: Self-pay

## 2019-02-08 ENCOUNTER — Emergency Department (HOSPITAL_COMMUNITY)
Admission: EM | Admit: 2019-02-08 | Discharge: 2019-02-08 | Disposition: A | Discharge status: Home / Self Care | Payer: Medicare Other | Attending: Emergency Medicine | Admitting: Emergency Medicine

## 2019-02-08 ENCOUNTER — Emergency Department (HOSPITAL_COMMUNITY): Payer: Medicare Other

## 2019-02-08 DIAGNOSIS — Z79899 Other long term (current) drug therapy: Secondary | ICD-10-CM | POA: Diagnosis not present

## 2019-02-08 DIAGNOSIS — J441 Chronic obstructive pulmonary disease with (acute) exacerbation: Secondary | ICD-10-CM | POA: Insufficient documentation

## 2019-02-08 DIAGNOSIS — I10 Essential (primary) hypertension: Secondary | ICD-10-CM | POA: Diagnosis not present

## 2019-02-08 DIAGNOSIS — B20 Human immunodeficiency virus [HIV] disease: Secondary | ICD-10-CM | POA: Diagnosis not present

## 2019-02-08 DIAGNOSIS — R062 Wheezing: Secondary | ICD-10-CM | POA: Diagnosis not present

## 2019-02-08 DIAGNOSIS — I132 Hypertensive heart and chronic kidney disease with heart failure and with stage 5 chronic kidney disease, or end stage renal disease: Secondary | ICD-10-CM | POA: Insufficient documentation

## 2019-02-08 DIAGNOSIS — N186 End stage renal disease: Secondary | ICD-10-CM | POA: Diagnosis not present

## 2019-02-08 DIAGNOSIS — Z992 Dependence on renal dialysis: Secondary | ICD-10-CM | POA: Insufficient documentation

## 2019-02-08 DIAGNOSIS — F17210 Nicotine dependence, cigarettes, uncomplicated: Secondary | ICD-10-CM | POA: Diagnosis not present

## 2019-02-08 DIAGNOSIS — I509 Heart failure, unspecified: Secondary | ICD-10-CM | POA: Diagnosis not present

## 2019-02-08 DIAGNOSIS — R079 Chest pain, unspecified: Secondary | ICD-10-CM | POA: Diagnosis not present

## 2019-02-08 DIAGNOSIS — R0602 Shortness of breath: Secondary | ICD-10-CM | POA: Diagnosis not present

## 2019-02-08 DIAGNOSIS — R0902 Hypoxemia: Secondary | ICD-10-CM | POA: Diagnosis not present

## 2019-02-08 LAB — CBC WITH DIFFERENTIAL/PLATELET
Abs Immature Granulocytes: 0.03 10*3/uL (ref 0.00–0.07)
Basophils Absolute: 0 10*3/uL (ref 0.0–0.1)
Basophils Relative: 1 %
Eosinophils Absolute: 0.2 10*3/uL (ref 0.0–0.5)
Eosinophils Relative: 2 %
HCT: 27.6 % — ABNORMAL LOW (ref 39.0–52.0)
Hemoglobin: 8.9 g/dL — ABNORMAL LOW (ref 13.0–17.0)
Immature Granulocytes: 1 %
Lymphocytes Relative: 12 %
Lymphs Abs: 0.8 10*3/uL (ref 0.7–4.0)
MCH: 33.7 pg (ref 26.0–34.0)
MCHC: 32.2 g/dL (ref 30.0–36.0)
MCV: 104.5 fL — ABNORMAL HIGH (ref 80.0–100.0)
Monocytes Absolute: 0.6 10*3/uL (ref 0.1–1.0)
Monocytes Relative: 10 %
Neutro Abs: 4.9 10*3/uL (ref 1.7–7.7)
Neutrophils Relative %: 74 %
Platelets: 153 10*3/uL (ref 150–400)
RBC: 2.64 MIL/uL — ABNORMAL LOW (ref 4.22–5.81)
RDW: 15.7 % — ABNORMAL HIGH (ref 11.5–15.5)
WBC: 6.6 10*3/uL (ref 4.0–10.5)
nRBC: 0 % (ref 0.0–0.2)

## 2019-02-08 LAB — BASIC METABOLIC PANEL
Anion gap: 14 (ref 5–15)
BUN: 36 mg/dL — ABNORMAL HIGH (ref 6–20)
CO2: 28 mmol/L (ref 22–32)
Calcium: 8.8 mg/dL — ABNORMAL LOW (ref 8.9–10.3)
Chloride: 96 mmol/L — ABNORMAL LOW (ref 98–111)
Creatinine, Ser: 9.46 mg/dL — ABNORMAL HIGH (ref 0.61–1.24)
GFR calc Af Amer: 7 mL/min — ABNORMAL LOW (ref >60)
GFR calc non Af Amer: 6 mL/min — ABNORMAL LOW (ref >60)
Glucose, Bld: 82 mg/dL (ref 70–99)
Potassium: 4 mmol/L (ref 3.5–5.1)
Sodium: 138 mmol/L (ref 135–145)

## 2019-02-08 LAB — TROPONIN I (HIGH SENSITIVITY): Troponin I (High Sensitivity): 23 ng/L — ABNORMAL HIGH (ref <18)

## 2019-02-08 LAB — BRAIN NATRIURETIC PEPTIDE: B Natriuretic Peptide: 648.2 pg/mL — ABNORMAL HIGH (ref 0.0–100.0)

## 2019-02-08 MED ORDER — ALBUTEROL SULFATE HFA 108 (90 BASE) MCG/ACT IN AERS
6.0000 | INHALATION_SPRAY | Freq: Once | RESPIRATORY_TRACT | Status: AC
  Administered 2019-02-08: 6 via RESPIRATORY_TRACT
  Filled 2019-02-08: qty 6.7

## 2019-02-08 MED ORDER — AZITHROMYCIN 250 MG PO TABS: 250.0000 mg | ORAL_TABLET | Freq: Every day | ORAL | 0 refills | Status: DC

## 2019-02-08 MED ORDER — PREDNISONE 20 MG PO TABS: 40.0000 mg | ORAL_TABLET | Freq: Every day | ORAL | 0 refills | Status: DC

## 2019-02-08 NOTE — ED Notes (Signed)
Patient verbalizes understanding of discharge instructions. Opportunity for questioning and answers were provided. Armband removed by staff, pt discharged from ED.  

## 2019-02-08 NOTE — ED Triage Notes (Signed)
Patient is shob x 1 day, had dialysis yesterday feels like this is unrelated to fluid status. No fever, no NVD, has had a headache but feels this is related to sinuses.

## 2019-02-08 NOTE — ED Notes (Signed)
Pt transported to xray 

## 2019-02-08 NOTE — ED Provider Notes (Signed)
[facility] EMERGENCY DEPARTMENT Provider Note   CSN: [id number] Arrival date & time: 02/08/19  1027    History   Chief Complaint Chief Complaint  Patient presents with  . Shortness of Breath    HPI Darrell Johnson is a 53 y.o. adult.     HPI  53 year old male presents with shortness of breath.  Started yesterday and got better after dialysis but then recurred again today.  Often happens at night.  No significant cough but he has chronic postnasal drip that sometimes makes him cough.  Some chest pain earlier in the day but none now.  No edema and he states he does not feel like he is fluid overloaded since he had a full dialysis session yesterday.  This is a recurrent issue for him.  He tried some albuterol with no relief. No current chest pain.  Past Medical History:  Diagnosis Date  . Anemia   . Anxiety   . Arthritis    "knees" (04/13/2017)  . Asthma   . CHF (congestive heart failure) ([place])   . ESRD (end stage renal disease) on dialysis [facility])    "MWF; [facility]" (04/13/2017)  . Gout, unspecified 08/13/2009   Qualifier: Diagnosis of  By: Redmond Pulling  MD, Mateo Flow    . H1N1 influenza    March 2016  . History of blood transfusion 02/2017   "related to OR"  . HIV infection ([place]) 1980's   on ART therapy since, followed by ID clinic, complicated  by neuropathy  . Hyperlipidemia    hypertrygliceridemia determined ti be secondary to ART therpay  . HYPERTENSION 05/08/2006  . Pneumonia    "once; years ago" (04/13/2017)  . Rib fractures 01/2009  . Seizures ([place])    last seizure was in the 1990s, pt has family history of seizures; "probably related to alcohol" (04/13/2017)  . 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 Syphilis 1997   history of syphilis 1997  . Tobacco abuse     Patient Active Problem List   Diagnosis Date  Noted  . Pulmonary edema 01/31/2019  . Moderate mitral regurgitation   . Acute respiratory failure with hypoxia ([place]) 03/29/2017  . Trigger finger of right hand 09/11/2016  . Hyperlipidemia 03/31/2016  . Anxiety state 03/11/2016  . Lumbar radiculopathy 01/28/2016  . Seasonal allergies   . Healthcare maintenance 05/03/2015  . Anemia in chronic kidney disease 08/30/2014  . ESRD on dialysis ([place]) 12/27/2013  . History of syphilis 11/07/2013  . Arthritis 09/09/2013  . Tobacco abuse 09/07/2013  . Chronic pain disorder 04/28/2013  . Gout 08/13/2009  . HIV disease ([place]) 05/08/2006  . Essential hypertension 05/08/2006  . SEIZURE DISORDER 05/08/2006    Past Surgical History:  Procedure Laterality Date  . AV FISTULA PLACEMENT Right 12/02/2013   Procedure: ARTERIOVENOUS (AV) FISTULA CREATION;  Surgeon: Rosetta Posner, MD;  Location: [facility];  Service: Vascular;  Laterality: Right;  . INCISION AND DRAINAGE ABSCESS Right 03/09/2017   Procedure: INCISION AND DRAINAGE Right Scrotal Abscess;  Surgeon: Franchot Gallo, MD;  Location: [facility];  Service: Urology;  Laterality: Right;  . THROMBECTOMY Right ~ 2016   "AV fistula clotted off"        Home Medications    Prior to Admission medications   Medication Sig Start Date End Date Taking? Authorizing Provider  albuterol (PROVENTIL  HFA;VENTOLIN HFA) 108 (90 Base) MCG/ACT inhaler Inhale 2 puffs into the lungs every 4 (four) hours as needed for up to 30 days for wheezing or shortness of breath. 09/21/18 02/08/19 Yes Kerin Perna, NP  amitriptyline (ELAVIL) 25 MG tablet Take 1 tablet (25 mg total) by mouth at bedtime. Patient taking differently: Take 25 mg by mouth as needed for sleep.  01/19/19  Yes Carlyle Basques, MD  amLODipine (NORVASC) 10 MG tablet Take 1 tablet (10 mg total) by mouth daily. 09/21/18  Yes Kerin Perna, NP  azelastine (OPTIVAR) 0.05 % ophthalmic solution Place 1 drop into both eyes daily.  12/24/18  Yes [provider]  budesonide-formoterol (SYMBICORT) 160-4.5 MCG/ACT inhaler Inhale 2 puffs into the lungs 2 (two) times daily. 09/21/18  Yes Kerin Perna, NP  carvedilol (COREG) 25 MG tablet Take 1 tablet (25 mg total) by mouth 2 (two) times daily with a meal. 09/21/18  Yes Edwards, Milford Cage, NP  Dolutegravir-Rilpivirine (JULUCA) 50-25 MG TABS Take 1 tablet by mouth daily with supper. 12/29/18  Yes Kuppelweiser, Cassie L, RPH-CPP  doxazosin (CARDURA) 8 MG tablet Take 1 tablet (8 mg total) by mouth at bedtime. 09/21/18  Yes Kerin Perna, NP  ferric citrate (AURYXIA) 1 GM 210 MG(Fe) tablet Take 420 mg by mouth 3 (three) times daily with meals.   Yes [provider]  fluticasone (FLONASE) 50 MCG/ACT nasal spray Place 1 spray into both nostrils as needed for allergies.  09/23/18  Yes [provider]  furosemide (LASIX) 40 MG tablet TAKE 1 TABLET (40 MG TOTAL) BY MOUTH DAILY AS NEEDED FOR FLUID. 12/14/18  Yes Kerin Perna, NP  hydrALAZINE (APRESOLINE) 100 MG tablet Take 100 mg by mouth 3 (three) times daily.  12/01/18  Yes [provider]  lisinopril (ZESTRIL) 10 MG tablet Take 10 mg by mouth daily. 09/06/18  Yes [provider]  montelukast (SINGULAIR) 10 MG tablet Take 10 mg by mouth at bedtime.  11/16/18  Yes [provider]  NICOTROL 10 MG inhaler Inhale 1 continuous puffing into the lungs as needed for smoking cessation.  01/18/19  Yes [provider]  pravastatin (PRAVACHOL) 40 MG tablet TAKE 1 TABLET BY MOUTH EVERYDAY AT BEDTIME Patient taking differently: Take 40 mg by mouth daily.  12/16/18  Yes Kerin Perna, NP  azithromycin (ZITHROMAX) 250 MG tablet Take 1 tablet (250 mg total) by mouth daily. Take first 2 tablets together, then 1 every day until finished. 02/08/19   Sherwood Gambler, MD  buPROPion [facility] SR) 100 MG 12 hr tablet Take 1 tablet (100 mg total) by mouth 2 (two) times daily. Patient not taking: Reported on 01/27/2019 01/19/19    Carlyle Basques, MD  famotidine (PEPCID) 40 MG tablet Take 1 tablet (40 mg total) by mouth daily. Patient not taking: Reported on 02/08/2019 01/31/19   Kerin Perna, NP  ipratropium-albuterol (DUONEB) 0.5-2.5 (3) MG/3ML SOLN Take 3 mLs by nebulization every 6 (six) hours as needed. Patient not taking: Reported on 02/08/2019 09/21/18   Kerin Perna, NP  predniSONE (DELTASONE) 20 MG tablet Take 2 tablets (40 mg total) by mouth daily. 02/08/19   Sherwood Gambler, MD    Family History Family History  Problem Relation Age of Onset  . Cancer Mother   . Hypertension Mother   . COPD Father   . Hypertension Father   . Diabetes Sister   . Hypertension Sister   . Diabetes Brother   . Hypertension Brother   .  Stroke Neg Hx     Social History Social History   Tobacco Use  . Smoking status: Current Some Day Smoker    Packs/day: 0.10    Years: 25.00    Pack years: 2.50    Types: Cigarettes    Last attempt to quit: 10/19/2017    Years since quitting: 1.3  . Smokeless tobacco: Never Used  . Tobacco comment: "quitting"  Substance Use Topics  . Alcohol use: No    Alcohol/week: 0.0 standard drinks    Comment: 04/13/2017 "quit ~ 2014"  . Drug use: No     Allergies   Patient has no known allergies.   Review of Systems Review of Systems  Constitutional: Negative for fever.  Respiratory: Positive for cough and shortness of breath.   Cardiovascular: Positive for chest pain. Negative for leg swelling.  All other systems reviewed and are negative.    Physical Exam Updated Vital Signs BP (!) 183/91 (BP Location: Left Arm)   Pulse 75 Comment: Simultaneous filing. User may not have seen previous data.  Temp 98.5 F (36.9 C) (Oral)   Resp 19 Comment: Simultaneous filing. User may not have seen previous data.  Ht 5\' 9"  (1.753 m)   Wt 105.2 kg   SpO2 99% Comment: Simultaneous filing. User may not have seen previous data.  BMI 34.26 kg/m   Physical Exam Vitals signs and  nursing note reviewed.  Constitutional:      General: He is not in acute distress.    Appearance: He is well-developed. He is not ill-appearing or diaphoretic.  HENT:     Head: Normocephalic and atraumatic.     Right Ear: External ear normal.     Left Ear: External ear normal.     Nose: Nose normal.  Eyes:     General:        Right eye: No discharge.        Left eye: No discharge.  Cardiovascular:     Rate and Rhythm: Normal rate and regular rhythm.     Heart sounds: Normal heart sounds.  Pulmonary:     Effort: Pulmonary effort is normal.     Breath sounds: Wheezing (diffuse, expiratory) present.  Abdominal:     Palpations: Abdomen is soft.     Tenderness: There is no abdominal tenderness.  Musculoskeletal:     Right lower leg: No edema.     Left lower leg: No edema.  Skin:    General: Skin is warm and dry.  Neurological:     Mental Status: He is alert.  Psychiatric:        Mood and Affect: Mood is not anxious.      ED Treatments / Results  Labs (all labs ordered are listed, but only abnormal results are displayed) Labs Reviewed  BASIC METABOLIC PANEL - Abnormal; Notable for the following components:      Result Value   Chloride 96 (*)    BUN 36 (*)    Creatinine, Ser 9.46 (*)    Calcium 8.8 (*)    GFR calc non Af Amer 6 (*)    GFR calc Af Amer 7 (*)    All other components within normal limits  CBC WITH DIFFERENTIAL/PLATELET - Abnormal; Notable for the following components:   RBC 2.64 (*)    Hemoglobin 8.9 (*)    HCT 27.6 (*)    MCV 104.5 (*)    RDW 15.7 (*)    All other components within normal limits  BRAIN  NATRIURETIC PEPTIDE - Abnormal; Notable for the following components:   B Natriuretic Peptide 648.2 (*)    All other components within normal limits  TROPONIN I (HIGH SENSITIVITY) - Abnormal; Notable for the following components:   Troponin I (High Sensitivity) 23 (*)    All other components within normal limits    EKG EKG Interpretation   Date/Time:  Tuesday February 08 2019 10:44:46 EDT Ventricular Rate:  80 PR Interval:    QRS Duration: 99 QT Interval:  417 QTC Calculation: 482 R Axis:   70 Text Interpretation:  Age not entered, assumed to be  53 years old for purpose of ECG interpretation Sinus rhythm Left atrial enlargement RSR' in V1 or V2, right VCD or RVH ST elev, probable normal early repol pattern Borderline prolonged QT interval no significant change since January 31 2019 Confirmed by Sherwood Gambler [phone]) on 02/08/2019 10:59:46 AM   Radiology Dg Chest 2 View  Result Date: 02/08/2019 CLINICAL DATA:  Shortness of breath since yesterday. EXAM: CHEST - 2 VIEW COMPARISON:  01/31/2019. FINDINGS: Mildly enlarged cardiac silhouette with an interval decrease in size. Decreased prominence of the pulmonary vasculature and interstitial markings. Small amount of residual right pleural thickening/fluid. Stable linear atelectasis or scarring in the right mid and lower lung zones. Mild thoracic spine degenerative changes. IMPRESSION: 1. Improved changes of congestive heart failure. 2. Small amount of residual right pleural thickening/fluid. 3. Stable linear atelectasis or scarring in the right mid and lower lung zones. Electronically Signed   By: Claudie Revering M.D.   On: 02/08/2019 11:54    Procedures Procedures (including critical care time)  Medications Ordered in ED Medications  albuterol (VENTOLIN HFA) 108 (90 Base) MCG/ACT inhaler 6 puff (6 puffs Inhalation Given 02/08/19 1211)     Initial Impression / Assessment and Plan / ED Course  I have reviewed the triage vital signs and the nursing notes.  Pertinent labs & imaging results that were available during my care of the patient were reviewed by me and considered in my medical decision making (see chart for details).        Patient is feeling better after albuterol.  I think his shortness of breath is a combination of COPD and some pulmonary edema.  However he is not hypoxic  or in distress.  Troponin is minimally elevated in gray zone but similar to previous testing.  Doubt ACS or PE or dissection.  I discussed he should probably be taking the Lasix daily as opposed to as needed.  Otherwise he feels comfortable with going home and given this could be COPD exacerbation I will treat with prednisone and antibiotics.  Doubt COVID given recent negative testing.  Darrell Johnson was evaluated in Emergency Department on 02/08/2019 for the symptoms described in the history of present illness. He was evaluated in the context of the global COVID-19 pandemic, which necessitated consideration that the patient might be at risk for infection with the SARS-CoV-2 virus that causes COVID-19. Institutional protocols and algorithms that pertain to the evaluation of patients at risk for COVID-19 are in a state of rapid change based on information released by regulatory bodies including the CDC and federal and state organizations. These policies and algorithms were followed during the patient's care in the ED.   Final Clinical Impressions(s) / ED Diagnoses   Final diagnoses:  COPD exacerbation [facility])    ED Discharge Orders         Ordered    predniSONE (DELTASONE) 20 MG tablet  Daily     02/08/19 1303    azithromycin (ZITHROMAX) 250 MG tablet  Daily     02/08/19 1303           Sherwood Gambler, MD 02/08/19 1535

## 2019-02-09 ENCOUNTER — Other Ambulatory Visit (INDEPENDENT_AMBULATORY_CARE_PROVIDER_SITE_OTHER): Payer: Self-pay | Admitting: Primary Care

## 2019-02-09 DIAGNOSIS — N186 End stage renal disease: Secondary | ICD-10-CM | POA: Diagnosis not present

## 2019-02-09 DIAGNOSIS — N2581 Secondary hyperparathyroidism of renal origin: Secondary | ICD-10-CM | POA: Diagnosis not present

## 2019-02-09 DIAGNOSIS — D509 Iron deficiency anemia, unspecified: Secondary | ICD-10-CM | POA: Diagnosis not present

## 2019-02-09 DIAGNOSIS — D631 Anemia in chronic kidney disease: Secondary | ICD-10-CM | POA: Diagnosis not present

## 2019-02-10 ENCOUNTER — Other Ambulatory Visit: Payer: Self-pay | Admitting: Internal Medicine

## 2019-02-11 DIAGNOSIS — D631 Anemia in chronic kidney disease: Secondary | ICD-10-CM | POA: Diagnosis not present

## 2019-02-11 DIAGNOSIS — D509 Iron deficiency anemia, unspecified: Secondary | ICD-10-CM | POA: Diagnosis not present

## 2019-02-11 DIAGNOSIS — N186 End stage renal disease: Secondary | ICD-10-CM | POA: Diagnosis not present

## 2019-02-11 DIAGNOSIS — N2581 Secondary hyperparathyroidism of renal origin: Secondary | ICD-10-CM | POA: Diagnosis not present

## 2019-02-13 DIAGNOSIS — R0689 Other abnormalities of breathing: Secondary | ICD-10-CM | POA: Diagnosis not present

## 2019-02-13 DIAGNOSIS — R231 Pallor: Secondary | ICD-10-CM | POA: Diagnosis not present

## 2019-02-13 DIAGNOSIS — R0902 Hypoxemia: Secondary | ICD-10-CM | POA: Diagnosis not present

## 2019-02-13 DIAGNOSIS — R0602 Shortness of breath: Secondary | ICD-10-CM | POA: Diagnosis not present

## 2019-02-13 DIAGNOSIS — R062 Wheezing: Secondary | ICD-10-CM | POA: Diagnosis not present

## 2019-02-14 ENCOUNTER — Inpatient Hospital Stay (HOSPITAL_COMMUNITY): Payer: Medicare Other

## 2019-02-14 ENCOUNTER — Observation Stay (HOSPITAL_COMMUNITY)
Admission: EM | Admit: 2019-02-14 | Discharge: 2019-02-15 | Disposition: A | Discharge status: Home / Self Care | Payer: Medicare Other | Attending: Internal Medicine | Admitting: Internal Medicine

## 2019-02-14 ENCOUNTER — Other Ambulatory Visit: Payer: Self-pay

## 2019-02-14 ENCOUNTER — Encounter (HOSPITAL_COMMUNITY): Payer: Self-pay | Admitting: Oncology

## 2019-02-14 ENCOUNTER — Emergency Department (HOSPITAL_COMMUNITY): Payer: Medicare Other

## 2019-02-14 DIAGNOSIS — N189 Chronic kidney disease, unspecified: Secondary | ICD-10-CM | POA: Diagnosis present

## 2019-02-14 DIAGNOSIS — M109 Gout, unspecified: Secondary | ICD-10-CM | POA: Diagnosis not present

## 2019-02-14 DIAGNOSIS — J9601 Acute respiratory failure with hypoxia: Principal | ICD-10-CM | POA: Diagnosis present

## 2019-02-14 DIAGNOSIS — I509 Heart failure, unspecified: Secondary | ICD-10-CM | POA: Diagnosis not present

## 2019-02-14 DIAGNOSIS — Z1159 Encounter for screening for other viral diseases: Secondary | ICD-10-CM | POA: Diagnosis not present

## 2019-02-14 DIAGNOSIS — R569 Unspecified convulsions: Secondary | ICD-10-CM | POA: Diagnosis not present

## 2019-02-14 DIAGNOSIS — N186 End stage renal disease: Secondary | ICD-10-CM | POA: Diagnosis not present

## 2019-02-14 DIAGNOSIS — I132 Hypertensive heart and chronic kidney disease with heart failure and with stage 5 chronic kidney disease, or end stage renal disease: Secondary | ICD-10-CM | POA: Insufficient documentation

## 2019-02-14 DIAGNOSIS — I5033 Acute on chronic diastolic (congestive) heart failure: Secondary | ICD-10-CM | POA: Diagnosis present

## 2019-02-14 DIAGNOSIS — Z7951 Long term (current) use of inhaled steroids: Secondary | ICD-10-CM | POA: Insufficient documentation

## 2019-02-14 DIAGNOSIS — Z720 Tobacco use: Secondary | ICD-10-CM | POA: Diagnosis present

## 2019-02-14 DIAGNOSIS — I340 Nonrheumatic mitral (valve) insufficiency: Secondary | ICD-10-CM | POA: Diagnosis not present

## 2019-02-14 DIAGNOSIS — E669 Obesity, unspecified: Secondary | ICD-10-CM | POA: Insufficient documentation

## 2019-02-14 DIAGNOSIS — E785 Hyperlipidemia, unspecified: Secondary | ICD-10-CM | POA: Diagnosis not present

## 2019-02-14 DIAGNOSIS — K219 Gastro-esophageal reflux disease without esophagitis: Secondary | ICD-10-CM | POA: Diagnosis present

## 2019-02-14 DIAGNOSIS — I10 Essential (primary) hypertension: Secondary | ICD-10-CM | POA: Diagnosis not present

## 2019-02-14 DIAGNOSIS — Z79899 Other long term (current) drug therapy: Secondary | ICD-10-CM | POA: Diagnosis not present

## 2019-02-14 DIAGNOSIS — J811 Chronic pulmonary edema: Secondary | ICD-10-CM | POA: Diagnosis present

## 2019-02-14 DIAGNOSIS — Z992 Dependence on renal dialysis: Secondary | ICD-10-CM | POA: Insufficient documentation

## 2019-02-14 DIAGNOSIS — I120 Hypertensive chronic kidney disease with stage 5 chronic kidney disease or end stage renal disease: Secondary | ICD-10-CM | POA: Diagnosis not present

## 2019-02-14 DIAGNOSIS — J90 Pleural effusion, not elsewhere classified: Secondary | ICD-10-CM | POA: Diagnosis not present

## 2019-02-14 DIAGNOSIS — F329 Major depressive disorder, single episode, unspecified: Secondary | ICD-10-CM | POA: Diagnosis not present

## 2019-02-14 DIAGNOSIS — F418 Other specified anxiety disorders: Secondary | ICD-10-CM | POA: Insufficient documentation

## 2019-02-14 DIAGNOSIS — J810 Acute pulmonary edema: Secondary | ICD-10-CM | POA: Diagnosis not present

## 2019-02-14 DIAGNOSIS — I517 Cardiomegaly: Secondary | ICD-10-CM | POA: Diagnosis not present

## 2019-02-14 DIAGNOSIS — Z6835 Body mass index (BMI) 35.0-35.9, adult: Secondary | ICD-10-CM | POA: Diagnosis not present

## 2019-02-14 DIAGNOSIS — F17210 Nicotine dependence, cigarettes, uncomplicated: Secondary | ICD-10-CM | POA: Insufficient documentation

## 2019-02-14 DIAGNOSIS — F32A Depression, unspecified: Secondary | ICD-10-CM | POA: Diagnosis present

## 2019-02-14 DIAGNOSIS — I361 Nonrheumatic tricuspid (valve) insufficiency: Secondary | ICD-10-CM

## 2019-02-14 DIAGNOSIS — M1990 Unspecified osteoarthritis, unspecified site: Secondary | ICD-10-CM | POA: Insufficient documentation

## 2019-02-14 DIAGNOSIS — D631 Anemia in chronic kidney disease: Secondary | ICD-10-CM | POA: Diagnosis present

## 2019-02-14 DIAGNOSIS — B20 Human immunodeficiency virus [HIV] disease: Secondary | ICD-10-CM | POA: Diagnosis not present

## 2019-02-14 DIAGNOSIS — R778 Other specified abnormalities of plasma proteins: Secondary | ICD-10-CM | POA: Diagnosis present

## 2019-02-14 DIAGNOSIS — Z20828 Contact with and (suspected) exposure to other viral communicable diseases: Secondary | ICD-10-CM | POA: Diagnosis not present

## 2019-02-14 DIAGNOSIS — R7989 Other specified abnormal findings of blood chemistry: Secondary | ICD-10-CM | POA: Diagnosis present

## 2019-02-14 DIAGNOSIS — E875 Hyperkalemia: Secondary | ICD-10-CM | POA: Diagnosis not present

## 2019-02-14 LAB — BASIC METABOLIC PANEL
Anion gap: 16 — ABNORMAL HIGH (ref 5–15)
BUN: 70 mg/dL — ABNORMAL HIGH (ref 6–20)
CO2: 27 mmol/L (ref 22–32)
Calcium: 8.6 mg/dL — ABNORMAL LOW (ref 8.9–10.3)
Chloride: 96 mmol/L — ABNORMAL LOW (ref 98–111)
Creatinine, Ser: 13.57 mg/dL — ABNORMAL HIGH (ref 0.61–1.24)
GFR calc Af Amer: 4 mL/min — ABNORMAL LOW (ref >60)
GFR calc non Af Amer: 4 mL/min — ABNORMAL LOW (ref >60)
Glucose, Bld: 82 mg/dL (ref 70–99)
Potassium: 5.4 mmol/L — ABNORMAL HIGH (ref 3.5–5.1)
Sodium: 139 mmol/L (ref 135–145)

## 2019-02-14 LAB — CBC
HCT: 25.5 % — ABNORMAL LOW (ref 39.0–52.0)
HCT: 26 % — ABNORMAL LOW (ref 39.0–52.0)
Hemoglobin: 8.5 g/dL — ABNORMAL LOW (ref 13.0–17.0)
Hemoglobin: 8.4 g/dL — ABNORMAL LOW (ref 13.0–17.0)
MCH: 34.8 pg — ABNORMAL HIGH (ref 26.0–34.0)
MCH: 34.1 pg — ABNORMAL HIGH (ref 26.0–34.0)
MCHC: 33.3 g/dL (ref 30.0–36.0)
MCHC: 32.3 g/dL (ref 30.0–36.0)
MCV: 104.5 fL — ABNORMAL HIGH (ref 80.0–100.0)
MCV: 105.7 fL — ABNORMAL HIGH (ref 80.0–100.0)
Platelets: 132 10*3/uL — ABNORMAL LOW (ref 150–400)
Platelets: 129 10*3/uL — ABNORMAL LOW (ref 150–400)
RBC: 2.44 MIL/uL — ABNORMAL LOW (ref 4.22–5.81)
RBC: 2.46 MIL/uL — ABNORMAL LOW (ref 4.22–5.81)
RDW: 16 % — ABNORMAL HIGH (ref 11.5–15.5)
RDW: 16.2 % — ABNORMAL HIGH (ref 11.5–15.5)
WBC: 8.3 10*3/uL (ref 4.0–10.5)
WBC: 7.9 10*3/uL (ref 4.0–10.5)
nRBC: 0 % (ref 0.0–0.2)
nRBC: 0 % (ref 0.0–0.2)

## 2019-02-14 LAB — TROPONIN I (HIGH SENSITIVITY)
Troponin I (High Sensitivity): 28 ng/L — ABNORMAL HIGH (ref <18)
Troponin I (High Sensitivity): 28 ng/L — ABNORMAL HIGH (ref <18)
Troponin I (High Sensitivity): 34 ng/L — ABNORMAL HIGH (ref <18)
Troponin I (High Sensitivity): 32 ng/L — ABNORMAL HIGH (ref <18)

## 2019-02-14 LAB — CBC WITH DIFFERENTIAL/PLATELET
Abs Immature Granulocytes: 0.11 10*3/uL — ABNORMAL HIGH (ref 0.00–0.07)
Basophils Absolute: 0 10*3/uL (ref 0.0–0.1)
Basophils Relative: 0 %
Eosinophils Absolute: 0.2 10*3/uL (ref 0.0–0.5)
Eosinophils Relative: 2 %
HCT: 28.7 % — ABNORMAL LOW (ref 39.0–52.0)
Hemoglobin: 9.4 g/dL — ABNORMAL LOW (ref 13.0–17.0)
Immature Granulocytes: 1 %
Lymphocytes Relative: 6 %
Lymphs Abs: 0.7 10*3/uL (ref 0.7–4.0)
MCH: 34.3 pg — ABNORMAL HIGH (ref 26.0–34.0)
MCHC: 32.8 g/dL (ref 30.0–36.0)
MCV: 104.7 fL — ABNORMAL HIGH (ref 80.0–100.0)
Monocytes Absolute: 0.7 10*3/uL (ref 0.1–1.0)
Monocytes Relative: 6 %
Neutro Abs: 9.4 10*3/uL — ABNORMAL HIGH (ref 1.7–7.7)
Neutrophils Relative %: 85 %
Platelets: 151 10*3/uL (ref 150–400)
RBC: 2.74 MIL/uL — ABNORMAL LOW (ref 4.22–5.81)
RDW: 16.1 % — ABNORMAL HIGH (ref 11.5–15.5)
WBC: 11.2 10*3/uL — ABNORMAL HIGH (ref 4.0–10.5)
nRBC: 0 % (ref 0.0–0.2)

## 2019-02-14 LAB — MRSA PCR SCREENING: MRSA by PCR: NEGATIVE

## 2019-02-14 LAB — HEMOGLOBIN A1C
Hgb A1c MFr Bld: 5.1 % (ref 4.8–5.6)
Mean Plasma Glucose: 99.67 mg/dL

## 2019-02-14 LAB — ECHOCARDIOGRAM COMPLETE
Height: 69 in
Weight: 3880.1 oz

## 2019-02-14 LAB — LIPID PANEL
Cholesterol: 127 mg/dL (ref 0–200)
HDL: 32 mg/dL — ABNORMAL LOW (ref >40)
LDL Cholesterol: 58 mg/dL (ref 0–99)
Total CHOL/HDL Ratio: 4 RATIO
Triglycerides: 185 mg/dL — ABNORMAL HIGH (ref <150)
VLDL: 37 mg/dL (ref 0–40)

## 2019-02-14 LAB — COMPREHENSIVE METABOLIC PANEL
ALT: 23 U/L (ref 0–44)
AST: 21 U/L (ref 15–41)
Albumin: 3.6 g/dL (ref 3.5–5.0)
Alkaline Phosphatase: 81 U/L (ref 38–126)
Anion gap: 16 — ABNORMAL HIGH (ref 5–15)
BUN: 65 mg/dL — ABNORMAL HIGH (ref 6–20)
CO2: 25 mmol/L (ref 22–32)
Calcium: 8.5 mg/dL — ABNORMAL LOW (ref 8.9–10.3)
Chloride: 98 mmol/L (ref 98–111)
Creatinine, Ser: 13.05 mg/dL — ABNORMAL HIGH (ref 0.61–1.24)
GFR calc Af Amer: 4 mL/min — ABNORMAL LOW (ref >60)
GFR calc non Af Amer: 4 mL/min — ABNORMAL LOW (ref >60)
Glucose, Bld: 96 mg/dL (ref 70–99)
Potassium: 4.6 mmol/L (ref 3.5–5.1)
Sodium: 139 mmol/L (ref 135–145)
Total Bilirubin: 0.9 mg/dL (ref 0.3–1.2)
Total Protein: 6.7 g/dL (ref 6.5–8.1)

## 2019-02-14 LAB — SARS CORONAVIRUS 2 BY RT PCR (HOSPITAL ORDER, PERFORMED IN ~~LOC~~ HOSPITAL LAB): SARS Coronavirus 2: NEGATIVE

## 2019-02-14 LAB — GLUCOSE, CAPILLARY: Glucose-Capillary: 72 mg/dL (ref 70–99)

## 2019-02-14 MED ORDER — MOMETASONE FURO-FORMOTEROL FUM 200-5 MCG/ACT IN AERO
2.0000 | INHALATION_SPRAY | Freq: Two times a day (BID) | RESPIRATORY_TRACT | Status: DC
  Administered 2019-02-14 – 2019-02-15 (×3): 2 via RESPIRATORY_TRACT
  Filled 2019-02-14: qty 8.8

## 2019-02-14 MED ORDER — FERRIC CITRATE 1 GM 210 MG(FE) PO TABS
420.0000 mg | ORAL_TABLET | Freq: Three times a day (TID) | ORAL | Status: DC
  Administered 2019-02-14 – 2019-02-15 (×3): 420 mg via ORAL
  Filled 2019-02-14 (×6): qty 2

## 2019-02-14 MED ORDER — FUROSEMIDE 40 MG PO TABS: 40.0000 mg | ORAL_TABLET | Freq: Every day | ORAL | Status: DC | PRN

## 2019-02-14 MED ORDER — DIPHENHYDRAMINE HCL 25 MG PO CAPS
25.0000 mg | ORAL_CAPSULE | Freq: Once | ORAL | Status: AC
  Administered 2019-02-14: 25 mg via ORAL
  Filled 2019-02-14: qty 1

## 2019-02-14 MED ORDER — AMITRIPTYLINE HCL 50 MG PO TABS
25.0000 mg | ORAL_TABLET | Freq: Every day | ORAL | Status: DC
  Administered 2019-02-14: 25 mg via ORAL
  Filled 2019-02-14: qty 1

## 2019-02-14 MED ORDER — NICOTINE 21 MG/24HR TD PT24
21.0000 mg | MEDICATED_PATCH | Freq: Every day | TRANSDERMAL | Status: DC
  Filled 2019-02-14 (×2): qty 1

## 2019-02-14 MED ORDER — RILPIVIRINE HCL 25 MG PO TABS
25.0000 mg | ORAL_TABLET | Freq: Every day | ORAL | Status: DC
  Administered 2019-02-14: 25 mg via ORAL
  Filled 2019-02-14 (×2): qty 1

## 2019-02-14 MED ORDER — MONTELUKAST SODIUM 10 MG PO TABS
10.0000 mg | ORAL_TABLET | Freq: Every day | ORAL | Status: DC
  Administered 2019-02-14: 10 mg via ORAL
  Filled 2019-02-14: qty 1

## 2019-02-14 MED ORDER — HYDRALAZINE HCL 20 MG/ML IJ SOLN: 5.0000 mg | INTRAMUSCULAR | Status: DC | PRN

## 2019-02-14 MED ORDER — CARVEDILOL 25 MG PO TABS: 25.0000 mg | ORAL_TABLET | Freq: Two times a day (BID) | ORAL | Status: DC

## 2019-02-14 MED ORDER — DOLUTEGRAVIR-RILPIVIRINE 50-25 MG PO TABS: 1.0000 | ORAL_TABLET | Freq: Every day | ORAL | Status: DC

## 2019-02-14 MED ORDER — CARVEDILOL 25 MG PO TABS
25.0000 mg | ORAL_TABLET | Freq: Two times a day (BID) | ORAL | Status: DC
  Administered 2019-02-14 – 2019-02-15 (×3): 25 mg via ORAL
  Filled 2019-02-14 (×3): qty 1

## 2019-02-14 MED ORDER — BUPROPION HCL ER (SR) 100 MG PO TB12
100.0000 mg | ORAL_TABLET | Freq: Two times a day (BID) | ORAL | Status: DC
  Administered 2019-02-14 – 2019-02-15 (×3): 100 mg via ORAL
  Filled 2019-02-14 (×4): qty 1

## 2019-02-14 MED ORDER — ALTEPLASE 2 MG IJ SOLR: 2.0000 mg | Freq: Once | INTRAMUSCULAR | Status: DC | PRN

## 2019-02-14 MED ORDER — PRAVASTATIN SODIUM 40 MG PO TABS
40.0000 mg | ORAL_TABLET | Freq: Every day | ORAL | Status: DC
  Administered 2019-02-14: 40 mg via ORAL
  Filled 2019-02-14: qty 1

## 2019-02-14 MED ORDER — HYDRALAZINE HCL 50 MG PO TABS: 100.0000 mg | ORAL_TABLET | Freq: Three times a day (TID) | ORAL | Status: DC

## 2019-02-14 MED ORDER — LEVALBUTEROL HCL 1.25 MG/0.5ML IN NEBU: 1.2500 mg | INHALATION_SOLUTION | Freq: Four times a day (QID) | RESPIRATORY_TRACT | Status: DC

## 2019-02-14 MED ORDER — SODIUM CHLORIDE 0.9 % IV SOLN: 100.0000 mL | INTRAVENOUS | Status: DC | PRN

## 2019-02-14 MED ORDER — NITROGLYCERIN 0.4 MG SL SUBL
0.4000 mg | SUBLINGUAL_TABLET | SUBLINGUAL | Status: AC | PRN
  Administered 2019-02-14 (×3): 0.4 mg via SUBLINGUAL
  Filled 2019-02-14: qty 1

## 2019-02-14 MED ORDER — DOXAZOSIN MESYLATE 2 MG PO TABS
8.0000 mg | ORAL_TABLET | Freq: Every day | ORAL | Status: DC
  Administered 2019-02-14: 8 mg via ORAL
  Filled 2019-02-14: qty 4

## 2019-02-14 MED ORDER — ONDANSETRON HCL 4 MG PO TABS: 4.0000 mg | ORAL_TABLET | Freq: Four times a day (QID) | ORAL | Status: DC | PRN

## 2019-02-14 MED ORDER — IPRATROPIUM-ALBUTEROL 0.5-2.5 (3) MG/3ML IN SOLN: 3.0000 mL | Freq: Four times a day (QID) | RESPIRATORY_TRACT | Status: DC

## 2019-02-14 MED ORDER — HEPARIN SODIUM (PORCINE) 5000 UNIT/ML IJ SOLN
5000.0000 [IU] | Freq: Three times a day (TID) | INTRAMUSCULAR | Status: DC
  Administered 2019-02-14 – 2019-02-15 (×4): 5000 [IU] via SUBCUTANEOUS
  Filled 2019-02-14 (×4): qty 1

## 2019-02-14 MED ORDER — IPRATROPIUM-ALBUTEROL 0.5-2.5 (3) MG/3ML IN SOLN
3.0000 mL | Freq: Four times a day (QID) | RESPIRATORY_TRACT | Status: DC
  Administered 2019-02-14 – 2019-02-15 (×3): 3 mL via RESPIRATORY_TRACT
  Filled 2019-02-14 (×3): qty 3

## 2019-02-14 MED ORDER — CHLORHEXIDINE GLUCONATE CLOTH 2 % EX PADS
6.0000 | MEDICATED_PAD | Freq: Every day | CUTANEOUS | Status: DC
  Administered 2019-02-14 – 2019-02-15 (×2): 6 via TOPICAL

## 2019-02-14 MED ORDER — AMLODIPINE BESYLATE 10 MG PO TABS
10.0000 mg | ORAL_TABLET | Freq: Every day | ORAL | Status: DC
  Administered 2019-02-14 – 2019-02-15 (×2): 10 mg via ORAL
  Filled 2019-02-14 (×2): qty 1

## 2019-02-14 MED ORDER — FAMOTIDINE 20 MG PO TABS
40.0000 mg | ORAL_TABLET | Freq: Every day | ORAL | Status: DC
  Administered 2019-02-14 – 2019-02-15 (×2): 40 mg via ORAL
  Filled 2019-02-14 (×2): qty 2

## 2019-02-14 MED ORDER — ACETAMINOPHEN 650 MG RE SUPP: 650.0000 mg | Freq: Four times a day (QID) | RECTAL | Status: DC | PRN

## 2019-02-14 MED ORDER — ONDANSETRON HCL 4 MG/2ML IJ SOLN: 4.0000 mg | Freq: Four times a day (QID) | INTRAMUSCULAR | Status: DC | PRN

## 2019-02-14 MED ORDER — ACETAMINOPHEN 325 MG PO TABS: 650.0000 mg | ORAL_TABLET | Freq: Four times a day (QID) | ORAL | Status: DC | PRN

## 2019-02-14 MED ORDER — ASPIRIN EC 81 MG PO TBEC
81.0000 mg | DELAYED_RELEASE_TABLET | Freq: Every day | ORAL | Status: DC
  Administered 2019-02-14 – 2019-02-15 (×2): 81 mg via ORAL
  Filled 2019-02-14 (×2): qty 1

## 2019-02-14 MED ORDER — DOLUTEGRAVIR SODIUM 50 MG PO TABS
50.0000 mg | ORAL_TABLET | Freq: Every day | ORAL | Status: DC
  Administered 2019-02-14: 50 mg via ORAL
  Filled 2019-02-14 (×2): qty 1

## 2019-02-14 MED ORDER — PREDNISONE 20 MG PO TABS
40.0000 mg | ORAL_TABLET | Freq: Every day | ORAL | Status: DC
  Administered 2019-02-14 – 2019-02-15 (×2): 40 mg via ORAL
  Filled 2019-02-14 (×2): qty 2

## 2019-02-14 MED ORDER — HEPARIN SODIUM (PORCINE) 1000 UNIT/ML DIALYSIS: 6000.0000 [IU] | Freq: Once | INTRAMUSCULAR | Status: DC

## 2019-02-14 MED ORDER — DIPHENHYDRAMINE HCL 25 MG PO CAPS
25.0000 mg | ORAL_CAPSULE | Freq: Every evening | ORAL | Status: DC | PRN
  Administered 2019-02-14: 25 mg via ORAL
  Filled 2019-02-14: qty 1

## 2019-02-14 MED ORDER — HYDRALAZINE HCL 50 MG PO TABS
100.0000 mg | ORAL_TABLET | Freq: Three times a day (TID) | ORAL | Status: DC
  Administered 2019-02-14 – 2019-02-15 (×5): 100 mg via ORAL
  Filled 2019-02-14 (×5): qty 2

## 2019-02-14 MED ORDER — AZITHROMYCIN 250 MG PO TABS
250.0000 mg | ORAL_TABLET | Freq: Every day | ORAL | Status: AC
  Administered 2019-02-14 – 2019-02-15 (×2): 250 mg via ORAL
  Filled 2019-02-14 (×2): qty 1

## 2019-02-14 MED ORDER — KETOTIFEN FUMARATE 0.025 % OP SOLN
1.0000 [drp] | Freq: Two times a day (BID) | OPHTHALMIC | Status: DC
  Administered 2019-02-14 – 2019-02-15 (×2): 1 [drp] via OPHTHALMIC
  Filled 2019-02-14: qty 5

## 2019-02-14 MED ORDER — LEVALBUTEROL HCL 1.25 MG/0.5ML IN NEBU
1.2500 mg | INHALATION_SOLUTION | Freq: Four times a day (QID) | RESPIRATORY_TRACT | Status: DC
  Administered 2019-02-14: 1.25 mg via RESPIRATORY_TRACT
  Filled 2019-02-14: qty 0.5

## 2019-02-14 MED ORDER — FLUTICASONE PROPIONATE 50 MCG/ACT NA SUSP: 1.0000 | NASAL | Status: DC | PRN

## 2019-02-14 MED ORDER — LISINOPRIL 10 MG PO TABS
10.0000 mg | ORAL_TABLET | Freq: Every day | ORAL | Status: DC
  Administered 2019-02-14 – 2019-02-15 (×2): 10 mg via ORAL
  Filled 2019-02-14 (×2): qty 1

## 2019-02-14 NOTE — Progress Notes (Signed)
RN received report from HD. 4L taken off. VSS

## 2019-02-14 NOTE — Progress Notes (Signed)
PROGRESS NOTE                                                                                                                                                                                                             Patient Demographics:    Darrell Johnson, is a 53 y.o. adult, DOB - 1965-09-12, RDE:[id number]  Admit date - 02/14/2019   Admitting Physician Ivor Costa, MD  Outpatient Primary MD for the patient is Kerin Perna, NP  LOS - 0  Outpatient Specialists: Nephrology for dialysis  Chief Complaint  Patient presents with  . 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  Reports having some dry cough but no fevers or chills.  Had some substernal chest pain which resolved by the time he came to the ED. In the ED he had a BC of 12 K, high-sensitivity troponin of 28, tested negative for COVID-19.  BUN of 65 and creatinine of 13.05.  Blood pressure was elevated 162/83 mmHg, was tachycardic, tachypneic with O2 sat 90% room air requiring BiPAP for respiratory distress.  Chest x-ray showed pulmonary edema and bilateral small pleural effusion. Placed on stepdown unit and nephrology consulted.   Subjective:   Patient seen in dialysis.  Was on 4 L via nasal cannula.  Reports some improvement in his breathing with dialysis.   Assessment  & Plan :    Principal Problem:   Acute respiratory failure with hypoxia (HCC) Likely combination of acute on chronic diastolic CHF with pulmonary edema and possible underlying COPD (undiagnosed).  Has been off BiPAP but still requiring 3-4 L via nasal cannula. 2D echo 2 years back with EF of 60% with grade 2  diastolic dysfunction.  Patient taking azithromycin for past 3 days for bronchitis symptoms. Monitor respiratory status following dialysis.  Will add oral prednisone and scheduled DuoNeb.  Continue azithromycin.  Counseled strongly on smoking cessation.  Active Problems: Essential hypertension Elevated blood pressure on presentation.  Resume home medication including amlodipine, Coreg, hydralazine and lisinopril.  HIV disease with CD4 of 428 Stable.  Continue home  meds  Tobacco abuse Counseled strongly on cessation.  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  Renal following  Chronic depression Continue Wellbutrin and amitriptyline  Hyperlipidemia Continue statin  Anemia of chronic kidney disease Continue iron supplement.  Hemoglobin stable  GERD Continue Pepcid  Elevated troponin Appears to be chronic.  No further symptoms.  Monitor on telemetry   Patient status: Inpatient Patient presenting with severe respiratory distress with acute hypoxic respiratory failure requiring BiPAP.  Patient still in acute hypoxic respiratory failure requiring oxygen via nasal cannula and being treated for volume overload and possible acute COPD.  He is at high risk for respiratory decompensation and needs to be monitored closely in inpatient setting for >2 midnights.  Code Status : Full code  Family Communication  : none  Disposition Plan  : Still in the next 48 hours if continues to improve Home possibly in the next 48 hours if continues to improve  Barriers For Discharge : Active symptoms  Consults  : Renal  Procedures  : Scheduled dialysis  DVT Prophylaxis  : Subcu heparin  Lab Results  Component Value Date   PLT 129 (L) 02/14/2019    Antibiotics  :   Anti-infectives (From admission, onward)   Start     Dose/Rate Route Frequency Ordered Stop   02/14/19 1700  Dolutegravir-Rilpivirine 50-25 MG TABS 1 tablet  Status:   Discontinued     1 tablet Oral Daily with supper 02/14/19 0437 02/14/19 0442   02/14/19 1700  dolutegravir (TIVICAY) tablet 50 mg     50 mg Oral Daily with supper 02/14/19 0443     02/14/19 1700  rilpivirine (EDURANT) tablet 25 mg     25 mg Oral Daily with supper 02/14/19 0443     02/14/19 1000  azithromycin (ZITHROMAX) tablet 250 mg    Note to Pharmacy: Take first 2 tablets together, then 1 every day until finished.     250 mg Oral Daily 02/14/19 0437 02/16/19 0959        Objective:   Vitals:   02/14/19 1115 02/14/19 1130 02/14/19 1145 02/14/19 1200  BP: 138/77 136/65 132/64 (!) 144/76  Pulse: 78 76 74 77  Resp: 16 16 16 16   Temp:      TempSrc:      SpO2:      Weight:      Height:        Wt Readings from Last 3 Encounters:  02/14/19 114.4 kg  02/08/19 105.2 kg  01/31/19 111.7 kg    No intake or output data in the 24 hours ending 02/14/19 1212   Physical Exam  Gen: Fatigued in some respiratory distress HEENT: Moist mucosa, supple neck  Chest: Diffuse rhonchi bilaterally CVS: N S1&S2, no murmurs,  GI: soft, NT, ND,  Musculoskeletal: warm, no edema     Data Review:    CBC Recent Labs  Lab 02/08/19 1115 02/14/19 0048 02/14/19 0620 02/14/19 0827  WBC 6.6 11.2* 8.3 7.9  HGB 8.9* 9.4* 8.5* 8.4*  HCT 27.6* 28.7* 25.5* 26.0*  PLT 153 151 132* 129*  MCV 104.5* 104.7* 104.5* 105.7*  MCH 33.7 34.3* 34.8* 34.1*  MCHC 32.2 32.8 33.3 32.3  RDW 15.7* 16.1* 16.0* 16.2*  LYMPHSABS 0.8 0.7  --   --   MONOABS 0.6 0.7  --   --   EOSABS 0.2 0.2  --   --   BASOSABS 0.0 0.0  --   --  Chemistries  Recent Labs  Lab 02/08/19 1115 02/14/19 0048 02/14/19 0620  NA 138 139 139  K 4.0 4.6 5.4*  CL 96* 98 96*  CO2 28 25 27   GLUCOSE 82 96 82  BUN 36* 65* 70*  CREATININE 9.46* 13.05* 13.57*  CALCIUM 8.8* 8.5* 8.6*  AST  --  21  --   ALT  --  23  --   ALKPHOS  --  81  --   BILITOT  --  0.9  --     ------------------------------------------------------------------------------------------------------------------ Recent Labs    02/14/19 0620  CHOL 127  HDL 32*  LDLCALC 58  TRIG 185*  CHOLHDL 4.0    Lab Results  Component Value Date   HGBA1C 5.1 02/14/2019   ------------------------------------------------------------------------------------------------------------------ No results for input(s): TSH, T4TOTAL, T3FREE, THYROIDAB in the last 72 hours.  Invalid input(s): FREET3 ------------------------------------------------------------------------------------------------------------------ No results for input(s): VITAMINB12, FOLATE, FERRITIN, TIBC, IRON, RETICCTPCT in the last 72 hours.  Coagulation profile No results for input(s): INR, PROTIME in the last 168 hours.  No results for input(s): DDIMER in the last 72 hours.  Cardiac Enzymes No results for input(s): CKMB, TROPONINI, MYOGLOBIN in the last 168 hours.  Invalid input(s): CK ------------------------------------------------------------------------------------------------------------------    Component Value Date/Time   BNP 648.2 (H) 02/08/2019 1118    Inpatient Medications  Scheduled Meds: . amitriptyline  25 mg Oral QHS  . amLODipine  10 mg Oral Daily  . aspirin EC  81 mg Oral Daily  . azithromycin  250 mg Oral Daily  . buPROPion  100 mg Oral BID  . carvedilol  25 mg Oral BID WC  . Chlorhexidine Gluconate Cloth  6 each Topical Q0600  . dolutegravir  50 mg Oral Q supper   And  . rilpivirine  25 mg Oral Q supper  . doxazosin  8 mg Oral QHS  . famotidine  40 mg Oral Daily  . ferric citrate  420 mg Oral TID WC  . heparin  5,000 Units Subcutaneous Q8H  . heparin  6,000 Units Dialysis Once in dialysis  . hydrALAZINE  100 mg Oral TID  . ketotifen  1 drop Both Eyes BID  . levalbuterol  1.25 mg Nebulization Q6H  . lisinopril  10 mg Oral Daily  . mometasone-formoterol  2 puff Inhalation BID  . montelukast   10 mg Oral QHS  . nicotine  21 mg Transdermal Daily  . pravastatin  40 mg Oral q1800   Continuous Infusions: . sodium chloride     PRN Meds:.sodium chloride, acetaminophen **OR** acetaminophen, alteplase, fluticasone, furosemide, hydrALAZINE, ondansetron **OR** ondansetron (ZOFRAN) IV  Micro Results Recent Results (from the past 240 hour(s))  SARS Coronavirus 2 (CEPHEID- Performed in [place] hospital lab), Hosp Order     Status: None   Collection Time: 02/14/19 12:36 AM   Specimen: Nasopharyngeal Swab  Result Value Ref Range Status   SARS Coronavirus 2 NEGATIVE NEGATIVE Final    Comment: (NOTE) If result is NEGATIVE SARS-CoV-2 target nucleic acids are NOT DETECTED. The SARS-CoV-2 RNA is generally detectable in upper and lower  respiratory specimens during the acute phase of infection. The lowest  concentration of SARS-CoV-2 viral copies this assay can detect is 250  copies / mL. A negative result does not preclude SARS-CoV-2 infection  and should not be used as the sole basis for treatment or other  patient management decisions.  A negative result may occur with  improper specimen collection / handling, submission of specimen other  than nasopharyngeal  swab, presence of viral mutation(s) within the  areas targeted by this assay, and inadequate number of viral copies  (<250 copies / mL). A negative result must be combined with clinical  observations, patient history, and epidemiological information. If result is POSITIVE SARS-CoV-2 target nucleic acids are DETECTED. The SARS-CoV-2 RNA is generally detectable in upper and lower  respiratory specimens dur ing the acute phase of infection.  Positive  results are indicative of active infection with SARS-CoV-2.  Clinical  correlation with patient history and other diagnostic information is  necessary to determine patient infection status.  Positive results do  not rule out bacterial infection or co-infection with other viruses.  If result is PRESUMPTIVE POSTIVE SARS-CoV-2 nucleic acids MAY BE PRESENT.   A presumptive positive result was obtained on the submitted specimen  and confirmed on repeat testing.  While 2019 novel coronavirus  (SARS-CoV-2) nucleic acids may be present in the submitted sample  additional confirmatory testing may be necessary for epidemiological  and / or clinical management purposes  to differentiate between  SARS-CoV-2 and other Sarbecovirus currently known to infect humans.  If clinically indicated additional testing with an alternate test  methodology [phone]) is advised. The SARS-CoV-2 RNA is generally  detectable in upper and lower respiratory sp ecimens during the acute  phase of infection. The expected result is Negative. Fact Sheet for Patients:  StrictlyIdeas.no Fact Sheet for Healthcare Providers: BankingDealers.co.za This test is not yet approved or cleared by the [place] FDA and has been authorized for detection and/or diagnosis of SARS-CoV-2 by FDA under an Emergency Use Authorization (EUA).  This EUA will remain in effect (meaning this test can be used) for the duration of the COVID-19 declaration under Section 564(b)(1) of the Act, 21 U.S.C. section 360bbb-3(b)(1), unless the authorization is terminated or revoked sooner. Performed at [facility], [place] [address] [place], [place] [address]   MRSA PCR Screening     Status: None   Collection Time: 02/14/19  4:38 AM   Specimen: Nasal Mucosa; Nasopharyngeal  Result Value Ref Range Status   MRSA by PCR NEGATIVE NEGATIVE Final    Comment:        The GeneXpert MRSA Assay (FDA approved for NASAL specimens only), is one component of a comprehensive MRSA colonization surveillance program. It is not intended to diagnose MRSA infection nor to guide or monitor treatment for MRSA infections. Performed at [facility], [place] [address], [place], [place]  [address]     Radiology Reports Dg Chest 2 View  Result Date: 02/08/2019 CLINICAL DATA:  Shortness of breath since yesterday. EXAM: CHEST - 2 VIEW COMPARISON:  01/31/2019. FINDINGS: Mildly enlarged cardiac silhouette with an interval decrease in size. Decreased prominence of the pulmonary vasculature and interstitial markings. Small amount of residual right pleural thickening/fluid. Stable linear atelectasis or scarring in the right mid and lower lung zones. Mild thoracic spine degenerative changes. IMPRESSION: 1. Improved changes of congestive heart failure. 2. Small amount of residual right pleural thickening/fluid. 3. Stable linear atelectasis or scarring in the right mid and lower lung zones. Electronically Signed   By: Claudie Revering M.D.   On: 02/08/2019 11:54   Dg Chest Port 1 View  Result Date: 02/14/2019 CLINICAL DATA:  CHF EXAM: PORTABLE CHEST 1 VIEW COMPARISON:  02/08/2019 FINDINGS: Interstitial and lower lobe predominant airspace opacities in the lungs bilaterally, suggesting moderate interstitial edema, less likely pneumonia. Small bilateral pleural effusions, right greater than left. No pneumothorax. Cardiomegaly. IMPRESSION: Cardiomegaly  with suspected moderate interstitial edema. Small bilateral pleural effusions, right greater than left. Electronically Signed   By: Julian Hy M.D.   On: 02/14/2019 00:33   Dg Chest Port 1 View  Result Date: 01/31/2019 CLINICAL DATA:  Shortness of breath EXAM: PORTABLE CHEST 1 VIEW COMPARISON:  01/23/2019 FINDINGS: Borderline cardiomegaly.  Stable mediastinal contours. Airspace opacity asymmetric to the right where there is chronic pleural thickening in the setting of prior rib fractures. No acute pleural effusion. No pneumothorax. IMPRESSION: 1. Bilateral airspace disease which could be edema or infection. 2. Right fibrothorax. Electronically Signed   By: Monte Fantasia M.D.   On: 01/31/2019 06:43   Dg Chest Portable 1 View  Result Date: 01/23/2019  CLINICAL DATA:  Shortness of breath for 1 day. EXAM: PORTABLE CHEST 1 VIEW COMPARISON:  Radiograph 01/13/2019, additional priors. FINDINGS: Slight decrease in cardiomegaly from prior exam. Right pleural effusion and basilar opacity appears similar to prior exam. Vascular congestion without pulmonary edema. Linear scarring in the right mid lung. No focal airspace disease or pneumothorax. IMPRESSION: 1. Slight decrease in cardiomegaly from prior exam. 2. Unchanged right pleural effusion and basilar opacity, which may be due to scarring or atelectasis. Electronically Signed   By: Keith Rake M.D.   On: 01/23/2019 21:08    Time Spent in minutes  25   Laportia Carley M.D on 02/14/2019 at 12:12 PM  Between 7am to 7pm - Pager - [phone]  After 7pm go to [email or web address] - password [facility]  [facility] -  Office  [phone]

## 2019-02-14 NOTE — H&P (Signed)
History and Physical    [name]:[id number] DOB: Dec 08, 1965 DOA: 02/14/2019  Referring MD/NP/PA:   PCP: Kerin Perna, NP   Patient coming from:  The patient is coming from home.  At baseline, pt is independent for most of ADL.        Chief Complaint: Shortness of breath  HPI: Darrell Johnson is a 53 y.o. adult with medical history significant of hypertension, tobacco abuse, GERD, gout, depression with anxiety, remote seizure, HIV (CD4=428 on 01/06/2019), ESRD-HD (MWF), anemia, who presents with shortness of breath.  Patient states that he developed shortness of breath yesterday, which has been progressively worsening.  He has mild dry cough, no fever or chills.  He had mild chest pain earlier, which has resolved.  No nausea, vomiting, diarrhea, abdominal pain, symptoms of UTI or unilateral weakness.  He states that he has been compliant to dialysis.  ED Course: pt was found to have WBC 12.2, troponin 28, negative COVID-19 test, potassium 4.6, bicarbonate 25, creatinine 13.05, BUN 65, temperature normal, blood pressure 162/83, tachycardia, tachypnea, oxygen saturation 92% on room air, BiPAP started, chest x-ray showed cardiomegaly, pulmonary edema and bilateral small pleural effusion.  Patient is placed on stepdown bed for observation.  ED physician will consult renal for dialysis.  Review of Systems:   General: no fevers, chills, no body weight gain, has fatigue HEENT: no blurry vision, hearing changes or sore throat Respiratory: has dyspnea, coughing, no wheezing CV: currently no chest pain, no palpitations GI: no nausea, vomiting, abdominal pain, diarrhea, constipation GU: no dysuria, burning on urination, increased urinary frequency, hematuria  Ext: no leg edema Neuro: no unilateral weakness, numbness, or tingling, no vision change or hearing loss Skin: no rash, no skin tear. MSK: No muscle spasm, no deformity, no limitation of range of movement in spin Heme: No  easy bruising.  Travel history: No recent long distant travel.  Allergy: No Known Allergies  Past Medical History:  Diagnosis Date  . Anemia   . Anxiety   . Arthritis    "knees" (04/13/2017)  . Asthma   . CHF (congestive heart failure) ([place])   . ESRD (end stage renal disease) on dialysis [facility])    "MWF; [facility]" (04/13/2017)  . Gout, unspecified 08/13/2009   Qualifier: Diagnosis of  By: Redmond Pulling  MD, Mateo Flow    . H1N1 influenza    March 2016  . History of blood transfusion 02/2017   "related to OR"  . HIV infection ([place]) 1980's   on ART therapy since, followed by ID clinic, complicated  by neuropathy  . Hyperlipidemia    hypertrygliceridemia determined ti be secondary to ART therpay  . HYPERTENSION 05/08/2006  . Pneumonia    "once; years ago" (04/13/2017)  . Rib fractures 01/2009  . Seizures ([place])    last seizure was in the 1990s, pt has family history of seizures; "probably related to alcohol" (04/13/2017)  . 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 Syphilis 1997   history of syphilis 1997  . Tobacco abuse     Past Surgical History:  Procedure Laterality Date  . AV FISTULA PLACEMENT Right 12/02/2013   Procedure: ARTERIOVENOUS (AV) FISTULA CREATION;  Surgeon: Darrell Posner, MD;  Location: [place];  Service: Vascular;  Laterality: Right;  . INCISION AND DRAINAGE ABSCESS Right 03/09/2017   Procedure: INCISION AND  DRAINAGE Right Scrotal Abscess;  Surgeon: Darrell Gallo, MD;  Location: [place];  Service: Urology;  Laterality: Right;  . THROMBECTOMY Right ~ 2016   "AV fistula clotted off"    Social History:  reports that he has been smoking cigarettes. He has a 2.50 pack-year smoking history. He has never used smokeless tobacco. He reports that he does not drink alcohol or use drugs.  Family History:  Family History  Problem Relation  Age of Onset  . Cancer Mother   . Hypertension Mother   . COPD Father   . Hypertension Father   . Diabetes Sister   . Hypertension Sister   . Diabetes Brother   . Hypertension Brother   . Stroke Neg Hx      Prior to Admission medications   Medication Sig Start Date End Date Taking? Authorizing Provider  albuterol (PROVENTIL HFA;VENTOLIN HFA) 108 (90 Base) MCG/ACT inhaler Inhale 2 puffs into the lungs every 4 (four) hours as needed for up to 30 days for wheezing or shortness of breath. 09/21/18 02/08/19  Kerin Perna, NP  amitriptyline (ELAVIL) 25 MG tablet TAKE 1 TABLET BY MOUTH EVERYDAY AT BEDTIME 02/10/19   Darrell Basques, MD  amLODipine (NORVASC) 10 MG tablet Take 1 tablet (10 mg total) by mouth daily. 09/21/18   Kerin Perna, NP  azelastine (OPTIVAR) 0.05 % ophthalmic solution Place 1 drop into both eyes daily.  12/24/18   [provider]  azithromycin (ZITHROMAX) 250 MG tablet Take 1 tablet (250 mg total) by mouth daily. Take first 2 tablets together, then 1 every day until finished. 02/08/19   Darrell Gambler, MD  budesonide-formoterol [facility]) 160-4.5 MCG/ACT inhaler Inhale 2 puffs into the lungs 2 (two) times daily. 09/21/18   Kerin Perna, NP  buPROPion [facility] SR) 100 MG 12 hr tablet TAKE 1 TABLET BY MOUTH TWICE A DAY 02/10/19   Darrell Basques, MD  carvedilol (COREG) 25 MG tablet Take 1 tablet (25 mg total) by mouth 2 (two) times daily with a meal. 09/21/18   Kerin Perna, NP  Dolutegravir-Rilpivirine (JULUCA) 50-25 MG TABS Take 1 tablet by mouth daily with supper. 12/29/18   Darrell Johnson, RPH-CPP  doxazosin (CARDURA) 8 MG tablet Take 1 tablet (8 mg total) by mouth at bedtime. 09/21/18   Kerin Perna, NP  famotidine (PEPCID) 40 MG tablet Take 1 tablet (40 mg total) by mouth daily. Patient not taking: Reported on 02/08/2019 01/31/19   Kerin Perna, NP  ferric citrate (AURYXIA) 1 GM 210 MG(Fe) tablet Take 420 mg by mouth 3 (three)  times daily with meals.    [provider]  fluticasone (FLONASE) 50 MCG/ACT nasal spray Place 1 spray into both nostrils as needed for allergies.  09/23/18   [provider]  furosemide (LASIX) 40 MG tablet TAKE 1 TABLET (40 MG TOTAL) BY MOUTH DAILY AS NEEDED FOR FLUID. 02/09/19   Kerin Perna, NP  hydrALAZINE (APRESOLINE) 100 MG tablet Take 100 mg by mouth 3 (three) times daily.  12/01/18   [provider]  ipratropium-albuterol (DUONEB) 0.5-2.5 (3) MG/3ML SOLN Take 3 mLs by nebulization every 6 (six) hours as needed. Patient not taking: Reported on 02/08/2019 09/21/18   Kerin Perna, NP  lisinopril (ZESTRIL) 10 MG tablet Take 10 mg by mouth daily. 09/06/18   [provider]  montelukast (SINGULAIR) 10 MG tablet Take 10 mg by mouth at bedtime.  11/16/18   [provider]  NICOTROL 10 MG inhaler  Inhale 1 continuous puffing into the lungs as needed for smoking cessation.  01/18/19   [provider]  pravastatin (PRAVACHOL) 40 MG tablet TAKE 1 TABLET BY MOUTH EVERYDAY AT BEDTIME 02/09/19   Kerin Perna, NP  predniSONE (DELTASONE) 20 MG tablet Take 2 tablets (40 mg total) by mouth daily. 02/08/19   Darrell Gambler, MD    Physical Exam: Vitals:   02/14/19 0245 02/14/19 0300 02/14/19 0313 02/14/19 0315  BP: (!) 168/68 (!) 152/79  (!) 160/85  Pulse: 85 80 83 84  Resp: 15 20 (!) 29 (!) 21  Temp:      TempSrc:      SpO2: 100% 99% 100% 100%  Weight:      Height:       General: Not in acute distress HEENT:       Eyes: PERRL, EOMI, no scleral icterus.       ENT: No discharge from the ears and nose, no pharynx injection, no tonsillar enlargement.        Neck: No JVD, no bruit, no mass felt. Heme: No neck lymph node enlargement. Cardiac: S1/S2, RRR, No murmurs, No gallops or rubs. Respiratory: has fine crackles at the bases.  No wheezing, rhonchi or rubs. GI: Soft, nondistended, nontender, no rebound pain, no organomegaly, BS  present. GU: No hematuria Ext: No pitting leg edema bilaterally. 2+DP/PT pulse bilaterally. Musculoskeletal: No joint deformities, No joint redness or warmth, no limitation of ROM in spin. Skin: No rashes.  Neuro: Alert, oriented X3, cranial nerves II-XII grossly intact, moves all extremities. Psych: Patient is not psychotic, no suicidal or hemocidal ideation.  Labs on Admission: I have personally reviewed following labs and imaging studies  CBC: Recent Labs  Lab 02/08/19 1115 02/14/19 0048  WBC 6.6 11.2*  NEUTROABS 4.9 9.4*  HGB 8.9* 9.4*  HCT 27.6* 28.7*  MCV 104.5* 104.7*  PLT 153 474   Basic Metabolic Panel: Recent Labs  Lab 02/08/19 1115 02/14/19 0048  NA 138 139  K 4.0 4.6  CL 96* 98  CO2 28 25  GLUCOSE 82 96  BUN 36* 65*  CREATININE 9.46* 13.05*  CALCIUM 8.8* 8.5*   GFR: Estimated Creatinine Clearance (by C-G formula based on SCr of 13.05 mg/dL (H)) Male: 6.4 mL/min (A) Male: 7.8 mL/min (A) Liver Function Tests: Recent Labs  Lab 02/14/19 0048  AST 21  ALT 23  ALKPHOS 81  BILITOT 0.9  PROT 6.7  ALBUMIN 3.6   No results for input(s): LIPASE, AMYLASE in the last 168 hours. No results for input(s): AMMONIA in the last 168 hours. Coagulation Profile: No results for input(s): INR, PROTIME in the last 168 hours. Cardiac Enzymes: No results for input(s): CKTOTAL, CKMB, CKMBINDEX, TROPONINI in the last 168 hours. BNP (last 3 results) No results for input(s): PROBNP in the last 8760 hours. HbA1C: No results for input(s): HGBA1C in the last 72 hours. CBG: No results for input(s): GLUCAP in the last 168 hours. Lipid Profile: No results for input(s): CHOL, HDL, LDLCALC, TRIG, CHOLHDL, LDLDIRECT in the last 72 hours. Thyroid Function Tests: No results for input(s): TSH, T4TOTAL, FREET4, T3FREE, THYROIDAB in the last 72 hours. Anemia Panel: No results for input(s): VITAMINB12, FOLATE, FERRITIN, TIBC, IRON, RETICCTPCT in the last 72 hours. Urine analysis:     Component Value Date/Time   COLORURINE YELLOW 07/06/2013 1808   APPEARANCEUR CLEAR 07/06/2013 1808   LABSPEC 1.013 07/06/2013 1808   PHURINE 7.5 07/06/2013 1808   GLUCOSEU NEGATIVE 07/06/2013 1808  GLUCOSEU NEG mg/dL 11/01/2009 2123   HGBUR SMALL (A) 07/06/2013 1808   BILIRUBINUR NEGATIVE 07/06/2013 1808   KETONESUR NEGATIVE 07/06/2013 1808   PROTEINUR >300 (A) 07/06/2013 1808   UROBILINOGEN 0.2 07/06/2013 1808   NITRITE NEGATIVE 07/06/2013 1808   LEUKOCYTESUR NEGATIVE 07/06/2013 1808   Sepsis Labs: @LABRCNTIP (procalcitonin:4,lacticidven:4) ) Recent Results (from the past 240 hour(s))  SARS Coronavirus 2 (CEPHEID- Performed in [place] hospital lab), Hosp Order     Status: None   Collection Time: 02/14/19 12:36 AM   Specimen: Nasopharyngeal Swab  Result Value Ref Range Status   SARS Coronavirus 2 NEGATIVE NEGATIVE Final    Comment: (NOTE) If result is NEGATIVE SARS-CoV-2 target nucleic acids are NOT DETECTED. The SARS-CoV-2 RNA is generally detectable in upper and lower  respiratory specimens during the acute phase of infection. The lowest  concentration of SARS-CoV-2 viral copies this assay can detect is 250  copies / mL. A negative result does not preclude SARS-CoV-2 infection  and should not be used as the sole basis for treatment or other  patient management decisions.  A negative result may occur with  improper specimen collection / handling, submission of specimen other  than nasopharyngeal swab, presence of viral mutation(s) within the  areas targeted by this assay, and inadequate number of viral copies  (<250 copies / mL). A negative result must be combined with clinical  observations, patient history, and epidemiological information. If result is POSITIVE SARS-CoV-2 target nucleic acids are DETECTED. The SARS-CoV-2 RNA is generally detectable in upper and lower  respiratory specimens dur ing the acute phase of infection.  Positive  results are indicative  of active infection with SARS-CoV-2.  Clinical  correlation with patient history and other diagnostic information is  necessary to determine patient infection status.  Positive results do  not rule out bacterial infection or co-infection with other viruses. If result is PRESUMPTIVE POSTIVE SARS-CoV-2 nucleic acids MAY BE PRESENT.   A presumptive positive result was obtained on the submitted specimen  and confirmed on repeat testing.  While 2019 novel coronavirus  (SARS-CoV-2) nucleic acids may be present in the submitted sample  additional confirmatory testing may be necessary for epidemiological  and / or clinical management purposes  to differentiate between  SARS-CoV-2 and other Sarbecovirus currently known to infect humans.  If clinically indicated additional testing with an alternate test  methodology [phone]) is advised. The SARS-CoV-2 RNA is generally  detectable in upper and lower respiratory sp ecimens during the acute  phase of infection. The expected result is Negative. Fact Sheet for Patients:  StrictlyIdeas.no Fact Sheet for Healthcare Providers: BankingDealers.co.za This test is not yet approved or cleared by the [place] FDA and has been authorized for detection and/or diagnosis of SARS-CoV-2 by FDA under an Emergency Use Authorization (EUA).  This EUA will remain in effect (meaning this test can be used) for the duration of the COVID-19 declaration under Section 564(b)(1) of the Act, 21 U.S.C. section 360bbb-3(b)(1), unless the authorization is terminated or revoked sooner. Performed at [facility], [place] [address] [place], [place] [address]      Radiological Exams on Admission: Dg Chest Port 1 View  Result Date: 02/14/2019 CLINICAL DATA:  CHF EXAM: PORTABLE CHEST 1 VIEW COMPARISON:  02/08/2019 FINDINGS: Interstitial and lower lobe predominant airspace opacities in the lungs bilaterally, suggesting moderate  interstitial edema, less likely pneumonia. Small bilateral pleural effusions, right greater than left. No pneumothorax. Cardiomegaly. IMPRESSION: Cardiomegaly with suspected moderate interstitial edema. Small  bilateral pleural effusions, right greater than left. Electronically Signed   By: Julian Hy M.D.   On: 02/14/2019 00:33     EKG: Independently reviewed.  Sinus rhythm, tachycardia, QTC 442, T wave peaking in.  Assessment/Plan Principal Problem:   Acute respiratory failure with hypoxia (HCC) Active Problems:   HIV disease ([place])   Essential hypertension   Tobacco abuse   ESRD on dialysis ([place])   Anemia in chronic kidney disease   Hyperlipidemia   Pulmonary edema   GERD (gastroesophageal reflux disease)   Depression   Elevated troponin   Acute on chronic diastolic (congestive) heart failure (HCC)   Acute respiratory failure with hypoxia due to pulmonary edema and acute on chronic diastolic congestive heart failure: 2D echo on 04/13/2017 showed EF of 60-35% with grade 2 diastolic dysfunction.  Chest x-ray showed cardiomegaly no pulmonary edema.  Given his long history of smoking, patient may have undiagnosed COPD, which may have contributed partially. Pt is taking azithromycin in the past 3 days, he will need 2 more doses.  Patient states that it is possible for COPD. ED physician will consult renal for dialysis.   -will place on SDU as inpt -continue BiPAP -Continue bronchodilators and Singulair  HTN: Blood pressure 162/83 -Continue home medications: Amlodipine, Coreg, hydralazine, lisinopril -IV hydralazine prn  HIV disease ([place]): CD4 428, viral load less than 20 on 01/06/2019 -Continue home medications  Tobacco abuse: -nicotine patch  ESRD on dialysis (MWF): -renal will be consulted for HD by EDP  Anemia in chronic kidney disease: Hemoglobin stable.  8.9 on 02/08/2019 --> 9.4 today -Continue iron supplement  Hyperlipidemia: -Pravastatin  GERD (gastroesophageal  reflux disease): -Pepcid  Depression: -Amitriptyline, Wellbutrin  Elevated troponin: trop 28.  Patient seems to have chronic elevation of troponin, recently he had troponin 23-24.  Patient had mild chest pain earlier, which is resolved currently. Likely has demand ischemia secondary to pulmonary edema. -Start aspirin -Trend troponin - Check A1c, FLP, - Repeat EKG in morning.   DVT ppx: SQ Heparin Code Status: Full code Family Communication: None at bed side.  Disposition Plan:  Anticipate discharge back to previous home environment Consults called:  EDP will consult renal for HD Admission status:  SDU/obs  Date of Service 02/14/2019    [facility]   If 7PM-7AM, please contact night-coverage [email or web address] Password [facility] 02/14/2019, 3:34 AM

## 2019-02-14 NOTE — Consult Note (Signed)
[facility] Renal Consultation Note  Requesting NA:[email or web address] Dhungel, MD   Indication for Consultation:  ESRD  Chief complaint: shortness of breath   HPI:  Darrell Johnson is a 53 y.o. adult male with a history of ESRD on HD MWF who presented to the hospital with respiratory distress.  He had tried inhalers at home and has not gotten any relief; states he was told of a diagnosis of COPD recently.  He was felt to have fluid overload.  He was initiated on BIPAP.  Nephrology was consulted for assistance with dialysis.  Note that he has previous admissions for same.  He reports compliance with outpatient dialysis.  States he first had shortness of breath on Saturday.  Normally Dialy[facility] at [facility] via RUE AVF.  States he normally pulls a little closer to 2-2.5 kg.  He was reduced from BIPAP to 4 liters oxygen prior to HD.  With regard to anemia, denies blood per rectum or dark stools.   SARS coronavirus 2 test is negative this admission on 7/27 as well as other recent tests two, three, and four weeks ago.    PMHx:   Past Medical History:  Diagnosis Date  . Anemia   . Anxiety   . Arthritis    "knees" (04/13/2017)  . Asthma   . CHF (congestive heart failure) ([place])   . ESRD (end stage renal disease) on dialysis [facility])    "MWF; [facility]" (04/13/2017)  . Gout, unspecified 08/13/2009   Qualifier: Diagnosis of  By: Redmond Pulling  MD, Mateo Flow    . H1N1 influenza    March 2016  . History of blood transfusion 02/2017   "related to OR"  . HIV infection ([place]) 1980's   on ART therapy since, followed by ID clinic, complicated  by neuropathy  . Hyperlipidemia    hypertrygliceridemia determined ti be secondary to ART therpay  . HYPERTENSION 05/08/2006  . Pneumonia    "once; years ago" (04/13/2017)  . Rib fractures 01/2009  . Seizures ([place])    last seizure was in the 1990s, pt has family history of seizures; "probably related to alcohol" (04/13/2017)  . 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 Syphilis 1997   history of syphilis 1997  . Tobacco abuse     Past Surgical History:  Procedure Laterality Date  . AV FISTULA PLACEMENT Right 12/02/2013   Procedure: ARTERIOVENOUS (AV) FISTULA CREATION;  Surgeon: Rosetta Posner, MD;  Location: [place];  Service: Vascular;  Laterality: Right;  . INCISION AND DRAINAGE ABSCESS Right 03/09/2017   Procedure: INCISION AND DRAINAGE Right Scrotal Abscess;  Surgeon: Franchot Gallo, MD;  Location: [place];  Service: Urology;  Laterality: Right;  . THROMBECTOMY Right ~ 2016   "AV fistula clotted off"    Family Hx:  Family History  Problem Relation Age of Onset  . Cancer Mother   . Hypertension Mother   . COPD Father   . Hypertension Father   . Diabetes Sister   . Hypertension Sister   . Diabetes Brother   . Hypertension Brother   . Stroke Neg Hx     Social History:  reports that he has been smoking cigarettes. He has a 2.50 pack-year smoking history. He has never used smokeless tobacco. He reports that he does not drink alcohol or use drugs.  Allergies: No Known Allergies  Medications: Prior to Admission medications   Medication Sig Start Date End Date Taking? Authorizing Provider  albuterol (PROVENTIL HFA;VENTOLIN HFA) 108 (90 Base) MCG/ACT inhaler Inhale 2 puffs into the lungs every 4 (four) hours as needed for up to 30 days for wheezing or shortness of breath. 09/21/18 02/08/19  Kerin Perna, NP  amitriptyline (ELAVIL) 25 MG tablet TAKE 1 TABLET BY MOUTH EVERYDAY AT BEDTIME 02/10/19   Carlyle Basques, MD  amLODipine (NORVASC) 10 MG tablet Take 1 tablet (10 mg total) by mouth daily. 09/21/18   Kerin Perna, NP  azelastine (OPTIVAR) 0.05 % ophthalmic solution Place 1 drop into both eyes daily.  12/24/18   [provider]  azithromycin (ZITHROMAX) 250 MG tablet Take 1 tablet  (250 mg total) by mouth daily. Take first 2 tablets together, then 1 every day until finished. 02/08/19   Sherwood Gambler, MD  budesonide-formoterol [facility]) 160-4.5 MCG/ACT inhaler Inhale 2 puffs into the lungs 2 (two) times daily. 09/21/18   Kerin Perna, NP  buPROPion [facility] SR) 100 MG 12 hr tablet TAKE 1 TABLET BY MOUTH TWICE A DAY 02/10/19   Carlyle Basques, MD  carvedilol (COREG) 25 MG tablet Take 1 tablet (25 mg total) by mouth 2 (two) times daily with a meal. 09/21/18   Kerin Perna, NP  Dolutegravir-Rilpivirine (JULUCA) 50-25 MG TABS Take 1 tablet by mouth daily with supper. 12/29/18   Kuppelweiser, Cassie L, RPH-CPP  doxazosin (CARDURA) 8 MG tablet Take 1 tablet (8 mg total) by mouth at bedtime. 09/21/18   Kerin Perna, NP  famotidine (PEPCID) 40 MG tablet Take 1 tablet (40 mg total) by mouth daily. Patient not taking: Reported on 02/08/2019 01/31/19   Kerin Perna, NP  ferric citrate (AURYXIA) 1 GM 210 MG(Fe) tablet Take 420 mg by mouth 3 (three) times daily with meals.    [provider]  fluticasone (FLONASE) 50 MCG/ACT nasal spray Place 1 spray into both nostrils as needed for allergies.  09/23/18   [provider]  furosemide (LASIX) 40 MG tablet TAKE 1 TABLET (40 MG TOTAL) BY MOUTH DAILY AS NEEDED FOR FLUID. 02/09/19   Kerin Perna, NP  hydrALAZINE (APRESOLINE) 100 MG tablet Take 100 mg by mouth 3 (three) times daily.  12/01/18   [provider]  ipratropium-albuterol (DUONEB) 0.5-2.5 (3) MG/3ML SOLN Take 3 mLs by nebulization every 6 (six) hours as needed. Patient not taking: Reported on 02/08/2019 09/21/18   Kerin Perna, NP  lisinopril (ZESTRIL) 10 MG tablet Take 10 mg by mouth daily. 09/06/18   [provider]  montelukast (SINGULAIR) 10 MG tablet Take 10 mg by mouth at bedtime.  11/16/18   [provider]  NICOTROL 10 MG inhaler Inhale 1 continuous puffing into the lungs as needed for smoking cessation.   01/18/19   [provider]  pravastatin (PRAVACHOL) 40 MG tablet TAKE 1 TABLET BY MOUTH EVERYDAY AT BEDTIME 02/09/19   Kerin Perna, NP  predniSONE (DELTASONE) 20 MG tablet Take 2 tablets (40 mg total) by mouth daily. 02/08/19   Sherwood Gambler, MD    I have reviewed the patient's current medications.  Labs:  BMP Latest Ref Rng & Units 02/14/2019 02/14/2019 02/08/2019  Glucose 70 - 99 mg/dL 82 96 82  BUN 6 - 20 mg/dL 70(H) 65(H) 36(H)  Creatinine 0.61 - 1.24 mg/dL 13.57(H) 13.05(H) 9.46(H)  BUN/Creat Ratio 6 - 22 (calc) - - -  Sodium 135 - 145 mmol/L 139 139 138  Potassium 3.5 - 5.1 mmol/L 5.4(H) 4.6 4.0  Chloride 98 - 111 mmol/L 96(L) 98 96(L)  CO2 22 - 32 mmol/L 27 25 28   Calcium 8.9 - 10.3 mg/dL 8.6(L) 8.5(L) 8.8(L)    ROS:  Pertinent items noted in HPI and remainder of comprehensive ROS otherwise negative.   Physical Exam: Vitals:   02/14/19 0709 02/14/19 0738  BP: (!) 149/74 (!) 159/91  Pulse: 76 78  Resp: 14 15  Temp:  98.6 F (37 C)  SpO2: 100% 100%     General: adult male in bed in NAD at rest  HEENT: NCAT  Eyes: EOMI; sclera anicteric  Neck: supple; trache midline Heart: RRR; no rub Lungs: wheezing; unlabored at rest  Abdomen: softly distended/obese habitus. NT Extremities: non-pitting edema Skin: no rash on extremities exposed  Neuro: alert and oriented x3 ; conversant  Access: RUE AVF in use   Outpatient HD orders:  MWF [facility] via [facility]  4.5 hours  BF 475; DF autoflow 2.0 2 K /2 Ca EDW 111 kg  venofer 50 mg weekly  Calcitriol 3.75 mcg three times a week  parsabiv 10 mg with HD three times a week  mircera 100 mcg every 2 weeks; last on 7/15   Assessment/Plan:  # End-stage renal disease On hemodialysis.  HD today and per MWF schedule   # Acute hypoxic respiratory failure on BiPAP Oxygen support as needed UF with dialysis as tolerated - optimize regimen for COPD per primary team    #Hypertension UF with HD as above  #  hyperkalemia Dialysis today and renal diet  # Anemia 2/2 CKD  - Plan for Aranesp 100 mcg once on 7/29; last mircera 7/15 and due again 7/29  Claudia Desanctis 02/14/2019, 8:24 AM

## 2019-02-14 NOTE — Progress Notes (Signed)
Renal Navigator notified OP HD clinic/[facility] of patient's admission and negative COVID 19 rapid test result in order to provide continuity of care and safety.  Alphonzo Cruise, [place] Renal Navigator [phone]

## 2019-02-14 NOTE — Progress Notes (Signed)
  Echocardiogram 2D Echocardiogram has been performed.  Darrell Johnson 02/14/2019, 4:42 PM

## 2019-02-14 NOTE — ED Provider Notes (Signed)
[facility] EMERGENCY DEPARTMENT Provider Note   CSN: [id number] Arrival date & time: 02/14/19  0007     History   Chief Complaint Chief Complaint  Patient presents with  . Shortness of Breath    HPI Darrell Johnson is a 53 y.o. adult.     The history is provided by the patient and medical records.     53 year old male with history of anemia, anxiety, asthma, CHF, end-stage renal disease on hemodialysis (schedule Monday Wednesday Friday), gout, hyperlipidemia, hypertension, history of HIV, presenting to the ED for shortness of breath.  He has been doing home inhalers all day today without any significant relief.  EMS attempted to use CPAP but patient did not tolerate this.  He was admitted earlier in the month for similar by myself.  He has not had any cough, fever, or known COVID exposures.  No missed treatment sessions  Past Medical History:  Diagnosis Date  . Anemia   . Anxiety   . Arthritis    "knees" (04/13/2017)  . Asthma   . CHF (congestive heart failure) ([place])   . ESRD (end stage renal disease) on dialysis [facility])    "MWF; [facility]" (04/13/2017)  . Gout, unspecified 08/13/2009   Qualifier: Diagnosis of  By: Redmond Pulling  MD, Mateo Flow    . H1N1 influenza    March 2016  . History of blood transfusion 02/2017   "related to OR"  . HIV infection ([place]) 1980's   on ART therapy since, followed by ID clinic, complicated  by neuropathy  . Hyperlipidemia    hypertrygliceridemia determined ti be secondary to ART therpay  . HYPERTENSION 05/08/2006  . Pneumonia    "once; years ago" (04/13/2017)  . Rib fractures 01/2009  . Seizures ([place])    last seizure was in the 1990s, pt has family history of seizures; "probably related to alcohol" (04/13/2017)  . 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 Syphilis 1997   history of  syphilis 1997  . Tobacco abuse     Patient Active Problem List   Diagnosis Date Noted  . Pulmonary edema 01/31/2019  . Moderate mitral regurgitation   . Acute respiratory failure with hypoxia ([place]) 03/29/2017  . Trigger finger of right hand 09/11/2016  . Hyperlipidemia 03/31/2016  . Anxiety state 03/11/2016  . Lumbar radiculopathy 01/28/2016  . Seasonal allergies   . Healthcare maintenance 05/03/2015  . Anemia in chronic kidney disease 08/30/2014  . ESRD on dialysis ([place]) 12/27/2013  . History of syphilis 11/07/2013  . Arthritis 09/09/2013  . Tobacco abuse 09/07/2013  . Chronic pain disorder 04/28/2013  . Gout 08/13/2009  . HIV disease ([place]) 05/08/2006  . Essential hypertension 05/08/2006  . SEIZURE DISORDER 05/08/2006    Past Surgical History:  Procedure Laterality Date  . AV FISTULA PLACEMENT Right 12/02/2013   Procedure: ARTERIOVENOUS (AV) FISTULA CREATION;  Surgeon: Rosetta Posner, MD;  Location: [facility];  Service: Vascular;  Laterality: Right;  . INCISION AND DRAINAGE ABSCESS Right 03/09/2017   Procedure: INCISION AND DRAINAGE Right Scrotal Abscess;  Surgeon: Franchot Gallo, MD;  Location: [facility];  Service: Urology;  Laterality: Right;  . THROMBECTOMY Right ~ 2016   "AV fistula clotted off"        Home Medications    Prior to Admission medications   Medication Sig Start Date End Date  Taking? Authorizing Provider  albuterol (PROVENTIL HFA;VENTOLIN HFA) 108 (90 Base) MCG/ACT inhaler Inhale 2 puffs into the lungs every 4 (four) hours as needed for up to 30 days for wheezing or shortness of breath. 09/21/18 02/08/19  Kerin Perna, NP  amitriptyline (ELAVIL) 25 MG tablet TAKE 1 TABLET BY MOUTH EVERYDAY AT BEDTIME 02/10/19   Carlyle Basques, MD  amLODipine (NORVASC) 10 MG tablet Take 1 tablet (10 mg total) by mouth daily. 09/21/18   Kerin Perna, NP  azelastine (OPTIVAR) 0.05 % ophthalmic solution Place 1 drop into both eyes daily.  12/24/18   [provider]   azithromycin (ZITHROMAX) 250 MG tablet Take 1 tablet (250 mg total) by mouth daily. Take first 2 tablets together, then 1 every day until finished. 02/08/19   Sherwood Gambler, MD  budesonide-formoterol [facility]) 160-4.5 MCG/ACT inhaler Inhale 2 puffs into the lungs 2 (two) times daily. 09/21/18   Kerin Perna, NP  buPROPion [facility] SR) 100 MG 12 hr tablet TAKE 1 TABLET BY MOUTH TWICE A DAY 02/10/19   Carlyle Basques, MD  carvedilol (COREG) 25 MG tablet Take 1 tablet (25 mg total) by mouth 2 (two) times daily with a meal. 09/21/18   Kerin Perna, NP  Dolutegravir-Rilpivirine (JULUCA) 50-25 MG TABS Take 1 tablet by mouth daily with supper. 12/29/18   Kuppelweiser, Cassie L, RPH-CPP  doxazosin (CARDURA) 8 MG tablet Take 1 tablet (8 mg total) by mouth at bedtime. 09/21/18   Kerin Perna, NP  famotidine (PEPCID) 40 MG tablet Take 1 tablet (40 mg total) by mouth daily. Patient not taking: Reported on 02/08/2019 01/31/19   Kerin Perna, NP  ferric citrate (AURYXIA) 1 GM 210 MG(Fe) tablet Take 420 mg by mouth 3 (three) times daily with meals.    [provider]  fluticasone (FLONASE) 50 MCG/ACT nasal spray Place 1 spray into both nostrils as needed for allergies.  09/23/18   [provider]  furosemide (LASIX) 40 MG tablet TAKE 1 TABLET (40 MG TOTAL) BY MOUTH DAILY AS NEEDED FOR FLUID. 02/09/19   Kerin Perna, NP  hydrALAZINE (APRESOLINE) 100 MG tablet Take 100 mg by mouth 3 (three) times daily.  12/01/18   [provider]  ipratropium-albuterol (DUONEB) 0.5-2.5 (3) MG/3ML SOLN Take 3 mLs by nebulization every 6 (six) hours as needed. Patient not taking: Reported on 02/08/2019 09/21/18   Kerin Perna, NP  lisinopril (ZESTRIL) 10 MG tablet Take 10 mg by mouth daily. 09/06/18   [provider]  montelukast (SINGULAIR) 10 MG tablet Take 10 mg by mouth at bedtime.  11/16/18   [provider]  NICOTROL 10 MG inhaler Inhale 1 continuous  puffing into the lungs as needed for smoking cessation.  01/18/19   [provider]  pravastatin (PRAVACHOL) 40 MG tablet TAKE 1 TABLET BY MOUTH EVERYDAY AT BEDTIME 02/09/19   Kerin Perna, NP  predniSONE (DELTASONE) 20 MG tablet Take 2 tablets (40 mg total) by mouth daily. 02/08/19   Sherwood Gambler, MD    Family History Family History  Problem Relation Age of Onset  . Cancer Mother   . Hypertension Mother   . COPD Father   . Hypertension Father   . Diabetes Sister   . Hypertension Sister   . Diabetes Brother   . Hypertension Brother   . Stroke Neg Hx     Social History Social History   Tobacco Use  . Smoking status: Current Some Day Smoker  Packs/day: 0.10    Years: 25.00    Pack years: 2.50    Types: Cigarettes    Last attempt to quit: 10/19/2017    Years since quitting: 1.3  . Smokeless tobacco: Never Used  . Tobacco comment: "quitting"  Substance Use Topics  . Alcohol use: No    Alcohol/week: 0.0 standard drinks    Comment: 04/13/2017 "quit ~ 2014"  . Drug use: No     Allergies   Patient has no known allergies.   Review of Systems Review of Systems  Respiratory: Positive for shortness of breath.   All other systems reviewed and are negative.    Physical Exam Updated Vital Signs BP (!) 214/127 (BP Location: Left Arm)   Pulse (!) 118   Temp 98 F (36.7 C) (Oral)   Resp (!) 32   Ht 5\' 9"  (1.753 m)   Wt 105.2 kg   SpO2 99%   BMI 34.26 kg/m   Physical Exam Vitals signs and nursing note reviewed.  Constitutional:      General: He is in acute distress.     Appearance: He is well-developed.  HENT:     Head: Normocephalic and atraumatic.  Eyes:     Conjunctiva/sclera: Conjunctivae normal.     Pupils: Pupils are equal, round, and reactive to light.  Neck:     Musculoskeletal: Normal range of motion.  Cardiovascular:     Rate and Rhythm: Normal rate and regular rhythm.     Heart sounds: Normal heart sounds.  Pulmonary:     Effort:  Pulmonary effort is normal.     Breath sounds: Rales present.     Comments: Distressed, tripoding, rales throughout, able to speak in 1-2 word phrases Abdominal:     General: Bowel sounds are normal.     Palpations: Abdomen is soft.  Musculoskeletal: Normal range of motion.  Skin:    General: Skin is warm and dry.  Neurological:     Mental Status: He is alert and oriented to person, place, and time.      ED Treatments / Results  Labs (all labs ordered are listed, but only abnormal results are displayed) Labs Reviewed  CBC WITH DIFFERENTIAL/PLATELET - Abnormal; Notable for the following components:      Result Value   WBC 11.2 (*)    RBC 2.74 (*)    Hemoglobin 9.4 (*)    HCT 28.7 (*)    MCV 104.7 (*)    MCH 34.3 (*)    RDW 16.1 (*)    Neutro Abs 9.4 (*)    Abs Immature Granulocytes 0.11 (*)    All other components within normal limits  COMPREHENSIVE METABOLIC PANEL - Abnormal; Notable for the following components:   BUN 65 (*)    Creatinine, Ser 13.05 (*)    Calcium 8.5 (*)    GFR calc non Af Amer 4 (*)    GFR calc Af Amer 4 (*)    Anion gap 16 (*)    All other components within normal limits  TROPONIN I (HIGH SENSITIVITY) - Abnormal; Notable for the following components:   Troponin I (High Sensitivity) 28 (*)    All other components within normal limits  SARS CORONAVIRUS 2 (HOSPITAL ORDER, [facility])  TROPONIN I (HIGH SENSITIVITY)    EKG EKG Interpretation  Date/Time:  Monday February 14 2019 00:09:45 EDT Ventricular Rate:  118 PR Interval:    QRS Duration: 101 QT Interval:  315 QTC Calculation: 442  R Axis:   86 Text Interpretation:  Sinus tachycardia LAE, consider biatrial enlargement RSR' in V1 or V2, right VCD or RVH ST elev, probable normal early repol pattern Confirmed by Orpah Greek [phone]) on 02/14/2019 1:52:01 AM   Radiology Dg Chest Port 1 View  Result Date: 02/14/2019 CLINICAL DATA:  CHF EXAM: PORTABLE CHEST 1  VIEW COMPARISON:  02/08/2019 FINDINGS: Interstitial and lower lobe predominant airspace opacities in the lungs bilaterally, suggesting moderate interstitial edema, less likely pneumonia. Small bilateral pleural effusions, right greater than left. No pneumothorax. Cardiomegaly. IMPRESSION: Cardiomegaly with suspected moderate interstitial edema. Small bilateral pleural effusions, right greater than left. Electronically Signed   By: Julian Hy M.D.   On: 02/14/2019 00:33    Procedures Procedures (including critical care time)  CRITICAL CARE Performed by: Larene Pickett   Total critical care time: 40 minutes  Critical care time was exclusive of separately billable procedures and treating other patients.  Critical care was necessary to treat or prevent imminent or life-threatening deterioration.  Critical care was time spent personally by me on the following activities: development of treatment plan with patient and/or surrogate as well as nursing, discussions with consultants, evaluation of patient's response to treatment, examination of patient, obtaining history from patient or surrogate, ordering and performing treatments and interventions, ordering and review of laboratory studies, ordering and review of radiographic studies, pulse oximetry and re-evaluation of patient's condition.   Medications Ordered in ED Medications  levalbuterol (XOPENEX) nebulizer solution 1.25 mg (has no administration in time range)  nicotine (NICODERM CQ - dosed in mg/24 hours) patch 21 mg (has no administration in time range)  hydrALAZINE (APRESOLINE) injection 5 mg (has no administration in time range)  aspirin EC tablet 81 mg (has no administration in time range)  heparin injection 5,000 Units (has no administration in time range)  acetaminophen (TYLENOL) tablet 650 mg (has no administration in time range)    Or  acetaminophen (TYLENOL) suppository 650 mg (has no administration in time range)   ondansetron (ZOFRAN) tablet 4 mg (has no administration in time range)    Or  ondansetron (ZOFRAN) injection 4 mg (has no administration in time range)  nitroGLYCERIN (NITROSTAT) SL tablet 0.4 mg (0.4 mg Sublingual Given 02/14/19 0106)     Initial Impression / Assessment and Plan / ED Course  I have reviewed the triage vital signs and the nursing notes.  Pertinent labs & imaging results that were available during my care of the patient were reviewed by me and considered in my medical decision making (see chart for details).    53 year old male here with acute respiratory distress.  This is likely due to volume overload.  He denies missing any recent dialysis sessions.  Patient is familiar to myself from prior admission earlier in the month.  He is hypertensive, rales throughout, tripoding, and in distress.  Will give nitro to try and reduce preload.  He is oxygenating fairly well on nonrebreather but still appears very labored.  Will discuss with respiratory therapy about starting BiPAP as he has had multiple negative COVID tests thus far.  12:39 AM Spoke with RT about bipap given his appearance-- will not allow [place] to start in ED until COVID test resulted.  He has had 4 negative COVID tests thus far, most recent was 7/13 when I admitted him for same.    1:43 AM Patient looks somewhat improved after NTG.  His BP and HR have trended down.  RR in the low 20's,  less distressed.  Continues oxygenating well.  Labs overall reassuring.  Trop 28 but suspect this is due to his CKD.  COVID screen pending.  Patient will require admission.  COVID screen negative.  Patient becoming more somnolent but arouseable.  Will start on BiPAP.  Discussed with Dr. Blaine Hamper-- will admit for ongoing care.    Discussed with Dr. Jonnie Finner-- will help arrange dialysis this morning.  Final Clinical Impressions(s) / ED Diagnoses   Final diagnoses:  Acute pulmonary edema [facility])    ED Discharge Orders    None        Larene Pickett, PA-C 02/14/19 0437    Orpah Greek, MD 02/15/19 [phone]

## 2019-02-14 NOTE — ED Triage Notes (Signed)
Pt bib  [facility] d/t [facility]. Pt w/ hx of CHF.  Has used rescue inhalers all day w/o relief. Pt was non compliant w/ CPAP for EMS.

## 2019-02-15 DIAGNOSIS — J810 Acute pulmonary edema: Secondary | ICD-10-CM | POA: Diagnosis not present

## 2019-02-15 DIAGNOSIS — I5033 Acute on chronic diastolic (congestive) heart failure: Secondary | ICD-10-CM

## 2019-02-15 DIAGNOSIS — I120 Hypertensive chronic kidney disease with stage 5 chronic kidney disease or end stage renal disease: Secondary | ICD-10-CM | POA: Diagnosis not present

## 2019-02-15 DIAGNOSIS — J9601 Acute respiratory failure with hypoxia: Secondary | ICD-10-CM | POA: Diagnosis not present

## 2019-02-15 DIAGNOSIS — D631 Anemia in chronic kidney disease: Secondary | ICD-10-CM | POA: Diagnosis not present

## 2019-02-15 DIAGNOSIS — Z992 Dependence on renal dialysis: Secondary | ICD-10-CM | POA: Diagnosis not present

## 2019-02-15 DIAGNOSIS — N186 End stage renal disease: Secondary | ICD-10-CM | POA: Diagnosis not present

## 2019-02-15 DIAGNOSIS — Z720 Tobacco use: Secondary | ICD-10-CM | POA: Diagnosis not present

## 2019-02-15 DIAGNOSIS — E875 Hyperkalemia: Secondary | ICD-10-CM | POA: Diagnosis not present

## 2019-02-15 LAB — BASIC METABOLIC PANEL
Anion gap: 15 (ref 5–15)
BUN: 55 mg/dL — ABNORMAL HIGH (ref 6–20)
CO2: 23 mmol/L (ref 22–32)
Calcium: 8.8 mg/dL — ABNORMAL LOW (ref 8.9–10.3)
Chloride: 98 mmol/L (ref 98–111)
Creatinine, Ser: 9.91 mg/dL — ABNORMAL HIGH (ref 0.61–1.24)
GFR calc Af Amer: 6 mL/min — ABNORMAL LOW (ref >60)
GFR calc non Af Amer: 5 mL/min — ABNORMAL LOW (ref >60)
Glucose, Bld: 143 mg/dL — ABNORMAL HIGH (ref 70–99)
Potassium: 4 mmol/L (ref 3.5–5.1)
Sodium: 136 mmol/L (ref 135–145)

## 2019-02-15 LAB — HEPATITIS B SURFACE ANTIGEN: Hepatitis B Surface Ag: NEGATIVE

## 2019-02-15 LAB — GLUCOSE, CAPILLARY: Glucose-Capillary: 118 mg/dL — ABNORMAL HIGH (ref 70–99)

## 2019-02-15 MED ORDER — PREDNISONE 20 MG PO TABS: 20.0000 mg | ORAL_TABLET | Freq: Every day | ORAL | 0 refills | Status: DC

## 2019-02-15 MED ORDER — DARBEPOETIN ALFA 100 MCG/0.5ML IJ SOSY: 100.0000 ug | PREFILLED_SYRINGE | INTRAMUSCULAR | Status: DC

## 2019-02-15 NOTE — Care Management Obs Status (Signed)
[facility] NOTIFICATION   Patient Details  Name: CECILIO OHLRICH MRN: [id number] Date of Birth: 1966-06-25   Medicare Observation Status Notification Given:  Yes    Sharin Mons, RN 02/15/2019, 1:28 PM

## 2019-02-15 NOTE — Progress Notes (Signed)
Renal Navigator notified OP HD clinic/[facility] of plan for discharge today in order to provide continuity of care.Alphonzo Cruise, [place] Renal Navigator [phone]

## 2019-02-15 NOTE — Consult Note (Signed)
   [facility] CM Inpatient Consult   02/15/2019  Darrell Johnson 25-Jun-1966 [id number]    Patient screened for extreme high risk score for unplanned readmission score for hospitalizations to check if potential [place] Management services are needed.   Patient had been active with [facility].  Spoke with the patient at the hospital phone. Patient consents to ongoing [facility] Management follow up post hospital.  Review of patient's medical record reveals per MD progress notes:  patient is 53 year old obese male with history of hypertension, tobacco abuse, ESRD on hemodialysis (M, W, F) at [facility] center, HIV disease with last CD4 of 428, anxiety and depression, remote seizures and anemia of chronic disease presented with increasing shortness of breath progressive for the past few weeks. Reports having some dry cough but no fevers or chills. Had some substernal chest pain which resolved by the time he came to the ED.  Primary Care Provider is  Juluis Mire, NP at [facility].  Pharmacy is:  [facility]  Patient states medications are "good."  Transportation to provider: no needs Plan:  Patient to be assigned to [facility] Management Coordinator for Transition of Care follow up for complex disease management needs.    For questions contact:   Natividad Brood, RN BSN [facility] Liaison  [phone] business mobile phone Toll free office [phone]  Fax number: [phone] [email or web address]

## 2019-02-15 NOTE — Discharge Summary (Signed)
Follow physician Discharge Summary  Darrell Johnson NUU:[id number] DOB: 12-02-1965 DOA: 02/14/2019  PCP: Kerin Perna, NP  Admit date: 02/14/2019 Discharge date: 02/15/2019  Admitted From: Home Disposition: Home  Recommendations for Outpatient Follow-up:  1. Follow up with PCP in 1-2 weeks 2. PCP has recently made outpatient referral for sleep study and pulmonary.  Please schedule for outpatient PFT. 3. Patient being discharged on oral prednisone taper over the next 10-12 days.  Home Health: None Equipment/Devices: None  Discharge Condition: Fair CODE STATUS: Full code Diet recommendation: Heart Healthy/renal    Discharge Diagnoses:  Principal Problem:   Acute respiratory failure with hypoxia (HCC)   Active Problems:   HIV disease ([place])   Essential hypertension   Tobacco abuse   ESRD on dialysis ([place])   Anemia in chronic kidney disease   Hyperlipidemia   Pulmonary edema   GERD (gastroesophageal reflux disease)   Depression   Elevated troponin   Acute on chronic diastolic (congestive) heart failure (HCC)   Brief narrative/HPI 53 year old obese male with history of hypertension, tobacco abuse, ESRD on hemodialysis (M, W, F), HIV disease with last CD4 of 428, anxiety and depression, remote seizures and anemia of chronic disease presented with increasing shortness of breath progressive for the past few weeks.  Reports having some dry cough but no fevers or chills.  Had some substernal chest pain which resolved by the time he came to the ED. In the ED he had a BC of 12 K, high-sensitivity troponin of 28, tested negative for COVID-19.  BUN of 65 and creatinine of 13.05.  Blood pressure was elevated 162/83 mmHg, was tachycardic, tachypneic with O2 sat 90% room air requiring BiPAP for respiratory distress.  Chest x-ray showed pulmonary edema and bilateral small pleural effusion. Placed on stepdown unit and nephrology consulted.  Hospital course   Principal Problem:    Acute respiratory failure with hypoxia (HCC) Likely combination of acute on chronic diastolic CHF with pulmonary edema and possible underlying COPD (undiagnosed).    Patient off BiPAP and once completed dialysis was much more stable and maintain sats on room air. 2D echo done with normal EF and grade 2 diastolic dysfunction.  No wall motion abnormality. Patient received prednisone and nebs while in the hospital.  He will be discharged on oral prednisone taper over the next 12 days.  He is prescribed albuterol and Symbicort inhaler by his PCP which he should continue.  He was also prescribed DuoNeb by his PCP which I encouraged him to take as needed for shortness of breath. As per his PCP visit earlier this month he was in clinic for being referred to sleep clinic for possible obstructive sleep apnea and also for outpatient pulmonary.  Recommend referral for outpatient PFT.   Active Problems: Essential hypertension Elevated blood pressure on presentation.    Resumed home medication including amlodipine, Coreg, hydralazine and lisinopril.  HIV disease with CD4 of 428 Stable.  Continue home meds  Tobacco abuse Counseled strongly on cessation.  Patient plans to quit.  ESRD on hemodialysis (M, W, F) Receive scheduled hemodialysis and symptoms improved.  Renal consult appreciated.  Chronic depression Continue Wellbutrin and amitriptyline  Hyperlipidemia Continue statin  Anemia of chronic kidney disease Continue iron supplement.  Hemoglobin stable  GERD Continue Pepcid  Elevated troponin Appears to be chronic.  No further symptoms.  Monitor on telemetry   Patient admitted as inpatient given his severe respiratory distress and acute hypoxic respiratory failure requiring BiPAP.  He has significantly  improved within 24 hours with breathing treatment and dialysis and does not need further hospital stay.     Disposition Plan  :  Home  Consults  : Renal  Procedures  :  Scheduled dialysis   Discharge Instructions   Allergies as of 02/15/2019   No Known Allergies     Medication List    STOP taking these medications   azithromycin 250 MG tablet Commonly known as: ZITHROMAX   famotidine 40 MG tablet Commonly known as: Pepcid     TAKE these medications   albuterol 108 (90 Base) MCG/ACT inhaler Commonly known as: VENTOLIN HFA Inhale 2 puffs into the lungs every 4 (four) hours as needed for up to 30 days for wheezing or shortness of breath.   amitriptyline 25 MG tablet Commonly known as: ELAVIL TAKE 1 TABLET BY MOUTH EVERYDAY AT BEDTIME What changed: See the new instructions.   amLODipine 10 MG tablet Commonly known as: NORVASC Take 1 tablet (10 mg total) by mouth daily.   azelastine 0.05 % ophthalmic solution Commonly known as: OPTIVAR Place 1 drop into both eyes daily.   budesonide-formoterol 160-4.5 MCG/ACT inhaler Commonly known as: SYMBICORT Inhale 2 puffs into the lungs 2 (two) times daily.   buPROPion 100 MG 12 hr tablet Commonly known as: WELLBUTRIN SR TAKE 1 TABLET BY MOUTH TWICE A DAY   carvedilol 25 MG tablet Commonly known as: COREG Take 1 tablet (25 mg total) by mouth 2 (two) times daily with a meal.   Dolutegravir-Rilpivirine 50-25 MG Tabs Commonly known as: Juluca Take 1 tablet by mouth daily with supper.   doxazosin 8 MG tablet Commonly known as: CARDURA Take 1 tablet (8 mg total) by mouth at bedtime.   ferric citrate 1 GM 210 MG(Fe) tablet Commonly known as: AURYXIA Take 420 mg by mouth 3 (three) times daily with meals.   fluticasone 50 MCG/ACT nasal spray Commonly known as: FLONASE Place 1 spray into both nostrils as needed for allergies.   furosemide 40 MG tablet Commonly known as: LASIX TAKE 1 TABLET (40 MG TOTAL) BY MOUTH DAILY AS NEEDED FOR FLUID.   hydrALAZINE 100 MG tablet Commonly known as: APRESOLINE Take 100 mg by mouth 3 (three) times daily.   ipratropium-albuterol 0.5-2.5 (3) MG/3ML  Soln Commonly known as: DUONEB Take 3 mLs by nebulization every 6 (six) hours as needed.   lisinopril 10 MG tablet Commonly known as: ZESTRIL Take 10 mg by mouth daily.   montelukast 10 MG tablet Commonly known as: SINGULAIR Take 10 mg by mouth at bedtime.   Nicotrol 10 MG inhaler Generic drug: nicotine Inhale 1 continuous puffing into the lungs as needed for smoking cessation.   pravastatin 40 MG tablet Commonly known as: PRAVACHOL TAKE 1 TABLET BY MOUTH EVERYDAY AT BEDTIME What changed: See the new instructions.   predniSONE 20 MG tablet Commonly known as: DELTASONE Take 2 tablets (40 mg total) by mouth daily. What changed: Another medication with the same name was added. Make sure you understand how and when to take each.   predniSONE 20 MG tablet Commonly known as: DELTASONE Take 1 tablet (20 mg total) by mouth daily with breakfast. What changed: You were already taking a medication with the same name, and this prescription was added. Make sure you understand how and when to take each.   Velphoro 500 MG chewable tablet Generic drug: sucroferric oxyhydroxide Chew 500 mg by mouth 3 (three) times daily with meals.      Follow-up Information  Kerin Perna, NP Follow up in 1 week(s).   Specialty: Internal Medicine Contact information: [place] [address] [phone]          No Known Allergies      Procedures/Studies: Dg Chest 2 View  Result Date: 02/08/2019 CLINICAL DATA:  Shortness of breath since yesterday. EXAM: CHEST - 2 VIEW COMPARISON:  01/31/2019. FINDINGS: Mildly enlarged cardiac silhouette with an interval decrease in size. Decreased prominence of the pulmonary vasculature and interstitial markings. Small amount of residual right pleural thickening/fluid. Stable linear atelectasis or scarring in the right mid and lower lung zones. Mild thoracic spine degenerative changes. IMPRESSION: 1. Improved changes of congestive  heart failure. 2. Small amount of residual right pleural thickening/fluid. 3. Stable linear atelectasis or scarring in the right mid and lower lung zones. Electronically Signed   By: Claudie Revering M.D.   On: 02/08/2019 11:54   Dg Chest Port 1 View  Result Date: 02/14/2019 CLINICAL DATA:  CHF EXAM: PORTABLE CHEST 1 VIEW COMPARISON:  02/08/2019 FINDINGS: Interstitial and lower lobe predominant airspace opacities in the lungs bilaterally, suggesting moderate interstitial edema, less likely pneumonia. Small bilateral pleural effusions, right greater than left. No pneumothorax. Cardiomegaly. IMPRESSION: Cardiomegaly with suspected moderate interstitial edema. Small bilateral pleural effusions, right greater than left. Electronically Signed   By: Julian Hy M.D.   On: 02/14/2019 00:33   Dg Chest Port 1 View  Result Date: 01/31/2019 CLINICAL DATA:  Shortness of breath EXAM: PORTABLE CHEST 1 VIEW COMPARISON:  01/23/2019 FINDINGS: Borderline cardiomegaly.  Stable mediastinal contours. Airspace opacity asymmetric to the right where there is chronic pleural thickening in the setting of prior rib fractures. No acute pleural effusion. No pneumothorax. IMPRESSION: 1. Bilateral airspace disease which could be edema or infection. 2. Right fibrothorax. Electronically Signed   By: Monte Fantasia M.D.   On: 01/31/2019 06:43   Dg Chest Portable 1 View  Result Date: 01/23/2019 CLINICAL DATA:  Shortness of breath for 1 day. EXAM: PORTABLE CHEST 1 VIEW COMPARISON:  Radiograph 01/13/2019, additional priors. FINDINGS: Slight decrease in cardiomegaly from prior exam. Right pleural effusion and basilar opacity appears similar to prior exam. Vascular congestion without pulmonary edema. Linear scarring in the right mid lung. No focal airspace disease or pneumothorax. IMPRESSION: 1. Slight decrease in cardiomegaly from prior exam. 2. Unchanged right pleural effusion and basilar opacity, which may be due to scarring or  atelectasis. Electronically Signed   By: Keith Rake M.D.   On: 01/23/2019 21:08    (Echo, Carotid, EGD, Colonoscopy, ERCP)    Subjective:   Discharge Exam: Vitals:   02/15/19 0750 02/15/19 0845  BP: (!) 144/73 (!) 161/75  Pulse: 90 92  Resp: 19 18  Temp: 98.6 F (37 C) 99.2 F (37.3 C)  SpO2: 100% 100%   Vitals:   02/14/19 2300 02/15/19 0316 02/15/19 0750 02/15/19 0845  BP: (!) 165/72  (!) 144/73 (!) 161/75  Pulse: 89 78 90 92  Resp: 16 18 19 18   Temp: 98.9 F (37.2 C)  98.6 F (37 C) 99.2 F (37.3 C)  TempSrc: Oral  Oral Oral  SpO2: 100% 99% 100% 100%  Weight:      Height:        General: Not in distress HEENT: Moist mucosa, supple neck Chest: Clear bilaterally CVs: Normal S1 and S2 GI: Soft, nondistended, nontender Musculoskeletal: Warm, no edema     The results of significant diagnostics from this hospitalization (including imaging, microbiology, ancillary and  laboratory) are listed below for reference.     Microbiology: Recent Results (from the past 240 hour(s))  SARS Coronavirus 2 (CEPHEID- Performed in [place] hospital lab), Hosp Order     Status: None   Collection Time: 02/14/19 12:36 AM   Specimen: Nasopharyngeal Swab  Result Value Ref Range Status   SARS Coronavirus 2 NEGATIVE NEGATIVE Final    Comment: (NOTE) If result is NEGATIVE SARS-CoV-2 target nucleic acids are NOT DETECTED. The SARS-CoV-2 RNA is generally detectable in upper and lower  respiratory specimens during the acute phase of infection. The lowest  concentration of SARS-CoV-2 viral copies this assay can detect is 250  copies / mL. A negative result does not preclude SARS-CoV-2 infection  and should not be used as the sole basis for treatment or other  patient management decisions.  A negative result may occur with  improper specimen collection / handling, submission of specimen other  than nasopharyngeal swab, presence of viral mutation(s) within the  areas targeted by  this assay, and inadequate number of viral copies  (<250 copies / mL). A negative result must be combined with clinical  observations, patient history, and epidemiological information. If result is POSITIVE SARS-CoV-2 target nucleic acids are DETECTED. The SARS-CoV-2 RNA is generally detectable in upper and lower  respiratory specimens dur ing the acute phase of infection.  Positive  results are indicative of active infection with SARS-CoV-2.  Clinical  correlation with patient history and other diagnostic information is  necessary to determine patient infection status.  Positive results do  not rule out bacterial infection or co-infection with other viruses. If result is PRESUMPTIVE POSTIVE SARS-CoV-2 nucleic acids MAY BE PRESENT.   A presumptive positive result was obtained on the submitted specimen  and confirmed on repeat testing.  While 2019 novel coronavirus  (SARS-CoV-2) nucleic acids may be present in the submitted sample  additional confirmatory testing may be necessary for epidemiological  and / or clinical management purposes  to differentiate between  SARS-CoV-2 and other Sarbecovirus currently known to infect humans.  If clinically indicated additional testing with an alternate test  methodology [phone]) is advised. The SARS-CoV-2 RNA is generally  detectable in upper and lower respiratory sp ecimens during the acute  phase of infection. The expected result is Negative. Fact Sheet for Patients:  StrictlyIdeas.no Fact Sheet for Healthcare Providers: BankingDealers.co.za This test is not yet approved or cleared by the [place] FDA and has been authorized for detection and/or diagnosis of SARS-CoV-2 by FDA under an Emergency Use Authorization (EUA).  This EUA will remain in effect (meaning this test can be used) for the duration of the COVID-19 declaration under Section 564(b)(1) of the Act, 21 U.S.C. section  360bbb-3(b)(1), unless the authorization is terminated or revoked sooner. Performed at [facility], [place] [address], [place], [place] [address]   MRSA PCR Screening     Status: None   Collection Time: 02/14/19  4:38 AM   Specimen: Nasal Mucosa; Nasopharyngeal  Result Value Ref Range Status   MRSA by PCR NEGATIVE NEGATIVE Final    Comment:        The GeneXpert MRSA Assay (FDA approved for NASAL specimens only), is one component of a comprehensive MRSA colonization surveillance program. It is not intended to diagnose MRSA infection nor to guide or monitor treatment for MRSA infections. Performed at [facility], [place] [address] [place],  [address]      Labs: BNP (last 3 results) Recent Labs  01/13/19 1207 01/23/19 2152 02/08/19 1118  BNP 472.3* 718.6* 295.2*   Basic Metabolic Panel: Recent Labs  Lab 02/14/19 0048 02/14/19 0620 02/15/19 0932  NA 139 139 136  K 4.6 5.4* 4.0  CL 98 96* 98  CO2 25 27 23   GLUCOSE 96 82 143*  BUN 65* 70* 55*  CREATININE 13.05* 13.57* 9.91*  CALCIUM 8.5* 8.6* 8.8*   Liver Function Tests: Recent Labs  Lab 02/14/19 0048  AST 21  ALT 23  ALKPHOS 81  BILITOT 0.9  PROT 6.7  ALBUMIN 3.6   No results for input(s): LIPASE, AMYLASE in the last 168 hours. No results for input(s): AMMONIA in the last 168 hours. CBC: Recent Labs  Lab 02/14/19 0048 02/14/19 0620 02/14/19 0827  WBC 11.2* 8.3 7.9  NEUTROABS 9.4*  --   --   HGB 9.4* 8.5* 8.4*  HCT 28.7* 25.5* 26.0*  MCV 104.7* 104.5* 105.7*  PLT 151 132* 129*   Cardiac Enzymes: No results for input(s): CKTOTAL, CKMB, CKMBINDEX, TROPONINI in the last 168 hours. BNP: Invalid input(s): POCBNP CBG: Recent Labs  Lab 02/14/19 0749 02/15/19 0748  GLUCAP 72 118*   D-Dimer No results for input(s): DDIMER in the last 72 hours. Hgb A1c Recent Labs    02/14/19 0620  HGBA1C 5.1   Lipid Profile Recent Labs    02/14/19 0620  CHOL 127  HDL 32*  LDLCALC 58   TRIG 185*  CHOLHDL 4.0   Thyroid function studies No results for input(s): TSH, T4TOTAL, T3FREE, THYROIDAB in the last 72 hours.  Invalid input(s): FREET3 Anemia work up No results for input(s): VITAMINB12, FOLATE, FERRITIN, TIBC, IRON, RETICCTPCT in the last 72 hours. Urinalysis    Component Value Date/Time   COLORURINE YELLOW 07/06/2013 1808   APPEARANCEUR CLEAR 07/06/2013 1808   LABSPEC 1.013 07/06/2013 1808   PHURINE 7.5 07/06/2013 1808   GLUCOSEU NEGATIVE 07/06/2013 1808   GLUCOSEU NEG mg/dL 11/01/2009 2123   HGBUR SMALL (A) 07/06/2013 1808   BILIRUBINUR NEGATIVE 07/06/2013 1808   KETONESUR NEGATIVE 07/06/2013 1808   PROTEINUR >300 (A) 07/06/2013 1808   UROBILINOGEN 0.2 07/06/2013 1808   NITRITE NEGATIVE 07/06/2013 1808   LEUKOCYTESUR NEGATIVE 07/06/2013 1808   Sepsis Labs Invalid input(s): PROCALCITONIN,  WBC,  LACTICIDVEN Microbiology Recent Results (from the past 240 hour(s))  SARS Coronavirus 2 (CEPHEID- Performed in [place] hospital lab), Hosp Order     Status: None   Collection Time: 02/14/19 12:36 AM   Specimen: Nasopharyngeal Swab  Result Value Ref Range Status   SARS Coronavirus 2 NEGATIVE NEGATIVE Final    Comment: (NOTE) If result is NEGATIVE SARS-CoV-2 target nucleic acids are NOT DETECTED. The SARS-CoV-2 RNA is generally detectable in upper and lower  respiratory specimens during the acute phase of infection. The lowest  concentration of SARS-CoV-2 viral copies this assay can detect is 250  copies / mL. A negative result does not preclude SARS-CoV-2 infection  and should not be used as the sole basis for treatment or other  patient management decisions.  A negative result may occur with  improper specimen collection / handling, submission of specimen other  than nasopharyngeal swab, presence of viral mutation(s) within the  areas targeted by this assay, and inadequate number of viral copies  (<250 copies / mL). A negative result must be combined  with clinical  observations, patient history, and epidemiological information. If result is POSITIVE SARS-CoV-2 target nucleic acids are DETECTED. The SARS-CoV-2 RNA is generally detectable in upper and lower  respiratory specimens dur ing the acute phase of infection.  Positive  results are indicative of active infection with SARS-CoV-2.  Clinical  correlation with patient history and other diagnostic information is  necessary to determine patient infection status.  Positive results do  not rule out bacterial infection or co-infection with other viruses. If result is PRESUMPTIVE POSTIVE SARS-CoV-2 nucleic acids MAY BE PRESENT.   A presumptive positive result was obtained on the submitted specimen  and confirmed on repeat testing.  While 2019 novel coronavirus  (SARS-CoV-2) nucleic acids may be present in the submitted sample  additional confirmatory testing may be necessary for epidemiological  and / or clinical management purposes  to differentiate between  SARS-CoV-2 and other Sarbecovirus currently known to infect humans.  If clinically indicated additional testing with an alternate test  methodology [phone]) is advised. The SARS-CoV-2 RNA is generally  detectable in upper and lower respiratory sp ecimens during the acute  phase of infection. The expected result is Negative. Fact Sheet for Patients:  StrictlyIdeas.no Fact Sheet for Healthcare Providers: BankingDealers.co.za This test is not yet approved or cleared by the [place] FDA and has been authorized for detection and/or diagnosis of SARS-CoV-2 by FDA under an Emergency Use Authorization (EUA).  This EUA will remain in effect (meaning this test can be used) for the duration of the COVID-19 declaration under Section 564(b)(1) of the Act, 21 U.S.C. section 360bbb-3(b)(1), unless the authorization is terminated or revoked sooner. Performed at [facility], [place] [address] [place], [place] [address]   MRSA PCR Screening     Status: None   Collection Time: 02/14/19  4:38 AM   Specimen: Nasal Mucosa; Nasopharyngeal  Result Value Ref Range Status   MRSA by PCR NEGATIVE NEGATIVE Final    Comment:        The GeneXpert MRSA Assay (FDA approved for NASAL specimens only), is one component of a comprehensive MRSA colonization surveillance program. It is not intended to diagnose MRSA infection nor to guide or monitor treatment for MRSA infections. Performed at [facility], [place] [address], [place],  [address]      Time coordinating discharge: 25 minutes  SIGNED:   Louellen Molder, MD  [facility] 02/15/2019, 12:01 PM Pager   If 7PM-7AM, please contact night-coverage [email or web address] Password [email or web address]

## 2019-02-15 NOTE — Care Management CC44 (Signed)
Condition Code 44 Documentation Completed  Patient Details  Name: Darrell Johnson MRN: [id number] Date of Birth: 09-04-1965   Condition Code 44 given:  Yes Patient signature on Condition Code 44 notice:  Yes Documentation of 2 MD's agreement:  Yes Code 44 added to claim:  Yes    Sharin Mons, RN 02/15/2019, 1:28 PM

## 2019-02-15 NOTE — Progress Notes (Signed)
[facility] Progress Note  Name: Darrell Johnson MRN: [id number] DOB: 1966/07/06  Chief Complaint:  Shortness of breath   Subjective:  S/p HD on 7/27 with 4 kg UF.  Doesn't feel like he has extra fluid on.  Confirmed that he is not on oxygen at home.  Has been on 3 liters with sats 99%.   Review of systems:  Shortness of breath is better.  No chest pain Denies n/v   --------------------------------- Background on consult;  Darrell Johnson is a 53 y.o. adult male with a history of ESRD on HD MWF at [facility] ([facility]) who presented to the hospital with respiratory distress.  He had tried inhalers at home and has not gotten any relief; states he was told of a diagnosis of COPD recently.  He was felt to have fluid overload.  He was initiated on BIPAP.  Nephrology was consulted for assistance with dialysis.  Note that he has previous admissions for same.  He reports compliance with outpatient dialysis.  States he first had shortness of breath on Saturday.   States he normally pulls a little closer to 2-2.5 kg.  He was reduced from BIPAP to 4 liters oxygen prior to HD.  With regard to anemia, denies blood per rectum or dark stools.   SARS coronavirus 2 test is negative this admission on 7/27 as well as other recent tests two, three, and four weeks ago.     Intake/Output Summary (Last 24 hours) at 02/15/2019 0722 Last data filed at 02/14/2019 1325 Gross per 24 hour  Intake 118 ml  Output 4000 ml  Net -3882 ml    Vitals:  Vitals:   02/14/19 1628 02/14/19 2058 02/14/19 2300 02/15/19 0316  BP: (!) 177/71  (!) 165/72   Pulse: 99 88 89 78  Resp: (!) 21 16 16 18   Temp: 99.3 F (37.4 C)  98.9 F (37.2 C)   TempSrc: Oral  Oral   SpO2: 99% 100% 100% 99%  Weight:      Height:         Physical Exam:  General adult male in bed in no acute distress on supplemental oxygen HEENT normocephalic atraumatic extraocular movements intact sclera anicteric Neck supple trachea  midline Lungs occ wheeze with normal work of breathing at rest  Heart S1S2; no rub Abdomen soft nontender distended/obese habitus  Extremities no pitting edema  Psych normal mood and affect Access: [facility] with bruit and thrill   Medications reviewed   Labs:  BMP Latest Ref Rng & Units 02/14/2019 02/14/2019 02/08/2019  Glucose 70 - 99 mg/dL 82 96 82  BUN 6 - 20 mg/dL 70(H) 65(H) 36(H)  Creatinine 0.61 - 1.24 mg/dL 13.57(H) 13.05(H) 9.46(H)  BUN/Creat Ratio 6 - 22 (calc) - - -  Sodium 135 - 145 mmol/L 139 139 138  Potassium 3.5 - 5.1 mmol/L 5.4(H) 4.6 4.0  Chloride 98 - 111 mmol/L 96(L) 98 96(L)  CO2 22 - 32 mmol/L 27 25 28   Calcium 8.9 - 10.3 mg/dL 8.6(L) 8.5(L) 8.8(L)    Outpatient HD orders:  MWF at [facility] via [facility]  4.5 hours  BF 475; DF autoflow 2.0 2 K /2 Ca EDW 111 kg  venofer 50 mg weekly  Calcitriol 3.75 mcg three times a week  parsabiv 10 mg with HD three times a week  mircera 100 mcg every 2 weeks; last on 7/15   Assessment/Plan:   # End-stage renal disease - On hemodialysis.  HD  per MWF schedule  - BMP ordered  - Will need to lower EDW on discharge - he was 110 kg post-HD 7/27 and may need to lower further  # Acute hypoxic respiratory failure - Oxygen support as needed - UF with dialysis as tolerated - optimize regimen for COPD per primary team  - noted on prednisone    #Hypertension - UF with HD as above - Continue home regimen - last vitals charted 2201 on 7/27  # hyperkalemia - on HD and renal diet  # Anemia 2/2 CKD  - Plan for Aranesp 100 mcg once on 7/29; last mircera 7/15 and due again 7/29  Claudia Desanctis, MD 02/15/2019 7:22 AM

## 2019-02-16 ENCOUNTER — Other Ambulatory Visit: Payer: Self-pay

## 2019-02-16 ENCOUNTER — Other Ambulatory Visit: Payer: Self-pay | Admitting: *Deleted

## 2019-02-16 DIAGNOSIS — D631 Anemia in chronic kidney disease: Secondary | ICD-10-CM | POA: Diagnosis not present

## 2019-02-16 DIAGNOSIS — D509 Iron deficiency anemia, unspecified: Secondary | ICD-10-CM | POA: Diagnosis not present

## 2019-02-16 DIAGNOSIS — N186 End stage renal disease: Secondary | ICD-10-CM | POA: Diagnosis not present

## 2019-02-16 DIAGNOSIS — N2581 Secondary hyperparathyroidism of renal origin: Secondary | ICD-10-CM | POA: Diagnosis not present

## 2019-02-16 NOTE — Patient Outreach (Signed)
[place] [facility]) Care Management  02/16/2019  ANDRIK SANDT 04/08/1966 [id number]    Discharge 7/28 Primary provider to completed transition of care Referral received 02/16/2019 Initial Outreach 02/16/2019  RN attempted outreach call today to pt however unsuccessful. RN unable to eave a message however will attempted another call back within 4 business days for pending services with [facility].  Raina Mina, RN Care Management Coordinator [place] Office [phone]

## 2019-02-16 NOTE — Patient Outreach (Signed)
[place] [facility]) Care Management  02/16/2019  JAVONE YBANEZ [birth year] [id number]   RN Health Coach received notification the patient was admitted to the hospital on 7/27 for Acute Respiratory failure.    Plan: RN Health Coach will close the case due to admission.  Lazaro Arms RN, BSN, [place] Direct Dial:  [phone]  Fax: [phone]

## 2019-02-17 ENCOUNTER — Ambulatory Visit: Payer: Self-pay

## 2019-02-18 DIAGNOSIS — N186 End stage renal disease: Secondary | ICD-10-CM | POA: Diagnosis not present

## 2019-02-18 DIAGNOSIS — D509 Iron deficiency anemia, unspecified: Secondary | ICD-10-CM | POA: Diagnosis not present

## 2019-02-18 DIAGNOSIS — D631 Anemia in chronic kidney disease: Secondary | ICD-10-CM | POA: Diagnosis not present

## 2019-02-18 DIAGNOSIS — N2581 Secondary hyperparathyroidism of renal origin: Secondary | ICD-10-CM | POA: Diagnosis not present

## 2019-02-19 DIAGNOSIS — I120 Hypertensive chronic kidney disease with stage 5 chronic kidney disease or end stage renal disease: Secondary | ICD-10-CM | POA: Diagnosis not present

## 2019-02-19 DIAGNOSIS — N186 End stage renal disease: Secondary | ICD-10-CM | POA: Diagnosis not present

## 2019-02-19 DIAGNOSIS — Z992 Dependence on renal dialysis: Secondary | ICD-10-CM | POA: Diagnosis not present

## 2019-02-20 ENCOUNTER — Other Ambulatory Visit: Payer: Self-pay

## 2019-02-20 ENCOUNTER — Emergency Department (HOSPITAL_COMMUNITY): Payer: Medicare Other

## 2019-02-20 ENCOUNTER — Emergency Department (HOSPITAL_COMMUNITY)
Admission: EM | Admit: 2019-02-20 | Discharge: 2019-02-20 | Disposition: A | Discharge status: Home / Self Care | Payer: Medicare Other | Attending: Emergency Medicine | Admitting: Emergency Medicine

## 2019-02-20 ENCOUNTER — Encounter (HOSPITAL_COMMUNITY): Payer: Self-pay | Admitting: *Deleted

## 2019-02-20 DIAGNOSIS — Z79899 Other long term (current) drug therapy: Secondary | ICD-10-CM | POA: Insufficient documentation

## 2019-02-20 DIAGNOSIS — N186 End stage renal disease: Secondary | ICD-10-CM | POA: Insufficient documentation

## 2019-02-20 DIAGNOSIS — R079 Chest pain, unspecified: Secondary | ICD-10-CM | POA: Diagnosis not present

## 2019-02-20 DIAGNOSIS — B20 Human immunodeficiency virus [HIV] disease: Secondary | ICD-10-CM | POA: Insufficient documentation

## 2019-02-20 DIAGNOSIS — J441 Chronic obstructive pulmonary disease with (acute) exacerbation: Secondary | ICD-10-CM | POA: Diagnosis not present

## 2019-02-20 DIAGNOSIS — R062 Wheezing: Secondary | ICD-10-CM | POA: Diagnosis not present

## 2019-02-20 DIAGNOSIS — I5032 Chronic diastolic (congestive) heart failure: Secondary | ICD-10-CM | POA: Diagnosis not present

## 2019-02-20 DIAGNOSIS — F17210 Nicotine dependence, cigarettes, uncomplicated: Secondary | ICD-10-CM | POA: Insufficient documentation

## 2019-02-20 DIAGNOSIS — I132 Hypertensive heart and chronic kidney disease with heart failure and with stage 5 chronic kidney disease, or end stage renal disease: Secondary | ICD-10-CM | POA: Insufficient documentation

## 2019-02-20 DIAGNOSIS — R42 Dizziness and giddiness: Secondary | ICD-10-CM | POA: Insufficient documentation

## 2019-02-20 DIAGNOSIS — R0602 Shortness of breath: Secondary | ICD-10-CM | POA: Diagnosis not present

## 2019-02-20 DIAGNOSIS — Z992 Dependence on renal dialysis: Secondary | ICD-10-CM | POA: Diagnosis not present

## 2019-02-20 DIAGNOSIS — I10 Essential (primary) hypertension: Secondary | ICD-10-CM | POA: Diagnosis not present

## 2019-02-20 DIAGNOSIS — R05 Cough: Secondary | ICD-10-CM | POA: Diagnosis not present

## 2019-02-20 LAB — BASIC METABOLIC PANEL
Anion gap: 19 — ABNORMAL HIGH (ref 5–15)
BUN: 83 mg/dL — ABNORMAL HIGH (ref 6–20)
CO2: 22 mmol/L (ref 22–32)
Calcium: 9.3 mg/dL (ref 8.9–10.3)
Chloride: 97 mmol/L — ABNORMAL LOW (ref 98–111)
Creatinine, Ser: 12.54 mg/dL — ABNORMAL HIGH (ref 0.61–1.24)
GFR calc Af Amer: 5 mL/min — ABNORMAL LOW (ref >60)
GFR calc non Af Amer: 4 mL/min — ABNORMAL LOW (ref >60)
Glucose, Bld: 112 mg/dL — ABNORMAL HIGH (ref 70–99)
Potassium: 4.6 mmol/L (ref 3.5–5.1)
Sodium: 138 mmol/L (ref 135–145)

## 2019-02-20 LAB — CBC
HCT: 29.4 % — ABNORMAL LOW (ref 39.0–52.0)
Hemoglobin: 10.2 g/dL — ABNORMAL LOW (ref 13.0–17.0)
MCH: 37.5 pg — ABNORMAL HIGH (ref 26.0–34.0)
MCHC: 34.7 g/dL (ref 30.0–36.0)
MCV: 108.1 fL — ABNORMAL HIGH (ref 80.0–100.0)
Platelets: 203 10*3/uL (ref 150–400)
RBC: 2.72 MIL/uL — ABNORMAL LOW (ref 4.22–5.81)
RDW: 16.2 % — ABNORMAL HIGH (ref 11.5–15.5)
WBC: 9 10*3/uL (ref 4.0–10.5)
nRBC: 0.2 % (ref 0.0–0.2)

## 2019-02-20 LAB — TROPONIN I (HIGH SENSITIVITY): Troponin I (High Sensitivity): 25 ng/L — ABNORMAL HIGH (ref <18)

## 2019-02-20 MED ORDER — LISINOPRIL 10 MG PO TABS
10.0000 mg | ORAL_TABLET | Freq: Once | ORAL | Status: AC
  Administered 2019-02-20: 10 mg via ORAL
  Filled 2019-02-20: qty 1

## 2019-02-20 MED ORDER — HYDRALAZINE HCL 25 MG PO TABS
100.0000 mg | ORAL_TABLET | Freq: Once | ORAL | Status: AC
  Administered 2019-02-20: 100 mg via ORAL
  Filled 2019-02-20: qty 4

## 2019-02-20 MED ORDER — AMLODIPINE BESYLATE 5 MG PO TABS
10.0000 mg | ORAL_TABLET | Freq: Once | ORAL | Status: AC
  Administered 2019-02-20: 18:00:00 10 mg via ORAL
  Filled 2019-02-20: qty 2

## 2019-02-20 MED ORDER — AZITHROMYCIN 250 MG PO TABS: 250.0000 mg | ORAL_TABLET | Freq: Every day | ORAL | 0 refills | Status: DC

## 2019-02-20 MED ORDER — SODIUM CHLORIDE 0.9% FLUSH: 3.0000 mL | Freq: Once | INTRAVENOUS | Status: DC

## 2019-02-20 MED ORDER — ALBUTEROL SULFATE HFA 108 (90 BASE) MCG/ACT IN AERS
6.0000 | INHALATION_SPRAY | Freq: Once | RESPIRATORY_TRACT | Status: AC
  Administered 2019-02-20: 18:00:00 6 via RESPIRATORY_TRACT
  Filled 2019-02-20: qty 6.7

## 2019-02-20 MED ORDER — CARVEDILOL 12.5 MG PO TABS
25.0000 mg | ORAL_TABLET | Freq: Once | ORAL | Status: AC
  Administered 2019-02-20: 18:00:00 25 mg via ORAL
  Filled 2019-02-20: qty 2

## 2019-02-20 MED ORDER — ACETAMINOPHEN 325 MG PO TABS
650.0000 mg | ORAL_TABLET | Freq: Once | ORAL | Status: AC
  Administered 2019-02-20: 20:00:00 650 mg via ORAL
  Filled 2019-02-20: qty 2

## 2019-02-20 NOTE — ED Notes (Signed)
Patient transported to X-ray 

## 2019-02-20 NOTE — ED Notes (Signed)
Patient verbalizes understanding of discharge instructions. Opportunity for questioning and answers were provided. Armband removed by staff, pt discharged from ED.  

## 2019-02-20 NOTE — ED Triage Notes (Signed)
To ED via [facility] for eval of dizziness and sob. States he hasn't had his bp meds in 3 days was supposed to get refill today. Pt is MWF dialysis - no trouble with dialysis. Speaks in full sentences. States he didn't want his sob to get 'bad like it has before'.

## 2019-02-20 NOTE — ED Provider Notes (Signed)
[facility] EMERGENCY DEPARTMENT Provider Note   CSN: [id number] Arrival date & time: 02/20/19  1331    History   Chief Complaint Chief Complaint  Patient presents with  . Dizziness  . Shortness of Breath    HPI Darrell Johnson is a 53 y.o. adult.     HPI  53 year old male presents with shortness of breath.  Started this morning.  Feels similar to before but not as bad as when he was admitted last week.  He has not taken his blood pressure medicines and a few days but his wife has picked up his prescription this afternoon.  He has some chest burning that he states feels like reflux and has had before.  No fever.  Chronic cough/sputum production that he thinks is from his postnasal drip.  No leg swelling.  Past Medical History:  Diagnosis Date  . Anemia   . Anxiety   . Arthritis    "knees" (04/13/2017)  . Asthma   . CHF (congestive heart failure) ([place])   . ESRD (end stage renal disease) on dialysis [facility])    "MWF; [facility]" (04/13/2017)  . Gout, unspecified 08/13/2009   Qualifier: Diagnosis of  By: Redmond Pulling  MD, Mateo Flow    . H1N1 influenza    March 2016  . History of blood transfusion 02/2017   "related to OR"  . HIV infection ([place]) 1980's   on ART therapy since, followed by ID clinic, complicated  by neuropathy  . Hyperlipidemia    hypertrygliceridemia determined ti be secondary to ART therpay  . HYPERTENSION 05/08/2006  . Pneumonia    "once; years ago" (04/13/2017)  . Rib fractures 01/2009  . Seizures ([place])    last seizure was in the 1990s, pt has family history of seizures; "probably related to alcohol" (04/13/2017)  . 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 Syphilis 1997   history of syphilis 1997  . Tobacco abuse     Patient Active Problem List   Diagnosis Date Noted  . GERD (gastroesophageal reflux  disease) 02/14/2019  . Depression 02/14/2019  . Elevated troponin 02/14/2019  . Acute on chronic diastolic (congestive) heart failure ([place]) 02/14/2019  . Pulmonary edema 01/31/2019  . Moderate mitral regurgitation   . Acute respiratory failure with hypoxia ([place]) 03/29/2017  . Trigger finger of right hand 09/11/2016  . Hyperlipidemia 03/31/2016  . Anxiety state 03/11/2016  . Lumbar radiculopathy 01/28/2016  . Seasonal allergies   . Healthcare maintenance 05/03/2015  . Anemia in chronic kidney disease 08/30/2014  . ESRD on dialysis ([address]) 12/27/2013  . History of syphilis 11/07/2013  . Arthritis 09/09/2013  . Tobacco abuse 09/07/2013  . Chronic pain disorder 04/28/2013  . Gout 08/13/2009  . HIV disease ([place]) 05/08/2006  . Essential hypertension 05/08/2006  . SEIZURE DISORDER 05/08/2006    Past Surgical History:  Procedure Laterality Date  . AV FISTULA PLACEMENT Right 12/02/2013   Procedure: ARTERIOVENOUS (AV) FISTULA CREATION;  Surgeon: Rosetta Posner, MD;  Location: [facility];  Service: Vascular;  Laterality: Right;  . INCISION AND DRAINAGE ABSCESS Right 03/09/2017   Procedure: INCISION AND DRAINAGE Right Scrotal Abscess;  Surgeon: Franchot Gallo, MD;  Location: [facility];  Service: Urology;  Laterality: Right;  . THROMBECTOMY Right ~ 2016   "AV fistula clotted off"        Home Medications  Prior to Admission medications   Medication Sig Start Date End Date Taking? Authorizing Provider  albuterol (PROVENTIL HFA;VENTOLIN HFA) 108 (90 Base) MCG/ACT inhaler Inhale 2 puffs into the lungs every 4 (four) hours as needed for up to 30 days for wheezing or shortness of breath. 09/21/18 02/14/19  Kerin Perna, NP  amitriptyline (ELAVIL) 25 MG tablet TAKE 1 TABLET BY MOUTH EVERYDAY AT BEDTIME Patient taking differently: Take 25 mg by mouth at bedtime.  02/10/19   Carlyle Basques, MD  amLODipine (NORVASC) 10 MG tablet Take 1 tablet (10 mg total) by mouth daily. 09/21/18   Kerin Perna, NP  azelastine (OPTIVAR) 0.05 % ophthalmic solution Place 1 drop into both eyes daily.  12/24/18   [provider]  azithromycin (ZITHROMAX) 250 MG tablet Take 1 tablet (250 mg total) by mouth daily. Take first 2 tablets together, then 1 every day until finished. 02/20/19   Sherwood Gambler, MD  budesonide-formoterol [facility]) 160-4.5 MCG/ACT inhaler Inhale 2 puffs into the lungs 2 (two) times daily. 09/21/18   Kerin Perna, NP  buPROPion (WELLBUTRIN SR) 100 MG 12 hr tablet TAKE 1 TABLET BY MOUTH TWICE A DAY Patient taking differently: Take 100 mg by mouth 2 (two) times daily.  02/10/19   Carlyle Basques, MD  carvedilol (COREG) 25 MG tablet Take 1 tablet (25 mg total) by mouth 2 (two) times daily with a meal. 09/21/18   Kerin Perna, NP  Dolutegravir-Rilpivirine (JULUCA) 50-25 MG TABS Take 1 tablet by mouth daily with supper. 12/29/18   Kuppelweiser, Cassie L, RPH-CPP  doxazosin (CARDURA) 8 MG tablet Take 1 tablet (8 mg total) by mouth at bedtime. 09/21/18   Kerin Perna, NP  ferric citrate (AURYXIA) 1 GM 210 MG(Fe) tablet Take 420 mg by mouth 3 (three) times daily with meals.    [provider]  fluticasone (FLONASE) 50 MCG/ACT nasal spray Place 1 spray into both nostrils as needed for allergies.  09/23/18   [provider]  furosemide (LASIX) 40 MG tablet TAKE 1 TABLET (40 MG TOTAL) BY MOUTH DAILY AS NEEDED FOR FLUID. 02/09/19   Kerin Perna, NP  hydrALAZINE (APRESOLINE) 100 MG tablet Take 100 mg by mouth 3 (three) times daily.  12/01/18   [provider]  ipratropium-albuterol (DUONEB) 0.5-2.5 (3) MG/3ML SOLN Take 3 mLs by nebulization every 6 (six) hours as needed. Patient not taking: Reported on 02/08/2019 09/21/18   Kerin Perna, NP  lisinopril (ZESTRIL) 10 MG tablet Take 10 mg by mouth daily. 09/06/18   [provider]  montelukast (SINGULAIR) 10 MG tablet Take 10 mg by mouth at bedtime.  11/16/18   [provider]   NICOTROL 10 MG inhaler Inhale 1 continuous puffing into the lungs as needed for smoking cessation.  01/18/19   [provider]  pravastatin (PRAVACHOL) 40 MG tablet TAKE 1 TABLET BY MOUTH EVERYDAY AT BEDTIME Patient taking differently: Take 40 mg by mouth daily.  02/09/19   Kerin Perna, NP  sucroferric oxyhydroxide (VELPHORO) 500 MG chewable tablet Chew 500 mg by mouth 3 (three) times daily with meals.    [provider]    Family History Family History  Problem Relation Age of Onset  . Cancer Mother   . Hypertension Mother   . COPD Father   . Hypertension Father   . Diabetes Sister   . Hypertension Sister   . Diabetes Brother   . Hypertension Brother   . Stroke Neg Hx  Social History Social History   Tobacco Use  . Smoking status: Current Some Day Smoker    Packs/day: 0.10    Years: 25.00    Pack years: 2.50    Types: Cigarettes    Last attempt to quit: 10/19/2017    Years since quitting: 1.3  . Smokeless tobacco: Never Used  . Tobacco comment: "quitting"  Substance Use Topics  . Alcohol use: No    Alcohol/week: 0.0 standard drinks    Comment: 04/13/2017 "quit ~ 2014"  . Drug use: No     Allergies   Patient has no known allergies.   Review of Systems Review of Systems  Constitutional: Negative for fever.  Respiratory: Positive for cough and shortness of breath.   Cardiovascular: Positive for chest pain. Negative for leg swelling.  All other systems reviewed and are negative.    Physical Exam Updated Vital Signs BP (!) 171/84 (BP Location: Left Arm) Comment: Simultaneous filing. User may not have seen previous data.  Pulse 83   Temp 98.6 F (37 C) (Oral)   Resp 18   Ht 5\' 9"  (1.753 m)   Wt 108 kg   SpO2 97%   BMI 35.16 kg/m   Physical Exam Vitals signs and nursing note reviewed.  Constitutional:      Appearance: He is well-developed.  HENT:     Head: Normocephalic and atraumatic.     Right Ear: External ear normal.      Left Ear: External ear normal.     Nose: Nose normal.  Eyes:     General:        Right eye: No discharge.        Left eye: No discharge.  Cardiovascular:     Rate and Rhythm: Normal rate and regular rhythm.     Heart sounds: Normal heart sounds.  Pulmonary:     Effort: Pulmonary effort is normal. Tachypnea (mild) present. No accessory muscle usage.     Breath sounds: Wheezing present.  Abdominal:     Palpations: Abdomen is soft.     Tenderness: There is no abdominal tenderness.  Skin:    General: Skin is warm and dry.  Neurological:     Mental Status: He is alert.  Psychiatric:        Mood and Affect: Mood is not anxious.      ED Treatments / Results  Labs (all labs ordered are listed, but only abnormal results are displayed) Labs Reviewed  BASIC METABOLIC PANEL - Abnormal; Notable for the following components:      Result Value   Chloride 97 (*)    Glucose, Bld 112 (*)    BUN 83 (*)    Creatinine, Ser 12.54 (*)    GFR calc non Af Amer 4 (*)    GFR calc Af Amer 5 (*)    Anion gap 19 (*)    All other components within normal limits  CBC - Abnormal; Notable for the following components:   RBC 2.72 (*)    Hemoglobin 10.2 (*)    HCT 29.4 (*)    MCV 108.1 (*)    MCH 37.5 (*)    RDW 16.2 (*)    All other components within normal limits  TROPONIN I (HIGH SENSITIVITY) - Abnormal; Notable for the following components:   Troponin I (High Sensitivity) 25 (*)    All other components within normal limits    EKG EKG Interpretation  Date/Time:  Sunday February 20 2019 13:52:09 EDT Ventricular Rate:  83 PR Interval:  136 QRS Duration: 84 QT Interval:  378 QTC Calculation: 444 R Axis:   85 Text Interpretation:  Normal sinus rhythm no acute ST/T changes no significant change since February 14 2019 Confirmed by Sherwood Gambler [phone]) on 02/20/2019 5:01:39 PM   Radiology Dg Chest 2 View  Result Date: 02/20/2019 CLINICAL DATA:  Shortness of breath and cough EXAM: CHEST - 2 VIEW  COMPARISON:  February 14, 2019 FINDINGS: There has been interval clearing of consolidation from portions of the left mid lower lung zones. There has also been partial clearing of infiltrate from the right base. There remains patchy airspace opacity throughout the right mid lung with an area of atelectasis in the right base. There is subtle increased opacity in the left mid lung. There is a persistent small left pleural effusion. Heart is mildly enlarged with mild pulmonary venous hypertension. No adenopathy. No bone lesions. IMPRESSION: Multifocal pneumonia with partial but incomplete interval clearing, particular on the left, compared to recent prior study. No new areas of opacity evident compared to prior study. Mild cardiomegaly with a degree of pulmonary vascular congestion. No adenopathy evident. Persistent small left pleural effusion. Electronically Signed   By: Lowella Grip III M.D.   On: 02/20/2019 17:58    Procedures Procedures (including critical care time)  Medications Ordered in ED Medications  sodium chloride flush (NS) 0.9 % injection 3 mL (3 mLs Intravenous Not Given 02/20/19 1811)  amLODipine (NORVASC) tablet 10 mg (10 mg Oral Given 02/20/19 1802)  carvedilol (COREG) tablet 25 mg (25 mg Oral Given 02/20/19 1811)  hydrALAZINE (APRESOLINE) tablet 100 mg (100 mg Oral Given 02/20/19 1801)  lisinopril (ZESTRIL) tablet 10 mg (10 mg Oral Given 02/20/19 1802)  albuterol (VENTOLIN HFA) 108 (90 Base) MCG/ACT inhaler 6 puff (6 puffs Inhalation Given 02/20/19 1812)  acetaminophen (TYLENOL) tablet 650 mg (650 mg Oral Given 02/20/19 1933)     Initial Impression / Assessment and Plan / ED Course  I have reviewed the triage vital signs and the nursing notes.  Pertinent labs & imaging results that were available during my care of the patient were reviewed by me and considered in my medical decision making (see chart for details).        Patient is feeling better with treatment of his blood pressure with  his home blood pressure medicines as well as albuterol.  His chest x-ray is concerning for possible multifocal pneumonia though this could be resolving edema based on his previous presentation.  He does have a little bit of cough and he is certainly high risk for complications of pneumonia given his HIV status as well as multiple other comorbidities.  I discussed with Dr. Augustin Coupe of nephrology who recommends azithromycin should be okay and he will need to follow-up with dialysis tomorrow.  Otherwise, his labs are near baseline and he is not hypoxic or having increased work of breathing.  Discharged home.  Final Clinical Impressions(s) / ED Diagnoses   Final diagnoses:  COPD exacerbation [facility])    ED Discharge Orders         Ordered    azithromycin (ZITHROMAX) 250 MG tablet  Daily     02/20/19 Joeseph Amor, MD 02/20/19 2043

## 2019-02-21 ENCOUNTER — Other Ambulatory Visit: Payer: Self-pay | Admitting: *Deleted

## 2019-02-21 DIAGNOSIS — Z992 Dependence on renal dialysis: Secondary | ICD-10-CM | POA: Diagnosis not present

## 2019-02-21 DIAGNOSIS — E8770 Fluid overload, unspecified: Secondary | ICD-10-CM | POA: Diagnosis not present

## 2019-02-21 DIAGNOSIS — D509 Iron deficiency anemia, unspecified: Secondary | ICD-10-CM | POA: Diagnosis not present

## 2019-02-21 DIAGNOSIS — T782XXA Anaphylactic shock, unspecified, initial encounter: Secondary | ICD-10-CM | POA: Diagnosis not present

## 2019-02-21 DIAGNOSIS — N186 End stage renal disease: Secondary | ICD-10-CM | POA: Diagnosis not present

## 2019-02-21 DIAGNOSIS — D631 Anemia in chronic kidney disease: Secondary | ICD-10-CM | POA: Diagnosis not present

## 2019-02-21 DIAGNOSIS — N2581 Secondary hyperparathyroidism of renal origin: Secondary | ICD-10-CM | POA: Diagnosis not present

## 2019-02-21 NOTE — Patient Outreach (Signed)
[place] [facility]) Care Management  02/21/2019  Darrell Johnson [birth year] [id number]    2nd outreach attempt unsuccessful and unable to leave a voice message.   Will continue outreach attempt over the next week for pending services with [facility].   Raina Mina, RN Care Management Coordinator [place] Office [phone]

## 2019-02-22 DIAGNOSIS — N186 End stage renal disease: Secondary | ICD-10-CM | POA: Diagnosis not present

## 2019-02-22 DIAGNOSIS — F17200 Nicotine dependence, unspecified, uncomplicated: Secondary | ICD-10-CM | POA: Diagnosis not present

## 2019-02-22 DIAGNOSIS — I120 Hypertensive chronic kidney disease with stage 5 chronic kidney disease or end stage renal disease: Secondary | ICD-10-CM | POA: Diagnosis not present

## 2019-02-22 DIAGNOSIS — Z992 Dependence on renal dialysis: Secondary | ICD-10-CM | POA: Diagnosis not present

## 2019-02-22 DIAGNOSIS — J449 Chronic obstructive pulmonary disease, unspecified: Secondary | ICD-10-CM | POA: Diagnosis not present

## 2019-02-22 DIAGNOSIS — B20 Human immunodeficiency virus [HIV] disease: Secondary | ICD-10-CM | POA: Diagnosis not present

## 2019-02-23 ENCOUNTER — Other Ambulatory Visit: Payer: Self-pay

## 2019-02-23 ENCOUNTER — Encounter (INDEPENDENT_AMBULATORY_CARE_PROVIDER_SITE_OTHER): Payer: Self-pay | Admitting: Primary Care

## 2019-02-23 ENCOUNTER — Ambulatory Visit (INDEPENDENT_AMBULATORY_CARE_PROVIDER_SITE_OTHER): Payer: Medicare Other | Admitting: Primary Care

## 2019-02-23 VITALS — BP 163/74 | HR 96 | Temp 97.7°F | Ht 69.0 in | Wt 242.6 lb

## 2019-02-23 DIAGNOSIS — T782XXA Anaphylactic shock, unspecified, initial encounter: Secondary | ICD-10-CM | POA: Diagnosis not present

## 2019-02-23 DIAGNOSIS — I10 Essential (primary) hypertension: Secondary | ICD-10-CM

## 2019-02-23 DIAGNOSIS — D631 Anemia in chronic kidney disease: Secondary | ICD-10-CM | POA: Diagnosis not present

## 2019-02-23 DIAGNOSIS — N2581 Secondary hyperparathyroidism of renal origin: Secondary | ICD-10-CM | POA: Diagnosis not present

## 2019-02-23 DIAGNOSIS — Z09 Encounter for follow-up examination after completed treatment for conditions other than malignant neoplasm: Secondary | ICD-10-CM | POA: Diagnosis not present

## 2019-02-23 DIAGNOSIS — D509 Iron deficiency anemia, unspecified: Secondary | ICD-10-CM | POA: Diagnosis not present

## 2019-02-23 DIAGNOSIS — Z992 Dependence on renal dialysis: Secondary | ICD-10-CM | POA: Diagnosis not present

## 2019-02-23 DIAGNOSIS — N186 End stage renal disease: Secondary | ICD-10-CM | POA: Diagnosis not present

## 2019-02-23 NOTE — Progress Notes (Signed)
Established Patient Office Visit  Subjective:  Patient ID: Darrell Johnson, adult    DOB: 01-Jan-1966  Age: 53 y.o. MRN: [id number]  CC:  Chief Complaint  Patient presents with  . Hospitalization Follow-up    acute respiratory failure     HPI Darrell Johnson presents for hospital follow up he has just finished with his HD an is drained and in pain. Very disturbed about new diagnosis COPD but wants to reschedule to discuss this on a day that he does not have HD.  Past Medical History:  Diagnosis Date  . Anemia   . Anxiety   . Arthritis    "knees" (04/13/2017)  . Asthma   . CHF (congestive heart failure) ([place])   . ESRD (end stage renal disease) on dialysis [facility])    "MWF; [facility]" (04/13/2017)  . Gout, unspecified 08/13/2009   Qualifier: Diagnosis of  By: Redmond Pulling  MD, Mateo Flow    . H1N1 influenza    March 2016  . History of blood transfusion 02/2017   "related to OR"  . HIV infection ([place]) 1980's   on ART therapy since, followed by ID clinic, complicated  by neuropathy  . Hyperlipidemia    hypertrygliceridemia determined ti be secondary to ART therpay  . HYPERTENSION 05/08/2006  . Pneumonia    "once; years ago" (04/13/2017)  . Rib fractures 01/2009  . Seizures ([place])    last seizure was in the 1990s, pt has family history of seizures; "probably related to alcohol" (04/13/2017)  . 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 Syphilis 1997   history of syphilis 1997  . Tobacco abuse     Past Surgical History:  Procedure Laterality Date  . AV FISTULA PLACEMENT Right 12/02/2013   Procedure: ARTERIOVENOUS (AV) FISTULA CREATION;  Surgeon: Rosetta Posner, MD;  Location: [place];  Service: Vascular;  Laterality: Right;  . INCISION AND DRAINAGE ABSCESS Right 03/09/2017   Procedure: INCISION AND DRAINAGE Right Scrotal Abscess;  Surgeon: Franchot Gallo, MD;  Location: [place];  Service: Urology;  Laterality: Right;  . THROMBECTOMY Right ~ 2016   "AV fistula clotted off"    Family History  Problem Relation Age of Onset  . Cancer Mother   . Hypertension Mother   . COPD Father   . Hypertension Father   . Diabetes Sister   . Hypertension Sister   . Diabetes Brother   . Hypertension Brother   . Stroke Neg Hx     Social History   Socioeconomic History  . Marital status: Married    Spouse name: Not on file  . Number of children: Not on file  . Years of education: Not on file  . Highest education level: Not on file  Occupational History  . Occupation: details cars  Social Needs  . Financial resource strain: Not on file  . Food insecurity    Worry: Not on file    Inability: Not on file  . Transportation needs    Medical: Not on file    Non-medical: Not on file  Tobacco Use  . Smoking status: Former Smoker    Packs/day: 0.10    Years: 25.00    Pack years: 2.50    Types: Cigarettes    Quit date: 12/20/2018    Years since quitting: 0.2  . Smokeless tobacco: Never Used  .  Tobacco comment: "quitting"  Substance and Sexual Activity  . Alcohol use: No    Alcohol/week: 0.0 standard drinks    Comment: 04/13/2017 "quit ~ 2014"  . Drug use: No  . Sexual activity: Never    Partners: Female    Comment: given condoms  Lifestyle  . Physical activity    Days per week: Not on file    Minutes per session: Not on file  . Stress: Not on file  Relationships  . Social Herbalist on phone: Not on file    Gets together: Not on file    Attends religious service: Not on file    Active member of club or organization: Not on file    Attends meetings of clubs or organizations: Not on file    Relationship status: Not on file  . Intimate partner violence    Fear of current or ex partner: Not on file    Emotionally abused: Not on file    Physically abused: Not on file    Forced sexual activity: Not on file  Other Topics  Concern  . Not on file  Social History Narrative   ** Merged History Encounter **        Outpatient Medications Prior to Visit  Medication Sig Dispense Refill  . amLODipine (NORVASC) 10 MG tablet Take 1 tablet (10 mg total) by mouth daily. 90 tablet 3  . azelastine (OPTIVAR) 0.05 % ophthalmic solution Place 1 drop into both eyes daily.     Marland Kitchen azithromycin (ZITHROMAX) 250 MG tablet Take 1 tablet (250 mg total) by mouth daily. Take first 2 tablets together, then 1 every day until finished. 6 tablet 0  . budesonide-formoterol (SYMBICORT) 160-4.5 MCG/ACT inhaler Inhale 2 puffs into the lungs 2 (two) times daily. 1 Inhaler 5  . carvedilol (COREG) 25 MG tablet Take 1 tablet (25 mg total) by mouth 2 (two) times daily with a meal. 180 tablet 3  . Dolutegravir-Rilpivirine (JULUCA) 50-25 MG TABS Take 1 tablet by mouth daily with supper. 30 tablet 1  . doxazosin (CARDURA) 8 MG tablet Take 1 tablet (8 mg total) by mouth at bedtime. 90 tablet 3  . ferric citrate (AURYXIA) 1 GM 210 MG(Fe) tablet Take 420 mg by mouth 3 (three) times daily with meals.    . fluticasone (FLONASE) 50 MCG/ACT nasal spray Place 1 spray into both nostrils as needed for allergies.     . furosemide (LASIX) 40 MG tablet TAKE 1 TABLET (40 MG TOTAL) BY MOUTH DAILY AS NEEDED FOR FLUID. 90 tablet 0  . hydrALAZINE (APRESOLINE) 100 MG tablet Take 100 mg by mouth 3 (three) times daily.     Marland Kitchen ipratropium-albuterol (DUONEB) 0.5-2.5 (3) MG/3ML SOLN Take 3 mLs by nebulization every 6 (six) hours as needed. 360 mL 1  . lisinopril (ZESTRIL) 10 MG tablet Take 10 mg by mouth daily.    . montelukast (SINGULAIR) 10 MG tablet Take 10 mg by mouth at bedtime.     Marland Kitchen NICOTROL 10 MG inhaler Inhale 1 continuous puffing into the lungs as needed for smoking cessation.     . pravastatin (PRAVACHOL) 40 MG tablet TAKE 1 TABLET BY MOUTH EVERYDAY AT BEDTIME (Patient taking differently: Take 40 mg by mouth daily. ) 90 tablet 0  . sucroferric oxyhydroxide (VELPHORO)  500 MG chewable tablet Chew 500 mg by mouth 3 (three) times daily with meals.    Marland Kitchen albuterol (PROVENTIL HFA;VENTOLIN HFA) 108 (90 Base) MCG/ACT inhaler Inhale 2 puffs into  the lungs every 4 (four) hours as needed for up to 30 days for wheezing or shortness of breath. 1 Inhaler 2  . amitriptyline (ELAVIL) 25 MG tablet TAKE 1 TABLET BY MOUTH EVERYDAY AT BEDTIME (Patient not taking: No sig reported) 90 tablet 2  . buPROPion (WELLBUTRIN SR) 100 MG 12 hr tablet TAKE 1 TABLET BY MOUTH TWICE A DAY (Patient not taking: No sig reported) 180 tablet 3  . predniSONE (DELTASONE) 20 MG tablet TAKE 2 TABS X 3DAYS, THEN 1 AND 1/2 TABS X 3 DAYS, THEN 1 TAB X 3 DAYS,THEN 1/2TABX 3 DAYS,THEN STOP     No facility-administered medications prior to visit.     No Known Allergies  ROS Review of Systems  Constitutional: Positive for activity change, appetite change and fatigue.  Respiratory: Positive for shortness of breath.        Excertion  Neurological: Positive for weakness.      Objective:    Physical Exam  Constitutional: He is oriented to person, place, and time. He appears well-developed and well-nourished.  Neck: Normal range of motion. Neck supple.  Cardiovascular: Normal rate and regular rhythm.  Pulmonary/Chest: Effort normal and breath sounds normal.  Abdominal: Soft. Bowel sounds are normal. He exhibits distension.  Musculoskeletal: Normal range of motion.  Neurological: He is oriented to person, place, and time.  Skin:  Bruit feel/thrill  Psychiatric: He has a normal mood and affect.    BP (!) 163/74 (BP Location: Left Arm, Patient Position: Sitting, Cuff Size: Normal)   Pulse 96   Temp 97.7 F (36.5 C) (Tympanic)   Ht 5\' 9"  (1.753 m)   Wt 242 lb 9.6 oz (110 kg)   SpO2 96%   BMI 35.83 kg/m  Wt Readings from Last 3 Encounters:  02/23/19 242 lb 9.6 oz (110 kg)  02/20/19 238 lb 1.6 oz (108 kg)  02/14/19 242 lb 8.1 oz (110 kg)     Health Maintenance Due  Topic Date Due  .  INFLUENZA VACCINE  02/19/2019    There are no preventive care reminders to display for this patient.  Lab Results  Component Value Date   TSH 1.214 07/06/2013   Lab Results  Component Value Date   WBC 9.0 02/20/2019   HGB 10.2 (L) 02/20/2019   HCT 29.4 (L) 02/20/2019   MCV 108.1 (H) 02/20/2019   PLT 203 02/20/2019   Lab Results  Component Value Date   NA 138 02/20/2019   K 4.6 02/20/2019   CO2 22 02/20/2019   GLUCOSE 112 (H) 02/20/2019   BUN 83 (H) 02/20/2019   CREATININE 12.54 (H) 02/20/2019   BILITOT 0.9 02/14/2019   ALKPHOS 81 02/14/2019   AST 21 02/14/2019   ALT 23 02/14/2019   PROT 6.7 02/14/2019   ALBUMIN 3.6 02/14/2019   CALCIUM 9.3 02/20/2019   ANIONGAP 19 (H) 02/20/2019   Lab Results  Component Value Date   CHOL 127 02/14/2019   Lab Results  Component Value Date   HDL 32 (L) 02/14/2019   Lab Results  Component Value Date   LDLCALC 58 02/14/2019   Lab Results  Component Value Date   TRIG 185 (H) 02/14/2019   Lab Results  Component Value Date   CHOLHDL 4.0 02/14/2019   Lab Results  Component Value Date   HGBA1C 5.1 02/14/2019      Assessment & Plan:   Problem List Items Addressed This Visit    Essential hypertension    Other Visit Diagnoses  Hospital discharge follow-up    -  Primary     Tyshan was seen today for hospitalization follow-up.  Diagnoses and all orders for this visit:  Hospital discharge follow-up Patient presented to the emergency with shortness of breath and dizziness.  Chest x-ray was concerning with possible multifocal pneumonia present.  Discharged on antibiotics and to follow-up with PCP  Essential hypertension Counseled on blood pressure goal of less than 130/80, low-sodium, DASH diet, medication compliance, 150 minutes of moderate intensity exercise per week. Discussed medication compliance, adverse effects.   No orders of the defined types were placed in this encounter.   Follow-up: No follow-ups on  file.    Kerin Perna, NP

## 2019-02-25 ENCOUNTER — Other Ambulatory Visit: Payer: Self-pay | Admitting: *Deleted

## 2019-02-25 DIAGNOSIS — N2581 Secondary hyperparathyroidism of renal origin: Secondary | ICD-10-CM | POA: Diagnosis not present

## 2019-02-25 DIAGNOSIS — N186 End stage renal disease: Secondary | ICD-10-CM | POA: Diagnosis not present

## 2019-02-25 DIAGNOSIS — T782XXA Anaphylactic shock, unspecified, initial encounter: Secondary | ICD-10-CM | POA: Diagnosis not present

## 2019-02-25 DIAGNOSIS — Z992 Dependence on renal dialysis: Secondary | ICD-10-CM | POA: Diagnosis not present

## 2019-02-25 DIAGNOSIS — D631 Anemia in chronic kidney disease: Secondary | ICD-10-CM | POA: Diagnosis not present

## 2019-02-25 DIAGNOSIS — D509 Iron deficiency anemia, unspecified: Secondary | ICD-10-CM | POA: Diagnosis not present

## 2019-02-25 NOTE — Patient Outreach (Signed)
[place] [facility]) Care Management  02/25/2019  LOYDE ORTH 1966-04-30 [id number]   Unsuccessful outreach attempt #3.  Covering for assigned care manager, call placed to member to follow up on recent discharge and assess for needs.  No answer, unable to leave voice message.  Will provide update to assigned care manager.  Valente David, [place], MSN [place] [phone]

## 2019-02-28 DIAGNOSIS — N186 End stage renal disease: Secondary | ICD-10-CM | POA: Diagnosis not present

## 2019-02-28 DIAGNOSIS — D509 Iron deficiency anemia, unspecified: Secondary | ICD-10-CM | POA: Diagnosis not present

## 2019-02-28 DIAGNOSIS — D631 Anemia in chronic kidney disease: Secondary | ICD-10-CM | POA: Diagnosis not present

## 2019-02-28 DIAGNOSIS — T782XXA Anaphylactic shock, unspecified, initial encounter: Secondary | ICD-10-CM | POA: Diagnosis not present

## 2019-02-28 DIAGNOSIS — N2581 Secondary hyperparathyroidism of renal origin: Secondary | ICD-10-CM | POA: Diagnosis not present

## 2019-02-28 DIAGNOSIS — Z992 Dependence on renal dialysis: Secondary | ICD-10-CM | POA: Diagnosis not present

## 2019-03-01 ENCOUNTER — Telehealth: Payer: Self-pay | Admitting: *Deleted

## 2019-03-01 DIAGNOSIS — T782XXA Anaphylactic shock, unspecified, initial encounter: Secondary | ICD-10-CM | POA: Diagnosis not present

## 2019-03-01 DIAGNOSIS — N186 End stage renal disease: Secondary | ICD-10-CM | POA: Diagnosis not present

## 2019-03-01 DIAGNOSIS — Z992 Dependence on renal dialysis: Secondary | ICD-10-CM | POA: Diagnosis not present

## 2019-03-01 DIAGNOSIS — D631 Anemia in chronic kidney disease: Secondary | ICD-10-CM | POA: Diagnosis not present

## 2019-03-01 DIAGNOSIS — N2581 Secondary hyperparathyroidism of renal origin: Secondary | ICD-10-CM | POA: Diagnosis not present

## 2019-03-01 DIAGNOSIS — D509 Iron deficiency anemia, unspecified: Secondary | ICD-10-CM | POA: Diagnosis not present

## 2019-03-01 NOTE — Telephone Encounter (Signed)
TOC CM spoke to pt and states his pharmacy cannot fill the Zithromax. Contacted [facility] and spoke to pharmacist. They needed an ICD 10 due to restriction for this drug due to pandemic. Provide ICD 10 code. Call to pt to make aware. States he has followed up with PCP since hospitalization. Jonnie Finner RN CCM Case Mgmt phone [phone]

## 2019-03-02 DIAGNOSIS — N2581 Secondary hyperparathyroidism of renal origin: Secondary | ICD-10-CM | POA: Diagnosis not present

## 2019-03-02 DIAGNOSIS — D509 Iron deficiency anemia, unspecified: Secondary | ICD-10-CM | POA: Diagnosis not present

## 2019-03-02 DIAGNOSIS — T782XXA Anaphylactic shock, unspecified, initial encounter: Secondary | ICD-10-CM | POA: Diagnosis not present

## 2019-03-02 DIAGNOSIS — N186 End stage renal disease: Secondary | ICD-10-CM | POA: Diagnosis not present

## 2019-03-02 DIAGNOSIS — Z992 Dependence on renal dialysis: Secondary | ICD-10-CM | POA: Diagnosis not present

## 2019-03-02 DIAGNOSIS — D631 Anemia in chronic kidney disease: Secondary | ICD-10-CM | POA: Diagnosis not present

## 2019-03-04 ENCOUNTER — Other Ambulatory Visit (HOSPITAL_COMMUNITY)
Admission: RE | Admit: 2019-03-04 | Discharge: 2019-03-04 | Disposition: A | Discharge status: Home / Self Care | Payer: Medicare Other | Source: Ambulatory Visit | Attending: Internal Medicine | Admitting: Internal Medicine

## 2019-03-04 DIAGNOSIS — Z992 Dependence on renal dialysis: Secondary | ICD-10-CM | POA: Diagnosis not present

## 2019-03-04 DIAGNOSIS — T782XXA Anaphylactic shock, unspecified, initial encounter: Secondary | ICD-10-CM | POA: Diagnosis not present

## 2019-03-04 DIAGNOSIS — Z20828 Contact with and (suspected) exposure to other viral communicable diseases: Secondary | ICD-10-CM | POA: Diagnosis not present

## 2019-03-04 DIAGNOSIS — N186 End stage renal disease: Secondary | ICD-10-CM | POA: Diagnosis not present

## 2019-03-04 DIAGNOSIS — N2581 Secondary hyperparathyroidism of renal origin: Secondary | ICD-10-CM | POA: Diagnosis not present

## 2019-03-04 DIAGNOSIS — D631 Anemia in chronic kidney disease: Secondary | ICD-10-CM | POA: Diagnosis not present

## 2019-03-04 DIAGNOSIS — Z01812 Encounter for preprocedural laboratory examination: Secondary | ICD-10-CM | POA: Diagnosis not present

## 2019-03-04 DIAGNOSIS — D509 Iron deficiency anemia, unspecified: Secondary | ICD-10-CM | POA: Diagnosis not present

## 2019-03-04 LAB — SARS CORONAVIRUS 2 (TAT 6-24 HRS): SARS Coronavirus 2: NEGATIVE

## 2019-03-06 ENCOUNTER — Other Ambulatory Visit: Payer: Self-pay

## 2019-03-06 ENCOUNTER — Ambulatory Visit (HOSPITAL_BASED_OUTPATIENT_CLINIC_OR_DEPARTMENT_OTHER): Payer: Medicare Other | Attending: Primary Care | Admitting: Internal Medicine

## 2019-03-06 VITALS — Ht 69.0 in | Wt 242.0 lb

## 2019-03-06 DIAGNOSIS — G4733 Obstructive sleep apnea (adult) (pediatric): Secondary | ICD-10-CM | POA: Diagnosis not present

## 2019-03-06 DIAGNOSIS — R0902 Hypoxemia: Secondary | ICD-10-CM | POA: Insufficient documentation

## 2019-03-06 DIAGNOSIS — R0602 Shortness of breath: Secondary | ICD-10-CM

## 2019-03-06 DIAGNOSIS — R0683 Snoring: Secondary | ICD-10-CM

## 2019-03-07 DIAGNOSIS — N2581 Secondary hyperparathyroidism of renal origin: Secondary | ICD-10-CM | POA: Diagnosis not present

## 2019-03-07 DIAGNOSIS — T782XXA Anaphylactic shock, unspecified, initial encounter: Secondary | ICD-10-CM | POA: Diagnosis not present

## 2019-03-07 DIAGNOSIS — D631 Anemia in chronic kidney disease: Secondary | ICD-10-CM | POA: Diagnosis not present

## 2019-03-07 DIAGNOSIS — D509 Iron deficiency anemia, unspecified: Secondary | ICD-10-CM | POA: Diagnosis not present

## 2019-03-07 DIAGNOSIS — N186 End stage renal disease: Secondary | ICD-10-CM | POA: Diagnosis not present

## 2019-03-07 DIAGNOSIS — Z992 Dependence on renal dialysis: Secondary | ICD-10-CM | POA: Diagnosis not present

## 2019-03-08 ENCOUNTER — Ambulatory Visit (INDEPENDENT_AMBULATORY_CARE_PROVIDER_SITE_OTHER): Payer: Medicare Other | Admitting: Primary Care

## 2019-03-09 ENCOUNTER — Other Ambulatory Visit: Payer: Self-pay | Admitting: Pharmacist

## 2019-03-09 DIAGNOSIS — N2581 Secondary hyperparathyroidism of renal origin: Secondary | ICD-10-CM | POA: Diagnosis not present

## 2019-03-09 DIAGNOSIS — B20 Human immunodeficiency virus [HIV] disease: Secondary | ICD-10-CM

## 2019-03-09 DIAGNOSIS — D509 Iron deficiency anemia, unspecified: Secondary | ICD-10-CM | POA: Diagnosis not present

## 2019-03-09 DIAGNOSIS — N186 End stage renal disease: Secondary | ICD-10-CM | POA: Diagnosis not present

## 2019-03-09 DIAGNOSIS — T782XXA Anaphylactic shock, unspecified, initial encounter: Secondary | ICD-10-CM | POA: Diagnosis not present

## 2019-03-09 DIAGNOSIS — Z992 Dependence on renal dialysis: Secondary | ICD-10-CM | POA: Diagnosis not present

## 2019-03-09 DIAGNOSIS — D631 Anemia in chronic kidney disease: Secondary | ICD-10-CM | POA: Diagnosis not present

## 2019-03-10 ENCOUNTER — Telehealth: Payer: Self-pay | Admitting: *Deleted

## 2019-03-10 NOTE — Telephone Encounter (Signed)
Called the patient to ask if his transplant provider wanted him to stop Zidovudine and if so why. Had to leave a message as he did not answer the line. Tried to call [name] and was placed on hold and transferred several times.

## 2019-03-11 DIAGNOSIS — D631 Anemia in chronic kidney disease: Secondary | ICD-10-CM | POA: Diagnosis not present

## 2019-03-11 DIAGNOSIS — N186 End stage renal disease: Secondary | ICD-10-CM | POA: Diagnosis not present

## 2019-03-11 DIAGNOSIS — D509 Iron deficiency anemia, unspecified: Secondary | ICD-10-CM | POA: Diagnosis not present

## 2019-03-11 DIAGNOSIS — Z992 Dependence on renal dialysis: Secondary | ICD-10-CM | POA: Diagnosis not present

## 2019-03-11 DIAGNOSIS — N2581 Secondary hyperparathyroidism of renal origin: Secondary | ICD-10-CM | POA: Diagnosis not present

## 2019-03-11 DIAGNOSIS — T782XXA Anaphylactic shock, unspecified, initial encounter: Secondary | ICD-10-CM | POA: Diagnosis not present

## 2019-03-12 DIAGNOSIS — R0683 Snoring: Secondary | ICD-10-CM

## 2019-03-12 DIAGNOSIS — R0602 Shortness of breath: Secondary | ICD-10-CM | POA: Diagnosis not present

## 2019-03-12 NOTE — Procedures (Signed)
    Patient Name: Darrell Johnson, Darrell Johnson Date: 03/06/2019 Gender: Male D.O.B: 31-Oct-1965 Age (years): 35 Referring Provider: Kerin Perna NP Height (inches): 69 Interpreting Physician: Baird Lyons MD, ABSM Weight (lbs): 242 RPSGT: Gwenyth Allegra BMI: 36 MRN: [id number] Neck Size: 19.00  CLINICAL INFORMATION Sleep Study Type: NPSG Indication for sleep study: Snoring Epworth Sleepiness Score: 10  SLEEP STUDY TECHNIQUE As per the AASM Manual for the Scoring of Sleep and Associated Events v2.3 (April 2016) with a hypopnea requiring 4% desaturations.  The channels recorded and monitored were frontal, central and occipital EEG, electrooculogram (EOG), submentalis EMG (chin), nasal and oral airflow, thoracic and abdominal wall motion, anterior tibialis EMG, snore microphone, electrocardiogram, and pulse oximetry.  MEDICATIONS Medications self-administered by patient taken the night of the study : none reported  SLEEP ARCHITECTURE The study was initiated at 10:00:36 PM and ended at 4:04:02 AM.  Sleep onset time was 18.4 minutes and the sleep efficiency was 53.2%%. The total sleep time was 193.5 minutes.  Stage REM latency was 123.0 minutes.  The patient spent 17.8%% of the night in stage N1 sleep, 58.1%% in stage N2 sleep, 0.0%% in stage N3 and 24% in REM.  Alpha intrusion was absent.  Supine sleep was 0.00%.  RESPIRATORY PARAMETERS The overall apnea/hypopnea index (AHI) was 96.4 per hour. There were 124 total apneas, including 123 obstructive, 1 central and 0 mixed apneas. There were 187 hypopneas and 9 RERAs.  The AHI during Stage REM sleep was 101.9 per hour.  AHI while supine was N/A per hour.  The mean oxygen saturation was 89.1%. The minimum SpO2 during sleep was 68.0%.  moderate snoring was noted during this study.  CARDIAC DATA The 2 lead EKG demonstrated sinus rhythm. The mean heart rate was 81.5 beats per minute. Other EKG findings include: PVCs.  LEG  MOVEMENT DATA The total PLMS were 0 with a resulting PLMS index of 0.0. Associated arousal with leg movement index was 0.0 .  IMPRESSIONS - Severe obstructive sleep apnea occurred during this study (AHI = 96.4/h). - No significant central sleep apnea occurred during this study (CAI = 0.3/h). - Severe oxygen desaturation was noted during this study (Min O2 = 68.0%). Mean sat 89.1%. - The patient snored with moderate snoring volume. - EKG findings include PVCs. - Clinically significant periodic limb movements did not occur during sleep. No significant associated arousals.  DIAGNOSIS - Obstructive Sleep Apnea (327.23 [G47.33 ICD-10]) - Nocturnal Hypoxemia (327.26 [G47.36 ICD-10])  RECOMMENDATIONS - Suggest CPAP titration sleep study or autopap 5-20 cwp. - Be careful with alcohol, sedatives and other CNS depressants that may worsen sleep apnea and disrupt normal sleep architecture. - Sleep hygiene should be reviewed to assess factors that may improve sleep quality. - Weight management and regular exercise should be initiated or continued if appropriate.  [Electronically signed] 03/12/2019 11:43 AM  Baird Lyons MD, ABSM Diplomate, American Board of Sleep Medicine   NPI: [id number]                         [place] [place]  ELECTRONICALLY SIGNED ON:  03/12/2019, 11:41 AM [place] PH: [phone]   FX: [phone] [place]

## 2019-03-13 ENCOUNTER — Other Ambulatory Visit (INDEPENDENT_AMBULATORY_CARE_PROVIDER_SITE_OTHER): Payer: Self-pay | Admitting: Primary Care

## 2019-03-13 DIAGNOSIS — G4733 Obstructive sleep apnea (adult) (pediatric): Secondary | ICD-10-CM

## 2019-03-14 ENCOUNTER — Telehealth: Payer: Self-pay | Admitting: *Deleted

## 2019-03-14 DIAGNOSIS — T782XXA Anaphylactic shock, unspecified, initial encounter: Secondary | ICD-10-CM | POA: Diagnosis not present

## 2019-03-14 DIAGNOSIS — D509 Iron deficiency anemia, unspecified: Secondary | ICD-10-CM | POA: Diagnosis not present

## 2019-03-14 DIAGNOSIS — N2581 Secondary hyperparathyroidism of renal origin: Secondary | ICD-10-CM | POA: Diagnosis not present

## 2019-03-14 DIAGNOSIS — Z992 Dependence on renal dialysis: Secondary | ICD-10-CM | POA: Diagnosis not present

## 2019-03-14 DIAGNOSIS — N186 End stage renal disease: Secondary | ICD-10-CM | POA: Diagnosis not present

## 2019-03-14 DIAGNOSIS — D631 Anemia in chronic kidney disease: Secondary | ICD-10-CM | POA: Diagnosis not present

## 2019-03-14 NOTE — Telephone Encounter (Signed)
Patient called for medication refills to be sent to [facility].  Patient takes juluca + zidovudine, was just seen by Dr Baxter Flattery last month.  Per chart, there was confusion about which regimen he should be on per his [name] transplant team.  Patient states he has not seen them in "quite some time" and is supposed to still be on juluca + zidovudine. Unclear if he is supposed to take this once daily or twice daily.   Will send to pharmacy for assistance. Landis Gandy, RN

## 2019-03-15 ENCOUNTER — Other Ambulatory Visit: Payer: Self-pay | Admitting: *Deleted

## 2019-03-15 DIAGNOSIS — Z539 Procedure and treatment not carried out, unspecified reason: Secondary | ICD-10-CM | POA: Diagnosis not present

## 2019-03-15 DIAGNOSIS — B20 Human immunodeficiency virus [HIV] disease: Secondary | ICD-10-CM

## 2019-03-15 DIAGNOSIS — Z992 Dependence on renal dialysis: Secondary | ICD-10-CM | POA: Diagnosis not present

## 2019-03-15 DIAGNOSIS — N2581 Secondary hyperparathyroidism of renal origin: Secondary | ICD-10-CM | POA: Diagnosis not present

## 2019-03-15 DIAGNOSIS — N186 End stage renal disease: Secondary | ICD-10-CM | POA: Diagnosis not present

## 2019-03-15 DIAGNOSIS — E8770 Fluid overload, unspecified: Secondary | ICD-10-CM | POA: Diagnosis not present

## 2019-03-15 MED ORDER — ZIDOVUDINE 300 MG PO TABS: 300.0000 mg | ORAL_TABLET | Freq: Every day | ORAL | 5 refills | Status: DC

## 2019-03-15 MED ORDER — JULUCA 50-25 MG PO TABS: 1.0000 | ORAL_TABLET | Freq: Every day | ORAL | 5 refills | Status: DC

## 2019-03-15 MED FILL — ZIDOVUDINE 300 MG TABLET: 300 | 30 days supply | Qty: 30 | Fill #0

## 2019-03-15 MED FILL — JULUCA 50-25 MG TAB: 50-25 | 30 days supply | Qty: 30 | Fill #0

## 2019-03-15 NOTE — Telephone Encounter (Signed)
Refills sent. THanks!

## 2019-03-15 NOTE — Telephone Encounter (Signed)
Yes, there was some confusion from [name] and I dont think Darrell Johnson could get in touch with anyone there. His zidovudine is supposed to be 300 mg daily. I agree he should continue that with his Juluca.

## 2019-03-16 ENCOUNTER — Other Ambulatory Visit: Payer: Self-pay | Admitting: *Deleted

## 2019-03-16 DIAGNOSIS — D509 Iron deficiency anemia, unspecified: Secondary | ICD-10-CM | POA: Diagnosis not present

## 2019-03-16 DIAGNOSIS — Z992 Dependence on renal dialysis: Secondary | ICD-10-CM | POA: Diagnosis not present

## 2019-03-16 DIAGNOSIS — N186 End stage renal disease: Secondary | ICD-10-CM | POA: Diagnosis not present

## 2019-03-16 DIAGNOSIS — N2581 Secondary hyperparathyroidism of renal origin: Secondary | ICD-10-CM | POA: Diagnosis not present

## 2019-03-16 DIAGNOSIS — D631 Anemia in chronic kidney disease: Secondary | ICD-10-CM | POA: Diagnosis not present

## 2019-03-16 DIAGNOSIS — T782XXA Anaphylactic shock, unspecified, initial encounter: Secondary | ICD-10-CM | POA: Diagnosis not present

## 2019-03-16 NOTE — Patient Outreach (Signed)
[place] [facility]) Care Management  03/16/2019  FIELDS OROS 10-17-1965 [id number]    Case closure due to unsuccessful outreach calls and no response to the outreach letter. Case will be closed at this time and provider notified.  Raina Mina, RN Care Management Coordinator [place] Office [phone]

## 2019-03-18 DIAGNOSIS — N2581 Secondary hyperparathyroidism of renal origin: Secondary | ICD-10-CM | POA: Diagnosis not present

## 2019-03-18 DIAGNOSIS — D631 Anemia in chronic kidney disease: Secondary | ICD-10-CM | POA: Diagnosis not present

## 2019-03-18 DIAGNOSIS — T782XXA Anaphylactic shock, unspecified, initial encounter: Secondary | ICD-10-CM | POA: Diagnosis not present

## 2019-03-18 DIAGNOSIS — Z992 Dependence on renal dialysis: Secondary | ICD-10-CM | POA: Diagnosis not present

## 2019-03-18 DIAGNOSIS — D509 Iron deficiency anemia, unspecified: Secondary | ICD-10-CM | POA: Diagnosis not present

## 2019-03-18 DIAGNOSIS — N186 End stage renal disease: Secondary | ICD-10-CM | POA: Diagnosis not present

## 2019-03-21 DIAGNOSIS — Z992 Dependence on renal dialysis: Secondary | ICD-10-CM | POA: Diagnosis not present

## 2019-03-21 DIAGNOSIS — T782XXA Anaphylactic shock, unspecified, initial encounter: Secondary | ICD-10-CM | POA: Diagnosis not present

## 2019-03-21 DIAGNOSIS — D631 Anemia in chronic kidney disease: Secondary | ICD-10-CM | POA: Diagnosis not present

## 2019-03-21 DIAGNOSIS — D509 Iron deficiency anemia, unspecified: Secondary | ICD-10-CM | POA: Diagnosis not present

## 2019-03-21 DIAGNOSIS — N2581 Secondary hyperparathyroidism of renal origin: Secondary | ICD-10-CM | POA: Diagnosis not present

## 2019-03-21 DIAGNOSIS — N186 End stage renal disease: Secondary | ICD-10-CM | POA: Diagnosis not present

## 2019-03-22 DIAGNOSIS — D631 Anemia in chronic kidney disease: Secondary | ICD-10-CM | POA: Diagnosis not present

## 2019-03-22 DIAGNOSIS — D509 Iron deficiency anemia, unspecified: Secondary | ICD-10-CM | POA: Diagnosis not present

## 2019-03-22 DIAGNOSIS — I120 Hypertensive chronic kidney disease with stage 5 chronic kidney disease or end stage renal disease: Secondary | ICD-10-CM | POA: Diagnosis not present

## 2019-03-22 DIAGNOSIS — Z992 Dependence on renal dialysis: Secondary | ICD-10-CM | POA: Diagnosis not present

## 2019-03-22 DIAGNOSIS — N186 End stage renal disease: Secondary | ICD-10-CM | POA: Diagnosis not present

## 2019-03-22 DIAGNOSIS — E8770 Fluid overload, unspecified: Secondary | ICD-10-CM | POA: Diagnosis not present

## 2019-03-22 DIAGNOSIS — N2581 Secondary hyperparathyroidism of renal origin: Secondary | ICD-10-CM | POA: Diagnosis not present

## 2019-03-23 DIAGNOSIS — Z23 Encounter for immunization: Secondary | ICD-10-CM | POA: Diagnosis not present

## 2019-03-23 DIAGNOSIS — D509 Iron deficiency anemia, unspecified: Secondary | ICD-10-CM | POA: Diagnosis not present

## 2019-03-23 DIAGNOSIS — D631 Anemia in chronic kidney disease: Secondary | ICD-10-CM | POA: Diagnosis not present

## 2019-03-23 DIAGNOSIS — N186 End stage renal disease: Secondary | ICD-10-CM | POA: Diagnosis not present

## 2019-03-23 DIAGNOSIS — N2581 Secondary hyperparathyroidism of renal origin: Secondary | ICD-10-CM | POA: Diagnosis not present

## 2019-03-23 DIAGNOSIS — Z992 Dependence on renal dialysis: Secondary | ICD-10-CM | POA: Diagnosis not present

## 2019-03-25 DIAGNOSIS — N186 End stage renal disease: Secondary | ICD-10-CM | POA: Diagnosis not present

## 2019-03-25 DIAGNOSIS — D631 Anemia in chronic kidney disease: Secondary | ICD-10-CM | POA: Diagnosis not present

## 2019-03-25 DIAGNOSIS — N2581 Secondary hyperparathyroidism of renal origin: Secondary | ICD-10-CM | POA: Diagnosis not present

## 2019-03-25 DIAGNOSIS — D509 Iron deficiency anemia, unspecified: Secondary | ICD-10-CM | POA: Diagnosis not present

## 2019-03-25 DIAGNOSIS — Z992 Dependence on renal dialysis: Secondary | ICD-10-CM | POA: Diagnosis not present

## 2019-03-28 DIAGNOSIS — N2581 Secondary hyperparathyroidism of renal origin: Secondary | ICD-10-CM | POA: Diagnosis not present

## 2019-03-28 DIAGNOSIS — D631 Anemia in chronic kidney disease: Secondary | ICD-10-CM | POA: Diagnosis not present

## 2019-03-28 DIAGNOSIS — N186 End stage renal disease: Secondary | ICD-10-CM | POA: Diagnosis not present

## 2019-03-28 DIAGNOSIS — Z992 Dependence on renal dialysis: Secondary | ICD-10-CM | POA: Diagnosis not present

## 2019-03-28 DIAGNOSIS — D509 Iron deficiency anemia, unspecified: Secondary | ICD-10-CM | POA: Diagnosis not present

## 2019-03-30 DIAGNOSIS — D509 Iron deficiency anemia, unspecified: Secondary | ICD-10-CM | POA: Diagnosis not present

## 2019-03-30 DIAGNOSIS — D631 Anemia in chronic kidney disease: Secondary | ICD-10-CM | POA: Diagnosis not present

## 2019-03-30 DIAGNOSIS — N2581 Secondary hyperparathyroidism of renal origin: Secondary | ICD-10-CM | POA: Diagnosis not present

## 2019-03-30 DIAGNOSIS — Z992 Dependence on renal dialysis: Secondary | ICD-10-CM | POA: Diagnosis not present

## 2019-03-30 DIAGNOSIS — N186 End stage renal disease: Secondary | ICD-10-CM | POA: Diagnosis not present

## 2019-04-01 DIAGNOSIS — D509 Iron deficiency anemia, unspecified: Secondary | ICD-10-CM | POA: Diagnosis not present

## 2019-04-01 DIAGNOSIS — N2581 Secondary hyperparathyroidism of renal origin: Secondary | ICD-10-CM | POA: Diagnosis not present

## 2019-04-01 DIAGNOSIS — N186 End stage renal disease: Secondary | ICD-10-CM | POA: Diagnosis not present

## 2019-04-01 DIAGNOSIS — Z992 Dependence on renal dialysis: Secondary | ICD-10-CM | POA: Diagnosis not present

## 2019-04-01 DIAGNOSIS — D631 Anemia in chronic kidney disease: Secondary | ICD-10-CM | POA: Diagnosis not present

## 2019-04-04 DIAGNOSIS — D631 Anemia in chronic kidney disease: Secondary | ICD-10-CM | POA: Diagnosis not present

## 2019-04-04 DIAGNOSIS — N2581 Secondary hyperparathyroidism of renal origin: Secondary | ICD-10-CM | POA: Diagnosis not present

## 2019-04-04 DIAGNOSIS — N186 End stage renal disease: Secondary | ICD-10-CM | POA: Diagnosis not present

## 2019-04-04 DIAGNOSIS — D509 Iron deficiency anemia, unspecified: Secondary | ICD-10-CM | POA: Diagnosis not present

## 2019-04-04 DIAGNOSIS — Z992 Dependence on renal dialysis: Secondary | ICD-10-CM | POA: Diagnosis not present

## 2019-04-06 DIAGNOSIS — D631 Anemia in chronic kidney disease: Secondary | ICD-10-CM | POA: Diagnosis not present

## 2019-04-06 DIAGNOSIS — N186 End stage renal disease: Secondary | ICD-10-CM | POA: Diagnosis not present

## 2019-04-06 DIAGNOSIS — Z992 Dependence on renal dialysis: Secondary | ICD-10-CM | POA: Diagnosis not present

## 2019-04-06 DIAGNOSIS — D509 Iron deficiency anemia, unspecified: Secondary | ICD-10-CM | POA: Diagnosis not present

## 2019-04-06 DIAGNOSIS — N2581 Secondary hyperparathyroidism of renal origin: Secondary | ICD-10-CM | POA: Diagnosis not present

## 2019-04-07 ENCOUNTER — Other Ambulatory Visit: Payer: Medicare Other

## 2019-04-08 DIAGNOSIS — D631 Anemia in chronic kidney disease: Secondary | ICD-10-CM | POA: Diagnosis not present

## 2019-04-08 DIAGNOSIS — N186 End stage renal disease: Secondary | ICD-10-CM | POA: Diagnosis not present

## 2019-04-08 DIAGNOSIS — Z992 Dependence on renal dialysis: Secondary | ICD-10-CM | POA: Diagnosis not present

## 2019-04-08 DIAGNOSIS — D509 Iron deficiency anemia, unspecified: Secondary | ICD-10-CM | POA: Diagnosis not present

## 2019-04-08 DIAGNOSIS — N2581 Secondary hyperparathyroidism of renal origin: Secondary | ICD-10-CM | POA: Diagnosis not present

## 2019-04-11 DIAGNOSIS — N2581 Secondary hyperparathyroidism of renal origin: Secondary | ICD-10-CM | POA: Diagnosis not present

## 2019-04-11 DIAGNOSIS — N186 End stage renal disease: Secondary | ICD-10-CM | POA: Diagnosis not present

## 2019-04-11 DIAGNOSIS — D631 Anemia in chronic kidney disease: Secondary | ICD-10-CM | POA: Diagnosis not present

## 2019-04-11 DIAGNOSIS — Z992 Dependence on renal dialysis: Secondary | ICD-10-CM | POA: Diagnosis not present

## 2019-04-11 DIAGNOSIS — D509 Iron deficiency anemia, unspecified: Secondary | ICD-10-CM | POA: Diagnosis not present

## 2019-04-12 DIAGNOSIS — N186 End stage renal disease: Secondary | ICD-10-CM | POA: Diagnosis not present

## 2019-04-12 DIAGNOSIS — E8770 Fluid overload, unspecified: Secondary | ICD-10-CM | POA: Diagnosis not present

## 2019-04-12 DIAGNOSIS — Z7682 Awaiting organ transplant status: Secondary | ICD-10-CM | POA: Diagnosis not present

## 2019-04-12 DIAGNOSIS — N2581 Secondary hyperparathyroidism of renal origin: Secondary | ICD-10-CM | POA: Diagnosis not present

## 2019-04-12 DIAGNOSIS — Z992 Dependence on renal dialysis: Secondary | ICD-10-CM | POA: Diagnosis not present

## 2019-04-13 DIAGNOSIS — N2581 Secondary hyperparathyroidism of renal origin: Secondary | ICD-10-CM | POA: Diagnosis not present

## 2019-04-13 DIAGNOSIS — Z992 Dependence on renal dialysis: Secondary | ICD-10-CM | POA: Diagnosis not present

## 2019-04-13 DIAGNOSIS — D631 Anemia in chronic kidney disease: Secondary | ICD-10-CM | POA: Diagnosis not present

## 2019-04-13 DIAGNOSIS — D509 Iron deficiency anemia, unspecified: Secondary | ICD-10-CM | POA: Diagnosis not present

## 2019-04-13 DIAGNOSIS — N186 End stage renal disease: Secondary | ICD-10-CM | POA: Diagnosis not present

## 2019-04-14 MED FILL — JULUCA 50-25 MG TAB: 50-25 | 30 days supply | Qty: 30 | Fill #1

## 2019-04-14 MED FILL — ZIDOVUDINE 300 MG TABLET: 300 | 30 days supply | Qty: 30 | Fill #1

## 2019-04-15 DIAGNOSIS — D631 Anemia in chronic kidney disease: Secondary | ICD-10-CM | POA: Diagnosis not present

## 2019-04-15 DIAGNOSIS — N186 End stage renal disease: Secondary | ICD-10-CM | POA: Diagnosis not present

## 2019-04-15 DIAGNOSIS — N2581 Secondary hyperparathyroidism of renal origin: Secondary | ICD-10-CM | POA: Diagnosis not present

## 2019-04-15 DIAGNOSIS — D509 Iron deficiency anemia, unspecified: Secondary | ICD-10-CM | POA: Diagnosis not present

## 2019-04-15 DIAGNOSIS — Z992 Dependence on renal dialysis: Secondary | ICD-10-CM | POA: Diagnosis not present

## 2019-04-18 DIAGNOSIS — D631 Anemia in chronic kidney disease: Secondary | ICD-10-CM | POA: Diagnosis not present

## 2019-04-18 DIAGNOSIS — D509 Iron deficiency anemia, unspecified: Secondary | ICD-10-CM | POA: Diagnosis not present

## 2019-04-18 DIAGNOSIS — N2581 Secondary hyperparathyroidism of renal origin: Secondary | ICD-10-CM | POA: Diagnosis not present

## 2019-04-18 DIAGNOSIS — Z992 Dependence on renal dialysis: Secondary | ICD-10-CM | POA: Diagnosis not present

## 2019-04-18 DIAGNOSIS — N186 End stage renal disease: Secondary | ICD-10-CM | POA: Diagnosis not present

## 2019-04-20 DIAGNOSIS — N2581 Secondary hyperparathyroidism of renal origin: Secondary | ICD-10-CM | POA: Diagnosis not present

## 2019-04-20 DIAGNOSIS — D509 Iron deficiency anemia, unspecified: Secondary | ICD-10-CM | POA: Diagnosis not present

## 2019-04-20 DIAGNOSIS — D631 Anemia in chronic kidney disease: Secondary | ICD-10-CM | POA: Diagnosis not present

## 2019-04-20 DIAGNOSIS — Z992 Dependence on renal dialysis: Secondary | ICD-10-CM | POA: Diagnosis not present

## 2019-04-20 DIAGNOSIS — N186 End stage renal disease: Secondary | ICD-10-CM | POA: Diagnosis not present

## 2019-04-21 ENCOUNTER — Encounter: Payer: Medicare Other | Admitting: Internal Medicine

## 2019-04-21 DIAGNOSIS — I120 Hypertensive chronic kidney disease with stage 5 chronic kidney disease or end stage renal disease: Secondary | ICD-10-CM | POA: Diagnosis not present

## 2019-04-21 DIAGNOSIS — Z992 Dependence on renal dialysis: Secondary | ICD-10-CM | POA: Diagnosis not present

## 2019-04-21 DIAGNOSIS — N186 End stage renal disease: Secondary | ICD-10-CM | POA: Diagnosis not present

## 2019-04-22 DIAGNOSIS — Z992 Dependence on renal dialysis: Secondary | ICD-10-CM | POA: Diagnosis not present

## 2019-04-22 DIAGNOSIS — D631 Anemia in chronic kidney disease: Secondary | ICD-10-CM | POA: Diagnosis not present

## 2019-04-22 DIAGNOSIS — N186 End stage renal disease: Secondary | ICD-10-CM | POA: Diagnosis not present

## 2019-04-22 DIAGNOSIS — E8770 Fluid overload, unspecified: Secondary | ICD-10-CM | POA: Diagnosis not present

## 2019-04-22 DIAGNOSIS — D509 Iron deficiency anemia, unspecified: Secondary | ICD-10-CM | POA: Diagnosis not present

## 2019-04-22 DIAGNOSIS — N2581 Secondary hyperparathyroidism of renal origin: Secondary | ICD-10-CM | POA: Diagnosis not present

## 2019-04-25 DIAGNOSIS — E8770 Fluid overload, unspecified: Secondary | ICD-10-CM | POA: Diagnosis not present

## 2019-04-25 DIAGNOSIS — Z992 Dependence on renal dialysis: Secondary | ICD-10-CM | POA: Diagnosis not present

## 2019-04-25 DIAGNOSIS — D509 Iron deficiency anemia, unspecified: Secondary | ICD-10-CM | POA: Diagnosis not present

## 2019-04-25 DIAGNOSIS — N2581 Secondary hyperparathyroidism of renal origin: Secondary | ICD-10-CM | POA: Diagnosis not present

## 2019-04-25 DIAGNOSIS — N186 End stage renal disease: Secondary | ICD-10-CM | POA: Diagnosis not present

## 2019-04-25 DIAGNOSIS — D631 Anemia in chronic kidney disease: Secondary | ICD-10-CM | POA: Diagnosis not present

## 2019-04-27 DIAGNOSIS — D509 Iron deficiency anemia, unspecified: Secondary | ICD-10-CM | POA: Diagnosis not present

## 2019-04-27 DIAGNOSIS — N186 End stage renal disease: Secondary | ICD-10-CM | POA: Diagnosis not present

## 2019-04-27 DIAGNOSIS — N2581 Secondary hyperparathyroidism of renal origin: Secondary | ICD-10-CM | POA: Diagnosis not present

## 2019-04-27 DIAGNOSIS — D631 Anemia in chronic kidney disease: Secondary | ICD-10-CM | POA: Diagnosis not present

## 2019-04-27 DIAGNOSIS — E8770 Fluid overload, unspecified: Secondary | ICD-10-CM | POA: Diagnosis not present

## 2019-04-27 DIAGNOSIS — Z992 Dependence on renal dialysis: Secondary | ICD-10-CM | POA: Diagnosis not present

## 2019-04-29 DIAGNOSIS — Z992 Dependence on renal dialysis: Secondary | ICD-10-CM | POA: Diagnosis not present

## 2019-04-29 DIAGNOSIS — N2581 Secondary hyperparathyroidism of renal origin: Secondary | ICD-10-CM | POA: Diagnosis not present

## 2019-04-29 DIAGNOSIS — D509 Iron deficiency anemia, unspecified: Secondary | ICD-10-CM | POA: Diagnosis not present

## 2019-04-29 DIAGNOSIS — E8770 Fluid overload, unspecified: Secondary | ICD-10-CM | POA: Diagnosis not present

## 2019-04-29 DIAGNOSIS — N186 End stage renal disease: Secondary | ICD-10-CM | POA: Diagnosis not present

## 2019-04-29 DIAGNOSIS — D631 Anemia in chronic kidney disease: Secondary | ICD-10-CM | POA: Diagnosis not present

## 2019-05-02 DIAGNOSIS — N2581 Secondary hyperparathyroidism of renal origin: Secondary | ICD-10-CM | POA: Diagnosis not present

## 2019-05-02 DIAGNOSIS — N186 End stage renal disease: Secondary | ICD-10-CM | POA: Diagnosis not present

## 2019-05-02 DIAGNOSIS — E8770 Fluid overload, unspecified: Secondary | ICD-10-CM | POA: Diagnosis not present

## 2019-05-02 DIAGNOSIS — Z992 Dependence on renal dialysis: Secondary | ICD-10-CM | POA: Diagnosis not present

## 2019-05-02 DIAGNOSIS — D631 Anemia in chronic kidney disease: Secondary | ICD-10-CM | POA: Diagnosis not present

## 2019-05-02 DIAGNOSIS — D509 Iron deficiency anemia, unspecified: Secondary | ICD-10-CM | POA: Diagnosis not present

## 2019-05-03 DIAGNOSIS — E8770 Fluid overload, unspecified: Secondary | ICD-10-CM | POA: Diagnosis not present

## 2019-05-03 DIAGNOSIS — N2581 Secondary hyperparathyroidism of renal origin: Secondary | ICD-10-CM | POA: Diagnosis not present

## 2019-05-03 DIAGNOSIS — Z992 Dependence on renal dialysis: Secondary | ICD-10-CM | POA: Diagnosis not present

## 2019-05-03 DIAGNOSIS — D509 Iron deficiency anemia, unspecified: Secondary | ICD-10-CM | POA: Diagnosis not present

## 2019-05-03 DIAGNOSIS — N186 End stage renal disease: Secondary | ICD-10-CM | POA: Diagnosis not present

## 2019-05-03 DIAGNOSIS — D631 Anemia in chronic kidney disease: Secondary | ICD-10-CM | POA: Diagnosis not present

## 2019-05-04 DIAGNOSIS — Z992 Dependence on renal dialysis: Secondary | ICD-10-CM | POA: Diagnosis not present

## 2019-05-04 DIAGNOSIS — N2581 Secondary hyperparathyroidism of renal origin: Secondary | ICD-10-CM | POA: Diagnosis not present

## 2019-05-04 DIAGNOSIS — N186 End stage renal disease: Secondary | ICD-10-CM | POA: Diagnosis not present

## 2019-05-04 DIAGNOSIS — D509 Iron deficiency anemia, unspecified: Secondary | ICD-10-CM | POA: Diagnosis not present

## 2019-05-04 DIAGNOSIS — D631 Anemia in chronic kidney disease: Secondary | ICD-10-CM | POA: Diagnosis not present

## 2019-05-04 DIAGNOSIS — E8770 Fluid overload, unspecified: Secondary | ICD-10-CM | POA: Diagnosis not present

## 2019-05-06 DIAGNOSIS — Z992 Dependence on renal dialysis: Secondary | ICD-10-CM | POA: Diagnosis not present

## 2019-05-06 DIAGNOSIS — D509 Iron deficiency anemia, unspecified: Secondary | ICD-10-CM | POA: Diagnosis not present

## 2019-05-06 DIAGNOSIS — N2581 Secondary hyperparathyroidism of renal origin: Secondary | ICD-10-CM | POA: Diagnosis not present

## 2019-05-06 DIAGNOSIS — N186 End stage renal disease: Secondary | ICD-10-CM | POA: Diagnosis not present

## 2019-05-06 DIAGNOSIS — D631 Anemia in chronic kidney disease: Secondary | ICD-10-CM | POA: Diagnosis not present

## 2019-05-06 DIAGNOSIS — E8770 Fluid overload, unspecified: Secondary | ICD-10-CM | POA: Diagnosis not present

## 2019-05-09 DIAGNOSIS — D509 Iron deficiency anemia, unspecified: Secondary | ICD-10-CM | POA: Diagnosis not present

## 2019-05-09 DIAGNOSIS — Z992 Dependence on renal dialysis: Secondary | ICD-10-CM | POA: Diagnosis not present

## 2019-05-09 DIAGNOSIS — E8770 Fluid overload, unspecified: Secondary | ICD-10-CM | POA: Diagnosis not present

## 2019-05-09 DIAGNOSIS — D631 Anemia in chronic kidney disease: Secondary | ICD-10-CM | POA: Diagnosis not present

## 2019-05-09 DIAGNOSIS — N2581 Secondary hyperparathyroidism of renal origin: Secondary | ICD-10-CM | POA: Diagnosis not present

## 2019-05-09 DIAGNOSIS — N186 End stage renal disease: Secondary | ICD-10-CM | POA: Diagnosis not present

## 2019-05-09 MED FILL — JULUCA 50-25 MG TAB: 50-25 | 30 days supply | Qty: 30 | Fill #2

## 2019-05-09 MED FILL — ZIDOVUDINE 300 MG TABLET: 300 | 30 days supply | Qty: 30 | Fill #2

## 2019-05-10 DIAGNOSIS — Z0000 Encounter for general adult medical examination without abnormal findings: Secondary | ICD-10-CM | POA: Diagnosis not present

## 2019-05-10 DIAGNOSIS — D631 Anemia in chronic kidney disease: Secondary | ICD-10-CM | POA: Diagnosis not present

## 2019-05-10 DIAGNOSIS — Z79899 Other long term (current) drug therapy: Secondary | ICD-10-CM | POA: Diagnosis not present

## 2019-05-10 DIAGNOSIS — I5032 Chronic diastolic (congestive) heart failure: Secondary | ICD-10-CM | POA: Diagnosis not present

## 2019-05-10 DIAGNOSIS — Z125 Encounter for screening for malignant neoplasm of prostate: Secondary | ICD-10-CM | POA: Diagnosis not present

## 2019-05-11 DIAGNOSIS — N186 End stage renal disease: Secondary | ICD-10-CM | POA: Diagnosis not present

## 2019-05-11 DIAGNOSIS — Z992 Dependence on renal dialysis: Secondary | ICD-10-CM | POA: Diagnosis not present

## 2019-05-11 DIAGNOSIS — D509 Iron deficiency anemia, unspecified: Secondary | ICD-10-CM | POA: Diagnosis not present

## 2019-05-11 DIAGNOSIS — E8770 Fluid overload, unspecified: Secondary | ICD-10-CM | POA: Diagnosis not present

## 2019-05-11 DIAGNOSIS — D631 Anemia in chronic kidney disease: Secondary | ICD-10-CM | POA: Diagnosis not present

## 2019-05-11 DIAGNOSIS — N2581 Secondary hyperparathyroidism of renal origin: Secondary | ICD-10-CM | POA: Diagnosis not present

## 2019-05-13 DIAGNOSIS — Z992 Dependence on renal dialysis: Secondary | ICD-10-CM | POA: Diagnosis not present

## 2019-05-13 DIAGNOSIS — N186 End stage renal disease: Secondary | ICD-10-CM | POA: Diagnosis not present

## 2019-05-13 DIAGNOSIS — D509 Iron deficiency anemia, unspecified: Secondary | ICD-10-CM | POA: Diagnosis not present

## 2019-05-13 DIAGNOSIS — E8770 Fluid overload, unspecified: Secondary | ICD-10-CM | POA: Diagnosis not present

## 2019-05-13 DIAGNOSIS — N2581 Secondary hyperparathyroidism of renal origin: Secondary | ICD-10-CM | POA: Diagnosis not present

## 2019-05-13 DIAGNOSIS — D631 Anemia in chronic kidney disease: Secondary | ICD-10-CM | POA: Diagnosis not present

## 2019-05-16 DIAGNOSIS — D509 Iron deficiency anemia, unspecified: Secondary | ICD-10-CM | POA: Diagnosis not present

## 2019-05-16 DIAGNOSIS — N2581 Secondary hyperparathyroidism of renal origin: Secondary | ICD-10-CM | POA: Diagnosis not present

## 2019-05-16 DIAGNOSIS — N186 End stage renal disease: Secondary | ICD-10-CM | POA: Diagnosis not present

## 2019-05-16 DIAGNOSIS — E8770 Fluid overload, unspecified: Secondary | ICD-10-CM | POA: Diagnosis not present

## 2019-05-16 DIAGNOSIS — Z992 Dependence on renal dialysis: Secondary | ICD-10-CM | POA: Diagnosis not present

## 2019-05-16 DIAGNOSIS — D631 Anemia in chronic kidney disease: Secondary | ICD-10-CM | POA: Diagnosis not present

## 2019-05-18 DIAGNOSIS — D631 Anemia in chronic kidney disease: Secondary | ICD-10-CM | POA: Diagnosis not present

## 2019-05-18 DIAGNOSIS — N186 End stage renal disease: Secondary | ICD-10-CM | POA: Diagnosis not present

## 2019-05-18 DIAGNOSIS — D509 Iron deficiency anemia, unspecified: Secondary | ICD-10-CM | POA: Diagnosis not present

## 2019-05-18 DIAGNOSIS — E8770 Fluid overload, unspecified: Secondary | ICD-10-CM | POA: Diagnosis not present

## 2019-05-18 DIAGNOSIS — Z992 Dependence on renal dialysis: Secondary | ICD-10-CM | POA: Diagnosis not present

## 2019-05-18 DIAGNOSIS — N2581 Secondary hyperparathyroidism of renal origin: Secondary | ICD-10-CM | POA: Diagnosis not present

## 2019-05-20 DIAGNOSIS — N186 End stage renal disease: Secondary | ICD-10-CM | POA: Diagnosis not present

## 2019-05-20 DIAGNOSIS — D631 Anemia in chronic kidney disease: Secondary | ICD-10-CM | POA: Diagnosis not present

## 2019-05-20 DIAGNOSIS — E8770 Fluid overload, unspecified: Secondary | ICD-10-CM | POA: Diagnosis not present

## 2019-05-20 DIAGNOSIS — N2581 Secondary hyperparathyroidism of renal origin: Secondary | ICD-10-CM | POA: Diagnosis not present

## 2019-05-20 DIAGNOSIS — D509 Iron deficiency anemia, unspecified: Secondary | ICD-10-CM | POA: Diagnosis not present

## 2019-05-20 DIAGNOSIS — Z992 Dependence on renal dialysis: Secondary | ICD-10-CM | POA: Diagnosis not present

## 2019-05-22 DIAGNOSIS — I120 Hypertensive chronic kidney disease with stage 5 chronic kidney disease or end stage renal disease: Secondary | ICD-10-CM | POA: Diagnosis not present

## 2019-05-22 DIAGNOSIS — Z992 Dependence on renal dialysis: Secondary | ICD-10-CM | POA: Diagnosis not present

## 2019-05-22 DIAGNOSIS — N186 End stage renal disease: Secondary | ICD-10-CM | POA: Diagnosis not present

## 2019-05-23 DIAGNOSIS — N2581 Secondary hyperparathyroidism of renal origin: Secondary | ICD-10-CM | POA: Diagnosis not present

## 2019-05-23 DIAGNOSIS — Z992 Dependence on renal dialysis: Secondary | ICD-10-CM | POA: Diagnosis not present

## 2019-05-23 DIAGNOSIS — D631 Anemia in chronic kidney disease: Secondary | ICD-10-CM | POA: Diagnosis not present

## 2019-05-23 DIAGNOSIS — E8770 Fluid overload, unspecified: Secondary | ICD-10-CM | POA: Diagnosis not present

## 2019-05-23 DIAGNOSIS — D509 Iron deficiency anemia, unspecified: Secondary | ICD-10-CM | POA: Diagnosis not present

## 2019-05-23 DIAGNOSIS — N186 End stage renal disease: Secondary | ICD-10-CM | POA: Diagnosis not present

## 2019-05-25 DIAGNOSIS — N2581 Secondary hyperparathyroidism of renal origin: Secondary | ICD-10-CM | POA: Diagnosis not present

## 2019-05-25 DIAGNOSIS — E8770 Fluid overload, unspecified: Secondary | ICD-10-CM | POA: Diagnosis not present

## 2019-05-25 DIAGNOSIS — N186 End stage renal disease: Secondary | ICD-10-CM | POA: Diagnosis not present

## 2019-05-25 DIAGNOSIS — D631 Anemia in chronic kidney disease: Secondary | ICD-10-CM | POA: Diagnosis not present

## 2019-05-25 DIAGNOSIS — D509 Iron deficiency anemia, unspecified: Secondary | ICD-10-CM | POA: Diagnosis not present

## 2019-05-25 DIAGNOSIS — Z992 Dependence on renal dialysis: Secondary | ICD-10-CM | POA: Diagnosis not present

## 2019-05-27 DIAGNOSIS — D509 Iron deficiency anemia, unspecified: Secondary | ICD-10-CM | POA: Diagnosis not present

## 2019-05-27 DIAGNOSIS — N2581 Secondary hyperparathyroidism of renal origin: Secondary | ICD-10-CM | POA: Diagnosis not present

## 2019-05-27 DIAGNOSIS — N186 End stage renal disease: Secondary | ICD-10-CM | POA: Diagnosis not present

## 2019-05-27 DIAGNOSIS — E8770 Fluid overload, unspecified: Secondary | ICD-10-CM | POA: Diagnosis not present

## 2019-05-27 DIAGNOSIS — Z992 Dependence on renal dialysis: Secondary | ICD-10-CM | POA: Diagnosis not present

## 2019-05-27 DIAGNOSIS — D631 Anemia in chronic kidney disease: Secondary | ICD-10-CM | POA: Diagnosis not present

## 2019-05-30 DIAGNOSIS — D631 Anemia in chronic kidney disease: Secondary | ICD-10-CM | POA: Diagnosis not present

## 2019-05-30 DIAGNOSIS — D509 Iron deficiency anemia, unspecified: Secondary | ICD-10-CM | POA: Diagnosis not present

## 2019-05-30 DIAGNOSIS — N186 End stage renal disease: Secondary | ICD-10-CM | POA: Diagnosis not present

## 2019-05-30 DIAGNOSIS — Z992 Dependence on renal dialysis: Secondary | ICD-10-CM | POA: Diagnosis not present

## 2019-05-30 DIAGNOSIS — N2581 Secondary hyperparathyroidism of renal origin: Secondary | ICD-10-CM | POA: Diagnosis not present

## 2019-05-30 DIAGNOSIS — E8770 Fluid overload, unspecified: Secondary | ICD-10-CM | POA: Diagnosis not present

## 2019-05-30 IMAGING — CT CT PELVIS W/ CM
2 of 3 series · 17 of 46 positions shown, 19 images · IV contrast (Omni 300)
Comparison: Ultrasound 02/28/2017.  CT of 09/29/2014.

CLINICAL DATA: Scrotal mass . Dialysis patient with pain for the
past 2 weeks.

EXAM:
CT PELVIS WITH CONTRAST
TECHNIQUE: Multidetector CT imaging of the pelvis was performed using the
standard protocol following the bolus administration of intravenous
contrast.
CONTRAST:  100mL MBM0MB-UYY IOPAMIDOL (MBM0MB-UYY) INJECTION 61%

[Series 3: pelvis with 5.0 · axial · 0.98mm/px · z∈[-294,+66]mm · 14 of 84 slices shown, 16 images]
[im 6/84  soft-tissue]
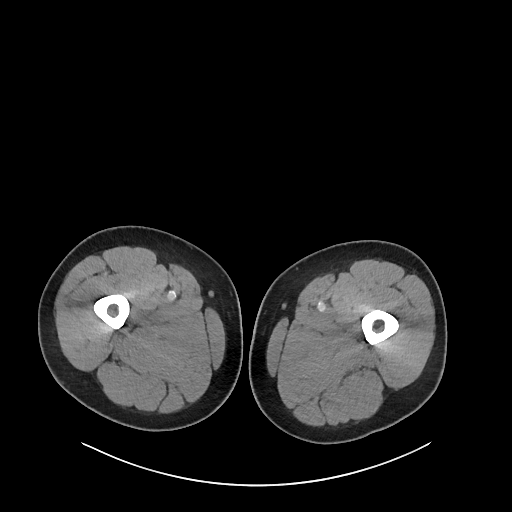
[im 6/84  bone]
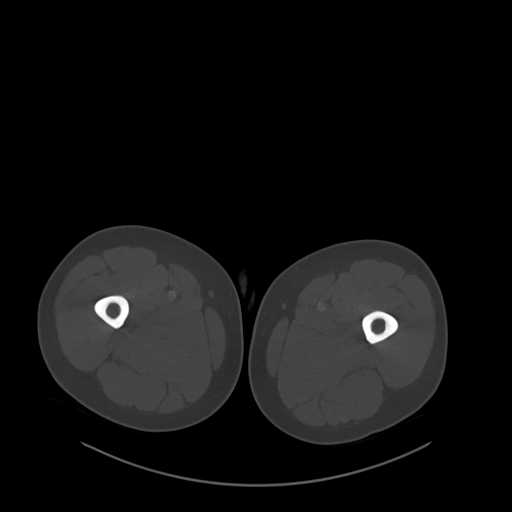
[im 11/84  soft-tissue]
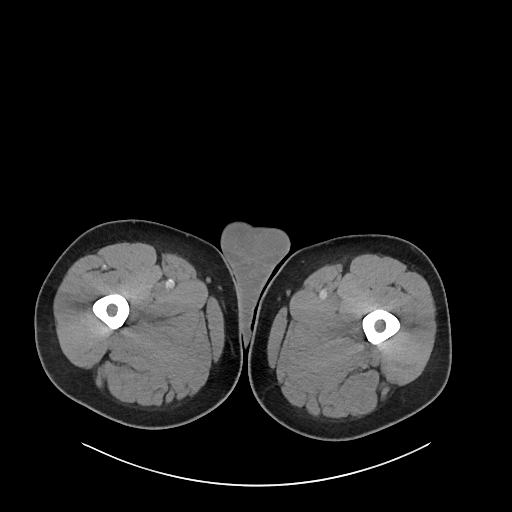
[im 17/84  soft-tissue]
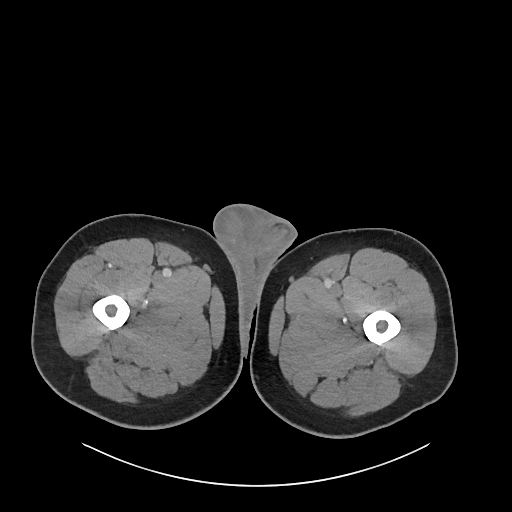
[im 22/84  soft-tissue]
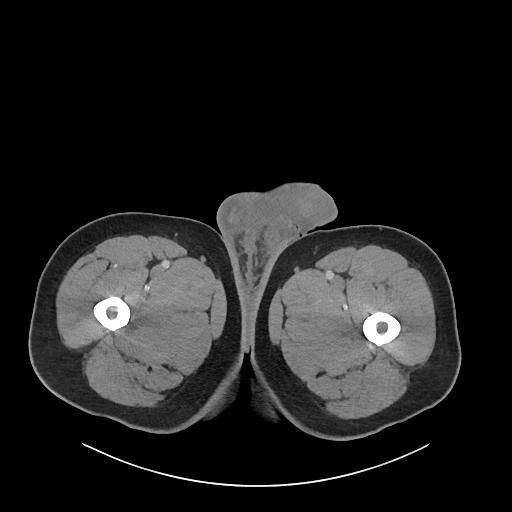
[im 27/84  soft-tissue]
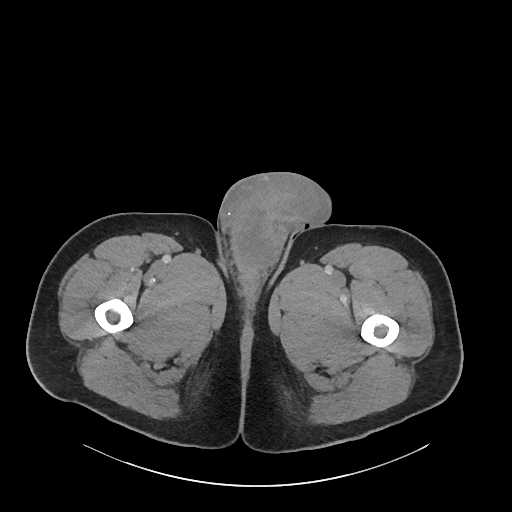
[im 33/84  soft-tissue]
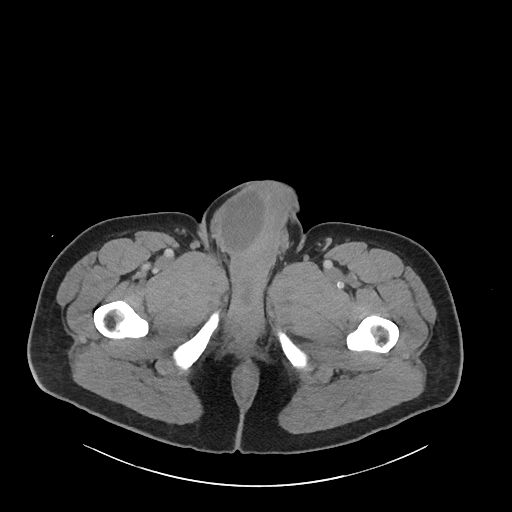
[im 38/84  soft-tissue]
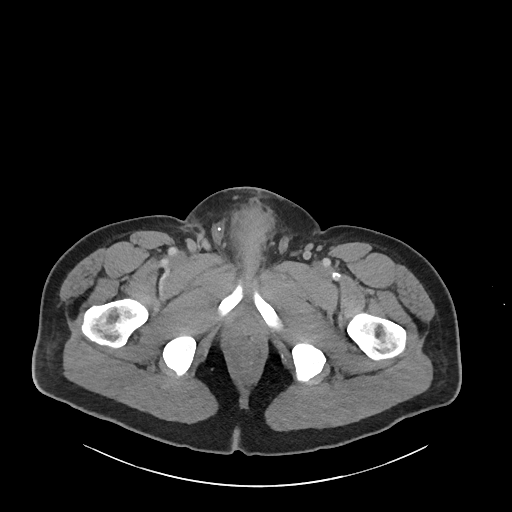
[im 46/84  soft-tissue]
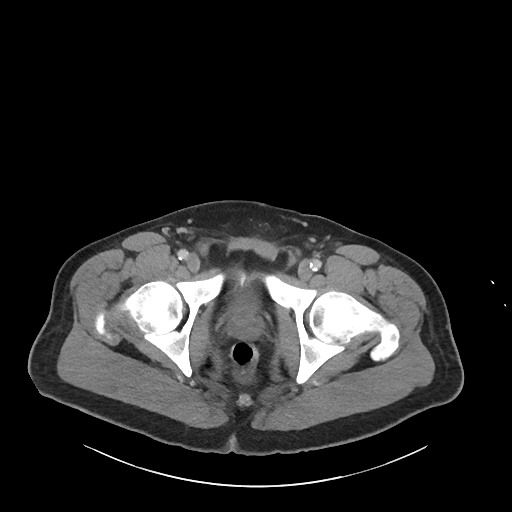
[im 51/84  soft-tissue]
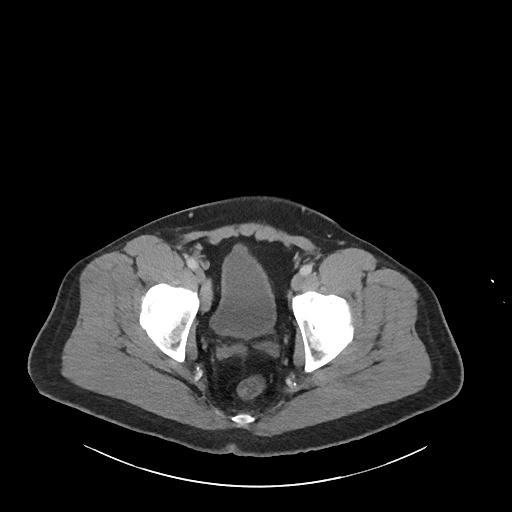
[im 51/84  bone]
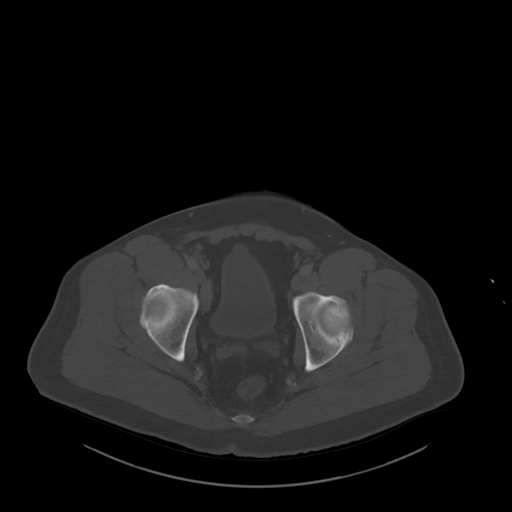
[im 57/84  soft-tissue]
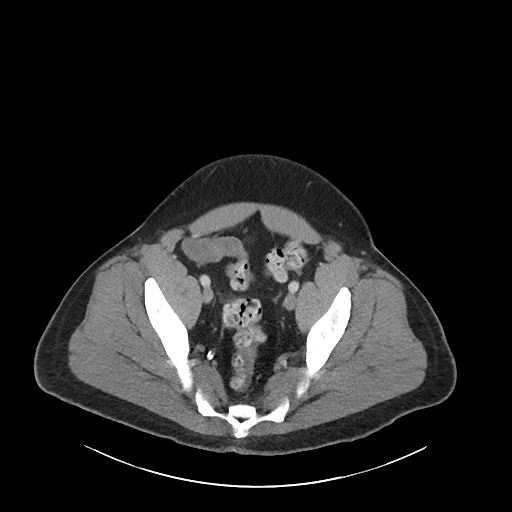
[im 62/84  soft-tissue]
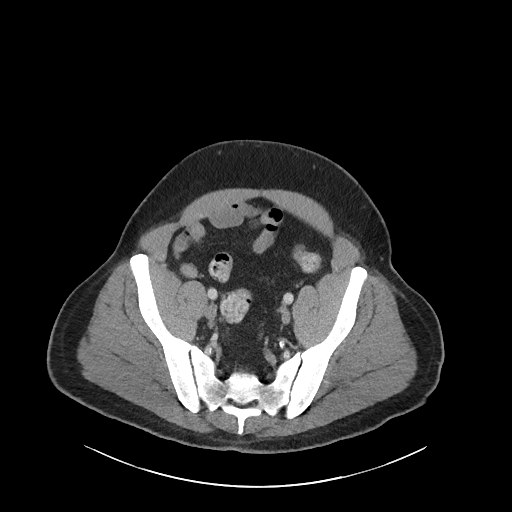
[im 67/84  soft-tissue]
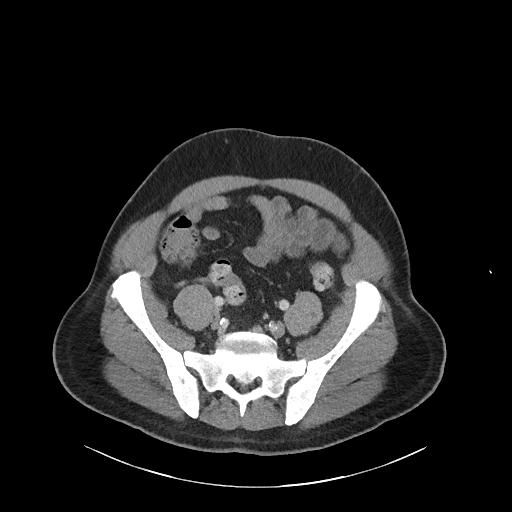
[im 73/84  soft-tissue]
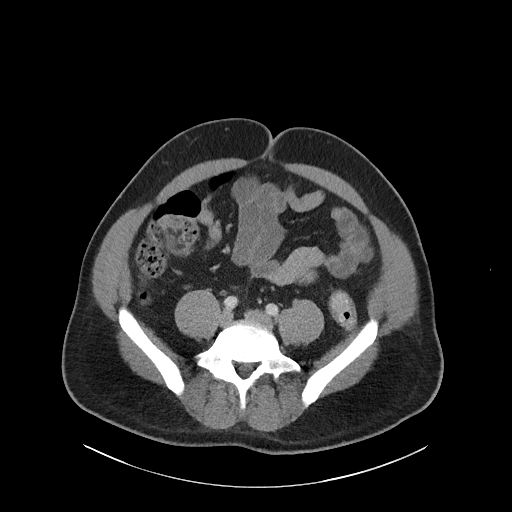
[im 78/84  soft-tissue]
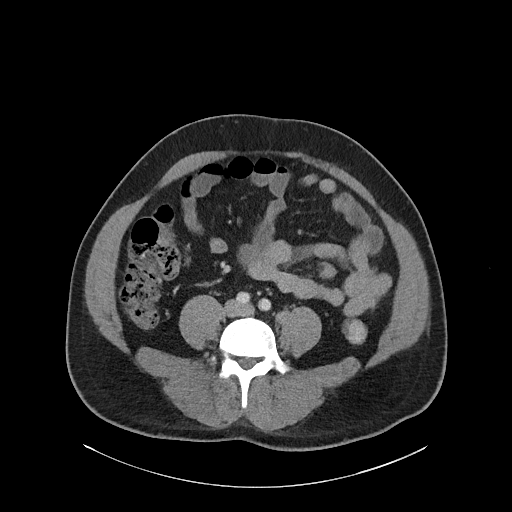

[Series 5: pelvis with 2.0 cor · coronal · 0.83mm/px · 3 of 151 slices shown]
[im 51/151  soft-tissue]
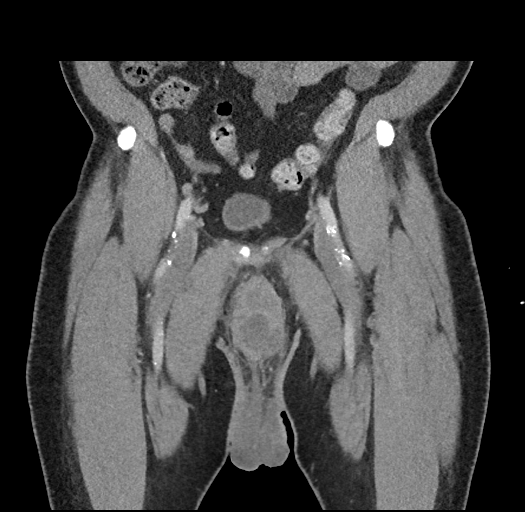
[im 67/151  soft-tissue]
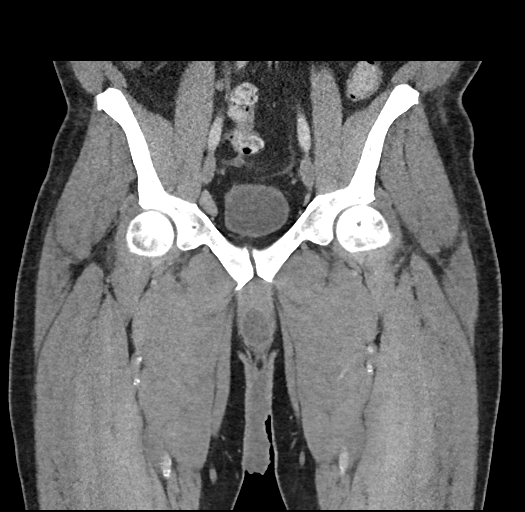
[im 84/151  soft-tissue]
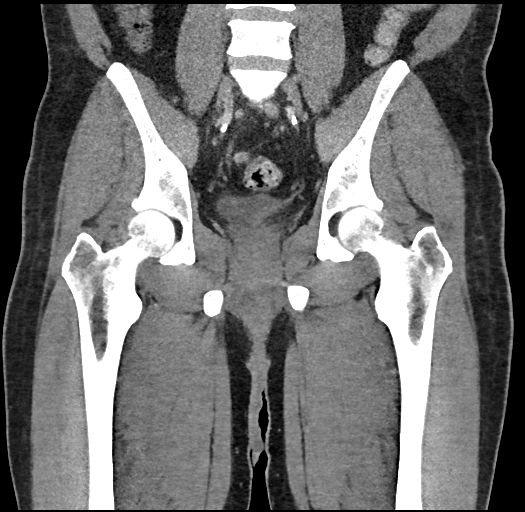

[17 of 46 positions shown; findings below may reference images not displayed]

FINDINGS: Urinary Tract:  No distal hydroureter.  Normal urinary bladder.

Bowel: Normal colon, appendix, and terminal ileum. Normal pelvic
small bowel.

Vascular/Lymphatic: Aortic and branch vessel atherosclerosis. Right
external iliac node is enlarged at 1.4 cm on image 35/series 3. A
right inguinal node measures 1.3 cm on image 45/series 3.

Reproductive: Normal prostate. Small bilateral hydroceles. Edema
throughout the scrotum.

There is edema anterior to the symphysis pubis and cephalad to the
penis on image 45/series 3. There is an irregularly-shaped fluid
collection which is positioned at the base of the penis, eccentric
right. This does cross the midline, measuring 5.9 x 11.4 cm on image
56/series 3. Displaces the corpora to the left.

Other:  No significant free fluid.

Musculoskeletal: Left femoral head avascular necrosis is new since
09/29/2014.
IMPRESSION: 1. Fluid collection centered at the right penile base. Given the
clinical history and adjacent scrotal and subcutaneous edema,
favored to represent an abscess.
2. Pelvic adenopathy, likely reactive. Consider CT followup at 3
months to confirm resolution.
3.  Aortic Atherosclerosis ([id number]).
4. Small bilateral hydroceles with diffuse scrotal edema.
5. Left femoral head avascular necrosis.

## 2019-05-30 IMAGING — DX DG CHEST 1V PORT
1 series · 1 of 1 positions shown · non-contrast
Comparison: Chest radiograph 06/16/2016

CLINICAL DATA: Preoperative film.  Hypertension.

EXAM:
PORTABLE CHEST 1 VIEW

[chest]
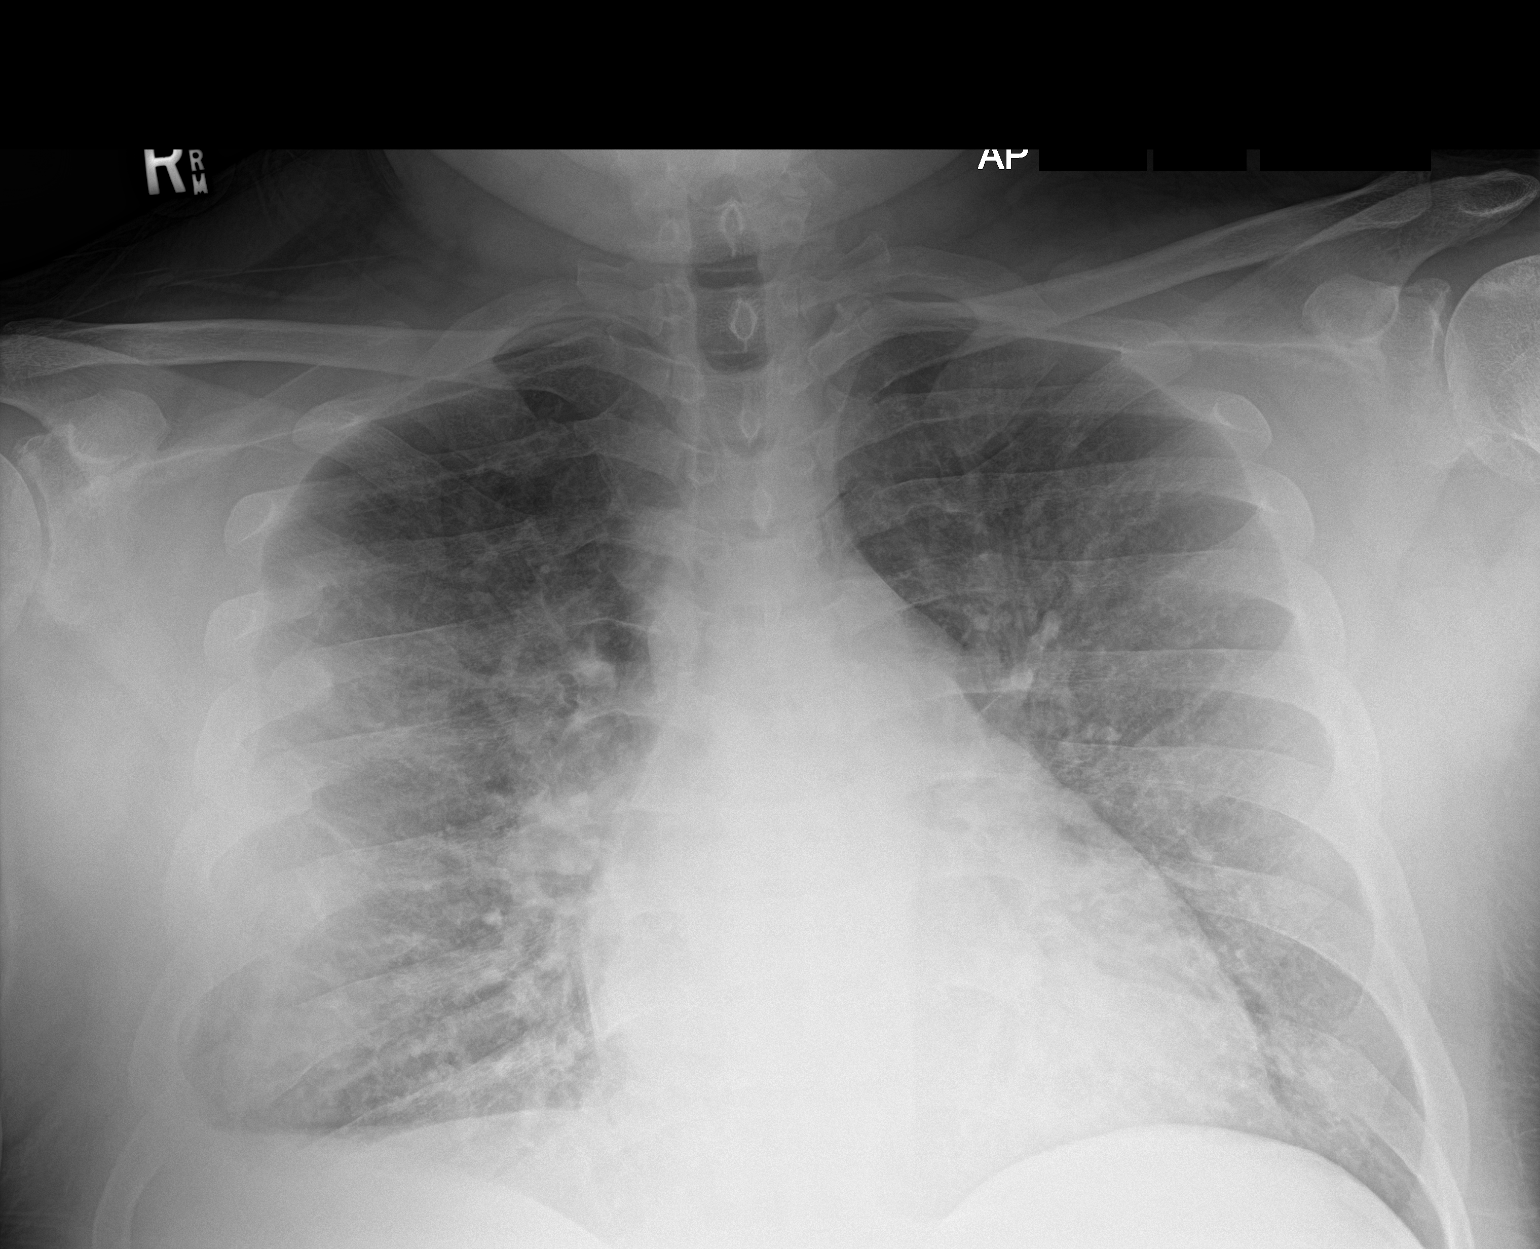

[1 of 1 positions shown; findings below may reference images not displayed]

FINDINGS: Unchanged cardiomegaly with bilateral interstitial opacities. No
pneumothorax. Suspected small right pleural effusion.
IMPRESSION: Cardiomegaly and mild interstitial edema with suspected small right
pleural effusion.

## 2019-06-01 DIAGNOSIS — D509 Iron deficiency anemia, unspecified: Secondary | ICD-10-CM | POA: Diagnosis not present

## 2019-06-01 DIAGNOSIS — D631 Anemia in chronic kidney disease: Secondary | ICD-10-CM | POA: Diagnosis not present

## 2019-06-01 DIAGNOSIS — E8770 Fluid overload, unspecified: Secondary | ICD-10-CM | POA: Diagnosis not present

## 2019-06-01 DIAGNOSIS — N2581 Secondary hyperparathyroidism of renal origin: Secondary | ICD-10-CM | POA: Diagnosis not present

## 2019-06-01 DIAGNOSIS — Z992 Dependence on renal dialysis: Secondary | ICD-10-CM | POA: Diagnosis not present

## 2019-06-01 DIAGNOSIS — N186 End stage renal disease: Secondary | ICD-10-CM | POA: Diagnosis not present

## 2019-06-02 ENCOUNTER — Other Ambulatory Visit (INDEPENDENT_AMBULATORY_CARE_PROVIDER_SITE_OTHER): Payer: Self-pay | Admitting: Primary Care

## 2019-06-02 NOTE — Telephone Encounter (Signed)
FWD to PCP. Ulmer Degen S Colleena Kurtenbach, CMA  

## 2019-06-03 ENCOUNTER — Other Ambulatory Visit (INDEPENDENT_AMBULATORY_CARE_PROVIDER_SITE_OTHER): Payer: Self-pay | Admitting: Primary Care

## 2019-06-03 DIAGNOSIS — N2581 Secondary hyperparathyroidism of renal origin: Secondary | ICD-10-CM | POA: Diagnosis not present

## 2019-06-03 DIAGNOSIS — E8770 Fluid overload, unspecified: Secondary | ICD-10-CM | POA: Diagnosis not present

## 2019-06-03 DIAGNOSIS — N186 End stage renal disease: Secondary | ICD-10-CM | POA: Diagnosis not present

## 2019-06-03 DIAGNOSIS — D509 Iron deficiency anemia, unspecified: Secondary | ICD-10-CM | POA: Diagnosis not present

## 2019-06-03 DIAGNOSIS — Z992 Dependence on renal dialysis: Secondary | ICD-10-CM | POA: Diagnosis not present

## 2019-06-03 DIAGNOSIS — D631 Anemia in chronic kidney disease: Secondary | ICD-10-CM | POA: Diagnosis not present

## 2019-06-03 MED FILL — JULUCA 50-25 MG TAB: 50-25 | 30 days supply | Qty: 30 | Fill #3

## 2019-06-03 MED FILL — ZIDOVUDINE 300 MG TABLET: 300 | 30 days supply | Qty: 30 | Fill #3

## 2019-06-03 NOTE — Telephone Encounter (Signed)
Needs appointment

## 2019-06-03 NOTE — Telephone Encounter (Signed)
FWD to PCP. Darrell Johnson, CMA  

## 2019-06-04 DIAGNOSIS — N186 End stage renal disease: Secondary | ICD-10-CM | POA: Diagnosis not present

## 2019-06-04 DIAGNOSIS — Z992 Dependence on renal dialysis: Secondary | ICD-10-CM | POA: Diagnosis not present

## 2019-06-04 DIAGNOSIS — D631 Anemia in chronic kidney disease: Secondary | ICD-10-CM | POA: Diagnosis not present

## 2019-06-04 DIAGNOSIS — N2581 Secondary hyperparathyroidism of renal origin: Secondary | ICD-10-CM | POA: Diagnosis not present

## 2019-06-04 DIAGNOSIS — E8770 Fluid overload, unspecified: Secondary | ICD-10-CM | POA: Diagnosis not present

## 2019-06-04 DIAGNOSIS — D509 Iron deficiency anemia, unspecified: Secondary | ICD-10-CM | POA: Diagnosis not present

## 2019-06-05 ENCOUNTER — Other Ambulatory Visit (INDEPENDENT_AMBULATORY_CARE_PROVIDER_SITE_OTHER): Payer: Self-pay | Admitting: Primary Care

## 2019-06-06 DIAGNOSIS — N2581 Secondary hyperparathyroidism of renal origin: Secondary | ICD-10-CM | POA: Diagnosis not present

## 2019-06-06 DIAGNOSIS — D631 Anemia in chronic kidney disease: Secondary | ICD-10-CM | POA: Diagnosis not present

## 2019-06-06 DIAGNOSIS — Z992 Dependence on renal dialysis: Secondary | ICD-10-CM | POA: Diagnosis not present

## 2019-06-06 DIAGNOSIS — N186 End stage renal disease: Secondary | ICD-10-CM | POA: Diagnosis not present

## 2019-06-06 DIAGNOSIS — D509 Iron deficiency anemia, unspecified: Secondary | ICD-10-CM | POA: Diagnosis not present

## 2019-06-06 DIAGNOSIS — E8770 Fluid overload, unspecified: Secondary | ICD-10-CM | POA: Diagnosis not present

## 2019-06-08 DIAGNOSIS — D509 Iron deficiency anemia, unspecified: Secondary | ICD-10-CM | POA: Diagnosis not present

## 2019-06-08 DIAGNOSIS — N186 End stage renal disease: Secondary | ICD-10-CM | POA: Diagnosis not present

## 2019-06-08 DIAGNOSIS — E8770 Fluid overload, unspecified: Secondary | ICD-10-CM | POA: Diagnosis not present

## 2019-06-08 DIAGNOSIS — Z992 Dependence on renal dialysis: Secondary | ICD-10-CM | POA: Diagnosis not present

## 2019-06-08 DIAGNOSIS — D631 Anemia in chronic kidney disease: Secondary | ICD-10-CM | POA: Diagnosis not present

## 2019-06-08 DIAGNOSIS — N2581 Secondary hyperparathyroidism of renal origin: Secondary | ICD-10-CM | POA: Diagnosis not present

## 2019-06-09 ENCOUNTER — Encounter (HOSPITAL_COMMUNITY)
Admission: EM | Disposition: A | Discharge status: Home / Self Care | Payer: Self-pay | Source: Home / Self Care | Attending: Emergency Medicine

## 2019-06-09 ENCOUNTER — Encounter (HOSPITAL_COMMUNITY): Payer: Self-pay | Admitting: Emergency Medicine

## 2019-06-09 ENCOUNTER — Emergency Department (HOSPITAL_COMMUNITY): Payer: Medicare Other | Admitting: Certified Registered Nurse Anesthetist

## 2019-06-09 ENCOUNTER — Ambulatory Visit (HOSPITAL_COMMUNITY)
Admission: EM | Admit: 2019-06-09 | Discharge: 2019-06-09 | Disposition: A | Discharge status: Home / Self Care | Payer: Medicare Other | Attending: Emergency Medicine | Admitting: Emergency Medicine

## 2019-06-09 DIAGNOSIS — J45909 Unspecified asthma, uncomplicated: Secondary | ICD-10-CM | POA: Diagnosis not present

## 2019-06-09 DIAGNOSIS — D631 Anemia in chronic kidney disease: Secondary | ICD-10-CM | POA: Diagnosis not present

## 2019-06-09 DIAGNOSIS — Z7951 Long term (current) use of inhaled steroids: Secondary | ICD-10-CM | POA: Insufficient documentation

## 2019-06-09 DIAGNOSIS — Z87891 Personal history of nicotine dependence: Secondary | ICD-10-CM | POA: Insufficient documentation

## 2019-06-09 DIAGNOSIS — Z20828 Contact with and (suspected) exposure to other viral communicable diseases: Secondary | ICD-10-CM | POA: Insufficient documentation

## 2019-06-09 DIAGNOSIS — I132 Hypertensive heart and chronic kidney disease with heart failure and with stage 5 chronic kidney disease, or end stage renal disease: Secondary | ICD-10-CM | POA: Diagnosis not present

## 2019-06-09 DIAGNOSIS — I5033 Acute on chronic diastolic (congestive) heart failure: Secondary | ICD-10-CM | POA: Diagnosis not present

## 2019-06-09 DIAGNOSIS — N186 End stage renal disease: Secondary | ICD-10-CM | POA: Insufficient documentation

## 2019-06-09 DIAGNOSIS — F418 Other specified anxiety disorders: Secondary | ICD-10-CM | POA: Insufficient documentation

## 2019-06-09 DIAGNOSIS — Y832 Surgical operation with anastomosis, bypass or graft as the cause of abnormal reaction of the patient, or of later complication, without mention of misadventure at the time of the procedure: Secondary | ICD-10-CM | POA: Diagnosis not present

## 2019-06-09 DIAGNOSIS — T8249XA Other complication of vascular dialysis catheter, initial encounter: Secondary | ICD-10-CM | POA: Diagnosis not present

## 2019-06-09 DIAGNOSIS — Z992 Dependence on renal dialysis: Secondary | ICD-10-CM | POA: Insufficient documentation

## 2019-06-09 DIAGNOSIS — T82898A Other specified complication of vascular prosthetic devices, implants and grafts, initial encounter: Secondary | ICD-10-CM | POA: Diagnosis not present

## 2019-06-09 DIAGNOSIS — M170 Bilateral primary osteoarthritis of knee: Secondary | ICD-10-CM | POA: Insufficient documentation

## 2019-06-09 DIAGNOSIS — I5032 Chronic diastolic (congestive) heart failure: Secondary | ICD-10-CM | POA: Insufficient documentation

## 2019-06-09 DIAGNOSIS — T829XXA Unspecified complication of cardiac and vascular prosthetic device, implant and graft, initial encounter: Secondary | ICD-10-CM

## 2019-06-09 DIAGNOSIS — Z03818 Encounter for observation for suspected exposure to other biological agents ruled out: Secondary | ICD-10-CM | POA: Diagnosis not present

## 2019-06-09 DIAGNOSIS — Z21 Asymptomatic human immunodeficiency virus [HIV] infection status: Secondary | ICD-10-CM | POA: Diagnosis not present

## 2019-06-09 DIAGNOSIS — E785 Hyperlipidemia, unspecified: Secondary | ICD-10-CM | POA: Diagnosis not present

## 2019-06-09 HISTORY — PX: AV FISTULA PLACEMENT: SHX1204

## 2019-06-09 LAB — CBC WITH DIFFERENTIAL/PLATELET
Abs Immature Granulocytes: 0.08 10*3/uL — ABNORMAL HIGH (ref 0.00–0.07)
Basophils Absolute: 0.1 10*3/uL (ref 0.0–0.1)
Basophils Relative: 1 %
Eosinophils Absolute: 0.2 10*3/uL (ref 0.0–0.5)
Eosinophils Relative: 3 %
HCT: 37.7 % — ABNORMAL LOW (ref 39.0–52.0)
Hemoglobin: 12.2 g/dL — ABNORMAL LOW (ref 13.0–17.0)
Immature Granulocytes: 1 %
Lymphocytes Relative: 22 %
Lymphs Abs: 1.4 10*3/uL (ref 0.7–4.0)
MCH: 33 pg (ref 26.0–34.0)
MCHC: 32.4 g/dL (ref 30.0–36.0)
MCV: 101.9 fL — ABNORMAL HIGH (ref 80.0–100.0)
Monocytes Absolute: 0.7 10*3/uL (ref 0.1–1.0)
Monocytes Relative: 11 %
Neutro Abs: 4.1 10*3/uL (ref 1.7–7.7)
Neutrophils Relative %: 62 %
Platelets: 186 10*3/uL (ref 150–400)
RBC: 3.7 MIL/uL — ABNORMAL LOW (ref 4.22–5.81)
RDW: 15.2 % (ref 11.5–15.5)
WBC: 6.5 10*3/uL (ref 4.0–10.5)
nRBC: 0 % (ref 0.0–0.2)

## 2019-06-09 LAB — COMPREHENSIVE METABOLIC PANEL
ALT: 17 U/L (ref 0–44)
AST: 14 U/L — ABNORMAL LOW (ref 15–41)
Albumin: 3.6 g/dL (ref 3.5–5.0)
Alkaline Phosphatase: 50 U/L (ref 38–126)
Anion gap: 15 (ref 5–15)
BUN: 48 mg/dL — ABNORMAL HIGH (ref 6–20)
CO2: 25 mmol/L (ref 22–32)
Calcium: 9.2 mg/dL (ref 8.9–10.3)
Chloride: 99 mmol/L (ref 98–111)
Creatinine, Ser: 10.51 mg/dL — ABNORMAL HIGH (ref 0.61–1.24)
GFR calc Af Amer: 6 mL/min — ABNORMAL LOW (ref >60)
GFR calc non Af Amer: 5 mL/min — ABNORMAL LOW (ref >60)
Glucose, Bld: 85 mg/dL (ref 70–99)
Potassium: 4.9 mmol/L (ref 3.5–5.1)
Sodium: 139 mmol/L (ref 135–145)
Total Bilirubin: 0.6 mg/dL (ref 0.3–1.2)
Total Protein: 6.5 g/dL (ref 6.5–8.1)

## 2019-06-09 LAB — SARS CORONAVIRUS 2 BY RT PCR (HOSPITAL ORDER, PERFORMED IN ~~LOC~~ HOSPITAL LAB): SARS Coronavirus 2: NEGATIVE

## 2019-06-09 SURGERY — ARTERIOVENOUS (AV) FISTULA CREATION
Anesthesia: Monitor Anesthesia Care | Site: Arm Lower | Laterality: Right

## 2019-06-09 MED ORDER — FENTANYL CITRATE (PF) 250 MCG/5ML IJ SOLN
INTRAMUSCULAR | Status: AC
  Filled 2019-06-09: qty 5

## 2019-06-09 MED ORDER — OXYCODONE-ACETAMINOPHEN 5-325 MG PO TABS: 1.0000 | ORAL_TABLET | Freq: Four times a day (QID) | ORAL | 0 refills | Status: DC | PRN

## 2019-06-09 MED ORDER — CEFAZOLIN SODIUM-DEXTROSE 2-4 GM/100ML-% IV SOLN
INTRAVENOUS | Status: AC
  Filled 2019-06-09: qty 100

## 2019-06-09 MED ORDER — PROTAMINE SULFATE 10 MG/ML IV SOLN
INTRAVENOUS | Status: AC
  Filled 2019-06-09: qty 5

## 2019-06-09 MED ORDER — MIDAZOLAM HCL 2 MG/2ML IJ SOLN
INTRAMUSCULAR | Status: AC
  Filled 2019-06-09: qty 2

## 2019-06-09 MED ORDER — PROPOFOL 10 MG/ML IV BOLUS
INTRAVENOUS | Status: AC
  Filled 2019-06-09: qty 20

## 2019-06-09 MED ORDER — ONDANSETRON HCL 4 MG/2ML IJ SOLN
INTRAMUSCULAR | Status: AC
  Filled 2019-06-09: qty 2

## 2019-06-09 MED ORDER — LIDOCAINE HCL 1 % IJ SOLN
INTRAMUSCULAR | Status: DC | PRN
  Administered 2019-06-09: 10 mL

## 2019-06-09 MED ORDER — SODIUM CHLORIDE 0.9 % IV SOLN
INTRAVENOUS | Status: DC
  Administered 2019-06-09: 15:00:00 via INTRAVENOUS

## 2019-06-09 MED ORDER — CEFAZOLIN SODIUM-DEXTROSE 2-4 GM/100ML-% IV SOLN
2.0000 g | INTRAVENOUS | Status: AC
  Administered 2019-06-09: 2 g via INTRAVENOUS
  Filled 2019-06-09: qty 100

## 2019-06-09 MED ORDER — FENTANYL CITRATE (PF) 100 MCG/2ML IJ SOLN
INTRAMUSCULAR | Status: DC | PRN
  Administered 2019-06-09 (×2): 25 ug via INTRAVENOUS
  Administered 2019-06-09: 50 ug via INTRAVENOUS

## 2019-06-09 MED ORDER — LIDOCAINE HCL (PF) 1 % IJ SOLN
INTRAMUSCULAR | Status: AC
  Filled 2019-06-09: qty 30

## 2019-06-09 MED ORDER — PROPOFOL 10 MG/ML IV BOLUS
INTRAVENOUS | Status: DC | PRN
  Administered 2019-06-09 (×2): 20 mg via INTRAVENOUS

## 2019-06-09 MED ORDER — HEPARIN SODIUM (PORCINE) 1000 UNIT/ML IJ SOLN
INTRAMUSCULAR | Status: DC | PRN
  Administered 2019-06-09: 8000 [IU] via INTRAVENOUS

## 2019-06-09 MED ORDER — HYDROMORPHONE HCL 1 MG/ML IJ SOLN: 0.2500 mg | INTRAMUSCULAR | Status: DC | PRN

## 2019-06-09 MED ORDER — PHENYLEPHRINE 40 MCG/ML (10ML) SYRINGE FOR IV PUSH (FOR BLOOD PRESSURE SUPPORT)
PREFILLED_SYRINGE | INTRAVENOUS | Status: AC
  Filled 2019-06-09: qty 10

## 2019-06-09 MED ORDER — PHENYLEPHRINE HCL (PRESSORS) 10 MG/ML IV SOLN
INTRAVENOUS | Status: DC | PRN
  Administered 2019-06-09 (×2): 80 ug via INTRAVENOUS

## 2019-06-09 MED ORDER — HEPARIN SODIUM (PORCINE) 1000 UNIT/ML IJ SOLN
INTRAMUSCULAR | Status: AC
  Filled 2019-06-09: qty 1

## 2019-06-09 MED ORDER — SODIUM CHLORIDE 0.9 % IV SOLN
INTRAVENOUS | Status: AC
  Filled 2019-06-09: qty 1.2

## 2019-06-09 MED ORDER — ONDANSETRON HCL 4 MG/2ML IJ SOLN
INTRAMUSCULAR | Status: DC | PRN
  Administered 2019-06-09: 4 mg via INTRAVENOUS

## 2019-06-09 MED ORDER — MIDAZOLAM HCL 5 MG/5ML IJ SOLN
INTRAMUSCULAR | Status: DC | PRN
  Administered 2019-06-09: 1 mg via INTRAVENOUS

## 2019-06-09 MED ORDER — PROPOFOL 500 MG/50ML IV EMUL
INTRAVENOUS | Status: DC | PRN
  Administered 2019-06-09: 100 ug/kg/min via INTRAVENOUS

## 2019-06-09 MED ORDER — PROTAMINE SULFATE 10 MG/ML IV SOLN
INTRAVENOUS | Status: DC | PRN
  Administered 2019-06-09: 40 mg via INTRAVENOUS

## 2019-06-09 SURGICAL SUPPLY — 41 items
ARMBAND PINK RESTRICT EXTREMIT (MISCELLANEOUS) ×6 IMPLANT
BNDG ESMARK 4X9 LF (GAUZE/BANDAGES/DRESSINGS) ×3 IMPLANT
CANISTER SUCT 3000ML PPV (MISCELLANEOUS) ×3 IMPLANT
CLIP VESOCCLUDE MED 6/CT (CLIP) ×3 IMPLANT
CLIP VESOCCLUDE SM WIDE 6/CT (CLIP) ×3 IMPLANT
COVER PROBE W GEL 5X96 (DRAPES) ×3 IMPLANT
COVER WAND RF STERILE (DRAPES) ×3 IMPLANT
CUFF TOURN SGL QUICK 24 (TOURNIQUET CUFF) ×2
CUFF TRNQT CYL 24X4X16.5-23 (TOURNIQUET CUFF) ×1 IMPLANT
DECANTER SPIKE VIAL GLASS SM (MISCELLANEOUS) ×3 IMPLANT
DERMABOND ADVANCED (GAUZE/BANDAGES/DRESSINGS) ×2
DERMABOND ADVANCED .7 DNX12 (GAUZE/BANDAGES/DRESSINGS) ×1 IMPLANT
ELECT REM PT RETURN 9FT ADLT (ELECTROSURGICAL) ×3
ELECTRODE REM PT RTRN 9FT ADLT (ELECTROSURGICAL) ×1 IMPLANT
GLOVE BIO SURGEON STRL SZ7.5 (GLOVE) ×3 IMPLANT
GLOVE BIOGEL PI IND STRL 8 (GLOVE) ×1 IMPLANT
GLOVE BIOGEL PI INDICATOR 8 (GLOVE) ×2
GOWN STRL REUS W/ TWL LRG LVL3 (GOWN DISPOSABLE) ×3 IMPLANT
GOWN STRL REUS W/ TWL XL LVL3 (GOWN DISPOSABLE) ×1 IMPLANT
GOWN STRL REUS W/TWL LRG LVL3 (GOWN DISPOSABLE) ×6
GOWN STRL REUS W/TWL XL LVL3 (GOWN DISPOSABLE) ×2
HEMOSTAT SPONGE AVITENE ULTRA (HEMOSTASIS) IMPLANT
KIT BASIN OR (CUSTOM PROCEDURE TRAY) ×3 IMPLANT
KIT TURNOVER KIT B (KITS) ×3 IMPLANT
NS IRRIG 1000ML POUR BTL (IV SOLUTION) ×3 IMPLANT
PACK CV ACCESS (CUSTOM PROCEDURE TRAY) ×3 IMPLANT
PAD ARMBOARD 7.5X6 YLW CONV (MISCELLANEOUS) ×6 IMPLANT
SUT MNCRL AB 4-0 PS2 18 (SUTURE) ×3 IMPLANT
SUT PROLENE 6 0 BV (SUTURE) ×3 IMPLANT
SUT PROLENE 7 0 BV 1 (SUTURE) IMPLANT
SUT SILK 2 0 (SUTURE) ×2
SUT SILK 2-0 18XBRD TIE 12 (SUTURE) ×1 IMPLANT
SUT SILK 3 0 (SUTURE) ×2
SUT SILK 3-0 18XBRD TIE 12 (SUTURE) ×1 IMPLANT
SUT SILK 4 0 (SUTURE) ×2
SUT SILK 4-0 18XBRD TIE 12 (SUTURE) ×1 IMPLANT
SUT VIC AB 3-0 SH 27 (SUTURE) ×2
SUT VIC AB 3-0 SH 27X BRD (SUTURE) ×1 IMPLANT
TOWEL GREEN STERILE (TOWEL DISPOSABLE) ×3 IMPLANT
UNDERPAD 30X30 (UNDERPADS AND DIAPERS) ×3 IMPLANT
WATER STERILE IRR 1000ML POUR (IV SOLUTION) ×3 IMPLANT

## 2019-06-09 NOTE — Discharge Instructions (Addendum)
[facility]  Discharge Instructions  AV Fistula or Graft Surgery for Dialysis Access  Please refer to the following instructions for your post-procedure care. Your surgeon or physician assistant will discuss any changes with you.  Activity  You may drive the day following your surgery, if you are comfortable and no longer taking prescription pain medication. Resume full activity as the soreness in your incision resolves.  Bathing/Showering  You may shower after you go home. Keep your incision dry for 48 hours. Do not soak in a bathtub, hot tub, or swim until the incision heals completely. You may not shower if you have a hemodialysis catheter.  Incision Care  Clean your incision with mild soap and water after 48 hours. Pat the area dry with a clean towel. You do not need a bandage unless otherwise instructed. Do not apply any ointments or creams to your incision. You may have skin glue on your incision. Do not peel it off. It will come off on its own in about one week. Your arm may swell a bit after surgery. To reduce swelling use pillows to elevate your arm so it is above your heart. Your doctor will tell you if you need to lightly wrap your arm with an ACE bandage.  Diet  Resume your normal diet. There are not special food restrictions following this procedure. In order to heal from your surgery, it is CRITICAL to get adequate nutrition. Your body requires vitamins, minerals, and protein. Vegetables are the best source of vitamins and minerals. Vegetables also provide the perfect balance of protein. Processed food has little nutritional value, so try to avoid this.  Medications  Resume taking all of your medications. If your incision is causing pain, you may take over-the counter pain relievers such as acetaminophen (Tylenol). If you were prescribed a stronger pain medication, please be aware these medications can cause nausea and constipation. Prevent  nausea by taking the medication with a snack or meal. Avoid constipation by drinking plenty of fluids and eating foods with high amount of fiber, such as fruits, vegetables, and grains.  Do not take Tylenol if you are taking prescription pain medications.  Follow up Your surgeon may want to see you in the office following your access surgery. If so, this will be arranged at the time of your surgery.  Please call us immediately for any of the following conditions:  Increased pain, redness, drainage (pus) from your incision site Fever of 101 degrees or higher Severe or worsening pain at your incision site Hand pain or numbness.  Reduce your risk of vascular disease:  Stop smoking. If you would like help, call [facility] at 1-800-QUIT-NOW 641-009-1592) or [place] at [place] your cholesterol Maintain a desired weight Control your diabetes Keep your blood pressure down  Dialysis  It will take several weeks to several months for your new dialysis access to be ready for use. Your surgeon will determine when it is okay to use it. Your nephrologist will continue to direct your dialysis. You can continue to use your Permcath until your new access is ready for use.   06/09/2019 STANLY SI [id number] May 18, 1966  Surgeon(s): Marty Heck, MD  Procedure(s): Right Arm Fistula Plication.   May stick graft on designated area only:  Do NOT stick over incision x 6 weeks. May stick above and below incision now. SEE DIAGRAM   If you have any questions, please call the office at [phone].

## 2019-06-09 NOTE — Anesthesia Procedure Notes (Signed)
Procedure Name: [place] Performed by: Inda Coke, CRNA Pre-anesthesia Checklist: Patient identified, Emergency Drugs available, Suction available, Timeout performed and Patient being monitored Patient Re-evaluated:Patient Re-evaluated prior to induction Oxygen Delivery Method: Simple face mask Induction Type: IV induction Placement Confirmation: ETT inserted through vocal cords under direct vision,  positive ETCO2 and breath sounds checked- equal and bilateral Tube secured with: Tape Dental Injury: Teeth and Oropharynx as per pre-operative assessment

## 2019-06-09 NOTE — ED Triage Notes (Signed)
Pt reports right arm fistula with scab that has fallen off. States was told by his MD to come to ED. No bleeding. Pt states last dialysis yesterday. VSS. NAD at present. Denies recent fever.

## 2019-06-09 NOTE — ED Notes (Signed)
IV team at bedside 

## 2019-06-09 NOTE — ED Provider Notes (Addendum)
[facility] EMERGENCY DEPARTMENT Provider Note   CSN: [id number] Arrival date & time: 06/09/19  0730     History   Chief Complaint Chief Complaint  Patient presents with  . Wound Check    HPI Darrell Johnson is a 53 y.o. adult.     The history is provided by the patient. No language interpreter was used.  Wound Check This is a new problem. The current episode started more than 1 week ago. The problem occurs constantly. The problem has been gradually worsening. Nothing aggravates the symptoms. Nothing relieves the symptoms. He has tried nothing for the symptoms. The treatment provided no relief.   Pt reports he had a scab over graft that came off.  Dialysis nurse advised him to come to the ED for evaluation.  Pt had graft placed in 2015 by Dr. early Past Medical History:  Diagnosis Date  . Anemia   . Anxiety   . Arthritis    "knees" (04/13/2017)  . Asthma   . CHF (congestive heart failure) ([place])   . ESRD (end stage renal disease) on dialysis [facility])    "MWF; [facility]" (04/13/2017)  . Gout, unspecified 08/13/2009   Qualifier: Diagnosis of  By: Redmond Pulling  MD, Mateo Flow    . H1N1 influenza    March 2016  . History of blood transfusion 02/2017   "related to OR"  . HIV infection ([place]) 1980's   on ART therapy since, followed by ID clinic, complicated  by neuropathy  . Hyperlipidemia    hypertrygliceridemia determined ti be secondary to ART therpay  . HYPERTENSION 05/08/2006  . Pneumonia    "once; years ago" (04/13/2017)  . Rib fractures 01/2009  . Seizures ([place])    last seizure was in the 1990s, pt has family history of seizures; "probably related to alcohol" (04/13/2017)  . 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 Syphilis 1997   history of syphilis 1997  . Tobacco abuse     Patient Active Problem List   Diagnosis  Date Noted  . GERD (gastroesophageal reflux disease) 02/14/2019  . Depression 02/14/2019  . Elevated troponin 02/14/2019  . Acute on chronic diastolic (congestive) heart failure ([place]) 02/14/2019  . Pulmonary edema 01/31/2019  . Moderate mitral regurgitation   . Acute respiratory failure with hypoxia ([place]) 03/29/2017  . Trigger finger of right hand 09/11/2016  . Hyperlipidemia 03/31/2016  . Anxiety state 03/11/2016  . Lumbar radiculopathy 01/28/2016  . Seasonal allergies   . Healthcare maintenance 05/03/2015  . Anemia in chronic kidney disease 08/30/2014  . ESRD on dialysis ([place]) 12/27/2013  . History of syphilis 11/07/2013  . Arthritis 09/09/2013  . Tobacco abuse 09/07/2013  . Chronic pain disorder 04/28/2013  . Gout 08/13/2009  . HIV disease ([place]) 05/08/2006  . Essential hypertension 05/08/2006  . SEIZURE DISORDER 05/08/2006    Past Surgical History:  Procedure Laterality Date  . AV FISTULA PLACEMENT Right 12/02/2013   Procedure: ARTERIOVENOUS (AV) FISTULA CREATION;  Surgeon: Rosetta Posner, MD;  Location: [facility];  Service: Vascular;  Laterality: Right;  . INCISION AND DRAINAGE ABSCESS Right 03/09/2017   Procedure: INCISION AND DRAINAGE Right Scrotal Abscess;  Surgeon: Franchot Gallo, MD;  Location: [facility];  Service: Urology;  Laterality: Right;  . THROMBECTOMY Right ~ 2016   "AV fistula clotted off"  Home Medications    Prior to Admission medications   Medication Sig Start Date End Date Taking? Authorizing Provider  albuterol (PROVENTIL HFA;VENTOLIN HFA) 108 (90 Base) MCG/ACT inhaler Inhale 2 puffs into the lungs every 4 (four) hours as needed for up to 30 days for wheezing or shortness of breath. 09/21/18 02/14/19  Kerin Perna, NP  amitriptyline (ELAVIL) 25 MG tablet TAKE 1 TABLET BY MOUTH EVERYDAY AT BEDTIME Patient not taking: No sig reported 02/10/19   Carlyle Basques, MD  amLODipine (NORVASC) 10 MG tablet Take 1 tablet (10 mg total) by mouth daily. 09/21/18    Kerin Perna, NP  azelastine (OPTIVAR) 0.05 % ophthalmic solution Place 1 drop into both eyes daily.  12/24/18   [provider]  azithromycin (ZITHROMAX) 250 MG tablet Take 1 tablet (250 mg total) by mouth daily. Take first 2 tablets together, then 1 every day until finished. 02/20/19   Sherwood Gambler, MD  budesonide-formoterol [facility]) 160-4.5 MCG/ACT inhaler Inhale 2 puffs into the lungs 2 (two) times daily. 09/21/18   Kerin Perna, NP  buPROPion (WELLBUTRIN SR) 100 MG 12 hr tablet TAKE 1 TABLET BY MOUTH TWICE A DAY Patient not taking: No sig reported 02/10/19   Carlyle Basques, MD  carvedilol (COREG) 25 MG tablet Take 1 tablet (25 mg total) by mouth 2 (two) times daily with a meal. 09/21/18   Kerin Perna, NP  Dolutegravir-Rilpivirine (JULUCA) 50-25 MG TABS Take 1 tablet by mouth daily with supper. 03/15/19   Carlyle Basques, MD  doxazosin (CARDURA) 8 MG tablet Take 1 tablet (8 mg total) by mouth at bedtime. 09/21/18   Kerin Perna, NP  ferric citrate (AURYXIA) 1 GM 210 MG(Fe) tablet Take 420 mg by mouth 3 (three) times daily with meals.    [provider]  fluticasone (FLONASE) 50 MCG/ACT nasal spray Place 1 spray into both nostrils as needed for allergies.  09/23/18   [provider]  hydrALAZINE (APRESOLINE) 100 MG tablet Take 100 mg by mouth 3 (three) times daily.  12/01/18   [provider]  ipratropium-albuterol (DUONEB) 0.5-2.5 (3) MG/3ML SOLN Take 3 mLs by nebulization every 6 (six) hours as needed. 09/21/18   Kerin Perna, NP  lisinopril (ZESTRIL) 10 MG tablet Take 10 mg by mouth daily. 09/06/18   [provider]  montelukast (SINGULAIR) 10 MG tablet Take 10 mg by mouth at bedtime.  11/16/18   [provider]  NICOTROL 10 MG inhaler Inhale 1 continuous puffing into the lungs as needed for smoking cessation.  01/18/19   [provider]  pravastatin (PRAVACHOL) 40 MG tablet Take 1 tablet (40 mg total) by  mouth daily. 06/03/19   Kerin Perna, NP  predniSONE (DELTASONE) 20 MG tablet TAKE 2 TABS X 3DAYS, THEN 1 AND 1/2 TABS X 3 DAYS, THEN 1 TAB X 3 DAYS,THEN 1/2TABX 3 DAYS,THEN STOP 02/15/19   [provider]  sucroferric oxyhydroxide (VELPHORO) 500 MG chewable tablet Chew 500 mg by mouth 3 (three) times daily with meals.    [provider]  zidovudine (RETROVIR) 300 MG tablet Take 1 tablet (300 mg total) by mouth daily. 03/15/19   Carlyle Basques, MD    Family History Family History  Problem Relation Age of Onset  . Cancer Mother   . Hypertension Mother   . COPD Father   . Hypertension Father   . Diabetes Sister   . Hypertension Sister   . Diabetes Brother   . Hypertension Brother   .  Stroke Neg Hx     Social History Social History   Tobacco Use  . Smoking status: Former Smoker    Packs/day: 0.10    Years: 25.00    Pack years: 2.50    Types: Cigarettes    Quit date: 12/20/2018    Years since quitting: 0.4  . Smokeless tobacco: Never Used  . Tobacco comment: "quitting"  Substance Use Topics  . Alcohol use: No    Alcohol/week: 0.0 standard drinks    Comment: 04/13/2017 "quit ~ 2014"  . Drug use: No     Allergies   Patient has no known allergies.   Review of Systems Review of Systems  Skin: Positive for wound.  All other systems reviewed and are negative.    Physical Exam Updated Vital Signs BP (!) 164/83   Pulse 78   Temp 97.9 F (36.6 C) (Oral)   Resp 16   SpO2 98%   Physical Exam Vitals signs reviewed.  Cardiovascular:     Rate and Rhythm: Normal rate.     Pulses: Normal pulses.  Pulmonary:     Effort: Pulmonary effort is normal.  Musculoskeletal:        General: Tenderness present.     Comments: 2 ulcers right forearm over dialysis graft   Skin:    General: Skin is warm.  Neurological:     General: No focal deficit present.     Mental Status: He is alert.  Psychiatric:        Mood and Affect: Mood normal.      ED  Treatments / Results  Labs (all labs ordered are listed, but only abnormal results are displayed) Labs Reviewed - No data to display  EKG None  Radiology No results found.  Procedures Procedures (including critical care time)  Medications Ordered in ED Medications - No data to display   Initial Impression / Assessment and Plan / ED Course  I have reviewed the triage vital signs and the nursing notes.  Pertinent labs & imaging results that were available during my care of the patient were reviewed by me and considered in my medical decision making (see chart for details).            Consult to Vascular on call.  Dr. Carlis Abbott evaluated pt here and will take to OR to repair.   Final Clinical Impressions(s) / ED Diagnoses   Final diagnoses:  Complication of vascular access for dialysis, initial encounter    ED Discharge Orders    None       Sidney Ace 06/09/19 [place], PA-C 06/09/19 0932    Fransico Meadow, PA-C 06/09/19 6599    Maudie Flakes, MD 06/10/19 [phone]

## 2019-06-09 NOTE — Transfer of Care (Signed)
Immediate Anesthesia Transfer of Care Note  Patient: Darrell Johnson  Procedure(s) Performed: Right Arm Fistula Plication. (Right Arm Lower)  Patient Location: PACU  Anesthesia Type:MAC  Level of Consciousness: awake and alert   Airway & Oxygen Therapy: Patient Spontanous Breathing  Post-op Assessment: Report given to RN and Post -op Vital signs reviewed and stable  Post vital signs: Reviewed, stable  Last Vitals:  Vitals Value Taken Time  BP 142/87 06/09/19 1637  Temp    Pulse 79 06/09/19 1638  Resp 14 06/09/19 1638  SpO2 100 % 06/09/19 1638  Vitals shown include unvalidated device data.  Last Pain:  Vitals:   06/09/19 1333  TempSrc:   PainSc: 0-No pain         Complications: No apparent anesthesia complications

## 2019-06-09 NOTE — Anesthesia Postprocedure Evaluation (Signed)
Anesthesia Post Note  Patient: Darrell Johnson  Procedure(s) Performed: Right Arm Fistula Plication. (Right Arm Lower)     Patient location during evaluation: PACU Anesthesia Type: MAC Level of consciousness: awake and alert Pain management: pain level controlled Vital Signs Assessment: post-procedure vital signs reviewed and stable Respiratory status: spontaneous breathing, nonlabored ventilation and respiratory function stable Cardiovascular status: stable and blood pressure returned to baseline Postop Assessment: no apparent nausea or vomiting Anesthetic complications: no    Last Vitals:  Vitals:   06/09/19 1637 06/09/19 1652  BP: (!) 142/87 (!) 143/90  Pulse: 78 75  Resp: 11 16  Temp: 36.6 C   SpO2: 98% 100%    Last Pain:  Vitals:   06/09/19 1637  TempSrc:   PainSc: 0-No pain                 Ilisa Hayworth,W. EDMOND

## 2019-06-09 NOTE — Consult Note (Addendum)
Hospital Consult    Reason for Consult:  Scab off fistula Requesting Physician:  ED MRN #:  [id number]  History of Present Illness: This is a 53 y.o. adult with ESRD who underwent right radial cephalic AVF creation by Dr. Donnetta Hutching on 12/02/13.  He comes into the ER today stating that a scab has fallen off his fistula and he was told to come to the ER.  He dialyzes on M/W/F at the [place] location.  He has not had any bleeding episodes from the fistula.   The pt is on a statin for cholesterol management.  The pt is not on a daily aspirin.   Other AC:  none The pt is on CCB, BB for hypertension.   The pt is not diabetic.   Tobacco hx:  Remote-quit earlier this year.  Past Medical History:  Diagnosis Date  . Anemia   . Anxiety   . Arthritis    "knees" (04/13/2017)  . Asthma   . CHF (congestive heart failure) ([place])   . ESRD (end stage renal disease) on dialysis [facility])    "MWF; [facility]" (04/13/2017)  . Gout, unspecified 08/13/2009   Qualifier: Diagnosis of  By: Redmond Pulling  MD, Mateo Flow    . H1N1 influenza    March 2016  . History of blood transfusion 02/2017   "related to OR"  . HIV infection ([place]) 1980's   on ART therapy since, followed by ID clinic, complicated  by neuropathy  . Hyperlipidemia    hypertrygliceridemia determined ti be secondary to ART therpay  . HYPERTENSION 05/08/2006  . Pneumonia    "once; years ago" (04/13/2017)  . Rib fractures 01/2009  . Seizures ([place])    last seizure was in the 1990s, pt has family history of seizures; "probably related to alcohol" (04/13/2017)  . 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 Syphilis 1997   history of syphilis 1997  . Tobacco abuse     Past Surgical History:  Procedure Laterality Date  . AV FISTULA PLACEMENT Right 12/02/2013   Procedure: ARTERIOVENOUS (AV) FISTULA CREATION;  Surgeon: Rosetta Posner, MD;  Location: [facility];  Service: Vascular;  Laterality: Right;  . INCISION AND DRAINAGE ABSCESS Right 03/09/2017   Procedure: INCISION AND DRAINAGE Right Scrotal Abscess;  Surgeon: Franchot Gallo, MD;  Location: [facility];  Service: Urology;  Laterality: Right;  . THROMBECTOMY Right ~ 2016   "AV fistula clotted off"    No Known Allergies  Prior to Admission medications   Medication Sig Start Date End Date Taking? Authorizing Provider  albuterol (PROVENTIL HFA;VENTOLIN HFA) 108 (90 Base) MCG/ACT inhaler Inhale 2 puffs into the lungs every 4 (four) hours as needed for up to 30 days for wheezing or shortness of breath. 09/21/18 02/14/19  Kerin Perna, NP  amitriptyline (ELAVIL) 25 MG tablet TAKE 1 TABLET BY MOUTH EVERYDAY AT BEDTIME Patient not taking: No sig reported 02/10/19   Carlyle Basques, MD  amLODipine (NORVASC) 10 MG tablet Take 1 tablet (10 mg total) by mouth daily. 09/21/18   Kerin Perna, NP  azelastine (OPTIVAR) 0.05 % ophthalmic solution Place 1 drop into both eyes daily.  12/24/18   [provider]  azithromycin (ZITHROMAX) 250 MG tablet Take 1 tablet (250 mg total) by mouth daily. Take first 2 tablets together, then 1 every day until finished. 02/20/19  Sherwood Gambler, MD  budesonide-formoterol [facility]) 160-4.5 MCG/ACT inhaler Inhale 2 puffs into the lungs 2 (two) times daily. 09/21/18   Kerin Perna, NP  buPROPion (WELLBUTRIN SR) 100 MG 12 hr tablet TAKE 1 TABLET BY MOUTH TWICE A DAY Patient not taking: No sig reported 02/10/19   Carlyle Basques, MD  carvedilol (COREG) 25 MG tablet Take 1 tablet (25 mg total) by mouth 2 (two) times daily with a meal. 09/21/18   Kerin Perna, NP  Dolutegravir-Rilpivirine (JULUCA) 50-25 MG TABS Take 1 tablet by mouth daily with supper. 03/15/19   Carlyle Basques, MD  doxazosin (CARDURA) 8 MG tablet Take 1 tablet (8 mg total) by mouth at bedtime. 09/21/18   Kerin Perna, NP  ferric citrate (AURYXIA) 1 GM 210  MG(Fe) tablet Take 420 mg by mouth 3 (three) times daily with meals.    [provider]  fluticasone (FLONASE) 50 MCG/ACT nasal spray Place 1 spray into both nostrils as needed for allergies.  09/23/18   [provider]  hydrALAZINE (APRESOLINE) 100 MG tablet Take 100 mg by mouth 3 (three) times daily.  12/01/18   [provider]  ipratropium-albuterol (DUONEB) 0.5-2.5 (3) MG/3ML SOLN Take 3 mLs by nebulization every 6 (six) hours as needed. 09/21/18   Kerin Perna, NP  lisinopril (ZESTRIL) 10 MG tablet Take 10 mg by mouth daily. 09/06/18   [provider]  montelukast (SINGULAIR) 10 MG tablet Take 10 mg by mouth at bedtime.  11/16/18   [provider]  NICOTROL 10 MG inhaler Inhale 1 continuous puffing into the lungs as needed for smoking cessation.  01/18/19   [provider]  pravastatin (PRAVACHOL) 40 MG tablet Take 1 tablet (40 mg total) by mouth daily. 06/03/19   Kerin Perna, NP  predniSONE (DELTASONE) 20 MG tablet TAKE 2 TABS X 3DAYS, THEN 1 AND 1/2 TABS X 3 DAYS, THEN 1 TAB X 3 DAYS,THEN 1/2TABX 3 DAYS,THEN STOP 02/15/19   [provider]  sucroferric oxyhydroxide (VELPHORO) 500 MG chewable tablet Chew 500 mg by mouth 3 (three) times daily with meals.    [provider]  zidovudine (RETROVIR) 300 MG tablet Take 1 tablet (300 mg total) by mouth daily. 03/15/19   Carlyle Basques, MD    Social History   Socioeconomic History  . Marital status: Married    Spouse name: Not on file  . Number of children: Not on file  . Years of education: Not on file  . Highest education level: Not on file  Occupational History  . Occupation: details cars  Social Needs  . Financial resource strain: Not on file  . Food insecurity    Worry: Not on file    Inability: Not on file  . Transportation needs    Medical: Not on file    Non-medical: Not on file  Tobacco Use  . Smoking status: Former Smoker    Packs/day: 0.10     Years: 25.00    Pack years: 2.50    Types: Cigarettes    Quit date: 12/20/2018    Years since quitting: 0.4  . Smokeless tobacco: Never Used  . Tobacco comment: "quitting"  Substance and Sexual Activity  . Alcohol use: No    Alcohol/week: 0.0 standard drinks    Comment: 04/13/2017 "quit ~ 2014"  . Drug use: No  . Sexual activity: Never    Partners: Female    Comment: given condoms  Lifestyle  . Physical activity  Days per week: Not on file    Minutes per session: Not on file  . Stress: Not on file  Relationships  . Social Herbalist on phone: Not on file    Gets together: Not on file    Attends religious service: Not on file    Active member of club or organization: Not on file    Attends meetings of clubs or organizations: Not on file    Relationship status: Not on file  . Intimate partner violence    Fear of current or ex partner: Not on file    Emotionally abused: Not on file    Physically abused: Not on file    Forced sexual activity: Not on file  Other Topics Concern  . Not on file  Social History Narrative   ** Merged History Encounter **         Family History  Problem Relation Age of Onset  . Cancer Mother   . Hypertension Mother   . COPD Father   . Hypertension Father   . Diabetes Sister   . Hypertension Sister   . Diabetes Brother   . Hypertension Brother   . Stroke Neg Hx     ROS: [x]  Positive   [ ]  Negative   [ ]  All sytems reviewed and are negative  Cardiac: [x]  high blood pressure  Vascular: [x]  ulcer on fistula   Pulmonary: [x]  asthma/wheezing  Neurologic: [x]  hx of seizures   Hematologic: [x]  HIV  Endocrine:   []  diabetes []  thyroid disease  GI []  vomiting blood  GU: [x]  CKD/renal failure [x]  HD--[x]  M/W/F or []  T/T/S  Psychiatric: [x]  anxiety  Musculoskeletal: [x]  arthritis  Integumentary:  [x]  ulcer on fistula  Constitutional: []  fever []  chills   Physical Examination  Vitals:   06/09/19 0745  06/09/19 0746  BP: (!) 164/83   Pulse:  78  Resp:    Temp:    SpO2:  98%   There is no height or weight on file to calculate BMI.  General:  WDWN in NAD Gait: Not observed HENT: WNL, normocephalic Pulmonary: normal non-labored breathing, without Rales, rhonchi,  wheezing Cardiac: regular, without  Murmurs, rubs or gallops; without carotid bruits Skin: without rashes Vascular Exam/Pulses:    Extremities: without ischemic changes, without Gangrene , without cellulitis; without open wounds;  Musculoskeletal: no muscle wasting or atrophy  Neurologic: A&O X 3;  No focal weakness or paresthesias are detected; speech is fluent/normal Psychiatric:  The pt has Normal affect.  CBC    Component Value Date/Time   WBC 9.0 02/20/2019 1403   RBC 2.72 (L) 02/20/2019 1403   HGB 10.2 (L) 02/20/2019 1403   HCT 29.4 (L) 02/20/2019 1403   PLT 203 02/20/2019 1403   MCV 108.1 (H) 02/20/2019 1403   MCH 37.5 (H) 02/20/2019 1403   MCHC 34.7 02/20/2019 1403   RDW 16.2 (H) 02/20/2019 1403   LYMPHSABS 0.7 02/14/2019 0048   MONOABS 0.7 02/14/2019 0048   EOSABS 0.2 02/14/2019 0048   EOSABS 0.2 K/UL 05/26/2006 1229   BASOSABS 0.0 02/14/2019 0048    BMET    Component Value Date/Time   NA 138 02/20/2019 1403   K 4.6 02/20/2019 1403   CL 97 (L) 02/20/2019 1403   CO2 22 02/20/2019 1403   GLUCOSE 112 (H) 02/20/2019 1403   BUN 83 (H) 02/20/2019 1403   CREATININE 12.54 (H) 02/20/2019 1403   CREATININE 7.18 (H) 01/06/2019 1439   CALCIUM 9.3 02/20/2019  [place] (L) 02/20/2019 1403   GFRNONAA 8 (L) 01/06/2019 1439   GFRAA 5 (L) 02/20/2019 1403   GFRAA 9 (L) 01/06/2019 1439    COAGS: No results found for: INR, PROTIME   Non-Invasive Vascular Imaging:   none   ASSESSMENT/PLAN: This is a 53 y.o. adult with ESRD who presents to ED today with ulcer on right arm fistula that is at high risk for bleeding  -will need rapid covid test and will plan for OR later today for plication.  Most  likely will not need tunneled dialysis catheter, but did discuss with pt that if there is not enough room to stick the fistula after revision, he would need catheter, but most likely will have room to stick after revision.  -he last had something to drink at 0500 this morning.  -npo now   Leontine Locket, PA-C [facility] [phone]  I have seen and evaluated the patient. I agree with the PA note as documented above.  53 year old male with end-stage renal disease and right arm radiocephalic fistula placed in 2015 by Dr. Therese Sarah.  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  We have recommended rapid Covid test and to remain n.p.o.  We will plan plication in the OR later today.  Hopefully he will have area to access the fistula above and below the revision and does not need a catheter.  Marty Heck, MD [facility] [place] Office: [phone] Pager: [place]

## 2019-06-09 NOTE — Op Note (Signed)
Date: June 09, 2019  Preoperative diagnosis: Ulceration over right radiocephalic AV fistula  Postoperative diagnosis: Same  Procedure: 1.  Right arm AV fistula revision with resection of ulcer and aneurysm plication  Surgeon: Dr. Marty Heck, MD  Assistant: Roxy Horseman, PA and Leontine Locket, [place]  Indication: Patient is a 53 year old male with end-stage renal disease that presented to the ER this morning with ulcerated area over his right radiocephalic AV fistula.  He presents for ulcer resection and an aneurysm plication after risk benefits were discussed.  Findings: Tourniquet was placed on the upper arm after it was exsanguinated and the patient was given 8000 units of IV heparin.  Ultimately elliptical incision was made and the ulcer was resected.  The underlying fistula was plicated.  Still has a great thrill.  Anesthesia: MAC  Details: Patient was taken to the operating room after informed consent was obtained.  Placed on the operative table in supine position the right arm was placed on an armboard.  After anesthesia was induced his right arm was prepped draped usual sterile fashion.  Patient was given 8000 units IV heparin.  I used an Geographical information systems officer and exsanguinated the arm and placed a tourniquet on the upper arm that was inflated to 250 mm Hg.  Ultimately a elliptical incision was made over the ulcerated area over the fistula.  Dissected out the fistula with Bovie cautery and circumferentially mobilized the fistula underneath this.  Ultimately got Vesseloops proximally distally.  The tourniquet was then deflated after 10 minutes.  Used metzenbaum scissors and resected the ulcerated skin off anterior wall of the fistula.  There was an underlying aneurysm in the fistula wall and a thinned segment of the anterior wall that was bleeding where there was erosion into the fistula itself.  All of this was resected.  I oversewed this reapproximating anterior wall of the fistula  with 5-0 Prolene and reinforced with a 6-0 Prolene.  Hemostasis was achieved patient was given protamine for reversal.  I closed the subcutaneous tissue with running 3-0 Vicryl, skin closed with 4-0 Monocryl and Dermabond applied.  Complication: None  Condition: Stable  Marty Heck, MD [facility] [place] Office: [phone] Pager: [place]

## 2019-06-09 NOTE — Anesthesia Preprocedure Evaluation (Addendum)
Anesthesia Evaluation  Patient identified by MRN, date of birth, ID band Patient awake    Reviewed: Allergy & Precautions, H&P , NPO status , Patient's Chart, lab work & pertinent test results, reviewed documented beta blocker date and time   Airway Mallampati: II  TM Distance: >3 FB Neck ROM: Full    Dental no notable dental hx. (+) Teeth Intact, Dental Advisory Given   Pulmonary asthma , former smoker,    Pulmonary exam normal breath sounds clear to auscultation       Cardiovascular hypertension, Pt. on medications and Pt. on home beta blockers +CHF   Rhythm:Regular Rate:Normal     Neuro/Psych Anxiety Depression negative neurological ROS     GI/Hepatic Neg liver ROS, GERD  ,  Endo/Other  negative endocrine ROS  Renal/GU ESRF and DialysisRenal disease  negative genitourinary   Musculoskeletal  (+) Arthritis , Osteoarthritis,    Abdominal   Peds  Hematology  (+) Blood dyscrasia, anemia , HIV,   Anesthesia Other Findings   Reproductive/Obstetrics negative OB ROS                            Anesthesia Physical Anesthesia Plan  ASA: III  Anesthesia Plan: MAC   Post-op Pain Management:    Induction: Intravenous  PONV Risk Score and Plan: 2 and Propofol infusion, Midazolam and Ondansetron  Airway Management Planned: Simple Face Mask  Additional Equipment:   Intra-op Plan:   Post-operative Plan:   Informed Consent: I have reviewed the patients History and Physical, chart, labs and discussed the procedure including the risks, benefits and alternatives for the proposed anesthesia with the patient or authorized representative who has indicated his/her understanding and acceptance.     Dental advisory given  Plan Discussed with: CRNA  Anesthesia Plan Comments:         Anesthesia Quick Evaluation

## 2019-06-10 ENCOUNTER — Encounter (HOSPITAL_COMMUNITY): Payer: Self-pay | Admitting: Vascular Surgery

## 2019-06-10 ENCOUNTER — Telehealth: Payer: Self-pay | Admitting: Nephrology

## 2019-06-10 DIAGNOSIS — E8770 Fluid overload, unspecified: Secondary | ICD-10-CM | POA: Diagnosis not present

## 2019-06-10 DIAGNOSIS — D509 Iron deficiency anemia, unspecified: Secondary | ICD-10-CM | POA: Diagnosis not present

## 2019-06-10 DIAGNOSIS — N186 End stage renal disease: Secondary | ICD-10-CM | POA: Diagnosis not present

## 2019-06-10 DIAGNOSIS — D631 Anemia in chronic kidney disease: Secondary | ICD-10-CM | POA: Diagnosis not present

## 2019-06-10 DIAGNOSIS — Z992 Dependence on renal dialysis: Secondary | ICD-10-CM | POA: Diagnosis not present

## 2019-06-10 DIAGNOSIS — N2581 Secondary hyperparathyroidism of renal origin: Secondary | ICD-10-CM | POA: Diagnosis not present

## 2019-06-10 NOTE — Telephone Encounter (Signed)
Transition of care contact from inpatient facility  Date of Discharge: 06/09/19 Date of Contact: 06/10/19 Method of Contact: Phone Spoke to patient.  Patient contacted to discuss transition of care from recent inpatient hospitalization. Patient was admitted to [facility] on 06/09/19 with Dx dialysis access hemorrhage/ulceration.  He underwent plication on 09/82/86 without issues. Was able to be dialyzed today without issues. C/o soreness, but tolerable.  Medication list reviewed. He will f/u with his outpatient HD unit on: MWF No other needs noted at this time.  Veneta Penton, PA-C [employer] Pager [phone]

## 2019-06-12 DIAGNOSIS — D631 Anemia in chronic kidney disease: Secondary | ICD-10-CM | POA: Diagnosis not present

## 2019-06-12 DIAGNOSIS — N186 End stage renal disease: Secondary | ICD-10-CM | POA: Diagnosis not present

## 2019-06-12 DIAGNOSIS — Z992 Dependence on renal dialysis: Secondary | ICD-10-CM | POA: Diagnosis not present

## 2019-06-12 DIAGNOSIS — N2581 Secondary hyperparathyroidism of renal origin: Secondary | ICD-10-CM | POA: Diagnosis not present

## 2019-06-12 DIAGNOSIS — D509 Iron deficiency anemia, unspecified: Secondary | ICD-10-CM | POA: Diagnosis not present

## 2019-06-12 DIAGNOSIS — E8770 Fluid overload, unspecified: Secondary | ICD-10-CM | POA: Diagnosis not present

## 2019-06-14 DIAGNOSIS — N186 End stage renal disease: Secondary | ICD-10-CM | POA: Diagnosis not present

## 2019-06-14 DIAGNOSIS — D631 Anemia in chronic kidney disease: Secondary | ICD-10-CM | POA: Diagnosis not present

## 2019-06-14 DIAGNOSIS — E8770 Fluid overload, unspecified: Secondary | ICD-10-CM | POA: Diagnosis not present

## 2019-06-14 DIAGNOSIS — N2581 Secondary hyperparathyroidism of renal origin: Secondary | ICD-10-CM | POA: Diagnosis not present

## 2019-06-14 DIAGNOSIS — D509 Iron deficiency anemia, unspecified: Secondary | ICD-10-CM | POA: Diagnosis not present

## 2019-06-14 DIAGNOSIS — Z992 Dependence on renal dialysis: Secondary | ICD-10-CM | POA: Diagnosis not present

## 2019-06-15 DIAGNOSIS — Z992 Dependence on renal dialysis: Secondary | ICD-10-CM | POA: Diagnosis not present

## 2019-06-15 DIAGNOSIS — T82898A Other specified complication of vascular prosthetic devices, implants and grafts, initial encounter: Secondary | ICD-10-CM | POA: Diagnosis not present

## 2019-06-15 DIAGNOSIS — N186 End stage renal disease: Secondary | ICD-10-CM | POA: Diagnosis not present

## 2019-06-17 DIAGNOSIS — N186 End stage renal disease: Secondary | ICD-10-CM | POA: Diagnosis not present

## 2019-06-17 DIAGNOSIS — D631 Anemia in chronic kidney disease: Secondary | ICD-10-CM | POA: Diagnosis not present

## 2019-06-17 DIAGNOSIS — Z992 Dependence on renal dialysis: Secondary | ICD-10-CM | POA: Diagnosis not present

## 2019-06-17 DIAGNOSIS — N2581 Secondary hyperparathyroidism of renal origin: Secondary | ICD-10-CM | POA: Diagnosis not present

## 2019-06-17 DIAGNOSIS — E8770 Fluid overload, unspecified: Secondary | ICD-10-CM | POA: Diagnosis not present

## 2019-06-17 DIAGNOSIS — D509 Iron deficiency anemia, unspecified: Secondary | ICD-10-CM | POA: Diagnosis not present

## 2019-06-18 DIAGNOSIS — Z992 Dependence on renal dialysis: Secondary | ICD-10-CM | POA: Diagnosis not present

## 2019-06-18 DIAGNOSIS — E8770 Fluid overload, unspecified: Secondary | ICD-10-CM | POA: Diagnosis not present

## 2019-06-18 DIAGNOSIS — N186 End stage renal disease: Secondary | ICD-10-CM | POA: Diagnosis not present

## 2019-06-18 DIAGNOSIS — D509 Iron deficiency anemia, unspecified: Secondary | ICD-10-CM | POA: Diagnosis not present

## 2019-06-18 DIAGNOSIS — D631 Anemia in chronic kidney disease: Secondary | ICD-10-CM | POA: Diagnosis not present

## 2019-06-18 DIAGNOSIS — N2581 Secondary hyperparathyroidism of renal origin: Secondary | ICD-10-CM | POA: Diagnosis not present

## 2019-06-20 DIAGNOSIS — D509 Iron deficiency anemia, unspecified: Secondary | ICD-10-CM | POA: Diagnosis not present

## 2019-06-20 DIAGNOSIS — N2581 Secondary hyperparathyroidism of renal origin: Secondary | ICD-10-CM | POA: Diagnosis not present

## 2019-06-20 DIAGNOSIS — D631 Anemia in chronic kidney disease: Secondary | ICD-10-CM | POA: Diagnosis not present

## 2019-06-20 DIAGNOSIS — Z992 Dependence on renal dialysis: Secondary | ICD-10-CM | POA: Diagnosis not present

## 2019-06-20 DIAGNOSIS — E8770 Fluid overload, unspecified: Secondary | ICD-10-CM | POA: Diagnosis not present

## 2019-06-20 DIAGNOSIS — N186 End stage renal disease: Secondary | ICD-10-CM | POA: Diagnosis not present

## 2019-06-30 MED FILL — JULUCA 50-25 MG TAB: 50-25 | 30 days supply | Qty: 30 | Fill #4

## 2019-06-30 MED FILL — ZIDOVUDINE 300 MG TABLET: 300 | 30 days supply | Qty: 30 | Fill #4

## 2019-07-01 ENCOUNTER — Other Ambulatory Visit (INDEPENDENT_AMBULATORY_CARE_PROVIDER_SITE_OTHER): Payer: Self-pay | Admitting: Primary Care

## 2019-07-04 NOTE — Telephone Encounter (Signed)
Sent to PCP ?

## 2019-07-26 ENCOUNTER — Ambulatory Visit: Payer: Medicare Other

## 2019-07-28 MED FILL — ZIDOVUDINE 300 MG TABLET: 300 | 30 days supply | Qty: 30 | Fill #5

## 2019-07-28 MED FILL — JULUCA 50-25 MG TAB: 50-25 | 30 days supply | Qty: 30 | Fill #5

## 2019-08-08 ENCOUNTER — Other Ambulatory Visit (INDEPENDENT_AMBULATORY_CARE_PROVIDER_SITE_OTHER): Payer: Self-pay | Admitting: Primary Care

## 2019-08-22 ENCOUNTER — Other Ambulatory Visit: Payer: Self-pay | Admitting: Internal Medicine

## 2019-08-22 DIAGNOSIS — B20 Human immunodeficiency virus [HIV] disease: Secondary | ICD-10-CM

## 2019-08-23 ENCOUNTER — Ambulatory Visit: Payer: Medicare Other

## 2019-08-23 ENCOUNTER — Other Ambulatory Visit: Payer: Self-pay

## 2019-08-23 ENCOUNTER — Other Ambulatory Visit: Payer: Medicare Other

## 2019-08-23 DIAGNOSIS — Z79899 Other long term (current) drug therapy: Secondary | ICD-10-CM

## 2019-08-23 DIAGNOSIS — B20 Human immunodeficiency virus [HIV] disease: Secondary | ICD-10-CM

## 2019-08-24 ENCOUNTER — Telehealth: Payer: Self-pay

## 2019-08-24 ENCOUNTER — Encounter: Payer: Self-pay | Admitting: Surgery

## 2019-08-24 LAB — T-HELPER CELL (CD4) - (RCID CLINIC ONLY)
CD4 % Helper T Cell: 38 % (ref 33–65)
CD4 T Cell Abs: 637 /uL (ref 400–1790)

## 2019-08-24 NOTE — Telephone Encounter (Signed)
Received call from [place] with critical lab value Cr-12.25  Routing to provider to make aware. Darrell Johnson

## 2019-08-25 LAB — CBC WITH DIFFERENTIAL/PLATELET
Absolute Monocytes: 639 cells/uL (ref 200–950)
Basophils Absolute: 50 cells/uL (ref 0–200)
Basophils Relative: 0.8 %
Eosinophils Absolute: 217 cells/uL (ref 15–500)
Eosinophils Relative: 3.5 %
HCT: 31.2 % — ABNORMAL LOW (ref 38.5–50.0)
Hemoglobin: 10.8 g/dL — ABNORMAL LOW (ref 13.2–17.1)
Lymphs Abs: 1513 cells/uL (ref 850–3900)
MCH: 34 pg — ABNORMAL HIGH (ref 27.0–33.0)
MCHC: 34.6 g/dL (ref 32.0–36.0)
MCV: 98.1 fL (ref 80.0–100.0)
MPV: 10.8 fL (ref 7.5–12.5)
Monocytes Relative: 10.3 %
Neutro Abs: 3782 cells/uL (ref 1500–7800)
Neutrophils Relative %: 61 %
Platelets: 219 10*3/uL (ref 140–400)
RBC: 3.18 10*6/uL — ABNORMAL LOW (ref 4.20–5.80)
RDW: 15.1 % — ABNORMAL HIGH (ref 11.0–15.0)
Total Lymphocyte: 24.4 %
WBC: 6.2 10*3/uL (ref 3.8–10.8)

## 2019-08-25 LAB — COMPLETE METABOLIC PANEL WITH GFR
AG Ratio: 1.6 (calc) (ref 1.0–2.5)
ALT: 8 U/L — ABNORMAL LOW (ref 9–46)
AST: 14 U/L (ref 10–35)
Albumin: 4.3 g/dL (ref 3.6–5.1)
Alkaline phosphatase (APISO): 67 U/L (ref 35–144)
BUN/Creatinine Ratio: 4 (calc) — ABNORMAL LOW (ref 6–22)
BUN: 53 mg/dL — ABNORMAL HIGH (ref 7–25)
CO2: 27 mmol/L (ref 20–32)
Calcium: 9.9 mg/dL (ref 8.6–10.3)
Chloride: 94 mmol/L — ABNORMAL LOW (ref 98–110)
Creat: 12.25 mg/dL — ABNORMAL HIGH (ref 0.70–1.33)
GFR, Est African American: 5 mL/min/{1.73_m2} — ABNORMAL LOW (ref >=60)
GFR, Est Non African American: 4 mL/min/{1.73_m2} — ABNORMAL LOW (ref >=60)
Globulin: 2.7 g/dL (calc) (ref 1.9–3.7)
Glucose, Bld: 74 mg/dL (ref 65–99)
Potassium: 4.9 mmol/L (ref 3.5–5.3)
Sodium: 136 mmol/L (ref 135–146)
Total Bilirubin: 0.4 mg/dL (ref 0.2–1.2)
Total Protein: 7 g/dL (ref 6.1–8.1)

## 2019-08-25 LAB — LIPID PANEL
Cholesterol: 152 mg/dL
HDL: 28 mg/dL — ABNORMAL LOW
LDL Cholesterol (Calc): 90 mg/dL
Non-HDL Cholesterol (Calc): 124 mg/dL
Total CHOL/HDL Ratio: 5.4 (calc) — ABNORMAL HIGH
Triglycerides: 262 mg/dL — ABNORMAL HIGH

## 2019-08-25 LAB — HIV-1 RNA QUANT-NO REFLEX-BLD
HIV 1 RNA Quant: <20 {copies}/mL
HIV-1 RNA Quant, Log: <1.3 {Log_copies}/mL

## 2019-08-25 MED FILL — JULUCA 50-25 MG TAB: 50-25 | 30 days supply | Qty: 30 | Fill #0

## 2019-08-25 MED FILL — ZIDOVUDINE 300 MG TABLET: 300 | 30 days supply | Qty: 30 | Fill #0

## 2019-09-02 NOTE — Telephone Encounter (Signed)
He is an HD patient, not unusual for him to have elevated cr. thanks

## 2019-09-04 ENCOUNTER — Other Ambulatory Visit: Payer: Self-pay

## 2019-09-04 ENCOUNTER — Encounter (HOSPITAL_COMMUNITY): Payer: Self-pay | Admitting: *Deleted

## 2019-09-04 ENCOUNTER — Emergency Department (HOSPITAL_COMMUNITY): Payer: Medicare Other

## 2019-09-04 ENCOUNTER — Inpatient Hospital Stay (HOSPITAL_COMMUNITY)
Admission: EM | Admit: 2019-09-04 | Discharge: 2019-09-07 | DRG: 291 | Disposition: A | Discharge status: Home / Self Care | Payer: Medicare Other | Attending: Internal Medicine | Admitting: Internal Medicine

## 2019-09-04 DIAGNOSIS — I340 Nonrheumatic mitral (valve) insufficiency: Secondary | ICD-10-CM | POA: Diagnosis present

## 2019-09-04 DIAGNOSIS — K5900 Constipation, unspecified: Secondary | ICD-10-CM | POA: Diagnosis present

## 2019-09-04 DIAGNOSIS — B20 Human immunodeficiency virus [HIV] disease: Secondary | ICD-10-CM | POA: Diagnosis present

## 2019-09-04 DIAGNOSIS — R0902 Hypoxemia: Secondary | ICD-10-CM

## 2019-09-04 DIAGNOSIS — G894 Chronic pain syndrome: Secondary | ICD-10-CM | POA: Diagnosis present

## 2019-09-04 DIAGNOSIS — Z87891 Personal history of nicotine dependence: Secondary | ICD-10-CM

## 2019-09-04 DIAGNOSIS — Z720 Tobacco use: Secondary | ICD-10-CM | POA: Diagnosis present

## 2019-09-04 DIAGNOSIS — I4891 Unspecified atrial fibrillation: Secondary | ICD-10-CM | POA: Diagnosis not present

## 2019-09-04 DIAGNOSIS — F419 Anxiety disorder, unspecified: Secondary | ICD-10-CM | POA: Diagnosis present

## 2019-09-04 DIAGNOSIS — I160 Hypertensive urgency: Secondary | ICD-10-CM | POA: Diagnosis present

## 2019-09-04 DIAGNOSIS — Z992 Dependence on renal dialysis: Secondary | ICD-10-CM

## 2019-09-04 DIAGNOSIS — M109 Gout, unspecified: Secondary | ICD-10-CM | POA: Diagnosis present

## 2019-09-04 DIAGNOSIS — I5033 Acute on chronic diastolic (congestive) heart failure: Secondary | ICD-10-CM | POA: Diagnosis present

## 2019-09-04 DIAGNOSIS — J810 Acute pulmonary edema: Secondary | ICD-10-CM | POA: Diagnosis not present

## 2019-09-04 DIAGNOSIS — I132 Hypertensive heart and chronic kidney disease with heart failure and with stage 5 chronic kidney disease, or end stage renal disease: Principal | ICD-10-CM | POA: Diagnosis present

## 2019-09-04 DIAGNOSIS — G4733 Obstructive sleep apnea (adult) (pediatric): Secondary | ICD-10-CM | POA: Diagnosis present

## 2019-09-04 DIAGNOSIS — K219 Gastro-esophageal reflux disease without esophagitis: Secondary | ICD-10-CM | POA: Diagnosis present

## 2019-09-04 DIAGNOSIS — J302 Other seasonal allergic rhinitis: Secondary | ICD-10-CM | POA: Diagnosis present

## 2019-09-04 DIAGNOSIS — J9601 Acute respiratory failure with hypoxia: Secondary | ICD-10-CM | POA: Diagnosis present

## 2019-09-04 DIAGNOSIS — I10 Essential (primary) hypertension: Secondary | ICD-10-CM | POA: Diagnosis present

## 2019-09-04 DIAGNOSIS — E669 Obesity, unspecified: Secondary | ICD-10-CM | POA: Diagnosis present

## 2019-09-04 DIAGNOSIS — Z7951 Long term (current) use of inhaled steroids: Secondary | ICD-10-CM

## 2019-09-04 DIAGNOSIS — Z79899 Other long term (current) drug therapy: Secondary | ICD-10-CM

## 2019-09-04 DIAGNOSIS — D631 Anemia in chronic kidney disease: Secondary | ICD-10-CM | POA: Diagnosis present

## 2019-09-04 DIAGNOSIS — F329 Major depressive disorder, single episode, unspecified: Secondary | ICD-10-CM | POA: Diagnosis present

## 2019-09-04 DIAGNOSIS — Z6837 Body mass index (BMI) 37.0-37.9, adult: Secondary | ICD-10-CM

## 2019-09-04 DIAGNOSIS — J9621 Acute and chronic respiratory failure with hypoxia: Secondary | ICD-10-CM | POA: Diagnosis present

## 2019-09-04 DIAGNOSIS — Z8249 Family history of ischemic heart disease and other diseases of the circulatory system: Secondary | ICD-10-CM

## 2019-09-04 DIAGNOSIS — G40909 Epilepsy, unspecified, not intractable, without status epilepticus: Secondary | ICD-10-CM | POA: Diagnosis present

## 2019-09-04 DIAGNOSIS — N186 End stage renal disease: Secondary | ICD-10-CM | POA: Diagnosis present

## 2019-09-04 DIAGNOSIS — J449 Chronic obstructive pulmonary disease, unspecified: Secondary | ICD-10-CM | POA: Diagnosis present

## 2019-09-04 DIAGNOSIS — E0590 Thyrotoxicosis, unspecified without thyrotoxic crisis or storm: Secondary | ICD-10-CM | POA: Diagnosis present

## 2019-09-04 DIAGNOSIS — E785 Hyperlipidemia, unspecified: Secondary | ICD-10-CM | POA: Diagnosis present

## 2019-09-04 DIAGNOSIS — Z20822 Contact with and (suspected) exposure to covid-19: Secondary | ICD-10-CM | POA: Diagnosis present

## 2019-09-04 LAB — CBC WITH DIFFERENTIAL/PLATELET
Abs Immature Granulocytes: 0.15 10*3/uL — ABNORMAL HIGH (ref 0.00–0.07)
Basophils Absolute: 0 10*3/uL (ref 0.0–0.1)
Basophils Relative: 0 %
Eosinophils Absolute: 0.1 10*3/uL (ref 0.0–0.5)
Eosinophils Relative: 1 %
HCT: 32.1 % — ABNORMAL LOW (ref 39.0–52.0)
Hemoglobin: 10.2 g/dL — ABNORMAL LOW (ref 13.0–17.0)
Immature Granulocytes: 1 %
Lymphocytes Relative: 11 %
Lymphs Abs: 1.2 10*3/uL (ref 0.7–4.0)
MCH: 33 pg (ref 26.0–34.0)
MCHC: 31.8 g/dL (ref 30.0–36.0)
MCV: 103.9 fL — ABNORMAL HIGH (ref 80.0–100.0)
Monocytes Absolute: 0.7 10*3/uL (ref 0.1–1.0)
Monocytes Relative: 6 %
Neutro Abs: 8.8 10*3/uL — ABNORMAL HIGH (ref 1.7–7.7)
Neutrophils Relative %: 81 %
Platelets: 248 10*3/uL (ref 150–400)
RBC: 3.09 MIL/uL — ABNORMAL LOW (ref 4.22–5.81)
RDW: 15.5 % (ref 11.5–15.5)
WBC: 11 10*3/uL — ABNORMAL HIGH (ref 4.0–10.5)
nRBC: 0.4 % — ABNORMAL HIGH (ref 0.0–0.2)

## 2019-09-04 LAB — COMPREHENSIVE METABOLIC PANEL
ALT: 29 U/L (ref 0–44)
AST: 30 U/L (ref 15–41)
Albumin: 3.6 g/dL (ref 3.5–5.0)
Alkaline Phosphatase: 62 U/L (ref 38–126)
Anion gap: 23 — ABNORMAL HIGH (ref 5–15)
BUN: 93 mg/dL — ABNORMAL HIGH (ref 6–20)
CO2: 21 mmol/L — ABNORMAL LOW (ref 22–32)
Calcium: 8.8 mg/dL — ABNORMAL LOW (ref 8.9–10.3)
Chloride: 93 mmol/L — ABNORMAL LOW (ref 98–111)
Creatinine, Ser: 15.12 mg/dL — ABNORMAL HIGH (ref 0.61–1.24)
GFR calc Af Amer: 4 mL/min — ABNORMAL LOW (ref >60)
GFR calc non Af Amer: 3 mL/min — ABNORMAL LOW (ref >60)
Glucose, Bld: 158 mg/dL — ABNORMAL HIGH (ref 70–99)
Potassium: 4.6 mmol/L (ref 3.5–5.1)
Sodium: 137 mmol/L (ref 135–145)
Total Bilirubin: 0.9 mg/dL (ref 0.3–1.2)
Total Protein: 7.2 g/dL (ref 6.5–8.1)

## 2019-09-04 LAB — TROPONIN I (HIGH SENSITIVITY): Troponin I (High Sensitivity): 61 ng/L — ABNORMAL HIGH (ref <18)

## 2019-09-04 MED ORDER — NITROGLYCERIN IN D5W 200-5 MCG/ML-% IV SOLN
0.0000 ug/min | INTRAVENOUS | Status: DC
  Administered 2019-09-04: 23:00:00 10 ug/min via INTRAVENOUS
  Filled 2019-09-04: qty 250

## 2019-09-04 MED ORDER — NITROGLYCERIN IN D5W 200-5 MCG/ML-% IV SOLN: 0.0000 ug/min | INTRAVENOUS | Status: DC

## 2019-09-04 NOTE — ED Triage Notes (Signed)
The pt arrived by [facility] from home  Sob for one hour  Dialysis pt due for dialkysis tomorrow  Fistula rt arm  Initial po 78%  deoneb by firfe at his home   c-pap in the ambulance  releived his sob some waht

## 2019-09-04 NOTE — ED Provider Notes (Signed)
[facility] EMERGENCY DEPARTMENT Provider Note   CSN: [id number] Arrival date & time: 09/04/19  2215     History Chief Complaint  Patient presents with  . Shortness of Breath    Darrell Johnson is a 54 y.o. adult.  54y/o male with hx of ESRD on dialysis MWF, CHF, COPD, HIV, hypertension and sz who is presenting today with EMS for sudden onset of SOB.  Pt was diaphoretic, hypertensive and hypoxic today with O2 sats in the 70's which only improved to the 80's on NRB so was started on cpap with improvement.  Pt was given neb en route.  Pt reports feeling fine today prior to episode.  No chest pain or fever.  Cough just started with SOB.  Pt last dialyzed on Friday and did not miss any dialysis last week.  The history is provided by the EMS personnel.  Shortness of Breath Severity:  Severe Onset quality:  Sudden Duration:  1 hour Timing:  Constant Progression:  Worsening Chronicity:  New Context comment:  Started at rest appx 1 hour ago Relieved by:  Nothing Worsened by:  Activity Ineffective treatments:  None tried Associated symptoms: cough and diaphoresis   Associated symptoms: no chest pain, no sputum production and no vomiting   Risk factors: no prolonged immobilization and no recent surgery   Risk factors comment:  Hx of ESRD on dialysis      Past Medical History:  Diagnosis Date  . Anemia   . Anxiety   . Arthritis    "knees" (04/13/2017)  . Asthma   . CHF (congestive heart failure) ([place])   . ESRD (end stage renal disease) on dialysis [facility])    "MWF; [facility]" (04/13/2017)  . Gout, unspecified 08/13/2009   Qualifier: Diagnosis of  By: Redmond Pulling  MD, Mateo Flow    . H1N1 influenza    March 2016  . History of blood transfusion 02/2017   "related to OR"  . HIV infection ([place]) 1980's   on ART therapy since, followed by ID clinic, complicated  by neuropathy  . Hyperlipidemia    hypertrygliceridemia determined ti be secondary to ART therpay  .  HYPERTENSION 05/08/2006  . Pneumonia    "once; years ago" (04/13/2017)  . Rib fractures 01/2009  . Seizures ([place])    last seizure was in the 1990s, pt has family history of seizures; "probably related to alcohol" (04/13/2017)  . 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 Syphilis 1997   history of syphilis 1997  . Tobacco abuse     Patient Active Problem List   Diagnosis Date Noted  . GERD (gastroesophageal reflux disease) 02/14/2019  . Depression 02/14/2019  . Elevated troponin 02/14/2019  . Acute on chronic diastolic (congestive) heart failure ([place]) 02/14/2019  . Pulmonary edema 01/31/2019  . Moderate mitral regurgitation   . Acute respiratory failure with hypoxia ([place]) 03/29/2017  . Trigger finger of right hand 09/11/2016  . Hyperlipidemia 03/31/2016  . Anxiety state 03/11/2016  . Lumbar radiculopathy 01/28/2016  . Seasonal allergies   . Healthcare maintenance 05/03/2015  . Anemia in chronic kidney disease 08/30/2014  . ESRD on dialysis ([place]) 12/27/2013  . History of syphilis 11/07/2013  . Arthritis 09/09/2013  . Tobacco abuse 09/07/2013  . Chronic pain disorder 04/28/2013  . Gout 08/13/2009  . HIV disease ([place]) 05/08/2006  . Essential  hypertension 05/08/2006  . SEIZURE DISORDER 05/08/2006    Past Surgical History:  Procedure Laterality Date  . AV FISTULA PLACEMENT Right 12/02/2013   Procedure: ARTERIOVENOUS (AV) FISTULA CREATION;  Surgeon: Rosetta Posner, MD;  Location: [place];  Service: Vascular;  Laterality: Right;  . AV FISTULA PLACEMENT Right 06/09/2019   Procedure: Right Arm Fistula Plication.;  Surgeon: Marty Heck, MD;  Location: [place];  Service: Vascular;  Laterality: Right;  . INCISION AND DRAINAGE ABSCESS Right 03/09/2017   Procedure: INCISION AND DRAINAGE Right Scrotal Abscess;  Surgeon: Franchot Gallo, MD;   Location: [place];  Service: Urology;  Laterality: Right;  . THROMBECTOMY Right ~ 2016   "AV fistula clotted off"       Family History  Problem Relation Age of Onset  . Cancer Mother   . Hypertension Mother   . COPD Father   . Hypertension Father   . Diabetes Sister   . Hypertension Sister   . Diabetes Brother   . Hypertension Brother   . Stroke Neg Hx     Social History   Tobacco Use  . Smoking status: Former Smoker    Packs/day: 0.10    Years: 25.00    Pack years: 2.50    Types: Cigarettes    Quit date: 12/20/2018    Years since quitting: 0.7  . Smokeless tobacco: Never Used  . Tobacco comment: "quitting"  Substance Use Topics  . Alcohol use: No    Alcohol/week: 0.0 standard drinks    Comment: 04/13/2017 "quit ~ 2014"  . Drug use: No    Home Medications Prior to Admission medications   Medication Sig Start Date End Date Taking? Authorizing Provider  albuterol (PROVENTIL HFA;VENTOLIN HFA) 108 (90 Base) MCG/ACT inhaler Inhale 2 puffs into the lungs every 4 (four) hours as needed for up to 30 days for wheezing or shortness of breath. 09/21/18 06/09/19  Kerin Perna, NP  amitriptyline (ELAVIL) 25 MG tablet TAKE 1 TABLET BY MOUTH EVERYDAY AT BEDTIME Patient not taking: No sig reported 02/10/19   Carlyle Basques, MD  amLODipine (NORVASC) 10 MG tablet Take 1 tablet (10 mg total) by mouth daily. Patient taking differently: Take 10 mg by mouth at bedtime.  09/21/18   Kerin Perna, NP  azelastine (OPTIVAR) 0.05 % ophthalmic solution Place 1 drop into both eyes daily.  12/24/18   [provider]  budesonide-formoterol (SYMBICORT) 160-4.5 MCG/ACT inhaler Inhale 2 puffs into the lungs 2 (two) times daily. 09/21/18   Kerin Perna, NP  buPROPion (WELLBUTRIN SR) 100 MG 12 hr tablet TAKE 1 TABLET BY MOUTH TWICE A DAY Patient not taking: No sig reported 02/10/19   Carlyle Basques, MD  carvedilol (COREG) 25 MG tablet Take 1 tablet (25 mg total) by mouth 2 (two) times  daily with a meal. 09/21/18   Kerin Perna, NP  doxazosin (CARDURA) 8 MG tablet Take 1 tablet (8 mg total) by mouth at bedtime. 09/21/18   Kerin Perna, NP  ferric citrate (AURYXIA) 1 GM 210 MG(Fe) tablet Take 420 mg by mouth 3 (three) times daily with meals.    [provider]  fluticasone (FLONASE) 50 MCG/ACT nasal spray Place 1 spray into both nostrils as needed for allergies.  09/23/18   [provider]  hydrALAZINE (APRESOLINE) 100 MG tablet Take 100 mg by mouth 3 (three) times daily.  12/01/18   [provider]  ipratropium-albuterol (DUONEB) 0.5-2.5 (3) MG/3ML SOLN Take 3 mLs by nebulization every  6 (six) hours as needed. Patient taking differently: Take 3 mLs by nebulization every 6 (six) hours as needed (shortness of breath and wheezing).  09/21/18   Kerin Perna, NP  JULUCA 50-25 MG TABS TAKE 1 TABLET BY MOUTH DAILY WITH SUPPER. 08/22/19   Carlyle Basques, MD  lisinopril (ZESTRIL) 10 MG tablet Take 10 mg by mouth at bedtime.  09/06/18   [provider]  montelukast (SINGULAIR) 10 MG tablet Take 10 mg by mouth at bedtime.  11/16/18   [provider]  oxyCODONE-acetaminophen (PERCOCET) 5-325 MG tablet Take 1 tablet by mouth every 6 (six) hours as needed for severe pain. 06/09/19   Rhyne, Hulen Shouts, PA-C  pravastatin (PRAVACHOL) 40 MG tablet TAKE 1 TABLET BY MOUTH EVERY DAY 07/07/19   Kerin Perna, NP  sucroferric oxyhydroxide (VELPHORO) 500 MG chewable tablet Chew 500 mg by mouth 3 (three) times daily with meals.    [provider]  zidovudine (RETROVIR) 300 MG tablet TAKE 1 TABLET (300 MG TOTAL) BY MOUTH DAILY. 08/22/19   Carlyle Basques, MD    Allergies    Patient has no known allergies.  Review of Systems   Review of Systems  Constitutional: Positive for diaphoresis.  Respiratory: Positive for cough and shortness of breath. Negative for sputum production.   Cardiovascular: Negative for chest pain.    Gastrointestinal: Negative for vomiting.  All other systems reviewed and are negative.   Physical Exam Updated Vital Signs BP (!) 145/82   Pulse (!) 106   Temp (!) 97 F (36.1 C)   Resp 20   Wt 109.8 kg   SpO2 100%   BMI 35.75 kg/m   Physical Exam Vitals and nursing note reviewed.  Constitutional:      General: He is in acute distress.     Appearance: He is well-developed and normal weight. He is diaphoretic.  HENT:     Head: Normocephalic and atraumatic.     Mouth/Throat:     Mouth: Mucous membranes are moist.  Eyes:     Pupils: Pupils are equal, round, and reactive to light.  Cardiovascular:     Rate and Rhythm: Regular rhythm. Tachycardia present.     Pulses: Normal pulses.     Heart sounds: Normal heart sounds. No murmur. No friction rub.  Pulmonary:     Effort: Tachypnea, accessory muscle usage and respiratory distress present.     Breath sounds: Wheezing and rales present.  Abdominal:     General: Bowel sounds are normal. There is distension.     Palpations: Abdomen is soft.     Tenderness: There is no abdominal tenderness. There is no guarding or rebound.  Musculoskeletal:        General: No tenderness. Normal range of motion.     Comments: No edema  Skin:    Capillary Refill: Capillary refill takes 2 to 3 seconds.     Findings: No rash.     Comments: Cool and clammy  Neurological:     Mental Status: He is alert and oriented to person, place, and time. Mental status is at baseline.     Cranial Nerves: No cranial nerve deficit.  Psychiatric:        Mood and Affect: Mood normal.        Behavior: Behavior normal.        Thought Content: Thought content normal.     ED Results / Procedures / Treatments   Labs (all labs ordered are listed, but only abnormal  results are displayed) Labs Reviewed  CBC WITH DIFFERENTIAL/PLATELET - Abnormal; Notable for the following components:      Result Value   WBC 11.0 (*)    RBC 3.09 (*)    Hemoglobin 10.2 (*)     HCT 32.1 (*)    MCV 103.9 (*)    nRBC 0.4 (*)    Neutro Abs 8.8 (*)    Abs Immature Granulocytes 0.15 (*)    All other components within normal limits  COMPREHENSIVE METABOLIC PANEL - Abnormal; Notable for the following components:   Chloride 93 (*)    CO2 21 (*)    Glucose, Bld 158 (*)    BUN 93 (*)    Creatinine, Ser 15.12 (*)    Calcium 8.8 (*)    GFR calc non Af Amer 3 (*)    GFR calc Af Amer 4 (*)    Anion gap 23 (*)    All other components within normal limits  TROPONIN I (HIGH SENSITIVITY) - Abnormal; Notable for the following components:   Troponin I (High Sensitivity) 61 (*)    All other components within normal limits  RESPIRATORY PANEL BY RT PCR (FLU A&B, COVID)  TROPONIN I (HIGH SENSITIVITY)    EKG EKG Interpretation  Date/Time:  "Sunday September 04 2019 22:25:27 EST Ventricular Rate:  124 PR Interval:    QRS Duration: 100 QT Interval:  321 QTC Calculation: 461 R Axis:   69 Text Interpretation: Sinus tachycardia Paired ventricular premature complexes Aberrant complex LAE, consider biatrial enlargement RSR' in V1 or V2, right VCD or RVH Artifact in lead(s) I III aVL V5 V6 Confirmed by Samariah Hokenson ([id number]) on 09/04/2019 10:27:03 PM   Radiology DG Chest Port 1 View  Result Date: 09/04/2019 CLINICAL DATA:  Shortness of breath EXAM: PORTABLE CHEST 1 VIEW COMPARISON:  02/20/2019 FINDINGS: There is scattered bilateral airspace opacities, greatest in the peripheral right lung zone. There is chronic stable pleural thickening along the right chest wall. There is chronic blunting of the right costophrenic angle with a new small to moderate size right-sided pleural effusion. There is a retrocardiac opacity. There is cardiomegaly with vascular congestion. There is no pneumothorax. IMPRESSION: 1. Cardiomegaly with vascular congestion. 2. Small to moderate size right-sided pleural effusion. 3. Scattered bilateral airspace opacities which may represent multifocal pneumonia or  developing pulmonary edema. 4. Chronic pleural thickening along the right chest wall. Electronically Signed   By: Christopher  Green M.D.   On: 09/04/2019 22:47    Procedures Procedures (including critical care time)  Medications Ordered in ED Medications  nitroGLYCERIN 50 mg in dextrose 5 % 250 mL (0.2 mg/mL) infusion (has no administration in time range)    ED Course  I have reviewed the triage vital signs and the nursing notes.  Pertinent labs & imaging results that were available during my care of the patient were reviewed by me and considered in my medical decision making (see chart for details).    MDM Rules/Calculators/A&P                     53"  y/o male presenting in sudden onset acute resp distress.  Pt with hx of esrd and hypoxia, hypertension but awake and alert.  Pt started on bipap with improvement in work of breathing and O2 sats.  Pt started on NTG SL until IV access obtained.  Pt states improvement with bipap.  Concern for flash pulmonary edema.  No report of infectious sx with lower suspicion for  COVID or PNA.  Labs, cxr pending.  Will monitor closely for improvement.  12:01 AM EKG was sinus tachycardia but no acute electrical changes.  Chest x-ray with evidence of fluid overload.  Labs with minimal leukocytosis of 11,000 and CMP with elevated anion gap and evidence of chronic renal disease.  Patient improved with nitroglycerin and BiPAP and now is breathing very comfortably.  Spoke with Dr. Jonnie Finner who will see the patient in order dialysis.  Patient will be admitted.  Checked out to Dr. Betsey Holiday  CRITICAL CARE Performed by: Jesiah Grismer Total critical care time: 30 minutes Critical care time was exclusive of separately billable procedures and treating other patients. Critical care was necessary to treat or prevent imminent or life-threatening deterioration. Critical care was time spent personally by me on the following activities: development of treatment plan with  patient and/or surrogate as well as nursing, discussions with consultants, evaluation of patient's response to treatment, examination of patient, obtaining history from patient or surrogate, ordering and performing treatments and interventions, ordering and review of laboratory studies, ordering and review of radiographic studies, pulse oximetry and re-evaluation of patient's condition.   Final Clinical Impression(s) / ED Diagnoses Final diagnoses:  Flash pulmonary edema ([place])  Hypoxia  ESRD (end stage renal disease) ([place])    Rx / DC Orders ED Discharge Orders    None       Blanchie Dessert, MD 09/05/19 0002

## 2019-09-05 ENCOUNTER — Encounter (HOSPITAL_COMMUNITY): Payer: Self-pay | Admitting: Internal Medicine

## 2019-09-05 ENCOUNTER — Other Ambulatory Visit: Payer: Self-pay

## 2019-09-05 DIAGNOSIS — I5033 Acute on chronic diastolic (congestive) heart failure: Secondary | ICD-10-CM | POA: Diagnosis present

## 2019-09-05 DIAGNOSIS — J810 Acute pulmonary edema: Secondary | ICD-10-CM

## 2019-09-05 DIAGNOSIS — I160 Hypertensive urgency: Secondary | ICD-10-CM

## 2019-09-05 DIAGNOSIS — N186 End stage renal disease: Secondary | ICD-10-CM

## 2019-09-05 DIAGNOSIS — I340 Nonrheumatic mitral (valve) insufficiency: Secondary | ICD-10-CM | POA: Diagnosis present

## 2019-09-05 DIAGNOSIS — F419 Anxiety disorder, unspecified: Secondary | ICD-10-CM | POA: Diagnosis present

## 2019-09-05 DIAGNOSIS — M109 Gout, unspecified: Secondary | ICD-10-CM | POA: Diagnosis present

## 2019-09-05 DIAGNOSIS — G4733 Obstructive sleep apnea (adult) (pediatric): Secondary | ICD-10-CM | POA: Diagnosis present

## 2019-09-05 DIAGNOSIS — Z20822 Contact with and (suspected) exposure to covid-19: Secondary | ICD-10-CM | POA: Diagnosis present

## 2019-09-05 DIAGNOSIS — K219 Gastro-esophageal reflux disease without esophagitis: Secondary | ICD-10-CM | POA: Diagnosis present

## 2019-09-05 DIAGNOSIS — J449 Chronic obstructive pulmonary disease, unspecified: Secondary | ICD-10-CM | POA: Diagnosis present

## 2019-09-05 DIAGNOSIS — E785 Hyperlipidemia, unspecified: Secondary | ICD-10-CM | POA: Diagnosis present

## 2019-09-05 DIAGNOSIS — J302 Other seasonal allergic rhinitis: Secondary | ICD-10-CM | POA: Diagnosis present

## 2019-09-05 DIAGNOSIS — K5900 Constipation, unspecified: Secondary | ICD-10-CM | POA: Diagnosis present

## 2019-09-05 DIAGNOSIS — D631 Anemia in chronic kidney disease: Secondary | ICD-10-CM | POA: Diagnosis present

## 2019-09-05 DIAGNOSIS — F329 Major depressive disorder, single episode, unspecified: Secondary | ICD-10-CM | POA: Diagnosis present

## 2019-09-05 DIAGNOSIS — G894 Chronic pain syndrome: Secondary | ICD-10-CM | POA: Diagnosis present

## 2019-09-05 DIAGNOSIS — Z8249 Family history of ischemic heart disease and other diseases of the circulatory system: Secondary | ICD-10-CM | POA: Diagnosis not present

## 2019-09-05 DIAGNOSIS — J9621 Acute and chronic respiratory failure with hypoxia: Secondary | ICD-10-CM | POA: Diagnosis present

## 2019-09-05 DIAGNOSIS — E0590 Thyrotoxicosis, unspecified without thyrotoxic crisis or storm: Secondary | ICD-10-CM | POA: Diagnosis present

## 2019-09-05 DIAGNOSIS — I4891 Unspecified atrial fibrillation: Secondary | ICD-10-CM | POA: Diagnosis not present

## 2019-09-05 DIAGNOSIS — J9601 Acute respiratory failure with hypoxia: Secondary | ICD-10-CM | POA: Diagnosis present

## 2019-09-05 DIAGNOSIS — G40909 Epilepsy, unspecified, not intractable, without status epilepticus: Secondary | ICD-10-CM | POA: Diagnosis present

## 2019-09-05 DIAGNOSIS — I132 Hypertensive heart and chronic kidney disease with heart failure and with stage 5 chronic kidney disease, or end stage renal disease: Secondary | ICD-10-CM | POA: Diagnosis present

## 2019-09-05 DIAGNOSIS — Z992 Dependence on renal dialysis: Secondary | ICD-10-CM

## 2019-09-05 DIAGNOSIS — E669 Obesity, unspecified: Secondary | ICD-10-CM | POA: Diagnosis present

## 2019-09-05 DIAGNOSIS — B20 Human immunodeficiency virus [HIV] disease: Secondary | ICD-10-CM | POA: Diagnosis present

## 2019-09-05 LAB — HEPATITIS PANEL, ACUTE
HCV Ab: NONREACTIVE
Hep A IgM: NONREACTIVE
Hep B C IgM: NONREACTIVE
Hepatitis B Surface Ag: NONREACTIVE

## 2019-09-05 LAB — CREATININE, SERUM
Creatinine, Ser: 15.56 mg/dL — ABNORMAL HIGH (ref 0.61–1.24)
GFR calc Af Amer: 4 mL/min — ABNORMAL LOW (ref >60)
GFR calc non Af Amer: 3 mL/min — ABNORMAL LOW (ref >60)

## 2019-09-05 LAB — RENAL FUNCTION PANEL
Albumin: 3.6 g/dL (ref 3.5–5.0)
Anion gap: 17 — ABNORMAL HIGH (ref 5–15)
BUN: 49 mg/dL — ABNORMAL HIGH (ref 6–20)
CO2: 27 mmol/L (ref 22–32)
Calcium: 9.2 mg/dL (ref 8.9–10.3)
Chloride: 94 mmol/L — ABNORMAL LOW (ref 98–111)
Creatinine, Ser: 8.57 mg/dL — ABNORMAL HIGH (ref 0.61–1.24)
GFR calc Af Amer: 7 mL/min — ABNORMAL LOW (ref >60)
GFR calc non Af Amer: 6 mL/min — ABNORMAL LOW (ref >60)
Glucose, Bld: 92 mg/dL (ref 70–99)
Phosphorus: 6.4 mg/dL — ABNORMAL HIGH (ref 2.5–4.6)
Potassium: 4 mmol/L (ref 3.5–5.1)
Sodium: 138 mmol/L (ref 135–145)

## 2019-09-05 LAB — GLUCOSE, CAPILLARY: Glucose-Capillary: 87 mg/dL (ref 70–99)

## 2019-09-05 LAB — COMPREHENSIVE METABOLIC PANEL
ALT: 30 U/L (ref 0–44)
AST: 24 U/L (ref 15–41)
Albumin: 3.4 g/dL — ABNORMAL LOW (ref 3.5–5.0)
Alkaline Phosphatase: 62 U/L (ref 38–126)
Anion gap: 23 — ABNORMAL HIGH (ref 5–15)
BUN: 65 mg/dL — ABNORMAL HIGH (ref 6–20)
CO2: 26 mmol/L (ref 22–32)
Calcium: 9.2 mg/dL (ref 8.9–10.3)
Chloride: 93 mmol/L — ABNORMAL LOW (ref 98–111)
Creatinine, Ser: 11.25 mg/dL — ABNORMAL HIGH (ref 0.61–1.24)
GFR calc Af Amer: 5 mL/min — ABNORMAL LOW (ref >60)
GFR calc non Af Amer: 5 mL/min — ABNORMAL LOW (ref >60)
Glucose, Bld: 97 mg/dL (ref 70–99)
Potassium: 4.3 mmol/L (ref 3.5–5.1)
Sodium: 142 mmol/L (ref 135–145)
Total Bilirubin: 0.6 mg/dL (ref 0.3–1.2)
Total Protein: 6.2 g/dL — ABNORMAL LOW (ref 6.5–8.1)

## 2019-09-05 LAB — CBC
HCT: 29.5 % — ABNORMAL LOW (ref 39.0–52.0)
HCT: 29.7 % — ABNORMAL LOW (ref 39.0–52.0)
Hemoglobin: 9.4 g/dL — ABNORMAL LOW (ref 13.0–17.0)
Hemoglobin: 9.6 g/dL — ABNORMAL LOW (ref 13.0–17.0)
MCH: 32.9 pg (ref 26.0–34.0)
MCH: 34 pg (ref 26.0–34.0)
MCHC: 31.9 g/dL (ref 30.0–36.0)
MCHC: 32.3 g/dL (ref 30.0–36.0)
MCV: 103.1 fL — ABNORMAL HIGH (ref 80.0–100.0)
MCV: 105.3 fL — ABNORMAL HIGH (ref 80.0–100.0)
Platelets: 208 10*3/uL (ref 150–400)
Platelets: 200 10*3/uL (ref 150–400)
RBC: 2.86 MIL/uL — ABNORMAL LOW (ref 4.22–5.81)
RBC: 2.82 MIL/uL — ABNORMAL LOW (ref 4.22–5.81)
RDW: 15.6 % — ABNORMAL HIGH (ref 11.5–15.5)
RDW: 15.4 % (ref 11.5–15.5)
WBC: 9.8 10*3/uL (ref 4.0–10.5)
WBC: 6.8 10*3/uL (ref 4.0–10.5)
nRBC: 0.3 % — ABNORMAL HIGH (ref 0.0–0.2)
nRBC: 0.4 % — ABNORMAL HIGH (ref 0.0–0.2)

## 2019-09-05 LAB — RESPIRATORY PANEL BY RT PCR (FLU A&B, COVID)
Influenza A by PCR: NEGATIVE
Influenza B by PCR: NEGATIVE
SARS Coronavirus 2 by RT PCR: NEGATIVE

## 2019-09-05 LAB — TROPONIN I (HIGH SENSITIVITY): Troponin I (High Sensitivity): 172 ng/L (ref <18)

## 2019-09-05 LAB — HEPATITIS B CORE ANTIBODY, TOTAL: Hep B Core Total Ab: NONREACTIVE

## 2019-09-05 LAB — PHOSPHORUS: Phosphorus: 8 mg/dL — ABNORMAL HIGH (ref 2.5–4.6)

## 2019-09-05 MED ORDER — ZIDOVUDINE 100 MG PO CAPS
300.0000 mg | ORAL_CAPSULE | Freq: Every day | ORAL | Status: DC
  Administered 2019-09-06 – 2019-09-07 (×2): 300 mg via ORAL
  Filled 2019-09-05 (×3): qty 3

## 2019-09-05 MED ORDER — HEPARIN BOLUS VIA INFUSION
4800.0000 [IU] | Freq: Once | INTRAVENOUS | Status: AC
  Administered 2019-09-05: 4800 [IU] via INTRAVENOUS
  Filled 2019-09-05: qty 4800

## 2019-09-05 MED ORDER — RILPIVIRINE HCL 25 MG PO TABS
25.0000 mg | ORAL_TABLET | Freq: Every day | ORAL | Status: DC
  Administered 2019-09-06 – 2019-09-07 (×2): 25 mg via ORAL
  Filled 2019-09-05 (×3): qty 1

## 2019-09-05 MED ORDER — ONDANSETRON HCL 4 MG/2ML IJ SOLN: 4.0000 mg | Freq: Four times a day (QID) | INTRAMUSCULAR | Status: DC | PRN

## 2019-09-05 MED ORDER — HEPARIN SODIUM (PORCINE) 1000 UNIT/ML IJ SOLN
INTRAMUSCULAR | Status: AC
  Administered 2019-09-05: 5000 [IU] via INTRAVENOUS_CENTRAL
  Filled 2019-09-05: qty 5

## 2019-09-05 MED ORDER — FERRIC CITRATE 1 GM 210 MG(FE) PO TABS: 210.0000 mg | ORAL_TABLET | ORAL | Status: DC

## 2019-09-05 MED ORDER — DOLUTEGRAVIR SODIUM 50 MG PO TABS
50.0000 mg | ORAL_TABLET | Freq: Every day | ORAL | Status: DC
  Administered 2019-09-06 – 2019-09-07 (×2): 50 mg via ORAL
  Filled 2019-09-05 (×3): qty 1

## 2019-09-05 MED ORDER — CARVEDILOL 25 MG PO TABS
25.0000 mg | ORAL_TABLET | Freq: Two times a day (BID) | ORAL | Status: DC
  Administered 2019-09-05 – 2019-09-07 (×4): 25 mg via ORAL
  Filled 2019-09-05 (×4): qty 1

## 2019-09-05 MED ORDER — ZOLPIDEM TARTRATE 5 MG PO TABS
5.0000 mg | ORAL_TABLET | Freq: Every evening | ORAL | Status: DC | PRN
  Administered 2019-09-05 – 2019-09-06 (×2): 5 mg via ORAL
  Filled 2019-09-05 (×2): qty 1

## 2019-09-05 MED ORDER — HEPARIN SODIUM (PORCINE) 5000 UNIT/ML IJ SOLN
5000.0000 [IU] | Freq: Three times a day (TID) | INTRAMUSCULAR | Status: DC
  Administered 2019-09-05: 5000 [IU] via SUBCUTANEOUS
  Filled 2019-09-05: qty 1

## 2019-09-05 MED ORDER — ONDANSETRON HCL 4 MG PO TABS: 4.0000 mg | ORAL_TABLET | Freq: Four times a day (QID) | ORAL | Status: DC | PRN

## 2019-09-05 MED ORDER — ACETAMINOPHEN 650 MG RE SUPP: 650.0000 mg | Freq: Four times a day (QID) | RECTAL | Status: DC | PRN

## 2019-09-05 MED ORDER — DOXAZOSIN MESYLATE 2 MG PO TABS
8.0000 mg | ORAL_TABLET | Freq: Every day | ORAL | Status: DC
  Administered 2019-09-05 – 2019-09-06 (×2): 8 mg via ORAL
  Filled 2019-09-05 (×2): qty 4

## 2019-09-05 MED ORDER — AMITRIPTYLINE HCL 50 MG PO TABS
25.0000 mg | ORAL_TABLET | Freq: Every day | ORAL | Status: DC
  Administered 2019-09-05 – 2019-09-06 (×2): 25 mg via ORAL
  Filled 2019-09-05 (×2): qty 1

## 2019-09-05 MED ORDER — POLYETHYLENE GLYCOL 3350 17 G PO PACK
17.0000 g | PACK | Freq: Every day | ORAL | Status: DC
  Administered 2019-09-06: 17 g via ORAL
  Filled 2019-09-05: qty 1

## 2019-09-05 MED ORDER — METOPROLOL TARTRATE 5 MG/5ML IV SOLN
2.5000 mg | INTRAVENOUS | Status: AC | PRN
  Administered 2019-09-05 (×2): 2.5 mg via INTRAVENOUS
  Filled 2019-09-05: qty 5

## 2019-09-05 MED ORDER — CAMPHOR-MENTHOL 0.5-0.5 % EX LOTN
1.0000 "application " | TOPICAL_LOTION | Freq: Three times a day (TID) | CUTANEOUS | Status: DC | PRN
  Filled 2019-09-05: qty 222

## 2019-09-05 MED ORDER — HEPARIN SODIUM (PORCINE) 1000 UNIT/ML DIALYSIS: 5000.0000 [IU] | Freq: Once | INTRAMUSCULAR | Status: AC

## 2019-09-05 MED ORDER — DOLUTEGRAVIR-RILPIVIRINE 50-25 MG PO TABS: 1.0000 | ORAL_TABLET | Freq: Every day | ORAL | Status: DC

## 2019-09-05 MED ORDER — HEPARIN (PORCINE) 25000 UT/250ML-% IV SOLN
1850.0000 [IU]/h | INTRAVENOUS | Status: DC
  Administered 2019-09-05: 1350 [IU]/h via INTRAVENOUS
  Administered 2019-09-06: 1550 [IU]/h via INTRAVENOUS
  Administered 2019-09-06: 1850 [IU]/h via INTRAVENOUS
  Filled 2019-09-05 (×3): qty 250

## 2019-09-05 MED ORDER — DOCUSATE SODIUM 283 MG RE ENEM
1.0000 | ENEMA | RECTAL | Status: DC | PRN
  Filled 2019-09-05: qty 1

## 2019-09-05 MED ORDER — SORBITOL 70 % SOLN
30.0000 mL | Status: DC | PRN
  Filled 2019-09-05: qty 30

## 2019-09-05 MED ORDER — FERRIC CITRATE 1 GM 210 MG(FE) PO TABS
420.0000 mg | ORAL_TABLET | Freq: Three times a day (TID) | ORAL | Status: DC
  Administered 2019-09-06 – 2019-09-07 (×4): 420 mg via ORAL
  Filled 2019-09-05 (×6): qty 2

## 2019-09-05 MED ORDER — IPRATROPIUM-ALBUTEROL 0.5-2.5 (3) MG/3ML IN SOLN: 3.0000 mL | Freq: Four times a day (QID) | RESPIRATORY_TRACT | Status: DC | PRN

## 2019-09-05 MED ORDER — HYDROXYZINE HCL 25 MG PO TABS: 25.0000 mg | ORAL_TABLET | Freq: Three times a day (TID) | ORAL | Status: DC | PRN

## 2019-09-05 MED ORDER — FERRIC CITRATE 1 GM 210 MG(FE) PO TABS
210.0000 mg | ORAL_TABLET | ORAL | Status: DC
  Administered 2019-09-05 – 2019-09-07 (×3): 210 mg via ORAL
  Filled 2019-09-05 (×7): qty 1

## 2019-09-05 MED ORDER — CALCIUM CARBONATE ANTACID 500 MG PO CHEW
CHEWABLE_TABLET | ORAL | Status: AC
  Filled 2019-09-05: qty 2

## 2019-09-05 MED ORDER — PRAVASTATIN SODIUM 40 MG PO TABS
40.0000 mg | ORAL_TABLET | Freq: Every day | ORAL | Status: DC
  Administered 2019-09-05 – 2019-09-06 (×2): 40 mg via ORAL
  Filled 2019-09-05 (×2): qty 1

## 2019-09-05 MED ORDER — MONTELUKAST SODIUM 10 MG PO TABS
10.0000 mg | ORAL_TABLET | Freq: Every day | ORAL | Status: DC
  Administered 2019-09-05 – 2019-09-06 (×2): 10 mg via ORAL
  Filled 2019-09-05 (×2): qty 1

## 2019-09-05 MED ORDER — CHLORHEXIDINE GLUCONATE CLOTH 2 % EX PADS
6.0000 | MEDICATED_PAD | Freq: Every day | CUTANEOUS | Status: DC
  Administered 2019-09-05: 6 via TOPICAL

## 2019-09-05 MED ORDER — NEPRO/CARBSTEADY PO LIQD: 237.0000 mL | Freq: Three times a day (TID) | ORAL | Status: DC | PRN

## 2019-09-05 MED ORDER — AZELASTINE HCL 0.1 % NA SOLN
1.0000 | Freq: Every day | NASAL | Status: DC
  Administered 2019-09-06 – 2019-09-07 (×2): 1 via NASAL
  Filled 2019-09-05: qty 30

## 2019-09-05 MED ORDER — AZELASTINE-FLUTICASONE 137-50 MCG/ACT NA SUSP: 1.0000 | Freq: Every day | NASAL | Status: DC

## 2019-09-05 MED ORDER — LISINOPRIL 10 MG PO TABS
10.0000 mg | ORAL_TABLET | Freq: Every day | ORAL | Status: DC
  Administered 2019-09-05 – 2019-09-06 (×2): 10 mg via ORAL
  Filled 2019-09-05 (×2): qty 1

## 2019-09-05 MED ORDER — ACETAMINOPHEN 325 MG PO TABS
650.0000 mg | ORAL_TABLET | Freq: Four times a day (QID) | ORAL | Status: DC | PRN
  Administered 2019-09-05 – 2019-09-06 (×2): 650 mg via ORAL
  Filled 2019-09-05 (×2): qty 2

## 2019-09-05 MED ORDER — CALCIUM CARBONATE ANTACID 500 MG PO CHEW
2.0000 | CHEWABLE_TABLET | Freq: Once | ORAL | Status: AC
  Administered 2019-09-05: 400 mg via ORAL

## 2019-09-05 MED ORDER — FLUTICASONE PROPIONATE 50 MCG/ACT NA SUSP
1.0000 | Freq: Every day | NASAL | Status: DC
  Administered 2019-09-06 – 2019-09-07 (×2): 1 via NASAL
  Filled 2019-09-05 (×2): qty 16

## 2019-09-05 MED ORDER — METOPROLOL TARTRATE 5 MG/5ML IV SOLN
INTRAVENOUS | Status: AC
  Filled 2019-09-05: qty 5

## 2019-09-05 MED ORDER — CALCIUM CARBONATE ANTACID 1250 MG/5ML PO SUSP
500.0000 mg | Freq: Four times a day (QID) | ORAL | Status: DC | PRN
  Filled 2019-09-05: qty 5

## 2019-09-05 MED ORDER — SENNA 8.6 MG PO TABS
1.0000 | ORAL_TABLET | Freq: Every day | ORAL | Status: DC
  Administered 2019-09-06: 8.6 mg via ORAL
  Filled 2019-09-05: qty 1

## 2019-09-05 MED ORDER — MOMETASONE FURO-FORMOTEROL FUM 200-5 MCG/ACT IN AERO
2.0000 | INHALATION_SPRAY | Freq: Two times a day (BID) | RESPIRATORY_TRACT | Status: DC
  Administered 2019-09-05 – 2019-09-06 (×3): 2 via RESPIRATORY_TRACT
  Filled 2019-09-05: qty 8.8

## 2019-09-05 NOTE — Progress Notes (Signed)
RT NOTE:  Pt taken off BIPAP and placed on NRB for transport to 4E13. WOB normal, SPO2 100%, RR 20. BIPAP in room at bedside if needed. Will titrated O2.

## 2019-09-05 NOTE — Progress Notes (Signed)
Addendum  RN paged with new onset of A. fib with RVR up to 170s, confirmed by EKG.  Patient asymptomatic and other vitals stable. Finally home medications were reconciled, started meds including carvedilol 25 mg twice daily but may take while to act and hence gave metoprolol 2.5 mg IV x2 doses, 5 minutes apart. CHA2DS2-VASc Score: At least 2.  Initiated IV heparin drip per pharmacy.  Hopefully initiation of his carvedilol will help control the rate.  Recommend cardiology consultation in a.m.    Vernell Leep, MD, [place], [facility]. [facility]  To contact the attending provider between 7A-7P or the covering provider during after hours 7P-7A, please log into the web site [email or web address] and access using universal [place] password for that web site. If you do not have the password, please call the hospital operator.

## 2019-09-05 NOTE — Progress Notes (Signed)
ANTICOAGULATION CONSULT NOTE - Follow Up Consult  Pharmacy Consult for Heparin Indication: atrial fibrillation  No Known Allergies  Patient Measurements: Height: 5\' 9"  (175.3 cm) Weight: 245 lb 9.5 oz (111.4 kg) IBW/kg (Calculated) : 70.7 Heparin Dosing Weight: 96.1 kg  Vital Signs: Temp: 98.2 F (36.8 C) (02/15 1521) Temp Source: Oral (02/15 1521) BP: 144/98 (02/15 1857) Pulse Rate: 179 (02/15 1814)  Labs: Recent Labs    09/04/19 2223 09/04/19 2223 09/05/19 0150 09/05/19 1059 09/05/19 1229  HGB 10.2*   < > 9.4* 9.6*  --   HCT 32.1*  --  29.5* 29.7*  --   PLT 248  --  208 200  --   CREATININE 15.12*   < > 15.56* 11.25* 8.57*  TROPONINIHS 61*  --  172*  --   --    < > = values in this interval not displayed.    Estimated Creatinine Clearance (by C-G formula based on SCr of 8.57 mg/dL (H)) Male: 10 mL/min (A) Male: 12.1 mL/min (A)   Assessment: 54 yo male found to be in Afib with RVR. Pharmacy consulted to initiate heparin drip.  Goal of Therapy:  Heparin level 0.3-0.7 units/ml Monitor platelets by anticoagulation protocol: Yes   Plan:  Give 4800 units bolus x 1 Start heparin infusion at 1350 units/hr Check anti-Xa level in 8 hours and daily while on heparin Continue to monitor H&H and platelets  Alanda Slim, PharmD, Cvp [facility] Clinical Pharmacist Please see AMION for all Pharmacists' Contact Phone Numbers 09/05/2019, 7:06 PM

## 2019-09-05 NOTE — Plan of Care (Signed)
Poc progressing.  

## 2019-09-05 NOTE — Progress Notes (Signed)
  [facility] Progress Note   Assessment/ Plan:    Dialysis orders: MWF GOC 4 hrs 30 min EDW 111.5 F200 dialyzer BFR 475  2K 2 Ca bath UF Profile 2 Parsabiv 10 q rx Heparin 6000 bolus and 3000 mid run Mircera 100 q 2 weeks Calcitriol 3.75 q rx  1. Resp distress/ hypoxemia - better after HD but still on nitrol gtt with hypoxia and requiring O2, BP up.  Will do HD on schedule today as well. 2. ESRD - on HD MWF.  Has not missed HD.  Over EDW by at least 3 kg. 3. HIV infection - on meds 4. HTN/ vol - on nitro gtt now, is hypertensive  5. Asthma/ COPD - on meds 6. Anemia ckd - Watch and ESA as needed  Subjective:    Had HD overnight but is still hypertensive on nitro gtt.  Still SOB   Objective:   BP (!) 161/85 (BP Location: Left Arm)   Pulse (!) 106   Temp 98.3 F (36.8 C) (Oral)   Resp 18   Ht 5\' 9"  (1.753 m)   Wt 114.1 kg   SpO2 100%   BMI 37.15 kg/m   Physical Exam: Gen: sitting on edge of bed, somewhat SOB CVS: tachycardic + S3 Resp: increased WOB, bilateral diffuse crackles Abd: soft, nontender NABS Ext: no real LE edema  Labs: BMET Recent Labs  Lab 09/04/19 2223 09/05/19 0150  NA 137  --   K 4.6  --   CL 93*  --   CO2 21*  --   GLUCOSE 158*  --   BUN 93*  --   CREATININE 15.12* 15.56*  CALCIUM 8.8*  --    CBC Recent Labs  Lab 09/04/19 2223 09/05/19 0150  WBC 11.0* 9.8  NEUTROABS 8.8*  --   HGB 10.2* 9.4*  HCT 32.1* 29.5*  MCV 103.9* 103.1*  PLT 248 208      Medications:    . Chlorhexidine Gluconate Cloth  6 each Topical Q0600  . heparin  5,000 Units Subcutaneous Q8H     Madelon Lips, MD 09/05/2019, 11:12 AM

## 2019-09-05 NOTE — Consult Note (Signed)
Renal Service Consult Note [facility]  Darrell Johnson 09/05/2019 Sol Blazing Requesting Physician:  Dr Maryan Rued  Reason for Consult:  Resp distress, ESRD HPI: The patient is a 54 y.o. year-old with hx of HTN, HL, HIV infection, gout , asthma/ COPD and ESRD on HD MWF presented to ED this evening w/ SOB, sats 78% on RA, symptom onset 1-2 hours prior.  Has not missed HD, last HD was Friday.  CXR shows bilat infiltrates, edema vs multifocal pna.  Asked to see for dialysis.   Pt denies any prod cough, fevers chills or sweats.  No abd pain. +constipated.  No leg edema.  No missed HD. On HD about 4-5 yrs.    ROS  denies CP  no joint pain   no HA  no blurry vision  no rash  no diarrhea  no nausea/ vomiting     Past Medical History  Past Medical History:  Diagnosis Date  . Anemia   . Anxiety   . Arthritis    "knees" (04/13/2017)  . Asthma   . CHF (congestive heart failure) ([place])   . ESRD (end stage renal disease) on dialysis [facility])    "MWF; [facility]" (04/13/2017)  . Gout, unspecified 08/13/2009   Qualifier: Diagnosis of  By: Redmond Pulling  MD, Mateo Flow    . H1N1 influenza    March 2016  . History of blood transfusion 02/2017   "related to OR"  . HIV infection ([place]) 1980's   on ART therapy since, followed by ID clinic, complicated  by neuropathy  . Hyperlipidemia    hypertrygliceridemia determined ti be secondary to ART therpay  . HYPERTENSION 05/08/2006  . Pneumonia    "once; years ago" (04/13/2017)  . Rib fractures 01/2009  . Seizures ([place])    last seizure was in the 1990s, pt has family history of seizures; "probably related to alcohol" (04/13/2017)  . 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 Syphilis 1997   history of syphilis 1997  . Tobacco abuse    Past Surgical History  Past Surgical History:  Procedure  Laterality Date  . AV FISTULA PLACEMENT Right 12/02/2013   Procedure: ARTERIOVENOUS (AV) FISTULA CREATION;  Surgeon: Rosetta Posner, MD;  Location: [place];  Service: Vascular;  Laterality: Right;  . AV FISTULA PLACEMENT Right 06/09/2019   Procedure: Right Arm Fistula Plication.;  Surgeon: Marty Heck, MD;  Location: [place];  Service: Vascular;  Laterality: Right;  . INCISION AND DRAINAGE ABSCESS Right 03/09/2017   Procedure: INCISION AND DRAINAGE Right Scrotal Abscess;  Surgeon: Franchot Gallo, MD;  Location: [place];  Service: Urology;  Laterality: Right;  . THROMBECTOMY Right ~ 2016   "AV fistula clotted off"   Family History  Family History  Problem Relation Age of Onset  . Cancer Mother   . Hypertension Mother   . COPD Father   . Hypertension Father   . Diabetes Sister   . Hypertension Sister   . Diabetes Brother   . Hypertension Brother   . Stroke Neg Hx    Social History  reports that he quit smoking about 8 months ago. His smoking use included cigarettes. He has a 2.50 pack-year smoking history. He has never used smokeless tobacco. He reports that he does not drink alcohol or use drugs. Allergies No Known Allergies Home medications Prior to  Admission medications   Medication Sig Start Date End Date Taking? Authorizing Provider  albuterol (PROVENTIL HFA;VENTOLIN HFA) 108 (90 Base) MCG/ACT inhaler Inhale 2 puffs into the lungs every 4 (four) hours as needed for up to 30 days for wheezing or shortness of breath. 09/21/18 06/09/19  Kerin Perna, NP  amitriptyline (ELAVIL) 25 MG tablet TAKE 1 TABLET BY MOUTH EVERYDAY AT BEDTIME Patient not taking: No sig reported 02/10/19   Carlyle Basques, MD  amLODipine (NORVASC) 10 MG tablet Take 1 tablet (10 mg total) by mouth daily. Patient taking differently: Take 10 mg by mouth at bedtime.  09/21/18   Kerin Perna, NP  azelastine (OPTIVAR) 0.05 % ophthalmic solution Place 1 drop into both eyes daily.  12/24/18   [provider]  budesonide-formoterol (SYMBICORT) 160-4.5 MCG/ACT inhaler Inhale 2 puffs into the lungs 2 (two) times daily. 09/21/18   Kerin Perna, NP  buPROPion (WELLBUTRIN SR) 100 MG 12 hr tablet TAKE 1 TABLET BY MOUTH TWICE A DAY Patient not taking: No sig reported 02/10/19   Carlyle Basques, MD  carvedilol (COREG) 25 MG tablet Take 1 tablet (25 mg total) by mouth 2 (two) times daily with a meal. 09/21/18   Kerin Perna, NP  doxazosin (CARDURA) 8 MG tablet Take 1 tablet (8 mg total) by mouth at bedtime. 09/21/18   Kerin Perna, NP  ferric citrate (AURYXIA) 1 GM 210 MG(Fe) tablet Take 420 mg by mouth 3 (three) times daily with meals.    [provider]  fluticasone (FLONASE) 50 MCG/ACT nasal spray Place 1 spray into both nostrils as needed for allergies.  09/23/18   [provider]  hydrALAZINE (APRESOLINE) 100 MG tablet Take 100 mg by mouth 3 (three) times daily.  12/01/18   [provider]  ipratropium-albuterol (DUONEB) 0.5-2.5 (3) MG/3ML SOLN Take 3 mLs by nebulization every 6 (six) hours as needed. Patient taking differently: Take 3 mLs by nebulization every 6 (six) hours as needed (shortness of breath and wheezing).  09/21/18   Kerin Perna, NP  JULUCA 50-25 MG TABS TAKE 1 TABLET BY MOUTH DAILY WITH SUPPER. 08/22/19   Carlyle Basques, MD  lisinopril (ZESTRIL) 10 MG tablet Take 10 mg by mouth at bedtime.  09/06/18   [provider]  montelukast (SINGULAIR) 10 MG tablet Take 10 mg by mouth at bedtime.  11/16/18   [provider]  oxyCODONE-acetaminophen (PERCOCET) 5-325 MG tablet Take 1 tablet by mouth every 6 (six) hours as needed for severe pain. 06/09/19   Rhyne, Hulen Shouts, PA-C  pravastatin (PRAVACHOL) 40 MG tablet TAKE 1 TABLET BY MOUTH EVERY DAY 07/07/19   Kerin Perna, NP  sucroferric oxyhydroxide (VELPHORO) 500 MG chewable tablet Chew 500 mg by mouth 3 (three) times daily with meals.    [provider]   zidovudine (RETROVIR) 300 MG tablet TAKE 1 TABLET (300 MG TOTAL) BY MOUTH DAILY. 08/22/19   Carlyle Basques, MD     Vitals:   09/04/19 2236 09/04/19 2300 09/04/19 2315 09/04/19 2333  BP: (!) 212/111 (!) 152/91 (!) 145/82   Pulse: (!) 113  (!) 106   Resp: (!) 28 (!) 21 20   Temp: (!) 97 F (36.1 C)     SpO2: (!) 0%  100%   Weight:    109.8 kg   Exam Gen heavy set muscular AAM on bipap, alert and calm No rash, cyanosis or gangrene Sclera anicteric, throat clear  +JVD Chest bilat post rales 1/3  up RRR no MRG Abd soft ntnd no mass or ascites +bs +obese GU defer MS no joint effusions or deformity Ext no LE edema, no wounds or ulcers Neuro is alert, Ox 3 , nf R AVF +bruit    Home meds:  - norvasc 10/ carvedilol 25 bid/ doxazosin 8 hs/ hydralazine 100 tid/ lisinopril 10 hs  - pravastatin 40  - velphoro 500 tid ac/ ferric citrate ac tid  - zidovudine 300 qd/ juluca 50-25 qd  - montelukast 10 hs/ duoneb qid prn/ symbicort bid  - bupropion 100 bid/ amitriptyline 25 hs/ oxycodone prn  - prn's/ vitamins/ supplements   CXR 2/14 > IMPRESSION: 1. Cardiomegaly with vascular congestion. 2. Small to moderate size right-sided pleural effusion. 3. Scattered bilateral airspace opacities which may represent multifocal pneumonia or developing pulmonary edema. 4. Chronic pleural thickening along the right chest wall.    Na 137  K 4.6  BUN 93  Cr 15.12   COVID pending   Outpt HD: [facility] MWF   Get records -- from June 2020 >>  Hep 6000+ 3000 midrun, 4.5h AVF      Assessment/ Plan: 1. Resp distress/ hypoxemia - on bipap feeling better, stabilized.  CXR w/ bilat infiltrates, suspect edema, although can't r/o other (bact PNA, covid infection).  COVID is pending. BP's up, +jvd , suspect vol overload.  Plan HD tonight, max UF 3-4 L as tol.   2. ESRD - on HD MWF.  Has not missed HD.  3. HIV infection - on meds 4. HTN - on multiple BP meds 5. Asthma/ COPD - on meds 6. Anemia ckd - Hb 10, no  need for esa or other at this time      Kelly Splinter  MD 09/05/2019, 12:01 AM  Recent Labs  Lab 09/04/19 2223  WBC 11.0*  HGB 10.2*   Recent Labs  Lab 09/04/19 2223  K 4.6  BUN 93*  CREATININE 15.12*  CALCIUM 8.8*

## 2019-09-05 NOTE — Progress Notes (Signed)
Patient resting comfortably on 2L [place] with no respiratory distress noted. BIPAP not needed at this time. RT will monitor as needed. 

## 2019-09-05 NOTE — Progress Notes (Signed)
PROGRESS NOTE   Darrell Johnson  PPJ:[id number]    DOB: July 26, 1965    DOA: 09/04/2019  PCP: Kerin Perna, NP   I have briefly reviewed patients previous medical records in [facility].  Chief Complaint:   Chief Complaint  Patient presents with  . Shortness of Breath    Brief Narrative:  54 year old married male, independent, PMH of hypertension, hyperlipidemia, ESRD on MWF HD, chronic diastolic CHF, tobacco abuse, seizure disorder, HIV infection, gout, asthma/COPD, OSA awaiting his new CPAP machine, presented to the ED on 09/04/2019 due to progressive dyspnea, hypoxia and hypertensive urgency.  BP in the ED up to 212/111.  Claims compliance with medications and dialysis, last dialyzed Friday prior to admission.  Required BiPAP initially.  Nephrology consulted and emergently underwent hemodialysis overnight for ESRD with acute on chronic diastolic CHF/volume overload.  Was placed on IV NTG drip.  Still awaiting pharmacy to do home medication reconciliation prior to starting these.  Went for his scheduled HD this morning.   Assessment & Plan:  Principal Problem:   Acute respiratory failure with hypoxia (HCC) Active Problems:   HIV disease ([place])   Essential hypertension   Chronic pain disorder   Tobacco abuse   ESRD on dialysis ([place])   Hyperlipidemia   GERD (gastroesophageal reflux disease)   Acute on chronic diastolic (congestive) heart failure (HCC)   Acute on chronic respiratory failure with hypoxemia ([place])   1. Acute respiratory failure with hypoxia: Most likely due to acute on chronic diastolic CHF in a patient with ESRD on HD.  Although patient did not miss any dialysis or home medications, hypertensive urgency may have precipitated acute pulmonary edema or his dry weight needs to be brought down.  Briefly on BiPAP overnight.  After dialysis, oxygen via nasal cannula was weaned down from 6 L/min to 2 L/min this morning.  Still with some dyspnea on exertion.  Continue  to wean as tolerated.  COVID-19 and flu testing negative. 2. Acute on chronic diastolic CHF: In context of ESRD.  Volume management across HD.  Had a cycle of HD overnight of admission and for scheduled HD again today.  02/14/2019: LVEF >65% and impaired relaxation. 3. ESRD on MWF: Nephrology consultation appreciated.  I discussed with Dr. Hollie Salk who indicates that he is 3 kg over his regular weight.  Also that his dry weight across dialysis will need to be brought down. 4. Hypertensive urgency: Patient placed on NTG drip from ED.  Unfortunately despite multiple attempts by RN and this MD, patient's home medications have not been reconciled yet by pharmacy.  I discussed with pharmacist now and will await home meds so that these can be resumed and NTG can be weaned off. 5. GERD: If patient on PPIs at home to resume 6. HIV disease: Resume HAART after home medication reconciliation.  Outpatient follow-up with ID. 7. OSA: Reports that he is waiting for his new CPAP machine.  Does not use home oxygen. 8. Chronic pain syndrome: No pain reported by patient this morning. 9. Body mass index is 36.27 kg/m/obesity. 10. Asthma/COPD: No clinical bronchospasm. 11. Anemia in ESRD: Stable 12. Constipation: Initiated bowel regimen.   DVT prophylaxis: Subcutaneous heparin Code Status: Full Family Communication: None at bedside Disposition:  . Patient came from: Home           . Anticipated d/c place: Home . Barriers to d/c: Ongoing dyspnea from decompensated CHF in a dialysis patient and poorly controlled hypertension who still remains on  IV NTG drip.   Consultants:   Nephrology  Procedures:   HD  Antimicrobials:   None   Subjective:  Patient seen this morning prior to HD.  Reports feeling much better.  Dyspnea improved but breathing not yet at baseline.  Dyspnea on mild exertion.  No chest pain or cough.  Compliant with home medications and HD.  Reports he was constipated yesterday, took some  medications for it followed by progressive dyspnea.  Objective:   Vitals:   09/05/19 1400 09/05/19 1430 09/05/19 1500 09/05/19 1521  BP: (!) 107/51 139/76 115/62 112/70  Pulse: (!) 101 97 94 95  Resp:    17  Temp:    98.2 F (36.8 C)  TempSrc:    Oral  SpO2:    100%  Weight:    111.4 kg  Height:        General exam: Young male, moderately built and obese sitting up comfortably at edge of bed this morning. Respiratory system: Occasional basal crackles but otherwise clear to auscultation. Respiratory effort normal. Cardiovascular system: S1 & S2 heard, RRR. No JVD, murmurs, rubs, gallops or clicks. No pedal edema.  Telemetry personally reviewed: SR in the 90s-ST in the 100s. Gastrointestinal system: Abdomen is nondistended, soft and nontender. No organomegaly or masses felt. Normal bowel sounds heard. Central nervous system: Alert and oriented. No focal neurological deficits. Extremities: Symmetric 5 x 5 power.  Right forearm AVF thrill appreciated. Skin: No rashes, lesions or ulcers Psychiatry: Judgement and insight appear normal. Mood & affect flat.     Data Reviewed:   I have personally reviewed following labs and imaging studies   CBC: Recent Labs  Lab 09/04/19 2223 09/05/19 0150 09/05/19 1059  WBC 11.0* 9.8 6.8  NEUTROABS 8.8*  --   --   HGB 10.2* 9.4* 9.6*  HCT 32.1* 29.5* 29.7*  MCV 103.9* 103.1* 105.3*  PLT 248 208 952    Basic Metabolic Panel: Recent Labs  Lab 09/04/19 2223 09/04/19 2223 09/05/19 0150 09/05/19 1059 09/05/19 1229  NA 137  --   --  142 138  K 4.6  --   --  4.3 4.0  CL 93*  --   --  93* 94*  CO2 21*  --   --  26 27  GLUCOSE 158*  --   --  97 92  BUN 93*  --   --  65* 49*  CREATININE 15.12*   < > 15.56* 11.25* 8.57*  CALCIUM 8.8*  --   --  9.2 9.2  PHOS  --   --   --  8.0* 6.4*   < > = values in this interval not displayed.    Liver Function Tests: Recent Labs  Lab 09/04/19 2223 09/05/19 1059 09/05/19 1229  AST 30 24  --    ALT 29 30  --   ALKPHOS 62 62  --   BILITOT 0.9 0.6  --   PROT 7.2 6.2*  --   ALBUMIN 3.6 3.4* 3.6    CBG: Recent Labs  Lab 09/05/19 0520  GLUCAP 87    Microbiology Studies:   Recent Results (from the past 240 hour(s))  Respiratory Panel by RT PCR (Flu A&B, Covid) - Nasopharyngeal Swab     Status: None   Collection Time: 09/04/19 11:54 PM   Specimen: Nasopharyngeal Swab  Result Value Ref Range Status   SARS Coronavirus 2 by RT PCR NEGATIVE NEGATIVE Final    Comment: (NOTE) SARS-CoV-2 target nucleic acids are NOT  DETECTED. The SARS-CoV-2 RNA is generally detectable in upper respiratoy specimens during the acute phase of infection. The lowest concentration of SARS-CoV-2 viral copies this assay can detect is 131 copies/mL. A negative result does not preclude SARS-Cov-2 infection and should not be used as the sole basis for treatment or other patient management decisions. A negative result may occur with  improper specimen collection/handling, submission of specimen other than nasopharyngeal swab, presence of viral mutation(s) within the areas targeted by this assay, and inadequate number of viral copies (<131 copies/mL). A negative result must be combined with clinical observations, patient history, and epidemiological information. The expected result is Negative. Fact Sheet for Patients:  PinkCheek.be Fact Sheet for Healthcare Providers:  GravelBags.it This test is not yet ap proved or cleared by the [place] FDA and  has been authorized for detection and/or diagnosis of SARS-CoV-2 by FDA under an Emergency Use Authorization (EUA). This EUA will remain  in effect (meaning this test can be used) for the duration of the COVID-19 declaration under Section 564(b)(1) of the Act, 21 U.S.C. section 360bbb-3(b)(1), unless the authorization is terminated or revoked sooner.    Influenza A by PCR NEGATIVE NEGATIVE  Final   Influenza B by PCR NEGATIVE NEGATIVE Final    Comment: (NOTE) The Xpert Xpress SARS-CoV-2/FLU/RSV assay is intended as an aid in  the diagnosis of influenza from Nasopharyngeal swab specimens and  should not be used as a sole basis for treatment. Nasal washings and  aspirates are unacceptable for Xpert Xpress SARS-CoV-2/FLU/RSV  testing. Fact Sheet for Patients: PinkCheek.be Fact Sheet for Healthcare Providers: GravelBags.it This test is not yet approved or cleared by the [place] FDA and  has been authorized for detection and/or diagnosis of SARS-CoV-2 by  FDA under an Emergency Use Authorization (EUA). This EUA will remain  in effect (meaning this test can be used) for the duration of the  Covid-19 declaration under Section 564(b)(1) of the Act, 21  U.S.C. section 360bbb-3(b)(1), unless the authorization is  terminated or revoked. Performed at [facility], [place] [address], [place], [name] [address]      Radiology Studies:  DG Chest Port 1 View  Result Date: 09/04/2019 CLINICAL DATA:  Shortness of breath EXAM: PORTABLE CHEST 1 VIEW COMPARISON:  02/20/2019 FINDINGS: There is scattered bilateral airspace opacities, greatest in the peripheral right lung zone. There is chronic stable pleural thickening along the right chest wall. There is chronic blunting of the right costophrenic angle with a new small to moderate size right-sided pleural effusion. There is a retrocardiac opacity. There is cardiomegaly with vascular congestion. There is no pneumothorax. IMPRESSION: 1. Cardiomegaly with vascular congestion. 2. Small to moderate size right-sided pleural effusion. 3. Scattered bilateral airspace opacities which may represent multifocal pneumonia or developing pulmonary edema. 4. Chronic pleural thickening along the right chest wall. Electronically Signed   By: Constance Holster M.D.   On: 09/04/2019 22:47      Scheduled Meds:   . calcium carbonate      . Chlorhexidine Gluconate Cloth  6 each Topical Q0600  . heparin  5,000 Units Subcutaneous Q8H    Continuous Infusions:   . nitroGLYCERIN       LOS: 0 days     Vernell Leep, MD, [place], [facility]. [facility]    To contact the attending provider between 7A-7P or the covering provider during after hours 7P-7A, please log into the web site [email or web address] and access using universal [place] password for that web  site. If you do not have the password, please call the hospital operator.  09/05/2019, 4:17 PM

## 2019-09-05 NOTE — Progress Notes (Signed)
Pt HR 170-190s, pt resting in bed, no complaints. EKG performed, pt in A.Fib RVR. Dr. Algis Liming notified, PO carvedilol 25mg  ordered, and given.

## 2019-09-05 NOTE — H&P (Signed)
History and Physical   Darrell Johnson:[id number] DOB: September 28, 1965 DOA: 09/04/2019  Referring MD/NP/PA: Dr. Betsey Holiday  PCP: Kerin Perna, NP   Outpatient Specialists: [facility]  Patient coming from: Home  Chief Complaint: Shortness of breath  HPI: Darrell Johnson is a 54 y.o. adult with medical history significant of end-stage renal disease on hemodialysis on Mondays Wednesdays and Fridays, diastolic dysfunction CHF, tobacco abuse, HIV infection, seizure disorder, hyperlipidemia and asthma who did not miss hemodialysis apparently but came in with significant fluid overload, hypertensive urgency hypoxia requiring BiPAP as well as generally unwell.  Patient has chronic pain and hyperlipidemia.  He has been taking all his medications.  Patient seen and evaluated in the ER.  At this point he appears to be fluid overloaded.  Patient will require emergent hemodialysis tonight.  In the meantime he is being admitted to the hospital for monitoring.  He remains on BiPAP due to hypoxia from the fluid overload..  ED Course: Temperature is 97 blood pressure 212/111 pulse 130 respirate 28 oxygen sats 90% on room air.  White count 11.0 hemoglobin 10.2 and platelet count of 248.  Chemistry showed a CO2 of 21 BUN 93 creatinine 15 and calcium 8.8 glucose 158.  Troponin of 61.  Chest x-ray showed cardiomegaly with vascular congestion.  Small to moderate-sized right-sided pleural effusion with scattered bilateral airspace opacities possibly multifocal pneumonia or pulmonary edema.  COVID-19 screen result is currently pending  Review of Systems: As per HPI otherwise 10 point review of systems negative.    Past Medical History:  Diagnosis Date  . Anemia   . Anxiety   . Arthritis    "knees" (04/13/2017)  . Asthma   . CHF (congestive heart failure) ([place])   . ESRD (end stage renal disease) on dialysis [facility])    "MWF; [facility]" (04/13/2017)  . Gout, unspecified 08/13/2009   Qualifier:  Diagnosis of  By: Redmond Pulling  MD, Mateo Flow    . H1N1 influenza    March 2016  . History of blood transfusion 02/2017   "related to OR"  . HIV infection ([place]) 1980's   on ART therapy since, followed by ID clinic, complicated  by neuropathy  . Hyperlipidemia    hypertrygliceridemia determined ti be secondary to ART therpay  . HYPERTENSION 05/08/2006  . Pneumonia    "once; years ago" (04/13/2017)  . Rib fractures 01/2009  . Seizures ([place])    last seizure was in the 1990s, pt has family history of seizures; "probably related to alcohol" (04/13/2017)  . 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 Syphilis 1997   history of syphilis 1997  . Tobacco abuse     Past Surgical History:  Procedure Laterality Date  . AV FISTULA PLACEMENT Right 12/02/2013   Procedure: ARTERIOVENOUS (AV) FISTULA CREATION;  Surgeon: Rosetta Posner, MD;  Location: [place];  Service: Vascular;  Laterality: Right;  . AV FISTULA PLACEMENT Right 06/09/2019   Procedure: Right Arm Fistula Plication.;  Surgeon: Marty Heck, MD;  Location: [place];  Service: Vascular;  Laterality: Right;  . INCISION AND DRAINAGE ABSCESS Right 03/09/2017   Procedure: INCISION AND DRAINAGE Right Scrotal Abscess;  Surgeon: Franchot Gallo, MD;  Location: [place];  Service: Urology;  Laterality: Right;  . THROMBECTOMY Right ~ 2016   "AV fistula clotted off"  reports that he quit smoking about 8 months ago. His smoking use included cigarettes. He has a 2.50 pack-year smoking history. He has never used smokeless tobacco. He reports that he does not drink alcohol or use drugs.  No Known Allergies  Family History  Problem Relation Age of Onset  . Cancer Mother   . Hypertension Mother   . COPD Father   . Hypertension Father   . Diabetes Sister   . Hypertension Sister   . Diabetes Brother   .  Hypertension Brother   . Stroke Neg Hx      Prior to Admission medications   Medication Sig Start Date End Date Taking? Authorizing Provider  albuterol (PROVENTIL HFA;VENTOLIN HFA) 108 (90 Base) MCG/ACT inhaler Inhale 2 puffs into the lungs every 4 (four) hours as needed for up to 30 days for wheezing or shortness of breath. 09/21/18 06/09/19  Kerin Perna, NP  amitriptyline (ELAVIL) 25 MG tablet TAKE 1 TABLET BY MOUTH EVERYDAY AT BEDTIME Patient not taking: No sig reported 02/10/19   Carlyle Basques, MD  amLODipine (NORVASC) 10 MG tablet Take 1 tablet (10 mg total) by mouth daily. Patient taking differently: Take 10 mg by mouth at bedtime.  09/21/18   Kerin Perna, NP  azelastine (OPTIVAR) 0.05 % ophthalmic solution Place 1 drop into both eyes daily.  12/24/18   [provider]  budesonide-formoterol (SYMBICORT) 160-4.5 MCG/ACT inhaler Inhale 2 puffs into the lungs 2 (two) times daily. 09/21/18   Kerin Perna, NP  buPROPion (WELLBUTRIN SR) 100 MG 12 hr tablet TAKE 1 TABLET BY MOUTH TWICE A DAY Patient not taking: No sig reported 02/10/19   Carlyle Basques, MD  carvedilol (COREG) 25 MG tablet Take 1 tablet (25 mg total) by mouth 2 (two) times daily with a meal. 09/21/18   Kerin Perna, NP  doxazosin (CARDURA) 8 MG tablet Take 1 tablet (8 mg total) by mouth at bedtime. 09/21/18   Kerin Perna, NP  ferric citrate (AURYXIA) 1 GM 210 MG(Fe) tablet Take 420 mg by mouth 3 (three) times daily with meals.    [provider]  fluticasone (FLONASE) 50 MCG/ACT nasal spray Place 1 spray into both nostrils as needed for allergies.  09/23/18   [provider]  hydrALAZINE (APRESOLINE) 100 MG tablet Take 100 mg by mouth 3 (three) times daily.  12/01/18   [provider]  ipratropium-albuterol (DUONEB) 0.5-2.5 (3) MG/3ML SOLN Take 3 mLs by nebulization every 6 (six) hours as needed. Patient taking differently: Take 3 mLs by nebulization every 6 (six) hours  as needed (shortness of breath and wheezing).  09/21/18   Kerin Perna, NP  JULUCA 50-25 MG TABS TAKE 1 TABLET BY MOUTH DAILY WITH SUPPER. 08/22/19   Carlyle Basques, MD  lisinopril (ZESTRIL) 10 MG tablet Take 10 mg by mouth at bedtime.  09/06/18   [provider]  montelukast (SINGULAIR) 10 MG tablet Take 10 mg by mouth at bedtime.  11/16/18   [provider]  oxyCODONE-acetaminophen (PERCOCET) 5-325 MG tablet Take 1 tablet by mouth every 6 (six) hours as needed for severe pain. 06/09/19   Rhyne, Hulen Shouts, PA-C  pravastatin (PRAVACHOL) 40 MG tablet TAKE 1 TABLET BY MOUTH EVERY DAY 07/07/19   Kerin Perna, NP  sucroferric oxyhydroxide (VELPHORO) 500 MG chewable tablet Chew 500 mg by mouth 3 (three) times daily with meals.    [provider]  zidovudine (RETROVIR) 300 MG tablet TAKE 1 TABLET (300 MG  TOTAL) BY MOUTH DAILY. 08/22/19   Carlyle Basques, MD    Physical Exam: Vitals:   09/04/19 2236 09/04/19 2300 09/04/19 2315 09/04/19 2333  BP: (!) 212/111 (!) 152/91 (!) 145/82   Pulse: (!) 113  (!) 106   Resp: (!) 28 (!) 21 20   Temp: (!) 97 F (36.1 C)     SpO2: (!) 0%  100%   Weight:    109.8 kg      Constitutional: Anxious, in obvious respiratory distress, requiring BiPAP Vitals:   09/04/19 2236 09/04/19 2300 09/04/19 2315 09/04/19 2333  BP: (!) 212/111 (!) 152/91 (!) 145/82   Pulse: (!) 113  (!) 106   Resp: (!) 28 (!) 21 20   Temp: (!) 97 F (36.1 C)     SpO2: (!) 0%  100%   Weight:    109.8 kg   Eyes: PERRL, lids and conjunctivae normal ENMT: Mucous membranes are moist. Posterior pharynx clear of any exudate or lesions.Normal dentition.  Neck: normal, supple, no masses, no thyromegaly Respiratory: Decreased air entry bilaterally with obvious respiratory distress, bilateral rhonchi and crackles, use of extra muscle respiration Cardiovascular: Sinus tachycardia no murmurs / rubs / gallops. No extremity edema. 2+ pedal pulses. No carotid bruits.    Abdomen: no tenderness, no masses palpated. No hepatosplenomegaly. Bowel sounds positive.  Musculoskeletal: no clubbing / cyanosis. No joint deformity upper and lower extremities. Good ROM, no contractures. Normal muscle tone.  Skin: no rashes, lesions, ulcers. No induration Neurologic: CN 2-12 grossly intact. Sensation intact, DTR normal. Strength 5/5 in all 4.  Psychiatric: On BiPAP, appears slightly confused    Labs on Admission: I have personally reviewed following labs and imaging studies  CBC: Recent Labs  Lab 09/04/19 2223  WBC 11.0*  NEUTROABS 8.8*  HGB 10.2*  HCT 32.1*  MCV 103.9*  PLT 174   Basic Metabolic Panel: Recent Labs  Lab 09/04/19 2223  NA 137  K 4.6  CL 93*  CO2 21*  GLUCOSE 158*  BUN 93*  CREATININE 15.12*  CALCIUM 8.8*   GFR: CrCl cannot be calculated (Unknown ideal weight.). Liver Function Tests: Recent Labs  Lab 09/04/19 2223  AST 30  ALT 29  ALKPHOS 62  BILITOT 0.9  PROT 7.2  ALBUMIN 3.6   No results for input(s): LIPASE, AMYLASE in the last 168 hours. No results for input(s): AMMONIA in the last 168 hours. Coagulation Profile: No results for input(s): INR, PROTIME in the last 168 hours. Cardiac Enzymes: No results for input(s): CKTOTAL, CKMB, CKMBINDEX, TROPONINI in the last 168 hours. BNP (last 3 results) No results for input(s): PROBNP in the last 8760 hours. HbA1C: No results for input(s): HGBA1C in the last 72 hours. CBG: No results for input(s): GLUCAP in the last 168 hours. Lipid Profile: No results for input(s): CHOL, HDL, LDLCALC, TRIG, CHOLHDL, LDLDIRECT in the last 72 hours. Thyroid Function Tests: No results for input(s): TSH, T4TOTAL, FREET4, T3FREE, THYROIDAB in the last 72 hours. Anemia Panel: No results for input(s): VITAMINB12, FOLATE, FERRITIN, TIBC, IRON, RETICCTPCT in the last 72 hours. Urine analysis:    Component Value Date/Time   COLORURINE YELLOW 07/06/2013 1808   APPEARANCEUR CLEAR 07/06/2013 1808    LABSPEC 1.013 07/06/2013 1808   PHURINE 7.5 07/06/2013 1808   GLUCOSEU NEGATIVE 07/06/2013 1808   GLUCOSEU NEG mg/dL 11/01/2009 2123   HGBUR SMALL (A) 07/06/2013 1808   BILIRUBINUR NEGATIVE 07/06/2013 1808   KETONESUR NEGATIVE 07/06/2013 1808   PROTEINUR >300 (A) 07/06/2013 1808  UROBILINOGEN 0.2 07/06/2013 1808   NITRITE NEGATIVE 07/06/2013 1808   LEUKOCYTESUR NEGATIVE 07/06/2013 1808   Sepsis Labs: @LABRCNTIP (procalcitonin:4,lacticidven:4) )No results found for this or any previous visit (from the past 240 hour(s)).   Radiological Exams on Admission: DG Chest Port 1 View  Result Date: 09/04/2019 CLINICAL DATA:  Shortness of breath EXAM: PORTABLE CHEST 1 VIEW COMPARISON:  02/20/2019 FINDINGS: There is scattered bilateral airspace opacities, greatest in the peripheral right lung zone. There is chronic stable pleural thickening along the right chest wall. There is chronic blunting of the right costophrenic angle with a new small to moderate size right-sided pleural effusion. There is a retrocardiac opacity. There is cardiomegaly with vascular congestion. There is no pneumothorax. IMPRESSION: 1. Cardiomegaly with vascular congestion. 2. Small to moderate size right-sided pleural effusion. 3. Scattered bilateral airspace opacities which may represent multifocal pneumonia or developing pulmonary edema. 4. Chronic pleural thickening along the right chest wall. Electronically Signed   By: Constance Holster M.D.   On: 09/04/2019 22:47    EKG: Independently reviewed.  Acute sinus tachycardia with a rate of 124 with evidence of biatrial enlargement.  Assessment/Plan Principal Problem:   Acute respiratory failure with hypoxia (HCC) Active Problems:   HIV disease ([place])   Essential hypertension   Chronic pain disorder   Tobacco abuse   ESRD on dialysis ([place])   Hyperlipidemia   GERD (gastroesophageal reflux disease)   Acute on chronic diastolic (congestive) heart failure (HCC)   Acute on  chronic respiratory failure with hypoxemia (HCC)     #1 acute respiratory failure with hypoxemia: Secondary to fluid overload from end-stage renal disease and congestive heart failure.  Patient will require emergent hemodialysis.  Currently on BiPAP will continue until titrated off.  On nitroglycerin drip also.  Continue to monitor closely  #2 end-stage renal disease: On hemodialysis Mondays Wednesdays and Fridays.  Emergent hemodialysis today.  #3 hypertensive urgency: Patient currently on nitroglycerin drip.  We will continue until titrated.  #4 GERD: Continue with PPIs  #5 HIV disease: Currently on HAART.  CD4 count better and viral load appears to be low last month.  Continue per ID outpatient  #6 chronic pain syndrome: Continue home regimen when confirmed by pharmacy   DVT prophylaxis: Heparin Code Status: Full code Family Communication: No family at bedside discussed care with patient Disposition Plan: Home Consults called: Dr. Harvin Hazel, nephrology Admission status: Inpatient to progressive care  Severity of Illness: The appropriate patient status for this patient is INPATIENT. Inpatient status is judged to be reasonable and necessary in order to provide the required intensity of service to ensure the patient's safety. The patient's presenting symptoms, physical exam findings, and initial radiographic and laboratory data in the context of their chronic comorbidities is felt to place them at high risk for further clinical deterioration. Furthermore, it is not anticipated that the patient will be medically stable for discharge from the hospital within 2 midnights of admission. The following factors support the patient status of inpatient.   " The patient's presenting symptoms include shortness of breath. " The worrisome physical exam findings include hypoxemia with respiratory distress. " The initial radiographic and laboratory data are worrisome because of chest x-ray and EKG  findings. " The chronic co-morbidities include end-stage renal disease.   * I certify that at the point of admission it is my clinical judgment that the patient will require inpatient hospital care spanning beyond 2 midnights from the point of admission due to high intensity of service,  high risk for further deterioration and high frequency of surveillance required.Barbette Merino MD [facility] Pager [phone]  If 7PM-7AM, please contact night-coverage [email or web address] Password [facility]  09/05/2019, 12:07 AM

## 2019-09-05 NOTE — ED Notes (Signed)
The pt has a negative covid dialysis is ready for the pt

## 2019-09-05 NOTE — Significant Event (Signed)
Rapid Response Event Note  Overview: Cardiac - AF RVR  Initial Focused Assessment: Called to see patient with HR in the 170-200s - AF RVR. RN had just given Lopressor PO a few minutes. Upon arrival, patient was alert and oriented. Not in acute distress, he endorsed that he felt that his heart was racing but denied SOB/CP, skin warm and dry. SBP in the 160s on NTG infusion at 81mcg/kg/min (per nurse, for hypertensive urgency). HR 180-210s - fast AF. 98% on 2L [place] - normal respiratory effort. RN paged MD, orders received for Lopressor 2.5 mg IV x 1. HR improved briefly but patient was having some back spasms (not new, has a history of this - uses mustard at home) so HR climbed back up in the 170s and then improved again in the 120-140s.  Interventions: -- Lopressor 2.5 MG IV x 1 -- NTG drip stopped  Plan of Care:  --  Monitor HR and BP  -- I called the RN at 1900 for update, she gave another dose of Lopressor 2.5 mg IV  Event Summary:  Call Time 1752 Arrival Time 1610 End Time 1825  Sydell Prowell R

## 2019-09-05 NOTE — Progress Notes (Signed)
Pt received from dialysis, AO x4. Report received per dialysis RN Tawanna Solo at [phone]. Breathing even and unlabored non-rebreather, sat 100%. RT had taken pt off bi-pap. Bi-pap by bedside if pt needs it. CHG bath completed, connected to tele, CCMD notified. Oriented pt to room and call bell system. Pt denies any pain at the moment. Pt switched to 6l o2 via [place], sating 100% and slowly weaned down to 5l via [place] at 0550. o2 sat 100%. No SOB. Call bell within reach. Will continue to monitor.

## 2019-09-06 LAB — HEPARIN LEVEL (UNFRACTIONATED)
Heparin Unfractionated: 0.18 IU/mL — ABNORMAL LOW (ref 0.30–0.70)
Heparin Unfractionated: 0.14 IU/mL — ABNORMAL LOW (ref 0.30–0.70)
Heparin Unfractionated: 0.35 IU/mL (ref 0.30–0.70)

## 2019-09-06 LAB — CBC
HCT: 30 % — ABNORMAL LOW (ref 39.0–52.0)
Hemoglobin: 9.8 g/dL — ABNORMAL LOW (ref 13.0–17.0)
MCH: 33.3 pg (ref 26.0–34.0)
MCHC: 32.7 g/dL (ref 30.0–36.0)
MCV: 102 fL — ABNORMAL HIGH (ref 80.0–100.0)
Platelets: 208 10*3/uL (ref 150–400)
RBC: 2.94 MIL/uL — ABNORMAL LOW (ref 4.22–5.81)
RDW: 14.9 % (ref 11.5–15.5)
WBC: 6.6 10*3/uL (ref 4.0–10.5)
nRBC: 0.3 % — ABNORMAL HIGH (ref 0.0–0.2)

## 2019-09-06 LAB — HEPATITIS B SURFACE ANTIBODY, QUANTITATIVE: Hep B S AB Quant (Post): 28.8 m[IU]/mL (ref >9.9)

## 2019-09-06 LAB — TSH: TSH: <0.01 u[IU]/mL — ABNORMAL LOW (ref 0.350–4.500)

## 2019-09-06 LAB — HEPATITIS B E ANTIGEN: Hep B E Ag: NEGATIVE

## 2019-09-06 MED ORDER — HEPARIN BOLUS VIA INFUSION
2000.0000 [IU] | Freq: Once | INTRAVENOUS | Status: AC
  Administered 2019-09-06: 2000 [IU] via INTRAVENOUS
  Filled 2019-09-06: qty 2000

## 2019-09-06 MED ORDER — DILTIAZEM HCL-DEXTROSE 125-5 MG/125ML-% IV SOLN (PREMIX)
5.0000 mg/h | INTRAVENOUS | Status: DC
  Administered 2019-09-06: 5 mg/h via INTRAVENOUS
  Filled 2019-09-06: qty 125

## 2019-09-06 MED ORDER — DILTIAZEM LOAD VIA INFUSION
10.0000 mg | Freq: Once | INTRAVENOUS | Status: AC
  Administered 2019-09-06: 10 mg via INTRAVENOUS
  Filled 2019-09-06: qty 10

## 2019-09-06 MED ORDER — CHLORHEXIDINE GLUCONATE CLOTH 2 % EX PADS
6.0000 | MEDICATED_PAD | Freq: Every day | CUTANEOUS | Status: DC
  Administered 2019-09-06: 6 via TOPICAL

## 2019-09-06 MED ORDER — HEPARIN BOLUS VIA INFUSION
2800.0000 [IU] | Freq: Once | INTRAVENOUS | Status: AC
  Administered 2019-09-06: 2800 [IU] via INTRAVENOUS
  Filled 2019-09-06: qty 2800

## 2019-09-06 NOTE — Progress Notes (Signed)
ANTICOAGULATION CONSULT NOTE  Pharmacy Consult for Heparin Indication: atrial fibrillation  No Known Allergies  Patient Measurements: Height: 5\' 9"  (175.3 cm) Weight: 247 lb 8 oz (112.3 kg) IBW/kg (Calculated) : 70.7 Heparin Dosing Weight: 96.1 kg  Vital Signs: Temp: 98.5 F (36.9 C) (02/16 1154) Temp Source: Oral (02/16 0803) BP: 126/64 (02/16 1046) Pulse Rate: 86 (02/16 1046)  Labs: Recent Labs    09/04/19 2223 09/04/19 2223 09/05/19 0150 09/05/19 0150 09/05/19 1059 09/05/19 1229 09/06/19 0349 09/06/19 1255  HGB 10.2*   < > 9.4*   < > 9.6*  --  9.8*  --   HCT 32.1*   < > 29.5*  --  29.7*  --  30.0*  --   PLT 248   < > 208  --  200  --  208  --   HEPARINUNFRC  --   --   --   --   --   --  0.18* 0.14*  CREATININE 15.12*   < > 15.56*  --  11.25* 8.57*  --   --   TROPONINIHS 61*  --  172*  --   --   --   --   --    < > = values in this interval not displayed.    Estimated Creatinine Clearance (by C-G formula based on SCr of 8.57 mg/dL (H)) Male: 10 mL/min (A) Male: 12.2 mL/min (A)   Assessment: 54 yo male found to be in Afib with RVR and started on IV heparin.  Heparin level is sub-therapeutic.  No issue with heparin infusion no bleeding per RN.  Goal of Therapy:  Heparin level 0.3-0.7 units/ml Monitor platelets by anticoagulation protocol: Yes   Plan:  Rebolus with heparin 2800 units IV, then Increase heparin gtt to 1850 units/hr Check 6 hr heparin level Daily heparin level and CBC  Emerald Gehres D. Mina Marble, PharmD, BCPS, [place] 09/06/2019, 2:27 PM

## 2019-09-06 NOTE — Progress Notes (Signed)
   [facility] Progress Note   Assessment/ Plan:    Dialysis orders: MWF GOC 4 hrs 30 min EDW 111.5 F200 dialyzer BFR 475  2K 2 Ca bath UF Profile 2 Parsabiv 10 q rx Heparin 6000 bolus and 3000 mid run Mircera 100 q 2 weeks Calcitriol 3.75 q rx  1. Resp distress/ hypoxemia -resolves s/p serial HD.   2. ESRD - on HD MWF.  Has not missed HD.  Will try to challenge next rx, planned for 2/17 3. Afib with RVR- new, on dilt gtt and heparin gtt now.  TSH < 0.010 4. Hyperthyroidism: new dx, per primary, TSH < 0.010 5. HIV infection - on meds 6. HTN/ vol -much better with volume removal and dilt gtt, off nitro gtt now 7. Asthma/ COPD - on meds 8. Anemia ckd - Watch and ESA as needed 9. Dispo: pending  Subjective:    Extra HD yesterday, did well, off O2 this AM and BP is better.  Afib with RVR, on dilt and hep gtts now.     Objective:   BP 126/64 (BP Location: Left Arm)   Pulse 86   Temp 98.4 F (36.9 C) (Oral)   Resp 15   Ht 5\' 9"  (1.753 m)   Wt 112.3 kg   SpO2 98%   BMI 36.55 kg/m   Physical Exam: Gen: sitting on edge of bed, NAD CVS:  RRR no S3 present Resp: clear bilaterally no c/w/r Abd: soft, nontender NABS Ext: no real LE edema  Labs: BMET Recent Labs  Lab 09/04/19 2223 09/05/19 0150 09/05/19 1059 09/05/19 1229  NA 137  --  142 138  K 4.6  --  4.3 4.0  CL 93*  --  93* 94*  CO2 21*  --  26 27  GLUCOSE 158*  --  97 92  BUN 93*  --  65* 49*  CREATININE 15.12* 15.56* 11.25* 8.57*  CALCIUM 8.8*  --  9.2 9.2  PHOS  --   --  8.0* 6.4*   CBC Recent Labs  Lab 09/04/19 2223 09/05/19 0150 09/05/19 1059 09/06/19 0349  WBC 11.0* 9.8 6.8 6.6  NEUTROABS 8.8*  --   --   --   HGB 10.2* 9.4* 9.6* 9.8*  HCT 32.1* 29.5* 29.7* 30.0*  MCV 103.9* 103.1* 105.3* 102.0*  PLT 248 208 200 208      Medications:    . amitriptyline  25 mg Oral QHS  . azelastine  1 spray Each Nare Daily   And  . fluticasone  1 spray Each Nare Daily  . carvedilol  25  mg Oral BID WC  . Chlorhexidine Gluconate Cloth  6 each Topical Q0600  . dolutegravir  50 mg Oral Q breakfast   And  . rilpivirine  25 mg Oral Q breakfast  . doxazosin  8 mg Oral QHS  . ferric citrate  420 mg Oral TID WC   And  . ferric citrate  210 mg Oral With snacks  . lisinopril  10 mg Oral QHS  . mometasone-formoterol  2 puff Inhalation BID  . montelukast  10 mg Oral QHS  . polyethylene glycol  17 g Oral Daily  . pravastatin  40 mg Oral QHS  . senna  1 tablet Oral Daily  . zidovudine  300 mg Oral Daily     Madelon Lips, MD 09/06/2019, 11:05 AM

## 2019-09-06 NOTE — Progress Notes (Signed)
ANTICOAGULATION CONSULT NOTE  Pharmacy Consult for Heparin Indication: atrial fibrillation  No Known Allergies  Patient Measurements: Height: 5\' 9"  (175.3 cm) Weight: 247 lb 8 oz (112.3 kg) IBW/kg (Calculated) : 70.7 Heparin Dosing Weight: 96.1 kg  Vital Signs: Temp: 98.7 F (37.1 C) (02/16 2038) Temp Source: Oral (02/16 2038) BP: 144/66 (02/16 2038) Pulse Rate: 88 (02/16 2038)  Labs: Recent Labs    09/04/19 2223 09/04/19 2223 09/05/19 0150 09/05/19 0150 09/05/19 1059 09/05/19 1229 09/06/19 0349 09/06/19 1255 09/06/19 2150  HGB 10.2*   < > 9.4*   < > 9.6*  --  9.8*  --   --   HCT 32.1*   < > 29.5*  --  29.7*  --  30.0*  --   --   PLT 248   < > 208  --  200  --  208  --   --   HEPARINUNFRC  --   --   --   --   --   --  0.18* 0.14* 0.35  CREATININE 15.12*   < > 15.56*  --  11.25* 8.57*  --   --   --   TROPONINIHS 61*  --  172*  --   --   --   --   --   --    < > = values in this interval not displayed.    Estimated Creatinine Clearance (by C-G formula based on SCr of 8.57 mg/dL (H)) Male: 10 mL/min (A) Male: 12.2 mL/min (A)   Assessment: 54 yo male found to be in Afib with RVR and started on IV heparin. Patient is not on anticoagulation PTA. Heparin level now therapeutic at 0.35. CBC stable. No active bleed issues documented.  Goal of Therapy:  Heparin level 0.3-0.7 units/ml Monitor platelets by anticoagulation protocol: Yes   Plan:  Continue heparin IV at 1850 units/hr Confirmatory heparin level with AM labs Monitor daily CBC, s/sx bleeding   Elicia Lamp, PharmD, BCPS Please check AMION for all [place] contact numbers Clinical Pharmacist 09/06/2019 10:27 PM

## 2019-09-06 NOTE — Progress Notes (Addendum)
Made K Kirby aware patient returned to a heart rate higher than 160. EKG order received and carried out. Will await further instruction.  Patient is asleep and not showing any signs or symptoms of distress.

## 2019-09-06 NOTE — Progress Notes (Signed)
RT placed patient on BIPAP due to episodes of apnea. Patient is tolerating well at this time. RT will monitor as needed.

## 2019-09-06 NOTE — Progress Notes (Signed)
Patient is on BiPAP, bed scale is broken, will try before change of shift to get weight again. Patient wants to sleep.

## 2019-09-06 NOTE — Progress Notes (Signed)
PROGRESS NOTE    Darrell Johnson  CZY:[id number] DOB: [birth year] DOA: 09/04/2019 PCP: Kerin Perna, NP   Brief Narrative:  54 year old with history of HTN, HLD, ESRD Monday Wednesday Friday, chronic D CHF, HIV, seizure disorder, tobacco disorder, asthma, obstructive sleep apnea, COPD still awaiting new CPAP machine came to the ED on 2/14 with progressive dyspnea, hypoxia and hypertensive urgency.  Initially placed on BiPAP was taken for hemodialysis urgently by nephrology team.  Hospital course complicated by atrial fibrillation with RVR started on IV heparin and Cardizem drip along with home p.o. Coreg.   Assessment & Plan:   Principal Problem:   Acute respiratory failure with hypoxia (HCC) Active Problems:   HIV disease ([place])   Essential hypertension   Chronic pain disorder   Tobacco abuse   ESRD on dialysis ([place])   Hyperlipidemia   GERD (gastroesophageal reflux disease)   Acute on chronic diastolic (congestive) heart failure (HCC)   Acute on chronic respiratory failure with hypoxemia (HCC)   Acute hypoxic respiratory failure secondary to fluid overload Acute congestive heart failure with preserved ejection fraction, ejection fraction 65% ESRD hemodialysis MWF -Wean off oxygen as necessary, currently on 2 L nasal cannula saturating greater than 95%.  Advised nursing staff to take this off. COVID-19-negative -Nephrology team following-dialysis at their discretion. -Supportive care, monitor electrolytes. -Daily weights.  Atrial fibrillation with RVR; CHADsVASC- Min of 2.  -Continue Coreg 25 mg twice daily.  Rate is better controlled therefore will stop Cardizem drip and monitor -Continue heparin drip.  At this time not interested in long-term anticoagulation, he can weigh risk and benefit with his PCP but will continue to monitor. -Check TSH  Essential hypertension, uncontrolled -Hypertensive urgency, improved -We will resume home medications.  Nitroglycerin drip  weaned off.  On Cardizem drip. -Lisinopril 10 mg at bedtime.  Coreg 25 mg twice daily.  GERD -PPI  Obstructive sleep apnea History of COPD/asthma -Currently awaiting for new CPAP machine at home. -As needed bronchodilators  Anemia of chronic disease -Hemoglobin stable.  Currently 9.8.  History of HIV Chronic pain syndrome -On HAARTmedications   DVT prophylaxis: Heparin drip Code Status: Code Family Communication: None Disposition Plan:   Patient From= home  Patient Anticipated D/C place= Home  Barriers= currently patient is on Cardizem drip and heparin drip, planning on turning this off today and monitoring him on p.o. medication.  If continues to remain in normal sinus rhythm on p.o. medications, he can be discharged tomorrow.   Subjective: Denies any complaints this morning.  Overnight required Cardizem drip for atrial fibrillation with RVR.  This morning rate is better controlled.  Review of Systems Otherwise negative except as per HPI, including: General: Denies fever, chills, night sweats or unintended weight loss. Resp: Denies cough, wheezing, shortness of breath. Cardiac: Denies chest pain, palpitations, orthopnea, paroxysmal nocturnal dyspnea. GI: Denies abdominal pain, nausea, vomiting, diarrhea or constipation GU: Denies dysuria, frequency, hesitancy or incontinence MS: Denies muscle aches, joint pain or swelling Neuro: Denies headache, neurologic deficits (focal weakness, numbness, tingling), abnormal gait Psych: Denies anxiety, depression, SI/HI/AVH Skin: Denies new rashes or lesions ID: Denies sick contacts, exotic exposures, travel  Examination:  General exam: Appears calm and comfortable  Respiratory system: Some bibasilar rhonchi Cardiovascular system: S1 & S2 heard, RRR. No JVD, murmurs, rubs, gallops or clicks. No pedal edema. Gastrointestinal system: Abdomen is nondistended, soft and nontender. No organomegaly or masses felt. Normal bowel sounds  heard. Central nervous system: Alert and oriented. No focal neurological deficits.  Extremities: Symmetric 5 x 5 power. Skin: No rashes, lesions or ulcers Psychiatry: Judgement and insight appear normal. Mood & affect appropriate.     Objective: Vitals:   09/06/19 0326 09/06/19 0345 09/06/19 0508 09/06/19 0747  BP: 114/87 (!) 152/80 128/62   Pulse: (!) 161 (!) 165 84   Resp: 16 16 14    Temp:      TempSrc:      SpO2: 97% 97% 98% 96%  Weight:      Height:        Intake/Output Summary (Last 24 hours) at 09/06/2019 0748 Last data filed at 09/05/2019 1521 Gross per 24 hour  Intake --  Output 3365 ml  Net -3365 ml   Filed Weights   09/05/19 0514 09/05/19 1135 09/05/19 1521  Weight: 114.1 kg 115.1 kg 111.4 kg     Data Reviewed:   CBC: Recent Labs  Lab 09/04/19 2223 09/05/19 0150 09/05/19 1059 09/06/19 0349  WBC 11.0* 9.8 6.8 6.6  NEUTROABS 8.8*  --   --   --   HGB 10.2* 9.4* 9.6* 9.8*  HCT 32.1* 29.5* 29.7* 30.0*  MCV 103.9* 103.1* 105.3* 102.0*  PLT 248 208 200 419   Basic Metabolic Panel: Recent Labs  Lab 09/04/19 2223 09/05/19 0150 09/05/19 1059 09/05/19 1229  NA 137  --  142 138  K 4.6  --  4.3 4.0  CL 93*  --  93* 94*  CO2 21*  --  26 27  GLUCOSE 158*  --  97 92  BUN 93*  --  65* 49*  CREATININE 15.12* 15.56* 11.25* 8.57*  CALCIUM 8.8*  --  9.2 9.2  PHOS  --   --  8.0* 6.4*   GFR: Estimated Creatinine Clearance (by C-G formula based on SCr of 8.57 mg/dL (H)) Male: 10 mL/min (A) Male: 12.1 mL/min (A) Liver Function Tests: Recent Labs  Lab 09/04/19 2223 09/05/19 1059 09/05/19 1229  AST 30 24  --   ALT 29 30  --   ALKPHOS 62 62  --   BILITOT 0.9 0.6  --   PROT 7.2 6.2*  --   ALBUMIN 3.6 3.4* 3.6   No results for input(s): LIPASE, AMYLASE in the last 168 hours. No results for input(s): AMMONIA in the last 168 hours. Coagulation Profile: No results for input(s): INR, PROTIME in the last 168 hours. Cardiac Enzymes: No results for  input(s): CKTOTAL, CKMB, CKMBINDEX, TROPONINI in the last 168 hours. BNP (last 3 results) No results for input(s): PROBNP in the last 8760 hours. HbA1C: No results for input(s): HGBA1C in the last 72 hours. CBG: Recent Labs  Lab 09/05/19 0520  GLUCAP 87   Lipid Profile: No results for input(s): CHOL, HDL, LDLCALC, TRIG, CHOLHDL, LDLDIRECT in the last 72 hours. Thyroid Function Tests: No results for input(s): TSH, T4TOTAL, FREET4, T3FREE, THYROIDAB in the last 72 hours. Anemia Panel: No results for input(s): VITAMINB12, FOLATE, FERRITIN, TIBC, IRON, RETICCTPCT in the last 72 hours. Sepsis Labs: No results for input(s): PROCALCITON, LATICACIDVEN in the last 168 hours.  Recent Results (from the past 240 hour(s))  Respiratory Panel by RT PCR (Flu A&B, Covid) - Nasopharyngeal Swab     Status: None   Collection Time: 09/04/19 11:54 PM   Specimen: Nasopharyngeal Swab  Result Value Ref Range Status   SARS Coronavirus 2 by RT PCR NEGATIVE NEGATIVE Final    Comment: (NOTE) SARS-CoV-2 target nucleic acids are NOT DETECTED. The SARS-CoV-2 RNA is generally detectable in upper respiratoy specimens  during the acute phase of infection. The lowest concentration of SARS-CoV-2 viral copies this assay can detect is 131 copies/mL. A negative result does not preclude SARS-Cov-2 infection and should not be used as the sole basis for treatment or other patient management decisions. A negative result may occur with  improper specimen collection/handling, submission of specimen other than nasopharyngeal swab, presence of viral mutation(s) within the areas targeted by this assay, and inadequate number of viral copies (<131 copies/mL). A negative result must be combined with clinical observations, patient history, and epidemiological information. The expected result is Negative. Fact Sheet for Patients:  PinkCheek.be Fact Sheet for Healthcare Providers:    GravelBags.it This test is not yet ap proved or cleared by the [place] FDA and  has been authorized for detection and/or diagnosis of SARS-CoV-2 by FDA under an Emergency Use Authorization (EUA). This EUA will remain  in effect (meaning this test can be used) for the duration of the COVID-19 declaration under Section 564(b)(1) of the Act, 21 U.S.C. section 360bbb-3(b)(1), unless the authorization is terminated or revoked sooner.    Influenza A by PCR NEGATIVE NEGATIVE Final   Influenza B by PCR NEGATIVE NEGATIVE Final    Comment: (NOTE) The Xpert Xpress SARS-CoV-2/FLU/RSV assay is intended as an aid in  the diagnosis of influenza from Nasopharyngeal swab specimens and  should not be used as a sole basis for treatment. Nasal washings and  aspirates are unacceptable for Xpert Xpress SARS-CoV-2/FLU/RSV  testing. Fact Sheet for Patients: PinkCheek.be Fact Sheet for Healthcare Providers: GravelBags.it This test is not yet approved or cleared by the [place] FDA and  has been authorized for detection and/or diagnosis of SARS-CoV-2 by  FDA under an Emergency Use Authorization (EUA). This EUA will remain  in effect (meaning this test can be used) for the duration of the  Covid-19 declaration under Section 564(b)(1) of the Act, 21  U.S.C. section 360bbb-3(b)(1), unless the authorization is  terminated or revoked. Performed at [facility], [place] [address] [place], [place] [address]          Radiology Studies: DG Chest Port 1 View  Result Date: 09/04/2019 CLINICAL DATA:  Shortness of breath EXAM: PORTABLE CHEST 1 VIEW COMPARISON:  02/20/2019 FINDINGS: There is scattered bilateral airspace opacities, greatest in the peripheral right lung zone. There is chronic stable pleural thickening along the right chest wall. There is chronic blunting of the right costophrenic angle with a new  small to moderate size right-sided pleural effusion. There is a retrocardiac opacity. There is cardiomegaly with vascular congestion. There is no pneumothorax. IMPRESSION: 1. Cardiomegaly with vascular congestion. 2. Small to moderate size right-sided pleural effusion. 3. Scattered bilateral airspace opacities which may represent multifocal pneumonia or developing pulmonary edema. 4. Chronic pleural thickening along the right chest wall. Electronically Signed   By: Constance Holster M.D.   On: 09/04/2019 22:47        Scheduled Meds: . amitriptyline  25 mg Oral QHS  . azelastine  1 spray Each Nare Daily   And  . fluticasone  1 spray Each Nare Daily  . carvedilol  25 mg Oral BID WC  . Chlorhexidine Gluconate Cloth  6 each Topical Q0600  . dolutegravir  50 mg Oral Q breakfast   And  . rilpivirine  25 mg Oral Q breakfast  . doxazosin  8 mg Oral QHS  . ferric citrate  420 mg Oral TID WC   And  . ferric citrate  210  mg Oral With snacks  . lisinopril  10 mg Oral QHS  . mometasone-formoterol  2 puff Inhalation BID  . montelukast  10 mg Oral QHS  . polyethylene glycol  17 g Oral Daily  . pravastatin  40 mg Oral QHS  . senna  1 tablet Oral Daily  . zidovudine  300 mg Oral Daily   Continuous Infusions: . diltiazem (CARDIZEM) infusion 10 mg/hr (09/06/19 0358)  . heparin 1,550 Units/hr (09/06/19 0442)  . nitroGLYCERIN Stopped (09/05/19 1814)     LOS: 1 day   Time spent= 35 mins    Persais Ethridge Arsenio Loader, MD [facility]  If 7PM-7AM, please contact night-coverage  09/06/2019, 7:48 AM

## 2019-09-06 NOTE — Evaluation (Signed)
Physical Therapy Evaluation Patient Details Name: Darrell Johnson MRN: [id number] DOB: 1966-01-08 Today's Date: 09/06/2019   History of Present Illness   Darrell Johnson is a 54 y.o. adult with medical history significant of end-stage renal disease on hemodialysis on Mondays Wednesdays and Fridays, diastolic dysfunction CHF, tobacco abuse, HIV infection, seizure disorder, chronic LBP, hyperlipidemia and asthma who did not miss hemodialysis apparently but came in with significant fluid overload, hypertensive urgency hypoxia requiring BiPAP as well as generally unwell.    Clinical Impression  Patient evaluated by Physical Therapy with no further acute PT needs identified. All education has been completed and the patient has no further questions. Pt on RA upon PT entry, SpO2 90%. Pt ambulated 200', SPO2 dropped to 86% in hallway with mask on, of note, pt reporting that he felt he could not breathe well with shape of mask. Within room without mask pt ambulated 40' and sats remained 90% on RA. HR 86 bpm with ambulation.  See below for any follow-up Physical Therapy or equipment needs. PT is signing off. Thank you for this referral.     Follow Up Recommendations No PT follow up    Equipment Recommendations  None recommended by PT    Recommendations for Other Services       Precautions / Restrictions Precautions Precautions: Other (comment) Precaution Comments: watch HR and sats Restrictions Weight Bearing Restrictions: No      Mobility  Bed Mobility Overal bed mobility: Independent                Transfers Overall transfer level: Independent Equipment used: None                Ambulation/Gait Ambulation/Gait assistance: Modified independent (Device/Increase time) Gait Distance (Feet): 200 Feet Assistive device: None Gait Pattern/deviations: WFL(Within Functional Limits) Gait velocity: WFL Gait velocity interpretation: >2.62 ft/sec, indicative of community  ambulatory General Gait Details: with mask on in hallway pt reports he does not feel that he can breathe well, SPO2 86%. Within room pt allowed to have mask off and ambulated 30', SPO2 remained 90%. HR 86 bpm with ambulation  Stairs            Wheelchair Mobility    Modified Rankin (Stroke Patients Only)       Balance Overall balance assessment: Independent                                           Pertinent Vitals/Pain Pain Assessment: No/denies pain    Home Living Family/patient expects to be discharged to:: Private residence Living Arrangements: Spouse/significant other Available Help at Discharge: Family;Available PRN/intermittently Type of Home: House Home Access: Level entry     Home Layout: One level Home Equipment: None Additional Comments: pt's wife works, family lives nearby    Prior Function Level of Independence: Independent         Comments: drives self to HD     Hand Dominance        Extremity/Trunk Assessment   Upper Extremity Assessment Upper Extremity Assessment: Overall WFL for tasks assessed    Lower Extremity Assessment Lower Extremity Assessment: Overall WFL for tasks assessed    Cervical / Trunk Assessment Cervical / Trunk Assessment: Normal  Communication   Communication: No difficulties(difficult to understand at times)  Cognition Arousal/Alertness: Awake/alert Behavior During Therapy: WFL for tasks assessed/performed Overall Cognitive Status: Within Functional Limits  for tasks assessed                                        General Comments General comments (skin integrity, edema, etc.): pt instructed to ambulate with nursing in hallway    Exercises     Assessment/Plan    PT Assessment Patent does not need any further PT services  PT Problem List         PT Treatment Interventions      PT Goals (Current goals can be found in the Care Plan section)  Acute Rehab PT  Goals Patient Stated Goal: return home PT Goal Formulation: All assessment and education complete, DC therapy    Frequency     Barriers to discharge        Co-evaluation               AM-PAC PT "6 Clicks" Mobility  Outcome Measure Help needed turning from your back to your side while in a flat bed without using bedrails?: None Help needed moving from lying on your back to sitting on the side of a flat bed without using bedrails?: None Help needed moving to and from a bed to a chair (including a wheelchair)?: None Help needed standing up from a chair using your arms (e.g., wheelchair or bedside chair)?: None Help needed to walk in hospital room?: None Help needed climbing 3-5 steps with a railing? : A Little 6 Click Score: 23    End of Session   Activity Tolerance: Patient tolerated treatment well Patient left: in bed;with call bell/phone within reach Nurse Communication: Mobility status PT Visit Diagnosis: Unsteadiness on feet (R26.81)    Time: 8118-8677 PT Time Calculation (min) (ACUTE ONLY): 22 min   Charges:   PT Evaluation $PT Eval Moderate Complexity: [place]  Pager [phone] Office [place] 09/06/2019, 10:57 AM

## 2019-09-06 NOTE — Progress Notes (Signed)
ANTICOAGULATION CONSULT NOTE - Follow Up Consult  Pharmacy Consult for Heparin Indication: atrial fibrillation  No Known Allergies  Patient Measurements: Height: 5\' 9"  (175.3 cm) Weight: 245 lb 9.5 oz (111.4 kg) IBW/kg (Calculated) : 70.7 Heparin Dosing Weight: 96.1 kg  Vital Signs: Temp: 98.8 F (37.1 C) (02/16 0005) Temp Source: Oral (02/16 0005) BP: 152/80 (02/16 0345) Pulse Rate: 165 (02/16 0345)  Labs: Recent Labs    09/04/19 2223 09/04/19 2223 09/05/19 0150 09/05/19 0150 09/05/19 1059 09/05/19 1229 09/06/19 0349  HGB 10.2*   < > 9.4*   < > 9.6*  --  9.8*  HCT 32.1*   < > 29.5*  --  29.7*  --  30.0*  PLT 248   < > 208  --  200  --  208  HEPARINUNFRC  --   --   --   --   --   --  0.18*  CREATININE 15.12*   < > 15.56*  --  11.25* 8.57*  --   TROPONINIHS 61*  --  172*  --   --   --   --    < > = values in this interval not displayed.    Estimated Creatinine Clearance (by C-G formula based on SCr of 8.57 mg/dL (H)) Male: 10 mL/min (A) Male: 12.1 mL/min (A)   Assessment: 54 yo male found to be in Afib with RVR. Pharmacy consulted to initiate heparin drip.  2/16 AM update: Heparin level is sub-therapeutic No issues per RN Hgb stable  Goal of Therapy:  Heparin level 0.3-0.7 units/ml Monitor platelets by anticoagulation protocol: Yes   Plan:  Heparin 2000 units re-bol[employer] heparin to 1550 units/hr Re-check heparin level at [place], PharmD, [place] Pharmacist Phone: [phone]

## 2019-09-07 LAB — COMPREHENSIVE METABOLIC PANEL
ALT: 25 U/L (ref 0–44)
AST: 14 U/L — ABNORMAL LOW (ref 15–41)
Albumin: 3.1 g/dL — ABNORMAL LOW (ref 3.5–5.0)
Alkaline Phosphatase: 57 U/L (ref 38–126)
Anion gap: 17 — ABNORMAL HIGH (ref 5–15)
BUN: 72 mg/dL — ABNORMAL HIGH (ref 6–20)
CO2: 24 mmol/L (ref 22–32)
Calcium: 8.9 mg/dL (ref 8.9–10.3)
Chloride: 94 mmol/L — ABNORMAL LOW (ref 98–111)
Creatinine, Ser: 11.58 mg/dL — ABNORMAL HIGH (ref 0.61–1.24)
GFR calc Af Amer: 5 mL/min — ABNORMAL LOW (ref >60)
GFR calc non Af Amer: 4 mL/min — ABNORMAL LOW (ref >60)
Glucose, Bld: 91 mg/dL (ref 70–99)
Potassium: 4.6 mmol/L (ref 3.5–5.1)
Sodium: 135 mmol/L (ref 135–145)
Total Bilirubin: 0.7 mg/dL (ref 0.3–1.2)
Total Protein: 6.3 g/dL — ABNORMAL LOW (ref 6.5–8.1)

## 2019-09-07 LAB — CBC
HCT: 27.3 % — ABNORMAL LOW (ref 39.0–52.0)
Hemoglobin: 9.2 g/dL — ABNORMAL LOW (ref 13.0–17.0)
MCH: 33.5 pg (ref 26.0–34.0)
MCHC: 33.7 g/dL (ref 30.0–36.0)
MCV: 99.3 fL (ref 80.0–100.0)
Platelets: 168 10*3/uL (ref 150–400)
RBC: 2.75 MIL/uL — ABNORMAL LOW (ref 4.22–5.81)
RDW: 14.7 % (ref 11.5–15.5)
WBC: 4.8 10*3/uL (ref 4.0–10.5)
nRBC: 0 % (ref 0.0–0.2)

## 2019-09-07 LAB — MAGNESIUM: Magnesium: 2.3 mg/dL (ref 1.7–2.4)

## 2019-09-07 LAB — HEPARIN LEVEL (UNFRACTIONATED): Heparin Unfractionated: 0.33 IU/mL (ref 0.30–0.70)

## 2019-09-07 LAB — T4, FREE: Free T4: 3.48 ng/dL — ABNORMAL HIGH (ref 0.61–1.12)

## 2019-09-07 MED ORDER — METHIMAZOLE 5 MG PO TABS: 5.0000 mg | ORAL_TABLET | Freq: Two times a day (BID) | ORAL | 0 refills | Status: DC

## 2019-09-07 MED ORDER — ASPIRIN EC 325 MG PO TBEC: 325.0000 mg | DELAYED_RELEASE_TABLET | Freq: Every day | ORAL | Status: AC

## 2019-09-07 NOTE — Progress Notes (Signed)
PT provided discharge instructions and education. Pt vitals stable. Pt denies complaints. Pt telebox removed/ccmd notified. IV's removed and intact. Pt has all belongings. PT to be tx via wheelchair tot valet to meet ride.  Jerald Kief, RN

## 2019-09-07 NOTE — Procedures (Signed)
Patient seen and examined on Hemodialysis. BP (!) 153/76   Pulse 85   Temp 97.6 F (36.4 C) (Oral)   Resp 18   Ht 5\' 9"  (1.753 m)   Wt 112.9 kg   SpO2 100%   BMI 36.76 kg/m   QB 400 mL/ min via AVF UF goal 3.5L  Tolerating treatment without complaints at this time.  Madelon Lips 9:33 AM

## 2019-09-07 NOTE — Progress Notes (Signed)
ANTICOAGULATION CONSULT NOTE  Pharmacy Consult for Heparin Indication: atrial fibrillation  No Known Allergies  Patient Measurements: Height: 5\' 9"  (175.3 cm) Weight: 248 lb 14.4 oz (112.9 kg) IBW/kg (Calculated) : 70.7 Heparin Dosing Weight: 96.1 kg  Vital Signs: Temp: 97.6 F (36.4 C) (02/17 0650) Temp Source: Oral (02/17 0650) BP: 160/73 (02/17 0830) Pulse Rate: 88 (02/17 0830)  Labs: Recent Labs    09/04/19 2223 09/04/19 2223 09/05/19 0150 09/05/19 0150 09/05/19 1059 09/05/19 1059 09/05/19 1229 09/06/19 0349 09/06/19 0349 09/06/19 1255 09/06/19 2150 09/07/19 0228  HGB 10.2*   < > 9.4*   < > 9.6*   < >  --  9.8*  --   --   --  9.2*  HCT 32.1*   < > 29.5*   < > 29.7*  --   --  30.0*  --   --   --  27.3*  PLT 248   < > 208   < > 200  --   --  208  --   --   --  168  HEPARINUNFRC  --   --   --   --   --   --   --  0.18*   < > 0.14* 0.35 0.33  CREATININE 15.12*   < > 15.56*   < > 11.25*  --  8.57*  --   --   --   --  11.58*  TROPONINIHS 61*  --  172*  --   --   --   --   --   --   --   --   --    < > = values in this interval not displayed.    Estimated Creatinine Clearance (by C-G formula based on SCr of 11.58 mg/dL (H)) Male: 7.4 mL/min (A) Male: 9 mL/min (A)   Assessment: 54 yo male found to be in Afib with RVR and started on IV heparin. Patient is not on anticoagulation PTA. Heparin level now therapeutic at 0.33. CBC stable. No active bleed issues documented.  Goal of Therapy:  Heparin level 0.3-0.7 units/ml Monitor platelets by anticoagulation protocol: Yes   Plan:  Continue heparin IV at 1850 units/hr Daily heparin level Monitor daily CBC, s/sx bleeding  Thank you Anette Guarneri, PharmD 09/07/2019 8:36 AM

## 2019-09-07 NOTE — Progress Notes (Signed)
[facility] Progress Note   Assessment/ Plan:    Dialysis orders: MWF GOC 4 hrs 30 min EDW 111.5 F200 dialyzer BFR 475  2K 2 Ca bath UF Profile 2 Parsabiv 10 q rx Heparin 6000 bolus and 3000 mid run Mircera 100 q 2 weeks Calcitriol 3.75 q rx  1. Resp distress/ hypoxemia -resolved s/p serial HD.   2. ESRD - on HD MWF.  Has not missed HD.  Challenging EDW 2/17 3. Afib with RVR- new, s/p dilt gtt, on coreg now,  and heparin gtt now.  TSH < 0.010 4. Hyperthyroidism: new dx, per primary, TSH < 0.010, free T4 3.48, likely will need PTU vs methimazole 5. HIV infection - on meds 6. HTN/ vol -much better with volume removal and PO meds, still has some BP issues which hopefully good UF goal will fix 7. Asthma/ COPD - on meds 8. Anemia ckd - Watch and ESA as needed 9. Dispo: pending  Subjective:    On HD today, feeling OK.  Still a little hypertensive, going for healthy UF goal.   Objective:   BP (!) 153/76   Pulse 85   Temp 97.6 F (36.4 C) (Oral)   Resp 18   Ht 5\' 9"  (1.753 m)   Wt 112.9 kg   SpO2 100%   BMI 36.76 kg/m   Physical Exam: Gen: lying in bed, NAD  CVS:  RRR no S3 present Resp: clear bilaterally no c/w/r Abd: soft, nontender NABS Ext: no real LE edema  Labs: BMET Recent Labs  Lab 09/04/19 2223 09/05/19 0150 09/05/19 1059 09/05/19 1229 09/07/19 0228  NA 137  --  142 138 135  K 4.6  --  4.3 4.0 4.6  CL 93*  --  93* 94* 94*  CO2 21*  --  26 27 24   GLUCOSE 158*  --  97 92 91  BUN 93*  --  65* 49* 72*  CREATININE 15.12* 15.56* 11.25* 8.57* 11.58*  CALCIUM 8.8*  --  9.2 9.2 8.9  PHOS  --   --  8.0* 6.4*  --    CBC Recent Labs  Lab 09/04/19 2223 09/04/19 2223 09/05/19 0150 09/05/19 1059 09/06/19 0349 09/07/19 0228  WBC 11.0*   < > 9.8 6.8 6.6 4.8  NEUTROABS 8.8*  --   --   --   --   --   HGB 10.2*   < > 9.4* 9.6* 9.8* 9.2*  HCT 32.1*   < > 29.5* 29.7* 30.0* 27.3*  MCV 103.9*   < > 103.1* 105.3* 102.0* 99.3  PLT 248   < > 208  200 208 168   < > = values in this interval not displayed.      Medications:    . amitriptyline  25 mg Oral QHS  . azelastine  1 spray Each Nare Daily   And  . fluticasone  1 spray Each Nare Daily  . carvedilol  25 mg Oral BID WC  . Chlorhexidine Gluconate Cloth  6 each Topical Q0600  . dolutegravir  50 mg Oral Q breakfast   And  . rilpivirine  25 mg Oral Q breakfast  . doxazosin  8 mg Oral QHS  . ferric citrate  420 mg Oral TID WC   And  . ferric citrate  210 mg Oral With snacks  . lisinopril  10 mg Oral QHS  . mometasone-formoterol  2 puff Inhalation BID  . montelukast  10 mg Oral QHS  . polyethylene glycol  17 g Oral Daily  . pravastatin  40 mg Oral QHS  . senna  1 tablet Oral Daily  . zidovudine  300 mg Oral Daily     Madelon Lips, MD 09/07/2019, 9:29 AM

## 2019-09-07 NOTE — Progress Notes (Signed)
Per [facility], RN is to removed packing from wound and have pt shower, cleaning the area. Wound is not to be packed. Jerald Kief, RN

## 2019-09-07 NOTE — Progress Notes (Signed)
   09/07/19 1310  Mobility  Activity Ambulated in room  Range of Motion Active;All extremities  Level of Assistance Independent  Minutes Sat in Chair 120 minutes  Mobility Response Tolerated well

## 2019-09-07 NOTE — Progress Notes (Signed)
Pt received from dialysis. Pt denies complaints. Vitals stable. Pt had 3L removed in 3.5 hrs. Call bell within reach. Will continue to monitor.  Jerald Kief, RN

## 2019-09-07 NOTE — Progress Notes (Signed)
OT Cancellation Note  Patient Details Name: Darrell Johnson MRN: [id number] DOB: 03-31-1966   Cancelled Treatment:    Reason Eval/Treat Not Completed: Patient at procedure or test/ unavailable. Pt in HD will follow and see as able.   Jolaine Artist, OT Acute Rehabilitation Services Pager [phone] Office [phone]   Delight Stare 09/07/2019, 9:53 AM

## 2019-09-08 ENCOUNTER — Telehealth: Payer: Self-pay

## 2019-09-08 ENCOUNTER — Telehealth: Payer: Self-pay | Admitting: Physician Assistant

## 2019-09-08 ENCOUNTER — Encounter: Payer: Medicare Other | Admitting: Internal Medicine

## 2019-09-08 NOTE — Telephone Encounter (Signed)
Transition Care Management Follow-up Telephone Call Date of discharge and from where: 09/07/2019, [facility].  Call placed to the patient. He was driving and was going to get something to eat.  CM to call back at a later time.

## 2019-09-08 NOTE — Telephone Encounter (Signed)
Transition of Care Contact from inpatient facility  Date of discharge: 09/07/19 Date of contact: 09/08/19 Method of contact: phone  Attempt to contact patient to discuss transition of care from inpatient facility. Patient did not answer the phone.Unable to leave a voicemail. Will continue to follow up by phone or at outpatient HD center.  Anice Paganini, PA-C 09/08/2019, 1:23 PM  [facility] Pager: [phone]

## 2019-09-08 NOTE — Telephone Encounter (Signed)
Call placed to patient again.   He stated that he is doing okay post discharge. He then noted that he has a PCP  - Dr Tamala Julian at [facility].    Ms Oletta Lamas removed as PCP in [place].  Instructed patient to follow up with his PCP regarding any medication questions/concerns. and need for CPAP and he said he would call that office tomorrow.

## 2019-09-09 NOTE — Discharge Summary (Signed)
Physician Discharge Summary  Darrell Johnson ZJQ:[id number] DOB: 1965/10/09 DOA: 09/04/2019  PCP: No primary care provider on file.  Admit date: 09/04/2019 Discharge date: 09/09/2019  Time spent: 35 minutes  1. PCP in 1 week 2. Outpatient Cardiology in 1 month   Discharge Diagnoses:  Principal Problem:   Acute respiratory failure with hypoxia (HCC) Volume overload Transient atrial fibrillation   HIV disease (HCC)   Essential hypertension   Chronic pain disorder   Tobacco abuse   ESRD on dialysis ([place])   Hyperlipidemia   GERD (gastroesophageal reflux disease)   Acute on chronic diastolic (congestive) heart failure (HCC)   Acute on chronic respiratory failure with hypoxemia ([place])   Discharge Condition: Stable Diet recommendation: Renal diet  Filed Weights   09/07/19 0456 09/07/19 0650 09/07/19 1029  Weight: 110.7 kg 112.9 kg 111.3 kg    History of present illness:  54 year old with history of HTN, HLD, ESRD Monday Wednesday Friday, chronic D CHF, HIV, seizure disorder, tobacco disorder, asthma, obstructive sleep apnea, COPD still awaiting new CPAP machine came to the ED on 2/14 with progressive dyspnea, hypoxia and hypertensive urgency.  Initially placed on BiPAP was taken for hemodialysis urgently   Hospital Course:   Acute hypoxic respiratory failure secondary to fluid overload Acute congestive heart failure with preserved ejection fraction, ejection fraction 65% ESRD hemodialysis MWF -Required BiPAP on admission, improved quickly after extra dialysis with volume removal, had not missed any outpatient dialysis, suspect this needed his dry weight lowered  -Was able to be weaned off oxygen  -Improved and discharged home in a stable condition  Transient atrial fibrillation with RVR; CHADsVASC- Min of 2.  -He did have transient atrial fibrillation following dialysis day 2 of hospitalization which resolved after few hours -briefly required Cardizem drip which was  subsequently discontinued and then restarted on home regimen of carvedilol  -Converted back to sinus rhythm and has remained in sinus rhythm for the past 48 hours, discussed anticoagulation options with patient, but he is would like to defer this given this was a transient episode and he would like to discuss risk benefits of anticoagulation again with his PCP prior to considering this  -recommend cardiology FU and FU with PCP in 1 week  Essential hypertension, uncontrolled -Hypertensive urgency, improved -Resolved with dialysis -Lisinopril 10 mg at bedtime.  Coreg 25 mg twice daily.  GERD -PPI  Obstructive sleep apnea History of COPD/asthma -Currently awaiting for new CPAP machine at home. -As needed bronchodilators  Anemia of chronic disease -Hemoglobin stable.  Currently 9.8.  History of HIV Chronic pain syndrome -On HAARTmedications   Discharge Exam: Vitals:   09/07/19 1029 09/07/19 1110  BP: (!) 146/68 (!) 153/67  Pulse: 86 88  Resp: 18 15  Temp: 97.9 F (36.6 C) 98.5 F (36.9 C)  SpO2: 100% 95%    General: AAOx3 Cardiovascular: S!S2/RRR Respiratory:CTAB  Discharge Instructions   Discharge Instructions    Discharge instructions   Complete by: As directed    Renal Diet   Increase activity slowly   Complete by: As directed      Allergies as of 09/07/2019   No Known Allergies     Medication List    STOP taking these medications   clindamycin 300 MG capsule Commonly known as: CLEOCIN   doxycycline 100 MG tablet Commonly known as: VIBRA-TABS   hydrALAZINE 50 MG tablet Commonly known as: APRESOLINE   predniSONE 10 MG tablet Commonly known as: DELTASONE     TAKE these medications  acetaminophen 500 MG tablet Commonly known as: TYLENOL Take 500-1,000 mg by mouth every 6 (six) hours as needed for mild pain or headache.   albuterol 108 (90 Base) MCG/ACT inhaler Commonly known as: VENTOLIN HFA Inhale 2 puffs into the lungs every 4 (four)  hours as needed for up to 30 days for wheezing or shortness of breath.   amitriptyline 25 MG tablet Commonly known as: ELAVIL TAKE 1 TABLET BY MOUTH EVERYDAY AT BEDTIME What changed: See the new instructions. Notes to patient: Take tonight @ bedtime   amLODipine 10 MG tablet Commonly known as: NORVASC Take 1 tablet (10 mg total) by mouth daily. What changed: when to take this   aspirin EC 325 MG tablet Take 1 tablet (325 mg total) by mouth daily. Notes to patient: Take today   Azelastine-Fluticasone 137-50 MCG/ACT Susp Place 1 spray into both nostrils daily.   budesonide-formoterol 160-4.5 MCG/ACT inhaler Commonly known as: SYMBICORT Inhale 2 puffs into the lungs 2 (two) times daily.   carvedilol 25 MG tablet Commonly known as: COREG Take 1 tablet (25 mg total) by mouth 2 (two) times daily with a meal. Notes to patient: Take this afternoon   cetirizine 10 MG tablet Commonly known as: ZYRTEC Take 10 mg by mouth daily as needed for allergies.   doxazosin 8 MG tablet Commonly known as: CARDURA Take 1 tablet (8 mg total) by mouth at bedtime. Notes to patient: Take tonight @ bedtime   ferric citrate 1 GM 210 MG(Fe) tablet Commonly known as: AURYXIA Take 210-420 mg by mouth See admin instructions. Take 420 mg by mouth three times a day with meals and 210 mg with each snack Notes to patient: Take this afternoon   ipratropium-albuterol 0.5-2.5 (3) MG/3ML Soln Commonly known as: DUONEB Take 3 mLs by nebulization every 6 (six) hours as needed. What changed: reasons to take this   Juluca 50-25 MG Tabs Generic drug: Dolutegravir-Rilpivirine TAKE 1 TABLET BY MOUTH DAILY WITH SUPPER. What changed: when to take this   lisinopril 10 MG tablet Commonly known as: ZESTRIL Take 10 mg by mouth at bedtime. Notes to patient: Take tonight @ bedtime   methimazole 5 MG tablet Commonly known as: Tapazole Take 1 tablet (5 mg total) by mouth 2 (two) times daily. Notes to patient: Take  this afternoon   montelukast 10 MG tablet Commonly known as: SINGULAIR Take 10 mg by mouth at bedtime. Notes to patient: Take tonight @ bedtime   pravastatin 40 MG tablet Commonly known as: PRAVACHOL TAKE 1 TABLET BY MOUTH EVERY DAY What changed: when to take this Notes to patient: Take this evening   sildenafil 100 MG tablet Commonly known as: VIAGRA Take 100 mg by mouth daily as needed for erectile dysfunction.   zidovudine 300 MG tablet Commonly known as: RETROVIR TAKE 1 TABLET (300 MG TOTAL) BY MOUTH DAILY. Notes to patient: Take tomorrow      No Known Allergies Follow-up Information    Kerin Perna, NP. Schedule an appointment as soon as possible for a visit in 1 week(s).   Specialty: Internal Medicine Why: Please Follow up in 1 week and you need a follow up with endocrinology for Hyperthyroidism(Overactive thyroid gland) Contact information: [place] [address] [phone]            The results of significant diagnostics from this hospitalization (including imaging, microbiology, ancillary and laboratory) are listed below for reference.    Significant Diagnostic Studies: DG Chest [facility] 1 [address]  Result Date: 09/04/2019 CLINICAL DATA:  Shortness of breath EXAM: PORTABLE CHEST 1 VIEW COMPARISON:  02/20/2019 FINDINGS: There is scattered bilateral airspace opacities, greatest in the peripheral right lung zone. There is chronic stable pleural thickening along the right chest wall. There is chronic blunting of the right costophrenic angle with a new small to moderate size right-sided pleural effusion. There is a retrocardiac opacity. There is cardiomegaly with vascular congestion. There is no pneumothorax. IMPRESSION: 1. Cardiomegaly with vascular congestion. 2. Small to moderate size right-sided pleural effusion. 3. Scattered bilateral airspace opacities which may represent multifocal pneumonia or developing pulmonary edema. 4. Chronic pleural  thickening along the right chest wall. Electronically Signed   By: Constance Holster M.D.   On: 09/04/2019 22:47    Microbiology: Recent Results (from the past 240 hour(s))  Respiratory Panel by RT PCR (Flu A&B, Covid) - Nasopharyngeal Swab     Status: None   Collection Time: 09/04/19 11:54 PM   Specimen: Nasopharyngeal Swab  Result Value Ref Range Status   SARS Coronavirus 2 by RT PCR NEGATIVE NEGATIVE Final    Comment: (NOTE) SARS-CoV-2 target nucleic acids are NOT DETECTED. The SARS-CoV-2 RNA is generally detectable in upper respiratoy specimens during the acute phase of infection. The lowest concentration of SARS-CoV-2 viral copies this assay can detect is 131 copies/mL. A negative result does not preclude SARS-Cov-2 infection and should not be used as the sole basis for treatment or other patient management decisions. A negative result may occur with  improper specimen collection/handling, submission of specimen other than nasopharyngeal swab, presence of viral mutation(s) within the areas targeted by this assay, and inadequate number of viral copies (<131 copies/mL). A negative result must be combined with clinical observations, patient history, and epidemiological information. The expected result is Negative. Fact Sheet for Patients:  PinkCheek.be Fact Sheet for Healthcare Providers:  GravelBags.it This test is not yet ap proved or cleared by the [place] FDA and  has been authorized for detection and/or diagnosis of SARS-CoV-2 by FDA under an Emergency Use Authorization (EUA). This EUA will remain  in effect (meaning this test can be used) for the duration of the COVID-19 declaration under Section 564(b)(1) of the Act, 21 U.S.C. section 360bbb-3(b)(1), unless the authorization is terminated or revoked sooner.    Influenza A by PCR NEGATIVE NEGATIVE Final   Influenza B by PCR NEGATIVE NEGATIVE Final     Comment: (NOTE) The Xpert Xpress SARS-CoV-2/FLU/RSV assay is intended as an aid in  the diagnosis of influenza from Nasopharyngeal swab specimens and  should not be used as a sole basis for treatment. Nasal washings and  aspirates are unacceptable for Xpert Xpress SARS-CoV-2/FLU/RSV  testing. Fact Sheet for Patients: PinkCheek.be Fact Sheet for Healthcare Providers: GravelBags.it This test is not yet approved or cleared by the [place] FDA and  has been authorized for detection and/or diagnosis of SARS-CoV-2 by  FDA under an Emergency Use Authorization (EUA). This EUA will remain  in effect (meaning this test can be used) for the duration of the  Covid-19 declaration under Section 564(b)(1) of the Act, 21  U.S.C. section 360bbb-3(b)(1), unless the authorization is  terminated or revoked. Performed at [facility], [place] [address] [place], [place] [address]      Labs: Basic Metabolic Panel: Recent Labs  Lab 09/04/19 2223 09/05/19 0150 09/05/19 1059 09/05/19 1229 09/07/19 0228  NA 137  --  142 138 135  K 4.6  --  4.3 4.0 4.6  CL 93*  --  93* 94* 94*  CO2 21*  --  26 27 24   GLUCOSE 158*  --  97 92 91  BUN 93*  --  65* 49* 72*  CREATININE 15.12* 15.56* 11.25* 8.57* 11.58*  CALCIUM 8.8*  --  9.2 9.2 8.9  MG  --   --   --   --  2.3  PHOS  --   --  8.0* 6.4*  --    Liver Function Tests: Recent Labs  Lab 09/04/19 2223 09/05/19 1059 09/05/19 1229 09/07/19 0228  AST 30 24  --  14*  ALT 29 30  --  25  ALKPHOS 62 62  --  57  BILITOT 0.9 0.6  --  0.7  PROT 7.2 6.2*  --  6.3*  ALBUMIN 3.6 3.4* 3.6 3.1*   No results for input(s): LIPASE, AMYLASE in the last 168 hours. No results for input(s): AMMONIA in the last 168 hours. CBC: Recent Labs  Lab 09/04/19 2223 09/05/19 0150 09/05/19 1059 09/06/19 0349 09/07/19 0228  WBC 11.0* 9.8 6.8 6.6 4.8  NEUTROABS 8.8*  --   --   --   --   HGB 10.2* 9.4* 9.6*  9.8* 9.2*  HCT 32.1* 29.5* 29.7* 30.0* 27.3*  MCV 103.9* 103.1* 105.3* 102.0* 99.3  PLT 248 208 200 208 168   Cardiac Enzymes: No results for input(s): CKTOTAL, CKMB, CKMBINDEX, TROPONINI in the last 168 hours. BNP: BNP (last 3 results) Recent Labs    01/13/19 1207 01/23/19 2152 02/08/19 1118  BNP 472.3* 718.6* 648.2*    ProBNP (last 3 results) No results for input(s): PROBNP in the last 8760 hours.  CBG: Recent Labs  Lab 09/05/19 0520  GLUCAP 87       Signed:  Domenic Polite MD.  [facility] 09/09/2019, 2:01 PM

## 2019-09-11 ENCOUNTER — Other Ambulatory Visit (INDEPENDENT_AMBULATORY_CARE_PROVIDER_SITE_OTHER): Payer: Self-pay | Admitting: Primary Care

## 2019-09-11 DIAGNOSIS — I10 Essential (primary) hypertension: Secondary | ICD-10-CM

## 2019-09-16 ENCOUNTER — Other Ambulatory Visit (INDEPENDENT_AMBULATORY_CARE_PROVIDER_SITE_OTHER): Payer: Self-pay | Admitting: Primary Care

## 2019-09-16 NOTE — Telephone Encounter (Signed)
Sent to PCP ?

## 2019-09-19 ENCOUNTER — Other Ambulatory Visit: Payer: Self-pay | Admitting: Internal Medicine

## 2019-09-19 DIAGNOSIS — B20 Human immunodeficiency virus [HIV] disease: Secondary | ICD-10-CM

## 2019-10-05 ENCOUNTER — Ambulatory Visit: Payer: Medicare Other | Admitting: Internal Medicine

## 2019-10-06 ENCOUNTER — Encounter (INDEPENDENT_AMBULATORY_CARE_PROVIDER_SITE_OTHER): Payer: Self-pay | Admitting: Otolaryngology

## 2019-10-06 ENCOUNTER — Other Ambulatory Visit: Payer: Self-pay

## 2019-10-06 ENCOUNTER — Ambulatory Visit (INDEPENDENT_AMBULATORY_CARE_PROVIDER_SITE_OTHER): Payer: Medicare Other | Admitting: Otolaryngology

## 2019-10-06 VITALS — Temp 97.9°F

## 2019-10-06 DIAGNOSIS — J310 Chronic rhinitis: Secondary | ICD-10-CM | POA: Diagnosis not present

## 2019-10-06 NOTE — Progress Notes (Signed)
HPI: Darrell Johnson is a 54 y.o. adult who presents for evaluation of chronic sinus issues.  Patient complains of chronic postnasal drainage as well as intermittent congestion.  He also describes a lot of mucus in his throat.  He has been on nasal sprays in the past but presently not taking any medication for his sinuses.  He had previously seen Dr. Janace Hoard last year and treated for allergic rhinitis.  He was recommended a CT scan of his sinuses but never had this performed. He has end-stage renal disease and is on dialysis.  He is also HIV positive.  Past Medical History:  Diagnosis Date  . Anemia   . Anxiety   . Arthritis    "knees" (04/13/2017)  . Asthma   . CHF (congestive heart failure) ([place])   . ESRD (end stage renal disease) on dialysis [facility])    "MWF; [facility]" (04/13/2017)  . Gout, unspecified 08/13/2009   Qualifier: Diagnosis of  By: Redmond Pulling  MD, Mateo Flow    . H1N1 influenza    March 2016  . History of blood transfusion 02/2017   "related to OR"  . HIV infection ([place]) 1980's   on ART therapy since, followed by ID clinic, complicated  by neuropathy  . Hyperlipidemia    hypertrygliceridemia determined ti be secondary to ART therpay  . HYPERTENSION 05/08/2006  . Pneumonia    "once; years ago" (04/13/2017)  . Rib fractures 01/2009  . Seizures ([place])    last seizure was in the 1990s, pt has family history of seizures; "probably related to alcohol" (04/13/2017)  . 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 Syphilis 1997   history of syphilis 1997  . Tobacco abuse    Past Surgical History:  Procedure Laterality Date  . AV FISTULA PLACEMENT Right 12/02/2013   Procedure: ARTERIOVENOUS (AV) FISTULA CREATION;  Surgeon: Rosetta Posner, MD;  Location: [place];  Service: Vascular;  Laterality: Right;  . AV FISTULA PLACEMENT Right 06/09/2019   Procedure:  Right Arm Fistula Plication.;  Surgeon: Marty Heck, MD;  Location: [facility];  Service: Vascular;  Laterality: Right;  . INCISION AND DRAINAGE ABSCESS Right 03/09/2017   Procedure: INCISION AND DRAINAGE Right Scrotal Abscess;  Surgeon: Franchot Gallo, MD;  Location: [facility];  Service: Urology;  Laterality: Right;  . THROMBECTOMY Right ~ 2016   "AV fistula clotted off"   Social History   Socioeconomic History  . Marital status: Married    Spouse name: Not on file  . Number of children: Not on file  . Years of education: Not on file  . Highest education level: Not on file  Occupational History  . Occupation: details cars  Tobacco Use  . Smoking status: Former Smoker    Packs/day: 0.10    Years: 25.00    Pack years: 2.50    Types: Cigarettes    Quit date: 12/20/2018    Years since quitting: 0.7  . Smokeless tobacco: Never Used  . Tobacco comment: "quitting"  Substance and Sexual Activity  . Alcohol use: No    Alcohol/week: 0.0 standard drinks    Comment: 04/13/2017 "quit ~ 2014"  . Drug use: No  . Sexual activity: Never    Partners: Female    Comment: given condoms  Other Topics Concern  . Not on file  Social History Narrative   **  Merged History Encounter **       Social Determinants of Health   Financial Resource Strain:   . Difficulty of Paying Living Expenses:   Food Insecurity:   . Worried About Charity fundraiser in the Last Year:   . Arboriculturist in the Last Year:   Transportation Needs:   . Film/video editor (Medical):   Marland Kitchen Lack of Transportation (Non-Medical):   Physical Activity:   . Days of Exercise per Week:   . Minutes of Exercise per Session:   Stress:   . Feeling of Stress :   Social Connections:   . Frequency of Communication with Friends and Family:   . Frequency of Social Gatherings with Friends and Family:   . Attends Religious Services:   . Active Member of Clubs or Organizations:   . Attends Archivist Meetings:   Marland Kitchen  Marital Status:    Family History  Problem Relation Age of Onset  . Cancer Mother   . Hypertension Mother   . COPD Father   . Hypertension Father   . Diabetes Sister   . Hypertension Sister   . Diabetes Brother   . Hypertension Brother   . Stroke Neg Hx    No Known Allergies Prior to Admission medications   Medication Sig Start Date End Date Taking? Authorizing Provider  acetaminophen (TYLENOL) 500 MG tablet Take 500-1,000 mg by mouth every 6 (six) hours as needed for mild pain or headache.    [provider]  albuterol (PROVENTIL HFA;VENTOLIN HFA) 108 (90 Base) MCG/ACT inhaler Inhale 2 puffs into the lungs every 4 (four) hours as needed for up to 30 days for wheezing or shortness of breath. 09/21/18 09/05/19  Kerin Perna, NP  amitriptyline (ELAVIL) 25 MG tablet TAKE 1 TABLET BY MOUTH EVERYDAY AT BEDTIME Patient taking differently: Take 25 mg by mouth at bedtime.  02/10/19   Carlyle Basques, MD  amLODipine (NORVASC) 10 MG tablet Take 1 tablet (10 mg total) by mouth daily. Patient taking differently: Take 10 mg by mouth at bedtime.  09/21/18   Kerin Perna, NP  aspirin EC 325 MG tablet Take 1 tablet (325 mg total) by mouth daily. 09/07/19 09/06/20  Domenic Polite, MD  Azelastine-Fluticasone [phone] MCG/ACT SUSP Place 1 spray into both nostrils daily.    [provider]  budesonide-formoterol (SYMBICORT) 160-4.5 MCG/ACT inhaler Inhale 2 puffs into the lungs 2 (two) times daily. 09/21/18   Kerin Perna, NP  carvedilol (COREG) 25 MG tablet Take 1 tablet (25 mg total) by mouth 2 (two) times daily with a meal. 09/21/18   Kerin Perna, NP  cetirizine (ZYRTEC) 10 MG tablet Take 10 mg by mouth daily as needed for allergies.    [provider]  doxazosin (CARDURA) 8 MG tablet Take 1 tablet (8 mg total) by mouth at bedtime. 09/21/18   Kerin Perna, NP  ferric citrate (AURYXIA) 1 GM 210 MG(Fe) tablet Take 210-420 mg by mouth See admin instructions.  Take 420 mg by mouth three times a day with meals and 210 mg with each snack    [provider]  ipratropium-albuterol (DUONEB) 0.5-2.5 (3) MG/3ML SOLN Take 3 mLs by nebulization every 6 (six) hours as needed. Patient taking differently: Take 3 mLs by nebulization every 6 (six) hours as needed (shortness of breath and wheezing).  09/21/18   Kerin Perna, NP  JULUCA 50-25 MG TABS TAKE 1 TABLET BY MOUTH DAILY WITH SUPPER.  Patient taking differently: Take 1 tablet by mouth daily.  08/22/19   Carlyle Basques, MD  lisinopril (ZESTRIL) 10 MG tablet Take 10 mg by mouth at bedtime.  09/06/18   [provider]  methimazole (TAPAZOLE) 5 MG tablet Take 1 tablet (5 mg total) by mouth 2 (two) times daily. 09/07/19   Domenic Polite, MD  montelukast (SINGULAIR) 10 MG tablet Take 10 mg by mouth at bedtime.  11/16/18   [provider]  pravastatin (PRAVACHOL) 40 MG tablet Take 1 tablet (40 mg total) by mouth at bedtime. 09/19/19   Kerin Perna, NP  sildenafil (VIAGRA) 100 MG tablet Take 100 mg by mouth daily as needed for erectile dysfunction.    [provider]  zidovudine (RETROVIR) 300 MG tablet TAKE 1 TABLET (300 MG TOTAL) BY MOUTH DAILY. 08/22/19   Carlyle Basques, MD     Positive ROS: Otherwise negative  All other systems have been reviewed and were otherwise negative with the exception of those mentioned in the HPI and as above.  Physical Exam: Constitutional: Alert, well-appearing, no acute distress Ears: External ears without lesions or tenderness. Ear canals are clear bilaterally with intact, clear TMs.  Nasal: External nose without lesions. Septum is deviated to the right with moderate rhinitis and moderate clear mucus discharge.. Nasal endoscopy was performed in the office today and on nasal endoscopy there were no polyps.  Both middle meatus regions were clear with no active mucopurulent discharge.  Posterior nasal cavity was clear with a moderate amount of  clear mucus discharge.  Septum is deviated to the right. Oral: Lips and gums without lesions. Tongue and palate mucosa without lesions. Posterior oropharynx clear.  Indirect laryngoscopy revealed a clear base of tongue, vallecula, epiglottis and vocal cords. Neck: No palpable adenopathy or masses Respiratory: Breathing comfortably  Skin: No facial/neck lesions or rash noted.  Procedures  Assessment: Chronic rhinitis with postnasal drainage  Plan: Recommended regular use of nasal steroid spray Nasacort 2 sprays each nostril at night.  As well as prescribed azelastine nasal spray 1 spray twice daily.  And also instructed him to use saline nasal irrigation throughout the day as needed postnasal drainage. If symptoms do not improve on the present regimen over the next month he will call us back to schedule a CT scan of the sinuses.  Radene Journey, MD

## 2019-10-10 ENCOUNTER — Emergency Department (HOSPITAL_COMMUNITY): Payer: Medicare Other

## 2019-10-10 ENCOUNTER — Other Ambulatory Visit: Payer: Self-pay

## 2019-10-10 ENCOUNTER — Emergency Department (HOSPITAL_COMMUNITY)
Admission: EM | Admit: 2019-10-10 | Discharge: 2019-10-10 | Disposition: A | Discharge status: Home / Self Care | Payer: Medicare Other | Attending: Emergency Medicine | Admitting: Emergency Medicine

## 2019-10-10 DIAGNOSIS — B20 Human immunodeficiency virus [HIV] disease: Secondary | ICD-10-CM | POA: Insufficient documentation

## 2019-10-10 DIAGNOSIS — Z79899 Other long term (current) drug therapy: Secondary | ICD-10-CM | POA: Diagnosis not present

## 2019-10-10 DIAGNOSIS — I5032 Chronic diastolic (congestive) heart failure: Secondary | ICD-10-CM | POA: Insufficient documentation

## 2019-10-10 DIAGNOSIS — Z7982 Long term (current) use of aspirin: Secondary | ICD-10-CM | POA: Insufficient documentation

## 2019-10-10 DIAGNOSIS — Z20822 Contact with and (suspected) exposure to covid-19: Secondary | ICD-10-CM | POA: Insufficient documentation

## 2019-10-10 DIAGNOSIS — Z992 Dependence on renal dialysis: Secondary | ICD-10-CM | POA: Diagnosis not present

## 2019-10-10 DIAGNOSIS — N186 End stage renal disease: Secondary | ICD-10-CM | POA: Insufficient documentation

## 2019-10-10 DIAGNOSIS — J9601 Acute respiratory failure with hypoxia: Secondary | ICD-10-CM | POA: Diagnosis not present

## 2019-10-10 DIAGNOSIS — I132 Hypertensive heart and chronic kidney disease with heart failure and with stage 5 chronic kidney disease, or end stage renal disease: Secondary | ICD-10-CM | POA: Insufficient documentation

## 2019-10-10 DIAGNOSIS — R0603 Acute respiratory distress: Secondary | ICD-10-CM | POA: Diagnosis present

## 2019-10-10 DIAGNOSIS — J810 Acute pulmonary edema: Secondary | ICD-10-CM

## 2019-10-10 LAB — CBC WITH DIFFERENTIAL/PLATELET
Abs Immature Granulocytes: 0.02 10*3/uL (ref 0.00–0.07)
Basophils Absolute: 0 10*3/uL (ref 0.0–0.1)
Basophils Relative: 1 %
Eosinophils Absolute: 0.2 10*3/uL (ref 0.0–0.5)
Eosinophils Relative: 3 %
HCT: 33.1 % — ABNORMAL LOW (ref 39.0–52.0)
Hemoglobin: 10.5 g/dL — ABNORMAL LOW (ref 13.0–17.0)
Immature Granulocytes: 0 %
Lymphocytes Relative: 19 %
Lymphs Abs: 1.5 10*3/uL (ref 0.7–4.0)
MCH: 32.9 pg (ref 26.0–34.0)
MCHC: 31.7 g/dL (ref 30.0–36.0)
MCV: 103.8 fL — ABNORMAL HIGH (ref 80.0–100.0)
Monocytes Absolute: 0.6 10*3/uL (ref 0.1–1.0)
Monocytes Relative: 7 %
Neutro Abs: 5.4 10*3/uL (ref 1.7–7.7)
Neutrophils Relative %: 70 %
Platelets: 143 10*3/uL — ABNORMAL LOW (ref 150–400)
RBC: 3.19 MIL/uL — ABNORMAL LOW (ref 4.22–5.81)
RDW: 15.4 % (ref 11.5–15.5)
WBC: 7.7 10*3/uL (ref 4.0–10.5)
nRBC: 0 % (ref 0.0–0.2)

## 2019-10-10 LAB — I-STAT CHEM 8, ED
BUN: 70 mg/dL — ABNORMAL HIGH (ref 6–20)
Calcium, Ion: 1.16 mmol/L (ref 1.15–1.40)
Chloride: 102 mmol/L (ref 98–111)
Creatinine, Ser: 16.1 mg/dL — ABNORMAL HIGH (ref 0.61–1.24)
Glucose, Bld: 99 mg/dL (ref 70–99)
HCT: 31 % — ABNORMAL LOW (ref 39.0–52.0)
Hemoglobin: 10.5 g/dL — ABNORMAL LOW (ref 13.0–17.0)
Potassium: 4.7 mmol/L (ref 3.5–5.1)
Sodium: 136 mmol/L (ref 135–145)
TCO2: 26 mmol/L (ref 22–32)

## 2019-10-10 LAB — BASIC METABOLIC PANEL
Anion gap: 20 — ABNORMAL HIGH (ref 5–15)
BUN: 71 mg/dL — ABNORMAL HIGH (ref 6–20)
CO2: 21 mmol/L — ABNORMAL LOW (ref 22–32)
Calcium: 9.4 mg/dL (ref 8.9–10.3)
Chloride: 98 mmol/L (ref 98–111)
Creatinine, Ser: 14.03 mg/dL — ABNORMAL HIGH (ref 0.61–1.24)
GFR calc Af Amer: 4 mL/min — ABNORMAL LOW (ref >60)
GFR calc non Af Amer: 3 mL/min — ABNORMAL LOW (ref >60)
Glucose, Bld: 102 mg/dL — ABNORMAL HIGH (ref 70–99)
Potassium: 4.7 mmol/L (ref 3.5–5.1)
Sodium: 139 mmol/L (ref 135–145)

## 2019-10-10 LAB — RESPIRATORY PANEL BY RT PCR (FLU A&B, COVID)
Influenza A by PCR: NEGATIVE
Influenza B by PCR: NEGATIVE
SARS Coronavirus 2 by RT PCR: NEGATIVE

## 2019-10-10 LAB — TROPONIN I (HIGH SENSITIVITY)
Troponin I (High Sensitivity): 25 ng/L — ABNORMAL HIGH (ref <18)
Troponin I (High Sensitivity): 40 ng/L — ABNORMAL HIGH (ref <18)

## 2019-10-10 MED ORDER — HEPARIN SODIUM (PORCINE) 1000 UNIT/ML IJ SOLN
INTRAMUSCULAR | Status: AC
  Filled 2019-10-10: qty 3

## 2019-10-10 MED ORDER — NITROGLYCERIN 2 % TD OINT
1.0000 [in_us] | TOPICAL_OINTMENT | Freq: Four times a day (QID) | TRANSDERMAL | Status: DC
  Administered 2019-10-10: 1 [in_us] via TOPICAL
  Filled 2019-10-10: qty 1

## 2019-10-10 MED ORDER — HEPARIN SODIUM (PORCINE) 1000 UNIT/ML DIALYSIS
6000.0000 [IU] | Freq: Once | INTRAMUSCULAR | Status: AC
  Administered 2019-10-10: 10:00:00 3000 [IU] via INTRAVENOUS_CENTRAL

## 2019-10-10 MED ORDER — CHLORHEXIDINE GLUCONATE CLOTH 2 % EX PADS: 6.0000 | MEDICATED_PAD | Freq: Every day | CUTANEOUS | Status: DC

## 2019-10-10 NOTE — ED Provider Notes (Addendum)
[facility] EMERGENCY DEPARTMENT Provider Note   CSN: [id number] Arrival date & time: 10/10/19  0137     History Chief Complaint  Patient presents with  . Respiratory Distress    Darrell Johnson is a 54 y.o. adult.  Patient brought to the emergency department from home for evaluation of shortness of breath.  Patient has a history of congestive heart failure and end-stage renal disease, on hemodialysis Monday, Wednesday, Friday.  He has not missed any dialysis sessions.  He reports that he felt well going to bed tonight but then woke up feeling severely short of breath.  First responders reported blood pressure of 270/120.  EMS report that the patient had diffuse rales and was in severe distress.  He was sitting on the side of the bed, tripoding.  They initiated him on CPAP and administered 4 sublingual nitroglycerin for his markedly elevated blood pressure.        Past Medical History:  Diagnosis Date  . Anemia   . Anxiety   . Arthritis    "knees" (04/13/2017)  . Asthma   . CHF (congestive heart failure) ([place])   . ESRD (end stage renal disease) on dialysis [facility])    "MWF; [facility]" (04/13/2017)  . Gout, unspecified 08/13/2009   Qualifier: Diagnosis of  By: Redmond Pulling  MD, Mateo Flow    . H1N1 influenza    March 2016  . History of blood transfusion 02/2017   "related to OR"  . HIV infection ([place]) 1980's   on ART therapy since, followed by ID clinic, complicated  by neuropathy  . Hyperlipidemia    hypertrygliceridemia determined ti be secondary to ART therpay  . HYPERTENSION 05/08/2006  . Pneumonia    "once; years ago" (04/13/2017)  . Rib fractures 01/2009  . Seizures ([place])    last seizure was in the 1990s, pt has family history of seizures; "probably related to alcohol" (04/13/2017)  . 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 Syphilis 1997   history of syphilis 1997  . Tobacco abuse     Patient Active Problem List   Diagnosis Date Noted  . Acute on chronic respiratory failure with hypoxemia ([place]) 09/05/2019  . GERD (gastroesophageal reflux disease) 02/14/2019  . Depression 02/14/2019  . Elevated troponin 02/14/2019  . Acute on chronic diastolic (congestive) heart failure ([place]) 02/14/2019  . Pulmonary edema 01/31/2019  . Moderate mitral regurgitation   . Acute respiratory failure with hypoxia ([place]) 03/29/2017  . Trigger finger of right hand 09/11/2016  . Hyperlipidemia 03/31/2016  . Anxiety state 03/11/2016  . Lumbar radiculopathy 01/28/2016  . Seasonal allergies   . Healthcare maintenance 05/03/2015  . Anemia in chronic kidney disease 08/30/2014  . ESRD on dialysis ([place]) 12/27/2013  . History of syphilis 11/07/2013  . Arthritis 09/09/2013  . Tobacco abuse 09/07/2013  . Chronic pain disorder 04/28/2013  . Gout 08/13/2009  . HIV disease ([place]) 05/08/2006  . Essential hypertension 05/08/2006  . SEIZURE DISORDER 05/08/2006    Past Surgical History:  Procedure Laterality Date  . AV FISTULA PLACEMENT Right 12/02/2013   Procedure: ARTERIOVENOUS (AV) FISTULA CREATION;  Surgeon: Rosetta Posner, MD;  Location: [place];  Service: Vascular;  Laterality: Right;  . AV FISTULA PLACEMENT Right 06/09/2019   Procedure: Right Arm Fistula Plication.;  Surgeon: Marty Heck, MD;  Location: [place];  Service: Vascular;  Laterality: Right;  . INCISION AND DRAINAGE ABSCESS Right 03/09/2017   Procedure: INCISION AND DRAINAGE Right Scrotal Abscess;  Surgeon: Franchot Gallo, MD;  Location: [place];  Service: Urology;  Laterality: Right;  . THROMBECTOMY Right ~ 2016   "AV fistula clotted off"       Family History  Problem Relation Age of Onset  . Cancer Mother   . Hypertension Mother   . COPD Father   . Hypertension Father   . Diabetes Sister   . Hypertension Sister   . Diabetes Brother   .  Hypertension Brother   . Stroke Neg Hx     Social History   Tobacco Use  . Smoking status: Former Smoker    Packs/day: 0.10    Years: 25.00    Pack years: 2.50    Types: Cigarettes    Quit date: 12/20/2018    Years since quitting: 0.8  . Smokeless tobacco: Never Used  . Tobacco comment: "quitting"  Substance Use Topics  . Alcohol use: No    Alcohol/week: 0.0 standard drinks    Comment: 04/13/2017 "quit ~ 2014"  . Drug use: No    Home Medications Prior to Admission medications   Medication Sig Start Date End Date Taking? Authorizing Provider  acetaminophen (TYLENOL) 500 MG tablet Take 500-1,000 mg by mouth every 6 (six) hours as needed for mild pain or headache.    [provider]  albuterol (PROVENTIL HFA;VENTOLIN HFA) 108 (90 Base) MCG/ACT inhaler Inhale 2 puffs into the lungs every 4 (four) hours as needed for up to 30 days for wheezing or shortness of breath. 09/21/18 09/05/19  Kerin Perna, NP  amitriptyline (ELAVIL) 25 MG tablet TAKE 1 TABLET BY MOUTH EVERYDAY AT BEDTIME Patient taking differently: Take 25 mg by mouth at bedtime.  02/10/19   Carlyle Basques, MD  amLODipine (NORVASC) 10 MG tablet Take 1 tablet (10 mg total) by mouth daily. Patient taking differently: Take 10 mg by mouth at bedtime.  09/21/18   Kerin Perna, NP  aspirin EC 325 MG tablet Take 1 tablet (325 mg total) by mouth daily. 09/07/19 09/06/20  Domenic Polite, MD  Azelastine-Fluticasone [phone] MCG/ACT SUSP Place 1 spray into both nostrils daily.    [provider]  budesonide-formoterol (SYMBICORT) 160-4.5 MCG/ACT inhaler Inhale 2 puffs into the lungs 2 (two) times daily. 09/21/18   Kerin Perna, NP  carvedilol (COREG) 25 MG tablet Take 1 tablet (25 mg total) by mouth 2 (two) times daily with a meal. 09/21/18   Kerin Perna, NP  cetirizine (ZYRTEC) 10 MG tablet Take 10 mg by mouth daily as needed for allergies.    [provider]  doxazosin (CARDURA) 8 MG tablet  Take 1 tablet (8 mg total) by mouth at bedtime. 09/21/18   Kerin Perna, NP  ferric citrate (AURYXIA) 1 GM 210 MG(Fe) tablet Take 210-420 mg by mouth See admin instructions. Take 420 mg by mouth three times a day with meals and 210 mg with each snack    [provider]  ipratropium-albuterol (DUONEB) 0.5-2.5 (3) MG/3ML SOLN Take 3 mLs by nebulization every 6 (six) hours as needed. Patient taking differently: Take 3 mLs by nebulization every 6 (six) hours as needed (shortness of breath and wheezing).  09/21/18   Kerin Perna, NP  JULUCA 50-25 MG TABS TAKE 1 TABLET BY MOUTH DAILY WITH SUPPER. Patient taking differently: Take 1 tablet by mouth daily.  08/22/19   Carlyle Basques,  MD  lisinopril (ZESTRIL) 10 MG tablet Take 10 mg by mouth at bedtime.  09/06/18   [provider]  methimazole (TAPAZOLE) 5 MG tablet Take 1 tablet (5 mg total) by mouth 2 (two) times daily. 09/07/19   Domenic Polite, MD  montelukast (SINGULAIR) 10 MG tablet Take 10 mg by mouth at bedtime.  11/16/18   [provider]  pravastatin (PRAVACHOL) 40 MG tablet Take 1 tablet (40 mg total) by mouth at bedtime. 09/19/19   Kerin Perna, NP  sildenafil (VIAGRA) 100 MG tablet Take 100 mg by mouth daily as needed for erectile dysfunction.    [provider]  zidovudine (RETROVIR) 300 MG tablet TAKE 1 TABLET (300 MG TOTAL) BY MOUTH DAILY. 08/22/19   Carlyle Basques, MD    Allergies    Patient has no known allergies.  Review of Systems   Review of Systems  Respiratory: Positive for shortness of breath.   All other systems reviewed and are negative.   Physical Exam Updated Vital Signs BP (!) 147/86   Pulse 79   Temp 98.2 F (36.8 C) (Axillary)   Resp 15   SpO2 100%   Physical Exam Vitals and nursing note reviewed.  Constitutional:      General: He is in acute distress.     Appearance: Normal appearance. He is well-developed.  HENT:     Head: Normocephalic and atraumatic.      Right Ear: Hearing normal.     Left Ear: Hearing normal.     Nose: Nose normal.  Eyes:     Conjunctiva/sclera: Conjunctivae normal.     Pupils: Pupils are equal, round, and reactive to light.  Cardiovascular:     Rate and Rhythm: Regular rhythm. Tachycardia present.     Heart sounds: S1 normal and S2 normal. No murmur. No friction rub. No gallop.   Pulmonary:     Effort: Tachypnea, accessory muscle usage and respiratory distress present.     Breath sounds: Rales present.  Chest:     Chest wall: No tenderness.  Abdominal:     General: Bowel sounds are normal.     Palpations: Abdomen is soft.     Tenderness: There is no abdominal tenderness. There is no guarding or rebound. Negative signs include Murphy's sign and McBurney's sign.     Hernia: No hernia is present.  Musculoskeletal:        General: Normal range of motion.     Cervical back: Normal range of motion and neck supple.     Right lower leg: Edema present.     Left lower leg: Edema present.  Skin:    General: Skin is warm and dry.     Findings: No rash.  Neurological:     Mental Status: He is alert and oriented to person, place, and time.     GCS: GCS eye subscore is 4. GCS verbal subscore is 5. GCS motor subscore is 6.     Cranial Nerves: No cranial nerve deficit.     Sensory: No sensory deficit.     Coordination: Coordination normal.  Psychiatric:        Speech: Speech normal.        Behavior: Behavior normal.        Thought Content: Thought content normal.     ED Results / Procedures / Treatments   Labs (all labs ordered are listed, but only abnormal results are displayed) Labs Reviewed  CBC WITH DIFFERENTIAL/PLATELET - Abnormal; Notable for the following  components:      Result Value   RBC 3.19 (*)    Hemoglobin 10.5 (*)    HCT 33.1 (*)    MCV 103.8 (*)    Platelets 143 (*)    All other components within normal limits  BASIC METABOLIC PANEL - Abnormal; Notable for the following components:   CO2 21 (*)      Glucose, Bld 102 (*)    BUN 71 (*)    Creatinine, Ser 14.03 (*)    GFR calc non Af Amer 3 (*)    GFR calc Af Amer 4 (*)    Anion gap 20 (*)    All other components within normal limits  I-STAT CHEM 8, ED - Abnormal; Notable for the following components:   BUN 70 (*)    Creatinine, Ser 16.10 (*)    Hemoglobin 10.5 (*)    HCT 31.0 (*)    All other components within normal limits  TROPONIN I (HIGH SENSITIVITY) - Abnormal; Notable for the following components:   Troponin I (High Sensitivity) 25 (*)    All other components within normal limits  RESPIRATORY PANEL BY RT PCR (FLU A&B, COVID)  TROPONIN I (HIGH SENSITIVITY)    EKG EKG Interpretation  Date/Time:  Monday October 10 2019 01:42:40 EDT Ventricular Rate:  110 PR Interval:    QRS Duration: 93 QT Interval:  335 QTC Calculation: 454 R Axis:   68 Text Interpretation: Sinus tachycardia Left atrial enlargement RSR' in V1 or V2, probably normal variant No significant change since last tracing Confirmed by Orpah Greek [phone]) on 10/10/2019 1:52:40 AM   Radiology DG Chest Port 1 View  Result Date: 10/10/2019 CLINICAL DATA:  Shortness of breath EXAM: PORTABLE CHEST 1 VIEW COMPARISON:  September 04, 2019 FINDINGS: The heart size and mediastinal contours are unchanged. Again noted is a small right pleural effusion. Hazy interstitial opacities are seen within both lungs, right greater than left. There is slight interval worsening in the right lung compared to the prior exam. No acute osseous abnormality. Advanced right shoulder osteoarthritis is seen. IMPRESSION: Unchanged small right pleural effusion. Hazy interstitial opacities throughout both lungs, right greater than left, with slight interval worsening which could be due to asymmetric edema or infectious etiology. Electronically Signed   By: Prudencio Pair M.D.   On: 10/10/2019 02:14    Procedures Procedures (including critical care time)  Medications Ordered in  ED Medications  nitroGLYCERIN (NITROGLYN) 2 % ointment 1 inch (1 inch Topical Given 10/10/19 0230)  Chlorhexidine Gluconate Cloth 2 % PADS 6 each (has no administration in time range)  heparin injection 6,000 Units (has no administration in time range)    ED Course  I have reviewed the triage vital signs and the nursing notes.  Pertinent labs & imaging results that were available during my care of the patient were reviewed by me and considered in my medical decision making (see chart for details).    MDM Rules/Calculators/A&P                      Patient presents to the emergency department for evaluation of shortness of breath.  Patient went to sleep in his normal state of health but woke up gasping for breath.  EMS report that the patient was in significant distress upon their arrival.  He was exhibiting marked elevation of his blood pressure.  Patient had a similar presentation approximately 5 weeks ago that required additional dialysis.  Patient blood  pressure has improved.  He was administered sublingual nitroglycerin by EMS and Nitropaste was applied here.  This in conjunction with BiPAP has significantly improved his breathing and vital signs.  Patient is resting comfortably.  Lab work is at his baseline.  He has had a slightly elevated troponin which is chronic for him, no change.  No chest pain or EKG changes.  Discussed with Dr. Jonnie Finner, on call for nephrology.  Patient will be held here in the ER until he can be dialyzed this morning.  Addendum: Patient did well through the night.  Vital signs remained very stable.  He was breathing comfortably on BiPAP, was able to be taken off BiPAP, tolerating nasal cannula oxygen prior to transport for dialysis.  CRITICAL CARE Performed by: Orpah Greek   Total critical care time: 30 minutes  Critical care time was exclusive of separately billable procedures and treating other patients.  Critical care was necessary to treat or  prevent imminent or life-threatening deterioration.  Critical care was time spent personally by me on the following activities: development of treatment plan with patient and/or surrogate as well as nursing, discussions with consultants, evaluation of patient's response to treatment, examination of patient, obtaining history from patient or surrogate, ordering and performing treatments and interventions, ordering and review of laboratory studies, ordering and review of radiographic studies, pulse oximetry and re-evaluation of patient's condition.  Final Clinical Impression(s) / ED Diagnoses Final diagnoses:  Acute respiratory failure with hypoxia ([place])  Acute pulmonary edema [facility])    Rx / DC Orders ED Discharge Orders    None       Aubrei Bouchie, Gwenyth Allegra, MD 10/10/19 1275    Orpah Greek, MD 10/10/19 [phone]

## 2019-10-10 NOTE — ED Notes (Signed)
Pt returned from dialysis

## 2019-10-10 NOTE — ED Triage Notes (Signed)
Patient c/o severe SOB that began today - denies CP. Hx of CHF, dialysis (mon-wed-Fri). Pt was given 4 nitro's and CPAP was administered.

## 2019-10-10 NOTE — Procedures (Signed)
Asked to see patient for resp distress/ SOB. Pt came off "below my dry wt" on Friday, has not missed HD.  CXR + edema.  Pt on bipap in ED and improved and comes up for HD on nasal cann O2.  Exam w/ some periorbital edema and mild pretib edema, lungs clear, not in distress.  HD goal 4 L.  If stable / improved post HD will be dc'd back to ED and then most likely home. Will get post HD wt and may need edw lowered. Resume MWF outpt HD.   I was present at this dialysis session, have reviewed the session itself and made  appropriate changes Kelly Splinter MD [place] pager [phone]   10/10/2019, 8:44 AM

## 2019-10-10 NOTE — ED Notes (Signed)
Pt removed from Bi-pap and placed on nasal cannula 4LPM per [place], md.

## 2019-10-10 NOTE — ED Notes (Signed)
Patient verbalizes understanding of discharge instructions. Opportunity for questioning and answers were provided. Armband removed by staff. Patient discharged from ED.  

## 2019-10-11 ENCOUNTER — Other Ambulatory Visit: Payer: Self-pay

## 2019-10-11 ENCOUNTER — Encounter: Payer: Medicare Other | Admitting: Internal Medicine

## 2019-10-12 ENCOUNTER — Ambulatory Visit (INDEPENDENT_AMBULATORY_CARE_PROVIDER_SITE_OTHER): Payer: Medicare Other | Admitting: Internal Medicine

## 2019-10-12 ENCOUNTER — Encounter: Payer: Self-pay | Admitting: Internal Medicine

## 2019-10-12 VITALS — BP 152/78 | HR 89 | Temp 98.9°F | Ht 69.0 in | Wt 241.6 lb

## 2019-10-12 DIAGNOSIS — E0590 Thyrotoxicosis, unspecified without thyrotoxic crisis or storm: Secondary | ICD-10-CM

## 2019-10-12 NOTE — Progress Notes (Signed)
Name: Darrell Johnson  MRN/ DOB: [id number], Sep 22, 1965    Age/ Sex: 55 y.o., adult    PCP: Sonia Side., FNP   Reason for Endocrinology Evaluation: Hyperthyroidism      Date of Initial Endocrinology Evaluation: 10/12/2019     HPI: Darrell Johnson is a 54 y.o. adult with a past medical history of HTN, CHF, A.Fib,HIV ,and   ESRD on HD. The patient presented for initial endocrinology clinic visit on 10/12/2019 for consultative assistance with his hyperthyroidism.   Pt was noted to have a suppressed TSH in 08/2019 at 0.001 uIU/mL and elevated Ft4 at 3.48 ng/dL    Was on amiodarone   Denies local neck swelling  Denies palpitations or diarrhea  Has occasional anxiety   Has intentional weight loss  Methimazole 5 mg BID   No fh of thyroid  Disease    HISTORY:  Past Medical History:  Past Medical History:  Diagnosis Date  . Anemia   . Anxiety   . Arthritis    "knees" (04/13/2017)  . Asthma   . CHF (congestive heart failure) ([place])   . ESRD (end stage renal disease) on dialysis [facility])    "MWF; [facility]" (04/13/2017)  . Gout, unspecified 08/13/2009   Qualifier: Diagnosis of  By: Redmond Pulling  MD, Mateo Flow    . H1N1 influenza    March 2016  . History of blood transfusion 02/2017   "related to OR"  . HIV infection ([place]) 1980's   on ART therapy since, followed by ID clinic, complicated  by neuropathy  . Hyperlipidemia    hypertrygliceridemia determined ti be secondary to ART therpay  . HYPERTENSION 05/08/2006  . Pneumonia    "once; years ago" (04/13/2017)  . Rib fractures 01/2009  . Seizures ([place])    last seizure was in the 1990s, pt has family history of seizures; "probably related to alcohol" (04/13/2017)  . 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 Syphilis 1997   history of syphilis 1997  . Tobacco abuse     Past Surgical  History:  Past Surgical History:  Procedure Laterality Date  . AV FISTULA PLACEMENT Right 12/02/2013   Procedure: ARTERIOVENOUS (AV) FISTULA CREATION;  Surgeon: Rosetta Posner, MD;  Location: [place];  Service: Vascular;  Laterality: Right;  . AV FISTULA PLACEMENT Right 06/09/2019   Procedure: Right Arm Fistula Plication.;  Surgeon: Marty Heck, MD;  Location: [place];  Service: Vascular;  Laterality: Right;  . INCISION AND DRAINAGE ABSCESS Right 03/09/2017   Procedure: INCISION AND DRAINAGE Right Scrotal Abscess;  Surgeon: Franchot Gallo, MD;  Location: [place];  Service: Urology;  Laterality: Right;  . THROMBECTOMY Right ~ 2016   "AV fistula clotted off"      Social History:  reports that he quit smoking about 9 months ago. His smoking use included cigarettes. He has a 2.50 pack-year smoking history. He has never used smokeless tobacco. He reports that he does not drink alcohol or use drugs.  Family History: family history includes COPD in his father; Cancer in his mother; Diabetes in his brother and sister; Hypertension in his brother, father, mother, and sister.   HOME MEDICATIONS: Allergies as of 10/12/2019   No Known Allergies     Medication List       Accurate as of October 12, 2019  3:03 PM. If you have any questions, ask your nurse or doctor.        acetaminophen 500 MG tablet Commonly known as: TYLENOL Take 500-1,000 mg by mouth every 6 (six) hours as needed for mild pain or headache.   albuterol 108 (90 Base) MCG/ACT inhaler Commonly known as: VENTOLIN HFA Inhale 2 puffs into the lungs every 4 (four) hours as needed for up to 30 days for wheezing or shortness of breath.   amitriptyline 25 MG tablet Commonly known as: ELAVIL TAKE 1 TABLET BY MOUTH EVERYDAY AT BEDTIME What changed: See the new instructions.   amLODipine 10 MG tablet Commonly known as: NORVASC Take 1 tablet (10 mg total) by mouth daily. What changed: when to take this   aspirin EC 325 MG  tablet Take 1 tablet (325 mg total) by mouth daily.   Azelastine-Fluticasone 137-50 MCG/ACT Susp Place 1 spray into both nostrils daily.   budesonide-formoterol 160-4.5 MCG/ACT inhaler Commonly known as: SYMBICORT Inhale 2 puffs into the lungs 2 (two) times daily.   carvedilol 25 MG tablet Commonly known as: COREG Take 1 tablet (25 mg total) by mouth 2 (two) times daily with a meal.   cetirizine 10 MG tablet Commonly known as: ZYRTEC Take 10 mg by mouth daily as needed for allergies.   doxazosin 8 MG tablet Commonly known as: CARDURA Take 1 tablet (8 mg total) by mouth at bedtime.   ferric citrate 1 GM 210 MG(Fe) tablet Commonly known as: AURYXIA Take 210-420 mg by mouth See admin instructions. Take 420 mg by mouth three times a day with meals and 210 mg with each snack   ipratropium-albuterol 0.5-2.5 (3) MG/3ML Soln Commonly known as: DUONEB Take 3 mLs by nebulization every 6 (six) hours as needed. What changed: reasons to take this   Juluca 50-25 MG Tabs Generic drug: Dolutegravir-Rilpivirine TAKE 1 TABLET BY MOUTH DAILY WITH SUPPER. What changed: when to take this   lisinopril 10 MG tablet Commonly known as: ZESTRIL Take 10 mg by mouth at bedtime.   methimazole 5 MG tablet Commonly known as: Tapazole Take 1 tablet (5 mg total) by mouth 2 (two) times daily.   montelukast 10 MG tablet Commonly known as: SINGULAIR Take 10 mg by mouth at bedtime.   pravastatin 40 MG tablet Commonly known as: PRAVACHOL Take 1 tablet (40 mg total) by mouth at bedtime.   sildenafil 100 MG tablet Commonly known as: VIAGRA Take 100 mg by mouth daily as needed for erectile dysfunction.   zidovudine 300 MG tablet Commonly known as: RETROVIR TAKE 1 TABLET (300 MG TOTAL) BY MOUTH DAILY.         REVIEW OF SYSTEMS: A comprehensive ROS was conducted with the patient and is negative except as per HPI and below:  ROS     OBJECTIVE:  VS: BP (!) 152/78 (BP Location: Left Arm,  Patient Position: Sitting, Cuff Size: Large)   Pulse 89   Temp 98.9 F (37.2 C)   Ht 5\' 9"  (1.753 m)   Wt 241 lb 9.6 oz (109.6 kg)   SpO2 98%   BMI 35.68 kg/m    Wt Readings from Last 3 Encounters:  10/12/19 241 lb 9.6 oz (109.6 kg)  10/10/19 240 lb 8.4 oz (109.1 kg)  09/07/19 245 lb 6 oz (111.3 kg)     EXAM: General: Pt appears well and is in NAD  Hydration: Well-hydrated with moist mucous membranes and good skin turgor  Eyes: External eye exam normal without stare,  lid lag or exophthalmos.  EOM intact.  PERRL.  Ears, Nose, Throat: Hearing: Grossly intact bilaterally Dental: Good dentition  Throat: Clear without mass, erythema or exudate  Neck: General: Supple without adenopathy. Thyroid: Thyroid size normal.  No goiter or nodules appreciated. No thyroid bruit.  Lungs: Clear with good BS bilat with no rales, rhonchi, or wheezes  Heart: Auscultation: RRR.  Abdomen: Normoactive bowel sounds, soft, nontender, without masses or organomegaly palpable  Extremities: Gait and station: Normal gait  Digits and nails: No clubbing, cyanosis, petechiae, or nodes Head and neck: Normal alignment and mobility BL UE: Normal ROM and strength. BL LE: No pretibial edema normal ROM and strength.  Skin: Hair: Texture and amount normal with gender appropriate distribution Skin Inspection: No rashes, acanthosis nigricans/skin tags. No lipohypertrophy Skin Palpation: Skin temperature, texture, and thickness normal to palpation  Neuro: Cranial nerves: II - XII grossly intact  Cerebellar: Normal coordination and movement; no tremor Motor: Normal strength throughout DTRs: 2+ and symmetric in UE without delay in relaxation phase  Mental Status: Judgment, insight: Intact Orientation: Oriented to time, place, and person Memory: Intact for recent and remote events Mood and affect: No depression, anxiety, or agitation     DATA REVIEWED:   Results for SOLOMAN, MCKEITHAN (MRN [id number]) as of  10/12/2019 15:07  Ref. Range 09/07/2019 02:28  Sodium Latest Ref Range: 135 - 145 mmol/L 135  Potassium Latest Ref Range: 3.5 - 5.1 mmol/L 4.6  Chloride Latest Ref Range: 98 - 111 mmol/L 94 (L)  CO2 Latest Ref Range: 22 - 32 mmol/L 24  Glucose Latest Ref Range: 70 - 99 mg/dL 91  BUN Latest Ref Range: 6 - 20 mg/dL 72 (H)  Creatinine Latest Ref Range: 0.61 - 1.24 mg/dL 11.58 (H)  Calcium Latest Ref Range: 8.9 - 10.3 mg/dL 8.9  Anion gap Latest Ref Range: 5 - 15  17 (H)  Magnesium Latest Ref Range: 1.7 - 2.4 mg/dL 2.3  Alkaline Phosphatase Latest Ref Range: 38 - 126 U/L 57  Albumin Latest Ref Range: 3.5 - 5.0 g/dL 3.1 (L)  AST Latest Ref Range: 15 - 41 U/L 14 (L)  ALT Latest Ref Range: 0 - 44 U/L 25  Total Protein Latest Ref Range: 6.5 - 8.1 g/dL 6.3 (L)  Total Bilirubin Latest Ref Range: 0.3 - 1.2 mg/dL 0.7  GFR, Est Non African American Latest Ref Range: >60 mL/min 4 (L)  GFR, Est African American Latest Ref Range: >60 mL/min 5 (L)   Results for AZIM, GILLINGHAM (MRN [id number]) as of 10/12/2019 15:07  Ref. Range 10/10/2019 01:48  WBC Latest Ref Range: 4.0 - 10.5 K/uL 7.7  RBC Latest Ref Range: 4.22 - 5.81 MIL/uL 3.19 (L)  Hemoglobin Latest Ref Range: 13.0 - 17.0 g/dL 10.5 (L)  HCT Latest Ref Range: 39.0 - 52.0 % 33.1 (L)  MCV Latest Ref Range: 80.0 - 100.0 fL 103.8 (H)  MCH Latest Ref Range: 26.0 - 34.0 pg 32.9  MCHC Latest Ref Range: 30.0 - 36.0 g/dL 31.7  RDW Latest Ref Range: 11.5 - 15.5 % 15.4  Platelets Latest Ref Range: 150 - 400 K/uL 143 (L)  nRBC Latest Ref Range: 0.0 - 0.2 % 0.0  Neutrophils Latest Units: % 70  Lymphocytes Latest Units: % 19  Monocytes Relative Latest Units: % 7  Eosinophil Latest Units: % 3  Basophil Latest Units: % 1  Immature Granulocytes Latest Units: % 0  NEUT# Latest Ref Range: 1.7 - 7.7 K/uL 5.4  Lymphocyte #  Latest Ref Range: 0.7 - 4.0 K/uL 1.5  Monocyte # Latest Ref Range: 0.1 - 1.0 K/uL 0.6  Eosinophils Absolute Latest Ref Range: 0.0 - 0.5  K/uL 0.2  Basophils Absolute Latest Ref Range: 0.0 - 0.1 K/uL 0.0  Abs Immature Granulocytes Latest Ref Range: 0.00 - 0.07 K/uL 0.02   Results for ELDEN, BRUCATO (MRN [id number]) as of 10/13/2019 16:03  Ref. Range 10/12/2019 15:33  TSH Latest Ref Range: 0.35 - 4.50 uIU/mL <0.01 (L)  T4,Free(Direct) Latest Ref Range: 0.60 - 1.60 ng/dL 1.15     ASSESSMENT/PLAN/RECOMMENDATIONS:    1. Hyperthyroidism:   -We discussed the differential diagnosis of Graves' disease, toxic thyroid nodules, subacute thyroiditis, or amiodarone use.  -Patient is clinically euthyroid  -No local neck symptoms  -We discussed that Graves' Disease is a result of an autoimmune condition involving the thyroid.    - We discussed with pt the benefits of methimazole in the Tx of hyperthyroidism, as well as the possible side effects/complications of anti-thyroid drug Tx (specifically detailing the rare, but serious side effect of agranulocytosis). He was informed of need for regular thyroid function monitoring while on methimazole to ensure appropriate dosage without over-treatment. As well, we discussed the possible side effects of methimazole including the chance of rash, the small chance of liver irritation/juandice and the <=1 in 300-400 chance of sudden onset agranulocytosis.  We discussed importance of going to ED promptly (and stopping methimazole) if hewere to develop significant fever with severe sore throat of other evidence of acute infection.    TRAB - pending    Medications : Continue methimazole 5 mg twice daily   Follow-up in 3 months  Signed electronically by: Mack Guise, MD  [facility]  [facility] [address] [place], [place] [address] Phone: [phone] FAX: [phone]   CC: [address] [place] [address] Phone: [phone] Fax: [phone]   Return to Endocrinology clinic as below: Future  Appointments  Date Time Provider [place]  10/26/2019  2:30 PM Carlyle Basques, MD [facility]

## 2019-10-12 NOTE — Patient Instructions (Signed)
-   Continue Methimazole 5 mg TWICE daily for now, we will contact you with any changes

## 2019-10-13 ENCOUNTER — Telehealth: Payer: Self-pay | Admitting: Internal Medicine

## 2019-10-13 DIAGNOSIS — E0590 Thyrotoxicosis, unspecified without thyrotoxic crisis or storm: Secondary | ICD-10-CM | POA: Insufficient documentation

## 2019-10-13 LAB — T4, FREE: Free T4: 1.15 ng/dL (ref 0.60–1.60)

## 2019-10-13 LAB — TSH: TSH: <0.01 u[IU]/mL — ABNORMAL LOW (ref 0.35–4.50)

## 2019-10-13 MED ORDER — METHIMAZOLE 5 MG PO TABS: 5.0000 mg | ORAL_TABLET | Freq: Two times a day (BID) | ORAL | 1 refills | Status: DC

## 2019-10-13 NOTE — Telephone Encounter (Signed)
   Please let him know his thyroid function is improving, and to continue on methimazole 5 mg twice daily.   Thank you  Abby Nena Jordan, MD  [facility]  [facility] [address] [place], [place] [address] Phone: [phone] FAX: [phone]

## 2019-10-14 NOTE — Telephone Encounter (Signed)
2nd call and same recording

## 2019-10-14 NOTE — Telephone Encounter (Signed)
Attempted to call pt, received recording that stated my call could not be completed and to try later.

## 2019-10-18 LAB — TRAB (TSH RECEPTOR BINDING ANTIBODY): TRAB: 2.73 IU/L — ABNORMAL HIGH (ref <=2.00)

## 2019-10-19 NOTE — Telephone Encounter (Signed)
Letter sent to patient with results.

## 2019-10-19 NOTE — Telephone Encounter (Signed)
3rd call same recording.

## 2019-10-19 NOTE — Progress Notes (Signed)
Tried to call patient 3 times.  Letter sent with results.

## 2019-10-26 ENCOUNTER — Other Ambulatory Visit: Payer: Self-pay | Admitting: Internal Medicine

## 2019-10-26 ENCOUNTER — Ambulatory Visit (INDEPENDENT_AMBULATORY_CARE_PROVIDER_SITE_OTHER): Payer: Medicare Other | Admitting: Internal Medicine

## 2019-10-26 ENCOUNTER — Other Ambulatory Visit: Payer: Self-pay

## 2019-10-26 ENCOUNTER — Encounter: Payer: Self-pay | Admitting: Internal Medicine

## 2019-10-26 VITALS — BP 158/98 | HR 85 | Temp 98.8°F | Wt 254.0 lb

## 2019-10-26 DIAGNOSIS — B20 Human immunodeficiency virus [HIV] disease: Secondary | ICD-10-CM

## 2019-10-26 DIAGNOSIS — E079 Disorder of thyroid, unspecified: Secondary | ICD-10-CM | POA: Diagnosis not present

## 2019-10-26 DIAGNOSIS — N186 End stage renal disease: Secondary | ICD-10-CM | POA: Diagnosis not present

## 2019-10-26 DIAGNOSIS — Z992 Dependence on renal dialysis: Secondary | ICD-10-CM

## 2019-10-26 DIAGNOSIS — I10 Essential (primary) hypertension: Secondary | ICD-10-CM | POA: Diagnosis not present

## 2019-10-26 NOTE — Progress Notes (Signed)
Patient ID: Darrell Johnson, adult   DOB: 09-17-1965, 54 y.o.   MRN: [id number]  HPI 54yo M with HIV disease, ESRD, and HTN. He reports that he has been Recently diagnosed with hyperthyroidism getting care through endocrinologist, now on tapazole for the past month.  Just finished hemodialysis- hasn't taken his BP meds today yet  Intentional dieting loss 10 lb.  Has had 2 doses of covid-19 - 2 weeks ago  Outpatient Encounter Medications as of 10/26/2019  Medication Sig  . acetaminophen (TYLENOL) 500 MG tablet Take 500-1,000 mg by mouth every 6 (six) hours as needed for mild pain or headache.  Marland Kitchen amitriptyline (ELAVIL) 25 MG tablet TAKE 1 TABLET BY MOUTH EVERYDAY AT BEDTIME (Patient taking differently: Take 25 mg by mouth at bedtime. )  . amLODipine (NORVASC) 10 MG tablet Take 1 tablet (10 mg total) by mouth daily. (Patient taking differently: Take 10 mg by mouth at bedtime. )  . aspirin EC 325 MG tablet Take 1 tablet (325 mg total) by mouth daily.  . Azelastine-Fluticasone 137-50 MCG/ACT SUSP Place 1 spray into both nostrils daily.  . budesonide-formoterol (SYMBICORT) 160-4.5 MCG/ACT inhaler Inhale 2 puffs into the lungs 2 (two) times daily.  . carvedilol (COREG) 25 MG tablet Take 1 tablet (25 mg total) by mouth 2 (two) times daily with a meal.  . cetirizine (ZYRTEC) 10 MG tablet Take 10 mg by mouth daily as needed for allergies.  Marland Kitchen doxazosin (CARDURA) 8 MG tablet Take 1 tablet (8 mg total) by mouth at bedtime.  . ferric citrate (AURYXIA) 1 GM 210 MG(Fe) tablet Take 210-420 mg by mouth See admin instructions. Take 420 mg by mouth three times a day with meals and 210 mg with each snack  . ipratropium-albuterol (DUONEB) 0.5-2.5 (3) MG/3ML SOLN Take 3 mLs by nebulization every 6 (six) hours as needed. (Patient taking differently: Take 3 mLs by nebulization every 6 (six) hours as needed (shortness of breath and wheezing). )  . JULUCA 50-25 MG TABS TAKE 1 TABLET BY MOUTH DAILY WITH SUPPER.  (Patient taking differently: Take 1 tablet by mouth daily. )  . lisinopril (ZESTRIL) 10 MG tablet Take 10 mg by mouth at bedtime.   . methimazole (TAPAZOLE) 5 MG tablet Take 1 tablet (5 mg total) by mouth 2 (two) times daily.  . montelukast (SINGULAIR) 10 MG tablet Take 10 mg by mouth at bedtime.   . pravastatin (PRAVACHOL) 40 MG tablet Take 1 tablet (40 mg total) by mouth at bedtime.  . sildenafil (VIAGRA) 100 MG tablet Take 100 mg by mouth daily as needed for erectile dysfunction.  . zidovudine (RETROVIR) 300 MG tablet TAKE 1 TABLET (300 MG TOTAL) BY MOUTH DAILY.  Marland Kitchen albuterol (PROVENTIL HFA;VENTOLIN HFA) 108 (90 Base) MCG/ACT inhaler Inhale 2 puffs into the lungs every 4 (four) hours as needed for up to 30 days for wheezing or shortness of breath.   No facility-administered encounter medications on file as of 10/26/2019.     Patient Active Problem List   Diagnosis Date Noted  . Hyperthyroidism 10/13/2019  . Acute on chronic respiratory failure with hypoxemia ([place]) 09/05/2019  . GERD (gastroesophageal reflux disease) 02/14/2019  . Depression 02/14/2019  . Elevated troponin 02/14/2019  . Acute on chronic diastolic (congestive) heart failure ([place]) 02/14/2019  . Pulmonary edema 01/31/2019  . Moderate mitral regurgitation   . Acute respiratory failure with hypoxia ([place]) 03/29/2017  . Trigger finger of right hand 09/11/2016  . Hyperlipidemia 03/31/2016  . Anxiety state  03/11/2016  . Lumbar radiculopathy 01/28/2016  . Seasonal allergies   . Healthcare maintenance 05/03/2015  . Anemia in chronic kidney disease 08/30/2014  . ESRD on dialysis ([place]) 12/27/2013  . History of syphilis 11/07/2013  . Arthritis 09/09/2013  . Tobacco abuse 09/07/2013  . Chronic pain disorder 04/28/2013  . Gout 08/13/2009  . HIV disease ([place]) 05/08/2006  . Essential hypertension 05/08/2006  . SEIZURE DISORDER 05/08/2006     There are no preventive care reminders to display for this patient.   Review of  Systems Review of Systems  Constitutional: Negative for fever, chills, diaphoresis, activity change, appetite change, fatigue and unexpected weight change.  HENT: Negative for congestion, sore throat, rhinorrhea, sneezing, trouble swallowing and sinus pressure.  Eyes: Negative for photophobia and visual disturbance.  Respiratory: Negative for cough, chest tightness, shortness of breath, wheezing and stridor.  Cardiovascular: Negative for chest pain, palpitations and leg swelling.  Gastrointestinal: Negative for nausea, vomiting, abdominal pain, diarrhea, constipation, blood in stool, abdominal distention and anal bleeding.  Genitourinary: Negative for dysuria, hematuria, flank pain and difficulty urinating.  Musculoskeletal: Negative for myalgias, back pain, joint swelling, arthralgias and gait problem.  Skin: Negative for color change, pallor, rash and wound.  Neurological: Negative for dizziness, tremors, weakness and light-headedness.  Hematological: Negative for adenopathy. Does not bruise/bleed easily.  Psychiatric/Behavioral: Negative for behavioral problems, confusion, sleep disturbance, dysphoric mood, decreased concentration and agitation.    Physical Exam   BP (!) 158/98   Pulse 85   Temp 98.8 F (37.1 C) (Oral)   Wt 254 lb (115.2 kg)   BMI 37.51 kg/m   Physical Exam  Constitutional: He is oriented to person, place, and time. He appears well-developed and well-nourished. No distress.  HENT:  Mouth/Throat: Oropharynx is clear and moist. No oropharyngeal exudate.  Cardiovascular: Normal rate, regular rhythm and normal heart sounds. Exam reveals no gallop and no friction rub.  No murmur heard.  Pulmonary/Chest: Effort normal and breath sounds normal. No respiratory distress. He has no wheezes.  Abdominal: Soft. Bowel sounds are normal. He exhibits no distension. There is no tenderness.  Lymphadenopathy:  He has no cervical adenopathy.  Neurological: He is alert and oriented  to person, place, and time.  Skin: Skin is warm and dry. No rash noted. No erythema.  Psychiatric: He has a normal mood and affect. His behavior is normal.    Lab Results  Component Value Date   CD4TCELL 38 08/23/2019   Lab Results  Component Value Date   CD4TABS 637 08/23/2019   CD4TABS 428 01/06/2019   CD4TABS 670 11/03/2017   Lab Results  Component Value Date   HIV1RNAQUANT <20 NOT DETECTED 08/23/2019   Lab Results  Component Value Date   HEPBSAB POS (A) 08/17/2014   Lab Results  Component Value Date   LABRPR NON-REACTIVE 01/06/2019    CBC Lab Results  Component Value Date   WBC 7.7 10/10/2019   RBC 3.19 (L) 10/10/2019   HGB 10.5 (L) 10/10/2019   HCT 31.0 (L) 10/10/2019   PLT 143 (L) 10/10/2019   MCV 103.8 (H) 10/10/2019   MCH 32.9 10/10/2019   MCHC 31.7 10/10/2019   RDW 15.4 10/10/2019   LYMPHSABS 1.5 10/10/2019   MONOABS 0.6 10/10/2019   EOSABS 0.2 10/10/2019    BMET Lab Results  Component Value Date   NA 136 10/10/2019   K 4.7 10/10/2019   CL 102 10/10/2019   CO2 21 (L) 10/10/2019   GLUCOSE 99 10/10/2019   BUN  70 (H) 10/10/2019   CREATININE 16.10 (H) 10/10/2019   CALCIUM 9.4 10/10/2019   GFRNONAA 3 (L) 10/10/2019   GFRAA 4 (L) 10/10/2019      Assessment and Plan  HIV disease= doing well, continue on current regimen  ESRD on HD = continue to be adherent, not missing sessions  Hypertension = not symptomatic, usually is within goal per his report. Not yet taken his meds  Thyroid disorder = continue on tapazole.

## 2019-11-01 MED FILL — ZIDOVUDINE 300 MG TABLET: 300 | 30 days supply | Qty: 30 | Fill #0

## 2019-11-01 MED FILL — JULUCA 50-25 MG TAB: 50-25 | 30 days supply | Qty: 30 | Fill #0

## 2019-11-04 NOTE — Progress Notes (Signed)
error 

## 2019-12-16 ENCOUNTER — Other Ambulatory Visit: Payer: Self-pay | Admitting: Internal Medicine

## 2019-12-16 DIAGNOSIS — F411 Generalized anxiety disorder: Secondary | ICD-10-CM

## 2019-12-22 MED FILL — ZIDOVUDINE 300 MG TABLET: 300 | 30 days supply | Qty: 30 | Fill #2

## 2019-12-22 MED FILL — JULUCA 50-25 MG TAB: 50-25 | 30 days supply | Qty: 30 | Fill #2

## 2020-01-17 ENCOUNTER — Ambulatory Visit: Payer: Medicare Other | Admitting: Internal Medicine

## 2020-01-17 MED FILL — ZIDOVUDINE 300 MG TABLET: 300 | 30 days supply | Qty: 30 | Fill #3

## 2020-01-17 MED FILL — JULUCA 50-25 MG TAB: 50-25 | 30 days supply | Qty: 30 | Fill #3

## 2020-01-19 ENCOUNTER — Ambulatory Visit: Payer: Medicare Other | Admitting: Internal Medicine

## 2020-02-09 MED FILL — ZIDOVUDINE 300 MG TABLET: 300 | 30 days supply | Qty: 30 | Fill #4

## 2020-02-09 MED FILL — JULUCA 50-25 MG TAB: 50-25 | 30 days supply | Qty: 30 | Fill #4

## 2020-02-13 ENCOUNTER — Ambulatory Visit (INDEPENDENT_AMBULATORY_CARE_PROVIDER_SITE_OTHER): Payer: Medicare Other | Admitting: Internal Medicine

## 2020-02-13 ENCOUNTER — Encounter: Payer: Self-pay | Admitting: Internal Medicine

## 2020-02-13 ENCOUNTER — Other Ambulatory Visit: Payer: Self-pay

## 2020-02-13 VITALS — BP 148/78 | HR 91 | Ht 69.0 in | Wt 261.8 lb

## 2020-02-13 DIAGNOSIS — E0590 Thyrotoxicosis, unspecified without thyrotoxic crisis or storm: Secondary | ICD-10-CM

## 2020-02-13 LAB — T4, FREE: Free T4: 0.83 ng/dL (ref 0.60–1.60)

## 2020-02-13 LAB — TSH: TSH: 2.3 u[IU]/mL (ref 0.35–4.50)

## 2020-02-13 NOTE — Patient Instructions (Signed)
-   Continue Methimazole 5 mg Twice daily until you hear otherwise from [place]

## 2020-02-13 NOTE — Progress Notes (Signed)
Name: Darrell Johnson  MRN/ DOB: [id number], [birth year]    Age/ Sex: 54 y.o., adult     PCP: Sonia Side., FNP   Reason for Endocrinology Evaluation: Hyperthyroidism     Initial Endocrinology Clinic Visit: 10/12/2019    PATIENT IDENTIFIER: Darrell Johnson is a 54 y.o., adult with a past medical history of HTN, CHF, A.Fib,HIV ,and   ESRD on HD. He has followed with [facility] since 10/12/2019 for consultative assistance with management of his hyperthyroidism .   HISTORICAL SUMMARY:  Pt was noted to have a suppressed TSH in 08/2019 at 0.001 uIU/mL and elevated Ft4 at 3.48 ng/dL  Was on amiodarone   Methimazole 5 mg BID started by the primary team   No fh of thyroid  Disease   SUBJECTIVE:   Today (02/13/2020):  Darrell Johnson is here for a follow up on hyperthyroidism.   Pt has been noted with weight gain  Has occasional constipation  Denies depression or palpitations   Denies local neck swelling    Pt continues with methimazole 5 mg BID    ROS:  As per HPI.   HISTORY:  Past Medical History:  Past Medical History:  Diagnosis Date   Anemia    Anxiety    Arthritis    "knees" (04/13/2017)   Asthma    CHF (congestive heart failure) ([place])    ESRD (end stage renal disease) on dialysis ([place])    "MWF; [facility]" (04/13/2017)   Gout, unspecified 08/13/2009   Qualifier: Diagnosis of  By: Redmond Pulling  MD, Valerie     H1N1 influenza    March 2016   History of blood transfusion 02/2017   "related to OR"   HIV infection ([place]) 1980's   on ART therapy since, followed by ID clinic, complicated  by neuropathy   Hyperlipidemia    hypertrygliceridemia determined ti be secondary to ART therpay   HYPERTENSION 05/08/2006   Pneumonia    "once; years ago" (04/13/2017)   Rib fractures 01/2009   Seizures ([place])    last seizure was in the 1990s, pt has family history of seizures; "probably related to alcohol" (04/13/2017)   Sexually transmitted disease     gonorrhea and trichomonas, penile condylomata - s/p circu,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 that was the reason for her at all as if she is a   Syphilis 1997   history of syphilis 1997   Tobacco abuse    Past Surgical History:  Past Surgical History:  Procedure Laterality Date   AV FISTULA PLACEMENT Right 12/02/2013   Procedure: ARTERIOVENOUS (AV) FISTULA CREATION;  Surgeon: Rosetta Posner, MD;  Location: [facility];  Service: Vascular;  Laterality: Right;   AV FISTULA PLACEMENT Right 06/09/2019   Procedure: Right Arm Fistula Plication.;  Surgeon: Marty Heck, MD;  Location: [facility];  Service: Vascular;  Laterality: Right;   INCISION AND DRAINAGE ABSCESS Right 03/09/2017   Procedure: INCISION AND DRAINAGE Right Scrotal Abscess;  Surgeon: Franchot Gallo, MD;  Location: [facility];  Service: Urology;  Laterality: Right;   THROMBECTOMY Right ~ 2016   "AV fistula clotted off"    Social History:  reports that he quit smoking about 13 months ago. His smoking use included cigarettes. He has a 2.50 pack-year smoking history. He has never used smokeless tobacco. He reports that he does not drink alcohol and does not use drugs. Family History:  Family History  Problem Relation Age of Onset   Cancer Mother  Hypertension Mother    COPD Father    Hypertension Father    Diabetes Sister    Hypertension Sister    Diabetes Brother    Hypertension Brother    Stroke Neg Hx      HOME MEDICATIONS: Allergies as of 02/13/2020   No Known Allergies     Medication List       Accurate as of February 13, 2020  1:39 PM. If you have any questions, ask your nurse or doctor.        acetaminophen 500 MG tablet Commonly known as: TYLENOL Take 500-1,000 mg by mouth every 6 (six) hours as needed for mild pain or headache.   albuterol 108 (90 Base) MCG/ACT inhaler Commonly known as: VENTOLIN HFA Inhale 2 puffs into the lungs every 4 (four) hours as needed for up to 30 days for  wheezing or shortness of breath.   amitriptyline 25 MG tablet Commonly known as: ELAVIL TAKE 1 TABLET BY MOUTH EVERYDAY AT BEDTIME   amLODipine 10 MG tablet Commonly known as: NORVASC Take 1 tablet (10 mg total) by mouth daily. What changed: when to take this   aspirin EC 325 MG tablet Take 1 tablet (325 mg total) by mouth daily.   Azelastine-Fluticasone 137-50 MCG/ACT Susp Place 1 spray into both nostrils daily.   budesonide-formoterol 160-4.5 MCG/ACT inhaler Commonly known as: SYMBICORT Inhale 2 puffs into the lungs 2 (two) times daily.   carvedilol 25 MG tablet Commonly known as: COREG Take 1 tablet (25 mg total) by mouth 2 (two) times daily with a meal.   cetirizine 10 MG tablet Commonly known as: ZYRTEC Take 10 mg by mouth daily as needed for allergies.   doxazosin 8 MG tablet Commonly known as: CARDURA Take 1 tablet (8 mg total) by mouth at bedtime.   ferric citrate 1 GM 210 MG(Fe) tablet Commonly known as: AURYXIA Take 210-420 mg by mouth See admin instructions. Take 420 mg by mouth three times a day with meals and 210 mg with each snack   ipratropium-albuterol 0.5-2.5 (3) MG/3ML Soln Commonly known as: DUONEB Take 3 mLs by nebulization every 6 (six) hours as needed. What changed: reasons to take this   Juluca 50-25 MG Tabs Generic drug: Dolutegravir-Rilpivirine TAKE 1 TABLET BY MOUTH DAILY WITH SUPPER.   lisinopril 10 MG tablet Commonly known as: ZESTRIL Take 10 mg by mouth at bedtime.   methimazole 5 MG tablet Commonly known as: Tapazole Take 1 tablet (5 mg total) by mouth 2 (two) times daily.   montelukast 10 MG tablet Commonly known as: SINGULAIR Take 10 mg by mouth at bedtime.   pravastatin 40 MG tablet Commonly known as: PRAVACHOL Take 1 tablet (40 mg total) by mouth at bedtime.   sildenafil 100 MG tablet Commonly known as: VIAGRA Take 100 mg by mouth daily as needed for erectile dysfunction.   zidovudine 300 MG tablet Commonly known as:  RETROVIR TAKE 1 TABLET (300 MG TOTAL) BY MOUTH DAILY.         OBJECTIVE:   PHYSICAL EXAM: VS: BP (!) 148/78 (BP Location: Left Arm, Patient Position: Sitting, Cuff Size: Large)    Pulse 91    Ht 5\' 9"  (1.753 m)    Wt (!) 261 lb 12.8 oz (118.8 kg)    SpO2 97%    BMI 38.66 kg/m    EXAM: General: Pt appears well and is in NAD  Neck: General: Supple without adenopathy. Thyroid: Thyroid size normal.  No goiter or nodules appreciated.  Lungs: Clear with good BS bilat with no rales, rhonchi, or wheezes  Heart: Auscultation: RRR.  Abdomen: Normoactive bowel sounds, soft, nontender, without masses or organomegaly palpable  Extremities:  BL LE: No pretibial edema normal ROM and strength.  Mental Status: Judgment, insight: Intact Orientation: Oriented to time, place, and person Memory: Intact for recent and remote events Mood and affect: No depression, anxiety, or agitation     DATA REVIEWED: Results for AHMERE, HEMENWAY (MRN [id number]) as of 02/14/2020 10:57  Ref. Range 02/13/2020 13:45  TSH Latest Ref Range: 0.35 - 4.50 uIU/mL 2.30  T4,Free(Direct) Latest Ref Range: 0.60 - 1.60 ng/dL 0.83    Results for POPE, BRUNTY (MRN [id number]) as of 02/13/2020 13:26  Ref. Range 10/12/2019 15:33  TRAB Latest Ref Range: <=2.00 IU/L 2.73 (H)    ASSESSMENT / PLAN / RECOMMENDATIONS:   1. Hyperthyroidism secondary to Graves' Disease  :  - Most likely secondary to Graves' Disease with slight elevation in TRAB level  - No local neck symptoms  - tolerating methimazole well without side effects.  - TSH trending up and FT4 trending down, will adjust methimazole as below      Medications   Decrease Methimazole 5 mg , 1.5 tabs daily   2.Graves' Disease:   - No evidence of extrathyroidal manifestations of graves' disease   F/U in 3 months  Signed electronically by: Mack Guise, MD  [facility]  [facility] [address] [place], [place] [address] Phone: [phone] FAX: [phone]      CC: [address] [place] [address] Phone: [phone]  Fax: [phone]   Return to Endocrinology clinic as below: Future Appointments  Date Time Provider [place]  05/01/2020  1:45 PM Carlyle Basques, MD [facility]

## 2020-02-14 ENCOUNTER — Telehealth: Payer: Self-pay | Admitting: Internal Medicine

## 2020-02-14 MED ORDER — METHIMAZOLE 5 MG PO TABS: 7.5000 mg | ORAL_TABLET | Freq: Every day | ORAL | 1 refills | Status: DC

## 2020-02-14 NOTE — Telephone Encounter (Signed)
Attempted to call pt, he does not have a vm set up so will try again later

## 2020-02-14 NOTE — Telephone Encounter (Signed)
Please let him know his thyroid is normal, and to reduce methimazole from 2 tablets daily to [place] daily ( he can taken them together)    Thanks Abby Nena Jordan, MD  [facility] Endocrinology  [facility] [address] [place], [place] [address] Phone: [phone] FAX: [phone]

## 2020-02-14 NOTE — Telephone Encounter (Signed)
Pt informed

## 2020-02-16 ENCOUNTER — Other Ambulatory Visit: Payer: Self-pay | Admitting: Internal Medicine

## 2020-03-06 MED FILL — ZIDOVUDINE 300 MG TABLET: 300 | 30 days supply | Qty: 30 | Fill #5

## 2020-03-06 MED FILL — JULUCA 50-25 MG TAB: 50-25 | 30 days supply | Qty: 30 | Fill #5

## 2020-03-15 ENCOUNTER — Other Ambulatory Visit (INDEPENDENT_AMBULATORY_CARE_PROVIDER_SITE_OTHER): Payer: Self-pay | Admitting: Primary Care

## 2020-03-16 ENCOUNTER — Other Ambulatory Visit (INDEPENDENT_AMBULATORY_CARE_PROVIDER_SITE_OTHER): Payer: Self-pay | Admitting: Primary Care

## 2020-03-16 DIAGNOSIS — I10 Essential (primary) hypertension: Secondary | ICD-10-CM

## 2020-03-18 ENCOUNTER — Emergency Department (HOSPITAL_COMMUNITY)
Admission: EM | Admit: 2020-03-18 | Discharge: 2020-03-18 | Disposition: A | Discharge status: Home / Self Care | Payer: Medicare Other | Attending: Emergency Medicine | Admitting: Emergency Medicine

## 2020-03-18 ENCOUNTER — Encounter (HOSPITAL_COMMUNITY): Payer: Self-pay | Admitting: *Deleted

## 2020-03-18 ENCOUNTER — Emergency Department (HOSPITAL_COMMUNITY): Payer: Medicare Other

## 2020-03-18 ENCOUNTER — Other Ambulatory Visit: Payer: Self-pay

## 2020-03-18 DIAGNOSIS — J45909 Unspecified asthma, uncomplicated: Secondary | ICD-10-CM | POA: Diagnosis not present

## 2020-03-18 DIAGNOSIS — Z21 Asymptomatic human immunodeficiency virus [HIV] infection status: Secondary | ICD-10-CM | POA: Diagnosis not present

## 2020-03-18 DIAGNOSIS — I132 Hypertensive heart and chronic kidney disease with heart failure and with stage 5 chronic kidney disease, or end stage renal disease: Secondary | ICD-10-CM | POA: Diagnosis not present

## 2020-03-18 DIAGNOSIS — R0602 Shortness of breath: Secondary | ICD-10-CM

## 2020-03-18 DIAGNOSIS — I5033 Acute on chronic diastolic (congestive) heart failure: Secondary | ICD-10-CM | POA: Insufficient documentation

## 2020-03-18 DIAGNOSIS — Z992 Dependence on renal dialysis: Secondary | ICD-10-CM | POA: Insufficient documentation

## 2020-03-18 DIAGNOSIS — N186 End stage renal disease: Secondary | ICD-10-CM | POA: Insufficient documentation

## 2020-03-18 DIAGNOSIS — Z20822 Contact with and (suspected) exposure to covid-19: Secondary | ICD-10-CM | POA: Insufficient documentation

## 2020-03-18 DIAGNOSIS — Z87891 Personal history of nicotine dependence: Secondary | ICD-10-CM | POA: Insufficient documentation

## 2020-03-18 DIAGNOSIS — Z79899 Other long term (current) drug therapy: Secondary | ICD-10-CM | POA: Insufficient documentation

## 2020-03-18 LAB — CBC
HCT: 36.2 % — ABNORMAL LOW (ref 39.0–52.0)
Hemoglobin: 11.9 g/dL — ABNORMAL LOW (ref 13.0–17.0)
MCH: 33.5 pg (ref 26.0–34.0)
MCHC: 32.9 g/dL (ref 30.0–36.0)
MCV: 102 fL — ABNORMAL HIGH (ref 80.0–100.0)
Platelets: 192 10*3/uL (ref 150–400)
RBC: 3.55 MIL/uL — ABNORMAL LOW (ref 4.22–5.81)
RDW: 13.7 % (ref 11.5–15.5)
WBC: 6.5 10*3/uL (ref 4.0–10.5)
nRBC: 0 % (ref 0.0–0.2)

## 2020-03-18 LAB — BASIC METABOLIC PANEL
Anion gap: 15 (ref 5–15)
BUN: 27 mg/dL — ABNORMAL HIGH (ref 6–20)
CO2: 25 mmol/L (ref 22–32)
Calcium: 9 mg/dL (ref 8.9–10.3)
Chloride: 92 mmol/L — ABNORMAL LOW (ref 98–111)
Creatinine, Ser: 11.62 mg/dL — ABNORMAL HIGH (ref 0.61–1.24)
GFR calc Af Amer: 5 mL/min — ABNORMAL LOW (ref >60)
GFR calc non Af Amer: 4 mL/min — ABNORMAL LOW (ref >60)
Glucose, Bld: 92 mg/dL (ref 70–99)
Potassium: 4.3 mmol/L (ref 3.5–5.1)
Sodium: 132 mmol/L — ABNORMAL LOW (ref 135–145)

## 2020-03-18 LAB — TROPONIN I (HIGH SENSITIVITY)
Troponin I (High Sensitivity): 28 ng/L — ABNORMAL HIGH (ref <18)
Troponin I (High Sensitivity): 27 ng/L — ABNORMAL HIGH (ref <18)

## 2020-03-18 LAB — BRAIN NATRIURETIC PEPTIDE: B Natriuretic Peptide: 32.9 pg/mL (ref 0.0–100.0)

## 2020-03-18 LAB — SARS CORONAVIRUS 2 BY RT PCR (HOSPITAL ORDER, PERFORMED IN ~~LOC~~ HOSPITAL LAB): SARS Coronavirus 2: NEGATIVE

## 2020-03-18 NOTE — Discharge Instructions (Addendum)
You were seen in the emergency room for evaluation of shortness of breath.  Your Covid testing was negative and your lab work did not show any evidence of heart attack or congestive heart failure.  Please keep your scheduled appointment for dialysis tomorrow.  Return to the emergency department for any worsening or concerning symptoms.

## 2020-03-18 NOTE — ED Notes (Signed)
Dizziness  For 2 days  Dialysis pt just dialyzed yesterday    Dialysis fistula rt arm

## 2020-03-18 NOTE — ED Triage Notes (Signed)
Pt is here by ems from home for 3 episodes of sob and near syncope with dizziness and chest discomfort today.  HX of CHF and HD.  He is had an extra HD appointment this week to remove excess fluid.  Denies missing any meds.  Pain free right now

## 2020-03-18 NOTE — ED Provider Notes (Signed)
[facility] EMERGENCY DEPARTMENT Provider Note   CSN: [id number] Arrival date & time: 03/18/20  1529     History Chief Complaint  Patient presents with   Shortness of Breath    Darrell Johnson is a 54 y.o. adult.  He has a history of end-stage renal disease and gets dialysis Monday Wednesday Friday.  He felt like they gave him back to much fluid Friday and had another run of dialysis yesterday.  He said he is felt more short of breath like he is got too much fluid on board.  Lightheaded.  Denies any chest pain but said he has been belching more.  No swelling in his legs.  No change in his medications.  No fevers or chills.  The history is provided by the patient.  Shortness of Breath Severity:  Moderate Onset quality:  Gradual Duration:  1 day Timing:  Intermittent Progression:  Unchanged Chronicity:  Recurrent Context: activity   Relieved by:  Rest Worsened by:  Activity Ineffective treatments:  None tried Associated symptoms: chest pain   Associated symptoms: no abdominal pain, no cough, no diaphoresis, no fever, no hemoptysis, no neck pain, no rash, no sore throat, no sputum production and no vomiting        Past Medical History:  Diagnosis Date   Anemia    Anxiety    Arthritis    "knees" (04/13/2017)   Asthma    CHF (congestive heart failure) ([facility])    ESRD (end stage renal disease) on dialysis ([place])    "MWF; [facility]" (04/13/2017)   Gout, unspecified 08/13/2009   Qualifier: Diagnosis of  By: Redmond Pulling  MD, Valerie     H1N1 influenza    March 2016   History of blood transfusion 02/2017   "related to OR"   HIV infection ([place]) 1980's   on ART therapy since, followed by ID clinic, complicated  by neuropathy   Hyperlipidemia    hypertrygliceridemia determined ti be secondary to ART therpay   HYPERTENSION 05/08/2006   Pneumonia    "once; years ago" (04/13/2017)   Rib fractures 01/2009   Seizures ([place])    last seizure was in the  1990s, pt has family history of seizures; "probably related to alcohol" (04/13/2017)   Sexually transmitted disease    gonorrhea and trichomonas, penile condylomata - s/p circu,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 that was the reason for her at all as if she is a   Syphilis 1997   history of syphilis 1997   Tobacco abuse     Patient Active Problem List   Diagnosis Date Noted   Hyperthyroidism 10/13/2019   Acute on chronic respiratory failure with hypoxemia ([place]) 09/05/2019   GERD (gastroesophageal reflux disease) 02/14/2019   Depression 02/14/2019   Elevated troponin 02/14/2019   Acute on chronic diastolic (congestive) heart failure ([place]) 02/14/2019   Pulmonary edema 01/31/2019   Moderate mitral regurgitation    Acute respiratory failure with hypoxia ([place]) 03/29/2017   Trigger finger of right hand 09/11/2016   Hyperlipidemia 03/31/2016   Anxiety state 03/11/2016   Lumbar radiculopathy 01/28/2016   Seasonal allergies    Healthcare maintenance 05/03/2015   Anemia in chronic kidney disease 08/30/2014   ESRD on dialysis ([place]) 12/27/2013   History of syphilis 11/07/2013   Arthritis 09/09/2013   Tobacco abuse 09/07/2013   Chronic pain disorder 04/28/2013   Gout 08/13/2009   HIV disease ([place]) 05/08/2006   Essential hypertension 05/08/2006   SEIZURE DISORDER 05/08/2006    Past  Surgical History:  Procedure Laterality Date   AV FISTULA PLACEMENT Right 12/02/2013   Procedure: ARTERIOVENOUS (AV) FISTULA CREATION;  Surgeon: Rosetta Posner, MD;  Location: [facility];  Service: Vascular;  Laterality: Right;   AV FISTULA PLACEMENT Right 06/09/2019   Procedure: Right Arm Fistula Plication.;  Surgeon: Marty Heck, MD;  Location: [facility];  Service: Vascular;  Laterality: Right;   INCISION AND DRAINAGE ABSCESS Right 03/09/2017   Procedure: INCISION AND DRAINAGE Right Scrotal Abscess;  Surgeon: Franchot Gallo, MD;  Location: [facility];  Service: Urology;   Laterality: Right;   THROMBECTOMY Right ~ 2016   "AV fistula clotted off"       Family History  Problem Relation Age of Onset   Cancer Mother    Hypertension Mother    COPD Father    Hypertension Father    Diabetes Sister    Hypertension Sister    Diabetes Brother    Hypertension Brother    Stroke Neg Hx     Social History   Tobacco Use   Smoking status: Former Smoker    Packs/day: 0.10    Years: 25.00    Pack years: 2.50    Types: Cigarettes    Quit date: 12/20/2018    Years since quitting: 1.2   Smokeless tobacco: Never Used   Tobacco comment: "quitting"  Vaping Use   Vaping Use: Some days  Substance Use Topics   Alcohol use: No    Alcohol/week: 0.0 standard drinks    Comment: 04/13/2017 "quit ~ 2014"   Drug use: No    Home Medications Prior to Admission medications   Medication Sig Start Date End Date Taking? Authorizing Provider  acetaminophen (TYLENOL) 500 MG tablet Take 500-1,000 mg by mouth every 6 (six) hours as needed for mild pain or headache.    [provider]  albuterol (PROVENTIL HFA;VENTOLIN HFA) 108 (90 Base) MCG/ACT inhaler Inhale 2 puffs into the lungs every 4 (four) hours as needed for up to 30 days for wheezing or shortness of breath. 09/21/18 09/05/19  Kerin Perna, NP  amitriptyline (ELAVIL) 25 MG tablet TAKE 1 TABLET BY MOUTH EVERYDAY AT BEDTIME 12/20/19   Carlyle Basques, MD  amLODipine (NORVASC) 10 MG tablet Take 1 tablet (10 mg total) by mouth daily. Patient taking differently: Take 10 mg by mouth at bedtime.  09/21/18   Kerin Perna, NP  aspirin EC 325 MG tablet Take 1 tablet (325 mg total) by mouth daily. 09/07/19 09/06/20  Domenic Polite, MD  Azelastine-Fluticasone [phone] MCG/ACT SUSP Place 1 spray into both nostrils daily.    [provider]  budesonide-formoterol (SYMBICORT) 160-4.5 MCG/ACT inhaler Inhale 2 puffs into the lungs 2 (two) times daily. 09/21/18   Kerin Perna, NP  carvedilol  (COREG) 25 MG tablet Take 1 tablet (25 mg total) by mouth 2 (two) times daily with a meal. 09/21/18   Kerin Perna, NP  cetirizine (ZYRTEC) 10 MG tablet Take 10 mg by mouth daily as needed for allergies.    [provider]  doxazosin (CARDURA) 8 MG tablet Take 1 tablet (8 mg total) by mouth at bedtime. 09/21/18   Kerin Perna, NP  ferric citrate (AURYXIA) 1 GM 210 MG(Fe) tablet Take 210-420 mg by mouth See admin instructions. Take 420 mg by mouth three times a day with meals and 210 mg with each snack    [provider]  ipratropium-albuterol (DUONEB) 0.5-2.5 (3) MG/3ML SOLN Take 3 mLs by nebulization every 6 (  six) hours as needed. Patient taking differently: Take 3 mLs by nebulization every 6 (six) hours as needed (shortness of breath and wheezing).  09/21/18   Kerin Perna, NP  JULUCA 50-25 MG TABS TAKE 1 TABLET BY MOUTH DAILY WITH SUPPER. 10/27/19   Carlyle Basques, MD  lisinopril (ZESTRIL) 10 MG tablet Take 10 mg by mouth at bedtime.  09/06/18   [provider]  methimazole (TAPAZOLE) 5 MG tablet Take 1.5 tablets (7.5 mg total) by mouth daily. 02/14/20   Shamleffer, Melanie Crazier, MD  montelukast (SINGULAIR) 10 MG tablet Take 10 mg by mouth at bedtime.  11/16/18   [provider]  pravastatin (PRAVACHOL) 40 MG tablet Take 1 tablet (40 mg total) by mouth at bedtime. 09/19/19   Kerin Perna, NP  sildenafil (VIAGRA) 100 MG tablet Take 100 mg by mouth daily as needed for erectile dysfunction.    [provider]  zidovudine (RETROVIR) 300 MG tablet TAKE 1 TABLET (300 MG TOTAL) BY MOUTH DAILY. 10/27/19   Carlyle Basques, MD    Allergies    Patient has no known allergies.  Review of Systems   Review of Systems  Constitutional: Negative for diaphoresis and fever.  HENT: Negative for sore throat.   Eyes: Negative for visual disturbance.  Respiratory: Positive for shortness of breath. Negative for cough, hemoptysis and sputum production.     Cardiovascular: Positive for chest pain.  Gastrointestinal: Negative for abdominal pain and vomiting.  Genitourinary: Negative for hematuria.  Musculoskeletal: Negative for neck pain.  Skin: Negative for rash.  Neurological: Positive for light-headedness.    Physical Exam Updated Vital Signs Pulse 84    Temp 98.7 F (37.1 C)    Resp 16    Ht 5\' 9"  (1.753 m)    Wt 123.4 kg    SpO2 99%    BMI 40.17 kg/m   Physical Exam Vitals and nursing note reviewed.  Constitutional:      Appearance: He is well-developed.  HENT:     Head: Normocephalic and atraumatic.  Eyes:     Conjunctiva/sclera: Conjunctivae normal.  Cardiovascular:     Rate and Rhythm: Normal rate and regular rhythm.     Heart sounds: No murmur heard.   Pulmonary:     Effort: Pulmonary effort is normal. No respiratory distress.     Breath sounds: Normal breath sounds.  Abdominal:     Palpations: Abdomen is soft.     Tenderness: There is no abdominal tenderness.  Musculoskeletal:     Cervical back: Neck supple.     Right lower leg: No tenderness. No edema.     Left lower leg: No tenderness. No edema.     Comments: Fistula right forearm with positive thrill  Skin:    General: Skin is warm and dry.     Capillary Refill: Capillary refill takes less than 2 seconds.  Neurological:     General: No focal deficit present.     Mental Status: He is alert.     GCS: GCS eye subscore is 4. GCS verbal subscore is 5. GCS motor subscore is 6.     ED Results / Procedures / Treatments   Labs (all labs ordered are listed, but only abnormal results are displayed) Labs Reviewed  BASIC METABOLIC PANEL - Abnormal; Notable for the following components:      Result Value   Sodium 132 (*)    Chloride 92 (*)    BUN 27 (*)    Creatinine, Ser 11.62 (*)  GFR calc non Af Amer 4 (*)    GFR calc Af Amer 5 (*)    All other components within normal limits  CBC - Abnormal; Notable for the following components:   RBC 3.55 (*)     Hemoglobin 11.9 (*)    HCT 36.2 (*)    MCV 102.0 (*)    All other components within normal limits  TROPONIN I (HIGH SENSITIVITY) - Abnormal; Notable for the following components:   Troponin I (High Sensitivity) 28 (*)    All other components within normal limits  TROPONIN I (HIGH SENSITIVITY) - Abnormal; Notable for the following components:   Troponin I (High Sensitivity) 27 (*)    All other components within normal limits  SARS CORONAVIRUS 2 BY RT PCR (HOSPITAL ORDER, [facility])  BRAIN NATRIURETIC PEPTIDE    EKG EKG Interpretation  Date/Time:  Sunday March 18 2020 15:36:02 EDT Ventricular Rate:  82 PR Interval:  146 QRS Duration: 94 QT Interval:  396 QTC Calculation: 462 R Axis:   94 Text Interpretation: Normal sinus rhythm Rightward axis Incomplete right bundle branch block T wave abnormality, consider inferior ischemia Prolonged QT Abnormal ECG new twi compared with prior 3/21 Confirmed by Aletta Edouard [id number]) on 03/18/2020 4:37:58 PM   Radiology DG Chest 2 View  Result Date: 03/18/2020 CLINICAL DATA:  Shortness of breath, chest pain EXAM: CHEST - 2 VIEW COMPARISON:  Chest radiograph dated 10/10/2019 FINDINGS: The heart size and mediastinal contours are within normal limits. Mild diffuse interstitial opacities are unchanged. Pleural scarring is suggested in the right lateral lung. No pleural effusion or pneumothorax. The visualized skeletal structures are unremarkable. IMPRESSION: Unchanged mild diffuse interstitial opacities, which may represent interstitial edema or atypical infection. Electronically Signed   By: Zerita Boers M.D.   On: 03/18/2020 16:08    Procedures Procedures (including critical care time)  Medications Ordered in ED Medications - No data to display  ED Course  I have reviewed the triage vital signs and the nursing notes.  Pertinent labs & imaging results that were available during my care of the patient were reviewed  by me and considered in my medical decision making (see chart for details).  Clinical Course as of Mar 18 2228  Sun Mar 18, 2020  1649 Chest x-ray interpreted by me as mild interstitial fluid, no gross infiltrates.   [MB]    Clinical Course User Index [MB] Hayden Rasmussen, MD   MDM Rules/Calculators/A&P                         This patient complains of increased shortness of breath; this involves an extensive number of treatment Options and is a complaint that carries with it a high risk of complications and Morbidity. The differential includes CHF, COPD, ACS, arrhythmia, anemia, fluid overload  I ordered, reviewed and interpreted labs, which included CBC with normal white blood cell count, stable hemoglobin, chemistries fairly unremarkable in the setting of end-stage renal disease, troponin elevated but delta troponin unchanged, BMP unremarkable, Covid testing negative I ordered imaging studies which included chest x-ray and I independently    visualized and interpreted imaging which showed no gross infiltrates, possible increased fluid overload Previous records obtained and reviewed in epic, no recent admissions  After the interventions stated above, I reevaluated the patient and found patient's oxygen saturations are 99 to 100% on room air with no tachypnea.  No evidence of myocardial infarction.  Chest  x-ray not showing any gross infiltrates.  Covid testing negative.  Reviewed with patient and he is comfortable with plan for following up with dialysis tomorrow as scheduled.  Return instructions discussed.   Final Clinical Impression(s) / ED Diagnoses Final diagnoses:  Shortness of breath  ESRD (end stage renal disease) ([place])    Rx / DC Orders ED Discharge Orders    None       Hayden Rasmussen, MD 03/18/20 2233

## 2020-03-18 NOTE — ED Notes (Signed)
Pt. Declined last set of vitals.

## 2020-03-18 NOTE — ED Notes (Signed)
No pain no distress

## 2020-03-31 ENCOUNTER — Other Ambulatory Visit (INDEPENDENT_AMBULATORY_CARE_PROVIDER_SITE_OTHER): Payer: Self-pay | Admitting: Otolaryngology

## 2020-04-02 ENCOUNTER — Other Ambulatory Visit: Payer: Self-pay | Admitting: Internal Medicine

## 2020-04-02 DIAGNOSIS — B20 Human immunodeficiency virus [HIV] disease: Secondary | ICD-10-CM

## 2020-04-05 MED FILL — JULUCA 50-25 MG TAB: 50-25 | 30 days supply | Qty: 30 | Fill #0

## 2020-04-05 MED FILL — ZIDOVUDINE 300 MG TABLET: 300 | 30 days supply | Qty: 30 | Fill #0

## 2020-04-12 ENCOUNTER — Other Ambulatory Visit (INDEPENDENT_AMBULATORY_CARE_PROVIDER_SITE_OTHER): Payer: Self-pay | Admitting: Primary Care

## 2020-04-12 NOTE — Telephone Encounter (Signed)
   Notes to clinic Is this pt associated with your practice? PCP listed is not a provider we assess rx for, Juluis Mire, NP wrote the rx, please assess.

## 2020-04-12 NOTE — Telephone Encounter (Signed)
  Notes to clinic Is this pt associated with your practice, the PCP listed is not in our [place] group, please assess.

## 2020-04-27 ENCOUNTER — Other Ambulatory Visit: Payer: Self-pay | Admitting: Internal Medicine

## 2020-04-27 DIAGNOSIS — B20 Human immunodeficiency virus [HIV] disease: Secondary | ICD-10-CM

## 2020-04-30 MED FILL — JULUCA 50-25 MG TAB: 50-25 | 30 days supply | Qty: 30 | Fill #0

## 2020-04-30 MED FILL — ZIDOVUDINE 300 MG TABLET: 300 | 30 days supply | Qty: 30 | Fill #0

## 2020-05-01 ENCOUNTER — Ambulatory Visit (INDEPENDENT_AMBULATORY_CARE_PROVIDER_SITE_OTHER): Payer: Medicare Other | Admitting: Internal Medicine

## 2020-05-01 ENCOUNTER — Other Ambulatory Visit: Payer: Self-pay

## 2020-05-01 ENCOUNTER — Encounter: Payer: Self-pay | Admitting: Internal Medicine

## 2020-05-01 VITALS — BP 133/78 | HR 80 | Temp 98.1°F | Wt 269.0 lb

## 2020-05-01 DIAGNOSIS — N186 End stage renal disease: Secondary | ICD-10-CM

## 2020-05-01 DIAGNOSIS — Z79899 Other long term (current) drug therapy: Secondary | ICD-10-CM

## 2020-05-01 DIAGNOSIS — I10 Essential (primary) hypertension: Secondary | ICD-10-CM

## 2020-05-01 DIAGNOSIS — B20 Human immunodeficiency virus [HIV] disease: Secondary | ICD-10-CM | POA: Diagnosis not present

## 2020-05-01 DIAGNOSIS — Z992 Dependence on renal dialysis: Secondary | ICD-10-CM

## 2020-05-01 NOTE — Progress Notes (Signed)
RFV: follow up for hiv disease  Patient ID: Darrell Johnson, adult   DOB: 1965/09/24, 54 y.o.   MRN: [id number]  HPI 54yo M with hiv disease, ESRD, HTN, CD 4 count of 637/VL<20 (feb 2021) on juluca-retrovir. He reports that he recently has had to do additional days of HD to deal with overweight.   Just finished hd today. Starting to have to do addn. Sessions  122kg - previously at 109kg many months. Carrying more weight in midsection per patient.  Received 2 doses - of  MRNA doesn't have info with him  He is no longer listed at [facility], still having difficulty with quitting smoking  Outpatient Encounter Medications as of 05/01/2020  Medication Sig  . acetaminophen (TYLENOL) 500 MG tablet Take 500-1,000 mg by mouth every 6 (six) hours as needed for mild pain or headache.  Marland Kitchen amitriptyline (ELAVIL) 25 MG tablet TAKE 1 TABLET BY MOUTH EVERYDAY AT BEDTIME  . amLODipine (NORVASC) 10 MG tablet Take 1 tablet (10 mg total) by mouth daily.  Marland Kitchen aspirin EC 325 MG tablet Take 1 tablet (325 mg total) by mouth daily.  Marland Kitchen azelastine (ASTELIN) 0.1 % nasal spray Place 1 spray into both nostrils every morning. Use in each nostril as directed  . budesonide-formoterol (SYMBICORT) 160-4.5 MCG/ACT inhaler Inhale 2 puffs into the lungs 2 (two) times daily. (Patient taking differently: Inhale 2 puffs into the lungs 2 (two) times daily as needed (shortness of breath/wheezing). )  . carvedilol (COREG) 25 MG tablet Take 1 tablet (25 mg total) by mouth 2 (two) times daily with a meal.  . doxazosin (CARDURA) 8 MG tablet Take 1 tablet (8 mg total) by mouth at bedtime.  Marland Kitchen doxycycline (VIBRA-TABS) 100 MG tablet Take 100 mg by mouth every 12 (twelve) hours.  . ferric citrate (AURYXIA) 1 GM 210 MG(Fe) tablet Take 210-420 mg by mouth See admin instructions. Take 420 mg by mouth three times a day with meals and 210 mg with each snack  . ipratropium-albuterol (DUONEB) 0.5-2.5 (3) MG/3ML SOLN Take 3 mLs by nebulization every 6  (six) hours as needed.  . JULUCA 50-25 MG TABS TAKE 1 TABLET BY MOUTH DAILY WITH SUPPER.  Marland Kitchen lisinopril (ZESTRIL) 20 MG tablet Take 20 mg by mouth at bedtime.  . methimazole (TAPAZOLE) 5 MG tablet Take 1.5 tablets (7.5 mg total) by mouth daily. (Patient taking differently: Take 2.5-5 mg by mouth See admin instructions. Take one tablet (5 mg) by mouth every morning and 1/2 tablet (2.5 mg) at night)  . omeprazole (PRILOSEC) 40 MG capsule Take 40 mg by mouth daily.  . pravastatin (PRAVACHOL) 40 MG tablet TAKE 1 TABLET BY MOUTH EVERYDAY AT BEDTIME  . sildenafil (VIAGRA) 100 MG tablet Take 100 mg by mouth daily as needed for erectile dysfunction.  . triamcinolone (NASACORT) 55 MCG/ACT AERO nasal inhaler Place 1 spray into the nose at bedtime.  . triamcinolone ointment (KENALOG) 0.5 % Apply 1 application topically daily as needed (rash/itching).   . zidovudine (RETROVIR) 300 MG tablet TAKE 1 TABLET (300 MG TOTAL) BY MOUTH DAILY.  Marland Kitchen albuterol (PROVENTIL HFA;VENTOLIN HFA) 108 (90 Base) MCG/ACT inhaler Inhale 2 puffs into the lungs every 4 (four) hours as needed for up to 30 days for wheezing or shortness of breath.   No facility-administered encounter medications on file as of 05/01/2020.     Patient Active Problem List   Diagnosis Date Noted  . Hyperthyroidism 10/13/2019  . Acute on chronic respiratory failure with hypoxemia ([place]) 09/05/2019  .  GERD (gastroesophageal reflux disease) 02/14/2019  . Depression 02/14/2019  . Elevated troponin 02/14/2019  . Acute on chronic diastolic (congestive) heart failure ([place]) 02/14/2019  . Pulmonary edema 01/31/2019  . Moderate mitral regurgitation   . Acute respiratory failure with hypoxia ([place]) 03/29/2017  . Trigger finger of right hand 09/11/2016  . Hyperlipidemia 03/31/2016  . Anxiety state 03/11/2016  . Lumbar radiculopathy 01/28/2016  . Seasonal allergies   . Healthcare maintenance 05/03/2015  . Anemia in chronic kidney disease 08/30/2014  . ESRD on  dialysis ([place]) 12/27/2013  . History of syphilis 11/07/2013  . Arthritis 09/09/2013  . Tobacco abuse 09/07/2013  . Chronic pain disorder 04/28/2013  . Gout 08/13/2009  . HIV disease ([place]) 05/08/2006  . Essential hypertension 05/08/2006  . SEIZURE DISORDER 05/08/2006   Sochx: still smoking  There are no preventive care reminders to display for this patient.   Review of Systems   Constitutional: Negative for fever, chills, diaphoresis, activity change, appetite change, fatigue and unexpected weight change.  HENT: Negative for congestion, sore throat, rhinorrhea, sneezing, trouble swallowing and sinus pressure.  Eyes: Negative for photophobia and visual disturbance.  Respiratory: Negative for cough, chest tightness, shortness of breath, wheezing and stridor.  Cardiovascular: Negative for chest pain, palpitations and leg swelling.  Gastrointestinal: Negative for nausea, vomiting, abdominal pain, diarrhea, constipation, blood in stool, abdominal distention and anal bleeding.  Genitourinary: Negative for dysuria, hematuria, flank pain and difficulty urinating.  Musculoskeletal: Negative for myalgias, back pain, joint swelling, arthralgias and gait problem.  Skin: Negative for color change, pallor, rash and wound.  Neurological: Negative for dizziness, tremors, weakness and light-headedness.  Hematological: Negative for adenopathy. Does not bruise/bleed easily.  Psychiatric/Behavioral: Negative for behavioral problems, confusion, sleep disturbance, dysphoric mood, decreased concentration and agitation.    Physical Exam   BP 133/78   Pulse 80   Temp 98.1 F (36.7 C) (Oral)   Wt 269 lb (122 kg)   BMI 39.72 kg/m   Physical Exam  Constitutional: He is oriented to person, place, and time. He appears well-developed and well-nourished. No distress.  HENT:  Mouth/Throat: Oropharynx is clear and moist. No oropharyngeal exudate.  Cardiovascular: Normal rate, regular rhythm and normal heart  sounds. Exam reveals no gallop and no friction rub.  No murmur heard.  Pulmonary/Chest: Effort normal and breath sounds normal. No respiratory distress. He has no wheezes.  Abdominal: Soft. Bowel sounds are normal. He exhibits no distension. There is no tenderness.  Lymphadenopathy:  He has no cervical adenopathy. CBJ:SEGBT forearm wrapped +thrill  Neurological: He is alert and oriented to person, place, and time.  Skin: Skin is warm and dry. No rash noted. No erythema.  Psychiatric: He has a normal mood and affect. His behavior is normal.    Lab Results  Component Value Date   CD4TCELL 38 08/23/2019   Lab Results  Component Value Date   CD4TABS 637 08/23/2019   CD4TABS 428 01/06/2019   CD4TABS 670 11/03/2017   Lab Results  Component Value Date   HIV1RNAQUANT <20 NOT DETECTED 08/23/2019   Lab Results  Component Value Date   HEPBSAB POS (A) 08/17/2014   Lab Results  Component Value Date   LABRPR NON-REACTIVE 01/06/2019    CBC Lab Results  Component Value Date   WBC 6.5 03/18/2020   RBC 3.55 (L) 03/18/2020   HGB 11.9 (L) 03/18/2020   HCT 36.2 (L) 03/18/2020   PLT 192 03/18/2020   MCV 102.0 (H) 03/18/2020   MCH 33.5 03/18/2020  MCHC 32.9 03/18/2020   RDW 13.7 03/18/2020   LYMPHSABS 1.5 10/10/2019   MONOABS 0.6 10/10/2019   EOSABS 0.2 10/10/2019    BMET Lab Results  Component Value Date   NA 132 (L) 03/18/2020   K 4.3 03/18/2020   CL 92 (L) 03/18/2020   CO2 25 03/18/2020   GLUCOSE 92 03/18/2020   BUN 27 (H) 03/18/2020   CREATININE 11.62 (H) 03/18/2020   CALCIUM 9.0 03/18/2020   GFRNONAA 4 (L) 03/18/2020   GFRAA 5 (L) 03/18/2020      Assessment and Plan  hiv disease = will check hiv vl and cd 4 count. Continue on juluca and retrovir.  ESRD on HD = getting referred to [facility]. No longer listed at [facility], unable to do smoking cessation  Hypertension = well controlled, but has had HD today.  Health maintenance = had flu shot already.  Recommend booster dose of his covid vaccine for him.

## 2020-05-02 LAB — T-HELPER CELL (CD4) - (RCID CLINIC ONLY)
CD4 % Helper T Cell: 44 % (ref 33–65)
CD4 T Cell Abs: 469 /uL (ref 400–1790)

## 2020-05-03 LAB — HIV-1 RNA QUANT-NO REFLEX-BLD
HIV 1 RNA Quant: <20 Copies/mL
HIV-1 RNA Quant, Log: <1.3 Log cps/mL

## 2020-05-23 ENCOUNTER — Other Ambulatory Visit: Payer: Self-pay | Admitting: Internal Medicine

## 2020-05-23 DIAGNOSIS — B20 Human immunodeficiency virus [HIV] disease: Secondary | ICD-10-CM

## 2020-05-24 ENCOUNTER — Other Ambulatory Visit: Payer: Self-pay

## 2020-05-24 ENCOUNTER — Encounter: Payer: Self-pay | Admitting: Internal Medicine

## 2020-05-24 ENCOUNTER — Ambulatory Visit (INDEPENDENT_AMBULATORY_CARE_PROVIDER_SITE_OTHER): Payer: Medicare Other | Admitting: Internal Medicine

## 2020-05-24 VITALS — BP 160/98 | HR 88 | Ht 69.0 in | Wt 268.5 lb

## 2020-05-24 DIAGNOSIS — E0590 Thyrotoxicosis, unspecified without thyrotoxic crisis or storm: Secondary | ICD-10-CM

## 2020-05-24 MED ORDER — METHIMAZOLE 5 MG PO TABS: 7.5000 mg | ORAL_TABLET | Freq: Every day | ORAL | 3 refills | Status: AC

## 2020-05-24 NOTE — Progress Notes (Signed)
Name: Darrell Johnson  MRN/ DOB: [id number], June 26, 1966    Age/ Sex: 55 y.o., adult     PCP: Sonia Side., FNP   Reason for Endocrinology Evaluation: Hyperthyroidism     Initial Endocrinology Clinic Visit: 10/12/2019    PATIENT IDENTIFIER: [name] is a 54 y.o., adult with a past medical history of HTN, CHF, A.Fib,HIV ,and   ESRD on HD. He has followed with [facility] since 10/12/2019 for consultative assistance with management of his hyperthyroidism .   HISTORICAL SUMMARY:  Darrell Johnson was noted to have a suppressed TSH in 08/2019 at 0.001 uIU/mL and elevated Ft4 at 3.48 ng/dL  Was on amiodarone   Methimazole 5 mg BID started by the primary team   No fh of thyroid  Disease   SUBJECTIVE:   Today (05/24/2020):  Darrell Johnson is here for a follow up on hyperthyroidism.   Darrell Johnson has been noted with weight loss  He ran out of the prescription ~ 3 days ago  Denies diarrhea, nausea, vomiting or fever     Denies local neck swelling    Darrell Johnson continues with methimazole 5 mg, 1.5 tabs daily      HISTORY:  Past Medical History:  Past Medical History:  Diagnosis Date  . Anemia   . Anxiety   . Arthritis    "knees" (04/13/2017)  . Asthma   . CHF (congestive heart failure) ([place])   . ESRD (end stage renal disease) on dialysis [facility])    "MWF; [facility]" (04/13/2017)  . Gout, unspecified 08/13/2009   Qualifier: Diagnosis of  By: Redmond Pulling  MD, Mateo Flow    . H1N1 influenza    March 2016  . History of blood transfusion 02/2017   "related to OR"  . HIV infection ([place]) 1980's   on ART therapy since, followed by ID clinic, complicated  by neuropathy  . Hyperlipidemia    hypertrygliceridemia determined ti be secondary to ART therpay  . HYPERTENSION 05/08/2006  . Pneumonia    "once; years ago" (04/13/2017)  . Rib fractures 01/2009  . Seizures ([place])    last seizure was in the 1990s, Darrell Johnson has family history of seizures; "probably related to alcohol" (04/13/2017)  . 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 Syphilis 1997   history of syphilis 1997  . Tobacco abuse    Past Surgical History:  Past Surgical History:  Procedure Laterality Date  . AV FISTULA PLACEMENT Right 12/02/2013   Procedure: ARTERIOVENOUS (AV) FISTULA CREATION;  Surgeon: Rosetta Posner, MD;  Location: [place];  Service: Vascular;  Laterality: Right;  . AV FISTULA PLACEMENT Right 06/09/2019   Procedure: Right Arm Fistula Plication.;  Surgeon: Marty Heck, MD;  Location: [place];  Service: Vascular;  Laterality: Right;  . INCISION AND DRAINAGE ABSCESS Right 03/09/2017   Procedure: INCISION AND DRAINAGE Right Scrotal Abscess;  Surgeon: Franchot Gallo, MD;  Location: [place];  Service: Urology;  Laterality: Right;  . THROMBECTOMY Right ~ 2016   "AV fistula clotted off"    Social History:  reports that he quit smoking about 17 months ago. His smoking use included cigarettes. He has a 2.50 pack-year smoking history. He has never used smokeless tobacco. He reports that he does not drink alcohol and does not use drugs. Family History:  Family History  Problem Relation  Age of Onset  . Cancer Mother   . Hypertension Mother   . COPD Father   . Hypertension Father   . Diabetes Sister   . Hypertension Sister   . Diabetes Brother   . Hypertension Brother   . Stroke Neg Hx      HOME MEDICATIONS: Allergies as of 05/24/2020   No Known Allergies     Medication List       Accurate as of May 24, 2020 10:30 AM. If you have any questions, ask your nurse or doctor.        acetaminophen 500 MG tablet Commonly known as: TYLENOL Take 500-1,000 mg by mouth every 6 (six) hours as needed for mild pain or headache.   albuterol 108 (90 Base) MCG/ACT inhaler Commonly known as: VENTOLIN HFA Inhale 2 puffs into the lungs every 4 (four) hours as needed  for up to 30 days for wheezing or shortness of breath.   amitriptyline 25 MG tablet Commonly known as: ELAVIL TAKE 1 TABLET BY MOUTH EVERYDAY AT BEDTIME   amLODipine 10 MG tablet Commonly known as: NORVASC Take 1 tablet (10 mg total) by mouth daily.   aspirin EC 325 MG tablet Take 1 tablet (325 mg total) by mouth daily.   azelastine 0.1 % nasal spray Commonly known as: ASTELIN USE 1 SPRAY TWICE DAILY   budesonide-formoterol 160-4.5 MCG/ACT inhaler Commonly known as: SYMBICORT Inhale 2 puffs into the lungs 2 (two) times daily. What changed:   when to take this  reasons to take this   carvedilol 25 MG tablet Commonly known as: COREG Take 1 tablet (25 mg total) by mouth 2 (two) times daily with a meal.   doxazosin 8 MG tablet Commonly known as: CARDURA Take 1 tablet (8 mg total) by mouth at bedtime.   doxycycline 100 MG tablet Commonly known as: VIBRA-TABS Take 100 mg by mouth every 12 (twelve) hours.   ferric citrate 1 GM 210 MG(Fe) tablet Commonly known as: AURYXIA Take 210-420 mg by mouth See admin instructions. Take 420 mg by mouth three times a day with meals and 210 mg with each snack   ipratropium-albuterol 0.5-2.5 (3) MG/3ML Soln Commonly known as: DUONEB Take 3 mLs by nebulization every 6 (six) hours as needed.   Juluca 50-25 MG Tabs Generic drug: Dolutegravir-Rilpivirine TAKE 1 TABLET BY MOUTH DAILY WITH SUPPER.   lisinopril 20 MG tablet Commonly known as: ZESTRIL Take 20 mg by mouth at bedtime.   methimazole 5 MG tablet Commonly known as: Tapazole Take 1.5 tablets (7.5 mg total) by mouth daily. What changed:   how much to take  when to take this  additional instructions   omeprazole 40 MG capsule Commonly known as: PRILOSEC Take 40 mg by mouth daily.   pravastatin 40 MG tablet Commonly known as: PRAVACHOL TAKE 1 TABLET BY MOUTH EVERYDAY AT BEDTIME   sildenafil 100 MG tablet Commonly known as: VIAGRA Take 100 mg by mouth daily as  needed for erectile dysfunction.   triamcinolone 55 MCG/ACT Aero nasal inhaler Commonly known as: NASACORT Place 1 spray into the nose at bedtime.   triamcinolone ointment 0.5 % Commonly known as: KENALOG Apply 1 application topically daily as needed (rash/itching).   zidovudine 300 MG tablet Commonly known as: RETROVIR TAKE 1 TABLET (300 MG TOTAL) BY MOUTH DAILY.       OBJECTIVE:   PHYSICAL EXAM: VS: BP (!) 160/98   Pulse 88   Ht 5\' 9"  (1.753 m)   Wt 268  lb 8 oz (121.8 kg)   SpO2 97%   BMI 39.65 kg/m    EXAM: General: Darrell Johnson appears well and is in NAD  Neck: General: Supple without adenopathy. Thyroid: Thyroid size normal.  No goiter or nodules appreciated.  Lungs: Clear with good BS bilat with no rales, rhonchi, or wheezes  Heart: Auscultation: RRR.  Abdomen: Normoactive bowel sounds, soft, nontender, without masses or organomegaly palpable  Extremities:  BL LE: No pretibial edema normal ROM and strength.  Mental Status: Judgment, insight: Intact Memory: Intact for recent and remote events Mood and affect: No depression, anxiety, or agitation     DATA REVIEWED: Results for Darrell Johnson, Darrell Johnson (MRN [id number]) as of 02/14/2020 10:57  Ref. Range 02/13/2020 13:45  TSH Latest Ref Range: 0.35 - 4.50 uIU/mL 2.30  T4,Free(Direct) Latest Ref Range: 0.60 - 1.60 ng/dL 0.83    Results for Darrell Johnson, Darrell Johnson (MRN [id number]) as of 02/13/2020 13:26  Ref. Range 10/12/2019 15:33  TRAB Latest Ref Range: <=2.00 IU/L 2.73 (H)    ASSESSMENT / PLAN / RECOMMENDATIONS:   1. Hyperthyroidism secondary to Graves' Disease  :  - Most likely secondary to Graves' Disease with slight elevation in TRAB level  - No local neck symptoms  - tolerating methimazole well without side effects.  - Awaiting labs results      Medications   Continue  Methimazole 5 mg , 1.5 tabs daily   2.Graves' Disease:   - No evidence of extrathyroidal manifestations of graves' disease   F/U in 3 months       Signed electronically by: Mack Guise, MD  [facility]  [address] [place], [place] [address] Phone: [phone] FAX: [phone]      CC: [address] [place] [address] Phone: [phone]  Fax: [phone]   Return to Endocrinology clinic as below: Future Appointments  Date Time Provider [place]  10/23/2020  1:45 PM Carlyle Basques, MD [facility]

## 2020-05-24 NOTE — Patient Instructions (Signed)
-   Continue Methimazole 5 mg, ONE AND A HALF tablets a day

## 2020-05-25 MED FILL — ZIDOVUDINE 300 MG TABLET: 300 | 30 days supply | Qty: 30 | Fill #0

## 2020-05-25 MED FILL — JULUCA 50-25 MG TAB: 50-25 | 30 days supply | Qty: 30 | Fill #0

## 2020-05-29 ENCOUNTER — Other Ambulatory Visit: Payer: Medicare Other

## 2020-06-22 MED FILL — ZIDOVUDINE 300 MG TABLET: 300 | 30 days supply | Qty: 30 | Fill #1

## 2020-06-22 MED FILL — JULUCA 50-25 MG TAB: 50-25 | 30 days supply | Qty: 30 | Fill #1

## 2020-07-03 ENCOUNTER — Other Ambulatory Visit: Payer: Self-pay

## 2020-07-03 ENCOUNTER — Emergency Department (HOSPITAL_COMMUNITY): Payer: Medicare Other

## 2020-07-03 ENCOUNTER — Emergency Department (HOSPITAL_COMMUNITY)
Admission: EM | Admit: 2020-07-03 | Discharge: 2020-07-03 | Disposition: A | Discharge status: Home / Self Care | Payer: Medicare Other | Attending: Emergency Medicine | Admitting: Emergency Medicine

## 2020-07-03 DIAGNOSIS — W19XXXA Unspecified fall, initial encounter: Secondary | ICD-10-CM

## 2020-07-03 DIAGNOSIS — I110 Hypertensive heart disease with heart failure: Secondary | ICD-10-CM | POA: Diagnosis not present

## 2020-07-03 DIAGNOSIS — J45909 Unspecified asthma, uncomplicated: Secondary | ICD-10-CM | POA: Insufficient documentation

## 2020-07-03 DIAGNOSIS — M545 Low back pain, unspecified: Secondary | ICD-10-CM | POA: Diagnosis not present

## 2020-07-03 DIAGNOSIS — Z992 Dependence on renal dialysis: Secondary | ICD-10-CM | POA: Diagnosis not present

## 2020-07-03 DIAGNOSIS — S34109A Unspecified injury to unspecified level of lumbar spinal cord, initial encounter: Secondary | ICD-10-CM | POA: Insufficient documentation

## 2020-07-03 DIAGNOSIS — I120 Hypertensive chronic kidney disease with stage 5 chronic kidney disease or end stage renal disease: Secondary | ICD-10-CM | POA: Diagnosis not present

## 2020-07-03 DIAGNOSIS — Z95828 Presence of other vascular implants and grafts: Secondary | ICD-10-CM | POA: Insufficient documentation

## 2020-07-03 DIAGNOSIS — Z79899 Other long term (current) drug therapy: Secondary | ICD-10-CM | POA: Diagnosis not present

## 2020-07-03 DIAGNOSIS — Z21 Asymptomatic human immunodeficiency virus [HIV] infection status: Secondary | ICD-10-CM | POA: Diagnosis not present

## 2020-07-03 DIAGNOSIS — S8292XA Unspecified fracture of left lower leg, initial encounter for closed fracture: Secondary | ICD-10-CM | POA: Insufficient documentation

## 2020-07-03 DIAGNOSIS — N186 End stage renal disease: Secondary | ICD-10-CM | POA: Insufficient documentation

## 2020-07-03 DIAGNOSIS — R55 Syncope and collapse: Secondary | ICD-10-CM | POA: Insufficient documentation

## 2020-07-03 DIAGNOSIS — W010XXA Fall on same level from slipping, tripping and stumbling without subsequent striking against object, initial encounter: Secondary | ICD-10-CM | POA: Diagnosis not present

## 2020-07-03 DIAGNOSIS — I509 Heart failure, unspecified: Secondary | ICD-10-CM | POA: Insufficient documentation

## 2020-07-03 DIAGNOSIS — S82892A Other fracture of left lower leg, initial encounter for closed fracture: Secondary | ICD-10-CM

## 2020-07-03 LAB — CBC WITH DIFFERENTIAL/PLATELET
Abs Immature Granulocytes: 0.03 10*3/uL (ref 0.00–0.07)
Basophils Absolute: 0.1 10*3/uL (ref 0.0–0.1)
Basophils Relative: 1 %
Eosinophils Absolute: 0.2 10*3/uL (ref 0.0–0.5)
Eosinophils Relative: 3 %
HCT: 35.4 % — ABNORMAL LOW (ref 39.0–52.0)
Hemoglobin: 11.3 g/dL — ABNORMAL LOW (ref 13.0–17.0)
Immature Granulocytes: 1 %
Lymphocytes Relative: 22 %
Lymphs Abs: 1.4 10*3/uL (ref 0.7–4.0)
MCH: 34 pg (ref 26.0–34.0)
MCHC: 31.9 g/dL (ref 30.0–36.0)
MCV: 106.6 fL — ABNORMAL HIGH (ref 80.0–100.0)
Monocytes Absolute: 0.7 10*3/uL (ref 0.1–1.0)
Monocytes Relative: 11 %
Neutro Abs: 3.9 10*3/uL (ref 1.7–7.7)
Neutrophils Relative %: 62 %
Platelets: 183 10*3/uL (ref 150–400)
RBC: 3.32 MIL/uL — ABNORMAL LOW (ref 4.22–5.81)
RDW: 14.2 % (ref 11.5–15.5)
WBC: 6.2 10*3/uL (ref 4.0–10.5)
nRBC: 0 % (ref 0.0–0.2)

## 2020-07-03 LAB — BASIC METABOLIC PANEL
Anion gap: 22 — ABNORMAL HIGH (ref 5–15)
BUN: 43 mg/dL — ABNORMAL HIGH (ref 6–20)
CO2: 23 mmol/L (ref 22–32)
Calcium: 9 mg/dL (ref 8.9–10.3)
Chloride: 92 mmol/L — ABNORMAL LOW (ref 98–111)
Creatinine, Ser: 11.89 mg/dL — ABNORMAL HIGH (ref 0.61–1.24)
GFR, Estimated: 5 mL/min — ABNORMAL LOW (ref >60)
Glucose, Bld: 88 mg/dL (ref 70–99)
Potassium: 4.1 mmol/L (ref 3.5–5.1)
Sodium: 137 mmol/L (ref 135–145)

## 2020-07-03 LAB — CBG MONITORING, ED: Glucose-Capillary: 78 mg/dL (ref 70–99)

## 2020-07-03 MED ORDER — IBUPROFEN 400 MG PO TABS
600.0000 mg | ORAL_TABLET | Freq: Once | ORAL | Status: AC
  Administered 2020-07-03: 16:00:00 600 mg via ORAL
  Filled 2020-07-03: qty 1

## 2020-07-03 MED ORDER — HYDROCODONE-ACETAMINOPHEN 5-325 MG PO TABS: 1.0000 | ORAL_TABLET | Freq: Four times a day (QID) | ORAL | 0 refills | Status: AC | PRN

## 2020-07-03 MED ORDER — OXYCODONE-ACETAMINOPHEN 5-325 MG PO TABS: 1.0000 | ORAL_TABLET | Freq: Once | ORAL | Status: DC

## 2020-07-03 NOTE — Progress Notes (Signed)
Orthopedic Tech Progress Note Patient Details:  Darrell Johnson 09-14-1965 [id number]  Ortho Devices Type of Ortho Device: Crutches,CAM walker Ortho Device/Splint Location: LLE Ortho Device/Splint Interventions: Ordered,Application,Adjustment   Post Interventions Patient Tolerated: Ambulated well,Well Instructions Provided: Poper ambulation with device,Care of device   Janit Pagan 07/03/2020, 6:16 PM

## 2020-07-03 NOTE — Discharge Instructions (Addendum)
Use the crutches and make sure to keep the brace on.  Avoid putting any weight on your left ankle.  Follow-up with the orthopedic doctor for further evaluation

## 2020-07-03 NOTE — ED Provider Notes (Signed)
[facility] EMERGENCY DEPARTMENT Provider Note   CSN: [id number] Arrival date & time: 07/03/20  1402     History Chief Complaint  Patient presents with   Fall   Near Syncope    Darrell Johnson is a 54 y.o. adult.  HPI   Patient presents to the ED for evaluation of pain after fall.  Patient states he was walking at home.  He was experiencing some pain in his lower back.  Patient states he felt like his left ankle popped and he ended up falling backwards.  Patient did hit his head and there was loss of consciousness.  He denies any headache or neck pain right now.  He denies any chest pain or abdominal pain.  No fevers or chills.  No vomiting or diarrhea.  Patient is on dialysis and went to dialysis yesterday.  Patient is primarily complaining of pain in the left ankle now as well as his lower back.  Past Medical History:  Diagnosis Date   Anemia    Anxiety    Arthritis    "knees" (04/13/2017)   Asthma    CHF (congestive heart failure) (HCC)    ESRD (end stage renal disease) on dialysis ([place])    "MWF; [facility]" (04/13/2017)   Gout, unspecified 08/13/2009   Qualifier: Diagnosis of  By: Redmond Pulling  MD, Valerie     H1N1 influenza    March 2016   History of blood transfusion 02/2017   "related to OR"   HIV infection ([place]) 1980's   on ART therapy since, followed by ID clinic, complicated  by neuropathy   Hyperlipidemia    hypertrygliceridemia determined ti be secondary to ART therpay   HYPERTENSION 05/08/2006   Pneumonia    "once; years ago" (04/13/2017)   Rib fractures 01/2009   Seizures ([place])    last seizure was in the 1990s, pt has family history of seizures; "probably related to alcohol" (04/13/2017)   Sexually transmitted disease    gonorrhea and trichomonas, penile condylomata - s/p circu,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 that was the reason for her at all as if she is a   Syphilis 1997   history of syphilis 1997   Tobacco  abuse     Patient Active Problem List   Diagnosis Date Noted   Hyperthyroidism 10/13/2019   Acute on chronic respiratory failure with hypoxemia ([place]) 09/05/2019   GERD (gastroesophageal reflux disease) 02/14/2019   Depression 02/14/2019   Elevated troponin 02/14/2019   Acute on chronic diastolic (congestive) heart failure ([place]) 02/14/2019   Pulmonary edema 01/31/2019   Moderate mitral regurgitation    Acute respiratory failure with hypoxia ([place]) 03/29/2017   Trigger finger of right hand 09/11/2016   Hyperlipidemia 03/31/2016   Anxiety state 03/11/2016   Lumbar radiculopathy 01/28/2016   Seasonal allergies    Healthcare maintenance 05/03/2015   Anemia in chronic kidney disease 08/30/2014   ESRD on dialysis ([place]) 12/27/2013   History of syphilis 11/07/2013   Arthritis 09/09/2013   Tobacco abuse 09/07/2013   Chronic pain disorder 04/28/2013   Gout 08/13/2009   HIV disease ([place]) 05/08/2006   Essential hypertension 05/08/2006   SEIZURE DISORDER 05/08/2006    Past Surgical History:  Procedure Laterality Date   AV FISTULA PLACEMENT Right 12/02/2013   Procedure: ARTERIOVENOUS (AV) FISTULA CREATION;  Surgeon: Rosetta Posner, MD;  Location: [facility] OR;  Service: Vascular;  Laterality: Right;   AV FISTULA PLACEMENT Right 06/09/2019   Procedure: Right Arm Fistula Plication.;  Surgeon:  Marty Heck, MD;  Location: [facility];  Service: Vascular;  Laterality: Right;   INCISION AND DRAINAGE ABSCESS Right 03/09/2017   Procedure: INCISION AND DRAINAGE Right Scrotal Abscess;  Surgeon: Franchot Gallo, MD;  Location: [place];  Service: Urology;  Laterality: Right;   THROMBECTOMY Right ~ 2016   "AV fistula clotted off"       Family History  Problem Relation Age of Onset   Cancer Mother    Hypertension Mother    COPD Father    Hypertension Father    Diabetes Sister    Hypertension Sister    Diabetes Brother    Hypertension Brother    Stroke Neg Hx      Social History   Tobacco Use   Smoking status: Former Smoker    Packs/day: 0.10    Years: 25.00    Pack years: 2.50    Types: Cigarettes    Quit date: 12/20/2018    Years since quitting: 1.5   Smokeless tobacco: Never Used   Tobacco comment: "quitting"  Vaping Use   Vaping Use: Some days  Substance Use Topics   Alcohol use: No    Alcohol/week: 0.0 standard drinks    Comment: 04/13/2017 "quit ~ 2014"   Drug use: No    Home Medications Prior to Admission medications   Medication Sig Start Date End Date Taking? Authorizing Provider  acetaminophen (TYLENOL) 500 MG tablet Take 500-1,000 mg by mouth every 6 (six) hours as needed for mild pain or headache.    [provider]  albuterol (PROVENTIL HFA;VENTOLIN HFA) 108 (90 Base) MCG/ACT inhaler Inhale 2 puffs into the lungs every 4 (four) hours as needed for up to 30 days for wheezing or shortness of breath. 09/21/18 05/24/20  Kerin Perna, NP  amitriptyline (ELAVIL) 25 MG tablet TAKE 1 TABLET BY MOUTH EVERYDAY AT BEDTIME 12/20/19   Carlyle Basques, MD  amLODipine (NORVASC) 10 MG tablet Take 1 tablet (10 mg total) by mouth daily. 09/21/18   Kerin Perna, NP  aspirin EC 325 MG tablet Take 1 tablet (325 mg total) by mouth daily. 09/07/19 09/06/20  Domenic Polite, MD  azelastine (ASTELIN) 0.1 % nasal spray USE 1 SPRAY TWICE DAILY 05/15/20   Rozetta Nunnery, MD  budesonide-formoterol [facility]) 160-4.5 MCG/ACT inhaler Inhale 2 puffs into the lungs 2 (two) times daily. Patient taking differently: Inhale 2 puffs into the lungs 2 (two) times daily as needed (shortness of breath/wheezing).  09/21/18   Kerin Perna, NP  carvedilol (COREG) 25 MG tablet Take 1 tablet (25 mg total) by mouth 2 (two) times daily with a meal. 09/21/18   Kerin Perna, NP  doxazosin (CARDURA) 8 MG tablet Take 1 tablet (8 mg total) by mouth at bedtime. 09/21/18   Kerin Perna, NP  doxycycline (VIBRA-TABS) 100 MG tablet Take 100  mg by mouth every 12 (twelve) hours. 03/13/20   [provider]  ferric citrate (AURYXIA) 1 GM 210 MG(Fe) tablet Take 210-420 mg by mouth See admin instructions. Take 420 mg by mouth three times a day with meals and 210 mg with each snack    [provider]  HYDROcodone-acetaminophen (NORCO/VICODIN) 5-325 MG tablet Take 1 tablet by mouth every 6 (six) hours as needed. 07/03/20   Dorie Rank, MD  ipratropium-albuterol (DUONEB) 0.5-2.5 (3) MG/3ML SOLN Take 3 mLs by nebulization every 6 (six) hours as needed. 09/21/18   Kerin Perna, NP  JULUCA 50-25 MG TABS TAKE 1 TABLET BY MOUTH  DAILY WITH SUPPER. 05/23/20   Carlyle Basques, MD  lisinopril (ZESTRIL) 20 MG tablet Take 20 mg by mouth at bedtime. 12/23/19   [provider]  methimazole (TAPAZOLE) 5 MG tablet Take 1.5 tablets (7.5 mg total) by mouth daily. 05/24/20   Shamleffer, Melanie Crazier, MD  omeprazole (PRILOSEC) 40 MG capsule Take 40 mg by mouth daily. 02/26/20   [provider]  pravastatin (PRAVACHOL) 40 MG tablet TAKE 1 TABLET BY MOUTH EVERYDAY AT BEDTIME 04/12/20   Kerin Perna, NP  sildenafil (VIAGRA) 100 MG tablet Take 100 mg by mouth daily as needed for erectile dysfunction.    [provider]  triamcinolone (NASACORT) 55 MCG/ACT AERO nasal inhaler Place 1 spray into the nose at bedtime.    [provider]  triamcinolone ointment (KENALOG) 0.5 % Apply 1 application topically daily as needed (rash/itching).  03/13/20   [provider]  zidovudine (RETROVIR) 300 MG tablet TAKE 1 TABLET (300 MG TOTAL) BY MOUTH DAILY. 05/23/20   Carlyle Basques, MD    Allergies    Patient has no known allergies.  Review of Systems   Review of Systems  All other systems reviewed and are negative.   Physical Exam Updated Vital Signs BP 140/65 (BP Location: Left Wrist)    Pulse 84    Temp 99.3 F (37.4 C) (Oral)    Resp 14    SpO2 96%   Physical Exam Vitals and nursing note reviewed.   Constitutional:      General: He is not in acute distress.    Appearance: He is well-developed.     Comments: Appears fatigued but is alert and awake and answer my questions when I am speaking with him  HENT:     Head: Normocephalic and atraumatic.     Right Ear: External ear normal.     Left Ear: External ear normal.  Eyes:     General: No scleral icterus.       Right eye: No discharge.        Left eye: No discharge.     Conjunctiva/sclera: Conjunctivae normal.  Neck:     Trachea: No tracheal deviation.  Cardiovascular:     Rate and Rhythm: Normal rate and regular rhythm.  Pulmonary:     Effort: Pulmonary effort is normal. No respiratory distress.     Breath sounds: Normal breath sounds. No stridor.  Abdominal:     General: There is no distension.     Palpations: There is no mass.     Tenderness: There is no abdominal tenderness.  Musculoskeletal:        General: No swelling or deformity.     Cervical back: Neck supple.     Comments: Dialysis graft right forearm; ttp lumbar spine, ?mucle spasm paraspinal soft tissue vs calciphylaxis, ttp left ankle, no deformity, distal nv intact  Skin:    General: Skin is warm and dry.     Findings: No rash.  Neurological:     General: No focal deficit present.     Mental Status: He is alert and oriented to person, place, and time.     Cranial Nerves: Cranial nerve deficit: no gross deficits . nl speech, no facial drrop.     Motor: No weakness.     Coordination: Coordination normal.     ED Results / Procedures / Treatments   Labs (all labs ordered are listed, but only abnormal results are displayed) Labs Reviewed  BASIC METABOLIC PANEL - Abnormal; Notable for  the following components:      Result Value   Chloride 92 (*)    BUN 43 (*)    Creatinine, Ser 11.89 (*)    GFR, Estimated 5 (*)    Anion gap 22 (*)    All other components within normal limits  CBC WITH DIFFERENTIAL/PLATELET - Abnormal; Notable for the following  components:   RBC 3.32 (*)    Hemoglobin 11.3 (*)    HCT 35.4 (*)    MCV 106.6 (*)    All other components within normal limits  CBG MONITORING, ED    EKG EKG Interpretation  Date/Time:  Tuesday July 03 2020 14:01:44 EST Ventricular Rate:  82 PR Interval:  148 QRS Duration: 94 QT Interval:  404 QTC Calculation: 472 R Axis:   83 Text Interpretation: Normal sinus rhythm Incomplete right bundle branch block Borderline ECG No significant change was found Confirmed by Ezequiel Essex [phone]) on 07/03/2020 2:25:54 PM   Radiology DG Lumbar Spine Complete  Result Date: 07/03/2020 CLINICAL DATA:  54 year old male with fall and back pain. EXAM: LUMBAR SPINE - COMPLETE 4+ VIEW COMPARISON:  CT abdomen pelvis dated 10/13/2017. FINDINGS: Tiny bone fragment from the right lateral corner of the inferior endplate of L3, age indeterminate, likely chronic. Clinical correlation is recommended. No other acute fracture or subluxation. Mild multilevel chronic compression deformities. Multilevel degenerative changes with disc space narrowing primarily at L3-L4. There are multilevel lower lumbar facet arthropathy. The visualized posterior elements appear intact. There is advanced atherosclerotic calcification of the abdominal aorta. The soft tissues are unremarkable. IMPRESSION: Age indeterminate, likely chronic bone fragment from the right lateral inferior endplate of L3. Clinical correlation is recommended. No other acute fracture or subluxation. Electronically Signed   By: Anner Crete M.D.   On: 07/03/2020 16:08   DG Ankle Complete Left  Result Date: 07/03/2020 CLINICAL DATA:  Pain, fall EXAM: LEFT ANKLE COMPLETE - 3+ VIEW COMPARISON:  None. FINDINGS: Vascular calcifications. Acute fracture involving the distal fibula with extension of lucency to the ankle joint, less than 1/4 shaft diameter lateral displacement distal fracture fragment. Acute avulsion fracture fragments likely from the tip of the  medial malleolus. Ankle mortise is symmetric. Positive for soft tissue swelling. IMPRESSION: 1. Acute mildly displaced distal fibular fracture. 2. Acute displaced avulsion fractures at the tip of the medial malleolus. Electronically Signed   By: Donavan Foil M.D.   On: 07/03/2020 16:06    Procedures Procedures (including critical care time)  Medications Ordered in ED Medications  ibuprofen (ADVIL) tablet 600 mg (600 mg Oral Given 07/03/20 1559)    ED Course  I have reviewed the triage vital signs and the nursing notes.  Pertinent labs & imaging results that were available during my care of the patient were reviewed by me and considered in my medical decision making (see chart for details).  Clinical Course as of 07/03/20 1731  Tue Jul 03, 2020  1653 Patient is refusing head CT and C-spine CT.  Pt denies head ache or neck pain.  Understands my concern about him passing out possibly after his fall. [JK]    Clinical Course User Index [JK] Dorie Rank, MD   MDM Rules/Calculators/A&P                          Patient presented to the ED for evaluation after fall at home.  Patient is a dialysis patient and does go on Monday Wednesday Friday.  He did  not skip his treatment yesterday.  In the ED the patient was initially noted to be somewhat somnolent but that has resolved and is alert and awake.  Did initially order head CT and C-spine CT because of the concern of potential head injury.  Patient ended up refusing this test.  He did not feel it was necessary.  Patient denies any headache or neck pain currently.  X-rays did show evidence of a fracture of his left ankle.  There is also question of a possible chip fracture in his lumbar spine.  Patient was placed on crutches and a Cam walker.  Instructed to remain nonweightbearing.  Will refer to orthopedics for outpatient evaluation Final Clinical Impression(s) / ED Diagnoses Final diagnoses:  Fall, initial encounter  Closed fracture of left  ankle, initial encounter    Rx / DC Orders ED Discharge Orders         Ordered    HYDROcodone-acetaminophen (NORCO/VICODIN) 5-325 MG tablet  Every 6 hours PRN        07/03/20 1729           Dorie Rank, MD 07/03/20 1733

## 2020-07-03 NOTE — ED Triage Notes (Signed)
Pt to ED via EMS from home c/o fall that occurred aprox 1.5 hour ago. Unwitnessed fall, per pt there was LOC. Medical HX HTN, HIV, receives dialysis MWF, Full treatment yesterday. dialysis graft to right forearm, No mediations given by EMS. Not on blood thinners. . Last VS: 170/80, hr 88, RR 20, 98%ra, cbg 94.

## 2020-07-03 NOTE — ED Provider Notes (Signed)
Patient not on stretcher on attempted evaluation.    Darrell Essex, MD 07/03/20 1447

## 2020-07-04 ENCOUNTER — Emergency Department (HOSPITAL_COMMUNITY): Payer: Medicare Other

## 2020-07-04 ENCOUNTER — Observation Stay (HOSPITAL_COMMUNITY)
Admission: EM | Admit: 2020-07-04 | Discharge: 2020-07-05 | Disposition: A | Discharge status: Home / Self Care | Payer: Medicare Other | Attending: Internal Medicine | Admitting: Internal Medicine

## 2020-07-04 DIAGNOSIS — Z7982 Long term (current) use of aspirin: Secondary | ICD-10-CM | POA: Insufficient documentation

## 2020-07-04 DIAGNOSIS — N186 End stage renal disease: Secondary | ICD-10-CM

## 2020-07-04 DIAGNOSIS — I5033 Acute on chronic diastolic (congestive) heart failure: Secondary | ICD-10-CM | POA: Insufficient documentation

## 2020-07-04 DIAGNOSIS — Z992 Dependence on renal dialysis: Secondary | ICD-10-CM | POA: Diagnosis not present

## 2020-07-04 DIAGNOSIS — M79672 Pain in left foot: Secondary | ICD-10-CM

## 2020-07-04 DIAGNOSIS — Z79899 Other long term (current) drug therapy: Secondary | ICD-10-CM | POA: Diagnosis not present

## 2020-07-04 DIAGNOSIS — I132 Hypertensive heart and chronic kidney disease with heart failure and with stage 5 chronic kidney disease, or end stage renal disease: Secondary | ICD-10-CM | POA: Insufficient documentation

## 2020-07-04 DIAGNOSIS — Z20822 Contact with and (suspected) exposure to covid-19: Secondary | ICD-10-CM | POA: Diagnosis not present

## 2020-07-04 DIAGNOSIS — J45909 Unspecified asthma, uncomplicated: Secondary | ICD-10-CM | POA: Insufficient documentation

## 2020-07-04 DIAGNOSIS — R55 Syncope and collapse: Principal | ICD-10-CM

## 2020-07-04 DIAGNOSIS — Z87891 Personal history of nicotine dependence: Secondary | ICD-10-CM | POA: Diagnosis not present

## 2020-07-04 DIAGNOSIS — R4182 Altered mental status, unspecified: Secondary | ICD-10-CM | POA: Diagnosis present

## 2020-07-04 LAB — CBC WITH DIFFERENTIAL/PLATELET
Abs Immature Granulocytes: 0.02 10*3/uL (ref 0.00–0.07)
Basophils Absolute: 0 10*3/uL (ref 0.0–0.1)
Basophils Relative: 1 %
Eosinophils Absolute: 0.2 10*3/uL (ref 0.0–0.5)
Eosinophils Relative: 2 %
HCT: 34.4 % — ABNORMAL LOW (ref 39.0–52.0)
Hemoglobin: 11.6 g/dL — ABNORMAL LOW (ref 13.0–17.0)
Immature Granulocytes: 0 %
Lymphocytes Relative: 13 %
Lymphs Abs: 0.8 10*3/uL (ref 0.7–4.0)
MCH: 34.6 pg — ABNORMAL HIGH (ref 26.0–34.0)
MCHC: 33.7 g/dL (ref 30.0–36.0)
MCV: 102.7 fL — ABNORMAL HIGH (ref 80.0–100.0)
Monocytes Absolute: 0.9 10*3/uL (ref 0.1–1.0)
Monocytes Relative: 14 %
Neutro Abs: 4.4 10*3/uL (ref 1.7–7.7)
Neutrophils Relative %: 70 %
Platelets: 179 10*3/uL (ref 150–400)
RBC: 3.35 MIL/uL — ABNORMAL LOW (ref 4.22–5.81)
RDW: 14.4 % (ref 11.5–15.5)
WBC: 6.3 10*3/uL (ref 4.0–10.5)
nRBC: 0 % (ref 0.0–0.2)

## 2020-07-04 LAB — COMPREHENSIVE METABOLIC PANEL
ALT: 17 U/L (ref 0–44)
ALT: 16 U/L (ref 0–44)
AST: 27 U/L (ref 15–41)
AST: 28 U/L (ref 15–41)
Albumin: 3.9 g/dL (ref 3.5–5.0)
Albumin: 3.8 g/dL (ref 3.5–5.0)
Alkaline Phosphatase: 106 U/L (ref 38–126)
Alkaline Phosphatase: 105 U/L (ref 38–126)
Anion gap: 16 — ABNORMAL HIGH (ref 5–15)
Anion gap: 15 (ref 5–15)
BUN: 29 mg/dL — ABNORMAL HIGH (ref 6–20)
BUN: 39 mg/dL — ABNORMAL HIGH (ref 6–20)
CO2: 27 mmol/L (ref 22–32)
CO2: 29 mmol/L (ref 22–32)
Calcium: 8.7 mg/dL — ABNORMAL LOW (ref 8.9–10.3)
Calcium: 8.9 mg/dL (ref 8.9–10.3)
Chloride: 93 mmol/L — ABNORMAL LOW (ref 98–111)
Chloride: 91 mmol/L — ABNORMAL LOW (ref 98–111)
Creatinine, Ser: 9.1 mg/dL — ABNORMAL HIGH (ref 0.61–1.24)
Creatinine, Ser: 11.55 mg/dL — ABNORMAL HIGH (ref 0.61–1.24)
GFR, Estimated: 6 mL/min — ABNORMAL LOW (ref >60)
GFR, Estimated: 5 mL/min — ABNORMAL LOW (ref >60)
Glucose, Bld: 94 mg/dL (ref 70–99)
Glucose, Bld: 107 mg/dL — ABNORMAL HIGH (ref 70–99)
Potassium: 3.6 mmol/L (ref 3.5–5.1)
Potassium: 3.9 mmol/L (ref 3.5–5.1)
Sodium: 136 mmol/L (ref 135–145)
Sodium: 135 mmol/L (ref 135–145)
Total Bilirubin: 0.8 mg/dL (ref 0.3–1.2)
Total Bilirubin: 0.7 mg/dL (ref 0.3–1.2)
Total Protein: 7.5 g/dL (ref 6.5–8.1)
Total Protein: 7.6 g/dL (ref 6.5–8.1)

## 2020-07-04 LAB — CBC
HCT: 34.6 % — ABNORMAL LOW (ref 39.0–52.0)
Hemoglobin: 11 g/dL — ABNORMAL LOW (ref 13.0–17.0)
MCH: 33.2 pg (ref 26.0–34.0)
MCHC: 31.8 g/dL (ref 30.0–36.0)
MCV: 104.5 fL — ABNORMAL HIGH (ref 80.0–100.0)
Platelets: 186 10*3/uL (ref 150–400)
RBC: 3.31 MIL/uL — ABNORMAL LOW (ref 4.22–5.81)
RDW: 14.5 % (ref 11.5–15.5)
WBC: 5.9 10*3/uL (ref 4.0–10.5)
nRBC: 0 % (ref 0.0–0.2)

## 2020-07-04 LAB — PHOSPHORUS: Phosphorus: 7.4 mg/dL — ABNORMAL HIGH (ref 2.5–4.6)

## 2020-07-04 LAB — TROPONIN I (HIGH SENSITIVITY)
Troponin I (High Sensitivity): 29 ng/L — ABNORMAL HIGH (ref <18)
Troponin I (High Sensitivity): 27 ng/L — ABNORMAL HIGH (ref <18)

## 2020-07-04 LAB — RESP PANEL BY RT-PCR (FLU A&B, COVID) ARPGX2
Influenza A by PCR: NEGATIVE
Influenza B by PCR: NEGATIVE
SARS Coronavirus 2 by RT PCR: NEGATIVE

## 2020-07-04 LAB — MAGNESIUM: Magnesium: 2.2 mg/dL (ref 1.7–2.4)

## 2020-07-04 LAB — ETHANOL: Alcohol, Ethyl (B): <10 mg/dL (ref <10)

## 2020-07-04 LAB — TSH: TSH: 1.176 u[IU]/mL (ref 0.350–4.500)

## 2020-07-04 MED ORDER — ACETAMINOPHEN 325 MG PO TABS
650.0000 mg | ORAL_TABLET | Freq: Four times a day (QID) | ORAL | Status: DC | PRN
  Administered 2020-07-05 (×2): 650 mg via ORAL
  Filled 2020-07-04 (×2): qty 2

## 2020-07-04 MED ORDER — RILPIVIRINE HCL 25 MG PO TABS
25.0000 mg | ORAL_TABLET | Freq: Every day | ORAL | Status: DC
  Administered 2020-07-04: 20:00:00 25 mg via ORAL
  Filled 2020-07-04 (×2): qty 1

## 2020-07-04 MED ORDER — ALBUTEROL SULFATE HFA 108 (90 BASE) MCG/ACT IN AERS
2.0000 | INHALATION_SPRAY | RESPIRATORY_TRACT | Status: DC | PRN
  Filled 2020-07-04: qty 6.7

## 2020-07-04 MED ORDER — DOLUTEGRAVIR SODIUM 50 MG PO TABS
50.0000 mg | ORAL_TABLET | Freq: Every day | ORAL | Status: DC
  Filled 2020-07-04: qty 1

## 2020-07-04 MED ORDER — AMLODIPINE BESYLATE 5 MG PO TABS
2.5000 mg | ORAL_TABLET | Freq: Every day | ORAL | Status: DC
  Administered 2020-07-04 – 2020-07-05 (×2): 2.5 mg via ORAL
  Filled 2020-07-04 (×3): qty 1

## 2020-07-04 MED ORDER — NICOTINE 7 MG/24HR TD PT24
7.0000 mg | MEDICATED_PATCH | Freq: Every day | TRANSDERMAL | Status: DC | PRN
  Filled 2020-07-04: qty 1

## 2020-07-04 MED ORDER — HEPARIN SODIUM (PORCINE) 5000 UNIT/ML IJ SOLN
5000.0000 [IU] | Freq: Three times a day (TID) | INTRAMUSCULAR | Status: DC
  Administered 2020-07-04 – 2020-07-05 (×3): 5000 [IU] via SUBCUTANEOUS
  Filled 2020-07-04 (×3): qty 1

## 2020-07-04 MED ORDER — DOLUTEGRAVIR SODIUM 50 MG PO TABS
50.0000 mg | ORAL_TABLET | Freq: Every day | ORAL | Status: DC
  Administered 2020-07-04: 20:00:00 50 mg via ORAL
  Filled 2020-07-04 (×2): qty 1

## 2020-07-04 MED ORDER — ACETAMINOPHEN 650 MG RE SUPP: 650.0000 mg | Freq: Four times a day (QID) | RECTAL | Status: DC | PRN

## 2020-07-04 MED ORDER — ZIDOVUDINE 100 MG PO CAPS
300.0000 mg | ORAL_CAPSULE | Freq: Every day | ORAL | Status: DC
  Administered 2020-07-04 – 2020-07-05 (×2): 300 mg via ORAL
  Filled 2020-07-04 (×3): qty 3

## 2020-07-04 MED ORDER — METHIMAZOLE 5 MG PO TABS
7.5000 mg | ORAL_TABLET | Freq: Every day | ORAL | Status: DC
  Administered 2020-07-04 – 2020-07-05 (×2): 7.5 mg via ORAL
  Filled 2020-07-04 (×2): qty 2

## 2020-07-04 NOTE — ED Provider Notes (Signed)
[facility] EMERGENCY DEPARTMENT Provider Note   CSN: [id number] Arrival date & time: 07/04/20  2725     History No chief complaint on file.   Darrell Johnson is a 54 y.o. adult. Level 5 caveat due to altered mental status. HPI Patient brought in reportedly for shortness of breath or altered mental status.  Was at dialysis.  Reportedly unable to complete all of it.  Patient states he was just sleepy because he did not sleep well last night.  Seen in the ER yesterday due to fall and a syncope.  Patient states he did not get a head CT yesterday because did not feel that he needed it.  States he did take a muscle relaxer last night.  No chest pain.  Denies feeling short of breath.  Does have some sedation and will fall asleep in between questions.    Past Medical History:  Diagnosis Date  . Anemia   . Anxiety   . Arthritis    "knees" (04/13/2017)  . Asthma   . CHF (congestive heart failure) ([place])   . ESRD (end stage renal disease) on dialysis [facility])    "MWF; [facility]" (04/13/2017)  . Gout, unspecified 08/13/2009   Qualifier: Diagnosis of  By: Redmond Pulling  MD, Mateo Flow    . H1N1 influenza    March 2016  . History of blood transfusion 02/2017   "related to OR"  . HIV infection ([place]) 1980's   on ART therapy since, followed by ID clinic, complicated  by neuropathy  . Hyperlipidemia    hypertrygliceridemia determined ti be secondary to ART therpay  . HYPERTENSION 05/08/2006  . Pneumonia    "once; years ago" (04/13/2017)  . Rib fractures 01/2009  . Seizures ([place])    last seizure was in the 1990s, pt has family history of seizures; "probably related to alcohol" (04/13/2017)  . 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 Syphilis 1997   history of syphilis 1997  . Tobacco abuse     Patient Active Problem List   Diagnosis Date  Noted  . Hyperthyroidism 10/13/2019  . Acute on chronic respiratory failure with hypoxemia ([place]) 09/05/2019  . GERD (gastroesophageal reflux disease) 02/14/2019  . Depression 02/14/2019  . Elevated troponin 02/14/2019  . Acute on chronic diastolic (congestive) heart failure ([place]) 02/14/2019  . Pulmonary edema 01/31/2019  . Moderate mitral regurgitation   . Acute respiratory failure with hypoxia ([place]) 03/29/2017  . Trigger finger of right hand 09/11/2016  . Hyperlipidemia 03/31/2016  . Anxiety state 03/11/2016  . Lumbar radiculopathy 01/28/2016  . Seasonal allergies   . Healthcare maintenance 05/03/2015  . Anemia in chronic kidney disease 08/30/2014  . ESRD on dialysis ([place]) 12/27/2013  . History of syphilis 11/07/2013  . Arthritis 09/09/2013  . Tobacco abuse 09/07/2013  . Chronic pain disorder 04/28/2013  . Gout 08/13/2009  . HIV disease ([place]) 05/08/2006  . Essential hypertension 05/08/2006  . SEIZURE DISORDER 05/08/2006    Past Surgical History:  Procedure Laterality Date  . AV FISTULA PLACEMENT Right 12/02/2013   Procedure: ARTERIOVENOUS (AV) FISTULA CREATION;  Surgeon: Rosetta Posner, MD;  Location: [place];  Service: Vascular;  Laterality: Right;  . AV FISTULA PLACEMENT Right 06/09/2019   Procedure: Right Arm Fistula Plication.;  Surgeon: Marty Heck, MD;  Location: [facility];  Service: Vascular;  Laterality: Right;  .  INCISION AND DRAINAGE ABSCESS Right 03/09/2017   Procedure: INCISION AND DRAINAGE Right Scrotal Abscess;  Surgeon: Franchot Gallo, MD;  Location: [facility];  Service: Urology;  Laterality: Right;  . THROMBECTOMY Right ~ 2016   "AV fistula clotted off"       Family History  Problem Relation Age of Onset  . Cancer Mother   . Hypertension Mother   . COPD Father   . Hypertension Father   . Diabetes Sister   . Hypertension Sister   . Diabetes Brother   . Hypertension Brother   . Stroke Neg Hx     Social History   Tobacco Use  . Smoking status:  Former Smoker    Packs/day: 0.10    Years: 25.00    Pack years: 2.50    Types: Cigarettes    Quit date: 12/20/2018    Years since quitting: 1.5  . Smokeless tobacco: Never Used  . Tobacco comment: "quitting"  Vaping Use  . Vaping Use: Some days  Substance Use Topics  . Alcohol use: No    Alcohol/week: 0.0 standard drinks    Comment: 04/13/2017 "quit ~ 2014"  . Drug use: No    Home Medications Prior to Admission medications   Medication Sig Start Date End Date Taking? Authorizing Provider  acetaminophen (TYLENOL) 500 MG tablet Take 500-1,000 mg by mouth every 6 (six) hours as needed for mild pain or headache.    [provider]  albuterol (PROVENTIL HFA;VENTOLIN HFA) 108 (90 Base) MCG/ACT inhaler Inhale 2 puffs into the lungs every 4 (four) hours as needed for up to 30 days for wheezing or shortness of breath. 09/21/18 05/24/20  Kerin Perna, NP  amitriptyline (ELAVIL) 25 MG tablet TAKE 1 TABLET BY MOUTH EVERYDAY AT BEDTIME 12/20/19   Carlyle Basques, MD  amLODipine (NORVASC) 10 MG tablet Take 1 tablet (10 mg total) by mouth daily. 09/21/18   Kerin Perna, NP  aspirin EC 325 MG tablet Take 1 tablet (325 mg total) by mouth daily. 09/07/19 09/06/20  Domenic Polite, MD  azelastine (ASTELIN) 0.1 % nasal spray USE 1 SPRAY TWICE DAILY 05/15/20   Rozetta Nunnery, MD  budesonide-formoterol [facility]) 160-4.5 MCG/ACT inhaler Inhale 2 puffs into the lungs 2 (two) times daily. Patient taking differently: Inhale 2 puffs into the lungs 2 (two) times daily as needed (shortness of breath/wheezing).  09/21/18   Kerin Perna, NP  carvedilol (COREG) 25 MG tablet Take 1 tablet (25 mg total) by mouth 2 (two) times daily with a meal. 09/21/18   Kerin Perna, NP  doxazosin (CARDURA) 8 MG tablet Take 1 tablet (8 mg total) by mouth at bedtime. 09/21/18   Kerin Perna, NP  doxycycline (VIBRA-TABS) 100 MG tablet Take 100 mg by mouth every 12 (twelve) hours. 03/13/20   [provider]  ferric citrate (AURYXIA) 1 GM 210 MG(Fe) tablet Take 210-420 mg by mouth See admin instructions. Take 420 mg by mouth three times a day with meals and 210 mg with each snack    [provider]  HYDROcodone-acetaminophen (NORCO/VICODIN) 5-325 MG tablet Take 1 tablet by mouth every 6 (six) hours as needed. 07/03/20   Dorie Rank, MD  ipratropium-albuterol (DUONEB) 0.5-2.5 (3) MG/3ML SOLN Take 3 mLs by nebulization every 6 (six) hours as needed. 09/21/18   Kerin Perna, NP  JULUCA 50-25 MG TABS TAKE 1 TABLET BY MOUTH DAILY WITH SUPPER. 05/23/20   Carlyle Basques, MD  lisinopril (ZESTRIL) 20 MG tablet Take  20 mg by mouth at bedtime. 12/23/19   [provider]  methimazole (TAPAZOLE) 5 MG tablet Take 1.5 tablets (7.5 mg total) by mouth daily. 05/24/20   Shamleffer, Melanie Crazier, MD  omeprazole (PRILOSEC) 40 MG capsule Take 40 mg by mouth daily. 02/26/20   [provider]  pravastatin (PRAVACHOL) 40 MG tablet TAKE 1 TABLET BY MOUTH EVERYDAY AT BEDTIME 04/12/20   Kerin Perna, NP  sildenafil (VIAGRA) 100 MG tablet Take 100 mg by mouth daily as needed for erectile dysfunction.    [provider]  triamcinolone (NASACORT) 55 MCG/ACT AERO nasal inhaler Place 1 spray into the nose at bedtime.    [provider]  triamcinolone ointment (KENALOG) 0.5 % Apply 1 application topically daily as needed (rash/itching).  03/13/20   [provider]  zidovudine (RETROVIR) 300 MG tablet TAKE 1 TABLET (300 MG TOTAL) BY MOUTH DAILY. 05/23/20   Carlyle Basques, MD    Allergies    Patient has no known allergies.  Review of Systems   Review of Systems  Unable to perform ROS: Mental status change  Constitutional: Negative for appetite change.  Neurological: Positive for syncope.    Physical Exam Updated Vital Signs BP (!) 158/81   Pulse 85   Temp 98.7 F (37.1 C) (Oral)   Resp 12   SpO2 100%   Physical Exam Vitals and nursing note  reviewed.  Constitutional:      Comments: Awake answers questions but without stimulation falls back asleep.  HENT:     Head: Atraumatic.     Right Ear: External ear normal.     Left Ear: External ear normal.  Eyes:     Pupils: Pupils are equal, round, and reactive to light.  Cardiovascular:     Rate and Rhythm: Normal rate and regular rhythm.  Pulmonary:     Breath sounds: No wheezing or rhonchi.  Abdominal:     Tenderness: There is no abdominal tenderness.  Musculoskeletal:     Cervical back: Neck supple.     Comments: Left foot in walking boot.  Skin:    General: Skin is warm.     Capillary Refill: Capillary refill takes less than 2 seconds.  Neurological:     Comments: Awake and answers questions.  However without stimulation falls back asleep.  May have some mild confusion  Psychiatric:        Mood and Affect: Mood normal.     ED Results / Procedures / Treatments   Labs (all labs ordered are listed, but only abnormal results are displayed) Labs Reviewed  COMPREHENSIVE METABOLIC PANEL - Abnormal; Notable for the following components:      Result Value   Chloride 93 (*)    BUN 29 (*)    Creatinine, Ser 9.10 (*)    Calcium 8.7 (*)    GFR, Estimated 6 (*)    Anion gap 16 (*)    All other components within normal limits  CBC WITH DIFFERENTIAL/PLATELET - Abnormal; Notable for the following components:   RBC 3.35 (*)    Hemoglobin 11.6 (*)    HCT 34.4 (*)    MCV 102.7 (*)    MCH 34.6 (*)    All other components within normal limits  TROPONIN I (HIGH SENSITIVITY) - Abnormal; Notable for the following components:   Troponin I (High Sensitivity) 29 (*)    All other components within normal limits  ETHANOL  TROPONIN I (HIGH SENSITIVITY)    EKG None  Radiology  DG Lumbar Spine Complete  Result Date: 07/03/2020 CLINICAL DATA:  54 year old male with fall and back pain. EXAM: LUMBAR SPINE - COMPLETE 4+ VIEW COMPARISON:  CT abdomen pelvis dated 10/13/2017. FINDINGS:  Tiny bone fragment from the right lateral corner of the inferior endplate of L3, age indeterminate, likely chronic. Clinical correlation is recommended. No other acute fracture or subluxation. Mild multilevel chronic compression deformities. Multilevel degenerative changes with disc space narrowing primarily at L3-L4. There are multilevel lower lumbar facet arthropathy. The visualized posterior elements appear intact. There is advanced atherosclerotic calcification of the abdominal aorta. The soft tissues are unremarkable. IMPRESSION: Age indeterminate, likely chronic bone fragment from the right lateral inferior endplate of L3. Clinical correlation is recommended. No other acute fracture or subluxation. Electronically Signed   By: Anner Crete M.D.   On: 07/03/2020 16:08   DG Ankle Complete Left  Result Date: 07/03/2020 CLINICAL DATA:  Pain, fall EXAM: LEFT ANKLE COMPLETE - 3+ VIEW COMPARISON:  None. FINDINGS: Vascular calcifications. Acute fracture involving the distal fibula with extension of lucency to the ankle joint, less than 1/4 shaft diameter lateral displacement distal fracture fragment. Acute avulsion fracture fragments likely from the tip of the medial malleolus. Ankle mortise is symmetric. Positive for soft tissue swelling. IMPRESSION: 1. Acute mildly displaced distal fibular fracture. 2. Acute displaced avulsion fractures at the tip of the medial malleolus. Electronically Signed   By: Donavan Foil M.D.   On: 07/03/2020 16:06   CT Head Wo Contrast  Result Date: 07/04/2020 CLINICAL DATA:  Recent trauma with altered mental status EXAM: CT HEAD WITHOUT CONTRAST TECHNIQUE: Contiguous axial images were obtained from the base of the skull through the vertex without intravenous contrast. COMPARISON:  None. FINDINGS: Brain: Ventricles and sulci are normal in size and configuration. There is no evident intracranial mass, hemorrhage, extra-axial fluid collection, or midline shift. Brain parenchyma  appears unremarkable. No acute infarct is appreciable. Mild basal ganglia calcification bilaterally is likely physiologic. Vascular: No hyperdense vessel. There is slight calcification in the left carotid siphon region. Skull: Bony calvarium a appears intact. Sinuses/Orbits: Mucosal thickening noted in several ethmoid air cells. Other visualized paranasal sinuses are clear. Orbits appear symmetric bilaterally. Other: Mastoid air cells are clear. IMPRESSION: Appearing brain parenchyma.  No mass or hemorrhage. Mild arterial vascular calcification noted. Mucosal thickening noted in several ethmoid air cells. Electronically Signed   By: Lowella Grip III M.D.   On: 07/04/2020 11:32   DG Chest Portable 1 View  Result Date: 07/04/2020 CLINICAL DATA:  Shortness of breath EXAM: PORTABLE CHEST 1 VIEW COMPARISON:  03/18/2020 FINDINGS: Cardiac shadow is prominent but stable. Stable interstitial changes are again identified chronic in nature. Chronic blunting of the right costophrenic angle is seen. Multiple healed rib fractures are seen on the right. No other focal abnormality is noted. IMPRESSION: Chronic interstitial changes stable from the previous exam. No acute abnormality noted. Electronically Signed   By: Inez Catalina M.D.   On: 07/04/2020 10:51    Procedures Procedures (including critical care time)  Medications Ordered in ED Medications - No data to display  ED Course  I have reviewed the triage vital signs and the nursing notes.  Pertinent labs & imaging results that were available during my care of the patient were reviewed by me and considered in my medical decision making (see chart for details).    MDM Rules/Calculators/A&P  Patient brought in for reported syncope and altered mental status while at dialysis.  Patient states that he had to push on his chest to wake him up.  Sounds as if it was more of a sternal rub as opposed to chest compressions.  States he is  tired because he did not sleep much last night.  However patient falls asleep during my history.  Seen yesterday after a fall with syncope.  Discharge home with distal fibular fracture and malleolar fracture on left.  States he took some muscle relaxer last night and some pain medicine.  States he has had these before.  Had not had head CT done yesterday but had done today and was reassuring.  However with patient's syncope and decreased mental status potentially due to overmedication I feel patient would benefit from admission to the hospital.  Patient is complaining of severe pain in his left foot at this time also. EKG reassuring.  Troponin at baseline.  Doubt pulmonary embolism as a cause of the syncope.  Patient will need admission for pain control syncope work-up and potentially adjustment of his medications. Final Clinical Impression(s) / ED Diagnoses Final diagnoses:  Syncope, unspecified syncope type  Foot pain, left  End stage renal disease on dialysis [facility])    Rx / DC Orders ED Discharge Orders    None       Davonna Belling, MD 07/04/20 1358

## 2020-07-04 NOTE — ED Triage Notes (Signed)
Pt arrives via EMS from HD. Pt unable to complete full session due to becoming SOB. Pt placed on 2 liters [place] with HD. Per EMS pt SpO2 on arrival 98%RA and no distress noted.  Pt difficult to arouse during triage. alert and oriented X3.

## 2020-07-04 NOTE — ED Notes (Addendum)
Pt brought to unit asleep; snoring loudly. Difficult to arouse. Pt wakes, answers questions somewhat innapropriately, immediately falls back asleep with loud audible snoring and becomes hard to arouse (less than 10 seconds).  Shallow breaths followed by short apneic phase, then deep snoring inspiration.  Physical Therapy at bedside. Orthostatic BPs terminated after sitting patient up. Patient jerks as if he is rapidly nodding off.

## 2020-07-04 NOTE — ED Notes (Signed)
At change of shift, pt aao4, gcs15, nadn, eating dinner tray at this time. NADN. VSS on ccm. Side rails up, call bell in place.

## 2020-07-04 NOTE — Hospital Course (Addendum)
Syncope Patient w/ a hx of syncope while on dialysis who presented to the ED after another episode of syncope at dialysis earlier today. CT head negative for acute intracranial abnormality. Likely multifactorial in the setting of significant fluid being taken off during dialysis sessions combined with patient taking centrally acting medications (muscle relaxer). Advised to avoid muscle relaxers. Will need less fluid taken off during dialysis. Instructed to follow up with PCP.    ESRD on MWF Anemia of chronic disease Hemoglobin stable. Hgb stable at 11.2 on day of discharge.  Does not look overly fluid overloaded. Potassium normal. Will continue HD on Friday at his dialysis center.     Diastolic HF Repeat ECHO shows EF 60-65%, normal LVSF, grade 1 diastolic dysfunction, no valvular abnormalities. Will follow up with PCP as needed.    Closed fracture of left ankle Sustained fracture 12/14. Evaluated in the ED and sent home with clutches and CAM boot. He continued to have left aknle swelling and pain admission. Ortho was consulted and they recommend he keep wearing CAM boot and not put pressure on foot. Schedule to see orthopedic next week on 07/11/20. PT/OT recommended Home Health w/ PT/OT. DME orders for walker, BSC, shower bench sent.   Hyperthyroidism Stable. Last TSH normal at 2.30 four months ago. Repeat TSH normal at 1.176. Continued Methimazole 7.5 mg daily at discharge.   HTN Patient found to be hypertensive on arrival to 149/72. BP improved to 140/86 on day of discharge. Continue home meds.   COPD/asthma OSA On Symbicort and Duoneb at home. No oxygen requirement during admission. Continue home meds. Consider need for CPAP at night to help control BP.    HIV Chronic and stable. Sees [name]. Undetectable viral load. Continue home Retrovir 300mg  and Edurant 25 mg.

## 2020-07-04 NOTE — H&P (Signed)
Date: 07/04/2020               Patient Name:  Darrell Johnson MRN: [id number]  DOB: 04-16-1966 Age / Sex: 54 y.o., adult   PCP: Sonia Side., FNP         Medical Service: Internal Medicine Teaching Service         Attending Physician: Dr. Davonna Belling, MD    First Contact: Dr. Linwood Dibbles, MD Pager: [phone]  Second Contact: Dr. Marianna Payment, DO Pager: [phone]       After Hours (After 5p/  First Contact Pager: [phone]  weekends / holidays): Second Contact Pager: [phone]   Chief Complaint: Syncope  History of Present Illness: [name] is a 54 year old male with PMH of ESRD on MWF schedule, HIV, HLD, HTN, and anxiety who presented to the ED due to syncope. Patient states yesterday, he walked down the hall to the bathroom, pop his leg and hit the floor. He came to the ED. He was diagnosed with a distal fibular fracture and malleolar fracture on left. He refused a head CT. He was sent home with a 5-day course days of Norco 5 mg. Patient states he took one Norco at night. Wednesday morning, he went to dialysis as usual. He was taking to the nurse and a few minutes later he felt someone rubbing on his chest. He was told that he passed out. States he has a history of passing out during dialysis especially when they take took much fluid of him. He did no feel confused afterwards or urinate on himself. He reports a hx of back cramps and back pain in which he takes a muscle relaxer that starts with a "B" but has not been prescribed it. He endorse ankle pain from his recent fall, but denies chest pain, SHOB, headaches, blurry vision, no stomach pain, nausea or vomiting, chills or fever.    Meds:  Current Meds  Medication Sig  . acetaminophen (TYLENOL) 500 MG tablet Take 500-1,000 mg by mouth every 6 (six) hours as needed for mild pain or headache.  . albuterol (PROVENTIL HFA;VENTOLIN HFA) 108 (90 Base) MCG/ACT inhaler Inhale 2 puffs into the lungs every 4 (four)  hours as needed for up to 30 days for wheezing or shortness of breath.  Marland Kitchen amLODipine (NORVASC) 10 MG tablet Take 1 tablet (10 mg total) by mouth daily. (Patient taking differently: Take 10 mg by mouth at bedtime.)  . azelastine (OPTIVAR) 0.05 % ophthalmic solution Place 1 drop into both eyes 2 (two) times daily.  Marland Kitchen buPROPion (WELLBUTRIN) 75 MG tablet Take 75 mg by mouth 2 (two) times daily.  . carvedilol (COREG) 25 MG tablet Take 1 tablet (25 mg total) by mouth 2 (two) times daily with a meal.  . ferric citrate (AURYXIA) 1 GM 210 MG(Fe) tablet Take 630 mg by mouth 3 (three) times daily with meals.  Marland Kitchen lisinopril (ZESTRIL) 20 MG tablet Take 20 mg by mouth at bedtime.  . polyethylene glycol (MIRALAX / GLYCOLAX) 17 g packet Take 17 g by mouth daily.  . pravastatin (PRAVACHOL) 40 MG tablet TAKE 1 TABLET BY MOUTH EVERYDAY AT BEDTIME (Patient taking differently: Take 40 mg by mouth at bedtime.)    Allergies: Allergies as of 07/04/2020  . (No Known Allergies)   Past Medical History:  Diagnosis Date  . Anemia   . Anxiety   . Arthritis    "knees" (04/13/2017)  . Asthma   . CHF (congestive  heart failure) ([place])   . ESRD (end stage renal disease) on dialysis [facility])    "MWF; [facility]" (04/13/2017)  . Gout, unspecified 08/13/2009   Qualifier: Diagnosis of  By: Redmond Pulling  MD, Mateo Flow    . H1N1 influenza    March 2016  . History of blood transfusion 02/2017   "related to OR"  . HIV infection ([place]) 1980's   on ART therapy since, followed by ID clinic, complicated  by neuropathy  . Hyperlipidemia    hypertrygliceridemia determined ti be secondary to ART therpay  . HYPERTENSION 05/08/2006  . Pneumonia    "once; years ago" (04/13/2017)  . Rib fractures 01/2009  . Seizures ([place])    last seizure was in the 1990s, pt has family history of seizures; "probably related to alcohol" (04/13/2017)  . 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 Syphilis 1997   history of syphilis 1997  . Tobacco abuse    Family History  Problem Relation Age of Onset  . Cancer Mother   . Hypertension Mother   . COPD Father   . Hypertension Father   . Diabetes Sister   . Hypertension Sister   . Diabetes Brother   . Hypertension Brother   . Stroke Neg Hx     Social History: Patient lives in [place] with his wife. 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 States he current smokes 2-4 cigarettes a week. He quit for one year and he is now back to smoking occassionally. States he drinks alcohol occassionally.   Review of Systems: A complete ROS was negative except as per HPI.   Physical Exam: Blood pressure (!) 158/81, pulse 85, temperature 98.7 F (37.1 C), temperature source Oral, resp. rate 12, SpO2 100 %.  General: Pleasant, well-appearing middle-age man laying in bed. No acute distress. Head: Normocephalic. Atraumatic. CV: RRR. No murmurs, rubs, or gallops. No LE edema Pulmonary: Lungs CTAB. Normal effort. No wheezing or rales. Abdominal: Soft, nontender, nondistended. Normal bowel sounds. Extremities: Palpable pulses. Normal ROM. Left foot in a walking boot Skin: Warm and dry. No obvious rash or lesions. Neuro: A&Ox3. Moves all extremities. Strength 5/5 in all extremities. Normal sensation. No focal deficit. Psych: Normal mood and affect  EKG: personally reviewed my interpretation is Normal Sinus w/ incomplete RBBB, J point elevation and some Q waves that is unchanged from previous  CXR: personally reviewed my interpretation is multiple healed rib fractures on the right but no acute cardiopulmonary disease.   Assessment & Plan by Problem: Active Problems:   Syncope  Darrell Johnson is a 54 year old male with PMH of ESRD on MWF schedule, HIV, HLD, HTN, and anxiety who presented to the ED after a syncope episode at dialysis.    #Syncope Patient w/ a hx of syncope while on dialysis who presented to the ED after another episode of syncope at dialysis earlier today. CT head negative for acute intracranial abnormality. The nature of syncope episode not consistent with seizure. Patient's has some moderate MR on ECHO from last year, but EF > 65%. No hx of arrhythmias. Patient's syncope likely 2/2 overmedication on centrally acting medication vs. Orthostatic. No evidence of hypoglycemia. Ethanol levels normal. Will admit to observation and monitor closely. --Pending orthostatic vitals --Electrolyte repletion --Tele --Swallow screen --PT/OT eval and treat --F/u UDS --CBC, CMP --Hold  centrally acting meds  #ESRD on MWF Patient unable to complete dialysis today. Patient euvolemic on exam. Normal potassium.  --Consider nephro consult --F/u mag, phos --Strict I&Os  #Diastolic HF ECHO from 01/8675 shows EF >65% with mod-severe mitral regurgitation. Patient does not look fluid overloaded on exam. Lung CTAB. No rales or wheezing. --F/u repeat ECHO. --Strict I&Os  #Hyperthyroidism Stable. Last TSH normal at 2.30 four months ago.  --F/u repeat TSH --Methimazole 7.5 mg daily  #HTN Patient found to be hypertensive on arrival to 149/72. BP still elevated.  --Amlodipine 2.5 mg daily --Daily vitals  #COPD/asthma #OSA On Symbicort and Duoneb at home. Patient no oxygen requiring. No wheezing on exam. Satting well on room air. --Pulse Ox --Consider starting on CPAP while admitted  #Anemia of chronic disease Hemoglobin stable. Hgb stable at 11.6 --F/u AM CBC  #HIV Chronic and stable. Sees [name]. Undetectable viral load. --Continue Tivicay 50 mg, Retrovir 300mg  and Edurant 25 mg   CODE STATUS: Full Code DIET: Renal PPx: Heparin  Dispo: Admit patient to Observation with expected length of stay less than 2 midnights.  Signed: Lacinda Axon, MD 07/04/2020, 2:07 PM  Pager: [phone] Internal  Medicine Teaching Service After 5pm on weekdays and 1pm on weekends: On Call pager: [phone]

## 2020-07-04 NOTE — Evaluation (Signed)
Physical Therapy Evaluation Patient Details Name: Darrell Johnson MRN: [id number] DOB: January 19, 1966 Today's Date: 07/04/2020   History of Present Illness  Pt is a 54 y/o male admitted after syncopal epsiode at HD center. Pt presented to the ED on 12/14 as well for a L distal fibula fx. Was placed in CAM walker and was to follow up outpatient. PMH includes HIV, HTN, ESRD on HD, CHF, and seizures.  Clinical Impression  Pt admitted secondary to problem above with deficits below. Pt very lethargic throughout session. Would wake up to answer some questions, however, quickly fell asleep. Pt also with cognitive deficits as he reports he was never discharged after falling and breaking his L ankle, when in fact he did go home. NT in room to assist with mobility and pt requiring mod to max A +2. Was unable to stand as pt would fall asleep in sitting and had jerking type motions. Feel pt is at increased risk for falls. Recommending SNF at this time, however, may progress well once more alert. Will continue to follow acutely and update recommendations based on pt progression.    Follow Up Recommendations SNF;Supervision/Assistance - 24 hour    Equipment Recommendations  Wheelchair cushion (measurements PT);Wheelchair (measurements PT);Rolling walker with 5" wheels    Recommendations for Other Services   OT consult    Precautions / Restrictions Precautions Precautions: Fall Precaution Comments: Recent fall Required Braces or Orthoses: Other Brace Other Brace: CAM walker on LLE Restrictions Weight Bearing Restrictions: Yes LLE Weight Bearing: Non weight bearing      Mobility  Bed Mobility Overal bed mobility: Needs Assistance Bed Mobility: Supine to Sit;Sit to Supine     Supine to sit: Mod assist;+2 for physical assistance Sit to supine: Max assist;+2 for physical assistance   General bed mobility comments: Tech present in room to assist with mobility. Required increased time, as pt falling  asleep frequently. However, pt was eventually awake enough to assist. Once sitting EOB, pt falling asleep easily and had some jerking type movements. Unsafe to attempt further mobility.    Transfers                    Ambulation/Gait                Stairs            Wheelchair Mobility    Modified Rankin (Stroke Patients Only)       Balance Overall balance assessment: Needs assistance Sitting-balance support: No upper extremity supported Sitting balance-Leahy Scale: Poor Sitting balance - Comments: Reliant on mod to max A to maintain sitting balance.                                     Pertinent Vitals/Pain Pain Assessment: Faces Faces Pain Scale: Hurts even more Pain Location: L ankle Pain Descriptors / Indicators: Moaning;Grimacing;Guarding Pain Intervention(s): Limited activity within patient's tolerance;Monitored during session;Repositioned    Home Living Family/patient expects to be discharged to:: Private residence Living Arrangements: Spouse/significant other Available Help at Discharge: Family;Available PRN/intermittently Type of Home: House Home Access: Level entry     Home Layout: One level Home Equipment: Crutches Additional Comments: Info from previous encounter as pt lethargic throughout most of session    Prior Function           Comments: Pt was sent home with crutches on 12/14, however, could not remember how he  was ambulating. States he has been in the hospital the entire time and does not remember going home.     Hand Dominance        Extremity/Trunk Assessment   Upper Extremity Assessment Upper Extremity Assessment: Defer to OT evaluation    Lower Extremity Assessment Lower Extremity Assessment: LLE deficits/detail;Generalized weakness LLE Deficits / Details: L foot in CAM walker boot    Cervical / Trunk Assessment Cervical / Trunk Assessment: Kyphotic  Communication   Communication:  Expressive difficulties  Cognition Arousal/Alertness: Lethargic Behavior During Therapy: Flat affect Overall Cognitive Status: No family/caregiver present to determine baseline cognitive functioning                                 General Comments: Pt very lethargic throughout. Would wake up when asked a question, but would immediately fall back asleep. On EOB, pt falling asleep as well. Pt reports he was never sent home from ED yesterday, however, he was.      General Comments General comments (skin integrity, edema, etc.): No family present    Exercises     Assessment/Plan    PT Assessment Patient needs continued PT services  PT Problem List Decreased strength;Decreased balance;Decreased activity tolerance;Decreased mobility;Decreased cognition;Decreased knowledge of use of DME;Decreased safety awareness;Decreased knowledge of precautions;Pain       PT Treatment Interventions DME instruction;Gait training;Functional mobility training;Therapeutic activities;Therapeutic exercise;Balance training;Patient/family education    PT Goals (Current goals can be found in the Care Plan section)  Acute Rehab PT Goals PT Goal Formulation: Patient unable to participate in goal setting Time For Goal Achievement: 07/18/20 Potential to Achieve Goals: Fair    Frequency Min 2X/week   Barriers to discharge        Co-evaluation               AM-PAC PT "6 Clicks" Mobility  Outcome Measure Help needed turning from your back to your side while in a flat bed without using bedrails?: A Lot Help needed moving from lying on your back to sitting on the side of a flat bed without using bedrails?: A Lot Help needed moving to and from a bed to a chair (including a wheelchair)?: Total Help needed standing up from a chair using your arms (e.g., wheelchair or bedside chair)?: Total Help needed to walk in hospital room?: Total Help needed climbing 3-5 steps with a railing? : Total 6  Click Score: 8    End of Session Equipment Utilized During Treatment: Other (comment) (CAM walker boot) Activity Tolerance: Patient limited by lethargy Patient left: in bed;with call bell/phone within reach (on stretcher in ED) Nurse Communication: Mobility status PT Visit Diagnosis: Unsteadiness on feet (R26.81);History of falling (Z91.81);Muscle weakness (generalized) (M62.81);Repeated falls (R29.6)    Time: 6629-4765 PT Time Calculation (min) (ACUTE ONLY): 17 min   Charges:   PT Evaluation $PT Eval Moderate Complexity: 1 Mod          Reuel Derby, PT, DPT  Acute Rehabilitation Services  Pager: [phone] Office: [phone]   Rudean Hitt 07/04/2020, 4:52 PM

## 2020-07-04 NOTE — ED Notes (Signed)
Back from dialysis.

## 2020-07-05 ENCOUNTER — Observation Stay (HOSPITAL_BASED_OUTPATIENT_CLINIC_OR_DEPARTMENT_OTHER): Payer: Medicare Other

## 2020-07-05 ENCOUNTER — Encounter (HOSPITAL_COMMUNITY): Payer: Self-pay | Admitting: Internal Medicine

## 2020-07-05 DIAGNOSIS — N186 End stage renal disease: Secondary | ICD-10-CM

## 2020-07-05 DIAGNOSIS — R55 Syncope and collapse: Secondary | ICD-10-CM

## 2020-07-05 DIAGNOSIS — Z992 Dependence on renal dialysis: Secondary | ICD-10-CM | POA: Diagnosis not present

## 2020-07-05 DIAGNOSIS — M79672 Pain in left foot: Secondary | ICD-10-CM

## 2020-07-05 LAB — BASIC METABOLIC PANEL
Anion gap: 17 — ABNORMAL HIGH (ref 5–15)
BUN: 46 mg/dL — ABNORMAL HIGH (ref 6–20)
CO2: 28 mmol/L (ref 22–32)
Calcium: 8.9 mg/dL (ref 8.9–10.3)
Chloride: 91 mmol/L — ABNORMAL LOW (ref 98–111)
Creatinine, Ser: 12.47 mg/dL — ABNORMAL HIGH (ref 0.61–1.24)
GFR, Estimated: 4 mL/min — ABNORMAL LOW (ref >60)
Glucose, Bld: 119 mg/dL — ABNORMAL HIGH (ref 70–99)
Potassium: 4.2 mmol/L (ref 3.5–5.1)
Sodium: 136 mmol/L (ref 135–145)

## 2020-07-05 LAB — ECHOCARDIOGRAM COMPLETE
Area-P 1/2: 3.03 cm2
Calc EF: 56.5 %
S' Lateral: 3.5 cm
Single Plane A2C EF: 50.5 %
Single Plane A4C EF: 63.4 %

## 2020-07-05 LAB — CBC
HCT: 32.5 % — ABNORMAL LOW (ref 39.0–52.0)
Hemoglobin: 11.2 g/dL — ABNORMAL LOW (ref 13.0–17.0)
MCH: 35.1 pg — ABNORMAL HIGH (ref 26.0–34.0)
MCHC: 34.5 g/dL (ref 30.0–36.0)
MCV: 101.9 fL — ABNORMAL HIGH (ref 80.0–100.0)
Platelets: 186 10*3/uL (ref 150–400)
RBC: 3.19 MIL/uL — ABNORMAL LOW (ref 4.22–5.81)
RDW: 14.5 % (ref 11.5–15.5)
WBC: 5.9 10*3/uL (ref 4.0–10.5)
nRBC: 0 % (ref 0.0–0.2)

## 2020-07-05 NOTE — Progress Notes (Signed)
  Echocardiogram 2D Echocardiogram has been performed.  Darrell Johnson 07/05/2020, 8:40 AM

## 2020-07-05 NOTE — Discharge Summary (Signed)
Name: Darrell Johnson MRN: [id number] DOB: 28-Dec-1965 54 y.o. PCP: Sonia Side., FNP  Date of Admission: 07/04/2020  9:57 AM Date of Discharge: 07/05/2020 Attending Physician: Dr. Jimmye Norman  Discharge Diagnosis: Principal Problem:   Syncope    Discharge Medications: Allergies as of 07/05/2020   No Known Allergies     Medication List    TAKE these medications   acetaminophen 500 MG tablet Commonly known as: TYLENOL Take 500-1,000 mg by mouth every 6 (six) hours as needed for mild pain or headache.   albuterol 108 (90 Base) MCG/ACT inhaler Commonly known as: VENTOLIN HFA Inhale 2 puffs into the lungs every 6 (six) hours as needed for wheezing or shortness of breath.   amitriptyline 25 MG tablet Commonly known as: ELAVIL TAKE 1 TABLET BY MOUTH EVERYDAY AT BEDTIME What changed: See the new instructions.   amLODipine 2.5 MG tablet Commonly known as: NORVASC Take 2.5 mg by mouth at bedtime.   aspirin EC 325 MG tablet Take 1 tablet (325 mg total) by mouth daily.   azelastine 0.05 % ophthalmic solution Commonly known as: OPTIVAR Place 1 drop into both eyes 2 (two) times daily.   budesonide-formoterol 160-4.5 MCG/ACT inhaler Commonly known as: SYMBICORT Inhale 2 puffs into the lungs 2 (two) times daily. What changed:   when to take this  reasons to take this   buPROPion 75 MG tablet Commonly known as: WELLBUTRIN Take 75 mg by mouth 2 (two) times daily.   carvedilol 25 MG tablet Commonly known as: COREG Take 1 tablet (25 mg total) by mouth 2 (two) times daily with a meal.   ferric citrate 1 GM 210 MG(Fe) tablet Commonly known as: AURYXIA Take 630 mg by mouth 3 (three) times daily with meals.   HYDROcodone-acetaminophen 5-325 MG tablet Commonly known as: NORCO/VICODIN Take 1 tablet by mouth every 6 (six) hours as needed. What changed: reasons to take this   ipratropium-albuterol 0.5-2.5 (3) MG/3ML Soln Commonly known as: DUONEB Take 3 mLs by  nebulization every 6 (six) hours as needed.   Juluca 50-25 MG Tabs Generic drug: Dolutegravir-Rilpivirine TAKE 1 TABLET BY MOUTH DAILY WITH SUPPER.   lisinopril 20 MG tablet Commonly known as: ZESTRIL Take 20 mg by mouth at bedtime.   methimazole 5 MG tablet Commonly known as: Tapazole Take 1.5 tablets (7.5 mg total) by mouth daily.   omeprazole 40 MG capsule Commonly known as: PRILOSEC Take 40 mg by mouth daily.   polyethylene glycol 17 g packet Commonly known as: MIRALAX / GLYCOLAX Take 17 g by mouth daily.   pravastatin 40 MG tablet Commonly known as: PRAVACHOL TAKE 1 TABLET BY MOUTH EVERYDAY AT BEDTIME What changed: See the new instructions.   sildenafil 100 MG tablet Commonly known as: VIAGRA Take 100 mg by mouth daily as needed for erectile dysfunction.   triamcinolone ointment 0.5 % Commonly known as: KENALOG Apply 1 application topically daily as needed (rash/itching).   zidovudine 300 MG tablet Commonly known as: RETROVIR TAKE 1 TABLET (300 MG TOTAL) BY MOUTH DAILY.      Disposition and follow-up:   Darrell Johnson was discharged from [facility] in Stable condition. At the hospital follow up visit please address:  1.  Follow-up:  A. Syncope: This was likely a combination of taking too much fluid off during dialysis and taking too many centrally acting meds. Patient is advised to stop taking muscle relaxers with his narcotics. Pleasure assess adherence to this advice.  B. Left ankle fracture: Patient is advised to wear put and remain nonweight-bearing until he sees orthopedic on 07/11/20. Please ensure he follows up with Ortho.    C. ESRD: Patient will continue regular dialysis schedule. Will need to establish new dry weight as he has had multiple episodes of syncope during dialysis due too too much fluid being removed.   2.  Labs / imaging needed at time of follow-up: BMP, CBC  3.  Pending labs/ test needing follow-up:  None  Follow-up Appointments:  Follow-up Information    Sonia Side., FNP. Go on 07/09/2020.   Specialty: Family Medicine Why: Go to this appointment at 4:20 pm Contact information: [place] [id number] [facility] Course by problem list: Syncope Patient w/ a hx of syncope while on dialysis who presented to the ED after another episode of syncope at dialysis earlier today. CT head negative for acute intracranial abnormality. Likely multifactorial in the setting of significant fluid being taken off during dialysis sessions combined with patient taking centrally acting medications (muscle relaxer). Advised to avoid muscle relaxers. Will need less fluid taken off during dialysis. Instructed to follow up with PCP.    ESRD on MWF Anemia of chronic disease Hemoglobin stable. Hgb stable at 11.2 on day of discharge.  Does not look overly fluid overloaded. Potassium normal. Will continue HD on Friday at his dialysis center.     Diastolic HF Repeat ECHO shows EF 60-65%, normal LVSF, grade 1 diastolic dysfunction, no valvular abnormalities. Will follow up with PCP as needed.    Closed fracture of left ankle Sustained fracture 12/14. Evaluated in the ED and sent home with clutches and CAM boot. He continued to have left aknle swelling and pain admission. Ortho was consulted and they recommend he keep wearing CAM boot and not put pressure on foot. Schedule to see orthopedic next week on 07/11/20. PT/OT recommended Home Health w/ PT/OT. DME orders for walker, BSC, shower bench sent.   Hyperthyroidism Stable. Last TSH normal at 2.30 four months ago. Repeat TSH normal at 1.176. Continued Methimazole 7.5 mg daily at discharge.   HTN Patient found to be hypertensive on arrival to 149/72. BP improved to 140/86 on day of discharge. Continue home meds.   COPD/asthma OSA On Symbicort and Duoneb at home. No oxygen requirement during admission. Continue  home meds. Consider need for CPAP at night to help control BP.    HIV Chronic and stable. Sees [name]. Undetectable viral load. Continue home Retrovir 300mg  and Edurant 25 mg.      Discharge Vitals:   BP 129/83 (BP Location: Left Arm)    Pulse 85    Temp 98.7 F (37.1 C) (Oral)    Resp 16    SpO2 100%   Pertinent Labs, Studies, and Procedures:  CBC Latest Ref Rng & Units 07/05/2020 07/04/2020 07/04/2020  WBC 4.0 - 10.5 K/uL 5.9 5.9 6.3  Hemoglobin 13.0 - 17.0 g/dL 11.2(L) 11.0(L) 11.6(L)  Hematocrit 39.0 - 52.0 % 32.5(L) 34.6(L) 34.4(L)  Platelets 150 - 400 K/uL 186 186 179    CMP Latest Ref Rng & Units 07/05/2020 07/04/2020 07/04/2020  Glucose 70 - 99 mg/dL 119(H) 107(H) 94  BUN 6 - 20 mg/dL 46(H) 39(H) 29(H)  Creatinine 0.61 - 1.24 mg/dL 12.47(H) 11.55(H) 9.10(H)  Sodium 135 - 145 mmol/L 136 135 136  Potassium 3.5 - 5.1 mmol/L 4.2 3.9 3.6  Chloride 98 - 111 mmol/L 91(L) 91(L) 93(L)  CO2 22 - 32 mmol/L 28 29 27   Calcium 8.9 - 10.3 mg/dL 8.9 8.9 8.7(L)  Total Protein 6.5 - 8.1 g/dL - 7.6 7.5  Total Bilirubin 0.3 - 1.2 mg/dL - 0.7 0.8  Alkaline Phos 38 - 126 U/L - 105 106  AST 15 - 41 U/L - 28 27  ALT 0 - 44 U/L - 16 17    CT Head Wo Contrast  Result Date: 07/04/2020 CLINICAL DATA:  Recent trauma with altered mental status EXAM: CT HEAD WITHOUT CONTRAST TECHNIQUE: Contiguous axial images were obtained from the base of the skull through the vertex without intravenous contrast. COMPARISON:  None. FINDINGS: Brain: Ventricles and sulci are normal in size and configuration. There is no evident intracranial mass, hemorrhage, extra-axial fluid collection, or midline shift. Brain parenchyma appears unremarkable. No acute infarct is appreciable. Mild basal ganglia calcification bilaterally is likely physiologic. Vascular: No hyperdense vessel. There is slight calcification in the left carotid siphon region. Skull: Bony calvarium a appears intact. Sinuses/Orbits: Mucosal thickening  noted in several ethmoid air cells. Other visualized paranasal sinuses are clear. Orbits appear symmetric bilaterally. Other: Mastoid air cells are clear. IMPRESSION: Appearing brain parenchyma.  No mass or hemorrhage. Mild arterial vascular calcification noted. Mucosal thickening noted in several ethmoid air cells. Electronically Signed   By: Lowella Grip III M.D.   On: 07/04/2020 11:32   DG Chest Portable 1 View  Result Date: 07/04/2020 CLINICAL DATA:  Shortness of breath EXAM: PORTABLE CHEST 1 VIEW COMPARISON:  03/18/2020 FINDINGS: Cardiac shadow is prominent but stable. Stable interstitial changes are again identified chronic in nature. Chronic blunting of the right costophrenic angle is seen. Multiple healed rib fractures are seen on the right. No other focal abnormality is noted. IMPRESSION: Chronic interstitial changes stable from the previous exam. No acute abnormality noted. Electronically Signed   By: Inez Catalina M.D.   On: 07/04/2020 10:51   ECHOCARDIOGRAM COMPLETE  Result Date: 07/05/2020    ECHOCARDIOGRAM REPORT   Patient Name:   DEVONTA BLANFORD Date of Exam: 07/05/2020 Medical Rec #:  [id number]        Height:       69.0 in Accession #:    [id number]       Weight:       268.5 lb Date of Birth:  March 06, 1966       BSA:          2.342 m Patient Age:    15 years         BP:           129/83 mmHg Patient Gender: M                HR:           81 bpm. Exam Location:  Inpatient Procedure: 2D Echo, Cardiac Doppler and Color Doppler STAT ECHO Indications:    Syncope 780.2 / R55  History:        Patient has prior history of Echocardiogram examinations, most                 recent 02/14/2019. CHF; Risk Factors:Hypertension, Dyslipidemia                 and Former Smoker.  Sonographer:    Vickie Epley RDCS Referring Phys: [place]  1. Left ventricular ejection fraction, by estimation, is 60 to 65%. The left ventricle has normal function. The  left ventricle has no regional  wall motion abnormalities. Left ventricular diastolic parameters are consistent with Grade I diastolic dysfunction (impaired relaxation). Elevated left atrial pressure.  2. Right ventricular systolic function is normal. The right ventricular size is normal.  3. The mitral valve is normal in structure. Trivial mitral valve regurgitation. No evidence of mitral stenosis. Moderate mitral annular calcification.  4. The aortic valve is tricuspid. Aortic valve regurgitation is not visualized. No aortic stenosis is present.  5. The inferior vena cava is normal in size with greater than 50% respiratory variability, suggesting right atrial pressure of 3 mmHg. FINDINGS  Left Ventricle: Left ventricular ejection fraction, by estimation, is 60 to 65%. The left ventricle has normal function. The left ventricle has no regional wall motion abnormalities. The left ventricular internal cavity size was normal in size. There is  no left ventricular hypertrophy. Left ventricular diastolic parameters are consistent with Grade I diastolic dysfunction (impaired relaxation). Elevated left atrial pressure. Right Ventricle: The right ventricular size is normal. Right ventricular systolic function is normal. Left Atrium: Left atrial size was normal in size. Right Atrium: Right atrial size was normal in size. Pericardium: There is no evidence of pericardial effusion. Mitral Valve: The mitral valve is normal in structure. Moderate mitral annular calcification. Trivial mitral valve regurgitation. No evidence of mitral valve stenosis. Tricuspid Valve: The tricuspid valve is normal in structure. Tricuspid valve regurgitation is trivial. No evidence of tricuspid stenosis. Aortic Valve: The aortic valve is tricuspid. Aortic valve regurgitation is not visualized. No aortic stenosis is present. Pulmonic Valve: The pulmonic valve was normal in structure. Pulmonic valve regurgitation is not visualized. No evidence of pulmonic stenosis. Aorta: The aortic  root is normal in size and structure. Venous: The inferior vena cava is normal in size with greater than 50% respiratory variability, suggesting right atrial pressure of 3 mmHg. IAS/Shunts: No atrial level shunt detected by color flow Doppler.  LEFT VENTRICLE PLAX 2D LVIDd:         5.30 cm      Diastology LVIDs:         3.50 cm      LV e' medial:    4.47 cm/s LV PW:         1.00 cm      LV E/e' medial:  19.0 LV IVS:        1.00 cm      LV e' lateral:   5.70 cm/s LVOT diam:     2.00 cm      LV E/e' lateral: 14.9 LV SV:         92 LV SV Index:   39 LVOT Area:     3.14 cm  LV Volumes (MOD) LV vol d, MOD A2C: 116.0 ml LV vol d, MOD A4C: 140.0 ml LV vol s, MOD A2C: 57.4 ml LV vol s, MOD A4C: 51.2 ml LV SV MOD A2C:     58.6 ml LV SV MOD A4C:     140.0 ml LV SV MOD BP:      74.8 ml RIGHT VENTRICLE RV S prime:     17.10 cm/s TAPSE (M-mode): 2.4 cm LEFT ATRIUM             Index       RIGHT ATRIUM          Index LA diam:        4.10 cm 1.75 cm/m  RA Area:     9.06 cm LA Vol (A2C):  20.0 ml 8.54 ml/m  RA Volume:   18.60 ml 7.94 ml/m LA Vol (A4C):   30.0 ml 12.81 ml/m LA Biplane Vol: 24.7 ml 10.55 ml/m  AORTIC VALVE LVOT Vmax:   171.00 cm/s LVOT Vmean:  107.000 cm/s LVOT VTI:    0.293 m  AORTA Ao Root diam: 3.40 cm MITRAL VALVE MV Area (PHT): 3.03 cm     SHUNTS MV Decel Time: 250 msec     Systemic VTI:  0.29 m MV E velocity: 84.80 cm/s   Systemic Diam: 2.00 cm MV A velocity: 114.00 cm/s MV E/A ratio:  0.74 Kirk Ruths MD Electronically signed by Kirk Ruths MD Signature Date/Time: 07/05/2020/8:48:07 AM    Final      Discharge Instructions:   Mr. Wolk,  It was a pleasure taking care of you at [facility] were admitted for syncope and you are now doing much better. We want you to go to your dialysis session tomorrow and follow up with your PCP.  1) Go to your Dialysis session tomorrow 2)You have been scheduled for an appointment with your PCP on 07/09/20 at 4:20pm. Please make sure to go to  follow up. 3) Please follow up with Dr. Langston Masker office on 07/11/20 at 2:40pm to get your ankle checked.    Take care,  Dr. Linwood Dibbles, MD, MPH  Signed: Lacinda Axon, MD 07/05/2020, 9:55 AM   Pager: [phone]

## 2020-07-05 NOTE — Evaluation (Addendum)
Occupational Therapy Evaluation Patient Details Name: Darrell Johnson MRN: [id number] DOB: [birth year] Today's Date: 07/05/2020    History of Present Illness Pt is a 54 y/o male admitted after syncopal epsiode at HD center. Pt presented to the ED on 12/14 as well for a L distal fibula fx. Was placed in CAM walker and was to follow up outpatient. PMH includes HIV, HTN, ESRD on HD, CHF, and seizures.   Clinical Impression   Pt more alert and oriented this date. PTA, pt was living at home with his wife, he was independent with ADL/IADL and modified independent with functional mobility with use of crutches. Pt currently requires minguard for functioanl mobility at RW level, he demonstrates poor adherence and carry over of NWB precaution and requires frequent cues for maintaining NWB status. Pt completed tub transfer and toilet transfer with minguard assistance. Due to decline in current level of function, pt would benefit from acute OT to address established goals to facilitate safe D/C to venue listed below. At this time, recommend HHOT follow-up. Pt with anticipated d/c this date. All education complete. all additional OT needs to be addressed at next venue of care. Will sign off at this time.     Follow Up Recommendations  Home health OT;Supervision - Intermittent    Equipment Recommendations  3 in 1 bedside commode;Tub/shower bench    Recommendations for Other Services       Precautions / Restrictions Precautions Precautions: Fall Precaution Comments: Recent fall Required Braces or Orthoses: Other Brace Other Brace: CAM walker on LLE Restrictions Weight Bearing Restrictions: Yes LLE Weight Bearing: Non weight bearing      Mobility Bed Mobility Overal bed mobility: Independent                  Transfers Overall transfer level: Needs assistance Equipment used: Rolling walker (2 wheeled) Transfers: Sit to/from [facility] Sit to Stand: Min guard Stand  pivot transfers: Min guard       General transfer comment: minguard for safety and stability;he is able to maintain NWB at times but when stopping and starting pt with challenge to shift weight and progress gait without putting weight through L foot, despite multimodal cues    Balance Overall balance assessment: Needs assistance Sitting-balance support: No upper extremity supported Sitting balance-Leahy Scale: Normal     Standing balance support: Single extremity supported;During functional activity Standing balance-Leahy Scale: Fair Standing balance comment: pt requires at least single UE support in standing                           ADL either performed or assessed with clinical judgement   ADL Overall ADL's : Needs assistance/impaired Eating/Feeding: Set up;Sitting   Grooming: Set up;Sitting   Upper Body Bathing: Set up;Sitting   Lower Body Bathing: Min guard;Sit to/from stand   Upper Body Dressing : Set up;Sitting   Lower Body Dressing: Min guard;Sit to/from stand Lower Body Dressing Details (indicate cue type and reason): pt able to don socks sitting EOB, able to don/doff CAM walker boot sitting EOB Toilet Transfer: Min guard;Ambulation;RW Toilet Transfer Details (indicate cue type and reason): frequent cues for NWB through LLE Toileting- Clothing Manipulation and Hygiene: Min guard;Sit to/from stand   Tub/ Shower Transfer: Min guard;Tub bench;Cueing for safety;Cueing for sequencing Tub/Shower Transfer Details (indicate cue type and reason): pt completed tub transfer Functional mobility during ADLs: Rolling walker;Min guard General ADL Comments: pt with need for frequent cues  for NWB, .     Vision Baseline Vision/History: No visual deficits Patient Visual Report: No change from baseline       Perception     Praxis      Pertinent Vitals/Pain Pain Assessment: Faces Faces Pain Scale: Hurts a little bit Pain Location: L ankle Pain Descriptors /  Indicators: Guarding;Grimacing Pain Intervention(s): Limited activity within patient's tolerance;Monitored during session     Hand Dominance Right   Extremity/Trunk Assessment Upper Extremity Assessment Upper Extremity Assessment: Overall WFL for tasks assessed   Lower Extremity Assessment Lower Extremity Assessment: Defer to PT evaluation LLE Deficits / Details: L foot in CAM walker boot   Cervical / Trunk Assessment Cervical / Trunk Assessment: Normal   Communication Communication Communication: Expressive difficulties   Cognition Arousal/Alertness: Awake/alert Behavior During Therapy: WFL for tasks assessed/performed Overall Cognitive Status: Within Functional Limits for tasks assessed                                 General Comments: pt with decreased adherence to NWB precautions, requiring frequent cues to maintain NWB status. oriented x4 decreased safety awareness   General Comments  no family present;SpO2 93%-99% RA with exertion    Exercises     Shoulder Instructions      Home Living Family/patient expects to be discharged to:: Private residence Living Arrangements: Spouse/significant other Available Help at Discharge: Family;Available PRN/intermittently Type of Home: House Home Access: Level entry     Home Layout: One level     Bathroom Shower/Tub: Teacher, early years/pre: Standard     Home Equipment: Crutches   Additional Comments: Info from previous encounter as pt lethargic throughout most of session      Prior Functioning/Environment Level of Independence: Independent        Comments: Pt was sent home with crutches on 12/14, however, could not remember how he was ambulating. States he has been in the hospital the entire time and does not remember going home.        OT Problem List: Decreased range of motion;Decreased activity tolerance;Impaired balance (sitting and/or standing);Decreased safety awareness;Decreased  knowledge of use of DME or AE;Decreased knowledge of precautions;Pain      OT Treatment/Interventions: Self-care/ADL training;Therapeutic exercise;Energy conservation;Therapeutic activities;Patient/family education;Balance training    OT Goals(Current goals can be found in the care plan section) Acute Rehab OT Goals Patient Stated Goal: to go home OT Goal Formulation: With patient Time For Goal Achievement: 07/19/20 Potential to Achieve Goals: Good  OT Frequency: Min 2X/week   Barriers to D/C:            Co-evaluation PT/OT/SLP Co-Evaluation/Treatment: Yes Reason for Co-Treatment: For patient/therapist safety;To address functional/ADL transfers   OT goals addressed during session: ADL's and self-care      AM-PAC OT "6 Clicks" Daily Activity     Outcome Measure Help from another person eating meals?: None Help from another person taking care of personal grooming?: A Little Help from another person toileting, which includes using toliet, bedpan, or urinal?: A Little Help from another person bathing (including washing, rinsing, drying)?: A Little Help from another person to put on and taking off regular upper body clothing?: A Little Help from another person to put on and taking off regular lower body clothing?: A Little 6 Click Score: 19   End of Session Equipment Utilized During Treatment: Gait belt;Rolling walker Nurse Communication: Mobility status;Weight bearing status  Activity Tolerance:  Patient tolerated treatment well Patient left: in bed;with call bell/phone within reach;with bed alarm set  OT Visit Diagnosis: Unsteadiness on feet (R26.81);Other abnormalities of gait and mobility (R26.89);Muscle weakness (generalized) (M62.81);History of falling (Z91.81);Pain Pain - Right/Left: Left Pain - part of body: Leg                Time: 4193-7902 OT Time Calculation (min): 48 min Charges:  OT General Charges $OT Visit: 1 Visit OT Evaluation $OT Eval Moderate Complexity:  1 Mod OT Treatments $Self Care/Home Management : 8-22 mins  Helene Kelp OTR/L Acute Rehabilitation Services Office: [phone]   Wyn Forster 07/05/2020, 2:32 PM

## 2020-07-05 NOTE — Progress Notes (Signed)
Physical Therapy Treatment Patient Details Name: Darrell Johnson MRN: [id number] DOB: [birth year] Today's Date: 07/05/2020    History of Present Illness Pt is a 54 y/o male admitted after syncopal epsiode at HD center. Pt presented to the ED on 12/14 as well for a L distal fibula fx. Was placed in CAM walker and was to follow up outpatient. PMH includes HIV, HTN, ESRD on HD, CHF, and seizures.    PT Comments    Pt much improved over the evaluation.  More alert and participative.  Pt putting his foot down during sit to/from stand transitions and in stance, needing frequent cues to maintain no weight.  Emphasis on safe transfers, gait with cructches then RW for safety and negotiation of stairs with the RW.    Follow Up Recommendations  Home health PT;Supervision - Intermittent     Equipment Recommendations  Rolling walker with 5" wheels;3in1 (PT)    Recommendations for Other Services       Precautions / Restrictions Precautions Precautions: Fall Precaution Comments: Recent fall Required Braces or Orthoses: Other Brace Other Brace: CAM walker on LLE Restrictions Weight Bearing Restrictions: Yes LLE Weight Bearing: Non weight bearing    Mobility  Bed Mobility Overal bed mobility: Independent                Transfers Overall transfer level: Needs assistance Equipment used: Rolling walker (2 wheeled) Transfers: Sit to/from [facility] Sit to Stand: Min guard Stand pivot transfers: Min guard       General transfer comment: minguard for safety and stability;he is able to maintain NWB at times but when stopping and starting pt with challenge to shift weight and progress gait without putting weight through L foot, despite multimodal cues  Ambulation/Gait Ambulation/Gait assistance: Min guard Gait Distance (Feet): 30 Feet (x2, 15 feet with crutches) Assistive device: Rolling walker (2 wheeled);Crutches Gait Pattern/deviations: Step-to pattern Gait  velocity: slower Gait velocity interpretation: <1.8 ft/sec, indicate of risk for recurrent falls General Gait Details: With consistent cuing for NWB status, but able to ambulate with crutchs and min guard to min assist with an unsafe level of stability.  With the RW, pt ambulates with a stable swing to pattern, maintains stability, but still puts his foot down once he comes to a halt or starts/stop gait trial.   Stairs Stairs: Yes Stairs assistance: Min assist Stair Management: No rails;Backwards;With walker Number of Stairs: 2 General stair comments: generally steady hopping backward up the steps with min assist and cues.   Wheelchair Mobility    Modified Rankin (Stroke Patients Only)       Balance Overall balance assessment: Needs assistance Sitting-balance support: No upper extremity supported Sitting balance-Leahy Scale: Normal     Standing balance support: Single extremity supported;During functional activity Standing balance-Leahy Scale: Fair (to poor.  safe in the RW with UE assist) Standing balance comment: pt requires at least single UE support in standing                            Cognition Arousal/Alertness: Awake/alert Behavior During Therapy: WFL for tasks assessed/performed Overall Cognitive Status: Within Functional Limits for tasks assessed                                 General Comments: pt with decreased adherence to NWB precautions, requiring frequent cues to maintain NWB status. oriented x4 decreased  safety awareness      Exercises      General Comments General comments (skin integrity, edema, etc.): no family present;SpO2 93%-99% RA with exertion      Pertinent Vitals/Pain Pain Assessment: Faces Faces Pain Scale: Hurts a little bit Pain Location: L ankle Pain Descriptors / Indicators: Guarding;Grimacing Pain Intervention(s): Monitored during session    Home Living Family/patient expects to be discharged to::  Private residence Living Arrangements: Spouse/significant other Available Help at Discharge: Family;Available PRN/intermittently Type of Home: House Home Access: Level entry   Home Layout: One level Home Equipment: Crutches Additional Comments: Info from previous encounter as pt lethargic throughout most of session    Prior Function Level of Independence: Independent      Comments: Pt was sent home with crutches on 12/14, however, could not remember how he was ambulating. States he has been in the hospital the entire time and does not remember going home.   PT Goals (current goals can now be found in the care plan section) Acute Rehab PT Goals Patient Stated Goal: to go home PT Goal Formulation: With patient Time For Goal Achievement: 07/18/20 Potential to Achieve Goals: Fair Progress towards PT goals: Progressing toward goals    Frequency    Min 2X/week      PT Plan Current plan remains appropriate    Co-evaluation PT/OT/SLP Co-Evaluation/Treatment: Yes Reason for Co-Treatment: For patient/therapist safety PT goals addressed during session: Mobility/safety with mobility OT goals addressed during session: ADL's and self-care      AM-PAC PT "6 Clicks" Mobility   Outcome Measure  Help needed turning from your back to your side while in a flat bed without using bedrails?: None Help needed moving from lying on your back to sitting on the side of a flat bed without using bedrails?: None Help needed moving to and from a bed to a chair (including a wheelchair)?: A Little Help needed standing up from a chair using your arms (e.g., wheelchair or bedside chair)?: A Little Help needed to walk in hospital room?: A Little Help needed climbing 3-5 steps with a railing? : A Little 6 Click Score: 20    End of Session   Activity Tolerance: Patient tolerated treatment well Patient left: in bed;with call bell/phone within reach Nurse Communication: Mobility status PT Visit  Diagnosis: Unsteadiness on feet (R26.81);Other abnormalities of gait and mobility (R26.89);Difficulty in walking, not elsewhere classified (R26.2)     Time: 8412-8208 PT Time Calculation (min) (ACUTE ONLY): 48 min  Charges:  $Gait Training: 8-22 mins                     07/05/2020  Ginger Carne., PT Acute Rehabilitation Services [phone]  (pager) [phone]  (office)   Tessie Fass Melaya [place] 07/05/2020, 4:11 PM

## 2020-07-05 NOTE — Discharge Instructions (Signed)
Mr. Inclan,  It was a pleasure taking care of you at [facility] were admitted for syncope and you are now doing much better. We want you to wear your boot everyday and do not put weight on your left foot. We want you to go to your dialysis session tomorrow and follow up with your PCP.  1) Go to your Dialysis session tomorrow 2)You have been scheduled for an appointment with your PCP on 07/09/20 at 4:20pm. Please make sure to go to follow up. 3) Please follow up with Dr. Langston Masker office on 07/11/20 at 2:40 to get your ankle checked.    Take care,  Dr. Linwood Dibbles, MD, MPH

## 2020-07-05 NOTE — Progress Notes (Addendum)
Subjective:   Hospital day: 2  Overnight event: NAEOV  This AM, patient was assessed laying comfortably in bed. He states he feels better mentally and is more alert today. He continues to endorse left foot pain. Patient states they take too much fluid off him in dialysis, which causes him cramps and that is why he started taking the muscle relaxers. He plans to stop taking the muscle relaxers. He was informed of our plans to schedule a follow up appointment to see her PCP and orthopedic so they can manage. He was informed that his ankle fracture will have to be managed further by orthopaedic in the outpatient.   Objective:  Vital signs in last 24 hours: Vitals:   07/04/20 1901 07/04/20 2342 07/05/20 0000 07/05/20 0453  BP: (!) 150/71 125/65 (!) 130/98 129/83  Pulse: 85 76 77 85  Resp: 12 13 15 16   Temp:  98.7 F (37.1 C) 98.3 F (36.8 C) 98.7 F (37.1 C)  TempSrc:  Oral Oral Oral  SpO2: 95% 94% 100% 100%    Physical Exam  General:Pleasant, well-appearing middle-aged man laying in bed. NAD Head:Normocephalic. Atraumatic. CV: RRR. No m/r/g. Moderate LLE edema to the ankles.  Pulmonary: Lungs CTAB. No wheezes or rales. Normal effort.  Abdominal:Soft,nontender,nondistended. Normal bowel sounds. Extremities: Palpable pulses. Normal ROM. Palpable fistula on RUE. Left foot out of CAM boot.  Skin:Warm and dry. No obvious rash or lesions. Neuro:A&Ox3. Moves all extremities. No focal neuro deficits.  Psych: Normal mood and affect  Assessment/Plan: TIAN MCMURTREY is a 54 y.o. transgender male / male-to-male with PMH of ESRD on MWF schedule, HIV, HLD, HTN, and anxiety who presented to the ED after a syncope episode at dialysis. Plan for discharge today.   Active Problems:   Syncope  #Syncope Patient w/ a hx of syncope while on dialysis who presented to the ED after another episode of syncope at dialysis earlier today. CT head negative for acute intracranial abnormality.  Likely multifactorial in the setting of significant fluid being taken off during dialysis sessions combined with patient taking centrally acting medications (muscle relaxer).  --Advised to avoid muscle relaxers --Instructed to follow up with PCP  #ESRD on MWF Does not look fluid overload. Potassium normal. Will continue dialysis tomorrow.   #Diastolic HF Repeat ECHO shows EF 60-65%, normal LVSF, grade 1 diastolic dysfunction, no valvular abnormalities.  --Follow up with PCP  #Left ankle fracture Sustained fracture 3 days ago. Advised to keep wearing CAM boot and not put pressure on foot. Schedule to see orthopedic next week on 07/11/20.  --PT/OT recommended Home Health w/ PT/OT --DME orders for walker, BSC, shower bench sent.  #Hyperthyroidism Stable. Last TSH normal at 2.30 four months ago. Repeat TSH normal at 1.176. Continued Methimazole 7.5 mg daily at discharge.  #HTN Patient found to be hypertensive on arrival to 149/72. BP improved to 140/86 on day of discharge. Continue home meds.  #COPD/asthma #OSA On Symbicort and Duoneb at home. No oxygen requirement during admission. Continue home meds.   #Anemia of chronic disease Hemoglobin stable. Hgb stable at 11.2 on day of discharge.    #HIV Chronic and stable. Sees [name]. Undetectable viral load. Continue home Retrovir 300mg  and Edurant 25 mg.   Diet: Renal IVF: None VTE: Heparin CODE: Full Code  Prior to Admission Living Arrangement: Home Anticipated Discharge Location: Home Barriers to Discharge: None Dispo: Anticipated discharge today  Signed: Lacinda Axon, MD 07/05/2020, 6:14 AM  Pager: [phone] Internal Medicine Teaching  Service After 5pm on weekdays and 1pm on weekends: On Call pager: [phone]

## 2020-07-05 NOTE — Progress Notes (Signed)
Discharge teaching complete. Meds, diet, activity, follow up appointments reviewed and all questions answered. Copy of instructions given to patient. 305 dollars returned to patient from security.  Patient discharged home via wheelchair with sister.

## 2020-07-05 NOTE — TOC Initial Note (Signed)
Transition of Care [facility]) - Initial/Assessment Note    Patient Details  Name: Darrell Johnson MRN: [id number] Date of Birth: September 24, 1965  [facility] CM/SW Contact:    Marilu Favre, RN Phone Number: 07/05/2020, 2:46 PM  Clinical Narrative:                 Patient from home with wife. Confirmed address [facility] .   No preference on home health agency, Tommi Rumps with [facility] accepted.   Ordered tub bench, 3 in 1 and walker with [facility]   Expected Discharge Plan: [place]     Patient Goals and CMS Choice Patient states their goals for this hospitalization and ongoing recovery are:: to return to home CMS Medicare.gov Compare Post Acute Care list provided to:: Patient Choice offered to / list presented to : Patient  Expected Discharge Plan and Services Expected Discharge Plan: [place]   Discharge Planning Services: CM Consult Post Acute Care Choice: [place] arrangements for the past 2 months: Single Family Home Expected Discharge Date: 07/05/20               DME Arranged: 3-N-1,Tub bench,Walker rolling DME Agency: [employer] Date DME Agency Contacted: 07/05/20 Time DME Agency Contacted: 14 Representative spoke with at DME Agency: Freda Munro HH Arranged: PT          Prior Living Arrangements/Services Living arrangements for the past 2 months: [place] Lives with:: Spouse Patient language and need for interpreter reviewed:: Yes Do you feel safe going back to the place where you live?: Yes      Need for Family Participation in Patient Care: Yes (Comment) Care giver support system in place?: Yes (comment)   Criminal Activity/Legal Involvement Pertinent to Current Situation/Hospitalization: No - Comment as needed  Activities of Daily Living Home Assistive Devices/Equipment: Crutches,Eyeglasses ADL Screening (condition at time of admission) Patient's  cognitive ability adequate to safely complete daily activities?: Yes Is the patient deaf or have difficulty hearing?: No Does the patient have difficulty seeing, even when wearing glasses/contacts?: No Does the patient have difficulty concentrating, remembering, or making decisions?: No Patient able to express need for assistance with ADLs?: Yes Does the patient have difficulty dressing or bathing?: No Independently performs ADLs?: Yes (appropriate for developmental age) Communication: Independent Dressing (OT): Independent Grooming: Independent Does the patient have difficulty walking or climbing stairs?: No Weakness of Legs: Both Weakness of Arms/Hands: Both  Permission Sought/Granted   Permission granted to share information with : No              Emotional Assessment Appearance:: Appears stated age Attitude/Demeanor/Rapport: Engaged Affect (typically observed): Accepting Orientation: : Oriented to Self,Oriented to Place,Oriented to Situation,Oriented to  Time Alcohol / Substance Use: Not Applicable Psych Involvement: No (comment)  Admission diagnosis:  Syncope [R55] End stage renal disease on dialysis ([place]) [N18.6, Z99.2] Foot pain, left [M79.672] Syncope, unspecified syncope type [R55] Patient Active Problem List   Diagnosis Date Noted  . Syncope 07/04/2020  . Hyperthyroidism 10/13/2019  . Acute on chronic respiratory failure with hypoxemia ([place]) 09/05/2019  . GERD (gastroesophageal reflux disease) 02/14/2019  . Depression 02/14/2019  . Elevated troponin 02/14/2019  . Acute on chronic diastolic (congestive) heart failure ([place]) 02/14/2019  . Pulmonary edema 01/31/2019  . Moderate mitral regurgitation   . Acute respiratory failure with hypoxia ([place]) 03/29/2017  . Trigger finger of right hand 09/11/2016  . Hyperlipidemia 03/31/2016  .  Anxiety state 03/11/2016  . Lumbar radiculopathy 01/28/2016  . Seasonal allergies   . Healthcare maintenance 05/03/2015  . Anemia  in chronic kidney disease 08/30/2014  . ESRD on dialysis ([place]) 12/27/2013  . History of syphilis 11/07/2013  . Arthritis 09/09/2013  . Tobacco abuse 09/07/2013  . Chronic pain disorder 04/28/2013  . Gout 08/13/2009  . HIV disease ([place]) 05/08/2006  . Essential hypertension 05/08/2006  . SEIZURE DISORDER 05/08/2006   PCP:  Sonia Side., FNP Pharmacy:   [id number] - [address] [place] [address] Phone: [phone] Fax: [phone]  [facility] [place], [place] - [address] [place], [address] [place] [address] Phone: [phone] Fax: [phone]  [place], [place] - [address] [place] [address] Phone: [phone] Fax: [phone]  [place], [place] [address] Phone: [phone] Fax: [phone]     Social Determinants of Health (SDOH) Interventions    Readmission Risk Interventions No flowsheet data found.

## 2020-07-05 NOTE — Care Management (Signed)
Spoke to [place] in PT/Ot , PT will come and re evaluate   Magdalen Spatz RN

## 2020-07-05 NOTE — Progress Notes (Signed)
Patient on schedule at outpatient HD clinic/[facility] (patient refers to his clinic as "[address]") tomorrow, Friday, 07/06/20 at 5:30am.  Navigator met with patient who states he has no issues with transportation. He states his wife and children provide transportation. His only question is about the plan of care for his broken leg. Navigator cannot answer this question, but sent secure message to Attending team. Patient states no questions or concerns about HD. He only had 2 hours of his regular 4.5 tx yesterday before coming to the hospital. He does not feel he needs tx today and Renal PA states he can wait until his regular tx at this point. Please contact Renal Navigator and consult Nephrology if patient is not discharging today. If he is discharged Friday, Navigator may be able to reschedule his appointment to a second shift appointment at his clinic (noon at the latest).   Alphonzo Cruise, [place] Renal Navigator [phone]

## 2020-07-19 MED FILL — ZIDOVUDINE 300 MG TABLET: 300 | 30 days supply | Qty: 30 | Fill #2

## 2020-07-19 MED FILL — JULUCA 50-25 MG TAB: 50-25 | 30 days supply | Qty: 30 | Fill #2

## 2020-08-06 ENCOUNTER — Encounter (HOSPITAL_COMMUNITY): Payer: Self-pay | Admitting: *Deleted

## 2020-08-06 ENCOUNTER — Other Ambulatory Visit: Payer: Self-pay

## 2020-08-06 ENCOUNTER — Emergency Department (HOSPITAL_COMMUNITY)
Admission: EM | Admit: 2020-08-06 | Discharge: 2020-08-21 | Disposition: E | Payer: Medicare Other | Attending: Emergency Medicine | Admitting: Emergency Medicine

## 2020-08-06 DIAGNOSIS — J45909 Unspecified asthma, uncomplicated: Secondary | ICD-10-CM | POA: Diagnosis not present

## 2020-08-06 DIAGNOSIS — I5033 Acute on chronic diastolic (congestive) heart failure: Secondary | ICD-10-CM | POA: Diagnosis not present

## 2020-08-06 DIAGNOSIS — N186 End stage renal disease: Secondary | ICD-10-CM | POA: Diagnosis not present

## 2020-08-06 DIAGNOSIS — Z992 Dependence on renal dialysis: Secondary | ICD-10-CM | POA: Diagnosis not present

## 2020-08-06 DIAGNOSIS — Z79899 Other long term (current) drug therapy: Secondary | ICD-10-CM | POA: Insufficient documentation

## 2020-08-06 DIAGNOSIS — Z87891 Personal history of nicotine dependence: Secondary | ICD-10-CM | POA: Insufficient documentation

## 2020-08-06 DIAGNOSIS — I469 Cardiac arrest, cause unspecified: Secondary | ICD-10-CM | POA: Diagnosis not present

## 2020-08-06 DIAGNOSIS — R079 Chest pain, unspecified: Secondary | ICD-10-CM | POA: Diagnosis present

## 2020-08-06 DIAGNOSIS — Z7982 Long term (current) use of aspirin: Secondary | ICD-10-CM | POA: Insufficient documentation

## 2020-08-06 DIAGNOSIS — I132 Hypertensive heart and chronic kidney disease with heart failure and with stage 5 chronic kidney disease, or end stage renal disease: Secondary | ICD-10-CM | POA: Insufficient documentation

## 2020-08-21 NOTE — ED Provider Notes (Addendum)
[facility] EMERGENCY DEPARTMENT Provider Note  CSN: [id number] Arrival date & time: 2020/08/20 0530  Chief Complaint(s) Cardiac Arrest  The pt arrived with cprin progress gems   On the lucas he arrived with 0530. C/o chest pain  Hx hiv positive dialysis consistent with his treatments  Ems found rales and wheezes io placed b y ems rt ej by ems the pta was ambulatory to the unit  More sob and anxious when inside ambulance   c-pap placed pt getting more anxious   Stopped breathing c placed on c-pap then started bagging the pt on the way here   He camie on the stretcher  Being bagged on the [employer] airway that was removed and intubated no meds given for that he was being treated for approx 30 minutes before he arrived here  Ems gave 5 epis  Sod bicarb  The pt went from  Bradycardia sinus tach then to asystole  HPI Darrell Johnson is a 55 y.o. male here for cardiac arrest  Remainder of history, ROS, and physical exam limited due to patient's condition (unresponsive; cardiac arrest). Additional information was obtained from EMS.   Level V Caveat.    HPI  Past Medical History Past Medical History:  Diagnosis Date  . Anemia   . Anxiety   . Arthritis    "knees" (04/13/2017)  . Asthma   . CHF (congestive heart failure) ([place])   . ESRD (end stage renal disease) on dialysis [facility])    "MWF; [facility]" (04/13/2017)  . Gout, unspecified 08/13/2009   Qualifier: Diagnosis of  By: Redmond Pulling  MD, Mateo Flow    . H1N1 influenza    March 2016  . History of blood transfusion 02/2017   "related to OR"  . HIV infection ([place]) 1980's   on ART therapy since, followed by ID clinic, complicated  by neuropathy  . Hyperlipidemia    hypertrygliceridemia determined ti be secondary to ART therpay  . HYPERTENSION 05/08/2006  . Pneumonia    "once; years ago" (04/13/2017)  . Rib fractures 01/2009  . Seizures ([place])    last seizure was in the 1990s, pt has family history of seizures; "probably  related to alcohol" (04/13/2017)  . 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 Syphilis 1997   history of syphilis 1997  . Tobacco abuse    Patient Active Problem List   Diagnosis Date Noted  . Foot pain, left   . Syncope 07/04/2020  . Hyperthyroidism 10/13/2019  . Acute on chronic respiratory failure with hypoxemia ([place]) 09/05/2019  . GERD (gastroesophageal reflux disease) 02/14/2019  . Depression 02/14/2019  . Elevated troponin 02/14/2019  . Acute on chronic diastolic (congestive) heart failure ([place]) 02/14/2019  . Pulmonary edema 01/31/2019  . Moderate mitral regurgitation   . Acute respiratory failure with hypoxia ([place]) 03/29/2017  . Trigger finger of right hand 09/11/2016  . Hyperlipidemia 03/31/2016  . Anxiety state 03/11/2016  . Lumbar radiculopathy 01/28/2016  . Seasonal allergies   . Healthcare maintenance 05/03/2015  . Anemia in chronic kidney disease 08/30/2014  . End stage renal disease on dialysis ([place]) 12/27/2013  . History of syphilis 11/07/2013  . Arthritis 09/09/2013  . Tobacco abuse 09/07/2013  . Chronic pain disorder 04/28/2013  . Gout 08/13/2009  . HIV disease ([place]) 05/08/2006  . Essential hypertension 05/08/2006  . SEIZURE  DISORDER 05/08/2006   Home Medication(s) Prior to Admission medications   Medication Sig Start Date End Date Taking? Authorizing Provider  acetaminophen (TYLENOL) 500 MG tablet Take 500-1,000 mg by mouth every 6 (six) hours as needed for mild pain or headache.    [provider]  albuterol (VENTOLIN HFA) 108 (90 Base) MCG/ACT inhaler Inhale 2 puffs into the lungs every 6 (six) hours as needed for wheezing or shortness of breath.    [provider]  amitriptyline (ELAVIL) 25 MG tablet TAKE 1 TABLET BY MOUTH EVERYDAY AT BEDTIME Patient taking differently: Take 25 mg by mouth at  bedtime. 12/20/19   Carlyle Basques, MD  amLODipine (NORVASC) 2.5 MG tablet Take 2.5 mg by mouth at bedtime. 04/25/20   [provider]  aspirin EC 325 MG tablet Take 1 tablet (325 mg total) by mouth daily. 09/07/19 09/06/20  Domenic Polite, MD  azelastine (OPTIVAR) 0.05 % ophthalmic solution Place 1 drop into both eyes 2 (two) times daily.    [provider]  budesonide-formoterol (SYMBICORT) 160-4.5 MCG/ACT inhaler Inhale 2 puffs into the lungs 2 (two) times daily. Patient taking differently: Inhale 2 puffs into the lungs 2 (two) times daily as needed (shortness of breath/wheezing). 09/21/18   Kerin Perna, NP  buPROPion (WELLBUTRIN) 75 MG tablet Take 75 mg by mouth 2 (two) times daily.    [provider]  carvedilol (COREG) 25 MG tablet Take 1 tablet (25 mg total) by mouth 2 (two) times daily with a meal. 09/21/18   Kerin Perna, NP  ferric citrate (AURYXIA) 1 GM 210 MG(Fe) tablet Take 630 mg by mouth 3 (three) times daily with meals.    [provider]  HYDROcodone-acetaminophen (NORCO/VICODIN) 5-325 MG tablet Take 1 tablet by mouth every 6 (six) hours as needed. Patient taking differently: Take 1 tablet by mouth every 6 (six) hours as needed for moderate pain. 07/03/20   Dorie Rank, MD  ipratropium-albuterol (DUONEB) 0.5-2.5 (3) MG/3ML SOLN Take 3 mLs by nebulization every 6 (six) hours as needed. 09/21/18   Kerin Perna, NP  JULUCA 50-25 MG TABS TAKE 1 TABLET BY MOUTH DAILY WITH SUPPER. 05/23/20   Carlyle Basques, MD  lisinopril (ZESTRIL) 20 MG tablet Take 20 mg by mouth at bedtime. 12/23/19   [provider]  methimazole (TAPAZOLE) 5 MG tablet Take 1.5 tablets (7.5 mg total) by mouth daily. 05/24/20   Shamleffer, Melanie Crazier, MD  omeprazole (PRILOSEC) 40 MG capsule Take 40 mg by mouth daily. 02/26/20   [provider]  polyethylene glycol (MIRALAX / GLYCOLAX) 17 g packet Take 17 g by mouth daily.    [provider]   pravastatin (PRAVACHOL) 40 MG tablet TAKE 1 TABLET BY MOUTH EVERYDAY AT BEDTIME Patient taking differently: Take 40 mg by mouth at bedtime. 04/12/20   Kerin Perna, NP  sildenafil (VIAGRA) 100 MG tablet Take 100 mg by mouth daily as needed for erectile dysfunction.    [provider]  triamcinolone ointment (KENALOG) 0.5 % Apply 1 application topically daily as needed (rash/itching).  03/13/20   [provider]  zidovudine (RETROVIR) 300 MG tablet TAKE 1 TABLET (300 MG TOTAL) BY MOUTH DAILY. 05/23/20   Carlyle Basques, MD  Past Surgical History Past Surgical History:  Procedure Laterality Date  . AV FISTULA PLACEMENT Right 12/02/2013   Procedure: ARTERIOVENOUS (AV) FISTULA CREATION;  Surgeon: Rosetta Posner, MD;  Location: [facility];  Service: Vascular;  Laterality: Right;  . AV FISTULA PLACEMENT Right 06/09/2019   Procedure: Right Arm Fistula Plication.;  Surgeon: Marty Heck, MD;  Location: [place];  Service: Vascular;  Laterality: Right;  . INCISION AND DRAINAGE ABSCESS Right 03/09/2017   Procedure: INCISION AND DRAINAGE Right Scrotal Abscess;  Surgeon: Franchot Gallo, MD;  Location: [place];  Service: Urology;  Laterality: Right;  . THROMBECTOMY Right ~ 2016   "AV fistula clotted off"   Family History Family History  Problem Relation Age of Onset  . Cancer Mother   . Hypertension Mother   . COPD Father   . Hypertension Father   . Diabetes Sister   . Hypertension Sister   . Diabetes Brother   . Hypertension Brother   . Stroke Neg Hx     Social History Social History   Tobacco Use  . Smoking status: Former Smoker    Packs/day: 0.10    Years: 25.00    Pack years: 2.50    Types: Cigarettes    Quit date: 12/20/2018    Years since quitting: 1.6  . Smokeless tobacco: Never Used  . Tobacco comment: "quitting"  Vaping Use  .  Vaping Use: Some days  Substance Use Topics  . Alcohol use: No    Alcohol/week: 0.0 standard drinks    Comment: 04/13/2017 "quit ~ 2014"  . Drug use: No   Allergies Patient has no known allergies.  Review of Systems Review of Systems  Physical Exam Vital Signs  I have reviewed the triage vital signs Ht 5\' 9"  (1.753 m)   Wt 121.8 kg   BMI 39.65 kg/m   Physical Exam Vitals and nursing note reviewed.  Constitutional:      General: He is in acute distress.     Appearance: He is well-developed and well-nourished. He is not diaphoretic.     Comments: King airway in place. Lucas device active Defib pad on  Left EJ Right Tib IO  HENT:     Head: Normocephalic and atraumatic.     Nose: Nose normal.  Eyes:     General: No scleral icterus.       Right eye: No discharge.        Left eye: No discharge.     Extraocular Movements: EOM normal.     Conjunctiva/sclera: Conjunctivae normal.     Pupils:     Right eye: Pupil is reactive.     Left eye: Pupil is reactive.  Cardiovascular:     Heart sounds: No murmur heard. No friction rub. No gallop.      Comments: pulseless Pulmonary:     Comments: Coarse lung sounds throughout Abdominal:     General: There is distension.     Palpations: Abdomen is soft.  Musculoskeletal:        General: No tenderness or edema.     Cervical back: Normal range of motion and neck supple.  Skin:    General: Skin is warm and dry.     Findings: No erythema or rash.  Neurological:     Mental Status: He is unresponsive.     GCS: GCS eye subscore is 1. GCS verbal subscore is 1. GCS motor subscore is 1.  Psychiatric:        Mood and Affect: Mood  and affect normal.     ED Results and Treatments Labs (all labs ordered are listed, but only abnormal results are displayed) Labs Reviewed - No data to display                                                                                                                       EKG  EKG  Interpretation  Date/Time:    Ventricular Rate:    PR Interval:    QRS Duration:   QT Interval:    QTC Calculation:   R Axis:     Text Interpretation:        Radiology No results found.  Pertinent labs & imaging results that were available during my care of the patient were reviewed by me and considered in my medical decision making (see chart for details).  Medications Ordered in ED Medications - No data to display                                                                                                                                  Procedures .1-3 Lead EKG Interpretation Performed by: Fatima Blank, MD Authorized by: Fatima Blank, MD     Interpretation: abnormal     ECG rate:  40   Rhythm: sinus bradycardia     Ectopy: aberrant and couplets     Conduction: normal    CPR  Date/Time: 08-12-2020 7:00 AM Performed by: Fatima Blank, MD Authorized by: Fatima Blank, MD  CPR Procedure Details:    ACLS/BLS initiated by EMS: Yes     CPR/ACLS performed in the ED: Yes   CPR performed via ACLS guidelines under my direct supervision.  See RN documentation for details including defibrillator use, medications, doses and timing. Comments:     Cardiopulmonary Resuscitation (CPR) Procedure Note Directed/Performed by: Fatima Blank I personally directed ancillary staff and/or performed CPR in an effort to regain return of spontaneous circulation and to maintain cardiac, neuro and systemic perfusion.   Ultrasound ED Echo  Date/Time: 2020/08/12 7:01 AM Performed by: Fatima Blank, MD Authorized by: Fatima Blank, MD   Procedure details:    Indications: cardiac arrest     Views: parasternal long axis view     Images: not archived (emergent)     Limitations:  Acoustic shadowing and positioning Findings:    Cardiac Activity: no cardiac activity   .Critical Care Performed  by: Fatima Blank,  MD Authorized by: Fatima Blank, MD   Critical care provider statement:    Critical care time (minutes):  30   Critical care time was exclusive of:  Separately billable procedures and treating other patients   Critical care was necessary to treat or prevent imminent or life-threatening deterioration of the following conditions:  Cardiac failure   Critical care was time spent personally by me on the following activities:  Discussions with consultants, evaluation of patient's response to treatment, examination of patient, ordering and performing treatments and interventions, ordering and review of laboratory studies, ordering and review of radiographic studies, pulse oximetry, re-evaluation of patient's condition, obtaining history from patient or surrogate and review of old charts Procedure Name: Intubation Date/Time: 2020/08/14 7:02 AM Performed by: Fatima Blank, MD Pre-anesthesia Checklist: Patient identified, Patient being monitored, Emergency Drugs available, Timeout performed and Suction available Oxygen Delivery Method: Non-rebreather mask Preoxygenation: Pre-oxygenation with 100% oxygen Induction Type: Rapid sequence Ventilation: Mask ventilation without difficulty Laryngoscope Size: Glidescope Grade View: Grade I Tube size: 7.5 mm Number of attempts: 1 Placement Confirmation: ETT inserted through vocal cords under direct vision,  CO2 detector and Breath sounds checked- equal and bilateral Secured at: 27 cm Tube secured with: ETT holder Difficulty Due To: Difficulty was anticipated       (including critical care time)  Medical Decision Making / ED Course I have reviewed the nursing notes for this encounter and the patient's prior records (if available in EHR or on provided paperwork).   Jcion Buddenhagen Daugherty was evaluated in Emergency Department on 2020/08/14 for the symptoms described in the history of present illness. He was evaluated in the context of the  global COVID-19 pandemic, which necessitated consideration that the patient might be at risk for infection with the SARS-CoV-2 virus that causes COVID-19. Institutional protocols and algorithms that pertain to the evaluation of patients at risk for COVID-19 are in a state of rapid change based on information released by regulatory bodies including the CDC and federal and state organizations. These policies and algorithms were followed during the patient's care in the ED.  [phone] pt was given calcium chloride 0532 epinepherine bristojet iv per k mujnnett  0533 pulse check no pulse asystolecpr continued p 147 in the truck.  0536 pulse check  No pulse  asystole0536 calcium chloride igm iv 0537     0530 calcium chloride iv 0540pulse check asystole 0540 pulse check asystole 0540defrib 0541 due to v-fib sod bicarb0573mag sulfate sod bicard iv  Pulse check no pulse 0543  defibed 0544 due to V-fib clalciumchloride iv pulse check asystole 0546 epie [id number] calcium iv  Pea 0550epin iv 0554pulse check 0556 calcium chloride iv  0559 pulse check  Pronounced deceased at [place] by dr cardoma  Clinical Course as of 2020/08/14 0658  Mon 08-14-2020  0600 Cardiac arrest. Trigger likely missed HD resulting in volume overload and hyperkalemia. After extensive resuscitative efforts there was only a brief episode of ROSC obtained. He became pulseless within 1 minute and ACLS was reinitiated. Despite additional rounds of high quality CPR and ACLS, ROSC was not able to be obtained again. He was pronounced dead at 5:59 [PC]  0628 Attempted to contact wife [name]) listed at home [phone]) and mobile [phone]). No answer. [PC]  3474 Death certificate completed. Natural causes. No ME referral needed. [PC]    Clinical Course User Index [PC] Cardama, Grayce Sessions, MD     Final Clinical Impression(s) / ED Diagnoses  Final diagnoses:  Cardiopulmonary arrest [facility])      This chart was dictated using voice recognition  software.  Despite best efforts to proofread,  errors can occur which can change the documentation meaning.       Fatima Blank, MD 2020/08/09 [phone]

## 2020-08-21 NOTE — ED Notes (Signed)
Family was given pt phone. Pt belongings in body bag with pt.

## 2020-08-21 NOTE — ED Notes (Signed)
[place] donor service notified no donor per the coodinator page [place] donor service number [id number]

## 2020-08-21 NOTE — ED Triage Notes (Signed)
No family has shown up his phone was with him and he called ems himself around 25am

## 2020-08-21 NOTE — ED Triage Notes (Signed)
The pt arrived with cprin progress gems   On the lucas he arrived with 0530. C/o chest pain  Hx hiv positive dialysis consistent with his treatments  Ems found rales and wheezes io placed b y ems rt ej by ems the pta was ambulatory to the unit  More sob and anxious when inside ambulance   c-pap placed pt getting more anxious   Stopped breathing c placed on c-pap then started bagging the pt on the way here   He camie on the stretcher  Being bagged on the [employer] airway that was removed and intubated no meds given for that he was being treated for approx 30 minutes before he arrived here  Ems gave 5 epis  Sod bicarb  The pt went from  Bradycardia sinus tach then to asystole

## 2020-08-21 NOTE — ED Triage Notes (Signed)
4734 pt was given calcium chloride 0532 epinepherine bristojet iv per k mujnnett  0533 pulse check no pulse asystolecpr continued p 147 in the truck.  0536 pulse check  No pulse  asystole0536 calcium chloride igm iv 0537     0530 calcium chloride iv 0540pulse check asystole 0540 pulse check asystole 0540defrib 0541 sod bicarb0569mag sulfate sod bicard iv  Pulse check no pulse 0543  defibed 0544 clalciumchloride iv pulse check asystole 0546 epie iv0547 calcium iv  Pea 0550epin iv 0554pulse check 0556 calcium chloride iv  0559 pulse check  Pronounced deceased at 0559 by dr Benay Pike

## 2020-08-21 NOTE — Progress Notes (Signed)
Chaplain received a call that a patient had died and his son was in Consult A. Chaplain went to bridge to get information on patient. Patient had passed from a heart attack and had no pulse when he was brought in by ambulance. Son was not able to view body because it had already been taken to morgue. Chaplain gave son placement card and emotional support. Patient's wife was upstairs on 5th with COVID. Chaplain went with son to his step-mother's room to tell her that her husband had passed. Son not able to see his step-mother.

## 2020-08-21 NOTE — ED Triage Notes (Signed)
Not a medical examiners case

## 2020-08-21 NOTE — ED Triage Notes (Signed)
The sons num ber [phone]  He has been contacted and he is on hi way here

## 2020-08-21 DEATH — deceased

## 2020-09-24 ENCOUNTER — Other Ambulatory Visit: Payer: Self-pay | Admitting: Internal Medicine

## 2020-09-24 DIAGNOSIS — F411 Generalized anxiety disorder: Secondary | ICD-10-CM

## 2020-09-26 ENCOUNTER — Ambulatory Visit: Payer: Medicare Other | Admitting: Internal Medicine

## 2020-10-16 ENCOUNTER — Other Ambulatory Visit (HOSPITAL_COMMUNITY): Payer: Self-pay

## 2020-10-23 ENCOUNTER — Ambulatory Visit: Payer: Medicare Other | Admitting: Internal Medicine

## 2023-08-10 NOTE — Telephone Encounter (Signed)
 error
